# Patient Record
Sex: Female | Born: 1940 | Race: White | Hispanic: No | Smoking: Never smoker
Health system: Southern US, Community
[De-identification: ages and names within clinical notes are randomized; demographics above are authoritative.]

## PROBLEM LIST (undated history)

## (undated) DIAGNOSIS — R112 Nausea with vomiting, unspecified: Secondary | ICD-10-CM

## (undated) DIAGNOSIS — I639 Cerebral infarction, unspecified: Secondary | ICD-10-CM

## (undated) DIAGNOSIS — I6381 Other cerebral infarction due to occlusion or stenosis of small artery: Secondary | ICD-10-CM

## (undated) DIAGNOSIS — Z8739 Personal history of other diseases of the musculoskeletal system and connective tissue: Secondary | ICD-10-CM

## (undated) DIAGNOSIS — M8580 Other specified disorders of bone density and structure, unspecified site: Secondary | ICD-10-CM

## (undated) DIAGNOSIS — K222 Esophageal obstruction: Secondary | ICD-10-CM

## (undated) DIAGNOSIS — K219 Gastro-esophageal reflux disease without esophagitis: Secondary | ICD-10-CM

## (undated) DIAGNOSIS — F418 Other specified anxiety disorders: Secondary | ICD-10-CM

## (undated) DIAGNOSIS — K5790 Diverticulosis of intestine, part unspecified, without perforation or abscess without bleeding: Secondary | ICD-10-CM

## (undated) DIAGNOSIS — M533 Sacrococcygeal disorders, not elsewhere classified: Secondary | ICD-10-CM

## (undated) DIAGNOSIS — N393 Stress incontinence (female) (male): Secondary | ICD-10-CM

## (undated) DIAGNOSIS — E785 Hyperlipidemia, unspecified: Secondary | ICD-10-CM

## (undated) DIAGNOSIS — E119 Type 2 diabetes mellitus without complications: Secondary | ICD-10-CM

## (undated) DIAGNOSIS — I10 Essential (primary) hypertension: Secondary | ICD-10-CM

## (undated) DIAGNOSIS — Z9889 Other specified postprocedural states: Secondary | ICD-10-CM

## (undated) DIAGNOSIS — G8929 Other chronic pain: Secondary | ICD-10-CM

## (undated) DIAGNOSIS — K589 Irritable bowel syndrome without diarrhea: Secondary | ICD-10-CM

## (undated) DIAGNOSIS — K922 Gastrointestinal hemorrhage, unspecified: Secondary | ICD-10-CM

## (undated) HISTORY — DX: Other cerebral infarction due to occlusion or stenosis of small artery: I63.81

## (undated) HISTORY — DX: Other chronic pain: G89.29

## (undated) HISTORY — DX: Other specified anxiety disorders: F41.8

## (undated) HISTORY — DX: Hyperlipidemia, unspecified: E78.5

## (undated) HISTORY — DX: Other specified disorders of bone density and structure, unspecified site: M85.80

## (undated) HISTORY — DX: Personal history of other diseases of the musculoskeletal system and connective tissue: Z87.39

## (undated) HISTORY — DX: Gastro-esophageal reflux disease without esophagitis: K21.9

## (undated) HISTORY — DX: Essential (primary) hypertension: I10

## (undated) HISTORY — DX: Gastrointestinal hemorrhage, unspecified: K92.2

## (undated) HISTORY — PX: ABDOMINAL HYSTERECTOMY: SHX81

## (undated) HISTORY — DX: Esophageal obstruction: K22.2

## (undated) HISTORY — PX: COLONOSCOPY: SHX174

## (undated) HISTORY — DX: Stress incontinence (female) (male): N39.3

## (undated) HISTORY — PX: CARDIAC CATHETERIZATION: SHX172

## (undated) HISTORY — DX: Diverticulosis of intestine, part unspecified, without perforation or abscess without bleeding: K57.90

## (undated) HISTORY — DX: Type 2 diabetes mellitus without complications: E11.9

## (undated) HISTORY — DX: Sacrococcygeal disorders, not elsewhere classified: M53.3

---

## 1997-09-15 LAB — HM COLONOSCOPY

## 1998-08-10 ENCOUNTER — Inpatient Hospital Stay (HOSPITAL_COMMUNITY): Admission: EM | Admit: 1998-08-10 | Discharge: 1998-08-13 | Payer: Self-pay | Admitting: *Deleted

## 1998-08-28 ENCOUNTER — Encounter: Admission: RE | Admit: 1998-08-28 | Discharge: 1998-11-26 | Payer: Self-pay | Admitting: Internal Medicine

## 1999-12-31 ENCOUNTER — Encounter: Admission: RE | Admit: 1999-12-31 | Discharge: 1999-12-31 | Payer: Self-pay | Admitting: Obstetrics and Gynecology

## 1999-12-31 ENCOUNTER — Encounter: Payer: Self-pay | Admitting: Obstetrics and Gynecology

## 2000-01-03 ENCOUNTER — Encounter: Admission: RE | Admit: 2000-01-03 | Discharge: 2000-01-03 | Payer: Self-pay | Admitting: Obstetrics and Gynecology

## 2000-01-03 ENCOUNTER — Encounter: Payer: Self-pay | Admitting: Obstetrics and Gynecology

## 2000-08-19 ENCOUNTER — Other Ambulatory Visit: Admission: RE | Admit: 2000-08-19 | Discharge: 2000-08-19 | Payer: Self-pay | Admitting: Obstetrics and Gynecology

## 2001-01-05 ENCOUNTER — Encounter: Admission: RE | Admit: 2001-01-05 | Discharge: 2001-01-05 | Payer: Self-pay | Admitting: Internal Medicine

## 2001-01-05 ENCOUNTER — Encounter: Payer: Self-pay | Admitting: Internal Medicine

## 2002-01-19 ENCOUNTER — Encounter: Payer: Self-pay | Admitting: Internal Medicine

## 2002-01-19 ENCOUNTER — Encounter: Admission: RE | Admit: 2002-01-19 | Discharge: 2002-01-19 | Payer: Self-pay | Admitting: Internal Medicine

## 2003-02-03 ENCOUNTER — Other Ambulatory Visit: Admission: RE | Admit: 2003-02-03 | Discharge: 2003-02-03 | Payer: Self-pay | Admitting: Obstetrics and Gynecology

## 2003-04-19 ENCOUNTER — Encounter: Admission: RE | Admit: 2003-04-19 | Discharge: 2003-04-19 | Payer: Self-pay | Admitting: Internal Medicine

## 2003-04-19 ENCOUNTER — Encounter: Payer: Self-pay | Admitting: Internal Medicine

## 2004-02-16 ENCOUNTER — Emergency Department (HOSPITAL_COMMUNITY): Admission: EM | Admit: 2004-02-16 | Discharge: 2004-02-16 | Payer: Self-pay | Admitting: Emergency Medicine

## 2004-02-16 IMAGING — CR DG ANKLE COMPLETE 3+V*L*
3 series · 3 of 3 positions shown · non-contrast
Comparison: none

CLINICAL DATA: Status post fall with ankle pain.
 LEFT ANKLE, THREE VIEW, [DATE] 
 There is an oblique mildly comminuted fracture noted through the distal left fibula.  Fracture is also noted through the medial malleolus, which is mildly displaced.  Probable posterior malleolar fracture present as well.  
 IMPRESSION 
 Trimalleolar fracture.

[view not recorded (1 of 3)]
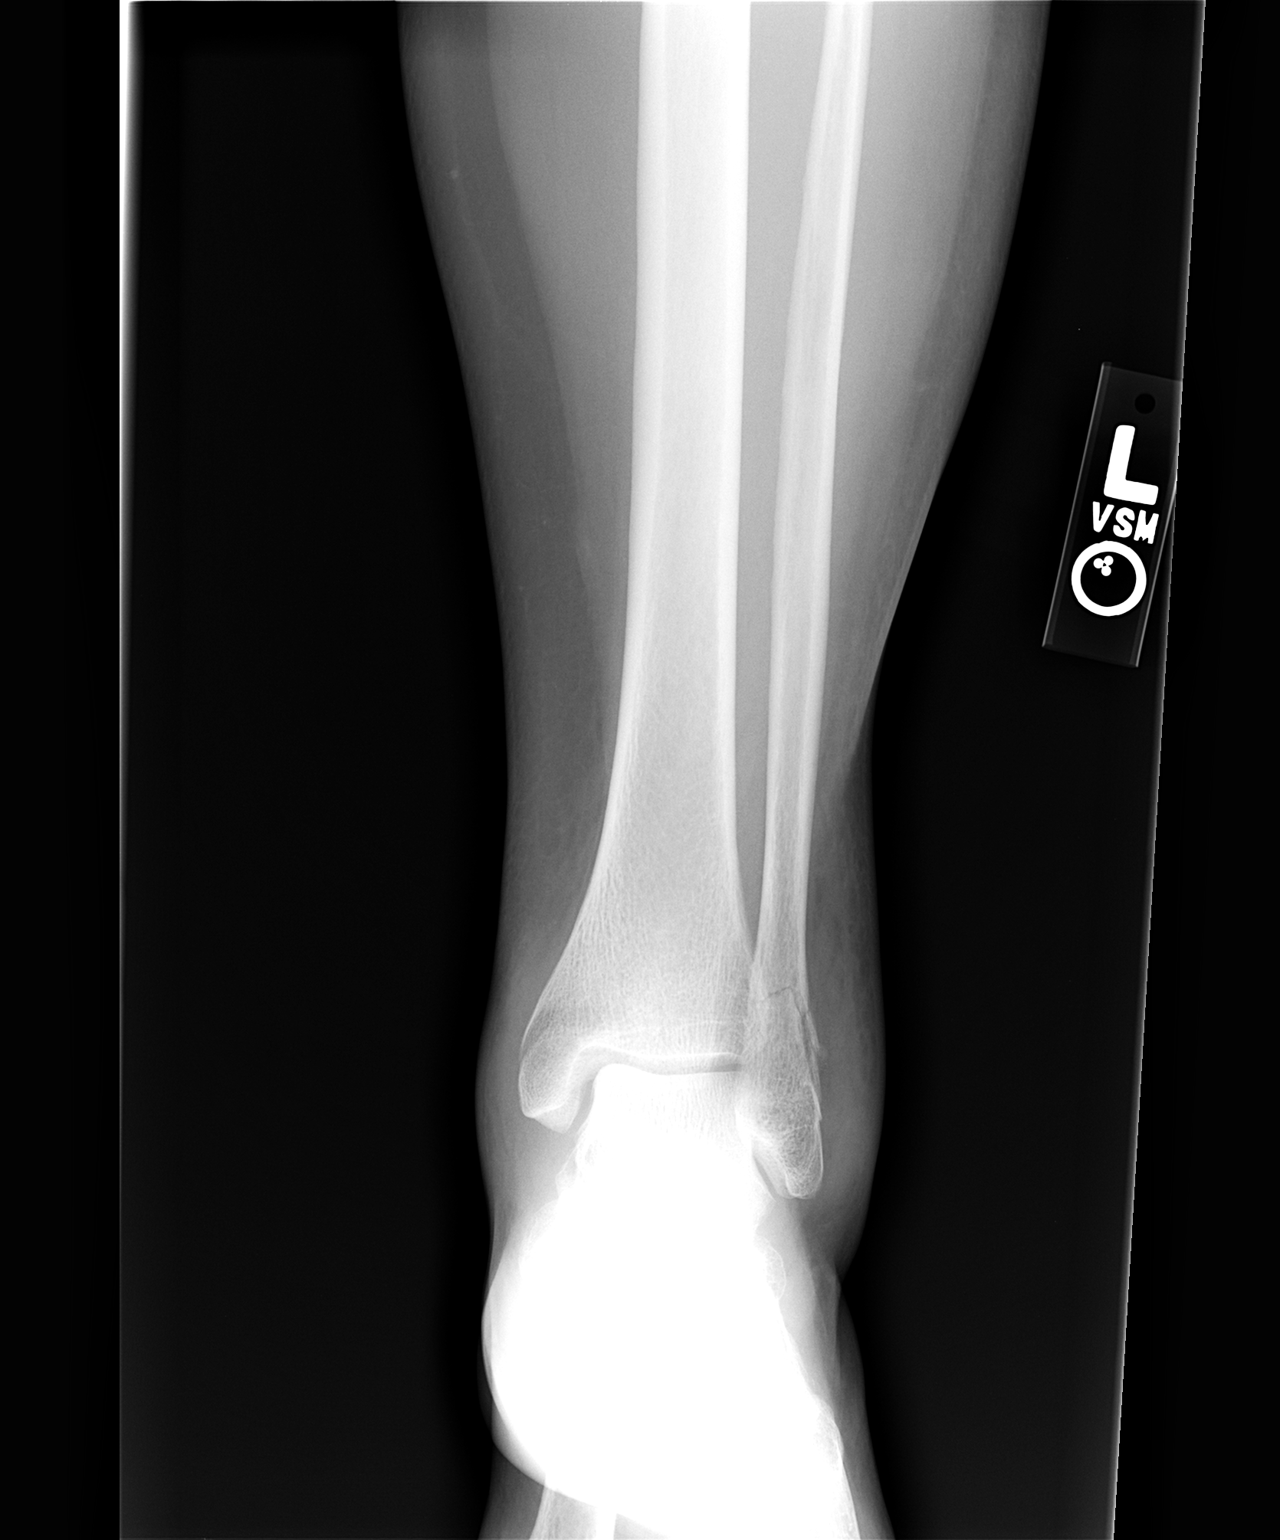

[view not recorded (2 of 3)]
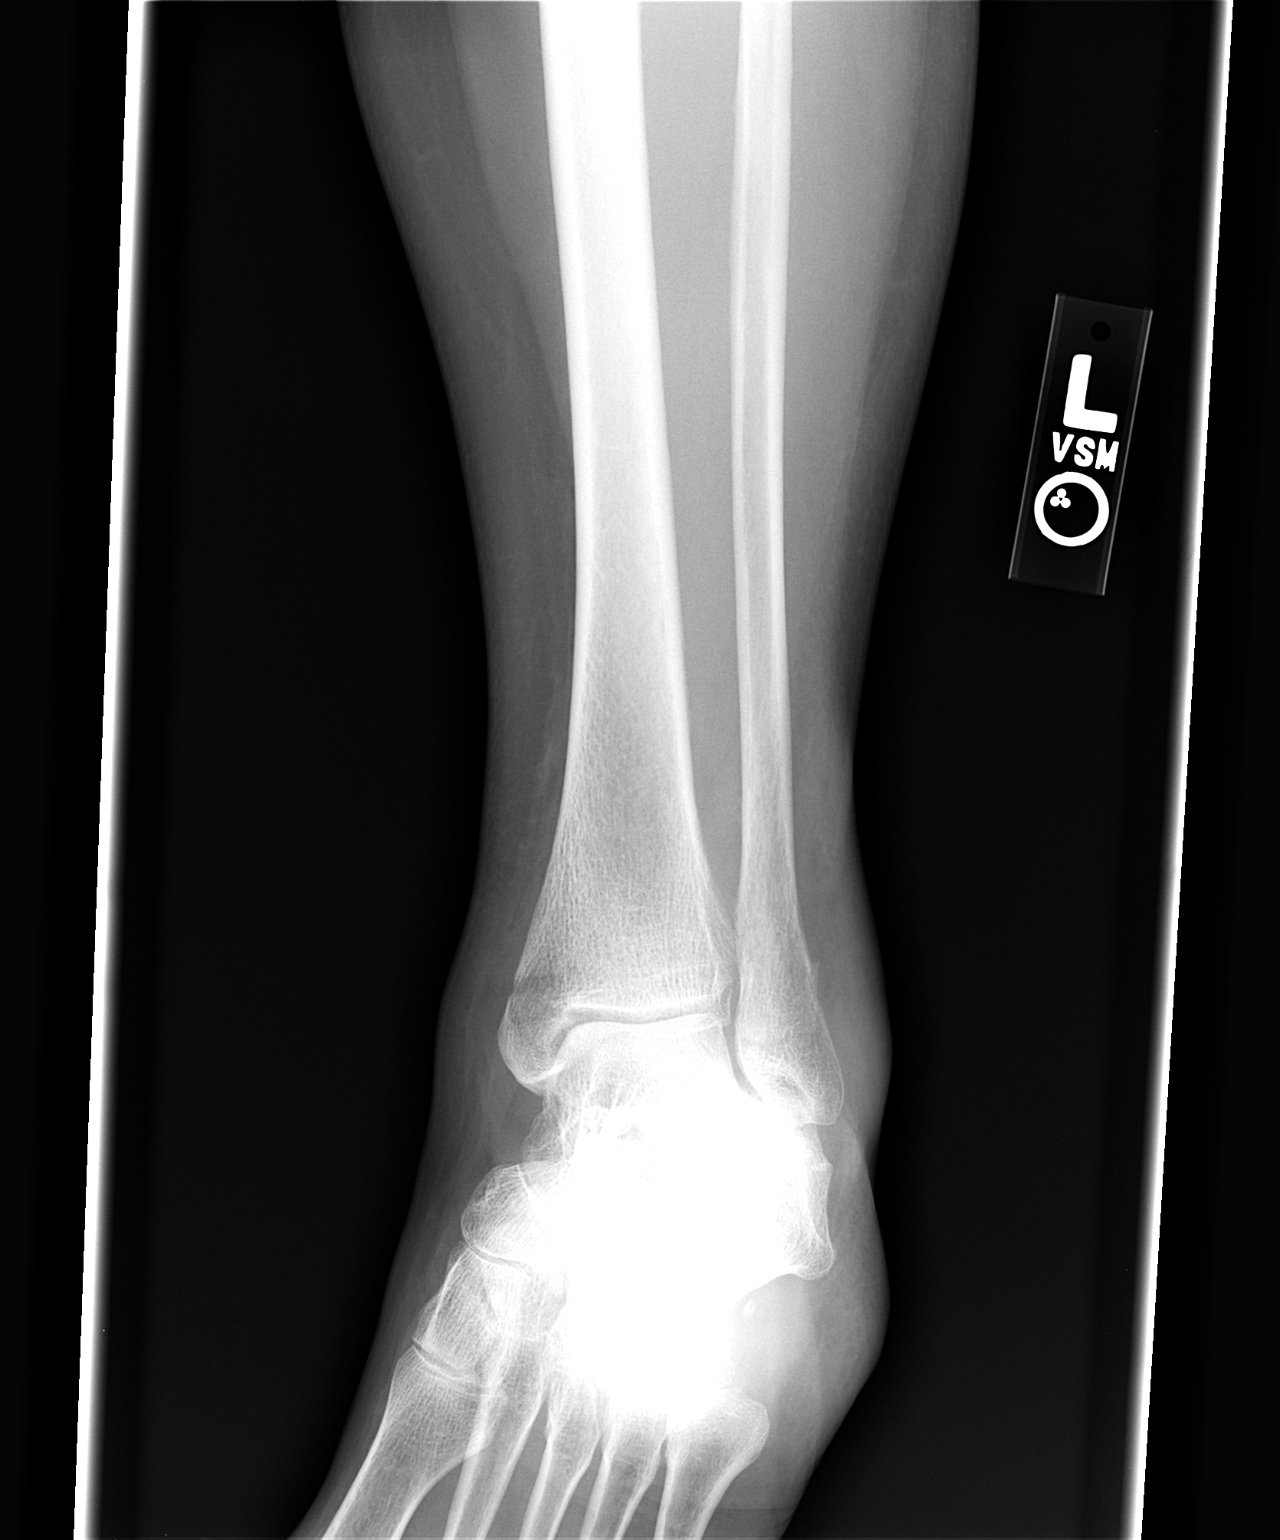

[view not recorded (3 of 3)]
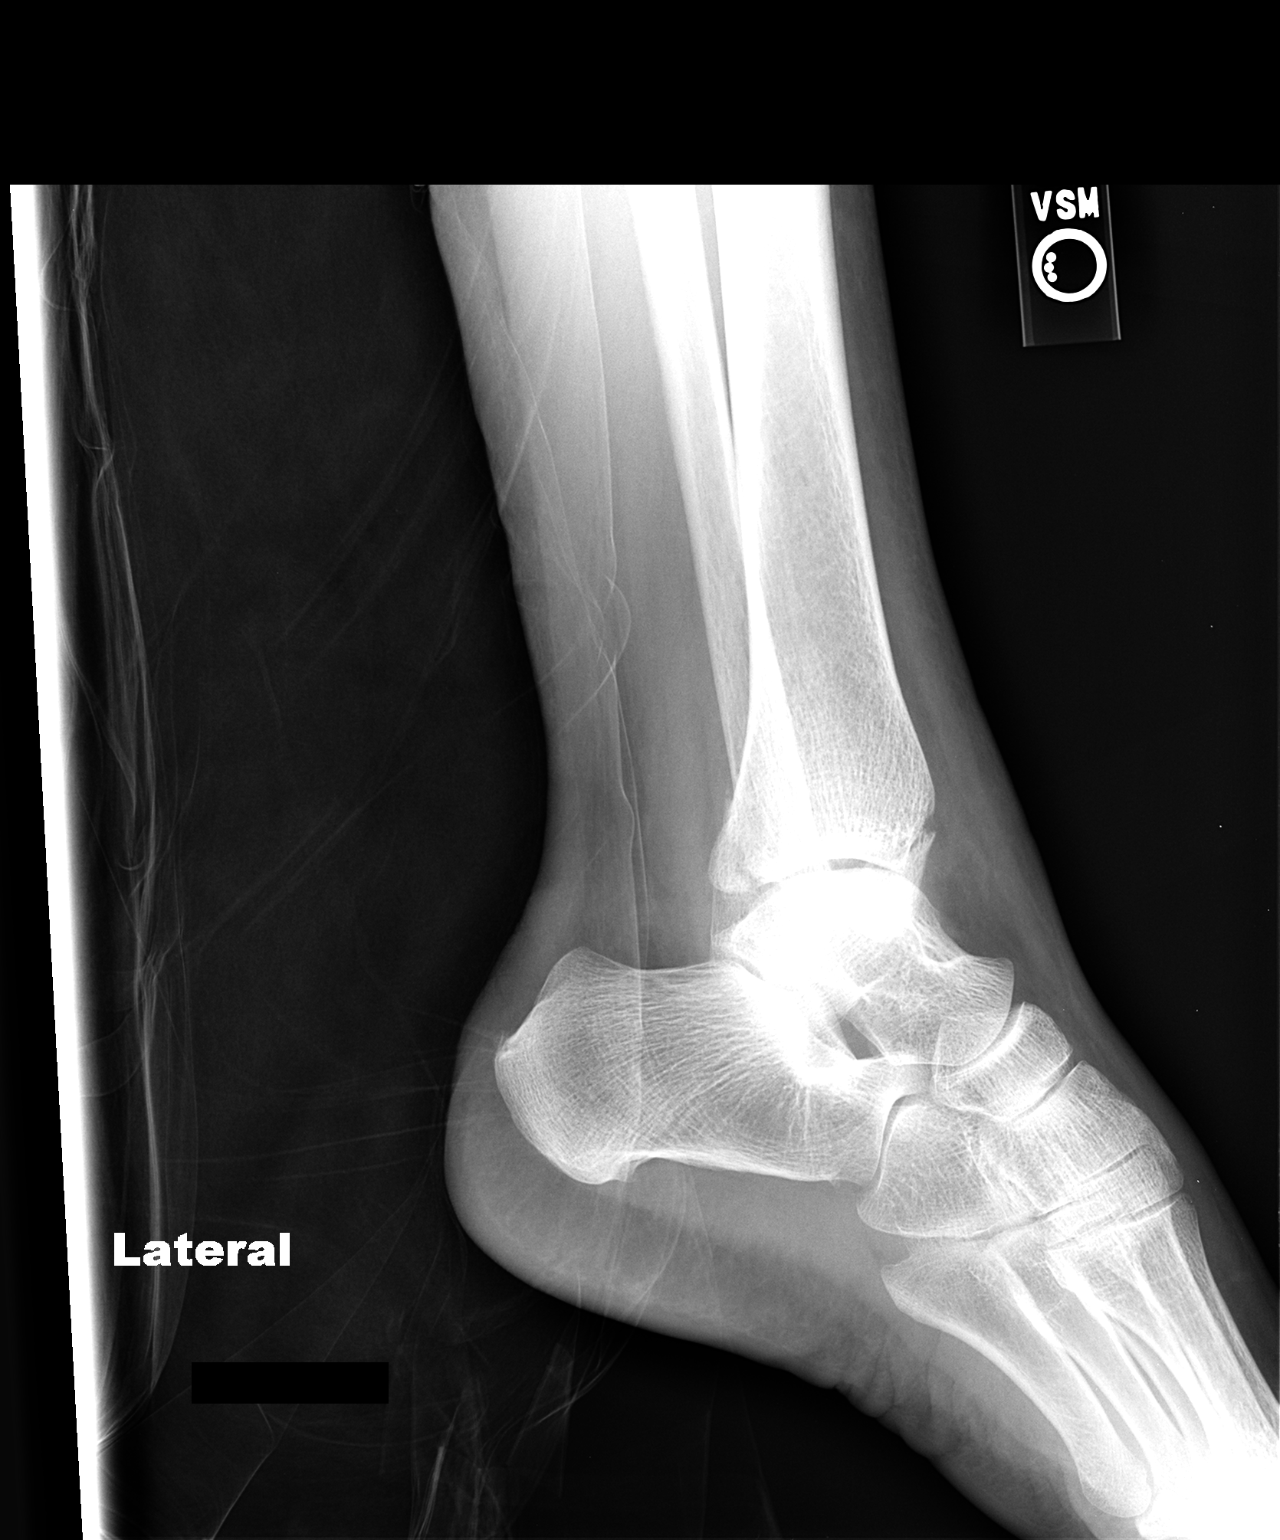

[3 of 3 positions shown; findings below may reference images not displayed]

## 2004-07-16 ENCOUNTER — Encounter: Admission: RE | Admit: 2004-07-16 | Discharge: 2004-07-16 | Payer: Self-pay | Admitting: Internal Medicine

## 2004-08-13 ENCOUNTER — Ambulatory Visit: Payer: Self-pay | Admitting: Internal Medicine

## 2004-09-12 ENCOUNTER — Ambulatory Visit: Payer: Self-pay | Admitting: Internal Medicine

## 2004-10-08 ENCOUNTER — Ambulatory Visit: Payer: Self-pay | Admitting: Internal Medicine

## 2005-04-10 ENCOUNTER — Ambulatory Visit: Payer: Self-pay | Admitting: Internal Medicine

## 2005-10-21 ENCOUNTER — Encounter: Admission: RE | Admit: 2005-10-21 | Discharge: 2005-10-21 | Payer: Self-pay | Admitting: Internal Medicine

## 2006-01-14 ENCOUNTER — Encounter: Payer: Self-pay | Admitting: *Deleted

## 2006-08-13 ENCOUNTER — Ambulatory Visit: Payer: Self-pay | Admitting: Internal Medicine

## 2006-12-10 ENCOUNTER — Ambulatory Visit: Payer: Self-pay | Admitting: Internal Medicine

## 2006-12-10 LAB — CONVERTED CEMR LAB
ALT: 25 units/L (ref 0–40)
Alkaline Phosphatase: 77 units/L (ref 39–117)
Basophils Absolute: 0.1 10*3/uL (ref 0.0–0.1)
Basophils Relative: 0.7 % (ref 0.0–1.0)
Bilirubin, Direct: 0.1 mg/dL (ref 0.0–0.3)
Calcium: 9.3 mg/dL (ref 8.4–10.5)
Eosinophils Relative: 3.3 % (ref 0.0–5.0)
GFR calc non Af Amer: 59 mL/min
HCT: 41.4 % (ref 36.0–46.0)
Hemoglobin: 14.1 g/dL (ref 12.0–15.0)
Hgb A1c MFr Bld: 6.3 % — ABNORMAL HIGH (ref 4.6–6.0)
MCHC: 34 g/dL (ref 30.0–36.0)
Neutro Abs: 8.8 10*3/uL — ABNORMAL HIGH (ref 1.4–7.7)
Platelets: 288 10*3/uL (ref 150–400)
Potassium: 3.3 meq/L — ABNORMAL LOW (ref 3.5–5.1)
RBC: 4.67 M/uL (ref 3.87–5.11)
Sodium: 138 meq/L (ref 135–145)
TSH: 1.98 microintl units/mL (ref 0.35–5.50)
Total Bilirubin: 0.9 mg/dL (ref 0.3–1.2)
Total Protein: 7.5 g/dL (ref 6.0–8.3)
WBC: 13.5 10*3/uL — ABNORMAL HIGH (ref 4.5–10.5)

## 2007-04-06 ENCOUNTER — Ambulatory Visit: Payer: Self-pay | Admitting: Internal Medicine

## 2007-04-06 LAB — CONVERTED CEMR LAB: Blood Glucose, Fingerstick: 146

## 2007-06-02 ENCOUNTER — Encounter: Admission: RE | Admit: 2007-06-02 | Discharge: 2007-06-02 | Payer: Self-pay | Admitting: Obstetrics and Gynecology

## 2007-06-10 ENCOUNTER — Ambulatory Visit: Payer: Self-pay | Admitting: Internal Medicine

## 2007-06-10 LAB — CONVERTED CEMR LAB
Cholesterol: 237 mg/dL (ref 0–200)
Direct LDL: 131.4 mg/dL

## 2007-07-29 ENCOUNTER — Ambulatory Visit: Payer: Self-pay | Admitting: Internal Medicine

## 2007-07-29 LAB — CONVERTED CEMR LAB
AST: 27 units/L (ref 0–37)
Cholesterol: 187 mg/dL (ref 0–200)

## 2007-11-09 ENCOUNTER — Ambulatory Visit: Payer: Self-pay | Admitting: Internal Medicine

## 2007-11-09 DIAGNOSIS — E119 Type 2 diabetes mellitus without complications: Secondary | ICD-10-CM

## 2007-11-09 DIAGNOSIS — I10 Essential (primary) hypertension: Secondary | ICD-10-CM | POA: Insufficient documentation

## 2007-11-09 DIAGNOSIS — J4520 Mild intermittent asthma, uncomplicated: Secondary | ICD-10-CM

## 2007-11-09 DIAGNOSIS — E785 Hyperlipidemia, unspecified: Secondary | ICD-10-CM | POA: Insufficient documentation

## 2007-11-09 DIAGNOSIS — E1129 Type 2 diabetes mellitus with other diabetic kidney complication: Secondary | ICD-10-CM | POA: Insufficient documentation

## 2007-11-09 HISTORY — DX: Mild intermittent asthma, uncomplicated: J45.20

## 2007-11-09 HISTORY — DX: Essential (primary) hypertension: I10

## 2007-11-09 HISTORY — DX: Type 2 diabetes mellitus without complications: E11.9

## 2007-11-09 LAB — CONVERTED CEMR LAB
AST: 22 units/L (ref 0–37)
Rapid Strep: NEGATIVE

## 2007-11-23 ENCOUNTER — Ambulatory Visit: Payer: Self-pay | Admitting: Internal Medicine

## 2008-03-28 ENCOUNTER — Ambulatory Visit: Payer: Self-pay | Admitting: Internal Medicine

## 2008-03-28 LAB — CONVERTED CEMR LAB: Hgb A1c MFr Bld: 6.5 % — ABNORMAL HIGH (ref 4.6–6.0)

## 2008-07-03 ENCOUNTER — Ambulatory Visit: Payer: Self-pay | Admitting: Internal Medicine

## 2008-08-23 ENCOUNTER — Ambulatory Visit: Payer: Self-pay | Admitting: Internal Medicine

## 2008-08-23 LAB — CONVERTED CEMR LAB
Ketones, urine, test strip: NEGATIVE
Urobilinogen, UA: NEGATIVE
WBC Urine, dipstick: NEGATIVE
pH: 7.5

## 2009-02-08 ENCOUNTER — Encounter: Payer: Self-pay | Admitting: Internal Medicine

## 2009-11-28 ENCOUNTER — Emergency Department (HOSPITAL_COMMUNITY): Admission: EM | Admit: 2009-11-28 | Discharge: 2009-11-28 | Payer: Self-pay | Admitting: Family Medicine

## 2009-11-28 ENCOUNTER — Ambulatory Visit: Payer: Self-pay | Admitting: Cardiology

## 2009-11-28 ENCOUNTER — Inpatient Hospital Stay (HOSPITAL_COMMUNITY): Admission: EM | Admit: 2009-11-28 | Discharge: 2009-11-30 | Payer: Self-pay | Admitting: Emergency Medicine

## 2009-11-28 IMAGING — CR DG CHEST 1V PORT
1 series · 1 of 1 positions shown · non-contrast
Comparison: None.

CLINICAL DATA: Chest pain

PORTABLE CHEST - 1 VIEW

[AP]
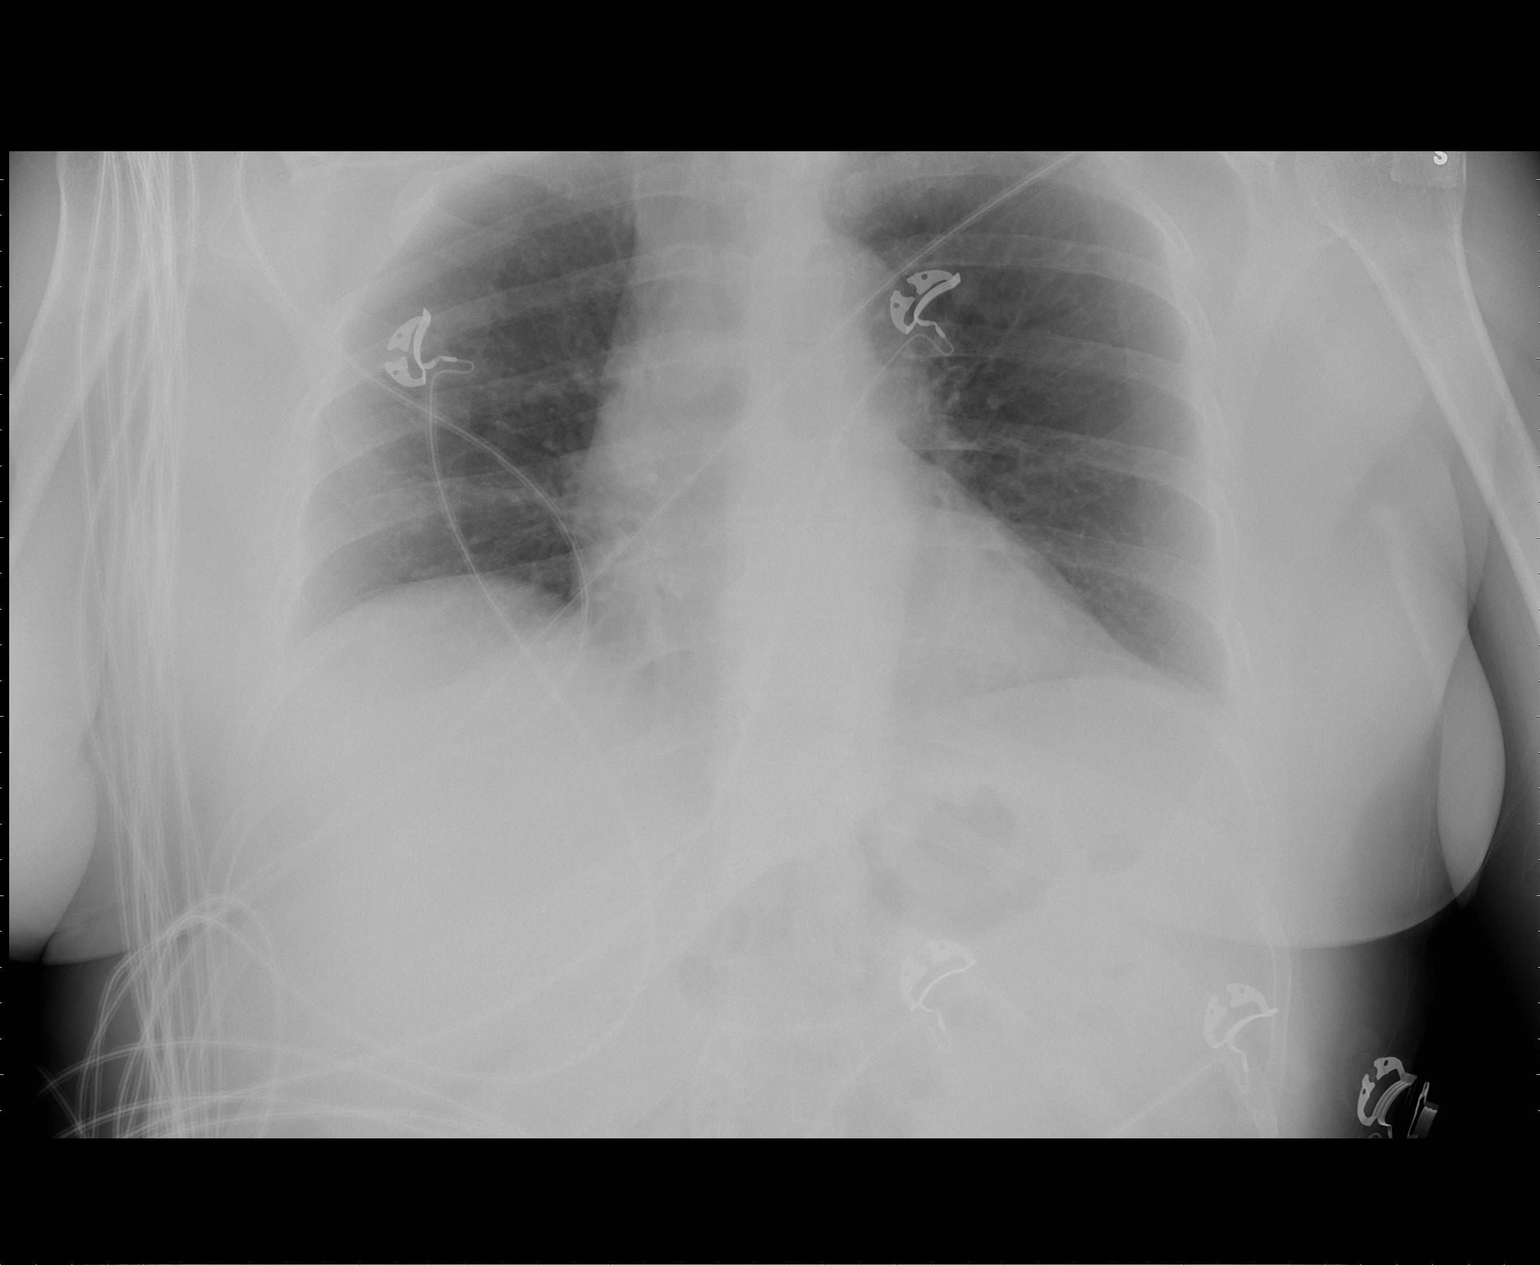

[1 of 1 positions shown; findings below may reference images not displayed]

FINDINGS: Normal mediastinum and cardiac silhouette.  Exam is
lordotic.  No evidence effusion, infiltrate, or pneumothorax
IMPRESSION: No acute cardiopulmonary process.

## 2009-11-28 IMAGING — CT CT ANGIO CHEST
2 of 7 series · 17 of 46 positions shown · IV contrast (APPLIED)
Comparison: Chest radiographs from the same day.

CTA CHEST

CLINICAL DATA: 68-year-old female with chest pain, shortness of
breath, diaphoresis, left-sided pain and weakness.

CT ANGIOGRAPHY CHEST, ABDOMEN AND PELVIS
TECHNIQUE: Multidetector CT imaging through the chest, abdomen and
pelvis was performed using the standard protocol during bolus
administration of intravenous contrast.  Multiplanar reconstructed
images including MIPs were obtained and reviewed to evaluate the
vascular anatomy.
Contrast: 100 ml Omnipaque 350.

[Series 5: dissection 2.0 st · axial · 0.66mm/px · z∈[+1179,+1723]mm · 14 of 308 slices shown]
[im 18/308  lung]
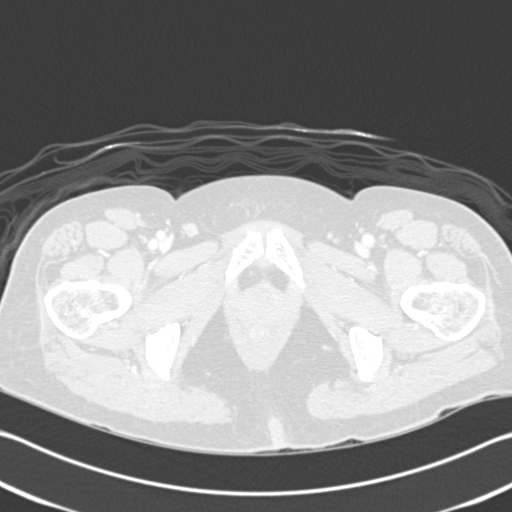
[im 35/308  soft-tissue]
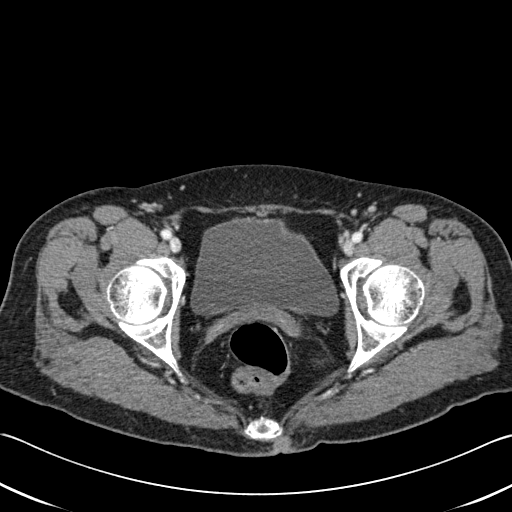
[im 69/308  lung]
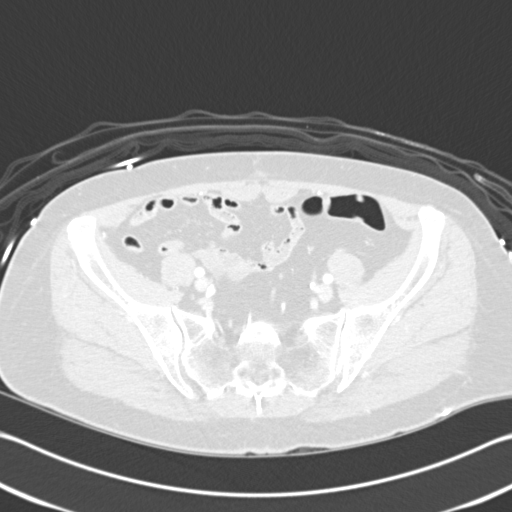
[im 86/308  soft-tissue]
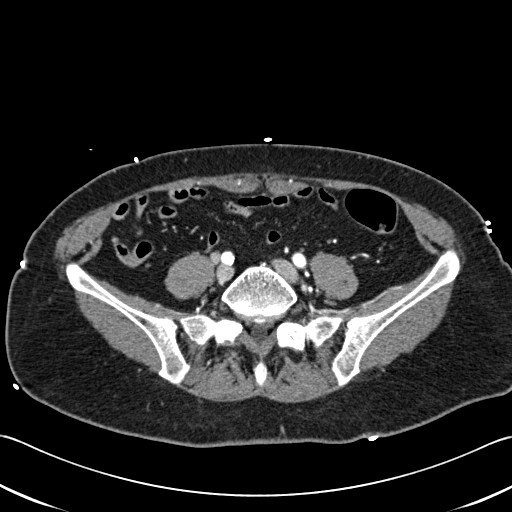
[im 103/308  lung]
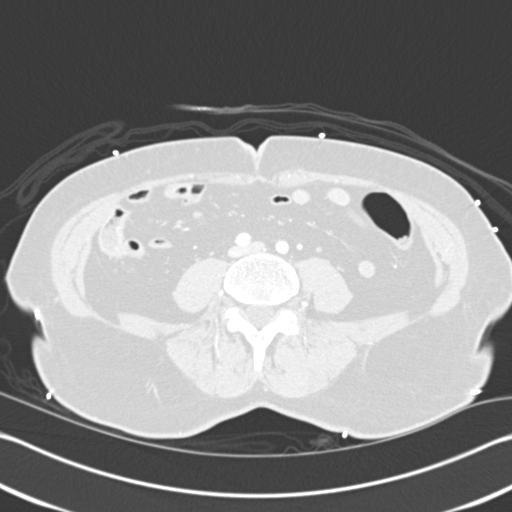
[im 120/308  soft-tissue]
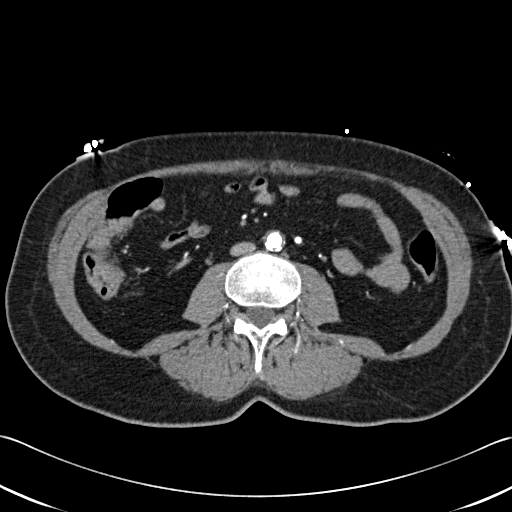
[im 137/308  lung]
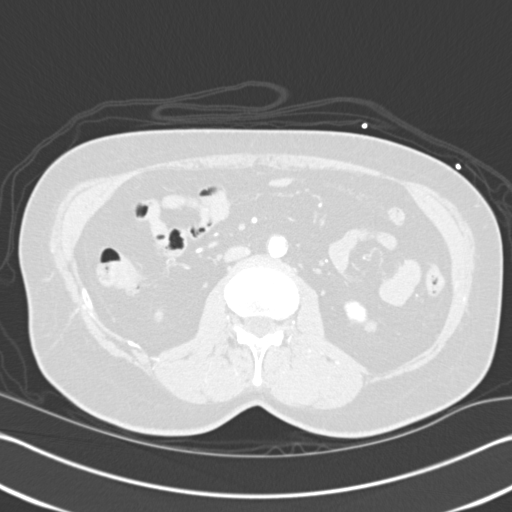
[im 171/308  soft-tissue]
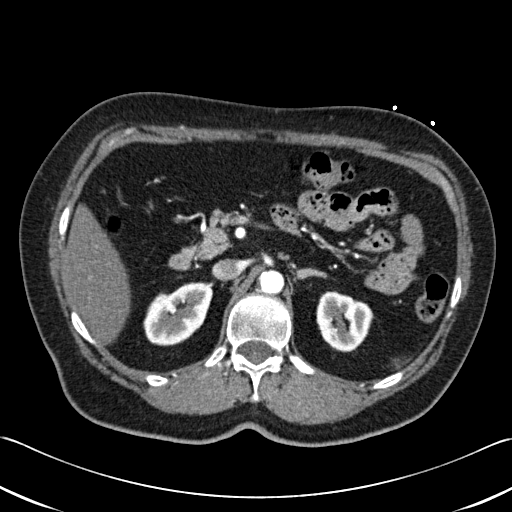
[im 188/308  lung]
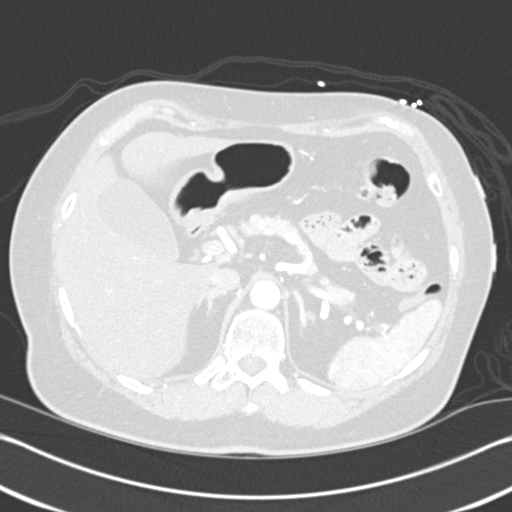
[im 205/308  soft-tissue]
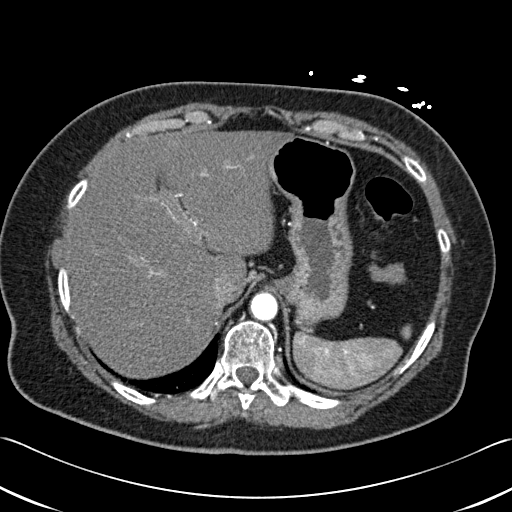
[im 222/308  lung]
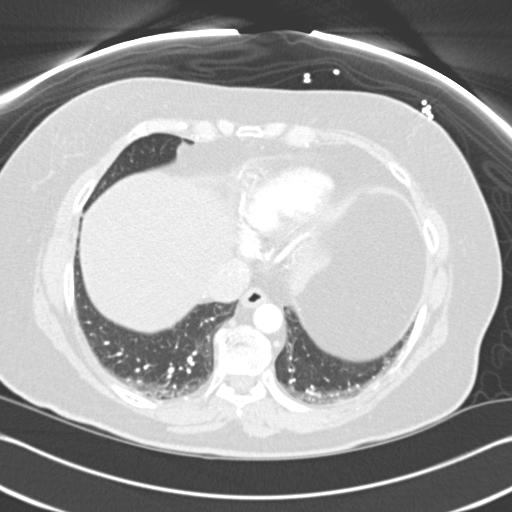
[im 239/308  soft-tissue]
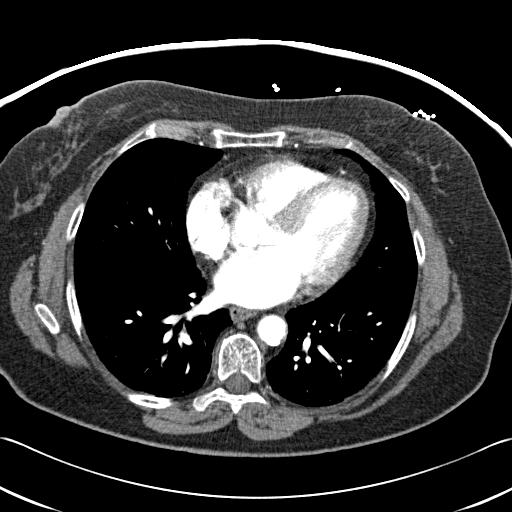
[im 273/308  lung]
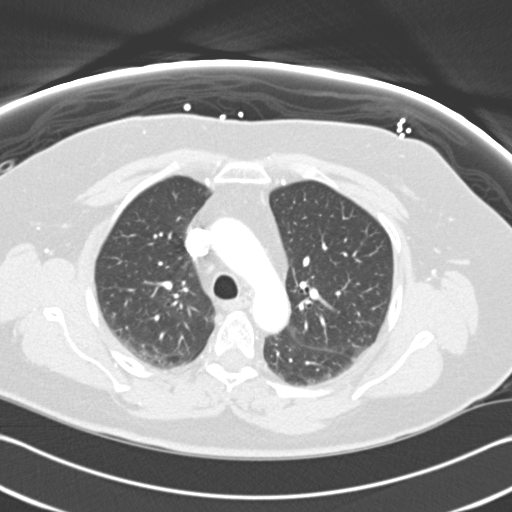
[im 290/308  soft-tissue]
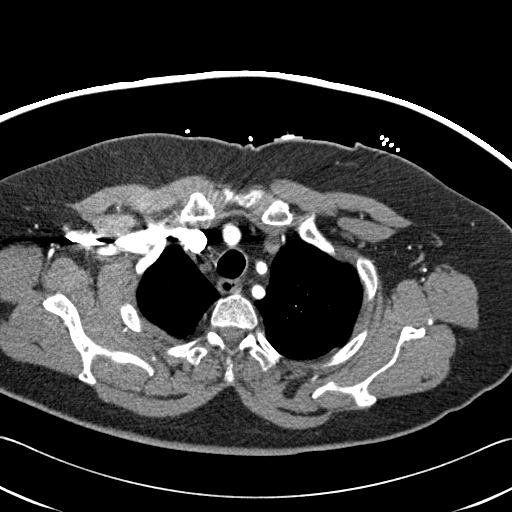

[Series 602: cor mpr · coronal · 1.20mm/px · 3 of 114 slices shown]
[im 29/114  soft-tissue]
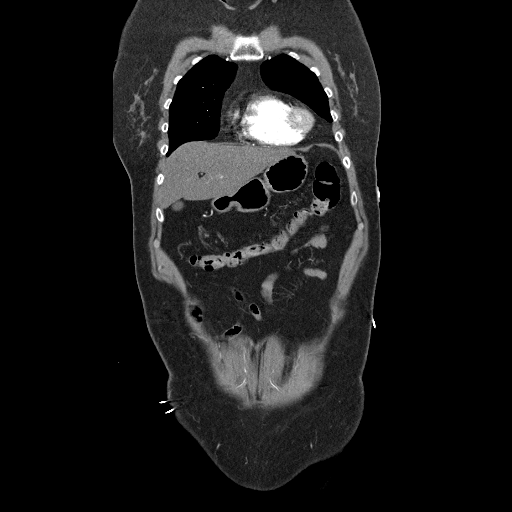
[im 57/114  soft-tissue]
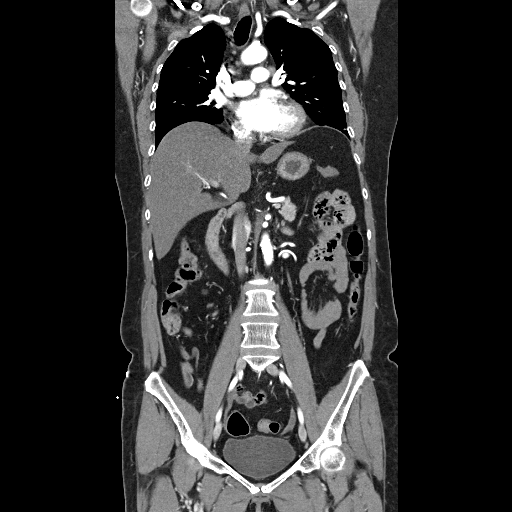
[im 85/114  soft-tissue]
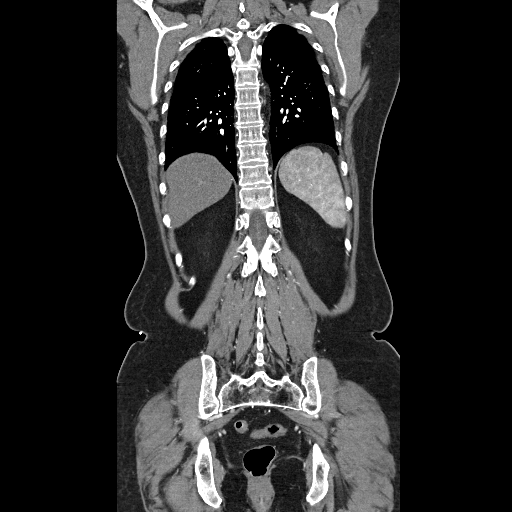

[17 of 46 positions shown; findings below may reference images not displayed]

FINDINGS: Major airways are patent.  There is bilateral dependent
atelectasis.  No airspace consolidation.  No pneumothorax, pleural
or pericardial effusion. No acute osseous abnormality identified.
Visualized thoracic inlet is within normal limits.  There is an
increase number of small mediastinal lymph nodes, most 7 mm in
short axis or less.  Increased bilateral hilar lymph nodes as well,
maximal on the right measuring up to 16 mm.  No axillary
lymphadenopathy.

Vascular Findings: The thoracic aorta is normal aside from
atherosclerosis with both soft and calcified plaque seen
predominately in the arch and descending segment.  Visualized great
vessels are patent and within normal limits.  There is also
adequate contrast timing in the pulmonary artery system.  No
pulmonary artery filling defect is identified.

 Review of the MIP images confirms the above findings.
IMPRESSION: 1.  Negative thoracic aorta aside from atherosclerosis. No evidence
of acute pulmonary embolus.
2.  Nonspecific mediastinal and hilar lymphadenopathy, maximal at
the right hilum.  No suspicious lung lesion is identified.
Differential considerations include inflammatory process such as
sarcoidosis, lymphoproliferative disorder, and less likely
metastatic disease.

CTA ABDOMEN
FINDINGS: No acute osseous abnormality identified.  Intermittent
diverticulosis of the colon, maximal at the hepatic flexure.  No
dilated small bowel.  Stomach and duodenum are within normal
limits.  Hepatic steatosis.  No focal liver lesion.  Gallbladder,
spleen, pancreas, and adrenal glands are within normal limits.  The
kidneys are normal for age with incidental simple right renal mid
pole cyst.  There are very small upper abdominal lymph nodes which
do not appear abnormally increased in number or size.  No free
fluid or focal inflammatory changes.

Vascular Findings: There is atherosclerosis of the abdominal aorta.
No dissection or aneurysmal enlargement.  Major branches including
the celiac, SMA, bilateral main renal arteries, and IMA are patent.
There is an accessory left renal artery.

 Review of the MIP images confirms the above findings.
IMPRESSION: 1.  Negative abdominal aorta aside from atherosclerosis.
2.  No lymphadenopathy or acute findings in the abdomen.

CTA PELVIS
FINDINGS: Distal large and small bowel are within normal limits.
Bladder is unremarkable.  Uterus is surgically absent.  Adnexa are
within normal limits.  No free fluid or lymphadenopathy. No acute
osseous abnormality identified.

Vascular Findings: Atherosclerosis at the aortoiliac bifurcation
continues into the iliac arteries but is less pronounced in the
external iliac arteries bilaterally.  There is atherosclerosis of
the right common femoral artery.  Visualized bilateral superficial
and deep femoral arteries are patent.

 Review of the MIP images confirms the above findings.
IMPRESSION: 1.  Negative visualized pelvic and femoral arteries aside from
atherosclerosis.
2.  No lymphadenopathy or acute findings in the pelvis.

## 2009-11-30 ENCOUNTER — Encounter: Payer: Self-pay | Admitting: Cardiology

## 2009-12-18 ENCOUNTER — Telehealth: Payer: Self-pay | Admitting: Internal Medicine

## 2009-12-19 ENCOUNTER — Ambulatory Visit: Payer: Self-pay | Admitting: Internal Medicine

## 2009-12-19 LAB — CONVERTED CEMR LAB
ALT: 25 units/L (ref 0–35)
Albumin: 4 g/dL (ref 3.5–5.2)
BUN: 26 mg/dL — ABNORMAL HIGH (ref 6–23)
Basophils Absolute: 0.1 10*3/uL (ref 0.0–0.1)
Basophils Relative: 1 % (ref 0.0–3.0)
Bilirubin, Direct: 0.1 mg/dL (ref 0.0–0.3)
Cholesterol: 159 mg/dL (ref 0–200)
Creatinine, Ser: 1.5 mg/dL — ABNORMAL HIGH (ref 0.4–1.2)
Eosinophils Absolute: 0.8 10*3/uL — ABNORMAL HIGH (ref 0.0–0.7)
Eosinophils Relative: 10.5 % — ABNORMAL HIGH (ref 0.0–5.0)
GFR calc non Af Amer: 36.74 mL/min (ref >60)
Glucose, Bld: 123 mg/dL — ABNORMAL HIGH (ref 70–99)
Leukocytes, UA: NEGATIVE
Lymphs Abs: 2.7 10*3/uL (ref 0.7–4.0)
MCHC: 34.1 g/dL (ref 30.0–36.0)
Monocytes Absolute: 0.6 10*3/uL (ref 0.1–1.0)
Monocytes Relative: 8.2 % (ref 3.0–12.0)
Nitrite: NEGATIVE
Platelets: 225 10*3/uL (ref 150.0–400.0)
Potassium: 3.5 meq/L (ref 3.5–5.1)
Sodium: 140 meq/L (ref 135–145)
Total CHOL/HDL Ratio: 3
Total Protein, Urine: NEGATIVE mg/dL
Triglycerides: 179 mg/dL — ABNORMAL HIGH (ref 0.0–149.0)
pH: 6.5 (ref 5.0–8.0)

## 2009-12-25 ENCOUNTER — Ambulatory Visit: Payer: Self-pay | Admitting: Internal Medicine

## 2009-12-25 LAB — HM DIABETES FOOT EXAM

## 2009-12-25 LAB — HM DIABETES EYE EXAM: HM Diabetic Eye Exam: NORMAL

## 2010-04-01 ENCOUNTER — Encounter (INDEPENDENT_AMBULATORY_CARE_PROVIDER_SITE_OTHER): Payer: Self-pay | Admitting: *Deleted

## 2010-06-26 ENCOUNTER — Ambulatory Visit: Payer: Self-pay | Admitting: Internal Medicine

## 2010-06-26 LAB — CONVERTED CEMR LAB
Albumin: 4.2 g/dL (ref 3.5–5.2)
BUN: 20 mg/dL (ref 6–23)
Basophils Absolute: 0 10*3/uL (ref 0.0–0.1)
Bilirubin, Direct: 0.1 mg/dL (ref 0.0–0.3)
Chloride: 102 meq/L (ref 96–112)
Creatinine, Ser: 1.1 mg/dL (ref 0.4–1.2)
Eosinophils Absolute: 0.4 10*3/uL (ref 0.0–0.7)
Eosinophils Relative: 6.7 % — ABNORMAL HIGH (ref 0.0–5.0)
GFR calc non Af Amer: 52.47 mL/min (ref >60)
Glucose, Bld: 151 mg/dL — ABNORMAL HIGH (ref 70–99)
Glucose, Urine, Semiquant: NEGATIVE
HCT: 40.9 % (ref 36.0–46.0)
Lymphocytes Relative: 34.1 % (ref 12.0–46.0)
Monocytes Absolute: 0.5 10*3/uL (ref 0.1–1.0)
Monocytes Relative: 8.3 % (ref 3.0–12.0)
Neutro Abs: 3.2 10*3/uL (ref 1.4–7.7)
Nitrite: NEGATIVE
Potassium: 4.4 meq/L (ref 3.5–5.1)
RDW: 13.9 % (ref 11.5–14.6)
Total Protein: 7.2 g/dL (ref 6.0–8.3)
WBC: 6.3 10*3/uL (ref 4.5–10.5)
pH: 5

## 2010-10-13 LAB — CONVERTED CEMR LAB: Blood Glucose, Fingerstick: 92

## 2010-10-15 NOTE — Assessment & Plan Note (Signed)
Summary: STOMACH ACHE AND BACK PAIN///SLM   Vital Signs:  Patient profile:   70 year old female Weight:      154 pounds Temp:     98.0 degrees F oral BP sitting:   122 / 84  (right arm) Cuff size:   regular  Vitals Entered By: Duard Brady LPN (June 26, 2010 9:49 AM) CC: c/o pain in lower abd apst urination but only at night     **declines flu vaccine Is Patient Diabetic? Yes CBG Result 323   CC:  c/o pain in lower abd apst urination but only at night     **declines flu vaccine.  History of Present Illness: 70 year old patient who has a history of type 2 diabetes that has been controlled in the past with low-dose metformin therapy.  She was last seen in the spring and she stopped checking blood sugars approximately 2 months later.  For the past two months.  She has had polyuria.  This includes significant nocturia that includes ten-  12 times per night.  That interferes with her sleep.  She has generally felt poor.  During the night.  She has lower abdominal discomfort that does not bother her during the day.  He denies any excessive thirst, blurred vision, or weight loss.  She is treated hypertension, and asthma, and dyslipidemia  Allergies: 1)  ! Codeine Phosphate (Codeine Phosphate)  Past History:  Past Medical History: Reviewed history from 11/09/2007 and no changes required. Diabetes mellitus, type II Hyperlipidemia Hypertension Asthma  Past Surgical History: Reviewed history from 12/25/2009 and no changes required. Hysterectomy heart catheterization, x 2, last in 1998 colonoscopy 1999 2 d echo 11-2009 CL Stress- 11-2009  Review of Systems       The patient complains of abdominal pain and muscle weakness.  The patient denies anorexia, fever, weight loss, weight gain, vision loss, decreased hearing, hoarseness, chest pain, syncope, dyspnea on exertion, peripheral edema, prolonged cough, headaches, hemoptysis, melena, hematochezia, severe indigestion/heartburn,  hematuria, incontinence, genital sores, suspicious skin lesions, transient blindness, difficulty walking, depression, unusual weight change, abnormal bleeding, enlarged lymph nodes, angioedema, and breast masses.    Physical Exam  General:  overweight-appearing.  160/100overweight-appearing.   Head:  Normocephalic and atraumatic without obvious abnormalities. No apparent alopecia or balding. Eyes:  No corneal or conjunctival inflammation noted. EOMI. Perrla. Funduscopic exam benign, without hemorrhages, exudates or papilledema. Vision grossly normal. Mouth:  Oral mucosa and oropharynx without lesions or exudates.  Teeth in good repair. Neck:  No deformities, masses, or tenderness noted. Lungs:  Normal respiratory effort, chest expands symmetrically. Lungs are clear to auscultation, no crackles or wheezes. Heart:  Normal rate and regular rhythm. S1 and S2 normal without gallop, murmur, click, rub or other extra sounds. Abdomen:  Bowel sounds positive,abdomen soft and non-tender without masses, organomegaly or hernias noted. Msk:  No deformity or scoliosis noted of thoracic or lumbar spine.   Pulses:  R and L carotid,radial,femoral,dorsalis pedis and posterior tibial pulses are full and equal bilaterally Extremities:  No clubbing, cyanosis, edema, or deformity noted with normal full range of motion of all joints.   Skin:  Intact without suspicious lesions or rashes Cervical Nodes:  No lymphadenopathy noted   Impression & Recommendations:  Problem # 1:  DIABETES MELLITUS, TYPE II, UNCONTROLLED (ICD-250.02)  Her updated medication list for this problem includes:    Metformin Hcl 500 Mg Tb24 (Metformin hcl) .Marland Kitchen... 2  once daily    Losartan Potassium-hctz 100-25 Mg Tabs (Losartan potassium-hctz) .Marland KitchenMarland KitchenMarland KitchenMarland Kitchen  One daily    Lantus Solostar 100 Unit/ml Soln (Insulin glargine) .Marland Kitchen... 20 units at bedtime    Her updated medication list for this problem includes:    Metformin Hcl 500 Mg Tb24 (Metformin hcl)  .Marland Kitchen... 2  once daily    Losartan Potassium-hctz 100-25 Mg Tabs (Losartan potassium-hctz) ..... One daily    Lantus Solostar 100 Unit/ml Soln (Insulin glargine) .Marland Kitchen... 20 units at bedtime  Orders: Venipuncture (21308) TLB-BMP (Basic Metabolic Panel-BMET) (80048-METABOL) TLB-CBC Platelet - w/Differential (85025-CBCD) TLB-Hepatic/Liver Function Pnl (80076-HEPATIC) TLB-A1C / Hgb A1C (Glycohemoglobin) (83036-A1C) Specimen Handling (65784)  Problem # 2:  ABDOMINAL PAIN, LOWER (ICD-789.09)  Orders: UA Dipstick W/ Micro (manual) (81000)  Problem # 3:  HYPERTENSION (ICD-401.9)  Her updated medication list for this problem includes:    Losartan Potassium-hctz 100-25 Mg Tabs (Losartan potassium-hctz) ..... One daily    Her updated medication list for this problem includes:    Losartan Potassium-hctz 100-25 Mg Tabs (Losartan potassium-hctz) ..... One daily  Orders: Venipuncture (69629) TLB-BMP (Basic Metabolic Panel-BMET) (80048-METABOL) TLB-CBC Platelet - w/Differential (85025-CBCD) TLB-Hepatic/Liver Function Pnl (80076-HEPATIC)  Problem # 4:  HYPERLIPIDEMIA (ICD-272.4)  Her updated medication list for this problem includes:    Simvastatin 40 Mg Tabs (Simvastatin) ..... One daily  Her updated medication list for this problem includes:    Simvastatin 40 Mg Tabs (Simvastatin) ..... One daily  Complete Medication List: 1)  Metformin Hcl 500 Mg Tb24 (Metformin hcl) .... 2  once daily 2)  Proair Hfa 108 (90 Base) Mcg/act Aers (Albuterol sulfate) .... Two puffs every 6 hours as needed for wheezing or shortness of breath 3)  Hyoscyamine Sulfate 0.125 Mg Subl (Hyoscyamine sulfate) .... One sublingually p.r.n. abdominal discomfort 4)  Famotidine 20 Mg Tabs (Famotidine) .... Qhs 5)  Simvastatin 40 Mg Tabs (Simvastatin) .... One daily 6)  Losartan Potassium-hctz 100-25 Mg Tabs (Losartan potassium-hctz) .... One daily 7)  Lorazepam 0.5 Mg Tabs (Lorazepam) .... One twice daily as needed for  stress 8)  Lantus Solostar 100 Unit/ml Soln (Insulin glargine) .... 20 units at bedtime 9)  Bd Disp Needles 30g X 1/2" Misc (Needle (disp))  Other Orders: Capillary Blood Glucose/CBG (52841)  Patient Instructions: 1)  Please schedule a follow-up appointment in 1 week 2)  increase Lantus insulin by 4 units every 3 days until fasting blood sugar is less than 150 3)  Limit your Sodium (Salt) to less than 2 grams a day(slightly less than 1/2 a teaspoon) to prevent fluid retention, swelling, or worsening of symptoms. 4)  Check your blood sugars regularly. If your readings are usually above : or below 70 you should contact our office. 5)  See your eye doctor yearly to check for diabetic eye damage.  Laboratory Results   Urine Tests  Date/Time Received: June 26, 2010 10:05 AM  Date/Time Reported: June 26, 2010 10:05 AM   Routine Urinalysis   Color: yellow Glucose: negative   (Normal Range: Negative) Bilirubin: negative   (Normal Range: Negative) Ketone: negative   (Normal Range: Negative) Spec. Gravity: 1.015   (Normal Range: 1.003-1.035) Blood: trace-intact   (Normal Range: Negative) pH: 5.0   (Normal Range: 5.0-8.0) Protein: trace   (Normal Range: Negative) Urobilinogen: 0.2   (Normal Range: 0-1) Nitrite: negative   (Normal Range: Negative) Leukocyte Esterace: negative   (Normal Range: Negative)     Blood Tests     CBG Random:: 323mg /dL     Appended Document: STOMACH ACHE AND BACK PAIN///SLM Medications Added ACCU-CHEK AVIVA  KIT (BLOOD GLUCOSE  MONITORING SUPPL)  ACCU-CHEK AVIVA  STRP (GLUCOSE BLOOD) check blood sugars two times a day and prn ACCU-CHEK SOFTCLIX LANCETS  MISC (LANCETS) check blood sugars two times a day and prn          Clinical Lists Changes  Medications: Added new medication of ACCU-CHEK AVIVA  KIT (BLOOD GLUCOSE MONITORING SUPPL) Added new medication of ACCU-CHEK AVIVA  STRP (GLUCOSE BLOOD) check blood sugars two times a day and prn -  Signed Added new medication of ACCU-CHEK SOFTCLIX LANCETS  MISC (LANCETS) check blood sugars two times a day and prn - Signed Rx of ACCU-CHEK AVIVA  STRP (GLUCOSE BLOOD) check blood sugars two times a day and prn;  #100 x 6;  Signed;  Entered by: Duard Brady LPN;  Authorized by: Gordy Savers  MD;  Method used: Electronically to [facility]  [id number] [address], [place], [place], [place]  [address], Ph: [phone] or [phone], Fax: [phone] Rx of ACCU-CHEK SOFTCLIX LANCETS  MISC (LANCETS) check blood sugars two times a day and prn;  #100 x 6;  Signed;  Entered by: Duard Brady LPN;  Authorized by: Gordy Savers  MD;  Method used: Electronically to [facility]  [id number] [address], [place], [place], [place]  [address], Ph: [phone] or [phone], Fax: [phone]    Prescriptions: ACCU-CHEK SOFTCLIX LANCETS  MISC (LANCETS) check blood sugars two times a day and prn  #100 x 6   Entered by:   Duard Brady LPN   Authorized by:   Gordy Savers  MD   Signed by:   Duard Brady LPN on 38/75/6433   Method used:   Electronically to        [facility]  [address] [id number] (retail)       [address]       [place], [place]  [address]       Ph: [phone] or [phone]       Fax: [phone]   RxID:   [id number] ACCU-CHEK AVIVA  STRP (GLUCOSE BLOOD) check blood sugars two times a day and prn  #100 x 6   Entered by:   Duard Brady LPN   Authorized by:   Gordy Savers  MD   Signed by:   Duard Brady LPN on 07/37/1062   Method used:   [facility]ronically to        [facility]  [address] [id number] (retail[facility]       [address]       [place], [place]  [address]       Ph: [phone] or [phone]       Fax: [phone]   RxID:   [id number]

## 2010-10-15 NOTE — Progress Notes (Signed)
Summary: Rx Refill   Phone Note Refill Request Message from:  Fax from Pharmacy on December 18, 2009 10:32 AM  Refills Requested: Medication #1:  DIOVAN HCT 320/25 MG  TABS (VALSARTAN-HYDROCHLOROTHIAZIDE) 1 once daily   Last Refilled: 10/26/2009 Please send  to [facility] [place], [place] [phone]   Method Requested: Electronic Initial call taken by: Trixie Dredge,  December 18, 2009 10:33 AM    Prescriptions: DIOVAN HCT 320/25 MG  TABS (VALSARTAN-HYDROCHLOROTHIAZIDE) 1 once daily  #30 x 1   Entered by:   Duard Brady LPN   Authorized by:   Gordy Savers  MD   Signed by:   Duard Brady LPN on 45/40/9811   Method used:   Faxed to ...       [facility]  [address] [id number] ([facility]       [address]       [place], [place]  [address]       Ph: [phone] or [phone]       Fax: [phone]   RxID:   [id number]

## 2010-10-15 NOTE — Letter (Signed)
Summary: Referral - not able to see patient  [facility]  [address] [place], [place] [address]   Phone: [phone]  Fax: [phone]    April 01, 2010    Gordy Savers, M.D. [address] [place], [place] [address]    Re:   Elizabeth Mcintyre DOB:  April 15, 1941 MRN:   [id number]    Dear Dr. Amador Cunas:  Thank you for your kind referral of the above patient.  We have attempted to schedule the recommended procedure Screening Colonoscopy but have not been able to schedule because:   X  The patient was not available by phone and/or has not returned our calls.  ___ The patient declined to schedule the procedure at this time.  We appreciate the referral and hope that we will have the opportunity to treat this patient in the future.    Sincerely,    [facility] [phone]

## 2010-10-15 NOTE — Assessment & Plan Note (Signed)
Summary: cpx/cjr   Vital Signs:  Patient profile:   70 year old female Height:      62 inches Weight:      150 pounds BMI:     27.53 Temp:     98.0 degrees F oral BP sitting:   108 / 60  (right arm) Cuff size:   regular  Vitals Entered By: Duard Brady LPN (December 25, 2009 2:53 PM) CC: cpx - doing ok , labs done , pap and mammo done 2009 both normal , colo more than 5 yrs ago - no polyps removed.  Is Patient Diabetic? Yes Did you bring your meter with you today? No CBG Result 92   CC:  cpx - doing ok , labs done , pap and mammo done 2009 both normal , and colo more than 5 yrs ago - no polyps removed. Marland Kitchen  History of Present Illness: 70 year old patient who is seen today for a comprehensive evaluation.  She was discharged from the hospital last month for evaluation of chest pain and hospital records were reviewed.  She has had no recurrent chest pain.  This was felt to be noncardiac.  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  She has asthma which has been stable.  She has hypertension, dyslipidemia, and type 2 diabetes.  She has not been seen here since 2009. Here for Medicare AWV:  1.   Risk factors based on Past M, S, F history:  multiple cardiac risk factors, including diabetes, hypertension, and dyslipidemia.  Recent negative cardiac assessment 2.   Physical Activities:  little physical activity 3.   Depression/mood: nonidentified 4.   Hearing: no deficits 5.   ADL's:  lives independently in widowed. 6.   Fall Risk: low 7.   Home Safety: no issues identified 8.   Height, weight, &visual acuity: visual acuity is 2/20 weight is stable.   9.   Counseling:  more rigorous exercise activities discussed; calcium, vitamin D supplementation encouraged 10.   Labs ordered based on risk factors:  hospital records and laboratory panel reviewed 11.           Referral Coordination- well and a follow-up colonoscopy 12.           Care Plan-  modest weight  loss and exercise regimen, colonoscopy; ingrown A1c every 3 months 13.           Cognitive Asses: alert and appropriate.  No issues identified  Allergies: 1)  ! Codeine Phosphate (Codeine Phosphate)  Past History:  Past Medical History: Reviewed history from 11/09/2007 and no changes required. Diabetes mellitus, type II Hyperlipidemia Hypertension Asthma  Past Surgical History: Hysterectomy heart catheterization, x 2, last in 1998 colonoscopy 1999 2 d echo 11-2009 CL Stress- 11-2009  Family History: Reviewed history from 11/09/2007 and no changes required. father died age 10- sudden death mother died age 97, coronary artery disease  One sister, age 73 in good health  Social History: Reviewed history from 11/09/2007 and no changes required. widowed 3 children, 5 grandchildren  Review of Systems  The patient denies anorexia, fever, weight loss, weight gain, vision loss, decreased hearing, hoarseness, chest pain, syncope, dyspnea on exertion, peripheral edema, prolonged cough, headaches, hemoptysis, abdominal pain, melena, hematochezia, severe indigestion/heartburn, hematuria, incontinence, genital sores, muscle weakness, suspicious skin lesions, transient blindness, difficulty walking, depression, unusual weight change, abnormal bleeding, enlarged lymph nodes, angioedema, and breast masses.    Physical Exam  General:  overweight-appearing.  112/72overweight-appearing.  Head:  Normocephalic and atraumatic without obvious abnormalities. No apparent alopecia or balding. Eyes:  No corneal or conjunctival inflammation noted. EOMI. Perrla. Funduscopic exam benign, without hemorrhages, exudates or papilledema. Vision grossly normal. Ears:  External ear exam shows no significant lesions or deformities.  Otoscopic examination reveals clear canals, tympanic membranes are intact bilaterally without bulging, retraction, inflammation or discharge. Hearing is grossly normal  bilaterally. Nose:  External nasal examination shows no deformity or inflammation. Nasal mucosa are pink and moist without lesions or exudates. Mouth:  Oral mucosa and oropharynx without lesions or exudates.  Teeth in good repair. Neck:  No deformities, masses, or tenderness noted. Chest Wall:  No deformities, masses, or tenderness noted. Breasts:  No mass, nodules, thickening, tenderness, bulging, retraction, inflamation, nipple discharge or skin changes noted.   Lungs:  Normal respiratory effort, chest expands symmetrically. Lungs are clear to auscultation, no crackles or wheezes. Heart:  Normal rate and regular rhythm. S1 and S2 normal without gallop, murmur, click, rub or other extra sounds. Abdomen:  Bowel sounds positive,abdomen soft and non-tender without masses, organomegaly or hernias noted. Msk:  No deformity or scoliosis noted of thoracic or lumbar spine.   Pulses:  R and L carotid,radial,femoral,dorsalis pedis and posterior tibial pulses are full and equal bilaterally Extremities:  No clubbing, cyanosis, edema, or deformity noted with normal full range of motion of all joints.   Neurologic:  alert & oriented X3, cranial nerves II-XII intact, strength normal in all extremities, gait normal, finger-to-nose normal, and heel-to-shin normal.  alert & oriented X3, cranial nerves II-XII intact, strength normal in all extremities, gait normal, finger-to-nose normal, and heel-to-shin normal.   Skin:  Intact without suspicious lesions or rashes Cervical Nodes:  No lymphadenopathy noted Axillary Nodes:  No palpable lymphadenopathy Inguinal Nodes:  No significant adenopathy Psych:  Cognition and judgment appear intact. Alert and cooperative with normal attention span and concentration. No apparent delusions, illusions, hallucinations  Diabetes Management Exam:    Foot Exam (with socks and/or shoes not present):       Sensory-Pinprick/Light touch:          Left medial foot (L-4): normal           Left dorsal foot (L-5): normal          Left lateral foot (S-1): normal          Right medial foot (L-4): normal          Right dorsal foot (L-5): normal          Right lateral foot (S-1): normal       Sensory-Monofilament:          Left foot: normal          Right foot: normal       Inspection:          Left foot: normal          Right foot: normal       Nails:          Left foot: normal          Right foot: normal    Foot Exam by Podiatrist:       Date: 12/25/2009       Results: no diabetic findings       Done by: PCP    Eye Exam:       Eye Exam done here today          Results: normal   Impression & Recommendations:  Problem #  1:  HYPERTENSION (ICD-401.9)  Her updated medication list for this problem includes:    Losartan Potassium-hctz 100-25 Mg Tabs (Losartan potassium-hctz) ..... One daily  Orders: EKG w/ Interpretation (93000)  Her updated medication list for this problem includes:    Losartan Potassium-hctz 100-25 Mg Tabs (Losartan potassium-hctz) ..... One daily  Problem # 2:  ASTHMA (ICD-493.90)  Her updated medication list for this problem includes:    Proair Hfa 108 (90 Base) Mcg/act Aers (Albuterol sulfate) .Marland Kitchen..Marland Kitchen Two puffs every 6 hours as needed for wheezing or shortness of breath  Her updated medication list for this problem includes:    Proair Hfa 108 (90 Base) Mcg/act Aers (Albuterol sulfate) .Marland Kitchen..Marland Kitchen Two puffs every 6 hours as needed for wheezing or shortness of breath  Problem # 3:  DIABETES MELLITUS, TYPE II (ICD-250.00)  Her updated medication list for this problem includes:    Metformin Hcl 500 Mg Tb24 (Metformin hcl) .Marland Kitchen... 1 once daily    Losartan Potassium-hctz 100-25 Mg Tabs (Losartan potassium-hctz) ..... One daily  Orders: Capillary Blood Glucose/CBG (91478)  Her updated medication list for this problem includes:    Metformin Hcl 500 Mg Tb24 (Metformin hcl) .Marland Kitchen... 1 once daily    Losartan Potassium-hctz 100-25 Mg Tabs (Losartan  potassium-hctz) ..... One daily  Problem # 4:  HYPERCHOLESTEROLEMIA, PURE (ICD-272.0)  The following medications were removed from the medication list:    Crestor 10 Mg Tabs (Rosuvastatin calcium) .Marland Kitchen... 1 once daily Her updated medication list for this problem includes:    Simvastatin 40 Mg Tabs (Simvastatin) ..... One daily  The following medications were removed from the medication list:    Crestor 10 Mg Tabs (Rosuvastatin calcium) .Marland Kitchen... 1 once daily Her updated medication list for this problem includes:    Simvastatin 40 Mg Tabs (Simvastatin) ..... One daily  Complete Medication List: 1)  Metformin Hcl 500 Mg Tb24 (Metformin hcl) .Marland Kitchen.. 1 once daily 2)  Proair Hfa 108 (90 Base) Mcg/act Aers (Albuterol sulfate) .... Two puffs every 6 hours as needed for wheezing or shortness of breath 3)  Hyoscyamine Sulfate 0.125 Mg Subl (Hyoscyamine sulfate) .... One sublingually p.r.n. abdominal discomfort 4)  Famotidine 20 Mg Tabs (Famotidine) .... Qhs 5)  Simvastatin 40 Mg Tabs (Simvastatin) .... One daily 6)  Losartan Potassium-hctz 100-25 Mg Tabs (Losartan potassium-hctz) .... One daily 7)  Lorazepam 0.5 Mg Tabs (Lorazepam) .... One twice daily as needed for stress  Other Orders: First annual wellness visit with prevention plan  (G9562) Gastroenterology Referral (GI)  Patient Instructions: 1)  Please schedule a follow-up appointment in 3 months. 2)  Limit your Sodium (Salt). 3)  It is important that you exercise regularly at least 20 minutes 5 times a week. If you develop chest pain, have severe difficulty breathing, or feel very tired , stop exercising immediately and seek medical attention. 4)  You need to lose weight. Consider a lower calorie diet and regular exercise.  5)  Take calcium +Vitamin D daily. 6)  Take an Aspirin every day. 7)  Check your blood sugars regularly. If your readings are usually above : or below 70 you should contact our office. 8)  It is important that your  Diabetic A1c level is checked every 3 months. 9)  See your eye doctor yearly to check for diabetic eye damage. Prescriptions: LORAZEPAM 0.5 MG TABS (LORAZEPAM) one twice daily as needed for stress  #50 x 2   Entered and Authorized by:   Gordy Savers  MD  Signed by:   Gordy Savers  MD on 12/25/2009   Method used:   Print then Give to Patient   RxID:   [id number] HYOSCYAMINE SULFATE 0.125 MG SUBL (HYOSCYAMINE SULFATE) one sublingually p.r.n. abdominal discomfort  #50 x 4   Entered and Authorized by:   Gordy Savers  MD   Signed by:   Gordy Savers  MD on 12/25/2009   Method used:   Print then Give to Patient   RxID:   [id number] LOSARTAN POTASSIUM-HCTZ 100-25 MG TABS (LOSARTAN POTASSIUM-HCTZ) one daily  #90 x 6   Entered and Authorized by:   Gordy Savers  MD   Signed by:   Gordy Savers  MD on 12/25/2009   Method used:   Print then Give to Patient   RxID:   [id number] SIMVASTATIN 40 MG TABS (SIMVASTATIN) one daily  #90 x 6   Entered and Authorized by:   Gordy Savers  MD   Signed by:   Gordy Savers  MD on 12/25/2009   Method used:   Print then Give to Patient   RxID:   [id number] FAMOTIDINE 20 MG TABS (FAMOTIDINE) qhs  #90 x 6   Entered and Authorized by:   Gordy Savers  MD   Signed by:   Gordy Savers  MD on 12/25/2009   Method used:   Print then Give to Patient   RxID:   [id number] PROAIR HFA 108 (90 BASE) MCG/ACT AERS (ALBUTEROL SULFATE) two puffs every 6 hours as needed for wheezing or shortness of breath  #one x 6   Entered and Authorized by:   Gordy Savers  MD   Signed by:   Gordy Savers  MD on 12/25/2009   Method used:   Print then Give to Patient   RxID:   [id number] METFORMIN HCL 500 MG TB24 (METFORMIN HCL) 1 once daily  #90 x 6   Entered and Authorized by:   Gordy Savers  MD   Signed by:   Gordy Savers  MD on 12/25/2009   Method used:   Print  then Give to Patient   RxID:   [id number]

## 2010-11-26 ENCOUNTER — Ambulatory Visit (INDEPENDENT_AMBULATORY_CARE_PROVIDER_SITE_OTHER): Payer: Medicare HMO | Admitting: Family Medicine

## 2010-11-26 ENCOUNTER — Encounter: Payer: Self-pay | Admitting: Family Medicine

## 2010-11-26 DIAGNOSIS — M542 Cervicalgia: Secondary | ICD-10-CM | POA: Insufficient documentation

## 2010-11-26 DIAGNOSIS — R52 Pain, unspecified: Secondary | ICD-10-CM

## 2010-11-26 DIAGNOSIS — R109 Unspecified abdominal pain: Secondary | ICD-10-CM

## 2010-11-26 DIAGNOSIS — I10 Essential (primary) hypertension: Secondary | ICD-10-CM

## 2010-11-26 DIAGNOSIS — E785 Hyperlipidemia, unspecified: Secondary | ICD-10-CM

## 2010-11-26 DIAGNOSIS — IMO0001 Reserved for inherently not codable concepts without codable children: Secondary | ICD-10-CM

## 2010-11-26 DIAGNOSIS — R319 Hematuria, unspecified: Secondary | ICD-10-CM | POA: Insufficient documentation

## 2010-11-26 DIAGNOSIS — J309 Allergic rhinitis, unspecified: Secondary | ICD-10-CM | POA: Insufficient documentation

## 2010-11-26 DIAGNOSIS — E119 Type 2 diabetes mellitus without complications: Secondary | ICD-10-CM

## 2010-11-26 DIAGNOSIS — R300 Dysuria: Secondary | ICD-10-CM

## 2010-11-26 DIAGNOSIS — M25559 Pain in unspecified hip: Secondary | ICD-10-CM | POA: Insufficient documentation

## 2010-11-26 LAB — CONVERTED CEMR LAB
Bilirubin Urine: NEGATIVE
Specific Gravity, Urine: 1.01
Urobilinogen, UA: 0.2

## 2010-11-27 ENCOUNTER — Encounter: Payer: Self-pay | Admitting: Family Medicine

## 2010-12-03 NOTE — Assessment & Plan Note (Signed)
Summary: new patient appt   Vital Signs:  Patient profile:   70 year old female Height:      62 inches (157.48 cm) Weight:      144.50 pounds (65.68 kg) BMI:     26.52 O2 Sat:      98 % on Room air Temp:     98.0 degrees F (36.67 degrees C) oral Pulse rate:   77 / minute BP sitting:   163 / 87  (right arm) Cuff size:   regular  Vitals Entered By: Josph Macho RMA (November 26, 2010 10:24 AM)  O2 Flow:  Room air  Serial Vital Signs/Assessments:  Time      Position  BP       Pulse  Resp  Temp     By                     140/86                         Danise Edge MD  CC: establish new patient/ CF Is Patient Diabetic? Yes   History of Present Illness: Patient is a  70 year old Caucasian female who is in today for new patient APPT.  she has a complicated medical history.  She is diabetic, hypertensive, hyperlipidemic.  is a long history of intermittent dysuria which has worsened recently over the last week. She chills or dysuria, and , urinary urgency , frequency, vaginal itching and irritation. She denies special discharge. Denies fevers, chills , change in bowels. Denies any bloody or tarry stool bowels move daily without difficulty. denies any hematuria bloody or tarry stool. Denies any chest pain, recent illness, palpitations, shortness of breath, headache. her diabetes she says is generally well-controlled glucose morning was 104 last night was 164. And these are typical numbers. [place] for colonoscopy is not due to be notified by Dr. Marian Sorrow office this time to proceed. She's not had a Pap smear mammogram in a couple of years.  Preventive Screening-Counseling & Management  Alcohol-Tobacco     Smoking Status: never  Caffeine-Diet-Exercise     Does Patient Exercise: no      Drug Use:  no.    Current Problems (verified): 1)  Hematuria Unspecified  (ICD-599.70) 2)  Hip Pain, Right  (ICD-719.45) 3)  Neck and Back Pain  (ICD-723.1) 4)  Allergic Rhinitis Cause Unspecified   (ICD-477.9) 5)  Dysuria  (ICD-788.1) 6)  Preventive Health Care  (ICD-V70.0) 7)  Abdominal Pain, Lower  (ICD-789.09) 8)  Encounter For Therapeutic Drug Monitoring  (ICD-V58.83) 9)  Asthma  (ICD-493.90) 10)  Hypertension  (ICD-401.9) 11)  Hyperlipidemia  (ICD-272.4) 12)  Diabetes Mellitus, Type II  (ICD-250.00)  Current Medications (verified): 1)  Metformin Hcl 500 Mg Tb24 (Metformin Hcl) .... 2  Once Daily 2)  Simvastatin 40 Mg Tabs (Simvastatin) .... One Daily 3)  Losartan Potassium-Hctz 100-25 Mg Tabs (Losartan Potassium-Hctz) .... One Daily 4)  Lorazepam 0.5 Mg Tabs (Lorazepam) .... One Twice Daily As Needed For Stress 5)  Bd Disp Needles 30g X 1/2" Misc (Needle (Disp)) 6)  Accu-Chek Aviva  Kit (Blood Glucose Monitoring Suppl) 7)  Accu-Chek Aviva  Strp (Glucose Blood) .... Check Blood Sugars Two Times A Day and Prn 8)  Accu-Chek Softclix Lancets  Misc (Lancets) .... Check Blood Sugars Two Times A Day and Prn  Allergies (verified): 1)  ! 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  The patient denies anorexia, fever, weight loss, weight gain, vision loss, decreased hearing, hoarseness, chest  pain, syncope, dyspnea on exertion, peripheral edema, prolonged cough, headaches, hemoptysis, abdominal pain, melena, hematochezia, severe indigestion/heartburn, hematuria, incontinence, genital sores, muscle weakness, suspicious skin lesions, transient blindness, difficulty walking, unusual weight change, abnormal bleeding, and enlarged lymph nodes.    Physical Exam  General:  Well-developed,well-nourished,in no acute distress; alert,appropriate and cooperative throughout examination Head:  Normocephalic and atraumatic without obvious abnormalities. No apparent alopecia or balding. Eyes:  No corneal or conjunctival inflammation, PERRLA, EOMI Ears:  External ear exam shows no significant lesions or deformities.  Otoscopic examination reveals clear canals, tympanic membranes are intact bilaterally without bulging, retraction, inflammation or discharge. Hearing is grossly normal bilaterally. Nose:  External nasal examination shows no deformity or inflammation. Nasal mucosa are pink and moist without lesions or exudates. Mouth:  Oral mucosa and oropharynx without lesions or exudates.  Teeth in good repair. Neck:  No deformities, masses, or tenderness noted. Lungs:  Normal respiratory effort, chest expands symmetrically. Lungs are clear to auscultation, no crackles or wheezes. Heart:  Normal rate and regular rhythm. S1 and S2 normal without gallop, murmur, click, rub or other extra sounds. Abdomen:  Bowel sounds positive,abdomen soft and non-tender without masses, organomegaly or hernias noted. Msk:  No deformity or scoliosis noted of thoracic or lumbar spine.   Pulses:  R and L carotid,radial,femoral,dorsalis pedis and posterior tibial pulses are full and equal bilaterally Extremities:  No clubbing, cyanosis, edema, or deformity noted with normal full range of motion of all joints.   Neurologic:  No cranial nerve deficits noted. Station and gait are normal. Plantar reflexes are down-going bilaterally.  DTRs are symmetrical throughout. Sensory, motor and coordinative functions appear intact. Skin:  Intact without suspicious lesions or rashes Cervical Nodes:  No lymphadenopathy noted Psych:  Cognition and judgment appear intact. Alert and cooperative with normal attention span and concentration. No apparent delusions, illusions, hallucinations   Impression & Recommendations:  Problem # 1:  DYSURIA (ICD-788.1)  Her updated medication list for this problem includes:    Cipro 250 Mg Tabs (Ciprofloxacin hcl) .Marland Kitchen... 1 tab by mouth two times a day x 3 day   Increase clear fluids,m start a probiotic, avoid harsh soaps, cotton undergarments  Orders: T-Culture, Urine ([id number])  Problem # 2:  HIP PAIN, RIGHT (ICD-719.45) Patient with multiple joint pains, consider OA Consider starting Glucosamine Chondroitin and fish oil daily, call if symptoms worsen  Problem # 3:  HYPERTENSION (ICD-401.9)  Her updated medication list for this problem includes:    Losartan Potassium-hctz 100-25 Mg Tabs (Losartan potassium-hctz) ..... One daily Improved on repeat and patient with low numbers at home so no changes at this time  Problem # 4:  ASTHMA (ICD-493.90)  The following medications were removed from  the medication list:    Proair Hfa 108 (90 Base) Mcg/act Aers (Albuterol sulfate) .Marland Kitchen..Marland Kitchen Two puffs every 6 hours as needed for wheezing or shortness of breath mild, intermittent no need for Albuterol in over a year  Problem # 5:  ALLERGIC RHINITIS CAUSE UNSPECIFIED (ICD-477.9) no recent trouble, has a distant history of allergy shots  Problem # 7:  HYPERLIPIDEMIA (ICD-272.4)  Her updated medication list for this problem includes:    Simvastatin 40 Mg Tabs (Simvastatin) ..... One daily Avoid trans fats and consider fish oil caps daily  Her updated medication list for this problem includes:    Simvastatin 40 Mg Tabs (Simvastatin) ..... One daily  Complete Medication List: 1)  Metformin Hcl 500  Mg Tb24 (Metformin hcl) .... 2  once daily 2)  Simvastatin 40 Mg Tabs (Simvastatin) .... One daily 3)  Losartan Potassium-hctz 100-25 Mg Tabs (Losartan potassium-hctz) .... One daily 4)  Lorazepam 0.5 Mg Tabs (Lorazepam) .... One twice daily as needed for stress 5)  Bd Disp Needles 30g X 1/2" Misc (Needle (disp)) 6)  Accu-chek Aviva Kit (Blood glucose monitoring suppl) 7)  Accu-chek Aviva Strp (Glucose blood) .... Check blood sugars two times a day and prn 8)  Accu-chek Lancets Misc (lancets)  .... Check blood sugars two times a day and prn 9)  Cipro 250 Mg Tabs (Ciprofloxacin hcl) .Marland Kitchen.. 1 tab by mouth two times a day x 3 day  Patient Instructions: 1)  Please schedule a follow-up appointment in 1 month when lab is ready, shoes off at next visit 2)  Needs FLP, liver, renal, cbc tsh, hgba1c, UA, vitamin d, urine microalb 3)  Call Dr Juanda Chance for colonoscopy 4)  MGM at [facility] as ordered 5)  Check your blood sugars regularly. If your readings are usually above:  or below 70 you should contact our office.  6)  See your eye doctor yearly to check for diabetic eye damage. 7)  Check your feet each night  for sore areas, calluses or signs of infection.  8)  Try the Dash Diet 9)  Avoid harsh soaps, blow dry after shower, cotton undergarments 10)  Consider eating a yogurt daily and/or a probiotic such as Align caps daily 11)  64 oz of clear fluids daily Prescriptions: ACCU-CHEK  LANCETS  MISC (LANCETS) check blood sugars two times a day and prn  #1 month x 3   Entered by:   Josph Macho RMA   Authorized by:   Danise Edge MD   Signed by:   Josph Macho RMA on 11/26/2010   Method used:   Faxed to ...       [facility]  [address] [address] [id number] (retail)       [address]       [place], [place]  [address]       Ph: [phone] or [phone]       Fax: [phone]   RxID:   [id number] CIPRO 250 MG TABS (CIPROFLOXACIN HCL) 1 tab by mouth two times a day x 3 day  #6  x 0   Entered and Authorized by:   Danise Edge MD   Signed by:   Danise Edge MD on 11/26/2010   Method used:   Electronically to        [facility]  [address] [id number] (retail)       [address]       [place], [place]  [address]       Ph: [phone] or [phone]       Fax: [phone]   RxID:   [id number]    Orders Added: 1)  T-Culture, Urine [[id number]] 2)  Est. Patient Level IV [[address]]    Laboratory Results   Urine Tests  Date/Time Received: November 26, 2010 12:02 PM Date/Time Reported: November 26, 2010 12:02 PM Francee Piccolo CMA Duncan Dull)  November 26, 2010 12:02 PM   Routine Urinalysis   Color: yellow Appearance: Clear Glucose: negative   (Normal Range: Negative) Bilirubin: negative   (Normal Range: Negative) Ketone: negative   (Normal Range: Negative) Spec. Gravity: 1.010   (Normal Range: 1.003-1.035) Blood: trace-intact   (Normal Range: Negative) pH: 6.5   (Normal Range: 5.0-8.0) Protein: trace   (Normal Range: Negative) Urobilinogen: 0.2   (Normal Range: 0-1) Nitrite: negative   (Normal Range: Negative) Leukocyte Esterace: negative   (Normal Range: Negative)       Appended Document: new patient appt light growth given her symptoms would treat empirically with Augmentin 875mg  by mouth two times a day x 3 days  Appended Document: new patient appt look at above note

## 2010-12-09 LAB — COMPREHENSIVE METABOLIC PANEL
ALT: 16 U/L (ref 0–35)
Alkaline Phosphatase: 77 U/L (ref 39–117)
CO2: 27 mEq/L (ref 19–32)
GFR calc non Af Amer: 45 mL/min — ABNORMAL LOW (ref >60)
Glucose, Bld: 151 mg/dL — ABNORMAL HIGH (ref 70–99)
Potassium: 4 mEq/L (ref 3.5–5.1)
Sodium: 133 mEq/L — ABNORMAL LOW (ref 135–145)

## 2010-12-09 LAB — CARDIAC PANEL(CRET KIN+CKTOT+MB+TROPI)
CK, MB: 2.3 ng/mL (ref 0.3–4.0)
Troponin I: 0.01 ng/mL (ref 0.00–0.06)
Troponin I: 0.05 ng/mL (ref 0.00–0.06)

## 2010-12-09 LAB — CBC
HCT: 38.9 % (ref 36.0–46.0)
HCT: 41.8 % (ref 36.0–46.0)
Hemoglobin: 13.1 g/dL (ref 12.0–15.0)
Hemoglobin: 13.1 g/dL (ref 12.0–15.0)
Hemoglobin: 13.8 g/dL (ref 12.0–15.0)
Hemoglobin: 14.4 g/dL (ref 12.0–15.0)
MCHC: 33.8 g/dL (ref 30.0–36.0)
MCHC: 34.5 g/dL (ref 30.0–36.0)
MCV: 89.5 fL (ref 78.0–100.0)
MCV: 90.6 fL (ref 78.0–100.0)
RBC: 4.3 MIL/uL (ref 3.87–5.11)
RBC: 4.39 MIL/uL (ref 3.87–5.11)
RBC: 4.67 MIL/uL (ref 3.87–5.11)
RDW: 13.1 % (ref 11.5–15.5)
RDW: 13.3 % (ref 11.5–15.5)

## 2010-12-09 LAB — HEMOGLOBIN A1C
Hgb A1c MFr Bld: 6.1 % (ref 4.6–6.1)
Mean Plasma Glucose: 128 mg/dL

## 2010-12-09 LAB — GLUCOSE, CAPILLARY
Glucose-Capillary: 148 mg/dL — ABNORMAL HIGH (ref 70–99)
Glucose-Capillary: 151 mg/dL — ABNORMAL HIGH (ref 70–99)
Glucose-Capillary: 181 mg/dL — ABNORMAL HIGH (ref 70–99)

## 2010-12-09 LAB — LIPID PANEL
LDL Cholesterol: 197 mg/dL — ABNORMAL HIGH (ref 0–99)
Total CHOL/HDL Ratio: 5.5 RATIO
VLDL: 36 mg/dL (ref 0–40)

## 2010-12-09 LAB — TROPONIN I: Troponin I: 0.02 ng/mL (ref 0.00–0.06)

## 2010-12-09 LAB — BASIC METABOLIC PANEL
BUN: 13 mg/dL (ref 6–23)
CO2: 25 mEq/L (ref 19–32)
Calcium: 9.3 mg/dL (ref 8.4–10.5)
Creatinine, Ser: 1.13 mg/dL (ref 0.4–1.2)
Glucose, Bld: 105 mg/dL — ABNORMAL HIGH (ref 70–99)

## 2010-12-09 LAB — DIFFERENTIAL
Basophils Relative: 1 % (ref 0–1)
Eosinophils Relative: 8 % — ABNORMAL HIGH (ref 0–5)
Monocytes Absolute: 0.9 10*3/uL (ref 0.1–1.0)
Monocytes Relative: 9 % (ref 3–12)
Neutro Abs: 4.3 10*3/uL (ref 1.7–7.7)

## 2010-12-09 LAB — HEPARIN LEVEL (UNFRACTIONATED)
Heparin Unfractionated: 0.48 IU/mL (ref 0.30–0.70)
Heparin Unfractionated: 0.55 IU/mL (ref 0.30–0.70)
Heparin Unfractionated: 0.55 IU/mL (ref 0.30–0.70)

## 2010-12-09 LAB — POCT I-STAT, CHEM 8
BUN: 17 mg/dL (ref 6–23)
Calcium, Ion: 1.15 mmol/L (ref 1.12–1.32)
Creatinine, Ser: 1.2 mg/dL (ref 0.4–1.2)
Hemoglobin: 14.3 g/dL (ref 12.0–15.0)
TCO2: 30 mmol/L (ref 0–100)

## 2010-12-09 LAB — CK TOTAL AND CKMB (NOT AT ARMC)
CK, MB: 2.7 ng/mL (ref 0.3–4.0)
Relative Index: 1.4 (ref 0.0–2.5)
Total CK: 190 U/L — ABNORMAL HIGH (ref 7–177)

## 2010-12-09 LAB — PROTIME-INR
INR: 0.93 (ref 0.00–1.49)
Prothrombin Time: 12.4 seconds (ref 11.6–15.2)

## 2010-12-26 ENCOUNTER — Other Ambulatory Visit: Payer: Self-pay | Admitting: Internal Medicine

## 2010-12-26 NOTE — Telephone Encounter (Signed)
Dr. Sherald Barge pt now

## 2010-12-27 ENCOUNTER — Encounter: Payer: Self-pay | Admitting: Family Medicine

## 2010-12-27 ENCOUNTER — Other Ambulatory Visit (INDEPENDENT_AMBULATORY_CARE_PROVIDER_SITE_OTHER): Payer: Medicare HMO

## 2010-12-27 DIAGNOSIS — E119 Type 2 diabetes mellitus without complications: Secondary | ICD-10-CM

## 2010-12-27 DIAGNOSIS — Z0000 Encounter for general adult medical examination without abnormal findings: Secondary | ICD-10-CM

## 2010-12-27 LAB — RENAL FUNCTION PANEL
Albumin: 4.3 g/dL (ref 3.5–5.2)
BUN: 17 mg/dL (ref 6–23)
Creatinine, Ser: 1.2 mg/dL (ref 0.4–1.2)
Glucose, Bld: 102 mg/dL — ABNORMAL HIGH (ref 70–99)
Phosphorus: 3.5 mg/dL (ref 2.3–4.6)
Potassium: 3.8 mEq/L (ref 3.5–5.1)

## 2010-12-27 LAB — HEPATIC FUNCTION PANEL
ALT: 24 U/L (ref 0–35)
Albumin: 4.3 g/dL (ref 3.5–5.2)
Alkaline Phosphatase: 51 U/L (ref 39–117)
Total Protein: 7.3 g/dL (ref 6.0–8.3)

## 2010-12-27 LAB — POCT URINALYSIS DIPSTICK
Bilirubin, UA: NEGATIVE
Blood, UA: NEGATIVE
Ketones, UA: NEGATIVE
Spec Grav, UA: 1.05
pH, UA: 7

## 2010-12-27 LAB — LIPID PANEL
Cholesterol: 198 mg/dL (ref 0–200)
VLDL: 51.4 mg/dL — ABNORMAL HIGH (ref 0.0–40.0)

## 2010-12-28 LAB — VITAMIN D 25 HYDROXY (VIT D DEFICIENCY, FRACTURES): Vit D, 25-Hydroxy: 35 ng/mL (ref 30–89)

## 2010-12-28 LAB — CBC
HCT: 40.9 % (ref 36.0–46.0)
Hemoglobin: 14.1 g/dL (ref 12.0–15.0)
MCH: 29.3 pg (ref 26.0–34.0)
MCHC: 34.5 g/dL (ref 30.0–36.0)
MCV: 84.9 fL (ref 78.0–100.0)
Platelets: 258 10*3/uL (ref 150–400)
RBC: 4.82 MIL/uL (ref 3.87–5.11)
RDW: 13.3 % (ref 11.5–15.5)
WBC: 7.5 10*3/uL (ref 4.0–10.5)

## 2010-12-31 ENCOUNTER — Encounter: Payer: Self-pay | Admitting: Family Medicine

## 2010-12-31 ENCOUNTER — Ambulatory Visit (INDEPENDENT_AMBULATORY_CARE_PROVIDER_SITE_OTHER): Payer: Medicare HMO | Admitting: Family Medicine

## 2010-12-31 DIAGNOSIS — J309 Allergic rhinitis, unspecified: Secondary | ICD-10-CM

## 2010-12-31 DIAGNOSIS — E119 Type 2 diabetes mellitus without complications: Secondary | ICD-10-CM

## 2010-12-31 DIAGNOSIS — J069 Acute upper respiratory infection, unspecified: Secondary | ICD-10-CM

## 2010-12-31 DIAGNOSIS — N390 Urinary tract infection, site not specified: Secondary | ICD-10-CM

## 2010-12-31 DIAGNOSIS — J45909 Unspecified asthma, uncomplicated: Secondary | ICD-10-CM

## 2010-12-31 DIAGNOSIS — R319 Hematuria, unspecified: Secondary | ICD-10-CM

## 2010-12-31 DIAGNOSIS — I10 Essential (primary) hypertension: Secondary | ICD-10-CM

## 2010-12-31 DIAGNOSIS — M542 Cervicalgia: Secondary | ICD-10-CM

## 2010-12-31 DIAGNOSIS — F411 Generalized anxiety disorder: Secondary | ICD-10-CM

## 2010-12-31 DIAGNOSIS — M25559 Pain in unspecified hip: Secondary | ICD-10-CM

## 2010-12-31 DIAGNOSIS — F419 Anxiety disorder, unspecified: Secondary | ICD-10-CM

## 2010-12-31 DIAGNOSIS — N760 Acute vaginitis: Secondary | ICD-10-CM

## 2010-12-31 DIAGNOSIS — E785 Hyperlipidemia, unspecified: Secondary | ICD-10-CM

## 2010-12-31 LAB — POCT URINALYSIS DIPSTICK
Bilirubin, UA: NEGATIVE
Glucose, UA: NEGATIVE
Ketones, UA: NEGATIVE
Leukocytes, UA: NEGATIVE
Spec Grav, UA: 1.05

## 2010-12-31 MED ORDER — LORAZEPAM 0.5 MG PO TABS: 0.5000 mg | ORAL_TABLET | Freq: Two times a day (BID) | ORAL | Status: DC | PRN

## 2010-12-31 MED ORDER — CIPROFLOXACIN HCL 250 MG PO TABS: ORAL_TABLET | ORAL | Status: DC

## 2010-12-31 MED ORDER — LORATADINE 10 MG PO TABS: 10.0000 mg | ORAL_TABLET | Freq: Every day | ORAL | Status: DC

## 2010-12-31 MED ORDER — METFORMIN HCL 500 MG PO TABS: 500.0000 mg | ORAL_TABLET | Freq: Two times a day (BID) | ORAL | Status: DC

## 2010-12-31 MED ORDER — LOSARTAN POTASSIUM-HCTZ 100-25 MG PO TABS: 1.0000 | ORAL_TABLET | Freq: Every day | ORAL | Status: DC

## 2010-12-31 MED ORDER — GUAIFENESIN ER 600 MG PO TB12: 600.0000 mg | ORAL_TABLET | Freq: Two times a day (BID) | ORAL | Status: DC

## 2010-12-31 MED ORDER — FLUCONAZOLE 150 MG PO TABS: 150.0000 mg | ORAL_TABLET | Freq: Once | ORAL | Status: AC

## 2010-12-31 NOTE — Patient Instructions (Signed)
Common Cold, Adult An upper respiratory tract infection, or cold, is a viral infection of the air passages to the lung. Colds are contagious, especially during the first 3 or 4 days. Antibiotics cannot cure a cold. Cold germs are spread by coughs, sneezes, and hand to hand contact. A respiratory tract infection usually clears up in a few days, but some people may be sick for a week or two. HOME CARE INSTRUCTIONS  Only take over-the-counter or prescription medicines for pain, discomfort, or fever as directed by your caregiver.   Be careful not to blow your nose too hard. This may cause a nosebleed.   Use a cool-mist humidifier (vaporizer) to increase air moisture. This will make it easier for you to breath. Do not use hot steam.   Rest as much as possible and get plenty of sleep.   Wash your hands often, especially after you blow your nose. Cover your mouth and nose with a tissue when you sneeze or cough.   Drink at least 8 glasses of clear liquids every day, such as water, fruit juices, tea, clear soups, and carbonated beverages.  SEEK MEDICAL CARE IF:  An oral temperature above 155f lasts 4 days or more, and is not controlled by medication.   You have a sore throat that gets worse or you see white or yellow spots in your throat.   Your cough gets worse or lasts more than 10 days.   You have a rash somewhere on your skin. You have large and tender lumps in your neck.   You have an earache or a headache.   You have thick, greenish or yellowish discharge from your nose.   You cough-up thick yellow, green, gray or bloody mucus (secretions).  SEEK IMMEDIATE MEDICAL CARE IF: You have trouble breathing, chest pain, or your skin or nails look gray or blue. MAKE SURE YOU:   Understand these instructions.   Will watch your condition.   Will get help right away if you are not doing well or get worse.  Document Released: 08/29/2000 Document Re-Released: 08/14/2008 [facility] Patient  Information 2011 [place], [place].  Avoid OTC cough and cold preparations unless it specifically says it is safe for Blood pressure  Try nasal saline as needed for nasal congestion  Try moist heat and gentle stretching for neck pain  For cholesterol start 2 fish oil or flaxseed oil caps daily and a fiber supplement such as Benefiber as directed daily  Avoid trans fats in baked goods, frozen foods and creamy, breaded or cheesy foods

## 2011-01-01 ENCOUNTER — Encounter: Payer: Self-pay | Admitting: Family Medicine

## 2011-01-01 NOTE — Assessment & Plan Note (Signed)
At last visit patient symptomatic and treated with Cipro for 3 days, her symptoms improved until just the past couple of days when they began to recur with less intensity. Will recheck urine and treat with a longer course of ciprofloxacin

## 2011-01-01 NOTE — Assessment & Plan Note (Signed)
Improved somewhat at this visit. Continue to remain active and try moist heat and gentle stretching prn

## 2011-01-01 NOTE — Progress Notes (Signed)
Elizabeth Mcintyre [id number] 07/15/1941 01/01/2011      Progress Note-Follow Up  Subjective  Chief Complaint  Chief Complaint  Patient presents with  . Follow-up    HPI  Patient is a 70 year old female in today for followup. She has been struggling with URI symptoms for 7-10 days. Nasal congestion, facial pain,  Especially in the left, malaise, myalgias. There is some mild sputum production with some clear to greenish sputum at times she has used a mouth ulcer plus with some temporary relief. No chest pains, palpitations, wheezing, shortness of breath. Unfortunately she's developed some mild vaginal irritation itching and urinary frequency adjust the dose. At her last visit she was having back pain abdominal pain dysuria and hematuria we treated with 3 days of Cipro the symptoms resolved until just a few days ago. She notes she checks her blood sugars very infrequently mostly when she feels shaky or dizzy advised her blood sugars roughly 1:30 to 140 but she's always eat and at that time. She is trying to manage a low-carb diet. No fevers, chills, GI complaints at today's visit.  Past Medical History  Diagnosis Date  . Diabetes mellitus     type 2  . Hyperlipidemia   . Hypertension   . Asthma   . NECK AND BACK PAIN 11/26/2010  . HYPERTENSION 11/09/2007  . HYPERLIPIDEMIA 11/09/2007  . HIP PAIN, RIGHT 11/26/2010  . HEMATURIA UNSPECIFIED 11/26/2010  . Vaginitis 01/01/2011  . ASTHMA 11/09/2007  . ALLERGIC RHINITIS CAUSE UNSPECIFIED 11/26/2010  . DIABETES MELLITUS, TYPE II 11/09/2007    Past Surgical History  Procedure Date  . Abdominal hysterectomy   . Cardiac catheterization     X 2, last one in 1998    Family History  Problem Relation Age of Onset  . Heart disease Mother   . Osteoarthritis Mother   . Sudden death Father   . Single kidney Father   . Other Father     h/o severe MVA injuries  . Hyperlipidemia Sister   . Other Daughter     Myalgias  . Fibromyalgia Daughter   .  Allergies Daughter   . Heart disease Maternal Grandfather   . Sudden death Paternal Grandmother   . Diabetes Paternal Grandfather   . Heart disease Daughter   . Other Daughter     palpitations    History   Social History  . Marital Status: Widowed    Spouse Name: N/A    Number of Children: N/A  . Years of Education: N/A   Occupational History  . Not on file.   Social History Main Topics  . Smoking status: Never Smoker   . Smokeless tobacco: Never Used  . Alcohol Use: No  . Drug Use: No  . Sexually Active: No   Other Topics Concern  . Not on file   Social History Narrative  . No narrative on file    Current Outpatient Prescriptions on File Prior to Visit  Medication Sig Dispense Refill  . glucose blood test strip 1 each by Other route as needed. Accu-Check Aviva Strips- check blood sugars bid and prn       . NON FORMULARY Accu-Chek Lancets misc- Check blood sugars bid and prn       . NON FORMULARY Accu-Check Aviva Kit       . NON FORMULARY BD Disp Needles 30G X 1/2" misc       . simvastatin (ZOCOR) 40 MG tablet Take 40 mg by mouth daily.  Allergies  Allergen Reactions  . Codeine Phosphate     Review of Systems  Review of Systems  Constitutional: Negative for fever and malaise/fatigue.  HENT: Negative for congestion.   Eyes: Negative for discharge.  Respiratory: Negative for shortness of breath.   Cardiovascular: Negative for chest pain, palpitations and leg swelling.  Gastrointestinal: Negative for nausea, abdominal pain and diarrhea.  Genitourinary: Negative for dysuria.  Musculoskeletal: Negative for falls.  Skin: Negative for rash.  Neurological: Negative for loss of consciousness and headaches.  Endo/Heme/Allergies: Negative for polydipsia.  Psychiatric/Behavioral: Negative for depression and suicidal ideas. The patient is not nervous/anxious and does not have insomnia.     Objective  BP 152/86  Pulse 85  Temp(Src) 98 F (36.7 C) (Oral)   Ht 5\' 2"  (1.575 m)  Wt 144 lb 6.4 oz (65.499 kg)  BMI 26.41 kg/m2  SpO2 96%  Physical Exam  Physical Exam  Constitutional: She is oriented to person, place, and time and well-developed, well-nourished, and in no distress. No distress.  HENT:  Head: Normocephalic and atraumatic.       Nasal mucosa boggy and erythematous TMs dull and retracted b/l  Eyes: Conjunctivae are normal. Right eye exhibits no discharge. Left eye exhibits no discharge.  Neck: Neck supple. No thyromegaly present.  Cardiovascular: Normal rate, regular rhythm and normal heart sounds.   No murmur heard. Pulmonary/Chest: Effort normal and breath sounds normal. She has no wheezes. She exhibits no tenderness.  Abdominal: She exhibits no distension and no mass.  Musculoskeletal: She exhibits no edema.  Lymphadenopathy:    She has no cervical adenopathy.  Neurological: She is alert and oriented to person, place, and time.  Skin: Skin is warm and dry. No rash noted. She is not diaphoretic.  Psychiatric: Memory, affect and judgment normal.    Lab Results  Component Value Date   TSH 2.76 12/27/2010   Lab Results  Component Value Date   WBC 7.5 12/27/2010   HGB 14.1 12/27/2010   HCT 40.9 12/27/2010   MCV 84.9 12/27/2010   PLT 258 12/27/2010   Lab Results  Component Value Date   CREATININE 1.2 12/27/2010   BUN 17 12/27/2010   NA 140 12/27/2010   K 3.8 12/27/2010   CL 101 12/27/2010   CO2 27 12/27/2010   Lab Results  Component Value Date   ALT 24 12/27/2010   AST 26 12/27/2010   ALKPHOS 51 12/27/2010   BILITOT 0.7 12/27/2010   Lab Results  Component Value Date   CHOL 198 12/27/2010   Lab Results  Component Value Date   HDL 57.00 12/27/2010   Lab Results  Component Value Date   LDLCALC 72 12/19/2009   Lab Results  Component Value Date   TRIG 257.0* 12/27/2010   Lab Results  Component Value Date   CHOLHDL 3 12/27/2010     Assessment & Plan  NECK AND BACK PAIN Did have some neck spasm in the left side  of her neck last week but this is improving, she is instructed on the use of moist heat and gentle stretching. Notify us if symptoms worsen  HYPERTENSION Patient has recently been taking Alka Seltzer Plus for a URI, she is encouraged not to take anything OTC unless it specifically says it is safe to take with HTN or if she runs it by use. Will continue to monitor  HYPERLIPIDEMIA Tolerating Simvastatin, continue same avoid trans fats and consider fish oil caps, continue to monitor  HIP PAIN, RIGHT Improved  somewhat at this visit. Continue to remain active and try moist heat and gentle stretching prn  HEMATURIA UNSPECIFIED At last visit patient symptomatic and treated with Cipro for 3 days, her symptoms improved until just the past couple of days when they began to recur with less intensity. Will recheck urine and treat with a longer course of ciprofloxacin  Vaginitis Is noting some increased vaginal itching lately. Will retreat with Abx for her URI and UTI symptoms and she may use Monostat while on this and then we will give a dose of Fluconazole to decrease the yeast over growth for after she has completed the Cipro  ASTHMA Patient denies any recent flares, no medications at this time.  ALLERGIC RHINITIS CAUSE UNSPECIFIED Try OTC Cetirizine or Loratadine and saline nasal spray notify us if symptoms worsen  DIABETES MELLITUS, TYPE II Patient with a greatly improved hgba1c of 6.1. She was concerned about some blood sugars around 130 to 140 but this is always after she has eaten. She is encouraged to avoid heavy carb loads and monitor sugars, if they run higher let us know.

## 2011-01-01 NOTE — Assessment & Plan Note (Signed)
Patient denies any recent flares, no medications at this time.

## 2011-01-01 NOTE — Assessment & Plan Note (Signed)
Is noting some increased vaginal itching lately. Will retreat with Abx for her URI and UTI symptoms and she may use Monostat while on this and then we will give a dose of Fluconazole to decrease the yeast over growth for after she has completed the Cipro

## 2011-01-01 NOTE — Assessment & Plan Note (Signed)
Patient with a greatly improved hgba1c of 6.1. She was concerned about some blood sugars around 130 to 140 but this is always after she has eaten. She is encouraged to avoid heavy carb loads and monitor sugars, if they run higher let us know.

## 2011-01-01 NOTE — Assessment & Plan Note (Signed)
Try OTC Cetirizine or Loratadine and saline nasal spray notify us if symptoms worsen

## 2011-01-01 NOTE — Assessment & Plan Note (Signed)
Did have some neck spasm in the left side of her neck last week but this is improving, she is instructed on the use of moist heat and gentle stretching. Notify us if symptoms worsen

## 2011-01-01 NOTE — Assessment & Plan Note (Signed)
Tolerating Simvastatin, continue same avoid trans fats and consider fish oil caps, continue to monitor

## 2011-01-01 NOTE — Assessment & Plan Note (Signed)
Patient has recently been taking Alka Seltzer Plus for a URI, she is encouraged not to take anything OTC unless it specifically says it is safe to take with HTN or if she runs it by use. Will continue to monitor

## 2011-01-14 HISTORY — PX: COLONOSCOPY: SHX174

## 2011-01-17 ENCOUNTER — Ambulatory Visit (AMBULATORY_SURGERY_CENTER): Payer: Medicare HMO | Admitting: *Deleted

## 2011-01-17 ENCOUNTER — Encounter: Payer: Self-pay | Admitting: Internal Medicine

## 2011-01-17 VITALS — Ht 63.0 in | Wt 145.0 lb

## 2011-01-17 DIAGNOSIS — Z1211 Encounter for screening for malignant neoplasm of colon: Secondary | ICD-10-CM

## 2011-01-17 MED ORDER — PEG-KCL-NACL-NASULF-NA ASC-C 100 G PO SOLR: ORAL | Status: DC

## 2011-01-28 ENCOUNTER — Other Ambulatory Visit: Payer: Self-pay | Admitting: Internal Medicine

## 2011-01-30 ENCOUNTER — Ambulatory Visit (AMBULATORY_SURGERY_CENTER): Payer: Medicare HMO | Admitting: Internal Medicine

## 2011-01-30 ENCOUNTER — Encounter: Payer: Self-pay | Admitting: Internal Medicine

## 2011-01-30 VITALS — BP 142/64 | HR 69 | Temp 98.6°F | Resp 15 | Ht 63.0 in | Wt 145.0 lb

## 2011-01-30 DIAGNOSIS — K5730 Diverticulosis of large intestine without perforation or abscess without bleeding: Secondary | ICD-10-CM

## 2011-01-30 DIAGNOSIS — Z1211 Encounter for screening for malignant neoplasm of colon: Secondary | ICD-10-CM

## 2011-01-30 LAB — GLUCOSE, CAPILLARY
Glucose-Capillary: 117 mg/dL — ABNORMAL HIGH (ref 70–99)
Glucose-Capillary: 112 mg/dL — ABNORMAL HIGH (ref 70–99)

## 2011-01-30 MED ORDER — SODIUM CHLORIDE 0.9 % IV SOLN: 500.0000 mL | INTRAVENOUS | Status: DC

## 2011-01-30 NOTE — Progress Notes (Signed)
Pt requested to go to the restroom.  I stayed by the pt's side, due to fact she was dizzy.  No complaints on discharge. MAW

## 2011-01-30 NOTE — Patient Instructions (Signed)
Follow discharge instructions.  Continue your medications.  High Fiber diet.  Next Colonoscopy 10 years.

## 2011-01-31 ENCOUNTER — Telehealth: Payer: Self-pay | Admitting: *Deleted

## 2011-01-31 NOTE — Assessment & Plan Note (Signed)
[facility] OFFICE NOTE   Elizabeth Mcintyre, Elizabeth Mcintyre                      MRN:          [id number]  DATE:12/10/2006                            DOB:          [birth year]    A 70 year old female seen today to establish with our practice.  She has  a long history of hypertension and diabetes, also apparently some  dyslipidemia and a remote history of asthma.  She was hospitalized for a  hysterectomy in the 1970s due to prolapse.  She has also had 2 heart  catheterizations, the last approximately 10 years ago that was negative.  She did have a colonoscopy in 1999.   SOCIAL HISTORY:  She is an Print production planner for [employer].  She is  widowed with 3 children and 4 grandchildren.   FAMILY HISTORY:  Father died at 56, sudden death.  Mother died at 25 and  had history of coronary artery disease.  One sister is well at 45.   PHYSICAL EXAMINATION:  A well-developed, healthy appearing, mildly  overweight female in no acute distress.  Blood pressure 160/90.  HEENT:  Fundi, ears, nose and throat clear.  NECK:  No bruits.  CHEST:  Clear.  CARDIOVASCULAR:  Normal heart sounds, no murmurs.  ABDOMEN:  Benign.  EXTREMITIES:  Faint peripheral pulses, no edema, intact to monofilament  testing.   IMPRESSION:  Hypertension, diabetes, history of dyslipidemia.   DISPOSITION:  Diovan will be switched to Diovan HCT 160/12.5.  Reassess  her bp in 6 weeks.  Laboratory screen reviewed.  In view of weakness,  achiness in the upper back shoulder area a sed rate will also be added.     Gordy Savers, MD  Electronically Signed    PFK/MedQ  DD: 12/10/2006  DT: 12/10/2006  Job #: [id number]

## 2011-01-31 NOTE — Telephone Encounter (Signed)
Called number in chart, no answer, no identification on voice mail, no message left

## 2011-02-26 ENCOUNTER — Ambulatory Visit (INDEPENDENT_AMBULATORY_CARE_PROVIDER_SITE_OTHER): Payer: Medicare HMO | Admitting: Family Medicine

## 2011-02-26 ENCOUNTER — Encounter: Payer: Self-pay | Admitting: Family Medicine

## 2011-02-26 ENCOUNTER — Other Ambulatory Visit (HOSPITAL_COMMUNITY)
Admission: RE | Admit: 2011-02-26 | Discharge: 2011-02-26 | Disposition: A | Discharge status: Home / Self Care | Payer: Medicare HMO | Source: Ambulatory Visit | Attending: Family Medicine | Admitting: Family Medicine

## 2011-02-26 DIAGNOSIS — E119 Type 2 diabetes mellitus without complications: Secondary | ICD-10-CM

## 2011-02-26 DIAGNOSIS — R208 Other disturbances of skin sensation: Secondary | ICD-10-CM

## 2011-02-26 DIAGNOSIS — R209 Unspecified disturbances of skin sensation: Secondary | ICD-10-CM

## 2011-02-26 DIAGNOSIS — N760 Acute vaginitis: Secondary | ICD-10-CM | POA: Insufficient documentation

## 2011-02-26 DIAGNOSIS — R300 Dysuria: Secondary | ICD-10-CM

## 2011-02-26 DIAGNOSIS — N952 Postmenopausal atrophic vaginitis: Secondary | ICD-10-CM

## 2011-02-26 DIAGNOSIS — R319 Hematuria, unspecified: Secondary | ICD-10-CM

## 2011-02-26 DIAGNOSIS — I10 Essential (primary) hypertension: Secondary | ICD-10-CM

## 2011-02-26 LAB — POCT URINALYSIS DIPSTICK
Glucose, UA: NEGATIVE
Ketones, UA: NEGATIVE
Protein, UA: NEGATIVE
Spec Grav, UA: 1.01
Urobilinogen, UA: 0.2

## 2011-02-26 MED ORDER — ESTROGENS, CONJUGATED 0.625 MG/GM VA CREA: TOPICAL_CREAM | Freq: Every day | VAGINAL | Status: DC

## 2011-02-26 MED ORDER — CLOBETASOL PROPIONATE 0.05 % EX CREA: TOPICAL_CREAM | Freq: Every evening | CUTANEOUS | Status: AC | PRN

## 2011-02-26 NOTE — Patient Instructions (Signed)
Atrophic Vaginitis Atrophic vaginitis is a problem of low levels of estrogen in women.   This problem can happen at any age. It is most common in women who have gone through menopause ("the change"). Menopause happens in women who:  Are ages 42-55.   Have had surgery to remove their ovaries.  It can also happen in women:  Who are breast-feeding.   Using medicines that lowers estrogen levels in the blood.   Who are having radiation or cancer treatments.  HOW WILL I KNOW IF I HAVE THIS PROBLEM? You may have:  Trouble with peeing, such as:  Going to the bathroom often.   A hard time holding the pee till you reach a bathroom.   Leaking pee.   Having pain when you pee.   Itching and/or a burning feeling.  Vaginal bleeding and spotting.   Pain during sex.   Dryness of the vagina.   A yellow, bad smelling discharge coming from the vagina.   HOW WILL MY DOCTOR CHECK FOR THIS PROBLEM?  During your exam, your doctor will likely find the problem.   If there is a vaginal discharge, it may be checked for infection.  HOW WILL THIS PROBLEM BE TREATED?  Keep the vulvar skin as clean as possible. Moisturizers and lubricants can help with some of the symptoms.   Estrogen replacement can help. There are 2 ways to take estrogen:   Systemic Estrogen gets estrogen to your whole body. It takes many weeks or months before the symptoms get better.  - You take an estrogen pill. - You use a skin Patch. This is a patch that you put on your skin.  - If you still have your uterus, your doctor may ask you to take a hormone. Talk to your doctor about the right medicine for you.  Estrogen Cream. This puts estrogen only at the part of your body where you apply it. The cream is put into the vagina or put on the vulvar skin. For some women, estrogen cream works faster than pills or the patch.  CAN ALL WOMEN WITH THIS PROBLEM USE ESTROGEN? No. Women with certain types of cancer, liver problems, or  problems with blood clots should not take estrogen. Your doctor can help you decide the best treatment for your symptoms. Easy-to-Read style based on content from [facility] [place], [place] Document Released: 02/18/2008 Document Re-Released: 07/04/2008 [facility] Patient Information 2011 [place], [place].  Start a probiotic such as Glass blower/designer daily and a fiber supplement daily, 64oz of clear fluids, wear cotton undies and avoid harsh soaps, consider Witch Hazel Astringent for itching when the skin is intact

## 2011-02-28 LAB — URINE CULTURE

## 2011-03-01 ENCOUNTER — Other Ambulatory Visit: Payer: Self-pay | Admitting: Internal Medicine

## 2011-03-01 NOTE — Assessment & Plan Note (Signed)
No recent spike in sugars, tolerating meds

## 2011-03-01 NOTE — Assessment & Plan Note (Signed)
Elevated today but patient very uncomfortable, patient will return for recheck, call if any concerning symptoms develop and continue Hyzaar for now. Patient unsure if she has taken it today

## 2011-03-01 NOTE — Assessment & Plan Note (Addendum)
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 Pap smear and wet prep were performed today await results. Is given a sample of Premarin cream to apply each bedtime on Sunday evenings to see if that is helpful and given some clobetasol to use a slight amount daily for the next 7 days to help heal the irritation and itchiness present right now reporting worsening of symptoms for further evaluation. Patient asked to take a probiotic daily

## 2011-03-01 NOTE — Progress Notes (Signed)
Elizabeth Mcintyre [id number] 22-Feb-1941 03/01/2011      Progress Note-Follow Up  Subjective  Chief Complaint  Chief Complaint  Patient presents with  . Vaginal Itching     X  on and off months    HPI  Patient is in today for evaluation of vaginal irritation. She describes no discharge but significant itching and irritation. No dysuria, fevers, chills, hematuria. Is sexually active with her husband only. No abdominal or back pain. No significant discharge is noted. OB/GYN history she is a G3 P3 status post previous spontaneous vaginal deliveries and a partial hysterectomy at age 70 for a prolapsed uterus. Menses began at age 70 or always painful and irregular. She denies a history of abnormal Paps does have a left-sided breast lumpectomy years ago which was benign and otherwise her mammograms have been normal. She is offering no breast complaints today and has not had a Pap in a number of years. Otherwise physically she feels well although she does that she's been under a great deal of stress recently. No chest pain, palpitations, headache, shortness of breath, GI complaints except for occasional loose stool  Past Medical History  Diagnosis Date  . Diabetes mellitus     type 2  . Hyperlipidemia   . Hypertension   . Asthma   . NECK AND BACK PAIN 11/26/2010  . HYPERTENSION 11/09/2007  . HYPERLIPIDEMIA 11/09/2007  . HIP PAIN, RIGHT 11/26/2010  . HEMATURIA UNSPECIFIED 11/26/2010  . Vaginitis 01/01/2011  . ASTHMA 11/09/2007  . ALLERGIC RHINITIS CAUSE UNSPECIFIED 11/26/2010  . DIABETES MELLITUS, TYPE II 11/09/2007    Past Surgical History  Procedure Date  . Abdominal hysterectomy   . Cardiac catheterization     X 2, last one in 1998  . Colonoscopy   . Upper gastrointestinal endoscopy     Family History  Problem Relation Age of Onset  . Heart disease Mother   . Osteoarthritis Mother   . Sudden death Father   . Single kidney Father   . Other Father     h/o severe MVA injuries  .  Hyperlipidemia Sister   . Other Daughter     Myalgias  . Fibromyalgia Daughter   . Allergies Daughter   . Heart disease Maternal Grandfather   . Sudden death Paternal Grandmother   . Diabetes Paternal Grandfather   . Heart disease Daughter   . Other Daughter     palpitations    History   Social History  . Marital Status: Widowed    Spouse Name: N/A    Number of Children: N/A  . Years of Education: N/A   Occupational History  . Not on file.   Social History Main Topics  . Smoking status: Never Smoker   . Smokeless tobacco: Never Used  . Alcohol Use: No  . Drug Use: No  . Sexually Active: No   Other Topics Concern  . Not on file   Social History Narrative  . No narrative on file    Current Outpatient Prescriptions on File Prior to Visit  Medication Sig Dispense Refill  . glucose blood test strip 1 each by Other route as needed. Accu-Check Aviva Strips- check blood sugars bid and prn       . LORazepam (ATIVAN) 0.5 MG tablet Take 1 tablet (0.5 mg total) by mouth 2 (two) times daily as needed. For stress  10 tablet  1  . losartan-hydrochlorothiazide (HYZAAR) 100-25 MG per tablet Take 1 tablet by mouth daily.  90 tablet  3  . metFORMIN (GLUCOPHAGE) 500 MG tablet Take 1 tablet (500 mg total) by mouth 2 (two) times daily with a meal.  180 tablet  3  . NON FORMULARY Accu-Chek Lancets misc- Check blood sugars bid and prn       . NON FORMULARY Accu-Check Aviva Kit       . NON FORMULARY BD Disp Needles 30G X 1/2" misc       . peg 3350 powder (MOVIPREP) 100 G SOLR MOVI PREP take as directed  1 kit  0  . simvastatin (ZOCOR) 40 MG tablet TAKE 1 TABLET DAILY  90 tablet  3   Current Facility-Administered Medications on File Prior to Visit  Medication Dose Route Frequency Provider Last Rate Last Dose  . 0.9 %  sodium chloride infusion  500 mL Intravenous Continuous Hart Carwin, MD        Allergies  Allergen Reactions  . Codeine Phosphate     Review of Systems  Review of  Systems  Constitutional: Negative for fever and malaise/fatigue.  HENT: Negative for congestion.   Eyes: Negative for discharge.  Respiratory: Negative for shortness of breath.   Cardiovascular: Negative for chest pain, palpitations and leg swelling.  Gastrointestinal: Negative for nausea, abdominal pain and diarrhea.  Genitourinary: Negative for dysuria, urgency, frequency, hematuria and flank pain.       Vaginal itching and irritation noting  Musculoskeletal: Negative for falls.  Skin: Negative for rash.  Neurological: Negative for loss of consciousness and headaches.  Endo/Heme/Allergies: Negative for polydipsia.  Psychiatric/Behavioral: Negative for depression and suicidal ideas. The patient is not nervous/anxious and does not have insomnia.     Objective  BP 154/94  Pulse 84  Temp(Src) 97.7 F (36.5 C) (Oral)  Ht 5\' 3"  (1.6 m)  Wt 145 lb 12.8 oz (66.134 kg)  BMI 25.83 kg/m2  SpO2 99%  Physical Exam  Physical Exam  Constitutional: She is oriented to person, place, and time and well-developed, well-nourished, and in no distress. No distress.  HENT:  Head: Normocephalic and atraumatic.  Eyes: Conjunctivae are normal.  Neck: Neck supple. No thyromegaly present.  Cardiovascular: Normal rate, regular rhythm and normal heart sounds.   No murmur heard. Pulmonary/Chest: Effort normal and breath sounds normal. She has no wheezes.  Abdominal: She exhibits no distension and no mass.  Genitourinary:       Cant vaginal discharge, clear. Mucosa is thin without lesions. Cervix is surgically absent. No adnexal masses  Musculoskeletal: She exhibits no edema.  Lymphadenopathy:    She has no cervical adenopathy.  Neurological: She is alert and oriented to person, place, and time.  Skin: Skin is warm and dry. No rash noted. She is not diaphoretic.  Psychiatric: Memory, affect and judgment normal.    Lab Results  Component Value Date   TSH 2.76 12/27/2010   Lab Results  Component  Value Date   WBC 7.5 12/27/2010   HGB 14.1 12/27/2010   HCT 40.9 12/27/2010   MCV 84.9 12/27/2010   PLT 258 12/27/2010   Lab Results  Component Value Date   CREATININE 1.2 12/27/2010   BUN 17 12/27/2010   NA 140 12/27/2010   K 3.8 12/27/2010   CL 101 12/27/2010   CO2 27 12/27/2010   Lab Results  Component Value Date   ALT 24 12/27/2010   AST 26 12/27/2010   ALKPHOS 51 12/27/2010   BILITOT 0.7 12/27/2010   Lab Results  Component Value Date   CHOL 198 12/27/2010  Lab Results  Component Value Date   HDL 57.00 12/27/2010   Lab Results  Component Value Date   LDLCALC 72 12/19/2009   Lab Results  Component Value Date   TRIG 257.0* 12/27/2010   Lab Results  Component Value Date   CHOLHDL 3 12/27/2010     Assessment & Plan  Vaginitis Appears to be atrophic vaginitis. Pap smear and wet prep were performed today await results. Is given a sample of Premarin cream to apply each bedtime on Sunday evenings to see if that is helpful and given some clobetasol to use a slight amount daily for the next 7 days to help heal the irritation and itchiness present right now reporting worsening of symptoms for further evaluation. Patient asked to take a probiotic daily  HYPERTENSION Elevated today but patient very uncomfortable, patient will return for recheck, call if any concerning symptoms develop and continue Hyzaar for now. Patient unsure if she has taken it today  DIABETES MELLITUS, TYPE II No recent spike in sugars, tolerating meds

## 2011-03-03 NOTE — Telephone Encounter (Signed)
Dr blyth pt  

## 2011-03-16 DIAGNOSIS — K5790 Diverticulosis of intestine, part unspecified, without perforation or abscess without bleeding: Secondary | ICD-10-CM

## 2011-03-16 HISTORY — DX: Diverticulosis of intestine, part unspecified, without perforation or abscess without bleeding: K57.90

## 2011-03-27 ENCOUNTER — Other Ambulatory Visit: Payer: Medicare HMO

## 2011-03-28 ENCOUNTER — Other Ambulatory Visit (INDEPENDENT_AMBULATORY_CARE_PROVIDER_SITE_OTHER): Payer: Medicare HMO

## 2011-03-28 DIAGNOSIS — E119 Type 2 diabetes mellitus without complications: Secondary | ICD-10-CM

## 2011-03-28 DIAGNOSIS — E785 Hyperlipidemia, unspecified: Secondary | ICD-10-CM

## 2011-03-28 DIAGNOSIS — I10 Essential (primary) hypertension: Secondary | ICD-10-CM

## 2011-03-28 LAB — LIPID PANEL
Cholesterol: 215 mg/dL — ABNORMAL HIGH (ref 0–200)
Total CHOL/HDL Ratio: 4
Triglycerides: 341 mg/dL — ABNORMAL HIGH (ref 0.0–149.0)
VLDL: 68.2 mg/dL — ABNORMAL HIGH (ref 0.0–40.0)

## 2011-03-28 LAB — CBC WITH DIFFERENTIAL/PLATELET
Basophils Relative: 0.7 % (ref 0.0–3.0)
Eosinophils Absolute: 0.7 10*3/uL (ref 0.0–0.7)
Hemoglobin: 13.1 g/dL (ref 12.0–15.0)
Lymphocytes Relative: 33.9 % (ref 12.0–46.0)
Monocytes Relative: 8.1 % (ref 3.0–12.0)
Neutro Abs: 2.8 10*3/uL (ref 1.4–7.7)
Neutrophils Relative %: 45.8 % (ref 43.0–77.0)
RBC: 4.32 Mil/uL (ref 3.87–5.11)
WBC: 6 10*3/uL (ref 4.5–10.5)

## 2011-03-28 LAB — RENAL FUNCTION PANEL
Albumin: 4.6 g/dL (ref 3.5–5.2)
Chloride: 102 mEq/L (ref 96–112)
GFR: 60.54 mL/min (ref >60.00)
Glucose, Bld: 124 mg/dL — ABNORMAL HIGH (ref 70–99)
Phosphorus: 3.2 mg/dL (ref 2.3–4.6)
Potassium: 3.5 mEq/L (ref 3.5–5.1)
Sodium: 138 mEq/L (ref 135–145)

## 2011-03-28 LAB — HEPATIC FUNCTION PANEL
AST: 31 U/L (ref 0–37)
Albumin: 4.6 g/dL (ref 3.5–5.2)
Alkaline Phosphatase: 47 U/L (ref 39–117)
Bilirubin, Direct: 0 mg/dL (ref 0.0–0.3)
Total Protein: 7.2 g/dL (ref 6.0–8.3)

## 2011-03-28 LAB — LDL CHOLESTEROL, DIRECT: Direct LDL: 137.2 mg/dL

## 2011-04-01 ENCOUNTER — Ambulatory Visit (INDEPENDENT_AMBULATORY_CARE_PROVIDER_SITE_OTHER): Payer: Medicare HMO | Admitting: Family Medicine

## 2011-04-01 ENCOUNTER — Encounter: Payer: Self-pay | Admitting: Family Medicine

## 2011-04-01 ENCOUNTER — Other Ambulatory Visit: Payer: Self-pay | Admitting: Family Medicine

## 2011-04-01 DIAGNOSIS — E785 Hyperlipidemia, unspecified: Secondary | ICD-10-CM

## 2011-04-01 DIAGNOSIS — N952 Postmenopausal atrophic vaginitis: Secondary | ICD-10-CM

## 2011-04-01 DIAGNOSIS — E119 Type 2 diabetes mellitus without complications: Secondary | ICD-10-CM

## 2011-04-01 DIAGNOSIS — R109 Unspecified abdominal pain: Secondary | ICD-10-CM

## 2011-04-01 DIAGNOSIS — N760 Acute vaginitis: Secondary | ICD-10-CM

## 2011-04-01 DIAGNOSIS — R52 Pain, unspecified: Secondary | ICD-10-CM

## 2011-04-01 DIAGNOSIS — F419 Anxiety disorder, unspecified: Secondary | ICD-10-CM

## 2011-04-01 DIAGNOSIS — I10 Essential (primary) hypertension: Secondary | ICD-10-CM

## 2011-04-01 DIAGNOSIS — Z1231 Encounter for screening mammogram for malignant neoplasm of breast: Secondary | ICD-10-CM

## 2011-04-01 DIAGNOSIS — R599 Enlarged lymph nodes, unspecified: Secondary | ICD-10-CM

## 2011-04-01 DIAGNOSIS — R591 Generalized enlarged lymph nodes: Secondary | ICD-10-CM

## 2011-04-01 DIAGNOSIS — F341 Dysthymic disorder: Secondary | ICD-10-CM

## 2011-04-01 DIAGNOSIS — M542 Cervicalgia: Secondary | ICD-10-CM

## 2011-04-01 DIAGNOSIS — F411 Generalized anxiety disorder: Secondary | ICD-10-CM

## 2011-04-01 DIAGNOSIS — F418 Other specified anxiety disorders: Secondary | ICD-10-CM

## 2011-04-01 MED ORDER — LORAZEPAM 0.5 MG PO TABS: ORAL_TABLET | ORAL | Status: DC

## 2011-04-01 MED ORDER — FLUOXETINE HCL 10 MG PO CAPS: 10.0000 mg | ORAL_CAPSULE | Freq: Every day | ORAL | Status: DC

## 2011-04-01 MED ORDER — ROSUVASTATIN CALCIUM 40 MG PO TABS: 40.0000 mg | ORAL_TABLET | Freq: Every day | ORAL | Status: DC

## 2011-04-01 MED ORDER — ESTROGENS, CONJUGATED 0.625 MG/GM VA CREA: TOPICAL_CREAM | Freq: Every day | VAGINAL | Status: DC

## 2011-04-01 NOTE — Patient Instructions (Signed)

## 2011-04-03 ENCOUNTER — Encounter: Payer: Self-pay | Admitting: Family Medicine

## 2011-04-03 DIAGNOSIS — F418 Other specified anxiety disorders: Secondary | ICD-10-CM

## 2011-04-03 DIAGNOSIS — F329 Major depressive disorder, single episode, unspecified: Secondary | ICD-10-CM

## 2011-04-03 DIAGNOSIS — F339 Major depressive disorder, recurrent, unspecified: Secondary | ICD-10-CM | POA: Insufficient documentation

## 2011-04-03 HISTORY — DX: Major depressive disorder, single episode, unspecified: F32.9

## 2011-04-03 HISTORY — DX: Other specified anxiety disorders: F41.8

## 2011-04-03 NOTE — Assessment & Plan Note (Signed)
Patient experiencing some vague abdominal and back pain and review of her chart does show a CT from 2011 which did have some notable lymphadenopathy, will repeat CT to rule out any concerning pathology before  Proceeding with further treatments. Pain is mild and tolerable at this time

## 2011-04-03 NOTE — Assessment & Plan Note (Signed)
Pain varies but persists. Has pain throughout back at times, low back and hips bothering her more at present. Some right lower extremity radiculopathy noted. Patient manages pain well, encouraged Tylenol prn and we will monitor

## 2011-04-03 NOTE — Progress Notes (Signed)
Elizabeth Mcintyre [id number] Feb 01, 1941 04/03/2011      Progress Note-Follow Up  Subjective  Chief Complaint  Chief Complaint  Patient presents with  . Follow-up    3 month follow up    HPI  Patient is a 70 year old Caucasian female in for followup on multiple problems. At her last visit she was given with vaginitis and was placed on Premarin cream just once a day and clobetasol infrequently and is doing much better. She reports this minimal treatment has controlled her symptoms and she's not having any itching, dysuria rash or irritation. No fevers, chills. She does continue to complain of some abdominal pain and some low back pain which has been present for quite some time reviewing her chart shows a CAT scan back in 2011 which did have some lymphadenopathy in the neck she's had no recent change in bowel or bladder habits and her pain is mild and making. No chest pain, palpitations, shortness of breath. She is struggling with increasing anxiety and depression. Has trouble sleeping at times. Has previously taken Ambien but it made her too sleepy. She reports taking Prozac in the past and getting a good response of her low mood. Denies any suicidal ideation. Does struggle with anhedonia and difficulty concentrating however  Past Medical History  Diagnosis Date  . Diabetes mellitus     type 2  . Hyperlipidemia   . Hypertension   . Asthma   . NECK AND BACK PAIN 11/26/2010  . HYPERTENSION 11/09/2007  . HYPERLIPIDEMIA 11/09/2007  . HIP PAIN, RIGHT 11/26/2010  . HEMATURIA UNSPECIFIED 11/26/2010  . Vaginitis 01/01/2011  . ASTHMA 11/09/2007  . ALLERGIC RHINITIS CAUSE UNSPECIFIED 11/26/2010  . DIABETES MELLITUS, TYPE II 11/09/2007    Past Surgical History  Procedure Date  . Abdominal hysterectomy   . Cardiac catheterization     X 2, last one in 1998  . Colonoscopy   . Upper gastrointestinal endoscopy     Family History  Problem Relation Age of Onset  . Heart disease Mother   .  Osteoarthritis Mother   . Sudden death Father   . Single kidney Father   . Other Father     h/o severe MVA injuries  . Hyperlipidemia Sister   . Other Daughter     Myalgias  . Fibromyalgia Daughter   . Allergies Daughter   . Heart disease Maternal Grandfather   . Sudden death Paternal Grandmother   . Diabetes Paternal Grandfather   . Heart disease Daughter   . Other Daughter     palpitations    History   Social History  . Marital Status: Widowed    Spouse Name: N/A    Number of Children: N/A  . Years of Education: N/A   Occupational History  . Not on file.   Social History Main Topics  . Smoking status: Never Smoker   . Smokeless tobacco: Never Used  . Alcohol Use: No  . Drug Use: No  . Sexually Active: No   Other Topics Concern  . Not on file   Social History Narrative  . No narrative on file    Current Outpatient Prescriptions on File Prior to Visit  Medication Sig Dispense Refill  . clobetasol (TEMOVATE) 0.05 % cream Apply topically at bedtime as needed. Apply a small amount vaginally at bedtime for next week and then use sparingly qhs for any itching  30 g  1  . glucose blood test strip 1 each by Other route as needed. Accu-Check  Aviva Strips- check blood sugars bid and prn       . losartan-hydrochlorothiazide (HYZAAR) 100-25 MG per tablet TAKE 1 TABLET BY MOUTH EVERY DAY  90 tablet  0  . metFORMIN (GLUCOPHAGE) 500 MG tablet Take 1 tablet (500 mg total) by mouth 2 (two) times daily with a meal.  180 tablet  3  . NON FORMULARY Accu-Chek Lancets misc- Check blood sugars bid and prn       . NON FORMULARY Accu-Check Aviva Kit       . NON FORMULARY BD Disp Needles 30G X 1/2" misc       . peg 3350 powder (MOVIPREP) 100 G SOLR MOVI PREP take as directed  1 kit  0   Current Facility-Administered Medications on File Prior to Visit  Medication Dose Route Frequency Provider Last Rate Last Dose  . 0.9 %  sodium chloride infusion  500 mL Intravenous Continuous Hart Carwin, MD        Allergies  Allergen Reactions  . Codeine Phosphate     Review of Systems  Review of Systems  Constitutional: Negative for fever and malaise/fatigue.  HENT: Negative for congestion.   Eyes: Negative for discharge.  Respiratory: Negative for shortness of breath.   Cardiovascular: Negative for chest pain, palpitations and leg swelling.  Gastrointestinal: Negative for nausea, abdominal pain and diarrhea.  Genitourinary: Negative for dysuria.  Musculoskeletal: Negative for falls.  Skin: Negative for rash.  Neurological: Negative for loss of consciousness and headaches.  Endo/Heme/Allergies: Negative for polydipsia.  Psychiatric/Behavioral: Negative for depression and suicidal ideas. The patient is nervous/anxious and has insomnia.     Objective  BP 142/80  Pulse 68  Temp(Src) 98 F (36.7 C) (Oral)  Ht 5\' 3"  (1.6 m)  Wt 144 lb 12.8 oz (65.681 kg)  BMI 25.65 kg/m2  SpO2 98%  Physical Exam  Physical Exam  Constitutional: She is oriented to person, place, and time and well-developed, well-nourished, and in no distress. No distress.  HENT:  Head: Normocephalic and atraumatic.  Eyes: Conjunctivae are normal.  Neck: Neck supple. No thyromegaly present.  Cardiovascular: Normal rate, regular rhythm and normal heart sounds.   No murmur heard. Pulmonary/Chest: Effort normal and breath sounds normal. She has no wheezes.  Abdominal: She exhibits no distension and no mass.  Musculoskeletal: She exhibits no edema.  Lymphadenopathy:    She has no cervical adenopathy.  Neurological: She is alert and oriented to person, place, and time.  Skin: Skin is warm and dry. No rash noted. She is not diaphoretic.  Psychiatric: Memory, affect and judgment normal.    Lab Results  Component Value Date   TSH 0.96 03/28/2011   Lab Results  Component Value Date   WBC 6.0 03/28/2011   HGB 13.1 03/28/2011   HCT 38.2 03/28/2011   MCV 88.3 03/28/2011   PLT 199.0 03/28/2011   Lab  Results  Component Value Date   CREATININE 1.0 03/28/2011   BUN 17 03/28/2011   NA 138 03/28/2011   K 3.5 03/28/2011   CL 102 03/28/2011   CO2 28 03/28/2011   Lab Results  Component Value Date   ALT 27 03/28/2011   AST 31 03/28/2011   ALKPHOS 47 03/28/2011   BILITOT 0.8 03/28/2011   Lab Results  Component Value Date   CHOL 215* 03/28/2011   Lab Results  Component Value Date   HDL 53.10 03/28/2011   Lab Results  Component Value Date   LDLCALC 72 12/19/2009  Lab Results  Component Value Date   TRIG 341.0* 03/28/2011   Lab Results  Component Value Date   CHOLHDL 4 03/28/2011     Assessment & Plan  Vaginitis Greatly improved, she is using the Premarin Cream just once a week and has had a good improvement in her symptoms will continue the same, rx provided  NECK AND BACK PAIN Pain varies but persists. Has pain throughout back at times, low back and hips bothering her more at present. Some right lower extremity radiculopathy noted. Patient manages pain well, encouraged Tylenol prn and we will monitor  HYPERTENSION Mild elevation today but did not take her Losartanhct before coming out, will not change dosing today  DIABETES MELLITUS, TYPE II Well controlled per patient, hgba1c of 6.3, minimize simple carbs and maintain current med dosing  HYPERLIPIDEMIA Tolerating Simvastatin but not getting a good enough response, has taken Crestor in past and done well with it will switch todayencouraged to avoid trans fats and consider fish oil supplements  Depression with anxiety Patient notes increased difficulty with depression anxiety and insomnia recently. She struggled off and on over time and had a hard time her husband was a notes things are worsening again for various reasons. He is agreeable to restart Prozac which she's taken in the past we will start her on just 10 mg and she will notify us if she has any untoward consequences. We will reassess at next visit were as  needed.  Lymphadenopathy Patient experiencing some vague abdominal and back pain and review of her chart does show a CT from 2011 which did have some notable lymphadenopathy, will repeat CT to rule out any concerning pathology before  Proceeding with further treatments. Pain is mild and tolerable at this time

## 2011-04-03 NOTE — Assessment & Plan Note (Signed)
Greatly improved, she is using the Premarin Cream just once a week and has had a good improvement in her symptoms will continue the same, rx provided

## 2011-04-03 NOTE — Assessment & Plan Note (Addendum)
Tolerating Simvastatin but not getting a good enough response, has taken Crestor in past and done well with it will switch todayencouraged to avoid trans fats and consider fish oil supplements

## 2011-04-03 NOTE — Assessment & Plan Note (Signed)
Mild elevation today but did not take her Losartanhct before coming out, will not change dosing today

## 2011-04-03 NOTE — Assessment & Plan Note (Signed)
Patient notes increased difficulty with depression anxiety and insomnia recently. She struggled off and on over time and had a hard time her husband was a notes things are worsening again for various reasons. He is agreeable to restart Prozac which she's taken in the past we will start her on just 10 mg and she will notify us if she has any untoward consequences. We will reassess at next visit were as needed.

## 2011-04-03 NOTE — Assessment & Plan Note (Signed)
Well controlled per patient, hgba1c of 6.3, minimize simple carbs and maintain current med dosing

## 2011-04-08 ENCOUNTER — Other Ambulatory Visit: Payer: Self-pay

## 2011-04-08 DIAGNOSIS — F419 Anxiety disorder, unspecified: Secondary | ICD-10-CM

## 2011-04-08 MED ORDER — LORAZEPAM 0.5 MG PO TABS: ORAL_TABLET | ORAL | Status: DC

## 2011-04-09 ENCOUNTER — Telehealth: Payer: Self-pay

## 2011-04-09 DIAGNOSIS — R52 Pain, unspecified: Secondary | ICD-10-CM

## 2011-04-09 DIAGNOSIS — R591 Generalized enlarged lymph nodes: Secondary | ICD-10-CM

## 2011-04-09 MED ORDER — LORAZEPAM 0.5 MG PO TABS: ORAL_TABLET | ORAL | Status: DC

## 2011-04-09 NOTE — Telephone Encounter (Signed)
OK to refill with with same sig, # 30 1 rf

## 2011-04-09 NOTE — Telephone Encounter (Signed)
[facility] called asking if it was okay to do CT w/ just contrast. MD stated that was okay and they stated they need a new order sent to them.

## 2011-04-09 NOTE — Telephone Encounter (Signed)
Please place order.

## 2011-04-09 NOTE — Telephone Encounter (Signed)
Will change order for CT abd/pelvis to just with contrast per radiology recommendation

## 2011-04-10 ENCOUNTER — Ambulatory Visit
Admission: RE | Admit: 2011-04-10 | Discharge: 2011-04-10 | Disposition: A | Discharge status: Home / Self Care | Payer: Medicare HMO | Source: Ambulatory Visit | Attending: Family Medicine | Admitting: Family Medicine

## 2011-04-10 DIAGNOSIS — R52 Pain, unspecified: Secondary | ICD-10-CM

## 2011-04-10 DIAGNOSIS — Z1231 Encounter for screening mammogram for malignant neoplasm of breast: Secondary | ICD-10-CM

## 2011-04-10 IMAGING — CT CT ABD-PELV W/ CM
3 of 5 series · 12 of 36 positions shown, 19 images · IV contrast (READICAT/WATER & [ID] [id number])
Comparison: CT scan [DATE].

CLINICAL DATA: Lower abdominal pain and lymphadenopathy.

CT ABDOMEN AND PELVIS WITH CONTRAST
TECHNIQUE: Multidetector CT imaging of the abdomen and pelvis was
performed following the standard protocol during bolus
administration of intravenous contrast.
Contrast: 100 ml [48].

[Series 3: routine abdomen · axial · 0.70mm/px · z∈[-367,+8]mm · 9 of 95 slices shown, 15 images]
[im 10/95  soft-tissue]
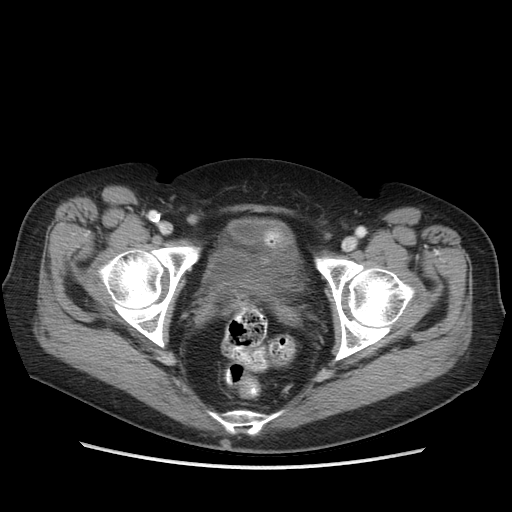
[im 10/95  bone]
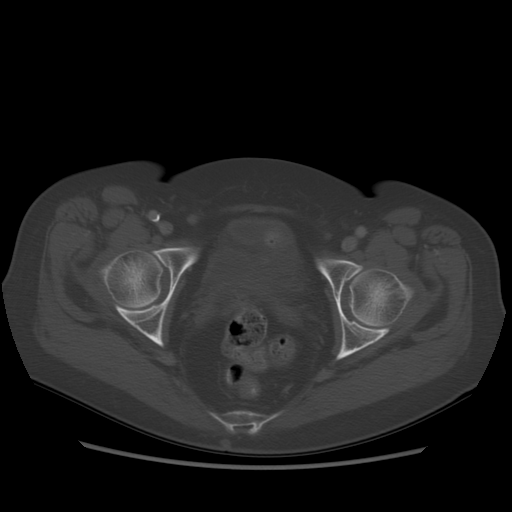
[im 19/95  soft-tissue]
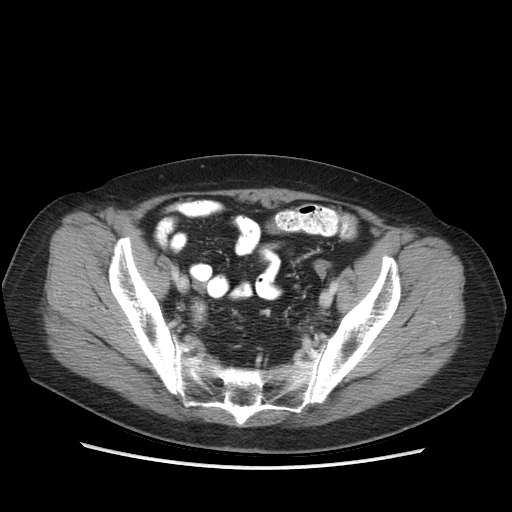
[im 29/95  soft-tissue]
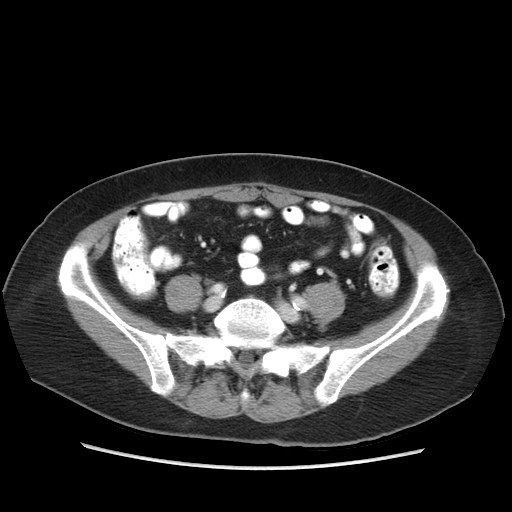
[im 38/95  soft-tissue]
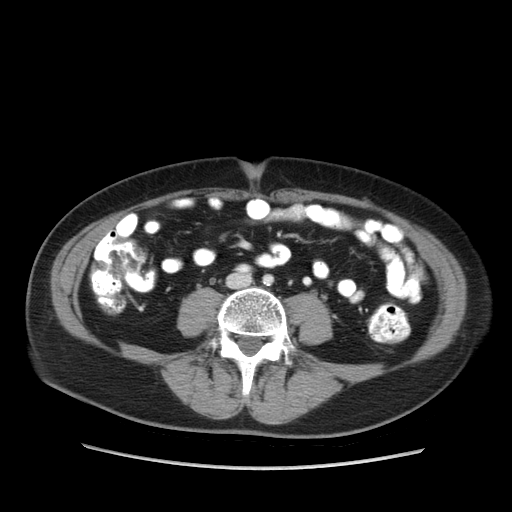
[im 48/95  soft-tissue]
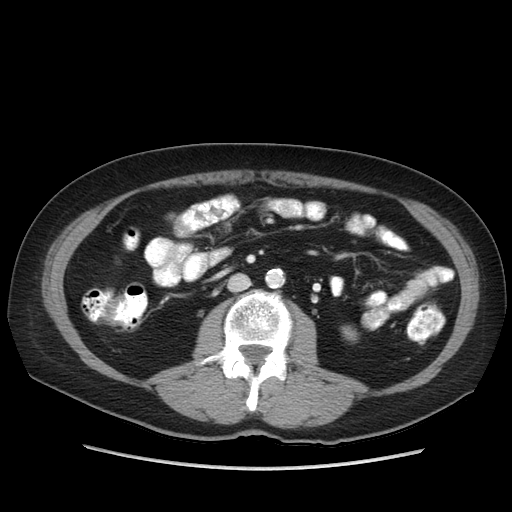
[im 57/95  soft-tissue]
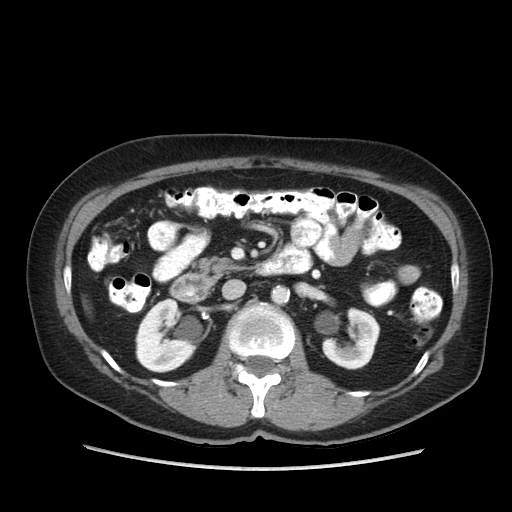
[im 57/95  lung]
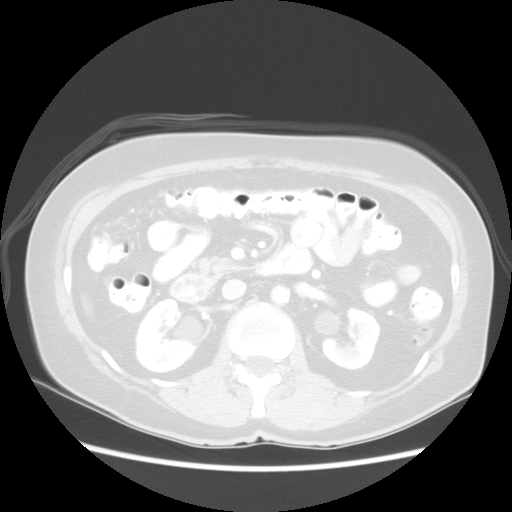
[im 66/95  soft-tissue]
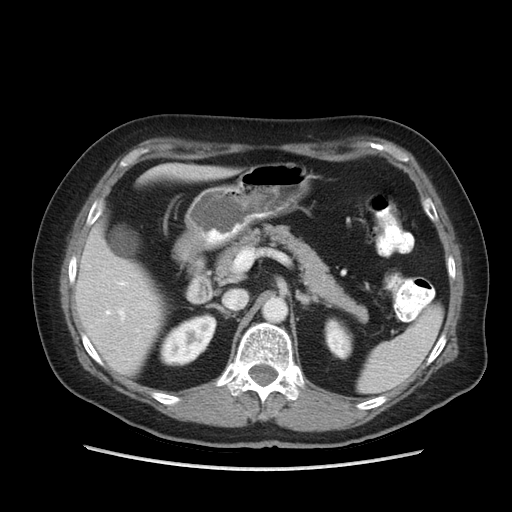
[im 66/95  lung]
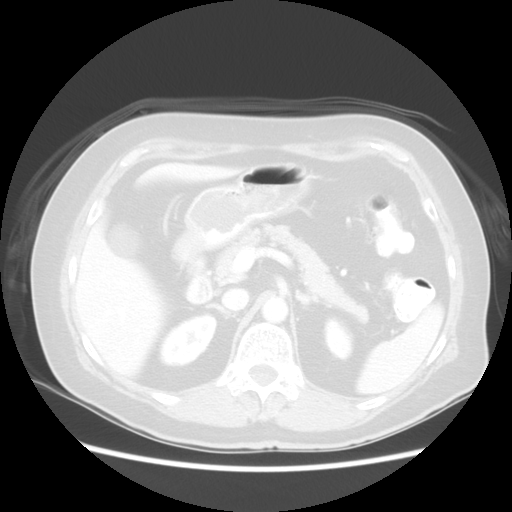
[im 76/95  soft-tissue]
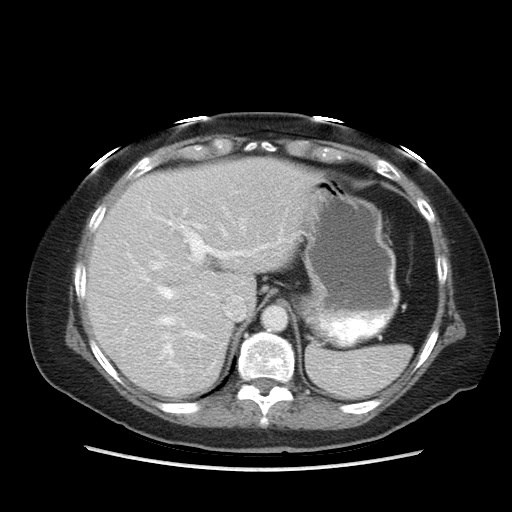
[im 76/95  lung]
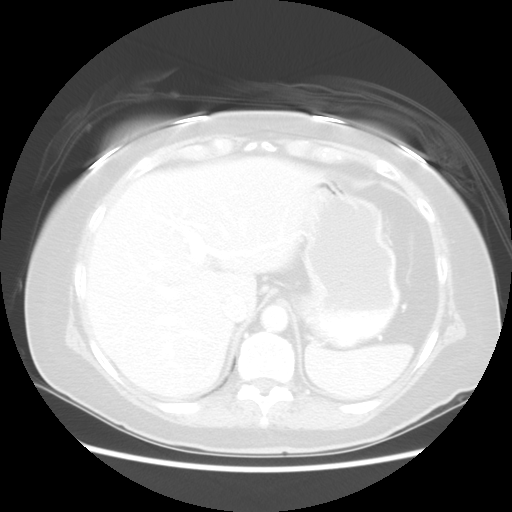
[im 85/95  soft-tissue]
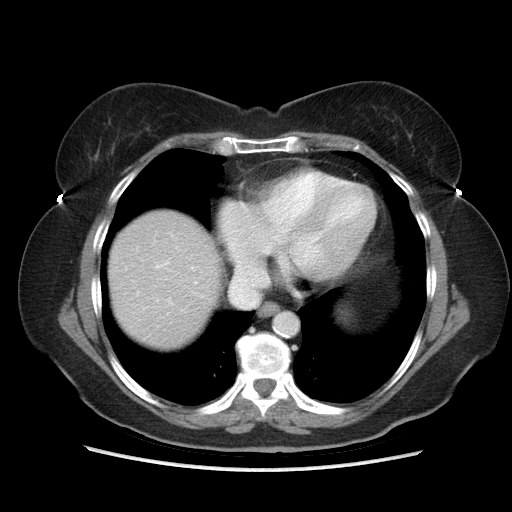
[im 85/95  lung]
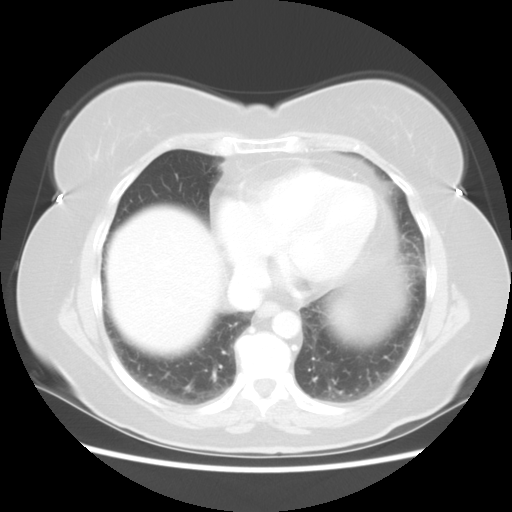
[im 85/95  bone]
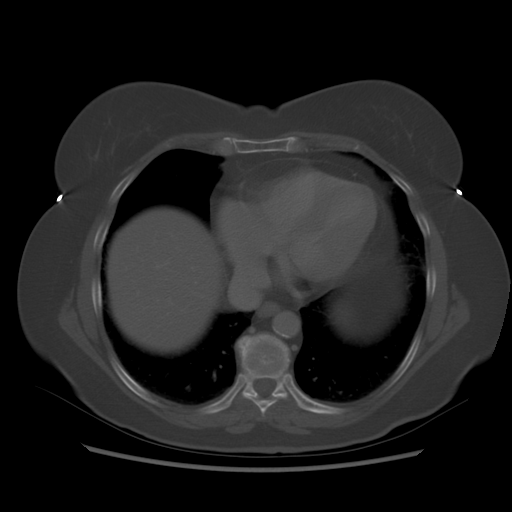

[Series 601: coronal body · coronal · 0.92mm/px · 1 of 109 slices shown, 2 images]
[im 37/109  soft-tissue]
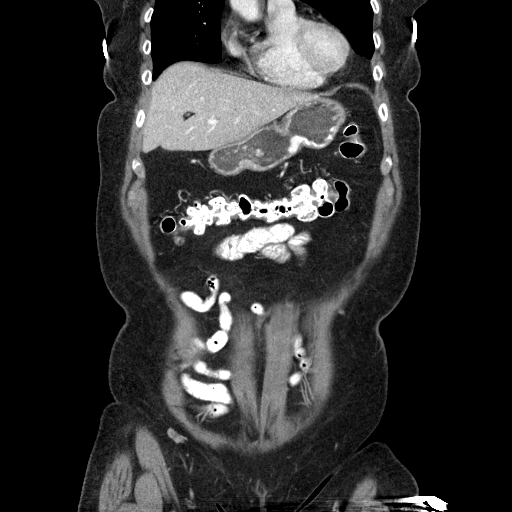
[im 37/109  bone]
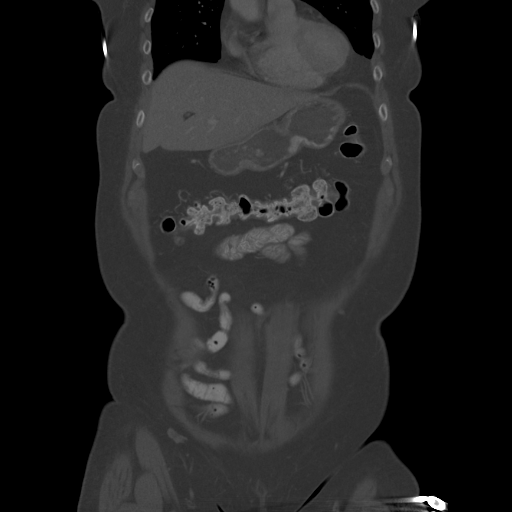

[Series 602: sagittal body · sagittal · 0.92mm/px · 2 of 145 slices shown]
[im 10/145  soft-tissue]
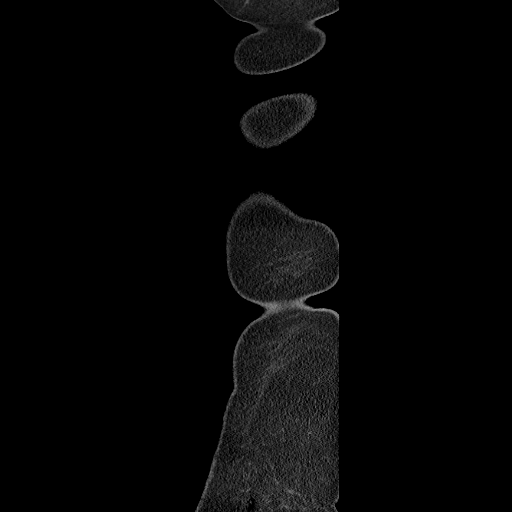
[im 29/145  soft-tissue]
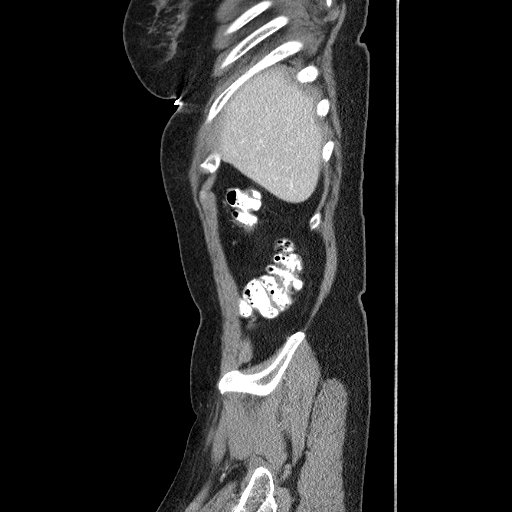

[12 of 36 positions shown; findings below may reference images not displayed]

FINDINGS: The lung bases are clear.

The liver is unremarkable.  No focal lesions or biliary dilatation.
The gallbladder is normal.  No common bile duct dilatation.  The
pancreas is normal and stable.  The spleen is normal in size.  No
focal lesions.  The adrenal glands and kidneys are unremarkable.
Slightly prominent extrarenal pelves are noted.

The stomach, duodenum, small bowel and colon are unremarkable.  No
acute inflammatory changes or mass lesions.  No mesenteric or
retroperitoneal masses or adenopathy.

Moderate to advanced atherosclerotic changes involving the aorta
and major branch vessel ostia.  A small calcified right renal
artery aneurysm is again noted.

The uterus is surgically absent.  Both ovaries appear normal.  No
pelvic mass, adenopathy or free pelvic fluid collections.  The
bladder appears normal.  No inguinal mass or hernia.  The bony
pelvis is intact.
IMPRESSION: 1.  No acute abdominal/pelvic findings, mass lesions or adenopathy.
2.  Stable advanced atherosclerotic changes involving the aorta and
branch vessels.
3.  Status post hysterectomy.

## 2011-04-10 MED ORDER — IOHEXOL 300 MG/ML  SOLN
100.0000 mL | Freq: Once | INTRAMUSCULAR | Status: AC | PRN
  Administered 2011-04-10: 100 mL via INTRAVENOUS

## 2011-05-09 ENCOUNTER — Other Ambulatory Visit (INDEPENDENT_AMBULATORY_CARE_PROVIDER_SITE_OTHER): Payer: Medicare HMO

## 2011-05-09 DIAGNOSIS — R52 Pain, unspecified: Secondary | ICD-10-CM

## 2011-05-09 LAB — SEDIMENTATION RATE: Sed Rate: 9 mm/hr (ref 0–22)

## 2011-05-12 LAB — ANA: Anti Nuclear Antibody(ANA): NEGATIVE

## 2011-05-12 NOTE — Progress Notes (Signed)
Pt informed

## 2011-05-13 ENCOUNTER — Ambulatory Visit (INDEPENDENT_AMBULATORY_CARE_PROVIDER_SITE_OTHER): Payer: Medicare HMO | Admitting: Family Medicine

## 2011-05-13 ENCOUNTER — Encounter: Payer: Self-pay | Admitting: Family Medicine

## 2011-05-13 DIAGNOSIS — F329 Major depressive disorder, single episode, unspecified: Secondary | ICD-10-CM

## 2011-05-13 DIAGNOSIS — M549 Dorsalgia, unspecified: Secondary | ICD-10-CM

## 2011-05-13 DIAGNOSIS — I10 Essential (primary) hypertension: Secondary | ICD-10-CM

## 2011-05-13 DIAGNOSIS — N760 Acute vaginitis: Secondary | ICD-10-CM

## 2011-05-13 DIAGNOSIS — F341 Dysthymic disorder: Secondary | ICD-10-CM

## 2011-05-13 DIAGNOSIS — F418 Other specified anxiety disorders: Secondary | ICD-10-CM

## 2011-05-13 DIAGNOSIS — E119 Type 2 diabetes mellitus without complications: Secondary | ICD-10-CM

## 2011-05-13 DIAGNOSIS — F32A Depression, unspecified: Secondary | ICD-10-CM

## 2011-05-13 DIAGNOSIS — F3289 Other specified depressive episodes: Secondary | ICD-10-CM

## 2011-05-13 DIAGNOSIS — E785 Hyperlipidemia, unspecified: Secondary | ICD-10-CM

## 2011-05-13 DIAGNOSIS — M25559 Pain in unspecified hip: Secondary | ICD-10-CM

## 2011-05-13 MED ORDER — CYCLOBENZAPRINE HCL 5 MG PO TABS: ORAL_TABLET | ORAL | Status: DC

## 2011-05-13 MED ORDER — FLUOXETINE HCL 20 MG PO CAPS: 20.0000 mg | ORAL_CAPSULE | Freq: Every day | ORAL | Status: DC

## 2011-05-13 NOTE — Assessment & Plan Note (Signed)
Tolerating Crestor will continue this dose for now and she is asked to avoid trans fats and start a fish oil supplement.

## 2011-05-13 NOTE — Assessment & Plan Note (Signed)
Well controlled at today's visit, no changes to therapy

## 2011-05-13 NOTE — Assessment & Plan Note (Signed)
She feels the Fluoxetine is helping to elevate her mood some. She agrees to increase to 20mg  daily and she is no longer using Ativan. She will keep that only for excessive anxiety.

## 2011-05-13 NOTE — Assessment & Plan Note (Signed)
C/o cramping and pain in right hip and leg. Worse with excessive walking but also keeping her up at night. We will try Cyclobenzaprine 5mg  tabs, 1-2 tabs po qhs to try. Moist heat and gentle stretching also encouraged

## 2011-05-13 NOTE — Patient Instructions (Signed)
Depression You have signs of depression. This is a common problem. It can occur at any age. It is often hard to recognize. People can suffer from depression and still have moments of enjoyment. Depression interferes with your basic ability to function in life. It upsets your relationships, sleep, eating, and work habits. CAUSES Depression is believed to be caused by an imbalance in brain chemicals. It may be triggered by an unpleasant event. Relationship crises, a death in the family, financial worries, retirement, or other stressors are normal causes of depression. Depression may also start for no known reason. Other factors that may play a part include medical illnesses, some medicines, genetics, and alcohol or drug abuse. SYMPTOMS  Feeling unhappy or worthless.   Long-lasting (chronic) tiredness or worn-out feeling.   Self-destructive thoughts and actions.   Not being able to sleep or sleeping too much.   Eating more than usual or not eating at all.   Headaches or feeling anxious.   Trouble concentrating or making decisions.   Unexplained physical problems and substance abuse.  TREATMENT Depression usually gets better with treatment. This can include:  Antidepressant medicines. It can take weeks before the proper dose is achieved and benefits are reached.   Talking with a therapist, clergyperson, counselor, or friend. These people can help you gain insight into your problem and regain control of your life.   Eating a good diet.   Getting regular physical exercise, such as walking for 30 minutes every day.   Not abusing alcohol or drugs.  Treating depression often takes 6 months or longer. This length of treatment is needed to keep symptoms from returning. Call your caregiver and arrange for follow-up care as suggested. SEEK IMMEDIATE MEDICAL CARE IF:  You start to have thoughts of hurting yourself or others.   Call your local emergency services (911 in [place]   Go to your  local medical emergency department.   Call the National Suicide Prevention Lifeline: 1-800-273-TALK (234)328-6811).  Document Released: 09/01/2005 Document Re-Released: 02/19/2010 [facility] Patient Information 2011 [place], [place].   Consider the Tdap shot to protect against pertussis and tetanus at or before next visit and consider the Zostavax to protect against shingles as well.

## 2011-05-13 NOTE — Progress Notes (Signed)
Elizabeth Mcintyre [id number] [birth year] 05/13/2011      Progress Note-Follow Up  Subjective  Chief Complaint  Chief Complaint  Patient presents with  . 6 week follow up    HPI  Patient is a 70 yo Caucasian female in today for follow up. Overall she feels better. The Premarin cream applied just once weekly has resolved vaginitis symptoms. She feels that walking is improving her mood. She's had no GI upset or concerns. She did have some episodes of feeling jittery and achy after the medication changes last visit and she attributed to her metformin at twice a day dosing so she dropped it to once a day dosing. On the once a day dosing she is noting sugars in the morning between 100 to 150. No polyuria no polydipsia no recent febrile no recent meds as pain, palpitations, shortness of breath, GI or GU complaints noted.. Still struggling with right hip pain and right lower extremity pain worse with excessive use but also keeps her up at night. She has stopped Ativan use as she felt it made her excessively sleepy and caused dry mouth.  Past Medical History  Diagnosis Date  . Diabetes mellitus     type 2  . Hyperlipidemia   . Hypertension   . Asthma   . NECK AND BACK PAIN 11/26/2010  . HYPERTENSION 11/09/2007  . HYPERLIPIDEMIA 11/09/2007  . HIP PAIN, RIGHT 11/26/2010  . HEMATURIA UNSPECIFIED 11/26/2010  . Vaginitis 01/01/2011  . ASTHMA 11/09/2007  . ALLERGIC RHINITIS CAUSE UNSPECIFIED 11/26/2010  . DIABETES MELLITUS, TYPE II 11/09/2007  . 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 Lymphadenopathy 04/03/2011  . Diverticulosis 03/2011    Past Surgical History  Procedure Date  . Abdominal hysterectomy   . Cardiac catheterization     X 2, last one in 1998  . Colonoscopy   . Upper gastrointestinal endoscopy     Family History  Problem Relation Age of Onset  . Heart disease Mother   . Osteoarthritis Mother   . Sudden death Father   . Single kidney Father   . Other Father     h/o severe MVA  injuries  . Hyperlipidemia Sister   . Other Daughter     Myalgias  . Fibromyalgia Daughter   . Allergies Daughter   . Heart disease Maternal Grandfather   . Sudden death Paternal Grandmother   . Diabetes Paternal Grandfather   . Heart disease Daughter   . Other Daughter     palpitations    History   Social History  . Marital Status: Widowed    Spouse Name: N/A    Number of Children: N/A  . Years of Education: N/A   Occupational History  . Not on file.   Social History Main Topics  . Smoking status: Never Smoker   . Smokeless tobacco: Never Used  . Alcohol Use: No  . Drug Use: No  . Sexually Active: No   Other Topics Concern  . Not on file   Social History Narrative  . No narrative on file    Current Outpatient Prescriptions on File Prior to Visit  Medication Sig Dispense Refill  . clobetasol (TEMOVATE) 0.05 % cream Apply topically at bedtime as needed. Apply a small amount vaginally at bedtime for next week and then use sparingly qhs for any itching  30 g  1  . glucose blood test strip 1 each by Other route as needed. Accu-Check Aviva Strips- check blood sugars bid and prn       .  LORazepam (ATIVAN) 0.5 MG tablet For stress and insomnia  10 tablet  1  . losartan-hydrochlorothiazide (HYZAAR) 100-25 MG per tablet TAKE 1 TABLET BY MOUTH EVERY DAY  90 tablet  0  . metFORMIN (GLUCOPHAGE) 500 MG tablet Take 1 tablet (500 mg total) by mouth 2 (two) times daily with a meal.  180 tablet  3  . NON FORMULARY Accu-Chek Lancets misc- Check blood sugars bid and prn       . NON FORMULARY Accu-Check Aviva Kit       . NON FORMULARY BD Disp Needles 30G X 1/2" misc       . rosuvastatin (CRESTOR) 40 MG tablet Take 1 tablet (40 mg total) by mouth daily.  30 tablet  3   Current Facility-Administered Medications on File Prior to Visit  Medication Dose Route Frequency Provider Last Rate Last Dose  . DISCONTD: 0.9 %  sodium chloride infusion  500 mL Intravenous Continuous Hart Carwin,  MD        Allergies  Allergen Reactions  . Codeine Phosphate     Review of Systems  Review of Systems  Constitutional: Negative for fever and malaise/fatigue.  HENT: Negative for congestion.   Eyes: Negative for discharge.  Respiratory: Negative for shortness of breath.   Cardiovascular: Negative for chest pain, palpitations, orthopnea and leg swelling.  Gastrointestinal: Negative for nausea, abdominal pain and diarrhea.  Genitourinary: Positive for hematuria. Negative for dysuria.  Musculoskeletal: Positive for joint pain. Negative for falls.       Right posterior hip pain with radicular symptoms down right leg.   Skin: Negative for rash.  Neurological: Negative for loss of consciousness and headaches.  Endo/Heme/Allergies: Negative for polydipsia.  Psychiatric/Behavioral: Negative for depression and suicidal ideas. The patient is not nervous/anxious and does not have insomnia.     Objective  BP 120/76  Pulse 74  Wt 142 lb (64.411 kg)  SpO2 93%  Physical Exam  Physical Exam  Constitutional: She is oriented to person, place, and time and well-developed, well-nourished, and in no distress. No distress.  HENT:  Head: Normocephalic and atraumatic.  Eyes: Conjunctivae are normal.  Neck: Neck supple. No thyromegaly present.  Cardiovascular: Normal rate, regular rhythm and normal heart sounds.   No murmur heard. Pulmonary/Chest: Effort normal and breath sounds normal. She has no wheezes.  Abdominal: She exhibits no distension and no mass.  Musculoskeletal: She exhibits no edema.  Lymphadenopathy:    She has no cervical adenopathy.  Neurological: She is alert and oriented to person, place, and time.  Skin: Skin is warm and dry. No rash noted. She is not diaphoretic.  Psychiatric: Memory, affect and judgment normal.    Lab Results  Component Value Date   TSH 0.96 03/28/2011   Lab Results  Component Value Date   WBC 6.0 03/28/2011   HGB 13.1 03/28/2011   HCT 38.2  03/28/2011   MCV 88.3 03/28/2011   PLT 199.0 03/28/2011   Lab Results  Component Value Date   CREATININE 1.0 03/28/2011   BUN 17 03/28/2011   NA 138 03/28/2011   K 3.5 03/28/2011   CL 102 03/28/2011   CO2 28 03/28/2011   Lab Results  Component Value Date   ALT 27 03/28/2011   AST 31 03/28/2011   ALKPHOS 47 03/28/2011   BILITOT 0.8 03/28/2011   Lab Results  Component Value Date   CHOL 215* 03/28/2011   Lab Results  Component Value Date   HDL 53.10 03/28/2011  Lab Results  Component Value Date   LDLCALC 72 12/19/2009   Lab Results  Component Value Date   TRIG 341.0* 03/28/2011   Lab Results  Component Value Date   CHOLHDL 4 03/28/2011     Assessment & Plan  Vaginitis Greatly improved with the addition of just one small dose of Premarin cram applied weekly. No c/o in this regard.  HYPERTENSION Well controlled at today's visit, no changes to therapy  HYPERLIPIDEMIA Tolerating Crestor will continue this dose for now and she is asked to avoid trans fats and start a fish oil supplement.  DIABETES MELLITUS, TYPE II Patient has generally only been taking 1 dose of  Metformin in the am each day. She reports typical morning sugars range from 100-150. She is encouraged to avoid simple carbs and monitor her sugars closely may increase Metformin back to bid if sugars worsen.  Depression with anxiety She feels the Fluoxetine is helping to elevate her mood some. She agrees to increase to 20mg  daily and she is no longer using Ativan. She will keep that only for excessive anxiety.  HIP PAIN, RIGHT C/o cramping and pain in right hip and leg. Worse with excessive walking but also keeping her up at night. We will try Cyclobenzaprine 5mg  tabs, 1-2 tabs po qhs to try. Moist heat and gentle stretching also encouraged

## 2011-05-13 NOTE — Assessment & Plan Note (Signed)
Patient has generally only been taking 1 dose of  Metformin in the am each day. She reports typical morning sugars range from 100-150. She is encouraged to avoid simple carbs and monitor her sugars closely may increase Metformin back to bid if sugars worsen.

## 2011-05-13 NOTE — Assessment & Plan Note (Signed)
Greatly improved with the addition of just one small dose of Premarin cram applied weekly. No c/o in this regard.

## 2011-07-11 ENCOUNTER — Other Ambulatory Visit (INDEPENDENT_AMBULATORY_CARE_PROVIDER_SITE_OTHER): Payer: Medicare HMO

## 2011-07-11 DIAGNOSIS — I10 Essential (primary) hypertension: Secondary | ICD-10-CM

## 2011-07-11 DIAGNOSIS — E119 Type 2 diabetes mellitus without complications: Secondary | ICD-10-CM

## 2011-07-11 DIAGNOSIS — E785 Hyperlipidemia, unspecified: Secondary | ICD-10-CM

## 2011-07-11 LAB — TSH: TSH: 3.04 u[IU]/mL (ref 0.35–5.50)

## 2011-07-11 LAB — LIPID PANEL
HDL: 56.2 mg/dL (ref >39.00)
LDL Cholesterol: 31 mg/dL (ref 0–99)
Total CHOL/HDL Ratio: 2
VLDL: 35.4 mg/dL (ref 0.0–40.0)

## 2011-07-11 LAB — CBC
HCT: 36.9 % (ref 36.0–46.0)
Hemoglobin: 12.9 g/dL (ref 12.0–15.0)
RBC: 4.43 MIL/uL (ref 3.87–5.11)
WBC: 6.6 10*3/uL (ref 4.0–10.5)

## 2011-07-11 LAB — RENAL FUNCTION PANEL
Creatinine, Ser: 1.2 mg/dL (ref 0.4–1.2)
GFR: 47.78 mL/min — ABNORMAL LOW (ref >60.00)
Glucose, Bld: 98 mg/dL (ref 70–99)
Sodium: 139 mEq/L (ref 135–145)

## 2011-07-11 LAB — HEPATIC FUNCTION PANEL
Alkaline Phosphatase: 45 U/L (ref 39–117)
Bilirubin, Direct: 0.1 mg/dL (ref 0.0–0.3)

## 2011-07-11 LAB — HEMOGLOBIN A1C: Hgb A1c MFr Bld: 6.1 % (ref 4.6–6.5)

## 2011-07-16 ENCOUNTER — Encounter: Payer: Self-pay | Admitting: Family Medicine

## 2011-07-16 ENCOUNTER — Ambulatory Visit (INDEPENDENT_AMBULATORY_CARE_PROVIDER_SITE_OTHER): Payer: Medicare HMO | Admitting: Family Medicine

## 2011-07-16 VITALS — BP 148/90 | HR 78 | Temp 98.1°F | Ht 63.0 in | Wt 137.8 lb

## 2011-07-16 DIAGNOSIS — R0683 Snoring: Secondary | ICD-10-CM

## 2011-07-16 DIAGNOSIS — R55 Syncope and collapse: Secondary | ICD-10-CM

## 2011-07-16 DIAGNOSIS — I10 Essential (primary) hypertension: Secondary | ICD-10-CM

## 2011-07-16 DIAGNOSIS — R0789 Other chest pain: Secondary | ICD-10-CM

## 2011-07-16 DIAGNOSIS — G4700 Insomnia, unspecified: Secondary | ICD-10-CM

## 2011-07-16 DIAGNOSIS — E876 Hypokalemia: Secondary | ICD-10-CM

## 2011-07-16 DIAGNOSIS — E119 Type 2 diabetes mellitus without complications: Secondary | ICD-10-CM

## 2011-07-16 DIAGNOSIS — F419 Anxiety disorder, unspecified: Secondary | ICD-10-CM

## 2011-07-16 DIAGNOSIS — R5383 Other fatigue: Secondary | ICD-10-CM

## 2011-07-16 DIAGNOSIS — Z23 Encounter for immunization: Secondary | ICD-10-CM

## 2011-07-16 DIAGNOSIS — F411 Generalized anxiety disorder: Secondary | ICD-10-CM

## 2011-07-16 MED ORDER — ASPIRIN EC 81 MG PO TBEC: 81.0000 mg | DELAYED_RELEASE_TABLET | Freq: Every day | ORAL | Status: AC

## 2011-07-16 MED ORDER — POTASSIUM CHLORIDE ER 10 MEQ PO TBCR: 10.0000 meq | EXTENDED_RELEASE_TABLET | Freq: Every day | ORAL | Status: DC

## 2011-07-16 MED ORDER — NITROGLYCERIN 0.4 MG SL SUBL: 0.4000 mg | SUBLINGUAL_TABLET | SUBLINGUAL | Status: DC | PRN

## 2011-07-16 MED ORDER — ALPRAZOLAM 0.25 MG PO TABS: 0.2500 mg | ORAL_TABLET | Freq: Three times a day (TID) | ORAL | Status: DC | PRN

## 2011-07-18 ENCOUNTER — Other Ambulatory Visit: Payer: Self-pay | Admitting: Family Medicine

## 2011-07-18 DIAGNOSIS — R55 Syncope and collapse: Secondary | ICD-10-CM

## 2011-07-21 ENCOUNTER — Encounter (INDEPENDENT_AMBULATORY_CARE_PROVIDER_SITE_OTHER): Payer: Medicare HMO | Admitting: Cardiology

## 2011-07-21 DIAGNOSIS — I6529 Occlusion and stenosis of unspecified carotid artery: Secondary | ICD-10-CM

## 2011-07-21 DIAGNOSIS — R55 Syncope and collapse: Secondary | ICD-10-CM

## 2011-07-22 ENCOUNTER — Encounter: Payer: Self-pay | Admitting: Family Medicine

## 2011-07-22 DIAGNOSIS — R0789 Other chest pain: Secondary | ICD-10-CM | POA: Insufficient documentation

## 2011-07-22 NOTE — Progress Notes (Signed)
Elizabeth Mcintyre [id number] 1941-01-11 07/22/2011      Progress Note-Follow Up  Subjective  Chief Complaint  Chief Complaint  Patient presents with  . Follow-up    3 month follow up    HPI  Patient is a 70 yo female in today for follow up noting some concerning symptoms. She is noting some mild chest discomfort but also increased fatigue, malaise, dizziness as well. She is a known diabetic, hypertensive and has hi cholesterol. She notes she is having worsening trouble with poor and fitful sleep. Has trouble falling and staying asleep. She has noted some increased head congestion and cough with SOB for the past 2 weeks but she feels it is improving. No fevers, chills, sputum production. No GI or GU c/o  Past Medical History  Diagnosis Date  . Diabetes mellitus     type 2  . Hyperlipidemia   . Hypertension   . Asthma   . NECK AND BACK PAIN 11/26/2010  . HYPERTENSION 11/09/2007  . HYPERLIPIDEMIA 11/09/2007  . HIP PAIN, RIGHT 11/26/2010  . HEMATURIA UNSPECIFIED 11/26/2010  . Vaginitis 01/01/2011  . ASTHMA 11/09/2007  . ALLERGIC RHINITIS CAUSE UNSPECIFIED 11/26/2010  . DIABETES MELLITUS, TYPE II 11/09/2007  . 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 Lymphadenopathy 04/03/2011  . Diverticulosis 03/2011  . Fatigue 07/22/2011  . Atypical chest pain 07/22/2011    Past Surgical History  Procedure Date  . Abdominal hysterectomy   . Cardiac catheterization     X 2, last one in 1998  . Colonoscopy   . Upper gastrointestinal endoscopy     Family History  Problem Relation Age of Onset  . Heart disease Mother   . Osteoarthritis Mother   . Sudden death Father   . Single kidney Father   . Other Father     h/o severe MVA injuries  . Hyperlipidemia Sister   . Other Daughter     Myalgias  . Fibromyalgia Daughter   . Allergies Daughter   . Heart disease Maternal Grandfather   . Sudden death Paternal Grandmother   . Diabetes Paternal Grandfather   . Heart disease Daughter   . Other Daughter      palpitations    History   Social History  . Marital Status: Widowed    Spouse Name: N/A    Number of Children: N/A  . Years of Education: N/A   Occupational History  . Not on file.   Social History Main Topics  . Smoking status: Never Smoker   . Smokeless tobacco: Never Used  . Alcohol Use: No  . Drug Use: No  . Sexually Active: No   Other Topics Concern  . Not on file   Social History Narrative  . No narrative on file    Current Outpatient Prescriptions on File Prior to Visit  Medication Sig Dispense Refill  . clobetasol (TEMOVATE) 0.05 % cream Apply topically at bedtime as needed. Apply a small amount vaginally at bedtime for next week and then use sparingly qhs for any itching  30 g  1  . FLUoxetine (PROZAC) 20 MG capsule Take 1 capsule (20 mg total) by mouth daily.  30 capsule  2  . glucose blood test strip 1 each by Other route as needed. Accu-Check Aviva Strips- check blood sugars bid and prn       . losartan-hydrochlorothiazide (HYZAAR) 100-25 MG per tablet TAKE 1 TABLET BY MOUTH EVERY DAY  90 tablet  0  . metFORMIN (GLUCOPHAGE) 500 MG tablet Take  1 tablet (500 mg total) by mouth 2 (two) times daily with a meal.  180 tablet  3  . NON FORMULARY Accu-Chek Lancets misc- Check blood sugars bid and prn       . NON FORMULARY Accu-Check Aviva Kit       . NON FORMULARY BD Disp Needles 30G X 1/2" misc       . PREMARIN vaginal cream Apply topically Every Sunday.      . rosuvastatin (CRESTOR) 40 MG tablet Take 1 tablet (40 mg total) by mouth daily.  30 tablet  3    Allergies  Allergen Reactions  . Codeine Phosphate     Review of Systems  Review of Systems  Constitutional: Positive for malaise/fatigue. Negative for fever.  HENT: Negative for congestion.   Eyes: Negative for discharge.  Respiratory: Positive for cough and shortness of breath.   Cardiovascular: Positive for chest pain. Negative for palpitations and leg swelling.  Gastrointestinal: Negative for  nausea, abdominal pain and diarrhea.  Genitourinary: Negative for dysuria.  Musculoskeletal: Negative for falls.  Skin: Negative for rash.  Neurological: Positive for dizziness. Negative for loss of consciousness and headaches.  Endo/Heme/Allergies: Negative for polydipsia.  Psychiatric/Behavioral: Negative for depression and suicidal ideas. The patient has insomnia. The patient is not nervous/anxious.     Objective  BP 148/90  Pulse 78  Temp(Src) 98.1 F (36.7 C) (Oral)  Ht 5\' 3"  (1.6 m)  Wt 137 lb 12.8 oz (62.506 kg)  BMI 24.41 kg/m2  SpO2 96%  Physical Exam  Physical Exam  Constitutional: She is oriented to person, place, and time and well-developed, well-nourished, and in no distress. No distress.  HENT:  Head: Normocephalic and atraumatic.  Eyes: Conjunctivae are normal.  Neck: Neck supple. No thyromegaly present.  Cardiovascular: Normal rate, regular rhythm and normal heart sounds.   Pulmonary/Chest: Effort normal and breath sounds normal. She has no wheezes.  Abdominal: She exhibits no distension and no mass.  Musculoskeletal: She exhibits no edema.  Lymphadenopathy:    She has no cervical adenopathy.  Neurological: She is alert and oriented to person, place, and time.  Skin: Skin is warm and dry. No rash noted. She is not diaphoretic.  Psychiatric: Memory, affect and judgment normal.    Lab Results  Component Value Date   TSH 3.04 07/11/2011   Lab Results  Component Value Date   WBC 6.6 07/11/2011   HGB 12.9 07/11/2011   HCT 36.9 07/11/2011   MCV 83.3 07/11/2011   PLT 210 07/11/2011   Lab Results  Component Value Date   CREATININE 1.2 07/11/2011   BUN 16 07/11/2011   NA 139 07/11/2011   K 3.4* 07/11/2011   CL 103 07/11/2011   CO2 26 07/11/2011   Lab Results  Component Value Date   ALT 18 07/11/2011   AST 25 07/11/2011   ALKPHOS 45 07/11/2011   BILITOT 0.5 07/11/2011   Lab Results  Component Value Date   CHOL 123 07/11/2011   Lab Results    Component Value Date   HDL 56.20 07/11/2011   Lab Results  Component Value Date   LDLCALC 31 07/11/2011   Lab Results  Component Value Date   TRIG 177.0* 07/11/2011   Lab Results  Component Value Date   CHOLHDL 2 07/11/2011     Assessment & Plan  Fatigue Patient with fitful sleep, snoring and persistent fatigue she agrees to a sleep study to further investigate.  HYPERTENSION Mild elevation on current meds. No changes  today but she is encouraged to try the DASH diet and she will notify us of persistently elevated bp.   Atypical chest pain She describes it as mild discomfort but also describes fatigue, malaise and dizziness and multiple concerning risk factors including diabetes, HTN, and hyperlipidemia. Referred to cardiology for further evaluation and testing as indicated

## 2011-07-22 NOTE — Assessment & Plan Note (Addendum)
She describes it as mild discomfort but also describes fatigue, malaise and dizziness and multiple concerning risk factors including diabetes, HTN, and hyperlipidemia. Referred to cardiology for further evaluation and testing as indicated

## 2011-07-22 NOTE — Assessment & Plan Note (Signed)
Patient with fitful sleep, snoring and persistent fatigue she agrees to a sleep study to further investigate.

## 2011-07-22 NOTE — Assessment & Plan Note (Signed)
Mild elevation on current meds. No changes today but she is encouraged to try the DASH diet and she will notify us of persistently elevated bp.

## 2011-07-30 ENCOUNTER — Ambulatory Visit (INDEPENDENT_AMBULATORY_CARE_PROVIDER_SITE_OTHER): Payer: Medicare HMO | Admitting: Cardiovascular Disease

## 2011-07-30 ENCOUNTER — Encounter: Payer: Self-pay | Admitting: Cardiovascular Disease

## 2011-07-30 VITALS — BP 148/78 | HR 70 | Ht 63.0 in | Wt 136.8 lb

## 2011-07-30 DIAGNOSIS — R0789 Other chest pain: Secondary | ICD-10-CM

## 2011-07-30 NOTE — Progress Notes (Signed)
Cyndal BREUNNA NORDMANN Date of Birth  [birth year]  [facility] [address]    [address] [place], [place]  [address] [phone]  Fax  [phone]  History of Present Illness:  Elizabeth Mcintyre is a 70 year old female with a history of diabetes mellitus, hyperlipidemia, and hypertension. She is referred to [place] today for further evaluation of some chest pain.  She describes it as a pain in the center of her chest. It tends to radiate up to the left side of her jaw and down to her arm.  It typically occurs with rest. It may be exacerbated slightly by lying down.  She tries to walk on regular basis but has been limited by severe dyspnea with exertion and extreme fatigue.  She's to walk 2 or 3 miles a day but has not been able to in the past several years.  She was in the hospital last year with similar complaints. A stress Myoview study was normal. She has normal left ventricular systolic function. There was no evidence of ischemia.   Current Outpatient Prescriptions on File Prior to Visit  Medication Sig Dispense Refill  . ALPRAZolam (XANAX) 0.25 MG tablet Take 1 tablet (0.25 mg total) by mouth 3 (three) times daily as needed for anxiety.  30 tablet  0  . aspirin EC 81 MG tablet Take 1 tablet (81 mg total) by mouth daily.  150 tablet  2  . clobetasol (TEMOVATE) 0.05 % cream Apply topically at bedtime as needed. Apply a small amount vaginally at bedtime for next week and then use sparingly qhs for any itching  30 g  1  . FLUoxetine (PROZAC) 20 MG capsule Take 1 capsule (20 mg total) by mouth daily.  30 capsule  2  . glucose blood test strip 1 each by Other route as needed. Accu-Check Aviva Strips- check blood sugars bid and prn       . losartan-hydrochlorothiazide (HYZAAR) 100-25 MG per tablet TAKE 1 TABLET BY MOUTH EVERY DAY  90 tablet  0  . metFORMIN (GLUCOPHAGE) 500 MG tablet Take 1 tablet (500 mg total) by mouth 2 (two) times daily with a meal.  180 tablet  3  . nitroGLYCERIN (NITROSTAT) 0.4 MG SL  tablet Place 1 tablet (0.4 mg total) under the tongue every 5 (five) minutes as needed for chest pain.  90 tablet  12  . NON FORMULARY Accu-Chek Lancets misc- Check blood sugars bid and prn       . NON FORMULARY Accu-Check Aviva Kit       . NON FORMULARY BD Disp Needles 30G X 1/2" misc       . potassium chloride (K-DUR) 10 MEQ tablet Take 1 tablet (10 mEq total) by mouth daily.  30 tablet  2  . PREMARIN vaginal cream Apply topically Every Sunday.      . rosuvastatin (CRESTOR) 40 MG tablet Take 1 tablet (40 mg total) by mouth daily.  30 tablet  3    Allergies  Allergen Reactions  . Codeine Phosphate     Past Medical History  Diagnosis Date  . Diabetes mellitus     type 2  . Hyperlipidemia   . Hypertension   . Asthma   . NECK AND BACK PAIN 11/26/2010  . HYPERTENSION 11/09/2007  . HYPERLIPIDEMIA 11/09/2007  . HIP PAIN, RIGHT 11/26/2010  . HEMATURIA UNSPECIFIED 11/26/2010  . Vaginitis 01/01/2011  . ASTHMA 11/09/2007  . ALLERGIC RHINITIS CAUSE UNSPECIFIED 11/26/2010  . DIABETES MELLITUS, TYPE II 11/09/2007  . Depression with  anxiety 04/03/2011  . Lymphadenopathy 04/03/2011  . Diverticulosis 03/2011  . Fatigue 07/22/2011  . Atypical chest pain 07/22/2011    Past Surgical History  Procedure Date  . Abdominal hysterectomy   . Cardiac catheterization     X 2, last one in 1998  . Colonoscopy   . Upper gastrointestinal endoscopy     History  Smoking status  . Never Smoker   Smokeless tobacco  . Never Used    History  Alcohol Use No    Family History  Problem Relation Age of Onset  . Heart disease Mother   . Osteoarthritis Mother   . Sudden death Father   . Single kidney Father   . Other Father     h/o severe MVA injuries  . Hyperlipidemia Sister   . Other Daughter     Myalgias  . Fibromyalgia Daughter   . Allergies Daughter   . Heart disease Maternal Grandfather   . Sudden death Paternal Grandmother   . Diabetes Paternal Grandfather   . Heart disease Daughter   .  Other Daughter     palpitations    Reviw of Systems:  Reviewed in the HPI.  All other systems are negative.  Physical Exam: BP 148/78  Pulse 70  Ht 5\' 3"  (1.6 m)  Wt 136 lb 12.8 oz (62.052 kg)  BMI 24.23 kg/m2 The patient is alert and oriented x 3.  The mood and affect are normal.   Skin: warm and dry.  Color is normal.    HEENT:   The carotids are 2+ without bruits. There is no JVD. Her neck is supple.  Lungs: Her lungs are clear to auscultation. There is no wheezing.   Heart: Regular rate S1-S2. She has no murmurs.    Abdomen: Good bowel sounds. There is no hepatosplenomegaly.  Extremities:  No clubbing cyanosis or edema.  Neuro:  Her neuro exam is nonfocal.    ECG: Normal sinus rhythm. She has a right ventricular conduction delay. She has septal Q waves.  Assessment / Plan:

## 2011-07-30 NOTE — Patient Instructions (Signed)
Your physician recommends that you schedule a follow-up appointment in: 2 months  

## 2011-07-30 NOTE — Assessment & Plan Note (Signed)
Elizabeth Mcintyre presents with a history of chest pain accompanied by severe dyspnea with exertion and generalized fatigue. She had a stress Myoview study in the hospital last year for similar symptoms and the stress Myoview study was completely normal. She also had some complications with D. Lexus scan involving hypertension and she required 500 cc of normal saline to get her blood pressure back up.  She really does not think this is cardiac. She has a very strong family history of cardiac problems in both parents died of heart attacks.  I suggested that we start with a stress test. We may need to proceed directly to cardiac catheterization if she continues to have these problems. She would like to hold off on getting the stress text and would like to start an exercise program. I've asked her to start walking 5-10 minutes a day and increase that amount by 5 minutes  every 3-4 days. By the time I see her again, she should be at walking 2-3 miles every day.  She has a prescription for nitroglycerin from her medical Dr.  Rennis Petty also suggested that she try taking some Prilosec or a similar medication for her chest pain which might be due to esophageal reflux.

## 2011-08-14 ENCOUNTER — Ambulatory Visit: Payer: Medicare HMO | Admitting: Family Medicine

## 2011-08-21 ENCOUNTER — Other Ambulatory Visit: Payer: Self-pay | Admitting: Family Medicine

## 2011-10-01 ENCOUNTER — Ambulatory Visit: Payer: Medicare HMO | Admitting: Cardiovascular Disease

## 2011-10-23 ENCOUNTER — Ambulatory Visit (INDEPENDENT_AMBULATORY_CARE_PROVIDER_SITE_OTHER): Payer: Medicare Other | Admitting: Cardiovascular Disease

## 2011-10-23 ENCOUNTER — Encounter: Payer: Self-pay | Admitting: Cardiovascular Disease

## 2011-10-23 VITALS — BP 163/84 | HR 88 | Ht 63.0 in | Wt 143.4 lb

## 2011-10-23 DIAGNOSIS — R0789 Other chest pain: Secondary | ICD-10-CM

## 2011-10-23 NOTE — Progress Notes (Signed)
Elizabeth Mcintyre Date of Birth  [birth year] [facility]     [place] Office  [address]    [address]   [address] [place], [place]  [address]    [place], [place]  [address] [phone]  Fax  [phone]  [phone]  Fax [phone]  1. Diabetes mellitus 2. Hypertension 3. Hypertension 4. Chest pain-normal stress Myoview study March, 2011  History of Present Illness:  Elizabeth Mcintyre has done well since I last saw her. She has started an exercise routine and seems to be making good progress. She's been taking omeprazole with good results.  She has had a cough for the past week or so.    Current Outpatient Prescriptions on File Prior to Visit  Medication Sig Dispense Refill  . ACCU-CHEK AVIVA test strip CHECK BLOOD SUGAR TWICE A DAY AND AS NEEDED  100 each  2  . ALPRAZolam (XANAX) 0.25 MG tablet Take 1 tablet (0.25 mg total) by mouth 3 (three) times daily as needed for anxiety.  30 tablet  0  . aspirin EC 81 MG tablet Take 1 tablet (81 mg total) by mouth daily.  150 tablet  2  . clobetasol (TEMOVATE) 0.05 % cream Apply topically at bedtime as needed. Apply a small amount vaginally at bedtime for next week and then use sparingly qhs for any itching  30 g  1  . losartan-hydrochlorothiazide (HYZAAR) 100-25 MG per tablet TAKE 1 TABLET BY MOUTH EVERY DAY  90 tablet  0  . metFORMIN (GLUCOPHAGE) 500 MG tablet Take 1 tablet (500 mg total) by mouth 2 (two) times daily with a meal.  180 tablet  3  . nitroGLYCERIN (NITROSTAT) 0.4 MG SL tablet Place 1 tablet (0.4 mg total) under the tongue every 5 (five) minutes as needed for chest pain.  90 tablet  12  . NON FORMULARY Accu-Chek Lancets misc- Check blood sugars bid and prn       . NON FORMULARY Accu-Check Aviva Kit       . NON FORMULARY BD Disp Needles 30G X 1/2" misc       . potassium chloride (K-DUR) 10 MEQ tablet Take 1 tablet (10 mEq total) by mouth daily.  30 tablet  2  . PREMARIN vaginal cream Apply topically Every Sunday.       . rosuvastatin (CRESTOR) 40 MG tablet Take 1 tablet (40 mg total) by mouth daily.  30 tablet  3    Allergies  Allergen Reactions  . Codeine Phosphate     Past Medical History  Diagnosis Date  . Diabetes mellitus     type 2  . Hyperlipidemia   . Hypertension   . Asthma   . NECK AND BACK PAIN 11/26/2010  . HYPERTENSION 11/09/2007  . HYPERLIPIDEMIA 11/09/2007  . HIP PAIN, RIGHT 11/26/2010  . HEMATURIA UNSPECIFIED 11/26/2010  . Vaginitis 01/01/2011  . ASTHMA 11/09/2007  . ALLERGIC RHINITIS CAUSE UNSPECIFIED 11/26/2010  . DIABETES MELLITUS, TYPE II 11/09/2007  . 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 Lymphadenopathy 04/03/2011  . Diverticulosis 03/2011  . Fatigue 07/22/2011  . Atypical chest pain 07/22/2011    Past Surgical History  Procedure Date  . Abdominal hysterectomy   . Cardiac catheterization     X 2, last one in 1998  . Colonoscopy   . Upper gastrointestinal endoscopy     History  Smoking status  . Never Smoker   Smokeless tobacco  . Never Used    History  Alcohol Use No  Family History  Problem Relation Age of Onset  . Heart disease Mother   . Osteoarthritis Mother   . Sudden death Father   . Single kidney Father   . Other Father     h/o severe MVA injuries  . Hyperlipidemia Sister   . Other Daughter     Myalgias  . Fibromyalgia Daughter   . Allergies Daughter   . Heart disease Maternal Grandfather   . Sudden death Paternal Grandmother   . Diabetes Paternal Grandfather   . Heart disease Daughter   . Other Daughter     palpitations    Reviw of Systems:  Reviewed in the HPI.  All other systems are negative.  Physical Exam: Blood pressure 163/84, pulse 88, height 5\' 3"  (1.6 m), weight 143 lb 6.4 oz (65.046 kg). General: Well developed, well nourished, in no acute distress.  Head: Normocephalic, atraumatic, sclera non-icteric, mucus membranes are moist,   Neck: Supple. Negative for carotid bruits. JVD not elevated.  Lungs: Clear bilaterally to  auscultation without wheezes, rales, or rhonchi. Breathing is unlabored.  Heart: RRR with S1 S2. No murmurs, rubs, or gallops appreciated.  Abdomen: Soft, non-tender, non-distended with normoactive bowel sounds. No hepatomegaly. No rebound/guarding. No obvious abdominal masses.  Msk:  Strength and tone appear normal for age.  Extremities: No clubbing or cyanosis. No edema.  Distal pedal pulses are 2+ and equal bilaterally.  Neuro: Alert and oriented X 3. Moves all extremities spontaneously.  Psych:  Responds to questions appropriately with a normal affect.  ECG:  Assessment / Plan:

## 2011-10-23 NOTE — Assessment & Plan Note (Signed)
Elizabeth Mcintyre has done very well. She has a history of atypical chest pain but had a negative Myoview study. She started an exercise program and is now walking quite regularly without any episodes of chest pain. I think that the omeprazole has helped her quite a bit.  I will see her on an as-needed basis. I will be happy to see her in the future for any additional cardiac problems.

## 2011-10-23 NOTE — Patient Instructions (Signed)
Your physician recommends that you schedule a follow-up appointment in: AS NEEDED BASIS  

## 2011-12-05 ENCOUNTER — Ambulatory Visit (INDEPENDENT_AMBULATORY_CARE_PROVIDER_SITE_OTHER): Payer: Medicare Other | Admitting: Family Medicine

## 2011-12-05 ENCOUNTER — Encounter: Payer: Self-pay | Admitting: Family Medicine

## 2011-12-05 VITALS — BP 146/94 | HR 78 | Temp 98.8°F | Ht 63.0 in | Wt 143.0 lb

## 2011-12-05 DIAGNOSIS — H539 Unspecified visual disturbance: Secondary | ICD-10-CM

## 2011-12-05 DIAGNOSIS — E785 Hyperlipidemia, unspecified: Secondary | ICD-10-CM

## 2011-12-05 DIAGNOSIS — R059 Cough, unspecified: Secondary | ICD-10-CM

## 2011-12-05 DIAGNOSIS — J019 Acute sinusitis, unspecified: Secondary | ICD-10-CM

## 2011-12-05 DIAGNOSIS — J329 Chronic sinusitis, unspecified: Secondary | ICD-10-CM

## 2011-12-05 DIAGNOSIS — R05 Cough: Secondary | ICD-10-CM

## 2011-12-05 DIAGNOSIS — I10 Essential (primary) hypertension: Secondary | ICD-10-CM

## 2011-12-05 DIAGNOSIS — F419 Anxiety disorder, unspecified: Secondary | ICD-10-CM

## 2011-12-05 DIAGNOSIS — F341 Dysthymic disorder: Secondary | ICD-10-CM

## 2011-12-05 DIAGNOSIS — F411 Generalized anxiety disorder: Secondary | ICD-10-CM

## 2011-12-05 DIAGNOSIS — F418 Other specified anxiety disorders: Secondary | ICD-10-CM

## 2011-12-05 MED ORDER — AMOXICILLIN-POT CLAVULANATE 875-125 MG PO TABS: 1.0000 | ORAL_TABLET | Freq: Two times a day (BID) | ORAL | Status: AC

## 2011-12-05 MED ORDER — ALPRAZOLAM 0.25 MG PO TABS: 0.2500 mg | ORAL_TABLET | Freq: Three times a day (TID) | ORAL | Status: DC | PRN

## 2011-12-05 MED ORDER — ALBUTEROL SULFATE HFA 108 (90 BASE) MCG/ACT IN AERS: 2.0000 | INHALATION_SPRAY | Freq: Four times a day (QID) | RESPIRATORY_TRACT | Status: DC | PRN

## 2011-12-05 MED ORDER — NEBIVOLOL HCL 5 MG PO TABS: 2.5000 mg | ORAL_TABLET | Freq: Every day | ORAL | Status: DC

## 2011-12-05 MED ORDER — METHYLPREDNISOLONE 4 MG PO KIT: PACK | ORAL | Status: DC

## 2011-12-05 MED ORDER — METHYLPREDNISOLONE 4 MG PO KIT: PACK | ORAL | Status: AC

## 2011-12-05 MED ORDER — ROSUVASTATIN CALCIUM 40 MG PO TABS: 40.0000 mg | ORAL_TABLET | Freq: Every day | ORAL | Status: DC

## 2011-12-05 MED ORDER — GUAIFENESIN ER 600 MG PO TB12: 1200.0000 mg | ORAL_TABLET | Freq: Two times a day (BID) | ORAL | Status: DC

## 2011-12-05 MED ORDER — AMOXICILLIN-POT CLAVULANATE 875-125 MG PO TABS: 1.0000 | ORAL_TABLET | Freq: Two times a day (BID) | ORAL | Status: DC

## 2011-12-05 NOTE — Assessment & Plan Note (Signed)
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 As a result she has usable for alprazolam more recently. She's given a refill today to use as needed over the next several days and we will see her in followup.

## 2011-12-05 NOTE — Progress Notes (Signed)
Patient ID: Elizabeth Mcintyre, female   DOB: [birth year], 71 y.o.   MRN: [id number] [name] [id number] [birth year] 12/05/2011      Progress Note-Follow Up  Subjective  Chief Complaint  Chief Complaint  Patient presents with  . URI    nasal congestion, pressure x 2 weeks, was given abx by [facility]    HPI  This is a 61 female who is in today for evaluation of sinusitis. Couple months ago she had a bad upper respiratory infection but felt that it is largely resolved. Then over the last 2 weeks she's developed worse nasal congestion and ultimately sinus pain and pressure. She went to [facility] in a clinic several days ago and was placed on some Augmentin but does not feels is getting notably better. Denies fevers and chills but hasn't had right-sided facial headache pressure and inability to breathe through her nose due to congestion and right ear pain. No decrease in her hearing on the right as well and some postnasal drip. She is feeling very stressed today has not slept well in the last several days due to her elderly 17 her antrum she provides care being placed in his care secondary to fibrosis and respiratory failure. She is seated upright on for the first time in quite some time and part of this episode and fell she was handling her depression and anxiety well. She is also struggling with persistent fatigue and myalgias as well.  Past Medical History  Diagnosis Date  . Diabetes mellitus     type 2  . Hyperlipidemia   . Hypertension   . Asthma   . NECK AND BACK PAIN 11/26/2010  . HYPERTENSION 11/09/2007  . HYPERLIPIDEMIA 11/09/2007  . HIP PAIN, RIGHT 11/26/2010  . HEMATURIA UNSPECIFIED 11/26/2010  . Vaginitis 01/01/2011  . ASTHMA 11/09/2007  . ALLERGIC RHINITIS CAUSE UNSPECIFIED 11/26/2010  . DIABETES MELLITUS, TYPE II 11/09/2007  . 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 Lymphadenopathy 04/03/2011  . Diverticulosis 03/2011  . Fatigue 07/22/2011  . Atypical chest pain 07/22/2011  . Sinusitis  acute 12/05/2011  . Visual disturbance of one eye 12/05/2011    Past Surgical History  Procedure Date  . Abdominal hysterectomy   . Cardiac catheterization     X 2, last one in 1998  . Colonoscopy   . Upper gastrointestinal endoscopy     Family History  Problem Relation Age of Onset  . Heart disease Mother   . Osteoarthritis Mother   . Sudden death Father   . Single kidney Father   . Other Father     h/o severe MVA injuries  . Hyperlipidemia Sister   . Other Daughter     Myalgias  . Fibromyalgia Daughter   . Allergies Daughter   . Heart disease Maternal Grandfather   . Sudden death Paternal Grandmother   . Diabetes Paternal Grandfather   . Heart disease Daughter   . Other Daughter     palpitations    History   Social History  . Marital Status: Widowed    Spouse Name: N/A    Number of Children: N/A  . Years of Education: N/A   Occupational History  . Not on file.   Social History Main Topics  . Smoking status: Never Smoker   . Smokeless tobacco: Never Used  . Alcohol Use: No  . Drug Use: No  . Sexually Active: No   Other Topics Concern  . Not on file   Social History Narrative  .  No narrative on file    Current Outpatient Prescriptions on File Prior to Visit  Medication Sig Dispense Refill  . ACCU-CHEK AVIVA test strip CHECK BLOOD SUGAR TWICE A DAY AND AS NEEDED  100 each  2  . aspirin EC 81 MG tablet Take 1 tablet (81 mg total) by mouth daily.  150 tablet  2  . clobetasol (TEMOVATE) 0.05 % cream Apply topically at bedtime as needed. Apply a small amount vaginally at bedtime for next week and then use sparingly qhs for any itching  30 g  1  . losartan-hydrochlorothiazide (HYZAAR) 100-25 MG per tablet TAKE 1 TABLET BY MOUTH EVERY DAY  90 tablet  0  . metFORMIN (GLUCOPHAGE) 500 MG tablet Take 1 tablet (500 mg total) by mouth 2 (two) times daily with a meal.  180 tablet  3  . nitroGLYCERIN (NITROSTAT) 0.4 MG SL tablet Place 1 tablet (0.4 mg total) under the  tongue every 5 (five) minutes as needed for chest pain.  90 tablet  12  . NON FORMULARY Accu-Chek Lancets misc- Check blood sugars bid and prn       . NON FORMULARY Accu-Check Aviva Kit       . NON FORMULARY BD Disp Needles 30G X 1/2" misc       . potassium chloride (K-DUR) 10 MEQ tablet Take 1 tablet (10 mEq total) by mouth daily.  30 tablet  2  . PREMARIN vaginal cream Apply topically Every Sunday.        Allergies  Allergen Reactions  . Codeine Phosphate     Review of Systems  Review of Systems  Constitutional: Positive for malaise/fatigue. Negative for fever and chills.  HENT: Positive for ear pain and congestion.   Eyes: Negative for blurred vision, double vision, photophobia, pain and discharge.  Respiratory: Positive for cough and sputum production. Negative for shortness of breath.   Cardiovascular: Negative for chest pain, palpitations and leg swelling.  Gastrointestinal: Negative for nausea, abdominal pain and diarrhea.  Genitourinary: Negative for dysuria.  Musculoskeletal: Positive for myalgias. Negative for falls.  Skin: Negative for rash.  Neurological: Positive for headaches. Negative for loss of consciousness.  Endo/Heme/Allergies: Negative for polydipsia.  Psychiatric/Behavioral: Negative for depression and suicidal ideas. The patient is nervous/anxious and has insomnia.     Objective  BP 146/94  Pulse 78  Temp(Src) 98.8 F (37.1 C) (Temporal)  Ht 5\' 3"  (1.6 m)  Wt 143 lb (64.864 kg)  BMI 25.33 kg/m2  SpO2 100%  Physical Exam  Physical Exam  Constitutional: She is oriented to person, place, and time and well-developed, well-nourished, and in no distress. No distress.  HENT:  Head: Normocephalic and atraumatic.       Right TM yellowish fluid behind, intact, left TM clear. Nasal mucosa boggy and erythematous  Eyes: Conjunctivae and EOM are normal. Pupils are equal, round, and reactive to light. Right eye exhibits no discharge. Left eye exhibits no  discharge. No scleral icterus.  Neck: Neck supple. No thyromegaly present.       B/l r>l  Cardiovascular: Normal rate, regular rhythm and normal heart sounds.   No murmur heard. Pulmonary/Chest: Effort normal and breath sounds normal. She has no wheezes.  Abdominal: She exhibits no distension and no mass.  Musculoskeletal: She exhibits no edema.  Lymphadenopathy:    She has cervical adenopathy.  Neurological: She is alert and oriented to person, place, and time.  Skin: Skin is warm and dry. No rash noted. She is not diaphoretic.  Psychiatric: Memory, affect and judgment normal.    Lab Results  Component Value Date   TSH 3.04 07/11/2011   Lab Results  Component Value Date   WBC 6.6 07/11/2011   HGB 12.9 07/11/2011   HCT 36.9 07/11/2011   MCV 83.3 07/11/2011   PLT 210 07/11/2011   Lab Results  Component Value Date   CREATININE 1.2 07/11/2011   BUN 16 07/11/2011   NA 139 07/11/2011   K 3.4* 07/11/2011   CL 103 07/11/2011   CO2 26 07/11/2011   Lab Results  Component Value Date   ALT 18 07/11/2011   AST 25 07/11/2011   ALKPHOS 45 07/11/2011   BILITOT 0.5 07/11/2011   Lab Results  Component Value Date   CHOL 123 07/11/2011   Lab Results  Component Value Date   HDL 56.20 07/11/2011   Lab Results  Component Value Date   LDLCALC 31 07/11/2011   Lab Results  Component Value Date   TRIG 177.0* 07/11/2011   Lab Results  Component Value Date   CHOLHDL 2 07/11/2011     Assessment & Plan  HYPERTENSION Poorly controlled due to poor sleep and increased stress, given samples of Bystolic 5 mg to take 1/2 tab po daily and reassess in 2 weeks  Depression with anxiety Was doing better not needing medications and then in the last couple of days per 35 year old aunt for whom she provides much care as 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 As a result she has usable for alprazolam more recently. She's given a  refill today to use as needed over the next several days and we will see her in followup.  Sinusitis acute Was seen at [facility] and was given a short course of Augmentin. We will have her continue that for longer and add a steroid pack at this time. She is also to take Mucinex twice a day and increase her fluids and rest. Report if symptoms worsen  Visual disturbance of one eye Describes a small squiggly line in her right field vision this week, no pain or redness or trauma associated and she believes it is improving. Patient agrees to contact her eye doctor for further evaluation

## 2011-12-05 NOTE — Assessment & Plan Note (Signed)
Poorly controlled due to poor sleep and increased stress, given samples of Bystolic 5 mg to take 1/2 tab po daily and reassess in 2 weeks

## 2011-12-05 NOTE — Assessment & Plan Note (Signed)
Was seen at [facility] and was given a short course of Augmentin. We will have her continue that for longer and add a steroid pack at this time. She is also to take Mucinex twice a day and increase her fluids and rest. Report if symptoms worsen

## 2011-12-05 NOTE — Patient Instructions (Signed)
Sinusitis Sinuses are air pockets within the bones of your face. The growth of bacteria within a sinus leads to infection. The infection prevents the sinuses from draining. This infection is called sinusitis. SYMPTOMS  There will be different areas of pain depending on which sinuses have become infected.  The maxillary sinuses often produce pain beneath the eyes.   Frontal sinusitis may cause pain in the middle of the forehead and above the eyes.  Other problems (symptoms) include:  Toothaches.   Colored, pus-like (purulent) drainage from the nose.   Swelling, warmth, and tenderness over the sinus areas may be signs of infection.  TREATMENT  Sinusitis is most often determined by an exam.X-rays may be taken. If x-rays have been taken, make sure you obtain your results or find out how you are to obtain them. Your caregiver may give you medications (antibiotics). These are medications that will help kill the bacteria causing the infection. You may also be given a medication (decongestant) that helps to reduce sinus swelling.  HOME CARE INSTRUCTIONS   Only take over-the-counter or prescription medicines for pain, discomfort, or fever as directed by your caregiver.   Drink extra fluids. Fluids help thin the mucus so your sinuses can drain more easily.   Applying either moist heat or ice packs to the sinus areas may help relieve discomfort.   Use saline nasal sprays to help moisten your sinuses. The sprays can be found at your local drugstore.  SEEK IMMEDIATE MEDICAL CARE IF:  You have a fever.   You have increasing pain, severe headaches, or toothache.   You have nausea, vomiting, or drowsiness.   You develop unusual swelling around the face or trouble seeing.  MAKE SURE YOU:   Understand these instructions.   Will watch your condition.   Will get help right away if you are not doing well or get worse.  Document Released: 09/01/2005 Document Revised: 08/21/2011 Document Reviewed:  03/31/2007 [facility] Patient Information 2012 [place], [place].    CALL YOUR EYE DOCTOR

## 2011-12-05 NOTE — Assessment & Plan Note (Signed)
Describes a small squiggly line in her right field vision this week, no pain or redness or trauma associated and she believes it is improving. Patient agrees to contact her eye doctor for further evaluation

## 2011-12-19 ENCOUNTER — Ambulatory Visit: Payer: Medicare Other | Admitting: Family Medicine

## 2011-12-23 ENCOUNTER — Encounter: Payer: Self-pay | Admitting: Family Medicine

## 2011-12-23 ENCOUNTER — Ambulatory Visit (INDEPENDENT_AMBULATORY_CARE_PROVIDER_SITE_OTHER): Payer: Medicare Other | Admitting: Family Medicine

## 2011-12-23 VITALS — BP 130/70 | HR 66 | Temp 98.6°F | Ht 63.0 in | Wt 142.0 lb

## 2011-12-23 DIAGNOSIS — F341 Dysthymic disorder: Secondary | ICD-10-CM

## 2011-12-23 DIAGNOSIS — F3289 Other specified depressive episodes: Secondary | ICD-10-CM

## 2011-12-23 DIAGNOSIS — E119 Type 2 diabetes mellitus without complications: Secondary | ICD-10-CM

## 2011-12-23 DIAGNOSIS — F411 Generalized anxiety disorder: Secondary | ICD-10-CM

## 2011-12-23 DIAGNOSIS — F32A Depression, unspecified: Secondary | ICD-10-CM

## 2011-12-23 DIAGNOSIS — I10 Essential (primary) hypertension: Secondary | ICD-10-CM

## 2011-12-23 DIAGNOSIS — J019 Acute sinusitis, unspecified: Secondary | ICD-10-CM

## 2011-12-23 DIAGNOSIS — F418 Other specified anxiety disorders: Secondary | ICD-10-CM

## 2011-12-23 DIAGNOSIS — F329 Major depressive disorder, single episode, unspecified: Secondary | ICD-10-CM

## 2011-12-23 DIAGNOSIS — F419 Anxiety disorder, unspecified: Secondary | ICD-10-CM

## 2011-12-23 MED ORDER — FLUOXETINE HCL 20 MG PO TABS: 20.0000 mg | ORAL_TABLET | Freq: Every day | ORAL | Status: DC

## 2011-12-23 MED ORDER — ALPRAZOLAM 0.25 MG PO TABS: 0.2500 mg | ORAL_TABLET | Freq: Three times a day (TID) | ORAL | Status: DC | PRN

## 2011-12-23 NOTE — Assessment & Plan Note (Signed)
Her elderly aunt died the day after she was here for her last appt.. With the Fluoxetine and Alprazolam she feels she is doing OK. Will refill today

## 2011-12-23 NOTE — Assessment & Plan Note (Signed)
Resolved with treatment within 5 days of last appt

## 2011-12-23 NOTE — Patient Instructions (Signed)
Grief Reaction  Grief is a normal response to the death of someone close to you. Feelings of fear, anger, and guilt can affect almost everyone who loses someone they love. Symptoms of depression are also common. These include problems with sleep, loss of appetite, and lack of energy. These grief reaction symptoms often last for weeks to months after a loss. They may also return during special times that remind you of the person you lost, such as an anniversary or birthday.  Anxiety, insomnia, irritability, and deep depression may last beyond the period of normal grief. If you experience these feelings for 6 months or longer, you may have clinical depression. Clinical depression requires further medical attention. If you think that you have clinical depression, you should contact your caregiver. If you have a history of depression and or a family history of depression, you are at greater risk of clinical depression. You are also at greater risk of developing clinical depression if the loss was traumatic or the loss was of someone with whom you had unresolved issues.    A grief reaction can become complicated by being blocked. This means being unable to cry or express extreme emotions. This may prolong the grieving period and worsen the emotional effects of the loss. Mourning is a natural event in human life. A healthy grief reaction is one that is not blocked . It requires a time of sadness and readjustment.It is very important to share your sorrow and fear with others, especially close friends and family. Professional counselors and clergy can also help you process your grief.  Document Released: 09/01/2005 Document Revised: 08/21/2011 Document Reviewed: 05/12/2006  [employer] Patient Information 2012 [employer].

## 2011-12-23 NOTE — Assessment & Plan Note (Signed)
Well controlled recently no changes

## 2011-12-23 NOTE — Assessment & Plan Note (Signed)
Adequately controlled without Bystolic, will not restart today

## 2011-12-23 NOTE — Progress Notes (Signed)
Patient ID: Elizabeth Mcintyre, female   DOB: 25-Jan-1941, 71 y.o.   MRN: [id number] Elizabeth Mcintyre [id number] 06/19/1941 12/23/2011      Progress Note-Follow Up  Subjective  Chief Complaint  Chief Complaint  Patient presents with  . Follow-up    2 week    HPI  This is 7 caucasian female in today for follow up. The day after we saw her at her last visit her elderly and passed away. She does feel fluoxetine and alprazolam helped her get through this. Overall she feels she is doing well. She does not really have granted she had some diarrhea and anorexia felt very lightheaded and her blood pressure started to drop. She stopped it is dull and has done better since then. She denies any chest pain, palpitations, shortness of breath.   Past Medical History  Diagnosis Date  . Diabetes mellitus     type 2  . Hyperlipidemia   . Hypertension   . Asthma   . NECK AND BACK PAIN 11/26/2010  . HYPERTENSION 11/09/2007  . HYPERLIPIDEMIA 11/09/2007  . HIP PAIN, RIGHT 11/26/2010  . HEMATURIA UNSPECIFIED 11/26/2010  . Vaginitis 01/01/2011  . ASTHMA 11/09/2007  . ALLERGIC RHINITIS CAUSE UNSPECIFIED 11/26/2010  . DIABETES MELLITUS, TYPE II 11/09/2007  . 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 Lymphadenopathy 04/03/2011  . Diverticulosis 03/2011  . Fatigue 07/22/2011  . Atypical chest pain 07/22/2011  . Sinusitis acute 12/05/2011  . Visual disturbance of one eye 12/05/2011    Past Surgical History  Procedure Date  . Abdominal hysterectomy   . Cardiac catheterization     X 2, last one in 1998  . Colonoscopy   . Upper gastrointestinal endoscopy     Family History  Problem Relation Age of Onset  . Heart disease Mother   . Osteoarthritis Mother   . Sudden death Father   . Single kidney Father   . Other Father     h/o severe MVA injuries  . Hyperlipidemia Sister   . Other Daughter     Myalgias  . Fibromyalgia Daughter   . Allergies Daughter   . Heart disease Maternal Grandfather   . Sudden death  Paternal Grandmother   . Diabetes Paternal Grandfather   . Heart disease Daughter   . Other Daughter     palpitations    History   Social History  . Marital Status: Widowed    Spouse Name: N/A    Number of Children: N/A  . Years of Education: N/A   Occupational History  . Not on file.   Social History Main Topics  . Smoking status: Never Smoker   . Smokeless tobacco: Never Used  . Alcohol Use: No  . Drug Use: No  . Sexually Active: No   Other Topics Concern  . Not on file   Social History Narrative  . No narrative on file    Current Outpatient Prescriptions on File Prior to Visit  Medication Sig Dispense Refill  . ACCU-CHEK AVIVA test strip CHECK BLOOD SUGAR TWICE A DAY AND AS NEEDED  100 each  2  . albuterol (PROVENTIL HFA;VENTOLIN HFA) 108 (90 BASE) MCG/ACT inhaler Inhale 2 puffs into the lungs every 6 (six) hours as needed for wheezing.  1 Inhaler  2  . aspirin EC 81 MG tablet Take 1 tablet (81 mg total) by mouth daily.  150 tablet  2  . clobetasol (TEMOVATE) 0.05 % cream Apply topically at bedtime as needed. Apply a small amount vaginally  at bedtime for next week and then use sparingly qhs for any itching  30 g  1  . losartan-hydrochlorothiazide (HYZAAR) 100-25 MG per tablet TAKE 1 TABLET BY MOUTH EVERY DAY  90 tablet  0  . metFORMIN (GLUCOPHAGE) 500 MG tablet Take 1 tablet (500 mg total) by mouth 2 (two) times daily with a meal.  180 tablet  3  . nitroGLYCERIN (NITROSTAT) 0.4 MG SL tablet Place 1 tablet (0.4 mg total) under the tongue every 5 (five) minutes as needed for chest pain.  90 tablet  12  . NON FORMULARY Accu-Chek Lancets misc- Check blood sugars bid and prn       . NON FORMULARY Accu-Check Aviva Kit       . NON FORMULARY BD Disp Needles 30G X 1/2" misc       . potassium chloride (K-DUR) 10 MEQ tablet Take 1 tablet (10 mEq total) by mouth daily.  30 tablet  2  . PREMARIN vaginal cream Apply topically Every Sunday.      . rosuvastatin (CRESTOR) 40 MG tablet  Take 1 tablet (40 mg total) by mouth daily.  30 tablet  3  . FLUoxetine (PROZAC) 20 MG tablet Take 1 tablet (20 mg total) by mouth daily.  30 tablet  3    Allergies  Allergen Reactions  . Codeine Phosphate     Review of Systems  Review of Systems  Constitutional: Negative for fever and malaise/fatigue.  HENT: Negative for congestion.   Eyes: Negative for pain and discharge.  Respiratory: Negative for shortness of breath.   Cardiovascular: Negative for chest pain, palpitations and leg swelling.  Gastrointestinal: Negative for nausea, abdominal pain and diarrhea.  Genitourinary: Negative for dysuria.  Musculoskeletal: Negative for falls.  Skin: Negative for rash.  Neurological: Negative for loss of consciousness and headaches.  Endo/Heme/Allergies: Negative for polydipsia.  Psychiatric/Behavioral: Positive for depression. Negative for suicidal ideas. The patient is not nervous/anxious and does not have insomnia.     Objective  BP 130/70  Pulse 66  Temp(Src) 98.6 F (37 C) (Temporal)  Ht 5\' 3"  (1.6 m)  Wt 142 lb (64.411 kg)  BMI 25.15 kg/m2  SpO2 97%  Physical Exam  Physical Exam  Constitutional: She is oriented to person, place, and time and well-developed, well-nourished, and in no distress. No distress.  HENT:  Head: Normocephalic and atraumatic.  Eyes: Conjunctivae are normal.  Neck: Neck supple. No thyromegaly present.  Cardiovascular: Normal rate, regular rhythm and normal heart sounds.   No murmur heard. Pulmonary/Chest: Effort normal and breath sounds normal. She has no wheezes.  Abdominal: She exhibits no distension and no mass.  Musculoskeletal: She exhibits no edema.  Lymphadenopathy:    She has no cervical adenopathy.  Neurological: She is alert and oriented to person, place, and time.  Skin: Skin is warm and dry. No rash noted. She is not diaphoretic.  Psychiatric: Memory, affect and judgment normal.    Lab Results  Component Value Date   TSH 3.04  07/11/2011   Lab Results  Component Value Date   WBC 6.6 07/11/2011   HGB 12.9 07/11/2011   HCT 36.9 07/11/2011   MCV 83.3 07/11/2011   PLT 210 07/11/2011   Lab Results  Component Value Date   CREATININE 1.2 07/11/2011   BUN 16 07/11/2011   NA 139 07/11/2011   K 3.4* 07/11/2011   CL 103 07/11/2011   CO2 26 07/11/2011   Lab Results  Component Value Date   ALT 18  07/11/2011   AST 25 07/11/2011   ALKPHOS 45 07/11/2011   BILITOT 0.5 07/11/2011   Lab Results  Component Value Date   CHOL 123 07/11/2011   Lab Results  Component Value Date   HDL 56.20 07/11/2011   Lab Results  Component Value Date   LDLCALC 31 07/11/2011   Lab Results  Component Value Date   TRIG 177.0* 07/11/2011   Lab Results  Component Value Date   CHOLHDL 2 07/11/2011     Assessment & Plan  DIABETES MELLITUS, TYPE II Well controlled recently no changes  HYPERTENSION Adequately controlled without Bystolic, will not restart today  Depression with anxiety Her elderly aunt died the day after she was here for her last appt.. With the Fluoxetine and Alprazolam she feels she is doing OK. Will refill today  Sinusitis acute Resolved with treatment within 5 days of last appt

## 2011-12-29 ENCOUNTER — Other Ambulatory Visit: Payer: Self-pay

## 2011-12-29 DIAGNOSIS — E119 Type 2 diabetes mellitus without complications: Secondary | ICD-10-CM

## 2011-12-29 MED ORDER — METFORMIN HCL 500 MG PO TABS: 500.0000 mg | ORAL_TABLET | Freq: Two times a day (BID) | ORAL | Status: DC

## 2011-12-30 ENCOUNTER — Other Ambulatory Visit: Payer: Self-pay

## 2011-12-30 DIAGNOSIS — E119 Type 2 diabetes mellitus without complications: Secondary | ICD-10-CM

## 2011-12-30 MED ORDER — METFORMIN HCL 500 MG PO TABS: 500.0000 mg | ORAL_TABLET | Freq: Two times a day (BID) | ORAL | Status: DC

## 2012-01-21 ENCOUNTER — Telehealth: Payer: Self-pay | Admitting: Family Medicine

## 2012-01-21 DIAGNOSIS — J40 Bronchitis, not specified as acute or chronic: Secondary | ICD-10-CM

## 2012-01-21 MED ORDER — METHYLPREDNISOLONE 4 MG PO KIT: PACK | ORAL | Status: AC

## 2012-01-21 NOTE — Telephone Encounter (Signed)
Patient with recurrent bronchitis, without fever, requesting refill on Medrol dosepak. Will refill once if no improvement then needs to come in for appt

## 2012-06-14 ENCOUNTER — Other Ambulatory Visit: Payer: Self-pay

## 2012-06-14 DIAGNOSIS — E119 Type 2 diabetes mellitus without complications: Secondary | ICD-10-CM

## 2012-06-14 MED ORDER — LOSARTAN POTASSIUM-HCTZ 100-25 MG PO TABS: 1.0000 | ORAL_TABLET | Freq: Every day | ORAL | Status: DC

## 2012-06-14 MED ORDER — METFORMIN HCL 500 MG PO TABS: 500.0000 mg | ORAL_TABLET | Freq: Two times a day (BID) | ORAL | Status: DC

## 2012-07-06 ENCOUNTER — Other Ambulatory Visit: Payer: Self-pay

## 2012-07-06 DIAGNOSIS — F419 Anxiety disorder, unspecified: Secondary | ICD-10-CM

## 2012-07-06 DIAGNOSIS — F32A Depression, unspecified: Secondary | ICD-10-CM

## 2012-07-06 DIAGNOSIS — F329 Major depressive disorder, single episode, unspecified: Secondary | ICD-10-CM

## 2012-07-06 MED ORDER — ALPRAZOLAM 0.25 MG PO TABS: 0.2500 mg | ORAL_TABLET | Freq: Three times a day (TID) | ORAL | Status: AC | PRN

## 2012-07-06 NOTE — Telephone Encounter (Signed)
Please advise Alprazolam refill? Last RX wrote on 12-23-11 quantity 30 with 2 refills.  If ok to fill send to [phone]

## 2012-07-07 NOTE — Telephone Encounter (Signed)
Faxed to pharmacy

## 2012-10-10 ENCOUNTER — Other Ambulatory Visit: Payer: Self-pay | Admitting: Family Medicine

## 2012-11-07 ENCOUNTER — Other Ambulatory Visit: Payer: Self-pay | Admitting: Family Medicine

## 2012-11-08 LAB — HM DIABETES EYE EXAM

## 2012-11-09 NOTE — Telephone Encounter (Signed)
Opened in error

## 2012-12-17 ENCOUNTER — Other Ambulatory Visit (INDEPENDENT_AMBULATORY_CARE_PROVIDER_SITE_OTHER): Payer: Medicare Other

## 2012-12-17 DIAGNOSIS — Z0000 Encounter for general adult medical examination without abnormal findings: Secondary | ICD-10-CM

## 2012-12-17 DIAGNOSIS — I10 Essential (primary) hypertension: Secondary | ICD-10-CM

## 2012-12-17 DIAGNOSIS — E119 Type 2 diabetes mellitus without complications: Secondary | ICD-10-CM

## 2012-12-17 LAB — CBC
HCT: 37.5 % (ref 36.0–46.0)
MCV: 85 fl (ref 78.0–100.0)
RDW: 13.7 % (ref 11.5–14.6)

## 2012-12-17 NOTE — Progress Notes (Signed)
Labs only

## 2012-12-20 LAB — LIPID PANEL
Cholesterol: 119 mg/dL (ref 0–200)
Total CHOL/HDL Ratio: 3
Triglycerides: 250 mg/dL — ABNORMAL HIGH (ref 0.0–149.0)
VLDL: 50 mg/dL — ABNORMAL HIGH (ref 0.0–40.0)

## 2012-12-20 LAB — HEPATIC FUNCTION PANEL
Albumin: 4.2 g/dL (ref 3.5–5.2)
Total Bilirubin: 0.7 mg/dL (ref 0.3–1.2)

## 2012-12-20 LAB — RENAL FUNCTION PANEL
CO2: 26 mEq/L (ref 19–32)
Calcium: 9.1 mg/dL (ref 8.4–10.5)
Chloride: 103 mEq/L (ref 96–112)
GFR: 42.21 mL/min — ABNORMAL LOW (ref >60.00)
Potassium: 3.3 mEq/L — ABNORMAL LOW (ref 3.5–5.1)
Sodium: 138 mEq/L (ref 135–145)

## 2012-12-20 LAB — LDL CHOLESTEROL, DIRECT: Direct LDL: 48.1 mg/dL

## 2012-12-24 ENCOUNTER — Ambulatory Visit (INDEPENDENT_AMBULATORY_CARE_PROVIDER_SITE_OTHER): Payer: Medicare Other | Admitting: Family Medicine

## 2012-12-24 ENCOUNTER — Encounter: Payer: Self-pay | Admitting: Family Medicine

## 2012-12-24 VITALS — BP 148/84 | HR 85 | Temp 99.2°F | Ht 63.0 in | Wt 150.0 lb

## 2012-12-24 DIAGNOSIS — E119 Type 2 diabetes mellitus without complications: Secondary | ICD-10-CM

## 2012-12-24 DIAGNOSIS — F341 Dysthymic disorder: Secondary | ICD-10-CM

## 2012-12-24 DIAGNOSIS — J45909 Unspecified asthma, uncomplicated: Secondary | ICD-10-CM

## 2012-12-24 DIAGNOSIS — F418 Other specified anxiety disorders: Secondary | ICD-10-CM

## 2012-12-24 DIAGNOSIS — F329 Major depressive disorder, single episode, unspecified: Secondary | ICD-10-CM

## 2012-12-24 DIAGNOSIS — F419 Anxiety disorder, unspecified: Secondary | ICD-10-CM

## 2012-12-24 DIAGNOSIS — I10 Essential (primary) hypertension: Secondary | ICD-10-CM

## 2012-12-24 DIAGNOSIS — Z780 Asymptomatic menopausal state: Secondary | ICD-10-CM

## 2012-12-24 DIAGNOSIS — R079 Chest pain, unspecified: Secondary | ICD-10-CM

## 2012-12-24 DIAGNOSIS — F32A Depression, unspecified: Secondary | ICD-10-CM

## 2012-12-24 DIAGNOSIS — K219 Gastro-esophageal reflux disease without esophagitis: Secondary | ICD-10-CM

## 2012-12-24 DIAGNOSIS — F3289 Other specified depressive episodes: Secondary | ICD-10-CM

## 2012-12-24 DIAGNOSIS — T7840XA Allergy, unspecified, initial encounter: Secondary | ICD-10-CM

## 2012-12-24 DIAGNOSIS — R0789 Other chest pain: Secondary | ICD-10-CM

## 2012-12-24 DIAGNOSIS — F411 Generalized anxiety disorder: Secondary | ICD-10-CM

## 2012-12-24 DIAGNOSIS — E785 Hyperlipidemia, unspecified: Secondary | ICD-10-CM

## 2012-12-24 LAB — RENAL FUNCTION PANEL
Albumin: 4.5 g/dL (ref 3.5–5.2)
BUN: 17 mg/dL (ref 6–23)
Chloride: 100 mEq/L (ref 96–112)
Glucose, Bld: 85 mg/dL (ref 70–99)
Phosphorus: 3.3 mg/dL (ref 2.3–4.6)

## 2012-12-24 MED ORDER — ASPIRIN 81 MG PO TABS: 81.0000 mg | ORAL_TABLET | Freq: Every day | ORAL | Status: DC

## 2012-12-24 MED ORDER — RANITIDINE HCL 300 MG PO TABS: 300.0000 mg | ORAL_TABLET | Freq: Every evening | ORAL | Status: DC | PRN

## 2012-12-24 MED ORDER — NITROGLYCERIN 0.4 MG SL SUBL: 0.4000 mg | SUBLINGUAL_TABLET | SUBLINGUAL | Status: DC | PRN

## 2012-12-24 MED ORDER — FLUOXETINE HCL 20 MG PO TABS: 20.0000 mg | ORAL_TABLET | Freq: Every day | ORAL | Status: DC

## 2012-12-24 NOTE — Patient Instructions (Addendum)

## 2012-12-25 ENCOUNTER — Other Ambulatory Visit: Payer: Self-pay | Admitting: Family Medicine

## 2012-12-25 DIAGNOSIS — R079 Chest pain, unspecified: Secondary | ICD-10-CM

## 2012-12-25 NOTE — Progress Notes (Signed)
Patient ID: Elizabeth Mcintyre, female   DOB: 1941-03-03, 72 y.o.   MRN: [id number] Elizabeth Mcintyre [id number] [birth year] 12/25/2012      Progress Note-Follow Up  Subjective  Chief Complaint  Chief Complaint  Patient presents with  . Annual Exam    physical    HPI  Patient is a 72 year old Caucasian female who is in today complaining of multiple concerns. She's been noting some of increasing headaches and intermittent episodes of feeling lightheaded. She's had no syncope or falls. She has none at present time and no true vertigo. Chest with some sinus congestion but no fevers or chills. She reports her depression is relatively well controlled on Prozac. She does report some concerns  About 2 episodes of chest discomfort recently. She reports times it was not pain and lasted minutes to she rested. She was doing some heavy lifting right for these episodes occurred. There was a sense of shortness of breath as well but that has been occurring much more frequently recently without chest pain most of the time. No palpitations, diaphoresis or nausea associated. She has seen cardiology in the past but it's been greater than a year and she reports she does notably she's had a stress test for at least the last 5 years. She has had some recent muscle cramps and has been taking some over-the-counter potassium recently although not at the present time. He number diet with pulmonary fibrosis and historically has had numerous family members including 2 maternal aunts and a maternal grandfather have the diagnosis of pulmonary fibrosis in the past.  Past Medical History  Diagnosis Date  . Diabetes mellitus     type 2  . Hyperlipidemia   . Hypertension   . Asthma   . NECK AND BACK PAIN 11/26/2010  . HYPERTENSION 11/09/2007  . HYPERLIPIDEMIA 11/09/2007  . HIP PAIN, RIGHT 11/26/2010  . HEMATURIA UNSPECIFIED 11/26/2010  . Vaginitis 01/01/2011  . ASTHMA 11/09/2007  . ALLERGIC RHINITIS CAUSE UNSPECIFIED 11/26/2010  .  DIABETES MELLITUS, TYPE II 11/09/2007  . 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 Lymphadenopathy 04/03/2011  . Diverticulosis 03/2011  . Fatigue 07/22/2011  . Atypical chest pain 07/22/2011  . Sinusitis acute 12/05/2011  . Visual disturbance of one eye 12/05/2011    Past Surgical History  Procedure Laterality Date  . Abdominal hysterectomy    . Cardiac catheterization      X 2, last one in 1998  . Colonoscopy    . Upper gastrointestinal endoscopy      Family History  Problem Relation Age of Onset  . Heart disease Mother   . Osteoarthritis Mother   . Sudden death Father   . Single kidney Father   . Other Father     h/o severe MVA injuries  . Hyperlipidemia Sister   . Other Daughter     Myalgias  . Fibromyalgia Daughter   . Allergies Daughter   . Heart disease Maternal Grandfather   . Sudden death Paternal Grandmother   . Diabetes Paternal Grandfather   . Heart disease Daughter   . Other Daughter     palpitations  . Pulmonary fibrosis Maternal Aunt   . Cancer Paternal Uncle   . Pulmonary fibrosis Maternal Aunt     History   Social History  . Marital Status: Widowed    Spouse Name: N/A    Number of Children: N/A  . Years of Education: N/A   Occupational History  . Not on file.   Social History Main Topics  .  Smoking status: Never Smoker   . Smokeless tobacco: Never Used  . Alcohol Use: No  . Drug Use: No  . Sexually Active: No   Other Topics Concern  . Not on file   Social History Narrative  . No narrative on file    Current Outpatient Prescriptions on File Prior to Visit  Medication Sig Dispense Refill  . ACCU-CHEK AVIVA test strip CHECK BLOOD SUGAR TWICE A DAY AND AS NEEDED  100 each  2  . ALPRAZolam (XANAX) 0.25 MG tablet Take 1 tablet (0.25 mg total) by mouth 3 (three) times daily as needed for anxiety.  30 tablet  2  . CRESTOR 40 MG tablet TAKE 1 TABLET BY MOUTH EVERY DAY  30 tablet  3  . losartan-hydrochlorothiazide (HYZAAR) 100-25 MG per tablet Take  1 tablet by mouth daily. Patient will need appointment to get additional refills  90 tablet  0  . metFORMIN (GLUCOPHAGE) 500 MG tablet Take 1 tablet (500 mg total) by mouth 2 (two) times daily with a meal.  180 tablet  0  . NON FORMULARY Accu-Chek Lancets misc- Check blood sugars bid and prn       . NON FORMULARY Accu-Check Aviva Kit       . NON FORMULARY BD Disp Needles 30G X 1/2" misc       . PREMARIN vaginal cream Apply topically Every Sunday.      . potassium chloride (K-DUR) 10 MEQ tablet Take 1 tablet (10 mEq total) by mouth daily.  30 tablet  2   No current facility-administered medications on file prior to visit.    Allergies  Allergen Reactions  . Codeine Phosphate     Review of Systems  Review of Systems  Constitutional: Positive for malaise/fatigue. Negative for fever.  HENT: Negative for congestion.   Eyes: Negative for discharge.  Respiratory: Positive for shortness of breath. Negative for cough, sputum production and wheezing.   Cardiovascular: Positive for chest pain. Negative for palpitations and leg swelling.  Gastrointestinal: Negative for nausea, abdominal pain and diarrhea.  Genitourinary: Negative for dysuria.  Musculoskeletal: Negative for falls.  Skin: Negative for rash.  Neurological: Negative for loss of consciousness and headaches.  Endo/Heme/Allergies: Negative for polydipsia.  Psychiatric/Behavioral: Negative for depression and suicidal ideas. The patient is not nervous/anxious and does not have insomnia.     Objective  BP 148/84  Pulse 85  Temp(Src) 99.2 F (37.3 C) (Temporal)  Ht 5\' 3"  (1.6 m)  Wt 150 lb (68.04 kg)  BMI 26.58 kg/m2  SpO2 99%  Physical Exam  Physical Exam  Constitutional: She is oriented to person, place, and time and well-developed, well-nourished, and in no distress. No distress.  HENT:  Head: Normocephalic and atraumatic.  Eyes: Conjunctivae are normal.  Neck: Neck supple. No thyromegaly present.  Cardiovascular:  Normal rate, regular rhythm and normal heart sounds.   Pulmonary/Chest: Effort normal and breath sounds normal. She has no wheezes.  Abdominal: She exhibits no distension and no mass.  Musculoskeletal: She exhibits no edema.  Lymphadenopathy:    She has no cervical adenopathy.  Neurological: She is alert and oriented to person, place, and time.  Skin: Skin is warm and dry. No rash noted. She is not diaphoretic.  Psychiatric: Memory, affect and judgment normal.    Lab Results  Component Value Date   TSH 4.15 12/17/2012   Lab Results  Component Value Date   WBC 5.6 12/17/2012   HGB 12.7 12/17/2012   HCT 37.5 12/17/2012  MCV 85.0 12/17/2012   PLT 168.0 12/17/2012   Lab Results  Component Value Date   CREATININE 1.1 12/24/2012   BUN 17 12/24/2012   NA 133* 12/24/2012   K 3.9 12/24/2012   CL 100 12/24/2012   CO2 26 12/24/2012   Lab Results  Component Value Date   ALT 20 12/17/2012   AST 30 12/17/2012   ALKPHOS 46 12/17/2012   BILITOT 0.7 12/17/2012   Lab Results  Component Value Date   CHOL 119 12/17/2012   Lab Results  Component Value Date   HDL 44.40 12/17/2012   Lab Results  Component Value Date   LDLCALC 31 07/11/2011   Lab Results  Component Value Date   TRIG 250.0* 12/17/2012   Lab Results  Component Value Date   CHOLHDL 3 12/17/2012     Assessment & Plan  HYPERTENSION Improved some with repeat check. Continue current meds and encouraged DASH diet.  DIABETES MELLITUS, TYPE II hgba1c trending up. Encouraged decreased carbohydrate intake and continue current meds.  HYPERLIPIDEMIA Well controlled on crestor except for elevated TRs encouraged her to start Krill or fish oil caps. Avoid trans fats and minimize simple carbs, saturated fats  Atypical chest pain Reports 2 episodes of substernal chest discomfort she describes as mild and without associated symptoms except for some mild SOB which has also been occuring over  Past month and actually been more frequent than the pain. She  reports both episodes occurred when she had been doing some heavy lifting. She is not having any discomfort today and has not had any in the past few days. Due to her many risk factors and her new symptoms she is given a refill on her NTG, continue current meds and referred back to cardiology for further consideration. She will seek immediate care if her symptoms recur and she does not get relief with NTG  ASTHMA No recent flares but she is noting some increased SOB. Will consider pulmonary cause once cardiac concerns ruled out.  Depression with anxiety Well controlled on Prozac continue the same refills given

## 2012-12-25 NOTE — Assessment & Plan Note (Signed)
hgba1c trending up. Encouraged decreased carbohydrate intake and continue current meds.

## 2012-12-25 NOTE — Assessment & Plan Note (Signed)
No recent flares but she is noting some increased SOB. Will consider pulmonary cause once cardiac concerns ruled out.

## 2012-12-25 NOTE — Assessment & Plan Note (Signed)
Well controlled on crestor except for elevated TRs encouraged her to start Krill or fish oil caps. Avoid trans fats and minimize simple carbs, saturated fats

## 2012-12-25 NOTE — Assessment & Plan Note (Signed)
Well controlled on Prozac continue the same refills given

## 2012-12-25 NOTE — Assessment & Plan Note (Signed)
Improved some with repeat check. Continue current meds and encouraged DASH diet.

## 2012-12-25 NOTE — Assessment & Plan Note (Signed)
Reports 2 episodes of substernal chest discomfort she describes as mild and without associated symptoms except for some mild SOB which has also been occuring over  Past month and actually been more frequent than the pain. She reports both episodes occurred when she had been doing some heavy lifting. She is not having any discomfort today and has not had any in the past few days. Due to her many risk factors and her new symptoms she is given a refill on her NTG, continue current meds and referred back to cardiology for further consideration. She will seek immediate care if her symptoms recur and she does not get relief with NTG

## 2012-12-27 ENCOUNTER — Telehealth: Payer: Self-pay

## 2012-12-27 LAB — ~~LOC~~ ALLERGY PANEL
Allergen, D pternoyssinus,d7: 0.66 kU/L — ABNORMAL HIGH
Allergen, Mulberry, t76: <0.1 kU/l
Aspergillus fumigatus, m3: <0.1 kU/L
Bahia Grass: <0.1 kU/L
Bermuda Grass: 0.39 kU/L — ABNORMAL HIGH
Cladosporium Herbarum: <0.1 kU/L
Common Ragweed: <0.1 kU/L
D. farinae: 0.59 kU/L — ABNORMAL HIGH
Dog Dander: <0.1 kU/L
Elm IgE: <0.1 kU/L
Mucor Racemosus: <0.1 kU/L
Oak: <0.1 kU/L
Plantain: <0.1 kU/L
Stemphylium Botryosum: <0.1 kU/L
Sweet Gum: <0.1 kU/L

## 2012-12-27 MED ORDER — GLUCOSE BLOOD VI STRP: ORAL_STRIP | Status: DC

## 2012-12-27 NOTE — Progress Notes (Signed)
Quick Note:  Patient Informed and voiced understanding ______ 

## 2012-12-27 NOTE — Telephone Encounter (Signed)
RX sent for test strips

## 2012-12-30 ENCOUNTER — Other Ambulatory Visit: Payer: Self-pay | Admitting: Family Medicine

## 2012-12-30 NOTE — Telephone Encounter (Signed)
Rx request to pharmacy/[facility]  

## 2013-01-12 ENCOUNTER — Ambulatory Visit (INDEPENDENT_AMBULATORY_CARE_PROVIDER_SITE_OTHER): Payer: Medicare Other | Admitting: Cardiology

## 2013-01-12 ENCOUNTER — Telehealth: Payer: Self-pay | Admitting: Family Medicine

## 2013-01-12 ENCOUNTER — Encounter: Payer: Self-pay | Admitting: Cardiology

## 2013-01-12 VITALS — BP 178/91 | HR 89 | Ht 63.0 in | Wt 148.0 lb

## 2013-01-12 DIAGNOSIS — E119 Type 2 diabetes mellitus without complications: Secondary | ICD-10-CM

## 2013-01-12 DIAGNOSIS — R0789 Other chest pain: Secondary | ICD-10-CM

## 2013-01-12 DIAGNOSIS — R0602 Shortness of breath: Secondary | ICD-10-CM

## 2013-01-12 DIAGNOSIS — I10 Essential (primary) hypertension: Secondary | ICD-10-CM

## 2013-01-12 MED ORDER — METFORMIN HCL 500 MG PO TABS: 500.0000 mg | ORAL_TABLET | Freq: Two times a day (BID) | ORAL | Status: DC

## 2013-01-12 NOTE — Telephone Encounter (Signed)
Refill- metformin hcl 500mg  tablet. Take one tablet by mouth twice a day with a meal. Qty 180 last fill 10.3.13

## 2013-01-12 NOTE — Patient Instructions (Addendum)
The current medical regimen is effective;  continue present plan and medications. May take Melatonin or Benadryl for sleep.   Please have blood work at your PCP office. (BNP)  Follow up in 6 months with Dr Antoine Poche.  You will receive a letter in the mail 2 months before you are due.  Please call us when you receive this letter to schedule your follow up appointment.

## 2013-01-12 NOTE — Progress Notes (Signed)
HPI The patient presents for evaluation of hypertension chest discomfort. She was being followed by Dr. Elease Hashimoto.  She has a history of chest discomfort. She has had a previous work up to include a stress perfusion study which was negative in 2011. She has had problems with somewhat difficult to control hypertension.  She presents because of an abnormal EKG with possible Q waves in the inferior leads. However, I compare an EKG previously to 1 earlier this month and it is unchanged. She denies any new complaints but she continues to have dyspnea with moderate exertion. She might have to stop walking because she gets short of breath. However, this doesn't seem to be reproducible. She's not describing PND or orthopnea. She's not describing chest pressure that he gets an occasional discomfort in her chest it seems to be a stable pattern and not reproducible. She's had workup for this as described above in the past. Her biggest complaint seems to be fatigued. On further questioning she does not sleep at night and has had a very significant and chronic problem with insomnia.  Allergies  Allergen Reactions  . Codeine Phosphate     Current Outpatient Prescriptions  Medication Sig Dispense Refill  . ALPRAZolam (XANAX) 0.25 MG tablet Take 1 tablet (0.25 mg total) by mouth 3 (three) times daily as needed for anxiety.  30 tablet  2  . aspirin 81 MG tablet Take 1 tablet (81 mg total) by mouth daily.  30 tablet    . CRESTOR 40 MG tablet TAKE 1 TABLET BY MOUTH EVERY DAY  30 tablet  3  . FLUoxetine (PROZAC) 20 MG tablet Take 1 tablet (20 mg total) by mouth daily.  30 tablet  12  . glucose blood (FREESTYLE LITE) test strip Use as instructed  100 each  1  . losartan-hydrochlorothiazide (HYZAAR) 100-25 MG per tablet TAKE 1 TABLET BY MOUTH EVERY DAY  90 tablet  0  . metFORMIN (GLUCOPHAGE) 500 MG tablet Take 1 tablet (500 mg total) by mouth 2 (two) times daily with a meal.  180 tablet  0  . nitroGLYCERIN (NITROSTAT)  0.4 MG SL tablet Place 1 tablet (0.4 mg total) under the tongue every 5 (five) minutes as needed for chest pain.  25 tablet  1  . NON FORMULARY Accu-Chek Lancets misc- Check blood sugars bid and prn       . NON FORMULARY Accu-Check Aviva Kit       . NON FORMULARY BD Disp Needles 30G X 1/2" misc       . ranitidine (ZANTAC) 300 MG tablet Take 1 tablet (300 mg total) by mouth at bedtime as needed for heartburn.  30 tablet  5   No current facility-administered medications for this visit.    Past Medical History  Diagnosis Date  . Diabetes mellitus     type 2  . Hyperlipidemia   . Hypertension   . Asthma   . NECK AND BACK PAIN 11/26/2010  . HYPERTENSION 11/09/2007  . HYPERLIPIDEMIA 11/09/2007  . HIP PAIN, RIGHT 11/26/2010  . HEMATURIA UNSPECIFIED 11/26/2010  . Vaginitis 01/01/2011  . ASTHMA 11/09/2007  . ALLERGIC RHINITIS CAUSE UNSPECIFIED 11/26/2010  . DIABETES MELLITUS, TYPE II 11/09/2007  . 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 Lymphadenopathy 04/03/2011  . Diverticulosis 03/2011  . Fatigue 07/22/2011  . Atypical chest pain 07/22/2011  . Sinusitis acute 12/05/2011  . Visual disturbance of one eye 12/05/2011    Past Surgical History  Procedure Laterality Date  . Abdominal hysterectomy    .  Cardiac catheterization      X 2, last one in 1998  . Colonoscopy    . Upper gastrointestinal endoscopy      ROS:  As stated in the HPI and negative for all other systems.  PHYSICAL EXAM BP 178/91  Pulse 89  Ht 5\' 3"  (1.6 m)  Wt 148 lb (67.132 kg)  BMI 26.22 kg/m2 GENERAL:  Well appearing HEENT:  Pupils equal round and reactive, fundi not visualized, oral mucosa unremarkable NECK:  No jugular venous distention, waveform within normal limits, carotid upstroke brisk and symmetric, no bruits, no thyromegaly LYMPHATICS:  No cervical, inguinal adenopathy LUNGS:  Clear to auscultation bilaterally BACK:  No CVA tenderness CHEST:  Unremarkable HEART:  PMI not displaced or sustained,S1 and S2 within  normal limits, no S3, no S4, no clicks, no rubs, no murmurs ABD:  Flat, positive bowel sounds normal in frequency in pitch, no bruits, no rebound, no guarding, no midline pulsatile mass, no hepatomegaly, no splenomegaly EXT:  2 plus pulses throughout, no edema, no cyanosis no clubbing SKIN:  No rashes no nodules NEURO:  Cranial nerves II through XII grossly intact, motor grossly intact throughout PSYCH:  Cognitively intact, oriented to person place and time  EKG:  Sinus rhythm, rate 68, leftward axis, poor anterior R wave progression, RSR prime V1, no acute ST-T wave changes. No change from previous. 01/12/2013  ASSESSMENT AND PLAN  DYSPNEA:  I don't suspect strongly a cardiac etiology but I will check a BNP level. I did review the last stress test as well as echocardiography from a few years ago and there was no suggestion of a cardiac etiology.  HTN:  Her blood pressure is mildly elevated here but it isn't always and she says it's well controlled at home. She will continue the meds as listed.  CHEST PAIN:  Her chest pain is atypical. With her risk factors I think a plain a treadmill would be reasonable but she would like to defer this as she doesn't think her breathing will allow.  FATIGUE:  I have suggested sleep consultation but she would like to avoid this. Secondarily I have suggested she consider melatonin or Benadryl. She also needs to talk this over with her primary provider as I think sleep is a major issue.

## 2013-01-12 NOTE — Telephone Encounter (Signed)
Rx sent in to pharmacy. 

## 2013-01-18 ENCOUNTER — Other Ambulatory Visit (INDEPENDENT_AMBULATORY_CARE_PROVIDER_SITE_OTHER): Payer: Medicare Other

## 2013-01-18 DIAGNOSIS — R0602 Shortness of breath: Secondary | ICD-10-CM

## 2013-01-18 LAB — BRAIN NATRIURETIC PEPTIDE: Pro B Natriuretic peptide (BNP): 43 pg/mL (ref 0.0–100.0)

## 2013-01-18 NOTE — Progress Notes (Signed)
Labs only

## 2013-05-11 IMAGING — CR DG PELVIS 1-2V
1 series · 1 of 1 positions shown · non-contrast
Comparison: None.

CLINICAL DATA: Chronic right SI joint pain

EXAM:
PELVIS - 1-2 VIEW

[view not recorded]
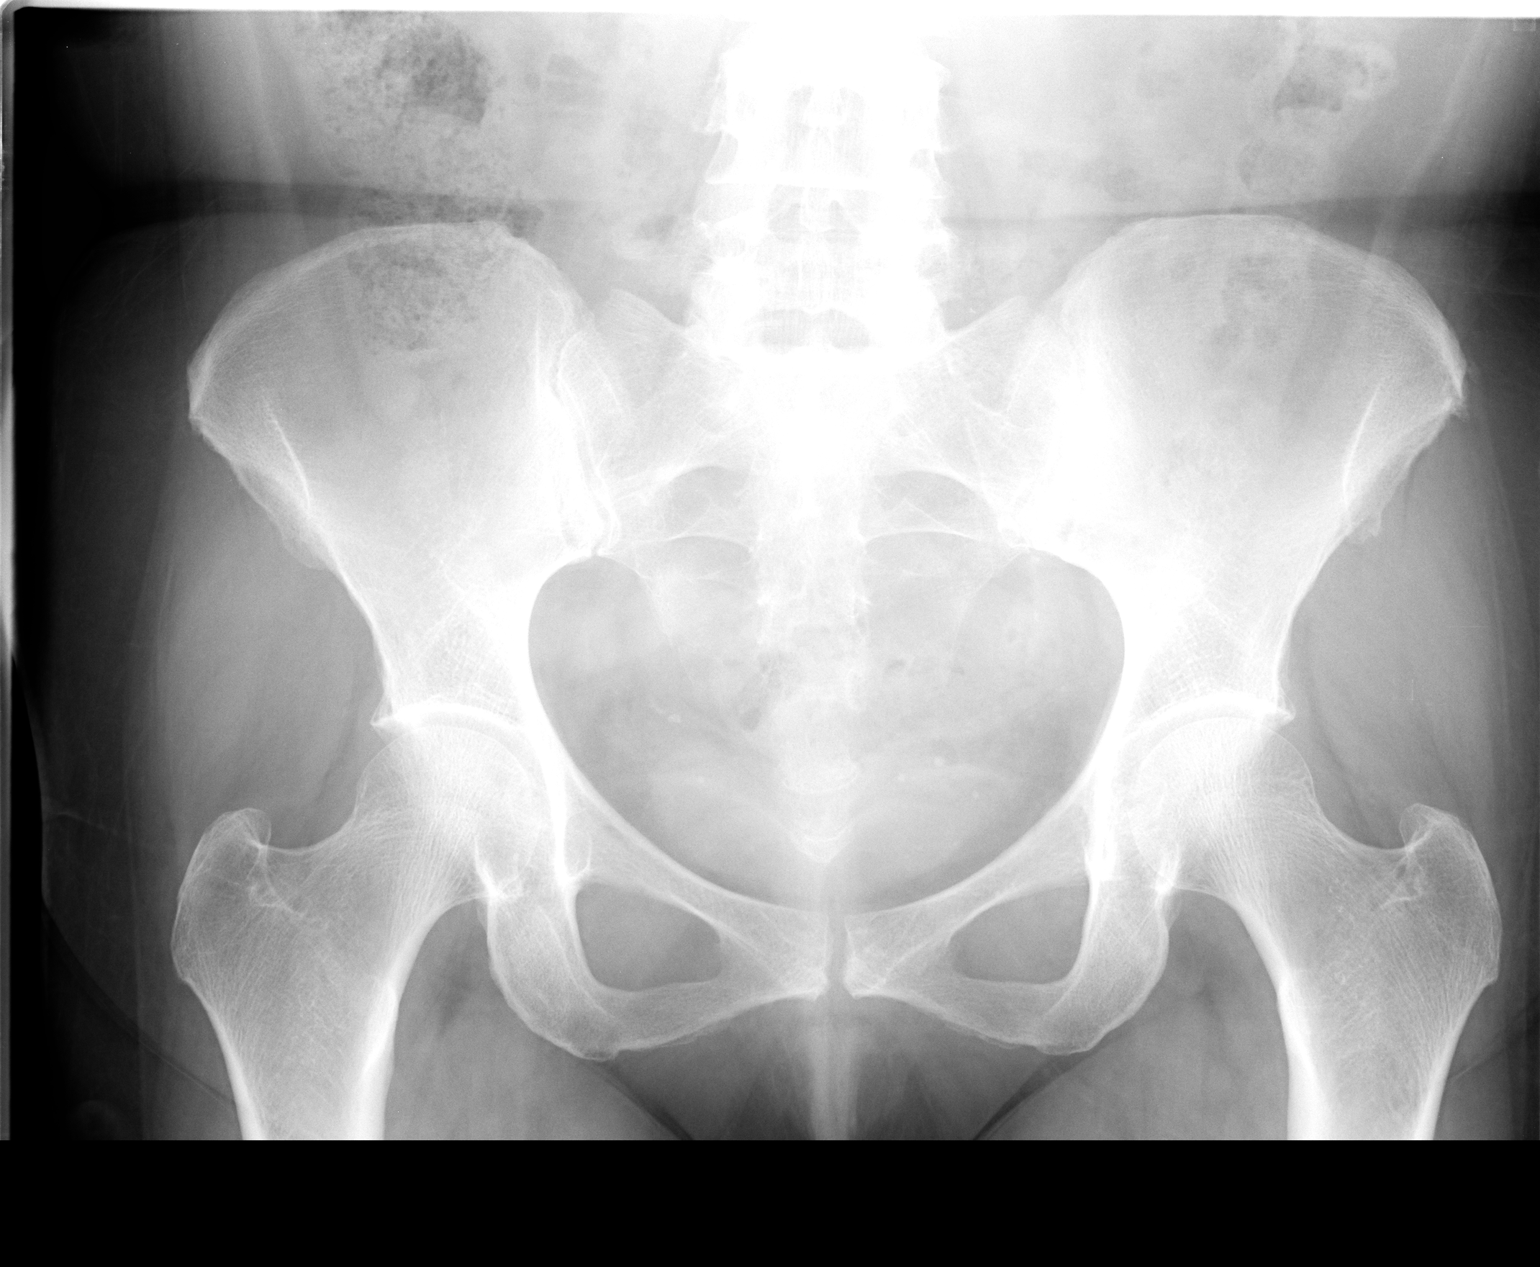

[1 of 1 positions shown; findings below may reference images not displayed]

FINDINGS: Both hip joints are normal. SI joints are normal. No fracture or
mass. Normal pelvis.
IMPRESSION: Negative.

## 2013-07-26 ENCOUNTER — Other Ambulatory Visit: Payer: Self-pay | Admitting: Family Medicine

## 2013-08-04 ENCOUNTER — Telehealth: Payer: Self-pay | Admitting: *Deleted

## 2013-08-04 NOTE — Telephone Encounter (Signed)
I have attempted twice to contact patient on behalf of [facility] GAP closure project). Unfortunately, I have been unsuccessful in reaching her. Patient is overdue for mammogram.

## 2013-08-16 NOTE — Telephone Encounter (Signed)
I have made a 3rd attempt to reach patient to advise that she is overdue for mammogram. I have still been unsuccessful at getting in touch with her.

## 2013-08-29 ENCOUNTER — Encounter: Payer: Self-pay | Admitting: Nurse Practitioner

## 2013-08-29 ENCOUNTER — Other Ambulatory Visit: Payer: Self-pay | Admitting: *Deleted

## 2013-08-29 ENCOUNTER — Telehealth: Payer: Self-pay | Admitting: Nurse Practitioner

## 2013-08-29 ENCOUNTER — Ambulatory Visit (INDEPENDENT_AMBULATORY_CARE_PROVIDER_SITE_OTHER): Payer: Medicare Other | Admitting: Nurse Practitioner

## 2013-08-29 VITALS — BP 134/80 | HR 93 | Temp 98.2°F | Ht 62.5 in | Wt 153.0 lb

## 2013-08-29 DIAGNOSIS — F32A Depression, unspecified: Secondary | ICD-10-CM

## 2013-08-29 DIAGNOSIS — E119 Type 2 diabetes mellitus without complications: Secondary | ICD-10-CM

## 2013-08-29 DIAGNOSIS — F411 Generalized anxiety disorder: Secondary | ICD-10-CM

## 2013-08-29 DIAGNOSIS — Z1321 Encounter for screening for nutritional disorder: Secondary | ICD-10-CM

## 2013-08-29 DIAGNOSIS — F329 Major depressive disorder, single episode, unspecified: Secondary | ICD-10-CM

## 2013-08-29 DIAGNOSIS — N952 Postmenopausal atrophic vaginitis: Secondary | ICD-10-CM

## 2013-08-29 DIAGNOSIS — F3289 Other specified depressive episodes: Secondary | ICD-10-CM

## 2013-08-29 DIAGNOSIS — Z130 Encounter for screening for diseases of the blood and blood-forming organs and certain disorders involving the immune mechanism: Secondary | ICD-10-CM

## 2013-08-29 DIAGNOSIS — Z1239 Encounter for other screening for malignant neoplasm of breast: Secondary | ICD-10-CM

## 2013-08-29 MED ORDER — ESTROGENS, CONJUGATED 0.625 MG/GM VA CREA: 1.0000 | TOPICAL_CREAM | Freq: Every day | VAGINAL | Status: DC

## 2013-08-29 MED ORDER — FLUOXETINE HCL 20 MG PO TABS: ORAL_TABLET | ORAL | Status: DC

## 2013-08-29 NOTE — Progress Notes (Signed)
Pre-visit discussion using our clinic review tool. No additional management support is needed unless otherwise documented below in the visit note.  

## 2013-08-29 NOTE — Telephone Encounter (Signed)
Patient would like to keep her care at the [facility] office since it is closer to her home.

## 2013-08-29 NOTE — Patient Instructions (Signed)
Our office will call with lab results. I want to see you again in 6 weeks to see how prozac is working. Let us know if you need to see [place] sooner. Please consider behavioral changes to help with sleep. Walk for 30 minutes daily to help with diabetes, back pain, and sleep. Exchange fibrous foods for low fiber foods. Great to meet you!  Diabetes and Exercise Exercising regularly is important. It is not just about losing weight. It has many health benefits, such as:  Improving your overall fitness, flexibility, and endurance.  Increasing your bone density.  Helping with weight control.  Decreasing your body fat.  Increasing your muscle strength.  Reducing stress and tension.  Improving your overall health. People with diabetes who exercise gain additional benefits because exercise:  Reduces appetite.  Improves the body's use of blood sugar (glucose).  Helps lower or control blood glucose.  Decreases blood pressure.  Helps control blood lipids (such as cholesterol and triglycerides).  Improves the body's use of the hormone insulin by:  Increasing the body's insulin sensitivity.  Reducing the body's insulin needs.  Decreases the risk for heart disease because exercising:  Lowers cholesterol and triglycerides levels.  Increases the levels of good cholesterol (such as high-density lipoproteins [HDL]) in the body.  Lowers blood glucose levels. YOUR ACTIVITY PLAN  Choose an activity that you enjoy and set realistic goals. Your health care provider or diabetes educator can help you make an activity plan that works for you. You can break activities into 2 or 3 sessions throughout the day. Doing so is as good as one long session. Exercise ideas include:  Taking the dog for a walk.  Taking the stairs instead of the elevator.  Dancing to your favorite song.  Doing your favorite exercise with a friend. RECOMMENDATIONS FOR EXERCISING WITH TYPE 1 OR TYPE 2 DIABETES   Check your  blood glucose before exercising. If blood glucose levels are greater than 240 mg/dL, check for urine ketones. Do not exercise if ketones are present.  Avoid injecting insulin into areas of the body that are going to be exercised. For example, avoid injecting insulin into:  The arms when playing tennis.  The legs when jogging.  Keep a record of:  Food intake before and after you exercise.  Expected peak times of insulin action.  Blood glucose levels before and after you exercise.  The type and amount of exercise you have done.  Review your records with your health care provider. Your health care provider will help you to develop guidelines for adjusting food intake and insulin amounts before and after exercising.  If you take insulin or oral hypoglycemic agents, watch for signs and symptoms of hypoglycemia. They include:  Dizziness.  Shaking.  Sweating.  Chills.  Confusion.  Drink plenty of water while you exercise to prevent dehydration or heat stroke. Body water is lost during exercise and must be replaced.  Talk to your health care provider before starting an exercise program to make sure it is safe for you. Remember, almost any type of activity is better than none. Document Released: 11/22/2003 Document Revised: 05/04/2013 Document Reviewed: 02/08/2013 [facility] Patient Information 2014 [place], [place].

## 2013-08-29 NOTE — Telephone Encounter (Signed)
OK with me.

## 2013-08-30 ENCOUNTER — Encounter: Payer: Self-pay | Admitting: Nurse Practitioner

## 2013-08-30 NOTE — Progress Notes (Signed)
Subjective:     Elizabeth Mcintyre is a 72 y.o. female and is here for a comprehensive physical exam. She is currently treated for HTN, DM 2, dyslipidemia, and reflux. She has a history of depression for which she took prozac. She stopped the medication many months ago as she reports "didn't feel like needed it". Recently, personal circumstances are causing stress with trouble sleeping. She would like to resume prozac. In addition, she continues to c/o low R-sided back pain that radiates into R hip, buttock, & leg. Pain wakes her at night. Pain does not bother her during the day when she is moving around.  History   Social History  . Marital Status: Widowed    Spouse Name: N/A    Number of Children: 2  . Years of Education: N/A   Occupational History  . retired    Social History Main Topics  . Smoking status: Never Smoker   . Smokeless tobacco: Never Used  . Alcohol Use: No  . Drug Use: No  . Sexual Activity: No   Other Topics Concern  . Not on file   Social History Narrative   Elizabeth Mcintyre is widowed. Her young grandson lives with her, for whom she shares custody with her daughter.     Health Maintenance  Topic Date Due  . Zostavax  05/01/2002  . Pneumococcal Polysaccharide Vaccine Age 39 And Over  05/02/2007  . Mammogram  04/09/2013  . Influenza Vaccine  08/29/2014  . Colonoscopy  01/29/2021  . Tetanus/tdap  07/15/2021    The following portions of the patient's history were reviewed and updated as appropriate: allergies, current medications, past medical history, past social history and problem list.  Review of Systems Constitutional: negative for anorexia, chills, fatigue, fevers, night sweats and weight loss Eyes: no recent eye exam Ears, nose, mouth, throat, and face: negative for nasal congestion and sore throat Respiratory: negative for cough, dyspnea on exertion, sputum and wheezing Cardiovascular: negative for chest pain, chest pressure/discomfort, irregular heart  beat, lower extremity edema and near-syncope Gastrointestinal: positive for R suprapubic pain, negative for abdominal pain, change in bowel habits, constipation, diarrhea and dyspepsia Genitourinary:reports nocturia, stress incontinence Integument/breast: negative for rash Musculoskeletal:positive for back pain Behavioral/Psych: positive for anxiety and sleep disturbance, negative for bad mood, decreased appetite, excessive alcohol consumption, illegal drug usage, increased appetite and tobacco use Endocrine: negative for diabetic symptoms including polydipsia, polyphagia and polyuria and temperature intolerance   Objective:    BP 134/80  Pulse 93  Temp(Src) 98.2 F (36.8 C) (Oral)  Ht 5' 2.5" (1.588 m)  Wt 153 lb (69.4 kg)  BMI 27.52 kg/m2  SpO2 97% General appearance: alert, cooperative, appears stated age and no distress Head: Normocephalic, without obvious abnormality, atraumatic Eyes: negative findings: lids and lashes normal, conjunctivae and sclerae normal, corneas clear and pupils equal, round, reactive to light and accomodation Ears: normal TM's and external ear canals both ears Throat: lips, mucosa, and tongue normal; teeth and gums normal Back: mild kyphosis, thoracic tenderness over spine, mild tenderness over R sacro-illiac joint. Lungs: clear to auscultation bilaterally Heart: regular rate and rhythm, S1, S2 normal, no murmur, click, rub or gallop Abdomen: soft, non-tender; bowel sounds normal; no masses,  no organomegaly Extremities: extremities normal, atraumatic, no cyanosis or edema Pulses: 2+ and symmetric Lymph nodes: Cervical, supraclavicular, and axillary nodes normal. Neurologic: Alert and oriented X 3, normal strength and tone. Normal symmetric reflexes. Normal coordination and gait    Assessment:    1  prev care needs MMG, labs, declined flu 2 HTN good control on losartan/ HCTZ, labs as ordered 3 DM, last HgbA1C 6.6 from 6.0. Continues on metformin 4  anxiety, r/t personal circumstances 5 back & hip pain, chronic, worse at night, wakes from sleep. Hx rheum arthritis.     Plan:    1 monitor labs. Next CPE 1 yr 2 Continue current meds, enc exercise 3 monitor labs, may need to adj meds. Discussed increasing fiber, daily exercise. Declined nutrition class. 4 start prozac. F/u in 6 wks. D/c xanax. Will add ativan once back on fluoxetine for 3 weeks.  5 xrays next visit. rec natural anti-inflamms. Daily walk. See pt instructions.

## 2013-08-31 ENCOUNTER — Encounter: Payer: Self-pay | Admitting: *Deleted

## 2013-09-01 ENCOUNTER — Other Ambulatory Visit: Payer: Self-pay | Admitting: Nurse Practitioner

## 2013-09-01 LAB — CBC
HCT: 40 % (ref 36.0–46.0)
MCH: 28.6 pg (ref 26.0–34.0)
MCHC: 34.8 g/dL (ref 30.0–36.0)
MCV: 82.3 fL (ref 78.0–100.0)
Platelets: 198 10*3/uL (ref 150–400)
RDW: 15.1 % (ref 11.5–15.5)
WBC: 7.9 10*3/uL (ref 4.0–10.5)

## 2013-09-01 LAB — HEMOGLOBIN A1C: Hgb A1c MFr Bld: 6.8 % — ABNORMAL HIGH (ref <5.7)

## 2013-09-01 LAB — COMPLETE METABOLIC PANEL WITH GFR
Albumin: 4.7 g/dL (ref 3.5–5.2)
BUN: 30 mg/dL — ABNORMAL HIGH (ref 6–23)
CO2: 25 mEq/L (ref 19–32)
Calcium: 9.6 mg/dL (ref 8.4–10.5)
Chloride: 99 mEq/L (ref 96–112)
GFR, Est African American: 48 mL/min — ABNORMAL LOW
GFR, Est Non African American: 41 mL/min — ABNORMAL LOW
Glucose, Bld: 148 mg/dL — ABNORMAL HIGH (ref 70–99)
Potassium: 3.9 mEq/L (ref 3.5–5.3)
Sodium: 139 mEq/L (ref 135–145)
Total Bilirubin: 0.4 mg/dL (ref 0.3–1.2)
Total Protein: 7.4 g/dL (ref 6.0–8.3)

## 2013-09-01 LAB — LIPID PANEL
HDL: 45 mg/dL (ref >39)
LDL Cholesterol: 27 mg/dL (ref 0–99)
Total CHOL/HDL Ratio: 3 Ratio
Triglycerides: 309 mg/dL — ABNORMAL HIGH (ref <150)

## 2013-09-01 LAB — VITAMIN B12: Vitamin B-12: 570 pg/mL (ref 211–911)

## 2013-09-02 ENCOUNTER — Telehealth: Payer: Self-pay | Admitting: Nurse Practitioner

## 2013-09-02 LAB — MICROALBUMIN / CREATININE URINE RATIO
Creatinine, Urine: 96.8 mg/dL
Microalb Creat Ratio: 28.1 mg/g (ref 0.0–30.0)
Microalb, Ur: 2.72 mg/dL — ABNORMAL HIGH (ref 0.00–1.89)

## 2013-09-02 LAB — URINALYSIS, MICROSCOPIC ONLY: Squamous Epithelial / LPF: NONE SEEN

## 2013-09-02 LAB — VITAMIN D 25 HYDROXY (VIT D DEFICIENCY, FRACTURES): Vit D, 25-Hydroxy: 26 ng/mL — ABNORMAL LOW (ref 30–89)

## 2013-09-02 NOTE — Telephone Encounter (Signed)
Decline in kidney func & hgBA1C. Triglycerides remain high. Vit D low. Recmd pt start Vit D3 5000 iu daily, B complex w.b12 daily to get at least 1000 mcg b12 daily,. Pt decreased metformin to once daily-will increase to bid Stressed cut out white sugar & walk 30 mins daily. Diab f/u in 3 mos.

## 2013-09-28 ENCOUNTER — Other Ambulatory Visit: Payer: Self-pay | Admitting: Family Medicine

## 2013-09-28 DIAGNOSIS — E2839 Other primary ovarian failure: Secondary | ICD-10-CM

## 2013-09-29 ENCOUNTER — Ambulatory Visit
Admission: RE | Admit: 2013-09-29 | Discharge: 2013-09-29 | Disposition: A | Discharge status: Home / Self Care | Payer: Medicare Other | Source: Ambulatory Visit | Attending: Nurse Practitioner | Admitting: Nurse Practitioner

## 2013-09-29 ENCOUNTER — Ambulatory Visit
Admission: RE | Admit: 2013-09-29 | Discharge: 2013-09-29 | Disposition: A | Discharge status: Home / Self Care | Payer: Medicare Other | Source: Ambulatory Visit | Attending: Family Medicine | Admitting: Family Medicine

## 2013-09-29 DIAGNOSIS — E2839 Other primary ovarian failure: Secondary | ICD-10-CM

## 2013-09-29 DIAGNOSIS — Z1239 Encounter for other screening for malignant neoplasm of breast: Secondary | ICD-10-CM

## 2013-09-30 ENCOUNTER — Encounter: Payer: Self-pay | Admitting: *Deleted

## 2013-10-13 ENCOUNTER — Other Ambulatory Visit: Payer: Self-pay | Admitting: *Deleted

## 2013-10-13 DIAGNOSIS — E119 Type 2 diabetes mellitus without complications: Secondary | ICD-10-CM

## 2013-10-14 MED ORDER — METFORMIN HCL 500 MG PO TABS: 500.0000 mg | ORAL_TABLET | Freq: Two times a day (BID) | ORAL | Status: DC

## 2013-11-16 ENCOUNTER — Other Ambulatory Visit: Payer: Self-pay

## 2013-11-16 DIAGNOSIS — F32A Depression, unspecified: Secondary | ICD-10-CM

## 2013-11-16 DIAGNOSIS — F329 Major depressive disorder, single episode, unspecified: Secondary | ICD-10-CM

## 2013-11-16 MED ORDER — FLUOXETINE HCL 20 MG PO TABS: ORAL_TABLET | ORAL | Status: DC

## 2013-11-18 ENCOUNTER — Telehealth: Payer: Self-pay | Admitting: Nurse Practitioner

## 2013-11-18 NOTE — Telephone Encounter (Signed)
Started pt on prozac, she has not follwed up. Needs appt. Also will discuss DEXA results & f/u chronic conditions.

## 2013-11-21 NOTE — Telephone Encounter (Signed)
Pt notified and appt scheduled for 11/29/2013 at 2:00 pm.

## 2013-11-29 ENCOUNTER — Encounter: Payer: Self-pay | Admitting: Nurse Practitioner

## 2013-11-29 ENCOUNTER — Ambulatory Visit (INDEPENDENT_AMBULATORY_CARE_PROVIDER_SITE_OTHER): Payer: Medicare Other | Admitting: Nurse Practitioner

## 2013-11-29 VITALS — BP 153/86 | HR 80 | Temp 98.3°F | Resp 18 | Ht 62.5 in | Wt 149.0 lb

## 2013-11-29 DIAGNOSIS — M8580 Other specified disorders of bone density and structure, unspecified site: Secondary | ICD-10-CM

## 2013-11-29 DIAGNOSIS — E119 Type 2 diabetes mellitus without complications: Secondary | ICD-10-CM

## 2013-11-29 DIAGNOSIS — F329 Major depressive disorder, single episode, unspecified: Secondary | ICD-10-CM

## 2013-11-29 DIAGNOSIS — M25559 Pain in unspecified hip: Secondary | ICD-10-CM

## 2013-11-29 DIAGNOSIS — F418 Other specified anxiety disorders: Secondary | ICD-10-CM

## 2013-11-29 DIAGNOSIS — M899 Disorder of bone, unspecified: Secondary | ICD-10-CM

## 2013-11-29 DIAGNOSIS — F3289 Other specified depressive episodes: Secondary | ICD-10-CM

## 2013-11-29 DIAGNOSIS — F32A Depression, unspecified: Secondary | ICD-10-CM

## 2013-11-29 DIAGNOSIS — M949 Disorder of cartilage, unspecified: Secondary | ICD-10-CM

## 2013-11-29 DIAGNOSIS — F341 Dysthymic disorder: Secondary | ICD-10-CM

## 2013-11-29 MED ORDER — FLUOXETINE HCL 20 MG PO TABS: ORAL_TABLET | ORAL | Status: DC

## 2013-11-29 MED ORDER — LORAZEPAM 1 MG PO TABS: ORAL_TABLET | ORAL | Status: DC

## 2013-11-29 NOTE — Patient Instructions (Signed)
Let me know if nervousness gets worse. Continue to walk 30 minutes daily. Continue Vitamin D & calcium. I will see you in 2 mos. Consider natural anti-inflammatories for arthritis pain: Fresh ginger tea daily (grate 1-2 TBLS fresh ginger in & steep in hot water for 5 minutes); handful tart cherries or 1/2 cup tart cherry juice daily; 2-3 curmarin capsules daily. Start 1 supplement at a time & take for 2 weeks before adding a second or third, if needed.  Osteoarthritis Osteoarthritis is a disease that causes soreness and swelling (inflammation) of a joint. It occurs when the cartilage at the affected joint wears down. Cartilage acts as a cushion, covering the ends of bones where they meet to form a joint. Osteoarthritis is the most common form of arthritis. It often occurs in older people. The joints affected most often by this condition include those in the:  Ends of the fingers.  Thumbs.  Neck.  Lower back.  Knees.  Hips. CAUSES  Over time, the cartilage that covers the ends of bones begins to wear away. This causes bone to rub on bone, producing pain and stiffness in the affected joints.  RISK FACTORS Certain factors can increase your chances of having osteoarthritis, including:  Older age.  Excessive body weight.  Overuse of joints. SIGNS AND SYMPTOMS   Pain, swelling, and stiffness in the joint.  Over time, the joint may lose its normal shape.  Small deposits of bone (osteophytes) may grow on the edges of the joint.  Bits of bone or cartilage can break off and float inside the joint space. This may cause more pain and damage. DIAGNOSIS  Your health care provider will do a physical exam and ask about your symptoms. Various tests may be ordered, such as:  X-rays of the affected joint.  An MRI scan.  Blood tests to rule out other types of arthritis.  Joint fluid tests. This involves using a needle to draw fluid from the joint and examining the fluid under a  microscope. TREATMENT  Goals of treatment are to control pain and improve joint function. Treatment plans may include:  A prescribed exercise program that allows for rest and joint relief.  A weight control plan.  Pain relief techniques, such as:  Properly applied heat and cold.  Electric pulses delivered to nerve endings under the skin (transcutaneous electrical nerve stimulation, TENS).  Massage.  Certain nutritional supplements.  Medicines to control pain, such as:  Acetaminophen.  Nonsteroidal anti-inflammatory drugs (NSAIDs), such as naproxen.  Narcotic or central-acting agents, such as tramadol.  Corticosteroids. These can be given orally or as an injection.  Surgery to reposition the bones and relieve pain (osteotomy) or to remove loose pieces of bone and cartilage. Joint replacement may be needed in advanced states of osteoarthritis. HOME CARE INSTRUCTIONS   Only take over-the-counter or prescription medicines as directed by your health care provider. Take all medicines exactly as instructed.  Maintain a healthy weight. Follow your health care provider's instructions for weight control. This may include dietary instructions.  Exercise as directed. Your health care provider can recommend specific types of exercise. These may include:  Strengthening exercises These are done to strengthen the muscles that support joints affected by arthritis. They can be performed with weights or with exercise bands to add resistance.  Aerobic activities These are exercises, such as brisk walking or low-impact aerobics, that get your heart pumping.  Range-of-motion activities These keep your joints limber.  Balance and agility exercises These help  you maintain daily living skills.  Rest your affected joints as directed by your health care provider.  Follow up with your health care provider as directed. SEEK MEDICAL CARE IF:   Your skin turns red.  You develop a rash in  addition to your joint pain.  You have worsening joint pain. SEEK IMMEDIATE MEDICAL CARE IF:  You have a significant loss of weight or appetite.  You have a fever along with joint or muscle aches.  You have night sweats. [place] of Arthritis and Musculoskeletal and Skin Diseases: www.niams.SouthExposed.es [employer] on Aging: http://kim-miller.com/ American College of Rheumatology: www.rheumatology.org Document Released: 09/01/2005 Document Revised: 06/22/2013 Document Reviewed: 05/09/2013 [facility] Patient Information 2014 [place], [place].

## 2013-11-29 NOTE — Progress Notes (Signed)
Pre visit review using our clinic review tool, if applicable. No additional management support is needed unless otherwise documented below in the visit note. 

## 2013-11-30 NOTE — Assessment & Plan Note (Signed)
Pt reports lack of motivation, feeling stressed with family events. Denies SI. Started prozac few mos ago at 20 mg qd. Pt not sure if med is helping, she thinks she may have some less anxiety. She is not using PRN xanax. She reports feeling jittery in am for a few hours, then goes away-may be med SE or r/t diabetes, although she has checked blood sugar when feeling this way & states usually around 130. Will increase to 30 mg qd. D/c xanax due to associations w/dementia. Prescribed PRN ativan instead. F/u 8 wks.

## 2013-11-30 NOTE — Progress Notes (Signed)
Subjective:     Elizabeth Mcintyre is a 73 y.o. female. She presents for f/u of depression/anxiety. Prozac started few mos ago at 20 mg qd. Pt did not keep 6 wk f/u appt. She thinks med is helping, but does not sure. She rarely uses xanax. Continues to have lack of motivation and feels stressed with family dynamics. Planning to have guests for [employer] states this adds to stress. Not sleeping well- has no bedtime routine falling asleep at different places in house & while watching TV. States may fall asleep at 4 am & sleep til 9 am. Feels tired, but no trouble with focus or concentration. She continues to c/o R hip & back pain w/radiation into R leg. Pain is better when exercising regularly, worse w/prolonged sitting. Seems to be worse at night,  when lying on back. Discussed recent bone density. She has osteopenia in both spine & femur. She reports feeling jittery in ams around 11 am. Lasts for few hours, then goes away. She has checked BS when feeling this way-sugars usually around 130.   The following portions of the patient's history were reviewed and updated as appropriate: allergies, current medications, past family history, past medical history, past social history, past surgical history and problem list.  Review of Systems Pertinent items are noted in HPI.    Objective:    BP 153/86  Pulse 80  Temp(Src) 98.3 F (36.8 C) (Oral)  Resp 18  Ht 5' 2.5" (1.588 m)  Wt 149 lb (67.586 kg)  BMI 26.80 kg/m2  SpO2 98% BP 153/86  Pulse 80  Temp(Src) 98.3 F (36.8 C) (Oral)  Resp 18  Ht 5' 2.5" (1.588 m)  Wt 149 lb (67.586 kg)  BMI 26.80 kg/m2  SpO2 98% General appearance: alert, cooperative, appears stated age and no distress Head: Normocephalic, without obvious abnormality, atraumatic Eyes: negative findings: lids and lashes normal and conjunctivae and sclerae normal Back: no tenderness to percussion or palpation Lungs: clear to auscultation bilaterally Heart: regular rate  and rhythm, S1, S2 normal, no murmur, click, rub or gallop Extremities: extremities normal, atraumatic, no cyanosis or edema Pulses: 2+ and symmetric    Assessment:     1. Depression   2. Depression with anxiety   3. DIABETES MELLITUS, TYPE II   4. HIP PAIN, RIGHT   5  Osteopenia T scores: L Hip -2.2, lumbar -1.7       Plan:     See prob list for complete A&P F/u in 8 wks for increase in prozac, DM, HTN mngmt

## 2013-11-30 NOTE — Assessment & Plan Note (Addendum)
Last A1C 6.8 from 6.6. Pt reports FBS around 130. Taking metformin 500 mg qd. BUN & creat elevated. High triglycerides. GFR in 40's. Encouraged 30  Minutes exercise daily. Cut out sugar. Taking statin. A1c next visit.

## 2013-11-30 NOTE — Assessment & Plan Note (Signed)
Continues to complain of R hip pain that wakes at night. Associated with R sided lumbar pain that radiates into leg. Pain better when exercises, worse w/prolonged sitting. Pt does not wish to use celebrex. Taking 400 mg ibuprophen daily with relief. Recmd natural anti-inflamms. Refuses xrays.

## 2013-12-02 ENCOUNTER — Telehealth: Payer: Self-pay | Admitting: *Deleted

## 2013-12-02 DIAGNOSIS — M8580 Other specified disorders of bone density and structure, unspecified site: Secondary | ICD-10-CM | POA: Insufficient documentation

## 2013-12-02 NOTE — Assessment & Plan Note (Signed)
ENcouraged to continue w/vit D3 supplement. Goal is to keep b/w 50-80. Calcium intake 1200 mg/daily: at least 600 in foods. Limit caffiene, ETOH, dark soda. Weight bearing exercise daily-walk with wrist, ankle weights, may buy vest for added weight. Not interested in PT for osteo prevention.   Plan another scan in 2 years if pt interested in taking meds.

## 2013-12-02 NOTE — Telephone Encounter (Signed)
Message copied by Julieta Bellini on Fri Dec 02, 2013  2:28 PM ------      Message from: WEAVER, [place] [facility]      Created: Fri Dec 02, 2013  9:54 AM       pls call pt:      She needs f/u appt. In 2 mos.. I want to review diabetes, HTN & get labs , & discuss increase in prozac. ------

## 2013-12-02 NOTE — Telephone Encounter (Signed)
LMOVM for patient to return call. Pt needs to schedule f/u appointment in 2 months.

## 2013-12-08 ENCOUNTER — Telehealth: Payer: Self-pay

## 2013-12-08 NOTE — Telephone Encounter (Signed)
Relevant patient education assigned to patient using Emmi. ° °

## 2014-02-27 ENCOUNTER — Other Ambulatory Visit: Payer: Self-pay | Admitting: *Deleted

## 2014-02-27 MED ORDER — ROSUVASTATIN CALCIUM 40 MG PO TABS: ORAL_TABLET | ORAL | Status: DC

## 2014-03-29 ENCOUNTER — Other Ambulatory Visit: Payer: Self-pay | Admitting: Family Medicine

## 2014-04-04 ENCOUNTER — Other Ambulatory Visit: Payer: Self-pay | Admitting: Nurse Practitioner

## 2014-04-04 NOTE — Telephone Encounter (Signed)
Pt will need OV for refills.

## 2014-04-04 NOTE — Telephone Encounter (Addendum)
Please advise refills. Rx's have not been filled by you previously.

## 2014-04-05 ENCOUNTER — Telehealth: Payer: Self-pay | Admitting: *Deleted

## 2014-04-05 MED ORDER — LOSARTAN POTASSIUM-HCTZ 100-25 MG PO TABS: ORAL_TABLET | ORAL | Status: DC

## 2014-04-05 MED ORDER — GLUCOSE BLOOD VI STRP
ORAL_STRIP | Status: DC
Start: 2014-04-05 — End: 2014-08-07

## 2014-04-05 NOTE — Telephone Encounter (Signed)
Please checkto see if I filled it yesterday. If not, send as refill.

## 2014-04-05 NOTE — Telephone Encounter (Signed)
Patient was informed about needing appt. Patient scheduled ov appt for 04/07/14. Patient wants to know if you would send in rx's before her appt on Friday? Please advise?

## 2014-04-07 ENCOUNTER — Ambulatory Visit (INDEPENDENT_AMBULATORY_CARE_PROVIDER_SITE_OTHER): Payer: Medicare Other | Admitting: Nurse Practitioner

## 2014-04-07 ENCOUNTER — Other Ambulatory Visit: Payer: Self-pay

## 2014-04-07 ENCOUNTER — Encounter: Payer: Self-pay | Admitting: Nurse Practitioner

## 2014-04-07 VITALS — BP 130/76 | HR 80 | Temp 98.3°F | Resp 18 | Ht 62.5 in | Wt 157.0 lb

## 2014-04-07 DIAGNOSIS — J309 Allergic rhinitis, unspecified: Secondary | ICD-10-CM

## 2014-04-07 DIAGNOSIS — Z79899 Other long term (current) drug therapy: Secondary | ICD-10-CM

## 2014-04-07 DIAGNOSIS — I10 Essential (primary) hypertension: Secondary | ICD-10-CM

## 2014-04-07 DIAGNOSIS — E119 Type 2 diabetes mellitus without complications: Secondary | ICD-10-CM

## 2014-04-07 DIAGNOSIS — E785 Hyperlipidemia, unspecified: Secondary | ICD-10-CM

## 2014-04-07 DIAGNOSIS — F418 Other specified anxiety disorders: Secondary | ICD-10-CM

## 2014-04-07 DIAGNOSIS — F341 Dysthymic disorder: Secondary | ICD-10-CM

## 2014-04-07 LAB — LIPID PANEL
CHOL/HDL RATIO: 4
Cholesterol: 179 mg/dL (ref 0–200)
HDL: 42.2 mg/dL (ref >39.00)
NonHDL: 136.8
Triglycerides: 471 mg/dL — ABNORMAL HIGH (ref 0.0–149.0)
VLDL: 94.2 mg/dL — ABNORMAL HIGH (ref 0.0–40.0)

## 2014-04-07 LAB — URINALYSIS, ROUTINE W REFLEX MICROSCOPIC
Bilirubin Urine: NEGATIVE
Hgb urine dipstick: NEGATIVE
KETONES UR: NEGATIVE
Leukocytes, UA: NEGATIVE
Nitrite: NEGATIVE
SPECIFIC GRAVITY, URINE: 1.01 (ref 1.000–1.030)
TOTAL PROTEIN, URINE-UPE24: NEGATIVE
UROBILINOGEN UA: 0.2 (ref 0.0–1.0)
Urine Glucose: NEGATIVE
pH: 7 (ref 5.0–8.0)

## 2014-04-07 LAB — CBC
HCT: 40.9 % (ref 36.0–46.0)
Hemoglobin: 13.8 g/dL (ref 12.0–15.0)
MCHC: 33.7 g/dL (ref 30.0–36.0)
MCV: 85.8 fl (ref 78.0–100.0)
PLATELETS: 204 10*3/uL (ref 150.0–400.0)
RBC: 4.77 Mil/uL (ref 3.87–5.11)
RDW: 14.3 % (ref 11.5–15.5)
WBC: 8 10*3/uL (ref 4.0–10.5)

## 2014-04-07 LAB — COMPREHENSIVE METABOLIC PANEL
ALBUMIN: 4.3 g/dL (ref 3.5–5.2)
ALK PHOS: 59 U/L (ref 39–117)
ALT: 34 U/L (ref 0–35)
AST: 42 U/L — AB (ref 0–37)
BUN: 19 mg/dL (ref 6–23)
CALCIUM: 9.8 mg/dL (ref 8.4–10.5)
CO2: 27 mEq/L (ref 19–32)
Chloride: 99 mEq/L (ref 96–112)
Creatinine, Ser: 1.3 mg/dL — ABNORMAL HIGH (ref 0.4–1.2)
GFR: 41.69 mL/min — ABNORMAL LOW (ref >60.00)
Glucose, Bld: 146 mg/dL — ABNORMAL HIGH (ref 70–99)
Potassium: 3.3 mEq/L — ABNORMAL LOW (ref 3.5–5.1)
Sodium: 136 mEq/L (ref 135–145)
Total Bilirubin: 0.4 mg/dL (ref 0.2–1.2)
Total Protein: 7.6 g/dL (ref 6.0–8.3)

## 2014-04-07 LAB — HEMOGLOBIN A1C: Hgb A1c MFr Bld: 7.2 % — ABNORMAL HIGH (ref 4.6–6.5)

## 2014-04-07 LAB — MICROALBUMIN / CREATININE URINE RATIO
CREATININE, U: 140.9 mg/dL
MICROALB UR: 2.2 mg/dL — AB (ref 0.0–1.9)
Microalb Creat Ratio: 1.6 mg/g (ref 0.0–30.0)

## 2014-04-07 LAB — T4, FREE: FREE T4: 0.66 ng/dL (ref 0.60–1.60)

## 2014-04-07 LAB — LDL CHOLESTEROL, DIRECT: Direct LDL: 84 mg/dL

## 2014-04-07 LAB — VITAMIN D 25 HYDROXY (VIT D DEFICIENCY, FRACTURES): VITD: 33.64 ng/mL (ref 30.00–100.00)

## 2014-04-07 LAB — VITAMIN B12: Vitamin B-12: 571 pg/mL (ref 211–911)

## 2014-04-07 LAB — TSH: TSH: 5.53 u[IU]/mL — AB (ref 0.35–4.50)

## 2014-04-07 NOTE — Progress Notes (Signed)
Subjective:     Elizabeth Mcintyre is a 73 y.o. female presents for f/u of HTN, DM, depression, hyperlipidemia and c/o thick postnasal drip at night, watery eyes.  Re HTN: fair control-BP elevated initially on arrival, normalized after sitting. Last GFR in 40's. Due for eye exam. Taking meds. Increased fruits, veggies, & nuts.  Re DM: she is taking metformin-tinks it makes her feel bad, takes in am with cereal. Advised to take w/largest meal of day. Slight increase in A1C 4 mos ago. Positive diet changes. Walking. Re depression: "good days/bad days", not sleeping well. Ativan works as well as xanax for jitteriness. Did not increase prozac to 30 mg, remains at 20mg  qd-does not feel needs higher dose. Taking care of elderly aunt w/dementia in home.  Re hyperlipidemia: Continues on crestor qd. Tolerating without SE.  The following portions of the patient's history were reviewed and updated as appropriate: allergies, current medications, past medical history, past social history, past surgical history and problem list.  Review of Systems Pertinent items are noted in HPI.    Objective:    BP 130/76  Pulse 80  Temp(Src) 98.3 F (36.8 C) (Oral)  Resp 18  Ht 5' 2.5" (1.588 m)  Wt 157 lb (71.215 kg)  BMI 28.24 kg/m2  SpO2 95% BP 130/76  Pulse 80  Temp(Src) 98.3 F (36.8 C) (Oral)  Resp 18  Ht 5' 2.5" (1.588 m)  Wt 157 lb (71.215 kg)  BMI 28.24 kg/m2  SpO2 95% General appearance: alert, cooperative, appears stated age and no distress Head: Normocephalic, without obvious abnormality, atraumatic Eyes: negative findings: lids and lashes normal, conjunctivae and sclerae normal and corneas clear Ears: bilat effusions, RTM retraction, bones visible Throat: lips, mucosa, and tongue normal; teeth and gums normal Neck: no adenopathy, no carotid bruit, supple, symmetrical, trachea midline and thyroid not enlarged, symmetric, no tenderness/mass/nodules Lungs: clear to auscultation bilaterally Heart:  regular rate and rhythm, S1, S2 normal, no murmur, click, rub or gallop Abdomen: soft, non-tender; bowel sounds normal; no masses,  no organomegaly Extremities: extremities normal, atraumatic, no cyanosis or edema and mild erythema & tenderness of R great toe at leteral nail bed edge Pulses: 2+ and symmetric Skin: see diabetic foot exam Lymph nodes: Cervical, supraclavicular, and axillary nodes normal.    Assessment:     1. Type II or unspecified type diabetes mellitus without mention of complication, not stated as uncontrolled - Hemoglobin A1c - CBC - Comprehensive metabolic panel - Urinalysis, Routine w reflex microscopic - Vitamin B12 - Vit D  25 hydroxy (rtn osteoporosis monitoring) - Lipid panel - TSH - T4, free  2. HYPERTENSION - Microalbumin / creatinine urine ratio 3. ALLERGIC RHINITIS CAUSE UNSPECIFIED  4. Depression with anxiety  See problem list for complete A&P See pt instructions F/u 3 mos

## 2014-04-07 NOTE — Progress Notes (Signed)
Pre visit review using our clinic review tool, if applicable. No additional management support is needed unless otherwise documented below in the visit note. 

## 2014-04-07 NOTE — Assessment & Plan Note (Addendum)
Taking crestor qd. Tolerating without SE. Discussed exercise & diet changes. Lipids today.

## 2014-04-07 NOTE — Assessment & Plan Note (Signed)
Taking prozac 20 mg mg. C/o shooting sharp sensation in fingertips & feet. Jittery feeling in am better, but still occasionally occurs. Goes away with ativan. Not sleeping well. Continue meds. F/u 3 mos.

## 2014-04-07 NOTE — Assessment & Plan Note (Signed)
C/o thick postnasal drip at night. Mild bilat effusions. Sinus rinse daily, flonase.

## 2014-04-07 NOTE — Assessment & Plan Note (Signed)
A1C, CMET today. GFR 40's. Discussed best diet: no sugar or refined flours. Fruits, vegetables, whole grains, nuts, seeds. Daily walk. Continue metformin. See foot exam. Pt to schedule eye exam.

## 2014-04-07 NOTE — Assessment & Plan Note (Signed)
High initially on arrival. Nml after sitting for several minutes. GFR in 40's Labs today:CMET, urine microalb. Continue hyzaar 100/25.  Discussed diet changes to help with weight reduction. Encourage 30 minute walk daily.

## 2014-04-07 NOTE — Patient Instructions (Addendum)
Start taking metformin with biggest meal of day, otherwise no medication changes. I may make changes after I get lab results back. We will call you.  Breads & cereals should have at least 3 g fiber per serving to be considered whole grain. Continue to eat mostly fruits & vegetables, whole grains, beans & peas, brown rice, quinoa for best health.  Continue to walk daily-at least 30 minutes.    Soak foot several times daily for about 10 minutes for a few days in mixture of vinegar, hydrogen peroxide, listerene, comfortable hot water. Let me know if redness does not clear.  Nice to see you!

## 2014-04-10 ENCOUNTER — Telehealth: Payer: Self-pay | Admitting: Nurse Practitioner

## 2014-04-10 DIAGNOSIS — E039 Hypothyroidism, unspecified: Secondary | ICD-10-CM

## 2014-04-10 MED ORDER — LEVOTHYROXINE SODIUM 25 MCG PO TABS: 25.0000 ug | ORAL_TABLET | Freq: Every day | ORAL | Status: DC

## 2014-04-10 NOTE — Telephone Encounter (Signed)
pls call pt: Advise Thyroid is underactive. Start thyroid medicine today.  Vitamin D too low. Start D3 2000 iu daily. Pls schedule OV in 6 weeks. I will discuss other labs at that time.

## 2014-04-10 NOTE — Telephone Encounter (Signed)
Patient notified of results. Patient will cb to schedule 6 week f/u appt.

## 2014-05-29 ENCOUNTER — Encounter: Payer: Self-pay | Admitting: Nurse Practitioner

## 2014-05-29 ENCOUNTER — Ambulatory Visit (INDEPENDENT_AMBULATORY_CARE_PROVIDER_SITE_OTHER): Payer: Medicare Other | Admitting: Nurse Practitioner

## 2014-05-29 VITALS — BP 141/80 | HR 81 | Temp 98.1°F | Resp 18 | Ht 62.5 in | Wt 155.0 lb

## 2014-05-29 DIAGNOSIS — R799 Abnormal finding of blood chemistry, unspecified: Secondary | ICD-10-CM

## 2014-05-29 DIAGNOSIS — R220 Localized swelling, mass and lump, head: Secondary | ICD-10-CM

## 2014-05-29 DIAGNOSIS — E785 Hyperlipidemia, unspecified: Secondary | ICD-10-CM

## 2014-05-29 DIAGNOSIS — I10 Essential (primary) hypertension: Secondary | ICD-10-CM

## 2014-05-29 DIAGNOSIS — R740 Nonspecific elevation of levels of transaminase and lactic acid dehydrogenase [LDH]: Secondary | ICD-10-CM

## 2014-05-29 DIAGNOSIS — R7401 Elevation of levels of liver transaminase levels: Secondary | ICD-10-CM

## 2014-05-29 DIAGNOSIS — R221 Localized swelling, mass and lump, neck: Secondary | ICD-10-CM

## 2014-05-29 DIAGNOSIS — E039 Hypothyroidism, unspecified: Secondary | ICD-10-CM

## 2014-05-29 DIAGNOSIS — R7989 Other specified abnormal findings of blood chemistry: Secondary | ICD-10-CM

## 2014-05-29 DIAGNOSIS — E1129 Type 2 diabetes mellitus with other diabetic kidney complication: Secondary | ICD-10-CM

## 2014-05-29 DIAGNOSIS — R7402 Elevation of levels of lactic acid dehydrogenase (LDH): Secondary | ICD-10-CM

## 2014-05-29 LAB — COMPREHENSIVE METABOLIC PANEL
ALK PHOS: 50 U/L (ref 39–117)
ALT: 29 U/L (ref 0–35)
AST: 32 U/L (ref 0–37)
Albumin: 4.3 g/dL (ref 3.5–5.2)
BILIRUBIN TOTAL: 0.5 mg/dL (ref 0.2–1.2)
BUN: 27 mg/dL — ABNORMAL HIGH (ref 6–23)
CO2: 25 mEq/L (ref 19–32)
Calcium: 9.6 mg/dL (ref 8.4–10.5)
Chloride: 94 mEq/L — ABNORMAL LOW (ref 96–112)
Creatinine, Ser: 1.4 mg/dL — ABNORMAL HIGH (ref 0.4–1.2)
GFR: 39.6 mL/min — ABNORMAL LOW (ref >60.00)
Glucose, Bld: 101 mg/dL — ABNORMAL HIGH (ref 70–99)
POTASSIUM: 3.3 meq/L — AB (ref 3.5–5.1)
SODIUM: 130 meq/L — AB (ref 135–145)
TOTAL PROTEIN: 7.5 g/dL (ref 6.0–8.3)

## 2014-05-29 LAB — TSH: TSH: 2.2 u[IU]/mL (ref 0.35–4.50)

## 2014-05-29 MED ORDER — ROSUVASTATIN CALCIUM 40 MG PO TABS: ORAL_TABLET | ORAL | Status: DC

## 2014-05-29 MED ORDER — LOSARTAN POTASSIUM 100 MG PO TABS: 100.0000 mg | ORAL_TABLET | Freq: Every day | ORAL | Status: DC

## 2014-05-29 MED ORDER — METFORMIN HCL 500 MG PO TABS: ORAL_TABLET | ORAL | Status: DC

## 2014-05-29 NOTE — Patient Instructions (Signed)
For 6 weeks, make these changes to improve sugar metabolism and decrease triglycerides: Breakfast: fruit, berries, melons Lunch a plate of green food to include kale, spinach, leaf or [place] lettuce, cabbage, cucumbers, carrots, tomatoes, onions, peppers, avacado, radishes, broccoli, cauliflower. More fruit. Dinner: steamed vegetables to include peppers, broccoli, greens, spinach, bok choy, zucchini, squash, onions, garlic. More fruit. You may have 1 cup beans daily; 1 cup cereal or whole grain (has to have 4 grams fiber/serving) or 1 cup potatoes; and a handful of nuts/seeds daily. You could have 1/2 cup oatmeal in morning and 1/2 cup brown rice or potatoes at dinner/lunch.  Limit meat & eggs to 3 times/week.  No sweet drinks or foods.   I will check diabetes & cholesterol in 6 weeks. I think you will feel better & have more energy.  Start new blood pressure medicine.  Get ultra sound of neck.  Nice to see you!

## 2014-05-29 NOTE — Progress Notes (Signed)
Pre visit review using our clinic review tool, if applicable. No additional management support is needed unless otherwise documented below in the visit note. 

## 2014-05-30 ENCOUNTER — Telehealth: Payer: Self-pay | Admitting: Nurse Practitioner

## 2014-05-30 DIAGNOSIS — R7401 Elevation of levels of liver transaminase levels: Secondary | ICD-10-CM | POA: Insufficient documentation

## 2014-05-30 DIAGNOSIS — R740 Nonspecific elevation of levels of transaminase and lactic acid dehydrogenase [LDH]: Secondary | ICD-10-CM

## 2014-05-30 DIAGNOSIS — E871 Hypo-osmolality and hyponatremia: Secondary | ICD-10-CM

## 2014-05-30 DIAGNOSIS — R7989 Other specified abnormal findings of blood chemistry: Secondary | ICD-10-CM | POA: Insufficient documentation

## 2014-05-30 DIAGNOSIS — E039 Hypothyroidism, unspecified: Secondary | ICD-10-CM | POA: Insufficient documentation

## 2014-05-30 DIAGNOSIS — E118 Type 2 diabetes mellitus with unspecified complications: Secondary | ICD-10-CM

## 2014-05-30 DIAGNOSIS — E876 Hypokalemia: Secondary | ICD-10-CM

## 2014-05-30 DIAGNOSIS — E1165 Type 2 diabetes mellitus with hyperglycemia: Secondary | ICD-10-CM

## 2014-05-30 DIAGNOSIS — IMO0002 Reserved for concepts with insufficient information to code with codable children: Secondary | ICD-10-CM

## 2014-05-30 DIAGNOSIS — R221 Localized swelling, mass and lump, neck: Secondary | ICD-10-CM | POA: Insufficient documentation

## 2014-05-30 HISTORY — DX: Hypothyroidism, unspecified: E03.9

## 2014-05-30 MED ORDER — PIOGLITAZONE HCL 15 MG PO TABS: 15.0000 mg | ORAL_TABLET | Freq: Every day | ORAL | Status: DC

## 2014-05-30 NOTE — Progress Notes (Signed)
Subjective:     Elizabeth Mcintyre is a 73 y.o. female returns for f/u of abnormal labs and new diagnosis of hypothyroidism. She was started on 25 mcg levothyroxine about 8 weeks ago. She has not felt an improvement in energy. She reports feeling extremely tired. Re diabetes, she is taking metformin qd. A1C increased to 7.2 from 6.8. She made some diet changes since last visit: more beans, fresh fruit, and whole grains. She asks if she needs to give up ice cream. She does not check BS, but has the impression that 90 is extremely low. We discussed normal blood sugars. Labs reveal mildly low potassium-likely due to HCTZ. She denies LE edema & does not recall that it was ever a problem. I will D/C HCTZ today. Creat & BUN are stable, but if creat rises to 1.4, metformin will be contraindicated.  AST slightly elevated. Triglycerides continue to climb. Discussed cutting out all sugar & refined flours to decrease triglycerides. If she is not able to do this w/diet, I will start fenofibrate & check lipase & amylase at next visit. She c/o diarrhea for last 3 weeks & wakes with morning headache most days for 2 months. Associated symptoms are mild nausea & dizzyness.  The following portions of the patient's history were reviewed and updated as appropriate: allergies, current medications, past family history, past medical history, past social history, past surgical history and problem list.  Review of Systems Pertinent items are noted in HPI.    Objective:    BP 141/80  Pulse 81  Temp(Src) 98.1 F (36.7 C) (Oral)  Resp 18  Ht 5' 2.5" (1.588 m)  Wt 155 lb (70.308 kg)  BMI 27.88 kg/m2  SpO2 98% BP 141/80  Pulse 81  Temp(Src) 98.1 F (36.7 C) (Oral)  Resp 18  Ht 5' 2.5" (1.588 m)  Wt 155 lb (70.308 kg)  BMI 27.88 kg/m2  SpO2 98% General appearance: alert, cooperative, appears stated age, no distress and looks tired Head: Normocephalic, without obvious abnormality, atraumatic Eyes: negative  findings: lids and lashes normal and conjunctivae and sclerae normal Ears: normal TM's and external ear canals both ears Throat: lips, mucosa, and tongue normal; teeth and gums normal Neck: no adenopathy, no carotid bruit, supple, symmetrical, trachea midline, thyroid not enlarged, symmetric, no tenderness/mass/nodules and fullness of neck, L slightly more prominent than R Lungs: clear to auscultation bilaterally Heart: regular rate and rhythm, S1, S2 normal, no murmur, click, rub or gallop Extremities: extremities normal, atraumatic, no cyanosis or edema Pulses: 2+ and symmetric Lymph nodes: able to palpate tonsilar nodes, NT, not moveable.    Assessment:     1. 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  Dispense: 90 tablet; Refill: 0  2. Elevated serum creatinine - Comprehensive metabolic panel  3. Elevated AST (SGOT) - Comprehensive metabolic panel  4. Unspecified hypothyroidism - TSH  5. Nodule of neck - US Soft Tissue Head/Neck; Future  6. Other and unspecified hyperlipidemia - rosuvastatin (CRESTOR) 40 MG tablet; TAKE 1 TABLET BY MOUTH EVERY DAY  Dispense: 30 tablet; Refill: 3  7. Essential hypertension, benign - losartan (COZAAR) 100 MG tablet; Take 1 tablet (100 mg total) by mouth daily.  Dispense: 30 tablet; Refill: 2  See problem list for complete A&P See pt instructions. F/u 8 weeks diet changes

## 2014-05-30 NOTE — Assessment & Plan Note (Signed)
Cut out sugar & refined flours. Gave specific diet plan. Diabetes. Will do fasting labs in 8 weeks.

## 2014-05-30 NOTE — Telephone Encounter (Signed)
Will start actose. D/c metformin. Pt will start new bp med tonight. She will start checking BS-will call tomorrow w/test strips that her ins co will approve, then I will order. Pt will schedule lab appt. In 3 weeks to recheck sodium & potassium, ALT, AST (CMET).

## 2014-05-30 NOTE — Assessment & Plan Note (Signed)
Increase in A1C to 7.2 from 6.8. Feeling tired Made a few diet changes since last visit-more beans, whole grains, still eating ice cream Creat 1.3 Will have to stop metformin if creat increases to 1.4. Will consider referring to endo if needs to start insulin. Gave specific 8 week diet & exercise plan. Labs today. F/u 8 wks.

## 2014-05-30 NOTE — Telephone Encounter (Signed)
Hopefully stopping HCTZ will correct low sodium & potassium. Creat 1.4. Will stop metformin. Will discuss starting actos. Will check labs in 2 weeks. Called pt to discuss, unable to reach. LM.

## 2014-05-30 NOTE — Assessment & Plan Note (Addendum)
Fair control. BUN Creat elevated. Stable. Potassium mildly low. No LE edema. Does not recall Hx of edema. Feeling very tired. Will stop HCTZ. Pt instructed to check BP at home. F/u 6 weeks.

## 2014-05-30 NOTE — Assessment & Plan Note (Signed)
New dx of hypothyroidism. Pt feels nodule L lateral neck I cannot palpate, but L neck is subjectively larger than Right Neck US to r/o LAD, thyroid or PT nodule

## 2014-05-31 ENCOUNTER — Other Ambulatory Visit: Payer: Self-pay | Admitting: Nurse Practitioner

## 2014-05-31 DIAGNOSIS — E1165 Type 2 diabetes mellitus with hyperglycemia: Secondary | ICD-10-CM

## 2014-05-31 DIAGNOSIS — E118 Type 2 diabetes mellitus with unspecified complications: Principal | ICD-10-CM

## 2014-05-31 DIAGNOSIS — IMO0002 Reserved for concepts with insufficient information to code with codable children: Secondary | ICD-10-CM

## 2014-05-31 NOTE — Telephone Encounter (Signed)
Please order

## 2014-05-31 NOTE — Telephone Encounter (Signed)
Patient is requesting monitor, strips, lancets for One Touch

## 2014-06-01 ENCOUNTER — Other Ambulatory Visit: Payer: Self-pay | Admitting: Nurse Practitioner

## 2014-06-01 MED ORDER — ONETOUCH ULTRA SYSTEM W/DEVICE KIT: 1.0000 | PACK | Freq: Once | Status: DC

## 2014-06-01 NOTE — Progress Notes (Signed)
Ordered one touch device, strips, lancets so pt can check sugars on new med

## 2014-06-09 ENCOUNTER — Encounter: Payer: Self-pay | Admitting: Nurse Practitioner

## 2014-06-09 ENCOUNTER — Ambulatory Visit (INDEPENDENT_AMBULATORY_CARE_PROVIDER_SITE_OTHER): Payer: Medicare Other | Admitting: Nurse Practitioner

## 2014-06-09 ENCOUNTER — Other Ambulatory Visit: Payer: Self-pay

## 2014-06-09 VITALS — BP 169/80 | HR 77 | Temp 97.5°F | Resp 18 | Ht 62.5 in | Wt 154.0 lb

## 2014-06-09 DIAGNOSIS — R21 Rash and other nonspecific skin eruption: Secondary | ICD-10-CM

## 2014-06-09 DIAGNOSIS — M79674 Pain in right toe(s): Secondary | ICD-10-CM

## 2014-06-09 DIAGNOSIS — M79609 Pain in unspecified limb: Secondary | ICD-10-CM

## 2014-06-09 DIAGNOSIS — IMO0002 Reserved for concepts with insufficient information to code with codable children: Secondary | ICD-10-CM

## 2014-06-09 DIAGNOSIS — M109 Gout, unspecified: Secondary | ICD-10-CM | POA: Insufficient documentation

## 2014-06-09 DIAGNOSIS — E1165 Type 2 diabetes mellitus with hyperglycemia: Secondary | ICD-10-CM

## 2014-06-09 DIAGNOSIS — E871 Hypo-osmolality and hyponatremia: Secondary | ICD-10-CM

## 2014-06-09 DIAGNOSIS — I10 Essential (primary) hypertension: Secondary | ICD-10-CM

## 2014-06-09 DIAGNOSIS — F329 Major depressive disorder, single episode, unspecified: Secondary | ICD-10-CM

## 2014-06-09 DIAGNOSIS — E118 Type 2 diabetes mellitus with unspecified complications: Secondary | ICD-10-CM

## 2014-06-09 DIAGNOSIS — F32A Depression, unspecified: Secondary | ICD-10-CM

## 2014-06-09 HISTORY — DX: Gout, unspecified: M10.9

## 2014-06-09 LAB — COMPREHENSIVE METABOLIC PANEL
ALT: 28 U/L (ref 0–35)
AST: 27 U/L (ref 0–37)
Albumin: 4.4 g/dL (ref 3.5–5.2)
Alkaline Phosphatase: 53 U/L (ref 39–117)
BILIRUBIN TOTAL: 0.5 mg/dL (ref 0.2–1.2)
BUN: 18 mg/dL (ref 6–23)
CO2: 23 mEq/L (ref 19–32)
CREATININE: 1.4 mg/dL — AB (ref 0.4–1.2)
Calcium: 9.5 mg/dL (ref 8.4–10.5)
Chloride: 102 mEq/L (ref 96–112)
GFR: 38.02 mL/min — ABNORMAL LOW (ref >60.00)
GLUCOSE: 98 mg/dL (ref 70–99)
Potassium: 4.3 mEq/L (ref 3.5–5.1)
Sodium: 134 mEq/L — ABNORMAL LOW (ref 135–145)
TOTAL PROTEIN: 7.2 g/dL (ref 6.0–8.3)

## 2014-06-09 LAB — C-REACTIVE PROTEIN: CRP: <0.5 mg/dL (ref 0.5–20.0)

## 2014-06-09 LAB — URINALYSIS, MICROSCOPIC ONLY: RBC / HPF: NONE SEEN (ref 0–?)

## 2014-06-09 LAB — URIC ACID: URIC ACID, SERUM: 5.7 mg/dL (ref 2.4–7.0)

## 2014-06-09 MED ORDER — PREDNISONE 5 MG PO TABS: ORAL_TABLET | ORAL | Status: DC

## 2014-06-09 MED ORDER — PIOGLITAZONE HCL 30 MG PO TABS: 30.0000 mg | ORAL_TABLET | Freq: Every day | ORAL | Status: DC

## 2014-06-09 MED ORDER — METHYLPREDNISOLONE ACETATE 80 MG/ML IJ SUSP
80.0000 mg | Freq: Once | INTRAMUSCULAR | Status: AC
Start: 2014-06-09 — End: 2014-06-09
  Administered 2014-06-09: 80 mg via INTRAMUSCULAR

## 2014-06-09 MED ORDER — HYDROCODONE-ACETAMINOPHEN 5-325 MG PO TABS: 1.0000 | ORAL_TABLET | Freq: Three times a day (TID) | ORAL | Status: DC | PRN

## 2014-06-09 MED ORDER — FLUOXETINE HCL 20 MG PO TABS: ORAL_TABLET | ORAL | Status: DC

## 2014-06-09 NOTE — Assessment & Plan Note (Signed)
Increase actos to 30 mg qd. FBS 97-157. Stopped metformin due to increasing creat. New rash on R leg only.

## 2014-06-09 NOTE — Assessment & Plan Note (Signed)
Stopped HCTZ due to hyponatremia & hypokalemia. BP elevated today, perhaps due to pain-seems to have new onset gout in R great toe. CMET todayt. Continue losartan for now.

## 2014-06-09 NOTE — Patient Instructions (Addendum)
Start prednisone tomorrow morning.  Increase actos to 2 T daily (30 mg). When current tablets run out, get 30 mg tablets filled.  Sugars will run higher while on prednisone. Be diligent about not eating sugar & breads & cereals that have less than 4 grams fiber per serving.  We will call with lab results.  See me in 2 weeks.

## 2014-06-09 NOTE — Progress Notes (Signed)
Pre visit review using our clinic review tool, if applicable. No additional management support is needed unless otherwise documented below in the visit note. 

## 2014-06-09 NOTE — Assessment & Plan Note (Signed)
Looks like new onset gout. Triggers: Stopped HCTZ due to low sodium & potassium Diet changes-increased beans-high in purine. Depo-medrol today. Prednisone for 15 days. Check uric acid & urine today. Will start allopurinol or uloric soon.

## 2014-06-12 ENCOUNTER — Telehealth: Payer: Self-pay | Admitting: Nurse Practitioner

## 2014-06-12 ENCOUNTER — Ambulatory Visit (HOSPITAL_COMMUNITY)
Admission: RE | Admit: 2014-06-12 | Discharge: 2014-06-12 | Disposition: A | Discharge status: Home / Self Care | Payer: Medicare Other | Source: Ambulatory Visit | Attending: Nurse Practitioner | Admitting: Nurse Practitioner

## 2014-06-12 DIAGNOSIS — R220 Localized swelling, mass and lump, head: Secondary | ICD-10-CM | POA: Diagnosis not present

## 2014-06-12 DIAGNOSIS — R221 Localized swelling, mass and lump, neck: Secondary | ICD-10-CM | POA: Diagnosis not present

## 2014-06-12 DIAGNOSIS — R21 Rash and other nonspecific skin eruption: Secondary | ICD-10-CM | POA: Insufficient documentation

## 2014-06-12 IMAGING — US US SOFT TISSUE HEAD/NECK
1 series · 14 of 16 positions shown · non-contrast
Comparison: None.

CLINICAL DATA: Palpable area involving the submandibular aspect of
the left neck. This has been present for several years with
intermittent tenderness with potential subtle change an apparent
size.

EXAM:
ULTRASOUND OF HEAD/NECK SOFT TISSUES
TECHNIQUE: Ultrasound examination of the head and neck soft tissues was
performed in the area of clinical concern.

[Series 1: us soft tissue head/neck · 0.07mm/px · 14 of 16 slices shown]
[im 1/16]
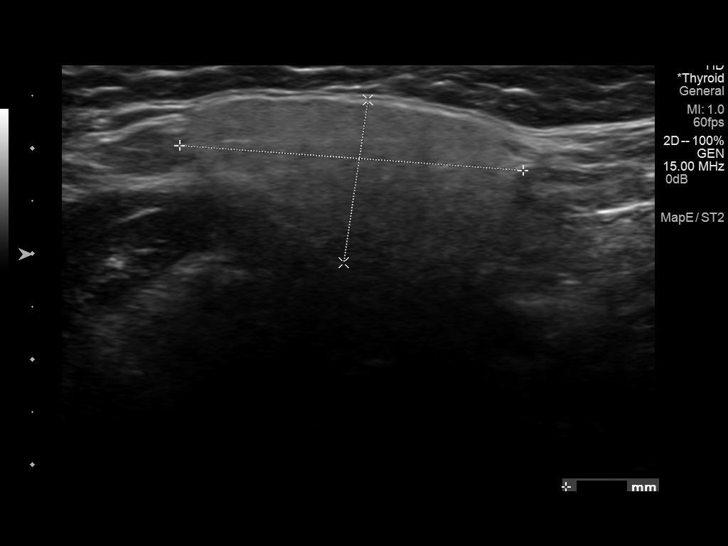
[im 2/16]
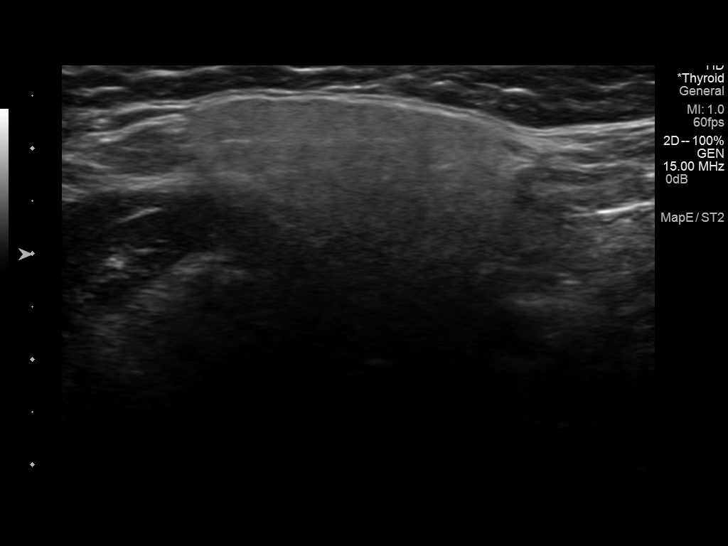
[im 3/16]
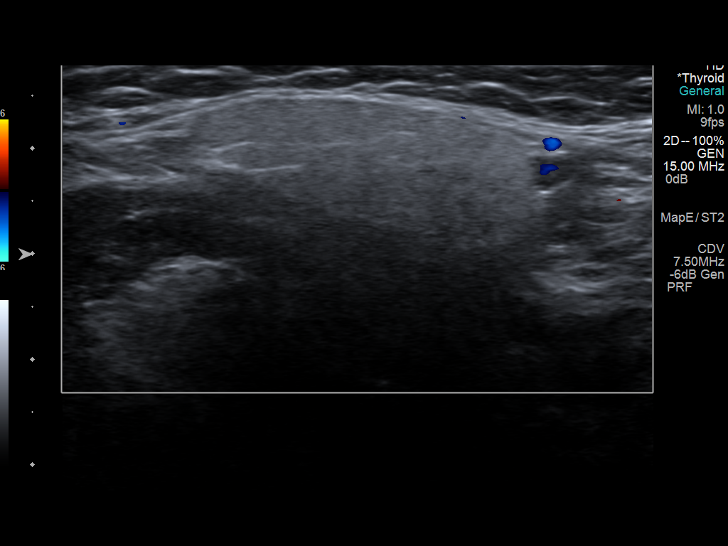
[im 5/16]
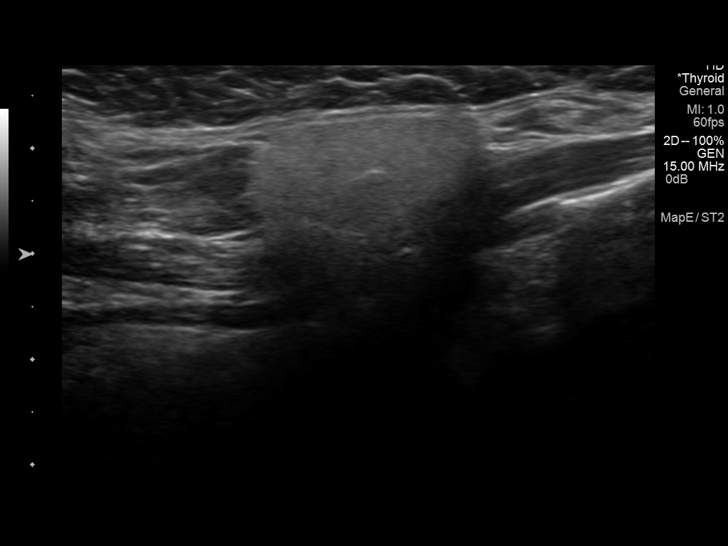
[im 6/16]
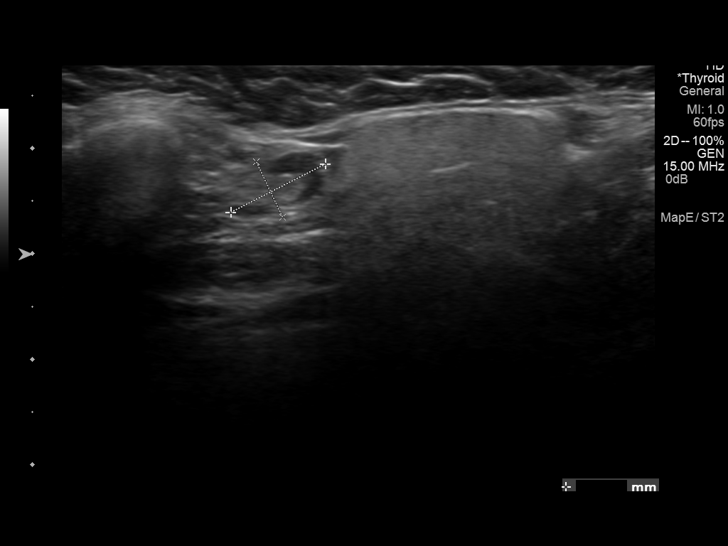
[im 7/16]
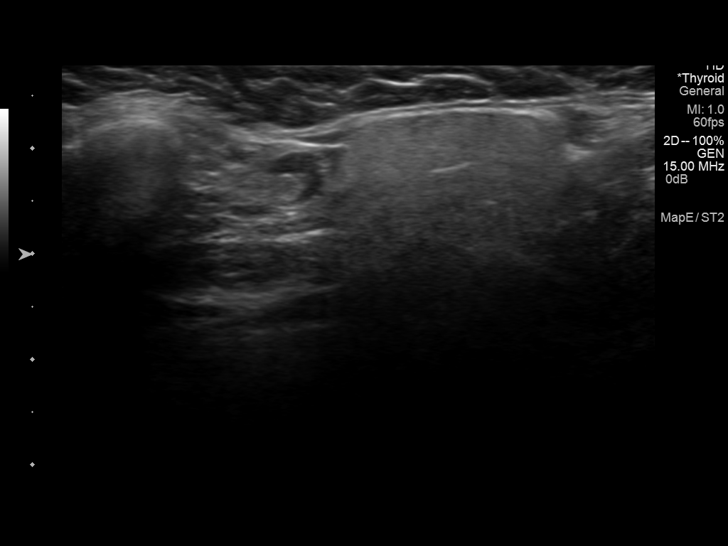
[im 8/16]
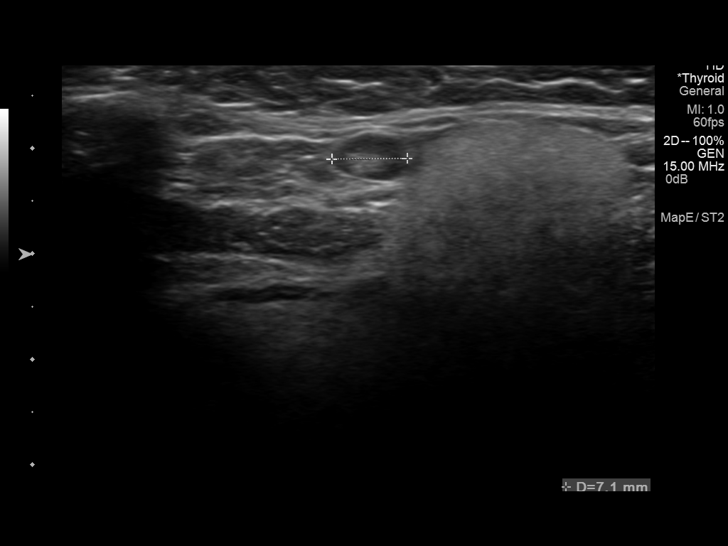
[im 9/16]
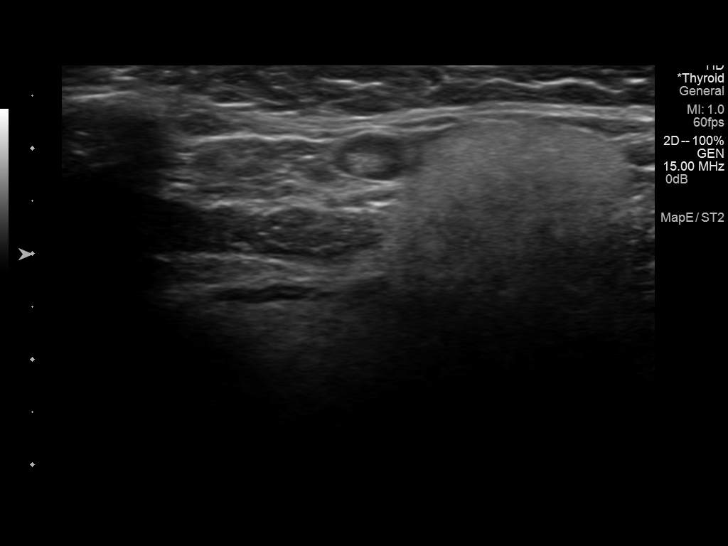
[im 10/16]
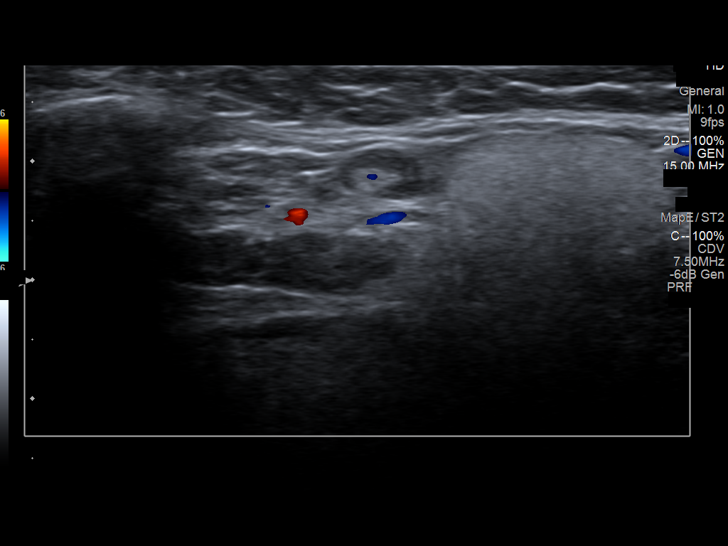
[im 11/16]
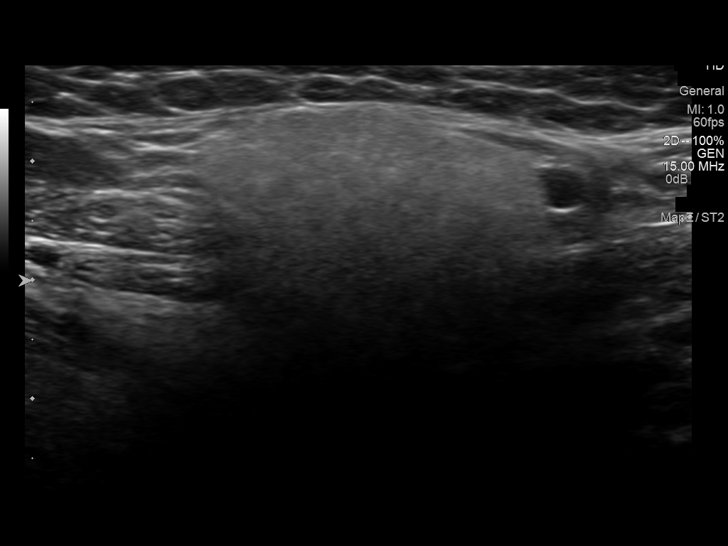
[im 13/16]
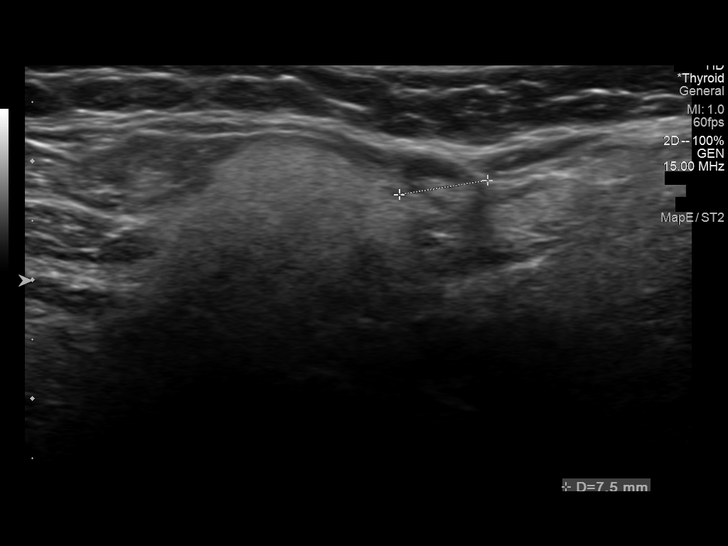
[im 14/16]
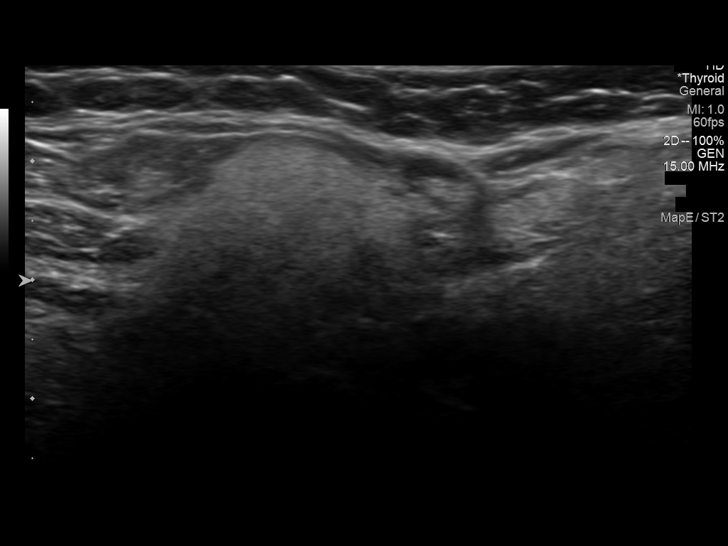
[im 15/16]
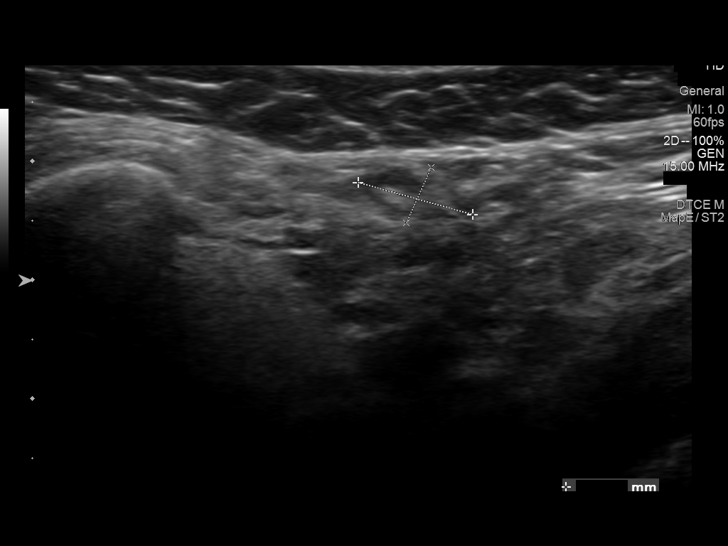
[im 16/16]
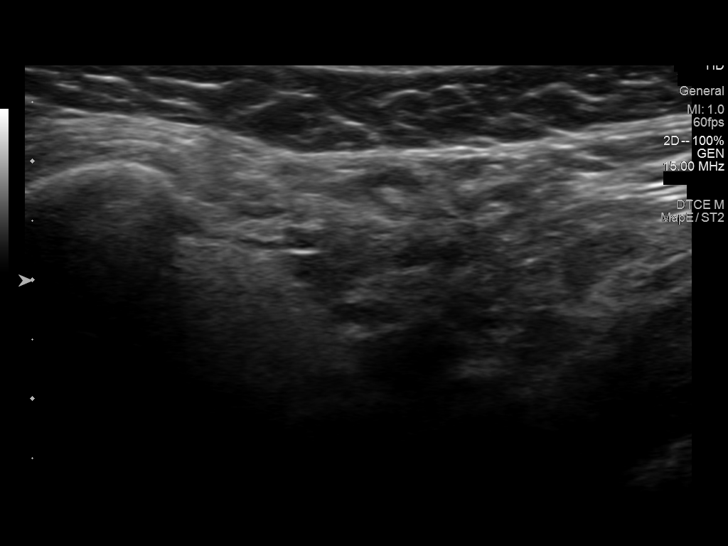

[14 of 16 positions shown; findings below may reference images not displayed]

FINDINGS: The palpable area of concern appears to correlate with a normal
appearing left submandibular gland measuring approximately 3.3 x
x 2.3 cm. There is no definitive dilatation of the submandibular
duct or associated pericolonic annular stranding. No definitive
intraglandular mass.

Note is made of several adjacent benign-appearing and non
pathologically enlarged lymph nodes with index nodes measuring
cm and 0.5 cm in greatest short axis diameter.
IMPRESSION: The patient's palpable area of concern appears to correlate with a
normal appearing submandibular gland. Otherwise, there is no
discrete solid or cystic mass to correlate with the patient's
palpable area of concern. Further evaluation could be performed with
contrast-enhanced neck CT as clinically indicated.

## 2014-06-12 NOTE — Progress Notes (Signed)
Subjective:     Elizabeth Mcintyre is a 73 y.o. female presents w/new c/o R great toe pain & swelling started 6 da. Accompanying symptoms include maculopapular rash on R leg only-pruritic at fierts, now fading & no longer itchy. Toe pain is worse w/weight bearing or contact on skin. She has not tried anything for pain. Pain is mod-severe. I changed meds about 10 days ago: started actos, stopped HCTZ due to low sodium, stopped metformin.  FBS are running 97-157.  The following portions of the patient's history were reviewed and updated as appropriate: allergies, current medications, past family history, past medical history, past social history, past surgical history and problem list.  Review of Systems Pertinent items are noted in HPI.    Objective:    BP 169/80  Pulse 77  Temp(Src) 97.5 F (36.4 C) (Oral)  Resp 18  Ht 5' 2.5" (1.588 m)  Wt 154 lb (69.854 kg)  BMI 27.70 kg/m2  SpO2 98% BP 169/80  Pulse 77  Temp(Src) 97.5 F (36.4 C) (Oral)  Resp 18  Ht 5' 2.5" (1.588 m)  Wt 154 lb (69.854 kg)  BMI 27.70 kg/m2  SpO2 98% General appearance: alert, cooperative, appears stated age and mild distress Head: Normocephalic, without obvious abnormality, atraumatic Eyes: negative findings: lids and lashes normal and conjunctivae and sclerae normal Extremities: Swollen R great toe, red at MTP joint on volar side, warm.   Pulses: 2+ and symmetric Skin: maculopap rash diffuse on R leg from ankle to upper thigh. Some scabs, does not blanch, lesions about 3 mm.    Assessment:  1. Toe pain, right - Comprehensive metabolic panel - Uric acid - C-reactive protein - Urine Microscopic - predniSONE (DELTASONE) 5 MG tablet; Take 3 T po QD X 5d, then 2T po QD X 5 d, then 1T po qd X5.  Dispense: 30 tablet; Refill: 0 - HYDROcodone-acetaminophen (NORCO/VICODIN) 5-325 MG per tablet; Take 1 tablet by mouth every 8 (eight) hours as needed for moderate pain.  Dispense: 30 tablet; Refill: 0 -  methylPREDNISolone acetate (DEPO-MEDROL) injection 80 mg; Inject 1 mL (80 mg total) into the muscle once.  2. Hyponatremia - Comprehensive metabolic panel  3. Type II or unspecified type diabetes mellitus with unspecified complication, uncontrolled - pioglitazone (ACTOS) 30 MG tablet; Take 1 tablet (30 mg total) by mouth daily. Take with largest meal of day.  Dispense: 30 tablet; Refill: 2  4. Unspecified essential hypertension  5. Rash  See problem list for complete A&P See pt instructions. F/u 2w

## 2014-06-12 NOTE — Telephone Encounter (Signed)
Left message for pt to call back  °

## 2014-06-12 NOTE — Telephone Encounter (Signed)
pls ask pt if toe pain  & leg rash are better.

## 2014-06-12 NOTE — Assessment & Plan Note (Signed)
Duration 6d R leg only-same foot is swollen & painful Phlebitis? Vasculitis? Looks like gout in R great toe-makes since considering she has increased beans (purine) in diet & I d/c'd HCTZ. Pt states getting better. Also had "restless leg " type symptoms in that leg for few nights. Neg Homan's sign.

## 2014-06-12 NOTE — Assessment & Plan Note (Signed)
Stopped HCTZ, continued losartan. Will check BMET today.

## 2014-06-13 NOTE — Telephone Encounter (Signed)
Pt returned your call and left a message stating that her foot was feeling much better

## 2014-06-13 NOTE — Telephone Encounter (Signed)
Left message for pt to call back  °

## 2014-06-14 ENCOUNTER — Ambulatory Visit: Payer: Medicare Other | Admitting: Nurse Practitioner

## 2014-06-21 ENCOUNTER — Other Ambulatory Visit: Payer: Self-pay | Admitting: *Deleted

## 2014-06-21 DIAGNOSIS — E039 Hypothyroidism, unspecified: Secondary | ICD-10-CM

## 2014-06-21 MED ORDER — LEVOTHYROXINE SODIUM 25 MCG PO TABS: 25.0000 ug | ORAL_TABLET | Freq: Every day | ORAL | Status: DC

## 2014-06-23 ENCOUNTER — Ambulatory Visit (INDEPENDENT_AMBULATORY_CARE_PROVIDER_SITE_OTHER): Payer: Medicare Other | Admitting: Nurse Practitioner

## 2014-06-23 ENCOUNTER — Encounter: Payer: Self-pay | Admitting: Nurse Practitioner

## 2014-06-23 VITALS — BP 145/82 | HR 58 | Temp 98.0°F | Ht 62.5 in | Wt 154.0 lb

## 2014-06-23 DIAGNOSIS — H6592 Unspecified nonsuppurative otitis media, left ear: Secondary | ICD-10-CM

## 2014-06-23 DIAGNOSIS — M109 Gout, unspecified: Secondary | ICD-10-CM

## 2014-06-23 DIAGNOSIS — E1129 Type 2 diabetes mellitus with other diabetic kidney complication: Secondary | ICD-10-CM

## 2014-06-23 DIAGNOSIS — M1000 Idiopathic gout, unspecified site: Secondary | ICD-10-CM

## 2014-06-23 MED ORDER — ALLOPURINOL 100 MG PO TABS: 100.0000 mg | ORAL_TABLET | Freq: Every day | ORAL | Status: DC

## 2014-06-23 MED ORDER — FLUTICASONE PROPIONATE 50 MCG/ACT NA SUSP: NASAL | Status: DC

## 2014-06-23 MED ORDER — COLCHICINE 0.6 MG PO TABS: 0.3000 mg | ORAL_TABLET | Freq: Every day | ORAL | Status: DC

## 2014-06-23 NOTE — Patient Instructions (Signed)
Start allopurinol & colchicine. Take each daily.  Stop rosuvastatin for few months while on colchicine. I will re-start it when you no longer need colchicine-maybe in 6 mos.  Continue actos, but if sugars are running under 80, take 1 pill daily instead of 2.  Start flonase daily 1 spray in L nare twice daily.  Resume sinus rinses for best allergy management.  No NSAIDS-advil, ibuporphen, motrin, aleve.  Tylenol only for pain or I will prescribe something if needed.  See you in 3 weeks. I will do labs.

## 2014-06-23 NOTE — Progress Notes (Signed)
Pre visit review using our clinic review tool, if applicable. No additional management support is needed unless otherwise documented below in the visit note. 

## 2014-06-28 DIAGNOSIS — H7291 Unspecified perforation of tympanic membrane, right ear: Secondary | ICD-10-CM | POA: Insufficient documentation

## 2014-06-28 NOTE — Progress Notes (Signed)
Subjective:     Elizabeth Mcintyre is a 73 y.o. female who presents for follow up of possible gout of R great toe. She was seen in office 2 weeks ago with red swollen painful R 1st MTP. She is finishing 12 day course of prednisone and is much improved. Redness & warmth has resolved. She still has tenderness when joint is squeezed. Plan is to start colchicine & allopurinol. She will remain on colchicine for 6 mos with no flares & uric acid under 6.0. Allopurinol will likely continue life-long. Also, at last visit, I stopped HCTZ as sodium was too low. It has corrected & BP is OK.  She had a rash at last visit on R leg only. Today rash is better-fewer lesions, but still some new lesions-also on L leg & trunk. Pt states she has had this rash in past & it has been self-limiting. I changed her DM regimen due to increasing creat: stopped metformin & started 15 mg actos qd. Pt states FBS are 95-107 (while on prednisone). Lowest prandial BS was 79-she felt little shaky.  The following portions of the patient's history were reviewed and updated as appropriate: allergies, current medications, past medical history, past social history, past surgical history and problem list.  Review of Systems Pertinent items are noted in HPI.    Objective:    BP 145/82  Pulse 58  Temp(Src) 98 F (36.7 C) (Temporal)  Ht 5' 2.5" (1.588 m)  Wt 154 lb (69.854 kg)  BMI 27.70 kg/m2  SpO2 98% General appearance: alert, cooperative, appears stated age and no distress Head: Normocephalic, without obvious abnormality, atraumatic Eyes: negative findings: lids and lashes normal and conjunctivae and sclerae normal Extremities: no erythema or warmth in great toe-much improved, still tender in 1st MTP  Skin: continues to have papular rash w/red lesion-47mm on normal tone base.-bilat legs, trunk. Overall, rash on R leg has improved w/fewer lesions.   Ears: Hole in R TM, clear fluid behind L TM, bones visible   Assessment:   1. Gout  of big toe - allopurinol (ZYLOPRIM) 100 MG tablet; Take 1 tablet (100 mg total) by mouth daily.  Dispense: 30 tablet; Refill: 6 - colchicine 0.6 MG tablet; Take 0.5 tablets (0.3 mg total) by mouth daily.  Dispense: 45 tablet; Refill: 1  2. Otitis media with effusion, left - fluticasone (FLONASE) 50 MCG/ACT nasal spray; 1 spray L nare twice daily.  Dispense: 16 g; Refill: 6  3. 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 F/u  3 wks for uric acid, TSH, BMET, A1C

## 2014-06-28 NOTE — Assessment & Plan Note (Signed)
Increase actos to 30 mg qd. Will check Bmet in few weeks. Coming off of 12 days prednisone for gout. FBS 95-107 Lowest prandial 79. Advise to take 15 mg actos if bs under 80.

## 2014-06-28 NOTE — Assessment & Plan Note (Addendum)
Improvement w/prednisone.  Start allopurinol & colchicine. Check uric acid in few weeks. NO NSAIDS as creat is 1.4. Low purine diet

## 2014-06-28 NOTE — Assessment & Plan Note (Signed)
flonase

## 2014-07-11 ENCOUNTER — Ambulatory Visit: Payer: Medicare Other | Admitting: Nurse Practitioner

## 2014-07-13 ENCOUNTER — Encounter: Payer: Self-pay | Admitting: Nurse Practitioner

## 2014-07-13 ENCOUNTER — Telehealth: Payer: Self-pay | Admitting: Nurse Practitioner

## 2014-07-13 ENCOUNTER — Ambulatory Visit (HOSPITAL_COMMUNITY)
Admission: RE | Admit: 2014-07-13 | Discharge: 2014-07-13 | Disposition: A | Discharge status: Home / Self Care | Payer: Medicare Other | Source: Ambulatory Visit | Attending: Nurse Practitioner | Admitting: Nurse Practitioner

## 2014-07-13 ENCOUNTER — Ambulatory Visit (INDEPENDENT_AMBULATORY_CARE_PROVIDER_SITE_OTHER): Payer: Medicare Other | Admitting: Nurse Practitioner

## 2014-07-13 VITALS — BP 158/86 | HR 69 | Temp 97.5°F | Ht 62.5 in | Wt 155.0 lb

## 2014-07-13 DIAGNOSIS — H7291 Unspecified perforation of tympanic membrane, right ear: Secondary | ICD-10-CM

## 2014-07-13 DIAGNOSIS — E119 Type 2 diabetes mellitus without complications: Secondary | ICD-10-CM

## 2014-07-13 DIAGNOSIS — E039 Hypothyroidism, unspecified: Secondary | ICD-10-CM

## 2014-07-13 DIAGNOSIS — M533 Sacrococcygeal disorders, not elsewhere classified: Secondary | ICD-10-CM

## 2014-07-13 DIAGNOSIS — R7989 Other specified abnormal findings of blood chemistry: Secondary | ICD-10-CM

## 2014-07-13 DIAGNOSIS — G8929 Other chronic pain: Secondary | ICD-10-CM | POA: Insufficient documentation

## 2014-07-13 DIAGNOSIS — F32A Depression, unspecified: Secondary | ICD-10-CM

## 2014-07-13 DIAGNOSIS — R21 Rash and other nonspecific skin eruption: Secondary | ICD-10-CM

## 2014-07-13 DIAGNOSIS — F329 Major depressive disorder, single episode, unspecified: Secondary | ICD-10-CM

## 2014-07-13 DIAGNOSIS — M1000 Idiopathic gout, unspecified site: Secondary | ICD-10-CM

## 2014-07-13 DIAGNOSIS — E785 Hyperlipidemia, unspecified: Secondary | ICD-10-CM

## 2014-07-13 DIAGNOSIS — R748 Abnormal levels of other serum enzymes: Secondary | ICD-10-CM

## 2014-07-13 DIAGNOSIS — M109 Gout, unspecified: Secondary | ICD-10-CM

## 2014-07-13 DIAGNOSIS — I10 Essential (primary) hypertension: Secondary | ICD-10-CM

## 2014-07-13 DIAGNOSIS — J321 Chronic frontal sinusitis: Secondary | ICD-10-CM | POA: Insufficient documentation

## 2014-07-13 LAB — HEMOGLOBIN A1C: Hgb A1c MFr Bld: 6.5 % (ref 4.6–6.5)

## 2014-07-13 LAB — BASIC METABOLIC PANEL
BUN: 21 mg/dL (ref 6–23)
CHLORIDE: 103 meq/L (ref 96–112)
CO2: 26 meq/L (ref 19–32)
Calcium: 9.6 mg/dL (ref 8.4–10.5)
Creatinine, Ser: 1.3 mg/dL — ABNORMAL HIGH (ref 0.4–1.2)
GFR: 42.77 mL/min — ABNORMAL LOW (ref >60.00)
Glucose, Bld: 79 mg/dL (ref 70–99)
Potassium: 4.2 mEq/L (ref 3.5–5.1)
Sodium: 137 mEq/L (ref 135–145)

## 2014-07-13 LAB — URIC ACID: URIC ACID, SERUM: 5 mg/dL (ref 2.4–7.0)

## 2014-07-13 IMAGING — CR DG LUMBAR SPINE 2-3V
3 series · 3 of 3 positions shown · non-contrast
Comparison: [DATE]

CLINICAL DATA: Chronic right SI pain

EXAM:
LUMBAR SPINE - 2-3 VIEW

[view not recorded (1 of 3)]
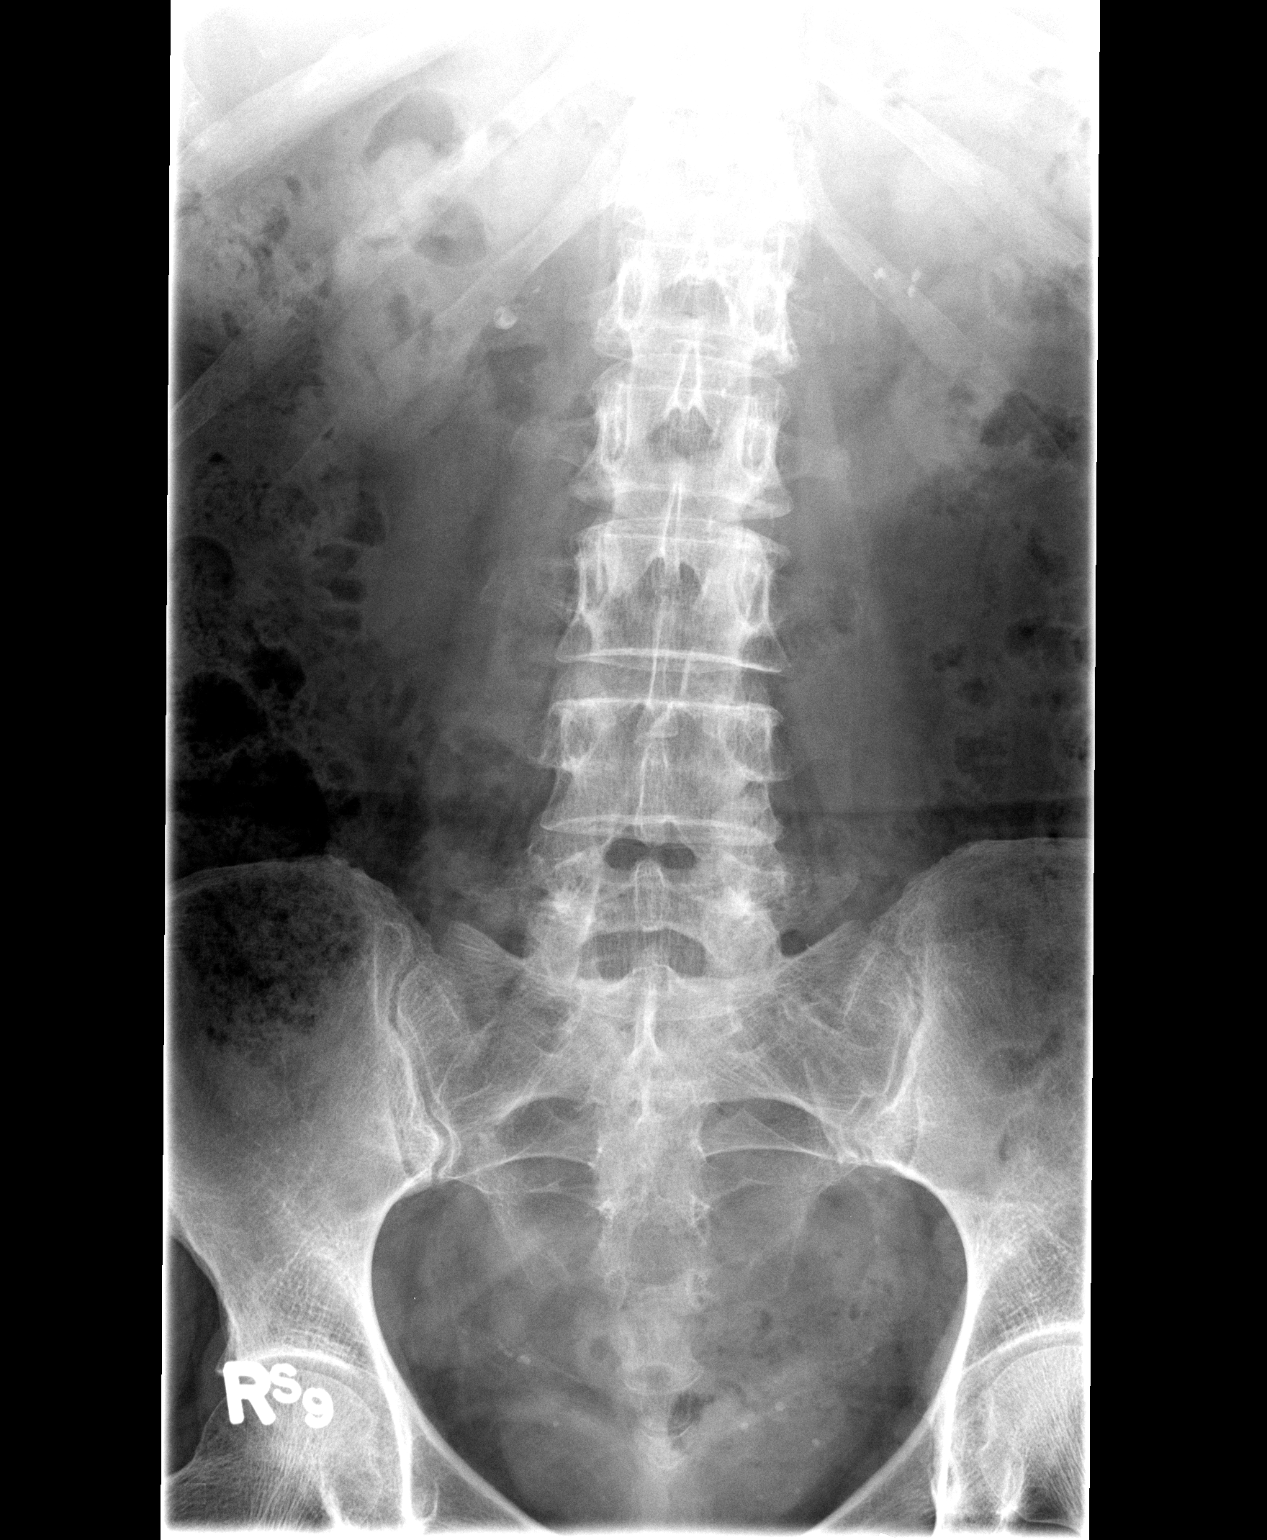

[view not recorded (2 of 3)]
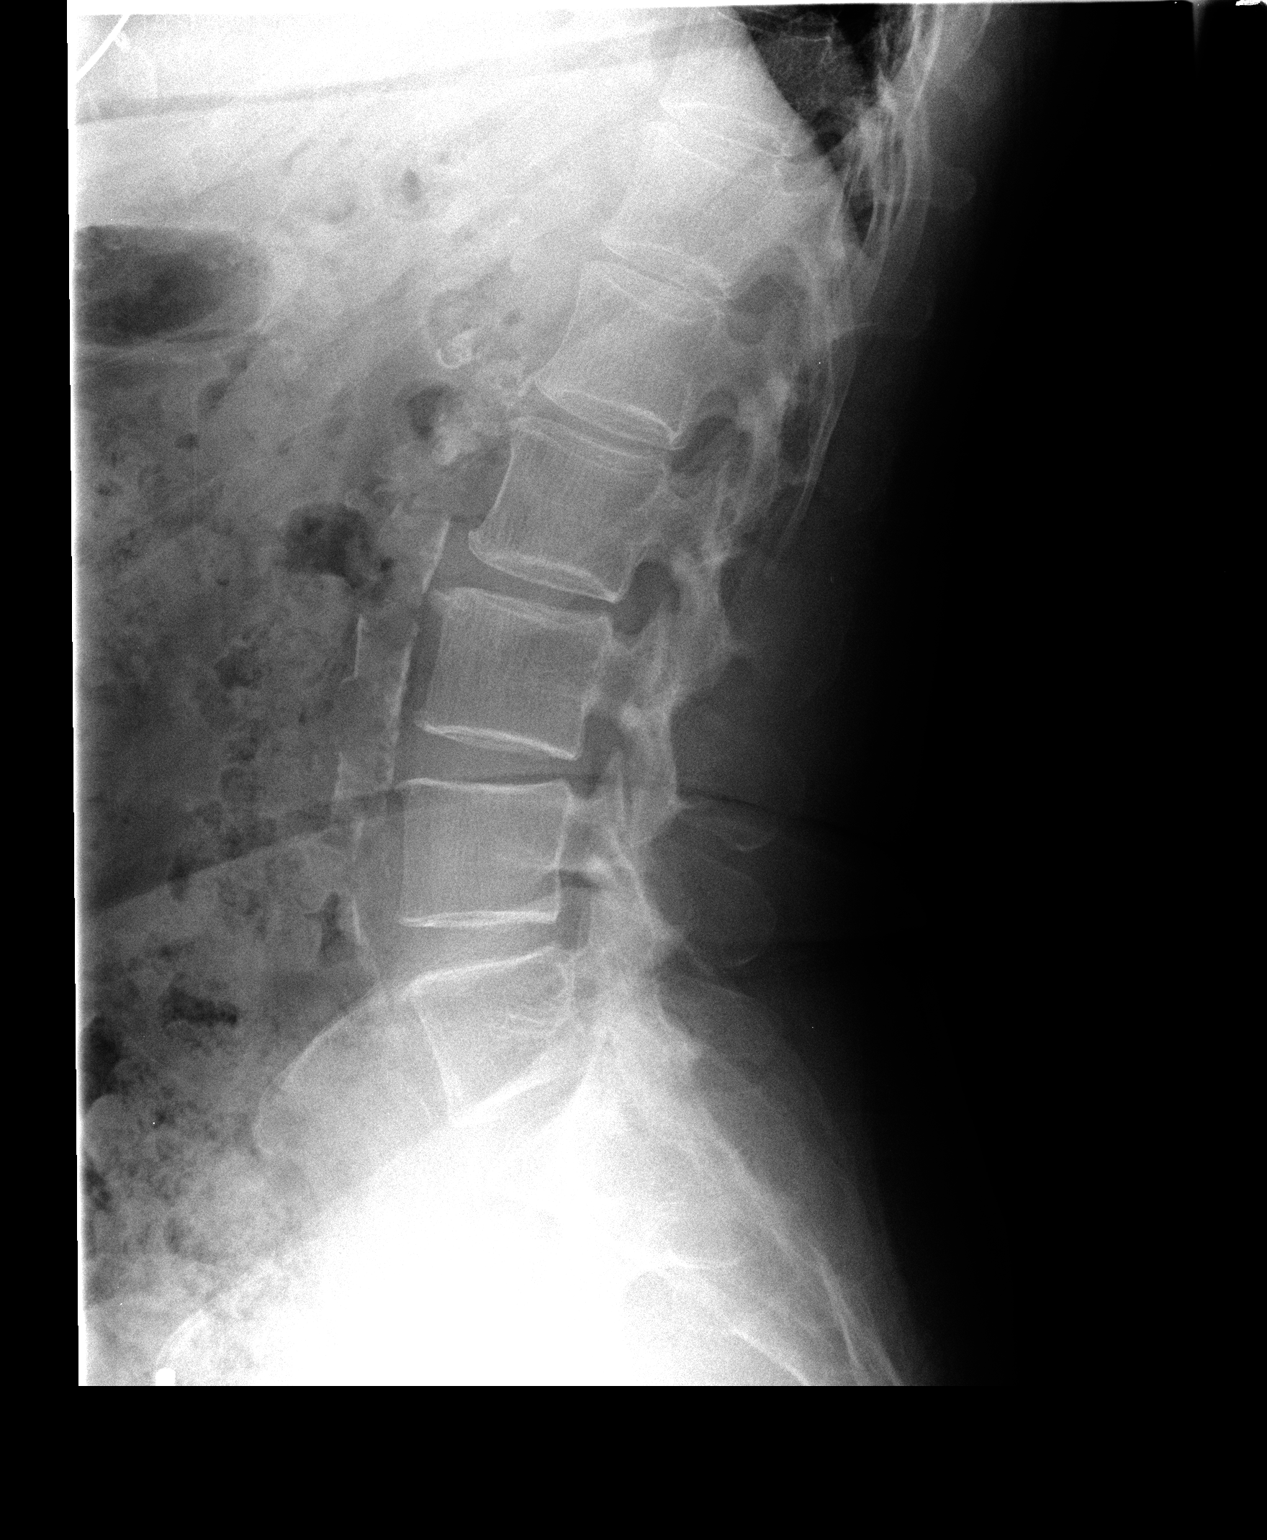

[view not recorded (3 of 3)]
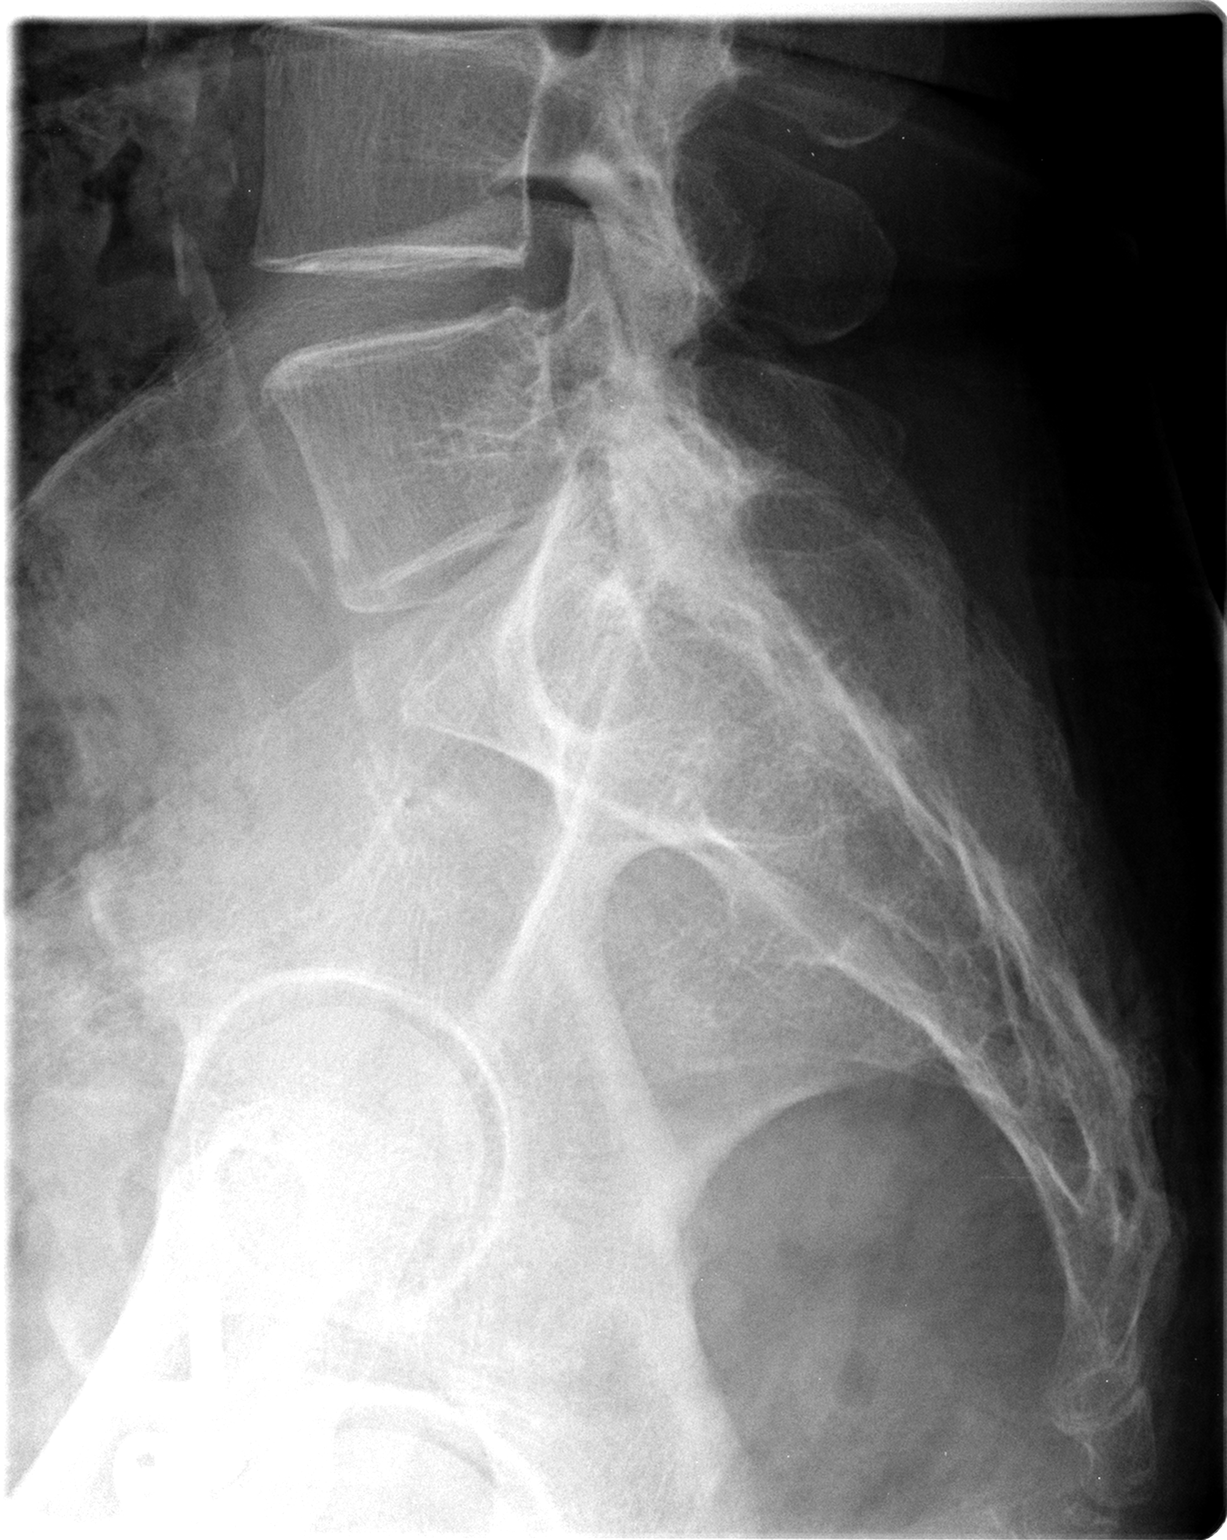

[3 of 3 positions shown; findings below may reference images not displayed]

FINDINGS: There is no evidence of lumbar spine fracture. Alignment is normal.
Intervertebral disc spaces are maintained. SI joints normal
bilaterally.
IMPRESSION: Negative.

## 2014-07-13 MED ORDER — AMOXICILLIN-POT CLAVULANATE 875-125 MG PO TABS: 1.0000 | ORAL_TABLET | Freq: Two times a day (BID) | ORAL | Status: DC

## 2014-07-13 MED ORDER — TRAMADOL HCL 50 MG PO TABS: ORAL_TABLET | ORAL | Status: DC

## 2014-07-13 MED ORDER — FLUOXETINE HCL (PMDD) 20 MG PO CAPS: 20.0000 mg | ORAL_CAPSULE | Freq: Every day | ORAL | Status: DC

## 2014-07-13 NOTE — Assessment & Plan Note (Signed)
Has occasional sharp pain in ear, denies hearing loss, hears "heart beat" in ear. Reports distant ear infection w/perf, recalls she was told perf closed. Unaware perf persists. Pt will consider ENT referral Treating for sinusitis F/u 2 wks

## 2014-07-13 NOTE — Assessment & Plan Note (Signed)
Lowest BS 87-felt little dizzy, highest 170-ate sugary food. Continue current meds Daily exercise Diet modification to decrease refined sugar & grains. BMET, A1C today.

## 2014-07-13 NOTE — Assessment & Plan Note (Signed)
resolved 

## 2014-07-13 NOTE — Progress Notes (Signed)
Subjective:     Elizabeth Mcintyre is a 73 y.o. female presents for follow up of chronic conditions: new onset gout, HTN, DM, recent creat elevation, rash, hypothyroidism, And continues to c/u R-sided low back pain radiating to R leg & abdomen. I started treatment for gout about 8 wks ago when pt presented w/1st episode of red warm, painful great toe. Since starting pred taper, followed by colchicine & allopurinol, symptoms have improved. She continues to experience sharp shooting pain in great toe & ball of foot. Attack was preceeded by diet changes that included daily serving of beans. Pt has cut back beans to 3 times/week. BP is not well controlled today, likely due to not having meds for 2 days-pt did not realize she has refills on scripts. Creat 1.4 DM is well-controlled: BS range from 87 to 170 taking 30 mg actos and diet modification. Metformin was stopped due to elevated creat. Rash on leg is completely resolved. Etio unknown. Was worse when had gout flare. Pt denies S&S of thyroid disease She continues to take levothyroxine as prescribed. R-sided back pain has been long-standing issue, but she has not had back xrays. She c/o radiation to Rhip, leg, & abdomen.  The following portions of the patient's history were reviewed and updated as appropriate: allergies, current medications, past medical history, past social history, past surgical history and problem list.  Review of Systems Pertinent items are noted in HPI.    Objective:    BP 158/86  Pulse 69  Temp(Src) 97.5 F (36.4 C) (Temporal)  Ht 5' 2.5" (1.588 m)  Wt 155 lb (70.308 kg)  BMI 27.88 kg/m2  SpO2 98% BP 158/86  Pulse 69  Temp(Src) 97.5 F (36.4 C) (Temporal)  Ht 5' 2.5" (1.588 m)  Wt 155 lb (70.308 kg)  BMI 27.88 kg/m2  SpO2 98% General appearance: alert, cooperative, appears stated age and no distress Head: Normocephalic, without obvious abnormality, atraumatic Eyes: negative findings: lids and lashes normal and  conjunctivae and sclerae normal Throat: lips, mucosa, and tongue normal; teeth and gums normal Back: tender over lumbar spine & R SI joint Lungs: clear to auscultation bilaterally Heart: regular rate and rhythm, S1, S2 normal, no murmur, click, rub or gallop Extremities: tender MTP joint if squeezed, tender when MTP joints squeezed Skin: Skin color, texture, turgor normal. No rashes or lesions Lymph nodes: Cervical, supraclavicular, and axillary nodes normal. and no cervical or [place] LAD    Assessment:     1. Elevated serum creatinine - Basic metabolic panel  2. Diabetes mellitus type 2, controlled - Hemoglobin A1c  3. Hypothyroidism, unspecified hypothyroidism type - TSH  4. Idiopathic gout, unspecified chronicity, unspecified site - Uric acid  5. Chronic right SI joint pain - traMADol (ULTRAM) 50 MG tablet; Take 1T po qhs X 3d, then take 1 T po bid  Dispense: 60 tablet; Refill: 1 - DG Lumbar Spine 2-3 Views; Future - DG Pelvis 1-2 Views; Future  6. Chronic frontal sinusitis - amoxicillin-clavulanate (AUGMENTIN) 875-125 MG per tablet; Take 1 tablet by mouth 2 (two) times daily.  Dispense: 10 tablet; Refill: 0  7. Depression - Fluoxetine HCl, PMDD, 20 MG CAPS; Take 1 capsule (20 mg total) by mouth at bedtime.  Dispense: 90 each; Refill: 1  8. Essential hypertension  9. Perforation of right tympanic membrane  10. Rash  11. Gout of big toe  12. Dyslipidemia  See problem list for complete A&P See pt instructions. F/u  2wks

## 2014-07-13 NOTE — Telephone Encounter (Signed)
pls call pt: Advise Back xrays don't show obvious problem. I think pain will get better with stretches. Will discuss further at ofc visit.

## 2014-07-13 NOTE — Assessment & Plan Note (Signed)
Bp not controlled today-pt has not had meds in 2 days- ran out, didn't realize she had refills. Continue diet modification Daily exercise Continue meds.  F/u 2 wks.

## 2014-07-13 NOTE — Assessment & Plan Note (Signed)
Changed tabs to capsules. Continue current dose.

## 2014-07-13 NOTE — Assessment & Plan Note (Signed)
No recent changes in meds TSH today No reported s&s of thryoid disease

## 2014-07-13 NOTE — Progress Notes (Signed)
Pre visit review using our clinic review tool, if applicable. No additional management support is needed unless otherwise documented below in the visit note. 

## 2014-07-13 NOTE — Patient Instructions (Signed)
You have 2 refills left on medicines. Please refill & resume.  Start antibiotic for sinus pressure.  Eat yogurt daily to help prevent antibiotic -associated diarrhea Continue flonase & daily sinus rinse. Start daily loratadine 10 mg.  Continue actos, colchicine, & allopurinol, fluoxetine.  Keep following food plan for best nutrition. Stay away from sweet foods & drinks except fresh fruit. Eat beans 3 times/week.  Back stretches twice daily as shown-should take about 10 minutes each time. Use heat on low back. Use aspercream on low back three times daily. Get xray.  See me in 2 weeks to re-evaluate sinus pressure.

## 2014-07-14 ENCOUNTER — Telehealth: Payer: Self-pay | Admitting: Nurse Practitioner

## 2014-07-14 LAB — TSH: TSH: 2.73 u[IU]/mL (ref 0.35–4.50)

## 2014-07-14 NOTE — Assessment & Plan Note (Addendum)
Warmth & swelling resolved Still having occasional shooting pain in 1st MTP & ball of foot. Uric acid today, will titrate PRN to keep uric acid under 6.0 Continue colchicine until no flare for 6 mos. Continue allopurinol BMET today

## 2014-07-14 NOTE — Assessment & Plan Note (Signed)
Tender over R SI joint Pain radiates to R leg & abdomen, wakes at night Demonstrated stretches Start tramadol Get xrays

## 2014-07-14 NOTE — Assessment & Plan Note (Addendum)
Duration: 2 mos. Waking w/frontal sinus HA every morning. Sinus rinses have decreased post-nasal drip. Hears heart beat in ear, C?o aural fullness Start augmentin, daily claritin, continue flonase F/y 2 wks

## 2014-07-14 NOTE — Telephone Encounter (Signed)
emmi emailed °

## 2014-07-14 NOTE — Telephone Encounter (Signed)
Patient notified of results. Patient expressed understanding.  

## 2014-07-16 ENCOUNTER — Telehealth: Payer: Self-pay | Admitting: Nurse Practitioner

## 2014-07-16 NOTE — Telephone Encounter (Signed)
pls call pt: Advise Labs look great! A1C down to 6.5! Continue with changes & actos. Uric acid has come down, so no dose change in allopurinol-continue taking. Thyroid looks good.

## 2014-07-17 NOTE — Telephone Encounter (Signed)
LMOVM for pt to return call concerning results.

## 2014-07-17 NOTE — Telephone Encounter (Signed)
Patient returned call and was given results. Patient expressed understanding. 

## 2014-07-27 ENCOUNTER — Ambulatory Visit (INDEPENDENT_AMBULATORY_CARE_PROVIDER_SITE_OTHER): Payer: Medicare Other | Admitting: Nurse Practitioner

## 2014-07-27 ENCOUNTER — Encounter: Payer: Self-pay | Admitting: Nurse Practitioner

## 2014-07-27 VITALS — BP 148/82 | HR 78 | Temp 98.0°F | Resp 18 | Ht 62.5 in | Wt 155.0 lb

## 2014-07-27 DIAGNOSIS — M109 Gout, unspecified: Secondary | ICD-10-CM

## 2014-07-27 DIAGNOSIS — H7290 Unspecified perforation of tympanic membrane, unspecified ear: Secondary | ICD-10-CM | POA: Insufficient documentation

## 2014-07-27 DIAGNOSIS — M1000 Idiopathic gout, unspecified site: Secondary | ICD-10-CM

## 2014-07-27 DIAGNOSIS — I10 Essential (primary) hypertension: Secondary | ICD-10-CM

## 2014-07-27 DIAGNOSIS — R1030 Lower abdominal pain, unspecified: Secondary | ICD-10-CM | POA: Insufficient documentation

## 2014-07-27 DIAGNOSIS — H7291 Unspecified perforation of tympanic membrane, right ear: Secondary | ICD-10-CM

## 2014-07-27 MED ORDER — AMLODIPINE BESYLATE 5 MG PO TABS: 5.0000 mg | ORAL_TABLET | Freq: Every day | ORAL | Status: DC

## 2014-07-27 NOTE — Progress Notes (Signed)
Subjective:     Elizabeth Mcintyre is a 73 y.o. female who presents for f/u of sinusitsis & back pain that radiates to abdomen. She was seen in ofc 2 wks ago c/o low back pain that radiates to R hip & leg & anterior abdomen: lumbar & pelvic xrays ordered-nml. She is doing daily pelvic & back stretches w/some relief. Pain is mostly at night when lies down. She adds that legs often feel fidgety when she has pain. Leg stretches seem to help. She wonders if tummy tuck she had years ago is contributing to abd pain. Hx partial hysterectomy. Re sinusitis: symptoms have improved, but not resolved. Ears still feel stuffy-mostly R ear, which has perforation. Gout flare in toe resolved. Still taking allopurinol & colchicine w/out SE, but c/o cost of colchicine. Last uric acid at goal.  The following portions of the patient's history were reviewed and updated as appropriate: allergies, current medications, past medical history, past social history, past surgical history and problem list.  Review of Systems Constitutional: negative for fevers Ears, nose, mouth, throat, and face: positive for stuffy ears, negative for nasal congestion Gastrointestinal: positive for abdominal pain, negative for constipation and diarrhea Genitourinary:negative for dysuria Musculoskeletal:positive for back pain, negative for muscle weakness Neurological: negative, paresthesia    Objective:    BP 148/82 mmHg  Pulse 78  Temp(Src) 98 F (36.7 C) (Oral)  Resp 18  Ht 5' 2.5" (1.588 m)  Wt 155 lb (70.308 kg)  BMI 27.88 kg/m2  SpO2 99% BP 148/82 mmHg  Pulse 78  Temp(Src) 98 F (36.7 C) (Oral)  Resp 18  Ht 5' 2.5" (1.588 m)  Wt 155 lb (70.308 kg)  BMI 27.88 kg/m2  SpO2 99% General appearance: alert, cooperative, appears stated age and no distress Head: Normocephalic, without obvious abnormality, atraumatic Eyes: negative findings: lids and lashes normal and conjunctivae and sclerae normal Ears: abnormal TM right ear -  appears to have perforation at anterior to handle of malleus and abnormal TM left ear - air-fluid level Abdomen: normal findings: no masses palpable and abnormal findings:  scar across low abdomen, tender low abdomen R & L quad    Assessment:    1. Essential hypertension Add 5 mg norvasc if bp over 135/85 at home 2. Lower abdominal pain Offered abd [place], pt wants to wait few more weeks 3. Tympanic membrane perforation, nontraumatic, right Does not want to see ENT about perf repair Continue flonase daily. 4. gout Flare resolved Uric acid at goal. colchicine too expensive. Take 1 more month, then d/c.  F/u 2 to 3 mos

## 2014-07-27 NOTE — Progress Notes (Signed)
Pre visit review using our clinic review tool, if applicable. No additional management support is needed unless otherwise documented below in the visit note. 

## 2014-07-27 NOTE — Patient Instructions (Addendum)
Take remainder of colchicine, then stop. Continue allopurinol. Let me know if you have a gout flare.  Please record 2 blood pressures weekly at home over next 3 weeks. If readings are over 150/90, call me. When checking blood pressure, feet should be flat on floor, cuff at level of heart, sit for 5 minutes before taking reading.  Continue pelvic stretches. Stretch legs before bedtime.  Let me know if abdominal persists & I will order CT scan.  See you in 3 months.  Happy Holidays!

## 2014-08-02 ENCOUNTER — Other Ambulatory Visit: Payer: Self-pay | Admitting: Nurse Practitioner

## 2014-08-02 DIAGNOSIS — F329 Major depressive disorder, single episode, unspecified: Secondary | ICD-10-CM

## 2014-08-02 DIAGNOSIS — F32A Depression, unspecified: Secondary | ICD-10-CM

## 2014-08-02 MED ORDER — FLUOXETINE HCL 20 MG PO CAPS
20.0000 mg | ORAL_CAPSULE | Freq: Every day | ORAL | Status: DC
Start: 2014-08-02 — End: 2015-02-23

## 2014-08-07 ENCOUNTER — Other Ambulatory Visit: Payer: Self-pay | Admitting: Nurse Practitioner

## 2014-08-07 MED ORDER — ONETOUCH DELICA LANCETS FINE MISC: Status: DC

## 2014-08-07 MED ORDER — GLUCOSE BLOOD VI STRP
ORAL_STRIP | Status: DC
Start: 2014-08-07 — End: 2015-01-29

## 2014-08-07 NOTE — Telephone Encounter (Signed)
Layne can you fill in the blanks for the test strips, specify how many times she should test, and diagnosis codes.

## 2014-08-07 NOTE — Telephone Encounter (Signed)
Patient needs Lancets & strips to be sent to [facility]. Please make sure that is her default pharmacy.

## 2014-09-22 ENCOUNTER — Other Ambulatory Visit: Payer: Self-pay | Admitting: *Deleted

## 2014-09-22 DIAGNOSIS — I10 Essential (primary) hypertension: Secondary | ICD-10-CM

## 2014-09-22 MED ORDER — LOSARTAN POTASSIUM 100 MG PO TABS: 100.0000 mg | ORAL_TABLET | Freq: Every day | ORAL | Status: DC

## 2014-10-21 ENCOUNTER — Other Ambulatory Visit: Payer: Self-pay | Admitting: Physician Assistant

## 2014-10-24 ENCOUNTER — Other Ambulatory Visit: Payer: Self-pay | Admitting: *Deleted

## 2014-10-24 ENCOUNTER — Other Ambulatory Visit: Payer: Self-pay | Admitting: Physician Assistant

## 2014-10-24 DIAGNOSIS — F329 Major depressive disorder, single episode, unspecified: Secondary | ICD-10-CM

## 2014-10-24 DIAGNOSIS — F32A Depression, unspecified: Secondary | ICD-10-CM

## 2014-10-24 MED ORDER — LORAZEPAM 1 MG PO TABS: ORAL_TABLET | ORAL | Status: DC

## 2014-10-24 NOTE — Telephone Encounter (Signed)
Refill request for  Last filled by MD on- Last Appt: 07/27/2014 Next Appt: 10/27/2014 Please advise refill?

## 2014-10-26 ENCOUNTER — Telehealth: Payer: Self-pay | Admitting: Nurse Practitioner

## 2014-10-26 DIAGNOSIS — E1129 Type 2 diabetes mellitus with other diabetic kidney complication: Secondary | ICD-10-CM

## 2014-10-26 MED ORDER — PIOGLITAZONE HCL 30 MG PO TABS: 30.0000 mg | ORAL_TABLET | Freq: Every day | ORAL | Status: DC

## 2014-10-26 NOTE — Telephone Encounter (Signed)
Pt. Called while at Pharmacy and needs to get a refill on her Actoz.  [facility]

## 2014-10-27 ENCOUNTER — Ambulatory Visit (INDEPENDENT_AMBULATORY_CARE_PROVIDER_SITE_OTHER): Payer: Medicare Other | Admitting: Nurse Practitioner

## 2014-10-27 ENCOUNTER — Encounter: Payer: Self-pay | Admitting: Nurse Practitioner

## 2014-10-27 VITALS — BP 148/84 | HR 72 | Temp 97.8°F | Ht 62.5 in | Wt 158.0 lb

## 2014-10-27 DIAGNOSIS — Z23 Encounter for immunization: Secondary | ICD-10-CM

## 2014-10-27 DIAGNOSIS — R1031 Right lower quadrant pain: Secondary | ICD-10-CM | POA: Diagnosis not present

## 2014-10-27 DIAGNOSIS — E785 Hyperlipidemia, unspecified: Secondary | ICD-10-CM

## 2014-10-27 DIAGNOSIS — I10 Essential (primary) hypertension: Secondary | ICD-10-CM

## 2014-10-27 DIAGNOSIS — E119 Type 2 diabetes mellitus without complications: Secondary | ICD-10-CM

## 2014-10-27 DIAGNOSIS — M1000 Idiopathic gout, unspecified site: Secondary | ICD-10-CM

## 2014-10-27 DIAGNOSIS — R5383 Other fatigue: Secondary | ICD-10-CM

## 2014-10-27 DIAGNOSIS — G8929 Other chronic pain: Secondary | ICD-10-CM

## 2014-10-27 DIAGNOSIS — M533 Sacrococcygeal disorders, not elsewhere classified: Secondary | ICD-10-CM

## 2014-10-27 DIAGNOSIS — K219 Gastro-esophageal reflux disease without esophagitis: Secondary | ICD-10-CM

## 2014-10-27 DIAGNOSIS — E039 Hypothyroidism, unspecified: Secondary | ICD-10-CM | POA: Diagnosis not present

## 2014-10-27 DIAGNOSIS — E559 Vitamin D deficiency, unspecified: Secondary | ICD-10-CM

## 2014-10-27 DIAGNOSIS — F32A Depression, unspecified: Secondary | ICD-10-CM

## 2014-10-27 DIAGNOSIS — F329 Major depressive disorder, single episode, unspecified: Secondary | ICD-10-CM

## 2014-10-27 MED ORDER — LOSARTAN POTASSIUM 100 MG PO TABS
100.0000 mg | ORAL_TABLET | Freq: Every day | ORAL | Status: DC
Start: 2014-10-27 — End: 2015-02-23

## 2014-10-27 MED ORDER — RANITIDINE HCL 150 MG PO TABS: 150.0000 mg | ORAL_TABLET | Freq: Two times a day (BID) | ORAL | Status: DC

## 2014-10-27 MED ORDER — TRAMADOL HCL 50 MG PO TABS: 50.0000 mg | ORAL_TABLET | Freq: Two times a day (BID) | ORAL | Status: DC | PRN

## 2014-10-27 NOTE — Assessment & Plan Note (Signed)
GFR slight improve 42 from 38 Continue actos A1C today, lipids Stopped statin while on colchicine (off for 4 mos) F/u 3 mos

## 2014-10-27 NOTE — Patient Instructions (Signed)
Get fasting labs at [place] [facility] at [facility]. My office will call with results.  Get back to exercise-it helps with energy, depression, & blood sugar.  Get mammogram done.  See me in 4 weeks for reflux.  Nice to see you!

## 2014-10-27 NOTE — Assessment & Plan Note (Signed)
Continue current meds CMET today Pt says L leg better since started norvasc?

## 2014-10-27 NOTE — Assessment & Plan Note (Signed)
TSH today Adjust synthroid PRN Continues to c/o fatigue

## 2014-10-27 NOTE — Assessment & Plan Note (Signed)
Tramadol helps Continue PRN

## 2014-10-27 NOTE — Assessment & Plan Note (Signed)
Lipids today Will restart crestor PRN labs, unless she needs to restart colchicine

## 2014-10-27 NOTE — Assessment & Plan Note (Signed)
Pt refused abd [place] at last OV. Tender on exam today Hx diverticulitis Abd [place]

## 2014-10-27 NOTE — Progress Notes (Signed)
Subjective:     Elizabeth Mcintyre is a 74 y.o. female presents to f/u chronic conditions: HTN, Hypothyroidism, gout, Depression, Type 2 diabetes mellitus, R back pain, vit d def, Dyslipidemia. She is also c/o new onset water brash and ongoing fatigue. In review of vaccines, she needs pneumococcal vaccine . HTN: controlled, added norvasc at last OV-she just started 10 days ago. Says it helps L leg pain at night. no intolerable SE meds. Not exercising. PX:TGGYI 1.3, DM, age Hypothyroidism: diagnosed 8 mos ago. TSH stable, denies diarrhea, constipation, palpitations. Continues to c/o fatigue. Asks if needs to take synthroid every day. Explained importance. Gout: stopped colchicine in Dec. Had brief mild pain in R great toe several weeks ago. Still taking allopurinol. No SE. Last uric acid was 5.0. Pain she refers to could be OA overlap. Depression: fluox 20 mg. C/o ongoing fatigue. Meeting ADLS, enjoys family. No dose change. Type 2 diabetes mellitus: better A1C since stopped metformin & started actos. Last A1C 6.5. Creat improved slightly 1.3 from 1.4. No SE. Had 1 FBS 170 during christmas. Gained 3 lbs since last OV. R SI pain: pain better with tramadol. Feels most when standing, lasts few minutes. Vit d Def: taking supplement, no SE. Lipids: high triglycerides. Made diet changes. Stopped crestor 10/15 while on colchicicne. Water brash: having episodes of epigastric pain "that takes breath, catches me". Lasts few seconds. Had "liquid come up into mouth" few nights ago. Associated w/mild nausea.Took zantac in past-stopped. Will restart.   Fatigue: ongoing. Not exercising. Possibly due to depression, but fluoxetine doesn't help, Treating thyroid has not helped. B complex does not help.  The following portions of the patient's history were reviewed and updated as appropriate: allergies, current medications, past medical history, past social history, past surgical history and problem list.  Review of  Systems Constitutional: negative for fevers Eyes: negative, due for eye exam Respiratory: negative for cough Cardiovascular: negative for chest pressure/discomfort, irregular heart beat and lower extremity edema Gastrointestinal: negative for change in bowel habits, constipation and diarrhea Endocrine: negative for diabetic symptoms including polydipsia, polyphagia and polyuria    Objective:    BP 148/84 mmHg  Pulse 72  Temp(Src) 97.8 F (36.6 C) (Temporal)  Ht 5' 2.5" (1.588 m)  Wt 158 lb (71.668 kg)  BMI 28.42 kg/m2  SpO2 100% BP 148/84 mmHg  Pulse 72  Temp(Src) 97.8 F (36.6 C) (Temporal)  Ht 5' 2.5" (1.588 m)  Wt 158 lb (71.668 kg)  BMI 28.42 kg/m2  SpO2 100% General appearance: alert, cooperative, appears stated age and no distress Head: Normocephalic, without obvious abnormality, atraumatic Eyes: negative findings: lids and lashes normal and conjunctivae and sclerae normal Neck: no carotid bruit, supple, symmetrical, trachea midline and thyroid not enlarged, symmetric, no tenderness/mass/nodules Lungs: clear to auscultation bilaterally Heart: regular rate and rhythm, S1, S2 normal, no murmur, click, rub or gallop Extremities: edema +1 pretibial edema R LE Pulses: 2+ and symmetric   Abdomen: no HSM, no masses, tender RLQ   Assessment:Plan    1. Essential hypertension, benign, chronic stable - losartan (COZAAR) 100 MG tablet; Take 1 tablet (100 mg total) by mouth daily.  Dispense: 30 tablet; Refill: 2 - CBC; Future - Comprehensive metabolic panel; Future  2. RLQ abdominal pain, New Found on exam - US Abdomen Complete; Future  3. Gastroesophageal reflux disease, esophagitis presence not specified, NEW - ranitidine (ZANTAC) 150 MG tablet; Take 1 tablet (150 mg total) by mouth 2 (two) times daily.  Dispense: 60 tablet;  Refill: 3  4. Hypothyroidism, unspecified hypothyroidism type, stable - TSH; Future  5. Vitamin D deficiency - Vit D  25 hydroxy (rtn  osteoporosis monitoring); Future  6. Acute idiopathic gout, unspecified site, unstable - Comprehensive metabolic panel; Future - Uric acid; Future  7. Type 2 diabetes mellitus without complication, stable - Lipid panel; Future - Hemoglobin A1c; Future  8. Other fatigue, chronic - Vitamin B12; Future  9. 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 Take 1T po qhs X 3d, then take 1 T po bid  Dispense: 60 tablet; Refill: 1  11. Depression, chronic  12. Dyslipidemia, chronic  See problem list for complete A&P See pt instructions. F/u 4 wks-reflux (zantac)  Spent 25 Minutes with patient, 50% or more was spent in counseling.

## 2014-10-27 NOTE — Progress Notes (Signed)
Pre visit review using our clinic review tool, if applicable. No additional management support is needed unless otherwise documented below in the visit note. 

## 2014-11-02 ENCOUNTER — Other Ambulatory Visit: Payer: Self-pay | Admitting: Nurse Practitioner

## 2014-11-02 ENCOUNTER — Ambulatory Visit (HOSPITAL_COMMUNITY)
Admission: RE | Admit: 2014-11-02 | Discharge: 2014-11-02 | Disposition: A | Discharge status: Home / Self Care | Payer: Medicare Other | Source: Ambulatory Visit | Attending: Nurse Practitioner | Admitting: Nurse Practitioner

## 2014-11-02 ENCOUNTER — Telehealth: Payer: Self-pay | Admitting: Nurse Practitioner

## 2014-11-02 DIAGNOSIS — I10 Essential (primary) hypertension: Secondary | ICD-10-CM | POA: Diagnosis not present

## 2014-11-02 DIAGNOSIS — E559 Vitamin D deficiency, unspecified: Secondary | ICD-10-CM | POA: Diagnosis not present

## 2014-11-02 DIAGNOSIS — E039 Hypothyroidism, unspecified: Secondary | ICD-10-CM | POA: Diagnosis not present

## 2014-11-02 DIAGNOSIS — E119 Type 2 diabetes mellitus without complications: Secondary | ICD-10-CM | POA: Diagnosis not present

## 2014-11-02 DIAGNOSIS — R1031 Right lower quadrant pain: Secondary | ICD-10-CM | POA: Diagnosis not present

## 2014-11-02 DIAGNOSIS — M1000 Idiopathic gout, unspecified site: Secondary | ICD-10-CM | POA: Diagnosis not present

## 2014-11-02 DIAGNOSIS — R10813 Right lower quadrant abdominal tenderness: Secondary | ICD-10-CM | POA: Diagnosis not present

## 2014-11-02 LAB — CBC
HEMATOCRIT: 40.1 % (ref 36.0–46.0)
Hemoglobin: 13.5 g/dL (ref 12.0–15.0)
MCH: 29.2 pg (ref 26.0–34.0)
MCHC: 33.7 g/dL (ref 30.0–36.0)
MCV: 86.8 fL (ref 78.0–100.0)
MPV: 9.6 fL (ref 8.6–12.4)
Platelets: 194 10*3/uL (ref 150–400)
RBC: 4.62 MIL/uL (ref 3.87–5.11)
RDW: 14.6 % (ref 11.5–15.5)
WBC: 6.2 10*3/uL (ref 4.0–10.5)

## 2014-11-02 NOTE — Telephone Encounter (Signed)
LMOVM for pt to return call 

## 2014-11-02 NOTE — Telephone Encounter (Signed)
pls call pt: Advise abd [place] nml. No explanation for tender abdomen.  Her last colonoscopy showed diverticulosis-perhaps tenderness is due to flare-liquid diet for 2-3 days often helps.  Please let me know if abdomen is persistently tender.

## 2014-11-02 NOTE — Telephone Encounter (Signed)
Patient returned call and was given results. Patient expressed understanding. 

## 2014-11-03 ENCOUNTER — Telehealth: Payer: Self-pay | Admitting: Nurse Practitioner

## 2014-11-03 DIAGNOSIS — E785 Hyperlipidemia, unspecified: Secondary | ICD-10-CM

## 2014-11-03 DIAGNOSIS — E1129 Type 2 diabetes mellitus with other diabetic kidney complication: Secondary | ICD-10-CM

## 2014-11-03 LAB — LIPID PANEL
Cholesterol: 287 mg/dL — ABNORMAL HIGH (ref 0–200)
HDL: 52 mg/dL (ref >39)
LDL Cholesterol: 176 mg/dL — ABNORMAL HIGH (ref 0–99)
Total CHOL/HDL Ratio: 5.5 Ratio
Triglycerides: 296 mg/dL — ABNORMAL HIGH (ref <150)
VLDL: 59 mg/dL — ABNORMAL HIGH (ref 0–40)

## 2014-11-03 LAB — COMPREHENSIVE METABOLIC PANEL
ALK PHOS: 59 U/L (ref 39–117)
ALT: 18 U/L (ref 0–35)
AST: 20 U/L (ref 0–37)
Albumin: 4.4 g/dL (ref 3.5–5.2)
BUN: 20 mg/dL (ref 6–23)
CO2: 26 mEq/L (ref 19–32)
CREATININE: 1.29 mg/dL — AB (ref 0.50–1.10)
Calcium: 9.6 mg/dL (ref 8.4–10.5)
Chloride: 103 mEq/L (ref 96–112)
Glucose, Bld: 109 mg/dL — ABNORMAL HIGH (ref 70–99)
POTASSIUM: 4.4 meq/L (ref 3.5–5.3)
Sodium: 139 mEq/L (ref 135–145)
Total Bilirubin: 0.5 mg/dL (ref 0.2–1.2)
Total Protein: 7 g/dL (ref 6.0–8.3)

## 2014-11-03 LAB — HEMOGLOBIN A1C
HEMOGLOBIN A1C: 5.8 % — AB (ref <5.7)
MEAN PLASMA GLUCOSE: 120 mg/dL — AB (ref <117)

## 2014-11-03 LAB — VITAMIN B12: Vitamin B-12: 1084 pg/mL — ABNORMAL HIGH (ref 211–911)

## 2014-11-03 LAB — TSH: TSH: 2.915 u[IU]/mL (ref 0.350–4.500)

## 2014-11-03 LAB — URIC ACID: Uric Acid, Serum: 4.7 mg/dL (ref 2.4–7.0)

## 2014-11-03 LAB — VITAMIN D 25 HYDROXY (VIT D DEFICIENCY, FRACTURES): VIT D 25 HYDROXY: 29 ng/mL — AB (ref 30–100)

## 2014-11-03 MED ORDER — ROSUVASTATIN CALCIUM 40 MG PO TABS
40.0000 mg | ORAL_TABLET | Freq: Every day | ORAL | Status: DC
Start: 2014-11-03 — End: 2015-12-07

## 2014-11-03 MED ORDER — PIOGLITAZONE HCL 15 MG PO TABS: 15.0000 mg | ORAL_TABLET | Freq: Every day | ORAL | Status: DC

## 2014-11-03 NOTE — Telephone Encounter (Signed)
Patient notified of results. Patient expressed understanding.  

## 2014-11-03 NOTE — Telephone Encounter (Signed)
pls call pt: Advise Labs have really improved! A1C is 5.8-hasn't been that low in years. Goal is under 6.0. I am decreasing actos to 15 mg qd. Triglycerides have improved! This is due to diet changes! Restart crestor. No other changes. See her in 1 mo.

## 2014-11-15 ENCOUNTER — Other Ambulatory Visit: Payer: Self-pay | Admitting: Nurse Practitioner

## 2014-11-24 ENCOUNTER — Ambulatory Visit (INDEPENDENT_AMBULATORY_CARE_PROVIDER_SITE_OTHER): Payer: Medicare Other | Admitting: Nurse Practitioner

## 2014-11-24 ENCOUNTER — Encounter: Payer: Self-pay | Admitting: Nurse Practitioner

## 2014-11-24 VITALS — BP 135/81 | HR 71 | Temp 98.6°F | Ht 62.5 in | Wt 157.0 lb

## 2014-11-24 DIAGNOSIS — E1122 Type 2 diabetes mellitus with diabetic chronic kidney disease: Secondary | ICD-10-CM

## 2014-11-24 DIAGNOSIS — E785 Hyperlipidemia, unspecified: Secondary | ICD-10-CM

## 2014-11-24 DIAGNOSIS — M1000 Idiopathic gout, unspecified site: Secondary | ICD-10-CM | POA: Diagnosis not present

## 2014-11-24 DIAGNOSIS — N189 Chronic kidney disease, unspecified: Secondary | ICD-10-CM | POA: Diagnosis not present

## 2014-11-24 MED ORDER — PREDNISONE 10 MG PO TABS: ORAL_TABLET | ORAL | Status: DC

## 2014-11-24 NOTE — Patient Instructions (Signed)
Use prednisone prescription when having gout flare.  Continue allopurinol at same dose.  Continue Actos at 15 mg daily.  I will see you in 3 mos.

## 2014-11-24 NOTE — Progress Notes (Signed)
Pre visit review using our clinic review tool, if applicable. No additional management support is needed unless otherwise documented below in the visit note. 

## 2014-11-24 NOTE — Assessment & Plan Note (Signed)
Restarted 40 mg crestor 4 weeks ago Continue F/u 3 mos for labs

## 2014-11-24 NOTE — Progress Notes (Signed)
Subjective:     Elizabeth Mcintyre is a 74 y.o. female presents for f/u DM, lipids, & c/o recent gout flare. DM: decreased actos to 15 mg from 30 mg 1 mo ago. Home readings: "highest reading 126" (fasting). Feels well. RF: lipids, HTN, BMI 28. Last A1C improved to 5.8 from 6.5. She was switched from metformin to actos Due to elevated creat.  Creat improved to 1.29 from 1.4. She has also made diet changes. Lipids: stopped statin for 4 mos while on colchicine. Restarted crestor 40 mg 1 mo ago. Tolerating w/out SE. RF: DM, HTN, BMI 28. Gout: not crystal proven. recent flare same foot-R great toe & "ball of foot". Pain improved with rest. Foot was red & tender to touch. Pain worse w/wt-bearing. She remains on allopurinol. Last uric acid was 4.7. Not sure this is gout. Pt declines ref to ortho/rheum for eval. Discussed having PRN prescription for prednisone if pain flares again. Remain off HCTZ. Stay on losartan for best management if it is gout..  The following portions of the patient's history were reviewed and updated as appropriate: allergies, current medications, past medical history, past social history, past surgical history and problem list.  Review of Systems Constitutional: negative for fatigue and fevers Respiratory: negative for cough Cardiovascular: negative for irregular heart beat Gastrointestinal: negative for reflux symptoms and improvement since starting zantac Neurological: negative for paresthesia Behavioral/Psych: negative for sleep disturbance    Objective:    BP 135/81 mmHg  Pulse 71  Temp(Src) 98.6 F (37 C) (Temporal)  Ht 5' 2.5" (1.588 m)  Wt 157 lb (71.215 kg)  BMI 28.24 kg/m2  SpO2 99% BP 135/81 mmHg  Pulse 71  Temp(Src) 98.6 F (37 C) (Temporal)  Ht 5' 2.5" (1.588 m)  Wt 157 lb (71.215 kg)  BMI 28.24 kg/m2  SpO2 99% General appearance: alert, cooperative, appears stated age and no distress Head: Normocephalic, without obvious abnormality, atraumatic Eyes:  negative findings: lids and lashes normal and conjunctivae and sclerae normal Lungs: clear to auscultation bilaterally Heart: regular rate and rhythm, S1, S2 normal, no murmur, click, rub or gallop Neurologic: Grossly normal   MSK: R foot- no erythema or warmth. Tender to palpation 1st MTP  Assessment:Plan    1. Acute idiopathic gout, unspecified site Use pred script PRN flare Continue allopurinol Uric acid next OV - predniSONE (DELTASONE) 10 MG tablet; Take 2 T po qam X 1wk, then 1 T po qd X 1 week, then 1/2T PO X 1wk  Dispense: 28 tablet; Refill: 0  2. Type 2 diabetes mellitus with diabetic chronic kidney disease Continue actos 15 mg qd, diet changes, daily walk A1c, CMET next OV 3. Dyslipidemia Continue crestor Check lipids next OV   F/u 3 mos: lipids, cmet, a1c, uric acid, Vit d if taking supplement Ask about reflux/zantac

## 2014-11-24 NOTE — Assessment & Plan Note (Signed)
Continue actos Exercise Diet changes F/u 3 mos

## 2014-11-24 NOTE — Assessment & Plan Note (Signed)
Continue allopurinol at 100 mg qd Last uric acid 4.7 PRN script for prednisone if another flare Consider xray foot if continues to have pain while uric acid is low

## 2014-12-29 ENCOUNTER — Other Ambulatory Visit: Payer: Self-pay

## 2014-12-29 DIAGNOSIS — I10 Essential (primary) hypertension: Secondary | ICD-10-CM

## 2014-12-29 MED ORDER — AMLODIPINE BESYLATE 5 MG PO TABS: 5.0000 mg | ORAL_TABLET | Freq: Every day | ORAL | Status: DC

## 2014-12-29 NOTE — Telephone Encounter (Signed)
norvasc sent. Pls sched OV for mid-June or after.

## 2014-12-29 NOTE — Telephone Encounter (Signed)
Please Advise Refill Request? Refill request for- Amlodipine Besylate 5mg  tab Last filled by MD on - 08/30/14 Last Appt - 11/24/14        Next Appt - none scheduled [facility]

## 2015-01-27 ENCOUNTER — Emergency Department (HOSPITAL_COMMUNITY): Payer: Medicare Other

## 2015-01-27 ENCOUNTER — Other Ambulatory Visit (HOSPITAL_COMMUNITY): Payer: Self-pay

## 2015-01-27 ENCOUNTER — Inpatient Hospital Stay (HOSPITAL_COMMUNITY)
Admission: EM | Admit: 2015-01-27 | Discharge: 2015-01-29 | DRG: 382 | Disposition: A | Discharge status: Home / Self Care | Payer: Medicare Other | Attending: Internal Medicine | Admitting: Internal Medicine

## 2015-01-27 ENCOUNTER — Encounter (HOSPITAL_COMMUNITY): Payer: Self-pay | Admitting: Emergency Medicine

## 2015-01-27 DIAGNOSIS — E039 Hypothyroidism, unspecified: Secondary | ICD-10-CM | POA: Diagnosis not present

## 2015-01-27 DIAGNOSIS — E1122 Type 2 diabetes mellitus with diabetic chronic kidney disease: Secondary | ICD-10-CM | POA: Diagnosis not present

## 2015-01-27 DIAGNOSIS — J45909 Unspecified asthma, uncomplicated: Secondary | ICD-10-CM | POA: Diagnosis present

## 2015-01-27 DIAGNOSIS — Z833 Family history of diabetes mellitus: Secondary | ICD-10-CM

## 2015-01-27 DIAGNOSIS — R0789 Other chest pain: Secondary | ICD-10-CM | POA: Diagnosis not present

## 2015-01-27 DIAGNOSIS — K222 Esophageal obstruction: Secondary | ICD-10-CM | POA: Diagnosis not present

## 2015-01-27 DIAGNOSIS — K229 Disease of esophagus, unspecified: Secondary | ICD-10-CM | POA: Diagnosis not present

## 2015-01-27 DIAGNOSIS — E1129 Type 2 diabetes mellitus with other diabetic kidney complication: Secondary | ICD-10-CM | POA: Diagnosis not present

## 2015-01-27 DIAGNOSIS — K2211 Ulcer of esophagus with bleeding: Principal | ICD-10-CM | POA: Diagnosis present

## 2015-01-27 DIAGNOSIS — T380X5A Adverse effect of glucocorticoids and synthetic analogues, initial encounter: Secondary | ICD-10-CM | POA: Diagnosis present

## 2015-01-27 DIAGNOSIS — K219 Gastro-esophageal reflux disease without esophagitis: Secondary | ICD-10-CM | POA: Diagnosis not present

## 2015-01-27 DIAGNOSIS — Z8249 Family history of ischemic heart disease and other diseases of the circulatory system: Secondary | ICD-10-CM | POA: Diagnosis not present

## 2015-01-27 DIAGNOSIS — K922 Gastrointestinal hemorrhage, unspecified: Secondary | ICD-10-CM | POA: Diagnosis present

## 2015-01-27 DIAGNOSIS — N189 Chronic kidney disease, unspecified: Secondary | ICD-10-CM | POA: Diagnosis not present

## 2015-01-27 DIAGNOSIS — R110 Nausea: Secondary | ICD-10-CM | POA: Diagnosis not present

## 2015-01-27 DIAGNOSIS — K2289 Other specified disease of esophagus: Secondary | ICD-10-CM | POA: Insufficient documentation

## 2015-01-27 DIAGNOSIS — K920 Hematemesis: Secondary | ICD-10-CM

## 2015-01-27 DIAGNOSIS — R079 Chest pain, unspecified: Secondary | ICD-10-CM | POA: Diagnosis not present

## 2015-01-27 DIAGNOSIS — K210 Gastro-esophageal reflux disease with esophagitis: Secondary | ICD-10-CM | POA: Diagnosis present

## 2015-01-27 DIAGNOSIS — E785 Hyperlipidemia, unspecified: Secondary | ICD-10-CM | POA: Diagnosis present

## 2015-01-27 DIAGNOSIS — Z7952 Long term (current) use of systemic steroids: Secondary | ICD-10-CM

## 2015-01-27 DIAGNOSIS — Z7982 Long term (current) use of aspirin: Secondary | ICD-10-CM | POA: Diagnosis not present

## 2015-01-27 DIAGNOSIS — I10 Essential (primary) hypertension: Secondary | ICD-10-CM | POA: Diagnosis not present

## 2015-01-27 LAB — CBC WITH DIFFERENTIAL/PLATELET
Basophils Absolute: 0 10*3/uL (ref 0.0–0.1)
Basophils Relative: 0 % (ref 0–1)
Eosinophils Absolute: 0.2 10*3/uL (ref 0.0–0.7)
Eosinophils Relative: 2 % (ref 0–5)
HCT: 41 % (ref 36.0–46.0)
Hemoglobin: 13.7 g/dL (ref 12.0–15.0)
Lymphocytes Relative: 34 % (ref 12–46)
Lymphs Abs: 3.6 10*3/uL (ref 0.7–4.0)
MCH: 29.2 pg (ref 26.0–34.0)
MCHC: 33.4 g/dL (ref 30.0–36.0)
MCV: 87.4 fL (ref 78.0–100.0)
Monocytes Absolute: 0.9 10*3/uL (ref 0.1–1.0)
Monocytes Relative: 9 % (ref 3–12)
NEUTROS PCT: 55 % (ref 43–77)
Neutro Abs: 5.7 10*3/uL (ref 1.7–7.7)
PLATELETS: 226 10*3/uL (ref 150–400)
RBC: 4.69 MIL/uL (ref 3.87–5.11)
RDW: 14 % (ref 11.5–15.5)
WBC: 10.4 10*3/uL (ref 4.0–10.5)

## 2015-01-27 LAB — BASIC METABOLIC PANEL
Anion gap: 12 (ref 5–15)
BUN: 31 mg/dL — AB (ref 6–20)
CALCIUM: 9.8 mg/dL (ref 8.9–10.3)
CHLORIDE: 99 mmol/L — AB (ref 101–111)
CO2: 26 mmol/L (ref 22–32)
Creatinine, Ser: 1.33 mg/dL — ABNORMAL HIGH (ref 0.44–1.00)
GFR calc Af Amer: 45 mL/min — ABNORMAL LOW (ref >60)
GFR calc non Af Amer: 39 mL/min — ABNORMAL LOW (ref >60)
GLUCOSE: 101 mg/dL — AB (ref 65–99)
Potassium: 3.7 mmol/L (ref 3.5–5.1)
Sodium: 137 mmol/L (ref 135–145)

## 2015-01-27 LAB — TROPONIN I
Troponin I: <0.03 ng/mL (ref <0.031)
Troponin I: <0.03 ng/mL (ref <0.031)

## 2015-01-27 IMAGING — CR DG CHEST 1V PORT
1 series · 1 of 1 positions shown · non-contrast
Comparison: [DATE]

CLINICAL DATA: Chest pain, neck and face weakness and numbness.

EXAM:
PORTABLE CHEST - 1 VIEW

[ap portable]
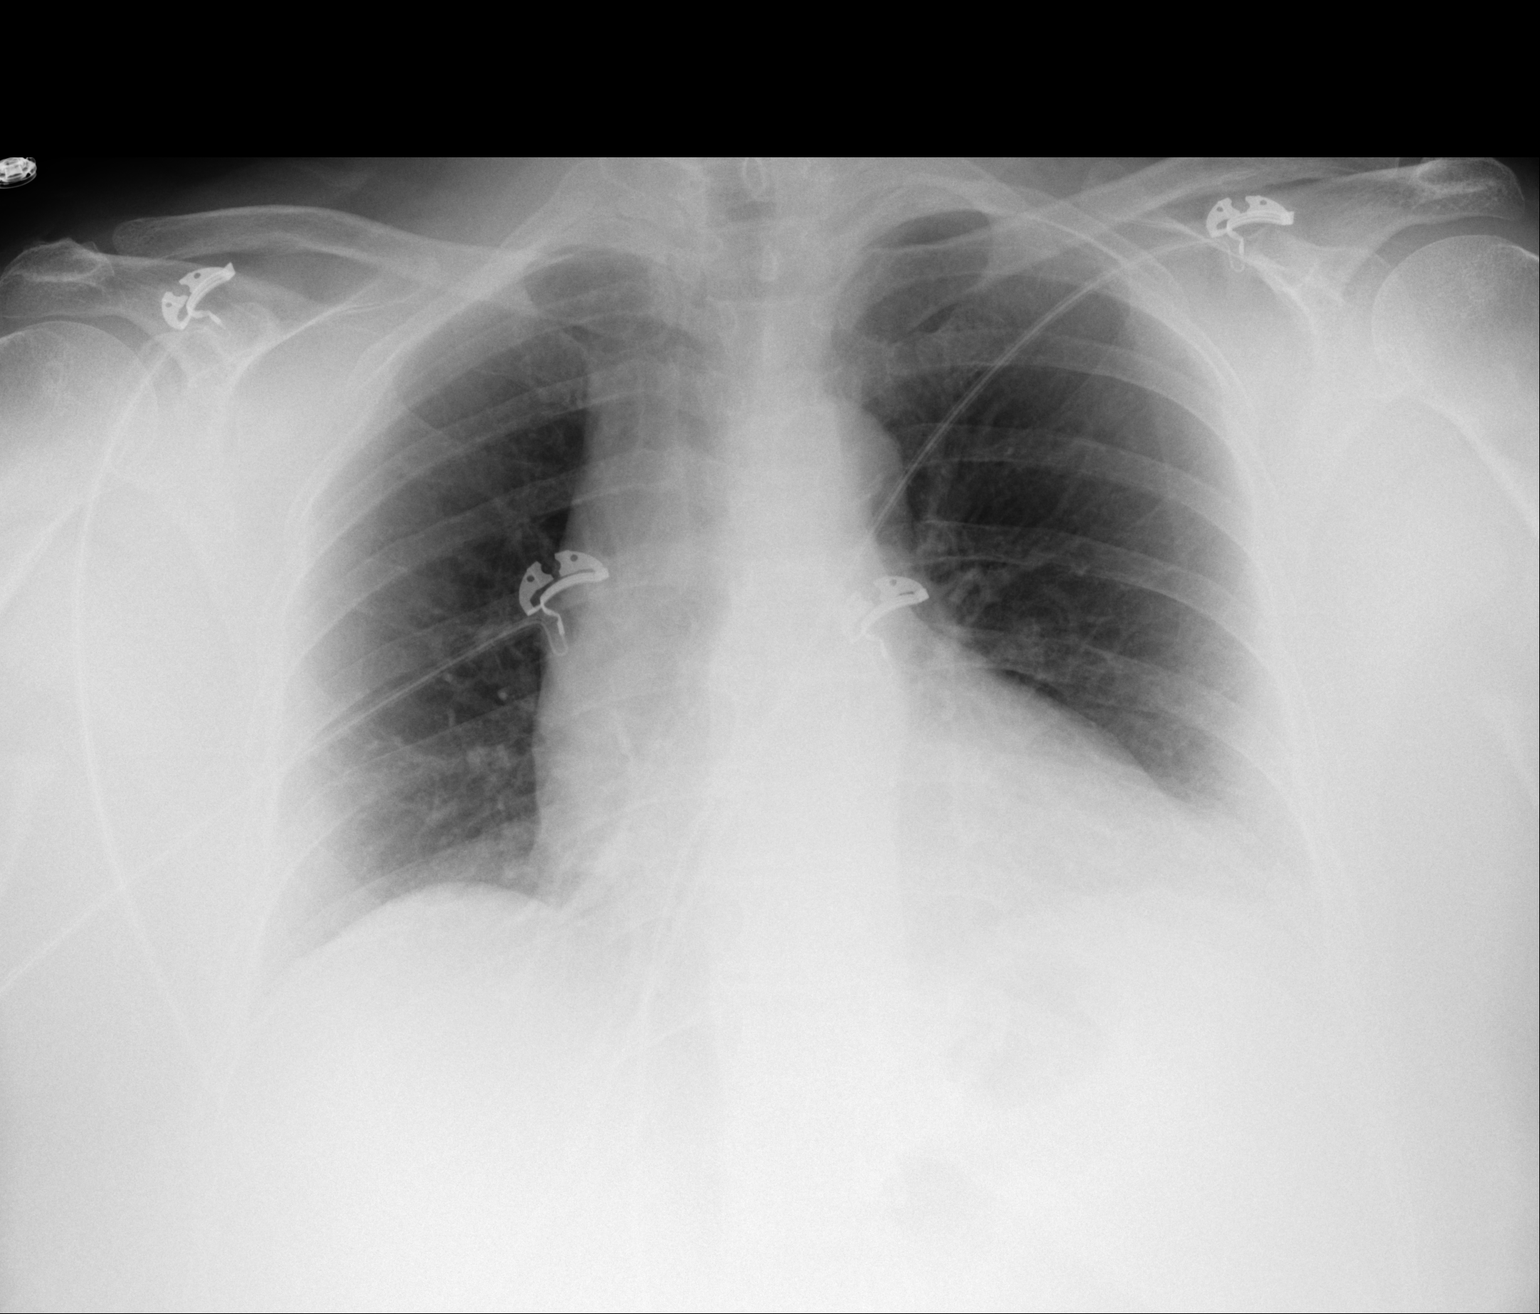

[1 of 1 positions shown; findings below may reference images not displayed]

FINDINGS: The heart size and mediastinal contours are stable. The heart size
is enlarged. There is no focal infiltrate, pulmonary edema, or
pleural effusion. The visualized skeletal structures are stable.
IMPRESSION: No active cardiopulmonary disease.

## 2015-01-27 MED ORDER — SODIUM CHLORIDE 0.9 % IV SOLN
80.0000 mg | Freq: Once | INTRAVENOUS | Status: AC
  Administered 2015-01-27: 80 mg via INTRAVENOUS
  Filled 2015-01-27: qty 80

## 2015-01-27 MED ORDER — ROSUVASTATIN CALCIUM 20 MG PO TABS
40.0000 mg | ORAL_TABLET | Freq: Every day | ORAL | Status: DC
  Administered 2015-01-27: 40 mg via ORAL
  Filled 2015-01-27 (×2): qty 2

## 2015-01-27 MED ORDER — PANTOPRAZOLE SODIUM 40 MG IV SOLR
40.0000 mg | Freq: Two times a day (BID) | INTRAVENOUS | Status: DC
Start: 2015-01-31 — End: 2015-01-28

## 2015-01-27 MED ORDER — LEVOTHYROXINE SODIUM 25 MCG PO TABS
25.0000 ug | ORAL_TABLET | Freq: Every day | ORAL | Status: DC
  Administered 2015-01-28 – 2015-01-29 (×2): 25 ug via ORAL
  Filled 2015-01-27 (×2): qty 1

## 2015-01-27 MED ORDER — ACETAMINOPHEN 325 MG PO TABS
650.0000 mg | ORAL_TABLET | Freq: Four times a day (QID) | ORAL | Status: DC | PRN
  Administered 2015-01-27: 650 mg via ORAL
  Filled 2015-01-27: qty 2

## 2015-01-27 MED ORDER — INSULIN ASPART 100 UNIT/ML ~~LOC~~ SOLN
0.0000 [IU] | Freq: Four times a day (QID) | SUBCUTANEOUS | Status: DC
  Administered 2015-01-28: 2 [IU] via SUBCUTANEOUS

## 2015-01-27 MED ORDER — SODIUM CHLORIDE 0.9 % IV SOLN
8.0000 mg/h | INTRAVENOUS | Status: DC
  Administered 2015-01-27 – 2015-01-28 (×2): 8 mg/h via INTRAVENOUS
  Filled 2015-01-27 (×5): qty 80

## 2015-01-27 MED ORDER — SUCRALFATE 1 GM/10ML PO SUSP
1.0000 g | Freq: Three times a day (TID) | ORAL | Status: DC
  Administered 2015-01-27 – 2015-01-29 (×5): 1 g via ORAL
  Filled 2015-01-27 (×6): qty 10

## 2015-01-27 MED ORDER — ONDANSETRON HCL 4 MG PO TABS: 4.0000 mg | ORAL_TABLET | Freq: Four times a day (QID) | ORAL | Status: DC | PRN

## 2015-01-27 MED ORDER — ONDANSETRON HCL 4 MG/2ML IJ SOLN
INTRAMUSCULAR | Status: AC
  Administered 2015-01-27: 4 mg
  Filled 2015-01-27: qty 2

## 2015-01-27 MED ORDER — SODIUM CHLORIDE 0.9 % IV SOLN
INTRAVENOUS | Status: DC
  Administered 2015-01-27 – 2015-01-28 (×2): via INTRAVENOUS

## 2015-01-27 MED ORDER — AMLODIPINE BESYLATE 5 MG PO TABS
5.0000 mg | ORAL_TABLET | Freq: Every day | ORAL | Status: DC
  Administered 2015-01-28 – 2015-01-29 (×2): 5 mg via ORAL
  Filled 2015-01-27 (×2): qty 1

## 2015-01-27 MED ORDER — FLUOXETINE HCL 20 MG PO CAPS
20.0000 mg | ORAL_CAPSULE | Freq: Every day | ORAL | Status: DC
  Administered 2015-01-28 – 2015-01-29 (×2): 20 mg via ORAL
  Filled 2015-01-27 (×2): qty 1

## 2015-01-27 MED ORDER — ACETAMINOPHEN 650 MG RE SUPP
650.0000 mg | Freq: Four times a day (QID) | RECTAL | Status: DC | PRN
Start: 2015-01-27 — End: 2015-01-29

## 2015-01-27 MED ORDER — FLUTICASONE PROPIONATE 50 MCG/ACT NA SUSP: 1.0000 | Freq: Every day | NASAL | Status: DC | PRN

## 2015-01-27 MED ORDER — ALUM & MAG HYDROXIDE-SIMETH 200-200-20 MG/5ML PO SUSP
30.0000 mL | Freq: Four times a day (QID) | ORAL | Status: DC | PRN
  Administered 2015-01-28: 30 mL via ORAL
  Filled 2015-01-27: qty 30

## 2015-01-27 MED ORDER — PANTOPRAZOLE SODIUM 40 MG IV SOLR
INTRAVENOUS | Status: AC
  Filled 2015-01-27: qty 160

## 2015-01-27 MED ORDER — GI COCKTAIL ~~LOC~~
ORAL | Status: AC
  Filled 2015-01-27: qty 30

## 2015-01-27 MED ORDER — ONDANSETRON HCL 4 MG/2ML IJ SOLN
4.0000 mg | Freq: Four times a day (QID) | INTRAMUSCULAR | Status: DC | PRN
  Administered 2015-01-28: 4 mg via INTRAVENOUS
  Filled 2015-01-27: qty 2

## 2015-01-27 MED ORDER — NITROGLYCERIN 0.4 MG SL SUBL
0.4000 mg | SUBLINGUAL_TABLET | Freq: Once | SUBLINGUAL | Status: AC
  Administered 2015-01-27: 0.4 mg via SUBLINGUAL
  Filled 2015-01-27: qty 1

## 2015-01-27 MED ORDER — ALLOPURINOL 100 MG PO TABS
100.0000 mg | ORAL_TABLET | Freq: Every day | ORAL | Status: DC
  Administered 2015-01-28 – 2015-01-29 (×2): 100 mg via ORAL
  Filled 2015-01-27 (×2): qty 1

## 2015-01-27 MED ORDER — GI COCKTAIL ~~LOC~~
30.0000 mL | Freq: Once | ORAL | Status: AC
  Administered 2015-01-27: 30 mL via ORAL

## 2015-01-27 MED ORDER — SODIUM CHLORIDE 0.9 % IJ SOLN: 3.0000 mL | Freq: Two times a day (BID) | INTRAMUSCULAR | Status: DC

## 2015-01-27 MED ORDER — LOSARTAN POTASSIUM 50 MG PO TABS
100.0000 mg | ORAL_TABLET | Freq: Every day | ORAL | Status: DC
  Administered 2015-01-28 – 2015-01-29 (×2): 100 mg via ORAL
  Filled 2015-01-27 (×2): qty 2

## 2015-01-27 MED ORDER — ASPIRIN 325 MG PO TABS
325.0000 mg | ORAL_TABLET | Freq: Once | ORAL | Status: AC
  Administered 2015-01-27: 325 mg via ORAL
  Filled 2015-01-27: qty 1

## 2015-01-27 MED ORDER — PANTOPRAZOLE SODIUM 40 MG IV SOLR
INTRAVENOUS | Status: AC
  Administered 2015-01-27: 40 mg
  Filled 2015-01-27: qty 40

## 2015-01-27 NOTE — H&P (Addendum)
History and Physical  Elizabeth Mcintyre GYK:[id number] DOB: 1941-07-13 DOA: 01/27/2015  Referring physician: Dr. Lacinda Axon, ED physician PCP: Elizabeth Pap, NP   Chief Complaint: Chest discomfort, dizziness  HPI: Elizabeth Mcintyre is a 74 y.o. female  With history of type 2 diabetes, hypertension, hyperlipidemia, gout. She presents to the hospital with some mild dizziness and chest discomfort. She has been on prednisone for the past 2 weeks due to a gout flare. Her doses to 40 mg daily for the past 10 days and has decreased to 20 mg approximately 4 days ago. Her discomfort started earlier today and has worsened mildly. The middle of her workup, she had an episode of hematemesis which was witnessed by her nurse. No provoking or palliating factors. The patient had a GI cocktail and restarted which helped mildly.   Review of Systems:   Pt complains of headache after administration of nitroglycerin, epigastric tenderness.  Pt denies any fevers, chills, nausea, vomiting, diarrhea, constipation, blood in the stool, rectal bleeding, melena, vertigo.  Review of systems are otherwise negative  Past Medical History  Diagnosis Date  . Diabetes mellitus     type 2  . Hyperlipidemia   . Hypertension   . Asthma   . NECK AND BACK PAIN 11/26/2010  . HYPERTENSION 11/09/2007  . HYPERLIPIDEMIA 11/09/2007  . HIP PAIN, RIGHT 11/26/2010  . HEMATURIA UNSPECIFIED 11/26/2010  . Vaginitis 01/01/2011  . ASTHMA 11/09/2007  . ALLERGIC RHINITIS CAUSE UNSPECIFIED 11/26/2010  . DIABETES MELLITUS, TYPE II 11/09/2007  . 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 Lymphadenopathy 04/03/2011  . Diverticulosis 03/2011  . Fatigue 07/22/2011  . Atypical chest pain 07/22/2011  . Sinusitis acute 12/05/2011  . Visual disturbance of one eye 12/05/2011  . Stress incontinence   . History of rheumatoid arthritis     during 30's, was treated.  . Depression    Past Surgical History  Procedure Laterality Date  . Abdominal hysterectomy    .  Cardiac catheterization      X 2, last one in 1998  . Colonoscopy    . Upper gastrointestinal endoscopy     Social History:  reports that she has never smoked. She has never used smokeless tobacco. She reports that she does not drink alcohol or use illicit drugs. Patient lives at home & is able to participate in activities of daily living  Allergies  Allergen Reactions  . Codeine Phosphate     Family History  Problem Relation Age of Onset  . Heart disease Mother   . Osteoarthritis Mother   . Sudden death Father   . Single kidney Father   . Other Father     h/o severe MVA injuries  . Hyperlipidemia Sister   . Other Daughter     Myalgias  . Fibromyalgia Daughter   . Allergies Daughter   . Heart disease Maternal Grandfather   . Sudden death Paternal Grandmother   . Diabetes Paternal Grandfather   . Heart disease Daughter   . Other Daughter     palpitations  . Pulmonary fibrosis Maternal Aunt   . Cancer Paternal Uncle   . Pulmonary fibrosis Maternal Aunt       Prior to Admission medications   Medication Sig Start Date End Date Taking? Authorizing Provider  allopurinol (ZYLOPRIM) 100 MG tablet Take 1 tablet (100 mg total) by mouth daily. 06/23/14  Yes Elizabeth Pap, NP  amLODipine (NORVASC) 5 MG tablet Take 1 tablet (5 mg total) by mouth daily. 12/29/14  Yes Elizabeth Pap, NP  aspirin EC 81 MG tablet Take 81 mg by mouth daily.   Yes Historical Provider, MD  cholecalciferol (VITAMIN D) 1000 UNITS tablet Take 5,000 Units by mouth daily.   Yes Historical Provider, MD  FLUoxetine (PROZAC) 20 MG capsule Take 1 capsule (20 mg total) by mouth daily. 08/02/14  Yes Elizabeth Pap, NP  levothyroxine (SYNTHROID, LEVOTHROID) 25 MCG tablet Take 1 tablet (25 mcg total) by mouth daily before breakfast. 06/21/14  Yes Elizabeth Pap, NP  LORazepam (ATIVAN) 1 MG tablet Take 1/2 to 1T po bid PRN anxiety Patient taking differently: Take 0.5-1 mg by mouth 2 (two) times daily as needed for  anxiety.  10/24/14  Yes Elizabeth Pap, NP  losartan (COZAAR) 100 MG tablet Take 1 tablet (100 mg total) by mouth daily. 10/27/14  Yes Elizabeth Pap, NP  pioglitazone (ACTOS) 15 MG tablet Take 1 tablet (15 mg total) by mouth daily. Take with largest meal of day. 11/03/14  Yes Elizabeth Pap, NP  predniSONE (DELTASONE) 10 MG tablet Take 2 T po qam X 1wk, then 1 T po qd X 1 week, then 1/2T PO X 1wk 11/24/14  Yes Elizabeth Pap, NP  ranitidine (ZANTAC) 150 MG tablet Take 1 tablet (150 mg total) by mouth 2 (two) times daily. 10/27/14  Yes Elizabeth Pap, NP  rosuvastatin (CRESTOR) 40 MG tablet Take 1 tablet (40 mg total) by mouth daily. 11/03/14  Yes Elizabeth Pap, NP  vitamin B-12 (CYANOCOBALAMIN) 1000 MCG tablet Take 1,000 mcg by mouth daily.   Yes Historical Provider, MD  aspirin 81 MG tablet Take 1 tablet (81 mg total) by mouth daily. Patient not taking: Reported on 01/27/2015 12/24/12   Mosie Lukes, MD  Blood Glucose Monitoring Suppl (ONE TOUCH ULTRA SYSTEM KIT) W/DEVICE KIT 1 kit by Does not apply route once. 06/01/14   Elizabeth Pap, NP  fluticasone (FLONASE) 50 MCG/ACT nasal spray 1 spray L nare twice daily. Patient taking differently: Place 1 spray into both nostrils daily as needed for allergies.  06/23/14   Elizabeth Pap, NP  glucose blood (FREESTYLE LITE) test strip Use as instructed 08/07/14   Elizabeth Pap, NP  NON FORMULARY Accu-Chek Lancets misc- Check blood sugars bid and prn     Historical Provider, MD  NON FORMULARY Accu-Check Aviva Kit     Historical Provider, MD  NON FORMULARY BD Disp Needles 30G X 1/2" misc     Historical Provider, MD  [facility] DELICA LANCETS FINE MISC Test twice daily. 08/07/14   Elizabeth Pap, NP  traMADol (ULTRAM) 50 MG tablet Take 1 tablet (50 mg total) by mouth 2 (two) times daily as needed. Take 1T po qhs X 3d, then take 1 T po bid 10/27/14   Elizabeth Pap, NP    Physical Exam: BP 154/66 mmHg  Pulse 81  Temp(Src) 98.3 F (36.8 C) (Oral)  Resp 22  Ht 5'  2" (1.575 m)  Wt 155 lb (70.308 kg)  BMI 28.34 kg/m2  SpO2 92%  General: Elderly Caucasian female. Awake and alert and oriented x3. No acute cardiopulmonary distress.  Eyes: Pupils equal, round, reactive to light. Extraocular muscles are intact. Sclerae anicteric and noninjected.  ENT:  Moist mucosal membranes. No mucosal lesions.   Neck: Neck supple without lymphadenopathy. No carotid bruits. No masses palpated.  Cardiovascular: Regular rate with normal S1-S2 sounds. No murmurs, rubs, gallops auscultated. No JVD.  Respiratory: Good respiratory  effort with no wheezes, rales, rhonchi. Lungs clear to auscultation bilaterally.  Abdomen: Soft, mild tender in the epigastric and left upper quadrant, mild distention. Active bowel sounds. No masses or hepatosplenomegaly  Skin: Dry, warm to touch. 2+ dorsalis pedis and radial pulses. Musculoskeletal: No calf or leg pain. All major joints not erythematous nontender.  Psychiatric: Intact judgment and insight.  Neurologic: No focal neurological deficits. Cranial nerves II through XII are grossly intact.           Labs on Admission:  Basic Metabolic Panel:  Recent Labs Lab 01/27/15 1525  NA 137  K 3.7  CL 99*  CO2 26  GLUCOSE 101*  BUN 31*  CREATININE 1.33*  CALCIUM 9.8   Liver Function Tests: No results for input(s): AST, ALT, ALKPHOS, BILITOT, PROT, ALBUMIN in the last 168 hours. No results for input(s): LIPASE, AMYLASE in the last 168 hours. No results for input(s): AMMONIA in the last 168 hours. CBC:  Recent Labs Lab 01/27/15 1525  WBC 10.4  NEUTROABS 5.7  HGB 13.7  HCT 41.0  MCV 87.4  PLT 226   Cardiac Enzymes:  Recent Labs Lab 01/27/15 1525  TROPONINI <0.03    BNP (last 3 results) No results for input(s): BNP in the last 8760 hours.  ProBNP (last 3 results) No results for input(s): PROBNP in the last 8760 hours.  CBG: No results for input(s): GLUCAP in the last 168 hours.  Radiological Exams on  Admission: Dg Chest Port 1 View  01/27/2015   CLINICAL DATA:  Chest pain, neck and face weakness and numbness.  EXAM: PORTABLE CHEST - 1 VIEW  COMPARISON:  November 28, 2009  FINDINGS: The heart size and mediastinal contours are stable. The heart size is enlarged. There is no focal infiltrate, pulmonary edema, or pleural effusion. The visualized skeletal structures are stable.  IMPRESSION: No active cardiopulmonary disease.   Electronically Signed   By: Abelardo Diesel M.D.   On: 01/27/2015 15:41    EKG: Independently reviewed. Normal sinus rhythm with normal intervals. 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 No acute ST changes  Assessment/Plan Present on Admission:  . Upper GI bleed . Chest pain  This patient was discussed with the ED physician, including pertinent vitals, physical exam findings, labs, and imaging.  We also discussed care given by the ED provider.  #1 upper GI bleed #2 chest pain #3 diabetes type 2 #4 hypertension #5 gout #6 hyperlipidemia  Admit patient to telemetry Stop prednisone - no NSAIDs Start Protonix bolus and drip Keep nothing by mouth except for sips with meds If continues to have hematemesis, will place NG tube in place to suction Chest pain likely secondary to upper GI bleed, however will obtain troponin 6 hours from first troponin. If continues to be negative, no need for further testing Recheck CBC in the morning Glucose elevated secondary to prednisone. Hold Actos as the patient is nothing by mouth. We'll check CBGs every 6 hours and cover with sliding scale insulin Consult gastroenterology Continue antihypertensives, allopurinol, statin Hold aspirin  DVT prophylaxis: SCDs  Consultants: Gastroenterology  Code Status: Full code  Family Communication: Daughter in the room   Disposition Plan: Home following improvement and resolution of hematemesis  Elizabeth Mainland, Elizabeth Mcintyre [facility] Pager [phone]

## 2015-01-27 NOTE — ED Notes (Addendum)
Pt c/o new onset of dizziness and severe headache since 11 this morning.  Pt reports that she's been "feeling off for a couple weeks, especially yesterday" but the dizziness and headache started today.  Pt has been on Prednisone for the past couple weeks. Pt also complains of chest pain that radiates into head and neck with nausea and indigestion.

## 2015-01-27 NOTE — ED Notes (Signed)
Dr. Stinson at bedside.  

## 2015-01-27 NOTE — ED Provider Notes (Signed)
CSN: [id number]     Arrival date & time 01/27/15  1504 History   First MD Initiated Contact with Patient 01/27/15 1510     Chief Complaint  Patient presents with  . Dizziness  . Headache     (Consider location/radiation/quality/duration/timing/severity/associated sxs/prior Treatment) Patient is a 74 y.o. female presenting with dizziness and headaches.  Dizziness Associated symptoms: headaches   Headache Associated symptoms: dizziness   .... Complains of tightness and pressure in her chest for 2 days with radiation to neck and chin. Additionally, review systems positive for dizziness, blurred vision, nausea, questionable dyspnea.  Dizziness and headache today. Severity is moderate. Cardiac cath 2 in the past negative per patient's history. She is currently in prednisone for gout. Episode of coffee-ground emesis in the emergency department.  Past Medical History  Diagnosis Date  . Diabetes mellitus     type 2  . Hyperlipidemia   . Hypertension   . Asthma   . NECK AND BACK PAIN 11/26/2010  . HYPERTENSION 11/09/2007  . HYPERLIPIDEMIA 11/09/2007  . HIP PAIN, RIGHT 11/26/2010  . HEMATURIA UNSPECIFIED 11/26/2010  . Vaginitis 01/01/2011  . ASTHMA 11/09/2007  . ALLERGIC RHINITIS CAUSE UNSPECIFIED 11/26/2010  . DIABETES MELLITUS, TYPE II 11/09/2007  . 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 Lymphadenopathy 04/03/2011  . Diverticulosis 03/2011  . Fatigue 07/22/2011  . Atypical chest pain 07/22/2011  . Sinusitis acute 12/05/2011  . Visual disturbance of one eye 12/05/2011  . Stress incontinence   . History of rheumatoid arthritis     during 30's, was treated.  . Depression    Past Surgical History  Procedure Laterality Date  . Abdominal hysterectomy    . Cardiac catheterization      X 2, last one in 1998  . Colonoscopy    . Upper gastrointestinal endoscopy     Family History  Problem Relation Age of Onset  . Heart disease Mother   . Osteoarthritis Mother   . Sudden death Father   .  Single kidney Father   . Other Father     h/o severe MVA injuries  . Hyperlipidemia Sister   . Other Daughter     Myalgias  . Fibromyalgia Daughter   . Allergies Daughter   . Heart disease Maternal Grandfather   . Sudden death Paternal Grandmother   . Diabetes Paternal Grandfather   . Heart disease Daughter   . Other Daughter     palpitations  . Pulmonary fibrosis Maternal Aunt   . Cancer Paternal Uncle   . Pulmonary fibrosis Maternal Aunt    History  Substance Use Topics  . Smoking status: Never Smoker   . Smokeless tobacco: Never Used  . Alcohol Use: No   OB History    No data available     Review of Systems  Neurological: Positive for dizziness and headaches.  All other systems reviewed and are negative.     Allergies  Codeine phosphate  Home Medications   Prior to Admission medications   Medication Sig Start Date End Date Taking? Authorizing Provider  allopurinol (ZYLOPRIM) 100 MG tablet Take 1 tablet (100 mg total) by mouth daily. 06/23/14  Yes Irene Pap, NP  amLODipine (NORVASC) 5 MG tablet Take 1 tablet (5 mg total) by mouth daily. 12/29/14  Yes Irene Pap, NP  aspirin EC 81 MG tablet Take 81 mg by mouth daily.   Yes Historical Provider, MD  cholecalciferol (VITAMIN D) 1000 UNITS tablet Take 5,000 Units by mouth daily.  Yes Historical Provider, MD  FLUoxetine (PROZAC) 20 MG capsule Take 1 capsule (20 mg total) by mouth daily. 08/02/14  Yes Irene Pap, NP  levothyroxine (SYNTHROID, LEVOTHROID) 25 MCG tablet Take 1 tablet (25 mcg total) by mouth daily before breakfast. 06/21/14  Yes Irene Pap, NP  LORazepam (ATIVAN) 1 MG tablet Take 1/2 to 1T po bid PRN anxiety Patient taking differently: Take 0.5-1 mg by mouth 2 (two) times daily as needed for anxiety.  10/24/14  Yes Irene Pap, NP  losartan (COZAAR) 100 MG tablet Take 1 tablet (100 mg total) by mouth daily. 10/27/14  Yes Irene Pap, NP  pioglitazone (ACTOS) 15 MG tablet Take 1 tablet (15  mg total) by mouth daily. Take with largest meal of day. 11/03/14  Yes Irene Pap, NP  predniSONE (DELTASONE) 10 MG tablet Take 2 T po qam X 1wk, then 1 T po qd X 1 week, then 1/2T PO X 1wk 11/24/14  Yes Irene Pap, NP  ranitidine (ZANTAC) 150 MG tablet Take 1 tablet (150 mg total) by mouth 2 (two) times daily. 10/27/14  Yes Irene Pap, NP  rosuvastatin (CRESTOR) 40 MG tablet Take 1 tablet (40 mg total) by mouth daily. 11/03/14  Yes Irene Pap, NP  vitamin B-12 (CYANOCOBALAMIN) 1000 MCG tablet Take 1,000 mcg by mouth daily.   Yes Historical Provider, MD  aspirin 81 MG tablet Take 1 tablet (81 mg total) by mouth daily. Patient not taking: Reported on 01/27/2015 12/24/12   Mosie Lukes, MD  Blood Glucose Monitoring Suppl (ONE TOUCH ULTRA SYSTEM KIT) W/DEVICE KIT 1 kit by Does not apply route once. 06/01/14   Irene Pap, NP  fluticasone (FLONASE) 50 MCG/ACT nasal spray 1 spray L nare twice daily. Patient taking differently: Place 1 spray into both nostrils daily as needed for allergies.  06/23/14   Irene Pap, NP  glucose blood (FREESTYLE LITE) test strip Use as instructed 08/07/14   Irene Pap, NP  NON FORMULARY Accu-Chek Lancets misc- Check blood sugars bid and prn     Historical Provider, MD  NON FORMULARY Accu-Check Aviva Kit     Historical Provider, MD  NON FORMULARY BD Disp Needles 30G X 1/2" misc     Historical Provider, MD  [facility] DELICA LANCETS FINE MISC Test twice daily. 08/07/14   Irene Pap, NP  traMADol (ULTRAM) 50 MG tablet Take 1 tablet (50 mg total) by mouth 2 (two) times daily as needed. Take 1T po qhs X 3d, then take 1 T po bid 10/27/14   Irene Pap, NP   BP 163/61 mmHg  Pulse 67  Temp(Src) 98.3 F (36.8 C) (Oral)  Resp 20  Ht _0  (1.575 m)  Wt 155 lb (70.308 kg)  BMI 28.34 kg/m2  SpO2 97% Physical Exam  Constitutional: She is oriented to person, place, and time. She appears well-developed and well-nourished.  HENT:  Head: Normocephalic and  atraumatic.  Eyes: Conjunctivae and EOM are normal. Pupils are equal, round, and reactive to light.  Neck: Normal range of motion. Neck supple.  Cardiovascular: Normal rate and regular rhythm.   Pulmonary/Chest: Effort normal and breath sounds normal.  Abdominal: Soft. Bowel sounds are normal.  Musculoskeletal: Normal range of motion.  Neurological: She is alert and oriented to person, place, and time.  Skin: Skin is warm and dry.  Psychiatric: She has a normal mood and affect. Her behavior is normal.  Nursing note and vitals  reviewed.   ED Course  Procedures (including critical care time) Labs Review Labs Reviewed  BASIC METABOLIC PANEL - Abnormal; Notable for the following:    Chloride 99 (*)    Glucose, Bld 101 (*)    BUN 31 (*)    Creatinine, Ser 1.33 (*)    GFR calc non Af Amer 39 (*)    GFR calc Af Amer 45 (*)    All other components within normal limits  CBC WITH DIFFERENTIAL/PLATELET  TROPONIN I  POCT GASTRIC OCCULT BLOOD (1-CARD TO LAB)    Imaging Review Dg Chest Port 1 View  01/27/2015   CLINICAL DATA:  Chest pain, neck and face weakness and numbness.  EXAM: PORTABLE CHEST - 1 VIEW  COMPARISON:  November 28, 2009  FINDINGS: The heart size and mediastinal contours are stable. The heart size is enlarged. There is no focal infiltrate, pulmonary edema, or pleural effusion. The visualized skeletal structures are stable.  IMPRESSION: No active cardiopulmonary disease.   Electronically Signed   By: Abelardo Diesel M.D.   On: 01/27/2015 15:41     EKG Interpretation None      MDM   Final diagnoses:  Chest pain  Hematemesis with nausea    Patient complains of chest pain, dizziness. She also had episode of coffee ground emesis in the ED. IV Protonix. Admit to general medicine    Nat Christen, MD 01/27/15 [phone]

## 2015-01-27 NOTE — ED Notes (Signed)
Patient spitting up blood. Brown, coffee grounds appearance. Patient with copious amounts of belching and c/o stomach bloating. Stomach more firm than initial assessment. MD notified and in to reassess patient. Verbal orders obtained for 40 mg IV Protonix and 4 mg IV Zofran. See MAR for further medication documentation. Patient remains alert, oriented. Ambulated to restroom with steady gait. States improvement in dizziness and vision at present time.

## 2015-01-28 ENCOUNTER — Encounter (HOSPITAL_COMMUNITY)
Admission: EM | Disposition: A | Discharge status: Home / Self Care | Payer: Self-pay | Source: Home / Self Care | Attending: Internal Medicine

## 2015-01-28 DIAGNOSIS — E1122 Type 2 diabetes mellitus with diabetic chronic kidney disease: Secondary | ICD-10-CM

## 2015-01-28 DIAGNOSIS — R0789 Other chest pain: Secondary | ICD-10-CM

## 2015-01-28 DIAGNOSIS — K922 Gastrointestinal hemorrhage, unspecified: Secondary | ICD-10-CM

## 2015-01-28 DIAGNOSIS — K219 Gastro-esophageal reflux disease without esophagitis: Secondary | ICD-10-CM

## 2015-01-28 DIAGNOSIS — K2289 Other specified disease of esophagus: Secondary | ICD-10-CM | POA: Insufficient documentation

## 2015-01-28 DIAGNOSIS — K210 Gastro-esophageal reflux disease with esophagitis: Secondary | ICD-10-CM

## 2015-01-28 DIAGNOSIS — N189 Chronic kidney disease, unspecified: Secondary | ICD-10-CM

## 2015-01-28 DIAGNOSIS — K229 Disease of esophagus, unspecified: Secondary | ICD-10-CM

## 2015-01-28 DIAGNOSIS — I10 Essential (primary) hypertension: Secondary | ICD-10-CM

## 2015-01-28 HISTORY — PX: ESOPHAGOGASTRODUODENOSCOPY: SHX5428

## 2015-01-28 LAB — CBC
HEMATOCRIT: 35.9 % — AB (ref 36.0–46.0)
Hemoglobin: 12 g/dL (ref 12.0–15.0)
MCH: 29.6 pg (ref 26.0–34.0)
MCHC: 33.4 g/dL (ref 30.0–36.0)
MCV: 88.4 fL (ref 78.0–100.0)
Platelets: 175 10*3/uL (ref 150–400)
RBC: 4.06 MIL/uL (ref 3.87–5.11)
RDW: 14.1 % (ref 11.5–15.5)
WBC: 7.3 10*3/uL (ref 4.0–10.5)

## 2015-01-28 LAB — GLUCOSE, CAPILLARY
GLUCOSE-CAPILLARY: 137 mg/dL — AB (ref 65–99)
GLUCOSE-CAPILLARY: 74 mg/dL (ref 65–99)
GLUCOSE-CAPILLARY: 85 mg/dL (ref 65–99)
GLUCOSE-CAPILLARY: 87 mg/dL (ref 65–99)
Glucose-Capillary: 137 mg/dL — ABNORMAL HIGH (ref 65–99)
Glucose-Capillary: 80 mg/dL (ref 65–99)

## 2015-01-28 SURGERY — EGD (ESOPHAGOGASTRODUODENOSCOPY)
Anesthesia: Moderate Sedation

## 2015-01-28 MED ORDER — ONDANSETRON HCL 4 MG/2ML IJ SOLN
INTRAMUSCULAR | Status: AC
  Filled 2015-01-28: qty 2

## 2015-01-28 MED ORDER — SODIUM CHLORIDE 0.9 % IV SOLN
INTRAVENOUS | Status: DC
  Administered 2015-01-28: 12:00:00 via INTRAVENOUS

## 2015-01-28 MED ORDER — SODIUM CHLORIDE 0.9 % IV SOLN: INTRAVENOUS | Status: DC

## 2015-01-28 MED ORDER — MIDAZOLAM HCL 5 MG/5ML IJ SOLN
INTRAMUSCULAR | Status: AC
  Filled 2015-01-28: qty 10

## 2015-01-28 MED ORDER — LIDOCAINE VISCOUS 2 % MT SOLN
OROMUCOSAL | Status: AC
  Filled 2015-01-28: qty 15

## 2015-01-28 MED ORDER — DEXTROSE 50 % IV SOLN: 25.0000 mL | Freq: Once | INTRAVENOUS | Status: AC

## 2015-01-28 MED ORDER — MIDAZOLAM HCL 5 MG/5ML IJ SOLN
INTRAMUSCULAR | Status: DC | PRN
  Administered 2015-01-28 (×2): 1 mg via INTRAVENOUS
  Administered 2015-01-28: 2 mg via INTRAVENOUS

## 2015-01-28 MED ORDER — ONDANSETRON HCL 4 MG/2ML IJ SOLN
INTRAMUSCULAR | Status: DC | PRN
  Administered 2015-01-28: 4 mg via INTRAVENOUS

## 2015-01-28 MED ORDER — PANTOPRAZOLE SODIUM 40 MG IV SOLR
40.0000 mg | Freq: Two times a day (BID) | INTRAVENOUS | Status: DC
  Administered 2015-01-28 (×2): 40 mg via INTRAVENOUS
  Filled 2015-01-28 (×2): qty 40

## 2015-01-28 MED ORDER — DEXTROSE 50 % IV SOLN
INTRAVENOUS | Status: AC
  Administered 2015-01-28: 01:00:00
  Filled 2015-01-28: qty 50

## 2015-01-28 MED ORDER — MEPERIDINE HCL 100 MG/ML IJ SOLN
INTRAMUSCULAR | Status: DC | PRN
  Administered 2015-01-28 (×3): 25 mg via INTRAVENOUS

## 2015-01-28 MED ORDER — DEXTROSE 50 % IV SOLN
1.0000 | Freq: Once | INTRAVENOUS | Status: AC
  Administered 2015-01-28: 50 mL via INTRAVENOUS

## 2015-01-28 MED ORDER — DEXTROSE 50 % IV SOLN
INTRAVENOUS | Status: AC
  Filled 2015-01-28: qty 50

## 2015-01-28 MED ORDER — MEPERIDINE HCL 100 MG/ML IJ SOLN
INTRAMUSCULAR | Status: AC
  Filled 2015-01-28: qty 2

## 2015-01-28 MED ORDER — EPINEPHRINE HCL 0.1 MG/ML IJ SOSY
PREFILLED_SYRINGE | INTRAMUSCULAR | Status: AC
  Filled 2015-01-28: qty 10

## 2015-01-28 MED ORDER — LIDOCAINE VISCOUS 2 % MT SOLN
OROMUCOSAL | Status: DC | PRN
  Administered 2015-01-28: 4 mL via OROMUCOSAL

## 2015-01-28 MED ORDER — STERILE WATER FOR IRRIGATION IR SOLN
Status: DC | PRN
  Administered 2015-01-28: 13:00:00

## 2015-01-28 NOTE — Progress Notes (Signed)
[facility] PROGRESS NOTE  DEIRA SHIMER WUJ:[id number] DOB: [birth year] DOA: 01/27/2015 PCP: Irene Pap, NP  Assessment/Plan: 1. Upper GI bleed -Patient reporting taking steroids on-and-off for the past 2 and had reasonably been on a steroid course for acute gout. She is also on aspirin. She was found to have an episode of hematemesis in the emergency room. I suspect upper GI bleed related to ulcer disease which may have been caused by systemic steroids. -Will continue Protonix drip, Carafate, IV fluids, as aspirin and steroids have been discontinued. Awaiting GI consult for consideration of upper endoscopy. -Showing stable hemoglobin at 12.0. She is hemodynamically stable, has not had further episodes of hematemesis. -Keep patient nothing by mouth until evaluated by GI.  2.  Type 2 diabetes mellitus -Patient kept nothing by mouth for possible endoscopic procedure, have a blood sugar's and for this morning. She was given amp of D50. Continue monitoring blood sugars every 4 hours.  3.  Hypertension. -Blood pressures have been stable, will continue Norvasc 5 mg by mouth daily and losartan 100 mg by mouth daily  4.  Chest pain. -Patient presenting with atypical chest pain, which I think is likely due to gastritis/ulcers, particularly in setting of upper GI bleed. -She had 2 negative troponins, will pursue GI workup. -Aspirin has been discontinued.  5.  Dyslipidemia -Labs showing LDL of 176, will continue Crestor 40 mg by mouth daily.  6.  Hypothyroidism -TSH within normal limits at 2.9, will continue Synthroid 25 g by mouth daily   Code Status: Full code Family Communication: Spoke with her daughter was present at bedside Disposition Plan:    Consultants:  GI   HPI/Subjective: Patient is a pleasant 74 year old female with past medical history of type 2 diabetes mellitus, hypertension, dyslipidemia, admitted to the medicine service 01/27/2015 when she presented with  complaints of atypical chest pain, found to have an episode of hematemesis in the emergency room that was witnessed by staff. Patient reported taking prednisone on and off for the past year and had recently been on steroids for acute gout. Labs revealed hemoglobin of 13.7. She was placed on IV Protonix and GI consultation requested.  Objective: Filed Vitals:   01/28/15 0558  BP: 134/63  Pulse: 56  Temp: 97.7 F (36.5 C)  Resp: 20    Intake/Output Summary (Last 24 hours) at 01/28/15 0832 Last data filed at 01/27/15 2153  Gross per 24 hour  Intake    320 ml  Output      0 ml  Net    320 ml   Filed Weights   01/27/15 1513 01/27/15 2020  Weight: 70.308 kg (155 lb) 71.623 kg (157 lb 14.4 oz)    Exam:   General:  Patient is awake and alert, oriented, no distress  Cardiovascular: Regular rate and rhythm normal S1-S2 no murmurs rubs or gallops, no extremity edema  Respiratory: Normal respiratory effort, lungs are clear to auscultation bilaterally  Abdomen: Soft although has mild pain to palpation across epigastric region, there was no guarding or rebound tenderness  Musculoskeletal: No edema  Data Reviewed: Basic Metabolic Panel:  Recent Labs Lab 01/27/15 1525  NA 137  K 3.7  CL 99*  CO2 26  GLUCOSE 101*  BUN 31*  CREATININE 1.33*  CALCIUM 9.8   Liver Function Tests: No results for input(s): AST, ALT, ALKPHOS, BILITOT, PROT, ALBUMIN in the last 168 hours. No results for input(s): LIPASE, AMYLASE in the last 168 hours. No results for input(s): AMMONIA  in the last 168 hours. CBC:  Recent Labs Lab 01/27/15 1525 01/28/15 0509  WBC 10.4 7.3  NEUTROABS 5.7  --   HGB 13.7 12.0  HCT 41.0 35.9*  MCV 87.4 88.4  PLT 226 175   Cardiac Enzymes:  Recent Labs Lab 01/27/15 1525 01/27/15 2039  TROPONINI <0.03 <0.03   BNP (last 3 results) No results for input(s): BNP in the last 8760 hours.  ProBNP (last 3 results) No results for input(s): PROBNP in the last  8760 hours.  CBG:  Recent Labs Lab 01/28/15 0005 01/28/15 0104 01/28/15 0601 01/28/15 0725  GLUCAP 74 137* 85 80    No results found for this or any previous visit (from the past 240 hour(s)).   Studies: Dg Chest Port 1 View  01/27/2015   CLINICAL DATA:  Chest pain, neck and face weakness and numbness.  EXAM: PORTABLE CHEST - 1 VIEW  COMPARISON:  November 28, 2009  FINDINGS: The heart size and mediastinal contours are stable. The heart size is enlarged. There is no focal infiltrate, pulmonary edema, or pleural effusion. The visualized skeletal structures are stable.  IMPRESSION: No active cardiopulmonary disease.   Electronically Signed   By: Abelardo Diesel M.D.   On: 01/27/2015 15:41    Scheduled Meds: . allopurinol  100 mg Oral Daily  . amLODipine  5 mg Oral Daily  . dextrose      . FLUoxetine  20 mg Oral Daily  . insulin aspart  0-15 Units Subcutaneous 4 times per day  . levothyroxine  25 mcg Oral QAC breakfast  . losartan  100 mg Oral Daily  . rosuvastatin  40 mg Oral q1800  . sodium chloride  3 mL Intravenous Q12H  . sucralfate  1 g Oral TID WC & HS   Continuous Infusions: . sodium chloride 75 mL/hr at 01/28/15 0829  . pantoprozole (PROTONIX) infusion 8 mg/hr (01/27/15 2153)    Active Problems:   DM (diabetes mellitus), type 2 with renal complications   Upper GI bleed   Chest pain    Time spent: 35 minutes    Kelvin Cellar  [facility] Pager [phone]. If 7PM-7AM, please contact night-coverage at [email or web address], password [facility] 01/28/2015, 8:32 AM  LOS: 1 day

## 2015-01-28 NOTE — Progress Notes (Signed)
Utilization review Completed Sharita Bienaime RN BSN   

## 2015-01-28 NOTE — Progress Notes (Signed)
NT made nurse aware of CBG 74. Pt is NPO right now. Paged Dr Rogue Bussing, who ordered 1 amp of D-50. Will administer that as soon as available and will re-check CBG. No complaints per pt at this time. Will continue to monitor pt throughout night. Bed remains in lowest position and call bell is within reach.

## 2015-01-28 NOTE — Op Note (Signed)
[facility] [address] [place], [address]   ENDOSCOPY PROCEDURE REPORT  PATIENT: [name], [name]  MR#: [id number] BIRTHDATE: 1941-02-09 , 64  yrs. old GENDER: female ENDOSCOPIST: R.  Garfield Cornea, MD [facility] REFERRED BY:     Dr. Wyline Copas, Dr. Ernestine Conrad PROCEDURE DATE:  2015/02/05 PROCEDURE:  EGD, diagnostic INDICATIONS:  Hematemesis; esophageal dysphagia; chronic GERD. MEDICATIONS: Versed 4 mg IV and Demerol 75 mg IV in divided doses. Xylocaine gel orally.  Zofran 4 mg IV. ASA CLASS:      Class III  CONSENT: The risks, benefits, limitations, alternatives and imponderables have been discussed.  The potential for biopsy, esophogeal dilation, etc. have also been reviewed.  Questions have been answered.  All parties agreeable.  Please see the history and physical in the medical record for more information.  DESCRIPTION OF PROCEDURE: After the risks benefits and alternatives of the procedure were thoroughly explained, informed consent was obtained.  The EG-[id number] ([id number]) endoscope was introduced through the mouth and advanced to the second portion of the duodenum , limited by Without limitations. The instrument was slowly withdrawn as the mucosa was fully examined.    4 x 4 millimeter distal esophageal ulcer noted with a clean base along with a superimposed Schatzki's ring. Stomach empty.  Normal-appearing gastric mucosa.  Patent pylorus. Normal-appearing first and second portion of the duodenum.  No biopsies or dilation performed today.  Retroflexed views revealed no abnormalities.     The scope was then withdrawn from the patient and the procedure completed.  COMPLICATIONS: There were no immediate complications.  ENDOSCOPIC IMPRESSION: Distal esophageal ulcer consistent with reflux esophagitis. Schatzki's ring present?"not manipulated because of recent bleeding  RECOMMENDATIONS: Change PPI to twice a day therapy. Soft diet. Continue Carafate for 5  days. Outpatient follow-up. If she continues to have esophageal dysphagia as an outpatient, we can bring her back for an elective esophageal dilation.  REPEAT EXAM:  eSigned:  R. Garfield Cornea, MD Rosalita Chessman [facility] 02/05/15 12:58 PM    CC:  CPT CODES: ICD CODES:  The ICD and CPT codes recommended by this software are interpretations from the data that the clinical staff has captured with the software.  The verification of the translation of this report to the ICD and CPT codes and modifiers is the sole responsibility of the health care institution and practicing physician where this report was generated.  [employer]. will not be held responsible for the validity of the ICD and CPT codes included on this report.  AMA assumes no liability for data contained or not contained herein. CPT is a Designer, television/film set of the [employer].  PATIENT NAME:  Elizabeth Mcintyre, Elizabeth Mcintyre MR#: [id number]

## 2015-01-28 NOTE — Consult Note (Signed)
Referring Provider: No ref. provider found Primary Care Physician:  Irene Pap, NP Primary Gastroenterologist:  Dr. Maurene Capes  Reason for Consultation:  Hematemesis  HPI: Pleasant 74 year old lady admitted last night through the ED after coming to the hospital with the headache, nausea and retrosternal burning. In the ED, she developed vomiting. According to her daughters, he a good 29 times. Initially was frothy clear vomitus, subsequently, she brought up some gross blood. No coffee grounds. No melena or hematochezia. She has remained hemodynamically stable with an overnight drop in hemoglobin from 13.7-12. She is on IV PPI infusion and Carafate. Patient relates indigestion/reflux symptoms for months now. Takes over-the-counter ranitidine. Describes intermittent esophageal dysphagia to solids and pills.  Takes one aspirin daily and low-dose prednisone for gout. Distant history of rheumatoid arthritis. No history of other NSAIDs area no history of peptic ulcer disease or chronic liver disease.  No prior EGD. Has seen Dr. Maurene Capes in [place] on multiple occasions for colonoscopy. History of colonic polyps last colonoscopy reported a couple of years ago. She states she is not due as of yet.   This morning, patient states headache and nausea are both much improved. She is hungry and wants to eat.    Past Medical History  Diagnosis Date  . Diabetes mellitus     type 2  . Hyperlipidemia   . Hypertension   . Asthma   . NECK AND BACK PAIN 11/26/2010  . HYPERTENSION 11/09/2007  . HYPERLIPIDEMIA 11/09/2007  . HIP PAIN, RIGHT 11/26/2010  . HEMATURIA UNSPECIFIED 11/26/2010  . Vaginitis 01/01/2011  . ASTHMA 11/09/2007  . ALLERGIC RHINITIS CAUSE UNSPECIFIED 11/26/2010  . DIABETES MELLITUS, TYPE II 11/09/2007  . 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 Lymphadenopathy 04/03/2011  . Diverticulosis 03/2011  . Fatigue 07/22/2011  . Atypical chest pain 07/22/2011  . Sinusitis acute 12/05/2011  . Visual  disturbance of one eye 12/05/2011  . Stress incontinence   . History of rheumatoid arthritis     during 30's, was treated.  . Depression     Past Surgical History  Procedure Laterality Date  . Abdominal hysterectomy    . Cardiac catheterization      X 2, last one in 1998  . Colonoscopy    . Upper gastrointestinal endoscopy      Prior to Admission medications   Medication Sig Start Date End Date Taking? Authorizing Provider  allopurinol (ZYLOPRIM) 100 MG tablet Take 1 tablet (100 mg total) by mouth daily. 06/23/14  Yes Irene Pap, NP  amLODipine (NORVASC) 5 MG tablet Take 1 tablet (5 mg total) by mouth daily. 12/29/14  Yes Irene Pap, NP  aspirin EC 81 MG tablet Take 81 mg by mouth daily.   Yes Historical Provider, MD  cholecalciferol (VITAMIN D) 1000 UNITS tablet Take 5,000 Units by mouth daily.   Yes Historical Provider, MD  FLUoxetine (PROZAC) 20 MG capsule Take 1 capsule (20 mg total) by mouth daily. 08/02/14  Yes Irene Pap, NP  levothyroxine (SYNTHROID, LEVOTHROID) 25 MCG tablet Take 1 tablet (25 mcg total) by mouth daily before breakfast. 06/21/14  Yes Irene Pap, NP  LORazepam (ATIVAN) 1 MG tablet Take 1/2 to 1T po bid PRN anxiety Patient taking differently: Take 0.5-1 mg by mouth 2 (two) times daily as needed for anxiety.  10/24/14  Yes Irene Pap, NP  losartan (COZAAR) 100 MG tablet Take 1 tablet (100 mg total) by mouth daily. 10/27/14  Yes Irene Pap, NP  pioglitazone (ACTOS) 15 MG tablet Take 1 tablet (15 mg total) by mouth daily. Take with largest meal of day. 11/03/14  Yes Irene Pap, NP  predniSONE (DELTASONE) 10 MG tablet Take 2 T po qam X 1wk, then 1 T po qd X 1 week, then 1/2T PO X 1wk 11/24/14  Yes Irene Pap, NP  ranitidine (ZANTAC) 150 MG tablet Take 1 tablet (150 mg total) by mouth 2 (two) times daily. 10/27/14  Yes Irene Pap, NP  rosuvastatin (CRESTOR) 40 MG tablet Take 1 tablet (40 mg total) by mouth daily. 11/03/14  Yes Irene Pap,  NP  vitamin B-12 (CYANOCOBALAMIN) 1000 MCG tablet Take 1,000 mcg by mouth daily.   Yes Historical Provider, MD  aspirin 81 MG tablet Take 1 tablet (81 mg total) by mouth daily. Patient not taking: Reported on 01/27/2015 12/24/12   Mosie Lukes, MD  Blood Glucose Monitoring Suppl (ONE TOUCH ULTRA SYSTEM KIT) W/DEVICE KIT 1 kit by Does not apply route once. 06/01/14   Irene Pap, NP  fluticasone (FLONASE) 50 MCG/ACT nasal spray 1 spray L nare twice daily. Patient taking differently: Place 1 spray into both nostrils daily as needed for allergies.  06/23/14   Irene Pap, NP  glucose blood (FREESTYLE LITE) test strip Use as instructed 08/07/14   Irene Pap, NP  NON FORMULARY Accu-Chek Lancets misc- Check blood sugars bid and prn     Historical Provider, MD  NON FORMULARY Accu-Check Aviva Kit     Historical Provider, MD  NON FORMULARY BD Disp Needles 30G X 1/2" misc     Historical Provider, MD  [facility] DELICA LANCETS FINE MISC Test twice daily. 08/07/14   Irene Pap, NP  traMADol (ULTRAM) 50 MG tablet Take 1 tablet (50 mg total) by mouth 2 (two) times daily as needed. Take 1T po qhs X 3d, then take 1 T po bid 10/27/14   Irene Pap, NP    Current Facility-Administered Medications  Medication Dose Route Frequency Provider Last Rate Last Dose  . 0.9 %  sodium chloride infusion   Intravenous Continuous Kelvin Cellar, MD 75 mL/hr at 01/28/15 0829    . acetaminophen (TYLENOL) tablet 650 mg  650 mg Oral Q6H PRN Truett Mainland, DO   650 mg at 01/27/15 2223   Or  . acetaminophen (TYLENOL) suppository 650 mg  650 mg Rectal Q6H PRN Tanna Savoy Stinson, DO      . allopurinol (ZYLOPRIM) tablet 100 mg  100 mg Oral Daily Tanna Savoy Stinson, DO   100 mg at 01/28/15 0830  . alum & mag hydroxide-simeth (MAALOX/MYLANTA) 200-200-20 MG/5ML suspension 30 mL  30 mL Oral Q6H PRN Tanna Savoy Stinson, DO   30 mL at 01/28/15 0029  . amLODipine (NORVASC) tablet 5 mg  5 mg Oral Daily Tanna Savoy Stinson, DO   5 mg at  01/28/15 0831  . FLUoxetine (PROZAC) capsule 20 mg  20 mg Oral Daily Tanna Savoy Stinson, DO   20 mg at 01/28/15 0830  . fluticasone (FLONASE) 50 MCG/ACT nasal spray 1 spray  1 spray Each Nare Daily PRN Tanna Savoy Stinson, DO      . insulin aspart (novoLOG) injection 0-15 Units  0-15 Units Subcutaneous 4 times per day Truett Mainland, DO   0 Units at 01/27/15 2015  . levothyroxine (SYNTHROID, LEVOTHROID) tablet 25 mcg  25 mcg Oral QAC breakfast Tanna Savoy Stinson, DO   25 mcg at 01/28/15 0830  .  losartan (COZAAR) tablet 100 mg  100 mg Oral Daily Tanna Savoy Stinson, DO   100 mg at 01/28/15 0830  . ondansetron (ZOFRAN) tablet 4 mg  4 mg Oral Q6H PRN Tanna Savoy Stinson, DO       Or  . ondansetron [facility]) injection 4 mg  4 mg Intravenous Q6H PRN Tanna Savoy Stinson, DO      . pantoprazole (PROTONIX) 80 mg in sodium chloride 0.9 % 250 mL (0.32 mg/mL) infusion  8 mg/hr Intravenous Continuous Truett Mainland, DO 25 mL/hr at 01/27/15 2153 8 mg/hr at 01/27/15 2153  . rosuvastatin (CRESTOR) tablet 40 mg  40 mg Oral q1800 Tanna Savoy Stinson, DO   40 mg at 01/27/15 2036  . sodium chloride 0.9 % injection 3 mL  3 mL Intravenous Q12H Tanna Savoy Stinson, DO   3 mL at 01/27/15 2200  . sucralfate (CARAFATE) 1 GM/10ML suspension 1 g  1 g Oral TID WC & HS Tanna Savoy Stinson, DO   1 g at 01/28/15 0830    Allergies as of 01/27/2015 - Review Complete 01/27/2015  Allergen Reaction Noted  . Codeine phosphate      Family History  Problem Relation Age of Onset  . Heart disease Mother   . Osteoarthritis Mother   . Sudden death Father   . Single kidney Father   . Other Father     h/o severe MVA injuries  . Hyperlipidemia Sister   . Other Daughter     Myalgias  . Fibromyalgia Daughter   . Allergies Daughter   . Heart disease Maternal Grandfather   . Sudden death Paternal Grandmother   . Diabetes Paternal Grandfather   . Heart disease Daughter   . Other Daughter     palpitations  . Pulmonary fibrosis Maternal Aunt   . Cancer Paternal  Uncle   . Pulmonary fibrosis Maternal Aunt     History   Social History  . Marital Status: Widowed    Spouse Name: N/A  . Number of Children: 2  . Years of Education: N/A   Occupational History  . retired    Social History Main Topics  . Smoking status: Never Smoker   . Smokeless tobacco: Never Used  . Alcohol Use: No  . Drug Use: No  . Sexual Activity: No   Other Topics Concern  . Not on file   Social History Narrative   Ms. Federer is widowed. Her young grandson lives with her, for whom she shares custody with her daughter.      Review of Systems: Gen: Denies any fever, chills, sweats, anorexia, fatigue, weakness, malaise, weight loss,positive insomnia  CV:  angina, palpitations, syncope, orthopnea, PND, peripheral edema, and claudication. Resp: Denies dyspnea at rest, dyspnea with exercise, cough, sputum, wheezing, coughing up blood, and pleurisy. GI:  denies jaundice, and fecal incontinence.    Derm: Denies rash, itching, dry skin, hives, moles, warts, or unhealing ulcers.  Psych: Denies  suicidal ideation, hallucinations, paranoia, and confusion. Heme: Denies bruising, bleeding, and enlarged lymph nodes.   Physical Exam: Vital signs in last 24 hours: Temp:  [97.7 F (36.5 C)-98.3 F (36.8 C)] 97.7 F (36.5 C) (05/15 0558) Pulse Rate:  [56-86] 56 (05/15 0558) Resp:  [16-22] 20 (05/15 0558) BP: (133-194)/(56-82) 134/63 mmHg (05/15 0558) SpO2:  [92 %-100 %] 96 % (05/15 0558) Weight:  [155 lb (70.308 kg)-157 lb 14.4 oz (71.623 kg)] 157 lb 14.4 oz (71.623 kg) (05/14 2020) Last BM Date: 01/26/15 General:  Alert,  Well-developed, well-nourished, pleasant and cooperative in NADShe is accompanied by 2 daughters.  Head:  Normocephalic and atraumatic. Eyes:  Sclera clear, no icterus.   Conjunctiva pink. Ears:  Normal auditory acuity. Nose:  No deformity, discharge,  or lesions. Mouth:  No deformity or lesions, dentition normal. Neck:  Supple; no masses or  thyromegaly. Lungs:  Clear throughout to auscultation.   No wheezes, crackles, or rhonchi. No acute distress. Heart:  Regular rate and rhythm; no murmurs, clicks, rubs,  or gallops. Abdomen:  Soft, nontender and nondistended. No masses, hepatosplenomegaly or hernias noted. Normal bowel sounds, without guarding, and without rebound.   Extremities:  Without clubbing or edema. Neurologic:  Alert and  oriented x4;  grossly normal neurologically. Skin:  Intact without significant lesions or rashes. Cervical Nodes:  No significant cervical adenopathy. Psych:  Alert and cooperative. Normal mood and affect.  Intake/Output from previous day: 05/14 0701 - 05/15 0700 In: 320 [P.O.:45; I.V.:175; IV Piggyback:100] Out: -  Intake/Output this shift:    Lab Results:  Recent Labs  01/27/15 1525 01/28/15 0509  WBC 10.4 7.3  HGB 13.7 12.0  HCT 41.0 35.9*  PLT 226 175   BMET  Recent Labs  01/27/15 1525  NA 137  K 3.7  CL 99*  CO2 26  GLUCOSE 101*  BUN 31*  CREATININE 1.33*  CALCIUM 9.8   Studies/Results: Dg Chest Port 1 View  01/27/2015   CLINICAL DATA:  Chest pain, neck and face weakness and numbness.  EXAM: PORTABLE CHEST - 1 VIEW  COMPARISON:  November 28, 2009  FINDINGS: The heart size and mediastinal contours are stable. The heart size is enlarged. There is no focal infiltrate, pulmonary edema, or pleural effusion. The visualized skeletal structures are stable.  IMPRESSION: No active cardiopulmonary disease.   Electronically Signed   By: Abelardo Diesel M.D.   On: 01/27/2015 15:41    Impression:   Pleasant 74 year old lady with chronic GERD symptoms and recent esophageal dysphagia admitted through the ED last evening with repeated episodes of nausea vomiting and finally hematemesis. She's had no significant abdominal pain. She has remained hemodynamically stable overnight with a relatively modest drop in her hemoglobin. Takes low-dose aspirin and prednisone daily.  It sounds as though  she may have experienced a Mallory-Weiss tear. Chronic underlying reflux esophagitis not excluded as his peptic ulcer disease along with other occult pathology.   Recommendations:   Agree with IV PPI therapy empirically. I have offered patient a diagnostic EGD today.The risks, benefits, limitations, alternatives and imponderables have been reviewed with the patient. Potential for esophageal dilation, biopsy, etc. have also been reviewed.  Questions have been answered;  all parties agreeable.  The potential for bleeding control therapy reviewed. Depending on the cause of bleeding, if a lesion is found which is amenable to esophageal dilation, she may undergo esophageal dilation today or dilation would be deferred to a more elective setting. Patient seems to understand.  Further recommendations to follow.  Thanks for asking me to see this nice lady today.          Notice:  This dictation was prepared with Dragon dictation along with smaller phrase technology. Any transcriptional errors that result from this process are unintentional and may not be corrected upon review.

## 2015-01-29 ENCOUNTER — Telehealth: Payer: Self-pay | Admitting: Gastroenterology

## 2015-01-29 ENCOUNTER — Encounter: Payer: Self-pay | Admitting: Gastroenterology

## 2015-01-29 ENCOUNTER — Ambulatory Visit: Payer: Medicare Other | Admitting: Gastroenterology

## 2015-01-29 ENCOUNTER — Other Ambulatory Visit: Payer: Self-pay

## 2015-01-29 LAB — GLUCOSE, CAPILLARY
Glucose-Capillary: 110 mg/dL — ABNORMAL HIGH (ref 65–99)
Glucose-Capillary: 85 mg/dL (ref 65–99)
Glucose-Capillary: 97 mg/dL (ref 65–99)

## 2015-01-29 LAB — CBC
HCT: 35 % — ABNORMAL LOW (ref 36.0–46.0)
Hemoglobin: 11.5 g/dL — ABNORMAL LOW (ref 12.0–15.0)
MCH: 29.3 pg (ref 26.0–34.0)
MCHC: 32.9 g/dL (ref 30.0–36.0)
MCV: 89.1 fL (ref 78.0–100.0)
PLATELETS: 166 10*3/uL (ref 150–400)
RBC: 3.93 MIL/uL (ref 3.87–5.11)
RDW: 14.1 % (ref 11.5–15.5)
WBC: 6 10*3/uL (ref 4.0–10.5)

## 2015-01-29 LAB — BASIC METABOLIC PANEL
Anion gap: 7 (ref 5–15)
BUN: 16 mg/dL (ref 6–20)
CALCIUM: 8.4 mg/dL — AB (ref 8.9–10.3)
CO2: 25 mmol/L (ref 22–32)
Chloride: 108 mmol/L (ref 101–111)
Creatinine, Ser: 1.23 mg/dL — ABNORMAL HIGH (ref 0.44–1.00)
GFR, EST AFRICAN AMERICAN: 50 mL/min — AB (ref >60)
GFR, EST NON AFRICAN AMERICAN: 43 mL/min — AB (ref >60)
Glucose, Bld: 94 mg/dL (ref 65–99)
Potassium: 3.8 mmol/L (ref 3.5–5.1)
SODIUM: 140 mmol/L (ref 135–145)

## 2015-01-29 MED ORDER — PANTOPRAZOLE SODIUM 40 MG PO TBEC: 40.0000 mg | DELAYED_RELEASE_TABLET | Freq: Every day | ORAL | Status: DC

## 2015-01-29 MED ORDER — SUCRALFATE 1 G PO TABS: 1.0000 g | ORAL_TABLET | Freq: Three times a day (TID) | ORAL | Status: DC

## 2015-01-29 NOTE — Discharge Summary (Signed)
Physician Discharge Summary  Elizabeth Mcintyre YBW:[id number] DOB: 1941/03/01 DOA: 01/27/2015  PCP: Irene Pap, NP  Admit date: 01/27/2015 Discharge date: 01/29/2015  Time spent: 35 minutes  Recommendations for Outpatient Follow-up:  1. Please follow up with CBC on hospital follow up visit, patient treated for upper GI bleed. 2. Follow up on patient's blood sugars, has history of DM  3. She was discharged on Protonix and Carafate  Discharge Diagnoses:  Active Problems:   DM (diabetes mellitus), type 2 with renal complications   Upper GI bleed   Chest pain   Mucosal abnormality of esophagus   Discharge Condition: Stable  Diet recommendation: Heart Healthy  Filed Weights   01/27/15 1513 01/27/15 2020  Weight: 70.308 kg (155 lb) 71.623 kg (157 lb 14.4 oz)    History of present illness:  Elizabeth Mcintyre is a 74 y.o. female  With history of type 2 diabetes, hypertension, hyperlipidemia, gout. She presents to the hospital with some mild dizziness and chest discomfort. She has been on prednisone for the past 2 weeks due to a gout flare. Her doses to 40 mg daily for the past 10 days and has decreased to 20 mg approximately 4 days ago. Her discomfort started earlier today and has worsened mildly. The middle of her workup, she had an episode of hematemesis which was witnessed by her nurse. No provoking or palliating factors. The patient had a GI cocktail and restarted which helped mildly.  Hospital Course:  Patient is a pleasant 74 year old female with past medical history of type 2 diabetes mellitus, hypertension, dyslipidemia, admitted to the medicine service 01/27/2015 when she presented with complaints of atypical chest pain, found to have an episode of hematemesis in the emergency room that was witnessed by staff. Patient reported taking prednisone on and off for the past year and had recently been on steroids for acute gout. Labs revealed hemoglobin of 13.7. She was placed on IV  Protonix and GI consultation requested.  1. Upper GI bleed -Patient reporting taking steroids on-and-off for the past year and recently placed on a steroid course for acute gout. She is also on aspirin. She was found to have an episode of hematemesis in the emergency room. I suspect upper GI bleed related to ulcer disease which may have been caused by systemic steroids. -On 01/29/2015 she underwent upper endoscopy that showed distal esophageal ulcer with reflux esophagitis. GI recommended PPI BID along with Carafate -By 01/29/2015 she reported feeling well, tolerating PO intake, labs showing Hg of 11.5.   2. Type 2 diabetes mellitus -Will resume her home regimen with Actos 15 mg PO q daily  3. Hypertension. -Blood pressures have been stable, continue Norvasc 5 mg by mouth daily and losartan 100 mg by mouth daily  4. Chest pain. -Patient presenting with atypical chest pain, which I think is likely due to gastritis/ulcers, particularly in setting of upper GI bleed. -She had 2 negative troponins, will pursue GI workup. -Aspirin has been discontinued.   Procedures:  EGD Impression: Distal esophageal ulcer with reflux esophagitis.   Consultations:  GI  Discharge Exam: Filed Vitals:   01/29/15 0546  BP: 133/65  Pulse: 67  Temp: 98.5 F (36.9 C)  Resp: 20    General: Patient is awake and alert, oriented, states feeling better Cardiovascular: RRR normal S1S2 Respiratory: Normal inspiratory effort, lungs are clear Abdomen: Soft, nontender nondistended  Discharge Instructions   Discharge Instructions    Call MD for:  difficulty breathing, headache or visual  disturbances    Complete by:  As directed      Call MD for:  extreme fatigue    Complete by:  As directed      Call MD for:  hives    Complete by:  As directed      Call MD for:  persistant dizziness or light-headedness    Complete by:  As directed      Call MD for:  persistant nausea and vomiting    Complete by:  As  directed      Call MD for:  redness, tenderness, or signs of infection (pain, swelling, redness, odor or green/yellow discharge around incision site)    Complete by:  As directed      Call MD for:  severe uncontrolled pain    Complete by:  As directed      Call MD for:  temperature >100.4    Complete by:  As directed      Diet - low sodium heart healthy    Complete by:  As directed      Increase activity slowly    Complete by:  As directed           Current Discharge Medication List    START taking these medications   Details  pantoprazole (PROTONIX) 40 MG tablet Take 1 tablet (40 mg total) by mouth daily. Qty: 60 tablet, Refills: 1    sucralfate (CARAFATE) 1 G tablet Take 1 tablet (1 g total) by mouth 4 (four) times daily -  with meals and at bedtime. Qty: 90 tablet, Refills: 1      CONTINUE these medications which have NOT CHANGED   Details  allopurinol (ZYLOPRIM) 100 MG tablet Take 1 tablet (100 mg total) by mouth daily. Qty: 30 tablet, Refills: 6   Associated Diagnoses: Gout of big toe    amLODipine (NORVASC) 5 MG tablet Take 1 tablet (5 mg total) by mouth daily. Qty: 90 tablet, Refills: 0   Associated Diagnoses: Essential hypertension, benign    cholecalciferol (VITAMIN D) 1000 UNITS tablet Take 5,000 Units by mouth daily.    FLUoxetine (PROZAC) 20 MG capsule Take 1 capsule (20 mg total) by mouth daily. Qty: 90 capsule, Refills: 1   Associated Diagnoses: Depression    levothyroxine (SYNTHROID, LEVOTHROID) 25 MCG tablet Take 1 tablet (25 mcg total) by mouth daily before breakfast. Qty: 30 tablet, Refills: 2   Associated Diagnoses: Hypothyroidism, unspecified hypothyroidism type    LORazepam (ATIVAN) 1 MG tablet Take 1/2 to 1T po bid PRN anxiety Qty: 20 tablet, Refills: 1   Associated Diagnoses: Depression    losartan (COZAAR) 100 MG tablet Take 1 tablet (100 mg total) by mouth daily. Qty: 30 tablet, Refills: 2   Associated Diagnoses: Essential hypertension,  benign    pioglitazone (ACTOS) 15 MG tablet Take 1 tablet (15 mg total) by mouth daily. Take with largest meal of day. Qty: 90 tablet, Refills: 1   Associated Diagnoses: Type 2 diabetes mellitus with other diabetic kidney complication    rosuvastatin (CRESTOR) 40 MG tablet Take 1 tablet (40 mg total) by mouth daily. Qty: 90 tablet, Refills: 1   Associated Diagnoses: Type 2 diabetes mellitus with other diabetic kidney complication; Hyperlipemia    vitamin B-12 (CYANOCOBALAMIN) 1000 MCG tablet Take 1,000 mcg by mouth daily.    Blood Glucose Monitoring Suppl (ONE TOUCH ULTRA SYSTEM KIT) W/DEVICE KIT 1 kit by Does not apply route once. Qty: 1 each, Refills: 0    fluticasone (FLONASE) 50  MCG/ACT nasal spray 1 spray L nare twice daily. Qty: 16 g, Refills: 6   Associated Diagnoses: Otitis media with effusion, left    glucose blood (FREESTYLE LITE) test strip Use as instructed Qty: 100 each, Refills: 1    ONETOUCH DELICA LANCETS FINE MISC Test twice daily. Qty: 180 each, Refills: 2    traMADol (ULTRAM) 50 MG tablet Take 1 tablet (50 mg total) by mouth 2 (two) times daily as needed. 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 Codeine Phosphate    Follow-up Information    Follow up with WEAVER, LAYNE C, NP.   Specialty:  Nurse Practitioner   Contact information:   [address] [place] [address] [phone]       Follow up with Manus Rudd, MD In 1 week.   Specialty:  Gastroenterology   Contact information:   [address] [place] [address] [phone]        The results of significant diagnostics from  this hospitalization (including imaging, microbiology, ancillary and laboratory) are listed below for reference.    Significant Diagnostic Studies: Dg Chest Port 1 View  01/27/2015   CLINICAL DATA:  Chest pain, neck and face weakness and numbness.  EXAM: PORTABLE CHEST - 1 VIEW  COMPARISON:  November 28, 2009  FINDINGS: The heart size and mediastinal contours are stable. The heart size is enlarged. There is no focal infiltrate, pulmonary edema, or pleural effusion. The visualized skeletal structures are stable.  IMPRESSION: No active cardiopulmonary disease.   Electronically Signed   By: Abelardo Diesel M.D.   On: 01/27/2015 15:41    Microbiology: No results found for this or any previous visit (from the past 240 hour(s)).   Labs: Basic Metabolic Panel:  Recent Labs Lab 01/27/15 1525 01/29/15 0530  NA 137 140  K 3.7 3.8  CL 99* 108  CO2 26 25  GLUCOSE 101* 94  BUN 31* 16  CREATININE 1.33* 1.23*  CALCIUM 9.8 8.4*   Liver Function Tests: No results for input(s): AST, ALT, ALKPHOS, BILITOT, PROT, ALBUMIN in the last 168 hours. No results for input(s): LIPASE, AMYLASE in the last 168 hours. No results for input(s): AMMONIA in the last 168 hours. CBC:  Recent Labs Lab 01/27/15 1525 01/28/15 0509 01/29/15 0530  WBC 10.4 7.3 6.0  NEUTROABS 5.7  --   --   HGB 13.7 12.0 11.5*  HCT 41.0 35.9* 35.0*  MCV 87.4 88.4 89.1  PLT 226 175 166   Cardiac Enzymes:  Recent Labs Lab 01/27/15 1525 01/27/15 2039  TROPONINI <0.03 <0.03   BNP: BNP (last 3 results) No results for input(s): BNP in the last 8760 hours.  ProBNP (last 3 results) No results for input(s): PROBNP in the last 8760 hours.  CBG:  Recent Labs Lab 01/28/15 0725 01/28/15 1118 01/28/15 1823 01/29/15 01/29/15 0545  GLUCAP 80 87 137* 110* 85  SignedKelvin Cellar  [facility] 01/29/2015, 9:08 AM

## 2015-01-29 NOTE — Care Management Note (Signed)
Case Management Note  Patient Details  Name: KEIR FOLAND MRN: [id number] Date of Birth: 15-Jan-1941  Expected Discharge Date:                  Expected Discharge Plan:  Home/Self Care  In-House Referral:  NA  Discharge planning Services  CM Consult  Post Acute Care Choice:  NA Choice offered to:  NA  DME Arranged:    DME Agency:     HH Arranged:    Kenneth:     Status of Service:  Completed, signed off  Medicare Important Message Given:    Date Medicare IM Given:    Medicare IM give by:    Date Additional Medicare IM Given:    Additional Medicare Important Message give by:     If discussed at [place] of Stay Meetings, dates discussed:    Additional Comments: Pt is from home, being discharged back home today. Pt independent and has no CM needs.    Sherald Barge, RN 01/29/2015, 10:51 AM

## 2015-01-29 NOTE — Telephone Encounter (Signed)
Please arrange for patient to be seen in approximately 6 weeks, hospital follow-up, reassess need for dilation electively as outpatient if any dysphagia.

## 2015-01-29 NOTE — Progress Notes (Signed)
Patient discharging home.  IV removed - WNL.  Reviewed medications and instructed on importance of new prescriptions to prevent further GI bleed/irritation.  Instructed to make follow up appt with PCP and GI.  Verbalizes understanding.  No questions at this time, stable to DC home.  Waiting for family to pick up and will be assisted off unit.

## 2015-01-29 NOTE — Telephone Encounter (Signed)
APPOINTMENT MADE AND LETTER SENT °

## 2015-01-29 NOTE — Telephone Encounter (Signed)
LMOVM asking patient to call back so I can know how many times she tests her blood sugars a day.

## 2015-01-30 ENCOUNTER — Encounter (HOSPITAL_COMMUNITY): Payer: Self-pay | Admitting: Internal Medicine

## 2015-01-30 ENCOUNTER — Telehealth: Payer: Self-pay | Admitting: Internal Medicine

## 2015-01-30 MED ORDER — GLUCOSE BLOOD VI STRP: ORAL_STRIP | Status: DC

## 2015-01-30 NOTE — Telephone Encounter (Signed)
Manuela Schwartz, please see Anna's note.

## 2015-01-30 NOTE — Telephone Encounter (Signed)
Patient called the office this morning to say that her discharge papers say that she needs to see RMR in one week. She had an EGD on Sunday while at [facility]. In AS notes she is to follow up in 6 weeks as a HOS follow up and has OV made with [place] on 6/30 at 8 with AS. Do we need to bring patient in sooner? Patient is confused by what her DC papers say. I told her if RMR felt she needed to be seen sooner he would let us now and I would call her back. She agreed. Please advise.

## 2015-01-30 NOTE — Telephone Encounter (Signed)
1 week is too soon. I would say 4 weeks.

## 2015-01-30 NOTE — Telephone Encounter (Signed)
Elizabeth Mcintyre, pts discharge paperwork from the hospital do say for pt to have an ov here in 1 week. When does she need to follow up with [place]?

## 2015-01-31 ENCOUNTER — Encounter: Payer: Self-pay | Admitting: Internal Medicine

## 2015-01-31 NOTE — Telephone Encounter (Signed)
I have changed OV from 6/30 to 6/16 and mailed patient an appointment letter.

## 2015-02-23 ENCOUNTER — Ambulatory Visit (INDEPENDENT_AMBULATORY_CARE_PROVIDER_SITE_OTHER): Payer: Medicare Other | Admitting: Nurse Practitioner

## 2015-02-23 ENCOUNTER — Encounter: Payer: Self-pay | Admitting: Nurse Practitioner

## 2015-02-23 VITALS — BP 163/84 | HR 76 | Temp 98.8°F | Resp 16 | Ht 62.5 in | Wt 161.0 lb

## 2015-02-23 DIAGNOSIS — F32A Depression, unspecified: Secondary | ICD-10-CM

## 2015-02-23 DIAGNOSIS — E039 Hypothyroidism, unspecified: Secondary | ICD-10-CM

## 2015-02-23 DIAGNOSIS — R112 Nausea with vomiting, unspecified: Secondary | ICD-10-CM

## 2015-02-23 DIAGNOSIS — E119 Type 2 diabetes mellitus without complications: Secondary | ICD-10-CM

## 2015-02-23 DIAGNOSIS — R7989 Other specified abnormal findings of blood chemistry: Secondary | ICD-10-CM | POA: Diagnosis not present

## 2015-02-23 DIAGNOSIS — F329 Major depressive disorder, single episode, unspecified: Secondary | ICD-10-CM

## 2015-02-23 DIAGNOSIS — K922 Gastrointestinal hemorrhage, unspecified: Secondary | ICD-10-CM | POA: Diagnosis not present

## 2015-02-23 DIAGNOSIS — M1000 Idiopathic gout, unspecified site: Secondary | ICD-10-CM

## 2015-02-23 DIAGNOSIS — E559 Vitamin D deficiency, unspecified: Secondary | ICD-10-CM | POA: Diagnosis not present

## 2015-02-23 DIAGNOSIS — M109 Gout, unspecified: Secondary | ICD-10-CM

## 2015-02-23 DIAGNOSIS — I10 Essential (primary) hypertension: Secondary | ICD-10-CM

## 2015-02-23 LAB — COMPREHENSIVE METABOLIC PANEL
ALT: 17 U/L (ref 0–35)
AST: 19 U/L (ref 0–37)
Albumin: 4.4 g/dL (ref 3.5–5.2)
Alkaline Phosphatase: 69 U/L (ref 39–117)
BUN: 14 mg/dL (ref 6–23)
CO2: 25 meq/L (ref 19–32)
Calcium: 9.5 mg/dL (ref 8.4–10.5)
Chloride: 105 mEq/L (ref 96–112)
Creatinine, Ser: 1.38 mg/dL — ABNORMAL HIGH (ref 0.40–1.20)
GFR: 39.85 mL/min — ABNORMAL LOW (ref >60.00)
GLUCOSE: 124 mg/dL — AB (ref 70–99)
Potassium: 3.7 mEq/L (ref 3.5–5.1)
SODIUM: 139 meq/L (ref 135–145)
Total Bilirubin: 0.6 mg/dL (ref 0.2–1.2)
Total Protein: 6.7 g/dL (ref 6.0–8.3)

## 2015-02-23 LAB — VITAMIN D 25 HYDROXY (VIT D DEFICIENCY, FRACTURES): VITD: 31.03 ng/mL (ref 30.00–100.00)

## 2015-02-23 LAB — HEMOGLOBIN A1C: Hgb A1c MFr Bld: 6 % (ref 4.6–6.5)

## 2015-02-23 LAB — LIPID PANEL
CHOL/HDL RATIO: 3
Cholesterol: 161 mg/dL (ref 0–200)
HDL: 46.9 mg/dL (ref >39.00)
NONHDL: 114.1
Triglycerides: 243 mg/dL — ABNORMAL HIGH (ref 0.0–149.0)
VLDL: 48.6 mg/dL — AB (ref 0.0–40.0)

## 2015-02-23 LAB — TSH: TSH: 3.27 u[IU]/mL (ref 0.35–4.50)

## 2015-02-23 LAB — CBC
HCT: 36.7 % (ref 36.0–46.0)
Hemoglobin: 12.4 g/dL (ref 12.0–15.0)
MCHC: 33.7 g/dL (ref 30.0–36.0)
MCV: 86.4 fl (ref 78.0–100.0)
Platelets: 197 10*3/uL (ref 150.0–400.0)
RBC: 4.24 Mil/uL (ref 3.87–5.11)
RDW: 14 % (ref 11.5–15.5)
WBC: 7.3 10*3/uL (ref 4.0–10.5)

## 2015-02-23 LAB — LDL CHOLESTEROL, DIRECT: Direct LDL: 75 mg/dL

## 2015-02-23 LAB — URIC ACID: Uric Acid, Serum: 5.2 mg/dL (ref 2.4–7.0)

## 2015-02-23 MED ORDER — ALLOPURINOL 100 MG PO TABS: 100.0000 mg | ORAL_TABLET | Freq: Every day | ORAL | Status: DC

## 2015-02-23 MED ORDER — LOSARTAN POTASSIUM 100 MG PO TABS: 100.0000 mg | ORAL_TABLET | Freq: Every day | ORAL | Status: DC

## 2015-02-23 MED ORDER — FLUOXETINE HCL (PMDD) 20 MG PO CAPS: 1.0000 | ORAL_CAPSULE | Freq: Every day | ORAL | Status: DC

## 2015-02-23 MED ORDER — ONDANSETRON 8 MG PO TBDP: 8.0000 mg | ORAL_TABLET | Freq: Two times a day (BID) | ORAL | Status: DC | PRN

## 2015-02-23 NOTE — Assessment & Plan Note (Signed)
Daily epigastric pain, intermittent, bloating, burping, nausea Add probiotic qd, zofran PRN nausea No NSAIDS, prednisone, ASA, ETOH, limit coffee Keep appt w/Dr Sydell Axon in 2 wks CBC, CMET

## 2015-02-23 NOTE — Progress Notes (Signed)
Subjective:     Elizabeth Mcintyre is a 74 y.o. female presents for f/u htn, gout, DM and review of recent hospitalization for upper GI bleed. Htn: good control. No intol SE meds. RF BMI, age, sedentary. Discussed benefit of exercise.  Gout: last flare 1 mo ago: Dveloped stomach pain during 2nd wk of prednisone (GI bleed). Continues on allopurinol 174ms. No pain in 1 mo. Will get xray of foot if another flare. DM: last A1c under 6.0 so decreased actos to 15 mg from 30 mg qd. Home BS range 170-107. No sympotms low BS. A1C today. recent hospitalization for upper GI bleed:Distal esophageal ulcer consistent with reflux esophagitis. Schatzki's ring present, per Dr Rourke's notes. Pt started on carafate & PPI. Prednsisone & ASA stopped. Today c/o epigastric pain, bloating, gas, burping, nausea. Discussed avoid all NSAIDS, prednisone, limit coffee.   The following portions of the patient's history were reviewed and updated as appropriate: allergies, current medications, past medical history, past social history, past surgical history and problem list.  Review of Systems Constitutional: negative for fatigue and fevers Cardiovascular: negative for exertional chest pressure/discomfort, irregular heart beat and lower extremity edema Gastrointestinal: negative for diarrhea    Objective:    BP 163/84 mmHg  Pulse 76  Temp(Src) 98.8 F (37.1 C) (Temporal)  Resp 16  Ht 5' 2.5" (1.588 m)  Wt 161 lb (73.029 kg)  BMI 28.96 kg/m2  SpO2 99% BP 163/84 mmHg  Pulse 76  Temp(Src) 98.8 F (37.1 C) (Temporal)  Resp 16  Ht 5' 2.5" (1.588 m)  Wt 161 lb (73.029 kg)  BMI 28.96 kg/m2  SpO2 99% General appearance: alert, cooperative, appears stated age and no distress Head: Normocephalic, without obvious abnormality, atraumatic Eyes: negative findings: lids and lashes normal and conjunctivae and sclerae normal Lungs: clear to auscultation bilaterally Heart: regular rate and rhythm, S1, S2 normal, no murmur,  click, rub or gallop Abdomen: normal findings: no masses palpable and no organomegaly and abnormal findings:  frequent burping, epigastric tenderness Extremities: R foot: no swelling or erythema Neurologic: Grossly normal    Assessment:Plan     1. Type 2 diabetes mellitus without complication improved - CBC - Comprehensive metabolic panel - Hemoglobin A1c - Lipid panel  2. Nausea and vomiting, vomiting of unspecified type new - ondansetron (ZOFRAN-ODT) 8 MG disintegrating tablet; Take 1 tablet (8 mg total) by mouth 2 (two) times daily as needed for nausea or vomiting.  Dispense: 20 tablet; Refill: 0  3. Gout of big toe stable - Uric acid - allopurinol (ZYLOPRIM) 100 MG tablet; Take 1 tablet (100 mg total) by mouth daily.  Dispense: 30 tablet; Refill: 5   4. Essential hypertension, benign No change - losartan (COZAAR) 100 MG tablet; Take 1 tablet (100 mg total) by mouth daily.  Dispense: 30 tablet; Refill: 2  5. Upper GI bleed F/u Dr Sydell Axon - CBC  See prob list for A&P See pt instructions F/u 3 mos-dm, lipids, htn, depression, abd pain

## 2015-02-23 NOTE — Assessment & Plan Note (Signed)
A1C today Get back to walking

## 2015-02-23 NOTE — Assessment & Plan Note (Signed)
No flare in 1 mo. Continue 100 mg allopurinol Will have to use colchicine next flare Will get xray of foot if another flare

## 2015-02-23 NOTE — Assessment & Plan Note (Addendum)
Good control No changes Start walking again-30 mins qd

## 2015-02-23 NOTE — Progress Notes (Signed)
Pre visit review using our clinic review tool, if applicable. No additional management support is needed unless otherwise documented below in the visit note. 

## 2015-02-23 NOTE — Patient Instructions (Addendum)
Start probiotic. Take daily for 3 months. See handout for best types. Unfortunately insurance will not cover.  When nauseous, eat vanilla yogurt. If that does not help, take ondansetron.   Eliminate dairy other than yogurt. Use almond milk, almond coffee dreamer.  Limit coffee intake. Get low acid coffee at [facility]. Eat spoonful yogurt before drinking coffee.  No aspirin, advil, aleve, motrin, goody powder, BC powder, ibuprophen, naproxyn, mobic. Only take tylenol or tramadol if pain.  Get back to walking every day-15 to 30 minutes.  Keep appointment with Dr Sydell Axon.     My office will call with lab results.  Plan on seeing me in 3 months. Nice to see you!

## 2015-02-27 ENCOUNTER — Telehealth: Payer: Self-pay | Admitting: Nurse Practitioner

## 2015-02-27 DIAGNOSIS — I10 Essential (primary) hypertension: Secondary | ICD-10-CM

## 2015-02-27 DIAGNOSIS — E559 Vitamin D deficiency, unspecified: Secondary | ICD-10-CM

## 2015-02-27 MED ORDER — VITAMIN D3 1.25 MG (50000 UT) PO CAPS: 1.0000 | ORAL_CAPSULE | ORAL | Status: DC

## 2015-02-27 MED ORDER — AMLODIPINE BESYLATE 10 MG PO TABS: 10.0000 mg | ORAL_TABLET | Freq: Every day | ORAL | Status: DC

## 2015-02-27 NOTE — Telephone Encounter (Signed)
Pharmacy called and they have received a RX for Fruma for Vit D 3 5000mg . They are questioning this order. Please call them at [phone].

## 2015-02-27 NOTE — Telephone Encounter (Signed)
Vit D def-start prescription strength D Creat creeping up again 1.38. BP goal is 135/85. Will increase norvasc to 10 mg qd. OV in 2 weeks. A1C 6.0. No change in meds. Monitor for CHF. Pt advised to weigh daily-if wt changes more than 2 lbs in 1 day, she is to call. TSH over 3-advised to take med on epmty stomach-when goes to broom at night. No change crestor. Discussed all w/PT  Answered all qtns.

## 2015-02-27 NOTE — Telephone Encounter (Signed)
Spoke to pharmacy.

## 2015-03-01 ENCOUNTER — Ambulatory Visit: Payer: Medicare Other | Admitting: Gastroenterology

## 2015-03-02 ENCOUNTER — Other Ambulatory Visit: Payer: Self-pay | Admitting: Physician Assistant

## 2015-03-06 ENCOUNTER — Ambulatory Visit (INDEPENDENT_AMBULATORY_CARE_PROVIDER_SITE_OTHER): Payer: Medicare Other | Admitting: Gastroenterology

## 2015-03-06 ENCOUNTER — Encounter: Payer: Self-pay | Admitting: Gastroenterology

## 2015-03-06 VITALS — BP 135/77 | HR 82 | Temp 97.7°F | Ht 62.0 in | Wt 160.0 lb

## 2015-03-06 DIAGNOSIS — K2100 Gastro-esophageal reflux disease with esophagitis, without bleeding: Secondary | ICD-10-CM

## 2015-03-06 DIAGNOSIS — R1013 Epigastric pain: Secondary | ICD-10-CM | POA: Diagnosis not present

## 2015-03-06 HISTORY — DX: Gastro-esophageal reflux disease with esophagitis, without bleeding: K21.00

## 2015-03-06 MED ORDER — PANTOPRAZOLE SODIUM 40 MG PO TBEC: 40.0000 mg | DELAYED_RELEASE_TABLET | Freq: Two times a day (BID) | ORAL | Status: DC

## 2015-03-06 NOTE — Patient Instructions (Addendum)
I have increased Protonix to twice a day, 30 minutes before breakfast and dinner.   Take Zofran as needed for nausea. It may be helpful to take scheduled with meals, three times a day. Let me know if you need a refill of this.   I have provided 2 diet handouts for slow stomach emptying (which may be going on with you due to your history of diabetes) and reflux diet.   Start taking a probiotic each day. Examples over the counter are Restora, Align, Philip's Colon Health, Digestive Advantage.   We will call in 10 days to get a progress report. If you aren't better, we need to proceed with an upper endoscopy and dilation.   Food Choices for Gastroesophageal Reflux Disease When you have gastroesophageal reflux disease (GERD), the foods you eat and your eating habits are very important. Choosing the right foods can help ease the discomfort of GERD. WHAT GENERAL GUIDELINES DO I NEED TO FOLLOW?  Choose fruits, vegetables, whole grains, low-fat dairy products, and low-fat meat, fish, and poultry.  Limit fats such as oils, salad dressings, butter, nuts, and avocado.  Keep a food diary to identify foods that cause symptoms.  Avoid foods that cause reflux. These may be different for different people.  Eat frequent small meals instead of three large meals each day.  Eat your meals slowly, in a relaxed setting.  Limit fried foods.  Cook foods using methods other than frying.  Avoid drinking alcohol.  Avoid drinking large amounts of liquids with your meals.  Avoid bending over or lying down until 2-3 hours after eating. WHAT FOODS ARE NOT RECOMMENDED? The following are some foods and drinks that may worsen your symptoms: Vegetables Tomatoes. Tomato juice. Tomato and spaghetti sauce. Chili peppers. Onion and garlic. Horseradish. Fruits Oranges, grapefruit, and lemon (fruit and juice). Meats High-fat meats, fish, and poultry. This includes hot dogs, ribs, ham, sausage, salami, and  bacon. Dairy Whole milk and chocolate milk. Sour cream. Cream. Butter. Ice cream. Cream cheese.  Beverages Coffee and tea, with or without caffeine. Carbonated beverages or energy drinks. Condiments Hot sauce. Barbecue sauce.  Sweets/Desserts Chocolate and cocoa. Donuts. Peppermint and spearmint. Fats and Oils High-fat foods, including [place] fries and potato chips. Other Vinegar. Strong spices, such as black pepper, white pepper, red pepper, cayenne, curry powder, cloves, ginger, and chili powder. The items listed above may not be a complete list of foods and beverages to avoid. Contact your dietitian for more information. Document Released: 09/01/2005 Document Revised: 09/06/2013 Document Reviewed: 07/06/2013 [facility] Patient Information 2015 [place], [place]. This information is not intended to replace advice given to you by your health care provider. Make sure you discuss any questions you have with your health care provider. Gastroparesis  Gastroparesis is also called slowed stomach emptying (delayed gastric emptying). It is a condition in which the stomach takes too long to empty its contents. It often happens in people with diabetes.  CAUSES  Gastroparesis happens when nerves to the stomach are damaged or stop working. When the nerves are damaged, the muscles of the stomach and intestines do not work normally. The movement of food is slowed or stopped. High blood glucose (sugar) causes changes in nerves and can damage the blood vessels that carry oxygen and nutrients to the nerves. RISK FACTORS  Diabetes.  Post-viral syndromes.  Eating disorders (anorexia, bulimia).  Surgery on the stomach or vagus nerve.  Gastroesophageal reflux disease (rarely).  Smooth muscle disorders (amyloidosis, scleroderma).  Metabolic disorders, including  hypothyroidism.  Parkinson disease. SYMPTOMS   Heartburn.  Feeling sick to your stomach (nausea).  Vomiting of undigested food.  An early  feeling of fullness when eating.  Weight loss.  Abdominal bloating.  Erratic blood glucose levels.  Lack of appetite.  Gastroesophageal reflux.  Spasms of the stomach wall. Complications can include:  Bacterial overgrowth in stomach. Food stays in the stomach and can ferment and cause bacteria to grow.  Weight loss due to difficulty digesting and absorbing nutrients.  Vomiting.  Obstruction in the stomach. Undigested food can harden and cause nausea and vomiting.  Blood glucose fluctuations caused by inconsistent food absorption. DIAGNOSIS  The diagnosis of gastroparesis is confirmed through one or more of the following tests:  Barium X-rays and scans. These tests look at how long it takes for food to move through the stomach.  Gastric manometry. This test measures electrical and muscular activity in the stomach. A thin tube is passed down the throat into the stomach. The tube contains a wire that takes measurements of the stomach's electrical and muscular activity as it digests liquids and solid food.  Endoscopy. This procedure is done with a long, thin tube called an endoscope. It is passed through the mouth and gently down the esophagus into the stomach. This tube helps the caregiver look at the lining of the stomach to check for any abnormalities.  Ultrasonography. This can rule out gallbladder disease or pancreatitis. This test will outline and define the shape of the gallbladder and pancreas. TREATMENT   Treatments may include:  Exercise.  Medicines to control nausea and vomiting.  Medicines to stimulate stomach muscles.  Changes in what and when you eat.  Having smaller meals more often.  Eating low-fiber forms of high-fiber foods, such as eating cooked vegetables instead of raw vegetables.  Eating low-fat foods.  Consuming liquids, which are easier to digest.  In severe cases, feeding tubes and intravenous (IV) feeding may be needed. It is important to  note that in most cases, treatment does not cure gastroparesis. It is usually a lasting (chronic) condition. Treatment helps you manage the underlying condition so that you can be as healthy and comfortable as possible. Other treatments  A gastric neurostimulator has been developed to assist people with gastroparesis. The battery-operated device is surgically implanted. It emits mild electrical pulses to help improve stomach emptying and to control nausea and vomiting.  The use of botulinum toxin has been shown to improve stomach emptying by decreasing the prolonged contractions of the muscle between the stomach and the small intestine (pyloric sphincter). The benefits are temporary. SEEK MEDICAL CARE IF:   You have diabetes and you are having problems keeping your blood glucose in goal range.  You are having nausea, vomiting, bloating, or early feelings of fullness with eating.  Your symptoms do not change with a change in diet. Document Released: 09/01/2005 Document Revised: 12/27/2012 Document Reviewed: 02/08/2009 [facility] Patient Information 2015 [place], [place]. This information is not intended to replace advice given to you by your health care provider. Make sure you discuss any questions you have with your health care provider.

## 2015-03-06 NOTE — Progress Notes (Signed)
Referring Provider: Irene Pap, NP Primary Care Physician:  Elizabeth Pap, NP  Primary GI: (Previously Dr. Olevia Mcintyre) Dr. Gala Mcintyre   Chief Complaint  Patient presents with  . Follow-up    HPI:   Elizabeth Mcintyre is a 74 y.o. female presenting today with a history of recent hospital admission secondary to nausea chest discomfort, vomiting gross blood in the ED. EGD performed during hospitalization by Dr. Gala Mcintyre with reflux esophagitis and Schatzki's ring not manipulated due to recent bleeding. Presents today to discuss need for elective EGD with dilation.  States food will sit heavy at times in esophagus and sometimes has to burp up food or liquids. Sometimes with eating notes upper abdominal discomfort, like a "soreness". Associated nausea. Prescribed Zofran and has only taken it once. Notes early satiety. Epigastric pain for several weeks. US abdomen in Feb 2016 was normal.    Previously seen by Dr. Olevia Mcintyre in [place] with last colonoscopy in May 2012 by Dr. Olevia Mcintyre with mild diverticulosis, otherwise normal. Due for repeat colonoscopy in 10 years.   Past Medical History  Diagnosis Date  . Diabetes mellitus     type 2  . Hyperlipidemia   . Hypertension   . Asthma   . NECK AND BACK PAIN 11/26/2010  . HYPERTENSION 11/09/2007  . HYPERLIPIDEMIA 11/09/2007  . HIP PAIN, RIGHT 11/26/2010  . HEMATURIA UNSPECIFIED 11/26/2010  . Vaginitis 01/01/2011  . ASTHMA 11/09/2007  . ALLERGIC RHINITIS CAUSE UNSPECIFIED 11/26/2010  . DIABETES MELLITUS, TYPE II 11/09/2007  . 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 Lymphadenopathy 04/03/2011  . Diverticulosis 03/2011  . Fatigue 07/22/2011  . Atypical chest pain 07/22/2011  . Sinusitis acute 12/05/2011  . Visual disturbance of one eye 12/05/2011  . Stress incontinence   . History of rheumatoid arthritis     during 30's, was treated.  . Depression     Past Surgical History  Procedure Laterality Date  . Abdominal hysterectomy    . Cardiac catheterization       X 2, last one in 1998  . Colonoscopy    . Colonoscopy  May 2012    Dr. Olevia Mcintyre: mild diverticulosis, otherwise normal.   . Esophagogastroduodenoscopy N/A 01/28/2015    Dr. Gala Mcintyre: reflux esophagitis, Schatzki's ring not manipulated due to recent bleeding    Current Outpatient Prescriptions  Medication Sig Dispense Refill  . allopurinol (ZYLOPRIM) 100 MG tablet Take 1 tablet (100 mg total) by mouth daily. 30 tablet 5  . amLODipine (NORVASC) 10 MG tablet Take 1 tablet (10 mg total) by mouth daily. 30 tablet 0  . Blood Glucose Monitoring Suppl (ONE TOUCH ULTRA SYSTEM KIT) W/DEVICE KIT 1 kit by Does not apply route once. 1 each 0  . Cholecalciferol (VITAMIN D3) 50000 UNITS CAPS Take 1 capsule by mouth every 7 (seven) days. 12 capsule 0  . Fluoxetine HCl, PMDD, 20 MG CAPS Take 1 capsule (20 mg total) by mouth daily. 30 each 2  . fluticasone (FLONASE) 50 MCG/ACT nasal spray 1 spray L nare twice daily. (Patient taking differently: Place 1 spray into both nostrils daily as needed for allergies. ) 16 g 6  . glucose blood (ONE TOUCH ULTRA TEST) test strip Use 1 test strip to test blood sugar levels 3 times daily 100 each 12  . levothyroxine (SYNTHROID, LEVOTHROID) 25 MCG tablet Take 1 tablet (25 mcg total) by mouth daily before breakfast. 30 tablet 2  . LORazepam (ATIVAN) 1 MG tablet Take 1/2 to 1T po bid PRN  anxiety (Patient taking differently: Take 0.5-1 mg by mouth 2 (two) times daily as needed for anxiety. ) 20 tablet 1  . losartan (COZAAR) 100 MG tablet Take 1 tablet (100 mg total) by mouth daily. 30 tablet 2  . ondansetron (ZOFRAN-ODT) 8 MG disintegrating tablet Take 1 tablet (8 mg total) by mouth 2 (two) times daily as needed for nausea or vomiting. 20 tablet 0  . ONETOUCH DELICA LANCETS FINE MISC Test twice daily. 180 each 2  . pantoprazole (PROTONIX) 40 MG tablet Take 1 tablet (40 mg total) by mouth 2 (two) times daily before a meal. 60 tablet 3  . pioglitazone (ACTOS) 15 MG tablet Take 1  tablet (15 mg total) by mouth daily. Take with largest meal of day. 90 tablet 1  . rosuvastatin (CRESTOR) 40 MG tablet Take 1 tablet (40 mg total) by mouth daily. 90 tablet 1  . sucralfate (CARAFATE) 1 G tablet Take 1 tablet (1 g total) by mouth 4 (four) times daily -  with meals and at bedtime. 90 tablet 1  . traMADol (ULTRAM) 50 MG tablet Take 1 tablet (50 mg total) by mouth 2 (two) times daily as needed. Take 1T po qhs X 3d, then take 1 T po bid 60 tablet 1  . vitamin B-12 (CYANOCOBALAMIN) 1000 MCG tablet Take 1,000 mcg by mouth daily.     No current facility-administered medications for this visit.    Allergies as of 03/06/2015 - Review Complete 03/06/2015  Allergen Reaction Noted  . Codeine phosphate      Family History  Problem Relation Age of Onset  . Heart disease Mother   . Osteoarthritis Mother   . Sudden death Father   . Single kidney Father   . Other Father     h/o severe MVA injuries  . Hyperlipidemia Sister   . Other Daughter     Myalgias  . Fibromyalgia Daughter   . Allergies Daughter   . Heart disease Maternal Grandfather   . Sudden death Paternal Grandmother   . Diabetes Paternal Grandfather   . Heart disease Daughter   . Other Daughter     palpitations  . Pulmonary fibrosis Maternal Aunt   . Cancer Paternal Uncle   . Pulmonary fibrosis Maternal Aunt   . Colon cancer Neg Hx     History   Social History  . Marital Status: Widowed    Spouse Name: N/A  . Number of Children: 2  . Years of Education: N/A   Occupational History  . retired    Social History Main Topics  . Smoking status: Never Smoker   . Smokeless tobacco: Never Used  . Alcohol Use: No  . Drug Use: No  . Sexual Activity: No   Other Topics Concern  . None   Social History Narrative   [name] is widowed. Her young grandson lives with her, for whom she shares custody with her daughter.      Review of Systems: As mentioned in HPI  Physical Exam: BP 135/77 mmHg  Pulse 82   Temp(Src) 97.7 F (36.5 C)  Ht _0  (1.575 m)  Wt 160 lb (72.576 kg)  BMI 29.26 kg/m2 General:   Alert and oriented. No distress noted. Pleasant and cooperative.  Head:  Normocephalic and atraumatic. Eyes:  Conjuctiva clear without scleral icterus. Mouth:  Oral mucosa pink and moist. Good dentition. No lesions. Heart:  S1, S2 present without murmurs, rubs, or gallops. Regular rate and rhythm. Abdomen:  +BS, soft, mild  epigastric discomfort and mild chronic RLQ discomfort with palpation and non-distended.  Msk:  Symmetrical without gross deformities. Normal posture. Extremities:  Without edema. Neurologic:  Alert and  oriented x4;  grossly normal neurologically. Psych:  Alert and cooperative. Normal mood and affect.

## 2015-03-07 NOTE — Assessment & Plan Note (Signed)
Vague upper abdominal soreness, nausea, early satiety. EGD on file. US abdomen Feb 2016 normal. In setting of diabetes, unable to exclude delayed gastric emptying. Doubt biliary etiology. PPI has been increased to BID and Zofran prn. If persistent, consider GES. May ultimately need HIDA to evaluate biliary component if persistent pain. PR in 10 days.

## 2015-03-07 NOTE — Assessment & Plan Note (Signed)
74 year old female with recent hospital admission with nausea, hematemesis, and found to have reflux esophagitis and Schatzki's ring not manipulated. Vague upper abdominal discomfort, food "sitting heavy" in esophagus and regurgitation. Would recommend elective EGD with dilation, but patient would like to hold off on this currently. She is willing to trial Protonix twice a day, GERD diet, and progress report in 10 days. At that time, if symptoms are not better, I would recommend an EGD. Again, she was counseled on the need to schedule now, but she would like to postpone and try this intervention first.

## 2015-03-07 NOTE — Progress Notes (Signed)
cc'd to pcp 

## 2015-03-14 ENCOUNTER — Encounter: Payer: Self-pay | Admitting: Nurse Practitioner

## 2015-03-14 ENCOUNTER — Ambulatory Visit (INDEPENDENT_AMBULATORY_CARE_PROVIDER_SITE_OTHER): Payer: Medicare Other | Admitting: Nurse Practitioner

## 2015-03-14 VITALS — BP 115/75 | HR 84 | Temp 98.8°F | Resp 16 | Ht 62.5 in | Wt 160.0 lb

## 2015-03-14 DIAGNOSIS — I10 Essential (primary) hypertension: Secondary | ICD-10-CM

## 2015-03-14 DIAGNOSIS — K5900 Constipation, unspecified: Secondary | ICD-10-CM | POA: Diagnosis not present

## 2015-03-14 DIAGNOSIS — R351 Nocturia: Secondary | ICD-10-CM

## 2015-03-14 DIAGNOSIS — E118 Type 2 diabetes mellitus with unspecified complications: Secondary | ICD-10-CM

## 2015-03-14 DIAGNOSIS — R1013 Epigastric pain: Secondary | ICD-10-CM | POA: Diagnosis not present

## 2015-03-14 DIAGNOSIS — E119 Type 2 diabetes mellitus without complications: Secondary | ICD-10-CM | POA: Insufficient documentation

## 2015-03-14 NOTE — Assessment & Plan Note (Signed)
Decrease actos to qod. Continue to check home BS A1C in September

## 2015-03-14 NOTE — Patient Instructions (Signed)
Decrease actos to every other day. Continue to check blood sugar.   Be aggressive with bowel movements. Take capful miralax with 8 oz water every hour up to 4 doses in 24 hours. If no bowel movement start again next day.  Continue with GI instructions & follow up as instructed.  Monitor weight daily. If it changes more than 2 pounds let us know.  Follow up in September to review diabetes.  It has been a pleasure to care for you!

## 2015-03-14 NOTE — Progress Notes (Signed)
Pre visit review using our clinic review tool, if applicable. No additional management support is needed unless otherwise documented below in the visit note. 

## 2015-03-14 NOTE — Assessment & Plan Note (Signed)
Good control, continue current meds.

## 2015-03-14 NOTE — Progress Notes (Signed)
Subjective:     Elizabeth Mcintyre is a 74 y.o. female c/o selling & erythema of LE yesterday, multiple urinations through night (7-8), generally didn't feel well. Feels better today, leg swelling resolved, erythema better. Continues to have upper abd/chest pain accompanied by bloating-discussed this w/GI last week-protonix was increased to bid & esophageal stretching recommended. She will f/u w/GI next week. Denies SOB. BS are stable-FS usually around 100. No lows under 80.Last A1C 6.0 Taking 15 mg actos qd. No hx CHF, but ECGs consistently show changes indicating MI in past. She has been evaluated by cardiology-last Dr Percival Spanish 2014. Planned treadmill test, but note indicates he didn't think her symptoms were cardiac. Pt did not have test & no f/u. She is weighing daily-no changes.    Reports constipation-small BM yesterday, had not had BM in 3-4 days. Not using miralax.  The following portions of the patient's history were reviewed and updated as appropriate: allergies, current medications, past medical history, past social history, past surgical history and problem list.  Review of Systems Pertinent items are noted in HPI.    Objective:    BP 115/75 mmHg  Pulse 84  Temp(Src) 98.8 F (37.1 C) (Temporal)  Resp 16  Ht 5' 2.5" (1.588 m)  Wt 160 lb (72.576 kg)  BMI 28.78 kg/m2  SpO2 100% BP 115/75 mmHg  Pulse 84  Temp(Src) 98.8 F (37.1 C) (Temporal)  Resp 16  Ht 5' 2.5" (1.588 m)  Wt 160 lb (72.576 kg)  BMI 28.78 kg/m2  SpO2 100% General appearance: alert, cooperative, appears stated age and no distress Eyes: negative findings: lids and lashes normal and conjunctivae and sclerae normal Lungs: clear to auscultation bilaterally Heart: regular rate and rhythm, S1, S2 normal, no murmur, click, rub or gallop Extremities: edema none Pulses: 2+ and symmetric Skin: petechiae noted bilat LE, L>R. Pt says it is better today  Neurologic: Grossly normal    Assessment:Plan     1. Essential  hypertension No changes  2. Constipation, unspecified constipation type miralax as directed  3. Type 2 diabetes mellitus with complication Decrease actos to 15 mg qod Monitor for CHF  4. Abdominal pain, epigastric Follow GI  5. Nocturia DD: CHF Salt restriction  F/u 3 mos-A1C, TSH, CMET

## 2015-03-15 ENCOUNTER — Ambulatory Visit: Payer: Medicare Other | Admitting: Gastroenterology

## 2015-03-15 ENCOUNTER — Telehealth: Payer: Self-pay

## 2015-03-15 NOTE — Telephone Encounter (Signed)
Called pt for PR per Laban Emperor, NP. [facility] for a return call.

## 2015-03-16 NOTE — Telephone Encounter (Signed)
Pt called and said she is not much better. She will go a few days and do very well. The she will have intermittent abdominal pain ( more epigastric). When she eats it hurts and sometimes the food still trys to come back up. Liquids are worse than solid foods.  Please advise!

## 2015-03-20 ENCOUNTER — Other Ambulatory Visit: Payer: Self-pay

## 2015-03-20 DIAGNOSIS — R1013 Epigastric pain: Secondary | ICD-10-CM

## 2015-03-20 DIAGNOSIS — K219 Gastro-esophageal reflux disease without esophagitis: Secondary | ICD-10-CM

## 2015-03-20 NOTE — Telephone Encounter (Signed)
Thanks for the update. When I saw her late June, she wanted to hold off on repeat EGD with dilation. I think we need to set this up with Dr. Gala Romney. She had a Schatzki's ring not manipulated during hospital admission, and she likely needs dilation.   Please arrange for EGD with dilation with Dr. Gala Romney.

## 2015-03-20 NOTE — Progress Notes (Signed)
Called patient for update. Still with problems of regurgitation.  When I saw her late June, she wanted to hold off on repeat EGD with dilation. I think we need to set this up with Dr. Gala Romney. She had a Schatzki's ring not manipulated during hospital admission, and she likely needs dilation.   Please arrange for EGD with dilation with Dr. Gala Romney.

## 2015-03-20 NOTE — Progress Notes (Signed)
Called pt and LMOM to call us back to set up procedure.

## 2015-03-20 NOTE — Telephone Encounter (Signed)
LMOM to call.

## 2015-03-20 NOTE — Progress Notes (Signed)
Pt called back and she is set for 03/30/2015 @ 1045am. Mailed instructions

## 2015-03-22 NOTE — Telephone Encounter (Signed)
PT has been scheduled for EGD with Dilation with Dr. Gala Romney on 03/30/2015 by Freida Busman.

## 2015-03-30 ENCOUNTER — Encounter (HOSPITAL_COMMUNITY)
Admission: RE | Disposition: A | Discharge status: Home / Self Care | Payer: Self-pay | Source: Ambulatory Visit | Attending: Internal Medicine

## 2015-03-30 ENCOUNTER — Encounter (HOSPITAL_COMMUNITY): Payer: Self-pay | Admitting: *Deleted

## 2015-03-30 ENCOUNTER — Ambulatory Visit (HOSPITAL_COMMUNITY)
Admission: RE | Admit: 2015-03-30 | Discharge: 2015-03-30 | Disposition: A | Discharge status: Home / Self Care | Payer: Medicare Other | Source: Ambulatory Visit | Attending: Internal Medicine | Admitting: Internal Medicine

## 2015-03-30 DIAGNOSIS — Z79899 Other long term (current) drug therapy: Secondary | ICD-10-CM | POA: Insufficient documentation

## 2015-03-30 DIAGNOSIS — F418 Other specified anxiety disorders: Secondary | ICD-10-CM | POA: Diagnosis not present

## 2015-03-30 DIAGNOSIS — K219 Gastro-esophageal reflux disease without esophagitis: Secondary | ICD-10-CM | POA: Diagnosis not present

## 2015-03-30 DIAGNOSIS — R1013 Epigastric pain: Secondary | ICD-10-CM | POA: Diagnosis present

## 2015-03-30 DIAGNOSIS — K222 Esophageal obstruction: Secondary | ICD-10-CM | POA: Diagnosis not present

## 2015-03-30 DIAGNOSIS — E785 Hyperlipidemia, unspecified: Secondary | ICD-10-CM | POA: Insufficient documentation

## 2015-03-30 DIAGNOSIS — J45909 Unspecified asthma, uncomplicated: Secondary | ICD-10-CM | POA: Diagnosis not present

## 2015-03-30 DIAGNOSIS — I10 Essential (primary) hypertension: Secondary | ICD-10-CM | POA: Insufficient documentation

## 2015-03-30 DIAGNOSIS — Q394 Esophageal web: Secondary | ICD-10-CM | POA: Diagnosis not present

## 2015-03-30 DIAGNOSIS — E119 Type 2 diabetes mellitus without complications: Secondary | ICD-10-CM | POA: Insufficient documentation

## 2015-03-30 DIAGNOSIS — R1319 Other dysphagia: Secondary | ICD-10-CM | POA: Diagnosis present

## 2015-03-30 HISTORY — PX: ESOPHAGOGASTRODUODENOSCOPY: SHX5428

## 2015-03-30 HISTORY — PX: MALONEY DILATION: SHX5535

## 2015-03-30 SURGERY — EGD (ESOPHAGOGASTRODUODENOSCOPY)
Anesthesia: Moderate Sedation

## 2015-03-30 MED ORDER — MIDAZOLAM HCL 5 MG/5ML IJ SOLN
INTRAMUSCULAR | Status: AC
  Filled 2015-03-30: qty 10

## 2015-03-30 MED ORDER — MEPERIDINE HCL 100 MG/ML IJ SOLN
INTRAMUSCULAR | Status: DC | PRN
  Administered 2015-03-30: 25 mg via INTRAVENOUS
  Administered 2015-03-30: 50 mg via INTRAVENOUS
  Administered 2015-03-30: 25 mg via INTRAVENOUS

## 2015-03-30 MED ORDER — STERILE WATER FOR IRRIGATION IR SOLN
Status: DC | PRN
  Administered 2015-03-30: 12:00:00

## 2015-03-30 MED ORDER — MIDAZOLAM HCL 5 MG/5ML IJ SOLN
INTRAMUSCULAR | Status: DC | PRN
  Administered 2015-03-30: 2 mg via INTRAVENOUS
  Administered 2015-03-30 (×2): 1 mg via INTRAVENOUS

## 2015-03-30 MED ORDER — MEPERIDINE HCL 100 MG/ML IJ SOLN
INTRAMUSCULAR | Status: AC
  Filled 2015-03-30: qty 2

## 2015-03-30 MED ORDER — ONDANSETRON HCL 4 MG/2ML IJ SOLN
INTRAMUSCULAR | Status: AC
  Filled 2015-03-30: qty 2

## 2015-03-30 MED ORDER — LIDOCAINE VISCOUS 2 % MT SOLN
OROMUCOSAL | Status: DC | PRN
  Administered 2015-03-30: 3 mL via OROMUCOSAL

## 2015-03-30 MED ORDER — LIDOCAINE VISCOUS 2 % MT SOLN
OROMUCOSAL | Status: AC
  Filled 2015-03-30: qty 15

## 2015-03-30 MED ORDER — ONDANSETRON HCL 4 MG/2ML IJ SOLN
INTRAMUSCULAR | Status: DC | PRN
  Administered 2015-03-30: 4 mg via INTRAVENOUS

## 2015-03-30 MED ORDER — SODIUM CHLORIDE 0.9 % IV SOLN
INTRAVENOUS | Status: DC
  Administered 2015-03-30: 10:00:00 via INTRAVENOUS

## 2015-03-30 NOTE — Interval H&P Note (Signed)
History and Physical Interval Note:  03/30/2015 11:33 AM  Elizabeth Mcintyre  has presented today for surgery, with the diagnosis of GERD, dyspepsia  The various methods of treatment have been discussed with the patient and family. After consideration of risks, benefits and other options for treatment, the patient has consented to  Procedure(s) with comments: ESOPHAGOGASTRODUODENOSCOPY (EGD) (N/A) - [address] (N/A) as a surgical intervention .  The patient's history has been reviewed, patient examined, no change in status, stable for surgery.  I have reviewed the patient's chart and labs.  Questions were answered to the patient's satisfaction.     [name]  No change. EGD with esophageal dilation as feasible/appropriate.The risks, benefits, limitations, imponderables and alternatives regarding both EGD and colonoscopy have been reviewed with the patient. Questions have been answered. All parties agreeable.

## 2015-03-30 NOTE — H&P (View-Only) (Signed)
Referring Provider: Irene Pap, NP Primary Care Physician:  Irene Pap, NP  Primary GI: (Previously Dr. Olevia Perches) Dr. Gala Romney   Chief Complaint  Patient presents with  . Follow-up    HPI:   Elizabeth Mcintyre is a 74 y.o. female presenting today with a history of recent hospital admission secondary to nausea chest discomfort, vomiting gross blood in the ED. EGD performed during hospitalization by Dr. Gala Romney with reflux esophagitis and Schatzki's ring not manipulated due to recent bleeding. Presents today to discuss need for elective EGD with dilation.  States food will sit heavy at times in esophagus and sometimes has to burp up food or liquids. Sometimes with eating notes upper abdominal discomfort, like a "soreness". Associated nausea. Prescribed Zofran and has only taken it once. Notes early satiety. Epigastric pain for several weeks. US abdomen in Feb 2016 was normal.    Previously seen by Dr. Olevia Perches in [place] with last colonoscopy in May 2012 by Dr. Olevia Perches with mild diverticulosis, otherwise normal. Due for repeat colonoscopy in 10 years.   Past Medical History  Diagnosis Date  . Diabetes mellitus     type 2  . Hyperlipidemia   . Hypertension   . Asthma   . NECK AND BACK PAIN 11/26/2010  . HYPERTENSION 11/09/2007  . HYPERLIPIDEMIA 11/09/2007  . HIP PAIN, RIGHT 11/26/2010  . HEMATURIA UNSPECIFIED 11/26/2010  . Vaginitis 01/01/2011  . ASTHMA 11/09/2007  . ALLERGIC RHINITIS CAUSE UNSPECIFIED 11/26/2010  . DIABETES MELLITUS, TYPE II 11/09/2007  . 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 Lymphadenopathy 04/03/2011  . Diverticulosis 03/2011  . Fatigue 07/22/2011  . Atypical chest pain 07/22/2011  . Sinusitis acute 12/05/2011  . Visual disturbance of one eye 12/05/2011  . Stress incontinence   . History of rheumatoid arthritis     during 30's, was treated.  . Depression     Past Surgical History  Procedure Laterality Date  . Abdominal hysterectomy    . Cardiac catheterization       X 2, last one in 1998  . Colonoscopy    . Colonoscopy  May 2012    Dr. Olevia Perches: mild diverticulosis, otherwise normal.   . Esophagogastroduodenoscopy N/A 01/28/2015    Dr. Gala Romney: reflux esophagitis, Schatzki's ring not manipulated due to recent bleeding    Current Outpatient Prescriptions  Medication Sig Dispense Refill  . allopurinol (ZYLOPRIM) 100 MG tablet Take 1 tablet (100 mg total) by mouth daily. 30 tablet 5  . amLODipine (NORVASC) 10 MG tablet Take 1 tablet (10 mg total) by mouth daily. 30 tablet 0  . Blood Glucose Monitoring Suppl (ONE TOUCH ULTRA SYSTEM KIT) W/DEVICE KIT 1 kit by Does not apply route once. 1 each 0  . Cholecalciferol (VITAMIN D3) 50000 UNITS CAPS Take 1 capsule by mouth every 7 (seven) days. 12 capsule 0  . Fluoxetine HCl, PMDD, 20 MG CAPS Take 1 capsule (20 mg total) by mouth daily. 30 each 2  . fluticasone (FLONASE) 50 MCG/ACT nasal spray 1 spray L nare twice daily. (Patient taking differently: Place 1 spray into both nostrils daily as needed for allergies. ) 16 g 6  . glucose blood (ONE TOUCH ULTRA TEST) test strip Use 1 test strip to test blood sugar levels 3 times daily 100 each 12  . levothyroxine (SYNTHROID, LEVOTHROID) 25 MCG tablet Take 1 tablet (25 mcg total) by mouth daily before breakfast. 30 tablet 2  . LORazepam (ATIVAN) 1 MG tablet Take 1/2 to 1T po bid PRN  anxiety (Patient taking differently: Take 0.5-1 mg by mouth 2 (two) times daily as needed for anxiety. ) 20 tablet 1  . losartan (COZAAR) 100 MG tablet Take 1 tablet (100 mg total) by mouth daily. 30 tablet 2  . ondansetron (ZOFRAN-ODT) 8 MG disintegrating tablet Take 1 tablet (8 mg total) by mouth 2 (two) times daily as needed for nausea or vomiting. 20 tablet 0  . ONETOUCH DELICA LANCETS FINE MISC Test twice daily. 180 each 2  . pantoprazole (PROTONIX) 40 MG tablet Take 1 tablet (40 mg total) by mouth 2 (two) times daily before a meal. 60 tablet 3  . pioglitazone (ACTOS) 15 MG tablet Take 1  tablet (15 mg total) by mouth daily. Take with largest meal of day. 90 tablet 1  . rosuvastatin (CRESTOR) 40 MG tablet Take 1 tablet (40 mg total) by mouth daily. 90 tablet 1  . sucralfate (CARAFATE) 1 G tablet Take 1 tablet (1 g total) by mouth 4 (four) times daily -  with meals and at bedtime. 90 tablet 1  . traMADol (ULTRAM) 50 MG tablet Take 1 tablet (50 mg total) by mouth 2 (two) times daily as needed. Take 1T po qhs X 3d, then take 1 T po bid 60 tablet 1  . vitamin B-12 (CYANOCOBALAMIN) 1000 MCG tablet Take 1,000 mcg by mouth daily.     No current facility-administered medications for this visit.    Allergies as of 03/06/2015 - Review Complete 03/06/2015  Allergen Reaction Noted  . Codeine phosphate      Family History  Problem Relation Age of Onset  . Heart disease Mother   . Osteoarthritis Mother   . Sudden death Father   . Single kidney Father   . Other Father     h/o severe MVA injuries  . Hyperlipidemia Sister   . Other Daughter     Myalgias  . Fibromyalgia Daughter   . Allergies Daughter   . Heart disease Maternal Grandfather   . Sudden death Paternal Grandmother   . Diabetes Paternal Grandfather   . Heart disease Daughter   . Other Daughter     palpitations  . Pulmonary fibrosis Maternal Aunt   . Cancer Paternal Uncle   . Pulmonary fibrosis Maternal Aunt   . Colon cancer Neg Hx     History   Social History  . Marital Status: Widowed    Spouse Name: N/A  . Number of Children: 2  . Years of Education: N/A   Occupational History  . retired    Social History Main Topics  . Smoking status: Never Smoker   . Smokeless tobacco: Never Used  . Alcohol Use: No  . Drug Use: No  . Sexual Activity: No   Other Topics Concern  . None   Social History Narrative   Elizabeth Mcintyre is widowed. Her young grandson lives with her, for whom she shares custody with her daughter.      Review of Systems: As mentioned in HPI  Physical Exam: BP 135/77 mmHg  Pulse 82   Temp(Src) 97.7 F (36.5 C)  Ht _0  (1.575 m)  Wt 160 lb (72.576 kg)  BMI 29.26 kg/m2 General:   Alert and oriented. No distress noted. Pleasant and cooperative.  Head:  Normocephalic and atraumatic. Eyes:  Conjuctiva clear without scleral icterus. Mouth:  Oral mucosa pink and moist. Good dentition. No lesions. Heart:  S1, S2 present without murmurs, rubs, or gallops. Regular rate and rhythm. Abdomen:  +BS, soft, mild  epigastric discomfort and mild chronic RLQ discomfort with palpation and non-distended.  Msk:  Symmetrical without gross deformities. Normal posture. Extremities:  Without edema. Neurologic:  Alert and  oriented x4;  grossly normal neurologically. Psych:  Alert and cooperative. Normal mood and affect.

## 2015-03-30 NOTE — Op Note (Signed)
[facility] [address] [place], [address]   ENDOSCOPY PROCEDURE REPORT  PATIENT: Madora, Barletta  MR#: [id number] BIRTHDATE: 1941-05-27 , 57  yrs. old GENDER: female ENDOSCOPIST: R.  Garfield Cornea, MD FACP FACG REFERRED BY:  Ricardo Jericho, M.D. PROCEDURE DATE:  04/18/15 PROCEDURE:  EGD diagnostic and Maloney dilation of esophagus INDICATIONS:  Esophageal dysphagia; history of Schatzki's ring; history of ulcerative reflux esophagitis. MEDICATIONS: Versed 4 mg IV and Demerol 100 mg IV in divided doses. Xylocaine gel orally.  Zofran 4 mg IV. ASA CLASS:      Class II  CONSENT: The risks, benefits, limitations, alternatives and imponderables have been discussed.  The potential for biopsy, esophogeal dilation, etc. have also been reviewed.  Questions have been answered.  All parties agreeable.  Please see the history and physical in the medical record for more information.  DESCRIPTION OF PROCEDURE: After the risks benefits and alternatives of the procedure were thoroughly explained, informed consent was obtained.  The EG-[id number] ([id number]) endoscope was introduced through the mouth and advanced to the second portion of the duodenum , limited by Without limitations. The instrument was slowly withdrawn as the mucosa was fully examined. Estimated blood loss is zero unless otherwise noted in this procedure report.    Prominent Schatzki's ring; otherwise, normal-appearing tubular esophagus.  (previously noted esophageal ulcer completely healed). No evidence of Barrett's esophagus.  Stomach is empty. Normal-appearing gastric mucosa.  Patent pylorus.  Normal-appearing first and second portion of the duodenum.  Scope was withdrawn and a 54 [place] Maloney dilator was passed to full insertion with ease.  A look back revealed the ring had been nicely ruptured without apparent complication  There was also a superficial tear through the UES mucosa.  EBL 5 mL.   Retroflexed views revealed no abnormalities.     The scope was then withdrawn from the patient and the procedure completed.  COMPLICATIONS: There were no immediate complications.  ENDOSCOPIC IMPRESSION: Schatzki's ring?"status post Maloney dilation. Remainder of EGD normal.   Previously noted esophageal ulcer completely healed.  RECOMMENDATIONS: Decrease Protonix to 40 mg once daily. Office visit with [place] in one year.  REPEAT EXAM:  eSigned:  R. Garfield Cornea, MD Rosalita Chessman [facility] April 18, 2015 12:01 PM    CC:  CPT CODES: ICD CODES:  The ICD and CPT codes recommended by this software are interpretations from the data that the clinical staff has captured with the software.  The verification of the translation of this report to the ICD and CPT codes and modifiers is the sole responsibility of the health care institution and practicing physician where this report was generated.  [facility]. will not be held responsible for the validity of the ICD and CPT codes included on this report.  AMA assumes no liability for data contained or not contained herein. CPT is a Designer, television/film set of the [employer].  PATIENT NAME:  Khloee, Garza MR#: [id number]

## 2015-03-30 NOTE — Discharge Instructions (Addendum)
EGD Discharge instructions Please read the instructions outlined below and refer to this sheet in the next few weeks. These discharge instructions provide you with general information on caring for yourself after you leave the hospital. Your doctor may also give you specific instructions. While your treatment has been planned according to the most current medical practices available, unavoidable complications occasionally occur. If you have any problems or questions after discharge, please call your doctor. ACTIVITY  You may resume your regular activity but move at a slower pace for the next 24 hours.   Take frequent rest periods for the next 24 hours.   Walking will help expel (get rid of) the air and reduce the bloated feeling in your abdomen.   No driving for 24 hours (because of the anesthesia (medicine) used during the test).   You may shower.   Do not sign any important legal documents or operate any machinery for 24 hours (because of the anesthesia used during the test).  NUTRITION  Drink plenty of fluids.   You may resume your normal diet.   Begin with a light meal and progress to your normal diet.   Avoid alcoholic beverages for 24 hours or as instructed by your caregiver.  MEDICATIONS  You may resume your normal medications unless your caregiver tells you otherwise.  WHAT YOU CAN EXPECT TODAY  You may experience abdominal discomfort such as a feeling of fullness or gas pains.  FOLLOW-UP  Your doctor will discuss the results of your test with you.  SEEK IMMEDIATE MEDICAL ATTENTION IF ANY OF THE FOLLOWING OCCUR:  Excessive nausea (feeling sick to your stomach) and/or vomiting.   Severe abdominal pain and distention (swelling).   Trouble swallowing.   Temperature over 101 F (37.8 C).   Rectal bleeding or vomiting of blood.    GERD information provided  Decrease Protonix to 40 mg once daily  Office visit with [place] in one year  May use Chloraseptic Spray  for any mild sore throat you may experience over the next couple of days   Gastroesophageal Reflux Disease, Adult Gastroesophageal reflux disease (GERD) happens when acid from your stomach flows up into the esophagus. When acid comes in contact with the esophagus, the acid causes soreness (inflammation) in the esophagus. Over time, GERD may create small holes (ulcers) in the lining of the esophagus. CAUSES  Increased body weight. This puts pressure on the stomach, making acid rise from the stomach into the esophagus. Smoking. This increases acid production in the stomach. Drinking alcohol. This causes decreased pressure in the lower esophageal sphincter (valve or ring of muscle between the esophagus and stomach), allowing acid from the stomach into the esophagus. Late evening meals and a full stomach. This increases pressure and acid production in the stomach. A malformed lower esophageal sphincter. Sometimes, no cause is found. SYMPTOMS  Burning pain in the lower part of the mid-chest behind the breastbone and in the mid-stomach area. This may occur twice a week or more often. Trouble swallowing. Sore throat. Dry cough. Asthma-like symptoms including chest tightness, shortness of breath, or wheezing. DIAGNOSIS  Your caregiver may be able to diagnose GERD based on your symptoms. In some cases, X-rays and other tests may be done to check for complications or to check the condition of your stomach and esophagus. TREATMENT  Your caregiver may recommend over-the-counter or prescription medicines to help decrease acid production. Ask your caregiver before starting or adding any new medicines.  HOME CARE INSTRUCTIONS  Change the  factors that you can control. Ask your caregiver for guidance concerning weight loss, quitting smoking, and alcohol consumption. Avoid foods and drinks that make your symptoms worse, such as: Caffeine or alcoholic drinks. Chocolate. Peppermint or mint flavorings. Garlic  and onions. Spicy foods. Citrus fruits, such as oranges, lemons, or limes. Tomato-based foods such as sauce, chili, salsa, and pizza. Fried and fatty foods. Avoid lying down for the 3 hours prior to your bedtime or prior to taking a nap. Eat small, frequent meals instead of large meals. Wear loose-fitting clothing. Do not wear anything tight around your waist that causes pressure on your stomach. Raise the head of your bed 6 to 8 inches with wood blocks to help you sleep. Extra pillows will not help. Only take over-the-counter or prescription medicines for pain, discomfort, or fever as directed by your caregiver. Do not take aspirin, ibuprofen, or other nonsteroidal anti-inflammatory drugs (NSAIDs). SEEK IMMEDIATE MEDICAL CARE IF:  You have pain in your arms, neck, jaw, teeth, or back. Your pain increases or changes in intensity or duration. You develop nausea, vomiting, or sweating (diaphoresis). You develop shortness of breath, or you faint. Your vomit is green, yellow, black, or looks like coffee grounds or blood. Your stool is red, bloody, or black. These symptoms could be signs of other problems, such as heart disease, gastric bleeding, or esophageal bleeding. MAKE SURE YOU:  Understand these instructions. Will watch your condition. Will get help right away if you are not doing well or get worse. Document Released: 06/11/2005 Document Revised: 11/24/2011 Document Reviewed: 03/21/2011 [facility] Patient Information 2015 [place], [place]. This information is not intended to replace advice given to you by your health care provider. Make sure you discuss any questions you have with your health care provider.

## 2015-04-02 ENCOUNTER — Encounter (HOSPITAL_COMMUNITY): Payer: Self-pay | Admitting: Internal Medicine

## 2015-04-02 LAB — GLUCOSE, CAPILLARY: Glucose-Capillary: 117 mg/dL — ABNORMAL HIGH (ref 65–99)

## 2015-04-09 ENCOUNTER — Other Ambulatory Visit: Payer: Self-pay | Admitting: *Deleted

## 2015-04-09 DIAGNOSIS — I10 Essential (primary) hypertension: Secondary | ICD-10-CM

## 2015-04-09 MED ORDER — AMLODIPINE BESYLATE 10 MG PO TABS: 10.0000 mg | ORAL_TABLET | Freq: Every day | ORAL | Status: DC

## 2015-04-09 NOTE — Telephone Encounter (Signed)
RF request for amlodipine LOV: 03/14/15 Next ov: 05/25/15 Last written: 02/27/15 #30 w/ 1RF

## 2015-05-11 ENCOUNTER — Other Ambulatory Visit: Payer: Self-pay

## 2015-05-11 ENCOUNTER — Telehealth: Payer: Self-pay

## 2015-05-11 MED ORDER — SUCRALFATE 1 G PO TABS: 1.0000 g | ORAL_TABLET | Freq: Three times a day (TID) | ORAL | Status: DC

## 2015-05-11 NOTE — Telephone Encounter (Signed)
Pls call pt, if she is requesting it then she needs to come in for evaluation. I was not the prescribing provider for that medication and I have never met or evaluated her.

## 2015-05-11 NOTE — Telephone Encounter (Signed)
[facility] sent in request for refill of Tramadol.  Our records show that it is on her med list, but patient currently not taking. (She had finished the course)  Please advise.

## 2015-05-24 ENCOUNTER — Other Ambulatory Visit: Payer: Self-pay

## 2015-05-24 DIAGNOSIS — E039 Hypothyroidism, unspecified: Secondary | ICD-10-CM

## 2015-05-24 MED ORDER — LEVOTHYROXINE SODIUM 25 MCG PO TABS: 25.0000 ug | ORAL_TABLET | Freq: Every day | ORAL | Status: DC

## 2015-05-25 ENCOUNTER — Encounter: Payer: Self-pay | Admitting: Family Medicine

## 2015-05-25 ENCOUNTER — Ambulatory Visit (INDEPENDENT_AMBULATORY_CARE_PROVIDER_SITE_OTHER): Payer: Medicare Other | Admitting: Family Medicine

## 2015-05-25 ENCOUNTER — Telehealth: Payer: Self-pay | Admitting: Family Medicine

## 2015-05-25 ENCOUNTER — Ambulatory Visit (HOSPITAL_COMMUNITY)
Admission: RE | Admit: 2015-05-25 | Discharge: 2015-05-25 | Disposition: A | Discharge status: Home / Self Care | Payer: Medicare Other | Source: Ambulatory Visit | Attending: Family Medicine | Admitting: Family Medicine

## 2015-05-25 VITALS — BP 109/70 | HR 71 | Temp 97.9°F | Resp 16 | Ht 62.5 in | Wt 156.0 lb

## 2015-05-25 DIAGNOSIS — R05 Cough: Secondary | ICD-10-CM

## 2015-05-25 DIAGNOSIS — E1122 Type 2 diabetes mellitus with diabetic chronic kidney disease: Secondary | ICD-10-CM | POA: Diagnosis not present

## 2015-05-25 DIAGNOSIS — N189 Chronic kidney disease, unspecified: Secondary | ICD-10-CM

## 2015-05-25 DIAGNOSIS — E559 Vitamin D deficiency, unspecified: Secondary | ICD-10-CM | POA: Diagnosis not present

## 2015-05-25 DIAGNOSIS — E038 Other specified hypothyroidism: Secondary | ICD-10-CM

## 2015-05-25 DIAGNOSIS — M533 Sacrococcygeal disorders, not elsewhere classified: Secondary | ICD-10-CM

## 2015-05-25 DIAGNOSIS — R059 Cough, unspecified: Secondary | ICD-10-CM

## 2015-05-25 DIAGNOSIS — G8929 Other chronic pain: Secondary | ICD-10-CM

## 2015-05-25 DIAGNOSIS — R0609 Other forms of dyspnea: Secondary | ICD-10-CM

## 2015-05-25 HISTORY — DX: Vitamin D deficiency, unspecified: E55.9

## 2015-05-25 LAB — BASIC METABOLIC PANEL
BUN: 20 mg/dL (ref 6–23)
CHLORIDE: 103 meq/L (ref 96–112)
CO2: 25 meq/L (ref 19–32)
Calcium: 9.7 mg/dL (ref 8.4–10.5)
Creatinine, Ser: 1.41 mg/dL — ABNORMAL HIGH (ref 0.40–1.20)
GFR: 38.85 mL/min — ABNORMAL LOW (ref >60.00)
GLUCOSE: 111 mg/dL — AB (ref 70–99)
POTASSIUM: 3.9 meq/L (ref 3.5–5.1)
Sodium: 139 mEq/L (ref 135–145)

## 2015-05-25 LAB — CBC WITH DIFFERENTIAL/PLATELET
BASOS ABS: 0.1 10*3/uL (ref 0.0–0.1)
Basophils Relative: 0.9 % (ref 0.0–3.0)
EOS PCT: 6.2 % — AB (ref 0.0–5.0)
Eosinophils Absolute: 0.5 10*3/uL (ref 0.0–0.7)
HEMATOCRIT: 38.5 % (ref 36.0–46.0)
HEMOGLOBIN: 12.7 g/dL (ref 12.0–15.0)
LYMPHS PCT: 29.1 % (ref 12.0–46.0)
Lymphs Abs: 2.4 10*3/uL (ref 0.7–4.0)
MCHC: 32.9 g/dL (ref 30.0–36.0)
MCV: 86.3 fl (ref 78.0–100.0)
Monocytes Absolute: 0.8 10*3/uL (ref 0.1–1.0)
Monocytes Relative: 10.1 % (ref 3.0–12.0)
NEUTROS PCT: 53.7 % (ref 43.0–77.0)
Neutro Abs: 4.3 10*3/uL (ref 1.4–7.7)
Platelets: 215 10*3/uL (ref 150.0–400.0)
RBC: 4.47 Mil/uL (ref 3.87–5.11)
RDW: 14.8 % (ref 11.5–15.5)
WBC: 8.1 10*3/uL (ref 4.0–10.5)

## 2015-05-25 LAB — TSH: TSH: 6.44 u[IU]/mL — ABNORMAL HIGH (ref 0.35–4.50)

## 2015-05-25 LAB — VITAMIN D 25 HYDROXY (VIT D DEFICIENCY, FRACTURES): VITD: 51.07 ng/mL (ref 30.00–100.00)

## 2015-05-25 LAB — HEMOGLOBIN A1C: Hgb A1c MFr Bld: 6.2 % (ref 4.6–6.5)

## 2015-05-25 IMAGING — DX DG CHEST 2V
2 series · 2 of 2 positions shown · non-contrast
Comparison: None.

CLINICAL DATA: Cough.

EXAM:
CHEST  2 VIEW

[chest pa]
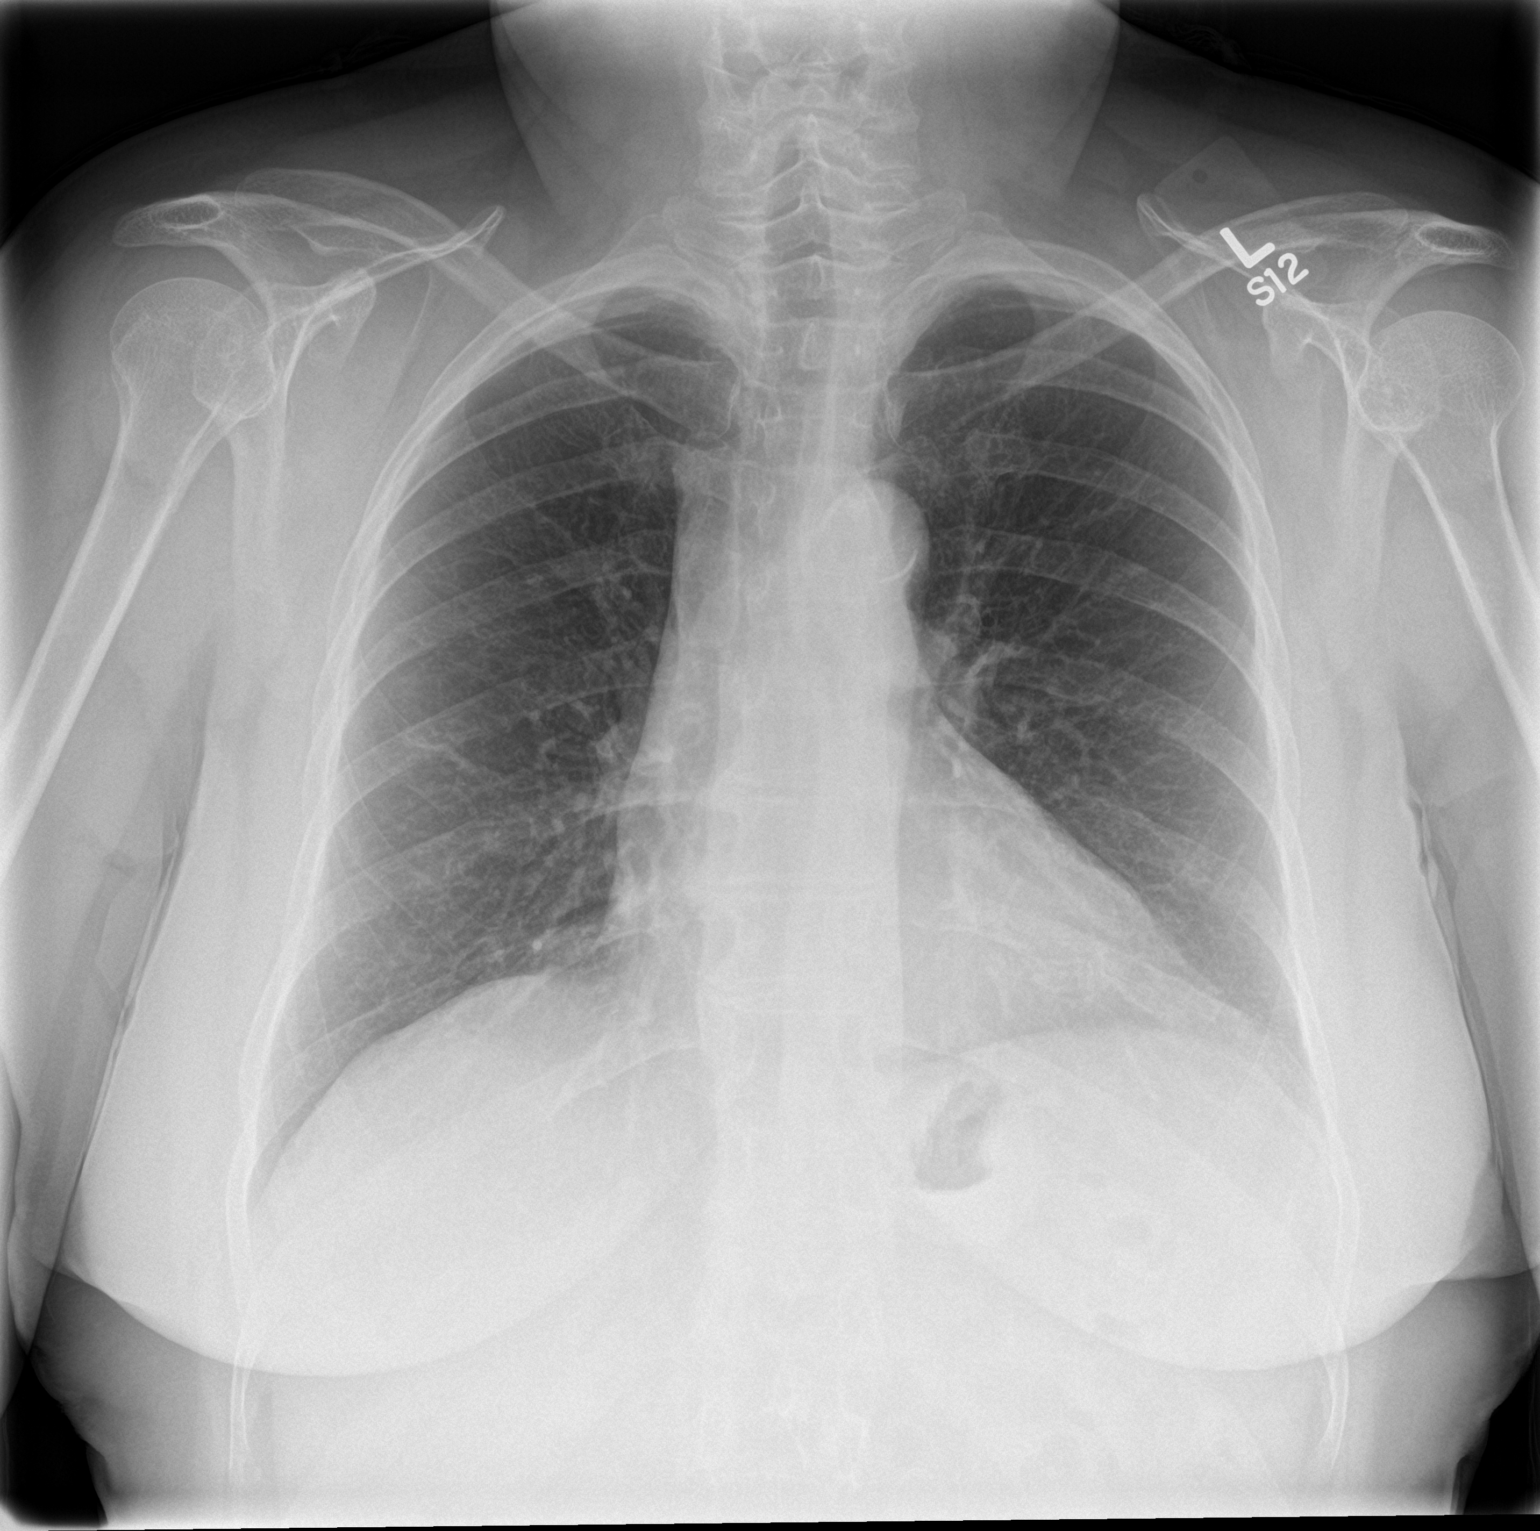

[chest lat]
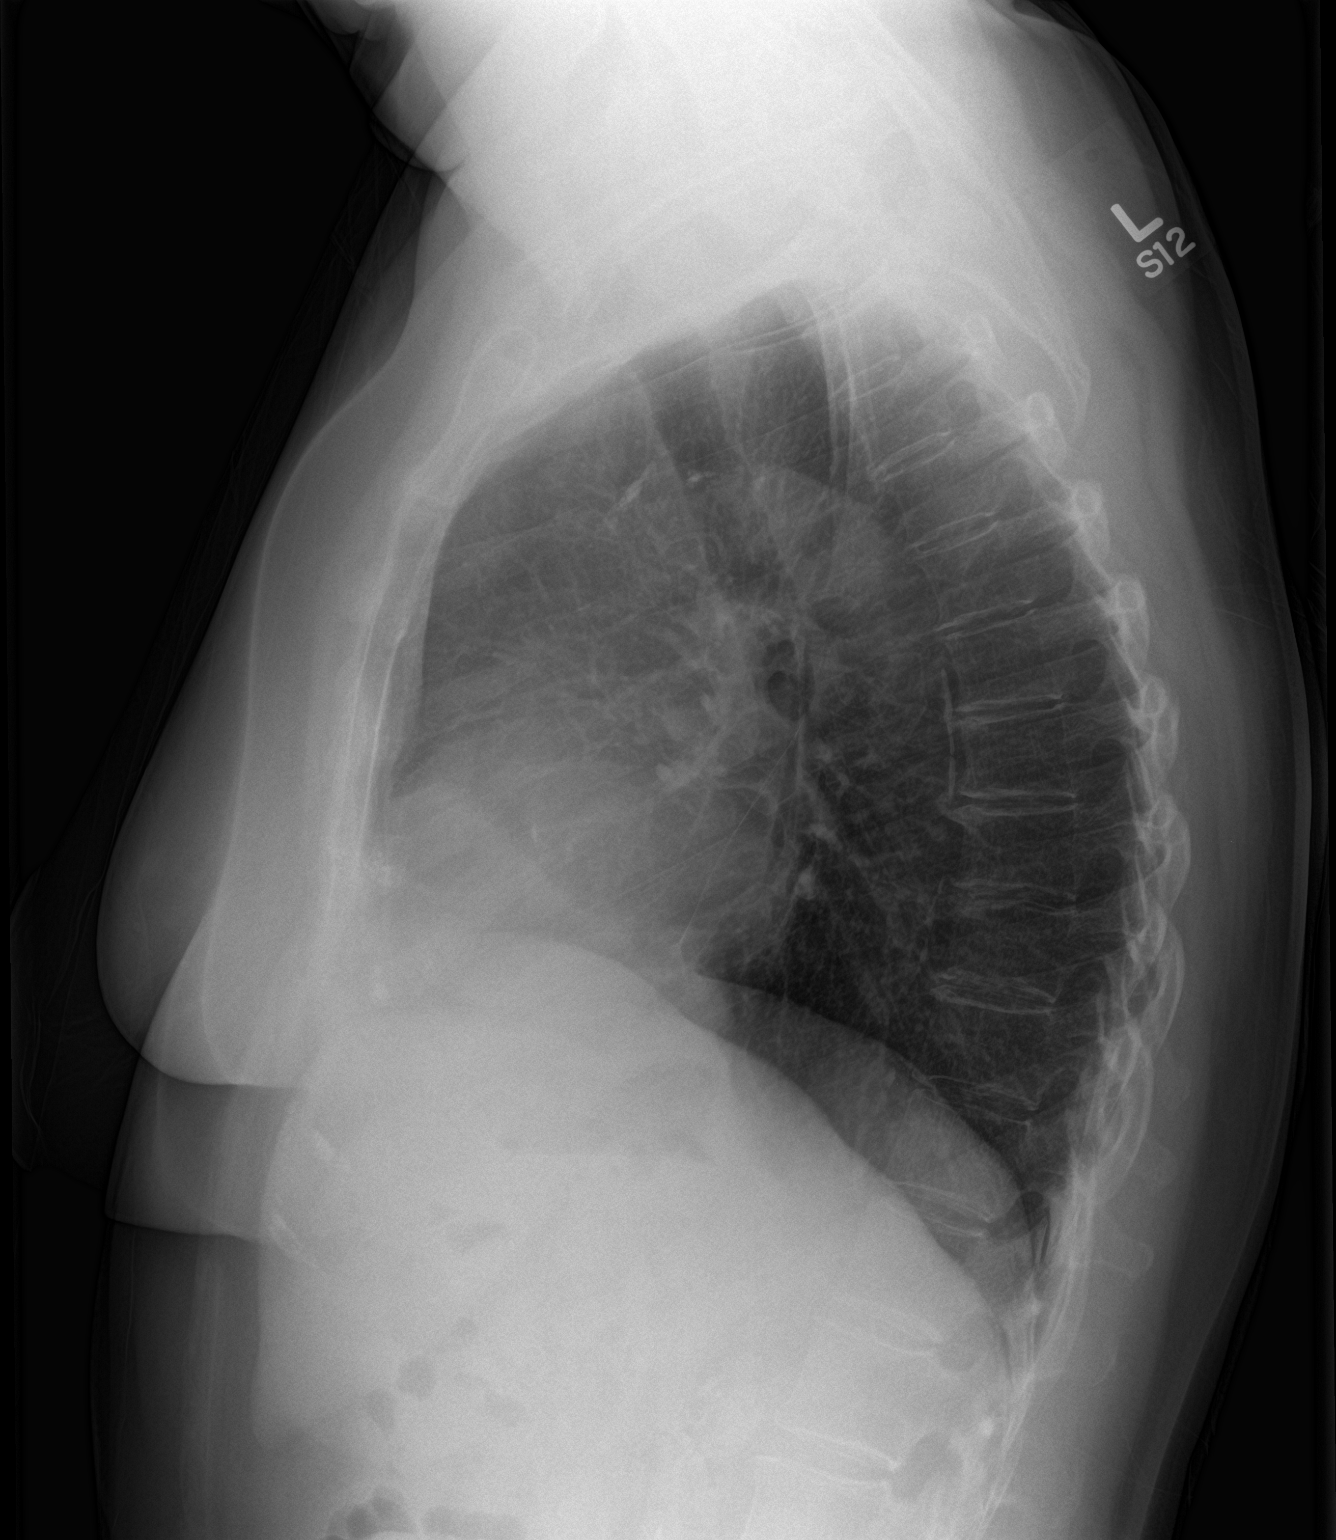

[2 of 2 positions shown; findings below may reference images not displayed]

FINDINGS: Mediastinum hilar structures normal. Basal pleural-parenchymal
thickening noted consistent with scarring. Lungs are clear of acute
infiltrate. Heart size normal. No pleural effusion or pneumothorax.
IMPRESSION: Bibasilar pleural parenchymal thickening consistent with scarring.
No acute abnormality .

## 2015-05-25 MED ORDER — TRAMADOL HCL 50 MG PO TABS
50.0000 mg | ORAL_TABLET | Freq: Two times a day (BID) | ORAL | Status: DC | PRN
Start: 2015-05-25 — End: 2017-01-29

## 2015-05-25 NOTE — Progress Notes (Signed)
Pre visit review using our clinic review tool, if applicable. No additional management support is needed unless otherwise documented below in the visit note. 

## 2015-05-25 NOTE — Patient Instructions (Signed)
I will call you with all the results once available Please get xray today Start 5 mg amlodipine instead of 10 mg and call in next week with blood pressures, goal <140/80

## 2015-05-25 NOTE — Progress Notes (Signed)
Subjective:    Patient ID: Elizabeth Mcintyre, female    DOB: 11-12-1940, 74 y.o.   MRN: [id number]  HPI Patient presents to visit for follow-up on chronic medical conditions, with an acute complaint of cough  Cough: Patient reports she's had a cough since Tuesday, it is nonproductive in nature. She denies any fever, chills, headaches, rash or nasal congestion. She does admit to shortness of breath, but she states this has been becoming increasingly worse for over some time and not associated with this cough. She feels the cough is worse at night. She feels her feet have been swelling, and notices that they "get darker ". Patient has a history of asthma, she had a cardiac catheterization 2. Last echo was in 2011. There is a family history of heart disease, and pulmonary fibrosis.  Type 2 diabetes mellitus with diabetic chronic kidney disease: Patient states that her medications have been recently changed, and on her last appointment a decreased her Amaryl to every other day. She states she took that for a while, but then increased it again to every day because her fasting blood sugars in the morning were above 150. Her last A1c was June 2016 and was 6. She denies any hypo-or hyperglycemic events. Patient denies any nonhealing wounds on her extremities. 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  Hypertension: Patient states that currently takes amlodipine 10 mg, and losartan 100 mg daily. She reports frequent dizziness upon standing and fatigue. She does monitor her blood pressure home on occasions, and she states that she never sees anything higher than 825 systolic, and she seen as low as 50 diastolic. She denies any chest pain with radiation, she admits to some lower extremity edema, she denies any visual changes or headaches. She does admit to some dizziness upon standing.  Vitamin D deficiency: Patient has a history of vitamin D deficiency, she was supplemented with oral vitamin D  for 12 weeks.   Other specified hypothyroidism: Patient was found to have a very mild increase in her TSH in July 2015 of 5.53. At that time she was placed on Synthroid 25 g. She states that her thyroid has been stable since but they have been following it closely. She denies any heat or cold intolerance, increasing dry skin or confusion.  Chronic right SI joint pain: Pt has chronic right-sided SI joint pain, she uses tramadol infrequently for this pain. She has run out of her prescription, is requesting more tramadol today.   Dyspnea on exertion: Patient reports she's noticed increasing dyspnea on exertion. She states her exercise stamina, just even with walking a city block has significantly decreased over the last few months. She has noted some lower extremity edema, that she states occurs 1-2 times a week. She reports that when she has her edema, her feet "get darker ". She denies any current or recent chest pain, but admits to chest tightness with walking. She denies orthopnea. She was evaluated by cardiology last year with similar complaints. She does admit to increasing nighttime cough. She has a significant family history of heart disease.   Past Medical History  Diagnosis Date  . Diabetes mellitus     type 2  . Hyperlipidemia   . Hypertension   . Asthma   . NECK AND BACK PAIN 11/26/2010  . HYPERTENSION 11/09/2007  . HYPERLIPIDEMIA 11/09/2007  . HIP PAIN, RIGHT 11/26/2010  . HEMATURIA UNSPECIFIED 11/26/2010  . Vaginitis 01/01/2011  . ASTHMA 11/09/2007  . ALLERGIC  RHINITIS CAUSE UNSPECIFIED 11/26/2010  . DIABETES MELLITUS, TYPE II 11/09/2007  . 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 Lymphadenopathy 04/03/2011  . Diverticulosis 03/2011  . Fatigue 07/22/2011  . Atypical chest pain 07/22/2011  . Sinusitis acute 12/05/2011  . Visual disturbance of one eye 12/05/2011  . Stress incontinence   . History of rheumatoid arthritis     during 30's, was treated.  . Depression    Past Surgical History   Procedure Laterality Date  . Abdominal hysterectomy    . Cardiac catheterization      X 2, last one in 1998  . Colonoscopy    . Colonoscopy  May 2012    Dr. Olevia Perches: mild diverticulosis, otherwise normal.   . Esophagogastroduodenoscopy N/A 01/28/2015    Dr. Gala Romney: reflux esophagitis, Schatzki's ring not manipulated due to recent bleeding  . Esophagogastroduodenoscopy N/A 03/30/2015    Procedure: ESOPHAGOGASTRODUODENOSCOPY (EGD);  Surgeon: Daneil Dolin, MD;  Location[facility];  Service: Endoscopy;  Laterality: N/A;  1100  . Maloney dilation N/A 03/30/2015    Procedure: Venia Minks DILATION;  Surgeon: Daneil Dolin, MD;  Location: [facility];  Service: Endoscopy;  Laterality: N/A;    Allergies  Allergen Reactions  . Codeine Phosphate Nausea And Vomiting and Rash   Social History   Social History  . Marital Status: Widowed    Spouse Name: N/A  . Number of Children: 2  . Years of Education: N/A   Occupational History  . retired    Social History Main Topics  . Smoking status: Never Smoker   . Smokeless tobacco: Never Used  . Alcohol Use: No  . Drug Use: No  . Sexual Activity: No   Other Topics Concern  . Not on file   Social History Narrative   Ms. Schlick is widowed. Her young grandson lives with her, for whom she shares custody with her daughter.     Family History  Problem Relation Age of Onset  . Heart disease Mother   . Osteoarthritis Mother   . Sudden death Father   . Single kidney Father   . Other Father     h/o severe MVA injuries  . Hyperlipidemia Sister   . Other Daughter     Myalgias  . Fibromyalgia Daughter   . Allergies Daughter   . Heart disease Maternal Grandfather   . Sudden death Paternal Grandmother   . Diabetes Paternal Grandfather   . Heart disease Daughter   . Other Daughter     palpitations  . Pulmonary fibrosis Maternal Aunt   . Cancer Paternal Uncle   . Pulmonary fibrosis Maternal Aunt   . Colon cancer Neg Hx      Review  of Systems Negative, with the exception of above mentioned in HPI     Objective:   Physical Exam BP 109/70 mmHg  Pulse 71  Temp(Src) 97.9 F (36.6 C) (Oral)  Resp 16  Ht 5' 2.5" (1.588 m)  Wt 156 lb (70.761 kg)  BMI 28.06 kg/m2  SpO2 100% Gen: Afebrile. No acute distress. Nontoxic in appearance, fatigued appearing. Caucasian female. Mildly overweight.  HENT: AT. [place]. Bilateral TM visualized and normal in appearance. Bilateral eyes without injections or icterus. MMM. Bilateral nares with mild erythema, no swelling. Throat without erythema or exudates. Moderate cough throughout exam. No hoarseness. Eyes:Pupils Equal Round Reactive to light, Extraocular movements intact, Conjunctiva without redness, discharge or icterus. Neck: Supple, no lymphadenopathy, no thyromegaly CV: RRR 1/6 SM appreciated, trace edema, +2/4 P  posterior tibialis pulses Chest: Bilateral rhonchi and crackles appreciated, no wheezing. Good air movement. Abd: Soft. Round. NTND. BS present. No Masses palpated.  Neuro: Normal gait. PERLA. EOMi. Alert. Oriented 3  Psych: Normal affect, dress and demeanor. Normal speech. Normal thought content and judgment..      Assessment & Plan:  1. Cough - Coughing throughout exam today. Will obtain chest x-ray rule out pneumonia versus fluid. Likely viral in nature, discussed treatment plan with conservative management if no pneumonia/fluid. Patient to rest, hydrate, Tylenol for any fevers aches or pains, Mucinex/over-the-counter cough syrup. Follow-up in one week if no improvement.  - DG Chest 2 View; Future--> checks x-ray results reviewed, I personally reviewed films, bibasilar pleural parenchymal thickening consistent with scarring, no acute abnormality noted. No pleural effusions, no pneumonia. - CBC w/Diff  2. Type 2 diabetes mellitus with diabetic chronic kidney disease - Discussed normal range for age and chronic conditions for her A1c would be 7, otherwise increasing risk  for hypoglycemic events.  - HgB O6V - Basic metabolic panel - Patient will be called with results, medication changes needed to be made will be done so at that time.  3. Vitamin D deficiency - Vitamin D (25 hydroxy) repeat today, if vitamin D remains low, will continue supplementation for another 12 weeks. Otherwise she is to start taking over-the-counter supplementation daily.  4. Other specified hypothyroidism - TSH, repeat TSH today, although I do not believe she is likely truly hypothyroid. Patient has been on 25 g of Synthroid, depending on TSH results, may consider discontinuing Synthroid supplement in the future, with close following of TSH.  5. Chronic right SI joint pain - traMADol (ULTRAM) 50 MG tablet; Take 1 tablet (50 mg total) by mouth 2 (two) times daily as needed.  Dispense: 60 tablet; Refill: 1  6. Dyspnea on exertion - Patient with lower extremity edema intermittently, dyspnea on exertion that seems to be worsening and cough that is worsening. Will obtain echocardiogram to rule out the possibility of heart failure, chest x-ray also obtained today for cough. - No edema noted on exam today, harsh crackles appreciated. - Echocardiogram; Future  7. Hypertension: Patient with what sounds like orthostatic hypotension. Blood pressure today is well controlled, and on the lower end. We'll decrease amlodipine to 5 mg daily, this may be causing some of her swelling as well being on the 10 mg dose. Patient to take her blood pressure daily for 7 days, and report the results back to [place]. She is to call immediately if her results are consistently above 140/80. If patient has controlled blood pressures on 5 mg of amlodipine, will call in new dose to pharmacy.  Follow-up in 3 months, or sooner if labs or imaging studies are irregular.

## 2015-05-25 NOTE — Telephone Encounter (Signed)
Please call patient, her chest x-ray does not show any signs of pneumonia. Her cough is likely from a virus, therefore she should treat symptomatically with over-the-counter medications. - Get plenty of rest, stay well-hydrated, Tylenol for fever or aches, and Mucinex/cough syrup to help with secretions and cough. She should follow-up if she worsens, or if her symptoms are not improved in one week.

## 2015-05-25 NOTE — Telephone Encounter (Signed)
Patient aware of results and OTC medication instructions.   Pt voiced understanding and had no questions at this time.

## 2015-05-28 ENCOUNTER — Telehealth: Payer: Self-pay | Admitting: Family Medicine

## 2015-05-28 DIAGNOSIS — R059 Cough, unspecified: Secondary | ICD-10-CM

## 2015-05-28 DIAGNOSIS — R05 Cough: Secondary | ICD-10-CM

## 2015-05-28 DIAGNOSIS — E039 Hypothyroidism, unspecified: Secondary | ICD-10-CM

## 2015-05-28 MED ORDER — LEVOTHYROXINE SODIUM 50 MCG PO TABS: 50.0000 ug | ORAL_TABLET | Freq: Every day | ORAL | Status: DC

## 2015-05-28 MED ORDER — ALBUTEROL SULFATE HFA 108 (90 BASE) MCG/ACT IN AERS: 2.0000 | INHALATION_SPRAY | Freq: Four times a day (QID) | RESPIRATORY_TRACT | Status: DC | PRN

## 2015-05-28 MED ORDER — LEVOTHYROXINE SODIUM 50 MCG PO TABS: 25.0000 ug | ORAL_TABLET | Freq: Every day | ORAL | Status: DC

## 2015-05-28 NOTE — Telephone Encounter (Signed)
Please call patient: - Patient's vitamin D is in normal range (51). She no longer needs prescribed vitamin D supplementation, but she needs to take daily over-the-counter vitamin D supplementation 600-800 international units daily. She should also take 1000 mg of calcium daily if able. - Her kidney function and liver function are stable. Her electrolytes are normal. She does not show any signs of anemia. - Her A1c is 6.2, I would like her to return to taking her Actos every other day. With her other chronic illnesses, and age, her goal A1c is approximately 7. - Her TSH is 6.44, this is elevated since her last readings. Please make certain patient is taking her Synthroid daily, if she is we will need to increase her Synthroid to 50 g daily.  - Follow-up in 3 months

## 2015-05-28 NOTE — Telephone Encounter (Signed)
Patient aware of results and new medication recommendations.  Pt states that she does take her synthroid daily so she will need increased dose.  Pt also asked for refill of her albuterol inhaler?  Can you please advise.

## 2015-05-28 NOTE — Telephone Encounter (Signed)
Albuterol inhaler refilled per pt request. I have also increased the dose of her synthroid, she can two of the pills of her current prescription, when she runs out her new refill at the pharmacy will be for the higher dose (then she takes one only). She will need her TSH checked on her f/u appt she was advised to make for 3 months for other chronic issues.

## 2015-05-29 ENCOUNTER — Telehealth (HOSPITAL_COMMUNITY): Payer: Self-pay | Admitting: *Deleted

## 2015-05-29 NOTE — Telephone Encounter (Signed)
Patient aware of new medications at pharmacy.  Pt unable to schedule at this time.  She will CB closer to 3 months.  Pt had no questions at this time.

## 2015-06-05 ENCOUNTER — Other Ambulatory Visit: Payer: Self-pay

## 2015-06-05 ENCOUNTER — Ambulatory Visit (HOSPITAL_COMMUNITY): Payer: Medicare Other | Attending: Cardiovascular Disease

## 2015-06-05 DIAGNOSIS — R0600 Dyspnea, unspecified: Secondary | ICD-10-CM | POA: Diagnosis not present

## 2015-06-05 DIAGNOSIS — R0609 Other forms of dyspnea: Secondary | ICD-10-CM | POA: Diagnosis not present

## 2015-06-05 DIAGNOSIS — E785 Hyperlipidemia, unspecified: Secondary | ICD-10-CM | POA: Diagnosis not present

## 2015-06-05 DIAGNOSIS — I517 Cardiomegaly: Secondary | ICD-10-CM | POA: Diagnosis not present

## 2015-06-05 DIAGNOSIS — I10 Essential (primary) hypertension: Secondary | ICD-10-CM | POA: Insufficient documentation

## 2015-06-07 ENCOUNTER — Telehealth: Payer: Self-pay | Admitting: Family Medicine

## 2015-06-07 DIAGNOSIS — R0609 Other forms of dyspnea: Secondary | ICD-10-CM

## 2015-06-07 DIAGNOSIS — R931 Abnormal findings on diagnostic imaging of heart and coronary circulation: Secondary | ICD-10-CM

## 2015-06-07 NOTE — Telephone Encounter (Signed)
Discussed echocardiogram results with patient today. Patient did have some focal basal hypertrophy noted on her echo. Discussed her following up with cardiology for full evaluation on her increased dyspnea and her focal basal hypertrophy. Patient amendable to cardiology referral, she has seen by Dr.Hochrein at the  [facility] location in the past, and would like to return to there.

## 2015-07-17 ENCOUNTER — Other Ambulatory Visit: Payer: Self-pay | Admitting: *Deleted

## 2015-07-17 DIAGNOSIS — I10 Essential (primary) hypertension: Secondary | ICD-10-CM

## 2015-07-17 MED ORDER — LOSARTAN POTASSIUM 100 MG PO TABS: 100.0000 mg | ORAL_TABLET | Freq: Every day | ORAL | Status: DC

## 2015-07-17 NOTE — Telephone Encounter (Signed)
Refilled losartan per refill protocol

## 2015-07-18 ENCOUNTER — Encounter: Payer: Self-pay | Admitting: Cardiology

## 2015-07-18 ENCOUNTER — Ambulatory Visit (INDEPENDENT_AMBULATORY_CARE_PROVIDER_SITE_OTHER): Payer: Medicare Other | Admitting: Cardiology

## 2015-07-18 VITALS — BP 134/80 | HR 80 | Ht 62.0 in | Wt 156.0 lb

## 2015-07-18 DIAGNOSIS — I10 Essential (primary) hypertension: Secondary | ICD-10-CM | POA: Diagnosis not present

## 2015-07-18 NOTE — Patient Instructions (Signed)
Medication Instructions:  The current medical regimen is effective;  continue present plan and medications.  Testing/Procedures: Your physician has requested that you have an exercise tolerance test. For further information please visit [email or web address]. Please also follow instruction sheet, as given.  Follow-Up: Follow up as needed after testing.  If you need a refill on your cardiac medications before your next appointment, please call your pharmacy.  Thank you for choosing  [facility]!!     

## 2015-07-18 NOTE — Progress Notes (Signed)
Cardiology Office Note   Date:  07/18/2015   ID:  Elizabeth, Mcintyre 03/18/1941, MRN [id number]  PCP:  Felix Pacini, DO  Cardiologist:   Rollene Rotunda, MD   Chief Complaint  Patient presents with  . Leg Swelling      History of Present Illness: Elizabeth Mcintyre is a 74 y.o. female who presents for evaluation of dyspnea and an abnormal echo.  I saw her about two years ago.  She had had a negative stress test in the past for evaluation of atypical chest pain.  Echo in 2011 to evaluate dyspnea demonstrated a normal EF and no significant valvular abnormalities. When I saw her in 2014 her BNP was also normal. More recently she's had an echocardiogram and I reviewed these results. This was done last month. Again her ejection fraction was normal. There were no significant valvular abnormalities. She did have some apparent mild basilar septal hypertrophy.  She actually says that since I last saw her her breathing is better. She will get winded with moderate activities but this is not as severe as it used to be. She's not describing any PND or orthopnea. 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 She denies any cardiovascular symptoms such as chest discomfort, neck or arm discomfort. She's had no presyncope or syncope. She reports her blood pressures actually been better controlled recently and even a little low.  Past Medical History  Diagnosis Date  . Diabetes mellitus     type 2  . Hyperlipidemia   . Hypertension   . Asthma   . DIABETES MELLITUS, TYPE II 11/09/2007  . 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 Diverticulosis 03/2011  . Fatigue 07/22/2011  . Visual disturbance of one eye 12/05/2011  . Stress incontinence   . History of rheumatoid arthritis     during 30's, was treated.    Past Surgical History  Procedure Laterality Date  . Abdominal hysterectomy    . Cardiac catheterization      X 2, last one in 1998  . Colonoscopy    .  Colonoscopy  May 2012    Dr. Juanda Chance: mild diverticulosis, otherwise normal.   . Esophagogastroduodenoscopy N/A 01/28/2015    Dr. Jena Gauss: reflux esophagitis, Schatzki's ring not manipulated due to recent bleeding  . Esophagogastroduodenoscopy N/A 03/30/2015    Procedure: ESOPHAGOGASTRODUODENOSCOPY (EGD);  Surgeon: Corbin Ade, MD;  [facility];  Service: Endoscopy;  Laterality: N/A;  1100  . Maloney dilation N/A 03/30/2015    Procedure: Elease Hashimoto DILATION;  Surgeon: Corbin Ade, MD;  [facility]: [facility];  Service: Endoscopy;  Laterality: N/A;     Current Outpatient Prescriptions  Medication Sig Dispense Refill  . albuterol (PROVENTIL HFA;VENTOLIN HFA) 108 (90 BASE) MCG/ACT inhaler Inhale 2 puffs into the lungs every 6 (six) hours as needed for wheezing. 1 Inhaler 2  . allopurinol (ZYLOPRIM) 100 MG tablet Take 1 tablet (100 mg total) by mouth daily. 30 tablet 5  . amLODipine (NORVASC) 10 MG tablet Take 1 tablet (10 mg total) by mouth daily. 30 tablet 3  . Fluoxetine HCl, PMDD, 20 MG CAPS Take 1 capsule (20 mg total) by mouth daily. 30 each 2  . fluticasone (FLONASE) 50 MCG/ACT nasal spray 1 spray L nare twice daily. 16 g 6  . levothyroxine (SYNTHROID, LEVOTHROID) 50 MCG tablet Take 1 tablet (50 mcg total) by mouth daily before breakfast. 30 tablet 1  .  losartan (COZAAR) 100 MG tablet Take 1 tablet (100 mg total) by mouth daily. 30 tablet 2  . pantoprazole (PROTONIX) 40 MG tablet Take 1 tablet (40 mg total) by mouth 2 (two) times daily before a meal. 60 tablet 3  . pioglitazone (ACTOS) 15 MG tablet Take 1 tablet (15 mg total) by mouth daily. Take with largest meal of day. 90 tablet 1  . rosuvastatin (CRESTOR) 40 MG tablet Take 1 tablet (40 mg total) by mouth daily. 90 tablet 1  . sucralfate (CARAFATE) 1 G tablet Take 1 tablet (1 g total) by mouth 4 (four) times daily -  with meals and at bedtime. 90 tablet 1  . traMADol (ULTRAM) 50 MG tablet Take 1 tablet (50 mg total) by mouth 2  (two) times daily as needed. 60 tablet 1  . vitamin B-12 (CYANOCOBALAMIN) 1000 MCG tablet Take 1,000 mcg by mouth daily.    . Vitamin D, Cholecalciferol, 400 UNITS CAPS Take 400 Units by mouth daily.    . Blood Glucose Monitoring Suppl (ONE TOUCH ULTRA SYSTEM KIT) W/DEVICE KIT 1 kit by Does not apply route once. 1 each 0  . glucose blood (ONE TOUCH ULTRA TEST) test strip Use 1 test strip to test blood sugar levels 3 times daily 100 each 12  . ondansetron (ZOFRAN-ODT) 8 MG disintegrating tablet Take 1 tablet (8 mg total) by mouth 2 (two) times daily as needed for nausea or vomiting. (Patient not taking: Reported on 07/18/2015) 20 tablet 0  . ONETOUCH DELICA LANCETS FINE MISC Test twice daily. 180 each 2   No current facility-administered medications for this visit.    Allergies:   Codeine phosphate    ROS:  Please see the history of present illness.   Otherwise, review of systems are positive for she hears some bleeding and her heart and occasional skipped beats.   All other systems are reviewed and negative.    PHYSICAL EXAM: VS:  BP 134/80 mmHg  Pulse 80  Ht $R'5\' 2"'qa$  (1.575 m)  Wt 156 lb (70.761 kg)  BMI 28.53 kg/m2 , BMI Body mass index is 28.53 kg/(m^2). GENERAL:  Well appearing HEENT:  Pupils equal round and reactive, fundi not visualized, oral mucosa unremarkable NECK:  No jugular venous distention, waveform within normal limits, carotid upstroke brisk and symmetric, no bruits, no thyromegaly LYMPHATICS:  No cervical, inguinal adenopathy LUNGS:  Clear to auscultation bilaterally BACK:  No CVA tenderness CHEST:  Unremarkable HEART:  PMI not displaced or sustained,S1 and S2 within normal limits, no S3, no S4, no clicks, no rubs, no murmurs ABD:  Flat, positive bowel sounds normal in frequency in pitch, no bruits, no rebound, no guarding, no midline pulsatile mass, no hepatomegaly, no splenomegaly EXT:  2 plus pulses throughout, no edema, no cyanosis no clubbing SKIN:  No rashes no  nodules NEURO:  Cranial nerves II through XII grossly intact, motor grossly intact throughout PSYCH:  Cognitively intact, oriented to person place and time    EKG:  EKG is ordered today. The ekg ordered today demonstrates sinus rhythm, rate 80, axis within normal limits, possible old anteroseptal infarct although this EKG is unchanged from previous.   Recent Labs: 02/23/2015: ALT 17 05/25/2015: BUN 20; Creatinine, Ser 1.41*; Hemoglobin 12.7; Platelets 215.0; Potassium 3.9; Sodium 139; TSH 6.44*    Lipid Panel    Component Value Date/Time   CHOL 161 02/23/2015 1033   TRIG 243.0* 02/23/2015 1033   HDL 46.90 02/23/2015 1033   CHOLHDL 3 02/23/2015 1033  VLDL 48.6* 02/23/2015 1033   LDLCALC 176* 11/02/2014 1031   LDLDIRECT 75.0 02/23/2015 1033     Lab Results  Component Value Date   HGBA1C 6.2 05/25/2015     Wt Readings from Last 3 Encounters:  07/18/15 156 lb (70.761 kg)  05/25/15 156 lb (70.761 kg)  03/30/15 160 lb (72.576 kg)      Other studies Reviewed: Additional studies/ records that were reviewed today include: Echo and office records and labs. Review of the above records demonstrates:  Please see elsewhere in the note.     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 She has some mild septal hypertrophy but this is not clinically relevant. No change in therapy is indicated other than blood pressure control.  HTN:  Her blood pressure is well controlled and she will continue the meds as listed.  DYSPNEA:  The patient does have baseline long-standing dyspnea with multiple vascular risk factors 22 years of diabetes. Seems to be screened with a POET (Plain Old Exercise Treadmill).  I will bring the patient back for a POET (Plain Old Exercise Test). This will allow me to screen for obstructive coronary disease, risk  stratify and very importantly provide a prescription for exercise.   DYSLIPIDEMIA:  She has an excellent lipid profile and a reasonable A1c. She'll continue the meds as listed.   (Greater than 40 minutes reviewing all data with greater than 50% face to face with the patient).  Current medicines are reviewed at length with the patient today.  The patient does not have concerns regarding medicines.  The following changes have been made:  no change  Labs/ tests ordered today include:   Orders Placed This Encounter  Procedures  . Exercise Tolerance Test  . EKG 12-Lead     Disposition:   FU with as needed.    Signed, Minus Breeding, MD  07/18/2015 10:57 AM    [place]

## 2015-08-01 ENCOUNTER — Ambulatory Visit (HOSPITAL_COMMUNITY)
Admission: RE | Admit: 2015-08-01 | Discharge: 2015-08-01 | Disposition: A | Discharge status: Home / Self Care | Payer: Medicare Other | Source: Ambulatory Visit | Attending: Cardiology | Admitting: Cardiology

## 2015-08-01 DIAGNOSIS — I10 Essential (primary) hypertension: Secondary | ICD-10-CM | POA: Diagnosis not present

## 2015-08-01 LAB — EXERCISE TOLERANCE TEST
CSEPEW: 7 METS
CSEPHR: 104 %
CSEPPHR: 153 {beats}/min
Exercise duration (min): 4 min
Exercise duration (sec): 23 s
MPHR: 147 {beats}/min
RPE: 13
Rest HR: 80 {beats}/min

## 2015-08-13 ENCOUNTER — Telehealth: Payer: Self-pay | Admitting: Family Medicine

## 2015-08-13 DIAGNOSIS — E1129 Type 2 diabetes mellitus with other diabetic kidney complication: Secondary | ICD-10-CM

## 2015-08-13 MED ORDER — PIOGLITAZONE HCL 15 MG PO TABS: 15.0000 mg | ORAL_TABLET | Freq: Every day | ORAL | Status: DC

## 2015-08-13 NOTE — Telephone Encounter (Signed)
Rx sent to pharmacy   

## 2015-08-13 NOTE — Telephone Encounter (Signed)
Pioglitazone [facility]

## 2015-09-03 ENCOUNTER — Telehealth: Payer: Self-pay | Admitting: Family Medicine

## 2015-09-03 ENCOUNTER — Ambulatory Visit (INDEPENDENT_AMBULATORY_CARE_PROVIDER_SITE_OTHER): Payer: Medicare Other | Admitting: Family Medicine

## 2015-09-03 ENCOUNTER — Encounter: Payer: Self-pay | Admitting: Family Medicine

## 2015-09-03 VITALS — BP 144/79 | HR 98 | Temp 98.5°F | Resp 18 | Ht 62.5 in | Wt 152.0 lb

## 2015-09-03 DIAGNOSIS — J0100 Acute maxillary sinusitis, unspecified: Secondary | ICD-10-CM | POA: Diagnosis not present

## 2015-09-03 DIAGNOSIS — N898 Other specified noninflammatory disorders of vagina: Secondary | ICD-10-CM | POA: Insufficient documentation

## 2015-09-03 DIAGNOSIS — E038 Other specified hypothyroidism: Secondary | ICD-10-CM

## 2015-09-03 DIAGNOSIS — J320 Chronic maxillary sinusitis: Secondary | ICD-10-CM | POA: Insufficient documentation

## 2015-09-03 LAB — TSH: TSH: 1.76 u[IU]/mL (ref 0.35–4.50)

## 2015-09-03 MED ORDER — FLUTICASONE PROPIONATE 50 MCG/ACT NA SUSP: 2.0000 | Freq: Every day | NASAL | Status: DC

## 2015-09-03 MED ORDER — AMOXICILLIN-POT CLAVULANATE 875-125 MG PO TABS: 1.0000 | ORAL_TABLET | Freq: Two times a day (BID) | ORAL | Status: DC

## 2015-09-03 MED ORDER — PREDNISONE 20 MG PO TABS: 40.0000 mg | ORAL_TABLET | Freq: Every day | ORAL | Status: DC

## 2015-09-03 NOTE — Progress Notes (Signed)
   Subjective:    Patient ID: WLADYSLAWA KERNER, female    DOB: 03-10-1941, 74 y.o.   MRN: [id number]  HPI  Cough: Pt presents for a greater than 1 week h/o of productive cough, rhinorrhea, nasal congestion, Sore throat, chills, and  Decreased appt. She denies nausea, vomit, diarrhea, rash, fever. No sick contacts. She has a h/o asthma but has not needed to use her inhaler. Does not endorse chest tightness or wheeze. She has not received her flu shot this year and does not want it yet. She has been taking benadryl and tylenol for symptom relief, but feels she is getting worse.   Past Medical History  Diagnosis Date  . Diabetes mellitus     type 2  . Hyperlipidemia   . Hypertension   . Asthma   . DIABETES MELLITUS, TYPE II 11/09/2007  . 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 Diverticulosis 03/2011  . Fatigue 07/22/2011  . Visual disturbance of one eye 12/05/2011  . Stress incontinence   . History of rheumatoid arthritis     during 30's, was treated.   Allergies  Allergen Reactions  . Codeine Phosphate Nausea And Vomiting and Rash   Social History   Social History  . Marital Status: Widowed    Spouse Name: N/A  . Number of Children: 2  . Years of Education: N/A   Occupational History  . retired    Social History Main Topics  . Smoking status: Never Smoker   . Smokeless tobacco: Never Used  . Alcohol Use: No  . Drug Use: No  . Sexual Activity: No   Other Topics Concern  . Not on file   Social History Narrative   Ms. Semar is widowed. Her young grandson lives with her, for whom she shares custody with her daughter.      Review of Systems Negative, with the exception of above mentioned in HPI     Objective:   Physical Exam BP 144/79 mmHg  Pulse 98  Temp(Src) 98.5 F (36.9 C) (Oral)  Resp 18  Ht 5' 2.5" (1.588 m)  Wt 152 lb (68.947 kg)  BMI 27.34 kg/m2  SpO2 97% Gen: Afebrile. No acute distress. Nontoxic in appearance. Well developed, well nourished.  HENT: AT.  [place]. Bilateral TM visualized shiny/full. appearance. MMM, no orals. Bilateral nares with erythema and swelling. Throat with erythema, no exudates.No cough on exam. TTP right max sinus. Hoarseness present.  Eyes:Pupils Equal Round Reactive to light, Extraocular movements intact,  Conjunctiva without redness, discharge or icterus. Neck/lymp/endocrine: Supple,cervical ant lymphadenopathy CV: RRR  Chest: CTAB, no wheeze or crackles. Normal resp effort. Good air movement.  Abd: Soft. NTND. BS present.    Assessment & Plan:  1. Acute maxillary sinusitis, recurrence not specified - amoxicillin-clavulanate (AUGMENTIN) 875-125 MG tablet; Take 1 tablet by mouth 2 (two) times daily.  Dispense: 20 tablet; Refill: 0 - predniSONE (DELTASONE) 20 MG tablet; Take 2 tablets (40 mg total) by mouth daily with breakfast.  Dispense: 10 tablet; Refill: 0 - fluticasone (FLONASE) 50 MCG/ACT nasal spray; Place 2 sprays into both nostrils daily.  Dispense: 16 g; Refill: 6  2. Other specified hypothyroidism - 12 week re-check after dose change.  - TSH  3. Vaginal irritation - new complaint. Urinary frequency with irritaiton - Urinalysis, Routine w reflex microscopic  F/U PRN

## 2015-09-03 NOTE — Telephone Encounter (Signed)
Done

## 2015-09-03 NOTE — Telephone Encounter (Signed)
Please call pt her TSH is normal, continue the dose she is currently taking of thyroid medication.

## 2015-09-03 NOTE — Telephone Encounter (Signed)
Patient aware of results and recommendations.  Pt had no questions at this time.

## 2015-09-03 NOTE — Patient Instructions (Signed)
Sinusitis, Adult Sinusitis is redness, soreness, and inflammation of the paranasal sinuses. Paranasal sinuses are air pockets within the bones of your face. They are located beneath your eyes, in the middle of your forehead, and above your eyes. In healthy paranasal sinuses, mucus is able to drain out, and air is able to circulate through them by way of your nose. However, when your paranasal sinuses are inflamed, mucus and air can become trapped. This can allow bacteria and other germs to grow and cause infection. Sinusitis can develop quickly and last only a short time (acute) or continue over a long period (chronic). Sinusitis that lasts for more than 12 weeks is considered chronic. CAUSES Causes of sinusitis include:  Allergies.  Structural abnormalities, such as displacement of the cartilage that separates your nostrils (deviated septum), which can decrease the air flow through your nose and sinuses and affect sinus drainage.  Functional abnormalities, such as when the small hairs (cilia) that line your sinuses and help remove mucus do not work properly or are not present. SIGNS AND SYMPTOMS Symptoms of acute and chronic sinusitis are the same. The primary symptoms are pain and pressure around the affected sinuses. Other symptoms include:  Upper toothache.  Earache.  Headache.  Bad breath.  Decreased sense of smell and taste.  A cough, which worsens when you are lying flat.  Fatigue.  Fever.  Thick drainage from your nose, which often is green and may contain pus (purulent).  Swelling and warmth over the affected sinuses. DIAGNOSIS Your health care provider will perform a physical exam. During your exam, your health care provider may perform any of the following to help determine if you have acute sinusitis or chronic sinusitis:  Look in your nose for signs of abnormal growths in your nostrils (nasal polyps).  Tap over the affected sinus to check for signs of  infection.  View the inside of your sinuses using an imaging device that has a light attached (endoscope). If your health care provider suspects that you have chronic sinusitis, one or more of the following tests may be recommended:  Allergy tests.  Nasal culture. A sample of mucus is taken from your nose, sent to a lab, and screened for bacteria.  Nasal cytology. A sample of mucus is taken from your nose and examined by your health care provider to determine if your sinusitis is related to an allergy. TREATMENT Most cases of acute sinusitis are related to a viral infection and will resolve on their own within 10 days. Sometimes, medicines are prescribed to help relieve symptoms of both acute and chronic sinusitis. These may include pain medicines, decongestants, nasal steroid sprays, or saline sprays. However, for sinusitis related to a bacterial infection, your health care provider will prescribe antibiotic medicines. These are medicines that will help kill the bacteria causing the infection. Rarely, sinusitis is caused by a fungal infection. In these cases, your health care provider will prescribe antifungal medicine. For some cases of chronic sinusitis, surgery is needed. Generally, these are cases in which sinusitis recurs more than 3 times per year, despite other treatments. HOME CARE INSTRUCTIONS  Drink plenty of water. Water helps thin the mucus so your sinuses can drain more easily.  Use a humidifier.  Inhale steam 3-4 times a day (for example, sit in the bathroom with the shower running).  Apply a warm, moist washcloth to your face 3-4 times a day, or as directed by your health care provider.  Use saline nasal sprays to help   moisten and clean your sinuses.  Take medicines only as directed by your health care provider.  If you were prescribed either an antibiotic or antifungal medicine, finish it all even if you start to feel better. SEEK IMMEDIATE MEDICAL CARE IF:  You have  increasing pain or severe headaches.  You have nausea, vomiting, or drowsiness.  You have swelling around your face.  You have vision problems.  You have a stiff neck.  You have difficulty breathing.   This information is not intended to replace advice given to you by your health care provider. Make sure you discuss any questions you have with your health care provider.   Document Released: 09/01/2005 Document Revised: 09/22/2014 Document Reviewed: 09/16/2011 Elsevier Interactive Patient Education 2016 [employer].  Antibiotic, prednisone, floanse, hydrate, rest

## 2015-09-03 NOTE — Progress Notes (Signed)
Pre visit review using our clinic review tool, if applicable. No additional management support is needed unless otherwise documented below in the visit note. 

## 2015-09-03 NOTE — Telephone Encounter (Signed)
Please see note prior, accidentally closed

## 2015-09-04 LAB — URINALYSIS, ROUTINE W REFLEX MICROSCOPIC
Nitrite: NEGATIVE
PH: 6.5 (ref 5.0–8.0)
Specific Gravity, Urine: 1.015 (ref 1.000–1.030)
TOTAL PROTEIN, URINE-UPE24: 30 — AB
UROBILINOGEN UA: 0.2 (ref 0.0–1.0)
Urine Glucose: NEGATIVE

## 2015-09-05 ENCOUNTER — Telehealth: Payer: Self-pay | Admitting: Family Medicine

## 2015-09-05 DIAGNOSIS — R319 Hematuria, unspecified: Secondary | ICD-10-CM

## 2015-09-05 NOTE — Telephone Encounter (Signed)
Patient aware of results.  Pt states her urinary symptoms seem to be improving so she will wait on additional abx.  Pt aware to schedule 4 week lab visit but was unable to schedule at this time, she states she will CB to schedule.

## 2015-09-05 NOTE — Telephone Encounter (Signed)
Left message for patient to return call to review lab results and instructions. 

## 2015-09-05 NOTE — Telephone Encounter (Signed)
Please call pt: - her urine did show small blood and small WBC. This could be signs of infection. The antibiotic she was place on for her sinus infection could possibly treat her infection. - if she is having continue urinary symptoms I would to treat her with an additional abx. If she is not having symptoms any longer the sinus abx is likely covering it.  - Either decision, I will place an order for her to have urine collection (lab appt only) in 4 weeks to make certain urine is normal.  Please advise

## 2015-09-16 DIAGNOSIS — M8580 Other specified disorders of bone density and structure, unspecified site: Secondary | ICD-10-CM

## 2015-09-16 HISTORY — DX: Other specified disorders of bone density and structure, unspecified site: M85.80

## 2015-10-15 ENCOUNTER — Ambulatory Visit (INDEPENDENT_AMBULATORY_CARE_PROVIDER_SITE_OTHER): Payer: Medicare Other

## 2015-10-15 DIAGNOSIS — R319 Hematuria, unspecified: Secondary | ICD-10-CM

## 2015-10-15 DIAGNOSIS — Z23 Encounter for immunization: Secondary | ICD-10-CM

## 2015-10-15 LAB — URINALYSIS, ROUTINE W REFLEX MICROSCOPIC
BILIRUBIN URINE: NEGATIVE
Hgb urine dipstick: NEGATIVE
KETONES UR: NEGATIVE
LEUKOCYTES UA: NEGATIVE
NITRITE: NEGATIVE
RBC / HPF: NONE SEEN (ref 0–?)
SPECIFIC GRAVITY, URINE: 1.01 (ref 1.000–1.030)
Total Protein, Urine: NEGATIVE
URINE GLUCOSE: NEGATIVE
UROBILINOGEN UA: 0.2 (ref 0.0–1.0)
WBC UA: NONE SEEN (ref 0–?)
pH: 7.5 (ref 5.0–8.0)

## 2015-10-17 LAB — URINE CULTURE

## 2015-10-18 ENCOUNTER — Telehealth: Payer: Self-pay | Admitting: Family Medicine

## 2015-10-18 NOTE — Telephone Encounter (Signed)
Spoke with patient reviewed lab results. 

## 2015-10-18 NOTE — Telephone Encounter (Signed)
Please call pt: - her urine results are normal.

## 2015-10-23 ENCOUNTER — Telehealth: Payer: Self-pay | Admitting: *Deleted

## 2015-10-23 NOTE — Telephone Encounter (Signed)
Received refill request for lorazepam 1 mg directions 1/2 to 1 tablet twice daily as needed. This medication has not been filled since 10/24/14. Its not on her current med list. Last seen by you on 09/03/15 for sinus infection.

## 2015-10-23 NOTE — Telephone Encounter (Signed)
Pt will need to be seen for medication request of lorazepam. This medication has not been active for over a year and I have never evaluated her for this issue.

## 2015-10-23 NOTE — Telephone Encounter (Signed)
Spoke with patient reviewed information. Patient states she has a scheduled appt in a few weeks she will follow up then.

## 2015-11-05 ENCOUNTER — Other Ambulatory Visit: Payer: Self-pay | Admitting: *Deleted

## 2015-11-05 DIAGNOSIS — E039 Hypothyroidism, unspecified: Secondary | ICD-10-CM

## 2015-11-05 MED ORDER — LEVOTHYROXINE SODIUM 50 MCG PO TABS: 50.0000 ug | ORAL_TABLET | Freq: Every day | ORAL | Status: DC

## 2015-11-05 NOTE — Telephone Encounter (Signed)
Levothyroxine refilled per refill protocol

## 2015-12-04 ENCOUNTER — Other Ambulatory Visit: Payer: Self-pay | Admitting: *Deleted

## 2015-12-04 DIAGNOSIS — I10 Essential (primary) hypertension: Secondary | ICD-10-CM

## 2015-12-04 MED ORDER — LOSARTAN POTASSIUM 100 MG PO TABS
100.0000 mg | ORAL_TABLET | Freq: Every day | ORAL | Status: DC
Start: 2015-12-04 — End: 2016-01-16

## 2015-12-07 ENCOUNTER — Telehealth: Payer: Self-pay | Admitting: Family Medicine

## 2015-12-07 DIAGNOSIS — F329 Major depressive disorder, single episode, unspecified: Secondary | ICD-10-CM

## 2015-12-07 DIAGNOSIS — E1129 Type 2 diabetes mellitus with other diabetic kidney complication: Secondary | ICD-10-CM

## 2015-12-07 DIAGNOSIS — M109 Gout, unspecified: Secondary | ICD-10-CM

## 2015-12-07 DIAGNOSIS — F32A Depression, unspecified: Secondary | ICD-10-CM

## 2015-12-07 DIAGNOSIS — I10 Essential (primary) hypertension: Secondary | ICD-10-CM

## 2015-12-07 DIAGNOSIS — E785 Hyperlipidemia, unspecified: Secondary | ICD-10-CM

## 2015-12-07 MED ORDER — ALLOPURINOL 100 MG PO TABS: 100.0000 mg | ORAL_TABLET | Freq: Every day | ORAL | Status: DC

## 2015-12-07 MED ORDER — AMLODIPINE BESYLATE 10 MG PO TABS: 10.0000 mg | ORAL_TABLET | Freq: Every day | ORAL | Status: DC

## 2015-12-07 MED ORDER — FLUOXETINE HCL (PMDD) 20 MG PO CAPS: 1.0000 | ORAL_CAPSULE | Freq: Every day | ORAL | Status: DC

## 2015-12-07 MED ORDER — ROSUVASTATIN CALCIUM 40 MG PO TABS: 40.0000 mg | ORAL_TABLET | Freq: Every day | ORAL | Status: DC

## 2015-12-07 NOTE — Telephone Encounter (Signed)
This is a Dr. Raoul Pitch pt.

## 2015-12-07 NOTE — Telephone Encounter (Signed)
Pt would like a refill on her allopurinol, amlodipine, prozac and generic crestor sent to [facility] in [place].

## 2015-12-18 ENCOUNTER — Other Ambulatory Visit: Payer: Self-pay

## 2015-12-18 DIAGNOSIS — Z1231 Encounter for screening mammogram for malignant neoplasm of breast: Secondary | ICD-10-CM

## 2016-01-16 ENCOUNTER — Other Ambulatory Visit: Payer: Self-pay | Admitting: *Deleted

## 2016-01-16 DIAGNOSIS — I10 Essential (primary) hypertension: Secondary | ICD-10-CM

## 2016-01-16 MED ORDER — LOSARTAN POTASSIUM 100 MG PO TABS: 100.0000 mg | ORAL_TABLET | Freq: Every day | ORAL | Status: DC

## 2016-01-16 NOTE — Telephone Encounter (Signed)
Losartan refilled per refill protocol

## 2016-01-29 ENCOUNTER — Ambulatory Visit
Admission: RE | Admit: 2016-01-29 | Discharge: 2016-01-29 | Disposition: A | Discharge status: Home / Self Care | Payer: Medicare Other | Source: Ambulatory Visit

## 2016-01-29 DIAGNOSIS — Z1231 Encounter for screening mammogram for malignant neoplasm of breast: Secondary | ICD-10-CM

## 2016-02-05 ENCOUNTER — Other Ambulatory Visit: Payer: Self-pay

## 2016-02-06 MED ORDER — PANTOPRAZOLE SODIUM 40 MG PO TBEC: 40.0000 mg | DELAYED_RELEASE_TABLET | Freq: Two times a day (BID) | ORAL | Status: DC

## 2016-02-14 ENCOUNTER — Encounter: Payer: Self-pay | Admitting: Family Medicine

## 2016-02-14 ENCOUNTER — Ambulatory Visit (INDEPENDENT_AMBULATORY_CARE_PROVIDER_SITE_OTHER): Payer: Medicare Other | Admitting: Family Medicine

## 2016-02-14 VITALS — BP 122/66 | HR 71 | Temp 98.1°F | Resp 20 | Ht 62.5 in | Wt 153.8 lb

## 2016-02-14 DIAGNOSIS — E1122 Type 2 diabetes mellitus with diabetic chronic kidney disease: Secondary | ICD-10-CM

## 2016-02-14 DIAGNOSIS — E039 Hypothyroidism, unspecified: Secondary | ICD-10-CM | POA: Diagnosis not present

## 2016-02-14 DIAGNOSIS — N1831 Chronic kidney disease, stage 3a: Secondary | ICD-10-CM | POA: Insufficient documentation

## 2016-02-14 DIAGNOSIS — N183 Chronic kidney disease, stage 3 unspecified: Secondary | ICD-10-CM

## 2016-02-14 DIAGNOSIS — M109 Gout, unspecified: Secondary | ICD-10-CM

## 2016-02-14 DIAGNOSIS — M1000 Idiopathic gout, unspecified site: Secondary | ICD-10-CM

## 2016-02-14 HISTORY — DX: Chronic kidney disease, stage 3a: N18.31

## 2016-02-14 LAB — URIC ACID: Uric Acid, Serum: 4.5 mg/dL (ref 2.4–7.0)

## 2016-02-14 LAB — CBC WITH DIFFERENTIAL/PLATELET
BASOS PCT: 0.9 % (ref 0.0–3.0)
Basophils Absolute: 0.1 10*3/uL (ref 0.0–0.1)
EOS PCT: 5.3 % — AB (ref 0.0–5.0)
Eosinophils Absolute: 0.4 10*3/uL (ref 0.0–0.7)
HCT: 38.7 % (ref 36.0–46.0)
HEMOGLOBIN: 12.8 g/dL (ref 12.0–15.0)
LYMPHS ABS: 2.2 10*3/uL (ref 0.7–4.0)
Lymphocytes Relative: 28.9 % (ref 12.0–46.0)
MCHC: 33.2 g/dL (ref 30.0–36.0)
MCV: 84.8 fl (ref 78.0–100.0)
MONO ABS: 0.7 10*3/uL (ref 0.1–1.0)
Monocytes Relative: 8.9 % (ref 3.0–12.0)
Neutro Abs: 4.3 10*3/uL (ref 1.4–7.7)
Neutrophils Relative %: 56 % (ref 43.0–77.0)
Platelets: 199 10*3/uL (ref 150.0–400.0)
RBC: 4.56 Mil/uL (ref 3.87–5.11)
RDW: 15.5 % (ref 11.5–15.5)
WBC: 7.7 10*3/uL (ref 4.0–10.5)

## 2016-02-14 LAB — BASIC METABOLIC PANEL
BUN: 27 mg/dL — AB (ref 6–23)
CHLORIDE: 101 meq/L (ref 96–112)
CO2: 27 meq/L (ref 19–32)
Calcium: 9.8 mg/dL (ref 8.4–10.5)
Creatinine, Ser: 1.29 mg/dL — ABNORMAL HIGH (ref 0.40–1.20)
GFR: 42.96 mL/min — ABNORMAL LOW (ref >60.00)
GLUCOSE: 98 mg/dL (ref 70–99)
POTASSIUM: 4.5 meq/L (ref 3.5–5.1)
Sodium: 137 mEq/L (ref 135–145)

## 2016-02-14 LAB — HEMOGLOBIN A1C: HEMOGLOBIN A1C: 6.1 % (ref 4.6–6.5)

## 2016-02-14 MED ORDER — METHYLPREDNISOLONE ACETATE 80 MG/ML IJ SUSP
80.0000 mg | Freq: Once | INTRAMUSCULAR | Status: AC
Start: 2016-02-14 — End: 2016-02-14
  Administered 2016-02-14: 80 mg via INTRAMUSCULAR

## 2016-02-14 MED ORDER — PREDNISONE 20 MG PO TABS: ORAL_TABLET | ORAL | Status: DC

## 2016-02-14 MED ORDER — LEVOTHYROXINE SODIUM 50 MCG PO TABS: 50.0000 ug | ORAL_TABLET | Freq: Every day | ORAL | Status: DC

## 2016-02-14 NOTE — Patient Instructions (Addendum)

## 2016-02-14 NOTE — Progress Notes (Signed)
Patient ID: Elizabeth Mcintyre, female   DOB: [birth year], 75 y.o.   MRN: [id number]    Elizabeth Mcintyre , 1940-10-21, 75 y.o., female MRN: [id number]  CC: Gout  Subjective: Pt presents for an acute OV with complaints of right large toe swelling and redness of *3 days duration. Patient has a history of gout. She does not watch her diet. She is on allopurinol 100 mg for control. Her last gout flare was last summer. She is also a diabetic and has not followed up on her diabetes for 10 months. Pts last GFR was 05/2015 and 38. She states her blood sugars are usually fasting <130 every morning. She denies fever, chills, nausea, trauma to her foot. She has tried taking tylenol to help her with discomfort. Her pain is becoming worse, and located in the same toe as prior gout flare.  Allergies  Allergen Reactions  . Codeine Phosphate Nausea And Vomiting and Rash   Social History  Substance Use Topics  . Smoking status: Never Smoker   . Smokeless tobacco: Never Used  . Alcohol Use: No   Past Medical History  Diagnosis Date  . Diabetes mellitus     type 2  . Hyperlipidemia   . Hypertension   . Asthma   . DIABETES MELLITUS, TYPE II 11/09/2007  . 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 Diverticulosis 03/2011  . Fatigue 07/22/2011  . Visual disturbance of one eye 12/05/2011  . Stress incontinence   . History of rheumatoid arthritis     during 30's, was treated.   Past Surgical History  Procedure Laterality Date  . Abdominal hysterectomy    . Cardiac catheterization      X 2, last one in 1998  . Colonoscopy    . Colonoscopy  May 2012    Dr. Olevia Perches: mild diverticulosis, otherwise normal.   . Esophagogastroduodenoscopy N/A 01/28/2015    Dr. Gala Romney: reflux esophagitis, Schatzki's ring not manipulated due to recent bleeding  . Esophagogastroduodenoscopy N/A 03/30/2015    Procedure: ESOPHAGOGASTRODUODENOSCOPY (EGD);  Surgeon: Daneil Dolin, MD;  Location: [facility];  Service: Endoscopy;  Laterality:  N/A;  1100  . Maloney dilation N/A 03/30/2015    Procedure: Venia Minks DILATION;  Surgeon: Daneil Dolin, MD;  Location: [facility];  Service: Endoscopy;  Laterality: N/A;   Family History  Problem Relation Age of Onset  . Heart disease Mother   . Osteoarthritis Mother   . Sudden death Father   . Single kidney Father   . Other Father     h/o severe MVA injuries  . Hyperlipidemia Sister   . Other Daughter     Myalgias  . Fibromyalgia Daughter   . Allergies Daughter   . Heart disease Maternal Grandfather   . Sudden death Paternal Grandmother   . Diabetes Paternal Grandfather   . Heart disease Daughter   . Other Daughter     palpitations  . Pulmonary fibrosis Maternal Aunt   . Cancer Paternal Uncle   . Pulmonary fibrosis Maternal Aunt   . Colon cancer Neg Hx      Medication List       This list is accurate as of: 02/14/16 11:33 AM.  Always use your most recent med list.               albuterol 108 (90 Base) MCG/ACT inhaler  Commonly known as:  PROVENTIL HFA;VENTOLIN HFA  Inhale 2 puffs into the lungs every 6 (six) hours as needed for wheezing.  allopurinol 100 MG tablet  Commonly known as:  ZYLOPRIM  Take 1 tablet (100 mg total) by mouth daily.     amLODipine 10 MG tablet  Commonly known as:  NORVASC  Take 1 tablet (10 mg total) by mouth daily.     Fluoxetine HCl (PMDD) 20 MG Caps  Take 1 capsule (20 mg total) by mouth daily.     fluticasone 50 MCG/ACT nasal spray  Commonly known as:  FLONASE  Place 2 sprays into both nostrils daily.     glucose blood test strip  Commonly known as:  ONE TOUCH ULTRA TEST  Use 1 test strip to test blood sugar levels 3 times daily     levothyroxine 50 MCG tablet  Commonly known as:  SYNTHROID, LEVOTHROID  Take 1 tablet (50 mcg total) by mouth daily before breakfast.     losartan 100 MG tablet  Commonly known as:  COZAAR  Take 1 tablet (100 mg total) by mouth daily.     ONE TOUCH ULTRA SYSTEM KIT w/Device Kit  1 kit by  Does not apply route once.     ONETOUCH DELICA LANCETS FINE Misc  Test twice daily.     pantoprazole 40 MG tablet  Commonly known as:  PROTONIX  Take 1 tablet (40 mg total) by mouth 2 (two) times daily before a meal.     pioglitazone 15 MG tablet  Commonly known as:  ACTOS  Take 1 tablet (15 mg total) by mouth daily. Take with largest meal of day.     rosuvastatin 40 MG tablet  Commonly known as:  CRESTOR  Take 1 tablet (40 mg total) by mouth daily.     sucralfate 1 g tablet  Commonly known as:  CARAFATE  Take 1 tablet (1 g total) by mouth 4 (four) times daily -  with meals and at bedtime.     traMADol 50 MG tablet  Commonly known as:  ULTRAM  Take 1 tablet (50 mg total) by mouth 2 (two) times daily as needed.     vitamin B-12 1000 MCG tablet  Commonly known as:  CYANOCOBALAMIN  Take 1,000 mcg by mouth daily.     Vitamin D (Cholecalciferol) 400 units Caps  Take 400 Units by mouth daily.       ROS: Negative, with the exception of above mentioned in HPI  Objective:  BP 122/66 mmHg  Pulse 71  Temp(Src) 98.1 F (36.7 C) (Oral)  Resp 20  Ht 5' 2.5" (1.588 m)  Wt 153 lb 12.8 oz (69.763 kg)  BMI 27.66 kg/m2  SpO2 100% Body mass index is 27.66 kg/(m^2). Gen: Afebrile. No acute distress. Nontoxic in appearance, well developed, well nourished, caucasian female. Pleasant.  HENT: AT. [place].MMM, no oral lesions.  Eyes:Pupils Equal Round Reactive to light, Extraocular movements intact,  Conjunctiva without redness, discharge or icterus. CV: RRR  Chest: CTAB, no wheeze or crackles. Good air movement, normal resp effort.  Abd: Soft. NTND. BS present MSK: Right 1st metatarsal joint with erythema and swelling. TTP. Decreased ROM 2/2/ to pain.  Skin: No rashes, purpura or petechiae. Warm and well perfused, skin intact Neuro: limping. PERLA. EOMi. Alert. Oriented x3  Psych: Normal affect, dress and demeanor. Normal speech. Normal thought content and  judgment.  Assessment/Plan: Elizabeth Mcintyre is a 75 y.o. female present for acute OV for  1. Gout of big toe - continue allopurinol, will check kidney function.  - attempt to avoid NSAIDS and colchicine with last records GFR.  -  R/O infection process with CBC - Uric acid - Basic Metabolic Panel (BMET) - CBC w/Diff - predniSONE (DELTASONE) 20 MG tablet; 60 mg x3d, 40 mg x3d, 20 mg x2d, 10 mg x2d  Dispense: 18 tablet; Refill: 0 - methylPREDNISolone acetate (DEPO-MEDROL) injection 80 mg; Inject 1 mL (80 mg total) into the muscle once. - Pt encouraged to take tylenol, if pain not controlled once steroid started will call in medication for her. Avoid NSAIDS if possible.   2. 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 She will need to follow every 4 months for chronic medical conditions (DM/HTN) - Basic Metabolic Panel (BMET); Bmet every 6 months - Continue Actos, diabetic diet and daily monitoring - HgB A1c - Must f/u every 4 months.  3. Hypothyroidism, unspecified hypothyroidism type - last TSH 6 months ago normal, refills today.  - levothyroxine (SYNTHROID, LEVOTHROID) 50 MCG tablet; Take 1 tablet (50 mcg total) by mouth daily before breakfast.  Dispense: 30 tablet; Refill: 3  electronically signed by:  Howard Pouch, DO  [place]

## 2016-02-15 ENCOUNTER — Telehealth: Payer: Self-pay | Admitting: Family Medicine

## 2016-02-15 NOTE — Telephone Encounter (Signed)
Please call pt: - her labs are stable. Kidney function good, she does need to be encouraged to drink more water.  - Her a1c was 6.1, decent control, continue current actos dose and followup 3-4 months.  - F/U next week if foot/gout not improving.

## 2016-02-15 NOTE — Telephone Encounter (Signed)
Spoke with patient reviewed lab results and instructions. Patient verbalized understanding. 

## 2016-02-22 ENCOUNTER — Telehealth: Payer: Self-pay | Admitting: Family Medicine

## 2016-02-22 ENCOUNTER — Other Ambulatory Visit: Payer: Self-pay | Admitting: Family Medicine

## 2016-02-22 DIAGNOSIS — F419 Anxiety disorder, unspecified: Secondary | ICD-10-CM

## 2016-02-22 MED ORDER — LORAZEPAM 0.5 MG PO TABS: ORAL_TABLET | ORAL | Status: DC

## 2016-02-22 NOTE — Progress Notes (Signed)
Ativan 0.5mg  provided, short term script only.

## 2016-02-22 NOTE — Telephone Encounter (Signed)
Patient requesting lorazepam.  She states she has used it in the past but very rarely.  She states her husband died on this day a few years ago and she is just having a rough time with it.  She is okay with only getting 3 or 4 tablets.  Please advise.

## 2016-02-25 NOTE — Telephone Encounter (Signed)
Spoke with patient on 02/22/16 let her know Rx was sent.

## 2016-02-27 ENCOUNTER — Other Ambulatory Visit: Payer: Self-pay | Admitting: *Deleted

## 2016-02-27 DIAGNOSIS — I10 Essential (primary) hypertension: Secondary | ICD-10-CM

## 2016-02-27 MED ORDER — LOSARTAN POTASSIUM 100 MG PO TABS: 100.0000 mg | ORAL_TABLET | Freq: Every day | ORAL | Status: DC

## 2016-02-27 NOTE — Telephone Encounter (Signed)
Losartan refilled 

## 2016-03-24 ENCOUNTER — Encounter: Payer: Self-pay | Admitting: Internal Medicine

## 2016-04-04 ENCOUNTER — Other Ambulatory Visit: Payer: Self-pay | Admitting: *Deleted

## 2016-04-04 MED ORDER — SUCRALFATE 1 G PO TABS: 1.0000 g | ORAL_TABLET | Freq: Three times a day (TID) | ORAL | Status: DC

## 2016-04-04 NOTE — Telephone Encounter (Signed)
RF request for sucralfate LOV: 02/14/16 Next ov: None Last written: 05/11/15 #90 w/ 1RF

## 2016-05-27 ENCOUNTER — Telehealth: Payer: Self-pay | Admitting: Family Medicine

## 2016-05-27 NOTE — Telephone Encounter (Signed)
Pioglitazone [facility]

## 2016-05-28 ENCOUNTER — Other Ambulatory Visit: Payer: Self-pay | Admitting: Family Medicine

## 2016-05-28 ENCOUNTER — Other Ambulatory Visit: Payer: Self-pay | Admitting: *Deleted

## 2016-05-28 DIAGNOSIS — E1129 Type 2 diabetes mellitus with other diabetic kidney complication: Secondary | ICD-10-CM

## 2016-05-28 MED ORDER — PIOGLITAZONE HCL 15 MG PO TABS: 15.0000 mg | ORAL_TABLET | Freq: Every day | ORAL | 1 refills | Status: DC

## 2016-05-28 NOTE — Telephone Encounter (Signed)
actos refilled

## 2016-05-28 NOTE — Telephone Encounter (Signed)
Dr Raoul Pitch patient.  Will forward RF request to [place].

## 2016-06-04 NOTE — Telephone Encounter (Signed)
[facility]28[facility]16 in patient's chart. Closing note

## 2016-06-06 ENCOUNTER — Encounter: Payer: Self-pay | Admitting: Family Medicine

## 2016-06-06 ENCOUNTER — Ambulatory Visit (INDEPENDENT_AMBULATORY_CARE_PROVIDER_SITE_OTHER): Payer: Medicare Other | Admitting: Family Medicine

## 2016-06-06 VITALS — Temp 98.0°F | Resp 20 | Wt 157.8 lb

## 2016-06-06 DIAGNOSIS — R42 Dizziness and giddiness: Secondary | ICD-10-CM | POA: Insufficient documentation

## 2016-06-06 DIAGNOSIS — R8290 Unspecified abnormal findings in urine: Secondary | ICD-10-CM | POA: Diagnosis not present

## 2016-06-06 DIAGNOSIS — N183 Chronic kidney disease, stage 3 unspecified: Secondary | ICD-10-CM

## 2016-06-06 DIAGNOSIS — E1122 Type 2 diabetes mellitus with diabetic chronic kidney disease: Secondary | ICD-10-CM | POA: Diagnosis not present

## 2016-06-06 LAB — POC URINALSYSI DIPSTICK (AUTOMATED)
Bilirubin, UA: NEGATIVE
GLUCOSE UA: NEGATIVE
KETONES UA: NEGATIVE
Nitrite, UA: NEGATIVE
SPEC GRAV UA: 1.015
Urobilinogen, UA: 0.2
pH, UA: 7

## 2016-06-06 LAB — BASIC METABOLIC PANEL
BUN: 21 mg/dL (ref 6–23)
CALCIUM: 9.2 mg/dL (ref 8.4–10.5)
CHLORIDE: 104 meq/L (ref 96–112)
CO2: 26 mEq/L (ref 19–32)
CREATININE: 1.32 mg/dL — AB (ref 0.40–1.20)
GFR: 41.8 mL/min — ABNORMAL LOW (ref >60.00)
Glucose, Bld: 101 mg/dL — ABNORMAL HIGH (ref 70–99)
Potassium: 4.2 mEq/L (ref 3.5–5.1)
Sodium: 138 mEq/L (ref 135–145)

## 2016-06-06 LAB — CBC WITH DIFFERENTIAL/PLATELET
BASOS PCT: 0.9 % (ref 0.0–3.0)
Basophils Absolute: 0.1 10*3/uL (ref 0.0–0.1)
EOS PCT: 7 % — AB (ref 0.0–5.0)
Eosinophils Absolute: 0.4 10*3/uL (ref 0.0–0.7)
HEMATOCRIT: 36.3 % (ref 36.0–46.0)
Hemoglobin: 12.2 g/dL (ref 12.0–15.0)
LYMPHS ABS: 2.1 10*3/uL (ref 0.7–4.0)
LYMPHS PCT: 34.1 % (ref 12.0–46.0)
MCHC: 33.7 g/dL (ref 30.0–36.0)
MCV: 86.2 fl (ref 78.0–100.0)
MONOS PCT: 9.2 % (ref 3.0–12.0)
Monocytes Absolute: 0.6 10*3/uL (ref 0.1–1.0)
NEUTROS ABS: 3 10*3/uL (ref 1.4–7.7)
Neutrophils Relative %: 48.8 % (ref 43.0–77.0)
PLATELETS: 188 10*3/uL (ref 150.0–400.0)
RBC: 4.21 Mil/uL (ref 3.87–5.11)
RDW: 13.9 % (ref 11.5–15.5)
WBC: 6.2 10*3/uL (ref 4.0–10.5)

## 2016-06-06 LAB — POCT GLYCOSYLATED HEMOGLOBIN (HGB A1C): Hemoglobin A1C: 5.4

## 2016-06-06 LAB — GLUCOSE, POCT (MANUAL RESULT ENTRY): POC Glucose: 114 mg/dl — AB (ref 70–99)

## 2016-06-06 MED ORDER — AMOXICILLIN-POT CLAVULANATE 875-125 MG PO TABS: 1.0000 | ORAL_TABLET | Freq: Two times a day (BID) | ORAL | 0 refills | Status: DC

## 2016-06-06 NOTE — Patient Instructions (Addendum)
Stop actos today, monitor sugars fasting and write them down, bring them with you at next appt.  Drink plenty of fluids, stay hydrated.  Start medication for urinary tract infection today.  I will call you with results once available.  If worsening over next few days be seen immediately.  Follow up in 1 week.

## 2016-06-06 NOTE — Progress Notes (Signed)
Elizabeth Mcintyre , 05/03/1941, 75 y.o., female MRN: [id number] Patient Care Team    Relationship Specialty Notifications Start End  Ma Hillock, DO PCP - General Family Medicine  05/25/15   Danie Binder, MD Consulting Physician Gastroenterology  01/29/15   Daneil Dolin, MD Consulting Physician Gastroenterology  05/14/15     CC: Dizziness Subjective: Pt presents for an acute OV with complaints of dizziness of 3 weeks duration. She endorses right ear pressure, cold sweats, swollen lower extremities (resolved), intermittent tingling of the left side of her face for the past week, dizziness upon standing and leaning forward, nauseated, loss of appetite, constipation. She reports one night she had extreme dizziness and had to lay on the bathroom floor, then he severe episode of diarrhea 1 and dizziness resolved. She denies dark or bloody stools. She endorses right flank pain that radiates to her lower abdomen over the last month, worsening over the last week. She endorses itchiness with urination, but denies burning or frequency of urination. She does not have a history of kidney stones. She has a history of diabetes with fasting blood sugars between 90-120. She has a history of GI bleed, but denies melena, hematochezia or color change in her bowel habits.  Allergies  Allergen Reactions  . Codeine Phosphate Nausea And Vomiting and Rash   Social History  Substance Use Topics  . Smoking status: Never Smoker  . Smokeless tobacco: Never Used  . Alcohol use No   Past Medical History:  Diagnosis Date  . Asthma   . 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 Diabetes mellitus    type 2  . DIABETES MELLITUS, TYPE II 11/09/2007  . Diverticulosis 03/2011  . Fatigue 07/22/2011  . History of rheumatoid arthritis    during 30's, was treated.  . Hyperlipidemia   . Hypertension   . Stress incontinence   . Visual disturbance of one eye 12/05/2011   Past Surgical History:  Procedure Laterality Date  .  ABDOMINAL HYSTERECTOMY    . CARDIAC CATHETERIZATION     X 2, last one in 1998  . COLONOSCOPY    . COLONOSCOPY  May 2012   Dr. Olevia Perches: mild diverticulosis, otherwise normal.   . ESOPHAGOGASTRODUODENOSCOPY N/A 01/28/2015   Dr. Gala Romney: reflux esophagitis, Schatzki's ring not manipulated due to recent bleeding  . ESOPHAGOGASTRODUODENOSCOPY N/A 03/30/2015   Procedure: ESOPHAGOGASTRODUODENOSCOPY (EGD);  Surgeon: Daneil Dolin, MD;  [facility];  Service: Endoscopy;  Laterality: N/A;  1100  . MALONEY DILATION N/A 03/30/2015   Procedure: Venia Minks DILATION;  Surgeon: Daneil Dolin, MD;  Location: [facility];  Service: Endoscopy;  Laterality: N/A;   Family History  Problem Relation Age of Onset  . Heart disease Mother   . Osteoarthritis Mother   . Sudden death Father   . Single kidney Father   . Other Father     h/o severe MVA injuries  . Hyperlipidemia Sister   . Other Daughter     Myalgias  . Fibromyalgia Daughter   . Allergies Daughter   . Heart disease Maternal Grandfather   . Sudden death Paternal Grandmother   . Diabetes Paternal Grandfather   . Heart disease Daughter   . Other Daughter     palpitations  . Pulmonary fibrosis Maternal Aunt   . Cancer Paternal Uncle   . Pulmonary fibrosis Maternal Aunt   . Colon cancer Neg Hx      Medication List  Accurate as of 06/06/16  9:09 AM. Always use your most recent med list.          albuterol 108 (90 Base) MCG/ACT inhaler Commonly known as:  PROVENTIL HFA;VENTOLIN HFA Inhale 2 puffs into the lungs every 6 (six) hours as needed for wheezing.   allopurinol 100 MG tablet Commonly known as:  ZYLOPRIM Take 1 tablet (100 mg total) by mouth daily.   amLODipine 10 MG tablet Commonly known as:  NORVASC Take 1 tablet (10 mg total) by mouth daily.   Fluoxetine HCl (PMDD) 20 MG Caps Take 1 capsule (20 mg total) by mouth daily.   fluticasone 50 MCG/ACT nasal spray Commonly known as:  FLONASE Place 2 sprays into  both nostrils daily.   glucose blood test strip Commonly known as:  ONE TOUCH ULTRA TEST Use 1 test strip to test blood sugar levels 3 times daily   levothyroxine 50 MCG tablet Commonly known as:  SYNTHROID, LEVOTHROID Take 1 tablet (50 mcg total) by mouth daily before breakfast.   LORazepam 0.5 MG tablet Commonly known as:  ATIVAN For stress and insomnia   losartan 100 MG tablet Commonly known as:  COZAAR Take 1 tablet (100 mg total) by mouth daily.   ONE TOUCH ULTRA SYSTEM KIT w/Device Kit 1 kit by Does not apply route once.   ONETOUCH DELICA LANCETS FINE Misc Test twice daily.   pantoprazole 40 MG tablet Commonly known as:  PROTONIX Take 1 tablet (40 mg total) by mouth 2 (two) times daily before a meal.   pioglitazone 15 MG tablet Commonly known as:  ACTOS Take 1 tablet (15 mg total) by mouth daily. Take with largest meal of day.   rosuvastatin 40 MG tablet Commonly known as:  CRESTOR Take 1 tablet (40 mg total) by mouth daily.   sucralfate 1 g tablet Commonly known as:  CARAFATE Take 1 tablet (1 g total) by mouth 4 (four) times daily -  with meals and at bedtime.   traMADol 50 MG tablet Commonly known as:  ULTRAM Take 1 tablet (50 mg total) by mouth 2 (two) times daily as needed.   vitamin B-12 1000 MCG tablet Commonly known as:  CYANOCOBALAMIN Take 1,000 mcg by mouth daily.   Vitamin D (Cholecalciferol) 400 units Caps Take 400 Units by mouth daily.       No results found for this or any previous visit (from the past 24 hour(s)). No results found.   ROS: Negative, with the exception of above mentioned in HPI   Objective:  Temp 98 F (36.7 C)   Resp 20   Wt 157 lb 12 oz (71.6 kg)   SpO2 98%   BMI 28.39 kg/m  Body mass index is 28.39 kg/m.  Orthostatics documented.  Gen: Afebrile. No acute distress. Nontoxic in appearance, well developed, well nourished. Pleasant Caucasian female. HENT: AT. [place]. Bilateral TM visualized without erythema or  bulging. MMM, no oral lesions. Bilateral nares mild erythema, no swelling. Throat without erythema or exudates. No cough on exam, no hoarseness on exam. Eyes:Pupils Equal Round Reactive to light, Extraocular movements intact,  Conjunctiva without redness, discharge or icterus. Neck/lymp/endocrine: Supple, no lymphadenopathy CV: RRR, no edema Chest: CTAB, no wheeze or crackles. Good air movement, normal resp effort.  Abd: Soft. Flat. NTND. BS present. No Masses palpated. No rebound or guarding.  Skin: No rashes, purpura or petechiae.  Neuro: Slow Standing. Dizziness upon standing, resolves within seconds. PERLA. EOMi. Alert. Oriented x3. Cranial nerves II through XII  intact. Muscle strength 5/5 bilateral upper and lower extremity. Sensation intact. Negative Romberg, negative pronator drift.  Psych: Normal affect, dress and demeanor. Normal speech. Normal thought content and judgment.  Urinalysis    Component Value Date/Time   COLORURINE YELLOW 10/15/2015 1309   APPEARANCEUR CLEAR 10/15/2015 1309   LABSPEC 1.010 10/15/2015 1309   PHURINE 7.5 10/15/2015 1309   GLUCOSEU NEGATIVE 10/15/2015 1309   HGBUR NEGATIVE 10/15/2015 1309   HGBUR trace-intact 11/26/2010 1021   BILIRUBINUR neg 06/06/2016 0932   KETONESUR NEGATIVE 10/15/2015 1309   PROTEINUR trace 06/06/2016 0932   UROBILINOGEN 0.2 06/06/2016 0932   UROBILINOGEN 0.2 10/15/2015 1309   NITRITE neg 06/06/2016 0932   NITRITE NEGATIVE 10/15/2015 1309   LEUKOCYTESUR small (1+) (A) 06/06/2016 0932     Assessment/Plan: GLENDINE SWETZ is a 75 y.o. female present for acute OV for  Dizziness:  - UA- positive leukocytes and blood. Sent for urine culture. - Point of care glucose 114. - a1c- 5.4 - BMP and CBC collected. - Uncertain etiology of dizziness. Not orthostatic on examination today by numbers, however she was symptomatic upon standing. Urine possibly with infection, no obvious sign of sinus infection but again is also a possibility  with reports of symptoms. A1c is lower than desired for age and there is a possibility of lower blood sugars. - Discontinue Actos. Continue to monitor fasting blood glucose daily, monitor blood sugars and if fasting glucose is greater than 130, she will restart the Actos every other day with a meal. - Treat with Augmentin, should cover possible sinus infection and potentially UTI if culture is positive. - Patient is to hydrate, increasing her water intake daily. episodes lasting only a few seconds (< 1 m) more consistent with BPPV.  - Patient is to follow-up in 7-10 days, sooner if condition is worsening. If not resolved would consider imaging.   > 25 minutes spent with patient, >50% of time spent face to face counseling patient and coordinating care.   electronically signed by:  Howard Pouch, DO  [place]

## 2016-06-08 LAB — URINE CULTURE

## 2016-06-09 ENCOUNTER — Telehealth: Payer: Self-pay | Admitting: Family Medicine

## 2016-06-09 NOTE — Telephone Encounter (Signed)
Spoke with patient reviewed lab results. 

## 2016-06-09 NOTE — Telephone Encounter (Signed)
Her labs are stable/normal. We will review in detail at her follow up this week.

## 2016-06-13 ENCOUNTER — Encounter: Payer: Self-pay | Admitting: Family Medicine

## 2016-06-13 ENCOUNTER — Ambulatory Visit (INDEPENDENT_AMBULATORY_CARE_PROVIDER_SITE_OTHER): Payer: Medicare Other | Admitting: Family Medicine

## 2016-06-13 VITALS — BP 135/83 | HR 76 | Temp 97.8°F | Resp 20 | Wt 155.8 lb

## 2016-06-13 DIAGNOSIS — I6521 Occlusion and stenosis of right carotid artery: Secondary | ICD-10-CM | POA: Diagnosis not present

## 2016-06-13 DIAGNOSIS — R42 Dizziness and giddiness: Secondary | ICD-10-CM

## 2016-06-13 DIAGNOSIS — I6529 Occlusion and stenosis of unspecified carotid artery: Secondary | ICD-10-CM

## 2016-06-13 HISTORY — DX: Occlusion and stenosis of unspecified carotid artery: I65.29

## 2016-06-13 NOTE — Patient Instructions (Signed)
Continue hydration Finish antibiotic Schedule [place] of carotid (they will call) Keep monitoring sugars, if above 135 consistently restart Actos every other day.

## 2016-06-13 NOTE — Progress Notes (Signed)
Elizabeth Mcintyre , August 30, 1941, 75 y.o., female MRN: [id number] Patient Care Team    Relationship Specialty Notifications Start End  Ma Hillock, DO PCP - General Family Medicine  05/25/15   Danie Binder, MD Consulting Physician Gastroenterology  01/29/15   Daneil Dolin, MD Consulting Physician Gastroenterology  05/14/15     CC: Dizziness Subjective:  Patient reports her dizziness is a little improved. She has stopped the actos and her highest fasting sugar is 136, most of them are 120's. She has increased her water consumption daily. She is almost finished with the antibiotic. Her sinus issues have cleared with use of abx. Her dizziness is mostly occurring when leaning forward now. On EMR review, 2012 carotid doppler studies with right 1-39% ICA stenosis. She has heard roaring in her ears for ears, prior to 2012 carotid.   Prior Note:  Pt presents for an acute OV with complaints of dizziness of 3 weeks duration. She endorses right ear pressure, cold sweats, swollen lower extremities (resolved), intermittent tingling of the left side of her face for the past week, dizziness upon standing and leaning forward, nauseated, loss of appetite, constipation. She reports one night she had extreme dizziness and had to lay on the bathroom floor, then he severe episode of diarrhea 1 and dizziness resolved. She denies dark or bloody stools. She endorses right flank pain that radiates to her lower abdomen over the last month, worsening over the last week. She endorses itchiness with urination, but denies burning or frequency of urination. She does not have a history of kidney stones. She has a history of diabetes with fasting blood sugars between 90-120. She has a history of GI bleed, but denies melena, hematochezia or color change in her bowel habits.  Allergies  Allergen Reactions  . Codeine Phosphate Nausea And Vomiting and Rash   Social History  Substance Use Topics  . Smoking status: Never Smoker    . Smokeless tobacco: Never Used  . Alcohol use No   Past Medical History:  Diagnosis Date  . Asthma   . 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 Diabetes mellitus    type 2  . DIABETES MELLITUS, TYPE II 11/09/2007  . Diverticulosis 03/2011  . Fatigue 07/22/2011  . History of rheumatoid arthritis    during 30's, was treated.  . Hyperlipidemia   . Hypertension   . Stress incontinence   . Visual disturbance of one eye 12/05/2011   Past Surgical History:  Procedure Laterality Date  . ABDOMINAL HYSTERECTOMY    . CARDIAC CATHETERIZATION     X 2, last one in 1998  . COLONOSCOPY    . COLONOSCOPY  May 2012   Dr. Olevia Perches: mild diverticulosis, otherwise normal.   . ESOPHAGOGASTRODUODENOSCOPY N/A 01/28/2015   Dr. Gala Romney: reflux esophagitis, Schatzki's ring not manipulated due to recent bleeding  . ESOPHAGOGASTRODUODENOSCOPY N/A 03/30/2015   Procedure: ESOPHAGOGASTRODUODENOSCOPY (EGD);  Surgeon: Daneil Dolin, MD;  [facility];  Service: Endoscopy;  Laterality: N/A;  1100  . MALONEY DILATION N/A 03/30/2015   Procedure: Venia Minks DILATION;  Surgeon: Daneil Dolin, MD;  [facility];  Service: Endoscopy;  Laterality: N/A;   Family History  Problem Relation Age of Onset  . Heart disease Mother   . Osteoarthritis Mother   . Sudden death Father   . Single kidney Father   . Other Father     h/o severe MVA injuries  . Hyperlipidemia Sister   . Other Daughter  Myalgias  . Fibromyalgia Daughter   . Allergies Daughter   . Heart disease Maternal Grandfather   . Sudden death Paternal Grandmother   . Diabetes Paternal Grandfather   . Heart disease Daughter   . Other Daughter     palpitations  . Pulmonary fibrosis Maternal Aunt   . Cancer Paternal Uncle   . Pulmonary fibrosis Maternal Aunt   . Colon cancer Neg Hx      Medication List       Accurate as of 06/13/16  1:59 PM. Always use your most recent med list.          albuterol 108 (90 Base) MCG/ACT  inhaler Commonly known as:  PROVENTIL HFA;VENTOLIN HFA Inhale 2 puffs into the lungs every 6 (six) hours as needed for wheezing.   allopurinol 100 MG tablet Commonly known as:  ZYLOPRIM Take 1 tablet (100 mg total) by mouth daily.   amLODipine 10 MG tablet Commonly known as:  NORVASC Take 1 tablet (10 mg total) by mouth daily.   amoxicillin-clavulanate 875-125 MG tablet Commonly known as:  AUGMENTIN Take 1 tablet by mouth 2 (two) times daily.   Fluoxetine HCl (PMDD) 20 MG Caps Take 1 capsule (20 mg total) by mouth daily.   fluticasone 50 MCG/ACT nasal spray Commonly known as:  FLONASE Place 2 sprays into both nostrils daily.   glucose blood test strip Commonly known as:  ONE TOUCH ULTRA TEST Use 1 test strip to test blood sugar levels 3 times daily   levothyroxine 50 MCG tablet Commonly known as:  SYNTHROID, LEVOTHROID Take 1 tablet (50 mcg total) by mouth daily before breakfast.   LORazepam 0.5 MG tablet Commonly known as:  ATIVAN For stress and insomnia   losartan 100 MG tablet Commonly known as:  COZAAR Take 1 tablet (100 mg total) by mouth daily.   ONE TOUCH ULTRA SYSTEM KIT w/Device Kit 1 kit by Does not apply route once.   ONETOUCH DELICA LANCETS FINE Misc Test twice daily.   pantoprazole 40 MG tablet Commonly known as:  PROTONIX Take 1 tablet (40 mg total) by mouth 2 (two) times daily before a meal.   pioglitazone 15 MG tablet Commonly known as:  ACTOS Take 1 tablet (15 mg total) by mouth daily. Take with largest meal of day.   rosuvastatin 40 MG tablet Commonly known as:  CRESTOR Take 1 tablet (40 mg total) by mouth daily.   sucralfate 1 g tablet Commonly known as:  CARAFATE Take 1 tablet (1 g total) by mouth 4 (four) times daily -  with meals and at bedtime.   traMADol 50 MG tablet Commonly known as:  ULTRAM Take 1 tablet (50 mg total) by mouth 2 (two) times daily as needed.   vitamin B-12 1000 MCG tablet Commonly known as:   CYANOCOBALAMIN Take 1,000 mcg by mouth daily.   Vitamin D (Cholecalciferol) 400 units Caps Take 400 Units by mouth daily.       No results found for this or any previous visit (from the past 24 hour(s)). No results found.   ROS: Negative, with the exception of above mentioned in HPI   Objective:  BP 135/83 (BP Location: Right Arm, Patient Position: Sitting, Cuff Size: Large)   Pulse 76   Temp 97.8 F (36.6 C) (Oral)   Resp 20   Wt 155 lb 12 oz (70.6 kg)   SpO2 98%   BMI 28.03 kg/m  Body mass index is 28.03 kg/m.  Orthostatics documented.  Gen:  Afebrile. No acute distress. Nontoxic in appearance, well developed, well nourished. Pleasant Caucasian female. HENT: AT. [place]. Bilateral TM visualized without erythema or bulging. MMM, no oral lesions. Bilateral nares mild erythema, no swelling. Throat without erythema or exudates. No cough on exam, no hoarseness on exam. Eyes:Pupils Equal Round Reactive to light, Extraocular movements intact,  Conjunctiva without redness, discharge or icterus. Neck/lymp/endocrine: Supple, no lymphadenopathy CV: RRR, no edema, carotid bruit right Chest: CTAB, no wheeze or crackles. Good air movement, normal resp effort.  Neuro: Normal gait.  PERLA. EOMi. Alert. Oriented x3.  Psych: Normal affect, dress and demeanor. Normal speech. Normal thought content and judgment.   Urinalysis    Component Value Date/Time   COLORURINE YELLOW 10/15/2015 1309   APPEARANCEUR CLEAR 10/15/2015 1309   LABSPEC 1.010 10/15/2015 1309   PHURINE 7.5 10/15/2015 1309   GLUCOSEU NEGATIVE 10/15/2015 1309   HGBUR NEGATIVE 10/15/2015 1309   HGBUR trace-intact 11/26/2010 1021   BILIRUBINUR neg 06/06/2016 0932   KETONESUR NEGATIVE 10/15/2015 1309   PROTEINUR trace 06/06/2016 0932   UROBILINOGEN 0.2 06/06/2016 0932   UROBILINOGEN 0.2 10/15/2015 1309   NITRITE neg 06/06/2016 0932   NITRITE NEGATIVE 10/15/2015 1309   LEUKOCYTESUR small (1+) (A) 06/06/2016 0932      Assessment/Plan: Elizabeth Mcintyre is a 75 y.o. female present for acute OV for  Dizziness:  - Urine studies collected were negative. - a1c- 5.4. Discontinued Actos, continue discontinuation with daily fasting glucose monitoring. Patient was encouraged that if she continuously sees fasting glucose above approximately 135 she is to start Actos every other day. - finish Augmentin. Hopefully dizziness is related to a sinus/your condition that will resolve. She is having some improvement. - She is to make certain to remain hydrated. - With carotid bruit identified today will order carotid Doppler ultrasound, last Doppler ultrasound was completed in 2012. - Follow-up in 3 months for diabetes recheck/chronic medical conditions  > 25 minutes spent with patient, >50% of time spent face to face counseling patient and coordinating care.   electronically signed by:  Howard Pouch, DO  [place]

## 2016-07-04 ENCOUNTER — Other Ambulatory Visit: Payer: Self-pay | Admitting: *Deleted

## 2016-07-04 DIAGNOSIS — E7849 Other hyperlipidemia: Secondary | ICD-10-CM

## 2016-07-04 MED ORDER — ROSUVASTATIN CALCIUM 40 MG PO TABS: 40.0000 mg | ORAL_TABLET | Freq: Every day | ORAL | 3 refills | Status: DC

## 2016-07-08 ENCOUNTER — Other Ambulatory Visit: Payer: Self-pay | Admitting: *Deleted

## 2016-07-08 DIAGNOSIS — R059 Cough, unspecified: Secondary | ICD-10-CM

## 2016-07-08 DIAGNOSIS — F329 Major depressive disorder, single episode, unspecified: Secondary | ICD-10-CM

## 2016-07-08 DIAGNOSIS — I10 Essential (primary) hypertension: Secondary | ICD-10-CM

## 2016-07-08 DIAGNOSIS — M109 Gout, unspecified: Secondary | ICD-10-CM

## 2016-07-08 DIAGNOSIS — F32A Depression, unspecified: Secondary | ICD-10-CM

## 2016-07-08 DIAGNOSIS — R05 Cough: Secondary | ICD-10-CM

## 2016-07-08 MED ORDER — ALLOPURINOL 100 MG PO TABS: 100.0000 mg | ORAL_TABLET | Freq: Every day | ORAL | 2 refills | Status: DC

## 2016-07-08 MED ORDER — AMLODIPINE BESYLATE 10 MG PO TABS: 10.0000 mg | ORAL_TABLET | Freq: Every day | ORAL | 2 refills | Status: DC

## 2016-07-08 MED ORDER — FLUOXETINE HCL (PMDD) 20 MG PO CAPS: 1.0000 | ORAL_CAPSULE | Freq: Every day | ORAL | 2 refills | Status: DC

## 2016-07-08 MED ORDER — ALBUTEROL SULFATE HFA 108 (90 BASE) MCG/ACT IN AERS: 2.0000 | INHALATION_SPRAY | Freq: Four times a day (QID) | RESPIRATORY_TRACT | 2 refills | Status: DC | PRN

## 2016-07-23 ENCOUNTER — Other Ambulatory Visit: Payer: Self-pay | Admitting: *Deleted

## 2016-07-23 DIAGNOSIS — I10 Essential (primary) hypertension: Secondary | ICD-10-CM

## 2016-07-23 DIAGNOSIS — M109 Gout, unspecified: Secondary | ICD-10-CM

## 2016-07-23 DIAGNOSIS — F32A Depression, unspecified: Secondary | ICD-10-CM

## 2016-07-23 DIAGNOSIS — F329 Major depressive disorder, single episode, unspecified: Secondary | ICD-10-CM

## 2016-07-23 MED ORDER — ALLOPURINOL 100 MG PO TABS: 100.0000 mg | ORAL_TABLET | Freq: Every day | ORAL | 0 refills | Status: DC

## 2016-07-23 MED ORDER — FLUOXETINE HCL (PMDD) 20 MG PO CAPS: 1.0000 | ORAL_CAPSULE | Freq: Every day | ORAL | 0 refills | Status: DC

## 2016-07-23 MED ORDER — AMLODIPINE BESYLATE 10 MG PO TABS: 10.0000 mg | ORAL_TABLET | Freq: Every day | ORAL | 0 refills | Status: DC

## 2016-07-23 NOTE — Telephone Encounter (Signed)
Patient requested 90 day scripts .new Rx sent for 90 day supply on,prozac,amlodipine, and allopurinol. Patient will need appt prior to any more refills.

## 2016-07-24 ENCOUNTER — Other Ambulatory Visit: Payer: Self-pay

## 2016-07-25 MED ORDER — PANTOPRAZOLE SODIUM 40 MG PO TBEC: 40.0000 mg | DELAYED_RELEASE_TABLET | Freq: Two times a day (BID) | ORAL | 3 refills | Status: DC

## 2016-08-12 ENCOUNTER — Other Ambulatory Visit: Payer: Self-pay

## 2016-08-12 MED ORDER — GLUCOSE BLOOD VI STRP
ORAL_STRIP | 12 refills | Status: DC
Start: 2016-08-12 — End: 2019-12-29

## 2016-08-27 ENCOUNTER — Other Ambulatory Visit: Payer: Self-pay | Admitting: *Deleted

## 2016-08-27 ENCOUNTER — Encounter: Payer: Self-pay | Admitting: *Deleted

## 2016-08-27 MED ORDER — LEVOTHYROXINE SODIUM 50 MCG PO TABS: 50.0000 ug | ORAL_TABLET | Freq: Every day | ORAL | 0 refills | Status: DC

## 2016-08-27 NOTE — Telephone Encounter (Signed)
Patient due for TSH and OV for hypothyroid. Refilled levothyroxine for 30 day supply. Sent message to patient in my chart.

## 2016-12-22 ENCOUNTER — Other Ambulatory Visit: Payer: Self-pay

## 2016-12-22 DIAGNOSIS — M109 Gout, unspecified: Secondary | ICD-10-CM

## 2016-12-22 MED ORDER — ALLOPURINOL 100 MG PO TABS: 100.0000 mg | ORAL_TABLET | Freq: Every day | ORAL | 0 refills | Status: DC

## 2016-12-24 ENCOUNTER — Other Ambulatory Visit: Payer: Self-pay

## 2016-12-24 DIAGNOSIS — I10 Essential (primary) hypertension: Secondary | ICD-10-CM

## 2016-12-24 MED ORDER — AMLODIPINE BESYLATE 10 MG PO TABS
10.0000 mg | ORAL_TABLET | Freq: Every day | ORAL | 0 refills | Status: DC
Start: 2016-12-24 — End: 2017-01-29

## 2016-12-24 NOTE — Telephone Encounter (Signed)
Refill sent.

## 2017-01-05 ENCOUNTER — Other Ambulatory Visit: Payer: Self-pay

## 2017-01-21 ENCOUNTER — Encounter: Payer: Self-pay | Admitting: *Deleted

## 2017-01-23 ENCOUNTER — Other Ambulatory Visit: Payer: Self-pay | Admitting: Family Medicine

## 2017-01-23 ENCOUNTER — Other Ambulatory Visit: Payer: Self-pay | Admitting: Gastroenterology

## 2017-01-23 DIAGNOSIS — E1129 Type 2 diabetes mellitus with other diabetic kidney complication: Secondary | ICD-10-CM

## 2017-01-23 NOTE — Telephone Encounter (Signed)
Please tell the patient I can send in a limited refill but we haven't seen her in almost 2 years and will need an updated OV for further refills.

## 2017-01-23 NOTE — Telephone Encounter (Signed)
Dr. Kuneff patient.  

## 2017-01-23 NOTE — Telephone Encounter (Signed)
Please schedule ov.  

## 2017-01-23 NOTE — Telephone Encounter (Signed)
Please tell the patient I can send in a limited refill but it has been about 2 years since we've seen her and she will need an OV for further refills.

## 2017-01-26 ENCOUNTER — Encounter: Payer: Self-pay | Admitting: Gastroenterology

## 2017-01-26 NOTE — Telephone Encounter (Signed)
APPT MADE AND LETTER SENT  °

## 2017-01-26 NOTE — Telephone Encounter (Signed)
Please schedule ov.  

## 2017-01-27 ENCOUNTER — Telehealth: Payer: Self-pay | Admitting: Family Medicine

## 2017-01-27 NOTE — Telephone Encounter (Signed)
Patient calling to request refills of the following:  prozac 20mg   pioglitazone (ACTOS) 15 MG tablet  losartan (COZAAR) 100 MG tablet  levothyroxine (SYNTHROID, LEVOTHROID) 50 MCG tablet  Pharmacy:  [id number] - [place], [place] [phone] (Phone) [phone] (Fax)

## 2017-01-28 NOTE — Telephone Encounter (Signed)
Left message for patient she needs office visit prior to anymore refills several messages have been sent through [facility] Chart prior to this .

## 2017-01-29 ENCOUNTER — Ambulatory Visit (INDEPENDENT_AMBULATORY_CARE_PROVIDER_SITE_OTHER): Payer: Medicare Other | Admitting: Family Medicine

## 2017-01-29 ENCOUNTER — Encounter: Payer: Self-pay | Admitting: Family Medicine

## 2017-01-29 VITALS — BP 146/81 | HR 76 | Temp 98.0°F | Resp 20 | Wt 155.8 lb

## 2017-01-29 DIAGNOSIS — F418 Other specified anxiety disorders: Secondary | ICD-10-CM

## 2017-01-29 DIAGNOSIS — E1122 Type 2 diabetes mellitus with diabetic chronic kidney disease: Secondary | ICD-10-CM

## 2017-01-29 DIAGNOSIS — R350 Frequency of micturition: Secondary | ICD-10-CM | POA: Diagnosis not present

## 2017-01-29 DIAGNOSIS — E038 Other specified hypothyroidism: Secondary | ICD-10-CM

## 2017-01-29 DIAGNOSIS — I6521 Occlusion and stenosis of right carotid artery: Secondary | ICD-10-CM

## 2017-01-29 DIAGNOSIS — E784 Other hyperlipidemia: Secondary | ICD-10-CM | POA: Diagnosis not present

## 2017-01-29 DIAGNOSIS — M109 Gout, unspecified: Secondary | ICD-10-CM

## 2017-01-29 DIAGNOSIS — I10 Essential (primary) hypertension: Secondary | ICD-10-CM

## 2017-01-29 DIAGNOSIS — R42 Dizziness and giddiness: Secondary | ICD-10-CM

## 2017-01-29 DIAGNOSIS — E782 Mixed hyperlipidemia: Secondary | ICD-10-CM

## 2017-01-29 DIAGNOSIS — N183 Chronic kidney disease, stage 3 (moderate): Secondary | ICD-10-CM

## 2017-01-29 DIAGNOSIS — E7849 Other hyperlipidemia: Secondary | ICD-10-CM

## 2017-01-29 DIAGNOSIS — E1169 Type 2 diabetes mellitus with other specified complication: Secondary | ICD-10-CM | POA: Insufficient documentation

## 2017-01-29 DIAGNOSIS — E118 Type 2 diabetes mellitus with unspecified complications: Secondary | ICD-10-CM

## 2017-01-29 LAB — LIPID PANEL
CHOL/HDL RATIO: 4
CHOLESTEROL: 187 mg/dL (ref 0–200)
HDL: 53 mg/dL (ref >39.00)
NonHDL: 133.81
TRIGLYCERIDES: 345 mg/dL — AB (ref 0.0–149.0)
VLDL: 69 mg/dL — AB (ref 0.0–40.0)

## 2017-01-29 LAB — BASIC METABOLIC PANEL WITH GFR
BUN: 20 mg/dL (ref 7–25)
CALCIUM: 9.7 mg/dL (ref 8.6–10.4)
CO2: 24 mmol/L (ref 20–31)
Chloride: 103 mmol/L (ref 98–110)
Creat: 1.45 mg/dL — ABNORMAL HIGH (ref 0.60–0.93)
GFR, EST AFRICAN AMERICAN: 41 mL/min — AB (ref >=60)
GFR, EST NON AFRICAN AMERICAN: 36 mL/min — AB (ref >=60)
GLUCOSE: 102 mg/dL — AB (ref 65–99)
Potassium: 4.7 mmol/L (ref 3.5–5.3)
Sodium: 140 mmol/L (ref 135–146)

## 2017-01-29 LAB — POCT GLYCOSYLATED HEMOGLOBIN (HGB A1C): Hemoglobin A1C: 5.6

## 2017-01-29 LAB — LDL CHOLESTEROL, DIRECT: Direct LDL: 76 mg/dL

## 2017-01-29 MED ORDER — LEVOTHYROXINE SODIUM 50 MCG PO TABS: 50.0000 ug | ORAL_TABLET | Freq: Every day | ORAL | 1 refills | Status: DC

## 2017-01-29 MED ORDER — AMLODIPINE BESYLATE 10 MG PO TABS: 10.0000 mg | ORAL_TABLET | Freq: Every day | ORAL | 1 refills | Status: DC

## 2017-01-29 MED ORDER — ALLOPURINOL 100 MG PO TABS: 100.0000 mg | ORAL_TABLET | Freq: Every day | ORAL | 1 refills | Status: DC

## 2017-01-29 MED ORDER — LOSARTAN POTASSIUM 50 MG PO TABS: 50.0000 mg | ORAL_TABLET | Freq: Every day | ORAL | 1 refills | Status: DC

## 2017-01-29 MED ORDER — ROSUVASTATIN CALCIUM 40 MG PO TABS: 40.0000 mg | ORAL_TABLET | Freq: Every day | ORAL | 3 refills | Status: DC

## 2017-01-29 MED ORDER — FLUOXETINE HCL (PMDD) 20 MG PO CAPS: 1.0000 | ORAL_CAPSULE | Freq: Every day | ORAL | 1 refills | Status: DC

## 2017-01-29 MED ORDER — LORAZEPAM 0.5 MG PO TABS: ORAL_TABLET | ORAL | 0 refills | Status: DC

## 2017-01-29 NOTE — Telephone Encounter (Signed)
Patient scheduled appt for today.

## 2017-01-29 NOTE — Patient Instructions (Addendum)
Stop actos--> we will call you to start a lower dose different med for Diabetes after we get your labs back  Refilled other meds.    You  Will need to follow up in 3 months for Diabetes, hypertension and have labs at that time to check your thyroid after starting medicine back.      Please help [place] help you:  We are honored you have chosen [facility] for your Primary Care home. Below you will find basic instructions that you may need to access in the future. Please help [place] help you by reading the instructions, which cover many of the frequent questions we experience.   Prescription refills and request:  -In order to allow more efficient response time, please call your pharmacy for all refills. They will forward the request electronically to [place]. This allows for the quickest possible response. Request left on a nurse line can take longer to refill, since these are checked as time allows between office patients and other phone calls.  - refill request can take up to 3-5 working days to complete.  - If request is sent electronically and request is appropiate, it is usually completed in 1-2 business days.  - all patients will need to be seen routinely for all chronic medical conditions requiring prescription medications (see follow-up below). If you are overdue for follow up on your condition, you will be asked to make an appointment and we will call in enough medication to cover you until your appointment (up to 30 days).  - all controlled substances will require a face to face visit to request/refill.  - if you desire your prescriptions to go through a new pharmacy, and have an active script at original pharmacy, you will need to call your pharmacy and have scripts transferred to new pharmacy. This is completed between the pharmacy locations and not by your provider.    Results: If any images or labs were ordered, it can take up to 1 week to get results depending on the test ordered and the  lab/facility running and resulting the test. - Normal or stable results, which do not need further discussion, may be released to your mychart immediately with attached note to you. A call may not be generated for normal results. Please make certain to sign up for mychart. If you have questions on how to activate your mychart you can call the front office.  - If your results need further discussion, our office will attempt to contact you via phone, and if unable to reach you after 2 attempts, we will release your abnormal result to your mychart with instructions.  - All results will be automatically released in mychart after 1 week.  - Your provider will provide you with explanation and instruction on all relevant material in your results. Please keep in mind, results and labs may appear confusing or abnormal to the untrained eye, but it does not mean they are actually abnormal for you personally. If you have any questions about your results that are not covered, or you desire more detailed explanation than what was provided, you should make an appointment with your provider to do so.   Our office handles many outgoing and incoming calls daily. If we have not contacted you within 1 week about your results, please check your mychart to see if there is a message first and if not, then contact our office.  In helping with this matter, you help decrease call volume, and therefore allow  [place] to be able to respond to patients needs more efficiently.   Acute office visits (sick visit):  An acute visit is intended for a new problem and are scheduled in shorter time slots to allow schedule openings for patients with new problems. This is the appropriate visit to discuss a new problem. In order to provide you with excellent quality medical care with proper time for you to explain your problem, have an exam and receive treatment with instructions, these appointments should be limited to one new problem per visit. If you  experience a new problem, in which you desire to be addressed, please make an acute office visit, we save openings on the schedule to accommodate you. Please do not save your new problem for any other type of visit, let us take care of it properly and quickly for you.   Follow up visits:  Depending on your condition(s) your provider will need to see you routinely in order to provide you with quality care and prescribe medication(s). Most chronic conditions (Example: hypertension, Diabetes, depression/anxiety... etc), require visits a couple times a year. Your provider will instruct you on proper follow up for your personal medical conditions and history. Please make certain to make follow up appointments for your condition as instructed. Failing to do so could result in lapse in your medication treatment/refills. If you request a refill, and are overdue to be seen on a condition, we will always provide you with a 30 day script (once) to allow you time to schedule.    Medicare wellness (well visit): - we have a wonderful Nurse Maudie Mercury), that will meet with you and provide you will yearly medicare wellness visits. These visits should occur yearly (can not be scheduled less than 1 calendar year apart) and cover preventive health, immunizations, advance directives and screenings you are entitled to yearly through your medicare benefits. Do not miss out on your entitled benefits, this is when medicare will pay for these benefits to be ordered for you.  These are strongly encouraged by your provider and is the appropriate type of visit to make certain you are up to date with all preventive health benefits. If you have not had your medicare wellness exam in the last 12 months, please make certain to schedule one by calling the office and schedule your medicare wellness with Maudie Mercury as soon as possible.   Yearly physical (well visit):  - Adults are recommended to be seen yearly for physicals. Check with your insurance  and date of your last physical, most insurances require one calendar year between physicals. Physicals include all preventive health topics, screenings, medical exam and labs that are appropriate for gender/age and history. You may have fasting labs needed at this visit. This is a well visit (not a sick visit), new problems should not be covered during this visit (see acute visit).  - Pediatric patients are seen more frequently when they are younger. Your provider will advise you on well child visit timing that is appropriate for your their age. - This is not a medicare wellness visit. Medicare wellness exams do not have an exam portion to the visit. Some medicare companies allow for a physical, some do not allow a yearly physical. If your medicare allows a yearly physical you can schedule the medicare wellness with our nurse Maudie Mercury and have your physical with your provider after, on the same day. Please check with insurance for your full benefits.   Late Policy/No Shows:  - all new patients  should arrive 15-30 minutes earlier than appointment to allow [place] time  to  obtain all personal demographics,  insurance information and for you to complete office paperwork. - All established patients should arrive 10-15 minutes earlier than appointment time to update all information and be checked in .  - In our best efforts to run on time, if you are late for your appointment you will be asked to either reschedule or if able, we will work you back into the schedule. There will be a wait time to work you back in the schedule,  depending on availability.  - If you are unable to make it to your appointment as scheduled, please call 24 hours ahead of time to allow [place] to fill the time slot with someone else who needs to be seen. If you do not cancel your appointment ahead of time, you may be charged a no show fee.

## 2017-01-29 NOTE — Progress Notes (Signed)
Elizabeth Mcintyre , Sep 14, 1941, 76 y.o., female MRN: [id number] Patient Care Team    Relationship Specialty Notifications Start End  Ma Hillock, DO PCP - General Family Medicine  05/25/15   Danie Binder, MD Consulting Physician Gastroenterology  01/29/15   Daneil Dolin, MD Consulting Physician Gastroenterology  05/14/15     Chief Complaint  Patient presents with  . Hypertension  . Hypothyroidism  . Diabetes     Subjective:  Elizabeth Mcintyre is a 76 y.o. female present for overdue chronic medical conditions appt.  Type 2 diabetes mellitus with complication, without long-term current use of insulin (HCC)/CKD3 Pt reports compliance with Actos 15 mg, taking approximately every other day or less. Denies numbness, tingling of extremities, hypo/hyperglycemic events or non-healing wounds. She admits she is still having occasional dizziness. She does not monitor her blood sugars. PNA series: Pneumovax 23 completed 10/27/2014. Prevnar due. Flu shot: Up-to-date 2017 (recommneded yearly) BMP: 06/06/2016 41 GFR, Cr 1.32 Foot exam: Overdue. Eye exam: Overdue. Last 2014 at [facility] care. A1c: 5.4 --> 5.6 today  hypothyroidism Patient reports not taking her medication last month. She states she ran out of medications. Last TSH December 2016 1.76. Patient is prescribed levothyroxine 50 g daily.  Depression with anxiety Patient reports compliance with Prilosec 20 mg daily. She states she likes to stay in house and be by herself, other than that she feels like she is doing quite well on this medication. She feels it controls her depression and anxiety quite well. She reports she rarely takes Ativan, but does around this time of the year. This is the timing the year that her husband had passed away and she has a little bit more sadness and anxiousness during these times.   hyperlipidemia Patient is overdue for lipid panel. She reports compliance with Crestor 40 mg daily. She  denies any knee negative side effects to medications.  Essential hypertension, benign/CKD3/hyperlipidemia/carotid artery stenosis Pt reports compliance with amlodipine 10 mg daily. She takes the losartan when necessary, averaging approximately every other day. Blood pressures ranges at home 107-131/59-84, lower ones being when she does take the losartan 100 mg daily. Patient denies chest pain, worsening shortness of breath or lower extremity edema. Pt is  prescribed statin. Patient does not take a baby aspirin secondary to GI upset, GERD, upper GI bleed. BMP: 06/06/2016, GFR 41 CBC: 06/06/2016 within normal limits Diet: Tries low-sodium Exercise: Tries to walk RF: Diabetes, hyperlipidemia, hypertension, family history of heart disease, Last carotid doppler 2012- Minimal plaque right bulb, normal carotid system, 0-39% bilateral. Repeat ordered 05/2016 when pt complained of dizziness and she did not schedule.   Gout  Patient reports compliance with allopurinol 100 mg daily. She has not had another gout attack within the last year.  Urinary frequency Patient feels she's had more urinary frequency. Discussed with her I will run a urine, but if the urine is normal and she continues to have urinary frequency she will need to be seen for this on a separate office. There is just not enough time to cover all of her issues today.   Depression screen [facility] 2/9 01/29/2017 05/25/2015  Decreased Interest 0 1  Down, Depressed, Hopeless 0 1  PHQ - 2 Score 0 2  Altered sleeping - 2  Tired, decreased energy - 3  Change in appetite - 3  Feeling bad or failure about yourself  - 0  Trouble concentrating - 0  Moving slowly or fidgety/restless -  0  Suicidal thoughts - 0  PHQ-9 Score - 10  Difficult doing work/chores - Somewhat difficult    Allergies  Allergen Reactions  . Codeine Phosphate Nausea And Vomiting and Rash   Social History  Substance Use Topics  . Smoking status: Never Smoker  . Smokeless  tobacco: Never Used  . Alcohol use No   Past Medical History:  Diagnosis Date  . Asthma   . 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 Diabetes mellitus    type 2  . DIABETES MELLITUS, TYPE II 11/09/2007  . Diverticulosis 03/2011  . Fatigue 07/22/2011  . History of rheumatoid arthritis    during 30's, was treated.  . Hyperlipidemia   . Hypertension   . Stress incontinence   . Visual disturbance of one eye 12/05/2011   Past Surgical History:  Procedure Laterality Date  . ABDOMINAL HYSTERECTOMY    . CARDIAC CATHETERIZATION     X 2, last one in 1998  . COLONOSCOPY    . COLONOSCOPY  May 2012   Dr. Olevia Perches: mild diverticulosis, otherwise normal.   . ESOPHAGOGASTRODUODENOSCOPY N/A 01/28/2015   Dr. Gala Romney: reflux esophagitis, Schatzki's ring not manipulated due to recent bleeding  . ESOPHAGOGASTRODUODENOSCOPY N/A 03/30/2015   Procedure: ESOPHAGOGASTRODUODENOSCOPY (EGD);  Surgeon: Daneil Dolin, MD;  Location: [facility];  Service: Endoscopy;  Laterality: N/A;  1100  . MALONEY DILATION N/A 03/30/2015   Procedure: Venia Minks DILATION;  Surgeon: Daneil Dolin, MD;  Location: [facility] SUITE;  Service: Endoscopy;  Laterality: N/A;   Family History  Problem Relation Age of Onset  . Heart disease Mother   . Osteoarthritis Mother   . Sudden death Father   . Single kidney Father   . Other Father        h/o severe MVA injuries  . Hyperlipidemia Sister   . Other Daughter        Myalgias  . Fibromyalgia Daughter   . Allergies Daughter   . Heart disease Maternal Grandfather   . Sudden death Paternal Grandmother   . Diabetes Paternal Grandfather   . Heart disease Daughter   . Other Daughter        palpitations  . Pulmonary fibrosis Maternal Aunt   . Cancer Paternal Uncle   . Pulmonary fibrosis Maternal Aunt   . Colon cancer Neg Hx    Allergies as of 01/29/2017      Reactions   Codeine Phosphate Nausea And Vomiting, Rash      Medication List       Accurate as of 01/29/17 11:56 AM. Always  use your most recent med list.          albuterol 108 (90 Base) MCG/ACT inhaler Commonly known as:  PROVENTIL HFA;VENTOLIN HFA Inhale 2 puffs into the lungs every 6 (six) hours as needed for wheezing.   allopurinol 100 MG tablet Commonly known as:  ZYLOPRIM Take 1 tablet (100 mg total) by mouth daily. Needs office visit for further refills.   amLODipine 10 MG tablet Commonly known as:  NORVASC Take 1 tablet (10 mg total) by mouth daily.   Fluoxetine HCl (PMDD) 20 MG Caps Take 1 capsule (20 mg total) by mouth daily.   fluticasone 50 MCG/ACT nasal spray Commonly known as:  FLONASE Place 2 sprays into both nostrils daily.   glucose blood test strip Commonly known as:  ONE TOUCH ULTRA TEST Use 1 test strip to test blood sugar levels 3 times daily   glucose blood test strip Commonly known  as:  ONE TOUCH ULTRA TEST Use 1 test strip to test blood sugar levels 3 times daily. Dx. E11.9   levothyroxine 50 MCG tablet Commonly known as:  SYNTHROID, LEVOTHROID Take 1 tablet (50 mcg total) by mouth daily before breakfast. Patient needs office visit prior to anymore refills.   LORazepam 0.5 MG tablet Commonly known as:  ATIVAN For stress and insomnia   losartan 100 MG tablet Commonly known as:  COZAAR Take 1 tablet (100 mg total) by mouth daily.   ONE TOUCH ULTRA SYSTEM KIT w/Device Kit 1 kit by Does not apply route once.   ONETOUCH DELICA LANCETS FINE Misc Test twice daily.   pantoprazole 40 MG tablet Commonly known as:  PROTONIX TAKE 1 TABLET BY MOUTH TWO TIMES DAILY   pioglitazone 15 MG tablet Commonly known as:  ACTOS Take 1 tablet (15 mg total) by mouth daily. Take with largest meal of day.   rosuvastatin 40 MG tablet Commonly known as:  CRESTOR Take 1 tablet (40 mg total) by mouth daily.   traMADol 50 MG tablet Commonly known as:  ULTRAM Take 1 tablet (50 mg total) by mouth 2 (two) times daily as needed.   vitamin B-12 1000 MCG tablet Commonly known as:   CYANOCOBALAMIN Take 1,000 mcg by mouth daily.   Vitamin D (Cholecalciferol) 400 units Caps Take 400 Units by mouth daily.       All past medical history, surgical history, allergies, family history, immunizations andmedications were updated in the EMR today and reviewed under the history and medication portions of their EMR.     ROS: Negative, with the exception of above mentioned in HPI  Objective:  BP (!) 146/81 (BP Location: Right Arm, Patient Position: Sitting, Cuff Size: Normal)   Pulse 76   Temp 98 F (36.7 C)   Resp 20   Wt 155 lb 12 oz (70.6 kg)   SpO2 98%   BMI 28.03 kg/m  Body mass index is 28.03 kg/m. Gen: Afebrile. No acute distress. Nontoxic in appearance, well developed, well nourished.  HENT: AT. [place].  MMM, no oral lesions. Eyes:Pupils Equal Round Reactive to light, Extraocular movements intact,  Conjunctiva without redness, discharge or icterus. Neck/lymp/endocrine: Supple,no lymphadenopathy CV: RRR no murmur appreciated, no edema, +2/4 PT bilaterally Chest: CTAB, no wheeze or crackles. Good air movement, normal resp effort.  Abd: Soft. . NTND. BS present.  Skin: No rashes, purpura or petechiae. Skin intact. Neuro:  Normal gait. PERLA. EOMi. Alert. Oriented x3  Psych: Normal affect, dress and demeanor. Normal speech. Normal thought content and judgment.  No exam data present No results found. Results for orders placed or performed in visit on 01/29/17 (from the past 24 hour(s))  POCT glycosylated hemoglobin (Hb A1C)     Status: Normal   Collection Time: 01/29/17 11:38 AM  Result Value Ref Range   Hemoglobin A1C 5.6     Assessment/Plan: Elizabeth Mcintyre is a 76 y.o. female present for OV for  Type 2 diabetes mellitus with complication, without long-term current use of insulin ([place]) - Discussed with patient she must follow-up every 3-4 months for her chronic medical conditions including her diabetes. - Discontinue Actos. It sounds as if she's having  hypoglycemic events and she is taking Actos approximately every other day with an A1c of 5.6 today. I suspect she is having some hypoglycemia. - After BMP is resulted, we'll make decision on further management of her diabetes. We'll likely start renally dosed Januvia, Onglyza or Tradjenta. Trying  to avoid hypoglycemic events. She cannot tolerate metformin secondary to low GFR. - Goal A1c 6.0-6.5 if on medication PNA series: Pneumovax 23 completed 10/27/2014. Prevnar due. Flu shot: Up-to-date 2017 (recommneded yearly) BMP: 06/06/2016 41 GFR, Cr 1.32--> repeated today Foot exam: Completed today Eye exam: Overdue. Last 2014 at [facility] care. A1c: 5.4 --> 5.6 today - POCT glycosylated hemoglobin (Hb M5Y) - BASIC METABOLIC PANEL WITH GFR - Follow-up in 3 months on new medication, if still complaining of dizziness or A1c below 6 would consider trying without medication with close follow-up.  hypothyroidism Restart Synthroid 50 g today. There is no sense in testing thyroid function today given that she's been without medication for 1 month. Will monitor TSH on next diabetic appointment in 3 months. - levothyroxine (SYNTHROID, LEVOTHROID) 50 MCG tablet; Take 1 tablet (50 mcg total) by mouth daily before breakfast. Patient needs office visit prior to anymore refills.  Dispense: 90 tablet; Refill: 1  CKD stage 3 secondary to diabetes (HCC) - Renally dose all medications. - No NSAIDs. - Consistent control of blood pressure and diabetes. - PTH/vitamin D/calcium every 6 months - Follow-up every 3-6 months with BMP  Depression with anxiety Well-controlled. Will refill Ativan today. She only uses a few pills around this time of the year on the anniversary of her husband's death. Narcotic database printed and reviewed in a part of the electronic medical record. - Fluoxetine HCl, PMDD, 20 MG CAPS; Take 1 capsule (20 mg total) by mouth daily.  Dispense: 90 each; Refill: 1 - LORazepam (ATIVAN)  0.5 MG tablet; For stress and insomnia  Dispense: 10 tablet; Refill: 0 - PHQ9 every 6 months. - F/U every 6 mos.  hyperlipidemia - Lipids collected today, patient is fasting. Continue Crestor 40 mg daily, refills provided today. - rosuvastatin (CRESTOR) 40 MG tablet; Take 1 tablet (40 mg total) by mouth daily.  Dispense: 90 tablet; Refill: 3 - Lipid panel - Follow yearly  Essential hypertension, benign Carotid artery stenosis history - Mildly elevated BP in the office today. Home readings are borderline to low normal depending upon if she took the losartan 100 mg or not that day. - Patient needs a consistent regimen to ensure compliance. Continue amlodipine 10 mg daily, start losartan 50 mg daily. - Discontinue losartan 100 mg, which she was taking intermittently - Dietary modifications and exercise encouraged. - if dizziness continues after med change for DM, would advise pt to have doppler studies that were ordered for her last September.  - Follow-up 3-4 months  Gout of big toe - Controlled, allopurinol renally dosed. - allopurinol (ZYLOPRIM) 100 MG tablet; Take 1 tablet (100 mg total) by mouth daily. Needs office visit for further refills.  Dispense: 90 tablet; Refill: 1  Urinary frequency - Briefly discussed today secondary to time. There is just not enough time to cover all of her issues in one visit. I will agree to run a urine today if urine is normal, and patient continues to have symptoms she will need to follow-up for this issue. - Urinalysis, Routine w reflex microscopic  Reviewed expectations re: course of current medical issues.  Discussed self-management of symptoms.  Outlined signs and symptoms indicating need for more acute intervention.  Patient verbalized understanding and all questions were answered.  Patient received an After-Visit Summary.   Greater than 40 minutes spent with patient, >50% of time spent face to face counseling and/or coordinating care.    Note is dictated utilizing voice recognition software. Although note  has been proof read prior to signing, occasional typographical errors still can be missed. If any questions arise, please do not hesitate to call for verification.   electronically signed by:  Howard Pouch, DO  [place]

## 2017-01-30 ENCOUNTER — Encounter: Payer: Self-pay | Admitting: Family Medicine

## 2017-01-30 ENCOUNTER — Telehealth: Payer: Self-pay | Admitting: Family Medicine

## 2017-01-30 DIAGNOSIS — N183 Chronic kidney disease, stage 3 unspecified: Secondary | ICD-10-CM

## 2017-01-30 DIAGNOSIS — E1122 Type 2 diabetes mellitus with diabetic chronic kidney disease: Secondary | ICD-10-CM

## 2017-01-30 LAB — URINALYSIS, ROUTINE W REFLEX MICROSCOPIC
BILIRUBIN URINE: NEGATIVE
GLUCOSE, UA: NEGATIVE
HGB URINE DIPSTICK: NEGATIVE
KETONES UR: NEGATIVE
Leukocytes, UA: NEGATIVE
Nitrite: NEGATIVE
PROTEIN: NEGATIVE
Specific Gravity, Urine: 1.017 (ref 1.001–1.035)
pH: 7 (ref 5.0–8.0)

## 2017-01-30 MED ORDER — SITAGLIPTIN PHOSPHATE 25 MG PO TABS: 25.0000 mg | ORAL_TABLET | Freq: Every day | ORAL | 1 refills | Status: DC

## 2017-01-30 NOTE — Telephone Encounter (Signed)
Left message for patient to return call.

## 2017-01-30 NOTE — Telephone Encounter (Signed)
Please call pt: - Her labs indicate a mild decrease in her kidney function from prior. This means we have to make certain to keep adequate control of her DM and HTN so it does not worsen by following every 3 months for these conditions with labs. - I do want her stop the actos. I have called in a medication for her to start instead daily. Her insurance coverage will decide on what med we decide on. - her urine was normal.  - Her cholesterol looked good, but the triglycerides were high. Please have her start fish oil 3000 mg daily. This can help lower triglycerides, along with cutting back on fried foods, sugar and increase exercise. F/u 3 months

## 2017-01-30 NOTE — Telephone Encounter (Signed)
Spoke with patient reviewed lab results and instructions . Patient verbalized understanding of all instructions.

## 2017-02-04 ENCOUNTER — Telehealth: Payer: Self-pay | Admitting: Family Medicine

## 2017-02-04 NOTE — Telephone Encounter (Signed)
Patient called stating that her new RX that was sent into the pharmacy was over $300.  Is there another alternative.  Please contact patient.

## 2017-02-05 NOTE — Telephone Encounter (Signed)
Prior authorization not required per insurance company. Note sent to scan.

## 2017-02-19 ENCOUNTER — Ambulatory Visit (INDEPENDENT_AMBULATORY_CARE_PROVIDER_SITE_OTHER): Payer: Medicare Other | Admitting: Gastroenterology

## 2017-02-19 ENCOUNTER — Encounter: Payer: Self-pay | Admitting: Gastroenterology

## 2017-02-19 DIAGNOSIS — R1011 Right upper quadrant pain: Secondary | ICD-10-CM

## 2017-02-19 DIAGNOSIS — R109 Unspecified abdominal pain: Secondary | ICD-10-CM | POA: Insufficient documentation

## 2017-02-19 LAB — HEPATIC FUNCTION PANEL
ALBUMIN: 4.3 g/dL (ref 3.6–5.1)
ALT: 16 U/L (ref 6–29)
AST: 21 U/L (ref 10–35)
Alkaline Phosphatase: 69 U/L (ref 33–130)
BILIRUBIN DIRECT: 0.1 mg/dL (ref <=0.2)
Indirect Bilirubin: 0.4 mg/dL (ref 0.2–1.2)
TOTAL PROTEIN: 6.8 g/dL (ref 6.1–8.1)
Total Bilirubin: 0.5 mg/dL (ref 0.2–1.2)

## 2017-02-19 NOTE — Progress Notes (Signed)
CC'D TO PCP °

## 2017-02-19 NOTE — Patient Instructions (Signed)
Please have blood work done today.   We have ordered an ultrasound of your liver/gallbladder.   I will see you in 8 weeks. In the meantime, start taking Metamucil or Benefiber daily. We will see if we can't get better consistency with bowel movements.

## 2017-02-19 NOTE — Assessment & Plan Note (Signed)
76 year old female with chronic right-sided abdominal pain, located RUQ/RLQ/right lower back at times. Waxes and wanes in intensity, with no real precipitating factors. Notes some mild improvement at times after BM. Gallbladder remains in situ. Check HFP now, US abdomen, consider HIDA, add daily supplemental fiber in hopes of improvement with bowel function. Hold on CT, as she has no alarm symptoms. Colonoscopy due in 2022, and last EGD in 2016. No indication for endoscopy at this time. Return in 8 weeks.

## 2017-02-19 NOTE — Progress Notes (Signed)
Referring Provider: Ma Hillock, DO Primary Care Physician:  Ma Hillock, DO Primary GI: Dr. Gala Romney   Chief Complaint  Patient presents with  . Gastroesophageal Reflux    f/u    HPI:   Elizabeth Mcintyre is a 76 y.o. female presenting today with a history of reflux esophagitis, Schatzki's ring s/p dilation last in 2016. Due for colonoscopy in 2022. Here for refills.   No dysphagia. Has epigastric pain and RUQ pain. Has pain in back and radiates down RLQ. Constant pain.  Pain not worsened by movement. Not worsened by eating. Takes spells. Sometimes will be "fine", even though it is always dull. Occasional nausea once in awhile. Sometimes pain relieved after BM. Sometimes every day BM, sometimes skips a few days, sometimes several times a day. No rectal bleeding.   [place] in Feb 2016 that was normal. Weight is overall stable. Appetite isn't the best. Eats a lot at night because she doesn't sleep well. Tries to eat fruit in the morning.    Past Medical History:  Diagnosis Date  . Asthma   . 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 DIABETES MELLITUS, TYPE II 11/09/2007  . Diverticulosis 03/2011  . Fatigue 07/22/2011  . GI bleed   . History of rheumatoid arthritis    during 30's, was treated.  . Hyperlipidemia   . Hypertension   . Stress incontinence   . Visual disturbance of one eye 12/05/2011    Past Surgical History:  Procedure Laterality Date  . ABDOMINAL HYSTERECTOMY    . CARDIAC CATHETERIZATION     X 2, last one in 1998  . COLONOSCOPY    . COLONOSCOPY  May 2012   Dr. Olevia Perches: mild diverticulosis, otherwise normal.   . ESOPHAGOGASTRODUODENOSCOPY N/A 01/28/2015   Dr. Gala Romney: reflux esophagitis, Schatzki's ring not manipulated due to recent bleeding  . ESOPHAGOGASTRODUODENOSCOPY N/A 03/30/2015   Dr. Gala Romney: Schatzki's ring s/p Venia Minks dilation, previously noted esophageal ulcer completely healed  . MALONEY DILATION N/A 03/30/2015     Procedure: Venia Minks DILATION;  Surgeon: Daneil Dolin, MD;  Location: [facility];  Service: Endoscopy;  Laterality: N/A;    Current Outpatient Prescriptions  Medication Sig Dispense Refill  . albuterol (PROVENTIL HFA;VENTOLIN HFA) 108 (90 Base) MCG/ACT inhaler Inhale 2 puffs into the lungs every 6 (six) hours as needed for wheezing. 1 Inhaler 2  . allopurinol (ZYLOPRIM) 100 MG tablet Take 1 tablet (100 mg total) by mouth daily. Needs office visit for further refills. 90 tablet 1  . amLODipine (NORVASC) 10 MG tablet Take 1 tablet (10 mg total) by mouth daily. 90 tablet 1  . Blood Glucose Monitoring Suppl (ONE TOUCH ULTRA SYSTEM KIT) W/DEVICE KIT 1 kit by Does not apply route once. 1 each 0  . Fluoxetine HCl, PMDD, 20 MG CAPS Take 1 capsule (20 mg total) by mouth daily. 90 each 1  . glucose blood (ONE TOUCH ULTRA TEST) test strip Use 1 test strip to test blood sugar levels 3 times daily. Dx. E11.9 100 each 12  . levothyroxine (SYNTHROID, LEVOTHROID) 50 MCG tablet Take 1 tablet (50 mcg total) by mouth daily before breakfast. Patient needs office visit prior to anymore refills. 90 tablet 1  . losartan (COZAAR) 50 MG tablet Take 1 tablet (50 mg total) by mouth daily. 90 tablet 1  . Omega-3 Fatty Acids (FISH OIL) 1000 MG CAPS Take 1 capsule by mouth  daily. (pt thinks it may be '1000mg'$ )    . ONETOUCH DELICA LANCETS FINE MISC Test twice daily. 180 each 2  . pantoprazole (PROTONIX) 40 MG tablet Take 40 mg by mouth daily.    . rosuvastatin (CRESTOR) 40 MG tablet Take 1 tablet (40 mg total) by mouth daily. 90 tablet 3  . vitamin B-12 (CYANOCOBALAMIN) 1000 MCG tablet Take 1,000 mcg by mouth daily.    . Vitamin D, Cholecalciferol, 400 UNITS CAPS Take 400 Units by mouth daily.     No current facility-administered medications for this visit.     Allergies as of 02/19/2017 - Review Complete 02/19/2017  Allergen Reaction Noted  . Codeine phosphate Nausea And Vomiting and Rash     Family History   Problem Relation Age of Onset  . Heart disease Mother   . Osteoarthritis Mother   . Sudden death Father   . Single kidney Father   . Other Father        h/o severe MVA injuries  . Hyperlipidemia Sister   . Other Daughter        Myalgias  . Fibromyalgia Daughter   . Allergies Daughter   . Heart disease Maternal Grandfather   . Sudden death Paternal Grandmother   . Diabetes Paternal Grandfather   . Heart disease Daughter   . Other Daughter        palpitations  . Pulmonary fibrosis Maternal Aunt   . Cancer Paternal Uncle   . Pulmonary fibrosis Maternal Aunt   . Colon cancer Neg Hx     Social History   Social History  . Marital status: Widowed    Spouse name: N/A  . Number of children: 2  . Years of education: N/A   Occupational History  . retired    Social History Main Topics  . Smoking status: Never Smoker  . Smokeless tobacco: Never Used  . Alcohol use No  . Drug use: No  . Sexual activity: No   Other Topics Concern  . None   Social History Narrative   Elizabeth Mcintyre is widowed. Her young grandson lives with her, for whom she shares custody with her daughter.      Review of Systems: As mentioned in HPI   Physical Exam: BP 139/74   Pulse 82   Temp 97 F (36.1 C) (Oral)   Ht '5\' 3"'$  (1.6 m)   Wt 156 lb 6.4 oz (70.9 kg)   BMI 27.71 kg/m  General:   Alert and oriented. No distress noted. Pleasant and cooperative.  Head:  Normocephalic and atraumatic. Eyes:  Conjuctiva clear without scleral icterus. Mouth:  Oral mucosa pink and moist. Good dentition. No lesions. Heart:  S1, S2 present without murmurs, rubs, or gallops. Regular rate and rhythm. Abdomen:  +BS, soft, TTP RUQ and RLQ and non-distended. No rebound or guarding. No HSM or masses noted. Msk:  Symmetrical without gross deformities. Normal posture. Extremities:  Without edema. Neurologic:  Alert and  oriented x4;  grossly normal neurologically. Psych:  Alert and cooperative. Normal mood and  affect.

## 2017-02-23 ENCOUNTER — Ambulatory Visit (HOSPITAL_COMMUNITY)
Admission: RE | Admit: 2017-02-23 | Discharge: 2017-02-23 | Disposition: A | Discharge status: Home / Self Care | Payer: Medicare Other | Source: Ambulatory Visit | Attending: Gastroenterology | Admitting: Gastroenterology

## 2017-02-23 DIAGNOSIS — R1010 Upper abdominal pain, unspecified: Secondary | ICD-10-CM | POA: Diagnosis not present

## 2017-02-23 DIAGNOSIS — N281 Cyst of kidney, acquired: Secondary | ICD-10-CM | POA: Diagnosis not present

## 2017-02-23 DIAGNOSIS — K824 Cholesterolosis of gallbladder: Secondary | ICD-10-CM | POA: Insufficient documentation

## 2017-02-23 DIAGNOSIS — R1011 Right upper quadrant pain: Secondary | ICD-10-CM | POA: Diagnosis not present

## 2017-02-25 NOTE — Progress Notes (Signed)
HFP normal. US abdomen with small gallbladder polyp, no gallstones. She will need serial surveillance of this polyp unless she has a cholecystectomy. If still having RUQ pain, recommend HIDA.

## 2017-02-26 ENCOUNTER — Other Ambulatory Visit: Payer: Self-pay

## 2017-02-26 DIAGNOSIS — R1011 Right upper quadrant pain: Secondary | ICD-10-CM

## 2017-02-26 DIAGNOSIS — K824 Cholesterolosis of gallbladder: Secondary | ICD-10-CM

## 2017-03-04 ENCOUNTER — Ambulatory Visit (HOSPITAL_COMMUNITY)
Admission: RE | Admit: 2017-03-04 | Discharge: 2017-03-04 | Disposition: A | Discharge status: Home / Self Care | Payer: Medicare Other | Source: Ambulatory Visit | Attending: Gastroenterology | Admitting: Gastroenterology

## 2017-03-04 ENCOUNTER — Encounter (HOSPITAL_COMMUNITY): Payer: Self-pay

## 2017-03-04 DIAGNOSIS — R1011 Right upper quadrant pain: Secondary | ICD-10-CM | POA: Diagnosis not present

## 2017-03-04 DIAGNOSIS — K824 Cholesterolosis of gallbladder: Secondary | ICD-10-CM

## 2017-03-04 DIAGNOSIS — K8020 Calculus of gallbladder without cholecystitis without obstruction: Secondary | ICD-10-CM | POA: Diagnosis not present

## 2017-03-04 IMAGING — NM NM HEPATO W/GB/PHARM/[PERSON_NAME]
2 series · 12 of 12 positions shown · non-contrast
Comparison: Right upper quadrant ultrasound [DATE]

CLINICAL DATA: Right upper quadrant pain with nausea and vomiting
for 3 months. Gallbladder polyp on ultrasound.

EXAM:
NUCLEAR MEDICINE HEPATOBILIARY IMAGING WITH GALLBLADDER EF
TECHNIQUE: Sequential images of the abdomen were obtained [DATE] minutes
following intravenous administration of radiopharmaceutical. After
oral ingestion of Ensure, gallbladder ejection fraction was
determined. At 60 min, normal ejection fraction is greater than 33%.
RADIOPHARMACEUTICALS:  5.1 mCi [8I]  Choletec IV

[Series 1: biliary · 3.25mm/px · 6 of 60 frames shown]
[frame 6/60]
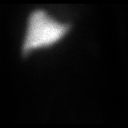
[frame 16/60]
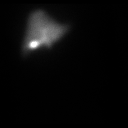
[frame 26/60]
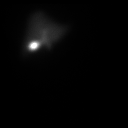
[frame 36/60]
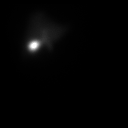
[frame 46/60]
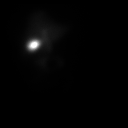
[frame 56/60]
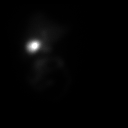

[Series 2: gbef · 3.25mm/px · 6 of 60 frames shown]
[frame 6/60]
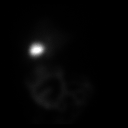
[frame 16/60]
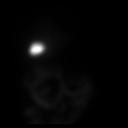
[frame 26/60]
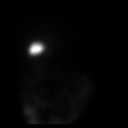
[frame 36/60]
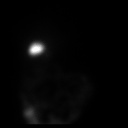
[frame 46/60]
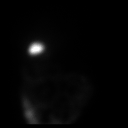
[frame 56/60]
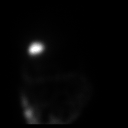

[12 of 12 positions shown; findings below may reference images not displayed]

FINDINGS: Prompt uptake and biliary excretion of activity by the liver is
seen. Gallbladder activity is visualized at approximately 7 minutes,
consistent with patency of cystic duct. Biliary activity passes into
small bowel, consistent with patent common bile duct.

Calculated gallbladder ejection fraction is 16%. (Normal gallbladder
ejection fraction with Ensure is greater than 33%.)
IMPRESSION: 1. Patent cystic and common bile ducts.
2. Diminished gallbladder ejection fraction suggestive of
gallbladder dysfunction.

## 2017-03-04 MED ORDER — TECHNETIUM TC 99M MEBROFENIN IV KIT
5.0000 | PACK | Freq: Once | INTRAVENOUS | Status: AC | PRN
  Administered 2017-03-04: 5.1 via INTRAVENOUS

## 2017-03-13 ENCOUNTER — Telehealth: Payer: Self-pay

## 2017-03-13 ENCOUNTER — Ambulatory Visit (INDEPENDENT_AMBULATORY_CARE_PROVIDER_SITE_OTHER): Payer: Medicare Other

## 2017-03-13 VITALS — BP 120/70 | HR 69 | Ht 63.0 in | Wt 155.4 lb

## 2017-03-13 DIAGNOSIS — Z0000 Encounter for general adult medical examination without abnormal findings: Secondary | ICD-10-CM | POA: Diagnosis not present

## 2017-03-13 DIAGNOSIS — E2839 Other primary ovarian failure: Secondary | ICD-10-CM | POA: Diagnosis not present

## 2017-03-13 DIAGNOSIS — Z23 Encounter for immunization: Secondary | ICD-10-CM | POA: Diagnosis not present

## 2017-03-13 DIAGNOSIS — Z1231 Encounter for screening mammogram for malignant neoplasm of breast: Secondary | ICD-10-CM | POA: Diagnosis not present

## 2017-03-13 DIAGNOSIS — Z1239 Encounter for other screening for malignant neoplasm of breast: Secondary | ICD-10-CM

## 2017-03-13 NOTE — Progress Notes (Addendum)
Subjective:   Elizabeth Mcintyre is a 76 y.o. female who presents for an Initial Medicare Annual Wellness Visit.  Review of Systems    No ROS.  Medicare Wellness Visit. Additional risk factors are reflected in the social history.   Cardiac Risk Factors include: advanced age (>19mn, >>63women);diabetes mellitus;dyslipidemia;hypertension;family history of premature cardiovascular disease   Sleep patterns: Sleeps 5 hours, feels rested. Up to void several times.  Home Safety/Smoke Alarms: Feels safe in home. Smoke alarms in place.  Living environment; residence and Firearm Safety: Friend and grandson lives with pt in 2 story home.  Seat Belt Safety/Bike Helmet: Wears seat belt.   Counseling:   Eye Exam-Last exam 11/08/2012. [facility] Will make appointment.  Dental-Last exam > 5 years. Only with issues. Currently no dental insurance, declines "D[facility]".   Female:   [id number]      Mammo-01/30/2016, negative.  Order placed.       Dexa scan-09/29/2013, Osteopenia.  Order placed.        [facility]-Colonoscopy 01/30/2011, normal. Recall 10 years.       Objective:    Today's Vitals   03/13/17 0905  BP: 120/70  Pulse: 69  SpO2: 98%  Weight: 155 lb 6.4 oz (70.5 kg)  Height: _0  (1.6 m)  PainSc: 2    Body mass index is 27.53 kg/m.   Current Medications (verified) Outpatient Encounter Prescriptions as of 03/13/2017  Medication Sig  . acetaminophen (TYLENOL) 325 MG tablet Take 650 mg by mouth every 6 (six) hours as needed.  .Marland Kitchenalbuterol (PROVENTIL HFA;VENTOLIN HFA) 108 (90 Base) MCG/ACT inhaler Inhale 2 puffs into the lungs every 6 (six) hours as needed for wheezing.  .Marland Kitchenallopurinol (ZYLOPRIM) 100 MG tablet Take 1 tablet (100 mg total) by mouth daily. Needs office visit for further refills.  .Marland KitchenamLODipine (NORVASC) 10 MG tablet Take 1 tablet (10 mg total) by mouth daily.  . Blood Glucose Monitoring Suppl (ONE TOUCH ULTRA SYSTEM KIT) W/DEVICE KIT 1 kit by Does not apply route  once.  . Fluoxetine HCl, PMDD, 20 MG CAPS Take 1 capsule (20 mg total) by mouth daily.  .Marland Kitchenglucose blood (ONE TOUCH ULTRA TEST) test strip Use 1 test strip to test blood sugar levels 3 times daily. Dx. E11.9  . levothyroxine (SYNTHROID, LEVOTHROID) 50 MCG tablet Take 1 tablet (50 mcg total) by mouth daily before breakfast. Patient needs office visit prior to anymore refills.  .Marland KitchenLORazepam (ATIVAN) 0.5 MG tablet as needed.  .Marland Kitchenlosartan (COZAAR) 50 MG tablet Take 1 tablet (50 mg total) by mouth daily.  . Omega-3 Fatty Acids (FISH OIL) 1000 MG CAPS Take 1 capsule by mouth daily. (pt thinks it may be 10066m  . ONETOUCH DELICA LANCETS FINE MISC Test twice daily.  . pantoprazole (PROTONIX) 40 MG tablet Take 40 mg by mouth daily.  . rosuvastatin (CRESTOR) 40 MG tablet Take 1 tablet (40 mg total) by mouth daily.  . vitamin B-12 (CYANOCOBALAMIN) 1000 MCG tablet Take 1,000 mcg by mouth daily.  . Vitamin D, Cholecalciferol, 400 UNITS CAPS Take 400 Units by mouth daily.  . Wheat Dextrin (BENEFIBER) POWD Take by mouth.   No facility-administered encounter medications on file as of 03/13/2017.     Allergies (verified) Codeine phosphate   History: Past Medical History:  Diagnosis Date  . Asthma   . 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 DIABETES MELLITUS, TYPE II 11/09/2007  .  Diverticulosis 03/2011  . Fatigue 07/22/2011  . GI bleed   . History of rheumatoid arthritis    during 30's, was treated.  . Hyperlipidemia   . Hypertension   . Stress incontinence   . Visual disturbance of one eye 12/05/2011   Past Surgical History:  Procedure Laterality Date  . ABDOMINAL HYSTERECTOMY    . CARDIAC CATHETERIZATION     X 2, last one in 1998  . COLONOSCOPY    . COLONOSCOPY  May 2012   Dr. Olevia Perches: mild diverticulosis, otherwise normal.   . ESOPHAGOGASTRODUODENOSCOPY N/A 01/28/2015   Dr. Gala Romney: reflux esophagitis, Schatzki's ring not manipulated due to recent  bleeding  . ESOPHAGOGASTRODUODENOSCOPY N/A 03/30/2015   Dr. Gala Romney: Schatzki's ring s/p Venia Minks dilation, previously noted esophageal ulcer completely healed  . MALONEY DILATION N/A 03/30/2015   Procedure: Venia Minks DILATION;  Surgeon: Daneil Dolin, MD;  Location: [facility];  Service: Endoscopy;  Laterality: N/A;   Family History  Problem Relation Age of Onset  . Heart disease Mother   . Osteoarthritis Mother   . Sudden death Father   . Single kidney Father   . Other Father        h/o severe MVA injuries  . Hyperlipidemia Sister   . Other Daughter        Myalgias  . Fibromyalgia Daughter   . Allergies Daughter   . Heart disease Maternal Grandfather   . Sudden death Paternal Grandmother   . Diabetes Paternal Grandfather   . Heart disease Daughter   . Other Daughter        palpitations  . Pulmonary fibrosis Maternal Aunt   . Cancer Paternal Uncle   . Pulmonary fibrosis Maternal Aunt   . Colon cancer Neg Hx    Social History   Occupational History  . retired    Social History Main Topics  . Smoking status: Never Smoker  . Smokeless tobacco: Never Used  . Alcohol use No  . Drug use: No  . Sexual activity: No    Tobacco Counseling Counseling given: No   Activities of Daily Living In your present state of health, do you have any difficulty performing the following activities: 03/13/2017  Hearing? N  Vision? N  Difficulty concentrating or making decisions? N  Walking or climbing stairs? Y  Dressing or bathing? N  Doing errands, shopping? N  Preparing Food and eating ? N  Using the Toilet? N  In the past six months, have you accidently leaked urine? N  Do you have problems with loss of bowel control? N  Managing your Medications? N  Managing your Finances? N  Housekeeping or managing your Housekeeping? N  Some recent data might be hidden    Immunizations and Health Maintenance Immunization History  Administered Date(s) Administered  . Influenza Split  07/16/2011  . Influenza,inj,Quad PF,36+ Mos 06/09/2014, 10/15/2015  . Pneumococcal Polysaccharide-23 10/27/2014  . Tdap 07/16/2011   Health Maintenance Due  Topic Date Due  . OPHTHALMOLOGY EXAM  11/08/2013  . PNA vac Low Risk Adult (2 of 2 - PCV13) 10/28/2015  . DEXA SCAN  09/29/2016  PCV13 administered today.   Patient Care Team: Ma Hillock, DO as PCP - General (Family Medicine) Danie Binder, MD as Consulting Physician (Gastroenterology) Gala Romney Cristopher Estimable, MD as Consulting Physician (Gastroenterology) Minus Breeding, MD as Consulting Physician (Cardiology) Annitta Needs, NP (Gastroenterology)  Indicate any recent Medical Services you may have received from other than [facility] providers in the past year (  date may be approximate).     Assessment:   This is a routine wellness examination for Elizabeth Mcintyre. Physical assessment deferred to PCP.   Hearing/Vision screen Hearing Screening Comments: Able to hear conversational tones w/o difficulty. No issues reported.   Vision Screening Comments: Wears glasses.   Dietary issues and exercise activities discussed: Current Exercise Habits: Home exercise routine, Type of exercise: walking, Time (Minutes): 20, Frequency (Times/Week): 7, Weekly Exercise (Minutes/Week): 140, Exercise limited by: None identified   Diet (meal preparation, eat out, water intake, caffeinated beverages, dairy products, fruits and vegetables): Drinks water  Breakfast: fruit, egg, cheese Lunch: sandwich Dinner: vegetable, protein     Discussed heart healthy diet and options for increasing activity.  Goals    . Exercise 150 minutes per week (moderate activity)          Increase activity.       Depression Screen PHQ 2/9 Scores 03/13/2017 01/29/2017 05/25/2015  PHQ - 2 Score 0 0 2  PHQ- 9 Score - - 10    Fall Risk Fall Risk  03/13/2017 01/29/2017 05/25/2015  Falls in the past year? No No No    Cognitive Function: MMSE - Mini Mental State Exam 03/13/2017    Orientation to time 5  Orientation to Place 5  Registration 3  Attention/ Calculation 5  Recall 3  Language- name 2 objects 2  Language- repeat 1  Language- follow 3 step command 3  Language- read & follow direction 1  Write a sentence 1  Copy design 1  Total score 30        Screening Tests Health Maintenance  Topic Date Due  . OPHTHALMOLOGY EXAM  11/08/2013  . PNA vac Low Risk Adult (2 of 2 - PCV13) 10/28/2015  . DEXA SCAN  09/29/2016  . INFLUENZA VACCINE  04/15/2017  . HEMOGLOBIN A1C  08/01/2017  . MAMMOGRAM  01/28/2018  . FOOT EXAM  01/29/2018  . LIPID PANEL  01/29/2018  . COLONOSCOPY  01/29/2021  . TETANUS/TDAP  07/15/2021      Plan:    Schedule eye appointment.   Schedule Mammogram and Bone scan.   Continue doing brain stimulating activities (puzzles, reading, adult coloring books, staying active) to keep memory sharp.   Bring a copy of your advance directives to your next office visit.  Shingrix Vaccine  I have personally reviewed and noted the following in the patient's chart:   . Medical and social history . Use of alcohol, tobacco or illicit drugs  . Current medications and supplements . Functional ability and status . Nutritional status . Physical activity . Advanced directives . List of other physicians . Hospitalizations, surgeries, and ER visits in previous 12 months . Vitals . Screenings to include cognitive, depression, and falls . Referrals and appointments  In addition, I have reviewed and discussed with patient certain preventive protocols, quality metrics, and best practice recommendations. A written personalized care plan for preventive services as well as general preventive health recommendations were provided to patient.     Gerilyn Nestle, RN   03/13/2017   PCP Notes: -Pt will schedule eye exam -Mammo and DEXA ordered.  -Continues to have right flank pain/abdominal pain. Has f/u with GI scheduled.  -Pt never filled  Januvia prescription d/t cost-phone note sent   Medical screening examination/treatment/procedure(s) were performed by non-physician practitioner and as supervising physician I was immediately available for consultation/collaboration.  I agree with above assessment and plan.  Electronically Signed by: Howard Pouch, DO [facility]-  OR

## 2017-03-13 NOTE — Telephone Encounter (Signed)
Patient in for AWV with [facility] today. Patient would like PCP to know she never filled the prescription for Januvia d/t cost. Reports fasting blood sugar range of 110-140. Does not have f/u appt scheduled at this time.

## 2017-03-13 NOTE — Patient Instructions (Addendum)
Schedule eye appointment.   Schedule Mammogram and Bone scan.   Continue doing brain stimulating activities (puzzles, reading, adult coloring books, staying active) to keep memory sharp.   Bring a copy of your advance directives to your next office visit.  Shingrix Vaccine  Fall Prevention in the Home Falls can cause injuries. They can happen to people of all ages. There are many things you can do to make your home safe and to help prevent falls. What can I do on the outside of my home?  Regularly fix the edges of walkways and driveways and fix any cracks.  Remove anything that might make you trip as you walk through a door, such as a raised step or threshold.  Trim any bushes or trees on the path to your home.  Use bright outdoor lighting.  Clear any walking paths of anything that might make someone trip, such as rocks or tools.  Regularly check to see if handrails are loose or broken. Make sure that both sides of any steps have handrails.  Any raised decks and porches should have guardrails on the edges.  Have any leaves, snow, or ice cleared regularly.  Use sand or salt on walking paths during winter.  Clean up any spills in your garage right away. This includes oil or grease spills. What can I do in the bathroom?  Use night lights.  Install grab bars by the toilet and in the tub and shower. Do not use towel bars as grab bars.  Use non-skid mats or decals in the tub or shower.  If you need to sit down in the shower, use a plastic, non-slip stool.  Keep the floor dry. Clean up any water that spills on the floor as soon as it happens.  Remove soap buildup in the tub or shower regularly.  Attach bath mats securely with double-sided non-slip rug tape.  Do not have throw rugs and other things on the floor that can make you trip. What can I do in the bedroom?  Use night lights.  Make sure that you have a light by your bed that is easy to reach.  Do not use any  sheets or blankets that are too big for your bed. They should not hang down onto the floor.  Have a firm chair that has side arms. You can use this for support while you get dressed.  Do not have throw rugs and other things on the floor that can make you trip. What can I do in the kitchen?  Clean up any spills right away.  Avoid walking on wet floors.  Keep items that you use a lot in easy-to-reach places.  If you need to reach something above you, use a strong step stool that has a grab bar.  Keep electrical cords out of the way.  Do not use floor polish or wax that makes floors slippery. If you must use wax, use non-skid floor wax.  Do not have throw rugs and other things on the floor that can make you trip. What can I do with my stairs?  Do not leave any items on the stairs.  Make sure that there are handrails on both sides of the stairs and use them. Fix handrails that are broken or loose. Make sure that handrails are as long as the stairways.  Check any carpeting to make sure that it is firmly attached to the stairs. Fix any carpet that is loose or worn.  Avoid having throw rugs  at the top or bottom of the stairs. If you do have throw rugs, attach them to the floor with carpet tape.  Make sure that you have a light switch at the top of the stairs and the bottom of the stairs. If you do not have them, ask someone to add them for you. What else can I do to help prevent falls?  Wear shoes that: ? Do not have high heels. ? Have rubber bottoms. ? Are comfortable and fit you well. ? Are closed at the toe. Do not wear sandals.  If you use a stepladder: ? Make sure that it is fully opened. Do not climb a closed stepladder. ? Make sure that both sides of the stepladder are locked into place. ? Ask someone to hold it for you, if possible.  Clearly mark and make sure that you can see: ? Any grab bars or handrails. ? First and last steps. ? Where the edge of each step  is.  Use tools that help you move around (mobility aids) if they are needed. These include: ? Canes. ? Walkers. ? Scooters. ? Crutches.  Turn on the lights when you go into a dark area. Replace any light bulbs as soon as they burn out.  Set up your furniture so you have a clear path. Avoid moving your furniture around.  If any of your floors are uneven, fix them.  If there are any pets around you, be aware of where they are.  Review your medicines with your doctor. Some medicines can make you feel dizzy. This can increase your chance of falling. Ask your doctor what other things that you can do to help prevent falls. This information is not intended to replace advice given to you by your health care provider. Make sure you discuss any questions you have with your health care provider. Document Released: 06/28/2009 Document Revised: 02/07/2016 Document Reviewed: 10/06/2014 Elsevier Interactive Patient Education  2018 [place] Maintenance, Female Adopting a healthy lifestyle and getting preventive care can go a long way to promote health and wellness. Talk with your health care provider about what schedule of regular examinations is right for you. This is a good chance for you to check in with your provider about disease prevention and staying healthy. In between checkups, there are plenty of things you can do on your own. Experts have done a lot of research about which lifestyle changes and preventive measures are most likely to keep you healthy. Ask your health care provider for more information. Weight and diet Eat a healthy diet  Be sure to include plenty of vegetables, fruits, low-fat dairy products, and lean protein.  Do not eat a lot of foods high in solid fats, added sugars, or salt.  Get regular exercise. This is one of the most important things you can do for your health. ? Most adults should exercise for at least 150 minutes each week. The exercise should  increase your heart rate and make you sweat (moderate-intensity exercise). ? Most adults should also do strengthening exercises at least twice a week. This is in addition to the moderate-intensity exercise.  Maintain a healthy weight  Body mass index (BMI) is a measurement that can be used to identify possible weight problems. It estimates body fat based on height and weight. Your health care provider can help determine your BMI and help you achieve or maintain a healthy weight.  For females 89 years of age and older: ? A BMI  below 18.5 is considered underweight. ? A BMI of 18.5 to 24.9 is normal. ? A BMI of 25 to 29.9 is considered overweight. ? A BMI of 30 and above is considered obese.  Watch levels of cholesterol and blood lipids  You should start having your blood tested for lipids and cholesterol at 75 years of age, then have this test every 5 years.  You may need to have your cholesterol levels checked more often if: ? Your lipid or cholesterol levels are high. ? You are older than 75 years of age. ? You are at high risk for heart disease.  Cancer screening Lung Cancer  Lung cancer screening is recommended for adults 29-47 years old who are at high risk for lung cancer because of a history of smoking.  A yearly low-dose CT scan of the lungs is recommended for people who: ? Currently smoke. ? Have quit within the past 15 years. ? Have at least a 30-pack-year history of smoking. A pack year is smoking an average of one pack of cigarettes a day for 1 year.  Yearly screening should continue until it has been 15 years since you quit.  Yearly screening should stop if you develop a health problem that would prevent you from having lung cancer treatment.  Breast Cancer  Practice breast self-awareness. This means understanding how your breasts normally appear and feel.  It also means doing regular breast self-exams. Let your health care provider know about any changes, no matter  how small.  If you are in your 20s or 30s, you should have a clinical breast exam (CBE) by a health care provider every 1-3 years as part of a regular health exam.  If you are 97 or older, have a CBE every year. Also consider having a breast X-ray (mammogram) every year.  If you have a family history of breast cancer, talk to your health care provider about genetic screening.  If you are at high risk for breast cancer, talk to your health care provider about having an MRI and a mammogram every year.  Breast cancer gene (BRCA) assessment is recommended for women who have family members with BRCA-related cancers. BRCA-related cancers include: ? Breast. ? Ovarian. ? Tubal. ? Peritoneal cancers.  Results of the assessment will determine the need for genetic counseling and BRCA1 and BRCA2 testing.  Cervical Cancer Your health care provider may recommend that you be screened regularly for cancer of the pelvic organs (ovaries, uterus, and vagina). This screening involves a pelvic examination, including checking for microscopic changes to the surface of your cervix (Pap test). You may be encouraged to have this screening done every 3 years, beginning at age 46.  For women ages 70-65, health care providers may recommend pelvic exams and Pap testing every 3 years, or they may recommend the Pap and pelvic exam, combined with testing for human papilloma virus (HPV), every 5 years. Some types of HPV increase your risk of cervical cancer. Testing for HPV may also be done on women of any age with unclear Pap test results.  Other health care providers may not recommend any screening for nonpregnant women who are considered low risk for pelvic cancer and who do not have symptoms. Ask your health care provider if a screening pelvic exam is right for you.  If you have had past treatment for cervical cancer or a condition that could lead to cancer, you need Pap tests and screening for cancer for at least 20  years after  your treatment. If Pap tests have been discontinued, your risk factors (such as having a new sexual partner) need to be reassessed to determine if screening should resume. Some women have medical problems that increase the chance of getting cervical cancer. In these cases, your health care provider may recommend more frequent screening and Pap tests.  Colorectal Cancer  This type of cancer can be detected and often prevented.  Routine colorectal cancer screening usually begins at 75 years of age and continues through 75 years of age.  Your health care provider may recommend screening at an earlier age if you have risk factors for colon cancer.  Your health care provider may also recommend using home test kits to check for hidden blood in the stool.  A small camera at the end of a tube can be used to examine your colon directly (sigmoidoscopy or colonoscopy). This is done to check for the earliest forms of colorectal cancer.  Routine screening usually begins at age 33.  Direct examination of the colon should be repeated every 5-10 years through 75 years of age. However, you may need to be screened more often if early forms of precancerous polyps or small growths are found.  Skin Cancer  Check your skin from head to toe regularly.  Tell your health care provider about any new moles or changes in moles, especially if there is a change in a mole's shape or color.  Also tell your health care provider if you have a mole that is larger than the size of a pencil eraser.  Always use sunscreen. Apply sunscreen liberally and repeatedly throughout the day.  Protect yourself by wearing long sleeves, pants, a wide-brimmed hat, and sunglasses whenever you are outside.  Heart disease, diabetes, and high blood pressure  High blood pressure causes heart disease and increases the risk of stroke. High blood pressure is more likely to develop in: ? People who have blood pressure in the high  end of the normal range (130-139/85-89 mm Hg). ? People who are overweight or obese. ? People who are African American.  If you are 58-69 years of age, have your blood pressure checked every 3-5 years. If you are 39 years of age or older, have your blood pressure checked every year. You should have your blood pressure measured twice-once when you are at a hospital or clinic, and once when you are not at a hospital or clinic. Record the average of the two measurements. To check your blood pressure when you are not at a hospital or clinic, you can use: ? An automated blood pressure machine at a pharmacy. ? A home blood pressure monitor.  If you are between 10 years and 37 years old, ask your health care provider if you should take aspirin to prevent strokes.  Have regular diabetes screenings. This involves taking a blood sample to check your fasting blood sugar level. ? If you are at a normal weight and have a low risk for diabetes, have this test once every three years after 75 years of age. ? If you are overweight and have a high risk for diabetes, consider being tested at a younger age or more often. Preventing infection Hepatitis B  If you have a higher risk for hepatitis B, you should be screened for this virus. You are considered at high risk for hepatitis B if: ? You were born in a country where hepatitis B is common. Ask your health care provider which countries are considered high risk. ?  Your parents were born in a high-risk country, and you have not been immunized against hepatitis B (hepatitis B vaccine). ? You have HIV or AIDS. ? You use needles to inject street drugs. ? You live with someone who has hepatitis B. ? You have had sex with someone who has hepatitis B. ? You get hemodialysis treatment. ? You take certain medicines for conditions, including cancer, organ transplantation, and autoimmune conditions.  Hepatitis C  Blood testing is recommended for: ? Everyone born from  [birth year] through 1964. ? Anyone with known risk factors for hepatitis C.  Sexually transmitted infections (STIs)  You should be screened for sexually transmitted infections (STIs) including gonorrhea and chlamydia if: ? You are sexually active and are younger than 75 years of age. ? You are older than 75 years of age and your health care provider tells you that you are at risk for this type of infection. ? Your sexual activity has changed since you were last screened and you are at an increased risk for chlamydia or gonorrhea. Ask your health care provider if you are at risk.  If you do not have HIV, but are at risk, it may be recommended that you take a prescription medicine daily to prevent HIV infection. This is called pre-exposure prophylaxis (PrEP). You are considered at risk if: ? You are sexually active and do not regularly use condoms or know the HIV status of your partner(s). ? You take drugs by injection. ? You are sexually active with a partner who has HIV.  Talk with your health care provider about whether you are at high risk of being infected with HIV. If you choose to begin PrEP, you should first be tested for HIV. You should then be tested every 3 months for as long as you are taking PrEP. Pregnancy  If you are premenopausal and you may become pregnant, ask your health care provider about preconception counseling.  If you may become pregnant, take 400 to 800 micrograms (mcg) of folic acid every day.  If you want to prevent pregnancy, talk to your health care provider about birth control (contraception). Osteoporosis and menopause  Osteoporosis is a disease in which the bones lose minerals and strength with aging. This can result in serious bone fractures. Your risk for osteoporosis can be identified using a bone density scan.  If you are 44 years of age or older, or if you are at risk for osteoporosis and fractures, ask your health care provider if you should be  screened.  Ask your health care provider whether you should take a calcium or vitamin D supplement to lower your risk for osteoporosis.  Menopause may have certain physical symptoms and risks.  Hormone replacement therapy may reduce some of these symptoms and risks. Talk to your health care provider about whether hormone replacement therapy is right for you. Follow these instructions at home:  Schedule regular health, dental, and eye exams.  Stay current with your immunizations.  Do not use any tobacco products including cigarettes, chewing tobacco, or electronic cigarettes.  If you are pregnant, do not drink alcohol.  If you are breastfeeding, limit how much and how often you drink alcohol.  Limit alcohol intake to no more than 1 drink per day for nonpregnant women. One drink equals 12 ounces of beer, 5 ounces of wine, or 1 ounces of hard liquor.  Do not use street drugs.  Do not share needles.  Ask your health care provider for help if you  need support or information about quitting drugs.  Tell your health care provider if you often feel depressed.  Tell your health care provider if you have ever been abused or do not feel safe at home. This information is not intended to replace advice given to you by your health care provider. Make sure you discuss any questions you have with your health care provider. Document Released: 03/17/2011 Document Revised: 02/07/2016 Document Reviewed: 06/05/2015 Elsevier Interactive Patient Education  [employer].

## 2017-03-13 NOTE — Telephone Encounter (Signed)
Can we see if this needs a prior auth or if there is something on her formulary? We need to find her something she can use.  Thanks.

## 2017-03-13 NOTE — Telephone Encounter (Signed)
Left message on voice mail for patient to return call.  Januvia does not require a prior auth. If needing cheaper medication will need to contact insurance company to see what is available.

## 2017-03-16 NOTE — Progress Notes (Signed)
Patient has diminished ejection fraction at 16%. This could explain some of her pain. Can refer to general surgery if she is willing. I should be seeing her in August.

## 2017-03-19 ENCOUNTER — Other Ambulatory Visit: Payer: Self-pay

## 2017-03-19 DIAGNOSIS — R112 Nausea with vomiting, unspecified: Secondary | ICD-10-CM

## 2017-03-19 DIAGNOSIS — R1011 Right upper quadrant pain: Secondary | ICD-10-CM

## 2017-03-19 NOTE — Telephone Encounter (Signed)
Patient notified and verbalized understanding. She stated that she would contact insurance company and let us know if anything else can be substituted.

## 2017-04-09 ENCOUNTER — Ambulatory Visit (INDEPENDENT_AMBULATORY_CARE_PROVIDER_SITE_OTHER): Payer: Medicare Other | Admitting: General Surgery

## 2017-04-09 ENCOUNTER — Encounter: Payer: Self-pay | Admitting: General Surgery

## 2017-04-09 ENCOUNTER — Ambulatory Visit: Payer: Self-pay | Admitting: General Surgery

## 2017-04-09 VITALS — BP 149/62 | HR 75 | Temp 98.4°F | Resp 18 | Ht 63.0 in | Wt 153.0 lb

## 2017-04-09 DIAGNOSIS — K811 Chronic cholecystitis: Secondary | ICD-10-CM

## 2017-04-09 NOTE — H&P (Signed)
Elizabeth Mcintyre; [id number]; Feb 03, 1941   HPI   Patient is a 76 year old white female who is referred by care of by Dr. Chauncey Cruel for evaluation and treatment of chronic cholecystitis.  Patient states that she has had worsening right upper quadrant abdominal pain with radiation to the back, nausea, bloating, and Federico intolerance for more than a year.  Over the past few weeks, her symptoms have become almost daily in nature.  Her appetite is decreased.  She denies any fever, chills, or jaundice.  She currently has a pain 2/10. Patient's symptoms did worsen with HIDA scan. Past Medical History:  Diagnosis Date  . Asthma   . 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 DIABETES MELLITUS, TYPE II 11/09/2007  . Diverticulosis 03/2011  . Fatigue 07/22/2011  . GI bleed   . History of rheumatoid arthritis    during 30's, was treated.  . Hyperlipidemia   . Hypertension   . Stress incontinence   . Visual disturbance of one eye 12/05/2011    Past Surgical History:  Procedure Laterality Date  . ABDOMINAL HYSTERECTOMY    . CARDIAC CATHETERIZATION     X 2, last one in 1998  . COLONOSCOPY    . COLONOSCOPY  May 2012   Dr. Olevia Perches: mild diverticulosis, otherwise normal.   . ESOPHAGOGASTRODUODENOSCOPY N/A 01/28/2015   Dr. Gala Romney: reflux esophagitis, Schatzki's ring not manipulated due to recent bleeding  . ESOPHAGOGASTRODUODENOSCOPY N/A 03/30/2015   Dr. Gala Romney: Schatzki's ring s/p Venia Minks dilation, previously noted esophageal ulcer completely healed  . MALONEY DILATION N/A 03/30/2015   Procedure: Venia Minks DILATION;  Surgeon: Daneil Dolin, MD;  Location: [facility];  Service: Endoscopy;  Laterality: N/A;    Family History  Problem Relation Age of Onset  . Heart disease Mother   . Osteoarthritis Mother   . Sudden death Father   . Single kidney Father   . Other Father        h/o severe MVA injuries  . Hyperlipidemia Sister   . Other Daughter    Myalgias  . Fibromyalgia Daughter   . Allergies Daughter   . Heart disease Maternal Grandfather   . Sudden death Paternal Grandmother   . Diabetes Paternal Grandfather   . Heart disease Daughter   . Other Daughter        palpitations  . Pulmonary fibrosis Maternal Aunt   . Cancer Paternal Uncle   . Pulmonary fibrosis Maternal Aunt   . Colon cancer Neg Hx     Current Outpatient Prescriptions on File Prior to Visit  Medication Sig Dispense Refill  . acetaminophen (TYLENOL) 325 MG tablet Take 650 mg by mouth every 6 (six) hours as needed for moderate pain.     Marland Kitchen albuterol (PROVENTIL HFA;VENTOLIN HFA) 108 (90 Base) MCG/ACT inhaler Inhale 2 puffs into the lungs every 6 (six) hours as needed for wheezing. 1 Inhaler 2  . allopurinol (ZYLOPRIM) 100 MG tablet Take 1 tablet (100 mg total) by mouth daily. Needs office visit for further refills. 90 tablet 1  . amLODipine (NORVASC) 10 MG tablet Take 1 tablet (10 mg total) by mouth daily. 90 tablet 1  . Blood Glucose Monitoring Suppl (ONE TOUCH ULTRA SYSTEM KIT) W/DEVICE KIT 1 kit by Does not apply route once. 1 each 0  . Fluoxetine HCl, PMDD, 20 MG CAPS Take 1 capsule (20 mg total) by mouth daily. 90 each 1  . glucose blood (ONE TOUCH  ULTRA TEST) test strip Use 1 test strip to test blood sugar levels 3 times daily. Dx. E11.9 100 each 12  . levothyroxine (SYNTHROID, LEVOTHROID) 50 MCG tablet Take 1 tablet (50 mcg total) by mouth daily before breakfast. Patient needs office visit prior to anymore refills. 90 tablet 1  . LORazepam (ATIVAN) 0.5 MG tablet Take 0.5 mg by mouth daily as needed for anxiety.   0  . losartan (COZAAR) 50 MG tablet Take 1 tablet (50 mg total) by mouth daily. 90 tablet 1  . Omega-3 Fatty Acids (FISH OIL) 1000 MG CAPS Take 1 capsule by mouth daily. (pt thinks it may be 1034m)    . ONETOUCH DELICA LANCETS FINE MISC Test twice daily. 180 each 2  . pantoprazole (PROTONIX) 40 MG tablet Take 40 mg by mouth daily.    . rosuvastatin  (CRESTOR) 40 MG tablet Take 1 tablet (40 mg total) by mouth daily. 90 tablet 3  . vitamin B-12 (CYANOCOBALAMIN) 1000 MCG tablet Take 1,000 mcg by mouth daily.    . Vitamin D, Cholecalciferol, 400 UNITS CAPS Take 400 Units by mouth daily.    . Wheat Dextrin (BENEFIBER) POWD Take 1 scoop by mouth every other day.      No current facility-administered medications on file prior to visit.     Allergies  Allergen Reactions  . Codeine Phosphate Nausea And Vomiting and Rash    History  Alcohol Use No    History  Smoking Status  . Never Smoker  Smokeless Tobacco  . Never Used    Review of Systems  Constitutional: Positive for malaise/fatigue.  HENT: Negative.   Eyes: Negative.   Respiratory: Negative.   Cardiovascular: Negative.   Gastrointestinal: Positive for abdominal pain, heartburn and nausea.  Genitourinary: Positive for frequency.  Musculoskeletal: Negative.   Skin: Negative.   Neurological: Negative.   Endo/Heme/Allergies: Negative.   Psychiatric/Behavioral: Negative.     Objective   Vitals:   04/09/17 1130  BP: (!) 149/62  Pulse: 75  Resp: 18  Temp: 98.4 F (36.9 C)    Physical Exam  Constitutional: She is oriented to person, place, and time and well-developed, well-nourished, and in no distress.  HENT:  Head: Normocephalic and atraumatic.  Eyes: No scleral icterus.  Neck: Normal range of motion. Neck supple.  Cardiovascular: Normal rate, regular rhythm and normal heart sounds.   No murmur heard. Pulmonary/Chest: Effort normal and breath sounds normal. She has no wheezes. She has no rales.  Abdominal: Soft. Bowel sounds are normal. She exhibits no distension. There is tenderness. There is no rebound.  Slightly tender in the right upper quadrant palpation.  No rigidity noted.  Neurological: She is alert and oriented to person, place, and time.  Skin: Skin is warm and dry.  Vitals reviewed.    Ultrasound and HIDA scan reports reviewed Assessment     Chronic cholecystitis Plan    patient is scheduled for laparoscopic cholecystectomy on 04/13/2017.  Risks and benefits of the procedure including bleeding, infection, hepatobiliary injury, and the possibility of an open procedure were fully explained to the patient, who gave informed consent.

## 2017-04-09 NOTE — Patient Instructions (Signed)

## 2017-04-09 NOTE — Progress Notes (Signed)
Elizabeth Mcintyre; [id number]; 01-10-1941   HPI   Patient is a 76 year old white female who is referred by care of by Dr. Chauncey Cruel for evaluation and treatment of chronic cholecystitis.  Patient states that she has had worsening right upper quadrant abdominal pain with radiation to the back, nausea, bloating, and Federico intolerance for more than a year.  Over the past few weeks, her symptoms have become almost daily in nature.  Her appetite is decreased.  She denies any fever, chills, or jaundice.  She currently has a pain 2/10. Patient's symptoms did worsen with HIDA scan. Past Medical History:  Diagnosis Date  . Asthma   . 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 DIABETES MELLITUS, TYPE II 11/09/2007  . Diverticulosis 03/2011  . Fatigue 07/22/2011  . GI bleed   . History of rheumatoid arthritis    during 30's, was treated.  . Hyperlipidemia   . Hypertension   . Stress incontinence   . Visual disturbance of one eye 12/05/2011    Past Surgical History:  Procedure Laterality Date  . ABDOMINAL HYSTERECTOMY    . CARDIAC CATHETERIZATION     X 2, last one in 1998  . COLONOSCOPY    . COLONOSCOPY  May 2012   Dr. Olevia Perches: mild diverticulosis, otherwise normal.   . ESOPHAGOGASTRODUODENOSCOPY N/A 01/28/2015   Dr. Gala Romney: reflux esophagitis, Schatzki's ring not manipulated due to recent bleeding  . ESOPHAGOGASTRODUODENOSCOPY N/A 03/30/2015   Dr. Gala Romney: Schatzki's ring s/p Venia Minks dilation, previously noted esophageal ulcer completely healed  . MALONEY DILATION N/A 03/30/2015   Procedure: Venia Minks DILATION;  Surgeon: Daneil Dolin, MD;  Location: [facility];  Service: Endoscopy;  Laterality: N/A;    Family History  Problem Relation Age of Onset  . Heart disease Mother   . Osteoarthritis Mother   . Sudden death Father   . Single kidney Father   . Other Father        h/o severe MVA injuries  . Hyperlipidemia Sister   . Other Daughter    Myalgias  . Fibromyalgia Daughter   . Allergies Daughter   . Heart disease Maternal Grandfather   . Sudden death Paternal Grandmother   . Diabetes Paternal Grandfather   . Heart disease Daughter   . Other Daughter        palpitations  . Pulmonary fibrosis Maternal Aunt   . Cancer Paternal Uncle   . Pulmonary fibrosis Maternal Aunt   . Colon cancer Neg Hx     Current Outpatient Prescriptions on File Prior to Visit  Medication Sig Dispense Refill  . acetaminophen (TYLENOL) 325 MG tablet Take 650 mg by mouth every 6 (six) hours as needed for moderate pain.     Marland Kitchen albuterol (PROVENTIL HFA;VENTOLIN HFA) 108 (90 Base) MCG/ACT inhaler Inhale 2 puffs into the lungs every 6 (six) hours as needed for wheezing. 1 Inhaler 2  . allopurinol (ZYLOPRIM) 100 MG tablet Take 1 tablet (100 mg total) by mouth daily. Needs office visit for further refills. 90 tablet 1  . amLODipine (NORVASC) 10 MG tablet Take 1 tablet (10 mg total) by mouth daily. 90 tablet 1  . Blood Glucose Monitoring Suppl (ONE TOUCH ULTRA SYSTEM KIT) W/DEVICE KIT 1 kit by Does not apply route once. 1 each 0  . Fluoxetine HCl, PMDD, 20 MG CAPS Take 1 capsule (20 mg total) by mouth daily. 90 each 1  . glucose blood (ONE TOUCH  ULTRA TEST) test strip Use 1 test strip to test blood sugar levels 3 times daily. Dx. E11.9 100 each 12  . levothyroxine (SYNTHROID, LEVOTHROID) 50 MCG tablet Take 1 tablet (50 mcg total) by mouth daily before breakfast. Patient needs office visit prior to anymore refills. 90 tablet 1  . LORazepam (ATIVAN) 0.5 MG tablet Take 0.5 mg by mouth daily as needed for anxiety.   0  . losartan (COZAAR) 50 MG tablet Take 1 tablet (50 mg total) by mouth daily. 90 tablet 1  . Omega-3 Fatty Acids (FISH OIL) 1000 MG CAPS Take 1 capsule by mouth daily. (pt thinks it may be 104m)    . ONETOUCH DELICA LANCETS FINE MISC Test twice daily. 180 each 2  . pantoprazole (PROTONIX) 40 MG tablet Take 40 mg by mouth daily.    . rosuvastatin  (CRESTOR) 40 MG tablet Take 1 tablet (40 mg total) by mouth daily. 90 tablet 3  . vitamin B-12 (CYANOCOBALAMIN) 1000 MCG tablet Take 1,000 mcg by mouth daily.    . Vitamin D, Cholecalciferol, 400 UNITS CAPS Take 400 Units by mouth daily.    . Wheat Dextrin (BENEFIBER) POWD Take 1 scoop by mouth every other day.      No current facility-administered medications on file prior to visit.     Allergies  Allergen Reactions  . Codeine Phosphate Nausea And Vomiting and Rash    History  Alcohol Use No    History  Smoking Status  . Never Smoker  Smokeless Tobacco  . Never Used    Review of Systems  Constitutional: Positive for malaise/fatigue.  HENT: Negative.   Eyes: Negative.   Respiratory: Negative.   Cardiovascular: Negative.   Gastrointestinal: Positive for abdominal pain, heartburn and nausea.  Genitourinary: Positive for frequency.  Musculoskeletal: Negative.   Skin: Negative.   Neurological: Negative.   Endo/Heme/Allergies: Negative.   Psychiatric/Behavioral: Negative.     Objective   Vitals:   04/09/17 1130  BP: (!) 149/62  Pulse: 75  Resp: 18  Temp: 98.4 F (36.9 C)    Physical Exam  Constitutional: She is oriented to person, place, and time and well-developed, well-nourished, and in no distress.  HENT:  Head: Normocephalic and atraumatic.  Eyes: No scleral icterus.  Neck: Normal range of motion. Neck supple.  Cardiovascular: Normal rate, regular rhythm and normal heart sounds.   No murmur heard. Pulmonary/Chest: Effort normal and breath sounds normal. She has no wheezes. She has no rales.  Abdominal: Soft. Bowel sounds are normal. She exhibits no distension. There is tenderness. There is no rebound.  Slightly tender in the right upper quadrant palpation.  No rigidity noted.  Neurological: She is alert and oriented to person, place, and time.  Skin: Skin is warm and dry.  Vitals reviewed.    Ultrasound and HIDA scan reports reviewed Assessment     Chronic cholecystitis Plan    patient is scheduled for laparoscopic cholecystectomy on 04/13/2017.  Risks and benefits of the procedure including bleeding, infection, hepatobiliary injury, and the possibility of an open procedure were fully explained to the patient, who gave informed consent.

## 2017-04-10 ENCOUNTER — Encounter (HOSPITAL_COMMUNITY): Payer: Self-pay

## 2017-04-10 ENCOUNTER — Encounter (HOSPITAL_COMMUNITY)
Admission: RE | Admit: 2017-04-10 | Discharge: 2017-04-10 | Disposition: A | Discharge status: Home / Self Care | Payer: Medicare Other | Source: Ambulatory Visit | Attending: General Surgery | Admitting: General Surgery

## 2017-04-10 ENCOUNTER — Other Ambulatory Visit: Payer: Self-pay

## 2017-04-10 DIAGNOSIS — I10 Essential (primary) hypertension: Secondary | ICD-10-CM | POA: Diagnosis not present

## 2017-04-10 DIAGNOSIS — K8010 Calculus of gallbladder with chronic cholecystitis without obstruction: Secondary | ICD-10-CM | POA: Diagnosis not present

## 2017-04-10 DIAGNOSIS — F418 Other specified anxiety disorders: Secondary | ICD-10-CM | POA: Diagnosis not present

## 2017-04-10 DIAGNOSIS — J45909 Unspecified asthma, uncomplicated: Secondary | ICD-10-CM | POA: Diagnosis not present

## 2017-04-10 DIAGNOSIS — Z79899 Other long term (current) drug therapy: Secondary | ICD-10-CM | POA: Diagnosis not present

## 2017-04-10 DIAGNOSIS — E785 Hyperlipidemia, unspecified: Secondary | ICD-10-CM | POA: Diagnosis not present

## 2017-04-10 DIAGNOSIS — K210 Gastro-esophageal reflux disease with esophagitis: Secondary | ICD-10-CM | POA: Diagnosis not present

## 2017-04-10 DIAGNOSIS — M069 Rheumatoid arthritis, unspecified: Secondary | ICD-10-CM | POA: Diagnosis not present

## 2017-04-10 DIAGNOSIS — E119 Type 2 diabetes mellitus without complications: Secondary | ICD-10-CM | POA: Diagnosis not present

## 2017-04-10 HISTORY — DX: Nausea with vomiting, unspecified: R11.2

## 2017-04-10 HISTORY — DX: Other specified postprocedural states: Z98.890

## 2017-04-10 LAB — COMPREHENSIVE METABOLIC PANEL
ALT: 20 U/L (ref 14–54)
AST: 25 U/L (ref 15–41)
Albumin: 4.4 g/dL (ref 3.5–5.0)
Alkaline Phosphatase: 70 U/L (ref 38–126)
Anion gap: 9 (ref 5–15)
BUN: 20 mg/dL (ref 6–20)
CALCIUM: 9.5 mg/dL (ref 8.9–10.3)
CHLORIDE: 104 mmol/L (ref 101–111)
CO2: 25 mmol/L (ref 22–32)
CREATININE: 1.28 mg/dL — AB (ref 0.44–1.00)
GFR calc Af Amer: 47 mL/min — ABNORMAL LOW (ref >60)
GFR calc non Af Amer: 40 mL/min — ABNORMAL LOW (ref >60)
Glucose, Bld: 127 mg/dL — ABNORMAL HIGH (ref 65–99)
POTASSIUM: 4 mmol/L (ref 3.5–5.1)
Sodium: 138 mmol/L (ref 135–145)
TOTAL PROTEIN: 7.5 g/dL (ref 6.5–8.1)
Total Bilirubin: 0.5 mg/dL (ref 0.3–1.2)

## 2017-04-10 LAB — CBC WITH DIFFERENTIAL/PLATELET
BASOS ABS: 0 10*3/uL (ref 0.0–0.1)
Basophils Relative: 1 %
Eosinophils Absolute: 0.6 10*3/uL (ref 0.0–0.7)
Eosinophils Relative: 9 %
HEMATOCRIT: 36.5 % (ref 36.0–46.0)
Hemoglobin: 12.5 g/dL (ref 12.0–15.0)
Lymphocytes Relative: 27 %
Lymphs Abs: 1.7 10*3/uL (ref 0.7–4.0)
MCH: 28.6 pg (ref 26.0–34.0)
MCHC: 34.2 g/dL (ref 30.0–36.0)
MCV: 83.5 fL (ref 78.0–100.0)
MONO ABS: 0.7 10*3/uL (ref 0.1–1.0)
Monocytes Relative: 11 %
NEUTROS ABS: 3.3 10*3/uL (ref 1.7–7.7)
Neutrophils Relative %: 52 %
PLATELETS: 161 10*3/uL (ref 150–400)
RBC: 4.37 MIL/uL (ref 3.87–5.11)
RDW: 13.7 % (ref 11.5–15.5)
WBC: 6.3 10*3/uL (ref 4.0–10.5)

## 2017-04-10 NOTE — Patient Instructions (Addendum)
Your procedure is scheduled on: 04/13/2017  Report to [facility] at   8:30  AM.  Call this number if you have problems the morning of surgery: [phone]   Remember:   Do not drink or eat food:After Midnight.  :  Take these medicines the morning of surgery with A SIP OF WATER: levothyroxine  Use inhaler if needed   Do not wear jewelry, make-up or nail polish.  Do not wear lotions, powders, or perfumes. You may wear deodorant.  Do not shave 48 hours prior to surgery. Men may shave face and neck.  Do not bring valuables to the hospital.  Contacts, dentures or bridgework may not be worn into surgery.  Leave suitcase in the car. After surgery it may be brought to your room.  For patients admitted to the hospital, checkout time is 11:00 AM the day of discharge.   Patients discharged the day of surgery will not be allowed to drive home.    Special Instructions: Shower using CHG night before surgery and shower the day of surgery use CHG.  Use special wash - you have one bottle of CHG for all showers.  You should use approximately 1/2 of the bottle for each shower.  Laparoscopic Cholecystectomy, Care After This sheet gives you information about how to care for yourself after your procedure. Your health care provider may also give you more specific instructions. If you have problems or questions, contact your health care provider. What can I expect after the procedure? After the procedure, it is common to have:  Pain at your incision sites. You will be given medicines to control this pain.  Mild nausea or vomiting.  Bloating and possible shoulder pain from the air-like gas that was used during the procedure.  Follow these instructions at home: Incision care   Follow instructions from your health care provider about how to take care of your incisions. Make sure you: ? Wash your hands with soap and water before you change your bandage (dressing). If soap and water are not available,  use hand sanitizer. ? Change your dressing as told by your health care provider. ? Leave stitches (sutures), skin glue, or adhesive strips in place. These skin closures may need to be in place for 2 weeks or longer. If adhesive strip edges start to loosen and curl up, you may trim the loose edges. Do not remove adhesive strips completely unless your health care provider tells you to do that.  Do not take baths, swim, or use a hot tub until your health care provider approves. Ask your health care provider if you can take showers. You may only be allowed to take sponge baths for bathing.  Check your incision area every day for signs of infection. Check for: ? More redness, swelling, or pain. ? More fluid or blood. ? Warmth. ? Pus or a bad smell. Activity  Do not drive or use heavy machinery while taking prescription pain medicine.  Do not lift anything that is heavier than 10 lb (4.5 kg) until your health care provider approves.  Do not play contact sports until your health care provider approves.  Do not drive for 24 hours if you were given a medicine to help you relax (sedative).  Rest as needed. Do not return to work or school until your health care provider approves. General instructions  Take over-the-counter and prescription medicines only as told by your health care provider.  To prevent or treat constipation while you are taking  prescription pain medicine, your health care provider may recommend that you: ? Drink enough fluid to keep your urine clear or pale yellow. ? Take over-the-counter or prescription medicines. ? Eat foods that are high in fiber, such as fresh fruits and vegetables, whole grains, and beans. ? Limit foods that are high in fat and processed sugars, such as fried and sweet foods. Contact a health care provider if:  You develop a rash.  You have more redness, swelling, or pain around your incisions.  You have more fluid or blood coming from your  incisions.  Your incisions feel warm to the touch.  You have pus or a bad smell coming from your incisions.  You have a fever.  One or more of your incisions breaks open. Get help right away if:  You have trouble breathing.  You have chest pain.  You have increasing pain in your shoulders.  You faint or feel dizzy when you stand.  You have severe pain in your abdomen.  You have nausea or vomiting that lasts for more than one day.  You have leg pain. This information is not intended to replace advice given to you by your health care provider. Make sure you discuss any questions you have with your health care provider. Document Released: 09/01/2005 Document Revised: 03/22/2016 Document Reviewed: 02/18/2016 Elsevier Interactive Patient Education  2017 [place] Anesthesia, Adult, Care After These instructions provide you with information about caring for yourself after your procedure. Your health care provider may also give you more specific instructions. Your treatment has been planned according to current medical practices, but problems sometimes occur. Call your health care provider if you have any problems or questions after your procedure. What can I expect after the procedure? After the procedure, it is common to have:  Vomiting.  A sore throat.  Mental slowness.  It is common to feel:  Nauseous.  Cold or shivery.  Sleepy.  Tired.  Sore or achy, even in parts of your body where you did not have surgery.  Follow these instructions at home: For at least 24 hours after the procedure:  Do not: ? Participate in activities where you could fall or become injured. ? Drive. ? Use heavy machinery. ? Drink alcohol. ? Take sleeping pills or medicines that cause drowsiness. ? Make important decisions or sign legal documents. ? Take care of children on your own.  Rest. Eating and drinking  If you vomit, drink water, juice, or soup when you can drink  without vomiting.  Drink enough fluid to keep your urine clear or pale yellow.  Make sure you have little or no nausea before eating solid foods.  Follow the diet recommended by your health care provider. General instructions  Have a responsible adult stay with you until you are awake and alert.  Return to your normal activities as told by your health care provider. Ask your health care provider what activities are safe for you.  Take over-the-counter and prescription medicines only as told by your health care provider.  If you smoke, do not smoke without supervision.  Keep all follow-up visits as told by your health care provider. This is important. Contact a health care provider if:  You continue to have nausea or vomiting at home, and medicines are not helpful.  You cannot drink fluids or start eating again.  You cannot urinate after 8-12 hours.  You develop a skin rash.  You have fever.  You have increasing redness at the  site of your procedure. Get help right away if:  You have difficulty breathing.  You have chest pain.  You have unexpected bleeding.  You feel that you are having a life-threatening or urgent problem. This information is not intended to replace advice given to you by your health care provider. Make sure you discuss any questions you have with your health care provider. Document Released: 12/08/2000 Document Revised: 02/04/2016 Document Reviewed: 08/16/2015 Elsevier Interactive Patient Education  [employer].

## 2017-04-13 ENCOUNTER — Encounter (HOSPITAL_COMMUNITY): Payer: Self-pay

## 2017-04-13 ENCOUNTER — Ambulatory Visit (HOSPITAL_COMMUNITY): Payer: Medicare Other | Admitting: Anesthesiology

## 2017-04-13 ENCOUNTER — Ambulatory Visit (HOSPITAL_COMMUNITY)
Admission: RE | Admit: 2017-04-13 | Discharge: 2017-04-13 | Disposition: A | Discharge status: Home / Self Care | Payer: Medicare Other | Source: Ambulatory Visit | Attending: General Surgery | Admitting: General Surgery

## 2017-04-13 ENCOUNTER — Encounter (HOSPITAL_COMMUNITY)
Admission: RE | Disposition: A | Discharge status: Home / Self Care | Payer: Self-pay | Source: Ambulatory Visit | Attending: General Surgery

## 2017-04-13 DIAGNOSIS — J45909 Unspecified asthma, uncomplicated: Secondary | ICD-10-CM | POA: Diagnosis not present

## 2017-04-13 DIAGNOSIS — I10 Essential (primary) hypertension: Secondary | ICD-10-CM | POA: Diagnosis not present

## 2017-04-13 DIAGNOSIS — Z79899 Other long term (current) drug therapy: Secondary | ICD-10-CM | POA: Diagnosis not present

## 2017-04-13 DIAGNOSIS — E119 Type 2 diabetes mellitus without complications: Secondary | ICD-10-CM | POA: Insufficient documentation

## 2017-04-13 DIAGNOSIS — E785 Hyperlipidemia, unspecified: Secondary | ICD-10-CM | POA: Diagnosis not present

## 2017-04-13 DIAGNOSIS — E118 Type 2 diabetes mellitus with unspecified complications: Secondary | ICD-10-CM

## 2017-04-13 DIAGNOSIS — K8010 Calculus of gallbladder with chronic cholecystitis without obstruction: Secondary | ICD-10-CM | POA: Diagnosis not present

## 2017-04-13 DIAGNOSIS — K811 Chronic cholecystitis: Secondary | ICD-10-CM | POA: Diagnosis not present

## 2017-04-13 DIAGNOSIS — K210 Gastro-esophageal reflux disease with esophagitis: Secondary | ICD-10-CM | POA: Insufficient documentation

## 2017-04-13 DIAGNOSIS — F418 Other specified anxiety disorders: Secondary | ICD-10-CM | POA: Insufficient documentation

## 2017-04-13 DIAGNOSIS — M069 Rheumatoid arthritis, unspecified: Secondary | ICD-10-CM | POA: Insufficient documentation

## 2017-04-13 HISTORY — PX: CHOLECYSTECTOMY: SHX55

## 2017-04-13 LAB — GLUCOSE, CAPILLARY: GLUCOSE-CAPILLARY: 128 mg/dL — AB (ref 65–99)

## 2017-04-13 SURGERY — LAPAROSCOPIC CHOLECYSTECTOMY
Anesthesia: General

## 2017-04-13 MED ORDER — POVIDONE-IODINE 10 % OINT PACKET
TOPICAL_OINTMENT | CUTANEOUS | Status: DC | PRN
  Administered 2017-04-13: 1 via TOPICAL

## 2017-04-13 MED ORDER — FENTANYL CITRATE (PF) 100 MCG/2ML IJ SOLN
INTRAMUSCULAR | Status: AC
  Filled 2017-04-13: qty 2

## 2017-04-13 MED ORDER — GLYCOPYRROLATE 0.2 MG/ML IJ SOLN
INTRAMUSCULAR | Status: AC
Start: 2017-04-13 — End: ?
  Filled 2017-04-13: qty 1

## 2017-04-13 MED ORDER — FENTANYL CITRATE (PF) 250 MCG/5ML IJ SOLN
INTRAMUSCULAR | Status: AC
  Filled 2017-04-13: qty 5

## 2017-04-13 MED ORDER — ONDANSETRON HCL 4 MG/2ML IJ SOLN
4.0000 mg | Freq: Once | INTRAMUSCULAR | Status: AC
  Administered 2017-04-13: 4 mg via INTRAVENOUS

## 2017-04-13 MED ORDER — CIPROFLOXACIN IN D5W 400 MG/200ML IV SOLN
400.0000 mg | INTRAVENOUS | Status: AC
  Administered 2017-04-13: 400 mg via INTRAVENOUS
  Filled 2017-04-13: qty 200

## 2017-04-13 MED ORDER — NEOSTIGMINE METHYLSULFATE 10 MG/10ML IV SOLN
INTRAVENOUS | Status: DC | PRN
  Administered 2017-04-13: 4 mg via INTRAVENOUS
  Administered 2017-04-13: 1 mg via INTRAVENOUS

## 2017-04-13 MED ORDER — CHLORHEXIDINE GLUCONATE CLOTH 2 % EX PADS: 6.0000 | MEDICATED_PAD | Freq: Once | CUTANEOUS | Status: DC

## 2017-04-13 MED ORDER — HEMOSTATIC AGENTS (NO CHARGE) OPTIME
TOPICAL | Status: DC | PRN
  Administered 2017-04-13: 1 via TOPICAL

## 2017-04-13 MED ORDER — ONDANSETRON HCL 4 MG/2ML IJ SOLN
INTRAMUSCULAR | Status: AC
  Filled 2017-04-13: qty 2

## 2017-04-13 MED ORDER — DEXAMETHASONE SODIUM PHOSPHATE 4 MG/ML IJ SOLN
4.0000 mg | Freq: Once | INTRAMUSCULAR | Status: AC
  Administered 2017-04-13: 4 mg via INTRAVENOUS

## 2017-04-13 MED ORDER — LIDOCAINE HCL (CARDIAC) 10 MG/ML IV SOLN
INTRAVENOUS | Status: DC | PRN
  Administered 2017-04-13: 50 mg via INTRAVENOUS

## 2017-04-13 MED ORDER — PROMETHAZINE HCL 25 MG/ML IJ SOLN
6.2500 mg | Freq: Once | INTRAMUSCULAR | Status: AC
  Administered 2017-04-13: 6.25 mg via INTRAVENOUS

## 2017-04-13 MED ORDER — PROPOFOL 10 MG/ML IV BOLUS
INTRAVENOUS | Status: DC | PRN
  Administered 2017-04-13: 125 mg via INTRAVENOUS

## 2017-04-13 MED ORDER — ROCURONIUM BROMIDE 100 MG/10ML IV SOLN
INTRAVENOUS | Status: DC | PRN
  Administered 2017-04-13: 5 mg via INTRAVENOUS
  Administered 2017-04-13: 30 mg via INTRAVENOUS

## 2017-04-13 MED ORDER — GLYCOPYRROLATE 0.2 MG/ML IJ SOLN
INTRAMUSCULAR | Status: DC | PRN
  Administered 2017-04-13: 0.2 mg via INTRAVENOUS
  Administered 2017-04-13: .8 mg via INTRAVENOUS
  Administered 2017-04-13: 0.2 mg via INTRAVENOUS

## 2017-04-13 MED ORDER — KETOROLAC TROMETHAMINE 30 MG/ML IJ SOLN
INTRAMUSCULAR | Status: AC
  Filled 2017-04-13: qty 1

## 2017-04-13 MED ORDER — MIDAZOLAM HCL 2 MG/2ML IJ SOLN
1.0000 mg | INTRAMUSCULAR | Status: AC
  Administered 2017-04-13: 2 mg via INTRAVENOUS

## 2017-04-13 MED ORDER — FENTANYL CITRATE (PF) 100 MCG/2ML IJ SOLN
INTRAMUSCULAR | Status: DC | PRN
  Administered 2017-04-13 (×5): 50 ug via INTRAVENOUS

## 2017-04-13 MED ORDER — KETOROLAC TROMETHAMINE 30 MG/ML IJ SOLN
30.0000 mg | Freq: Once | INTRAMUSCULAR | Status: AC
  Administered 2017-04-13: 30 mg via INTRAVENOUS

## 2017-04-13 MED ORDER — SCOPOLAMINE 1 MG/3DAYS TD PT72
MEDICATED_PATCH | TRANSDERMAL | Status: AC
  Filled 2017-04-13: qty 1

## 2017-04-13 MED ORDER — TRAMADOL HCL 50 MG PO TABS
50.0000 mg | ORAL_TABLET | Freq: Four times a day (QID) | ORAL | 0 refills | Status: DC | PRN
Start: 2017-04-13 — End: 2017-10-15

## 2017-04-13 MED ORDER — LACTATED RINGERS IV SOLN
INTRAVENOUS | Status: DC
  Administered 2017-04-13: 09:00:00 via INTRAVENOUS

## 2017-04-13 MED ORDER — PROPOFOL 10 MG/ML IV BOLUS
INTRAVENOUS | Status: AC
  Filled 2017-04-13: qty 40

## 2017-04-13 MED ORDER — GLYCOPYRROLATE 0.2 MG/ML IJ SOLN
INTRAMUSCULAR | Status: AC
  Filled 2017-04-13: qty 3

## 2017-04-13 MED ORDER — SODIUM CHLORIDE 0.9 % IR SOLN
Status: DC | PRN
  Administered 2017-04-13: 1000 mL

## 2017-04-13 MED ORDER — BUPIVACAINE HCL (PF) 0.5 % IJ SOLN
INTRAMUSCULAR | Status: DC | PRN
  Administered 2017-04-13: 10 mL

## 2017-04-13 MED ORDER — BUPIVACAINE HCL (PF) 0.5 % IJ SOLN
INTRAMUSCULAR | Status: AC
  Filled 2017-04-13: qty 30

## 2017-04-13 MED ORDER — PROMETHAZINE HCL 25 MG/ML IJ SOLN
INTRAMUSCULAR | Status: AC
  Filled 2017-04-13: qty 1

## 2017-04-13 MED ORDER — SCOPOLAMINE 1 MG/3DAYS TD PT72
1.0000 | MEDICATED_PATCH | Freq: Once | TRANSDERMAL | Status: DC
  Administered 2017-04-13: 1.5 mg via TRANSDERMAL

## 2017-04-13 MED ORDER — MIDAZOLAM HCL 2 MG/2ML IJ SOLN
INTRAMUSCULAR | Status: AC
  Filled 2017-04-13: qty 2

## 2017-04-13 MED ORDER — FENTANYL CITRATE (PF) 100 MCG/2ML IJ SOLN
25.0000 ug | INTRAMUSCULAR | Status: DC | PRN
  Administered 2017-04-13 (×2): 50 ug via INTRAVENOUS

## 2017-04-13 MED ORDER — GLYCOPYRROLATE 0.2 MG/ML IJ SOLN
INTRAMUSCULAR | Status: AC
  Filled 2017-04-13: qty 1

## 2017-04-13 MED ORDER — DEXAMETHASONE SODIUM PHOSPHATE 4 MG/ML IJ SOLN
INTRAMUSCULAR | Status: AC
  Filled 2017-04-13: qty 1

## 2017-04-13 SURGICAL SUPPLY — 42 items
APPLIER CLIP LAPSCP 10X32 DD (CLIP) ×2 IMPLANT
BAG HAMPER (MISCELLANEOUS) ×2 IMPLANT
BAG RETRIEVAL 10 (BASKET) ×1
CHLORAPREP W/TINT 26ML (MISCELLANEOUS) ×2 IMPLANT
CLOTH BEACON ORANGE TIMEOUT ST (SAFETY) ×2 IMPLANT
COVER LIGHT HANDLE STERIS (MISCELLANEOUS) ×4 IMPLANT
DECANTER SPIKE VIAL GLASS SM (MISCELLANEOUS) ×2 IMPLANT
ELECT REM PT RETURN 9FT ADLT (ELECTROSURGICAL) ×2
ELECTRODE REM PT RTRN 9FT ADLT (ELECTROSURGICAL) ×1 IMPLANT
FILTER SMOKE EVAC LAPAROSHD (FILTER) ×2 IMPLANT
FORMALIN 10 PREFIL 120ML (MISCELLANEOUS) ×2 IMPLANT
GLOVE BIOGEL PI IND STRL 7.0 (GLOVE) ×1 IMPLANT
GLOVE BIOGEL PI INDICATOR 7.0 (GLOVE) ×1
GLOVE SURG SS PI 7.5 STRL IVOR (GLOVE) ×2 IMPLANT
GOWN STRL REUS W/ TWL XL LVL3 (GOWN DISPOSABLE) ×1 IMPLANT
GOWN STRL REUS W/TWL LRG LVL3 (GOWN DISPOSABLE) ×4 IMPLANT
GOWN STRL REUS W/TWL XL LVL3 (GOWN DISPOSABLE) ×2
HEMOSTAT SNOW SURGICEL 2X4 (HEMOSTASIS) ×2 IMPLANT
INST SET LAPROSCOPIC AP (KITS) ×2 IMPLANT
IV NS IRRIG 3000ML ARTHROMATIC (IV SOLUTION) IMPLANT
KIT ROOM TURNOVER APOR (KITS) ×2 IMPLANT
MANIFOLD NEPTUNE II (INSTRUMENTS) ×2 IMPLANT
NDL INSUFFLATION 14GA 120MM (NEEDLE) ×1 IMPLANT
NEEDLE INSUFFLATION 14GA 120MM (NEEDLE) ×2 IMPLANT
NS IRRIG 1000ML POUR BTL (IV SOLUTION) ×2 IMPLANT
PACK LAP CHOLE LZT030E (CUSTOM PROCEDURE TRAY) ×2 IMPLANT
PAD ARMBOARD 7.5X6 YLW CONV (MISCELLANEOUS) ×2 IMPLANT
SET BASIN LINEN APH (SET/KITS/TRAYS/PACK) ×2 IMPLANT
SET TUBE IRRIG SUCTION NO TIP (IRRIGATION / IRRIGATOR) IMPLANT
SLEEVE ENDOPATH XCEL 5M (ENDOMECHANICALS) ×2 IMPLANT
SPONGE GAUZE 2X2 8PLY STRL LF (GAUZE/BANDAGES/DRESSINGS) ×5 IMPLANT
STAPLER VISISTAT (STAPLE) ×2 IMPLANT
SUT VICRYL 0 UR6 27IN ABS (SUTURE) ×2 IMPLANT
SYS BAG RETRIEVAL 10MM (BASKET) ×1
SYSTEM BAG RETRIEVAL 10MM (BASKET) ×1 IMPLANT
TAPE PAPER 3X10 WHT MICROPORE (GAUZE/BANDAGES/DRESSINGS) ×1 IMPLANT
TROCAR ENDO BLADELESS 11MM (ENDOMECHANICALS) ×2 IMPLANT
TROCAR XCEL NON-BLD 5MMX100MML (ENDOMECHANICALS) ×2 IMPLANT
TROCAR XCEL UNIV SLVE 11M 100M (ENDOMECHANICALS) ×2 IMPLANT
TUBE CONNECTING 12X1/4 (SUCTIONS) ×2 IMPLANT
TUBING INSUFFLATION (TUBING) ×2 IMPLANT
WARMER LAPAROSCOPE (MISCELLANEOUS) ×2 IMPLANT

## 2017-04-13 NOTE — Anesthesia Procedure Notes (Signed)
Procedure Name: Intubation Date/Time: 04/13/2017 9:26 AM Performed by: Vista Deck Pre-anesthesia Checklist: Patient identified, Patient being monitored, Timeout performed, Emergency Drugs available and Suction available Patient Re-evaluated:Patient Re-evaluated prior to induction Oxygen Delivery Method: Circle System Utilized Preoxygenation: Pre-oxygenation with 100% oxygen Induction Type: IV induction Ventilation: Mask ventilation without difficulty and Oral airway inserted - appropriate to patient size Laryngoscope Size: Sabra Heck and 2 Grade View: Grade I Tube type: Oral Tube size: 7.0 mm Number of attempts: 1 Airway Equipment and Method: Stylet Placement Confirmation: ETT inserted through vocal cords under direct vision,  positive ETCO2 and breath sounds checked- equal and bilateral Secured at: 21 cm Tube secured with: Tape Dental Injury: Teeth and Oropharynx as per pre-operative assessment

## 2017-04-13 NOTE — Anesthesia Postprocedure Evaluation (Signed)
Anesthesia Post Note  Patient: Elizabeth Mcintyre  Procedure(s) Performed: Procedure(s) (LRB): LAPAROSCOPIC CHOLECYSTECTOMY (N/A)  Patient location during evaluation: Short Stay Anesthesia Type: General Level of consciousness: awake and alert Pain management: satisfactory to patient Vital Signs Assessment: post-procedure vital signs reviewed and stable Respiratory status: spontaneous breathing Cardiovascular status: stable Postop Assessment: no signs of nausea or vomiting Anesthetic complications: no     Last Vitals:  Vitals:   04/13/17 1155 04/13/17 1156  BP:    Pulse: 74 70  Resp: 10 10  Temp:      Last Pain:  Vitals:   04/13/17 1100  TempSrc:   PainSc: Asleep                 Randalyn Ahmed

## 2017-04-13 NOTE — Interval H&P Note (Signed)
History and Physical Interval Note:  04/13/2017 9:08 AM  Elizabeth Mcintyre  has presented today for surgery, with the diagnosis of chronic cholecystitis  The various methods of treatment have been discussed with the patient and family. After consideration of risks, benefits and other options for treatment, the patient has consented to  Procedure(s): LAPAROSCOPIC CHOLECYSTECTOMY (N/A) as a surgical intervention .  The patient's history has been reviewed, patient examined, no change in status, stable for surgery.  I have reviewed the patient's chart and labs.  Questions were answered to the patient's satisfaction.     Aviva Signs

## 2017-04-13 NOTE — Transfer of Care (Signed)
Immediate Anesthesia Transfer of Care Note  Patient: Elizabeth Mcintyre  Procedure(s) Performed: Procedure(s): LAPAROSCOPIC CHOLECYSTECTOMY (N/A)  Patient Location: PACU  Anesthesia Type:General  Level of Consciousness: awake and patient cooperative  Airway & Oxygen Therapy: Patient Spontanous Breathing and non-rebreather face mask  Post-op Assessment: Report given to RN and Post -op Vital signs reviewed and stable  Post vital signs: Reviewed and stable  Last Vitals:  Vitals:   04/13/17 0905 04/13/17 0910  BP: 140/76 140/65  Pulse:    Resp: 13 17  Temp:      Last Pain:  Vitals:   04/13/17 0846  TempSrc: Oral         Complications: No apparent anesthesia complications

## 2017-04-13 NOTE — Op Note (Signed)
Patient:  Elizabeth Mcintyre  DOB:  29-May-1941  MRN:  [id number]   Preop Diagnosis:  Chronic cholecystitis  Postop Diagnosis:  Same  Procedure:  Laparoscopic cholecystectomy  Surgeon:  Aviva Signs, M.D.  Anes:  Gen. endotracheal  Indications:  Patient is a 76 year old white female who presents with biliary colic secondary to chronic cholecystitis. The risks and benefits of the procedure including bleeding, infection, hepatobiliary injury, and the possibility of an open procedure were fully explained to the patient, who gave informed consent.  Procedure note:  The patient was placed in the supine position. After induction of general endotracheal anesthesia, the abdomen was prepped and draped using the usual sterile technique with DuraPrep. Surgical site confirmation was performed.  A supraumbilical incision was made down to the fascia. A Veress needle was introduced into the abdominal cavity and confirmation of placement was done using the saline drop test. The abdomen was then insufflated to 16 mmHg pressure. An 11 mm trocar was introduced into the abdominal cavity under direct visualization without difficulty. The patient was placed in reverse Trendelenburg position and an additional 11 mm trocar was placed the epigastric region and 5 mm trochars were placed the right upper quadrant and right flank regions. The liver was inspected and noted to be within normal limits. The gallbladder was retracted in a dynamic fashion in order to provide a critical view of the triangle of Calot. The cystic duct was first identified. Its juncture to the infundibulum was fully identified. Endoclips were placed proximally and distally on the cystic duct, and the cystic duct was divided. This was likewise done to the cystic artery. The gallbladder was freed away from the gallbladder fossa using Bovie electrocautery. The gallbladder was delivered through the epigastric trocar site using an Endo Catch bag. The  gallbladder fossa was inspected and no abnormal bleeding or bile leakage was noted. Surgicel was placed the gallbladder fossa. All fluid and air were then evacuated from the abdominal cavity prior to the removal of the trochars.  All wounds were irrigated with normal saline. All wounds were injected with 0.5% Sensorcaine. The supraumbilical fascia was reapproximated using an 0 Vicryl interrupted suture. All skin incisions were closed using staples. Betadine ointment and dry sterile dressings were applied.  All tape and needle counts were correct at the end of the procedure. The patient was extubated in the operating room and transferred to PACU in stable condition.  Complications:  None  EBL:  Minimal  Specimen:  Gallbladder

## 2017-04-13 NOTE — Anesthesia Preprocedure Evaluation (Signed)
Anesthesia Evaluation  Patient identified by MRN, date of birth, ID band Patient awake    History of Anesthesia Complications (+) PONV and history of anesthetic complications  Airway Mallampati: II  TM Distance: >3 FB Neck ROM: Full    Dental  (+) Teeth Intact   Pulmonary asthma ,    breath sounds clear to auscultation       Cardiovascular hypertension, Pt. on medications + Peripheral Vascular Disease (carotid disease)   Rhythm:Regular Rate:Normal     Neuro/Psych PSYCHIATRIC DISORDERS Anxiety Depression    GI/Hepatic GERD  ,  Endo/Other  diabetes, Type 2, Oral Hypoglycemic AgentsHypothyroidism Gout   Renal/GU Renal InsufficiencyRenal disease     Musculoskeletal   Abdominal   Peds  Hematology   Anesthesia Other Findings Nausea this am  Reproductive/Obstetrics                             Anesthesia Physical Anesthesia Plan  ASA: III  Anesthesia Plan: General   Post-op Pain Management:    Induction: Intravenous, Rapid sequence and Cricoid pressure planned  PONV Risk Score and Plan:   Airway Management Planned: Oral ETT  Additional Equipment:   Intra-op Plan:   Post-operative Plan: Extubation in OR  Informed Consent: I have reviewed the patients History and Physical, chart, labs and discussed the procedure including the risks, benefits and alternatives for the proposed anesthesia with the patient or authorized representative who has indicated his/her understanding and acceptance.     Plan Discussed with:   Anesthesia Plan Comments:         Anesthesia Quick Evaluation

## 2017-04-13 NOTE — Discharge Instructions (Signed)
Laparoscopic Cholecystectomy, Care After °This sheet gives you information about how to care for yourself after your procedure. Your health care provider may also give you more specific instructions. If you have problems or questions, contact your health care provider. °What can I expect after the procedure? °After the procedure, it is common to have: °· Pain at your incision sites. You will be given medicines to control this pain. °· Mild nausea or vomiting. °· Bloating and possible shoulder pain from the air-like gas that was used during the procedure. °Follow these instructions at home: °Incision care  ° °· Follow instructions from your health care provider about how to take care of your incisions. Make sure you: °¨ Wash your hands with soap and water before you change your bandage (dressing). If soap and water are not available, use hand sanitizer. °¨ Change your dressing as told by your health care provider. °¨ Leave stitches (sutures), skin glue, or adhesive strips in place. These skin closures may need to be in place for 2 weeks or longer. If adhesive strip edges start to loosen and curl up, you may trim the loose edges. Do not remove adhesive strips completely unless your health care provider tells you to do that. °· Do not take baths, swim, or use a hot tub until your health care provider approves. Ask your health care provider if you can take showers. You may only be allowed to take sponge baths for bathing. °· Check your incision area every day for signs of infection. Check for: °¨ More redness, swelling, or pain. °¨ More fluid or blood. °¨ Warmth. °¨ Pus or a bad smell. °Activity  °· Do not drive or use heavy machinery while taking prescription pain medicine. °· Do not lift anything that is heavier than 10 lb (4.5 kg) until your health care provider approves. °· Do not play contact sports until your health care provider approves. °· Do not drive for 24 hours if you were given a medicine to help you relax  (sedative). °· Rest as needed. Do not return to work or school until your health care provider approves. °General instructions  °· Take over-the-counter and prescription medicines only as told by your health care provider. °· To prevent or treat constipation while you are taking prescription pain medicine, your health care provider may recommend that you: °¨ Drink enough fluid to keep your urine clear or pale yellow. °¨ Take over-the-counter or prescription medicines. °¨ Eat foods that are high in fiber, such as fresh fruits and vegetables, whole grains, and beans. °¨ Limit foods that are high in fat and processed sugars, such as fried and sweet foods. °Contact a health care provider if: °· You develop a rash. °· You have more redness, swelling, or pain around your incisions. °· You have more fluid or blood coming from your incisions. °· Your incisions feel warm to the touch. °· You have pus or a bad smell coming from your incisions. °· You have a fever. °· One or more of your incisions breaks open. °Get help right away if: °· You have trouble breathing. °· You have chest pain. °· You have increasing pain in your shoulders. °· You faint or feel dizzy when you stand. °· You have severe pain in your abdomen. °· You have nausea or vomiting that lasts for more than one day. °· You have leg pain. °This information is not intended to replace advice given to you by your health care provider. Make sure you discuss any   questions you have with your health care provider. °Document Released: 09/01/2005 Document Revised: 03/22/2016 Document Reviewed: 02/18/2016 °Elsevier Interactive Patient Education © 2017 Elsevier Inc. ° °

## 2017-04-14 ENCOUNTER — Encounter (HOSPITAL_COMMUNITY): Payer: Self-pay | Admitting: General Surgery

## 2017-04-21 ENCOUNTER — Other Ambulatory Visit: Payer: Self-pay

## 2017-04-21 ENCOUNTER — Ambulatory Visit: Payer: Self-pay

## 2017-04-22 ENCOUNTER — Ambulatory Visit: Payer: Medicare Other | Admitting: Gastroenterology

## 2017-04-28 ENCOUNTER — Encounter: Payer: Self-pay | Admitting: General Surgery

## 2017-04-28 ENCOUNTER — Ambulatory Visit (INDEPENDENT_AMBULATORY_CARE_PROVIDER_SITE_OTHER): Payer: Self-pay | Admitting: General Surgery

## 2017-04-28 VITALS — BP 138/61 | HR 86 | Temp 98.0°F | Resp 18 | Ht 63.0 in | Wt 147.0 lb

## 2017-04-28 DIAGNOSIS — Z09 Encounter for follow-up examination after completed treatment for conditions other than malignant neoplasm: Secondary | ICD-10-CM

## 2017-04-28 NOTE — Progress Notes (Signed)
Subjective:     Elizabeth Mcintyre  Status post laparoscopic cholecystectomy. Patient states she feels great. Her preoperative symptoms have resolved. Objective:    BP 138/61   Pulse 86   Temp 98 F (36.7 C)   Resp 18   Ht 5\' 3"  (1.6 m)   Wt 147 lb (66.7 kg)   BMI 26.04 kg/m   General:  alert, cooperative and no distress  Abdomen soft, incisions healing well. Staples removed, Steri-Strips applied. Final pathology consistent with diagnosis     Assessment:    Doing well postoperatively.    Plan:   Increase activity as able. Follow-up as needed.

## 2017-05-04 ENCOUNTER — Ambulatory Visit
Admission: RE | Admit: 2017-05-04 | Discharge: 2017-05-04 | Disposition: A | Discharge status: Home / Self Care | Payer: Medicare Other | Source: Ambulatory Visit | Attending: Family Medicine | Admitting: Family Medicine

## 2017-05-04 DIAGNOSIS — E2839 Other primary ovarian failure: Secondary | ICD-10-CM

## 2017-05-04 DIAGNOSIS — Z1231 Encounter for screening mammogram for malignant neoplasm of breast: Secondary | ICD-10-CM | POA: Diagnosis not present

## 2017-05-04 DIAGNOSIS — Z780 Asymptomatic menopausal state: Secondary | ICD-10-CM | POA: Diagnosis not present

## 2017-05-04 DIAGNOSIS — Z1239 Encounter for other screening for malignant neoplasm of breast: Secondary | ICD-10-CM

## 2017-05-04 DIAGNOSIS — M8589 Other specified disorders of bone density and structure, multiple sites: Secondary | ICD-10-CM | POA: Diagnosis not present

## 2017-05-06 ENCOUNTER — Encounter: Payer: Self-pay | Admitting: Family Medicine

## 2017-05-06 ENCOUNTER — Telehealth: Payer: Self-pay | Admitting: Family Medicine

## 2017-05-06 DIAGNOSIS — M8580 Other specified disorders of bone density and structure, unspecified site: Secondary | ICD-10-CM

## 2017-05-06 NOTE — Telephone Encounter (Signed)
Please call patient -  her mammogram was normal. - Her bone density showed resulted with mildly worsening osteopenia, which is bone softening.   - Preventative measures she can do at home are ensuring vitamin D and calcium levels are supplemented adequately. Recommendations of calcium 1000-1200 mg a day and vitamin D 1000 units a day. Walking even 15 minutes daily helps with bone health since it is a weightbearing exercise. - We will monitor her bone density every 2-3 years. - Please encourage patient to have an eye exam. Diabetics should have a yearly eye exam to monitor blood vessels behind the eye which can be affected by diabetes.

## 2017-05-06 NOTE — Telephone Encounter (Signed)
Patient called and I was unable to leave message, Voice mail is full. Will call back at later time.

## 2017-05-07 NOTE — Telephone Encounter (Signed)
MyChart message sent. Unable to leave message on voice mail.

## 2017-05-08 NOTE — Telephone Encounter (Signed)
Patient notified and verbalized understanding. 

## 2017-05-22 ENCOUNTER — Ambulatory Visit: Payer: Medicare Other | Admitting: Gastroenterology

## 2017-06-17 DIAGNOSIS — H5203 Hypermetropia, bilateral: Secondary | ICD-10-CM | POA: Diagnosis not present

## 2017-06-17 DIAGNOSIS — H524 Presbyopia: Secondary | ICD-10-CM | POA: Diagnosis not present

## 2017-06-17 DIAGNOSIS — E119 Type 2 diabetes mellitus without complications: Secondary | ICD-10-CM | POA: Diagnosis not present

## 2017-06-17 DIAGNOSIS — H52221 Regular astigmatism, right eye: Secondary | ICD-10-CM | POA: Diagnosis not present

## 2017-06-17 DIAGNOSIS — H2513 Age-related nuclear cataract, bilateral: Secondary | ICD-10-CM | POA: Diagnosis not present

## 2017-10-15 ENCOUNTER — Other Ambulatory Visit: Payer: Self-pay

## 2017-10-15 ENCOUNTER — Encounter: Payer: Self-pay | Admitting: Family Medicine

## 2017-10-15 ENCOUNTER — Ambulatory Visit (INDEPENDENT_AMBULATORY_CARE_PROVIDER_SITE_OTHER): Payer: Medicare Other | Admitting: Family Medicine

## 2017-10-15 VITALS — BP 130/72 | HR 89 | Temp 98.1°F | Ht 63.25 in | Wt 146.0 lb

## 2017-10-15 DIAGNOSIS — E1169 Type 2 diabetes mellitus with other specified complication: Secondary | ICD-10-CM

## 2017-10-15 DIAGNOSIS — N183 Chronic kidney disease, stage 3 unspecified: Secondary | ICD-10-CM

## 2017-10-15 DIAGNOSIS — E1122 Type 2 diabetes mellitus with diabetic chronic kidney disease: Secondary | ICD-10-CM

## 2017-10-15 DIAGNOSIS — Z23 Encounter for immunization: Secondary | ICD-10-CM

## 2017-10-15 DIAGNOSIS — E782 Mixed hyperlipidemia: Secondary | ICD-10-CM | POA: Diagnosis not present

## 2017-10-15 DIAGNOSIS — E039 Hypothyroidism, unspecified: Secondary | ICD-10-CM | POA: Diagnosis not present

## 2017-10-15 DIAGNOSIS — J4520 Mild intermittent asthma, uncomplicated: Secondary | ICD-10-CM | POA: Diagnosis not present

## 2017-10-15 DIAGNOSIS — E118 Type 2 diabetes mellitus with unspecified complications: Secondary | ICD-10-CM

## 2017-10-15 DIAGNOSIS — F339 Major depressive disorder, recurrent, unspecified: Secondary | ICD-10-CM

## 2017-10-15 DIAGNOSIS — N904 Leukoplakia of vulva: Secondary | ICD-10-CM | POA: Diagnosis not present

## 2017-10-15 DIAGNOSIS — I10 Essential (primary) hypertension: Secondary | ICD-10-CM

## 2017-10-15 DIAGNOSIS — M109 Gout, unspecified: Secondary | ICD-10-CM

## 2017-10-15 HISTORY — DX: Leukoplakia of vulva: N90.4

## 2017-10-15 LAB — COMPREHENSIVE METABOLIC PANEL
ALBUMIN: 5.1 g/dL (ref 3.5–5.2)
ALK PHOS: 83 U/L (ref 39–117)
ALT: 15 U/L (ref 0–35)
AST: 19 U/L (ref 0–37)
BILIRUBIN TOTAL: 0.5 mg/dL (ref 0.2–1.2)
BUN: 22 mg/dL (ref 6–23)
CALCIUM: 10.2 mg/dL (ref 8.4–10.5)
CO2: 27 mEq/L (ref 19–32)
CREATININE: 1.69 mg/dL — AB (ref 0.40–1.20)
Chloride: 102 mEq/L (ref 96–112)
GFR: 31.32 mL/min — ABNORMAL LOW (ref >60.00)
Glucose, Bld: 129 mg/dL — ABNORMAL HIGH (ref 70–99)
Potassium: 4.2 mEq/L (ref 3.5–5.1)
Sodium: 139 mEq/L (ref 135–145)
TOTAL PROTEIN: 8.3 g/dL (ref 6.0–8.3)

## 2017-10-15 LAB — LDL CHOLESTEROL, DIRECT: LDL DIRECT: 67 mg/dL

## 2017-10-15 LAB — LIPID PANEL
CHOLESTEROL: 157 mg/dL (ref 0–200)
HDL: 51.8 mg/dL (ref >39.00)
NONHDL: 105.41
TRIGLYCERIDES: 255 mg/dL — AB (ref 0.0–149.0)
Total CHOL/HDL Ratio: 3
VLDL: 51 mg/dL — ABNORMAL HIGH (ref 0.0–40.0)

## 2017-10-15 LAB — URIC ACID: URIC ACID, SERUM: 5.3 mg/dL (ref 2.4–7.0)

## 2017-10-15 LAB — TSH: TSH: 2.81 u[IU]/mL (ref 0.35–4.50)

## 2017-10-15 LAB — HEMOGLOBIN A1C: Hgb A1c MFr Bld: 6.3 % (ref 4.6–6.5)

## 2017-10-15 MED ORDER — AZITHROMYCIN 250 MG PO TABS: ORAL_TABLET | ORAL | 0 refills | Status: DC

## 2017-10-15 MED ORDER — ALBUTEROL SULFATE HFA 108 (90 BASE) MCG/ACT IN AERS: 2.0000 | INHALATION_SPRAY | Freq: Four times a day (QID) | RESPIRATORY_TRACT | 2 refills | Status: DC | PRN

## 2017-10-15 MED ORDER — TRIAMCINOLONE ACETONIDE 0.1 % EX CREA: 1.0000 "application " | TOPICAL_CREAM | Freq: Two times a day (BID) | CUTANEOUS | 0 refills | Status: DC

## 2017-10-15 NOTE — Patient Instructions (Signed)
Please return in 6-8 weeks to recheck thyroid and rash.  It was a pleasure meeting you today! Thank you for choosing [place] to meet your healthcare needs! I truly look forward to working with you. If you have any questions or concerns, please send me a message via Mychart or call the office at [phone].   Lichen Sclerosus Lichen sclerosus is a skin problem. It can happen on any part of the body. It happens most often in the anal or genital areas. It can cause itching and discomfort. Treatment can help to control symptoms. This skin problem is not passed from one person to another (not contagious). The cause is not known. Follow these instructions at home:  Take over-the-counter and prescription medicines only as told by your doctor.  Use creams or ointments as told by your doctor.  Do not scratch the affected areas of skin.  Women should keep the vagina as clean and dry as they can.  Keep all follow-up visits as told by your doctor. This is important. Contact a doctor if:  Your redness, swelling, or pain gets worse.  You have fluid, blood, or pus coming from the area.  You have new patches (lesions) on your skin.  You have a fever.  You have pain during sex. This information is not intended to replace advice given to you by your health care provider. Make sure you discuss any questions you have with your health care provider. Document Released: 08/14/2008 Document Revised: 02/07/2016 Document Reviewed: 11/27/2014 Elsevier Interactive Patient Education  [employer].

## 2017-10-15 NOTE — Progress Notes (Signed)
Subjective  CC:  Chief Complaint  Patient presents with  . Establish Care  . Cough    x 3 weeks   . Diabetes    HPI: Elizabeth Mcintyre is a 77 y.o. female who presents to the office today for follow up of diabetes, hypertension and problems listed above in the chief complaint. She is here to establish care; transferring from [facility]. I have reviewed extensive notes from chart. Last labs 01/2017  Diabetic f/u: Her diabetic control is reported as Unchanged. Well controlled diabetes now off all meds /wo sxs of hyperglycemia.  She denies exertional CP or SOB or symptomatic hypoglycemia. She denies foot sores and no neuropathy sxs. 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 Up to date on imms and eye exam.    Hypertension f/u: Control is good . Pt reports she is doing well. taking medications as instructed, no medication side effects noted, no TIAs, no chest pain on exertion, no dyspnea on exertion, no swelling of ankles. On ARB.  She denies adverse effects from his BP medications. Compliance with medication is good.   Hypothyroidism f/u: Elizabeth Mcintyre is a 77 y.o. female who presents for follow up of hypothyroidism. Last TSH showed control was excellent, and thyroid supplement medication was adjusted accordingly.  Current symptoms: change in energy level and feeling more down.  . Patient denies heat / cold intolerance, nervousness and palpitations. Symptoms have gradually worsened.She has not been compliant with the medication - reports just restarted it after a 2 month hiatus due to "fogot about it". Due for lab recheck.   Hyperlipidemia f/u: Patient presents for follow up of lipids. Lipids have been well controlled on meds w/o myalgias or side effects. Compliance with treatment thus far has been good. The patient does not use medications that may worsen dyslipidemias (corticosteroids, progestins, anabolic steroids, diuretics,  beta-blockers, amiodarone, cyclosporine, olanzapine). The patient exercises intermittently. The patient is not known to have coexisting coronary artery disease with most recent stress testing done in 2016 and negative. However, Carotid artery stenosis is present on chart.   C/o cough x 1 week: had bronchitic sxs with wheezing although not sob. Has asthma with prn albuterol use that is rare. No fevers or chest pain or GI sxs. Using otc robitussin w/o resolution of sxs.   CKD, stable  C/o vulvar itching for several years. Prior pcp treated with estrogen creams and hot water but still itching. Denies rash. No vaginal discharge. Has been treated for yeast vag without change in sxs.   Osteopenia with elevated FRAX score - not on treatment  Major chronic depression on prozac with only fair control. Worsening over last several months - mainly more fatigued and less motivated.   I reviewed the patients updated PMH, FH, and SocHx.  Patient Active Problem List   Diagnosis Date Noted  . Combined hyperlipidemia associated with type 2 diabetes mellitus ([place]) 01/29/2017    Priority: High  . CKD stage 3 secondary to diabetes ([place]) 02/14/2016    Priority: High  . Schatzki's ring     Priority: High  . Type 2 diabetes mellitus with complication ([place]) 68/08/7516    Priority: High  . Acquired hypothyroidism 05/30/2014    Priority: High  . Major depression, recurrent, chronic ([place]) 04/03/2011    Priority: High  . Essential hypertension, benign 11/09/2007    Priority: High  . Gastroesophageal reflux disease with esophagitis 03/06/2015  Priority: Medium  . Gout 06/09/2014    Priority: Medium  . Osteopenia 12/02/2013    Priority: Medium  . Mild intermittent asthma 11/09/2007    Priority: Medium  . Vitamin D deficiency 05/25/2015    Priority: Low  . 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 Carotid artery stenosis 06/13/2016   Immunization History  Administered Date(s)  Administered  . Influenza Split 07/16/2011  . Influenza,inj,Quad PF,6+ Mos 06/09/2014, 10/15/2015  . Pneumococcal Conjugate-13 03/13/2017  . Pneumococcal Polysaccharide-23 10/27/2014  . Tdap 07/16/2011   Health Maintenance  Topic Date Due  . INFLUENZA VACCINE  04/15/2017  . HEMOGLOBIN A1C  08/01/2017  . LIPID PANEL  01/29/2018  . MAMMOGRAM  05/04/2018  . DEXA SCAN  05/04/2018  . OPHTHALMOLOGY EXAM  08/29/2018  . FOOT EXAM  10/15/2018  . COLONOSCOPY  01/29/2021  . TETANUS/TDAP  07/15/2021  . PNA vac Low Risk Adult  Completed   Diabetes and HTN Related Lab Review: Lab Results  Component Value Date   HGBA1C 5.6 01/29/2017   Lab Results  Component Value Date   MICROALBUR 2.2 (H) 04/07/2014   Lab Results  Component Value Date   CREATININE 1.28 (H) 04/10/2017   BUN 20 04/10/2017   NA 138 04/10/2017   K 4.0 04/10/2017   CL 104 04/10/2017   CO2 25 04/10/2017   Lab Results  Component Value Date   CHOL 187 01/29/2017   CHOL 161 02/23/2015   CHOL 287 (H) 11/02/2014   Lab Results  Component Value Date   HDL 53.00 01/29/2017   HDL 46.90 02/23/2015   HDL 52 11/02/2014  direct LDL 76 Lab Results  Component Value Date   LDLCALC 176 (H) 11/02/2014   LDLCALC 27 09/01/2013   LDLCALC 31 07/11/2011   Lab Results  Component Value Date   TRIG 345.0 (H) 01/29/2017   TRIG 243.0 (H) 02/23/2015   TRIG 296 (H) 11/02/2014   Lab Results  Component Value Date   CHOLHDL 4 01/29/2017   CHOLHDL 3 02/23/2015   CHOLHDL 5.5 11/02/2014   Lab Results  Component Value Date   LDLDIRECT 76.0 01/29/2017   LDLDIRECT 75.0 02/23/2015   LDLDIRECT 84.0 04/07/2014   Lab Results  Component Value Date   TSH 1.76 09/03/2015    The 10-year ASCVD risk score Mikey Bussing DC Jr., et al., 2013) is: 37.4%   Values used to calculate the score:     Age: 61 years     Sex: Female     Is Non-Hispanic African American: No     Diabetic: Yes     Tobacco smoker: No     Systolic Blood Pressure: 097 mmHg      Is BP treated: Yes     HDL Cholesterol: 53 mg/dL     Total Cholesterol: 187 mg/dL  BP Readings from Last 3 Encounters:  10/15/17 130/72  04/28/17 138/61  04/13/17 130/65    Allergies: Patient is allergic to codeine phosphate. Family History: Patient family history includes Allergies in her daughter; Cancer in her paternal uncle; Diabetes in her paternal grandfather; Fibromyalgia in her daughter; Heart disease in her daughter, maternal grandfather, and mother; Hyperlipidemia in her sister; Osteoarthritis in her mother; Other in her daughter, daughter, and father; Pulmonary fibrosis in her maternal aunt and maternal aunt; Single kidney in her father; Sudden death in her father and paternal grandmother. Social History:  Patient  reports that  has never smoked. she has never used smokeless tobacco. She reports that  she does not drink alcohol or use drugs.  Review of Systems: Ophthalmic: negative for eye pain, loss of vision or double vision Cardiovascular: negative for chest pain Respiratory: negative for SOB  + cough and wheeze Gastrointestinal: negative for abdominal pain Genitourinary: negative for dysuria or gross hematuria MSK: negative for foot lesions Neurologic: negative for weakness or gait disturbance Current Meds  Medication Sig  . acetaminophen (TYLENOL) 325 MG tablet Take 650 mg by mouth every 6 (six) hours as needed for moderate pain.   Marland Kitchen allopurinol (ZYLOPRIM) 100 MG tablet Take 1 tablet (100 mg total) by mouth daily. Needs office visit for further refills.  Marland Kitchen amLODipine (NORVASC) 10 MG tablet Take 1 tablet (10 mg total) by mouth daily.  . Blood Glucose Monitoring Suppl (ONE TOUCH ULTRA SYSTEM KIT) W/DEVICE KIT 1 kit by Does not apply route once.  . Fluoxetine HCl, PMDD, 20 MG CAPS Take 1 capsule (20 mg total) by mouth daily.  Marland Kitchen glucose blood (ONE TOUCH ULTRA TEST) test strip Use 1 test strip to test blood sugar levels 3 times daily. Dx. E11.9  . losartan (COZAAR) 50  MG tablet Take 1 tablet (50 mg total) by mouth daily.  Glory Rosebush DELICA LANCETS FINE MISC Test twice daily.  . pantoprazole (PROTONIX) 40 MG tablet Take 40 mg by mouth daily.  . rosuvastatin (CRESTOR) 40 MG tablet Take 1 tablet (40 mg total) by mouth daily.  . vitamin B-12 (CYANOCOBALAMIN) 1000 MCG tablet Take 1,000 mcg by mouth daily.  . Vitamin D, Cholecalciferol, 400 UNITS CAPS Take 400 Units by mouth daily.    Objective  Vitals: BP 130/72   Pulse 89   Temp 98.1 F (36.7 C)   Ht 5' 3.25" (1.607 m)   Wt 146 lb (66.2 kg)   BMI 25.66 kg/m  General: well appearing, no acute distress, very pleasant Psych:  Alert and oriented x 3, normal mood and affect HEENT:  Normocephalic, atraumatic, moist mucous membranes, supple neck  Cardiovascular:  Nl S1 and S2, RRR without murmur, gallop or rub. no edema Respiratory:  Good breath sounds bilaterally, CTAB except for rare mild wheeze with normal effort, no rales Gastrointestinal: normal BS, soft, nontender GU: vulva and labia majora with hyperpigmented induration and labial minora with hypopigmentation and thinning of skin Skin:  Warm, no rashes Neurologic:   Mental status is normal. normal gait Foot exam: no erythema, pallor, or cyanosis visible nl proprioception and sensation to monofilament testing bilaterally, +2 distal pulses bilaterally  Assessment  1. Type 2 diabetes mellitus with complication, without long-term current use of insulin ([place])   2. Essential hypertension, benign   3. Combined hyperlipidemia associated with type 2 diabetes mellitus ([place])   4. Acquired hypothyroidism   5. CKD stage 3 secondary to diabetes ([place])   6. Gout of big toe   7. Lichen sclerosus et atrophicus of the vulva   8. Mild intermittent asthma without complication   9. Major depression, recurrent, chronic ([place])      Plan   Diabetes is currently very well controlled. Recheck off meds. No meds recommended unless a1c > 7. DM care is up to date.    Hypertension is currently very well controlled. Continue arb. Check renal and lytes  hyperlipidemia: recheck lipids on statin  Hypothyroidism: recheck levels, expect to be low due to noncompliance with meds. Could be cause of fatigue and mood change as well. Education given. On 49mg daily now. Recheck 6-8 weeks and then adjust dose as needed.  Asthma with bronchitis: zpak and inhaler. Supportive care. Mild.  Recheck uric acid levels on allopurinol for gout; not active now.  Educated on dx of lichen sclerosus. Start tac cream. Stop hot water/baths and stop scratching.   Monitor CKD.   HM - updated influenza vac today Diabetic education: ongoing education regarding chronic disease management for diabetes was given today. We continue to reinforce the ABC's of diabetic management: A1c (<7 or 8 dependent upon patient), tight blood pressure control, and cholesterol management with goal LDL < 100 minimally. We discuss diet strategies, exercise recommendations, medication options and possible side effects. At each visit, we review recommended immunizations and preventive care recommendations for diabetics and stress that good diabetic control can prevent other problems. See below for this patient's data. Hypertension education: ongoing education regarding management of these chronic disease states was given. Management strategies discussed on successive visits include dietary and exercise recommendations, goals of achieving and maintaining IBW, and lifestyle modifications aiming for adequate sleep and minimizing stressors.   Follow up: Return in about 8 weeks (around 12/10/2017) for recheck thyroid and mood..   Commons side effects, risks, benefits, and alternatives for medications and treatment plan prescribed today were discussed, and the patient expressed understanding of the given instructions. Patient is instructed to call or message via MyChart if he/she has any questions or concerns  regarding our treatment plan. No barriers to understanding were identified. We discussed Red Flag symptoms and signs in detail. Patient expressed understanding regarding what to do in case of urgent or emergency type symptoms.   Medication list was reconciled, printed and provided to the patient in AVS. Patient instructions and summary information was reviewed with the patient as documented in the AVS. This note was prepared with assistance of Dragon voice recognition software. Occasional wrong-word or sound-a-like substitutions may have occurred due to the inherent limitations of voice recognition software  Orders Placed This Encounter  Procedures  . Comprehensive metabolic panel  . Lipid panel  . Hemoglobin A1c  . TSH  . Uric acid   Meds ordered this encounter  Medications  . albuterol (PROVENTIL HFA;VENTOLIN HFA) 108 (90 Base) MCG/ACT inhaler    Sig: Inhale 2 puffs into the lungs every 6 (six) hours as needed for wheezing.    Dispense:  1 Inhaler    Refill:  2  . azithromycin (ZITHROMAX) 250 MG tablet    Sig: Take 2 tabs today, then 1 tab daily for 4 days    Dispense:  1 each    Refill:  0  . triamcinolone cream (KENALOG) 0.1 %    Sig: Apply 1 application topically 2 (two) times daily.    Dispense:  30 g    Refill:  0

## 2017-10-16 ENCOUNTER — Other Ambulatory Visit: Payer: Self-pay | Admitting: Emergency Medicine

## 2017-10-16 ENCOUNTER — Telehealth: Payer: Self-pay | Admitting: Family Medicine

## 2017-10-16 ENCOUNTER — Other Ambulatory Visit: Payer: Self-pay | Admitting: *Deleted

## 2017-10-16 DIAGNOSIS — J4520 Mild intermittent asthma, uncomplicated: Secondary | ICD-10-CM

## 2017-10-16 MED ORDER — ALBUTEROL SULFATE HFA 108 (90 BASE) MCG/ACT IN AERS: 2.0000 | INHALATION_SPRAY | Freq: Four times a day (QID) | RESPIRATORY_TRACT | 2 refills | Status: DC | PRN

## 2017-10-16 MED ORDER — AZITHROMYCIN 250 MG PO TABS: ORAL_TABLET | ORAL | 0 refills | Status: DC

## 2017-10-16 NOTE — Telephone Encounter (Signed)
Copied from [place] [id number]. Topic: Quick Communication - See Telephone Encounter >> Oct 16, 2017  4:31 PM Boyd Kerbs wrote: CRM for notification. See Telephone encounter for:   Pt came in yesterday and saw Dr. Jonni Sanger.. She was told by doctor that she was going to call in antibiotic and pharmacy is saying they did not have it..   Please re-fax script to pharmacy. Azithromycin was written on her paper when she left.  [id number] [address], [place] [address] Phone: [phone] Fax: [phone]    10/16/17.

## 2017-10-16 NOTE — Progress Notes (Signed)
Please call patient ( she can't get into her mychart account): I have reviewed his/her lab results. Labs mostly are stable. Thyroid is ok, but would like to recheck in 6-8 weeks as we discussed. She should already have appt set up. I'll monitor her kidney function - stay hydrated. DM is doing fine off medications. Continue a diabetic diet. Cholesterol levels are good. Thanks

## 2017-11-23 ENCOUNTER — Telehealth: Payer: Self-pay | Admitting: Family Medicine

## 2017-11-23 NOTE — Telephone Encounter (Signed)
Copied from [place] [id number]. Topic: Quick Communication - See Telephone Encounter >> Nov 23, 2017  2:53 PM Ivar Drape wrote: CRM for notification. See Telephone encounter for:  11/23/17. Patient would like refills on the following medications and sent to [facility] preferred pharmacy [facility] [place], [place]:  1) allopurinol (ZYLOPRIM) 100 MG tablet  2) Fluoxetine HCl, PMDD, 20 MG CAPS  3) levothyroxine (SYNTHROID, LEVOTHROID) 50 MCG tablet  4) losartan (COZAAR) 50 MG tablet

## 2017-11-24 ENCOUNTER — Other Ambulatory Visit: Payer: Self-pay

## 2017-11-24 DIAGNOSIS — M109 Gout, unspecified: Secondary | ICD-10-CM

## 2017-11-24 DIAGNOSIS — I10 Essential (primary) hypertension: Secondary | ICD-10-CM

## 2017-11-24 DIAGNOSIS — E038 Other specified hypothyroidism: Secondary | ICD-10-CM

## 2017-11-24 DIAGNOSIS — F418 Other specified anxiety disorders: Secondary | ICD-10-CM

## 2017-11-24 MED ORDER — LEVOTHYROXINE SODIUM 50 MCG PO TABS: 50.0000 ug | ORAL_TABLET | Freq: Every day | ORAL | 1 refills | Status: DC

## 2017-11-24 MED ORDER — FLUOXETINE HCL (PMDD) 20 MG PO CAPS: 1.0000 | ORAL_CAPSULE | Freq: Every day | ORAL | 1 refills | Status: DC

## 2017-11-24 MED ORDER — ALLOPURINOL 100 MG PO TABS: 100.0000 mg | ORAL_TABLET | Freq: Every day | ORAL | 1 refills | Status: DC

## 2017-11-24 MED ORDER — LOSARTAN POTASSIUM 50 MG PO TABS: 50.0000 mg | ORAL_TABLET | Freq: Every day | ORAL | 1 refills | Status: DC

## 2017-11-30 ENCOUNTER — Encounter: Payer: Self-pay | Admitting: Family Medicine

## 2017-11-30 ENCOUNTER — Other Ambulatory Visit: Payer: Self-pay

## 2017-11-30 ENCOUNTER — Ambulatory Visit (INDEPENDENT_AMBULATORY_CARE_PROVIDER_SITE_OTHER): Payer: Medicare Other | Admitting: Family Medicine

## 2017-11-30 VITALS — BP 130/82 | HR 70 | Temp 97.9°F | Ht 63.5 in | Wt 146.0 lb

## 2017-11-30 DIAGNOSIS — E039 Hypothyroidism, unspecified: Secondary | ICD-10-CM | POA: Diagnosis not present

## 2017-11-30 DIAGNOSIS — M8580 Other specified disorders of bone density and structure, unspecified site: Secondary | ICD-10-CM

## 2017-11-30 DIAGNOSIS — M7582 Other shoulder lesions, left shoulder: Secondary | ICD-10-CM | POA: Diagnosis not present

## 2017-11-30 DIAGNOSIS — F339 Major depressive disorder, recurrent, unspecified: Secondary | ICD-10-CM

## 2017-11-30 DIAGNOSIS — G4700 Insomnia, unspecified: Secondary | ICD-10-CM

## 2017-11-30 DIAGNOSIS — N904 Leukoplakia of vulva: Secondary | ICD-10-CM | POA: Diagnosis not present

## 2017-11-30 DIAGNOSIS — G4709 Other insomnia: Secondary | ICD-10-CM

## 2017-11-30 LAB — TSH: TSH: 8.38 u[IU]/mL — AB (ref 0.35–4.50)

## 2017-11-30 MED ORDER — TRAZODONE HCL 50 MG PO TABS: 25.0000 mg | ORAL_TABLET | Freq: Every evening | ORAL | 3 refills | Status: DC | PRN

## 2017-11-30 NOTE — Progress Notes (Signed)
Please call patient: I have reviewed his/her lab results. Her thyroid shows undertreatment again. IF she is taking her medication daily, then I need to adjust up the dosage. Please verify, then I will adjust her medication dosage. Thanks.

## 2017-11-30 NOTE — Progress Notes (Signed)
Subjective  CC:  Chief Complaint  Patient presents with  . Follow-up    TSH recheck     HPI: Elizabeth Mcintyre is a 78 y.o. female who presents to the office today to address the problems listed above in the chief complaint. Also for f/u on insomnia, mood, and new left shoulder pain  Hypothyroidism f/u: Elizabeth Mcintyre is a 77 y.o. female who presents for follow up of hypothyroidism. Last TSH was 2.81 on jan 31st. She had just restarted her medications for 2 months prior to that level. Current symptoms: none . Patient denies change in energy level, diarrhea, heat / cold intolerance, nervousness, palpitations and weight changes.She has been compliant with the medication. Due for lab recheck.   Insomnia and depression: doing well but she admits she tends to get irritable at times and just wants to be alone. [place]. She likes to work out in the yard. Her children are worried about her because she lives far away and doesn't really socialize much anymore. She doesn't think it is so much due to depression, but more that she enjoys being alone. She does complain of not being able to sleep well for the last 6 months. Stays up till 3 or 4 in the mornings. Doesn't like to sleep in a bed so will roam the house and sleep on the sofas.   H/o osteopenia by dexa 04/2017, T=-2.4 with elevated FRAX score. On Vit D only. No h/o fractures.   2 week h/o left shoulder pain. Started at back but now c/o pain with lifting arm above head. No radicular sxs. Was lifting heavy yard stuff prior to pain. No h/o arm/shoulder injuries. No weakness. Hasn't taken any medications for pain. No neck pain.  DM and bp are doing fine.   Lichens sclerosis is much improved after steroid cream.   I reviewed the patients updated PMH, FH, and SocHx.    Patient Active Problem List   Diagnosis Date Noted  . Combined hyperlipidemia associated with type 2 diabetes mellitus ([place]) 01/29/2017   Priority: High  . CKD stage 3 secondary to diabetes ([place]) 02/14/2016    Priority: High  . Schatzki's ring     Priority: High  . Type 2 diabetes mellitus with complication ([place]) 83/38/2505    Priority: High  . Acquired hypothyroidism 05/30/2014    Priority: High  . Major depression, recurrent, chronic ([place]) 04/03/2011    Priority: High  . Essential hypertension, benign 11/09/2007    Priority: High  . Gastroesophageal reflux disease with esophagitis 03/06/2015    Priority: Medium  . Gout 06/09/2014    Priority: Medium  . Osteopenia 12/02/2013    Priority: Medium  . Mild intermittent asthma 11/09/2007    Priority: Medium  . Vitamin D deficiency 05/25/2015    Priority: Low  . 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 Carotid artery stenosis 06/13/2016   Current Meds  Medication Sig  . acetaminophen (TYLENOL) 325 MG tablet Take 650 mg by mouth every 6 (six) hours as needed for moderate pain.   Marland Kitchen allopurinol (ZYLOPRIM) 100 MG tablet Take 1 tablet (100 mg total) by mouth daily. Needs office visit for further refills.  Marland Kitchen amLODipine (NORVASC) 10 MG tablet Take 1 tablet (10 mg total) by mouth daily.  . Blood Glucose Monitoring Suppl (ONE TOUCH ULTRA SYSTEM KIT) W/DEVICE KIT 1 kit by Does not apply route once.  . Fluoxetine HCl, PMDD, 20  MG CAPS Take 1 capsule (20 mg total) by mouth daily.  Marland Kitchen glucose blood (ONE TOUCH ULTRA TEST) test strip Use 1 test strip to test blood sugar levels 3 times daily. Dx. E11.9  . levothyroxine (SYNTHROID, LEVOTHROID) 50 MCG tablet Take 1 tablet (50 mcg total) by mouth daily before breakfast. Patient needs office visit prior to anymore refills.  Marland Kitchen losartan (COZAAR) 50 MG tablet Take 1 tablet (50 mg total) by mouth daily.  Glory Rosebush DELICA LANCETS FINE MISC Test twice daily.  . pantoprazole (PROTONIX) 40 MG tablet Take 40 mg by mouth daily.  . rosuvastatin (CRESTOR) 40 MG tablet Take 1 tablet (40 mg total) by mouth daily.  Marland Kitchen triamcinolone  cream (KENALOG) 0.1 % Apply 1 application topically 2 (two) times daily.  . vitamin B-12 (CYANOCOBALAMIN) 1000 MCG tablet Take 1,000 mcg by mouth daily.  . Vitamin D, Cholecalciferol, 400 UNITS CAPS Take 400 Units by mouth daily.  . Wheat Dextrin (BENEFIBER) POWD Take 1 scoop by mouth every other day.     Allergies: Patient is allergic to codeine phosphate. Family History: Patient family history includes Allergies in her daughter; Cancer in her paternal uncle; Diabetes in her paternal grandfather; Fibromyalgia in her daughter; Heart disease in her daughter, maternal grandfather, and mother; Hyperlipidemia in her sister; Osteoarthritis in her mother; Other in her daughter, daughter, and father; Pulmonary fibrosis in her maternal aunt and maternal aunt; Single kidney in her father; Sudden death in her father and paternal grandmother. Social History:  Patient  reports that  has never smoked. she has never used smokeless tobacco. She reports that she does not drink alcohol or use drugs.  Review of Systems: Constitutional: Negative for fever malaise or anorexia Cardiovascular: negative for chest pain Respiratory: negative for SOB or persistent cough Gastrointestinal: negative for abdominal pain  Objective  Vitals: BP 130/82   Pulse 70   Temp 97.9 F (36.6 C)   Ht 5' 3.5" (1.613 m)   Wt 146 lb (66.2 kg)   BMI 25.46 kg/m  General: no acute distress , A&Ox3 HEENT: PEERL, conjunctiva normal, Oropharynx moist,neck is supple Cardiovascular:  RRR without murmur or gallop.  Respiratory:  Good breath sounds bilaterally, CTAB with normal respiratory effort Skin:  Warm, no rashes Neuro: no tremor MSK: left shoulder with posterior ttp, FROM but pain with full abduction and positive impingement and empty can testing .  Assessment  1. Acquired hypothyroidism   2. Secondary insomnia   3. Tendinitis of left rotator cuff   4. Osteopenia, unspecified location   5. Major depression, recurrent,  chronic ([place])   6. Lichen sclerosus et atrophicus of the vulva      Plan   hypothyroidism:  Clinically stable. Recheck levels and adjust meds as needed.   Insomnia: may be mood related. Add trazadone.   RC tendonitis: RICE tx and rest. Return for steroids and xray in next 2-4 weeks if not improved. Demonstrated ROM exercises to start.   Osteopenia: near osteoporosis; rec adding calcium. Consider starting biphosphonates.   Depression - mildly active. Counseling done. Continue prozac, but consider change if mood is worsening.   Continue prn steroid cream for vulvar irritation.   Follow up:  3 months for diabetes and mood check.   Commons side effects, risks, benefits, and alternatives for medications and treatment plan prescribed today were discussed, and the patient expressed understanding of the given instructions. Patient is instructed to call or message via MyChart if he/she has any questions or concerns regarding  our treatment plan. No barriers to understanding were identified. We discussed Red Flag symptoms and signs in detail. Patient expressed understanding regarding what to do in case of urgent or emergency type symptoms.   Medication list was reconciled, printed and provided to the patient in AVS. Patient instructions and summary information was reviewed with the patient as documented in the AVS. This note was prepared with assistance of Dragon voice recognition software. Occasional wrong-word or sound-a-like substitutions may have occurred due to the inherent limitations of voice recognition software  Orders Placed This Encounter  Procedures  . TSH   Meds ordered this encounter  Medications  . traZODone (DESYREL) 50 MG tablet    Sig: Take 0.5-1 tablets (25-50 mg total) by mouth at bedtime as needed for sleep.    Dispense:  30 tablet    Refill:  3

## 2017-11-30 NOTE — Patient Instructions (Signed)
Please return in 3 months for diabetes recheck.  Ice your shoulder and take ibuprofen 2 tablets twice a day with meals for one week. Return if your shoulder does not improve.  If you have any questions or concerns, please don't hesitate to send me a message via MyChart or call the office at [phone]. Thank you for visiting with [place] today! It's our pleasure caring for you.   Calcium Intake Recommendations You can take Caltrate Plus twice a day or get it through your diet or other OTC supplements (Viactiv, OsCal etc)  Calcium is a mineral that affects many functions in the body, including:  Blood clotting.  Blood vessel function.  Nerve impulse conduction.  Hormone secretion.  Muscle contraction.  Bone and teeth functions.  Most of your body's calcium supply is stored in your bones and teeth. When your calcium stores are low, you may be at risk for low bone mass, bone loss, and bone fractures. Consuming enough calcium helps to grow healthy bones and teeth and to prevent breakdown over time. It is very important that you get enough calcium if you are:  A child undergoing rapid growth.  An adolescent girl.  A pre- or post-menopausal woman.  A woman whose menstrual cycle has stopped due to anorexia nervosa or regular intense exercise.  An individual with lactose intolerance or a milk allergy.  A vegetarian.  What is my plan? Try to consume the recommended amount of calcium daily based on your age. Depending on your overall health, your health care provider may recommend increased calcium intake.General daily calcium intake recommendations by age are:  Birth to 6 months: 200 mg.  Infants 7 to 12 months: 260 mg.  Children 1 to 3 years: 700 mg.  Children 4 to 8 years: 1,000 mg.  Children 9 to 13 years: 1,300 mg.  Teens 14 to 18 years: 1,300 mg.  Adults 19 to 50 years: 1,000 mg.  Adult women 51 to 70 years: 1,200 mg.  Adult men 51 to 70 years: 1,000 mg.  Adults  71 years and older: 1,200 mg.  Pregnant and breastfeeding teens: 1,300 mg.  Pregnant and breastfeeding adults: 1,000 mg.  What do I need to know about calcium intake?  In order for the body to absorb calcium, it needs vitamin D. You can get vitamin D through (we recommend getting [phone] units of Vitamin D daily) ? Direct exposure of the skin to sunlight. ? Foods, such as egg yolks, liver, saltwater fish, and fortified milk. ? Supplements.  Consuming too much calcium may cause: ? Constipation. ? Decreased absorption of iron and zinc. ? Kidney stones.  Calcium supplements may interact with certain medicines. Check with your health care provider before starting any calcium supplements.  Try to get most of your calcium from food. What foods can I eat? Grains  Fortified oatmeal. Fortified ready-to-eat cereals. Fortified frozen waffles. Vegetables Turnip greens. Broccoli. Fruits Fortified orange juice. Meats and Other Protein Sources Canned sardines with bones. Canned salmon with bones. Soy beans. Tofu. Baked beans. Almonds. [place] nuts. Sunflower seeds. Dairy Milk. Yogurt. Cheese. Cottage cheese. Beverages Fortified soy milk. Fortified rice milk. Sweets/Desserts Pudding. Ice Cream. Milkshakes. Blackstrap molasses. The items listed above may not be a complete list of recommended foods or beverages. Contact your dietitian for more options. What foods can affect my calcium intake? It may be more difficult for your body to use calcium or calcium may leave your body more quickly if you consume large amounts of:  Sodium.  Protein.  Caffeine.  Alcohol.  This information is not intended to replace advice given to you by your health care provider. Make sure you discuss any questions you have with your health care provider. Document Released: 04/15/2004 Document Revised: 03/21/2016 Document Reviewed: 02/07/2014 Elsevier Interactive Patient Education  2018 [employer].

## 2017-12-03 MED ORDER — LEVOTHYROXINE SODIUM 75 MCG PO TABS
75.0000 ug | ORAL_TABLET | Freq: Every day | ORAL | 3 refills | Status: DC
Start: 2017-12-03 — End: 2019-06-09

## 2017-12-03 NOTE — Telephone Encounter (Signed)
Called patient, Unable to leave message for patient.

## 2017-12-03 NOTE — Telephone Encounter (Signed)
-----   Message from [place] sent at 12/02/2017  1:16 PM EDT ----- Spoke with Patient regarding lab results, informed Patient per Dr Jonni Sanger:  I have reviewed his/her lab results. Her thyroid shows undertreatment again. IF she is taking her medication daily, then I need to adjust up the dosage. Please verify, then I will adjust her medication dosage.  Patient states that she is taking her thyroid medication every day. Please advise on adjusted dose.                                                                           Patient verbalized understanding  Doloris Hall, LPN

## 2017-12-03 NOTE — Telephone Encounter (Signed)
Patient informed.    Appt scheduled for 01/25/18.   Doloris Hall,  LPN

## 2017-12-03 NOTE — Telephone Encounter (Signed)
Lab Results  Component Value Date   TSH 8.38 (H) 11/30/2017   Pt reports taking 22mcg daily.  Increase to 70mcg and recheck in 8 weeks.

## 2017-12-03 NOTE — Telephone Encounter (Signed)
Please notify pt - increased medication to 27mcg daily. Stop the 42mcg; needs ov to recheck in 6-8 weeks.

## 2018-01-25 ENCOUNTER — Encounter: Payer: Self-pay | Admitting: Family Medicine

## 2018-01-25 ENCOUNTER — Other Ambulatory Visit: Payer: Self-pay

## 2018-01-25 ENCOUNTER — Ambulatory Visit (INDEPENDENT_AMBULATORY_CARE_PROVIDER_SITE_OTHER): Payer: Medicare Other | Admitting: Family Medicine

## 2018-01-25 VITALS — BP 138/78 | HR 84 | Temp 98.1°F | Ht 63.5 in | Wt 147.4 lb

## 2018-01-25 DIAGNOSIS — M7582 Other shoulder lesions, left shoulder: Secondary | ICD-10-CM | POA: Diagnosis not present

## 2018-01-25 DIAGNOSIS — I10 Essential (primary) hypertension: Secondary | ICD-10-CM

## 2018-01-25 DIAGNOSIS — E1122 Type 2 diabetes mellitus with diabetic chronic kidney disease: Secondary | ICD-10-CM

## 2018-01-25 DIAGNOSIS — N904 Leukoplakia of vulva: Secondary | ICD-10-CM | POA: Diagnosis not present

## 2018-01-25 DIAGNOSIS — E039 Hypothyroidism, unspecified: Secondary | ICD-10-CM

## 2018-01-25 DIAGNOSIS — E118 Type 2 diabetes mellitus with unspecified complications: Secondary | ICD-10-CM | POA: Diagnosis not present

## 2018-01-25 DIAGNOSIS — N183 Chronic kidney disease, stage 3 unspecified: Secondary | ICD-10-CM

## 2018-01-25 DIAGNOSIS — F339 Major depressive disorder, recurrent, unspecified: Secondary | ICD-10-CM | POA: Diagnosis not present

## 2018-01-25 LAB — TSH: TSH: 0.73 u[IU]/mL (ref 0.35–4.50)

## 2018-01-25 LAB — BASIC METABOLIC PANEL
BUN: 22 mg/dL (ref 6–23)
CALCIUM: 9.5 mg/dL (ref 8.4–10.5)
CO2: 26 meq/L (ref 19–32)
CREATININE: 1.53 mg/dL — AB (ref 0.40–1.20)
Chloride: 103 mEq/L (ref 96–112)
GFR: 35.1 mL/min — ABNORMAL LOW (ref >60.00)
GLUCOSE: 123 mg/dL — AB (ref 70–99)
Potassium: 3.9 mEq/L (ref 3.5–5.1)
Sodium: 138 mEq/L (ref 135–145)

## 2018-01-25 LAB — POCT GLYCOSYLATED HEMOGLOBIN (HGB A1C): HEMOGLOBIN A1C: 6

## 2018-01-25 MED ORDER — TRIAMCINOLONE ACETONIDE 0.1 % EX CREA: 1.0000 "application " | TOPICAL_CREAM | Freq: Two times a day (BID) | CUTANEOUS | 2 refills | Status: DC

## 2018-01-25 MED ORDER — AMLODIPINE BESYLATE 10 MG PO TABS: 10.0000 mg | ORAL_TABLET | Freq: Every day | ORAL | 3 refills | Status: DC

## 2018-01-25 NOTE — Addendum Note (Signed)
Addended by: Katina Dung on: 01/25/2018 11:31 AM   Modules accepted: Orders

## 2018-01-25 NOTE — Progress Notes (Signed)
Subjective  CC:  Chief Complaint  Patient presents with  . Hypothyroidism    Patient doing well on 75 mcg dose.   . Diabetes    last a1c in 09/2017  . Shoulder Pain    left; nsaids and exercises have helped but still interminttently painful  . Hypertension    with ckd. due for repeat labs  . Depression    haninng in there. no worse, no better: stable    HPI: Elizabeth Mcintyre is a 77 y.o. female who presents to the office today for follow up of diabetes and problems listed above in the chief complaint.   Diabetes follow up: Her diabetic control is reported as Improved. Doing great.  She denies exertional CP or SOB or symptomatic hypoglycemia. She denies foot sores or paresthesias.   Hypothyroidism: we increased dose of meds from 50-60mcg daily. Feels the same although now with some hot flushes. No palpiations, unwanted weight changes, diarrhea, cp. Compliant with meds. Due for recheck.   Depression: remains just a little down; "feels overwhelmed" but handling things fine. Has a lot to do. No SI or anhedonia. Tolerates prozac and thinks it helps.   Left shoulder pain is mildly better. Conservative measures improved it but still with significant pain with overhead use intermittently. No neck pain or radicular pain. No weakness in the extremity.   BP is doing well but out of amlodipine x 1 weeks. ckd due for recheck. No LE edema.   Depression screen [facility] 2/9 01/25/2018 10/15/2017 03/13/2017  Decreased Interest 1 1 0  Down, Depressed, Hopeless 1 1 0  PHQ - 2 Score 2 2 0  Altered sleeping 1 - -  Tired, decreased energy 1 - -  Change in appetite 1 - -  Feeling bad or failure about yourself  1 - -  Trouble concentrating 0 - -  Moving slowly or fidgety/restless 0 - -  Suicidal thoughts 0 - -  PHQ-9 Score 6 - -  Difficult doing work/chores Somewhat difficult - -   Immunization History  Administered Date(s) Administered  . Influenza Split 07/16/2011  . Influenza, High Dose Seasonal  PF 10/15/2017  . Influenza,inj,Quad PF,6+ Mos 06/09/2014, 10/15/2015  . Pneumococcal Conjugate-13 03/13/2017  . Pneumococcal Polysaccharide-23 10/27/2014  . Tdap 07/16/2011    Diabetes Related Lab Review: Lab Results  Component Value Date   HGBA1C 6.0 01/25/2018   HGBA1C 6.3 10/15/2017   HGBA1C 5.6 01/29/2017    Lab Results  Component Value Date   MICROALBUR 2.2 (H) 04/07/2014   Lab Results  Component Value Date   CREATININE 1.69 (H) 10/15/2017   BUN 22 10/15/2017   NA 139 10/15/2017   K 4.2 10/15/2017   CL 102 10/15/2017   CO2 27 10/15/2017   Lab Results  Component Value Date   CHOL 157 10/15/2017   CHOL 187 01/29/2017   CHOL 161 02/23/2015   Lab Results  Component Value Date   HDL 51.80 10/15/2017   HDL 53.00 01/29/2017   HDL 46.90 02/23/2015   Lab Results  Component Value Date   LDLCALC 176 (H) 11/02/2014   LDLCALC 27 09/01/2013   LDLCALC 31 07/11/2011   Lab Results  Component Value Date   TRIG 255.0 (H) 10/15/2017   TRIG 345.0 (H) 01/29/2017   TRIG 243.0 (H) 02/23/2015   Lab Results  Component Value Date   CHOLHDL 3 10/15/2017   CHOLHDL 4 01/29/2017   CHOLHDL 3 02/23/2015   Lab Results  Component Value Date  LDLDIRECT 67.0 10/15/2017   LDLDIRECT 76.0 01/29/2017   LDLDIRECT 75.0 02/23/2015   The 10-year ASCVD risk score Mikey Bussing DC Jr., et al., 2013) is: 40.6%   Values used to calculate the score:     Age: 40 years     Sex: Female     Is Non-Hispanic African American: No     Diabetic: Yes     Tobacco smoker: No     Systolic Blood Pressure: 193 mmHg     Is BP treated: Yes     HDL Cholesterol: 51.8 mg/dL     Total Cholesterol: 157 mg/dL I have reviewed the PMH, Fam and Soc history. Patient Active Problem List   Diagnosis Date Noted  . Combined hyperlipidemia associated with type 2 diabetes mellitus ([place]) 01/29/2017    Priority: High  . CKD stage 3 secondary to diabetes ([place]) 02/14/2016    Priority: High    GFR 36--> monitor 3 mos.    .  Schatzki's ring     Priority: High  . Type 2 diabetes mellitus with complication ([place]) 79/10/4095    Priority: High  . Acquired hypothyroidism 05/30/2014    Priority: High  . Major depression, recurrent, chronic ([place]) 04/03/2011    Priority: High    Managed with prozac for years; started after death of husband.    . Essential hypertension, benign 11/09/2007    Priority: High    Current med losartan 100 mg qd, amlodopine 10 mg qd   . Gastroesophageal reflux disease with esophagitis 03/06/2015    Priority: Medium  . Gout 06/09/2014    Priority: Medium    R great toe, ball of foot Stopped HCTZ several mos ago,  On losartan        . Osteopenia 12/02/2013    Priority: Medium    Dexa scan August/2018: -2.4    . Mild intermittent asthma 11/09/2007    Priority: Medium    Qualifier: Diagnosis of  By: Burnice Logan  MD, Doretha Sou Notes 2 maternal aunts and maternal grandfather died from pulmonary fibrosis from unknown causes.    . Vitamin D deficiency 05/25/2015    Priority: Low  . 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 Carotid artery stenosis 06/13/2016    Social History: Patient  reports that she has never smoked. She has never used smokeless tobacco. She reports that she does not drink alcohol or use drugs.  Review of Systems: Ophthalmic: negative for eye pain, loss of vision or double vision Cardiovascular: negative for chest pain Respiratory: negative for SOB or persistent cough Gastrointestinal: negative for abdominal pain Genitourinary: negative for dysuria or gross hematuria MSK: negative for foot lesions Neurologic: negative for weakness or gait disturbance  Wt Readings from Last 3 Encounters:  01/25/18 147 lb 6.4 oz (66.9 kg)  11/30/17 146 lb (66.2 kg)  10/15/17 146 lb (66.2 kg)    Objective  Vitals: BP 138/78   Pulse 84   Temp 98.1 F (36.7 C)   Ht 5' 3.5" (1.613 m)   Wt 147 lb 6.4 oz (66.9 kg)   BMI 25.70 kg/m  General: well  appearing, no acute distress  Psych:  Alert and oriented, normal mood and affect HEENT:  Normocephalic, atraumatic, moist mucous membranes, supple neck  Cardiovascular:  Nl S1 and S2, RRR without murmur, gallop or rub. no edema Respiratory:  Good breath sounds bilaterally, CTAB with normal effort, no rales Gastrointestinal: normal BS, soft, nontender Skin:  Warm, no rashes Neurologic:   Mental status  is normal. normal gait Foot exam: no erythema, pallor, or cyanosis visible nl proprioception and sensation to monofilament testing bilaterally, +2 distal pulses bilaterally Left shoulder: pain with abduction > 90; full PROM but painful. + empty can sign. Nl grip and nl elbow.   Shoulder Injection Procedure Note  Pre-operative Diagnosis: left shouder RCT  Post-operative Diagnosis: same  Indications: Symptomatic relief and failed conservative measures  Anesthesia: cold spray  Procedure Details   Verbal consent was obtained for the procedure. The shoulder was prepped with alcohol  and the skin was anesthetized with cold spray. Using a 22 gauge needle the subacromial space was injected with 4 mL 1% lidocaine and 1 mL of triamcinolone (KENALOG) 40mg /ml under the posterior aspect of the acromion. The injection site was cleansed with topical isopropyl alcohol and a dressing was applied.  Complications:  None; patient tolerated the procedure well.   Assessment  1. Acquired hypothyroidism   2. Type 2 diabetes mellitus with complication, without long-term current use of insulin ([place])   3. Major depression, recurrent, chronic ([place])   4. Essential hypertension, benign   5. Lichen sclerosus et atrophicus of the vulva   6. CKD stage 3 secondary to diabetes ([place])   7. Rotator cuff tendonitis, left      Plan   Diabetes is currently very well controlled. Diet controlled. Check urine today if she can give a sample.   HTN is marginally controlled but has been out of meds. Refilled today. Recheck 3  months. Check renal function and electrolytes.  Hypothyroidism: recheck on higher dose. May have overshot given sxs of hot flushes.   RCT: Routine post procedure care instructions were given to patient in detail. Start rom exercises as outlined in avs.   Depression: mildly worsening but situational. No indication to adjust meds at this time. Continue prozac and monitor; recheck at [place] due in June.  Diabetic education: ongoing education regarding chronic disease management for diabetes was given today. We continue to reinforce the ABC's of diabetic management: A1c (<7 or 8 dependent upon patient), tight blood pressure control, and cholesterol management with goal LDL < 100 minimally. We discuss diet strategies, exercise recommendations, medication options and possible side effects. At each visit, we review recommended immunizations and preventive care recommendations for diabetics and stress that good diabetic control can prevent other problems. See below for this patient's data.  Follow up: Return in about 3 months (around 04/27/2018) for complete physical, AWV after june..   Commons side effects, risks, benefits, and alternatives for medications and treatment plan prescribed today were discussed, and the patient expressed understanding of the given instructions. Patient is instructed to call or message via MyChart if he/she has any questions or concerns regarding our treatment plan. No barriers to understanding were identified. We discussed Red Flag symptoms and signs in detail. Patient expressed understanding regarding what to do in case of urgent or emergency type symptoms.   Medication list was reconciled, printed and provided to the patient in AVS. Patient instructions and summary information was reviewed with the patient as documented in the AVS. This note was prepared with assistance of Dragon voice recognition software. Occasional wrong-word or sound-a-like substitutions may have occurred due  to the inherent limitations of voice recognition software  Orders Placed This Encounter  Procedures  . TSH  . Microalbumin / creatinine urine ratio  . Basic metabolic panel  . POCT glycosylated hemoglobin (Hb A1C)   Meds ordered this encounter  Medications  . triamcinolone cream (KENALOG)  0.1 %    Sig: Apply 1 application topically 2 (two) times daily. For 2-4 weeks as needed    Dispense:  45 g    Refill:  2  . amLODipine (NORVASC) 10 MG tablet    Sig: Take 1 tablet (10 mg total) by mouth daily.    Dispense:  90 tablet    Refill:  3

## 2018-01-25 NOTE — Patient Instructions (Addendum)
Shoulder Exercises Ask your health care provider which exercises are safe for you. Do exercises exactly as told by your health care provider and adjust them as directed. It is normal to feel mild stretching, pulling, tightness, or discomfort as you do these exercises, but you should stop right away if you feel sudden pain or your pain gets worse.Do not begin these exercises until told by your health care provider. RANGE OF MOTION EXERCISES These exercises warm up your muscles and joints and improve the movement and flexibility of your shoulder. These exercises also help to relieve pain, numbness, and tingling. These exercises involve stretching your injured shoulder directly. Exercise A: Pendulum  1. Stand near a wall or a surface that you can hold onto for balance. 2. Bend at the waist and let your left / right arm hang straight down. Use your other arm to support you. Keep your back straight and do not lock your knees. 3. Relax your left / right arm and shoulder muscles, and move your hips and your trunk so your left / right arm swings freely. Your arm should swing because of the motion of your body, not because you are using your arm or shoulder muscles. 4. Keep moving your body so your arm swings in the following directions, as told by your health care provider: ? Side to side. ? Forward and backward. ? In clockwise and counterclockwise circles. 5. Continue each motion for __________ seconds, or for as long as told by your health care provider. 6. Slowly return to the starting position. Repeat __________ times. Complete this exercise __________ times a day. Exercise B:Flexion, Standing  1. Stand and hold a broomstick, a cane, or a similar object. Place your hands a little more than shoulder-width apart on the object. Your left / right hand should be palm-up, and your other hand should be palm-down. 2. Keep your elbow straight and keep your shoulder muscles relaxed. Push the stick down with  your healthy arm to raise your left / right arm in front of your body, and then over your head until you feel a stretch in your shoulder. ? Avoid shrugging your shoulder while you raise your arm. Keep your shoulder blade tucked down toward the middle of your back. 3. Hold for __________ seconds. 4. Slowly return to the starting position. Repeat __________ times. Complete this exercise __________ times a day. Exercise C: Abduction, Standing 1. Stand and hold a broomstick, a cane, or a similar object. Place your hands a little more than shoulder-width apart on the object. Your left / right hand should be palm-up, and your other hand should be palm-down. 2. While keeping your elbow straight and your shoulder muscles relaxed, push the stick across your body toward your left / right side. Raise your left / right arm to the side of your body and then over your head until you feel a stretch in your shoulder. ? Do not raise your arm above shoulder height, unless your health care provider tells you to do that. ? Avoid shrugging your shoulder while you raise your arm. Keep your shoulder blade tucked down toward the middle of your back. 3. Hold for __________ seconds. 4. Slowly return to the starting position. Repeat __________ times. Complete this exercise __________ times a day. Exercise D:Internal Rotation  1. Place your left / right hand behind your back, palm-up. 2. Use your other hand to dangle an exercise band, a towel, or a similar object over your shoulder. Grasp the band with   your left / right hand so you are holding onto both ends. 3. Gently pull up on the band until you feel a stretch in the front of your left / right shoulder. ? Avoid shrugging your shoulder while you raise your arm. Keep your shoulder blade tucked down toward the middle of your back. 4. Hold for __________ seconds. 5. Release the stretch by letting go of the band and lowering your hands. Repeat __________ times. Complete  this exercise __________ times a day. STRETCHING EXERCISES These exercises warm up your muscles and joints and improve the movement and flexibility of your shoulder. These exercises also help to relieve pain, numbness, and tingling. These exercises are done using your healthy shoulder to help stretch the muscles of your injured shoulder. Exercise E: Corner Stretch (External Rotation and Abduction)  1. Stand in a doorway with one of your feet slightly in front of the other. This is called a staggered stance. If you cannot reach your forearms to the door frame, stand facing a corner of a room. 2. Choose one of the following positions as told by your health care provider: ? Place your hands and forearms on the door frame above your head. ? Place your hands and forearms on the door frame at the height of your head. ? Place your hands on the door frame at the height of your elbows. 3. Slowly move your weight onto your front foot until you feel a stretch across your chest and in the front of your shoulders. Keep your head and chest upright and keep your abdominal muscles tight. 4. Hold for __________ seconds. 5. To release the stretch, shift your weight to your back foot. Repeat __________ times. Complete this stretch __________ times a day. Exercise F:Extension, Standing 1. Stand and hold a broomstick, a cane, or a similar object behind your back. ? Your hands should be a little wider than shoulder-width apart. ? Your palms should face away from your back. 2. Keeping your elbows straight and keeping your shoulder muscles relaxed, move the stick away from your body until you feel a stretch in your shoulder. ? Avoid shrugging your shoulders while you move the stick. Keep your shoulder blade tucked down toward the middle of your back. 3. Hold for __________ seconds. 4. Slowly return to the starting position. Repeat __________ times. Complete this exercise __________ times a day. STRENGTHENING  EXERCISES These exercises build strength and endurance in your shoulder. Endurance is the ability to use your muscles for a long time, even after they get tired. Exercise G:External Rotation  1. Sit in a stable chair without armrests. 2. Secure an exercise band at elbow height on your left / right side. 3. Place a soft object, such as a folded towel or a small pillow, between your left / right upper arm and your body to move your elbow a few inches away (about 10 cm) from your side. 4. Hold the end of the band so it is tight and there is no slack. 5. Keeping your elbow pressed against the soft object, move your left / right forearm out, away from your abdomen. Keep your body steady so only your forearm moves. 6. Hold for __________ seconds. 7. Slowly return to the starting position. Repeat __________ times. Complete this exercise __________ times a day. Exercise H:Shoulder Abduction  1. Sit in a stable chair without armrests, or stand. 2. Hold a __________ weight in your left / right hand, or hold an exercise band with both hands.   3. Start with your arms straight down and your left / right palm facing in, toward your body. 4. Slowly lift your left / right hand out to your side. Do not lift your hand above shoulder height unless your health care provider tells you that this is safe. ? Keep your arms straight. ? Avoid shrugging your shoulder while you do this movement. Keep your shoulder blade tucked down toward the middle of your back. 5. Hold for __________ seconds. 6. Slowly lower your arm, and return to the starting position. Repeat __________ times. Complete this exercise __________ times a day. Exercise I:Shoulder Extension 1. Sit in a stable chair without armrests, or stand. 2. Secure an exercise band to a stable object in front of you where it is at shoulder height. 3. Hold one end of the exercise band in each hand. Your palms should face each other. 4. Straighten your elbows and  lift your hands up to shoulder height. 5. Step back, away from the secured end of the exercise band, until the band is tight and there is no slack. 6. Squeeze your shoulder blades together as you pull your hands down to the sides of your thighs. Stop when your hands are straight down by your sides. Do not let your hands go behind your body. 7. Hold for __________ seconds. 8. Slowly return to the starting position. Repeat __________ times. Complete this exercise __________ times a day. Exercise J:Standing Shoulder Row 1. Sit in a stable chair without armrests, or stand. 2. Secure an exercise band to a stable object in front of you so it is at waist height. 3. Hold one end of the exercise band in each hand. Your palms should be in a thumbs-up position. 4. Bend each of your elbows to an "L" shape (about 90 degrees) and keep your upper arms at your sides. 5. Step back until the band is tight and there is no slack. 6. Slowly pull your elbows back behind you. 7. Hold for __________ seconds. 8. Slowly return to the starting position. Repeat __________ times. Complete this exercise __________ times a day. Exercise K:Shoulder Press-Ups  1. Sit in a stable chair that has armrests. Sit upright, with your feet flat on the floor. 2. Put your hands on the armrests so your elbows are bent and your fingers are pointing forward. Your hands should be about even with the sides of your body. 3. Push down on the armrests and use your arms to lift yourself off of the chair. Straighten your elbows and lift yourself up as much as you comfortably can. ? Move your shoulder blades down, and avoid letting your shoulders move up toward your ears. ? Keep your feet on the ground. As you get stronger, your feet should support less of your body weight as you lift yourself up. 4. Hold for __________ seconds. 5. Slowly lower yourself back into the chair. Repeat __________ times. Complete this exercise __________ times a  day. Exercise L: Wall Push-Ups  1. Stand so you are facing a stable wall. Your feet should be about one arm-length away from the wall. 2. Lean forward and place your palms on the wall at shoulder height. 3. Keep your feet flat on the floor as you bend your elbows and lean forward toward the wall. 4. Hold for __________ seconds. 5. Straighten your elbows to push yourself back to the starting position. Repeat __________ times. Complete this exercise __________ times a day. This information is not intended to replace advice   given to you by your health care provider. Make sure you discuss any questions you have with your health care provider. Document Released: 07/16/2005 Document Revised: 05/26/2016 Document Reviewed: 05/13/2015 Elsevier Interactive Patient Education  [employer]. Please return in 3 months for your complete physical.  Your AWV is due in June. Please schedule at your convenience.  Medicare recommends an Annual Wellness Visit for all patients. Please schedule this to be done with our Nurse Educator, Maudie Mercury. This is an informative "talk" visit; it's goals are to ensure that your health care needs are being met and to give you education regarding avoiding falls, ensuring you are not suffering from depression or problems with memory or thinking, and to educate you on Advance Care Planning. It helps me take good care of you!   If you have any questions or concerns, please don't hesitate to send me a message via MyChart or call the office at [phone]. Thank you for visiting with [place] today! It's our pleasure caring for you.

## 2018-01-28 DIAGNOSIS — M7582 Other shoulder lesions, left shoulder: Secondary | ICD-10-CM | POA: Diagnosis not present

## 2018-01-28 DIAGNOSIS — I10 Essential (primary) hypertension: Secondary | ICD-10-CM | POA: Diagnosis not present

## 2018-01-28 DIAGNOSIS — E118 Type 2 diabetes mellitus with unspecified complications: Secondary | ICD-10-CM | POA: Diagnosis not present

## 2018-01-28 MED ORDER — TRIAMCINOLONE ACETONIDE 40 MG/ML IJ SUSP
40.0000 mg | Freq: Once | INTRAMUSCULAR | Status: AC
Start: 2018-01-28 — End: 2018-01-28
  Administered 2018-01-28: 40 mg via INTRA_ARTICULAR

## 2018-01-28 NOTE — Addendum Note (Signed)
Addended bySigurd Sos on: 01/28/2018 03:20 PM   Modules accepted: Orders

## 2018-02-16 ENCOUNTER — Other Ambulatory Visit: Payer: Self-pay | Admitting: Family Medicine

## 2018-02-16 DIAGNOSIS — F418 Other specified anxiety disorders: Secondary | ICD-10-CM

## 2018-02-16 NOTE — Telephone Encounter (Signed)
Last OV: 01/25/2018 Last Fill: 11/24/2017

## 2018-07-03 ENCOUNTER — Other Ambulatory Visit: Payer: Self-pay | Admitting: Family Medicine

## 2018-07-03 DIAGNOSIS — E7849 Other hyperlipidemia: Secondary | ICD-10-CM

## 2018-07-09 ENCOUNTER — Other Ambulatory Visit: Payer: Self-pay

## 2018-07-09 NOTE — Patient Outreach (Signed)
[facility]) Care Management  07/09/2018  Elizabeth Mcintyre May 06, 1941 [id number]   Medication Adherence call to Elizabeth Mcintyre patient did not answer patient is due on Losartan 50 mg. Elizabeth Mcintyre is showing past due under [place].   [place] Management Direct Dial [phone]  Fax [phone] [email or web address]

## 2018-07-20 ENCOUNTER — Other Ambulatory Visit: Payer: Self-pay

## 2018-07-20 NOTE — Patient Outreach (Signed)
[facility]) Care Management  07/20/2018  Elizabeth Mcintyre 1940-11-03 [id number]   Medication Adherence call to [name] left a message for patient to call back patient is due on Losartan 50 mg.[name] is showing past due under [place].   [place] Management Direct Dial [phone]  Fax [phone] [email or web address]

## 2018-08-05 ENCOUNTER — Other Ambulatory Visit: Payer: Self-pay

## 2018-08-05 ENCOUNTER — Emergency Department (HOSPITAL_COMMUNITY): Payer: Medicare Other

## 2018-08-05 ENCOUNTER — Emergency Department (HOSPITAL_COMMUNITY)
Admission: EM | Admit: 2018-08-05 | Discharge: 2018-08-05 | Disposition: A | Discharge status: Home / Self Care | Payer: Medicare Other | Attending: Emergency Medicine | Admitting: Emergency Medicine

## 2018-08-05 ENCOUNTER — Encounter (HOSPITAL_COMMUNITY): Payer: Self-pay | Admitting: Emergency Medicine

## 2018-08-05 DIAGNOSIS — Z79899 Other long term (current) drug therapy: Secondary | ICD-10-CM | POA: Diagnosis not present

## 2018-08-05 DIAGNOSIS — I129 Hypertensive chronic kidney disease with stage 1 through stage 4 chronic kidney disease, or unspecified chronic kidney disease: Secondary | ICD-10-CM | POA: Diagnosis not present

## 2018-08-05 DIAGNOSIS — J45909 Unspecified asthma, uncomplicated: Secondary | ICD-10-CM | POA: Insufficient documentation

## 2018-08-05 DIAGNOSIS — R0789 Other chest pain: Secondary | ICD-10-CM | POA: Diagnosis not present

## 2018-08-05 DIAGNOSIS — E876 Hypokalemia: Secondary | ICD-10-CM | POA: Diagnosis not present

## 2018-08-05 DIAGNOSIS — E119 Type 2 diabetes mellitus without complications: Secondary | ICD-10-CM | POA: Diagnosis not present

## 2018-08-05 DIAGNOSIS — R197 Diarrhea, unspecified: Secondary | ICD-10-CM

## 2018-08-05 DIAGNOSIS — R079 Chest pain, unspecified: Secondary | ICD-10-CM | POA: Diagnosis not present

## 2018-08-05 DIAGNOSIS — E860 Dehydration: Secondary | ICD-10-CM

## 2018-08-05 DIAGNOSIS — N183 Chronic kidney disease, stage 3 (moderate): Secondary | ICD-10-CM | POA: Insufficient documentation

## 2018-08-05 LAB — COMPREHENSIVE METABOLIC PANEL
ALBUMIN: 4.7 g/dL (ref 3.5–5.0)
ALT: 22 U/L (ref 0–44)
ANION GAP: 11 (ref 5–15)
AST: 28 U/L (ref 15–41)
Alkaline Phosphatase: 65 U/L (ref 38–126)
BILIRUBIN TOTAL: 0.5 mg/dL (ref 0.3–1.2)
BUN: 20 mg/dL (ref 8–23)
CO2: 24 mmol/L (ref 22–32)
Calcium: 10 mg/dL (ref 8.9–10.3)
Chloride: 104 mmol/L (ref 98–111)
Creatinine, Ser: 1.42 mg/dL — ABNORMAL HIGH (ref 0.44–1.00)
GFR calc Af Amer: 40 mL/min — ABNORMAL LOW (ref >60)
GFR, EST NON AFRICAN AMERICAN: 35 mL/min — AB (ref >60)
GLUCOSE: 114 mg/dL — AB (ref 70–99)
POTASSIUM: 3 mmol/L — AB (ref 3.5–5.1)
Sodium: 139 mmol/L (ref 135–145)
TOTAL PROTEIN: 7.8 g/dL (ref 6.5–8.1)

## 2018-08-05 LAB — C DIFFICILE QUICK SCREEN W PCR REFLEX
C DIFFICILE (CDIFF) TOXIN: NEGATIVE
C DIFFICLE (CDIFF) ANTIGEN: NEGATIVE
C Diff interpretation: NOT DETECTED

## 2018-08-05 LAB — URINALYSIS, ROUTINE W REFLEX MICROSCOPIC
BACTERIA UA: NONE SEEN
Bilirubin Urine: NEGATIVE
GLUCOSE, UA: NEGATIVE mg/dL
Ketones, ur: NEGATIVE mg/dL
Leukocytes, UA: NEGATIVE
NITRITE: NEGATIVE
PROTEIN: NEGATIVE mg/dL
Specific Gravity, Urine: 1.005 (ref 1.005–1.030)
pH: 7 (ref 5.0–8.0)

## 2018-08-05 LAB — LIPASE, BLOOD: Lipase: 52 U/L — ABNORMAL HIGH (ref 11–51)

## 2018-08-05 LAB — CBC WITH DIFFERENTIAL/PLATELET
Abs Immature Granulocytes: 0.03 10*3/uL (ref 0.00–0.07)
BASOS PCT: 1 %
Basophils Absolute: 0.1 10*3/uL (ref 0.0–0.1)
EOS ABS: 0.4 10*3/uL (ref 0.0–0.5)
Eosinophils Relative: 6 %
HCT: 42.8 % (ref 36.0–46.0)
Hemoglobin: 14 g/dL (ref 12.0–15.0)
IMMATURE GRANULOCYTES: 0 %
Lymphocytes Relative: 34 %
Lymphs Abs: 2.5 10*3/uL (ref 0.7–4.0)
MCH: 27.6 pg (ref 26.0–34.0)
MCHC: 32.7 g/dL (ref 30.0–36.0)
MCV: 84.3 fL (ref 80.0–100.0)
Monocytes Absolute: 0.7 10*3/uL (ref 0.1–1.0)
Monocytes Relative: 9 %
NEUTROS PCT: 50 %
NRBC: 0 % (ref 0.0–0.2)
Neutro Abs: 3.7 10*3/uL (ref 1.7–7.7)
PLATELETS: 187 10*3/uL (ref 150–400)
RBC: 5.08 MIL/uL (ref 3.87–5.11)
RDW: 13.3 % (ref 11.5–15.5)
WBC: 7.5 10*3/uL (ref 4.0–10.5)

## 2018-08-05 LAB — MAGNESIUM: Magnesium: 1.7 mg/dL (ref 1.7–2.4)

## 2018-08-05 LAB — TROPONIN I

## 2018-08-05 LAB — TSH: TSH: 2.356 u[IU]/mL (ref 0.350–4.500)

## 2018-08-05 IMAGING — DX DG CHEST 2V
2 series · 2 of 2 positions shown · non-contrast
Comparison: Chest radiographs [DATE].

CLINICAL DATA: 77-year-old female with chest pain radiating to the
left arm for several days and progressive.

EXAM:
CHEST - 2 VIEW

[chest pa]
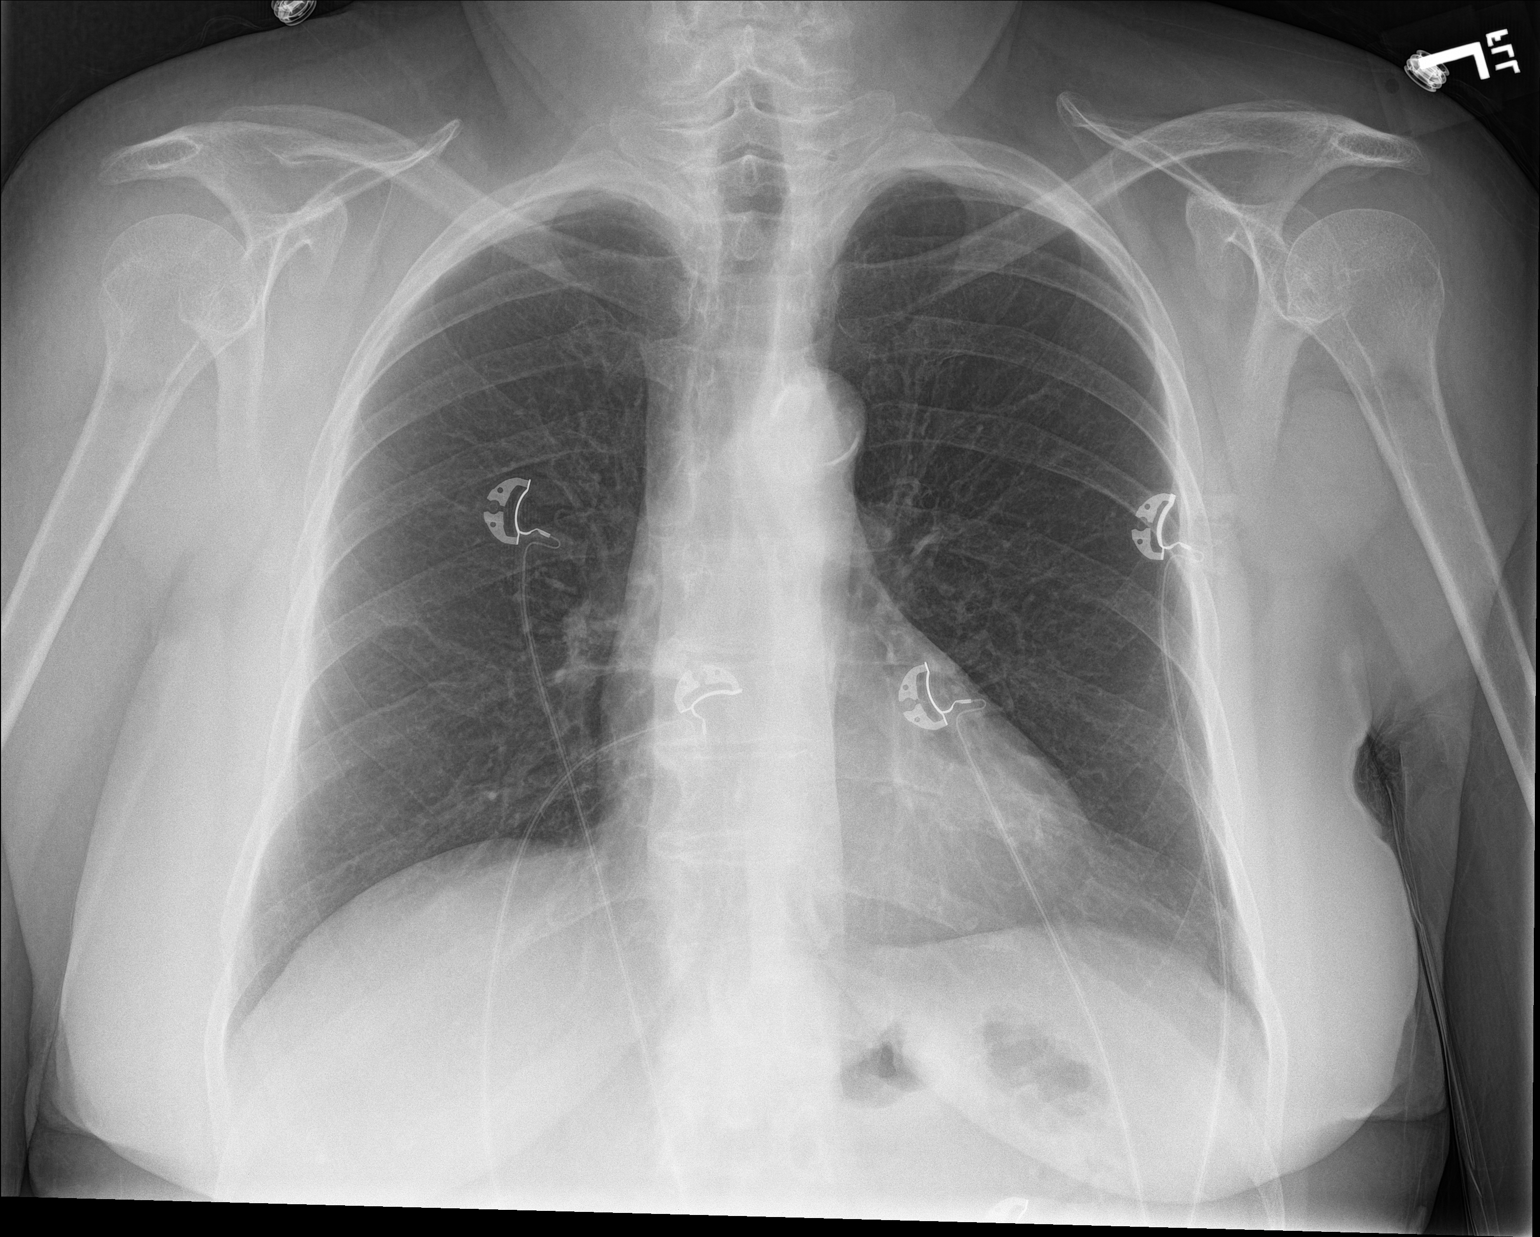

[chest lat]
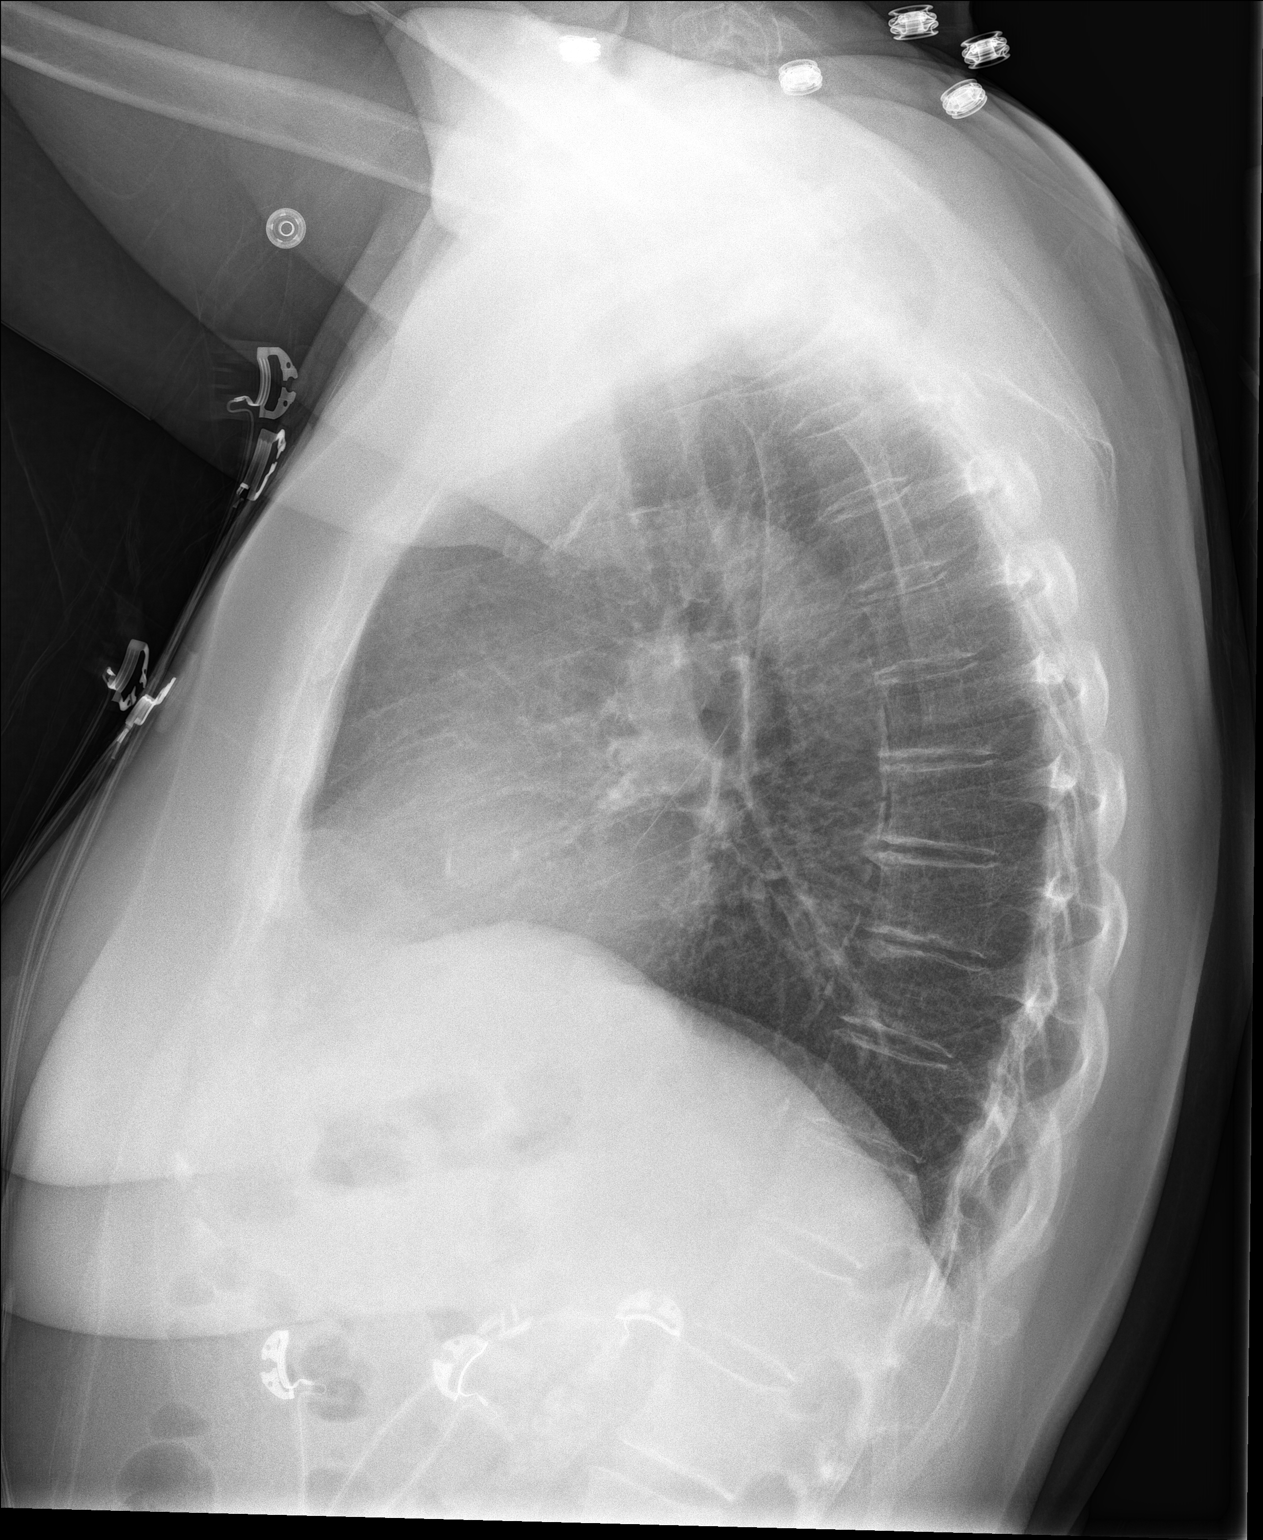

[2 of 2 positions shown; findings below may reference images not displayed]

FINDINGS: The heart size and mediastinal contours are within normal limits.
Calcified aortic atherosclerosis. Both lungs are clear. No
pneumothorax or pleural effusion. No acute osseous abnormality
identified. Negative visible bowel gas pattern.
IMPRESSION: 1. No cardiopulmonary abnormality.
2. Aortic Atherosclerosis ([DU]-[DU]).

## 2018-08-05 MED ORDER — POTASSIUM CHLORIDE CRYS ER 20 MEQ PO TBCR: 20.0000 meq | EXTENDED_RELEASE_TABLET | Freq: Every day | ORAL | 0 refills | Status: DC

## 2018-08-05 MED ORDER — SODIUM CHLORIDE 0.9 % IV BOLUS
500.0000 mL | Freq: Once | INTRAVENOUS | Status: AC
  Administered 2018-08-05: 500 mL via INTRAVENOUS

## 2018-08-05 MED ORDER — POTASSIUM CHLORIDE 10 MEQ/100ML IV SOLN
10.0000 meq | Freq: Once | INTRAVENOUS | Status: AC
  Administered 2018-08-05: 10 meq via INTRAVENOUS
  Filled 2018-08-05: qty 100

## 2018-08-05 NOTE — ED Notes (Signed)
EDP in room  

## 2018-08-05 NOTE — ED Provider Notes (Signed)
[facility] EMERGENCY DEPARTMENT Provider Note   CSN: [id number] Arrival date & time: 08/05/18  1439     History   Chief Complaint Chief Complaint  Patient presents with  . Chest Pain    HPI Elizabeth Mcintyre is a 77 y.o. female.  HPI Patient has had 2 weeks of diarrhea.  Describes the stool as watery and up to 10 episodes a day.  Associated with some abdominal cramping which improves after having a bowel movement.  No recent hospitalizations or antibiotics.  States she was feeling better today and decided to go out shopping for groceries.  Developed chest heaviness, lightheadedness.  States she still has mild chest heaviness.  She also complains of shortness of breath especially with exertion for the past 2 weeks.  No definite fever or chills.  No focal weakness or numbness. Past Medical History:  Diagnosis Date  . Asthma   . 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 DIABETES MELLITUS, TYPE II 11/09/2007   diet control  . Diverticulosis 03/2011  . GI bleed   . History of rheumatoid arthritis    during 30's, was treated.  . Hyperlipidemia   . Hypertension   . Osteopenia 2017   Last  bone density 05/04/2017: -2.4  . PONV (postoperative nausea and vomiting)   . Stress incontinence     Patient Active Problem List   Diagnosis Date Noted  . 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 Combined hyperlipidemia associated with type 2 diabetes mellitus ([place]) 01/29/2017  . Carotid artery stenosis 06/13/2016  . CKD stage 3 secondary to diabetes ([place]) 02/14/2016  . Vitamin D deficiency 05/25/2015  . Schatzki's ring   . Type 2 diabetes mellitus with complication ([place]) 24/26/8341  . Gastroesophageal reflux disease with esophagitis 03/06/2015  . Gout 06/09/2014  . Acquired hypothyroidism 05/30/2014  . Osteopenia 12/02/2013  . Major depression, recurrent, chronic ([place]) 04/03/2011  . Mild intermittent asthma 11/09/2007  .  Essential hypertension, benign 11/09/2007    Past Surgical History:  Procedure Laterality Date  . ABDOMINAL HYSTERECTOMY    . CARDIAC CATHETERIZATION     X 2, last one in 1998  . CHOLECYSTECTOMY N/A 04/13/2017   Procedure: LAPAROSCOPIC CHOLECYSTECTOMY;  Surgeon: Aviva Signs, MD;  Location: [facility] ORS;  Service: General;  Laterality: N/A;  . COLONOSCOPY    . COLONOSCOPY  May 2012   Dr. Olevia Perches: mild diverticulosis, otherwise normal.   . ESOPHAGOGASTRODUODENOSCOPY N/A 01/28/2015   Dr. Gala Romney: reflux esophagitis, Schatzki's ring not manipulated due to recent bleeding  . ESOPHAGOGASTRODUODENOSCOPY N/A 03/30/2015   Dr. Gala Romney: Schatzki's ring s/p Venia Minks dilation, previously noted esophageal ulcer completely healed  . MALONEY DILATION N/A 03/30/2015   Procedure: Venia Minks DILATION;  Surgeon: Daneil Dolin, MD;  Location: [facility] ENDO SUITE;  Service: Endoscopy;  Laterality: N/A;     OB History   None      Home Medications    Prior to Admission medications   Medication Sig Start Date End Date Taking? Authorizing Provider  acetaminophen (TYLENOL) 325 MG tablet Take 650 mg by mouth every 6 (six) hours as needed for moderate pain.     [provider]  albuterol (PROVENTIL HFA;VENTOLIN HFA) 108 (90 Base) MCG/ACT inhaler Inhale 2 puffs into the lungs every 6 (six) hours as needed for wheezing. 10/16/17 10/16/18  Leamon Arnt, MD  allopurinol (ZYLOPRIM) 100 MG tablet Take 1 tablet (100 mg total) by mouth  daily. Needs office visit for further refills. 11/24/17   Leamon Arnt, MD  amLODipine (NORVASC) 10 MG tablet Take 1 tablet (10 mg total) by mouth daily. 01/25/18   Leamon Arnt, MD  Blood Glucose Monitoring Suppl (ONE TOUCH ULTRA SYSTEM KIT) W/DEVICE KIT 1 kit by Does not apply route once. 06/01/14   Nicky Pugh C, NP  FLUoxetine (PROZAC) 20 MG capsule TAKE 1 CAPSULE BY MOUTH EVERY DAY 02/16/18   Leamon Arnt, MD  glucose blood (ONE TOUCH ULTRA TEST) test strip Use 1 test strip to test blood  sugar levels 3 times daily. Dx. E11.9 08/12/16   Kuneff, Renee A, DO  levothyroxine (SYNTHROID, LEVOTHROID) 75 MCG tablet Take 1 tablet (75 mcg total) by mouth daily. 12/03/17   Leamon Arnt, MD  losartan (COZAAR) 50 MG tablet Take 1 tablet (50 mg total) by mouth daily. 11/24/17   Leamon Arnt, MD  [facility] DELICA LANCETS FINE MISC Test twice daily. 08/07/14   Nicky Pugh C, NP  pantoprazole (PROTONIX) 40 MG tablet Take 40 mg by mouth daily.    [provider]  potassium chloride SA (K-DUR,KLOR-CON) 20 MEQ tablet Take 1 tablet (20 mEq total) by mouth daily. 08/06/18   Julianne Rice, MD  rosuvastatin (CRESTOR) 40 MG tablet TAKE 1 TABLET BY MOUTH EVERY DAY 07/05/18   Leamon Arnt, MD  traZODone (DESYREL) 50 MG tablet Take 0.5-1 tablets (25-50 mg total) by mouth at bedtime as needed for sleep. 11/30/17   Leamon Arnt, MD  triamcinolone cream (KENALOG) 0.1 % Apply 1 application topically 2 (two) times daily. For 2-4 weeks as needed 01/25/18   Leamon Arnt, MD  vitamin B-12 (CYANOCOBALAMIN) 1000 MCG tablet Take 1,000 mcg by mouth daily.    [provider]  Vitamin D, Cholecalciferol, 400 UNITS CAPS Take 400 Units by mouth daily.    [provider]  Wheat Dextrin (BENEFIBER) POWD Take 1 scoop by mouth every other day.     [provider]    Family History Family History  Problem Relation Age of Onset  . Heart disease Mother   . Osteoarthritis Mother   . Sudden death Father   . Single kidney Father   . Other Father        h/o severe MVA injuries  . Hyperlipidemia Sister   . Other Daughter        Myalgias  . Fibromyalgia Daughter   . Allergies Daughter   . Heart disease Maternal Grandfather   . Sudden death Paternal Grandmother   . Diabetes Paternal Grandfather   . Heart disease Daughter   . Other Daughter        palpitations  . Pulmonary fibrosis Maternal Aunt   . Cancer Paternal Uncle   . Pulmonary fibrosis Maternal Aunt   . Colon  cancer Neg Hx     Social History Social History   Tobacco Use  . Smoking status: Never Smoker  . Smokeless tobacco: Never Used  Substance Use Topics  . Alcohol use: No  . Drug use: No     Allergies   Codeine phosphate   Review of Systems Review of Systems  Constitutional: Positive for fatigue. Negative for chills and fever.  HENT: Negative for sore throat and trouble swallowing.   Eyes: Negative for visual disturbance.  Respiratory: Negative for cough and shortness of breath.   Cardiovascular: Negative for chest pain.  Gastrointestinal: Positive for abdominal pain and diarrhea. Negative for blood in stool,  constipation, nausea and vomiting.  Genitourinary: Negative for dysuria, flank pain, frequency and hematuria.  Musculoskeletal: Negative for back pain, myalgias and neck pain.  Skin: Negative for rash and wound.  Neurological: Positive for dizziness and light-headedness. Negative for speech difficulty, weakness, numbness and headaches.  All other systems reviewed and are negative.    Physical Exam Updated Vital Signs BP (!) 142/65   Pulse 67   Resp 16   Ht '5\' 2"'$  (1.575 m)   Wt 68 kg   SpO2 95%   BMI 27.44 kg/m   Physical Exam  Constitutional: She is oriented to person, place, and time. She appears well-developed and well-nourished. No distress.  HENT:  Head: Normocephalic and atraumatic.  Mouth/Throat: Oropharynx is clear and moist. No oropharyngeal exudate.  Eyes: Pupils are equal, round, and reactive to light. EOM are normal.  Neck: Normal range of motion. Neck supple. No JVD present.  Cardiovascular: Normal rate and regular rhythm. Exam reveals no gallop and no friction rub.  No murmur heard. Pulmonary/Chest: Effort normal and breath sounds normal. No stridor. No respiratory distress. She has no wheezes. She has no rales. She exhibits no tenderness.  Abdominal: Soft. Bowel sounds are normal. There is no tenderness. There is no rebound and no guarding.    Musculoskeletal: Normal range of motion. She exhibits no edema or tenderness.  No lower extremity swelling, asymmetry or tenderness.  Lymphadenopathy:    She has no cervical adenopathy.  Neurological: She is alert and oriented to person, place, and time.  Moving all extremities without focal deficit.  Sensation fully intact.  Skin: Skin is warm and dry. Capillary refill takes less than 2 seconds. No rash noted. She is not diaphoretic. No erythema.  Psychiatric: She has a normal mood and affect. Her behavior is normal.  Nursing note and vitals reviewed.    ED Treatments / Results  Labs (all labs ordered are listed, but only abnormal results are displayed) Labs Reviewed  COMPREHENSIVE METABOLIC PANEL - Abnormal; Notable for the following components:      Result Value   Potassium 3.0 (*)    Glucose, Bld 114 (*)    Creatinine, Ser 1.42 (*)    GFR calc non Af Amer 35 (*)    GFR calc Af Amer 40 (*)    All other components within normal limits  LIPASE, BLOOD - Abnormal; Notable for the following components:   Lipase 52 (*)    All other components within normal limits  URINALYSIS, ROUTINE W REFLEX MICROSCOPIC - Abnormal; Notable for the following components:   Color, Urine STRAW (*)    Hgb urine dipstick SMALL (*)    All other components within normal limits  C DIFFICILE QUICK SCREEN W PCR REFLEX  TROPONIN I  CBC WITH DIFFERENTIAL/PLATELET  MAGNESIUM  TSH    EKG EKG Interpretation  Date/Time:  Thursday August 05 2018 14:47:19 EST Ventricular Rate:  83 PR Interval:    QRS Duration: 110 QT Interval:  399 QTC Calculation: 469 R Axis:   17 Text Interpretation:  Sinus rhythm Low voltage, precordial leads Probable left ventricular hypertrophy Borderline T abnormalities, anterior leads Confirmed by Julianne Rice [phone]) on 08/05/2018 3:14:05 PM   Radiology Dg Chest 2 View  Result Date: 08/05/2018 CLINICAL DATA:  77 year old female with chest pain radiating to the left  arm for several days and progressive. EXAM: CHEST - 2 VIEW COMPARISON:  Chest radiographs 05/25/2015. FINDINGS: The heart size and mediastinal contours are within normal limits. Calcified aortic atherosclerosis.  Both lungs are clear. No pneumothorax or pleural effusion. No acute osseous abnormality identified. Negative visible bowel gas pattern. IMPRESSION: 1. No cardiopulmonary abnormality. 2. Aortic Atherosclerosis (ICD10-I70.0). Electronically Signed   By: Genevie Ann M.D.   On: 08/05/2018 15:50    Procedures Procedures (including critical care time)  Medications Ordered in ED Medications  sodium chloride 0.9 % bolus 500 mL (0 mLs Intravenous Stopped 08/05/18 1620)  potassium chloride 10 mEq in 100 mL IVPB ( Intravenous Stopped 08/05/18 1847)  sodium chloride 0.9 % bolus 500 mL (0 mLs Intravenous Stopped 08/05/18 1900)     Initial Impression / Assessment and Plan / ED Course  I have reviewed the triage vital signs and the nursing notes.  Pertinent labs & imaging results that were available during my care of the patient were reviewed by me and considered in my medical decision making (see chart for details).     Potassium replaced in the emergency department.  Also given IV fluids.  Remain hemodynamically stable.  Ambulating without difficulty.  C. difficile is pending.  Chest pain is atypical.  Negative troponin and EKG without ischemic findings.  Encouraged to follow-up with gastroenterologist.  Strict return precautions given  Final Clinical Impressions(s) / ED Diagnoses   Final diagnoses:  Hypokalemia  Dehydration  Diarrhea, unspecified type  Atypical chest pain    ED Discharge Orders         Ordered    potassium chloride SA (K-DUR,KLOR-CON) 20 MEQ tablet  Daily     08/05/18 2021           Julianne Rice, MD 08/05/18 2024

## 2018-08-05 NOTE — ED Notes (Signed)
Pt using restroom to attempt to provide stool sample

## 2018-08-05 NOTE — ED Notes (Signed)
Pt c/o intermittent left sided chest pain that radiates to the left shoulder since last night. Pt describes the pain as "heaviness". Pt also reports complaints of SOB, nausea, dizziness, diaphoresis when the chest pain occurs. Pt also reports she has been having watery diarrhea for the past couple of months, worsening over the last 2 weeks. Pt had 1 episode of diarrhea in the last 24 hours, but usually has 5-10 episodes of diarrhea for the past 2 weeks. 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 Pt has appt with GI doctor in December.

## 2018-08-05 NOTE — ED Triage Notes (Signed)
Pt reports intermittent chest pain for past couple of weeks worse with exertion. Pt reports diaphoretic at times with SOB on exertion.

## 2018-09-02 ENCOUNTER — Ambulatory Visit: Payer: Medicare Other | Admitting: Family Medicine

## 2018-09-03 ENCOUNTER — Other Ambulatory Visit: Payer: Self-pay

## 2018-09-03 ENCOUNTER — Encounter: Payer: Self-pay | Admitting: Family Medicine

## 2018-09-03 ENCOUNTER — Ambulatory Visit (INDEPENDENT_AMBULATORY_CARE_PROVIDER_SITE_OTHER): Payer: Medicare Other | Admitting: Family Medicine

## 2018-09-03 VITALS — BP 136/70 | HR 74 | Temp 97.6°F | Resp 16 | Ht 62.0 in | Wt 150.2 lb

## 2018-09-03 DIAGNOSIS — E039 Hypothyroidism, unspecified: Secondary | ICD-10-CM | POA: Diagnosis not present

## 2018-09-03 DIAGNOSIS — E876 Hypokalemia: Secondary | ICD-10-CM

## 2018-09-03 DIAGNOSIS — M940 Chondrocostal junction syndrome [Tietze]: Secondary | ICD-10-CM

## 2018-09-03 DIAGNOSIS — Z23 Encounter for immunization: Secondary | ICD-10-CM

## 2018-09-03 DIAGNOSIS — E1169 Type 2 diabetes mellitus with other specified complication: Secondary | ICD-10-CM

## 2018-09-03 DIAGNOSIS — E2839 Other primary ovarian failure: Secondary | ICD-10-CM

## 2018-09-03 DIAGNOSIS — M8580 Other specified disorders of bone density and structure, unspecified site: Secondary | ICD-10-CM

## 2018-09-03 DIAGNOSIS — I10 Essential (primary) hypertension: Secondary | ICD-10-CM

## 2018-09-03 DIAGNOSIS — E118 Type 2 diabetes mellitus with unspecified complications: Secondary | ICD-10-CM | POA: Diagnosis not present

## 2018-09-03 DIAGNOSIS — R197 Diarrhea, unspecified: Secondary | ICD-10-CM | POA: Diagnosis not present

## 2018-09-03 DIAGNOSIS — E782 Mixed hyperlipidemia: Secondary | ICD-10-CM | POA: Diagnosis not present

## 2018-09-03 DIAGNOSIS — Z1239 Encounter for other screening for malignant neoplasm of breast: Secondary | ICD-10-CM

## 2018-09-03 LAB — LIPID PANEL
CHOL/HDL RATIO: 3
CHOLESTEROL: 140 mg/dL (ref 0–200)
HDL: 44.6 mg/dL (ref >39.00)
NonHDL: 95.37
TRIGLYCERIDES: 247 mg/dL — AB (ref 0.0–149.0)
VLDL: 49.4 mg/dL — ABNORMAL HIGH (ref 0.0–40.0)

## 2018-09-03 LAB — COMPREHENSIVE METABOLIC PANEL
ALBUMIN: 4.6 g/dL (ref 3.5–5.2)
ALT: 15 U/L (ref 0–35)
AST: 21 U/L (ref 0–37)
Alkaline Phosphatase: 63 U/L (ref 39–117)
BUN: 16 mg/dL (ref 6–23)
CALCIUM: 9.4 mg/dL (ref 8.4–10.5)
CO2: 29 mEq/L (ref 19–32)
Chloride: 103 mEq/L (ref 96–112)
Creatinine, Ser: 1.23 mg/dL — ABNORMAL HIGH (ref 0.40–1.20)
GFR: 45.08 mL/min — AB (ref >60.00)
Glucose, Bld: 154 mg/dL — ABNORMAL HIGH (ref 70–99)
POTASSIUM: 4.2 meq/L (ref 3.5–5.1)
Sodium: 141 mEq/L (ref 135–145)
Total Bilirubin: 0.5 mg/dL (ref 0.2–1.2)
Total Protein: 7.3 g/dL (ref 6.0–8.3)

## 2018-09-03 LAB — CBC WITH DIFFERENTIAL/PLATELET
Basophils Absolute: 0.1 10*3/uL (ref 0.0–0.1)
Basophils Relative: 1.2 % (ref 0.0–3.0)
EOS PCT: 8.2 % — AB (ref 0.0–5.0)
Eosinophils Absolute: 0.5 10*3/uL (ref 0.0–0.7)
HEMATOCRIT: 40.4 % (ref 36.0–46.0)
HEMOGLOBIN: 13.8 g/dL (ref 12.0–15.0)
LYMPHS PCT: 35.3 % (ref 12.0–46.0)
Lymphs Abs: 2.3 10*3/uL (ref 0.7–4.0)
MCHC: 34.1 g/dL (ref 30.0–36.0)
MCV: 83.8 fl (ref 78.0–100.0)
MONOS PCT: 8.3 % (ref 3.0–12.0)
Monocytes Absolute: 0.5 10*3/uL (ref 0.1–1.0)
NEUTROS ABS: 3 10*3/uL (ref 1.4–7.7)
Neutrophils Relative %: 47 % (ref 43.0–77.0)
Platelets: 163 10*3/uL (ref 150.0–400.0)
RBC: 4.82 Mil/uL (ref 3.87–5.11)
RDW: 14.8 % (ref 11.5–15.5)
WBC: 6.4 10*3/uL (ref 4.0–10.5)

## 2018-09-03 LAB — LDL CHOLESTEROL, DIRECT: Direct LDL: 70 mg/dL

## 2018-09-03 LAB — POCT GLYCOSYLATED HEMOGLOBIN (HGB A1C): Hemoglobin A1C: 6.3 % — AB (ref 4.0–5.6)

## 2018-09-03 NOTE — Patient Instructions (Addendum)
Please return in 6 weeks for recheck with me.  Please schedule AWV with Maudie Mercury prior to that for mood and cognitive check.   We will call you with information regarding your referral appointment. [facility].  If you do not hear from [place] within the next 2 weeks, please let me know. It can take 1-2 weeks to get appointments set up with the specialists.   If you have any questions or concerns, please don't hesitate to send me a message via MyChart or call the office at [phone]. Thank you for visiting with [place] today! It's our pleasure caring for you.  I will release your lab results to you on your MyChart account with further instructions. Please reply with any questions.   Today you were given your influenza vaccination.

## 2018-09-03 NOTE — Progress Notes (Signed)
Subjective  CC:  Chief Complaint  Patient presents with  . Hospitalization Follow-up  . Dehydration    HPI: Elizabeth Mcintyre is a 77 y.o. female who presents to the office today for follow up of diabetes and problems listed above in the chief complaint.   I've personally reviewed recent office visit notes, hospital notes, associated labs and imaging reports and/or pertinent outside office records via chart review or CareEverywhere. Briefly, pt was evaluated 11/21 for diarrhea and chest tightness. eval revealed hypokalemia and dehydration. Pt reports diarrhea has improved however still persist.  Complains of intermittent watery stool.  No longer having abdominal pain.  No blood in the mucus in the stool.  She does have appointment with her gastroenterologist next week for follow-up.  Lab studies at the ER were unrevealing.  She has been on potassium supplementation for the last month from the ER.  Appetite is normal.  No weight loss.  Also complained of chest tightness with negative EKG and normal troponins.  Chest pain persists.  Worse with certain movements.   Diabetes follow up: Her diabetic control is reported as Unchanged.  She does not check her sugars.  Denies symptoms of hyperglycemia. She denies exertional CP or SOB or symptomatic hypoglycemia. She denies foot sores or paresthesias.  She is diet controlled  Hypothyroidism is at goal by recent lab work.  Continues to take daily supplementation.  Energy level is good.  Hyperlipidemia due for lab recheck.  She is nonfasting today.  She is compliant with her medication.  She would qualify for an aspirin as well.  Health maintenance: Due for flu vaccination, mammogram and bone density testing.  Also due for annual wellness visit.  Review of systems is positive for forgetfulness and apathy.  She is on Prozac for depression.  Assessment  1. Diarrhea, unspecified type   2. Type 2 diabetes mellitus with complication (HCC)   3. Combined  hyperlipidemia associated with type 2 diabetes mellitus ([place])   4. Acquired hypothyroidism   5. Essential hypertension, benign   6. Breast cancer screening   7. Hypoestrogenism   8. Osteopenia, unspecified location   9. Hypokalemia   10. Costochondritis      Plan   Diabetes is currently well controlled.  Continue with diabetic diet.  Continue current medications for renal protection.  Influenza vaccination given today  Recheck hypokalemia and renal function.  Diarrhea: Follow-up with GI for further evaluation and recommendations.  Improved and stable  Health maintenance: Mammogram and bone density ordered today.  Hypertension is controlled.  Return for annual wellness visit for depression and cognitive evaluations.  Then will follow-up with me for further evaluation and treatment recommendations.  Left chest wall pain consistent with costochondritis.  Will need to add low-dose aspirin therapy for cardiovascular risk factors.  Reassured.  Follow up: Return in about 6 weeks (around 10/15/2018) for recheck, AWV at patient's convenience.. Orders Placed This Encounter  Procedures  . MM DIGITAL SCREENING BILATERAL  . DG Bone Density  . CBC with Differential/Platelet  . Comprehensive metabolic panel  . Lipid panel  . POCT glycosylated hemoglobin (Hb A1C)   No orders of the defined types were placed in this encounter.     Immunization History  Administered Date(s) Administered  . Influenza Split 07/16/2011  . Influenza, High Dose Seasonal PF 10/15/2017  . Influenza,inj,Quad PF,6+ Mos 06/09/2014, 10/15/2015  . Pneumococcal Conjugate-13 03/13/2017  . Pneumococcal Polysaccharide-23 10/27/2014  . Tdap 07/16/2011    Diabetes Related Lab Review:  Lab Results  Component Value Date   HGBA1C 6.3 (A) 09/03/2018   HGBA1C 6.0 01/25/2018   HGBA1C 6.3 10/15/2017    Lab Results  Component Value Date   MICROALBUR 2.2 (H) 04/07/2014   Lab Results  Component Value Date   CREATININE  1.42 (H) 08/05/2018   BUN 20 08/05/2018   NA 139 08/05/2018   K 3.0 (L) 08/05/2018   CL 104 08/05/2018   CO2 24 08/05/2018   Lab Results  Component Value Date   CHOL 157 10/15/2017   CHOL 187 01/29/2017   CHOL 161 02/23/2015   Lab Results  Component Value Date   HDL 51.80 10/15/2017   HDL 53.00 01/29/2017   HDL 46.90 02/23/2015   Lab Results  Component Value Date   LDLCALC 176 (H) 11/02/2014   LDLCALC 27 09/01/2013   LDLCALC 31 07/11/2011   Lab Results  Component Value Date   TRIG 255.0 (H) 10/15/2017   TRIG 345.0 (H) 01/29/2017   TRIG 243.0 (H) 02/23/2015   Lab Results  Component Value Date   CHOLHDL 3 10/15/2017   CHOLHDL 4 01/29/2017   CHOLHDL 3 02/23/2015   Lab Results  Component Value Date   LDLDIRECT 67.0 10/15/2017   LDLDIRECT 76.0 01/29/2017   LDLDIRECT 75.0 02/23/2015   Wt Readings from Last 3 Encounters:  09/03/18 150 lb 3.2 oz (68.1 kg)  08/05/18 150 lb (68 kg)  01/25/18 147 lb 6.4 oz (66.9 kg)    The 10-year ASCVD risk score Mikey Bussing DC Jr., et al., 2013) is: 43.4%   Values used to calculate the score:     Age: 60 years     Sex: Female     Is Non-Hispanic African American: No     Diabetic: Yes     Tobacco smoker: No     Systolic Blood Pressure: 993 mmHg     Is BP treated: Yes     HDL Cholesterol: 51.8 mg/dL     Total Cholesterol: 157 mg/dL I have reviewed the PMH, Fam and Soc history. Patient Active Problem List   Diagnosis Date Noted  . Combined hyperlipidemia associated with type 2 diabetes mellitus ([place]) 01/29/2017    Priority: High  . CKD stage 3 secondary to diabetes ([place]) 02/14/2016    Priority: High    GFR 36--> monitor 3 mos.    . Schatzki's ring     Priority: High  . Type 2 diabetes mellitus with complication ([place]) 71/69/6789    Priority: High  . Acquired hypothyroidism 05/30/2014    Priority: High  . Major depression, recurrent, chronic ([place]) 04/03/2011    Priority: High    Managed with prozac for years; started after  death of husband.    . Essential hypertension, benign 11/09/2007    Priority: High    Current med losartan 100 mg qd, amlodopine 10 mg qd   . Lichen sclerosus et atrophicus of the vulva 10/15/2017    Priority: Medium  . Gastroesophageal reflux disease with esophagitis 03/06/2015    Priority: Medium  . Gout 06/09/2014    Priority: Medium    R great toe, ball of foot Stopped HCTZ several mos ago,  On losartan        . Osteopenia 12/02/2013    Priority: Medium    Dexa scan August/2018: -2.4    . Mild intermittent asthma 11/09/2007    Priority: Medium    Qualifier: Diagnosis of  By: Burnice Logan  MD, Doretha Sou Notes 2 maternal aunts and maternal grandfather  died from pulmonary fibrosis from unknown causes.    . Vitamin D deficiency 05/25/2015    Priority: Low  . Carotid artery stenosis 06/13/2016    Social History: Patient  reports that she has never smoked. She has never used smokeless tobacco. She reports that she does not drink alcohol or use drugs.  Review of Systems: Ophthalmic: negative for eye pain, loss of vision or double vision Cardiovascular: negative for chest pain Respiratory: negative for SOB or persistent cough Gastrointestinal: negative for abdominal pain Genitourinary: negative for dysuria or gross hematuria MSK: negative for foot lesions Neurologic: negative for weakness or gait disturbance  Objective  Vitals: BP 136/70   Pulse 74   Temp 97.6 F (36.4 C) (Oral)   Resp 16   Ht 5\' 2"  (1.575 m)   Wt 150 lb 3.2 oz (68.1 kg)   SpO2 97%   BMI 27.47 kg/m  General: well appearing, no acute distress  Psych:  Alert and oriented, flat mood and affect HEENT:  Normocephalic, atraumatic, moist mucous membranes, supple neck  Cardiovascular:  Nl S1 and S2, RRR without murmur, gallop or rub. no edema, left costochondral junction extremely tender Respiratory:  Good breath sounds bilaterally, CTAB with normal effort, no rales Gastrointestinal: normal BS, soft,  nontender Skin:  Warm, upper chest and shoulders with picker's nodules and scars. Neurologic:   Mental status is normal. normal gait Lab Results  Component Value Date   CREATININE 1.42 (H) 08/05/2018   BUN 20 08/05/2018   NA 139 08/05/2018   K 3.0 (L) 08/05/2018   CL 104 08/05/2018   CO2 24 08/05/2018       Diabetic education: ongoing education regarding chronic disease management for diabetes was given today. We continue to reinforce the ABC's of diabetic management: A1c (<7 or 8 dependent upon patient), tight blood pressure control, and cholesterol management with goal LDL < 100 minimally. We discuss diet strategies, exercise recommendations, medication options and possible side effects. At each visit, we review recommended immunizations and preventive care recommendations for diabetics and stress that good diabetic control can prevent other problems. See below for this patient's data.    Commons side effects, risks, benefits, and alternatives for medications and treatment plan prescribed today were discussed, and the patient expressed understanding of the given instructions. Patient is instructed to call or message via MyChart if he/she has any questions or concerns regarding our treatment plan. No barriers to understanding were identified. We discussed Red Flag symptoms and signs in detail. Patient expressed understanding regarding what to do in case of urgent or emergency type symptoms.   Medication list was reconciled, printed and provided to the patient in AVS. Patient instructions and summary information was reviewed with the patient as documented in the AVS. This note was prepared with assistance of Dragon voice recognition software. Occasional wrong-word or sound-a-like substitutions may have occurred due to the inherent limitations of voice recognition software

## 2018-09-10 ENCOUNTER — Other Ambulatory Visit: Payer: Self-pay | Admitting: Family Medicine

## 2018-09-10 ENCOUNTER — Encounter: Payer: Self-pay | Admitting: Nurse Practitioner

## 2018-09-10 ENCOUNTER — Encounter

## 2018-09-10 ENCOUNTER — Ambulatory Visit (INDEPENDENT_AMBULATORY_CARE_PROVIDER_SITE_OTHER): Payer: Medicare Other | Admitting: Nurse Practitioner

## 2018-09-10 VITALS — BP 159/85 | HR 78 | Temp 97.1°F | Ht 61.0 in | Wt 149.0 lb

## 2018-09-10 DIAGNOSIS — R109 Unspecified abdominal pain: Secondary | ICD-10-CM | POA: Insufficient documentation

## 2018-09-10 DIAGNOSIS — I10 Essential (primary) hypertension: Secondary | ICD-10-CM

## 2018-09-10 DIAGNOSIS — K2100 Gastro-esophageal reflux disease with esophagitis, without bleeding: Secondary | ICD-10-CM

## 2018-09-10 DIAGNOSIS — R197 Diarrhea, unspecified: Secondary | ICD-10-CM | POA: Diagnosis not present

## 2018-09-10 DIAGNOSIS — K529 Noninfective gastroenteritis and colitis, unspecified: Secondary | ICD-10-CM | POA: Insufficient documentation

## 2018-09-10 DIAGNOSIS — R1030 Lower abdominal pain, unspecified: Secondary | ICD-10-CM

## 2018-09-10 MED ORDER — PANTOPRAZOLE SODIUM 40 MG PO TBEC: 40.0000 mg | DELAYED_RELEASE_TABLET | Freq: Every day | ORAL | 3 refills | Status: DC

## 2018-09-10 NOTE — Patient Instructions (Signed)
1. Complete your stool test when you are able to. 2. If it is normal we can send in the medication to help with your diarrhea symptoms. 3. Start taking a probiotic daily for about the next 2 to 3 months. 4. I am sending in a prescription to your pharmacy to restart taking Protonix once daily. 5. Return for follow-up in 2 to 3 months. 6. Call us if you have any questions or concerns.  At [facility] we value your feedback. You may receive a survey about your visit today. Please share your experience as we strive to create trusting relationships with our patients to provide genuine, compassionate, quality care.  We appreciate your understanding and patience as we review any laboratory studies, imaging, and other diagnostic tests that are ordered as we care for you. Our office policy is 5 business days for review of these results, and any emergent or urgent results are addressed in a timely manner for your best interest. If you do not hear from our office in 1 week, please contact us.   We also encourage the use of MyChart, which contains your medical information for your review as well. If you are not enrolled in this feature, an access code is on this after visit summary for your convenience. Thank you for allowing [place] to be involved in your care.  It was great to see you today!  I hope you have a Happy New Year!!

## 2018-09-10 NOTE — Assessment & Plan Note (Signed)
The patient complains of abdominal pain.  Mostly lower abdominal pain, crampy and dull which sometimes relieves after a bowel movement.  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  She also has epigastric tenderness on palpation.  This is mild in nature and in the setting of known chronic history of GERD, not currently on PPI.  Further GERD management as per above.  Diarrhea management as per above.  Return for follow-up in 2 to 3 months.

## 2018-09-10 NOTE — Assessment & Plan Note (Signed)
The patient complains of diarrhea which started 4 months ago history of cholecystectomy but no diarrhea postoperatively.  C. difficile quick screen was negative for antigen and toxin in the emergency department.  She does have hypothyroidism and is on Synthroid.  However, her levels were recently checked in end of November and found to be normal so this is unlikely culprit.  We will check a GI pathogen panel for other occult infection.  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  If her GI pathogen panel is negative we will start her on Bentyl or the like to help with symptomatic management.  I will have her start probiotics now.  Follow-up in 2 to 3 months.

## 2018-09-10 NOTE — Assessment & Plan Note (Signed)
The patient complains of intermittent chest pain and points to substernal/esophageal area.  She mentioned this during her emergency room visit and EKG was reviewed and found to be normal.  Troponin is normal as well.  This is been brought to the attention of primary care who recommended discussion with GI.  The patient has a chronic history of GERD and was previously on Protonix 40 mg daily.  She is no longer taking this, unsure when she last took it.  We will have her restart her Protonix to see if this results in relief.  Follow-up in 2 to 3 months.

## 2018-09-10 NOTE — Progress Notes (Signed)
Referring Provider: Leamon Arnt, MD Primary Care Physician:  Leamon Arnt, MD Primary GI:  Dr. Gala Romney  Chief Complaint  Patient presents with  . Diarrhea    x4 months, off and on    HPI:   Elizabeth Mcintyre is a 77 y.o. female who presents on referral from primary care for watery diarrhea.  Patient has not been seen by our office in 02/19/2017 when she was seen for right upper quadrant abdominal pain and GERD.  History of Schatzki's ring status post dilation, last in 2016.  Colonoscopy up-to-date and due in 2022.  Some epigastric and right upper quadrant pain, not worsened by movement or eating.  Occasional nausea.  Intermittent constipation presentation.  February 2016 ultrasound normal.  Recommended labs and HIDA scan, follow-up in 8 weeks.  HFP completed 02/19/2017 was normal.  HFP completed 03/04/2017 found diminished gallbladder ejection fraction of 16% (normal is 33% or more).  Suggestive of gallbladder dysfunction.  Recommended referral to surgery.  Patient did see surgery and underwent laparoscopic cholecystectomy on 04/13/2017.  She had an uncomplicated postoperative period.  The patient was seen by primary care on 09/03/2018 for diarrhea and other chronic conditions.  Known to be diabetic with good control.  Noted diarrhea, improved and stable.  No further information other than lack of abdominal pain.  Recommended follow-up with GI.  Labs completed 09/03/2018 found creatinine elevated, but stable/improved at 1.23.  Otherwise CMP normal.  CBC essentially normal.  Hemoglobin A1c stable at 6.3.  Today she states she's doing ok overall. Diarrhea started about 4 months ago and is intermittent. Was occurring weekly and typically lasts a day or two. Feels like it's improving in volume. Watery diarrhea, When she has diarrhea, typically occurs 10 times a day. Random onset. Denies hematochezia, melena. Has abdominal pain when she has the diarrhea, sometimes when she doesn't. Pain located  right-sided to mid abdomen; generally in the lower abdomen. Pain is generally crampy, sometimes sharp; does improve after a good bowel movement. No international travel last 6 months. Denies sick contacts or healthcare exposure around time of onset. Admits chest pain, dyspnea on exertion and states she notified her PCP who recommended seeing GI for this. In the ER 08/05/18, she had an EKG which was unremarkable and CDiff quick scan was negative. Denies overt GERD symptoms other than bitter taste. Is not currently taking Protonix per med list, confirmed by patient. Denies dizziness, lightheadedness, syncope, near syncope. Denies any other upper or lower GI symptoms.  History of hypothyroidism on Synthroid.  Past Medical History:  Diagnosis Date  . Asthma   . 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 DIABETES MELLITUS, TYPE II 11/09/2007   diet control  . Diverticulosis 03/2011  . GERD (gastroesophageal reflux disease)   . GI bleed   . History of rheumatoid arthritis    during 30's, was treated.  . Hyperlipidemia   . Hypertension   . Osteopenia 2017   Last  bone density 05/04/2017: -2.4  . PONV (postoperative nausea and vomiting)   . Stress incontinence     Past Surgical History:  Procedure Laterality Date  . ABDOMINAL HYSTERECTOMY    . CARDIAC CATHETERIZATION     X 2, last one in 1998  . CHOLECYSTECTOMY N/A 04/13/2017   Procedure: LAPAROSCOPIC CHOLECYSTECTOMY;  Surgeon: Aviva Signs, MD;  Location: [facility];  Service: General;  Laterality: N/A;  . COLONOSCOPY    .  COLONOSCOPY  May 2012   Dr. Olevia Perches: mild diverticulosis, otherwise normal.   . ESOPHAGOGASTRODUODENOSCOPY N/A 01/28/2015   Dr. Gala Romney: reflux esophagitis, Schatzki's ring not manipulated due to recent bleeding  . ESOPHAGOGASTRODUODENOSCOPY N/A 03/30/2015   Dr. Gala Romney: Schatzki's ring s/p Venia Minks dilation, previously noted esophageal ulcer completely healed  . MALONEY DILATION N/A 03/30/2015    Procedure: Venia Minks DILATION;  Surgeon: Daneil Dolin, MD;  Location: [facility];  Service: Endoscopy;  Laterality: N/A;    Current Outpatient Medications  Medication Sig Dispense Refill  . acetaminophen (TYLENOL) 325 MG tablet Take 650 mg by mouth every 6 (six) hours as needed for moderate pain.     Marland Kitchen albuterol (PROVENTIL HFA;VENTOLIN HFA) 108 (90 Base) MCG/ACT inhaler Inhale 2 puffs into the lungs every 6 (six) hours as needed for wheezing. 8.5 g 2  . allopurinol (ZYLOPRIM) 100 MG tablet Take 1 tablet (100 mg total) by mouth daily. Needs office visit for further refills. 90 tablet 1  . amLODipine (NORVASC) 10 MG tablet Take 1 tablet (10 mg total) by mouth daily. 90 tablet 3  . Blood Glucose Monitoring Suppl (ONE TOUCH ULTRA SYSTEM KIT) W/DEVICE KIT 1 kit by Does not apply route once. 1 each 0  . FLUoxetine (PROZAC) 20 MG capsule TAKE 1 CAPSULE BY MOUTH EVERY DAY 90 capsule 3  . glucose blood (ONE TOUCH ULTRA TEST) test strip Use 1 test strip to test blood sugar levels 3 times daily. Dx. E11.9 100 each 12  . levothyroxine (SYNTHROID, LEVOTHROID) 75 MCG tablet Take 1 tablet (75 mcg total) by mouth daily. 90 tablet 3  . ONETOUCH DELICA LANCETS FINE MISC Test twice daily. 180 each 2  . potassium chloride SA (K-DUR,KLOR-CON) 20 MEQ tablet Take 1 tablet (20 mEq total) by mouth daily. 3 tablet 0  . rosuvastatin (CRESTOR) 40 MG tablet TAKE 1 TABLET BY MOUTH EVERY DAY 90 tablet 3  . triamcinolone cream (KENALOG) 0.1 % Apply 1 application topically 2 (two) times daily. For 2-4 weeks as needed 45 g 2  . vitamin B-12 (CYANOCOBALAMIN) 1000 MCG tablet Take 1,000 mcg by mouth daily.    . Vitamin D, Cholecalciferol, 400 UNITS CAPS Take 400 Units by mouth daily.    . Wheat Dextrin (BENEFIBER) POWD Take 1 scoop by mouth every other day.     . losartan (COZAAR) 50 MG tablet TAKE 1 TABLET BY MOUTH EVERY DAY 90 tablet 0  . pantoprazole (PROTONIX) 40 MG tablet Take 1 tablet (40 mg total) by mouth daily. 30  tablet 3   No current facility-administered medications for this visit.     Allergies as of 09/10/2018 - Review Complete 09/10/2018  Allergen Reaction Noted  . Codeine phosphate Nausea And Vomiting and Rash     Family History  Problem Relation Age of Onset  . Heart disease Mother   . Osteoarthritis Mother   . Sudden death Father   . Single kidney Father   . Other Father        h/o severe MVA injuries  . Hyperlipidemia Sister   . Other Daughter        Myalgias  . Fibromyalgia Daughter   . Allergies Daughter   . Heart disease Maternal Grandfather   . Sudden death Paternal Grandmother   . Diabetes Paternal Grandfather   . Heart disease Daughter   . Other Daughter        palpitations  . Pulmonary fibrosis Maternal Aunt   . Cancer Paternal Uncle   .  Pulmonary fibrosis Maternal Aunt   . Colon cancer Neg Hx     Social History   Socioeconomic History  . Marital status: Widowed    Spouse name: Not on file  . Number of children: 2  . Years of education: Not on file  . Highest education level: Not on file  Occupational History  . Occupation: retired  Scientific laboratory technician  . Financial resource strain: Not on file  . Food insecurity:    Worry: Not on file    Inability: Not on file  . Transportation needs:    Medical: Not on file    Non-medical: Not on file  Tobacco Use  . Smoking status: Never Smoker  . Smokeless tobacco: Never Used  Substance and Sexual Activity  . Alcohol use: No  . Drug use: No  . Sexual activity: Never  Lifestyle  . Physical activity:    Days per week: Not on file    Minutes per session: Not on file  . Stress: Not on file  Relationships  . Social connections:    Talks on phone: Not on file    Gets together: Not on file    Attends religious service: Not on file    Active member of club or organization: Not on file    Attends meetings of clubs or organizations: Not on file    Relationship status: Not on file  Other Topics Concern  . Not on file    Social History Narrative   Ms. Capek is widowed. Her young grandson lives with her, for whom she shares custody with her daughter, the son's aunt.      Review of Systems: General: Negative for anorexia, weight loss, fever, chills, fatigue, weakness. ENT: Negative for hoarseness, difficulty swallowing. CV: Negative for chest pain, angina, palpitations, peripheral edema.  Respiratory: Negative for dyspnea at rest, cough, sputum, wheezing.  GI: See history of present illness. Endo: Negative for unusual weight change.  Heme: Negative for bruising or bleeding.   Physical Exam: BP (!) 159/85   Pulse 78   Temp (!) 97.1 F (36.2 C) (Oral)   Ht _0  (1.549 m)   Wt 149 lb (67.6 kg)   BMI 28.15 kg/m  General:   Alert and oriented. Pleasant and cooperative. Well-nourished and well-developed.  Eyes:  Without icterus, sclera clear and conjunctiva pink.  Ears:  Normal auditory acuity. Cardiovascular:  S1, S2 present without murmurs appreciated. Extremities without clubbing or edema. Respiratory:  Clear to auscultation bilaterally. No wheezes, rales, or rhonchi. No distress.  Gastrointestinal:  +BS, soft, non-tender and non-distended. No HSM noted. No guarding or rebound. No masses appreciated.  Rectal:  Deferred  Musculoskalatal:  Symmetrical without gross deformities. Neurologic:  Alert and oriented x4;  grossly normal neurologically. Psych:  Alert and cooperative. Normal mood and affect. Heme/Lymph/Immune: No excessive bruising noted.    09/10/2018 2:21 PM   Disclaimer: This note was dictated with voice recognition software. Similar sounding words can inadvertently be transcribed and may not be corrected upon review.

## 2018-09-13 NOTE — Progress Notes (Signed)
cc'ed to pcp °

## 2018-09-28 NOTE — Progress Notes (Signed)
**Note DeMcintyreIdentified via Obfuscation** Subjective:   Elizabeth Mcintyre is a 78 y.o. female who presents for Medicare Annual (Subsequent) preventive examination.  Review of Systems:  No ROS.  Medicare Wellness Visit. Additional risk factors are reflected in the social history.  Cardiac Risk Factors include: advanced age (>84mn, >>82women);dyslipidemia;diabetes mellitus;hypertension;family history of premature cardiovascular disease   Sleep patterns: Sleeps 2Mcintyre6 hours. Interrupted.  Home Safety/Smoke Alarms: Feels safe in home. Smoke alarms in place.  Living environment; residence and Firearm Safety: Friend and grandson (9yo) lives with pt in 2 story home with basement.  Seat Belt Safety/Bike Helmet: Wears seat belt.   Female:   [id number]      MammoMcintyre08/20/2018,  BIMcintyreRADS CATEGORY  1: Negative. Ordered by PCP in 08/2018.  Dexa scanMcintyre08/20/2018, Osteopenia.  CCSMcintyreColonoscopy 01/30/2011, normal. Recall 10 years.      Objective:     Vitals: BP (!) 146/84 (BP Location: Left Arm, Patient Position: Sitting, Cuff Size: Normal)   Pulse 66   Ht _0  (1.549 m)   Wt 150 lb 8 oz (68.3 kg)   SpO2 98%   BMI 28.44 kg/m   Body mass index is 28.44 kg/m.  Advanced Directives 09/29/2018 08/05/2018 04/13/2017 04/10/2017 03/13/2017 03/30/2015 01/27/2015  Does Patient Have a Medical Advance Directive? _1  Yes No  Type of Advance Directive Living will;Healthcare Power of Elizabeth VayaLiving will HRobert LeeLiving will HTygh ValleyLiving will [place]San Felipe [place]bloLiving will [place]-  Does patient want to make changes to medical advance directive? - No - Patient declined - - - - -  Copy of HGeneseein Chart? No - copy requested No - copy requested No - copy requested No - copy requested No - copy requested No - copy requested -    Tobacco Social History   Tobacco Use  Smoking Status Never Smoker  Smokeless Tobacco  Never Used     Counseling given: Not Answered   Past Medical History:  Diagnosis Date  . Asthma   . 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 DIABETES MELLITUS, TYPE II 11/09/2007   diet control  . Diverticulosis 03/2011  . GERD (gastroesophageal reflux disease)   . GI bleed   . History of rheumatoid arthritis    during 30's, was treated.  . Hyperlipidemia   . Hypertension   . Osteopenia 2017   Last  bone density 05/04/2017: -2.4  . PONV (postoperative nausea and vomiting)   . Stress incontinence    Past Surgical History:  Procedure Laterality Date  . ABDOMINAL HYSTERECTOMY    . CARDIAC CATHETERIZATION     X 2, last one in 1998  . CHOLECYSTECTOMY N/A 04/13/2017   Procedure: LAPAROSCOPIC CHOLECYSTECTOMY;  Surgeon: JAviva Signs MD;  Location: [facility];  Service: General;  Laterality: N/A;  . COLONOSCOPY    . COLONOSCOPY  May 2012   Dr. BOlevia Perches mild diverticulosis, otherwise normal.   . ESOPHAGOGASTRODUODENOSCOPY N/A 01/28/2015   Dr. RGala Romney reflux esophagitis, Schatzki's ring not manipulated due to recent bleeding  . ESOPHAGOGASTRODUODENOSCOPY N/A 03/30/2015   Dr. RGala Romney Schatzki's ring s/p MVenia Minksdilation, previously noted esophageal ulcer completely healed  . MALONEY DILATION N/A 03/30/2015   Procedure: MVenia MinksDILATION;  Surgeon: RDaneil Dolin MD;  Location: [facility];  Service: Endoscopy;  Laterality: N/A;   Family History  Problem Relation Age of Onset  .  Heart disease Mother   . Osteoarthritis Mother   . Sudden death Father   . Single kidney Father   . Other Father        h/o severe MVA injuries  . Hyperlipidemia Sister   . Other Daughter        Myalgias  . Fibromyalgia Daughter   . Allergies Daughter   . Heart disease Maternal Grandfather   . Sudden death Paternal Grandmother   . Diabetes Paternal Grandfather   . Heart disease Daughter   . Other Daughter        palpitations  . Pulmonary fibrosis Maternal Aunt   .  Cancer Paternal Uncle   . Pulmonary fibrosis Maternal Aunt   . Colon cancer Neg Hx    Social History   Socioeconomic History  . Marital status: Widowed    Spouse name: Not on file  . Number of children: 2  . Years of education: Not on file  . Highest education level: Not on file  Occupational History  . Occupation: retired  Scientific laboratory technician  . Financial resource strain: Not on file  . Food insecurity:    Worry: Not on file    Inability: Not on file  . Transportation needs:    Medical: Not on file    NonMcintyremedical: Not on file  Tobacco Use  . Smoking status: Never Smoker  . Smokeless tobacco: Never Used  Substance and Sexual Activity  . Alcohol use: No  . Drug use: No  . Sexual activity: Never  Lifestyle  . Physical activity:    Days per week: Not on file    Minutes per session: Not on file  . Stress: Not on file  Relationships  . Social connections:    Talks on phone: Not on file    Gets together: Not on file    Attends religious service: Not on file    Active member of club or organization: Not on file    Attends meetings of clubs or organizations: Not on file    Relationship status: Not on file  Other Topics Concern  . Not on file  Social History Narrative   Elizabeth Mcintyre is widowed. Her young grandson lives with her, for whom she shares custody with her daughter, the son's aunt.      Outpatient Encounter Medications as of 09/29/2018  Medication Sig  . acetaminophen (TYLENOL) 325 MG tablet Take 650 mg by mouth every 6 (six) hours as needed for moderate pain.   Marland Kitchen albuterol (PROVENTIL HFA;VENTOLIN HFA) 108 (90 Base) MCG/ACT inhaler Inhale 2 puffs into the lungs every 6 (six) hours as needed for wheezing.  Marland Kitchen allopurinol (ZYLOPRIM) 100 MG tablet Take 1 tablet (100 mg total) by mouth daily. Needs office visit for further refills.  Marland Kitchen amLODipine (NORVASC) 10 MG tablet Take 1 tablet (10 mg total) by mouth daily.  . Blood Glucose Monitoring Suppl (ONE TOUCH ULTRA SYSTEM KIT)  W/DEVICE KIT 1 kit by Does not apply route once.  Marland Kitchen FLUoxetine (PROZAC) 20 MG capsule TAKE 1 CAPSULE BY MOUTH EVERY DAY  . glucose blood (ONE TOUCH ULTRA TEST) test strip Use 1 test strip to test blood sugar levels 3 times daily. Dx. E11.9  . levothyroxine (SYNTHROID, LEVOTHROID) 75 MCG tablet Take 1 tablet (75 mcg total) by mouth daily.  Marland Kitchen losartan (COZAAR) 50 MG tablet TAKE 1 TABLET BY MOUTH EVERY DAY  . ONETOUCH DELICA LANCETS FINE MISC Test twice daily.  . pantoprazole (PROTONIX) 40 MG tablet Take 1  tablet (40 mg total) by mouth daily.  . potassium chloride SA (KMcintyreDUR,KLORMcintyreCON) 20 MEQ tablet Take 1 tablet (20 mEq total) by mouth daily.  . rosuvastatin (CRESTOR) 40 MG tablet TAKE 1 TABLET BY MOUTH EVERY DAY  . triamcinolone cream (KENALOG) 0.1 % Apply 1 application topically 2 (two) times daily. For 2Mcintyre4 weeks as needed  . Wheat Dextrin (BENEFIBER) POWD Take 1 scoop by mouth every other day.   . vitamin BMcintyre12 (CYANOCOBALAMIN) 1000 MCG tablet Take 1,000 mcg by mouth daily.  . Vitamin D, Cholecalciferol, 400 UNITS CAPS Take 400 Units by mouth daily.   No facilityMcintyreadministered encounter medications on file as of 09/29/2018.     Activities of Daily Living In your present state of health, do you have any difficulty performing the following activities: 09/29/2018 10/15/2017  Hearing? N N  Vision? N N  Difficulty concentrating or making decisions? N N  Walking or climbing stairs? Y N  Comment Right knee issues going down steps.  -  Dressing or bathing? N N  Doing errands, shopping? N N  Preparing Food and eating ? N -  Using the Toilet? N -  In the past six months, have you accidently leaked urine? N -  Do you have problems with loss of bowel control? Y -  Managing your Medications? N -  Managing your Finances? N -  Housekeeping or managing your Housekeeping? N -  Some recent data might be hidden    Patient Care Team: Leamon Arnt, MD as PCP - General (Family Medicine) Danie Binder, MD as Consulting Physician (Gastroenterology) Gala Romney Cristopher Estimable, MD as Consulting Physician (Gastroenterology) Minus Breeding, MD as Consulting Physician (Cardiology) Annitta Needs, NP (Gastroenterology) Carlis Stable, NP as Nurse Practitioner (Gastroenterology)    Assessment:   This is a routine wellness examination for Elizabeth Mcintyre.  Exercise Activities and Dietary recommendations Current Exercise Habits: Home exercise routine, Type of exercise: walking, Time (Minutes): 20, Frequency (Times/Week): 7, Weekly Exercise (Minutes/Week): 140, Exercise limited by: None identified   Diet (meal preparation, eat out, water intake, caffeinated beverages, dairy products, fruits and vegetables): Drinks water and coffee (1 cup).  Breakfast: cereal w/banana; fruit; egg, cheese; oatmeal Lunch: sandwich Dinner: protein and vegetables Snacks: fruit, chocolate bar  Goals    . Exercise 150 minutes per week (moderate activity)     Increase activity.     . Patient Stated     Volunteer more.        Fall Risk Fall Risk  09/29/2018 10/15/2017 03/13/2017 01/29/2017 05/25/2015  Falls in the past year? 0 No No No No    Depression Screen PHQ 2/9 Scores 09/29/2018 01/25/2018 10/15/2017 03/13/2017  PHQ - 2 Score _0 0  PHQ- 9 Score 13 6 - -     Cognitive Function MMSE - Mini Mental State Exam 09/29/2018 03/13/2017  Orientation to time 5 5  Orientation to Place 5 5  Registration 3 3  Attention/ Calculation 3 5  Recall 2 3  Language- name 2 objects 2 2  Language- repeat 1 1  Language- follow 3 step command 1 3  Language- read & follow direction 1 1  Write a sentence 1 1  Copy design 1 1  Total score 25 30        Immunization History  Administered Date(s) Administered  . Influenza Split 07/16/2011  . Influenza, High Dose Seasonal PF 10/15/2017, 09/03/2018  . Influenza,inj,Quad PF,6+ Mos 06/09/2014, 10/15/2015  . Pneumococcal ConjugateMcintyre13 03/13/2017  .  Pneumococcal PolysaccharideMcintyre23 10/27/2014  .  Tdap 07/16/2011     Screening Tests Health Maintenance  Topic Date Due  . MAMMOGRAM  05/04/2018  . DEXA SCAN  05/04/2018  . OPHTHALMOLOGY EXAM  08/29/2018  . FOOT EXAM  01/26/2019  . HEMOGLOBIN A1C  03/05/2019  . LIPID PANEL  09/04/2019  . TETANUS/TDAP  07/15/2021  . INFLUENZA VACCINE  Completed  . PNA vac Low Risk Adult  Completed        Plan:    Schedule mammogram and bone density after 05/06/2019.   Schedule eye exam.   Continue doing brain stimulating activities (puzzles, reading, adult coloring books, staying active) to keep memory sharp.   I have personally reviewed and noted the following in the patient's chart:   . Medical and social history . Use of alcohol, tobacco or illicit drugs  . Current medications and supplements . Functional ability and status . Nutritional status . Physical activity . Advanced directives . List of other physicians . Hospitalizations, surgeries, and ER visits in previous 12 months . Vitals . Screenings to include cognitive, depression, and falls . Referrals and appointments  In addition, I have reviewed and discussed with patient certain preventive protocols, quality metrics, and best practice recommendations. A written personalized care plan for preventive services as well as general preventive health recommendations were provided to patient.     Gerilyn Nestle, RN  09/29/2018   PCP Notes: -PHQ9=13 -Considering derm referral for dark spots on chest/legs.  -F/U with PCP 10/15/2018

## 2018-09-29 ENCOUNTER — Other Ambulatory Visit: Payer: Self-pay

## 2018-09-29 ENCOUNTER — Ambulatory Visit (INDEPENDENT_AMBULATORY_CARE_PROVIDER_SITE_OTHER): Payer: Medicare Other

## 2018-09-29 VITALS — BP 146/84 | HR 66 | Ht 61.0 in | Wt 150.5 lb

## 2018-09-29 DIAGNOSIS — K3189 Other diseases of stomach and duodenum: Secondary | ICD-10-CM | POA: Diagnosis not present

## 2018-09-29 DIAGNOSIS — K210 Gastro-esophageal reflux disease with esophagitis: Secondary | ICD-10-CM | POA: Diagnosis not present

## 2018-09-29 DIAGNOSIS — R197 Diarrhea, unspecified: Secondary | ICD-10-CM | POA: Diagnosis not present

## 2018-09-29 DIAGNOSIS — Z0000 Encounter for general adult medical examination without abnormal findings: Secondary | ICD-10-CM | POA: Diagnosis not present

## 2018-09-29 DIAGNOSIS — R1030 Lower abdominal pain, unspecified: Secondary | ICD-10-CM | POA: Diagnosis not present

## 2018-09-29 NOTE — Patient Instructions (Addendum)
Schedule mammogram and bone density after 05/06/2019.   Schedule eye exam.   Continue doing brain stimulating activities (puzzles, reading, adult coloring books, staying active) to keep memory sharp.   Health Maintenance, Female Adopting a healthy lifestyle and getting preventive care can go a long way to promote health and wellness. Talk with your health care provider about what schedule of regular examinations is right for you. This is a good chance for you to check in with your provider about disease prevention and staying healthy. In between checkups, there are plenty of things you can do on your own. Experts have done a lot of research about which lifestyle changes and preventive measures are most likely to keep you healthy. Ask your health care provider for more information. Weight and diet Eat a healthy diet  Be sure to include plenty of vegetables, fruits, low-fat dairy products, and lean protein.  Do not eat a lot of foods high in solid fats, added sugars, or salt.  Get regular exercise. This is one of the most important things you can do for your health. ? Most adults should exercise for at least 150 minutes each week. The exercise should increase your heart rate and make you sweat (moderate-intensity exercise). ? Most adults should also do strengthening exercises at least twice a week. This is in addition to the moderate-intensity exercise. Maintain a healthy weight  Body mass index (BMI) is a measurement that can be used to identify possible weight problems. It estimates body fat based on height and weight. Your health care provider can help determine your BMI and help you achieve or maintain a healthy weight.  For females 33 years of age and older: ? A BMI below 18.5 is considered underweight. ? A BMI of 18.5 to 24.9 is normal. ? A BMI of 25 to 29.9 is considered overweight. ? A BMI of 30 and above is considered obese. Watch levels of cholesterol and blood lipids  You  should start having your blood tested for lipids and cholesterol at 77 years of age, then have this test every 5 years.  You may need to have your cholesterol levels checked more often if: ? Your lipid or cholesterol levels are high. ? You are older than 77 years of age. ? You are at high risk for heart disease. Cancer screening Lung Cancer  Lung cancer screening is recommended for adults 74-16 years old who are at high risk for lung cancer because of a history of smoking.  A yearly low-dose CT scan of the lungs is recommended for people who: ? Currently smoke. ? Have quit within the past 15 years. ? Have at least a 30-pack-year history of smoking. A pack year is smoking an average of one pack of cigarettes a day for 1 year.  Yearly screening should continue until it has been 15 years since you quit.  Yearly screening should stop if you develop a health problem that would prevent you from having lung cancer treatment. Breast Cancer  Practice breast self-awareness. This means understanding how your breasts normally appear and feel.  It also means doing regular breast self-exams. Let your health care provider know about any changes, no matter how small.  If you are in your 20s or 30s, you should have a clinical breast exam (CBE) by a health care provider every 1-3 years as part of a regular health exam.  If you are 43 or older, have a CBE every year. Also consider having a breast X-ray (mammogram)  every year.  If you have a family history of breast cancer, talk to your health care provider about genetic screening.  If you are at high risk for breast cancer, talk to your health care provider about having an MRI and a mammogram every year.  Breast cancer gene (BRCA) assessment is recommended for women who have family members with BRCA-related cancers. BRCA-related cancers include: ? Breast. ? Ovarian. ? Tubal. ? Peritoneal cancers.  Results of the assessment will determine the need  for genetic counseling and BRCA1 and BRCA2 testing. Cervical Cancer Your health care provider may recommend that you be screened regularly for cancer of the pelvic organs (ovaries, uterus, and vagina). This screening involves a pelvic examination, including checking for microscopic changes to the surface of your cervix (Pap test). You may be encouraged to have this screening done every 3 years, beginning at age 52.  For women ages 11-65, health care providers may recommend pelvic exams and Pap testing every 3 years, or they may recommend the Pap and pelvic exam, combined with testing for human papilloma virus (HPV), every 5 years. Some types of HPV increase your risk of cervical cancer. Testing for HPV may also be done on women of any age with unclear Pap test results.  Other health care providers may not recommend any screening for nonpregnant women who are considered low risk for pelvic cancer and who do not have symptoms. Ask your health care provider if a screening pelvic exam is right for you.  If you have had past treatment for cervical cancer or a condition that could lead to cancer, you need Pap tests and screening for cancer for at least 20 years after your treatment. If Pap tests have been discontinued, your risk factors (such as having a new sexual partner) need to be reassessed to determine if screening should resume. Some women have medical problems that increase the chance of getting cervical cancer. In these cases, your health care provider may recommend more frequent screening and Pap tests. Colorectal Cancer  This type of cancer can be detected and often prevented.  Routine colorectal cancer screening usually begins at 77 years of age and continues through 77 years of age.  Your health care provider may recommend screening at an earlier age if you have risk factors for colon cancer.  Your health care provider may also recommend using home test kits to check for hidden blood in the  stool.  A small camera at the end of a tube can be used to examine your colon directly (sigmoidoscopy or colonoscopy). This is done to check for the earliest forms of colorectal cancer.  Routine screening usually begins at age 4.  Direct examination of the colon should be repeated every 5-10 years through 77 years of age. However, you may need to be screened more often if early forms of precancerous polyps or small growths are found. Skin Cancer  Check your skin from head to toe regularly.  Tell your health care provider about any new moles or changes in moles, especially if there is a change in a mole's shape or color.  Also tell your health care provider if you have a mole that is larger than the size of a pencil eraser.  Always use sunscreen. Apply sunscreen liberally and repeatedly throughout the day.  Protect yourself by wearing long sleeves, pants, a wide-brimmed hat, and sunglasses whenever you are outside. Heart disease, diabetes, and high blood pressure  High blood pressure causes heart disease and increases  the risk of stroke. High blood pressure is more likely to develop in: ? People who have blood pressure in the high end of the normal range (130-139/85-89 mm Hg). ? People who are overweight or obese. ? People who are African American.  If you are 77-12 years of age, have your blood pressure checked every 3-5 years. If you are 40 years of age or older, have your blood pressure checked every year. You should have your blood pressure measured twice-once when you are at a hospital or clinic, and once when you are not at a hospital or clinic. Record the average of the two measurements. To check your blood pressure when you are not at a hospital or clinic, you can use: ? An automated blood pressure machine at a pharmacy. ? A home blood pressure monitor.  If you are between 70 years and 79 years old, ask your health care provider if you should take aspirin to prevent  strokes.  Have regular diabetes screenings. This involves taking a blood sample to check your fasting blood sugar level. ? If you are at a normal weight and have a low risk for diabetes, have this test once every three years after 77 years of age. ? If you are overweight and have a high risk for diabetes, consider being tested at a younger age or more often. Preventing infection Hepatitis B  If you have a higher risk for hepatitis B, you should be screened for this virus. You are considered at high risk for hepatitis B if: ? You were born in a country where hepatitis B is common. Ask your health care provider which countries are considered high risk. ? Your parents were born in a high-risk country, and you have not been immunized against hepatitis B (hepatitis B vaccine). ? You have HIV or AIDS. ? You use needles to inject street drugs. ? You live with someone who has hepatitis B. ? You have had sex with someone who has hepatitis B. ? You get hemodialysis treatment. ? You take certain medicines for conditions, including cancer, organ transplantation, and autoimmune conditions. Hepatitis C  Blood testing is recommended for: ? Everyone born from [birth year] through 1964. ? Anyone with known risk factors for hepatitis C. Sexually transmitted infections (STIs)  You should be screened for sexually transmitted infections (STIs) including gonorrhea and chlamydia if: ? You are sexually active and are younger than 77 years of age. ? You are older than 77 years of age and your health care provider tells you that you are at risk for this type of infection. ? Your sexual activity has changed since you were last screened and you are at an increased risk for chlamydia or gonorrhea. Ask your health care provider if you are at risk.  If you do not have HIV, but are at risk, it may be recommended that you take a prescription medicine daily to prevent HIV infection. This is called pre-exposure prophylaxis  (PrEP). You are considered at risk if: ? You are sexually active and do not regularly use condoms or know the HIV status of your partner(s). ? You take drugs by injection. ? You are sexually active with a partner who has HIV. Talk with your health care provider about whether you are at high risk of being infected with HIV. If you choose to begin PrEP, you should first be tested for HIV. You should then be tested every 3 months for as long as you are taking PrEP. Pregnancy  If  you are premenopausal and you may become pregnant, ask your health care provider about preconception counseling.  If you may become pregnant, take 400 to 800 micrograms (mcg) of folic acid every day.  If you want to prevent pregnancy, talk to your health care provider about birth control (contraception). Osteoporosis and menopause  Osteoporosis is a disease in which the bones lose minerals and strength with aging. This can result in serious bone fractures. Your risk for osteoporosis can be identified using a bone density scan.  If you are 76 years of age or older, or if you are at risk for osteoporosis and fractures, ask your health care provider if you should be screened.  Ask your health care provider whether you should take a calcium or vitamin D supplement to lower your risk for osteoporosis.  Menopause may have certain physical symptoms and risks.  Hormone replacement therapy may reduce some of these symptoms and risks. Talk to your health care provider about whether hormone replacement therapy is right for you. Follow these instructions at home:  Schedule regular health, dental, and eye exams.  Stay current with your immunizations.  Do not use any tobacco products including cigarettes, chewing tobacco, or electronic cigarettes.  If you are pregnant, do not drink alcohol.  If you are breastfeeding, limit how much and how often you drink alcohol.  Limit alcohol intake to no more than 1 drink per day for  nonpregnant women. One drink equals 12 ounces of beer, 5 ounces of wine, or 1 ounces of hard liquor.  Do not use street drugs.  Do not share needles.  Ask your health care provider for help if you need support or information about quitting drugs.  Tell your health care provider if you often feel depressed.  Tell your health care provider if you have ever been abused or do not feel safe at home. This information is not intended to replace advice given to you by your health care provider. Make sure you discuss any questions you have with your health care provider. Document Released: 03/17/2011 Document Revised: 02/07/2016 Document Reviewed: 06/05/2015 Elsevier Interactive Patient Education  2019 [employer].

## 2018-09-29 NOTE — Progress Notes (Signed)
Reviewed AWV. + depression screen MMSE 25/30 Will address at f/u visit in 2 weeks.  10/15/2018

## 2018-09-30 LAB — GASTROINTESTINAL PATHOGEN PANEL PCR
C. DIFFICILE TOX A/B, PCR: NOT DETECTED
CAMPYLOBACTER, PCR: NOT DETECTED
Cryptosporidium, PCR: NOT DETECTED
E coli (ETEC) LT/ST PCR: NOT DETECTED
E coli (STEC) stx1/stx2, PCR: NOT DETECTED
E coli 0157, PCR: NOT DETECTED
GIARDIA LAMBLIA, PCR: NOT DETECTED
Norovirus, PCR: NOT DETECTED
ROTAVIRUS, PCR: NOT DETECTED
Salmonella, PCR: NOT DETECTED
Shigella, PCR: NOT DETECTED

## 2018-10-11 ENCOUNTER — Other Ambulatory Visit: Payer: Self-pay | Admitting: *Deleted

## 2018-10-11 MED ORDER — LOSARTAN POTASSIUM 100 MG PO TABS: 100.0000 mg | ORAL_TABLET | Freq: Every day | ORAL | 3 refills | Status: DC

## 2018-10-15 ENCOUNTER — Other Ambulatory Visit: Payer: Self-pay | Admitting: Family Medicine

## 2018-10-15 ENCOUNTER — Ambulatory Visit (INDEPENDENT_AMBULATORY_CARE_PROVIDER_SITE_OTHER): Payer: Medicare Other | Admitting: Family Medicine

## 2018-10-15 ENCOUNTER — Encounter: Payer: Self-pay | Admitting: Family Medicine

## 2018-10-15 ENCOUNTER — Other Ambulatory Visit: Payer: Self-pay

## 2018-10-15 VITALS — BP 134/66 | HR 74 | Temp 98.1°F | Resp 16 | Ht 61.0 in | Wt 149.0 lb

## 2018-10-15 DIAGNOSIS — R413 Other amnesia: Secondary | ICD-10-CM | POA: Diagnosis not present

## 2018-10-15 DIAGNOSIS — F339 Major depressive disorder, recurrent, unspecified: Secondary | ICD-10-CM

## 2018-10-15 DIAGNOSIS — E2839 Other primary ovarian failure: Secondary | ICD-10-CM

## 2018-10-15 MED ORDER — ESCITALOPRAM OXALATE 10 MG PO TABS: 10.0000 mg | ORAL_TABLET | Freq: Every day | ORAL | 2 refills | Status: DC

## 2018-10-15 NOTE — Progress Notes (Signed)
Subjective  CC:  Chief Complaint  Patient presents with  . Depression    She reports no improvement  AWV: 09/29/2018 MMSE 25/30 and + depression screen   HPI: Elizabeth Mcintyre is a 78 y.o. female who presents to the office today to address the problems listed above in the chief complaint.  Elizabeth Mcintyre had her AWV and her phq depression f/u score was worse. She admits to feeling down and melancholy for the last 8 years since she was widowed.  She has been on prozac since. Her mood is low and more apathetic than any thing. She admits to feeling like her life here no longer matters but is not actively suicidal. She would never harm herself due to her family and her fear of pain.   Memory concerns: sometimes feels she can't remember things. Mostly when she feels overwhelmed. MMSE was 25 compared to 30 the year prior.   Assessment  1. Major depression, recurrent, chronic ([place])   2. Memory problem      Plan   Depression, worsening:  Counseling done. She is safe. Will wean from prozac and change to lexapro with close follow up.   Memory: screen is nl. Will monitor. Believe more related to #1.   HM: schedule mammo and dexa. Pt to schedule eye exam  Follow up: Return in about 6 weeks (around 11/26/2018) for mood follow up.  Visit date not found  No orders of the defined types were placed in this encounter.  Meds ordered this encounter  Medications  . escitalopram (LEXAPRO) 10 MG tablet    Sig: Take 1 tablet (10 mg total) by mouth daily.    Dispense:  30 tablet    Refill:  2      I reviewed the patients updated PMH, FH, and SocHx.    Patient Active Problem List   Diagnosis Date Noted  . 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 Schatzki's ring     Priority: High  . Type 2 diabetes mellitus with complication ([place]) 38/25/0539    Priority: High  .  Acquired hypothyroidism 05/30/2014    Priority: High  . Major depression, recurrent, chronic ([place]) 04/03/2011    Priority: High  . Essential hypertension, benign 11/09/2007    Priority: High  . Lichen sclerosus et atrophicus of the vulva 10/15/2017    Priority: Medium  . Gastroesophageal reflux disease with esophagitis 03/06/2015    Priority: Medium  . Gout 06/09/2014    Priority: Medium  . Osteopenia 12/02/2013    Priority: Medium  . Mild intermittent asthma 11/09/2007    Priority: Medium  . Vitamin D deficiency 05/25/2015    Priority: Low  . Diarrhea 09/10/2018  . Abdominal pain 09/10/2018  . Carotid artery stenosis 06/13/2016   Current Meds  Medication Sig  . acetaminophen (TYLENOL) 325 MG tablet Take 650 mg by mouth every 6 (six) hours as needed for moderate pain.   Marland Kitchen albuterol (PROVENTIL HFA;VENTOLIN HFA) 108 (90 Base) MCG/ACT inhaler Inhale 2 puffs into the lungs every 6 (six) hours as needed for wheezing.  Marland Kitchen allopurinol (ZYLOPRIM) 100 MG tablet Take 1 tablet (100 mg total) by mouth daily. Needs office visit for further refills.  Marland Kitchen amLODipine (NORVASC) 10 MG tablet Take 1 tablet (10 mg total) by mouth daily.  . Blood Glucose Monitoring Suppl (ONE TOUCH ULTRA SYSTEM KIT)  W/DEVICE KIT 1 kit by Does not apply route once.  Marland Kitchen glucose blood (ONE TOUCH ULTRA TEST) test strip Use 1 test strip to test blood sugar levels 3 times daily. Dx. E11.9  . levothyroxine (SYNTHROID, LEVOTHROID) 75 MCG tablet Take 1 tablet (75 mcg total) by mouth daily.  Marland Kitchen losartan (COZAAR) 100 MG tablet Take 1 tablet (100 mg total) by mouth daily. Take 1/2 tablet daily  . ONETOUCH DELICA LANCETS FINE MISC Test twice daily.  . pantoprazole (PROTONIX) 40 MG tablet Take 1 tablet (40 mg total) by mouth daily.  . potassium chloride SA (K-DUR,KLOR-CON) 20 MEQ tablet Take 1 tablet (20 mEq total) by mouth daily.  . rosuvastatin (CRESTOR) 40 MG tablet TAKE 1 TABLET BY MOUTH EVERY DAY  . triamcinolone cream (KENALOG)  0.1 % Apply 1 application topically 2 (two) times daily. For 2-4 weeks as needed  . vitamin B-12 (CYANOCOBALAMIN) 1000 MCG tablet Take 1,000 mcg by mouth daily.  . Vitamin D, Cholecalciferol, 400 UNITS CAPS Take 400 Units by mouth daily.  . Wheat Dextrin (BENEFIBER) POWD Take 1 scoop by mouth every other day.   . [DISCONTINUED] FLUoxetine (PROZAC) 20 MG capsule TAKE 1 CAPSULE BY MOUTH EVERY DAY    Allergies: Patient is allergic to codeine phosphate. Family History: Patient family history includes Allergies in her daughter; Cancer in her paternal uncle; Diabetes in her paternal grandfather; Fibromyalgia in her daughter; Heart disease in her daughter, maternal grandfather, and mother; Hyperlipidemia in her sister; Osteoarthritis in her mother; Other in her daughter, daughter, and father; Pulmonary fibrosis in her maternal aunt and maternal aunt; Single kidney in her father; Sudden death in her father and paternal grandmother. Social History:  Patient  reports that she has never smoked. She has never used smokeless tobacco. She reports that she does not drink alcohol or use drugs.  Review of Systems: Constitutional: Negative for fever malaise or anorexia Cardiovascular: negative for chest pain Respiratory: negative for SOB or persistent cough Gastrointestinal: negative for abdominal pain  Objective  Vitals: BP 134/66   Pulse 74   Temp 98.1 F (36.7 C) (Oral)   Resp 16   Ht '5\' 1"'$  (1.549 m)   Wt 149 lb (67.6 kg)   SpO2 97%   BMI 28.15 kg/m  General: no acute distress , A&Ox3 Psych: hypokinetic, nl speech, good grooming, mildly flattened mood and affect    Commons side effects, risks, benefits, and alternatives for medications and treatment plan prescribed today were discussed, and the patient expressed understanding of the given instructions. Patient is instructed to call or message via MyChart if he/she has any questions or concerns regarding our treatment plan. No barriers to  understanding were identified. We discussed Red Flag symptoms and signs in detail. Patient expressed understanding regarding what to do in case of urgent or emergency type symptoms.   Medication list was reconciled, printed and provided to the patient in AVS. Patient instructions and summary information was reviewed with the patient as documented in the AVS. This note was prepared with assistance of Dragon voice recognition software. Occasional wrong-word or sound-a-like substitutions may have occurred due to the inherent limitations of voice recognition software

## 2018-10-15 NOTE — Patient Instructions (Addendum)
Please return in 6-8 weeks to recheck your mood on the new medication called escitalopram.   Please wean off the fluoxetine (prozac) as follows: Take one pill (20mg ) every other day for a week,  Then take 1 pill on M, W, F for a week, Then take 1 pill on  M and Th, then stop. This same week start the lexapro daily.   Mammogram 11/15/2018 arrie @ 10:10am for 10:30 appt Bone Density 05/09/2019 arrive @ 10:10 for a 10:30 appointment.   Please set up an appointment for a diabetic eye exam and have the results sent to me.   If you have any questions or concerns, please don't hesitate to send me a message via MyChart or call the office at [phone]. Thank you for visiting with [place] today! It's our pleasure caring for you.  We will continue the prozac for your depression.  We will keep an eye on your memory. We could start a memory medication as we discussed. As well, we could restart a medication for your bone health.  You can think about these things more and we will discuss again at your next visit.   Depression:  When you're going through tough times, it's easy to feel lonely and overwhelmed. Remember that YOU ARE NOT ALONE and we [place] [facility] want to support you during this difficult time.  There are many ways to get help and support when you are ready.  You can call the following help lines: - National suicide hotline 248 552 1143) - [place] (1-800-SUICIDE)   You can schedule an appointment with a provider here. We are all here to support you. A doctor is on call 24/7, so call if you need to. [phone]   There are many treatments that can help people during difficult times, and we can talk with you about medications, counseling, diet, and other options. We want to offer you HOPE that it won't always feel like this.

## 2018-11-15 ENCOUNTER — Ambulatory Visit
Admission: RE | Admit: 2018-11-15 | Discharge: 2018-11-15 | Disposition: A | Discharge status: Home / Self Care | Payer: Medicare Other | Source: Ambulatory Visit | Attending: Family Medicine | Admitting: Family Medicine

## 2018-11-15 DIAGNOSIS — Z1231 Encounter for screening mammogram for malignant neoplasm of breast: Secondary | ICD-10-CM | POA: Diagnosis not present

## 2018-11-15 DIAGNOSIS — R197 Diarrhea, unspecified: Secondary | ICD-10-CM

## 2018-11-15 IMAGING — MG DIGITAL SCREENING BILATERAL MAMMOGRAM WITH TOMO AND CAD
8 series · 9 of 24 positions shown · non-contrast
Comparison: Previous exam(s).

CLINICAL DATA: Screening.

EXAM:
DIGITAL SCREENING BILATERAL MAMMOGRAM WITH TOMO AND CAD

[R MLO synth-2D]
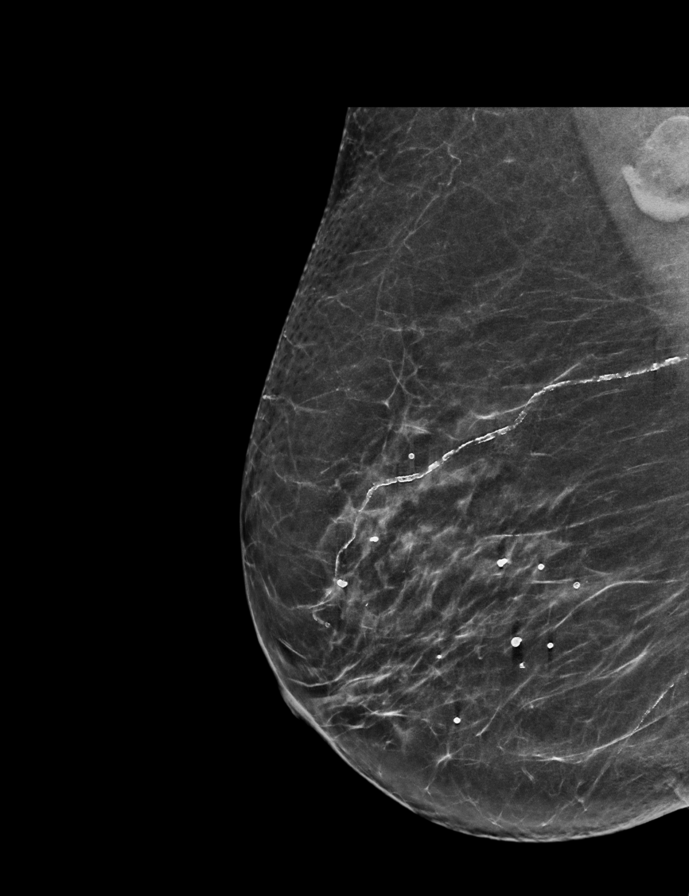

[L CC synth-2D]
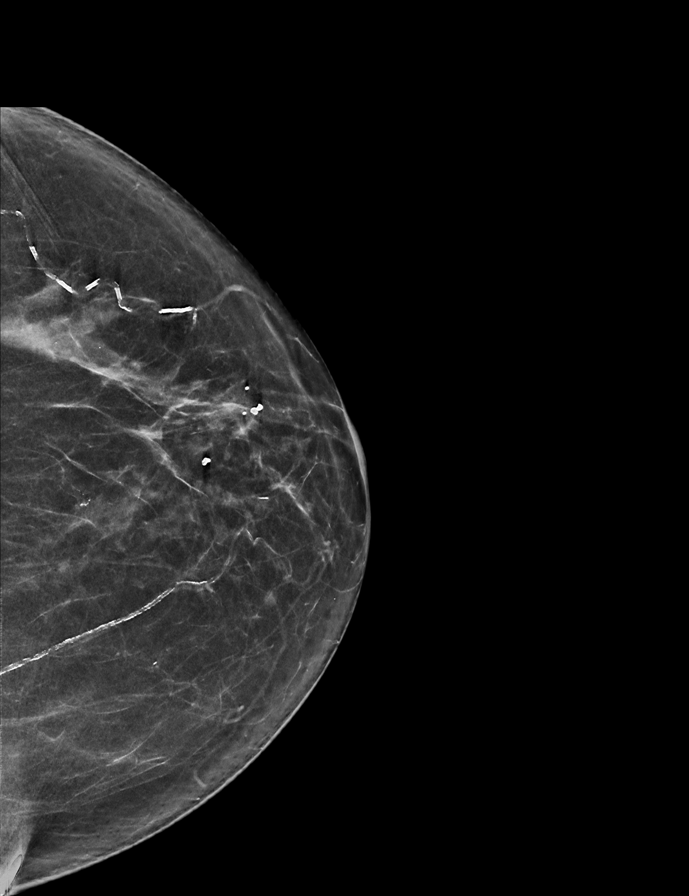

[R CC synth-2D]
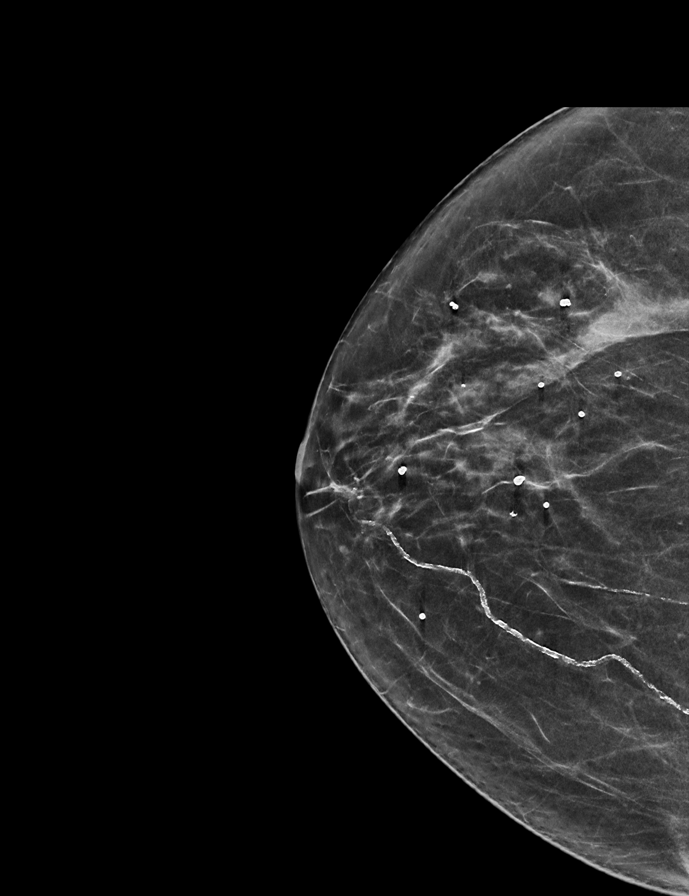

[L MLO synth-2D]
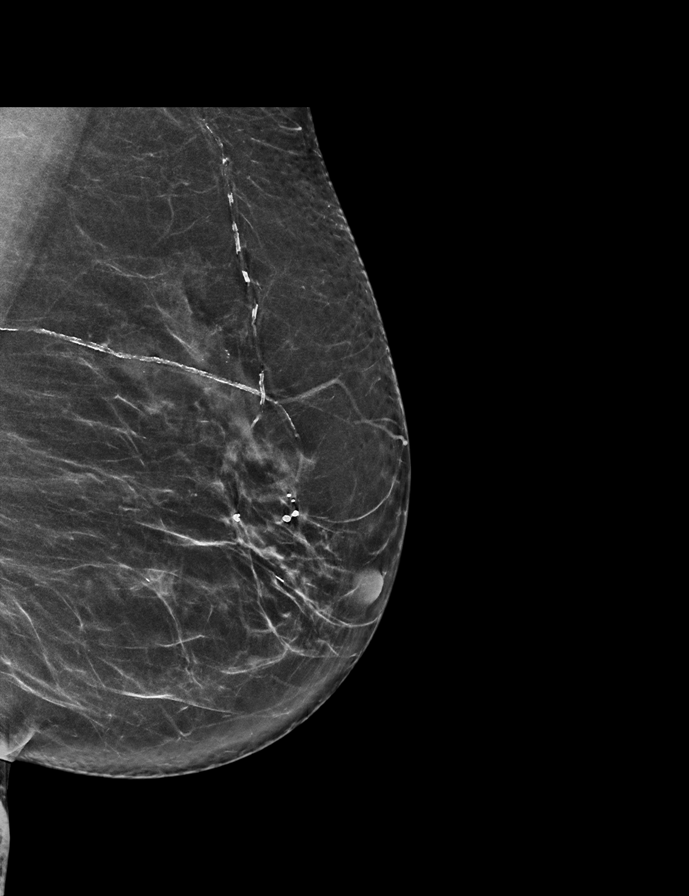

[L CC tomo · 2 of 61 frames shown]
[frame 20/61]
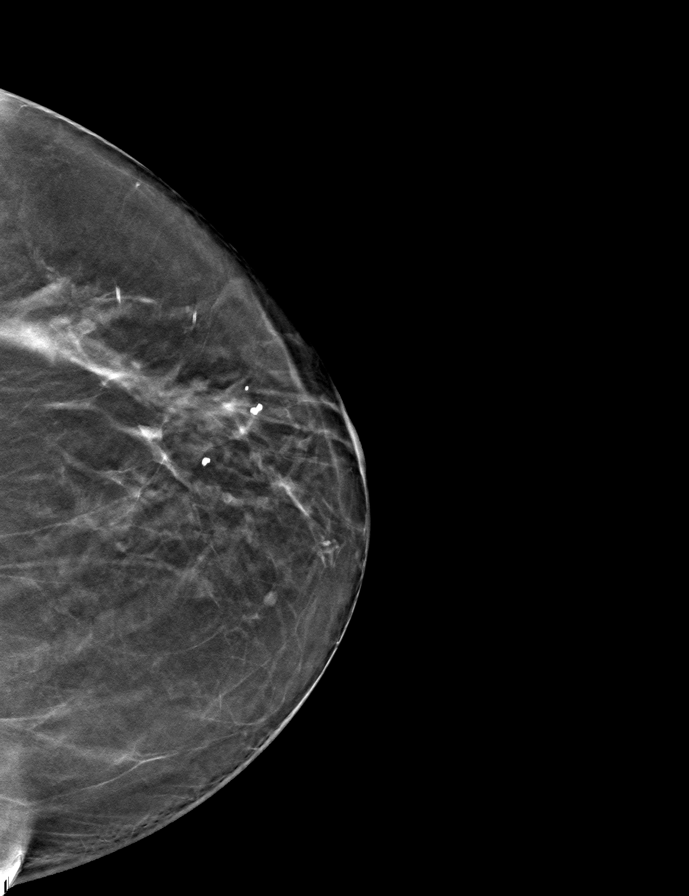
[frame 31/61]
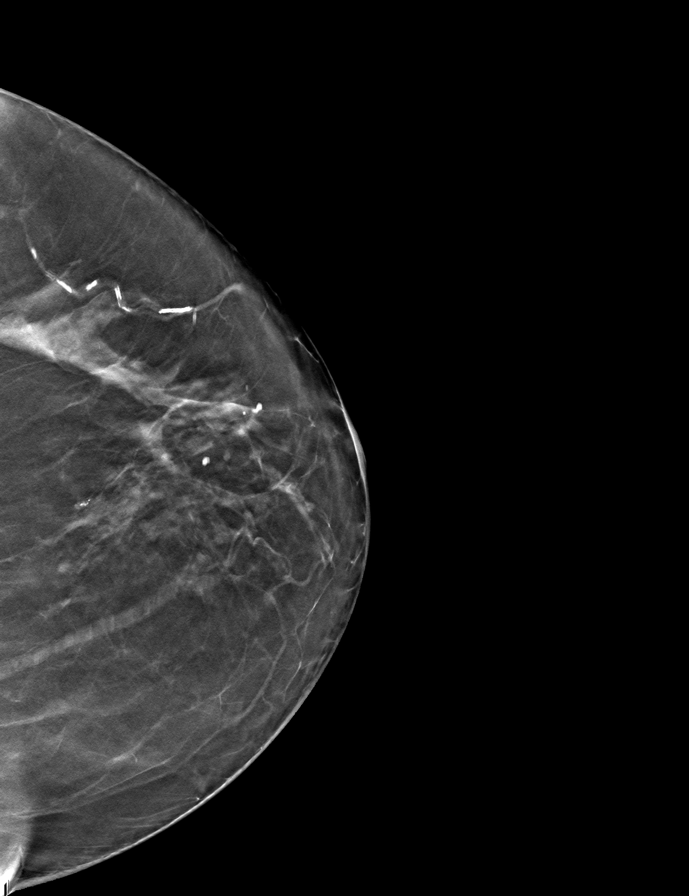

[R MLO tomo · tomo slice 34/67.0]
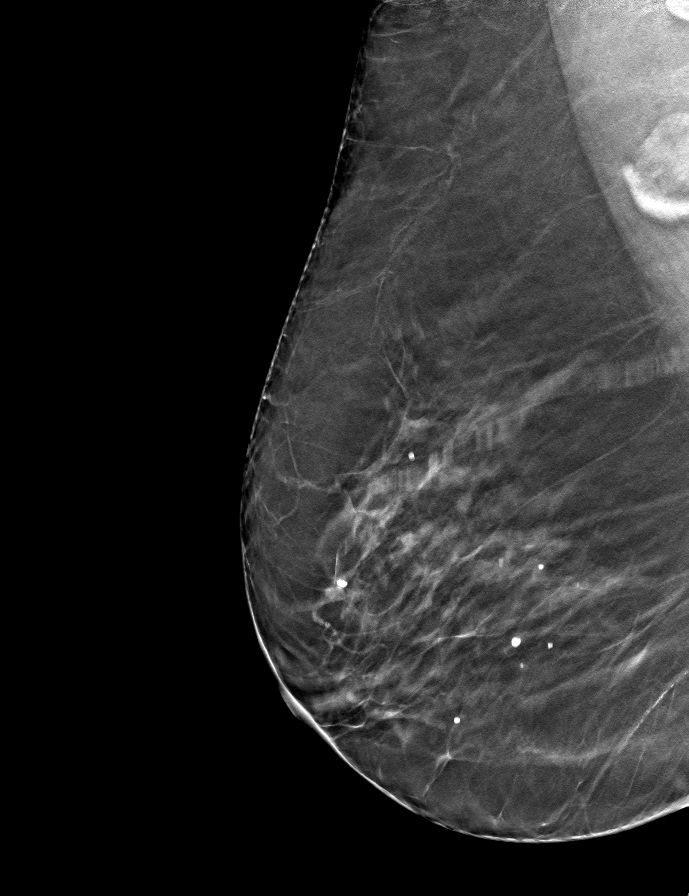

[L MLO tomo · tomo slice 33/65.0]
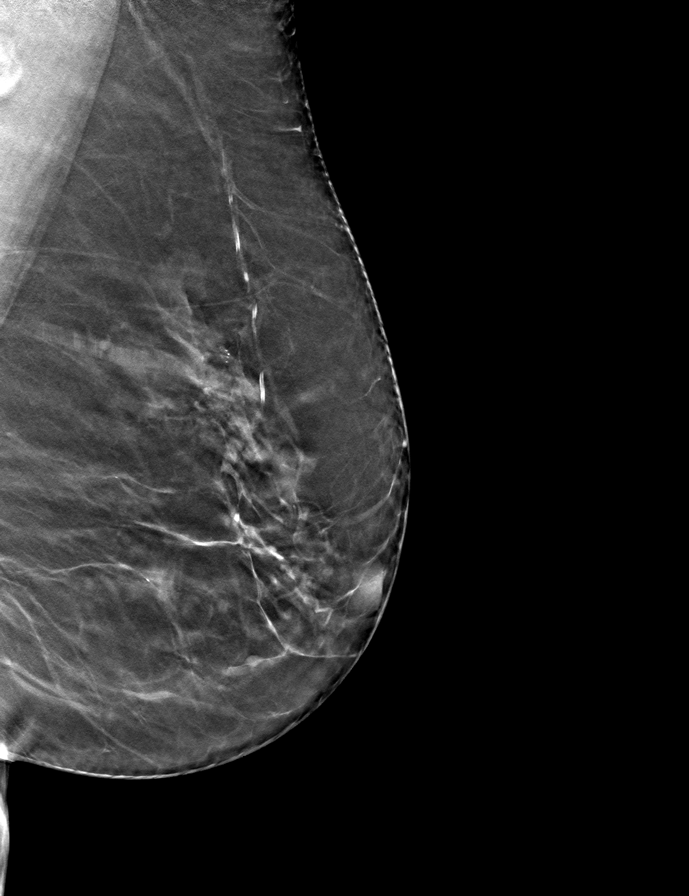

[R CC tomo · tomo slice 31/60.0]
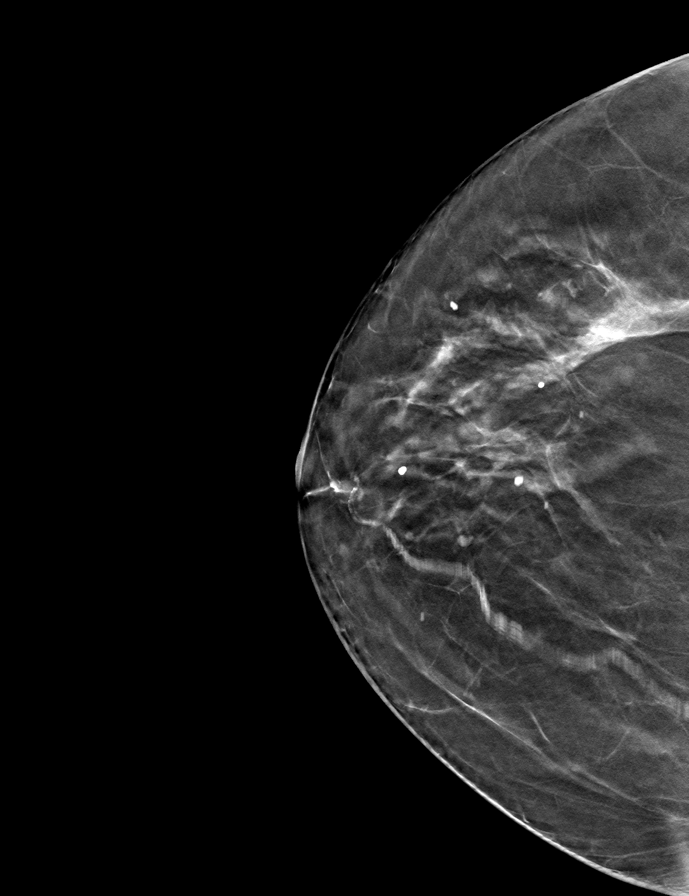

[9 of 24 positions shown; findings below may reference images not displayed]

ACR Breast Density Category c: The breast tissue is heterogeneously
dense, which may obscure small masses.
FINDINGS: In the left breast, calcifications warrant further evaluation. In
the right breast, no findings suspicious for malignancy. Images were
processed with CAD.
IMPRESSION: Further evaluation is suggested for calcifications in the left
breast.

RECOMMENDATION:
Diagnostic mammogram of the left breast. (Code:[B7])

The patient will be contacted regarding the findings, and additional
imaging will be scheduled.

BI-RADS CATEGORY  0: Incomplete. Need additional imaging evaluation
and/or prior mammograms for comparison.

## 2018-11-17 ENCOUNTER — Other Ambulatory Visit: Payer: Self-pay | Admitting: Family Medicine

## 2018-11-17 DIAGNOSIS — R928 Other abnormal and inconclusive findings on diagnostic imaging of breast: Secondary | ICD-10-CM

## 2018-11-19 ENCOUNTER — Other Ambulatory Visit: Payer: Self-pay | Admitting: Family Medicine

## 2018-11-19 ENCOUNTER — Ambulatory Visit
Admission: RE | Admit: 2018-11-19 | Discharge: 2018-11-19 | Disposition: A | Discharge status: Home / Self Care | Payer: Medicare Other | Source: Ambulatory Visit | Attending: Family Medicine | Admitting: Family Medicine

## 2018-11-19 DIAGNOSIS — R921 Mammographic calcification found on diagnostic imaging of breast: Secondary | ICD-10-CM

## 2018-11-19 DIAGNOSIS — R928 Other abnormal and inconclusive findings on diagnostic imaging of breast: Secondary | ICD-10-CM

## 2018-11-22 ENCOUNTER — Ambulatory Visit: Payer: Medicare Other | Admitting: Nurse Practitioner

## 2018-12-08 ENCOUNTER — Other Ambulatory Visit: Payer: Self-pay | Admitting: Nurse Practitioner

## 2018-12-08 DIAGNOSIS — R1030 Lower abdominal pain, unspecified: Secondary | ICD-10-CM

## 2018-12-08 DIAGNOSIS — R197 Diarrhea, unspecified: Secondary | ICD-10-CM

## 2018-12-08 DIAGNOSIS — K2100 Gastro-esophageal reflux disease with esophagitis, without bleeding: Secondary | ICD-10-CM

## 2018-12-29 ENCOUNTER — Other Ambulatory Visit: Payer: Self-pay | Admitting: Family Medicine

## 2018-12-29 NOTE — Telephone Encounter (Signed)
Please call to schedule a virtual visit: f/u depression and medication change.  No refill w/o visit. Thanks.

## 2018-12-30 ENCOUNTER — Encounter: Payer: Medicare Other | Admitting: Family Medicine

## 2018-12-31 ENCOUNTER — Encounter: Payer: Self-pay | Admitting: Family Medicine

## 2018-12-31 NOTE — Progress Notes (Signed)
Erroneous encoutner; disregard

## 2019-04-11 ENCOUNTER — Other Ambulatory Visit: Payer: Self-pay | Admitting: *Deleted

## 2019-04-11 MED ORDER — TRIAMCINOLONE ACETONIDE 0.1 % EX CREA: 1.0000 "application " | TOPICAL_CREAM | Freq: Two times a day (BID) | CUTANEOUS | 2 refills | Status: DC

## 2019-04-19 ENCOUNTER — Telehealth: Payer: Self-pay

## 2019-04-19 NOTE — Telephone Encounter (Signed)
Noted  

## 2019-04-19 NOTE — Telephone Encounter (Signed)
Spoke with patient.  States she started having low back pain 2 days ago.  Now she is having lower right-sided abdominal pain.  Hurts to press on her stomach.  Had some diarrhea yesterday that has now stopped.  No fever but does have "hot flashes".  Had 2 episodes of vomiting last night.  No chills.  She wonders if she has "some type of infection".  Does not want to go to ED.  She is scheduled for appointment for in office visit tomorrow at 1:20.  Please call patient to advise.

## 2019-04-20 ENCOUNTER — Encounter: Payer: Self-pay | Admitting: Family Medicine

## 2019-04-20 ENCOUNTER — Ambulatory Visit (INDEPENDENT_AMBULATORY_CARE_PROVIDER_SITE_OTHER): Payer: Medicare Other | Admitting: Family Medicine

## 2019-04-20 ENCOUNTER — Other Ambulatory Visit: Payer: Self-pay

## 2019-04-20 VITALS — BP 124/72 | HR 82 | Temp 98.3°F | Resp 16 | Ht 61.0 in | Wt 154.4 lb

## 2019-04-20 DIAGNOSIS — R109 Unspecified abdominal pain: Secondary | ICD-10-CM | POA: Diagnosis not present

## 2019-04-20 DIAGNOSIS — K2100 Gastro-esophageal reflux disease with esophagitis, without bleeding: Secondary | ICD-10-CM

## 2019-04-20 DIAGNOSIS — N183 Chronic kidney disease, stage 3 (moderate): Secondary | ICD-10-CM

## 2019-04-20 DIAGNOSIS — E118 Type 2 diabetes mellitus with unspecified complications: Secondary | ICD-10-CM

## 2019-04-20 DIAGNOSIS — R197 Diarrhea, unspecified: Secondary | ICD-10-CM

## 2019-04-20 DIAGNOSIS — B3731 Acute candidiasis of vulva and vagina: Secondary | ICD-10-CM

## 2019-04-20 DIAGNOSIS — E039 Hypothyroidism, unspecified: Secondary | ICD-10-CM

## 2019-04-20 DIAGNOSIS — B373 Candidiasis of vulva and vagina: Secondary | ICD-10-CM

## 2019-04-20 DIAGNOSIS — E1122 Type 2 diabetes mellitus with diabetic chronic kidney disease: Secondary | ICD-10-CM | POA: Diagnosis not present

## 2019-04-20 LAB — COMPREHENSIVE METABOLIC PANEL
ALT: 12 U/L (ref 0–35)
AST: 16 U/L (ref 0–37)
Albumin: 4.5 g/dL (ref 3.5–5.2)
Alkaline Phosphatase: 62 U/L (ref 39–117)
BUN: 21 mg/dL (ref 6–23)
CO2: 25 mEq/L (ref 19–32)
Calcium: 9.4 mg/dL (ref 8.4–10.5)
Chloride: 102 mEq/L (ref 96–112)
Creatinine, Ser: 1.38 mg/dL — ABNORMAL HIGH (ref 0.40–1.20)
GFR: 37.08 mL/min — ABNORMAL LOW (ref >60.00)
Glucose, Bld: 132 mg/dL — ABNORMAL HIGH (ref 70–99)
Potassium: 4.1 mEq/L (ref 3.5–5.1)
Sodium: 136 mEq/L (ref 135–145)
Total Bilirubin: 0.4 mg/dL (ref 0.2–1.2)
Total Protein: 6.9 g/dL (ref 6.0–8.3)

## 2019-04-20 LAB — CBC WITH DIFFERENTIAL/PLATELET
Basophils Absolute: 0.1 10*3/uL (ref 0.0–0.1)
Basophils Relative: 0.9 % (ref 0.0–3.0)
Eosinophils Absolute: 0.3 10*3/uL (ref 0.0–0.7)
Eosinophils Relative: 6 % — ABNORMAL HIGH (ref 0.0–5.0)
HCT: 36.5 % (ref 36.0–46.0)
Hemoglobin: 12.3 g/dL (ref 12.0–15.0)
Lymphocytes Relative: 25.5 % (ref 12.0–46.0)
Lymphs Abs: 1.5 10*3/uL (ref 0.7–4.0)
MCHC: 33.6 g/dL (ref 30.0–36.0)
MCV: 83.4 fl (ref 78.0–100.0)
Monocytes Absolute: 0.6 10*3/uL (ref 0.1–1.0)
Monocytes Relative: 10.3 % (ref 3.0–12.0)
Neutro Abs: 3.3 10*3/uL (ref 1.4–7.7)
Neutrophils Relative %: 57.3 % (ref 43.0–77.0)
Platelets: 155 10*3/uL (ref 150.0–400.0)
RBC: 4.38 Mil/uL (ref 3.87–5.11)
RDW: 14.4 % (ref 11.5–15.5)
WBC: 5.8 10*3/uL (ref 4.0–10.5)

## 2019-04-20 LAB — POCT GLYCOSYLATED HEMOGLOBIN (HGB A1C): Hemoglobin A1C: 6.5 % — AB (ref 4.0–5.6)

## 2019-04-20 LAB — TSH: TSH: 5.7 u[IU]/mL — ABNORMAL HIGH (ref 0.35–4.50)

## 2019-04-20 MED ORDER — FLUCONAZOLE 150 MG PO TABS: ORAL_TABLET | ORAL | 0 refills | Status: DC

## 2019-04-20 MED ORDER — DICYCLOMINE HCL 10 MG PO CAPS: 10.0000 mg | ORAL_CAPSULE | Freq: Three times a day (TID) | ORAL | 2 refills | Status: DC

## 2019-04-20 NOTE — Patient Instructions (Signed)
Please return in 4 weeks for recheck.  Please make an appointment with your stomach doctor for follow up on your intermittent abdominal pain and GERD.   If you have any questions or concerns, please don't hesitate to send me a message via MyChart or call the office at [phone]. Thank you for visiting with [place] today! It's our pleasure caring for you.

## 2019-04-20 NOTE — Progress Notes (Signed)
Subjective  CC:  Chief Complaint  Patient presents with  . Abdominal Pain    Started 2-3 weeks ago.. She states diarrhea is better, stomach is sore to the touch and tender on right side    HPI: Elizabeth Mcintyre is a 78 y.o. female who presents to the office today to address the problems listed above in the chief complaint.  78 yo who hasn't been in office to see me since January and who has long history of GI problems presents due to abdominal cramping and pain.   See note from GI 1/22019 that outlines her GI workup/problems to date.   Started with diarrhea, non-mucoid and non-bloody; that has resolved but still with upper abdominal "soreness" and cramping. Appetite is normal. BMs are now normal. Admits to intermittent diarrhea. On PPI for GERD. No n/v/. No f/c/. No lower abdominal pain. GI dxd with GERD and probable IBS but patient did not return for follow up. She denies recent abx.   DM - overdue for f/u. Says she feels fine w/o sxs of high or low blood sugars. She is diet controlled.   C/o vaginal white discharge with associated vulvar itching and swelling. No urinary sxs.    Assessment  1. Abdominal cramping   2. Acquired hypothyroidism   3. 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 Type 2 diabetes mellitus with complication (HCC)   5. Gastroesophageal reflux disease with esophagitis   6. Diarrhea, unspecified type   7. Yeast vaginitis      Plan   Recurrent abdominal cramping and diarrhea:  Mild ttp but non-acute abdomen. No red flags. C/w IBS and GERD. Add bentyl, check labs including gi path panel and rec f/u with GI for further mgt/education  DM: control is fair. Needs visit for management.   Check labs for DM, thyroid and abdominal pain  Treat for yeast .  Counseled/educated on need for timely f/u for chronic medical problems.   Follow up: needs dm visit   Visit date not found  Orders Placed This Encounter  Procedures  . CBC with Differential/Platelet  .  Comprehensive metabolic panel  . TSH  . Gastrointestinal Pathogen Panel PCR  . POCT glycosylated hemoglobin (Hb A1C)   Meds ordered this encounter  Medications  . fluconazole (DIFLUCAN) 150 MG tablet    Sig: Take one tablet today; may repeat in 3 days if symptoms persist    Dispense:  2 tablet    Refill:  0  . dicyclomine (BENTYL) 10 MG capsule    Sig: Take 1 capsule (10 mg total) by mouth 4 (four) times daily -  before meals and at bedtime. As needed for cramping    Dispense:  60 capsule    Refill:  2      I reviewed the patients updated PMH, FH, and SocHx.    Patient Active Problem List   Diagnosis Date Noted  . Combined hyperlipidemia associated with type 2 diabetes mellitus ([place]) 01/29/2017    Priority: High  . 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 Schatzki's ring     Priority: High  . Type 2 diabetes mellitus with complication ([place]) 03/49/1791    Priority: High  . Acquired hypothyroidism 05/30/2014    Priority: High  . Major depression, recurrent, chronic ([place]) 04/03/2011    Priority: High  . Essential hypertension, benign 11/09/2007    Priority: High  . Lichen sclerosus et atrophicus of the vulva  10/15/2017    Priority: Medium  . Gastroesophageal reflux disease with esophagitis 03/06/2015    Priority: Medium  . Gout 06/09/2014    Priority: Medium  . Osteopenia 12/02/2013    Priority: Medium  . Mild intermittent asthma 11/09/2007    Priority: Medium  . Vitamin D deficiency 05/25/2015    Priority: Low  . Diarrhea 09/10/2018  . Abdominal pain 09/10/2018  . Carotid artery stenosis 06/13/2016   Current Meds  Medication Sig  . acetaminophen (TYLENOL) 325 MG tablet Take 650 mg by mouth every 6 (six) hours as needed for moderate pain.   Marland Kitchen albuterol (PROVENTIL HFA;VENTOLIN HFA) 108 (90 Base) MCG/ACT inhaler Inhale 2 puffs into the lungs every 6 (six) hours as needed for wheezing.  Marland Kitchen amLODipine (NORVASC) 10 MG tablet Take 1 tablet  (10 mg total) by mouth daily.  Marland Kitchen escitalopram (LEXAPRO) 10 MG tablet Take 1 tablet (10 mg total) by mouth daily.  Marland Kitchen levothyroxine (SYNTHROID, LEVOTHROID) 75 MCG tablet Take 1 tablet (75 mcg total) by mouth daily.  . pantoprazole (PROTONIX) 40 MG tablet Take 1 tablet (40 mg total) by mouth daily before breakfast.  . rosuvastatin (CRESTOR) 40 MG tablet TAKE 1 TABLET BY MOUTH EVERY DAY  . triamcinolone cream (KENALOG) 0.1 % Apply 1 application topically 2 (two) times daily. For 2-4 weeks as needed  . vitamin B-12 (CYANOCOBALAMIN) 1000 MCG tablet Take 1,000 mcg by mouth daily.  . Vitamin D, Cholecalciferol, 400 UNITS CAPS Take 400 Units by mouth daily.  . [DISCONTINUED] potassium chloride SA (K-DUR,KLOR-CON) 20 MEQ tablet Take 1 tablet (20 mEq total) by mouth daily.  . [DISCONTINUED] Wheat Dextrin (BENEFIBER) POWD Take 1 scoop by mouth every other day.     Allergies: Patient is allergic to codeine phosphate. Family History: Patient family history includes Allergies in her daughter; Cancer in her paternal uncle; Diabetes in her paternal grandfather; Fibromyalgia in her daughter; Heart disease in her daughter, maternal grandfather, and mother; Hyperlipidemia in her sister; Osteoarthritis in her mother; Other in her daughter, daughter, and father; Pulmonary fibrosis in her maternal aunt and maternal aunt; Single kidney in her father; Sudden death in her father and paternal grandmother. Social History:  Patient  reports that she has never smoked. She has never used smokeless tobacco. She reports that she does not drink alcohol or use drugs.  Review of Systems: Constitutional: Negative for fever malaise or anorexia Cardiovascular: negative for chest pain Respiratory: negative for SOB or persistent cough Gastrointestinal: negative for abdominal pain  Objective  Vitals: BP 124/72   Pulse 82   Temp 98.3 F (36.8 C) (Tympanic)   Resp 16   Ht 5\' 1"  (1.549 m)   Wt 154 lb 6.4 oz (70 kg)   SpO2 98%    BMI 29.17 kg/m  General: no acute distress , A&Ox3 HEENT: PEERL, conjunctiva normal, Oropharynx moist,neck is supple Cardiovascular:  RRR without murmur or gallop.  Respiratory:  Good breath sounds bilaterally, CTAB with normal respiratory effort Skin:  Warm, no rashes Gastrointestinal: soft, flat abdomen, normal active bowel sounds, no palpable masses, no hepatosplenomegaly, no appreciated hernias. ttp w/o rebound or guarding midepigastrium and upper quadrants. No lower quadrant ttp.  Lab Results  Component Value Date   HGBA1C 6.5 (A) 04/20/2019   HGBA1C 6.3 (A) 09/03/2018   HGBA1C 6.0 01/25/2018       Commons side effects, risks, benefits, and alternatives for medications and treatment plan prescribed today were discussed, and the patient expressed understanding of the given instructions.  Patient is instructed to call or message via MyChart if he/she has any questions or concerns regarding our treatment plan. No barriers to understanding were identified. We discussed Red Flag symptoms and signs in detail. Patient expressed understanding regarding what to do in case of urgent or emergency type symptoms.   Medication list was reconciled, printed and provided to the patient in AVS. Patient instructions and summary information was reviewed with the patient as documented in the AVS. This note was prepared with assistance of Dragon voice recognition software. Occasional wrong-word or sound-a-like substitutions may have occurred due to the inherent limitations of voice recognition software

## 2019-04-22 ENCOUNTER — Ambulatory Visit: Payer: Medicare Other | Admitting: Family Medicine

## 2019-04-26 ENCOUNTER — Encounter: Payer: Self-pay | Admitting: Nurse Practitioner

## 2019-04-26 ENCOUNTER — Other Ambulatory Visit: Payer: Self-pay

## 2019-04-26 ENCOUNTER — Encounter: Payer: Self-pay | Admitting: *Deleted

## 2019-04-26 ENCOUNTER — Ambulatory Visit: Payer: Medicare Other | Admitting: Nurse Practitioner

## 2019-04-26 VITALS — BP 120/80 | HR 84 | Temp 97.1°F | Ht 62.0 in | Wt 151.8 lb

## 2019-04-26 DIAGNOSIS — R1030 Lower abdominal pain, unspecified: Secondary | ICD-10-CM | POA: Diagnosis not present

## 2019-04-26 DIAGNOSIS — R140 Abdominal distension (gaseous): Secondary | ICD-10-CM

## 2019-04-26 DIAGNOSIS — R197 Diarrhea, unspecified: Secondary | ICD-10-CM | POA: Diagnosis not present

## 2019-04-26 HISTORY — DX: Abdominal distension (gaseous): R14.0

## 2019-04-26 NOTE — Assessment & Plan Note (Signed)
Diarrhea is improved since starting probiotic.  She does have about about 1 or 2 times a week but is much less significant.  She can usually tell when it is going to happen.  She does still have persistent abdominal pain as per below.  Recommend she try Bentyl up to 3 times daily as needed for abdominal pain or diarrhea.  Follow-up in 2 months.  Notify us of any worsening or severe diarrhea symptoms.

## 2019-04-26 NOTE — Patient Instructions (Signed)
Your health issues we discussed today were:   Diarrhea: 1. I am glad your diarrhea is improving 2. If you do feel like you are going to have a bout of diarrhea you can try taking a dicyclomine (Bentyl) to see if this helps hold off with diarrhea 3. Call us for any severe or worsening symptoms  Abdominal pain and bloating: 1. As we discussed, I feel your reflux/heartburn is under control 2. Your diarrhea has also improved 3. Given that you are having persistent abdominal pain quite frequently despite improvement in other areas I will put in for a celiac panel labs to check for gluten sensitivity 4. Also, try to avoid dairy.  Specifically, avoid ice cream for the next 2 to 4 weeks to see if this makes a difference in your symptoms 5. I will also order a CT of your abdomen and pelvis to evaluate for any concerning findings 6. You can also take dicyclomine (Bentyl) as needed for abdominal pain.  He can take this up to 3 times a day as needed 7. Call us for any worsening or severe symptoms  Overall I recommend:  1. Continue your other current medications 2. Follow-up in 2 months 3. Call us if you have any questions or concerns.   Because of recent events of COVID-19 ("Coronavirus"), follow CDC recommendations:  1. Wash your hand frequently 2. Avoid touching your face 3. Stay away from people who are sick 4. If you have symptoms such as fever, cough, shortness of breath then call your healthcare provider for further guidance 5. If you are sick, STAY AT HOME unless otherwise directed by your healthcare provider. 6. Follow directions from state and national officials regarding staying safe   At [facility] we value your feedback. You may receive a survey about your visit today. Please share your experience as we strive to create trusting relationships with our patients to provide genuine, compassionate, quality care.  We appreciate your understanding and patience as we  review any laboratory studies, imaging, and other diagnostic tests that are ordered as we care for you. Our office policy is 5 business days for review of these results, and any emergent or urgent results are addressed in a timely manner for your best interest. If you do not hear from our office in 1 week, please contact us.   We also encourage the use of MyChart, which contains your medical information for your review as well. If you are not enrolled in this feature, an access code is on this after visit summary for your convenience. Thank you for allowing [place] to be involved in your care.  It was great to see you today!  I hope you have a great summer!!

## 2019-04-26 NOTE — Progress Notes (Signed)
Referring Provider: Leamon Arnt, MD Primary Care Physician:  Leamon Arnt, MD Primary GI:  Dr. Oneida Alar  Chief Complaint  Patient presents with  . Abdominal Pain  . abdominal swelling  . Diarrhea    "not as bad as it was"    HPI:   Elizabeth Mcintyre is a 78 y.o. female who presents for follow-up.  Patient was last seen in our office 07/11/2018 for GERD, diarrhea, lower abdominal pain.  Colonoscopy up-to-date next due in 2022.  HFP 02/19/2017 was normal, HIDA scan completed 03/04/2017 with diminished gallbladder ejection fraction of 16% (normal is 33% or more) which suggests gallbladder dysfunction and recommended referral to surgery.  She underwent laparoscopic cholecystectomy 09/17/7251 with uncomplicated postop period.  She has been referred back to our office because of diarrhea and other chronic conditions with known history of diabetes with good control.  Diarrhea was essentially improved and stable.  At her last visit she notes diarrhea started 4 months prior and is intermittent, occurring weekly and lasting a day or 2 with improved volume.  When she does have diarrhea will have 10 times a day, random onset.  Abdominal pain when her diarrhea occurs sometimes, other times no associated abdominal pain.  Generally when she does have abdominal pain it is lower abdomen, crampy and sometimes sharp and does improve with a bowel movement.  States she was having chest pain and dyspnea on exertion and her primary care recommended following up with GI for this.  Recent ER visit which had negative cardiac work-up and negative C. difficile quick scan.  Denies overt GERD symptoms, not currently on Protonix that she was previously.  No other GI symptoms.  She does have a history of hypothyroidism and is on Synthroid.  Recommended stool studies, possible medication for symptoms depending on results, daily probiotic for the next 2 to 3 months, restart Protonix, follow-up in 2 to 3 months.  The patient's  GI pathogen panel was negative.  We attempted to contact the patient multiple times to see if she was still having symptoms and her voicemail was full.  We left several messages and and sent a my chart message.  No further communication from the patient.  PCP labs completed 02/02/2019 which found essentially normal CBC, essentially stable kidney function and CMP otherwise normal, elevated TSH for which her Synthroid dose was adjusted, hemoglobin A1c stable at 6.5.  Today she states she's doing well overall. Diarrhea improved, still on probiotic. Still with diarrhea but about 1-2 times a week. Less frequent. Can tell when it's going to happen. Still with some generalized abdominal pain described a "just a hurt." In the evenings she will have abdominal bloating. "Feels like something is catching" when she bends forward. Pain doesn't improve with a bowel movement. Denies hematochezia, melena. No known triggers of her pain. Stopped coffee and sodas which has helped some, but not significantly. Minimal GERD symptoms. Sometimes has regurgitation of iced cream. No other significant dairy intake. Denies fever, chills, unintentional weight loss. Denies URI or flu-like symptoms. Denies loss of sense of taste or smell. Denies chest pain, dyspnea, dizziness, lightheadedness, syncope, near syncope. Denies any other upper or lower GI symptoms.  She was recently given Bentyl by PCP but hasn't started that until she saw [place].  Past Medical History:  Diagnosis Date  . Asthma   . 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 DIABETES MELLITUS, TYPE II  11/09/2007   diet control  . Diverticulosis 03/2011  . GERD (gastroesophageal reflux disease)   . GI bleed   . History of rheumatoid arthritis    during 30's, was treated.  . Hyperlipidemia   . Hypertension   . Osteopenia 2017   Last  bone density 05/04/2017: -2.4  . PONV (postoperative nausea and vomiting)   . Stress incontinence      Past Surgical History:  Procedure Laterality Date  . ABDOMINAL HYSTERECTOMY    . CARDIAC CATHETERIZATION     X 2, last one in 1998  . CHOLECYSTECTOMY N/A 04/13/2017   Procedure: LAPAROSCOPIC CHOLECYSTECTOMY;  Surgeon: Aviva Signs, MD;  Location: [facility];  Service: General;  Laterality: N/A;  . COLONOSCOPY    . COLONOSCOPY  May 2012   Dr. Olevia Perches: mild diverticulosis, otherwise normal.   . ESOPHAGOGASTRODUODENOSCOPY N/A 01/28/2015   Dr. Gala Romney: reflux esophagitis, Schatzki's ring not manipulated due to recent bleeding  . ESOPHAGOGASTRODUODENOSCOPY N/A 03/30/2015   Dr. Gala Romney: Schatzki's ring s/p Venia Minks dilation, previously noted esophageal ulcer completely healed  . MALONEY DILATION N/A 03/30/2015   Procedure: Venia Minks DILATION;  Surgeon: Daneil Dolin, MD;  Location: [facility] SUITE;  Service: Endoscopy;  Laterality: N/A;    Current Outpatient Medications  Medication Sig Dispense Refill  . acetaminophen (TYLENOL) 325 MG tablet Take 650 mg by mouth every 6 (six) hours as needed for moderate pain.     Marland Kitchen albuterol (PROVENTIL HFA;VENTOLIN HFA) 108 (90 Base) MCG/ACT inhaler Inhale 2 puffs into the lungs every 6 (six) hours as needed for wheezing. 8.5 g 2  . amLODipine (NORVASC) 10 MG tablet Take 1 tablet (10 mg total) by mouth daily. 90 tablet 3  . Blood Glucose Monitoring Suppl (ONE TOUCH ULTRA SYSTEM KIT) W/DEVICE KIT 1 kit by Does not apply route once. 1 each 0  . dicyclomine (BENTYL) 10 MG capsule Take 1 capsule (10 mg total) by mouth 4 (four) times daily -  before meals and at bedtime. As needed for cramping 60 capsule 2  . escitalopram (LEXAPRO) 10 MG tablet Take 1 tablet (10 mg total) by mouth daily. 30 tablet 2  . fluconazole (DIFLUCAN) 150 MG tablet Take one tablet today; may repeat in 3 days if symptoms persist 2 tablet 0  . glucose blood (ONE TOUCH ULTRA TEST) test strip Use 1 test strip to test blood sugar levels 3 times daily. Dx. E11.9 100 each 12  . levothyroxine (SYNTHROID,  LEVOTHROID) 75 MCG tablet Take 1 tablet (75 mcg total) by mouth daily. 90 tablet 3  . losartan (COZAAR) 100 MG tablet Take 1 tablet (100 mg total) by mouth daily. Take 1/2 tablet daily 45 tablet 3  . ONETOUCH DELICA LANCETS FINE MISC Test twice daily. 180 each 2  . pantoprazole (PROTONIX) 40 MG tablet Take 1 tablet (40 mg total) by mouth daily before breakfast. 90 tablet 3  . rosuvastatin (CRESTOR) 40 MG tablet TAKE 1 TABLET BY MOUTH EVERY DAY 90 tablet 3  . triamcinolone cream (KENALOG) 0.1 % Apply 1 application topically 2 (two) times daily. For 2-4 weeks as needed 45 g 2  . vitamin B-12 (CYANOCOBALAMIN) 1000 MCG tablet Take 1,000 mcg by mouth daily.    . Vitamin D, Cholecalciferol, 400 UNITS CAPS Take 400 Units by mouth daily.     No current facility-administered medications for this visit.     Allergies as of 04/26/2019 - Review Complete 04/26/2019  Allergen Reaction Noted  . Codeine phosphate Nausea And Vomiting and  Rash     Family History  Problem Relation Age of Onset  . Heart disease Mother   . Osteoarthritis Mother   . Sudden death Father   . Single kidney Father   . Other Father        h/o severe MVA injuries  . Hyperlipidemia Sister   . Other Daughter        Myalgias  . Fibromyalgia Daughter   . Allergies Daughter   . Heart disease Maternal Grandfather   . Sudden death Paternal Grandmother   . Diabetes Paternal Grandfather   . Heart disease Daughter   . Other Daughter        palpitations  . Pulmonary fibrosis Maternal Aunt   . Cancer Paternal Uncle   . Pulmonary fibrosis Maternal Aunt   . Colon cancer Neg Hx     Social History   Socioeconomic History  . Marital status: Widowed    Spouse name: Not on file  . Number of children: 2  . Years of education: Not on file  . Highest education level: Not on file  Occupational History  . Occupation: retired  Scientific laboratory technician  . Financial resource strain: Not on file  . Food insecurity    Worry: Not on file     Inability: Not on file  . Transportation needs    Medical: Not on file    Non-medical: Not on file  Tobacco Use  . Smoking status: Never Smoker  . Smokeless tobacco: Never Used  Substance and Sexual Activity  . Alcohol use: No  . Drug use: No  . Sexual activity: Never  Lifestyle  . Physical activity    Days per week: Not on file    Minutes per session: Not on file  . Stress: Not on file  Relationships  . Social Herbalist on phone: Not on file    Gets together: Not on file    Attends religious service: Not on file    Active member of club or organization: Not on file    Attends meetings of clubs or organizations: Not on file    Relationship status: Not on file  Other Topics Concern  . Not on file  Social History Narrative   Ms. Rumpf is widowed. Her young grandson lives with her, for whom she shares custody with her daughter, the son's aunt.      Review of Systems: Complete ROS negative except as per HPI.   Physical Exam: BP 120/80   Pulse 84   Temp (!) 97.1 F (36.2 C) (Temporal)   Ht '5\' 2"'$  (1.575 m)   Wt 151 lb 12.8 oz (68.9 kg)   BMI 27.76 kg/m  General:   Alert and oriented. Pleasant and cooperative. Well-nourished and well-developed.  Eyes:  Without icterus, sclera clear and conjunctiva pink.  Ears:  Normal auditory acuity. Cardiovascular:  S1, S2 present without murmurs appreciated. Extremities without clubbing or edema. Respiratory:  Clear to auscultation bilaterally. No wheezes, rales, or rhonchi. No distress.  Gastrointestinal:  +BS, soft, non-tender and non-distended. No HSM noted. No guarding or rebound. No masses appreciated.  Rectal:  Deferred  Musculoskalatal:  Symmetrical without gross deformities. Neurologic:  Alert and oriented x4;  grossly normal neurologically. Psych:  Alert and cooperative. Normal mood and affect. Heme/Lymph/Immune: No excessive bruising noted.    04/26/2019 10:16 AM   Disclaimer: This note was dictated with  voice recognition software. Similar sounding words can inadvertently be transcribed and may not be corrected  upon review.

## 2019-04-26 NOTE — Assessment & Plan Note (Signed)
Overall the patient is somewhat improved.  She is on a probiotic.  She currently describes abdominal swelling/bloating later in the evenings.  Her diarrhea is improved, abdominal pain is persistent.  She does note that when she eats ice cream, which is essentially the only dairy she eats, she has some regurgitation and uncomfortable symptoms.  At this point I recommend she avoid dairy for possible lactose intolerance.  Given her constellation of symptoms also check a celiac panel for gluten sensitivity.  Follow-up in 2 months.  CT of the abdomen and pelvis given persistent symptoms, as per below.

## 2019-04-26 NOTE — Assessment & Plan Note (Signed)
The patient does still have some persistent abdominal pain.  Only known trigger is when she bends forward.  Her pain does not improve with a bowel movement decreasing the likelihood of irritable bowel syndrome.  No hematochezia or melena.  She stopped coffee and sodas to see if this would help but it has had a minimal effect.  As of right now her GERD symptoms are better controlled.  Her diarrhea is improved.  We are checking celiac panel for possible gluten sensitivity.  Given her persistent, unrelenting symptoms I will check a CT of the abdomen and pelvis.  She has not had recent abdominal imaging.  Follow-up in 2 months.

## 2019-04-27 NOTE — Progress Notes (Signed)
CC'D TO PCP °

## 2019-04-28 LAB — CELIAC DISEASE PANEL
(tTG) Ab, IgA: 1 U/mL
(tTG) Ab, IgG: 1 U/mL
Gliadin IgA: 7 Units
Gliadin IgG: 2 Units
Immunoglobulin A: 120 mg/dL (ref 70–320)

## 2019-04-30 ENCOUNTER — Other Ambulatory Visit: Payer: Self-pay | Admitting: Family Medicine

## 2019-05-06 ENCOUNTER — Telehealth: Payer: Self-pay

## 2019-05-06 NOTE — Telephone Encounter (Signed)
Copied from [place] [id number]. Topic: Appointment Scheduling - Transfer of Care >> May 06, 2019  1:31 PM Percell Belt A wrote: Pt is requesting to transfer FROM: Dr Jonni Sanger Pt is requesting to transfer TO: Dr Adelina Mings  Reason for requested transfer: due to location  Best number [phone]   Send CRM to patient's current PCP (transferring FROM).

## 2019-05-09 ENCOUNTER — Ambulatory Visit
Admission: RE | Admit: 2019-05-09 | Discharge: 2019-05-09 | Disposition: A | Discharge status: Home / Self Care | Payer: Medicare Other | Source: Ambulatory Visit | Attending: Family Medicine | Admitting: Family Medicine

## 2019-05-09 ENCOUNTER — Other Ambulatory Visit: Payer: Self-pay

## 2019-05-09 DIAGNOSIS — M8589 Other specified disorders of bone density and structure, multiple sites: Secondary | ICD-10-CM | POA: Diagnosis not present

## 2019-05-09 DIAGNOSIS — E2839 Other primary ovarian failure: Secondary | ICD-10-CM

## 2019-05-09 DIAGNOSIS — Z780 Asymptomatic menopausal state: Secondary | ICD-10-CM | POA: Diagnosis not present

## 2019-05-09 NOTE — Telephone Encounter (Signed)
Ok with me 

## 2019-05-10 ENCOUNTER — Ambulatory Visit (HOSPITAL_COMMUNITY)
Admission: RE | Admit: 2019-05-10 | Discharge: 2019-05-10 | Disposition: A | Discharge status: Home / Self Care | Payer: Medicare Other | Source: Ambulatory Visit | Attending: Nurse Practitioner | Admitting: Nurse Practitioner

## 2019-05-10 DIAGNOSIS — R140 Abdominal distension (gaseous): Secondary | ICD-10-CM

## 2019-05-10 DIAGNOSIS — R197 Diarrhea, unspecified: Secondary | ICD-10-CM

## 2019-05-10 DIAGNOSIS — K5641 Fecal impaction: Secondary | ICD-10-CM | POA: Diagnosis not present

## 2019-05-10 DIAGNOSIS — R1030 Lower abdominal pain, unspecified: Secondary | ICD-10-CM | POA: Diagnosis not present

## 2019-05-10 IMAGING — CT CT ABDOMEN AND PELVIS WITH CONTRAST
2 of 5 series · 16 of 46 positions shown, 18 images · IV contrast (Isovue)
Comparison: [DATE]

CLINICAL DATA: Abdominal pain, diarrhea, poorly

EXAM:
CT ABDOMEN AND PELVIS WITH CONTRAST
TECHNIQUE: Multidetector CT imaging of the abdomen and pelvis was performed
using the standard protocol following bolus administration of
intravenous contrast.
CONTRAST:  100mL OMNIPAQUE IOHEXOL 300 MG/ML SOLN, additional oral
enteric contrast

[Series 2: axial st · axial · 0.69mm/px · z∈[+525,+945]mm · 13 of 98 slices shown, 15 images]
[im 7/98  soft-tissue]
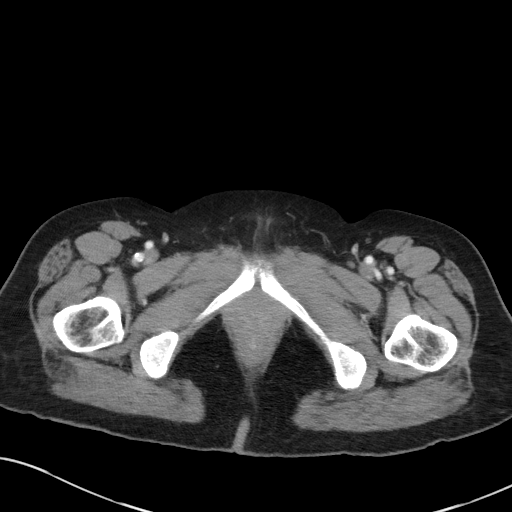
[im 7/98  bone]
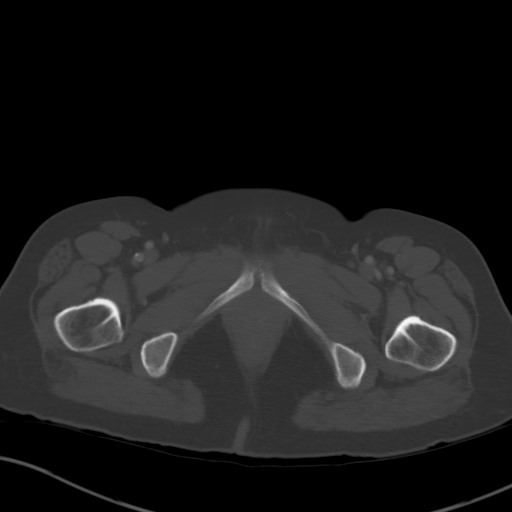
[im 13/98  soft-tissue]
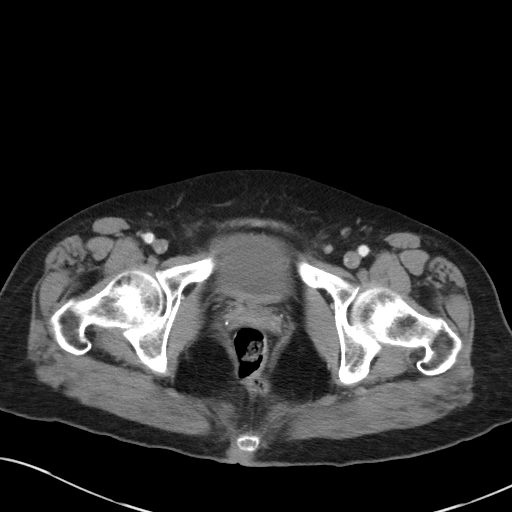
[im 19/98  soft-tissue]
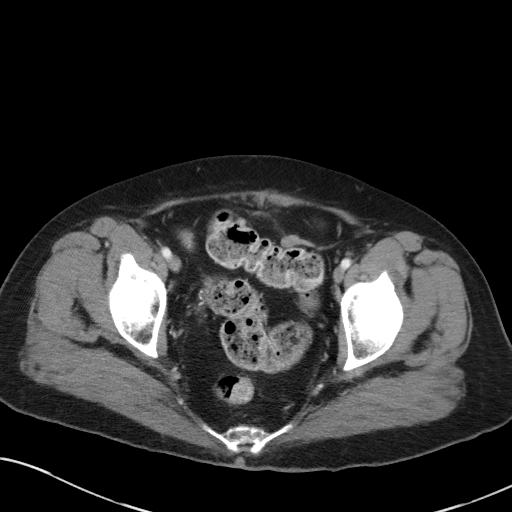
[im 31/98  soft-tissue]
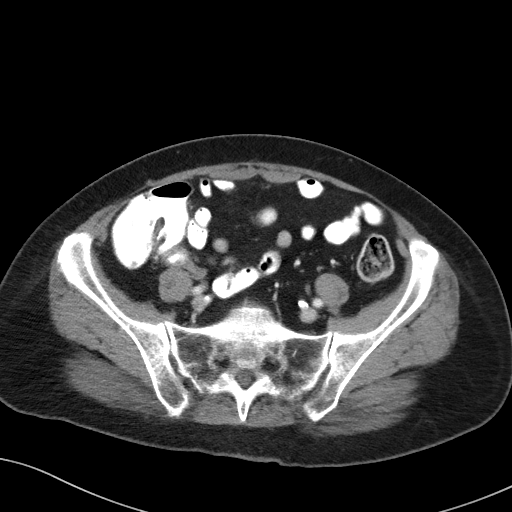
[im 37/98  soft-tissue]
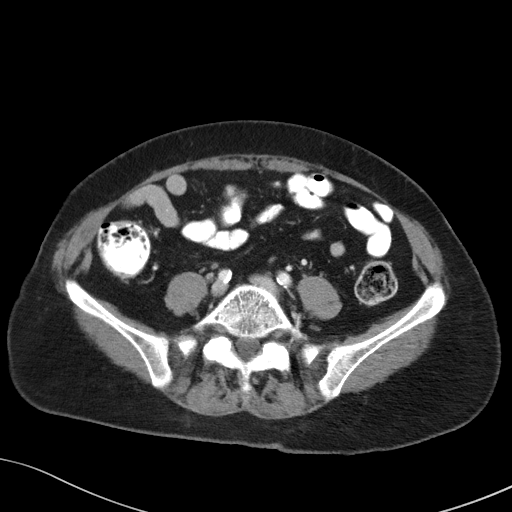
[im 43/98  soft-tissue]
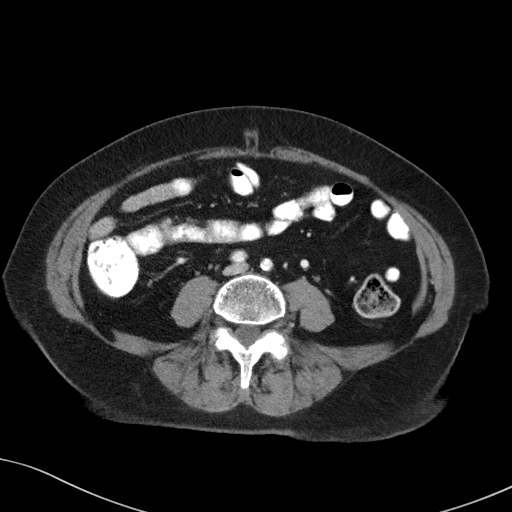
[im 49/98  soft-tissue]
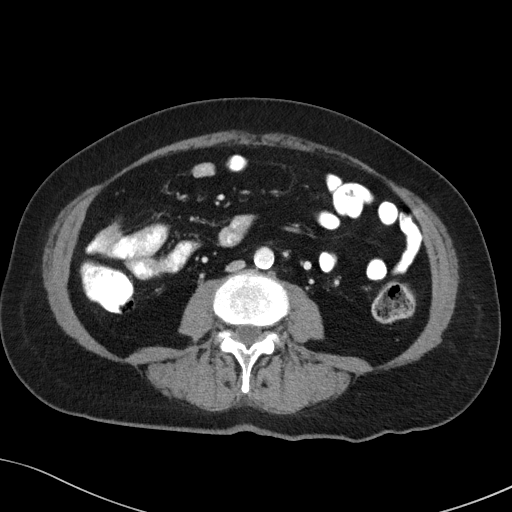
[im 55/98  soft-tissue]
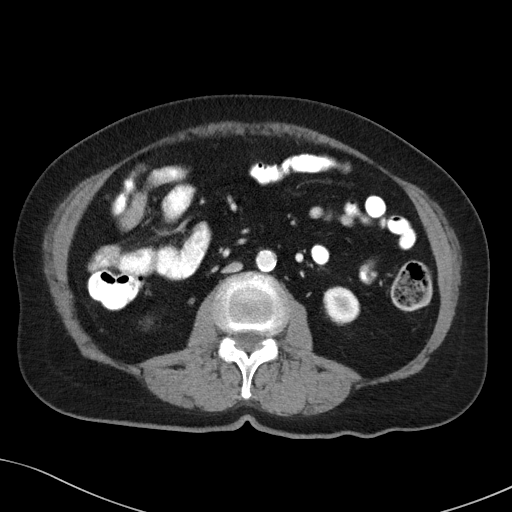
[im 61/98  soft-tissue]
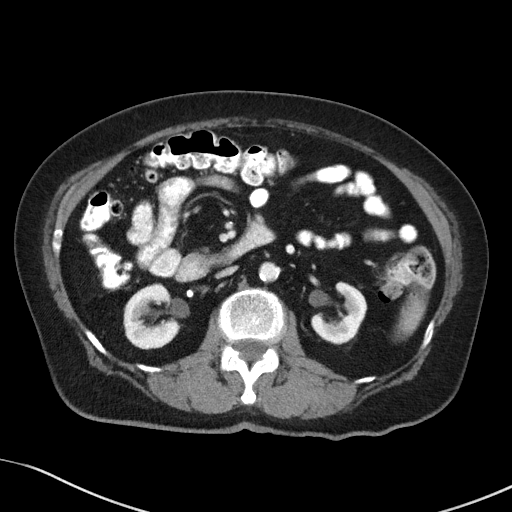
[im 61/98  bone]
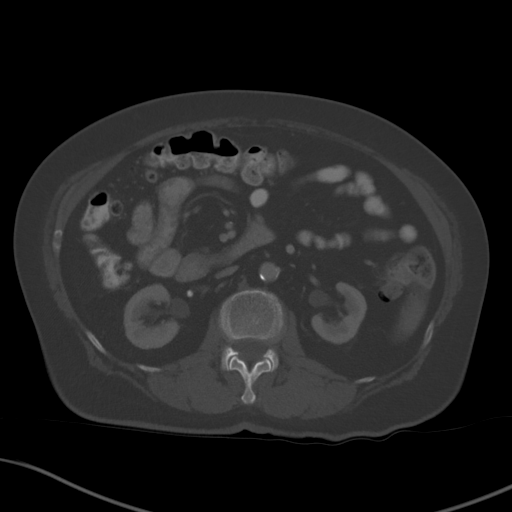
[im 67/98  soft-tissue]
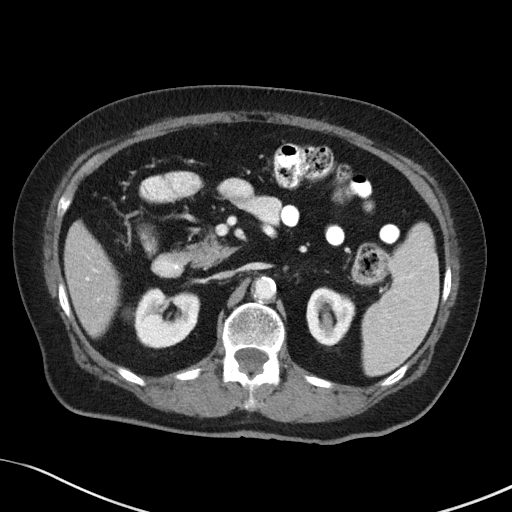
[im 79/98  soft-tissue]
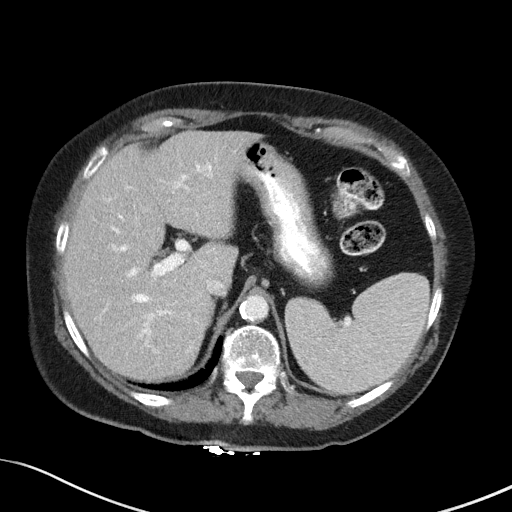
[im 85/98  soft-tissue]
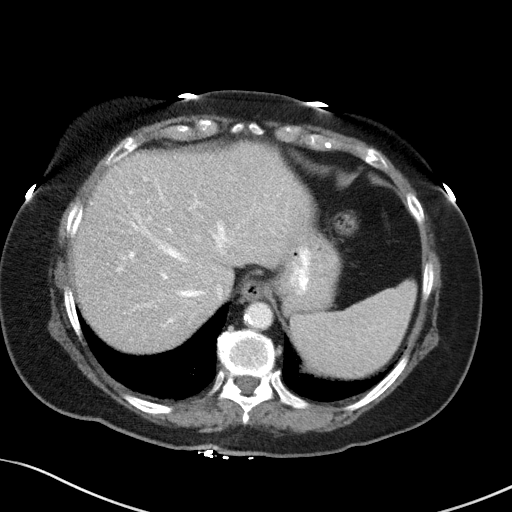
[im 91/98  soft-tissue]
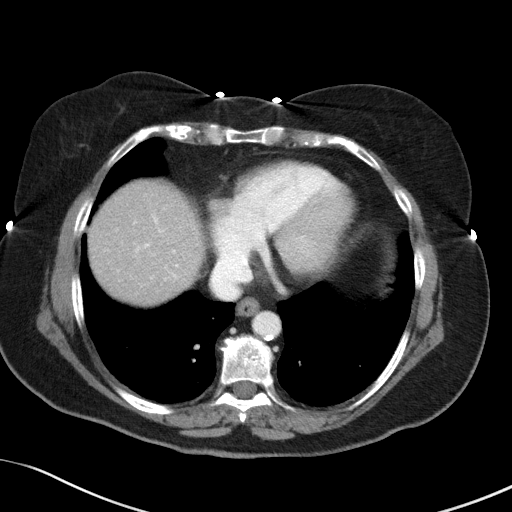

[Series 5: coronal st · coronal · 0.70mm/px · 3 of 90 slices shown]
[im 30/90  soft-tissue]
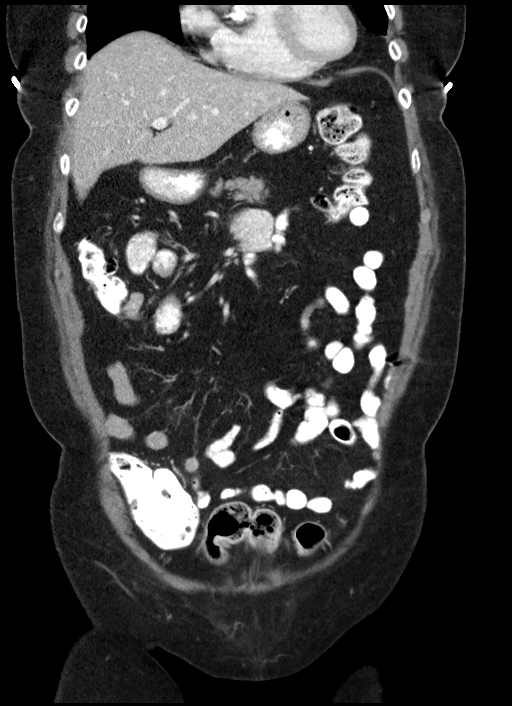
[im 40/90  soft-tissue]
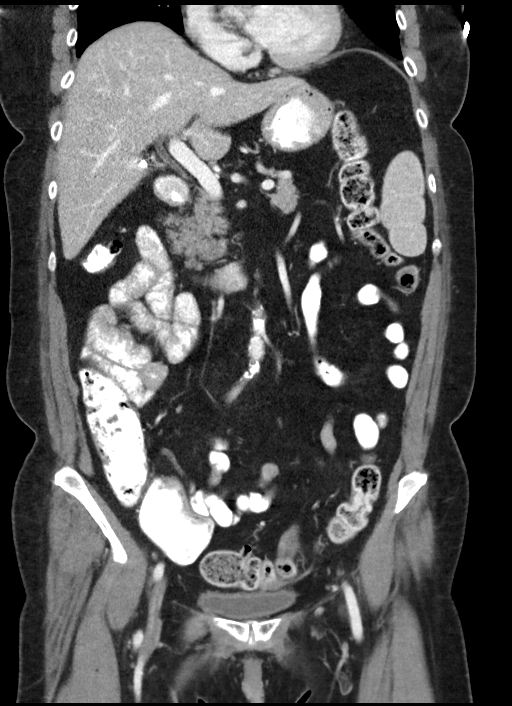
[im 50/90  soft-tissue]
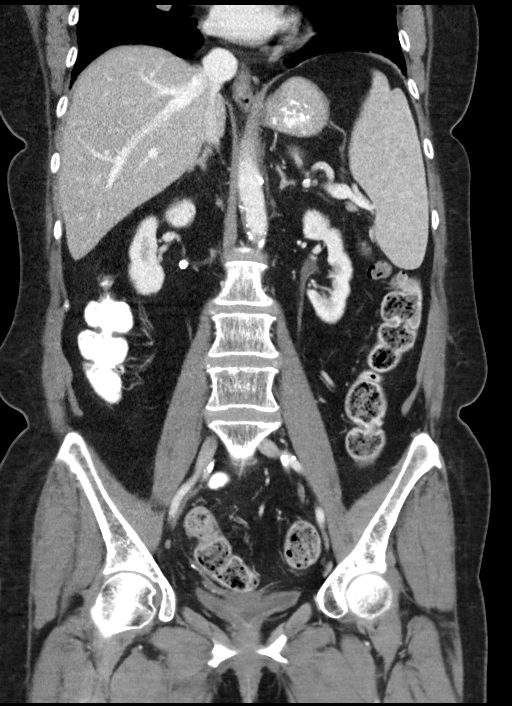

[16 of 46 positions shown; findings below may reference images not displayed]

FINDINGS: Lower chest: No acute abnormality.

Hepatobiliary: No focal liver abnormality is seen. Hepatic
steatosis. Status post cholecystectomy. No biliary dilatation.

Pancreas: Unremarkable. No pancreatic ductal dilatation or
surrounding inflammatory changes.

Spleen: Splenomegaly, maximum coronal span 16.2 cm.

Adrenals/Urinary Tract: Adrenal glands are unremarkable. Kidneys are
normal, without renal calculi, solid lesion, or hydronephrosis.
Bladder is unremarkable.

Stomach/Bowel: Stomach is within normal limits. Appendix appears
normal. No evidence of bowel wall thickening, distention, or
inflammatory changes. Large burden of stool in the colon and rectum.

Vascular/Lymphatic: Aortic atherosclerosis. No enlarged abdominal or
pelvic lymph nodes.

Reproductive: Status post hysterectomy.

Other: No abdominal wall hernia or abnormality. No abdominopelvic
ascites.

Musculoskeletal: No acute or significant osseous findings.
IMPRESSION: 1.  Hepatic steatosis, new compared to prior examination.

2. Splenomegaly, maximum coronal span 16.2 cm, new compared to prior
examination.

3.  Large burden of stool in the colon and rectum.

4. Other chronic, incidental, and postoperative findings as detailed
above.

## 2019-05-10 MED ORDER — IOHEXOL 300 MG/ML  SOLN
100.0000 mL | Freq: Once | INTRAMUSCULAR | Status: AC | PRN
  Administered 2019-05-10: 14:00:00 100 mL via INTRAVENOUS

## 2019-05-10 NOTE — Telephone Encounter (Signed)
See note

## 2019-05-10 NOTE — Telephone Encounter (Signed)
Left VM for patient to call our office to schedule TOC appt

## 2019-05-10 NOTE — Telephone Encounter (Signed)
Ok with me 

## 2019-05-10 NOTE — Telephone Encounter (Signed)
Please advise on transfer after Dr. Raoul Pitch responds, Dr. Jonni Sanger has approved. Thank you

## 2019-05-21 ENCOUNTER — Other Ambulatory Visit: Payer: Self-pay | Admitting: Family Medicine

## 2019-05-24 ENCOUNTER — Other Ambulatory Visit: Payer: Self-pay

## 2019-05-24 NOTE — Patient Outreach (Signed)
[facility]) Care Management  05/24/2019  Elizabeth Mcintyre March 16, 1941 [id number]   Medication Adherence call to Elizabeth Mcintyre [facility] Identifiers Verify spoke with patient she is past due on Losartan 100 mg and Rosuvastatin 40 mg patient explain she is taking both medication and has medication until her next appointment patient will ask doctor if she needs to continued on both medication, patient had some procedure and did not take her medication for a couple of weeks.Elizabeth Mcintyre is showing past due under [place].   [place] Management Direct Dial [phone]  Fax [phone] [email or web address]

## 2019-05-25 NOTE — Telephone Encounter (Signed)
Scheduled appt on 9/23

## 2019-05-26 ENCOUNTER — Ambulatory Visit: Payer: Medicare Other | Admitting: Family Medicine

## 2019-06-06 ENCOUNTER — Ambulatory Visit
Admission: RE | Admit: 2019-06-06 | Discharge: 2019-06-06 | Disposition: A | Discharge status: Home / Self Care | Payer: Medicare Other | Source: Ambulatory Visit | Attending: Family Medicine | Admitting: Family Medicine

## 2019-06-06 ENCOUNTER — Other Ambulatory Visit: Payer: Self-pay | Admitting: Family Medicine

## 2019-06-06 ENCOUNTER — Other Ambulatory Visit: Payer: Self-pay

## 2019-06-06 DIAGNOSIS — R921 Mammographic calcification found on diagnostic imaging of breast: Secondary | ICD-10-CM

## 2019-06-06 DIAGNOSIS — R928 Other abnormal and inconclusive findings on diagnostic imaging of breast: Secondary | ICD-10-CM | POA: Diagnosis not present

## 2019-06-06 IMAGING — MG MM DIGITAL DIAGNOSTIC UNILAT*L* W/ TOMO W/ CAD
6 of 9 series · 6 of 21 positions shown · non-contrast
Comparison: Previous exam(s).

CLINICAL DATA: Patient presents for six-month follow-up of probably
benign left breast calcifications

EXAM:
DIGITAL DIAGNOSTIC UNILATERAL LEFT MAMMOGRAM WITH CAD AND TOMO

[L ML (1 of 2)]
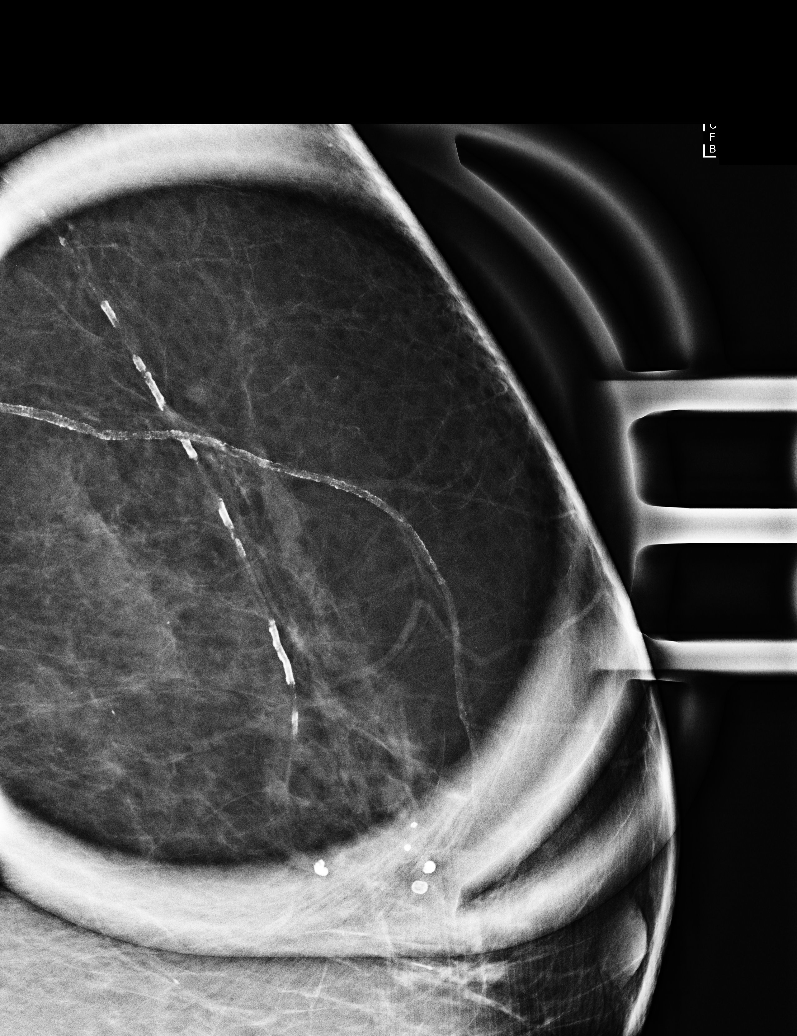

[L CC]
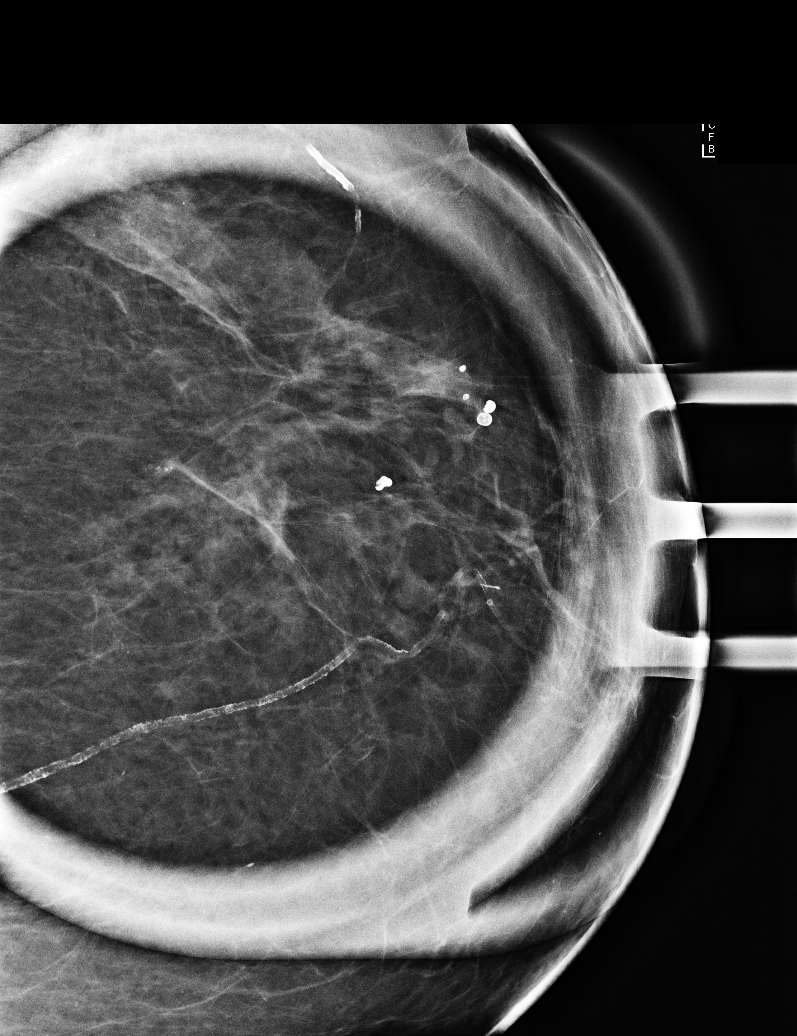

[L ML (2 of 2)]
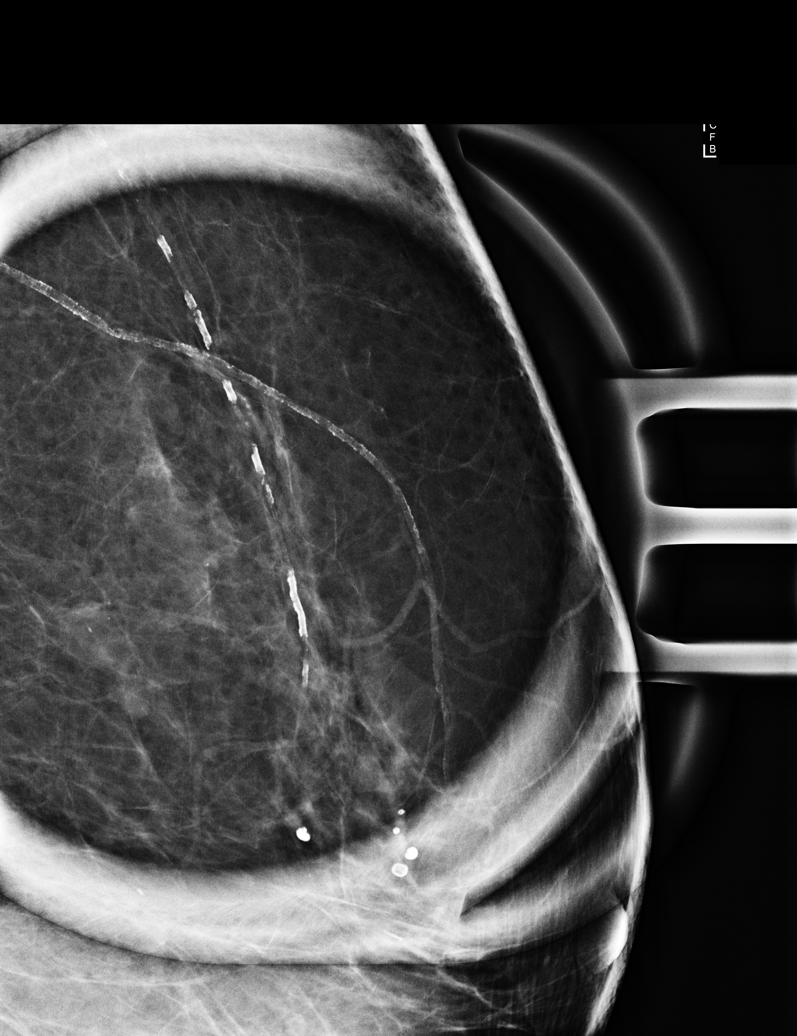

[L MLO synth-2D (1 of 2)]
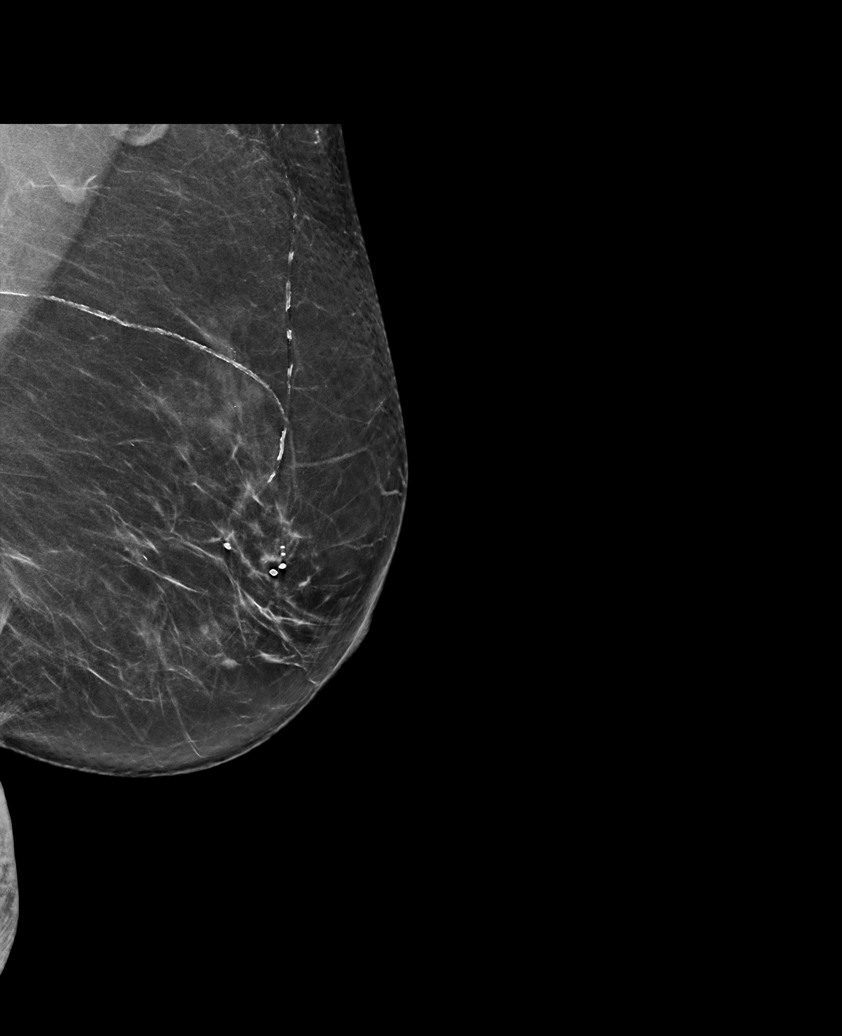

[L MLO synth-2D (2 of 2)]
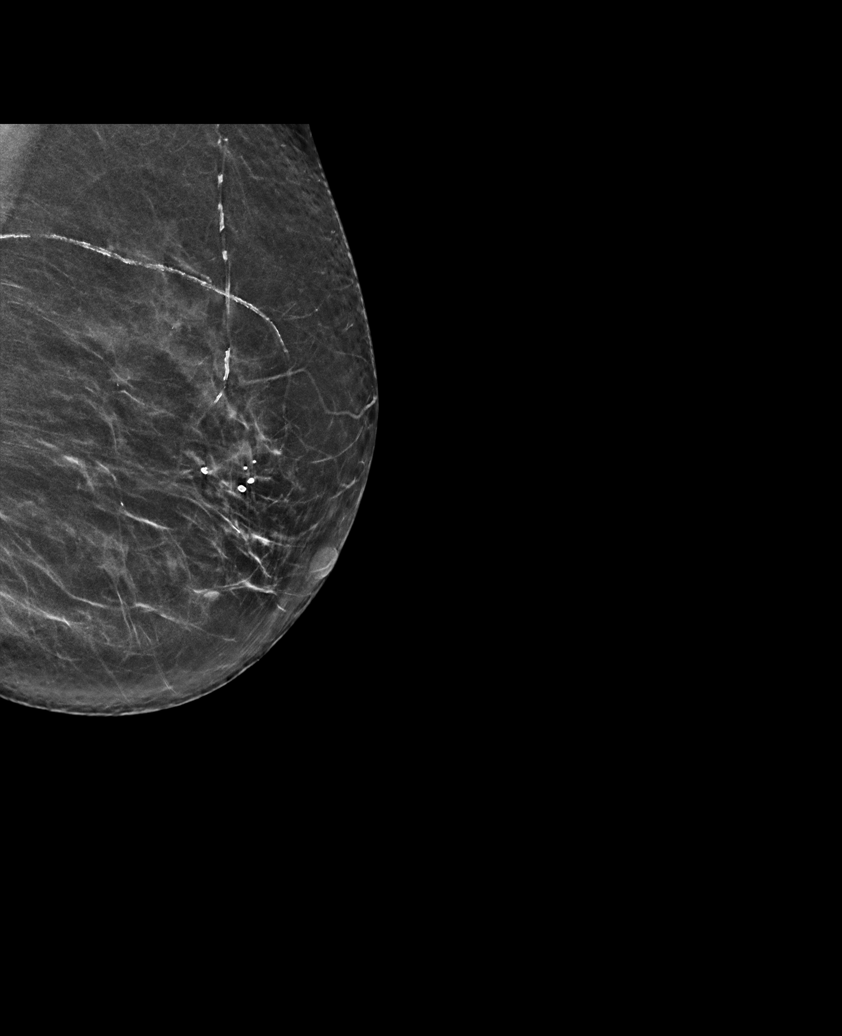

[L CC synth-2D]
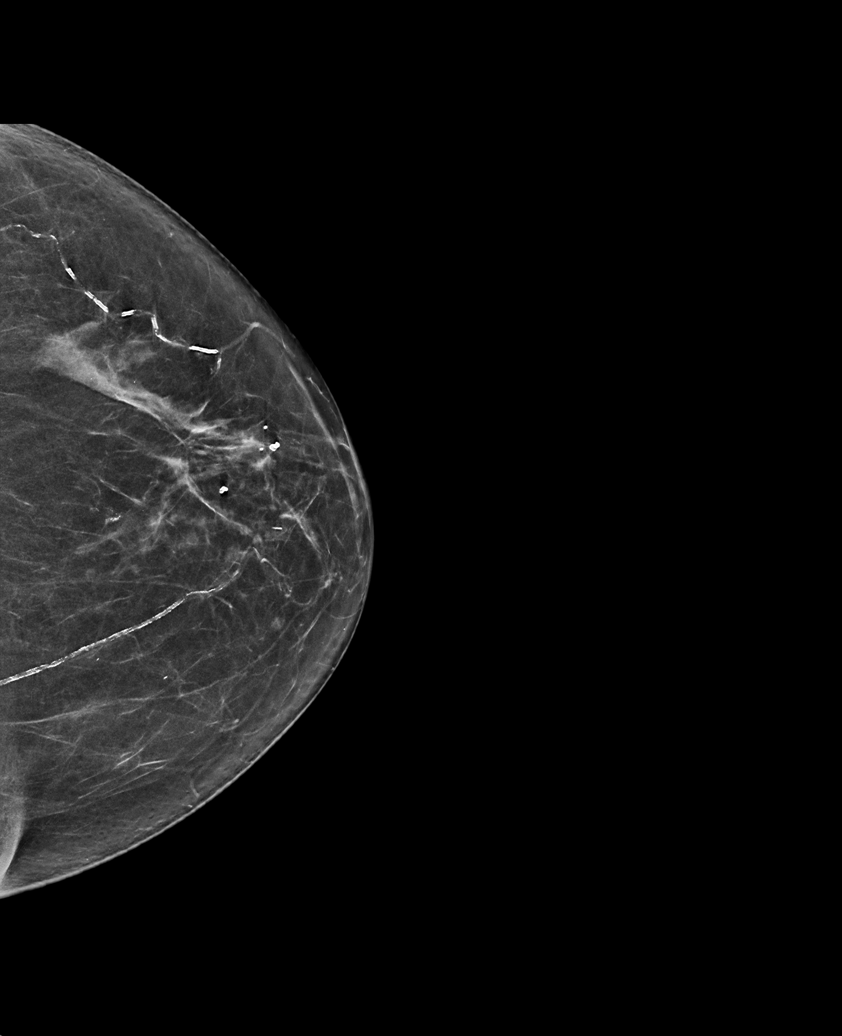

[6 of 21 positions shown; findings below may reference images not displayed]

ACR Breast Density Category b: There are scattered areas of
fibroglandular density.
FINDINGS: There is a group of stable coarse heterogeneous calcifications in
the upper-outer quadrant of the left breast.

No other suspicious mass, distortion, or microcalcifications are
identified to suggest presence of malignancy in the left breast.

Mammographic images were processed with CAD.
IMPRESSION: Stable probably benign grouped calcifications in the upper outer
left breast.

RECOMMENDATION:
Bilateral diagnostic mammogram in 6 months to confirm 1 year
stability of the calcifications in the left breast.

I have discussed the findings and recommendations with the patient.
If applicable, a reminder letter will be sent to the patient
regarding the next appointment.

BI-RADS CATEGORY  3: Probably benign.

## 2019-06-08 ENCOUNTER — Encounter: Payer: Self-pay | Admitting: Family Medicine

## 2019-06-08 ENCOUNTER — Ambulatory Visit (INDEPENDENT_AMBULATORY_CARE_PROVIDER_SITE_OTHER): Payer: Medicare Other | Admitting: Family Medicine

## 2019-06-08 ENCOUNTER — Other Ambulatory Visit: Payer: Self-pay

## 2019-06-08 VITALS — BP 125/79 | HR 91 | Temp 97.7°F | Resp 18 | Ht 62.0 in | Wt 149.0 lb

## 2019-06-08 DIAGNOSIS — E039 Hypothyroidism, unspecified: Secondary | ICD-10-CM | POA: Diagnosis not present

## 2019-06-08 DIAGNOSIS — K2100 Gastro-esophageal reflux disease with esophagitis, without bleeding: Secondary | ICD-10-CM

## 2019-06-08 DIAGNOSIS — E7849 Other hyperlipidemia: Secondary | ICD-10-CM

## 2019-06-08 DIAGNOSIS — E559 Vitamin D deficiency, unspecified: Secondary | ICD-10-CM

## 2019-06-08 DIAGNOSIS — E1122 Type 2 diabetes mellitus with diabetic chronic kidney disease: Secondary | ICD-10-CM | POA: Diagnosis not present

## 2019-06-08 DIAGNOSIS — N183 Chronic kidney disease, stage 3 (moderate): Secondary | ICD-10-CM | POA: Diagnosis not present

## 2019-06-08 DIAGNOSIS — E1169 Type 2 diabetes mellitus with other specified complication: Secondary | ICD-10-CM

## 2019-06-08 DIAGNOSIS — M8580 Other specified disorders of bone density and structure, unspecified site: Secondary | ICD-10-CM

## 2019-06-08 DIAGNOSIS — R202 Paresthesia of skin: Secondary | ICD-10-CM

## 2019-06-08 DIAGNOSIS — R5383 Other fatigue: Secondary | ICD-10-CM

## 2019-06-08 DIAGNOSIS — F339 Major depressive disorder, recurrent, unspecified: Secondary | ICD-10-CM

## 2019-06-08 DIAGNOSIS — I10 Essential (primary) hypertension: Secondary | ICD-10-CM

## 2019-06-08 DIAGNOSIS — E782 Mixed hyperlipidemia: Secondary | ICD-10-CM

## 2019-06-08 DIAGNOSIS — E118 Type 2 diabetes mellitus with unspecified complications: Secondary | ICD-10-CM

## 2019-06-08 DIAGNOSIS — N2581 Secondary hyperparathyroidism of renal origin: Secondary | ICD-10-CM

## 2019-06-08 DIAGNOSIS — J4520 Mild intermittent asthma, uncomplicated: Secondary | ICD-10-CM

## 2019-06-08 NOTE — Patient Instructions (Signed)
Stop the losartan all together.  We will call you with lab results as soon as we get them.  I will refill the meds you take daily that was prescribed by Dr. Jonni Sanger.   Continue to monitor your BP and make sure it stays below 135/85. If it does not , we will need to add back some of the losartan.   Make sure you hydrating. With your frequent loose stools you will need to make sure you are hydrating. Urine should be very pale yellow to clear. The darker the urine, the more water you need to drink.  Dehydration can cause the dizziness.    Follow up in 6 months on chronic conditions. Sooner if needed for illness.   Please help [place] help you:  We are honored you have chosen [facility] for your Primary Care home. Below you will find basic instructions that you may need to access in the future. Please help [place] help you by reading the instructions, which cover many of the frequent questions we experience.   Prescription refills and request:  -In order to allow more efficient response time, please call your pharmacy for all refills. They will forward the request electronically to [place]. This allows for the quickest possible response. Request left on a nurse line can take longer to refill, since these are checked as time allows between office patients and other phone calls.  - refill request can take up to 3-5 working days to complete.  - If request is sent electronically and request is appropiate, it is usually completed in 1-2 business days.  - all patients will need to be seen routinely for all chronic medical conditions requiring prescription medications (see follow-up below). If you are overdue for follow up on your condition, you will be asked to make an appointment and we will call in enough medication to cover you until your appointment (up to 30 days).  - all controlled substances will require a face to face visit to request/refill.  - if you desire your prescriptions to go through a new pharmacy,  and have an active script at original pharmacy, you will need to call your pharmacy and have scripts transferred to new pharmacy. This is completed between the pharmacy locations and not by your provider.    Results: If any images or labs were ordered, it can take up to 1 week to get results depending on the test ordered and the lab/facility running and resulting the test. - Normal or stable results, which do not need further discussion, may be released to your mychart immediately with attached note to you. A call may not be generated for normal results. Please make certain to sign up for mychart. If you have questions on how to activate your mychart you can call the front office.  - If your results need further discussion, our office will attempt to contact you via phone, and if unable to reach you after 2 attempts, we will release your abnormal result to your mychart with instructions.  - All results will be automatically released in mychart after 1 week.  - Your provider will provide you with explanation and instruction on all relevant material in your results. Please keep in mind, results and labs may appear confusing or abnormal to the untrained eye, but it does not mean they are actually abnormal for you personally. If you have any questions about your results that are not covered, or you desire more detailed explanation than what was provided, you  should make an appointment with your provider to do so.   Our office handles many outgoing and incoming calls daily. If we have not contacted you within 1 week about your results, please check your mychart to see if there is a message first and if not, then contact our office.  In helping with this matter, you help decrease call volume, and therefore allow [place] to be able to respond to patients needs more efficiently.   Acute office visits (sick visit):  An acute visit is intended for a new problem and are scheduled in shorter time slots to allow schedule  openings for patients with new problems. This is the appropriate visit to discuss a new problem. Problems will not be addressed by phone call or Echart message. Appointment is needed if requesting treatment. In order to provide you with excellent quality medical care with proper time for you to explain your problem, have an exam and receive treatment with instructions, these appointments should be limited to one new problem per visit. If you experience a new problem, in which you desire to be addressed, please make an acute office visit, we save openings on the schedule to accommodate you. Please do not save your new problem for any other type of visit, let us take care of it properly and quickly for you.   Follow up visits:  Depending on your condition(s) your provider will need to see you routinely in order to provide you with quality care and prescribe medication(s). Most chronic conditions (Example: hypertension, Diabetes, depression/anxiety... etc), require visits a couple times a year. Your provider will instruct you on proper follow up for your personal medical conditions and history. Please make certain to make follow up appointments for your condition as instructed. Failing to do so could result in lapse in your medication treatment/refills. If you request a refill, and are overdue to be seen on a condition, we will always provide you with a 30 day script (once) to allow you time to schedule.    Medicare wellness (well visit): - we have a wonderful Nurse Maudie Mercury), that will meet with you and provide you will yearly medicare wellness visits. These visits should occur yearly (can not be scheduled less than 1 calendar year apart) and cover preventive health, immunizations, advance directives and screenings you are entitled to yearly through your medicare benefits. Do not miss out on your entitled benefits, this is when medicare will pay for these benefits to be ordered for you.  These are strongly  encouraged by your provider and is the appropriate type of visit to make certain you are up to date with all preventive health benefits. If you have not had your medicare wellness exam in the last 12 months, please make certain to schedule one by calling the office and schedule your medicare wellness with Maudie Mercury as soon as possible.   Yearly physical (well visit):  - Adults are recommended to be seen yearly for physicals. Check with your insurance and date of your last physical, most insurances require one calendar year between physicals. Physicals include all preventive health topics, screenings, medical exam and labs that are appropriate for gender/age and history. You may have fasting labs needed at this visit. This is a well visit (not a sick visit), new problems should not be covered during this visit (see acute visit).  - Pediatric patients are seen more frequently when they are younger. Your provider will advise you on well child visit timing that is appropriate for your  their age. - This is not a medicare wellness visit. Medicare wellness exams do not have an exam portion to the visit. Some medicare companies allow for a physical, some do not allow a yearly physical. If your medicare allows a yearly physical you can schedule the medicare wellness with our nurse Maudie Mercury and have your physical with your provider after, on the same day. Please check with insurance for your full benefits.   Late Policy/No Shows:  - all new patients should arrive 15-30 minutes earlier than appointment to allow [place] time  to  obtain all personal demographics,  insurance information and for you to complete office paperwork. - All established patients should arrive 10-15 minutes earlier than appointment time to update all information and be checked in .  - In our best efforts to run on time, if you are late for your appointment you will be asked to either reschedule or if able, we will work you back into the schedule. There will be  a wait time to work you back in the schedule,  depending on availability.  - If you are unable to make it to your appointment as scheduled, please call 24 hours ahead of time to allow [place] to fill the time slot with someone else who needs to be seen. If you do not cancel your appointment ahead of time, you may be charged a no show fee.

## 2019-06-08 NOTE — Progress Notes (Signed)
Patient ID: Elizabeth Mcintyre, female  DOB: September 21, 1940, 78 y.o.   MRN: [id number] Patient Care Team    Relationship Specialty Notifications Start End  Ma Hillock, DO PCP - General Family Medicine  06/08/19   Danie Binder, MD Consulting Physician Gastroenterology  01/29/15   Daneil Dolin, MD Consulting Physician Gastroenterology  05/14/15   Minus Breeding, MD Consulting Physician Cardiology  01/30/17   Annitta Needs, NP  Gastroenterology  03/13/17   Carlis Stable, NP Nurse Practitioner Gastroenterology  09/29/18     Chief Complaint  Patient presents with  . Establish Care    Est care. Prior PCP Dr Jonni Sanger. Pt is doing well with no complaints     Subjective:  Elizabeth Mcintyre is a 78 y.o.  female present for TOC. All past medical history, surgical history, allergies, family history, immunizations, medications and social history were updated in the electronic medical record today. All recent labs, ED visits and hospitalizations within the last year were reviewed.  Mild intermittent asthma without complication Stable. Rarely needs inhaler- she does need refills today.   Essential hypertension, benign/hyperlipidemia/overweight/CKD 3 Pt reports compliance with amlodipine 10 mg daily.  She had been on losartan 50 mcg daily that she has discontinued over the last week secondary to dizziness.. Blood pressures ranges at home remain in normal parameters. Patient denies chest pain, shortness of breath or lower extremity edema.  Pt is  prescribed statin. BMP: 04/20/2019 GFR 37, creatinine 1.38. CBC: 04/20/2019 within normal limits Lipid: 06/08/2019 total cholesterol 176, HDL 36, LDL 94, triglycerides were nonfasting Diet: Low-sodium Exercise: Exercise routinely RF: Hypertension, hyperlipidemia, diabetes, overweight,, family history of heart disease  Acquired hypothyroidism Patient reports she had an elevated TSH a few months ago.  Her dose was then increased to levothyroxine 75 mcg daily.  She  reports compliance with medication on empty stomach.  She is due for repeat levels today.  Major depression, recurrent, chronic (HCC) Patient has a history of depression.  Currently prescribed Lexapro 10 mg daily.  Patient reports condition is well controlled on this medication.  Vitamin D deficiency/Osteopenia, unspecified location Patient has a history of vitamin D deficiency and osteopenia.  She reports she sometimes takes 400 units of vitamin D a day.  Paresthesia/fatigue Patient reports she is having some dizziness, fatigue and a strange paresthesia sensation located over her left anterior scalp for approximately 2 weeks.  She reports it will feel like there is pain and then it trickles down her face towards her temple.  Type 2 diabetes mellitus with complication (HCC) Pt reports she has been diet controlled. Denies numbness, tingling of extremities, hypo/hyperglycemic events or non-healing wounds.   PNA series: Completed 2018 Flu shot: Declined today (recommneded yearly) Urine microalbumin: Overdue, will collect next office visit.  Had been on a [place], recently discontinued statin. Foot exam: 04/20/2019 completed Eye exam: Overdue.  Encouraged yearly eye exams. A1c: Last A1c 6.20 April 2019    Gastroesophageal reflux disease with esophagitis/chronic bloating/chronic diarrhea Managed by gastroenterology.  Depression screen [facility] 2/9 09/29/2018 01/25/2018 10/15/2017 03/13/2017 01/29/2017  Decreased Interest 1 1 1  0 0  Down, Depressed, Hopeless 1 1 1  0 0  PHQ - 2 Score 2 2 2  0 0  Altered sleeping 3 1 - - -  Tired, decreased energy 2 1 - - -  Change in appetite 2 1 - - -  Feeling bad or failure about yourself  1 1 - - -  Trouble concentrating  0 0 - - -  Moving slowly or fidgety/restless 0 0 - - -  Suicidal thoughts 3 0 - - -  PHQ-9 Score 13 6 - - -  Difficult doing work/chores Somewhat difficult Somewhat difficult - - -   No flowsheet data found.     Fall Risk  09/29/2018 10/15/2017  03/13/2017 01/29/2017 05/25/2015  Falls in the past year? 0 No No No No     Immunization History  Administered Date(s) Administered  . Influenza Split 07/16/2011  . Influenza, High Dose Seasonal PF 10/15/2017, 09/03/2018  . Influenza,inj,Quad PF,6+ Mos 06/09/2014, 10/15/2015  . Pneumococcal Conjugate-13 03/13/2017  . Pneumococcal Polysaccharide-23 10/27/2014  . Tdap 07/16/2011    No exam data present  Past Medical History:  Diagnosis Date  . Asthma   . 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 DIABETES MELLITUS, TYPE II 11/09/2007   diet control  . Diverticulosis 03/2011  . GERD (gastroesophageal reflux disease)   . GI bleed   . History of rheumatoid arthritis    during 30's, was treated.  . Hyperlipidemia   . Hypertension   . Osteopenia 2017   Last  bone density 05/04/2017: -2.4  . PONV (postoperative nausea and vomiting)   . Stress incontinence    Allergies  Allergen Reactions  . Codeine Phosphate Nausea And Vomiting and Rash   Past Surgical History:  Procedure Laterality Date  . ABDOMINAL HYSTERECTOMY    . CARDIAC CATHETERIZATION     X 2, last one in 1998  . CHOLECYSTECTOMY N/A 04/13/2017   Procedure: LAPAROSCOPIC CHOLECYSTECTOMY;  Surgeon: Aviva Signs, MD;  Location: [facility];  Service: General;  Laterality: N/A;  . COLONOSCOPY    . COLONOSCOPY  May 2012   Dr. Olevia Perches: mild diverticulosis, otherwise normal.   . ESOPHAGOGASTRODUODENOSCOPY N/A 01/28/2015   Dr. Gala Romney: reflux esophagitis, Schatzki's ring not manipulated due to recent bleeding  . ESOPHAGOGASTRODUODENOSCOPY N/A 03/30/2015   Dr. Gala Romney: Schatzki's ring s/p Venia Minks dilation, previously noted esophageal ulcer completely healed  . MALONEY DILATION N/A 03/30/2015   Procedure: Venia Minks DILATION;  Surgeon: Daneil Dolin, MD;  Location: [facility] SUITE;  Service: Endoscopy;  Laterality: N/A;   Family History  Problem Relation Age of Onset  . Heart disease Mother   .  Osteoarthritis Mother   . Sudden death Father   . Single kidney Father   . Other Father        h/o severe MVA injuries  . Hyperlipidemia Sister   . Other Daughter        Myalgias  . Fibromyalgia Daughter   . Allergies Daughter   . Heart disease Maternal Grandfather   . Sudden death Paternal Grandmother   . Diabetes Paternal Grandfather   . Heart disease Daughter   . Other Daughter        palpitations  . Pulmonary fibrosis Maternal Aunt   . Cancer Paternal Uncle   . Pulmonary fibrosis Maternal Aunt   . Colon cancer Neg Hx    Social History   Social History Narrative   Ms. Dworkin is widowed. Her young grandson lives with her, for whom she shares custody with her daughter, the son's aunt.      Allergies as of 06/08/2019      Reactions   Codeine Phosphate Nausea And Vomiting, Rash      Medication List       Accurate as of June 08, 2019  3:08 PM.  If you have any questions, ask your nurse or doctor.        acetaminophen 325 MG tablet Commonly known as: TYLENOL Take 650 mg by mouth every 6 (six) hours as needed for moderate pain.   albuterol 108 (90 Base) MCG/ACT inhaler Commonly known as: VENTOLIN HFA Inhale 2 puffs into the lungs every 6 (six) hours as needed for wheezing.   amLODipine 10 MG tablet Commonly known as: NORVASC Take 1 tablet (10 mg total) by mouth daily.   dicyclomine 10 MG capsule Commonly known as: BENTYL TAKE 1 CAPSULE (10 MG TOTAL) BY MOUTH 4 (FOUR) TIMES DAILY - BEFORE MEALS AND AT BEDTIME. AS NEEDED FOR CRAMPING   escitalopram 10 MG tablet Commonly known as: Lexapro Take 1 tablet (10 mg total) by mouth daily.   fluconazole 150 MG tablet Commonly known as: DIFLUCAN Take one tablet today; may repeat in 3 days if symptoms persist   glucose blood test strip Commonly known as: ONE TOUCH ULTRA TEST Use 1 test strip to test blood sugar levels 3 times daily. Dx. E11.9   levothyroxine 75 MCG tablet Commonly known as: SYNTHROID Take 1  tablet (75 mcg total) by mouth daily.   losartan 100 MG tablet Commonly known as: COZAAR Take 1 tablet (100 mg total) by mouth daily. Take 1/2 tablet daily   ONE TOUCH ULTRA SYSTEM KIT w/Device Kit 1 kit by Does not apply route once.   OneTouch Delica Lancets Fine Misc Test twice daily.   pantoprazole 40 MG tablet Commonly known as: PROTONIX Take 1 tablet (40 mg total) by mouth daily before breakfast.   rosuvastatin 40 MG tablet Commonly known as: CRESTOR TAKE 1 TABLET BY MOUTH EVERY DAY   triamcinolone cream 0.1 % Commonly known as: KENALOG Apply 1 application topically 2 (two) times daily. For 2-4 weeks as needed   vitamin B-12 1000 MCG tablet Commonly known as: CYANOCOBALAMIN Take 1,000 mcg by mouth daily.   Vitamin D (Cholecalciferol) 10 MCG (400 UNIT) Caps Take 400 Units by mouth daily.       All past medical history, surgical history, allergies, family history, immunizations andmedications were updated in the EMR today and reviewed under the history and medication portions of their EMR.    Recent Results (from the past 2160 hour(s))  POCT glycosylated hemoglobin (Hb A1C)     Status: Abnormal   Collection Time: 04/20/19  2:06 PM  Result Value Ref Range   Hemoglobin A1C 6.5 (A) 4.0 - 5.6 %   HbA1c POC (<> result, manual entry)     HbA1c, POC (prediabetic range)     HbA1c, POC (controlled diabetic range)    CBC with Differential/Platelet     Status: Abnormal   Collection Time: 04/20/19  2:09 PM  Result Value Ref Range   WBC 5.8 4.0 - 10.5 K/uL   RBC 4.38 3.87 - 5.11 Mil/uL   Hemoglobin 12.3 12.0 - 15.0 g/dL   HCT 36.5 36.0 - 46.0 %   MCV 83.4 78.0 - 100.0 fl   MCHC 33.6 30.0 - 36.0 g/dL   RDW 14.4 11.5 - 15.5 %   Platelets 155.0 150.0 - 400.0 K/uL   Neutrophils Relative % 57.3 43.0 - 77.0 %   Lymphocytes Relative 25.5 12.0 - 46.0 %   Monocytes Relative 10.3 3.0 - 12.0 %   Eosinophils Relative 6.0 (H) 0.0 - 5.0 %   Basophils Relative 0.9 0.0 - 3.0 %    Neutro Abs 3.3 1.4 - 7.7 K/uL  Lymphs Abs 1.5 0.7 - 4.0 K/uL   Monocytes Absolute 0.6 0.1 - 1.0 K/uL   Eosinophils Absolute 0.3 0.0 - 0.7 K/uL   Basophils Absolute 0.1 0.0 - 0.1 K/uL  Comprehensive metabolic panel     Status: Abnormal   Collection Time: 04/20/19  2:09 PM  Result Value Ref Range   Sodium 136 135 - 145 mEq/L   Potassium 4.1 3.5 - 5.1 mEq/L   Chloride 102 96 - 112 mEq/L   CO2 25 19 - 32 mEq/L   Glucose, Bld 132 (H) 70 - 99 mg/dL   BUN 21 6 - 23 mg/dL   Creatinine, Ser 1.38 (H) 0.40 - 1.20 mg/dL   Total Bilirubin 0.4 0.2 - 1.2 mg/dL   Alkaline Phosphatase 62 39 - 117 U/L   AST 16 0 - 37 U/L   ALT 12 0 - 35 U/L   Total Protein 6.9 6.0 - 8.3 g/dL   Albumin 4.5 3.5 - 5.2 g/dL   Calcium 9.4 8.4 - 10.5 mg/dL   GFR 37.08 (L) >60.00 mL/min  TSH     Status: Abnormal   Collection Time: 04/20/19  2:09 PM  Result Value Ref Range   TSH 5.70 (H) 0.35 - 4.50 uIU/mL  Celiac Disease Panel     Status: None   Collection Time: 04/26/19 11:04 AM  Result Value Ref Range   Immunoglobulin A 120 70 - 320 mg/dL   (tTG) Ab, IgG 1 U/mL    Comment: .        Value      Interpretation        -----      --------------        <6         No Antibody Detected        > or = 6   Antibody Detected .    (tTG) Ab, IgA 1 U/mL    Comment: .        Value      Interpretation        -----      --------------        <4         No Antibody Detected        > or = 4   Antibody Detected .    Gliadin IgA 7 Units    Comment: .           Value  Interpretation           -----  --------------           <20    Antibody not detected           >or=20 Antibody detected .    Gliadin IgG 2 Units    Comment: .           Value  Interpretation           -----  --------------           <20    Antibody not detected           >or=20 Antibody detected .     Mm Diag Breast Tomo Uni Left  Result Date: 06/06/2019 CLINICAL DATA:  Patient presents for six-month follow-up of probably benign left breast  calcifications EXAM: DIGITAL DIAGNOSTIC UNILATERAL LEFT MAMMOGRAM WITH CAD AND TOMO COMPARISON:  Previous exam(s). ACR Breast Density Category b: There are scattered areas of fibroglandular density. FINDINGS: There is a group of stable coarse heterogeneous calcifications in the  upper-outer quadrant of the left breast. No other suspicious mass, distortion, or microcalcifications are identified to suggest presence of malignancy in the left breast. Mammographic images were processed with CAD. IMPRESSION: Stable probably benign grouped calcifications in the upper outer left breast. RECOMMENDATION: Bilateral diagnostic mammogram in 6 months to confirm 1 year stability of the calcifications in the left breast. I have discussed the findings and recommendations with the patient. If applicable, a reminder letter will be sent to the patient regarding the next appointment. BI-RADS CATEGORY  3: Probably benign. Electronically Signed   By: Audie Pinto M.D.   On: 06/06/2019 12:25     ROS: 14 pt review of systems performed and negative (unless mentioned in an HPI)  Objective: BP 125/79 (BP Location: Left Arm, Patient Position: Sitting, Cuff Size: Normal)   Pulse 91   Temp 97.7 F (36.5 C) (Temporal)   Resp 18   Ht 5' 2"  (1.575 m)   Wt 149 lb (67.6 kg)   SpO2 99%   BMI 27.25 kg/m  Gen: Afebrile. No acute distress. Nontoxic in appearance, well-developed, well-nourished, pleasant, overweight Caucasian female. HENT: AT. [place].  Eyes:Pupils Equal Round Reactive to light, Extraocular movements intact,  Conjunctiva without redness, discharge or icterus. Neck/lymp/endocrine: Supple, no lymphadenopathy, no thyromegaly CV: RRR no murmur, no edema, +2/4 P posterior tibialis pulses.  Chest: CTAB, no wheeze, rhonchi or crackles.  Abd: Soft. NTND. BS present Skin: Warm and well-perfused. Skin intact.  No rashes. Neuro/Msk: Normal gait. PERLA. EOMi. Alert. Oriented x3.   Psych: Normal affect, dress and demeanor.  Normal speech. Normal thought content and judgment.   Assessment/plan: Elizabeth Mcintyre is a 78 y.o. female present for TOC Mild intermittent asthma without complication Stable.  Refills provided today on her albuterol. - albuterol (VENTOLIN HFA) 108 (90 Base) MCG/ACT inhaler; Inhale 2 puffs into the lungs every 6 (six) hours as needed for wheezing.  Dispense: 8.5 g; Refill: 2  Essential hypertension, benign/hyperlipidemia -Stable.  She has not been taking the losartan secondary to dizziness. -Continue amlodipine 10 mg daily. -Stop losartan, monitor blood pressures and if routinely above 135/85 we will add back losartan at 25 mg daily. -Continue Crestor 40 mg daily. -Low-sodium diet, routine exercise. - TSH - Lipid panel -Refills provided today on the above medications. -Follow-up 6 months unless needed sooner  Acquired hypothyroidism -Recheck thyroid levels today.  Will refill medications at appropriate dose once results received.  Monitor yearly. - TSH - T4, free  CKD stage 3 secondary (HCC) - Renally dose meds when appropriate. - Baseline GFR approximately 40, baseline creatinine 1.38 - Avoid NSAIDs - Vitamin D (25 hydroxy) - PTH, Intact and Calcium  Major depression, recurrent, chronic (HCC) Stable.  Continue Lexapro 10 mg daily.  Refills provided today. Follow-up in 6 months  Vitamin D deficiency/Osteopenia, unspecified location Patient with history of vitamin D deficiency.  Reports intermittently takes 400 units of vitamin D daily.  With present symptoms of fatigue and paresthesias. - Vitamin D (25 hydroxy) - PTH, Intact and Calcium  Paresthesia/fatigue -She does supplement with B12 and low-dose vitamin D daily.  She has a history of vitamin D deficiency and CKD 3.  We will check vitamin D, B12 and iron panel today.  Patient will be called back with results and guided on instructions. - B12 - Iron, TIBC and Ferritin Panel  Type 2 diabetes mellitus with complication  (HCC) Last A1c 6.20 April 2019. She has been diet controlled, per prior PCP notes.  Gastroesophageal reflux  disease with esophagitis Managed by gastroenterology.    No follow-ups on file.   Note is dictated utilizing voice recognition software. Although note has been proof read prior to signing, occasional typographical errors still can be missed. If any questions arise, please do not hesitate to call for verification.  Electronically signed by: Howard Pouch, DO [place]

## 2019-06-09 ENCOUNTER — Telehealth: Payer: Self-pay | Admitting: Family Medicine

## 2019-06-09 ENCOUNTER — Encounter: Payer: Self-pay | Admitting: Family Medicine

## 2019-06-09 DIAGNOSIS — N2581 Secondary hyperparathyroidism of renal origin: Secondary | ICD-10-CM | POA: Insufficient documentation

## 2019-06-09 LAB — VITAMIN D 25 HYDROXY (VIT D DEFICIENCY, FRACTURES): Vit D, 25-Hydroxy: 18 ng/mL — ABNORMAL LOW (ref 30–100)

## 2019-06-09 LAB — T4, FREE: Free T4: 0.79 ng/dL (ref 0.60–1.60)

## 2019-06-09 LAB — TSH: TSH: 1.09 mIU/L (ref 0.40–4.50)

## 2019-06-09 LAB — IRON,TIBC AND FERRITIN PANEL
%SAT: 17 % (calc) (ref 16–45)
Ferritin: 37 ng/mL (ref 16–288)
Iron: 72 ug/dL (ref 45–160)
TIBC: 416 mcg/dL (calc) (ref 250–450)

## 2019-06-09 LAB — LIPID PANEL
Cholesterol: 176 mg/dL (ref <200)
HDL: 36 mg/dL — ABNORMAL LOW (ref >=50)
LDL Cholesterol (Calc): 94 mg/dL (calc)
Non-HDL Cholesterol (Calc): 140 mg/dL (calc) — ABNORMAL HIGH (ref <130)
Total CHOL/HDL Ratio: 4.9 (calc) (ref <5.0)
Triglycerides: 347 mg/dL — ABNORMAL HIGH (ref <150)

## 2019-06-09 LAB — PTH, INTACT AND CALCIUM
Calcium: 9.4 mg/dL (ref 8.6–10.4)
PTH: 124 pg/mL — ABNORMAL HIGH (ref 14–64)

## 2019-06-09 LAB — EXTRA SPECIMEN

## 2019-06-09 LAB — VITAMIN B12: Vitamin B-12: 512 pg/mL (ref 200–1100)

## 2019-06-09 MED ORDER — VITAMIN D (ERGOCALCIFEROL) 1.25 MG (50000 UNIT) PO CAPS: 50000.0000 [IU] | ORAL_CAPSULE | ORAL | 0 refills | Status: DC

## 2019-06-09 MED ORDER — LEVOTHYROXINE SODIUM 75 MCG PO TABS: 75.0000 ug | ORAL_TABLET | Freq: Every day | ORAL | 3 refills | Status: DC

## 2019-06-09 MED ORDER — ESCITALOPRAM OXALATE 10 MG PO TABS: 10.0000 mg | ORAL_TABLET | Freq: Every day | ORAL | 1 refills | Status: DC

## 2019-06-09 MED ORDER — ALBUTEROL SULFATE HFA 108 (90 BASE) MCG/ACT IN AERS: 2.0000 | INHALATION_SPRAY | Freq: Four times a day (QID) | RESPIRATORY_TRACT | 2 refills | Status: DC | PRN

## 2019-06-09 MED ORDER — AMLODIPINE BESYLATE 10 MG PO TABS: 10.0000 mg | ORAL_TABLET | Freq: Every day | ORAL | 1 refills | Status: DC

## 2019-06-09 MED ORDER — ROSUVASTATIN CALCIUM 40 MG PO TABS: 40.0000 mg | ORAL_TABLET | Freq: Every day | ORAL | 3 refills | Status: DC

## 2019-06-09 NOTE — Telephone Encounter (Signed)
Pt was called and given results and she verbalized understanding

## 2019-06-09 NOTE — Telephone Encounter (Signed)
Please inform patient the following information: Her labs are all normal and look good with the exception of her vitamin D and parathyroid hormone. -Her vitamin D is 18, normal is over 30 and ideally bone health should be in the 40-50.  When vitamin D is low he drives the parathyroid hormone up, therefore I think her increase in parathyroid hormone is secondary to her vitamin D deficiency and should correct as we correct her vitamin D.  These levels certainly can be creating her reported symptoms.  -I have called in the once weekly vitamin D dosing, that should be taken with food for better absorption.  Follow-up in 3 months with provider for recheck on levels and symptoms.  After vitamin D prescribed is completed, she should remain on an over-the-counter vitamin D 1000 units daily.  I have refilled her thyroid medication, dose will remain the same at 75 mcg of levothyroxine daily. Thanks!

## 2019-06-09 NOTE — Telephone Encounter (Signed)
Pt was called and VM was left to return call  °

## 2019-07-06 ENCOUNTER — Ambulatory Visit: Payer: Medicare Other | Admitting: Nurse Practitioner

## 2019-07-06 NOTE — Progress Notes (Deleted)
Referring Provider: Leamon Arnt, MD Primary Care Physician:  Ma Hillock, DO Primary GI:  Dr. Oneida Alar  No chief complaint on file.   HPI:   Elizabeth Mcintyre is a 78 y.o. female who presents for follow-up on abdominal pain and diarrhea.  The patient was last seen in our office 04/26/2019 for the same as well as bloating.  Colonoscopy up-to-date and next due in 2022.  Previous history of intermittent diarrhea associated with abdominal pain at times.  History of hypothyroidism on Synthroid.  C. difficile and GI pathogen panel previously checked and normal.  Elevated TSH with Synthroid dose adjustment just prior to her last visit.  At her last visit diarrhea improved, still on probiotic.  Still occurs 1-2 times a week.  She can tell when it is going to happen.  Does have some generalized abdominal pain and abdominal bloating, feels like "something is catching" when she bends forward.  No improvement with bowel movement.  Stopped coffee and sodas which is helped some.  Minimal GERD symptoms.  No other GI complaints.  Was prescribed Bentyl by primary care but held off on taking this until she saw [place].  Recommended use Bentyl as needed for diarrhea.  Check celiac panel and gluten sensitivity, dairy avoidance, CT of the abdomen and pelvis.  Follow-up in 2 months.  Celiac disease panel negative.  CT of the abdomen and pelvis 05/10/2019 found hepatic steatosis which is new compared to prior exams, splenomegaly which is new compared to prior exams, large stool burden in the colon and rectum.  Recommended she try MiraLAX 1-2 times a day for the next 5 days to see if this helps with bowel emptying.  She was warned of possible temporary diarrhea.  Of note platelet count historically low normal, last CBC on 04/20/2019 with a platelet count of 155.  Today she states   Past Medical History:  Diagnosis Date  . Asthma   . 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  DIABETES MELLITUS, TYPE II 11/09/2007   diet control  . Diverticulosis 03/2011  . GERD (gastroesophageal reflux disease)   . GI bleed   . History of rheumatoid arthritis    during 30's, was treated.  . Hyperlipidemia   . Hypertension   . Osteopenia 2017   Last  bone density 05/04/2017: -2.4  . PONV (postoperative nausea and vomiting)   . Stress incontinence     Past Surgical History:  Procedure Laterality Date  . ABDOMINAL HYSTERECTOMY    . CARDIAC CATHETERIZATION     X 2, last one in 1998  . CHOLECYSTECTOMY N/A 04/13/2017   Procedure: LAPAROSCOPIC CHOLECYSTECTOMY;  Surgeon: Aviva Signs, MD;  Location: [facility];  Service: General;  Laterality: N/A;  . COLONOSCOPY    . COLONOSCOPY  May 2012   Dr. Olevia Perches: mild diverticulosis, otherwise normal.   . ESOPHAGOGASTRODUODENOSCOPY N/A 01/28/2015   Dr. Gala Romney: reflux esophagitis, Schatzki's ring not manipulated due to recent bleeding  . ESOPHAGOGASTRODUODENOSCOPY N/A 03/30/2015   Dr. Gala Romney: Schatzki's ring s/p Venia Minks dilation, previously noted esophageal ulcer completely healed  . MALONEY DILATION N/A 03/30/2015   Procedure: Venia Minks DILATION;  Surgeon: Daneil Dolin, MD;  Location: [facility];  Service: Endoscopy;  Laterality: N/A;    Current Outpatient Medications  Medication Sig Dispense Refill  . acetaminophen (TYLENOL) 325 MG tablet Take 650 mg by mouth every 6 (six) hours as needed for moderate pain.     Marland Kitchen  albuterol (VENTOLIN HFA) 108 (90 Base) MCG/ACT inhaler Inhale 2 puffs into the lungs every 6 (six) hours as needed for wheezing. 8.5 g 2  . amLODipine (NORVASC) 10 MG tablet Take 1 tablet (10 mg total) by mouth daily. 90 tablet 1  . Blood Glucose Monitoring Suppl (ONE TOUCH ULTRA SYSTEM KIT) W/DEVICE KIT 1 kit by Does not apply route once. 1 each 0  . cholecalciferol (VITAMIN D3) 25 MCG (1000 UT) tablet Take 1,000 Units by mouth daily.    Marland Kitchen dicyclomine (BENTYL) 10 MG capsule TAKE 1 CAPSULE (10 MG TOTAL) BY MOUTH 4 (FOUR) TIMES DAILY  - BEFORE MEALS AND AT BEDTIME. AS NEEDED FOR CRAMPING 360 capsule 1  . escitalopram (LEXAPRO) 10 MG tablet Take 1 tablet (10 mg total) by mouth daily. 90 tablet 1  . glucose blood (ONE TOUCH ULTRA TEST) test strip Use 1 test strip to test blood sugar levels 3 times daily. Dx. E11.9 100 each 12  . levothyroxine (SYNTHROID) 75 MCG tablet Take 1 tablet (75 mcg total) by mouth daily. 90 tablet 3  . ONETOUCH DELICA LANCETS FINE MISC Test twice daily. 180 each 2  . pantoprazole (PROTONIX) 40 MG tablet Take 1 tablet (40 mg total) by mouth daily before breakfast. 90 tablet 3  . rosuvastatin (CRESTOR) 40 MG tablet Take 1 tablet (40 mg total) by mouth daily. 90 tablet 3  . triamcinolone cream (KENALOG) 0.1 % Apply 1 application topically 2 (two) times daily. For 2-4 weeks as needed 45 g 2  . vitamin B-12 (CYANOCOBALAMIN) 1000 MCG tablet Take 1,000 mcg by mouth daily.    . Vitamin D, Ergocalciferol, (DRISDOL) 1.25 MG (50000 UT) CAPS capsule Take 1 capsule (50,000 Units total) by mouth every 7 (seven) days. 12 capsule 0   No current facility-administered medications for this visit.     Allergies as of 07/06/2019 - Review Complete 06/09/2019  Allergen Reaction Noted  . Codeine phosphate Nausea And Vomiting and Rash     Family History  Problem Relation Age of Onset  . Heart disease Mother   . Osteoarthritis Mother   . Sudden death Father   . Single kidney Father   . Other Father        h/o severe MVA injuries  . Hyperlipidemia Sister   . Other Daughter        Myalgias  . Fibromyalgia Daughter   . Allergies Daughter   . Heart disease Maternal Grandfather   . Sudden death Paternal Grandmother   . Diabetes Paternal Grandfather   . Heart disease Daughter   . Other Daughter        palpitations  . Pulmonary fibrosis Maternal Aunt   . Cancer Paternal Uncle   . Pulmonary fibrosis Maternal Aunt   . Colon cancer Neg Hx     Social History   Socioeconomic History  . Marital status: Widowed     Spouse name: Not on file  . Number of children: 2  . Years of education: Not on file  . Highest education level: Not on file  Occupational History  . Occupation: retired  Scientific laboratory technician  . Financial resource strain: Not on file  . Food insecurity    Worry: Not on file    Inability: Not on file  . Transportation needs    Medical: Not on file    Non-medical: Not on file  Tobacco Use  . Smoking status: Never Smoker  . Smokeless tobacco: Never Used  Substance and Sexual Activity  .  Alcohol use: No  . Drug use: No  . Sexual activity: Never  Lifestyle  . Physical activity    Days per week: Not on file    Minutes per session: Not on file  . Stress: Not on file  Relationships  . Social Herbalist on phone: Not on file    Gets together: Not on file    Attends religious service: Not on file    Active member of club or organization: Not on file    Attends meetings of clubs or organizations: Not on file    Relationship status: Not on file  Other Topics Concern  . Not on file  Social History Narrative   Ms. Devaux is widowed. Her young grandson lives with her, for whom she shares custody with her daughter, the son's aunt.      Review of Systems: General: Negative for anorexia, weight loss, fever, chills, fatigue, weakness. Eyes: Negative for vision changes.  ENT: Negative for hoarseness, difficulty swallowing , nasal congestion. CV: Negative for chest pain, angina, palpitations, dyspnea on exertion, peripheral edema.  Respiratory: Negative for dyspnea at rest, dyspnea on exertion, cough, sputum, wheezing.  GI: See history of present illness. GU:  Negative for dysuria, hematuria, urinary incontinence, urinary frequency, nocturnal urination.  MS: Negative for joint pain, low back pain.  Derm: Negative for rash or itching.  Neuro: Negative for weakness, abnormal sensation, seizure, frequent headaches, memory loss, confusion.  Psych: Negative for anxiety, depression,  suicidal ideation, hallucinations.  Endo: Negative for unusual weight change.  Heme: Negative for bruising or bleeding. Allergy: Negative for rash or hives.   Physical Exam: There were no vitals taken for this visit. General:   Alert and oriented. Pleasant and cooperative. Well-nourished and well-developed.  Head:  Normocephalic and atraumatic. Eyes:  Without icterus, sclera clear and conjunctiva pink.  Ears:  Normal auditory acuity. Mouth:  No deformity or lesions, oral mucosa pink.  Throat/Neck:  Supple, without mass or thyromegaly. Cardiovascular:  S1, S2 present without murmurs appreciated. Normal pulses noted. Extremities without clubbing or edema. Respiratory:  Clear to auscultation bilaterally. No wheezes, rales, or rhonchi. No distress.  Gastrointestinal:  +BS, soft, non-tender and non-distended. No HSM noted. No guarding or rebound. No masses appreciated.  Rectal:  Deferred  Musculoskalatal:  Symmetrical without gross deformities. Normal posture. Skin:  Intact without significant lesions or rashes. Neurologic:  Alert and oriented x4;  grossly normal neurologically. Psych:  Alert and cooperative. Normal mood and affect. Heme/Lymph/Immune: No significant cervical adenopathy. No excessive bruising noted.    07/06/2019 12:56 PM   Disclaimer: This note was dictated with voice recognition software. Similar sounding words can inadvertently be transcribed and may not be corrected upon review.

## 2019-07-07 ENCOUNTER — Ambulatory Visit (HOSPITAL_COMMUNITY)
Admission: RE | Admit: 2019-07-07 | Discharge: 2019-07-07 | Disposition: A | Discharge status: Home / Self Care | Payer: Medicare Other | Source: Ambulatory Visit | Attending: Nurse Practitioner | Admitting: Nurse Practitioner

## 2019-07-07 ENCOUNTER — Other Ambulatory Visit: Payer: Self-pay

## 2019-07-07 ENCOUNTER — Encounter: Payer: Self-pay | Admitting: Nurse Practitioner

## 2019-07-07 ENCOUNTER — Ambulatory Visit: Payer: Medicare Other | Admitting: Nurse Practitioner

## 2019-07-07 VITALS — BP 107/66 | HR 113 | Temp 95.9°F | Ht 62.0 in | Wt 147.6 lb

## 2019-07-07 DIAGNOSIS — R1310 Dysphagia, unspecified: Secondary | ICD-10-CM

## 2019-07-07 DIAGNOSIS — K5900 Constipation, unspecified: Secondary | ICD-10-CM | POA: Diagnosis not present

## 2019-07-07 DIAGNOSIS — R197 Diarrhea, unspecified: Secondary | ICD-10-CM

## 2019-07-07 DIAGNOSIS — R159 Full incontinence of feces: Secondary | ICD-10-CM | POA: Diagnosis present

## 2019-07-07 HISTORY — DX: Full incontinence of feces: R15.9

## 2019-07-07 IMAGING — DX DG ABDOMEN 2V
3 series · 3 of 3 positions shown · non-contrast
Comparison: CT abdomen/pelvis dated [DATE]

CLINICAL DATA: Constipation, evaluate stool burden

EXAM:
ABDOMEN - 2 VIEW

[abdomen supine (1 of 2)]
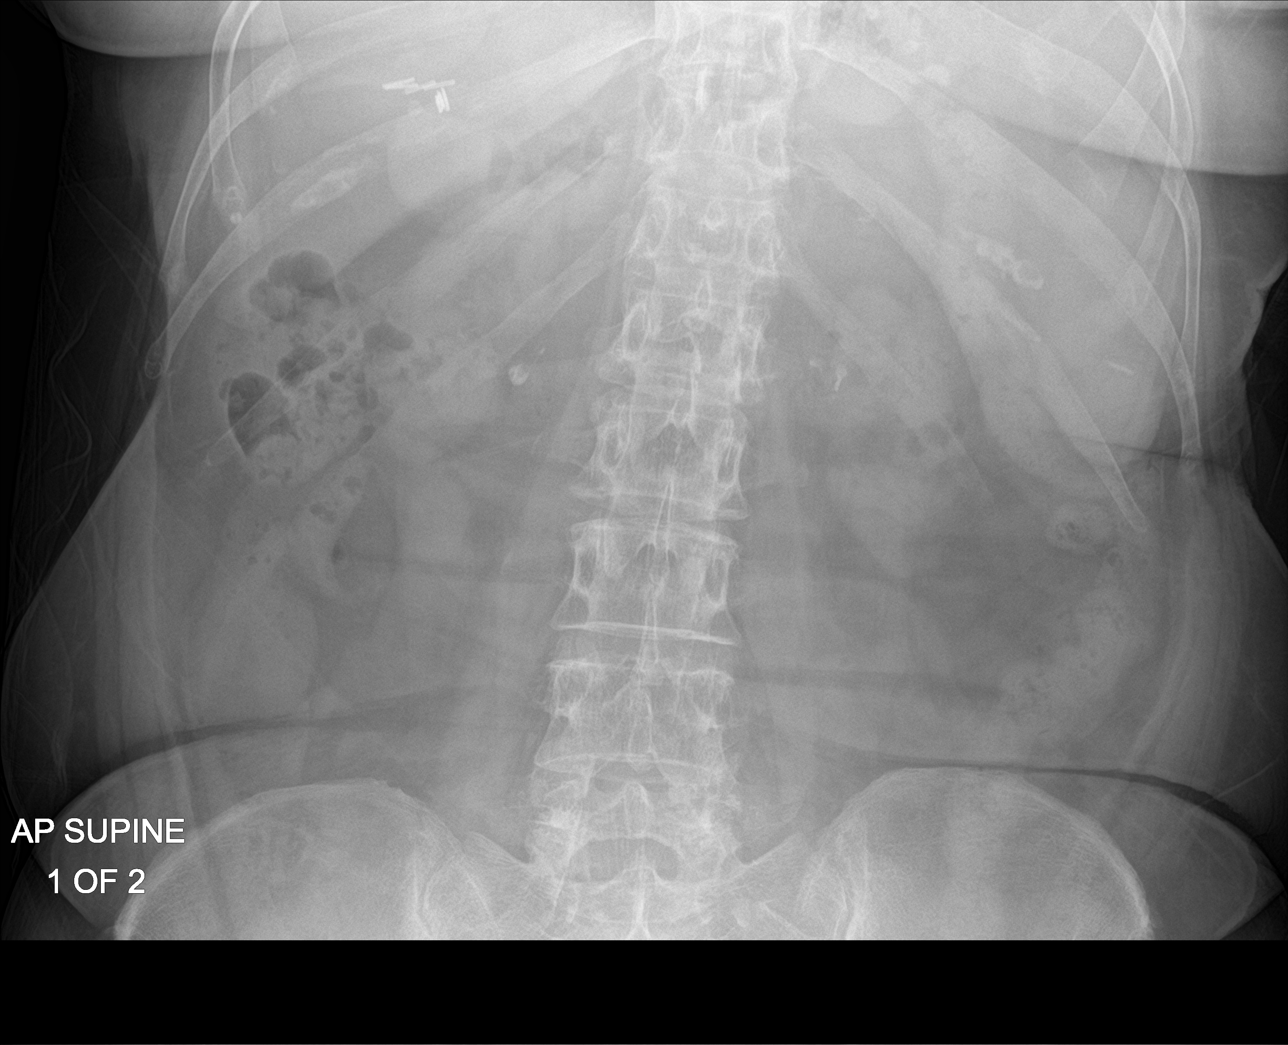

[abdomen supine (2 of 2)]
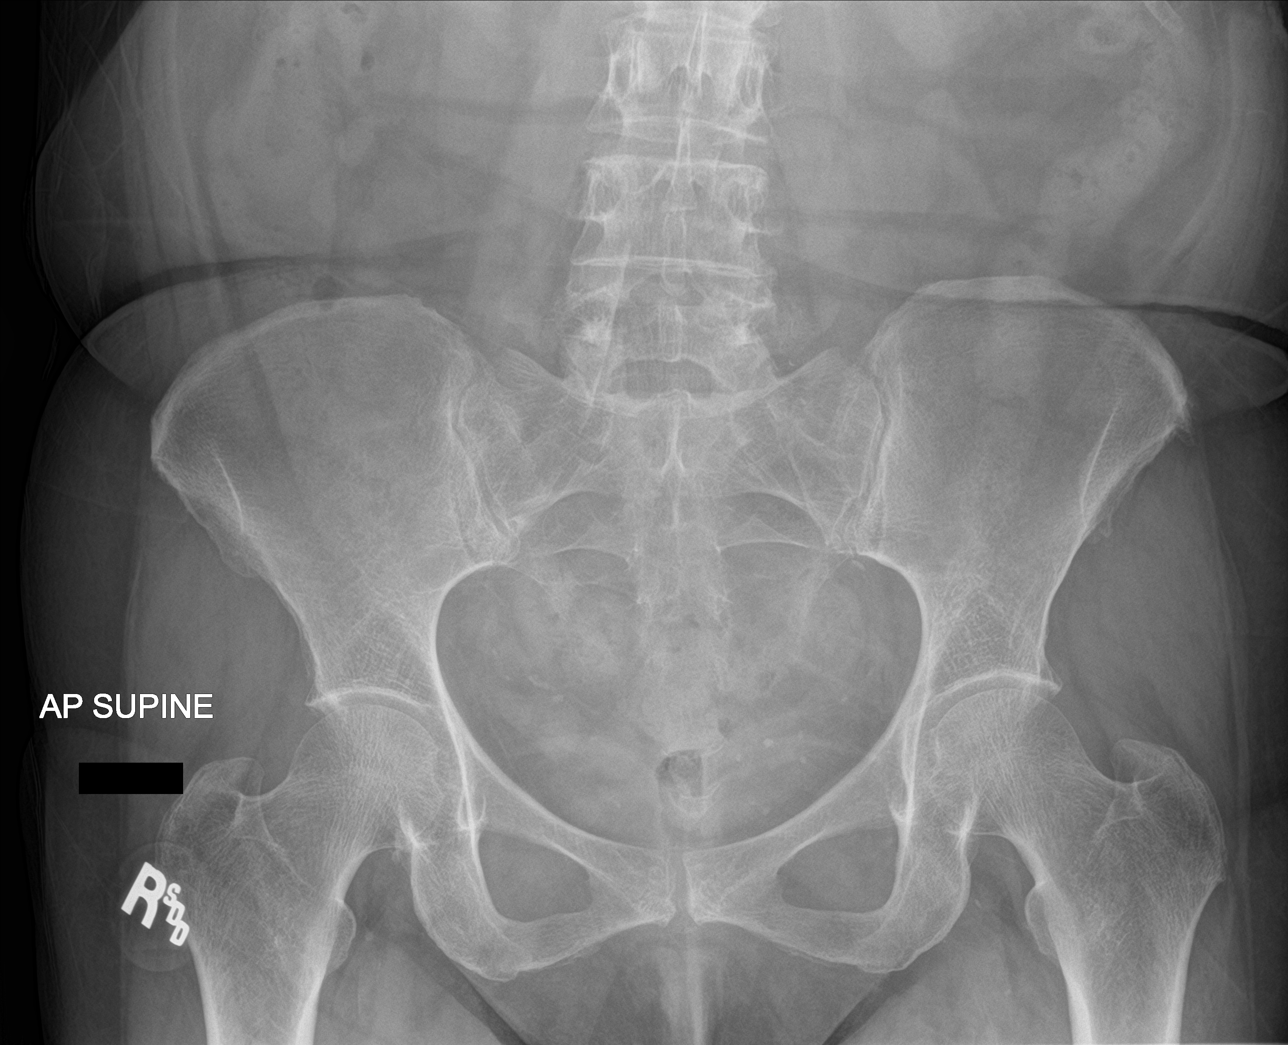

[abdomen erect]
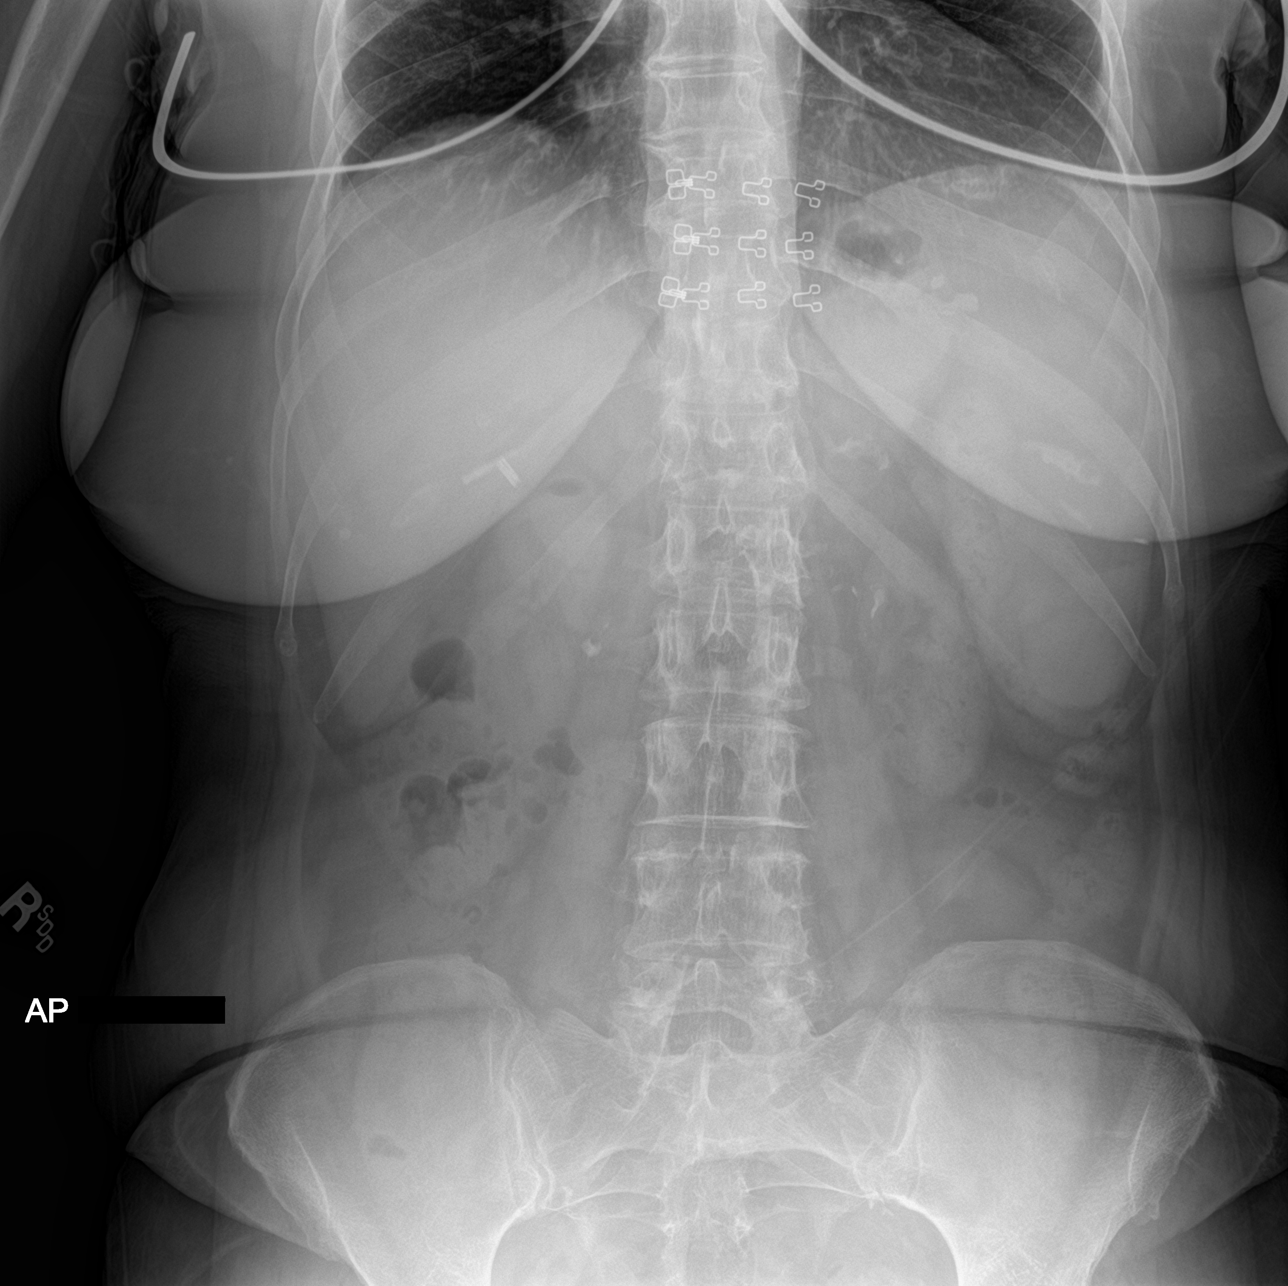

[3 of 3 positions shown; findings below may reference images not displayed]

FINDINGS: Nonobstructive bowel gas pattern.

Normal colonic stool burden.

Cholecystectomy clips.

Visualized osseous structures are within normal limits.
IMPRESSION: Normal colonic stool burden.

## 2019-07-07 MED ORDER — CHOLESTYRAMINE 4 G PO PACK: PACK | ORAL | 3 refills | Status: DC

## 2019-07-07 NOTE — Patient Instructions (Signed)
Your health issues we discussed today were:   Persistent diarrhea with fecal incontinence: 1. Have your abdominal x-ray completed when you are able to sleep 2. I have sent a prescription for Questran (cholestyramine) 4 g.  Start by taking this once a day for 7 days, then twice a day for 7 days, then 3 times a day moving forward. 3. We discussed further evaluation as needed at your follow-up, depending on how you do moving forward 4. Call us for any worsening or severe symptoms  Overall I recommend:  1. Continue your other current medications 2. Call us if you have any questions or concerns 3. Return for follow-up in 2 months.   Because of recent events of COVID-19 ("Coronavirus"), follow CDC recommendations:  Wash your hand frequently Avoid touching your face Stay away from people who are sick If you have symptoms such as fever, cough, shortness of breath then call your healthcare provider for further guidance If you are sick, STAY AT HOME unless otherwise directed by your healthcare provider. Follow directions from state and national officials regarding staying safe   At [facility] we value your feedback. You may receive a survey about your visit today. Please share your experience as we strive to create trusting relationships with our patients to provide genuine, compassionate, quality care.  We appreciate your understanding and patience as we review any laboratory studies, imaging, and other diagnostic tests that are ordered as we care for you. Our office policy is 5 business days for review of these results, and any emergent or urgent results are addressed in a timely manner for your best interest. If you do not hear from our office in 1 week, please contact us.   We also encourage the use of MyChart, which contains your medical information for your review as well. If you are not enrolled in this feature, an access code is on this after visit summary for your  convenience. Thank you for allowing [place] to be involved in your care.  It was great to see you today!  I hope you have a great Fall!!

## 2019-07-07 NOTE — Progress Notes (Signed)
Referring Provider: Ma Hillock, DO Primary Care Physician:  Ma Hillock, DO Primary GI:  Dr. Oneida Alar  Chief Complaint  Patient presents with  . Abdominal Pain    right side and lower abd  . Diarrhea    HPI:   Elizabeth Mcintyre is a 78 y.o. female who presents for follow-up on diarrhea.  Patient was last seen in our office 04/26/2019 for diarrhea, lower abdominal pain, bloating.  Colonoscopy up-to-date next due in 2022.  HIDA on file 2018 with diminished gallbladder EF of 60% for which he underwent laparoscopic cholecystectomy in 2018.  He was referred to our office for diarrhea with intermittent association of lower crampy abdominal pain.  GI path panel is negative, CBC and CMP normal, TSH mildly elevated.  Diabetes with generally good control with last hemoglobin A1c stable at 6.5.  At her last visit her diarrhea had improved, still on a probiotic.  She does still have some diarrhea 1-2 times a week but it is much less frequent and she can tell when it is going to happen.  Still with some generalized abdominal pain and evening time bloating.  States "feels like something is catching" when she bends forward, no improvement with a bowel movement.  Minimal GERD symptoms.  Stopped coffee and sodas which helped some but not significantly.  Recently prescribed Bentyl by primary care but has not started it yet.  No other GI complaints.  Recommended using Bentyl as needed for diarrhea, celiac labs, dairy avoidance, CT of the abdomen and pelvis, dicyclomine as needed.  Follow-up in 2 months.  CT of the abdomen and pelvis completed 05/10/2019 found new hepatic steatosis compared to prior exam, new splenomegaly compared to prior exam currently measuring 16.2 cm, large stool burden.  Recommended MiraLAX 1-2 times a day for the next 5 days to help have a good bowel movement.  After not taking as needed.  Call for any questions or concerns.  Today she states she's doing ok overall. She still has  diarrhea, occurs every 2-3 days, will have up to 7 stools a day. Has urgency and will start "running out" without ability to control. She states she has had diarrhea since her gallbladder removal in 2018. Denies hematochezia, melena. Denies fever, chills, weight loss. Rare abdominal pain, mild soreness and "gurgling" but not overtly bothersome. Denies URI or flu-like symptoms. Denies loss of sense of taste or smell. Denies chest pain, dyspnea, dizziness, lightheadedness, syncope, near syncope. Denies any other upper or lower GI symptoms.   She stated near the end of her visit that occasionally she'll eat something (such as cottage cheese) and by the time it gets to her stomach it comes back up; denies dysphagia symptoms.  Past Medical History:  Diagnosis Date  . Asthma   . 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 DIABETES MELLITUS, TYPE II 11/09/2007   diet control  . Diverticulosis 03/2011  . GERD (gastroesophageal reflux disease)   . GI bleed   . History of rheumatoid arthritis    during 30's, was treated.  . Hyperlipidemia   . Hypertension   . Osteopenia 2017   Last  bone density 05/04/2017: -2.4  . PONV (postoperative nausea and vomiting)   . Stress incontinence     Past Surgical History:  Procedure Laterality Date  . ABDOMINAL HYSTERECTOMY    . CARDIAC CATHETERIZATION     X 2, last one in 1998  .  CHOLECYSTECTOMY N/A 04/13/2017   Procedure: LAPAROSCOPIC CHOLECYSTECTOMY;  Surgeon: Aviva Signs, MD;  Location: [facility];  Service: General;  Laterality: N/A;  . COLONOSCOPY    . COLONOSCOPY  May 2012   Dr. Olevia Perches: mild diverticulosis, otherwise normal.   . ESOPHAGOGASTRODUODENOSCOPY N/A 01/28/2015   Dr. Gala Romney: reflux esophagitis, Schatzki's ring not manipulated due to recent bleeding  . ESOPHAGOGASTRODUODENOSCOPY N/A 03/30/2015   Dr. Gala Romney: Schatzki's ring s/p Venia Minks dilation, previously noted esophageal ulcer completely healed  . MALONEY  DILATION N/A 03/30/2015   Procedure: Venia Minks DILATION;  Surgeon: Daneil Dolin, MD;  Location: [facility];  Service: Endoscopy;  Laterality: N/A;    Current Outpatient Medications  Medication Sig Dispense Refill  . acetaminophen (TYLENOL) 325 MG tablet Take 650 mg by mouth every 6 (six) hours as needed for moderate pain.     Marland Kitchen albuterol (VENTOLIN HFA) 108 (90 Base) MCG/ACT inhaler Inhale 2 puffs into the lungs every 6 (six) hours as needed for wheezing. 8.5 g 2  . amLODipine (NORVASC) 10 MG tablet Take 1 tablet (10 mg total) by mouth daily. 90 tablet 1  . Blood Glucose Monitoring Suppl (ONE TOUCH ULTRA SYSTEM KIT) W/DEVICE KIT 1 kit by Does not apply route once. 1 each 0  . dicyclomine (BENTYL) 10 MG capsule TAKE 1 CAPSULE (10 MG TOTAL) BY MOUTH 4 (FOUR) TIMES DAILY - BEFORE MEALS AND AT BEDTIME. AS NEEDED FOR CRAMPING (Patient taking differently: Take 10 mg by mouth as needed. As needed for cramping) 360 capsule 1  . escitalopram (LEXAPRO) 10 MG tablet Take 1 tablet (10 mg total) by mouth daily. 90 tablet 1  . glucose blood (ONE TOUCH ULTRA TEST) test strip Use 1 test strip to test blood sugar levels 3 times daily. Dx. E11.9 100 each 12  . levothyroxine (SYNTHROID) 75 MCG tablet Take 1 tablet (75 mcg total) by mouth daily. 90 tablet 3  . ONETOUCH DELICA LANCETS FINE MISC Test twice daily. 180 each 2  . pantoprazole (PROTONIX) 40 MG tablet Take 1 tablet (40 mg total) by mouth daily before breakfast. 90 tablet 3  . triamcinolone cream (KENALOG) 0.1 % Apply 1 application topically 2 (two) times daily. For 2-4 weeks as needed 45 g 2  . vitamin B-12 (CYANOCOBALAMIN) 1000 MCG tablet Take 1,000 mcg by mouth daily.    . Vitamin D, Ergocalciferol, (DRISDOL) 1.25 MG (50000 UT) CAPS capsule Take 1 capsule (50,000 Units total) by mouth every 7 (seven) days. 12 capsule 0   No current facility-administered medications for this visit.     Allergies as of 07/07/2019 - Review Complete 07/07/2019   Allergen Reaction Noted  . Codeine phosphate Nausea And Vomiting and Rash     Family History  Problem Relation Age of Onset  . Heart disease Mother   . Osteoarthritis Mother   . Sudden death Father   . Single kidney Father   . Other Father        h/o severe MVA injuries  . Hyperlipidemia Sister   . Other Daughter        Myalgias  . Fibromyalgia Daughter   . Allergies Daughter   . Heart disease Maternal Grandfather   . Sudden death Paternal Grandmother   . Diabetes Paternal Grandfather   . Heart disease Daughter   . Other Daughter        palpitations  . Pulmonary fibrosis Maternal Aunt   . Cancer Paternal Uncle   . Pulmonary fibrosis Maternal Aunt   . Colon  cancer Neg Hx     Social History   Socioeconomic History  . Marital status: Widowed    Spouse name: Not on file  . Number of children: 2  . Years of education: Not on file  . Highest education level: Not on file  Occupational History  . Occupation: retired  Scientific laboratory technician  . Financial resource strain: Not on file  . Food insecurity    Worry: Not on file    Inability: Not on file  . Transportation needs    Medical: Not on file    Non-medical: Not on file  Tobacco Use  . Smoking status: Never Smoker  . Smokeless tobacco: Never Used  Substance and Sexual Activity  . Alcohol use: No  . Drug use: No  . Sexual activity: Never  Lifestyle  . Physical activity    Days per week: Not on file    Minutes per session: Not on file  . Stress: Not on file  Relationships  . Social Herbalist on phone: Not on file    Gets together: Not on file    Attends religious service: Not on file    Active member of club or organization: Not on file    Attends meetings of clubs or organizations: Not on file    Relationship status: Not on file  Other Topics Concern  . Not on file  Social History Narrative   Ms. Romanoff is widowed. Her young grandson lives with her, for whom she shares custody with her daughter, the  son's aunt.      Review of Systems: General: Negative for anorexia, weight loss, fever, chills, fatigue, weakness. ENT: Negative for hoarseness, difficulty swallowing CV: Negative for chest pain, angina, palpitations, peripheral edema.  Respiratory: Negative for dyspnea at rest, cough, sputum, wheezing.  GI: See history of present illness. Endo: Negative for unusual weight change.  Heme: Negative for bruising or bleeding. Allergy: Negative for rash or hives.   Physical Exam: BP 107/66   Pulse (!) 113   Temp (!) 95.9 F (35.5 C) (Temporal)   Ht '5\' 2"'$  (1.575 m)   Wt 147 lb 9.6 oz (67 kg)   BMI 27.00 kg/m  General:   Alert and oriented. Pleasant and cooperative. Well-nourished and well-developed.  Eyes:  Without icterus, sclera clear and conjunctiva pink.  Ears:  Normal auditory acuity. Cardiovascular:  S1, S2 present without murmurs appreciated. Extremities without clubbing or edema. Respiratory:  Clear to auscultation bilaterally. No wheezes, rales, or rhonchi. No distress.  Gastrointestinal:  +BS, soft, non-tender and non-distended. No HSM noted. No guarding or rebound. No masses appreciated.  Rectal:  Deferred  Musculoskalatal:  Symmetrical without gross deformities. Neurologic:  Alert and oriented x4;  grossly normal neurologically. Psych:  Alert and cooperative. Normal mood and affect. Heme/Lymph/Immune: No excessive bruising noted.    07/07/2019 2:47 PM   Disclaimer: This note was dictated with voice recognition software. Similar sounding words can inadvertently be transcribed and may not be corrected upon review.

## 2019-07-12 ENCOUNTER — Telehealth: Payer: Self-pay | Admitting: Nurse Practitioner

## 2019-07-12 DIAGNOSIS — R1310 Dysphagia, unspecified: Secondary | ICD-10-CM | POA: Insufficient documentation

## 2019-07-12 NOTE — Assessment & Plan Note (Signed)
Fecal incontinence with diarrhea likely related to loose stools rather than sphincter dysfunction.  If she continues to have incontinence we can consider referring her for anal manometry at her next visit, depending on how her diarrhea improves with other recommendations.  Follow-up in 2 months.  Call for any worsening or persistence of symptoms.

## 2019-07-12 NOTE — Telephone Encounter (Signed)
Please tell the patient I reviewed her x-ray which shows a normal stool burden.  Essentially, she does not have constipation causing overflow diarrhea.  Continue taking cholestyramine as we recommended previously.  Call us in 1 to 2 weeks and let us know if this is helping with her diarrhea at all.  Call if she has any questions or concerns.

## 2019-07-12 NOTE — Assessment & Plan Note (Signed)
The patient describes intermittent diarrhea up to 7 stools a day with fecal incontinence.  She will go several days in between diarrhea episodes.  Previous CT showed significant stool burden.  I will recheck abdominal x-ray to see if she has constipation resulting in overflow diarrhea.  In the interim, since she has been having diarrhea ever since her cholecystectomy, I will start her on cholestyramine to see if this helps for possible bile salt diarrhea.  Follow-up in 2 months.  Call for any worsening symptoms.

## 2019-07-12 NOTE — Assessment & Plan Note (Signed)
At the end of her visit she described occasional, but not regular issues where when she eats something such as cottage cheese that by the time he gets her stomach it comes back up.  She does deny overt dysphagia symptoms.  I recommended she try to avoid trigger foods that cause this symptom.  Continue to monitor.  At her follow-up visit if she is continuing to have these difficulties we can consider testing such as barium pill esophagram for stricture/mechanical obstruction versus dysmotility.  Follow-up in 2 months.

## 2019-07-13 NOTE — Telephone Encounter (Signed)
LMOM to call.

## 2019-07-13 NOTE — Telephone Encounter (Signed)
Pt called back.  Informed pt that EG has reviewed her x-ray which showed a normal stool burden.  She was informed that she does not have constipation causing overflow diarrhea.  Advised her to continue taking cholestyramine as he recommended previously.  She is to call us in 1 to 2 weeks and let us know if this is helping with her diarrhea at all.  Advised her to call if she has any questions or concerns.  Pt voiced understanding.

## 2019-09-13 ENCOUNTER — Ambulatory Visit: Payer: Medicare Other | Admitting: Nurse Practitioner

## 2019-09-16 DIAGNOSIS — C801 Malignant (primary) neoplasm, unspecified: Secondary | ICD-10-CM

## 2019-09-16 HISTORY — DX: Malignant (primary) neoplasm, unspecified: C80.1

## 2019-10-05 ENCOUNTER — Ambulatory Visit: Payer: Medicare Other

## 2019-11-24 ENCOUNTER — Other Ambulatory Visit: Payer: Self-pay | Admitting: Family Medicine

## 2019-11-24 NOTE — Telephone Encounter (Signed)
Dr. Lucita Lora patient.

## 2019-11-25 NOTE — Telephone Encounter (Signed)
Please inform patient we have received request for refills on her Bentyl.  I have refilled this for her.  She is due for her 47-month follow-up at the end of this month.  Please schedule her for her 57-month chronic condition follow-up. Thank you

## 2019-11-25 NOTE — Telephone Encounter (Signed)
Left detailed message for patient to CB to schedule 6 month follow up.   Okay per DPR.

## 2019-12-09 ENCOUNTER — Ambulatory Visit: Payer: Medicare Other | Attending: Internal Medicine

## 2019-12-09 DIAGNOSIS — Z23 Encounter for immunization: Secondary | ICD-10-CM

## 2019-12-09 NOTE — Progress Notes (Signed)
   Covid-19 Vaccination Clinic  Name:  Elizabeth Mcintyre    MRN: [id number] DOB: [birth year]  12/09/2019  [name] was observed post Covid-19 immunization for 15 minutes without incident. She was provided with Vaccine Information Sheet and instruction to access the V-Safe system.   [name] was instructed to call 911 with any severe reactions post vaccine: Marland Kitchen Difficulty breathing  . Swelling of face and throat  . A fast heartbeat  . A bad rash all over body  . Dizziness and weakness   Immunizations Administered    Name Date Dose VIS Date Route   Moderna COVID-19 Vaccine 12/09/2019  1:24 PM 0.5 mL 08/16/2019 Intramuscular   Manufacturer: Moderna   Lot: [id number]   [id number]

## 2019-12-21 ENCOUNTER — Ambulatory Visit: Payer: Medicare Other

## 2019-12-26 ENCOUNTER — Other Ambulatory Visit: Payer: Self-pay

## 2019-12-29 ENCOUNTER — Ambulatory Visit (INDEPENDENT_AMBULATORY_CARE_PROVIDER_SITE_OTHER): Payer: Medicare Other | Admitting: Family Medicine

## 2019-12-29 ENCOUNTER — Encounter: Payer: Self-pay | Admitting: Family Medicine

## 2019-12-29 ENCOUNTER — Other Ambulatory Visit: Payer: Self-pay

## 2019-12-29 VITALS — BP 156/82 | HR 71 | Temp 97.9°F | Resp 16 | Ht 62.0 in | Wt 139.4 lb

## 2019-12-29 DIAGNOSIS — E559 Vitamin D deficiency, unspecified: Secondary | ICD-10-CM | POA: Diagnosis not present

## 2019-12-29 DIAGNOSIS — I10 Essential (primary) hypertension: Secondary | ICD-10-CM

## 2019-12-29 DIAGNOSIS — F339 Major depressive disorder, recurrent, unspecified: Secondary | ICD-10-CM

## 2019-12-29 DIAGNOSIS — E1122 Type 2 diabetes mellitus with diabetic chronic kidney disease: Secondary | ICD-10-CM

## 2019-12-29 DIAGNOSIS — E213 Hyperparathyroidism, unspecified: Secondary | ICD-10-CM | POA: Diagnosis not present

## 2019-12-29 DIAGNOSIS — N183 Chronic kidney disease, stage 3 unspecified: Secondary | ICD-10-CM

## 2019-12-29 DIAGNOSIS — R413 Other amnesia: Secondary | ICD-10-CM

## 2019-12-29 DIAGNOSIS — E1169 Type 2 diabetes mellitus with other specified complication: Secondary | ICD-10-CM

## 2019-12-29 DIAGNOSIS — E118 Type 2 diabetes mellitus with unspecified complications: Secondary | ICD-10-CM

## 2019-12-29 DIAGNOSIS — J4520 Mild intermittent asthma, uncomplicated: Secondary | ICD-10-CM

## 2019-12-29 DIAGNOSIS — E039 Hypothyroidism, unspecified: Secondary | ICD-10-CM

## 2019-12-29 DIAGNOSIS — R161 Splenomegaly, not elsewhere classified: Secondary | ICD-10-CM

## 2019-12-29 DIAGNOSIS — N2581 Secondary hyperparathyroidism of renal origin: Secondary | ICD-10-CM

## 2019-12-29 DIAGNOSIS — E782 Mixed hyperlipidemia: Secondary | ICD-10-CM

## 2019-12-29 DIAGNOSIS — R410 Disorientation, unspecified: Secondary | ICD-10-CM

## 2019-12-29 DIAGNOSIS — I6521 Occlusion and stenosis of right carotid artery: Secondary | ICD-10-CM

## 2019-12-29 LAB — TSH: TSH: 1.12 u[IU]/mL (ref 0.35–4.50)

## 2019-12-29 LAB — CBC
HCT: 37.1 % (ref 36.0–46.0)
Hemoglobin: 12.4 g/dL (ref 12.0–15.0)
MCHC: 33.5 g/dL (ref 30.0–36.0)
MCV: 80.7 fl (ref 78.0–100.0)
Platelets: 141 10*3/uL — ABNORMAL LOW (ref 150.0–400.0)
RBC: 4.6 Mil/uL (ref 3.87–5.11)
RDW: 15.6 % — ABNORMAL HIGH (ref 11.5–15.5)
WBC: 5.4 10*3/uL (ref 4.0–10.5)

## 2019-12-29 LAB — COMPREHENSIVE METABOLIC PANEL
ALT: 10 U/L (ref 0–35)
AST: 18 U/L (ref 0–37)
Albumin: 4.5 g/dL (ref 3.5–5.2)
Alkaline Phosphatase: 62 U/L (ref 39–117)
BUN: 23 mg/dL (ref 6–23)
CO2: 27 mEq/L (ref 19–32)
Calcium: 9.6 mg/dL (ref 8.4–10.5)
Chloride: 105 mEq/L (ref 96–112)
Creatinine, Ser: 1.49 mg/dL — ABNORMAL HIGH (ref 0.40–1.20)
GFR: 33.87 mL/min — ABNORMAL LOW (ref >60.00)
Glucose, Bld: 129 mg/dL — ABNORMAL HIGH (ref 70–99)
Potassium: 4.4 mEq/L (ref 3.5–5.1)
Sodium: 140 mEq/L (ref 135–145)
Total Bilirubin: 0.4 mg/dL (ref 0.2–1.2)
Total Protein: 6.9 g/dL (ref 6.0–8.3)

## 2019-12-29 LAB — B12 AND FOLATE PANEL
Folate: 8.8 ng/mL (ref >5.9)
Vitamin B-12: 243 pg/mL (ref 211–911)

## 2019-12-29 LAB — POCT GLYCOSYLATED HEMOGLOBIN (HGB A1C)
HbA1c POC (<> result, manual entry): 5.7 % (ref 4.0–5.6)
HbA1c, POC (controlled diabetic range): 5.7 % (ref 0.0–7.0)
HbA1c, POC (prediabetic range): 5.7 % (ref 5.7–6.4)
Hemoglobin A1C: 5.7 % — AB (ref 4.0–5.6)

## 2019-12-29 MED ORDER — AMLODIPINE BESYLATE 10 MG PO TABS: 10.0000 mg | ORAL_TABLET | Freq: Every day | ORAL | 1 refills | Status: DC

## 2019-12-29 NOTE — Progress Notes (Signed)
Patient ID: JOHNISHA LOUKS, female  DOB: 1941-07-20, 79 y.o.   MRN: [id number] Patient Care Team    Relationship Specialty Notifications Start End  Ma Hillock, DO PCP - General Family Medicine  06/08/19   Danie Binder, MD Consulting Physician Gastroenterology  01/29/15   Daneil Dolin, MD Consulting Physician Gastroenterology  05/14/15   Minus Breeding, MD Consulting Physician Cardiology  01/30/17   Annitta Needs, NP  Gastroenterology  03/13/17   Carlis Stable, NP Nurse Practitioner Gastroenterology  09/29/18     Chief Complaint  Patient presents with  . Hypertension     Pt has stopped depression meds and HTN meds. No updated eye exam, pt will call and schedule. Does not check sugars at home.   . Hypothyroidism  . Depression  . Diabetes    Subjective Aviya SHANEDRA LAVE is a 79 y.o.  female present for [facility] Mild intermittent asthma without complication Patient reports rare use/need of albuterol inhaler.  Essential hypertension, benign/hyperlipidemia/overweight/CKD 3 Pt reports non-compliance with amlodipine 10 mg daily.  She had been on losartan in the past that was discontinued secondary to dizziness. Blood pressures ranges at home are no longer routinely checked.  Patient denies chest pain, shortness of breath, dizziness or lower extremity edema.  .  Pt was prescribed statin but discontinued. BMP: 04/20/2019 GFR 37, creatinine 1.38. CBC: 04/20/2019 within normal limits Lipid: 06/08/2019 total cholesterol 176, HDL 36, LDL 94, triglycerides were nonfasting Diet: Low-sodium Exercise: Exercise routinely RF: Hypertension, hyperlipidemia, diabetes, overweight,, family history of heart disease  Acquired hypothyroidism Patient reports compliance with levothyroxine 75 mcg daily on an empty stomach.  Last TSH September 2020 was within normal range.   Major depression, recurrent, chronic (HCC) Patient has a history of depression.  She was prescribed Lexapro 10 mg daily but discontinued  medication since her last appointment.   Vitamin D deficiency/Osteopenia, unspecified location Patient has a history of vitamin D deficiency and osteopenia.  She reports she has been trying to take 2000 units of vitamin D daily.  Paresthesia/fatigue/memory changes Patient reports today she is taking the B12 supplementation when she remembers.  She still endorses feeling tired and she has  memory changes that she has noted over the last 3-4 months.  Patient states she has noticed she is becoming more confused and driving and has needed to call her daughter because she has become lost.  She states this is in areas where she used to be able to drive and other locations.  She has had headaches and dizziness over the last couple months and therefore had stopped many of her medications thinking it was the cause.  She reports she has been taking her thyroid medicine as directed. Prior note: Patient reports she is having some dizziness, fatigue and a strange paresthesia sensation located over her left anterior scalp for approximately 2 weeks.  She reports it will feel like there is pain and then it trickles down her face towards her temple.  Type 2 diabetes mellitus with complication (HCC) Pt reports she has been diet controlled denies numbness, tingling of extremities, hypo/hyperglycemic events or non-healing wounds.   PNA series: Completed 2018 Flu shot: Declined  (recommneded yearly) Urine microalbumin: We will collect next visit since now not taking ARB Foot exam: 04/20/2019 completed Eye exam: Overdue.  Encouraged yearly eye exams. A1c: Last A1c 6.5> 5.7 today    Gastroesophageal reflux disease with esophagitis/chronic bloating/chronic diarrhea Managed by gastroenterology.  Depression screen [facility] 2/9  12/29/2019 09/29/2018 01/25/2018 10/15/2017 03/13/2017  Decreased Interest 0 _0 0  Down, Depressed, Hopeless 0 _1 0  PHQ - 2 Score 0 _2 0  Altered sleeping _3 - -  Tired, decreased energy 0  2 1 - -  Change in appetite _4 - -  Feeling bad or failure about yourself  0 1 1 - -  Trouble concentrating 3 0 0 - -  Moving slowly or fidgety/restless 0 0 0 - -  Suicidal thoughts 0 3 0 - -  PHQ-9 Score _5 - -  Difficult doing work/chores Not difficult at all Somewhat difficult Somewhat difficult - -   No flowsheet data found.     Fall Risk  09/29/2018 10/15/2017 03/13/2017 01/29/2017 05/25/2015  Falls in the past year? 0 No No No No     Immunization History  Administered Date(s) Administered  . Influenza Split 07/16/2011  . Influenza, High Dose Seasonal PF 10/15/2017, 09/03/2018  . Influenza,inj,Quad PF,6+ Mos 06/09/2014, 10/15/2015  . Moderna SARS-COVID-2 Vaccination 12/09/2019  . Pneumococcal Conjugate-13 03/13/2017  . Pneumococcal Polysaccharide-23 10/27/2014  . Tdap 07/16/2011    No exam data present  Past Medical History:  Diagnosis Date  . Asthma   . 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 DIABETES MELLITUS, TYPE II 11/09/2007   diet control  . Diverticulosis 03/2011  . GERD (gastroesophageal reflux disease)   . GI bleed   . History of rheumatoid arthritis    during 30's, was treated.  . Hyperlipidemia   . Hypertension   . Osteopenia 2017   Last  bone density 05/04/2017: -2.4  . PONV (postoperative nausea and vomiting)   . Stress incontinence    Allergies  Allergen Reactions  . Codeine Phosphate Nausea And Vomiting and Rash   Past Surgical History:  Procedure Laterality Date  . ABDOMINAL HYSTERECTOMY    . CARDIAC CATHETERIZATION     X 2, last one in 1998  . CHOLECYSTECTOMY N/A 04/13/2017   Procedure: LAPAROSCOPIC CHOLECYSTECTOMY;  Surgeon: Aviva Signs, MD;  Location: [facility] ORS;  Service: General;  Laterality: N/A;  . COLONOSCOPY    . COLONOSCOPY  May 2012   Dr. Olevia Perches: mild diverticulosis, otherwise normal.   . ESOPHAGOGASTRODUODENOSCOPY N/A 01/28/2015   Dr. Gala Romney: reflux esophagitis, Schatzki's ring not  manipulated due to recent bleeding  . ESOPHAGOGASTRODUODENOSCOPY N/A 03/30/2015   Dr. Gala Romney: Schatzki's ring s/p Venia Minks dilation, previously noted esophageal ulcer completely healed  . MALONEY DILATION N/A 03/30/2015   Procedure: Venia Minks DILATION;  Surgeon: Daneil Dolin, MD;  Location: [facility] ENDO SUITE;  Service: Endoscopy;  Laterality: N/A;   Family History  Problem Relation Age of Onset  . Heart disease Mother   . Osteoarthritis Mother   . Sudden death Father   . Single kidney Father   . Other Father        h/o severe MVA injuries  . Hyperlipidemia Sister   . Other Daughter        Myalgias  . Fibromyalgia Daughter   . Allergies Daughter   . Heart disease Maternal Grandfather   . Sudden death Paternal Grandmother   . Diabetes Paternal Grandfather   . Heart disease Daughter   . Other Daughter        palpitations  . Pulmonary fibrosis Maternal Aunt   . Cancer Paternal Uncle   . Pulmonary fibrosis Maternal Aunt   .  Colon cancer Neg Hx    Social History   Social History Narrative   Ms. Hovland is widowed. Her young grandson lives with her, for whom she shares custody with her daughter, the son's aunt.      Allergies as of 12/29/2019      Reactions   Codeine Phosphate Nausea And Vomiting, Rash      Medication List       Accurate as of December 29, 2019 11:59 PM. If you have any questions, ask your nurse or doctor.        STOP taking these medications   escitalopram 10 MG tablet Commonly known as: Lexapro Stopped by: Howard Pouch, DO   glucose blood test strip Commonly known as: ONE TOUCH ULTRA TEST Stopped by: Howard Pouch, DO   Frannie KIT w/Device Kit Stopped by: Howard Pouch, DO   OneTouch Delica Lancets Fine Misc Stopped by: Howard Pouch, DO   Vitamin D (Ergocalciferol) 1.25 MG (50000 UNIT) Caps capsule Commonly known as: DRISDOL Stopped by: Howard Pouch, DO     TAKE these medications   acetaminophen 325 MG tablet Commonly known as:  TYLENOL Take 650 mg by mouth every 6 (six) hours as needed for moderate pain.   albuterol 108 (90 Base) MCG/ACT inhaler Commonly known as: VENTOLIN HFA Inhale 2 puffs into the lungs every 6 (six) hours as needed for wheezing.   amLODipine 10 MG tablet Commonly known as: NORVASC Take 1 tablet (10 mg total) by mouth daily.   cholestyramine 4 g packet Commonly known as: Questran Take 1 packet (4 g total) by mouth daily for 7 days, THEN 1 packet (4 g total) 2 (two) times daily for 7 days, THEN 1 packet (4 g total) 3 (three) times daily. Start taking on: July 07, 2019   dicyclomine 10 MG capsule Commonly known as: BENTYL Take 1 capsule (10 mg total) by mouth 4 (four) times daily -  before meals and at bedtime. Needs appt for further refills.   levothyroxine 75 MCG tablet Commonly known as: SYNTHROID Take 1 tablet (75 mcg total) by mouth daily.   pantoprazole 40 MG tablet Commonly known as: PROTONIX Take 1 tablet (40 mg total) by mouth daily before breakfast.   triamcinolone cream 0.1 % Commonly known as: KENALOG Apply 1 application topically 2 (two) times daily. For 2-4 weeks as needed   vitamin B-12 1000 MCG tablet Commonly known as: CYANOCOBALAMIN Take 1,000 mcg by mouth daily.   Vitamin D (Cholecalciferol) 25 MCG (1000 UT) Caps Take 2 capsules by mouth daily.       All past medical history, surgical history, allergies, family history, immunizations andmedications were updated in the EMR today and reviewed under the history and medication portions of their EMR.    Recent Results (from the past 2160 hour(s))  POCT glycosylated hemoglobin (Hb A1C)     Status: Abnormal   Collection Time: 12/29/19  9:37 AM  Result Value Ref Range   Hemoglobin A1C 5.7 (A) 4.0 - 5.6 %   HbA1c POC (<> result, manual entry) 5.7 4.0 - 5.6 %   HbA1c, POC (prediabetic range) 5.7 5.7 - 6.4 %   HbA1c, POC (controlled diabetic range) 5.7 0.0 - 7.0 %  PTH, Intact and Calcium     Status: Abnormal     Collection Time: 12/29/19 10:21 AM  Result Value Ref Range   PTH 76 (H) 14 - 64 pg/mL    Comment: . Interpretive Guide    Intact  PTH           Calcium ------------------    ----------           ------- Normal Parathyroid    Normal               Normal Hypoparathyroidism    Low or Low Normal    Low Hyperparathyroidism    Primary            Normal or High       High    Secondary          High                 Normal or Low    Tertiary           High                 High Non-Parathyroid    Hypercalcemia      Low or Low Normal    High .    Calcium 10.0 8.6 - 10.4 mg/dL  Vitamin D (25 hydroxy)     Status: Abnormal   Collection Time: 12/29/19 10:21 AM  Result Value Ref Range   Vit D, 25-Hydroxy 27 (L) 30 - 100 ng/mL    Comment: Vitamin D Status         25-OH Vitamin D: . Deficiency:                    <20 ng/mL Insufficiency:             20 - 29 ng/mL Optimal:                 > or = 30 ng/mL . For 25-OH Vitamin D testing on patients on  D2-supplementation and patients for whom quantitation  of D2 and D3 fractions is required, the Quest[employer]ureD(TM) 25-OH VIT D, (D2,D3), LC/MS/MS is recommended: order  code [id number] (patients >32yr). See Note 1 . Note 1 . For additional information, please refer to  http://education.Quest[facility].com/faq/FAQ199  (This link is being provided for informational/ educational purposes only.)   CBC     Status: Abnormal   Collection Time: 12/29/19 10:21 AM  Result Value Ref Range   WBC 5.4 4.0 - 10.5 K/uL   RBC 4.60 3.87 - 5.11 Mil/uL   Platelets 141.0 (L) 150.0 - 400.0 K/uL   Hemoglobin 12.4 12.0 - 15.0 g/dL   HCT 37.1 36.0 - 46.0 %   MCV 80.7 78.0 - 100.0 fl   MCHC 33.5 30.0 - 36.0 g/dL   RDW 15.6 (H) 11.5 - 15.5 %  TSH     Status: None   Collection Time: 12/29/19 10:21 AM  Result Value Ref Range   TSH 1.12 0.35 - 4.50 uIU/mL  Comp Met (CMET)     Status: Abnormal   Collection Time: 12/29/19 10:21 AM  Result Value Ref Range   Sodium 140  135 - 145 mEq/L   Potassium 4.4 3.5 - 5.1 mEq/L   Chloride 105 96 - 112 mEq/L   CO2 27 19 - 32 mEq/L   Glucose, Bld 129 (H) 70 - 99 mg/dL   BUN 23 6 - 23 mg/dL   Creatinine, Ser 1.49 (H) 0.40 - 1.20 mg/dL   Total Bilirubin 0.4 0.2 - 1.2 mg/dL   Alkaline Phosphatase 62 39 - 117 U/L   AST 18 0 - 37 U/L   ALT 10 0 - 35 U/L   Total Protein 6.9 6.0 - 8.3 g/dL  Albumin 4.5 3.5 - 5.2 g/dL   GFR 33.87 (L) >60.00 mL/min   Calcium 9.6 8.4 - 10.5 mg/dL  B12 and Folate Panel     Status: None   Collection Time: 12/29/19 10:21 AM  Result Value Ref Range   Vitamin B-12 243 211 - 911 pg/mL   Folate 8.8 >5.9 ng/mL  Urinalysis w microscopic + reflex cultur     Status: None   Collection Time: 12/29/19 10:21 AM   Specimen: Blood  Result Value Ref Range   Color, Urine YELLOW YELLOW   APPearance CLEAR CLEAR   Specific Gravity, Urine 1.014 1.001 - 1.03   pH 6.5 5.0 - 8.0   Glucose, UA NEGATIVE NEGATIVE   Bilirubin Urine NEGATIVE NEGATIVE   Ketones, ur NEGATIVE NEGATIVE   Hgb urine dipstick NEGATIVE NEGATIVE   Protein, ur NEGATIVE NEGATIVE   Nitrites, Initial NEGATIVE NEGATIVE   Leukocyte Esterase NEGATIVE NEGATIVE   WBC, UA NONE SEEN 0 - 5 /HPF   RBC / HPF 0-2 0 - 2 /HPF   Squamous Epithelial / LPF NONE SEEN < OR = 5 /HPF   Bacteria, UA NONE SEEN NONE SEEN /HPF   Hyaline Cast NONE SEEN NONE SEEN /LPF  Extra Specimen     Status: None   Collection Time: 12/29/19 10:21 AM  Result Value Ref Range   Extra tube recieved      Comment: An extra specimen was received with no test requested. The specimen will be maintained in storage in case  additional testing is needed. Please call the client service department for further assistance. Marland Kitchen    Specimen type recieved Frozen Serum   REFLEXIVE URINE CULTURE     Status: None   Collection Time: 12/29/19 10:21 AM  Result Value Ref Range   Reflexve Urine Culture      Comment: NO CULTURE INDICATED    ROS: 14 pt review of systems performed and  negative (unless mentioned in an HPI)  Objective: BP (!) 156/82 (BP Location: Left Arm, Patient Position: Sitting, Cuff Size: Normal)   Pulse 71   Temp 97.9 F (36.6 C) (Temporal)   Resp 16   Ht 5' 2" (1.575 m)   Wt 139 lb 6 oz (63.2 kg)   SpO2 98%   BMI 25.49 kg/m  Gen: Afebrile. No acute distress.  Nontoxic in presentation.  Well-developed, well-nourished, pleasant female. HENT: AT. [place].MMM.  No cough.  No shortness of breath. Eyes:Pupils Equal Round Reactive to light, Extraocular movements intact,  Conjunctiva without redness, discharge or icterus. Neck/lymp/endocrine: Supple, left submandibular lymphadenopathy present.  No thyromegaly CV: RRR no murmur, no edema Chest: CTAB, no wheeze or crackles Skin: no rashes, purpura or petechiae.  Neuro:  Normal gait. PERLA. EOMi. Alert. Oriented x3  Psych: Normal affect, dress and demeanor. Normal speech. Normal thought content and judgment.    No results found for this or any previous visit (from the past 24 hour(s)).  Assessment/plan: MILICA GULLY is a 79 y.o. female present for [facility] Mild intermittent asthma without complication Stable. Continue albuterol as needed.    Essential hypertension, benign/hyperlipidemia/carotid artery stenosis -Elevated above goal.  She is not taking her medications. -Restart amlodipine 10 mg daily. -Stop losartan, monitor blood pressures and if routinely above 135/85 we will add back losartan at 25 mg daily. -She had been on Crestor.  We will continue to hold this for now since she is having some memory concerns. -Low-sodium diet, routine exercise. -Refills provided today on the above medications. -  Follow-up 6 months unless needed sooner  Acquired hypothyroidism -Check levels today mostly secondary to confusion and memory deficits.  Will refill medications at appropriate dose once results received.  Monitor yearly. - TSH - T4, free  CKD stage 3 secondary (HCC)/vitamin D deficiency/secondary  hyperparathyroidism/osteopenia - Renally dose meds when appropriate. - Baseline GFR approximately 40, baseline creatinine 1.38 - Avoid NSAIDs - Vitamin D (25 hydroxy) -CMP - PTH, Intact and Calcium Continue vitamin D 2000 units daily  Major depression, recurrent, chronic ([place]) Patient has stopped the Lexapro.  We will continue to hold for now.  May consider adding back at a later date depending upon results of her memory deficit work-up.  Type 2 diabetes mellitus with complication (HCC) Last J6E 6.20 April 2019 >5.7 today. She has been diet controlled, per prior PCP notes.  Gastroesophageal reflux disease with esophagitis Managed by gastroenterology.   Memory deficit/confusion Patient reports new onset memory deficits and confusion that are concerning.  Will initiate work-up today with vitamin deficiencies, recheck of thyroid and urinalysis.  We will bring patient back for close follow-up to discuss in more detail. -She has endorsed paresthesias, headache, dizziness, fatigue, confusion, abdominal pain with new splenomegaly per CT 04/2019, abnormal mammogram with 2-monthrepeat scheduled next month and left submandibular lymphadenopathy all noted within the last year per EMR review.  Will review all labs with her at her follow-up and move forward with work-up. -CBC - TSH - B12 and Folate Panel - Urinalysis w microscopic + reflex culture    Return in about 2 weeks (around 01/12/2020) for blood pressure and memory.. Orders Placed This Encounter  Procedures  . PTH, Intact and Calcium  . Vitamin D (25 hydroxy)  . CBC  . TSH  . Comp Met (CMET)  . B12 and Folate Panel  . Urinalysis w microscopic + reflex cultur  . Extra Specimen  . REFLEXIVE URINE CULTURE  . POCT glycosylated hemoglobin (Hb A1C)   Meds ordered this encounter  Medications  . amLODipine (NORVASC) 10 MG tablet    Sig: Take 1 tablet (10 mg total) by mouth daily.    Dispense:  90 tablet    Refill:  1    Please dc  all other amlodipine scripts.  .Marland Kitchenalbuterol (VENTOLIN HFA) 108 (90 Base) MCG/ACT inhaler    Sig: Inhale 2 puffs into the lungs every 6 (six) hours as needed for wheezing.    Dispense:  8.5 g    Refill:  2    Please hold until pt request.   Referral Orders  No referral(s) requested today    Greater than 55 minutes was spent with patient, reviewing prior imaging, trending laboratory results, treating multiple chronic conditions as well as new acute conditions.  Note is dictated utilizing voice recognition software. Although note has been proof read prior to signing, occasional typographical errors still can be missed. If any questions arise, please do not hesitate to call for verification.  Electronically signed by: RHoward Pouch DO [place]

## 2019-12-29 NOTE — Patient Instructions (Addendum)
1. Start your amlodipine 10 mg, once a day.  2. Continue your Thyroid medication, every morning on an empty stomach.  3. We will call you with lab results and discuss your vitamin doses once we know more.  4. Follow up in 2 weeks. Make sure to take your blood pressure pill at least 2 hours before your appt.  We will also discuss your memory changes in more detail that appt after we have all the labs back.   I have included some reading material on dementia types.   Dementia Dementia is a condition that affects the way the brain works. It often affects memory and thinking. There are many types of dementia. Some types get worse with time and cannot be reversed. Some types of dementia include:  Alzheimer's disease. This is the most common type.  Vascular dementia. This type may happen due to a stroke.  Lewy body dementia. This type may happen to people who have Parkinson's disease.  Frontotemporal dementia. This type is caused by damage to nerve cells in certain parts of the brain. Some people may have more than one type, and this is called mixed dementia. What are the causes? This condition is caused by damage to cells in the brain. Some causes that cannot be reversed include:  Having a condition that affects the blood vessels of the brain, such as diabetes, heart disease, or blood vessel disease.  Changes to genes. Some causes that can be reversed or slowed include:  Injury to the brain.  Certain medicines.  Infection.  Not having enough vitamin B12 in the body, or thyroid problems.  A tumor or blood clot in the brain. What are the signs or symptoms? Symptoms depend on the type of dementia. This may include:  Problems remembering things.  Having trouble taking a bath or putting clothes on.  Forgetting appointments.  Forgetting to pay bills.  Trouble planning and making meals.  Having trouble speaking.  Getting lost easily. How is this treated? Treatment depends on  the cause of the dementia. It might include taking medicines that help:  To control the dementia.  To slow down the dementia.  To manage symptoms. In some cases, treating the cause of your dementia can improve symptoms, reverse symptoms, or slow down how quickly it gets worse. Your doctor can help you find support groups and other doctors who can help with your care. Follow these instructions at home: Medicines  Take over-the-counter and prescription medicines only as told by your doctor.  Use a pill organizer to help you manage your medicines.  Avoidtaking medicines for pain or for sleep. Lifestyle  Make healthy choices: ? Be active as told by your doctor. ? Do not use any products that contain nicotine or tobacco, such as cigarettes, e-cigarettes, and chewing tobacco. If you need help quitting, ask your doctor. ? Do not drink alcohol. ? When you get stressed, do something that will help you to relax. Your doctor can give you tips. ? Spend time with other people.  Make sure you get good sleep. To get good sleep: ? Try not to take naps during the day. ? Keep your bedroom dark and cool. ? In the few hours before you go to bed, try not to do any exercise. ? Do not have foods and drinks with caffeine at night. Eating and drinking  Drink enough fluid to keep your pee (urine) pale yellow.  Eat a healthy diet. General instructions   Talk with your doctor to  figure out: ? What you need help with. ? What your safety needs are.  Ask your doctor if it is safe for you to drive.  If told, wear a bracelet that tracks where you are or shows that you are a person with memory loss.  Work with your family to make big decisions.  Keep all follow-up visits as told by your doctor. This is important. Contact a doctor if:  You have any new symptoms.  Your symptoms get worse.  You have problems with swallowing or choking. Get help right away if:  You feel very sad, or feel that you  want to harm yourself.  You or your family members are worried for your safety. If you ever feel like you may hurt yourself or others, or have thoughts about taking your own life, get help right away. You can go to your nearest emergency department or call:  Your local emergency services (911 in the [place]  A suicide crisis helpline, such as the [place] at 419-702-5158. This is open 24 hours a day. Summary  Dementia often affects memory and thinking.  Some types of dementia get worse with time and cannot be reversed.  Treatment for this condition depends on the cause.  Talk with your doctor to figure out what you need help with.  Your doctor can help you find support groups and other doctors who can help with your care. This information is not intended to replace advice given to you by your health care provider. Make sure you discuss any questions you have with your health care provider. Document Revised: 11/16/2018 Document Reviewed: 11/16/2018 Elsevier Patient Education  [place].

## 2019-12-30 LAB — URINALYSIS W MICROSCOPIC + REFLEX CULTURE
Bacteria, UA: NONE SEEN /HPF
Bilirubin Urine: NEGATIVE
Glucose, UA: NEGATIVE
Hgb urine dipstick: NEGATIVE
Hyaline Cast: NONE SEEN /LPF
Ketones, ur: NEGATIVE
Leukocyte Esterase: NEGATIVE
Nitrites, Initial: NEGATIVE
Protein, ur: NEGATIVE
Specific Gravity, Urine: 1.014 (ref 1.001–1.03)
Squamous Epithelial / HPF: NONE SEEN /HPF (ref <=5)
WBC, UA: NONE SEEN /HPF (ref 0–5)
pH: 6.5 (ref 5.0–8.0)

## 2019-12-30 LAB — EXTRA SPECIMEN

## 2019-12-30 LAB — VITAMIN D 25 HYDROXY (VIT D DEFICIENCY, FRACTURES): Vit D, 25-Hydroxy: 27 ng/mL — ABNORMAL LOW (ref 30–100)

## 2019-12-30 LAB — PTH, INTACT AND CALCIUM
Calcium: 10 mg/dL (ref 8.6–10.4)
PTH: 76 pg/mL — ABNORMAL HIGH (ref 14–64)

## 2019-12-30 LAB — NO CULTURE INDICATED

## 2020-01-03 ENCOUNTER — Telehealth: Payer: Self-pay | Admitting: Family Medicine

## 2020-01-03 MED ORDER — ALBUTEROL SULFATE HFA 108 (90 BASE) MCG/ACT IN AERS: 2.0000 | INHALATION_SPRAY | Freq: Four times a day (QID) | RESPIRATORY_TRACT | 2 refills | Status: DC | PRN

## 2020-01-03 NOTE — Telephone Encounter (Signed)
Please call patient: Kidney function is stable for her. Vitamin D levels are mildly low at 27> she reported taking 2000 units of vitamin D daily.  If she has been routinely taking 2000 units I would encourage her to increase to 3000 units.   Her B12 levels are extremely low at 243> she had reported taking 1000 mcg of B12 supplementation a day if she has been taking this routinely, we will need to set her up for B12 injections.  If she has not been taking her B12 routinely please have her restart immediately.  We will discuss in more detail at her appointment coming up. Blood cell counts are stable. Her urine was normal.  She has an appointment next week and we will discuss all of her labs in more detail along with addressing her memory changes.

## 2020-01-04 ENCOUNTER — Encounter: Payer: Self-pay | Admitting: Family Medicine

## 2020-01-04 DIAGNOSIS — D696 Thrombocytopenia, unspecified: Secondary | ICD-10-CM

## 2020-01-04 DIAGNOSIS — K760 Fatty (change of) liver, not elsewhere classified: Secondary | ICD-10-CM | POA: Insufficient documentation

## 2020-01-04 DIAGNOSIS — R410 Disorientation, unspecified: Secondary | ICD-10-CM | POA: Insufficient documentation

## 2020-01-04 DIAGNOSIS — R161 Splenomegaly, not elsewhere classified: Secondary | ICD-10-CM

## 2020-01-04 HISTORY — DX: Fatty (change of) liver, not elsewhere classified: K76.0

## 2020-01-04 HISTORY — DX: Thrombocytopenia, unspecified: D69.6

## 2020-01-04 HISTORY — DX: Splenomegaly, not elsewhere classified: R16.1

## 2020-01-04 NOTE — Telephone Encounter (Signed)
Pt called. VM left results. Advised patient to call back to set up B12 shots.

## 2020-01-13 ENCOUNTER — Ambulatory Visit: Payer: Medicare Other | Admitting: Family Medicine

## 2020-01-20 ENCOUNTER — Other Ambulatory Visit: Payer: Self-pay

## 2020-01-20 ENCOUNTER — Ambulatory Visit (INDEPENDENT_AMBULATORY_CARE_PROVIDER_SITE_OTHER): Payer: Medicare Other | Admitting: Family Medicine

## 2020-01-20 ENCOUNTER — Encounter: Payer: Self-pay | Admitting: Family Medicine

## 2020-01-20 VITALS — BP 136/62 | HR 82 | Temp 98.0°F | Resp 16 | Ht 62.0 in | Wt 138.4 lb

## 2020-01-20 DIAGNOSIS — R591 Generalized enlarged lymph nodes: Secondary | ICD-10-CM

## 2020-01-20 DIAGNOSIS — I6521 Occlusion and stenosis of right carotid artery: Secondary | ICD-10-CM | POA: Diagnosis not present

## 2020-01-20 DIAGNOSIS — G309 Alzheimer's disease, unspecified: Secondary | ICD-10-CM

## 2020-01-20 DIAGNOSIS — R161 Splenomegaly, not elsewhere classified: Secondary | ICD-10-CM

## 2020-01-20 DIAGNOSIS — D696 Thrombocytopenia, unspecified: Secondary | ICD-10-CM | POA: Diagnosis not present

## 2020-01-20 DIAGNOSIS — R519 Headache, unspecified: Secondary | ICD-10-CM

## 2020-01-20 DIAGNOSIS — R4189 Other symptoms and signs involving cognitive functions and awareness: Secondary | ICD-10-CM | POA: Diagnosis not present

## 2020-01-20 DIAGNOSIS — K146 Glossodynia: Secondary | ICD-10-CM

## 2020-01-20 MED ORDER — NYSTATIN 100000 UNIT/ML MT SUSP: 5.0000 mL | Freq: Four times a day (QID) | OROMUCOSAL | 0 refills | Status: DC

## 2020-01-20 NOTE — Patient Instructions (Addendum)
B12 take 1000 mcg a day> get the under tongue liquid for better absorption.  Make sure to have your mammogram done 5/20 and have them send to me.  Continue vit d 3000 units a day. We can start aricept to help slow down memory changes.   I want to get an image of your brain and and throat.   Nystatin swish- swish 4 times a day for 5 days for burning tongue

## 2020-01-20 NOTE — Progress Notes (Signed)
Patient ID: Elizabeth Mcintyre, female  DOB: [birth year], 79 y.o.   MRN: [id number] Patient Care Team    Relationship Specialty Notifications Start End  Ma Hillock, DO PCP - General Family Medicine  06/08/19   Danie Binder, MD Consulting Physician Gastroenterology  01/29/15   Daneil Dolin, MD Consulting Physician Gastroenterology  05/14/15   Minus Breeding, MD Consulting Physician Cardiology  01/30/17   Annitta Needs, NP  Gastroenterology  03/13/17   Carlis Stable, NP Nurse Practitioner Gastroenterology  09/29/18     Chief Complaint  Patient presents with  . Follow-up    Review labs and memory issues     Subjective Elizabeth Mcintyre is a 79 y.o.  female present for [facility] Paresthesia/fatigue/cognitive decline: Patient reports today she is taking the B12 1000 units daily.  Last B12 December 29, 2019 243.  Vitamin D was 27 and she is taking 3000 units of vitamin D daily as well.  She is still having paresthesias, and feeling fatigued, having headaches mostly on the left parietal area.  She has been having notable cognitive decline over the last 4 months.  She states it has been advancing and she is having trouble driving secondary to not knowing where she is and getting lost frequently.  She states she is having to call her family to help navigate her way back.  She reports she does not really cook anymore because she does not recall how to make the meals she used to prepare.  She denies leaving or forgetting the stove is on.  She states she has lost money within her home.  She had a few $100 bills and wanted to hide them.  She states she does not remember where she had them in the home and she has not been able to find them since.  She has also lost a rather expensive ring within the home.  She frequently forgets appointments and birthdays.  She reports she is still able to care for herself okay with her ADLs.   She has had headaches and dizziness over the last couple months and therefore had stopped  many of her medications thinking it was the cause.  She reports she has been taking her thyroid medicine as directed.  She denies any family history of any type of dementia that she is aware.  Prior note: Patient reports she is having some dizziness, fatigue and a strange paresthesia sensation located over her left anterior scalp for approximately 2 weeks.  She reports it will feel like there is pain and then it trickles down her face towards her temple. Please call patient: Kidney function is stable for her. Vitamin D levels are mildly low at 27> she reported taking 2000 units of vitamin D daily.  If she has been routinely taking 2000 units I would encourage her to increase to 3000 units.   Her B12 levels are extremely low at 243> she had reported taking 1000 mcg of B12 supplementation a day if she has been taking this routinely, we will need to set her up for B12 injections.  If she has not been taking her B12 routinely please have her restart immediately.  We will discuss in more detail at her appointment coming up. Blood cell counts are stable. Her urine was normal.   She also complains of her tongue burning.  She states it is not red, swollen or have thrush-like symptoms.  Food does not seem to be a trigger to  her burning.  Her lymphadenopathy of her submandibular glands are still present.  She has her mammogram repeat scheduled.  CT completed 05/10/2019 by another provider, per EMR review, showed new splenomegaly.  Patient also had new mild thrombocytopenia at her last lab collection.  Depression screen [facility] 2/9 12/29/2019 09/29/2018 01/25/2018 10/15/2017 03/13/2017  Decreased Interest 0 1 1 1  0  Down, Depressed, Hopeless 0 1 1 1  0  PHQ - 2 Score 0 2 2 2  0  Altered sleeping 3 3 1  - -  Tired, decreased energy 0 2 1 - -  Change in appetite 3 2 1  - -  Feeling bad or failure about yourself  0 1 1 - -  Trouble concentrating 3 0 0 - -  Moving slowly or fidgety/restless 0 0 0 - -  Suicidal thoughts 0  3 0 - -  PHQ-9 Score 9 13 6  - -  Difficult doing work/chores Not difficult at all Somewhat difficult Somewhat difficult - -   No flowsheet data found.     Fall Risk  09/29/2018 10/15/2017 03/13/2017 01/29/2017 05/25/2015  Falls in the past year? 0 No No No No     Immunization History  Administered Date(s) Administered  . Influenza Split 07/16/2011  . Influenza, High Dose Seasonal PF 10/15/2017, 09/03/2018  . Influenza,inj,Quad PF,6+ Mos 06/09/2014, 10/15/2015  . Moderna SARS-COVID-2 Vaccination 11/10/2019, 12/09/2019  . Pneumococcal Conjugate-13 03/13/2017  . Pneumococcal Polysaccharide-23 10/27/2014  . Tdap 07/16/2011    No exam data present  Past Medical History:  Diagnosis Date  . Asthma   . Bloating 04/26/2019  . 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 DIABETES MELLITUS, TYPE II 11/09/2007   diet control  . Diverticulosis 03/2011  . GERD (gastroesophageal reflux disease)   . GI bleed   . History of rheumatoid arthritis    during 30's, was treated.  . Hyperlipidemia   . Hypertension   . Osteopenia 2017   Last  bone density 05/04/2017: -2.4  . PONV (postoperative nausea and vomiting)   . Stress incontinence    Allergies  Allergen Reactions  . Codeine Phosphate Nausea And Vomiting and Rash   Past Surgical History:  Procedure Laterality Date  . ABDOMINAL HYSTERECTOMY    . CARDIAC CATHETERIZATION     X 2, last one in 1998  . CHOLECYSTECTOMY N/A 04/13/2017   Procedure: LAPAROSCOPIC CHOLECYSTECTOMY;  Surgeon: Aviva Signs, MD;  Location: [facility] ORS;  Service: General;  Laterality: N/A;  . COLONOSCOPY    . COLONOSCOPY  May 2012   Dr. Olevia Perches: mild diverticulosis, otherwise normal.   . ESOPHAGOGASTRODUODENOSCOPY N/A 01/28/2015   Dr. Gala Romney: reflux esophagitis, Schatzki's ring not manipulated due to recent bleeding  . ESOPHAGOGASTRODUODENOSCOPY N/A 03/30/2015   Dr. Gala Romney: Schatzki's ring s/p Venia Minks dilation, previously noted esophageal  ulcer completely healed  . MALONEY DILATION N/A 03/30/2015   Procedure: Venia Minks DILATION;  Surgeon: Daneil Dolin, MD;  Location: [facility] END[facility] SUITE;  Service: Endoscopy;  Laterality: N/A;   Family History  Problem Relation Age of Onset  . Heart disease Mother   . Osteoarthritis Mother   . Sudden death Father   . Single kidney Father   . Other Father        h/o severe MVA injuries  . Hyperlipidemia Sister   . Other Daughter        Myalgias  . Fibromyalgia Daughter   . Allergies Daughter   . Heart disease Maternal  Grandfather   . Sudden death Paternal Grandmother   . Diabetes Paternal Grandfather   . Heart disease Daughter   . Other Daughter        palpitations  . Pulmonary fibrosis Maternal Aunt   . Cancer Paternal Uncle   . Pulmonary fibrosis Maternal Aunt   . Colon cancer Neg Hx    Social History   Social History Narrative   Ms. Kohring is widowed. Her young grandson lives with her, for whom she shares custody with her daughter, the son's aunt.      Allergies as of 01/20/2020      Reactions   Codeine Phosphate Nausea And Vomiting, Rash      Medication List       Accurate as of Jan 20, 2020 11:59 PM. If you have any questions, ask your nurse or doctor.        acetaminophen 325 MG tablet Commonly known as: TYLENOL Take 650 mg by mouth every 6 (six) hours as needed for moderate pain.   albuterol 108 (90 Base) MCG/ACT inhaler Commonly known as: VENTOLIN HFA Inhale 2 puffs into the lungs every 6 (six) hours as needed for wheezing.   amLODipine 10 MG tablet Commonly known as: NORVASC Take 1 tablet (10 mg total) by mouth daily.   cholestyramine 4 g packet Commonly known as: Questran Take 1 packet (4 g total) by mouth daily for 7 days, THEN 1 packet (4 g total) 2 (two) times daily for 7 days, THEN 1 packet (4 g total) 3 (three) times daily. Start taking on: July 07, 2019   dicyclomine 10 MG capsule Commonly known as: BENTYL Take 1 capsule (10 mg total) by mouth 4  (four) times daily -  before meals and at bedtime. Needs appt for further refills.   levothyroxine 75 MCG tablet Commonly known as: SYNTHROID Take 1 tablet (75 mcg total) by mouth daily.   nystatin 100000 UNIT/ML suspension Commonly known as: MYCOSTATIN Take 5 mLs (500,000 Units total) by mouth 4 (four) times daily. 5 ml swish for 30 seconds and then spit out, QID, do not swallow. Started by: Howard Pouch, DO   pantoprazole 40 MG tablet Commonly known as: PROTONIX Take 1 tablet (40 mg total) by mouth daily before breakfast.   triamcinolone cream 0.1 % Commonly known as: KENALOG Apply 1 application topically 2 (two) times daily. For 2-4 weeks as needed   vitamin B-12 1000 MCG tablet Commonly known as: CYANOCOBALAMIN Take 1,000 mcg by mouth daily.   Vitamin D3 25 MCG (1000 UT) Caps Take 3,000 Units by mouth. What changed: Another medication with the same name was removed. Continue taking this medication, and follow the directions you see here. Changed by: Howard Pouch, DO       All past medical history, surgical history, allergies, family history, immunizations andmedications were updated in the EMR today and reviewed under the history and medication portions of their EMR.     ROS: 14 pt review of systems performed and negative (unless mentioned in an HPI)  Objective: BP 136/62 (BP Location: Left Arm, Patient Position: Sitting, Cuff Size: Normal)   Pulse 82   Temp 98 F (36.7 C) (Temporal)   Resp 16   Ht 5\' 2"  (1.575 m)   Wt 138 lb 6 oz (62.8 kg)   SpO2 99%   BMI 25.31 kg/m  Gen: Afebrile. No acute distress.  Nontoxic.  Pleasant female. HENT: AT. [place]. MMM.  Eyes:Pupils Equal Round Reactive to light, Extraocular movements intact,  Conjunctiva without redness, discharge or icterus. Neck/lymp/endocrine: Supple, bilateral submandibular lymphadenopathy present left greater than right.,  No thyromegaly CV: RRR no murmur, no edema Chest: CTAB, no wheeze or crackles Skin: no  rashes, purpura or petechiae.  Neuro: Normal gait. PERLA. EOMi. Alert. Oriented x3 Psych: Normal affect, dress and demeanor. Normal speech. Normal thought content and judgment..  Clock draw: Abnormal spacing noon to 3 PM and was unable to label correct time. 3 words: Recalled 1 of 3  No results found for this or any previous visit (from the past 24 hour(s)).  Assessment/plan: ETERNITY RABEY is a 79 y.o. female present for memory concerns Thrombocytopenia (HCC)/splenomegaly/lymphadenopathy New per CT 04/2019 and last labs a few months ago thrombocytopenia was very mild.  However patient has had rather significant cognitive decline over this time.   -She has an appointment scheduled for her repeat mammogram at the end of this month for left calcification noted. - US Soft Tissue Head/Neck (NON-THYROID); Future - Lactate Dehydrogenase (LDH) - Pathologist smear review - CBC w/Diff  Burning tongue: Possibly related to vitamin deficiencies.  She is now replating with vitamin D and B12.  No obvious cause on exam today. Will provide nystatin swish to be complete. -Patient reports compliance with the pantoprazole.  Cognitive deficits/Alzheimer's disease, unspecified (CODE) (HCC)/increased frequency of headaches Rather significant abrupt cognitive decline noticed by patient and her family over the last 4 months.  Suspect dementia, possible missed stroke, but cannot rule out tumor or mass given the abrupt onset of decline with lymphadenopathy, splenomegaly and calcification of left breast.  Discussed options with her today and she is agreeable for MRI evaluation of her brain. Cognitive tests are abnormal today. - Lactate Dehydrogenase (LDH) - Pathologist smear review - CBC w/Diff - Phosphorus - MR Brain Wo Contrast; Future -Discussed different medications to help slow down the process of dementia, and she would like to wait for now. -Continue B12 and vitamin D supplementations -Depending upon  MRI results referral to specialty team will be provided.   Follow-up dependent upon results. Orders Placed This Encounter  Procedures  . MR Brain Wo Contrast  . US Soft Tissue Head/Neck (NON-THYROID)  . Lactate Dehydrogenase (LDH)  . Pathologist smear review  . CBC w/Diff  . Phosphorus   Meds ordered this encounter  Medications  . nystatin (MYCOSTATIN) 100000 UNIT/ML suspension    Sig: Take 5 mLs (500,000 Units total) by mouth 4 (four) times daily. 5 ml swish for 30 seconds and then spit out, QID, do not swallow.    Dispense:  100 mL    Refill:  0   Referral Orders  No referral(s) requested today      Note is dictated utilizing voice recognition software. Although note has been proof read prior to signing, occasional typographical errors still can be missed. If any questions arise, please do not hesitate to call for verification.  Electronically signed by: Howard Pouch, DO [place]

## 2020-01-23 DIAGNOSIS — R4189 Other symptoms and signs involving cognitive functions and awareness: Secondary | ICD-10-CM | POA: Insufficient documentation

## 2020-01-23 DIAGNOSIS — K146 Glossodynia: Secondary | ICD-10-CM

## 2020-01-23 DIAGNOSIS — R591 Generalized enlarged lymph nodes: Secondary | ICD-10-CM

## 2020-01-23 HISTORY — DX: Generalized enlarged lymph nodes: R59.1

## 2020-01-23 HISTORY — DX: Glossodynia: K14.6

## 2020-01-23 LAB — CBC WITH DIFFERENTIAL/PLATELET
Absolute Monocytes: 530 cells/uL (ref 200–950)
Basophils Absolute: 78 cells/uL (ref 0–200)
Basophils Relative: 1 %
Eosinophils Absolute: 538 cells/uL — ABNORMAL HIGH (ref 15–500)
Eosinophils Relative: 6.9 %
HCT: 43.4 % (ref 35.0–45.0)
Hemoglobin: 14.3 g/dL (ref 11.7–15.5)
Lymphs Abs: 2590 cells/uL (ref 850–3900)
MCH: 26.6 pg — ABNORMAL LOW (ref 27.0–33.0)
MCHC: 32.9 g/dL (ref 32.0–36.0)
MCV: 80.8 fL (ref 80.0–100.0)
MPV: 11.2 fL (ref 7.5–12.5)
Monocytes Relative: 6.8 %
Neutro Abs: 4064 cells/uL (ref 1500–7800)
Neutrophils Relative %: 52.1 %
Platelets: 165 10*3/uL (ref 140–400)
RBC: 5.37 10*6/uL — ABNORMAL HIGH (ref 3.80–5.10)
RDW: 15 % (ref 11.0–15.0)
Total Lymphocyte: 33.2 %
WBC: 7.8 10*3/uL (ref 3.8–10.8)

## 2020-01-23 LAB — PATHOLOGIST SMEAR REVIEW

## 2020-01-23 LAB — LACTATE DEHYDROGENASE: LDH: 184 U/L (ref 120–250)

## 2020-01-23 LAB — PHOSPHORUS: Phosphorus: 3.7 mg/dL (ref 2.1–4.3)

## 2020-02-02 ENCOUNTER — Ambulatory Visit
Admission: RE | Admit: 2020-02-02 | Discharge: 2020-02-02 | Disposition: A | Discharge status: Home / Self Care | Payer: Medicare Other | Source: Ambulatory Visit | Attending: Family Medicine | Admitting: Family Medicine

## 2020-02-02 ENCOUNTER — Other Ambulatory Visit: Payer: Self-pay | Admitting: Family Medicine

## 2020-02-02 ENCOUNTER — Other Ambulatory Visit: Payer: Self-pay

## 2020-02-02 DIAGNOSIS — R921 Mammographic calcification found on diagnostic imaging of breast: Secondary | ICD-10-CM

## 2020-02-02 DIAGNOSIS — R2232 Localized swelling, mass and lump, left upper limb: Secondary | ICD-10-CM

## 2020-02-02 DIAGNOSIS — R599 Enlarged lymph nodes, unspecified: Secondary | ICD-10-CM

## 2020-02-02 DIAGNOSIS — N6320 Unspecified lump in the left breast, unspecified quadrant: Secondary | ICD-10-CM | POA: Diagnosis not present

## 2020-02-02 IMAGING — US US AXILLARY LEFT
1 series · 13 of 15 positions shown · non-contrast
Comparison: Previous exam(s).

CLINICAL DATA: 78-year-old female presenting with a new lump in the
left axilla. She is also being followed for probably benign
calcifications in the left breast.

EXAM:
DIGITAL DIAGNOSTIC LEFT MAMMOGRAM WITH TOMO
ULTRASOUND LEFT AXILLA

[Series 1: us axillary left · 0.08mm/px · 13 of 15 slices shown]
[im 1/15]
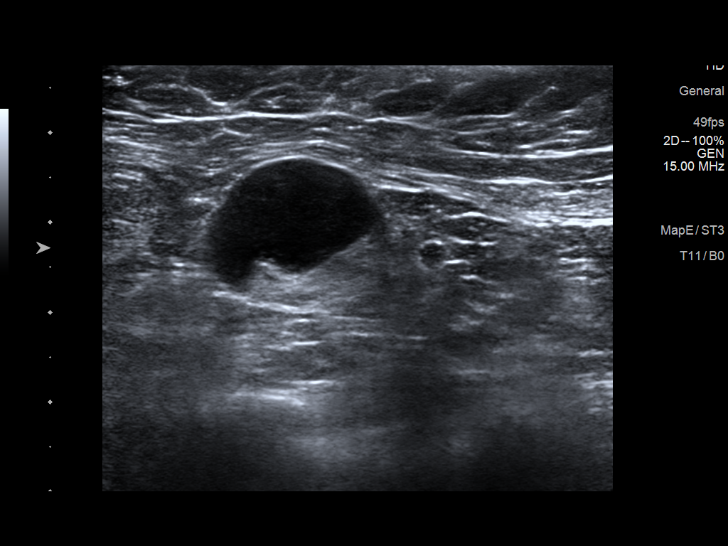
[im 2/15]
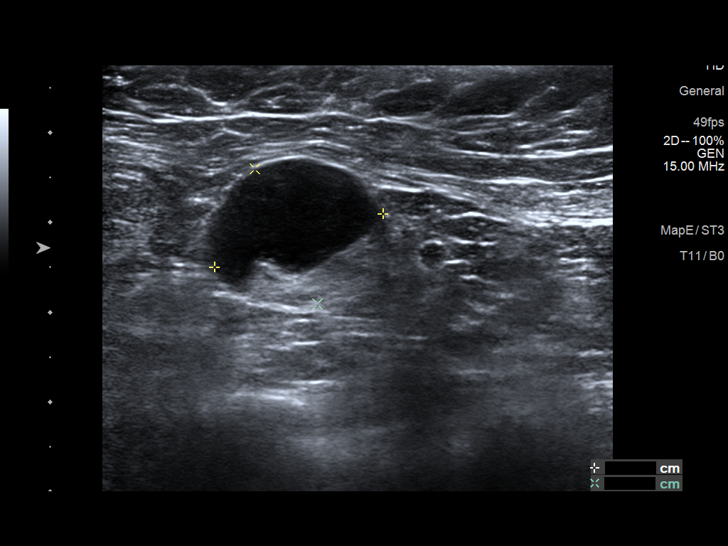
[im 3/15]
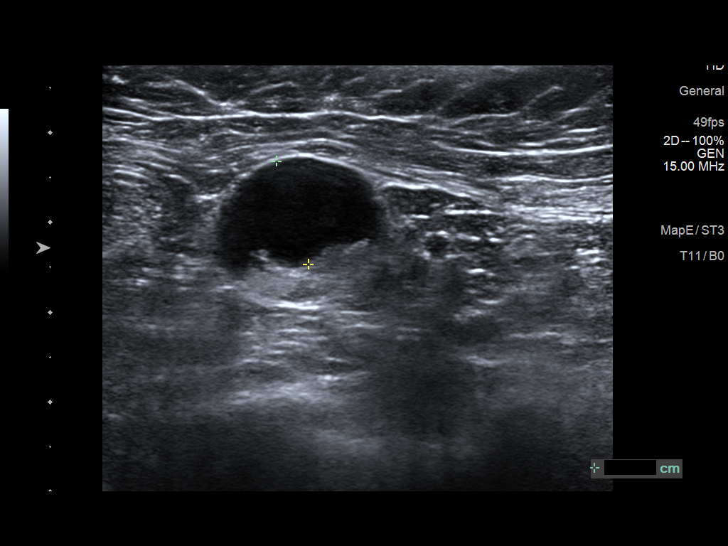
[im 5/15]
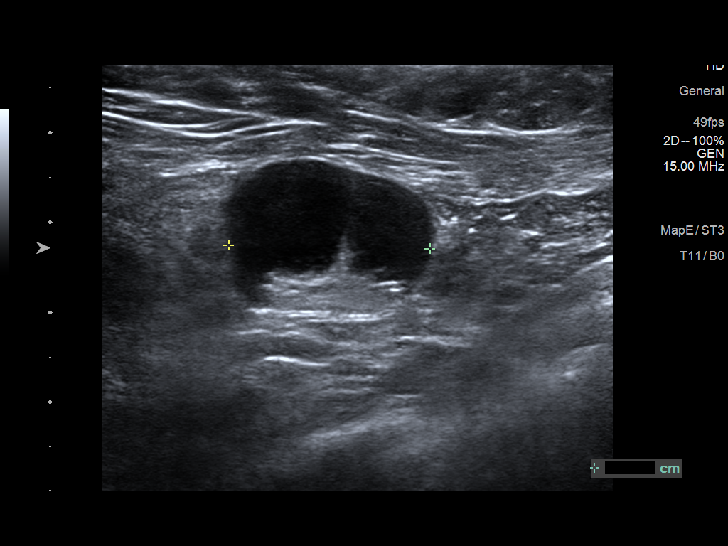
[im 6/15]
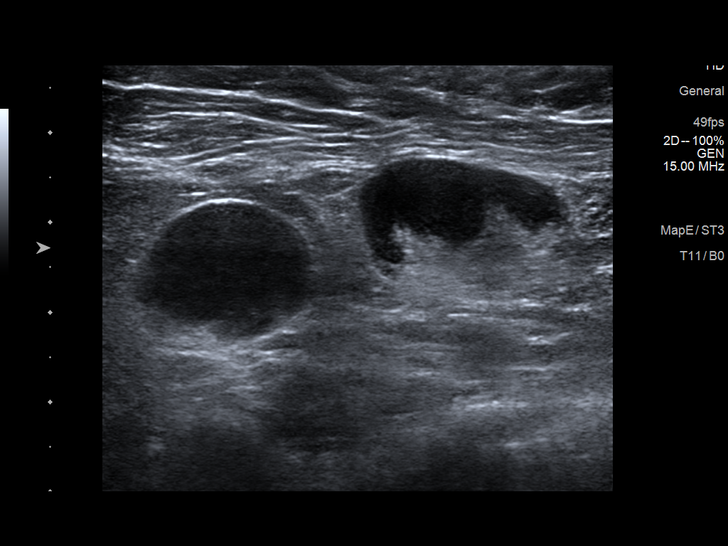
[im 7/15]
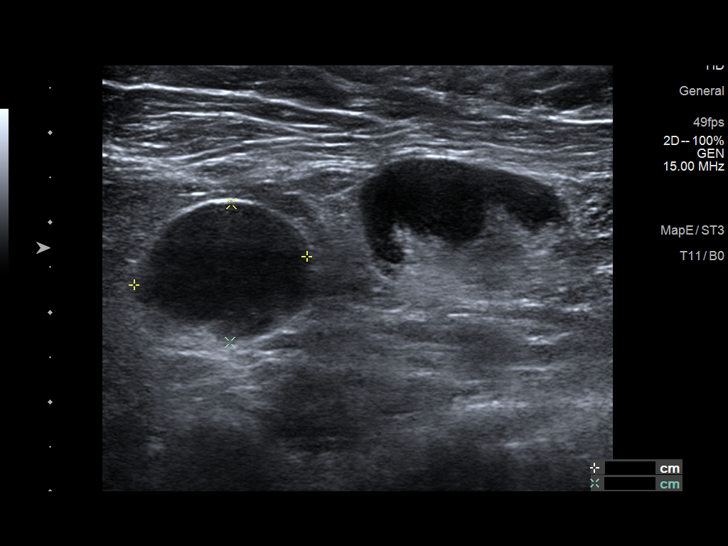
[im 8/15]
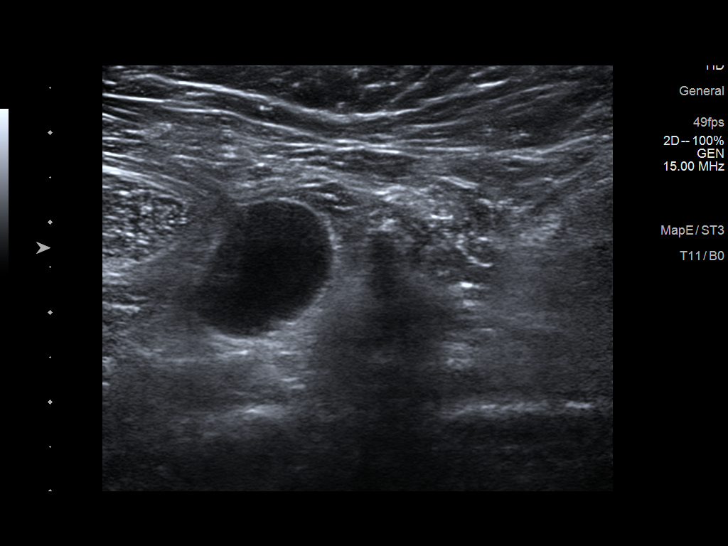
[im 9/15]
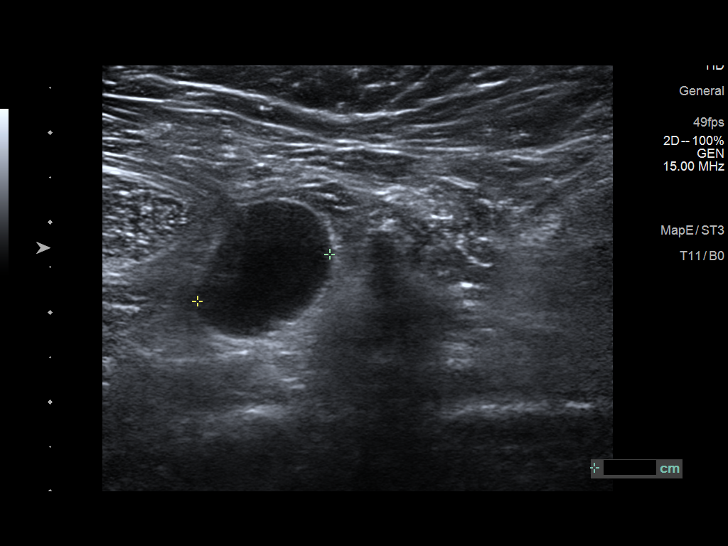
[im 10/15]
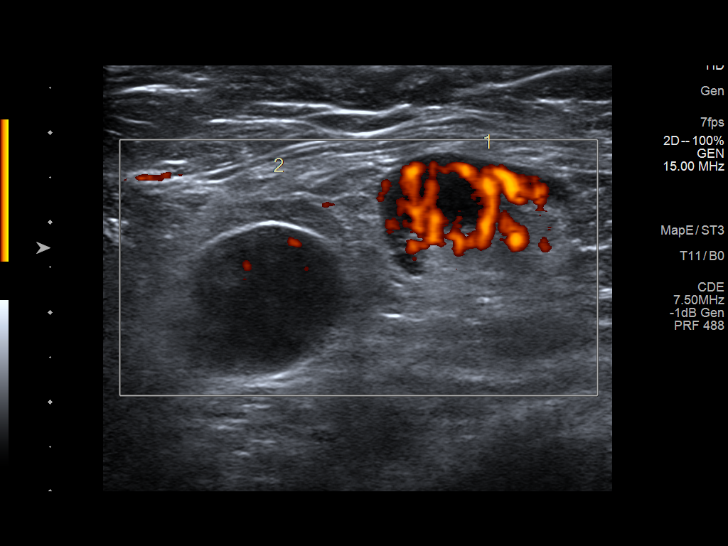
[im 11/15]
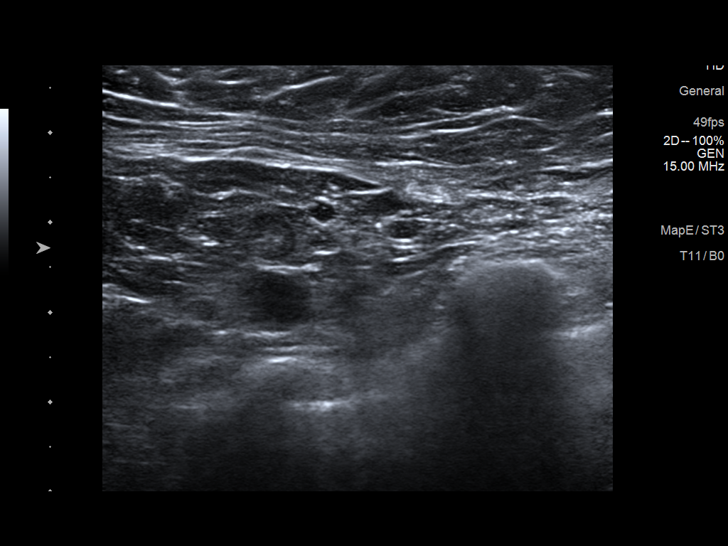
[im 13/15]
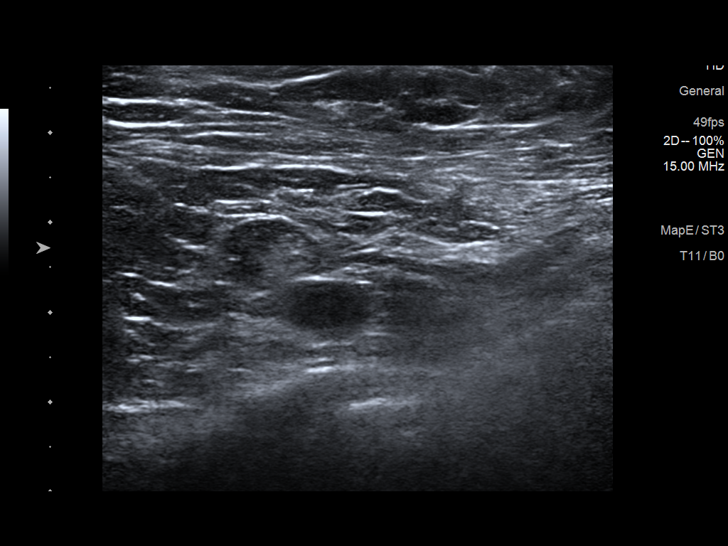
[im 14/15]
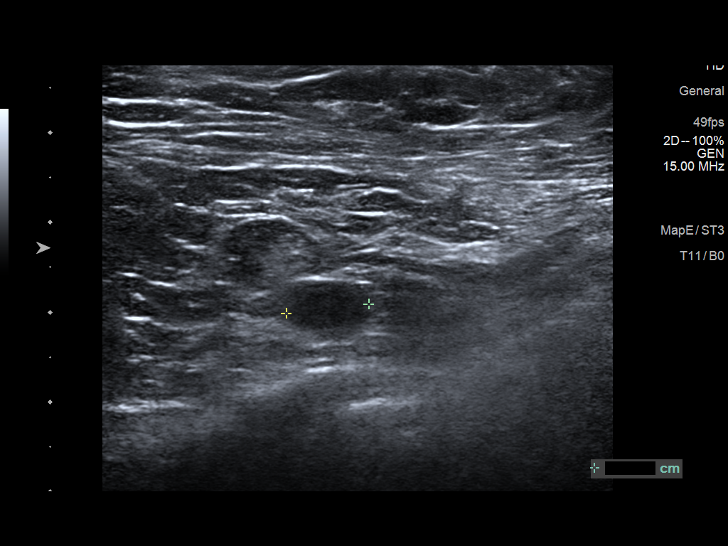
[im 15/15]
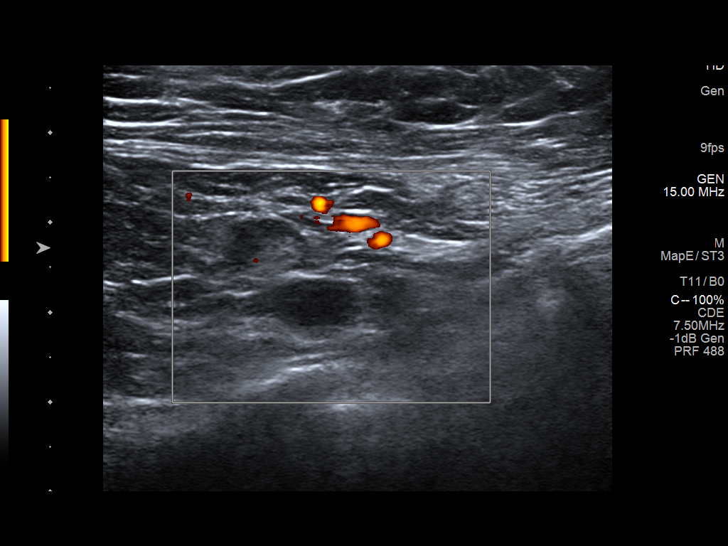

[13 of 15 positions shown; findings below may reference images not displayed]

ACR Breast Density Category b: There are scattered areas of
fibroglandular density.
FINDINGS: Mammogram:

Spot 2D magnification and spot compression tomosynthesis views of
the left breast were performed. There grouped coarse heterogeneous
calcifications in the upper outer left breast measuring 4 mm which
have a slightly coarsened compared to the prior mammogram, but
otherwise not significantly changed. There is no new suspicious
finding in the left breast.

In the left axilla there are two enlarged lymph nodes measuring up
to 3.3 cm at the palpable site of concern.

On physical exam, I palpate a discrete mobile mass at the site of
concern in the low left axilla.

Ultrasound:

Targeted ultrasound is performed in the left axilla demonstrating 3
abnormal enlarged lymph nodes with cortical thickening. The largest
of these measures 2.2 x 1.7 x 2.0 cm.
IMPRESSION: 1. Stable probably benign calcifications in the upper outer left
breast spanning 4 mm.

2. Abnormal enlarged left axillary lymph nodes (3) which are
suspicious for lymphoma or metastatic disease.

RECOMMENDATION:
1. Ultrasound-guided core needle biopsy of a left axillary lymph
node.

2. Diagnostic bilateral mammogram in 1 year to confirm stability of
the probably benign left breast calcifications and for annual exam
of the right breast.

I have discussed the findings and recommendations with the patient.
If applicable, a reminder letter will be sent to the patient
regarding the next appointment.

BI-RADS CATEGORY  4: Suspicious.

## 2020-02-02 IMAGING — MG DIGITAL DIAGNOSTIC BILAT W/ TOMO W/ CAD
8 of 12 series · 8 of 32 positions shown · non-contrast
Comparison: Previous exam(s).

CLINICAL DATA: 78-year-old female presenting with a new lump in the
left axilla. She is also being followed for probably benign
calcifications in the left breast.

EXAM:
DIGITAL DIAGNOSTIC LEFT MAMMOGRAM WITH TOMO
ULTRASOUND LEFT AXILLA

[L CC]
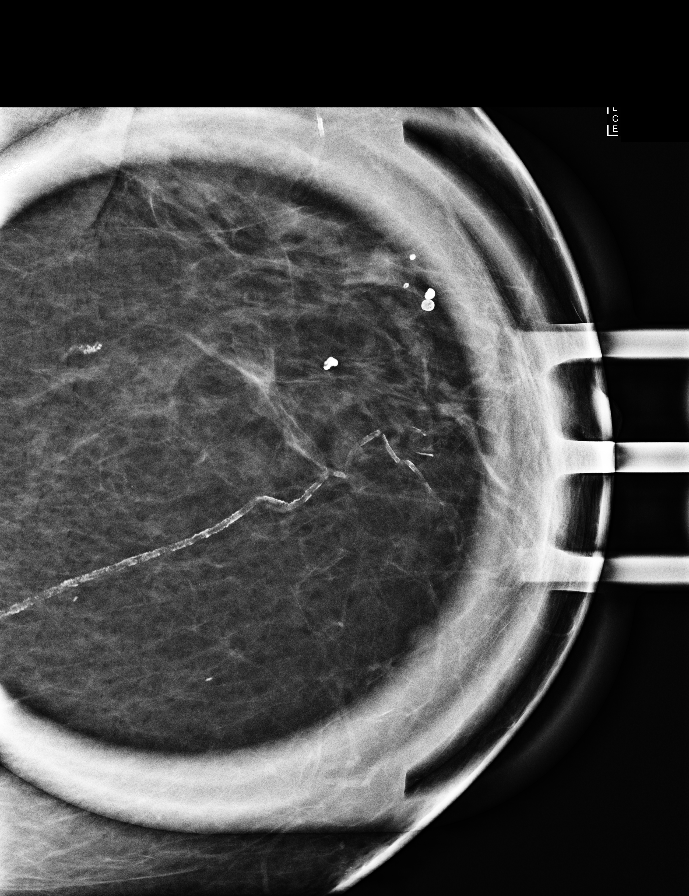

[L ML]
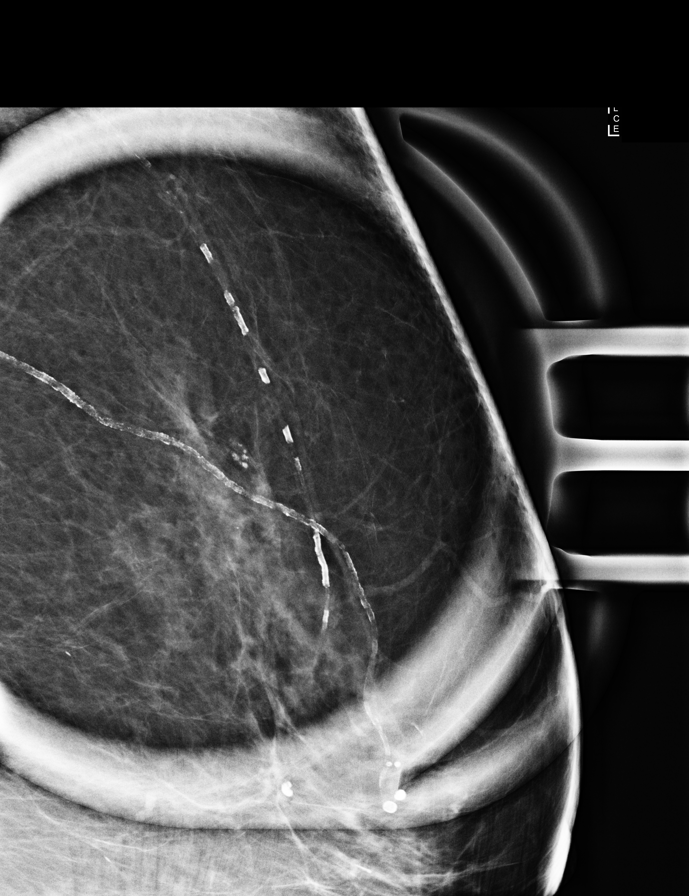

[R CC synth-2D]
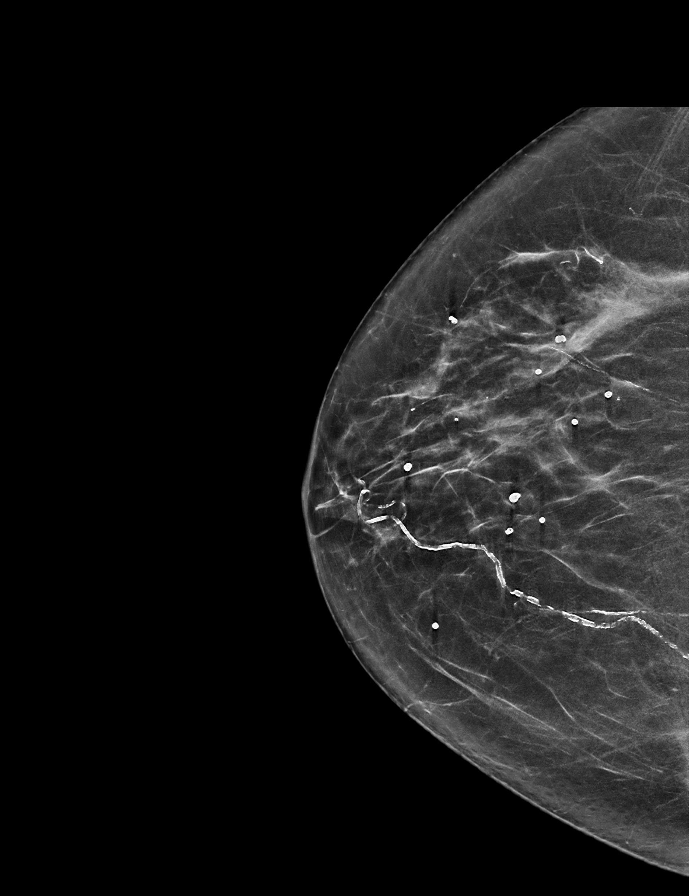

[L TAN synth-2D]
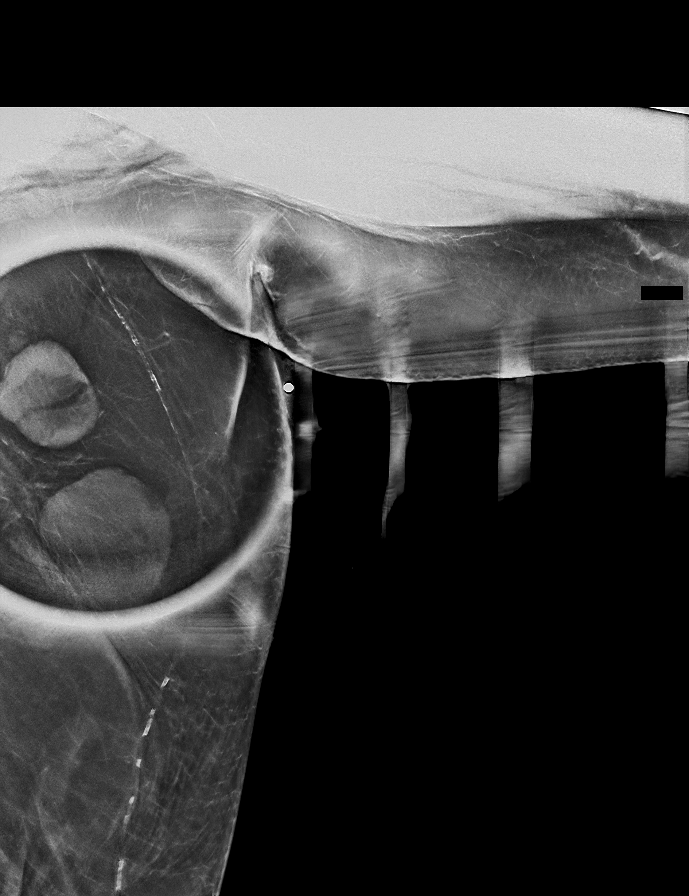

[L MLO synth-2D]
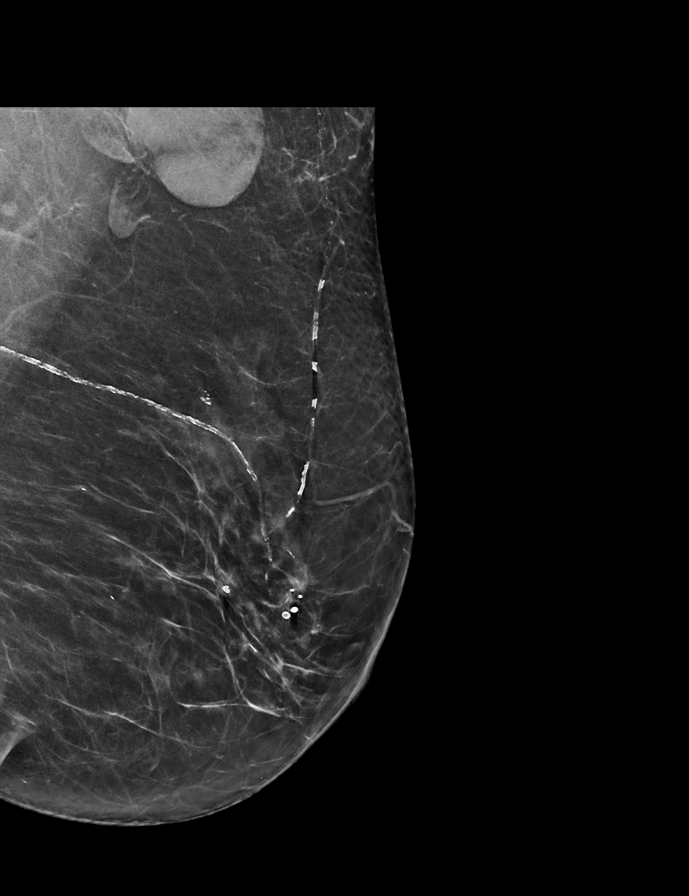

[L CC synth-2D]
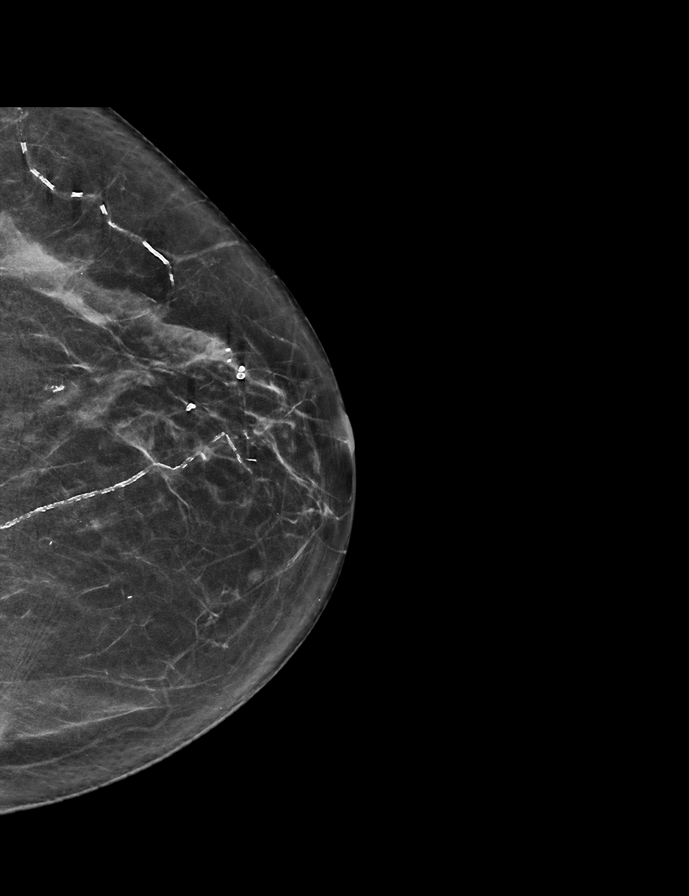

[R MLO synth-2D]
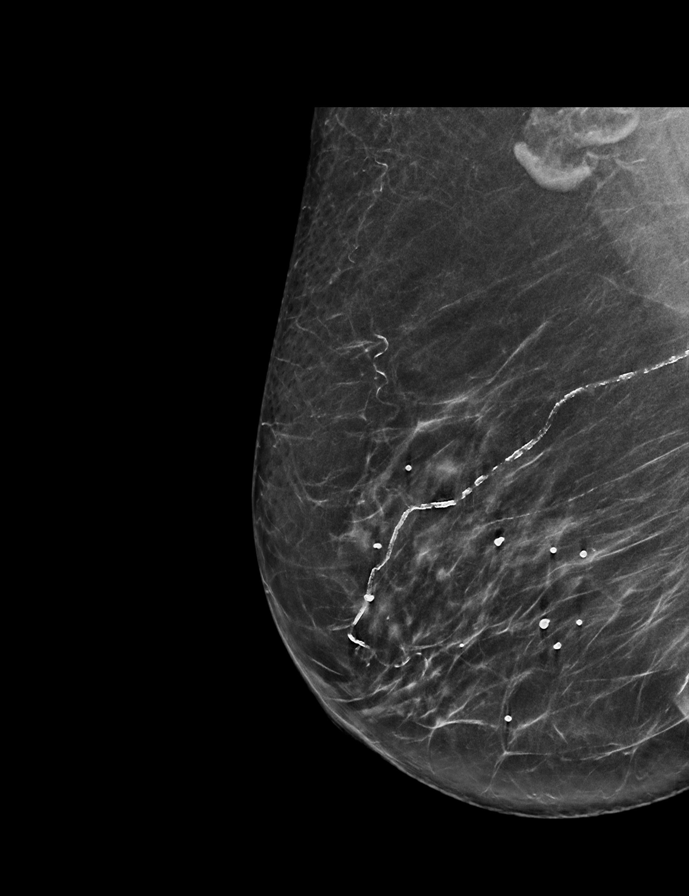

[L CC tomo · tomo slice 29/58.0]
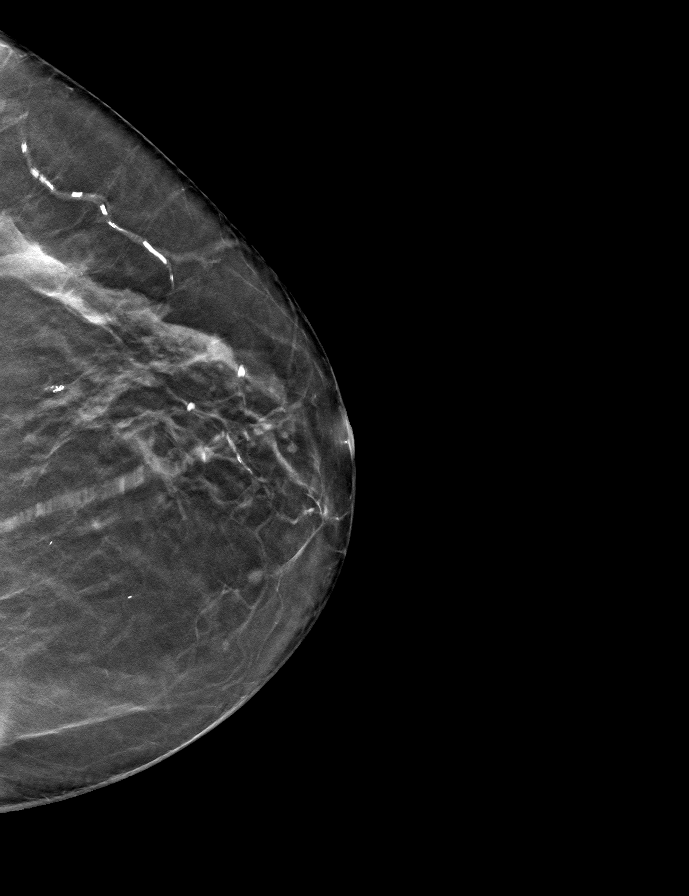

[8 of 32 positions shown; findings below may reference images not displayed]

ACR Breast Density Category b: There are scattered areas of
fibroglandular density.
FINDINGS: Mammogram:

Spot 2D magnification and spot compression tomosynthesis views of
the left breast were performed. There grouped coarse heterogeneous
calcifications in the upper outer left breast measuring 4 mm which
have a slightly coarsened compared to the prior mammogram, but
otherwise not significantly changed. There is no new suspicious
finding in the left breast.

In the left axilla there are two enlarged lymph nodes measuring up
to 3.3 cm at the palpable site of concern.

On physical exam, I palpate a discrete mobile mass at the site of
concern in the low left axilla.

Ultrasound:

Targeted ultrasound is performed in the left axilla demonstrating 3
abnormal enlarged lymph nodes with cortical thickening. The largest
of these measures 2.2 x 1.7 x 2.0 cm.
IMPRESSION: 1. Stable probably benign calcifications in the upper outer left
breast spanning 4 mm.

2. Abnormal enlarged left axillary lymph nodes (3) which are
suspicious for lymphoma or metastatic disease.

RECOMMENDATION:
1. Ultrasound-guided core needle biopsy of a left axillary lymph
node.

2. Diagnostic bilateral mammogram in 1 year to confirm stability of
the probably benign left breast calcifications and for annual exam
of the right breast.

I have discussed the findings and recommendations with the patient.
If applicable, a reminder letter will be sent to the patient
regarding the next appointment.

BI-RADS CATEGORY  4: Suspicious.

## 2020-02-07 ENCOUNTER — Ambulatory Visit
Admission: RE | Admit: 2020-02-07 | Discharge: 2020-02-07 | Disposition: A | Discharge status: Home / Self Care | Payer: Medicare Other | Source: Ambulatory Visit | Attending: Family Medicine | Admitting: Family Medicine

## 2020-02-07 ENCOUNTER — Other Ambulatory Visit: Payer: Self-pay

## 2020-02-07 DIAGNOSIS — R599 Enlarged lymph nodes, unspecified: Secondary | ICD-10-CM

## 2020-02-07 DIAGNOSIS — R590 Localized enlarged lymph nodes: Secondary | ICD-10-CM | POA: Diagnosis not present

## 2020-02-07 IMAGING — US US AXILLARY NODE CORE BIOPSY LEFT
1 series · 12 of 12 positions shown · non-contrast
Comparison: Previous exam(s).
COMPARISON: Previous exam(s).

Addendum:
CLINICAL DATA: 78-year-old female with left axillary
lymphadenopathy.

EXAM:
US AXILLARY NODE CORE BIOPSY LEFT

[Series 1: us axillary node core biopsy left · 0.07mm/px · 12 of 12 slices shown]
[im 1/12]
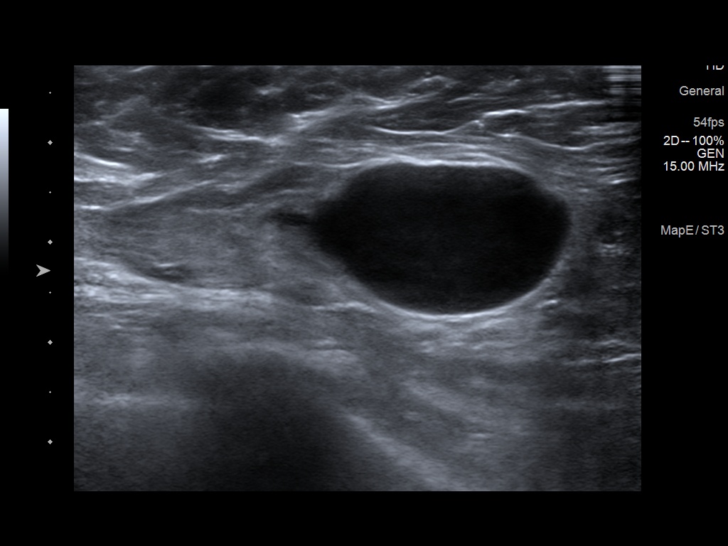
[im 2/12]
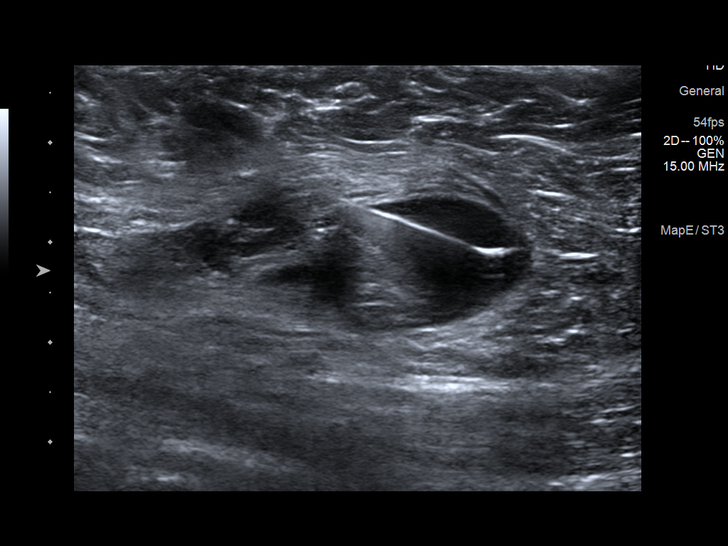
[im 3/12]
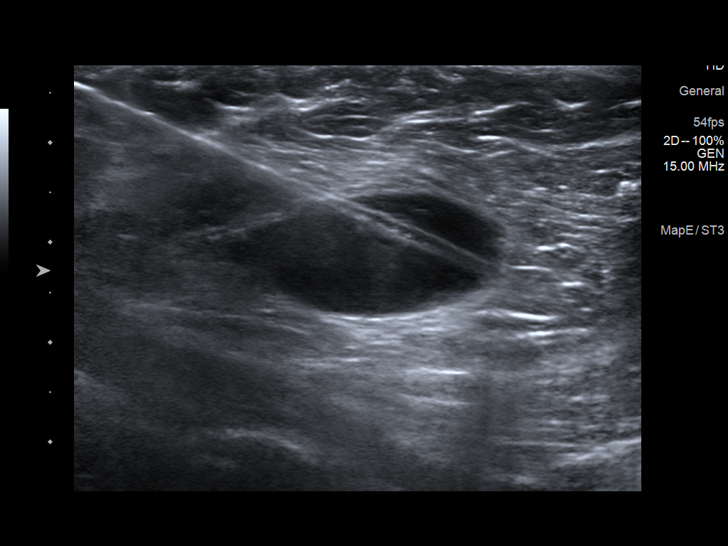
[im 4/12]
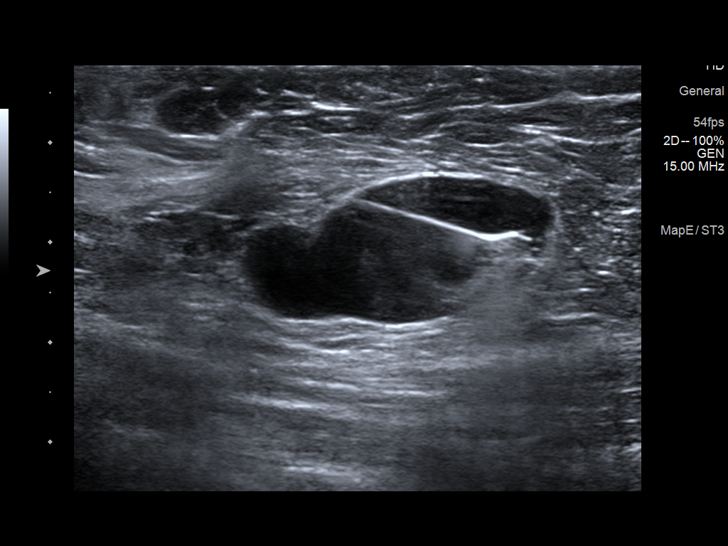
[im 5/12]
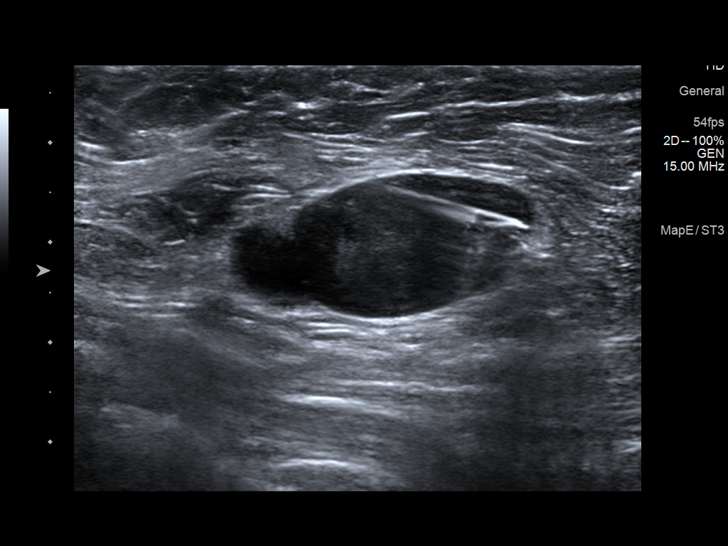
[im 6/12]
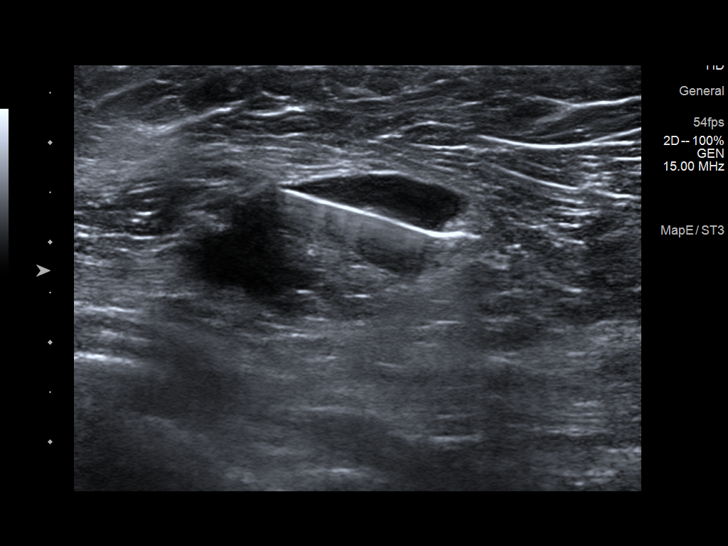
[im 7/12]
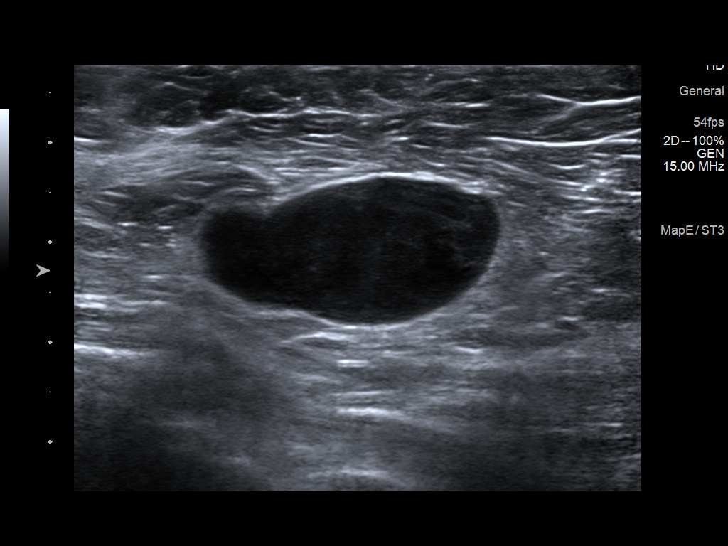
[im 8/12]
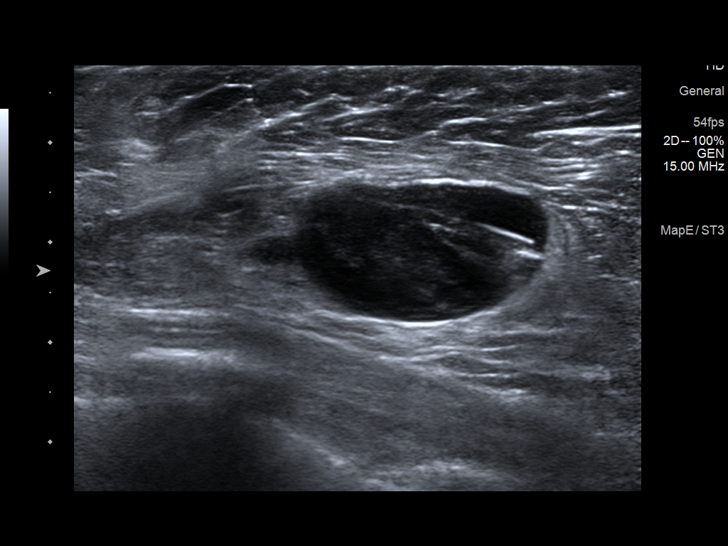
[im 9/12]
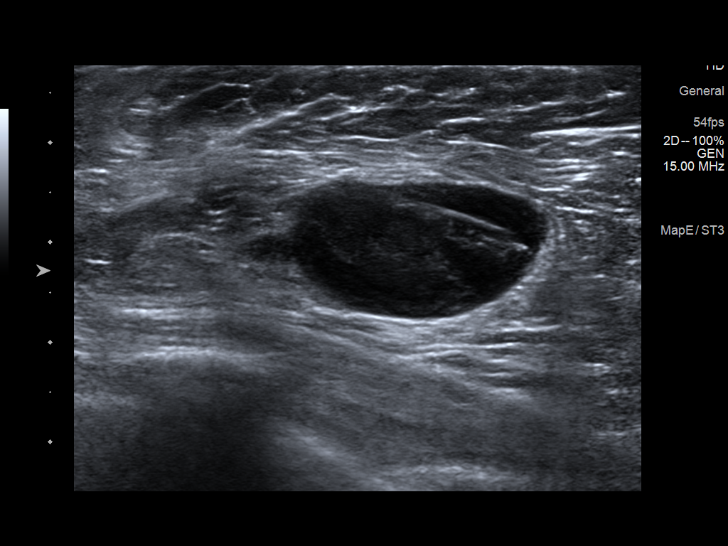
[im 10/12]
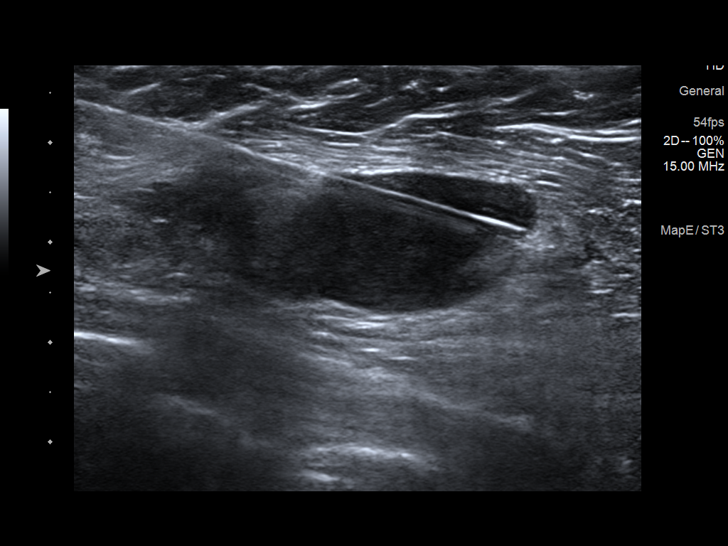
[im 11/12]
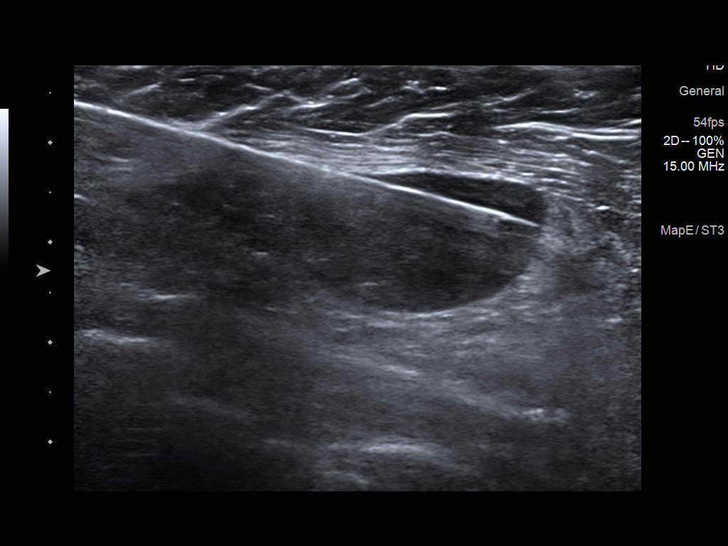
[im 12/12]
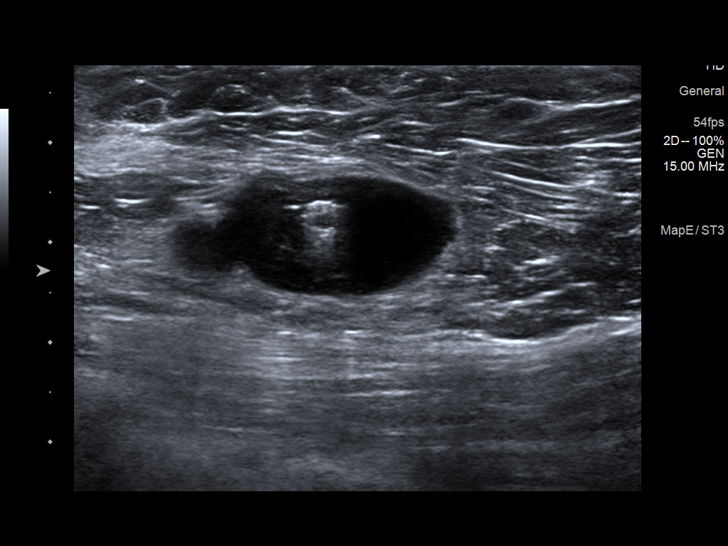

[12 of 12 positions shown; findings below may reference images not displayed]



Using sterile technique and 1% Lidocaine as local anesthetic, under
direct ultrasound visualization, a 14 gauge DON LOLITO device was
used to perform biopsy of a morphologically abnormal left axillary
lymph node using a inferior approach. The sample was sent in both
formalin and saline. At the conclusion of the procedure Q tissue
marker clip was deployed into the biopsy cavity. Follow up 2 view
mammogram was performed and dictated separately.
IMPRESSION: Ultrasound guided biopsy of a left axillary lymph node. No apparent
complications.

ADDENDUM:
Pathology revealed ATYPICAL LYMPHOID PROLIFERATION HIGHLY SUSPICIOUS
FOR NON-HODGKIN B CELL LYMPHOMA of the Left axilla. Flow Pathology
revealed B-cell population with lambda light chain excess. The
overall features are atypical and highly suspicious for non-Hodgkin
B-cell lymphoma, particularly marginal zone lymphoma. An excisional
biopsy is recommended for further evaluation. This was found to be
concordant by Dr. DON LOLITO, with excisional biopsy recommended.

Pathology results were discussed with the patient by telephone, by
Dr. DON LOLITO at [REDACTED], [HOSPITAL] location, on
[DATE]. I spoke with the patient on [DATE] to inform her
the biopsy results were not ready, she reported doing well after the
biopsy with tenderness at the site. Post biopsy instructions and
care were reviewed and questions were answered. The patient was
encouraged to call The [REDACTED] for any
additional concerns.

Dr. DON LOLITO arrange general surgery and medical oncology
referrals for the patient.

Pathology results reported by DON LOLITO, RN on [DATE].



Using sterile technique and 1% Lidocaine as local anesthetic, under
direct ultrasound visualization, a 14 gauge DON LOLITO device was
used to perform biopsy of a morphologically abnormal left axillary
lymph node using a inferior approach. The sample was sent in both
formalin and saline. At the conclusion of the procedure Q tissue
marker clip was deployed into the biopsy cavity. Follow up 2 view
mammogram was performed and dictated separately.
IMPRESSION: Ultrasound guided biopsy of a left axillary lymph node. No apparent
complications.

## 2020-02-08 ENCOUNTER — Ambulatory Visit (HOSPITAL_COMMUNITY)
Admission: RE | Admit: 2020-02-08 | Discharge: 2020-02-08 | Disposition: A | Discharge status: Home / Self Care | Payer: Medicare Other | Source: Ambulatory Visit | Attending: Family Medicine | Admitting: Family Medicine

## 2020-02-08 DIAGNOSIS — G309 Alzheimer's disease, unspecified: Secondary | ICD-10-CM | POA: Insufficient documentation

## 2020-02-08 DIAGNOSIS — R221 Localized swelling, mass and lump, neck: Secondary | ICD-10-CM | POA: Diagnosis not present

## 2020-02-08 DIAGNOSIS — R591 Generalized enlarged lymph nodes: Secondary | ICD-10-CM

## 2020-02-08 DIAGNOSIS — R4189 Other symptoms and signs involving cognitive functions and awareness: Secondary | ICD-10-CM | POA: Diagnosis not present

## 2020-02-08 DIAGNOSIS — R519 Headache, unspecified: Secondary | ICD-10-CM | POA: Insufficient documentation

## 2020-02-08 IMAGING — US US SOFT TISSUE HEAD/NECK
1 series · 14 of 15 positions shown · non-contrast
Comparison: None.

CLINICAL DATA: Chronic sore throat. Left submandibular lump x6
months.

EXAM:
ULTRASOUND OF HEAD/NECK SOFT TISSUES
TECHNIQUE: Ultrasound examination of the head and neck soft tissues was
performed in the area of clinical concern.

[Series 1: us soft tissue head/neck · 14 of 15 slices shown]
[im 1/15]
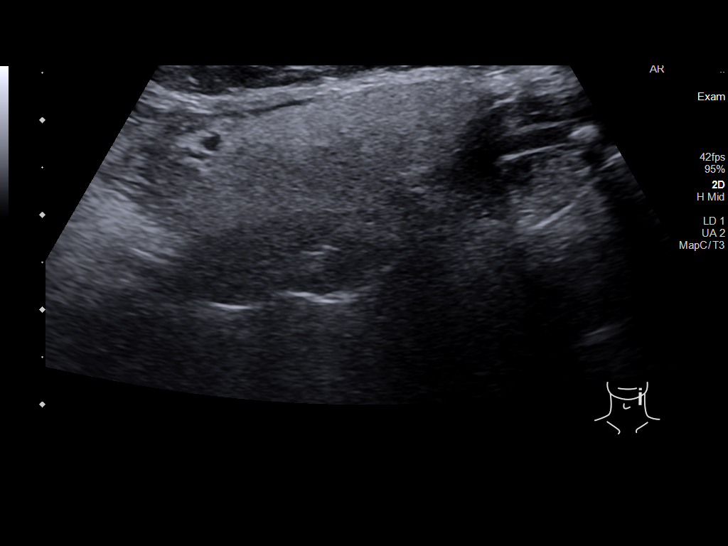
[im 2/15]
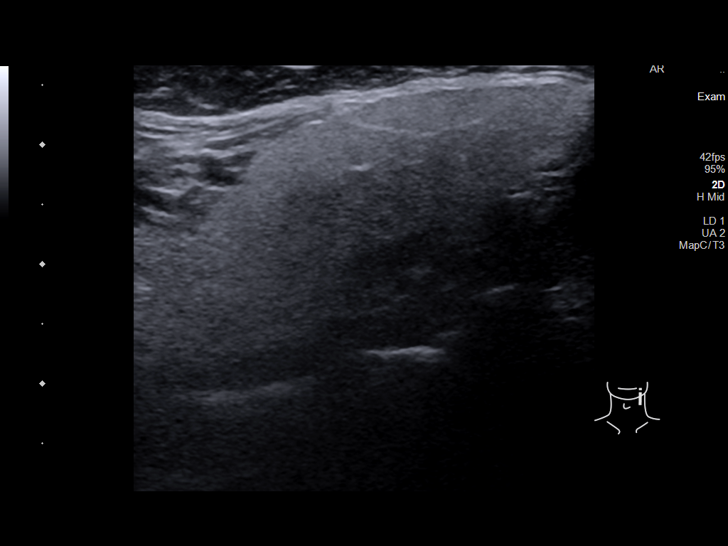
[im 3/15]
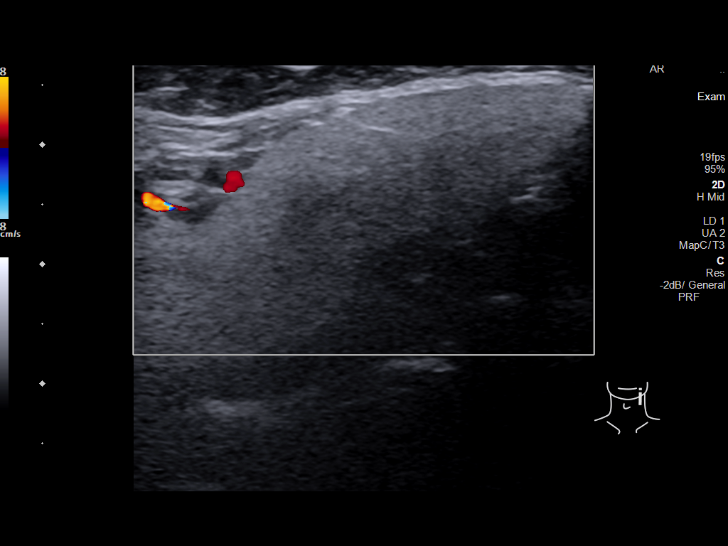
[im 4/15]
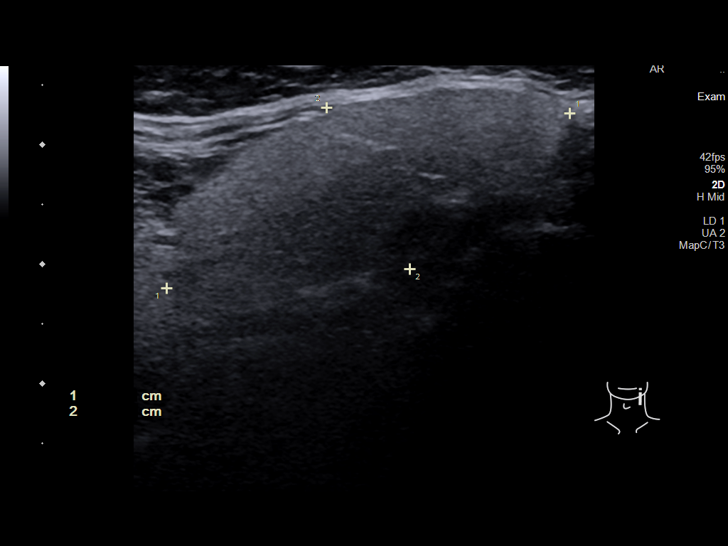
[im 5/15]
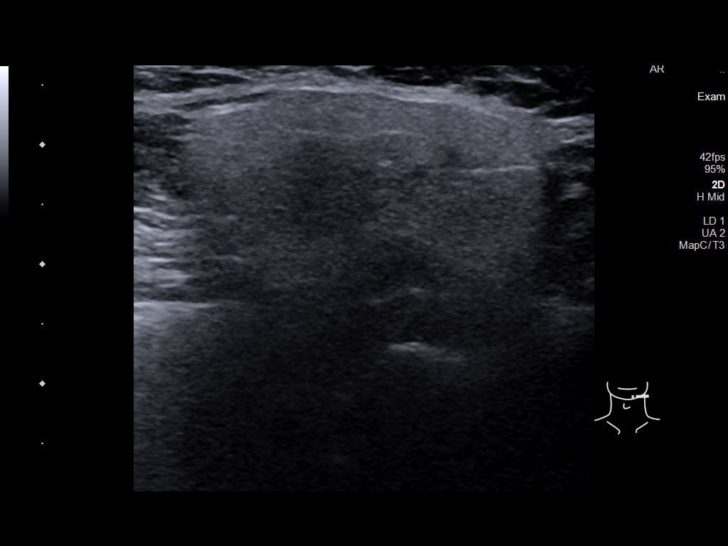
[im 6/15]
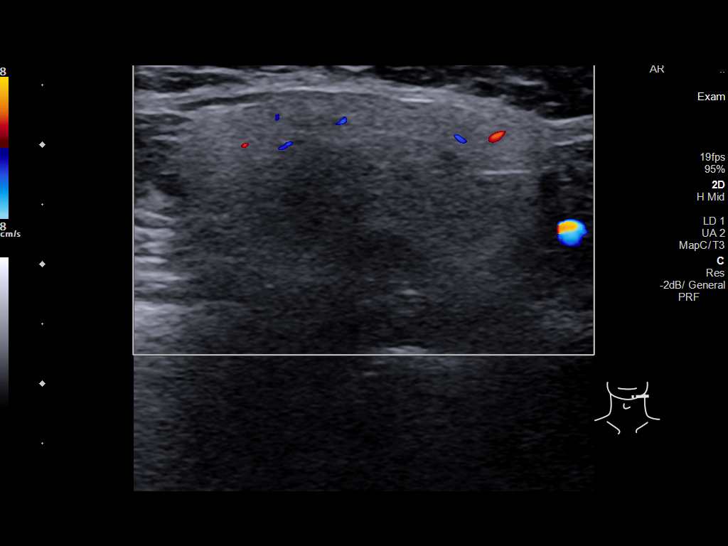
[im 7/15]
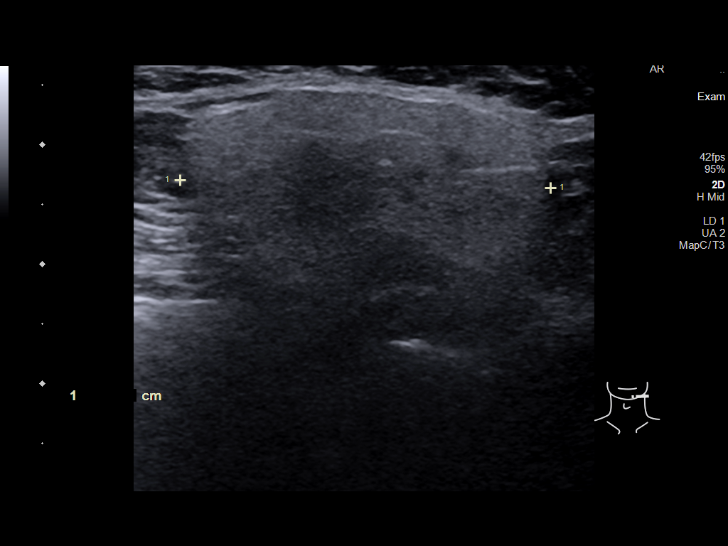
[im 9/15]
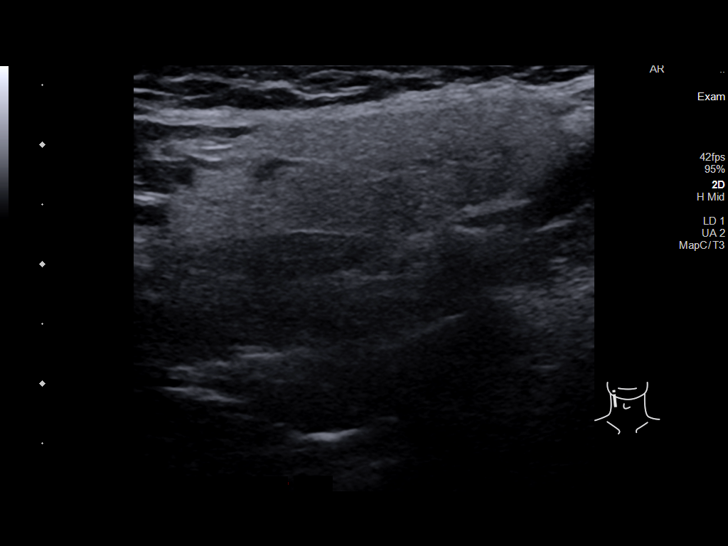
[im 10/15]
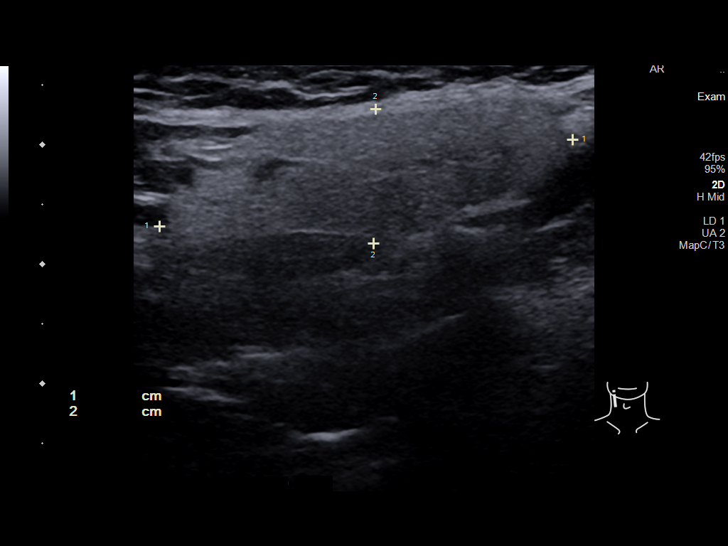
[im 11/15]
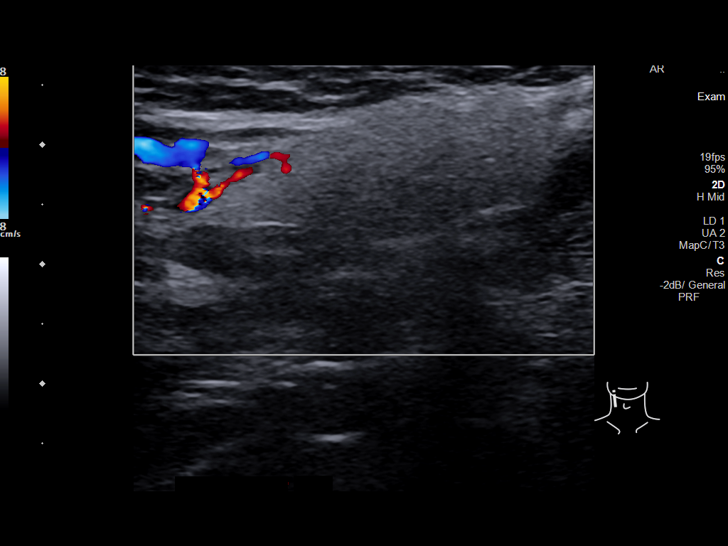
[im 12/15]
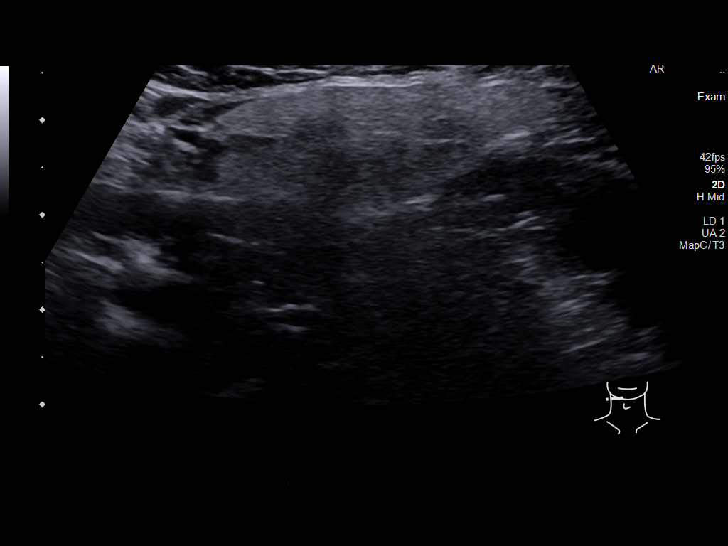
[im 13/15]
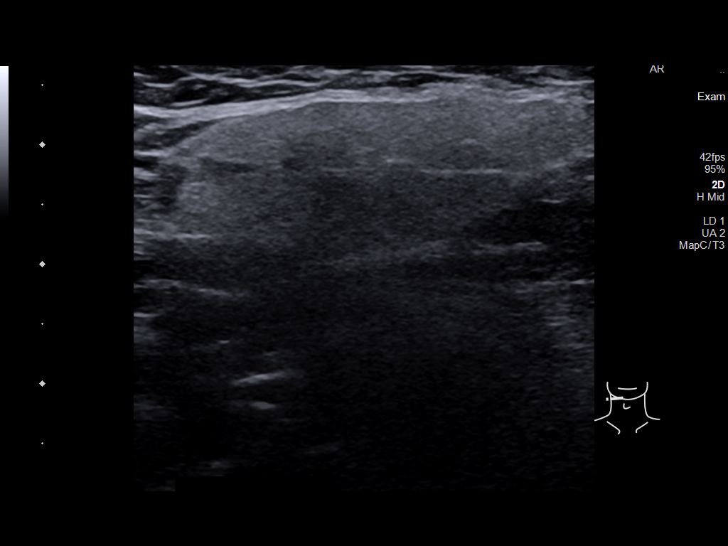
[im 14/15]
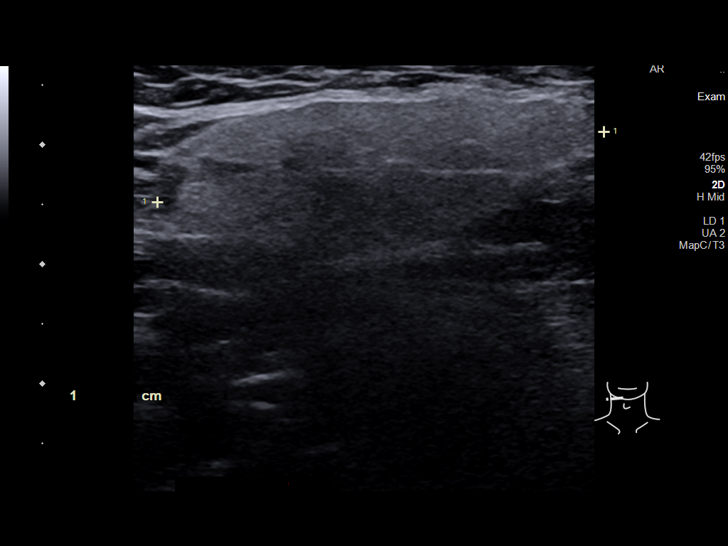
[im 15/15]
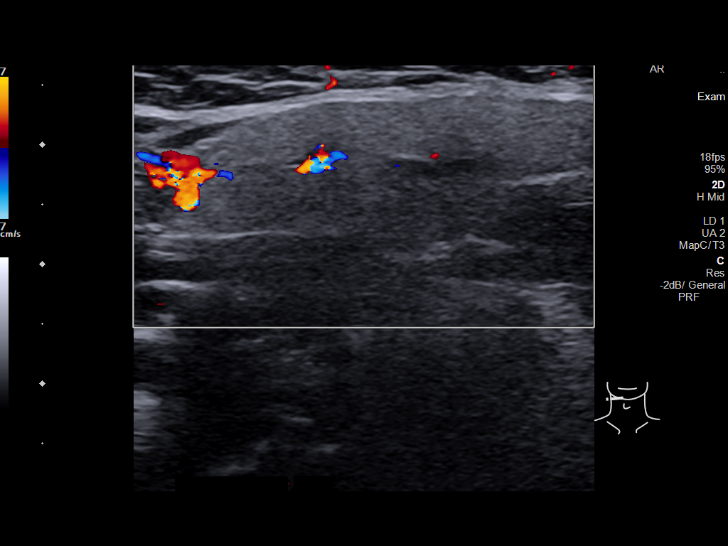

[14 of 15 positions shown; findings below may reference images not displayed]

FINDINGS: The visualized portions of the left submandibular gland measure
approximately 3.7 x 1.5 x 3.1 cm. The gland does not appear to be
hypervascular. The right-sided submandibular gland measures 3.5 x
1.1 x 3.8 cm.
IMPRESSION: No significant sonographic abnormality detected. If symptoms
persist, consider follow-up with contrast enhanced cross-sectional
imaging (CT or MRI).

## 2020-02-08 IMAGING — MR MR HEAD W/O CM
7 of 10 series · 29 of 48 positions shown · non-contrast
Comparison: None.

CLINICAL DATA: Cognitive deficits. Dementia, Alzheimer's suspected.
Increased frequency of headaches.

EXAM:
MRI HEAD WITHOUT CONTRAST
TECHNIQUE: Multiplanar, multiecho pulse sequences of the brain and surrounding
structures were obtained without intravenous contrast.

[Series 2: DWI · axial · 3.0mm · 0.79mm/px · z∈[-73,+76]mm · 8 of 102 slices shown (1 of 2)]
[im 1/102]
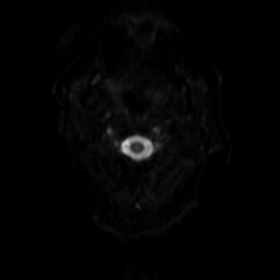
[im 12/102]
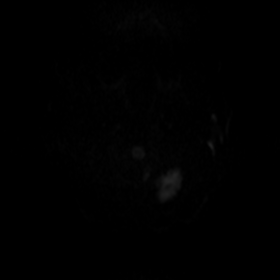
[im 34/102]
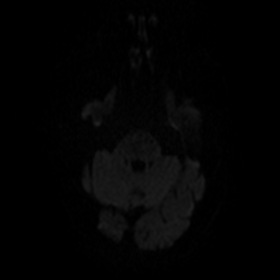
[im 45/102]
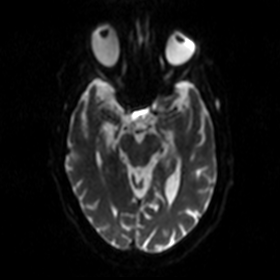
[im 57/102]
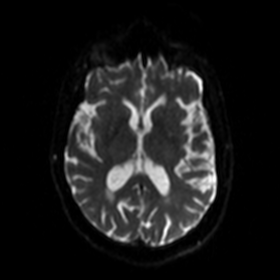
[im 68/102]
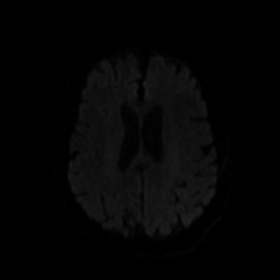
[im 90/102]
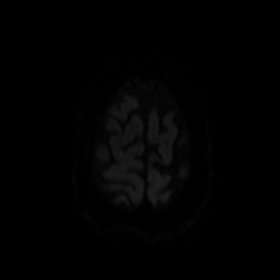
[im 102/102]
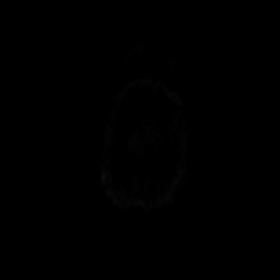

[Series 4: T1 · sagittal · 5.0mm · 0.43mm/px · 1 of 21 slices shown]
[im 1/21]
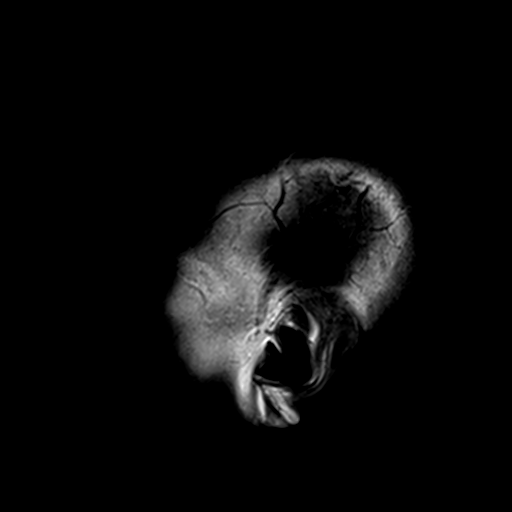

[Series 5: DWI · coronal · 5.0mm · 0.55mm/px · 7 of 72 slices shown (2 of 2)]
[im 1/72]
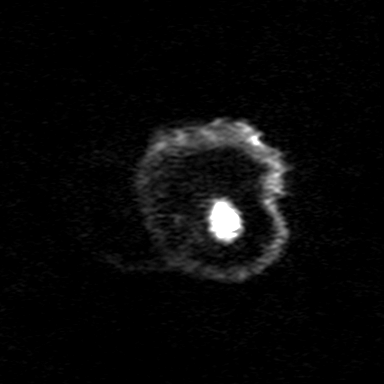
[im 12/72]
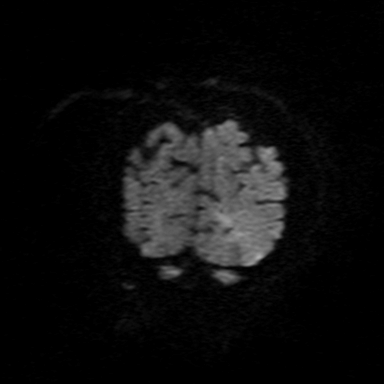
[im 24/72]
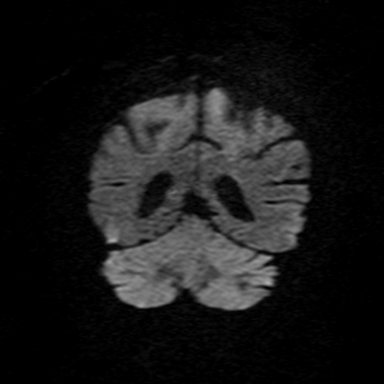
[im 36/72]
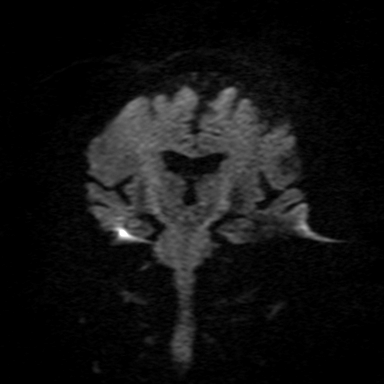
[im 48/72]
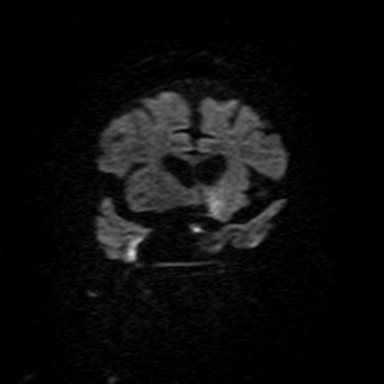
[im 60/72]
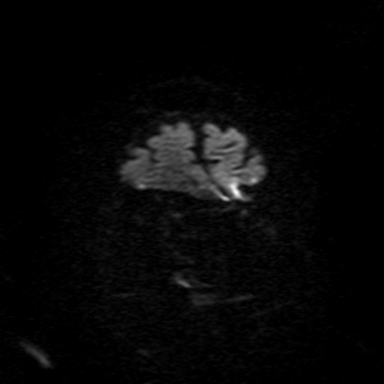
[im 72/72]
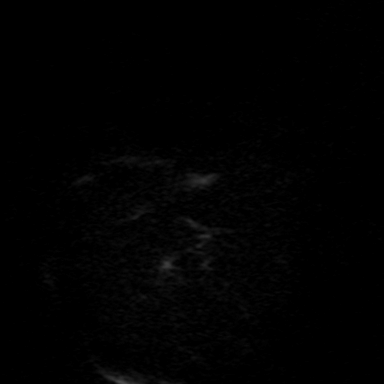

[Series 7: T2 · axial · 5.0mm · 0.48mm/px · z∈[-53,+87]mm · 2 of 23 slices shown (1 of 3)]
[im 1/23]
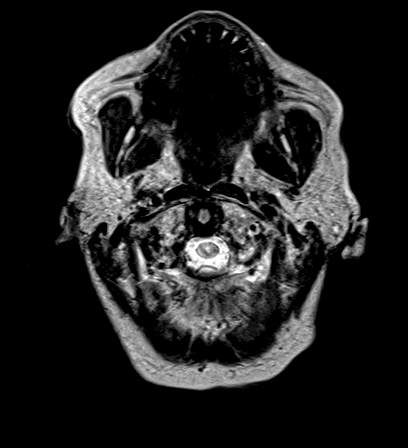
[im 23/23]
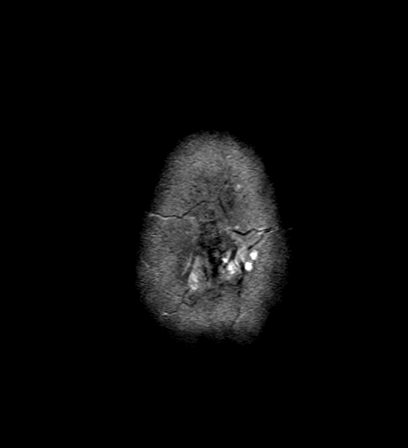

[Series 8: T2 · axial · 4.0mm · 0.42mm/px · z∈[-55,+87]mm · 3 of 30 slices shown (2 of 3)]
[im 1/30]
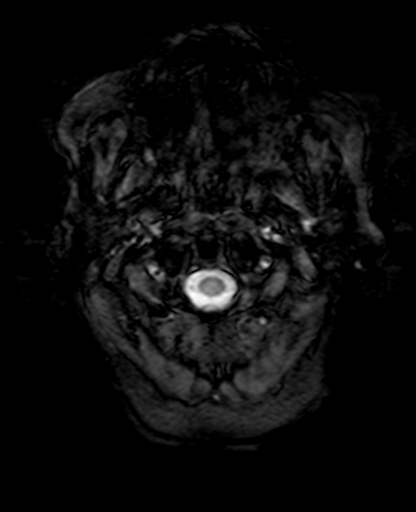
[im 15/30]
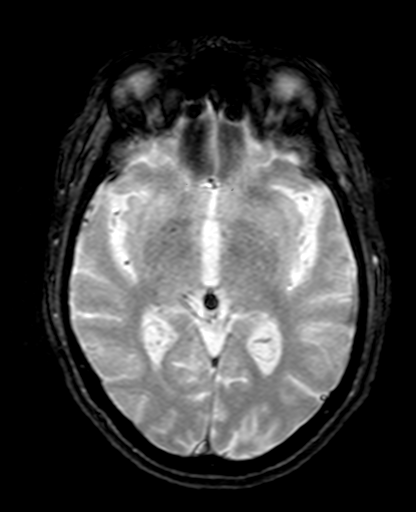
[im 30/30]
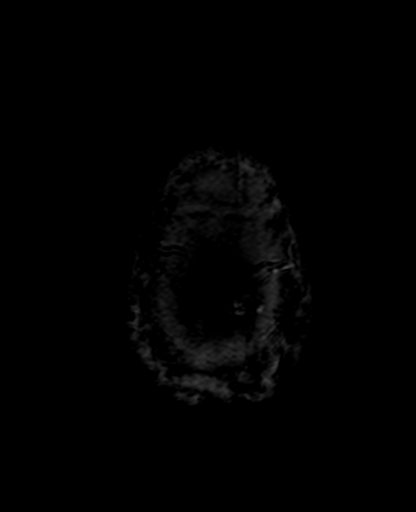

[Series 9: FLAIR · axial · 3.0mm · 0.33mm/px · z∈[-52,+89]mm · 5 of 49 slices shown]
[im 1/49]
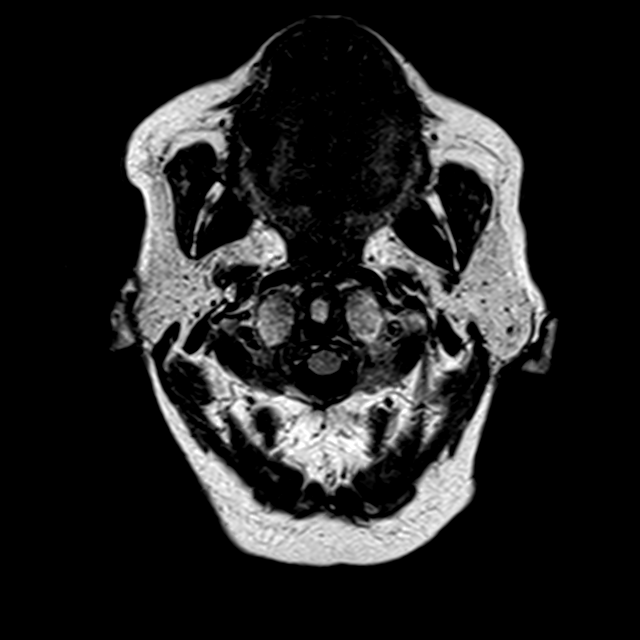
[im 13/49]
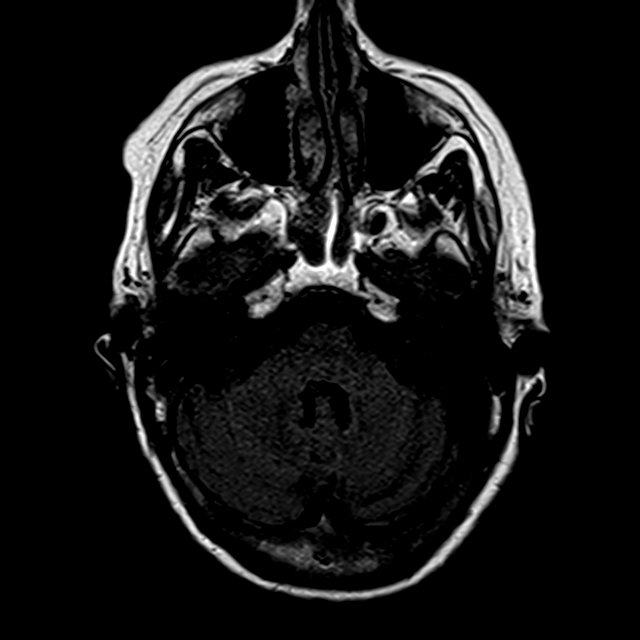
[im 25/49]
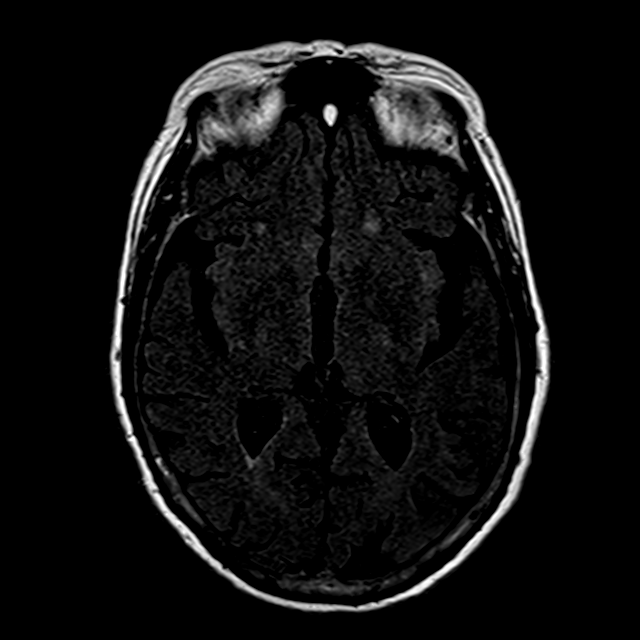
[im 37/49]
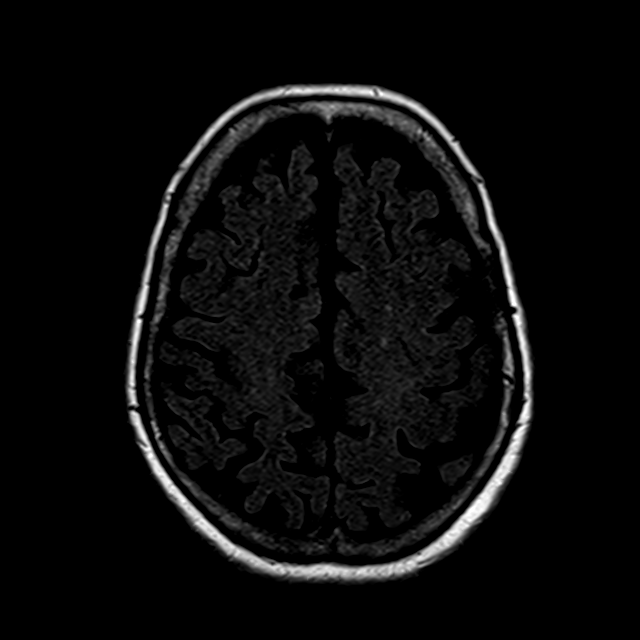
[im 49/49]
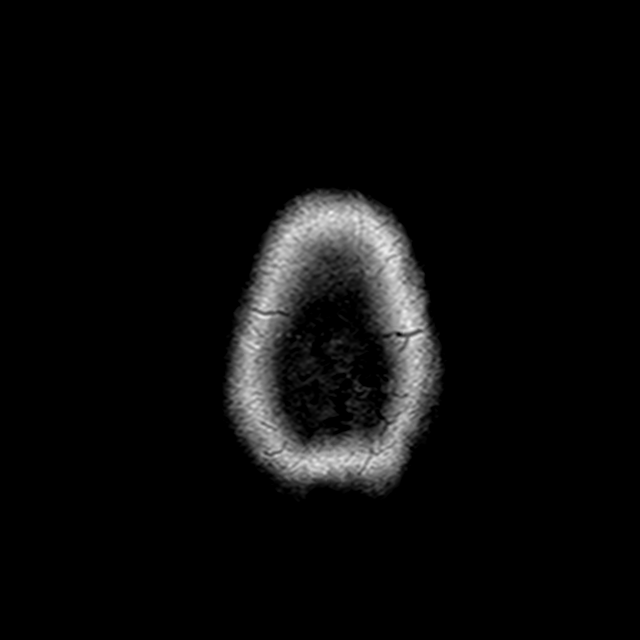

[Series 11: T2 · coronal · 5.0mm · 0.56mm/px · 3 of 28 slices shown (3 of 3)]
[im 1/28]
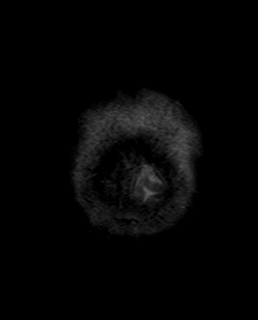
[im 14/28]
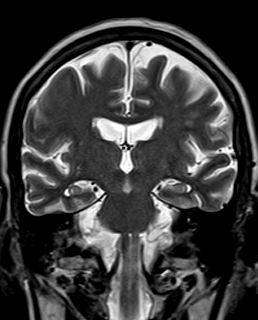
[im 28/28]
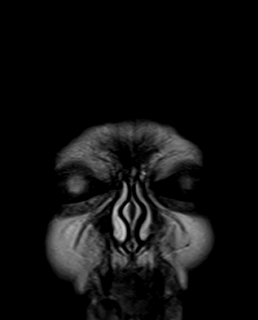

[29 of 48 positions shown; findings below may reference images not displayed]

FINDINGS: Brain: No acute infarction, hemorrhage, hydrocephalus, extra-axial
collection or mass lesion.

Small remote infarcts in bilateral cerebellar hemispheres. Scattered
foci of T2 hyperintensity are seen within the white matter of the
cerebral hemispheres, nonspecific, most likely related to chronic
small vessel ischemia. Mild prominence of the cerebral and
cerebellar sulci as well as supratentorial ventricles, including
temporal horns, suggesting parenchymal volume loss.

Vascular: Normal flow voids.

Skull and upper cervical spine: Normal marrow signal.

Sinuses/Orbits: Negative.

Other: Small right mastoid effusion.
IMPRESSION: 1. No acute intracranial abnormality.
2. Small remote infarcts in bilateral cerebellar hemispheres.
3. Mild parenchymal volume loss and probable sequela of chronic
small vessel ischemia.

## 2020-02-09 ENCOUNTER — Telehealth: Payer: Self-pay | Admitting: Family Medicine

## 2020-02-09 DIAGNOSIS — R590 Localized enlarged lymph nodes: Secondary | ICD-10-CM

## 2020-02-09 DIAGNOSIS — C8594 Non-Hodgkin lymphoma, unspecified, lymph nodes of axilla and upper limb: Secondary | ICD-10-CM

## 2020-02-09 LAB — SURGICAL PATHOLOGY

## 2020-02-09 NOTE — Telephone Encounter (Signed)
Jeani Hawking with the [facility] called with an urgent call report on on patient. 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  Stated they told the patient that the pathologist was working to finalize the result and will call the patient's PCP with any results in the morning. (Epic was down and they did not want to web release results prior to her receiving a call)  [facility] center does not diagnose lymphoma and patient will need a call from primary doctor, as well as a surgical referral placed.  Please call Jeani Hawking directly with questions at [phone]

## 2020-02-09 NOTE — Telephone Encounter (Signed)
Notified pt of results of LN bx. I have ordered referrals to onc and gen surg.

## 2020-02-14 ENCOUNTER — Telehealth: Payer: Self-pay | Admitting: Hematology and Oncology

## 2020-02-14 NOTE — Telephone Encounter (Signed)
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 Elizabeth Mcintyre has been cld and scheduled to see Dr. Lorenso Courier on 6/3 at 8am. Pt aware to arrive 15 minutes early.

## 2020-02-15 ENCOUNTER — Ambulatory Visit: Payer: Medicare Other | Admitting: Family Medicine

## 2020-02-16 ENCOUNTER — Other Ambulatory Visit: Payer: Self-pay

## 2020-02-16 ENCOUNTER — Telehealth: Payer: Self-pay | Admitting: *Deleted

## 2020-02-16 ENCOUNTER — Encounter: Payer: Self-pay | Admitting: Hematology and Oncology

## 2020-02-16 ENCOUNTER — Inpatient Hospital Stay: Payer: Medicare Other | Attending: Hematology and Oncology | Admitting: Hematology and Oncology

## 2020-02-16 ENCOUNTER — Inpatient Hospital Stay: Payer: Medicare Other

## 2020-02-16 VITALS — BP 123/62 | HR 83 | Temp 97.9°F | Resp 16 | Wt 141.2 lb

## 2020-02-16 DIAGNOSIS — C8580 Other specified types of non-Hodgkin lymphoma, unspecified site: Secondary | ICD-10-CM

## 2020-02-16 DIAGNOSIS — R161 Splenomegaly, not elsewhere classified: Secondary | ICD-10-CM | POA: Diagnosis not present

## 2020-02-16 DIAGNOSIS — C8514 Unspecified B-cell lymphoma, lymph nodes of axilla and upper limb: Secondary | ICD-10-CM | POA: Insufficient documentation

## 2020-02-16 DIAGNOSIS — R197 Diarrhea, unspecified: Secondary | ICD-10-CM | POA: Diagnosis not present

## 2020-02-16 DIAGNOSIS — K760 Fatty (change of) liver, not elsewhere classified: Secondary | ICD-10-CM | POA: Insufficient documentation

## 2020-02-16 DIAGNOSIS — Z809 Family history of malignant neoplasm, unspecified: Secondary | ICD-10-CM | POA: Diagnosis not present

## 2020-02-16 DIAGNOSIS — E119 Type 2 diabetes mellitus without complications: Secondary | ICD-10-CM

## 2020-02-16 DIAGNOSIS — I10 Essential (primary) hypertension: Secondary | ICD-10-CM | POA: Insufficient documentation

## 2020-02-16 DIAGNOSIS — K219 Gastro-esophageal reflux disease without esophagitis: Secondary | ICD-10-CM | POA: Insufficient documentation

## 2020-02-16 LAB — CBC WITH DIFFERENTIAL (CANCER CENTER ONLY)
Abs Immature Granulocytes: 0.02 10*3/uL (ref 0.00–0.07)
Basophils Absolute: 0.1 10*3/uL (ref 0.0–0.1)
Basophils Relative: 1 %
Eosinophils Absolute: 0.4 10*3/uL (ref 0.0–0.5)
Eosinophils Relative: 6 %
HCT: 37.7 % (ref 36.0–46.0)
Hemoglobin: 12.6 g/dL (ref 12.0–15.0)
Immature Granulocytes: 0 %
Lymphocytes Relative: 28 %
Lymphs Abs: 2.1 10*3/uL (ref 0.7–4.0)
MCH: 27.1 pg (ref 26.0–34.0)
MCHC: 33.4 g/dL (ref 30.0–36.0)
MCV: 81.1 fL (ref 80.0–100.0)
Monocytes Absolute: 0.6 10*3/uL (ref 0.1–1.0)
Monocytes Relative: 8 %
Neutro Abs: 4.4 10*3/uL (ref 1.7–7.7)
Neutrophils Relative %: 57 %
Platelet Count: 177 10*3/uL (ref 150–400)
RBC: 4.65 MIL/uL (ref 3.87–5.11)
RDW: 14.5 % (ref 11.5–15.5)
WBC Count: 7.6 10*3/uL (ref 4.0–10.5)
nRBC: 0 % (ref 0.0–0.2)

## 2020-02-16 LAB — CMP (CANCER CENTER ONLY)
ALT: 15 U/L (ref 0–44)
AST: 23 U/L (ref 15–41)
Albumin: 4.2 g/dL (ref 3.5–5.0)
Alkaline Phosphatase: 72 U/L (ref 38–126)
Anion gap: 15 (ref 5–15)
BUN: 18 mg/dL (ref 8–23)
CO2: 21 mmol/L — ABNORMAL LOW (ref 22–32)
Calcium: 9.4 mg/dL (ref 8.9–10.3)
Chloride: 103 mmol/L (ref 98–111)
Creatinine: 1.75 mg/dL — ABNORMAL HIGH (ref 0.44–1.00)
GFR, Est AFR Am: 32 mL/min — ABNORMAL LOW (ref >60)
GFR, Estimated: 28 mL/min — ABNORMAL LOW (ref >60)
Glucose, Bld: 118 mg/dL — ABNORMAL HIGH (ref 70–99)
Potassium: 3.4 mmol/L — ABNORMAL LOW (ref 3.5–5.1)
Sodium: 139 mmol/L (ref 135–145)
Total Bilirubin: 0.5 mg/dL (ref 0.3–1.2)
Total Protein: 7.3 g/dL (ref 6.5–8.1)

## 2020-02-16 LAB — HEPATITIS C ANTIBODY: HCV Ab: NONREACTIVE

## 2020-02-16 LAB — HEPATITIS B SURFACE ANTIBODY,QUALITATIVE: Hep B S Ab: NONREACTIVE

## 2020-02-16 LAB — HEPATITIS B SURFACE ANTIGEN: Hepatitis B Surface Ag: NONREACTIVE

## 2020-02-16 LAB — SAVE SMEAR(SSMR), FOR PROVIDER SLIDE REVIEW

## 2020-02-16 LAB — LACTATE DEHYDROGENASE: LDH: 203 U/L — ABNORMAL HIGH (ref 98–192)

## 2020-02-16 LAB — HIV ANTIBODY (ROUTINE TESTING W REFLEX): HIV Screen 4th Generation wRfx: NONREACTIVE

## 2020-02-16 LAB — HEPATITIS B CORE ANTIBODY, TOTAL: Hep B Core Total Ab: NONREACTIVE

## 2020-02-16 LAB — SEDIMENTATION RATE: Sed Rate: 11 mm/hr (ref 0–22)

## 2020-02-16 NOTE — Progress Notes (Signed)
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 Staging in Process 1) 06/06/2019: patient underwent a diagnostic mammogram of the left breast. Calcifications noted in the left breast, recommended 6 month f/u imaging. 2) 02/02/2020: bilateral diagnostic mammogram showed abnormal enlarged lymph nodes with cortical thickening. The largest of these measures 2.2 x 1.7 x 2.0 cm.  3) 02/02/2020: US guided biopsy of left axillary lymph nodes performed. 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 The overall features are atypical and highly suspicious for non-Hodgkin B-cell lymphoma, particularly marginal zone lymphoma. 4) 02/16/2020: establish care with Dr. Lorenso Courier   CHIEF COMPLAINTS/PURPOSE OF CONSULTATION:  "Lymphoma"  HISTORY OF PRESENTING ILLNESS:  Elizabeth Mcintyre 79 y.o. 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   On review of the previous records Elizabeth Mcintyre underwent a diagnostic right mammogram on 06/06/2019 at which time calcifications were noted in the left breast with 51-monthfollow-up imaging recommended.  On 02/02/2020 the patient underwent bilateral diagnostic mammogram which showed abnormal enlarged lymph nodes with cortical thickening.  The largest of these was measured  at 2.2 x 1.7 x 2.0 cm.  The same day and ultrasound-guided biopsy of the left axilla lymph nodes was performed with pathology revealing an atypical lymphoid proliferation suspicious for non-Hodgkin B-cell lymphoma of the left axilla.  The overall features were suspicious for marginal zone lymphoma.  Due to concern for this lymphoma the patient was referred to hematology for further evaluation management.  Of note the patient did have a CT scan of the abdomen performed on 05/10/2019 at which time splenomegaly with the spleen measuring 16.2 cm was found.  On exam today Ms. SOstroffis accompanied by her daughter.  She notes that she initially found a "egg like mass" under her left arm about 2 weeks before her most recent mammogram.  She notes that she also feels a "marble" like not on the back of her head on the left side.  Patient notes that she has been having issues with pouring watery diarrhea that occurs about 4-5 times per week.  She notes that is not not as bad as it used to be and she is currently being followed by gastroenterology for the symptoms.  By the diarrhea the patient notes that she has good appetite, and that her weight has been stable.  She denies having any issues with fevers, chills, sweats, nausea, or vomiting.  She has felt some enlarged nodes on the side of her neck on the left side.  She also endorses having issues with a sore throat.  She has no personal or family history remarkable for blood cancers.  A full 10 point ROS is listed below.  MEDICAL HISTORY:  Past Medical History:  Diagnosis Date  . Asthma   . Bloating 04/26/2019  . 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 DIABETES  MELLITUS, TYPE II 11/09/2007   diet control  . Diverticulosis 03/2011  . GERD (gastroesophageal reflux disease)   . GI bleed   . History of rheumatoid arthritis    during 30's, was treated.  . Hyperlipidemia   . Hypertension   . Osteopenia 2017   Last  bone  density 05/04/2017: -2.4  . PONV (postoperative nausea and vomiting)   . Stress incontinence     SURGICAL HISTORY: Past Surgical History:  Procedure Laterality Date  . ABDOMINAL HYSTERECTOMY    . CARDIAC CATHETERIZATION     X 2, last one in 1998  . CHOLECYSTECTOMY N/A 04/13/2017   Procedure: LAPAROSCOPIC CHOLECYSTECTOMY;  Surgeon: Aviva Signs, MD;  Location: [facility];  Service: General;  Laterality: N/A;  . COLONOSCOPY    . COLONOSCOPY  May 2012   Dr. Olevia Perches: mild diverticulosis, otherwise normal.   . ESOPHAGOGASTRODUODENOSCOPY N/A 01/28/2015   Dr. Gala Romney: reflux esophagitis, Schatzki's ring not manipulated due to recent bleeding  . ESOPHAGOGASTRODUODENOSCOPY N/A 03/30/2015   Dr. Gala Romney: Schatzki's ring s/p Venia Minks dilation, previously noted esophageal ulcer completely healed  . MALONEY DILATION N/A 03/30/2015   Procedure: Venia Minks DILATION;  Surgeon: Daneil Dolin, MD;  Location: [facility] SUITE;  Service: Endoscopy;  Laterality: N/A;    SOCIAL HISTORY: Social History   Socioeconomic History  . Marital status: Widowed    Spouse name: Not on file  . Number of children: 2  . Years of education: Not on file  . Highest education level: Not on file  Occupational History  . Occupation: retired  Tobacco Use  . Smoking status: Never Smoker  . Smokeless tobacco: Never Used  Substance and Sexual Activity  . Alcohol use: No  . Drug use: No  . Sexual activity: Never  Other Topics Concern  . Not on file  Social History Narrative   Ms. Hutt is widowed. Her young grandson lives with her, for whom she shares custody with her daughter, the son's aunt.     Social Determinants of Health   Financial Resource Strain:   . Difficulty of Paying Living Expenses:   Food Insecurity:   . Worried About Charity fundraiser in the Last Year:   . Arboriculturist in the Last Year:   Transportation Needs:   . Film/video editor (Medical):   Marland Kitchen Lack of Transportation (Non-Medical):   Physical  Activity:   . Days of Exercise per Week:   . Minutes of Exercise per Session:   Stress:   . Feeling of Stress :   Social Connections:   . Frequency of Communication with Friends and Family:   . Frequency of Social Gatherings with Friends and Family:   . Attends Religious Services:   . Active Member of Clubs or Organizations:   . Attends Archivist Meetings:   Marland Kitchen Marital Status:   Intimate Partner Violence:   . Fear of Current or Ex-Partner:   . Emotionally Abused:   Marland Kitchen Physically Abused:   . Sexually Abused:     FAMILY HISTORY: Family History  Problem Relation Age of Onset  . Heart disease Mother   . Osteoarthritis Mother   . Sudden death Father   . Single kidney Father   . Other Father        h/o severe MVA injuries  . Hyperlipidemia Sister   . Other Daughter        Myalgias  . Fibromyalgia Daughter   . Allergies Daughter   . Heart  disease Maternal Grandfather   . Sudden death Paternal Grandmother   . Diabetes Paternal Grandfather   . Heart disease Daughter   . Other Daughter        palpitations  . Pulmonary fibrosis Maternal Aunt   . Cancer Paternal Uncle   . Pulmonary fibrosis Maternal Aunt   . Colon cancer Neg Hx     ALLERGIES:  is allergic to codeine phosphate.  MEDICATIONS:  Current Outpatient Medications  Medication Sig Dispense Refill  . acetaminophen (TYLENOL) 325 MG tablet Take 650 mg by mouth every 6 (six) hours as needed for moderate pain.     Marland Kitchen albuterol (VENTOLIN HFA) 108 (90 Base) MCG/ACT inhaler Inhale 2 puffs into the lungs every 6 (six) hours as needed for wheezing. 8.5 g 2  . amLODipine (NORVASC) 10 MG tablet Take 1 tablet (10 mg total) by mouth daily. 90 tablet 1  . Cholecalciferol (VITAMIN D3) 25 MCG (1000 UT) CAPS Take 3,000 Units by mouth.    . dicyclomine (BENTYL) 10 MG capsule Take 1 capsule (10 mg total) by mouth 4 (four) times daily -  before meals and at bedtime. Needs appt for further refills. 360 capsule 0  . levothyroxine  (SYNTHROID) 75 MCG tablet Take 1 tablet (75 mcg total) by mouth daily. 90 tablet 3  . nystatin (MYCOSTATIN) 100000 UNIT/ML suspension Take 5 mLs (500,000 Units total) by mouth 4 (four) times daily. 5 ml swish for 30 seconds and then spit out, QID, do not swallow. 100 mL 0  . pantoprazole (PROTONIX) 40 MG tablet Take 1 tablet (40 mg total) by mouth daily before breakfast. 90 tablet 3  . triamcinolone cream (KENALOG) 0.1 % Apply 1 application topically 2 (two) times daily. For 2-4 weeks as needed 45 g 2  . vitamin B-12 (CYANOCOBALAMIN) 1000 MCG tablet Take 1,000 mcg by mouth daily.     No current facility-administered medications for this visit.    REVIEW OF SYSTEMS:   Constitutional: ( - ) fevers, ( - )  chills , ( - ) night sweats Eyes: ( - ) blurriness of vision, ( - ) double vision, ( - ) watery eyes Ears, nose, mouth, throat, and face: ( - ) mucositis, ( - ) sore throat Respiratory: ( - ) cough, ( - ) dyspnea, ( - ) wheezes Cardiovascular: ( - ) palpitation, ( - ) chest discomfort, ( - ) lower extremity swelling Gastrointestinal:  ( - ) nausea, ( - ) heartburn, ( - ) change in bowel habits Skin: ( - ) abnormal skin rashes Lymphatics: ( - ) new lymphadenopathy, ( - ) easy bruising Neurological: ( - ) numbness, ( - ) tingling, ( - ) new weaknesses Behavioral/Psych: ( - ) mood change, ( - ) new changes  All other systems were reviewed with the patient and are negative.  PHYSICAL EXAMINATION: ECOG PERFORMANCE STATUS: 1 - Symptomatic but completely ambulatory  Vitals:   02/16/20 0810  BP: 123/62  Pulse: 83  Resp: 16  Temp: 97.9 F (36.6 C)  SpO2: 96%   Filed Weights   02/16/20 0810  Weight: 141 lb 3.2 oz (64 kg)    GENERAL: well appearing elderly Caucasian female in NAD  SKIN: skin color, texture, turgor are normal. Small dime sized dry skin lesions on chest, about 5 in number.  EYES: conjunctiva are pink and non-injected, sclera clear NECK: supple, non-tender LYMPH:   palpable lymphadenopathy in the left cervical, left axillary lymph node chains. No supraclavicular adenopathy noted  LUNGS: clear to auscultation and percussion with normal breathing effort HEART: regular rate & rhythm and no murmurs and no lower extremity edema ABDOMEN: soft, non-tender, non-distended, normal bowel sounds. No HSM appreciated.  Musculoskeletal: no cyanosis of digits and no clubbing  PSYCH: alert & oriented x 3, fluent speech NEURO: no focal motor/sensory deficits  LABORATORY DATA:  I have reviewed the data as listed CBC Latest Ref Rng & Units 01/20/2020 12/29/2019 04/20/2019  WBC 3.8 - 10.8 Thousand/uL 7.8 5.4 5.8  Hemoglobin 11.7 - 15.5 g/dL 14.3 12.4 12.3  Hematocrit 35.0 - 45.0 % 43.4 37.1 36.5  Platelets 140 - 400 Thousand/uL 165 141.0(L) 155.0    CMP Latest Ref Rng & Units 12/29/2019 12/29/2019 06/08/2019  Glucose 70 - 99 mg/dL - 129(H) -  BUN 6 - 23 mg/dL - 23 -  Creatinine 0.40 - 1.20 mg/dL - 1.49(H) -  Sodium 135 - 145 mEq/L - 140 -  Potassium 3.5 - 5.1 mEq/L - 4.4 -  Chloride 96 - 112 mEq/L - 105 -  CO2 19 - 32 mEq/L - 27 -  Calcium 8.6 - 10.4 mg/dL 10.0 9.6 9.4  Total Protein 6.0 - 8.3 g/dL - 6.9 -  Total Bilirubin 0.2 - 1.2 mg/dL - 0.4 -  Alkaline Phos 39 - 117 U/L - 62 -  AST 0 - 37 U/L - 18 -  ALT 0 - 35 U/L - 10 -     PATHOLOGY: PATHOLOGY Surgical Pathology  CASE: [id number]  PATIENT: Elizabeth Mcintyre  Flow Pathology Report      Clinical history: adenopathy      DIAGNOSIS:   -B-cell population with lambda light chain excess  -See comment   COMMENT:   Flow cytometric analysis of the lymphoid population shows a B-cell  population expressing B-cell antigens including CD20 associated with  lambda light chain excess. No significant CD5, CD10, CD25 or CD103  expression is identified. Admixed is a relatively abundant T-cell  population with no significant phenotypic changes. The overall findings  are worrisome for a B-cell  lymphoproliferative process.    GATING AND PHENOTYPIC ANALYSIS:   Gated population: Flow cytometric immunophenotyping is performed using  antibodies to the antigens listed in the table below. Electronic gates  are placed around a cell cluster displaying light scatter properties  corresponding to: lymphocytes   Abnormal Cells in sample: See comment   Phenotype of Abnormal Cells: See comment              Lymphoid Antigens    Myeloid Antigens  Miscellaneous  CD2 tested  CD10 tested  CD11b   ND  CD45 tested  CD3 tested  CD19 tested  CD11c   tested  HLA-Dr  ND  CD4 tested  CD20 tested  CD13 ND  CD34 tested  CD5 tested  CD22 tested  CD14 ND  CD38 tested  CD7 tested  CD79b   ND  CD15 ND  CD138   ND  CD8 tested  CD103   tested  CD16 ND  TdT ND  CD25 tested  CD200   tested  CD33 ND  CD123   ND  TCRab   ND  sKappa  tested  CD64 ND  CD41 ND  TCRgd   tested  sLambda  tested  CD117   ND  CD61 ND  CD56 tested  cKappa  ND  MPO ND  CD71 ND  CD57 ND  cLambda  ND    CD235aND        GROSS DESCRIPTION:  Reference tissue case [id number].     Final Diagnosis performed by Susanne Greenhouse, MD.  Electronically signed  02/09/2020  Technical and / or Professional components performed at [facility], [place] [address], [place], [place] [address].  The above tests were developed and their performance characteristics  determined by the [facility] system for the physical and  immunophenotypic characterization of cell populations. They have not  been cleared by the U.S. Food and Drug administration. The FDA has  determined that such clearance or approval is not necessary. This test  is used for clinical purposes. It should not be regarded as  investigational or for research      RADIOGRAPHIC STUDIES: I have personally reviewed the radiological images as listed and agreed  with the findings in the report: no findings concerning for lymphoma in the brain.   MR Brain Wo Contrast  Result Date: 02/08/2020 CLINICAL DATA:  Cognitive deficits. 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 Increased frequency of headaches. EXAM: MRI HEAD WITHOUT CONTRAST TECHNIQUE: Multiplanar, multiecho pulse sequences of the brain and surrounding structures were obtained without intravenous contrast. COMPARISON:  None. FINDINGS: Brain: No acute infarction, hemorrhage, hydrocephalus, extra-axial collection or mass lesion. 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 Scattered foci of T2 hyperintensity are seen within the white matter of the cerebral hemispheres, nonspecific, most likely related to chronic small vessel ischemia. Mild prominence of the cerebral and cerebellar sulci as well as supratentorial ventricles, including temporal horns, suggesting parenchymal volume loss. Vascular: Normal flow voids. Skull and upper cervical spine: Normal marrow signal. Sinuses/Orbits: Negative. Other: Small right mastoid effusion. IMPRESSION: 1. No acute intracranial abnormality. 2. 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 3. Mild parenchymal volume loss and probable sequela of chronic small vessel ischemia. Electronically Signed   By: Pedro Earls M.D.   On: 02/08/2020 15:27   US Soft Tissue Head/Neck (NON-THYROID)  Result Date: 02/08/2020 CLINICAL DATA:  Chronic sore throat. Left submandibular lump x6 months. EXAM: ULTRASOUND OF HEAD/NECK SOFT TISSUES TECHNIQUE: Ultrasound examination of the head and neck soft tissues was performed in the area of clinical concern. COMPARISON:  None. FINDINGS: The visualized portions of the left submandibular gland measure approximately 3.7 x 1.5 x 3.1 cm. The gland does not appear to be hypervascular. The right-sided submandibular gland measures 3.5 x 1.1 x 3.8 cm. IMPRESSION: No significant sonographic abnormality detected. If symptoms  persist, consider follow-up with contrast enhanced cross-sectional imaging (CT or MRI). Electronically Signed   By: Constance Holster M.D.   On: 02/08/2020 21:22   MM DIAG BREAST TOMO BILATERAL  Result Date: 02/02/2020 CLINICAL DATA:  79 year old female presenting with a new lump in the left axilla. She is also being followed for probably benign calcifications in the left breast. EXAM: DIGITAL DIAGNOSTIC LEFT MAMMOGRAM WITH TOMO ULTRASOUND LEFT AXILLA COMPARISON:  Previous exam(s). ACR Breast Density Category b: There are scattered areas of fibroglandular density. FINDINGS: Mammogram: Spot 2D magnification and spot compression tomosynthesis views of the left breast were performed. There grouped coarse heterogeneous calcifications in the upper outer left breast measuring 4 mm which have a slightly coarsened compared to the prior mammogram, but otherwise not significantly changed. There is no new suspicious finding in the left breast. In the left axilla there are two enlarged lymph nodes measuring up to 3.3 cm at the palpable site of concern. On physical exam, I palpate a discrete mobile mass at the site of concern in the low left axilla. Ultrasound: Targeted ultrasound is performed  in the left axilla demonstrating 3 abnormal enlarged lymph nodes with cortical thickening. The largest of these measures 2.2 x 1.7 x 2.0 cm. IMPRESSION: 1. Stable probably benign calcifications in the upper outer left breast spanning 4 mm. 2. Abnormal enlarged left axillary lymph nodes (3) which are suspicious for lymphoma or metastatic disease. RECOMMENDATION: 1. Ultrasound-guided core needle biopsy of a left axillary lymph node. 2. Diagnostic bilateral mammogram in 1 year to confirm stability of the probably benign left breast calcifications and for annual exam of the right breast. I have discussed the findings and recommendations with the patient. If applicable, a reminder letter will be sent to the patient regarding the next  appointment. BI-RADS CATEGORY  4: Suspicious. Electronically Signed   By: Audie Pinto M.D.   On: 02/02/2020 12:10   [place] AXILLARY NODE CORE BIOPSY LEFT  Addendum Date: 02/14/2020   ADDENDUM REPORT: 02/10/2020 09:17 ADDENDUM: Pathology revealed ATYPICAL LYMPHOID PROLIFERATION HIGHLY SUSPICIOUS FOR NON-HODGKIN B CELL LYMPHOMA of the Left axilla. Flow Pathology revealed B-cell population with lambda light chain excess. The overall features are atypical and highly suspicious for non-Hodgkin B-cell lymphoma, particularly marginal zone lymphoma. An excisional biopsy is recommended for further evaluation. This was found to be concordant by Dr. Kristopher Oppenheim, with excisional biopsy recommended. Pathology results were discussed with the patient by telephone, by Dr. Kelton Pillar at [place], [facility] location, on Feb 09, 2020. I spoke with the patient on Feb 08, 2020 to inform her the biopsy results were not ready, she reported doing well after the biopsy with tenderness at the site. Post biopsy instructions and care were reviewed and questions were answered. The patient was encouraged to call [facility] for any additional concerns. Dr. Anitra Lauth will arrange general surgery and medical oncology referrals for the patient. Pathology results reported by Terie Purser, RN on 02/10/2020. Electronically Signed   By: Kristopher Oppenheim M.D.   On: 02/10/2020 09:17   Result Date: 02/14/2020 CLINICAL DATA:  79 year old female with left axillary lymphadenopathy. EXAM: [place] AXILLARY NODE CORE BIOPSY LEFT COMPARISON:  Previous exam(s). PROCEDURE: I met with the patient and we discussed the procedure of ultrasound-guided biopsy, including benefits and alternatives. We discussed the high likelihood of a successful procedure. We discussed the risks of the procedure, including infection, bleeding, tissue injury, clip migration, and inadequate sampling. Informed written consent was given. The usual  time-out protocol was performed immediately prior to the procedure. Using sterile technique and 1% Lidocaine as local anesthetic, under direct ultrasound visualization, a 14 gauge spring-loaded device was used to perform biopsy of a morphologically abnormal left axillary lymph node using a inferior approach. The sample was sent in both formalin and saline. At the conclusion of the procedure Q tissue marker clip was deployed into the biopsy cavity. Follow up 2 view mammogram was performed and dictated separately. IMPRESSION: Ultrasound guided biopsy of a left axillary lymph node. No apparent complications. Electronically Signed: By: Kristopher Oppenheim M.D. On: 02/07/2020 11:46   [place] AXILLA LEFT  Result Date: 02/02/2020 CLINICAL DATA:  79 year old female presenting with a new lump in the left axilla. She is also being followed for probably benign calcifications in the left breast. EXAM: DIGITAL DIAGNOSTIC LEFT MAMMOGRAM WITH TOMO ULTRASOUND LEFT AXILLA COMPARISON:  Previous exam(s). ACR Breast Density Category b: There are scattered areas of fibroglandular density. FINDINGS: Mammogram: Spot 2D magnification and spot compression tomosynthesis views of the left breast were performed. There grouped coarse heterogeneous calcifications in  the upper outer left breast measuring 4 mm which have a slightly coarsened compared to the prior mammogram, but otherwise not significantly changed. There is no new suspicious finding in the left breast. In the left axilla there are two enlarged lymph nodes measuring up to 3.3 cm at the palpable site of concern. On physical exam, I palpate a discrete mobile mass at the site of concern in the low left axilla. Ultrasound: Targeted ultrasound is performed in the left axilla demonstrating 3 abnormal enlarged lymph nodes with cortical thickening. The largest of these measures 2.2 x 1.7 x 2.0 cm. IMPRESSION: 1. Stable probably benign calcifications in the upper outer left breast spanning 4 mm.  2. Abnormal enlarged left axillary lymph nodes (3) which are suspicious for lymphoma or metastatic disease. RECOMMENDATION: 1. Ultrasound-guided core needle biopsy of a left axillary lymph node. 2. Diagnostic bilateral mammogram in 1 year to confirm stability of the probably benign left breast calcifications and for annual exam of the right breast. I have discussed the findings and recommendations with the patient. If applicable, a reminder letter will be sent to the patient regarding the next appointment. BI-RADS CATEGORY  4: Suspicious. Electronically Signed   By: Audie Pinto M.D.   On: 02/02/2020 12:10    ASSESSMENT & PLAN Elizabeth Mcintyre 79 y.o. 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  The extent of the lymphoma is not known at this time as there have not been complete staging studies. Of note, on 05/10/2019 the patient had a CT scan of the abdomen for abdominal pain and was found to have splenomegaly, maximum coronal span 16.2 cm, new compared to prior examination which may represent a site of lymphoma.   At this time we will need to complete staging imaging as well as additional work-up to plan for possible treatment.  In the event that her marginal zone lymphoma is isolated solely to the left axilla we could consider radiation therapy alone following a excisional biopsy in order to confirm diagnosis.  At this time I think the patient would benefit from performing a PET CT scan in order to determine if there is increased activity within the enlarged spleen.  Of note the patient does have hepatic steatosis, but no cirrhotic morphology which could explain the current splenomegaly.  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 Staging in Process --today will order baseline CMP, CBC, ESR, and blood smear --will order Hep B, Hep C, and HIV serologies  --will order a PET CT scan to complete the staging imaging. [facility] at [facility] --agree with consultation to surgery for consideration of an excisional biopsy of an accessible lymph node. May want to wait for the PET CT to determine if there are more easily accessed nodes.  --treatment for this indolent lymphoma can consist of radiation for Stage I, II or systemic therapy/observation for Stage III, IV --RTC in approximately 2 weeks following final staging to determine the course of treatmet  Orders Placed This Encounter  Procedures  .  NM PET Image Initial (PI) Skull Base To Thigh    Standing Status:   Future    Standing Expiration Date:   02/15/2021    Order Specific Question:   If indicated for the ordered procedure, I authorize the administration of a radiopharmaceutical per Radiology protocol    Answer:   Yes    Order Specific Question:   Preferred imaging location?    Answer:   [facility]    Order Specific Question:   Radiology Contrast Protocol - do NOT remove file path    Answer:   [email or web address]  . CBC with Differential ([place] Only)    Standing Status:   Future    Standing Expiration Date:   02/15/2021  . CMP ([place] only)    Standing Status:   Future    Standing Expiration Date:   02/15/2021  . Lactate dehydrogenase (LDH)    Standing Status:   Future    Standing Expiration Date:   02/15/2021  . Save Smear (SSMR)    Standing Status:   Future    Standing Expiration Date:   02/15/2021  . Sedimentation rate    Standing Status:   Future    Standing Expiration Date:   02/15/2021  . HIV antibody (with reflex)     Standing Status:   Future    Standing Expiration Date:   02/15/2021  . Hepatitis C antibody    Standing Status:   Future    Standing Expiration Date:   02/15/2021  . Hepatitis B surface antibody    Standing Status:   Future    Standing Expiration Date:   02/15/2021  . Hepatitis B surface antigen    Standing Status:   Future    Standing Expiration Date:   02/15/2021  . Hepatitis B core antibody, total    Standing Status:   Future    Standing Expiration Date:   02/15/2021    All questions were answered. The patient knows to call the clinic with any problems, questions or concerns.  A total of more than 60 minutes were spent on this encounter and over half of that time was spent on counseling and coordination of care as outlined above.   Ledell Peoples, MD Department of Hematology/Oncology [place] [facility] Phone: [phone] Pager: [phone] Email: [email or web address]  02/16/2020 9:34 AM

## 2020-02-16 NOTE — Telephone Encounter (Signed)
[facility] regarding scheduling an appt for this patient to have an excisional lymph node biopsy. Message left for [place],  [place] pt referral coordinator to return my call @ [phone]. Original referral sent by Dr. Julien Nordmann

## 2020-02-17 ENCOUNTER — Other Ambulatory Visit: Payer: Self-pay | Admitting: Hematology and Oncology

## 2020-02-17 ENCOUNTER — Telehealth: Payer: Self-pay | Admitting: Hematology and Oncology

## 2020-02-17 DIAGNOSIS — C8580 Other specified types of non-Hodgkin lymphoma, unspecified site: Secondary | ICD-10-CM

## 2020-02-17 NOTE — Telephone Encounter (Signed)
Scheduled per los. Called and spoke with patient. Confirmed appt 

## 2020-02-19 ENCOUNTER — Other Ambulatory Visit: Payer: Self-pay | Admitting: Family Medicine

## 2020-02-23 ENCOUNTER — Other Ambulatory Visit: Payer: Self-pay | Admitting: Gastroenterology

## 2020-02-23 DIAGNOSIS — K2100 Gastro-esophageal reflux disease with esophagitis, without bleeding: Secondary | ICD-10-CM

## 2020-02-23 DIAGNOSIS — R1030 Lower abdominal pain, unspecified: Secondary | ICD-10-CM

## 2020-02-23 DIAGNOSIS — R197 Diarrhea, unspecified: Secondary | ICD-10-CM

## 2020-02-29 ENCOUNTER — Ambulatory Visit (HOSPITAL_COMMUNITY)
Admission: RE | Admit: 2020-02-29 | Discharge: 2020-02-29 | Disposition: A | Discharge status: Home / Self Care | Payer: Medicare Other | Source: Ambulatory Visit | Attending: Hematology and Oncology | Admitting: Hematology and Oncology

## 2020-02-29 ENCOUNTER — Other Ambulatory Visit: Payer: Self-pay | Admitting: Hematology and Oncology

## 2020-02-29 ENCOUNTER — Other Ambulatory Visit: Payer: Self-pay

## 2020-02-29 DIAGNOSIS — I700 Atherosclerosis of aorta: Secondary | ICD-10-CM | POA: Diagnosis not present

## 2020-02-29 DIAGNOSIS — I2510 Atherosclerotic heart disease of native coronary artery without angina pectoris: Secondary | ICD-10-CM | POA: Insufficient documentation

## 2020-02-29 DIAGNOSIS — R161 Splenomegaly, not elsewhere classified: Secondary | ICD-10-CM | POA: Diagnosis not present

## 2020-02-29 DIAGNOSIS — C8580 Other specified types of non-Hodgkin lymphoma, unspecified site: Secondary | ICD-10-CM | POA: Insufficient documentation

## 2020-02-29 DIAGNOSIS — C8590 Non-Hodgkin lymphoma, unspecified, unspecified site: Secondary | ICD-10-CM | POA: Diagnosis not present

## 2020-02-29 IMAGING — PT NM PET TUM IMG INITIAL (PI) SKULL BASE T - THIGH
1 of 8 series · 1 of 25 positions shown · non-contrast
Comparison: Abdomen/pelvis CT [DATE]

CLINICAL DATA: Initial treatment strategy for lymphoma.

EXAM:
NUCLEAR MEDICINE PET SKULL BASE TO THIGH
TECHNIQUE: 6.9 mCi F-18 FDG was injected intravenously. Full-ring PET imaging
was performed from the skull base to thigh after the radiotracer. CT
data was obtained and used for attenuation correction and anatomic
localization.
Fasting blood glucose: 98 mg/dl

[Series 3: ct sk_thigh 5.0 hd_fov · axial · 5.0mm · 1.17mm/px · 1 of 221 slices shown]
[im 56/221  brain]
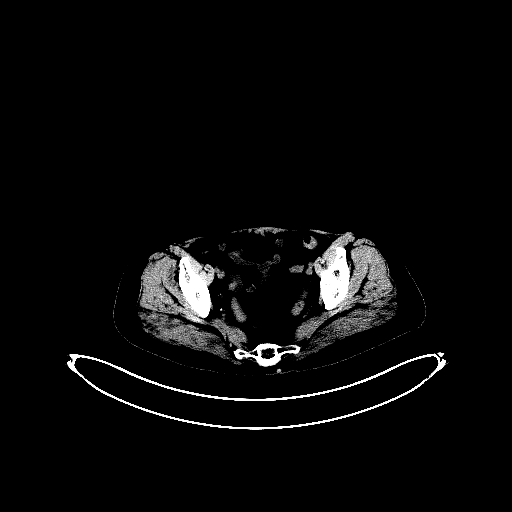

[1 of 25 positions shown; findings below may reference images not displayed]

FINDINGS: Mediastinal blood pool activity: SUV max

Liver activity: SUV max

NECK: 7 mm posterior cervical node on image [DATE] is hypermetabolic
with SUV max = 5.5. No other hypermetabolic cervical
lymphadenopathy.

Incidental CT findings: none

CHEST: Hypermetabolic lymphadenopathy is seen in both subpectoral
and axillary regions (left greater than right), mediastinum, and
both hilar regions.

Index left axillary node on 61/3 measures 18 mm with SUV max =
16.8.

Right axillary node measures 9 mm short axis on 68/2 with SUV max =
7.5.

Low right paratracheal node measuring 12 mm short axis on 64/3
demonstrates SUV max = 9.1.

Incidental CT findings: No cardiomegaly. Coronary artery
calcification is evident. Atherosclerotic calcification is noted in
the wall of the thoracic aorta. No suspicious pulmonary nodule or
mass.

ABDOMEN/PELVIS: Spleen is enlarged at 17.8 cm craniocaudal length
with marked diffuse hypermetabolism ( SUV max = 8.5).

No hypermetabolic lymphadenopathy in the abdomen.

with SUV max = 5.3.

9 mm short axis right groin lymph node (184/3) demonstrates SUV max
= 3.7. Low level hypermetabolism identified in small left groin
lymph nodes.

Incidental CT findings: Gallbladder surgically absent. Small cyst
noted right kidney. Mild diverticular changes noted left colon.

SKELETON: Small hypermetabolic focus identified in the spinous
process of L5 ( SUV max = 3.7). No underlying bony lesion by CT
imaging.

Incidental CT findings: none
IMPRESSION: 1. Hypermetabolic lymph nodes identified in the posterior left neck,
both axillary/subpectoral regions, mediastinum, hila, right external
iliac chain, and right groin. Sites represent a combination of
[HOSPITAL] 3, 4, and 5 disease.
2. Splenomegaly with diffuse splenic hypermetabolism compatible with
lymphoma involvement.
3.  Aortic Atherosclerois ([WZ]-170.0)

## 2020-02-29 MED ORDER — FLUDEOXYGLUCOSE F - 18 (FDG) INJECTION
6.9000 | Freq: Once | INTRAVENOUS | Status: AC | PRN
  Administered 2020-02-29: 6.9 via INTRAVENOUS

## 2020-02-29 NOTE — Progress Notes (Signed)
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 Calcifications noted in the left breast, recommended 6 month f/u imaging. 2) 02/02/2020: bilateral diagnostic mammogram showed abnormal enlarged lymph nodes with cortical thickening. The largest of these measures 2.2 x 1.7 x 2.0 cm.  3) 02/02/2020: US guided biopsy of left axillary lymph nodes performed. 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 The overall features are atypical and highly suspicious for non-Hodgkin B-cell lymphoma, particularly marginal zone lymphoma. 4) 02/16/2020: establish care with Dr. Lorenso Courier  5) 02/29/2020: PET CT scan performed, showed hypermetabolic lymph nodes identified in the posterior left neck, both axillary/subpectoral regions, mediastinum, hila, right external iliac chain, and right groin. Massive splenomegaly of 17.8 cm noted as well.   Interval History:  Elizabeth Mcintyre 79 y.o. 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 The patient's last visit was on 02/15/2020 at which time she established care. In the interim since the last visit the patient has undergone a PET CT scan which showed hypermetabolic lymph nodes identified in the posterior left neck, both axillary/subpectoral  regions, mediastinum, hila, right external iliac chain, and right groin. Splenomegaly of 17.8cm was noted as well.   On exam today Elizabeth Mcintyre notes her biggest issue has been persistent diarrhea.  She reports that she can have diarrhea up to six times per day and it tends to be liquid in nature.  She notes that this has been a consistent issue on and off for at least the past several years.  She has been evaluated by GI who have not found a clear etiology.  She is currently taking probiotic therapy as well as Bentyl for cramping, but these have not relieved the diarrhea.    She also notes that the lymph nodes under her left arm are quite tender and uncomfortable and she is hoping that we find some way to shrink these.  She also notes that when she bends over to tie her shoes she feels a tightness in her stomach.  She notes that her appetite has been quite good and she has not had any issues with nausea or vomiting since our last visit.  Her weight has remained relatively stable.  She denies having any fevers, chills, sweats, shortness of breath, or chest pain.  A full 10 point ROS is listed below.  MEDICAL HISTORY:  Past Medical History:  Diagnosis Date   Asthma    Bloating 04/26/2019   Chronic SI joint pain    was on tramadol   Depression with anxiety 04/03/2011   DIABETES MELLITUS, TYPE II 11/09/2007   diet control   Diverticulosis 03/2011   GERD (gastroesophageal reflux disease)    GI bleed    History of rheumatoid arthritis    during 30's, was treated.   Hyperlipidemia    Hypertension    Osteopenia 2017   Last  bone density 05/04/2017: -2.4  PONV (postoperative nausea and vomiting)    Stress incontinence     SURGICAL HISTORY: Past Surgical History:  Procedure Laterality Date   ABDOMINAL HYSTERECTOMY     CARDIAC CATHETERIZATION     X 2, last one in [place] N/A 04/13/2017   Procedure: LAPAROSCOPIC CHOLECYSTECTOMY;  Surgeon: Aviva Signs, MD;   Location: [facility];  Service: General;  Laterality: N/A;   COLONOSCOPY     COLONOSCOPY  May 2012   Dr. Olevia Perches: mild diverticulosis, otherwise normal.    ESOPHAGOGASTRODUODENOSCOPY N/A 01/28/2015   Dr. Gala Romney: reflux esophagitis, Schatzki's ring not manipulated due to recent bleeding   ESOPHAGOGASTRODUODENOSCOPY N/A 03/30/2015   Dr. Gala Romney: Schatzki's ring s/p Venia Minks dilation, previously noted esophageal ulcer completely healed   MALONEY DILATION N/A 03/30/2015   Procedure: Venia Minks DILATION;  Surgeon: Daneil Dolin, MD;  Location: [facility];  Service: Endoscopy;  Laterality: N/A;    SOCIAL HISTORY: Social History   Socioeconomic History   Marital status: Widowed    Spouse name: Not on file   Number of children: 2   Years of education: Not on file   Highest education level: Not on file  Occupational History   Occupation: retired  Tobacco Use   Smoking status: Never Smoker   Smokeless tobacco: Never Used  Scientific laboratory technician Use: Never used  Substance and Sexual Activity   Alcohol use: No   Drug use: No   Sexual activity: Never  Other Topics Concern   Not on file  Social History Narrative   Ms. Kohrs is widowed. Her young grandson lives with her, for whom she shares custody with her daughter, the son's aunt.     Social Determinants of Health   Financial Resource Strain:    Difficulty of Paying Living Expenses:   Food Insecurity:    Worried About Charity fundraiser in the Last Year:    Arboriculturist in the Last Year:   Transportation Needs:    Film/video editor (Medical):    Lack of Transportation (Non-Medical):   Physical Activity:    Days of Exercise per Week:    Minutes of Exercise per Session:   Stress:    Feeling of Stress :   Social Connections:    Frequency of Communication with Friends and Family:    Frequency of Social Gatherings with Friends and Family:    Attends Religious Services:    Active Member of Clubs or  Organizations:    Attends Music therapist:    Marital Status:   Intimate Partner Violence:    Fear of Current or Ex-Partner:    Emotionally Abused:    Physically Abused:    Sexually Abused:     FAMILY HISTORY: Family History  Problem Relation Age of Onset   Heart disease Mother    Osteoarthritis Mother    Sudden death Father    Single kidney Father    Other Father        h/o severe MVA injuries   Hyperlipidemia Sister    Other Daughter        Myalgias   Fibromyalgia Daughter    Allergies Daughter    Heart disease Maternal Grandfather    Sudden death Paternal Grandmother    Diabetes Paternal Grandfather    Heart disease Daughter    Other Daughter        palpitations   Pulmonary fibrosis Maternal Aunt    Cancer Paternal Uncle  Pulmonary fibrosis Maternal Aunt    Colon cancer Neg Hx     ALLERGIES:  is allergic to codeine phosphate.  MEDICATIONS:  Current Outpatient Medications  Medication Sig Dispense Refill   acetaminophen (TYLENOL) 325 MG tablet Take 650 mg by mouth every 6 (six) hours as needed for moderate pain.      albuterol (VENTOLIN HFA) 108 (90 Base) MCG/ACT inhaler Inhale 2 puffs into the lungs every 6 (six) hours as needed for wheezing. 8.5 g 2   amLODipine (NORVASC) 10 MG tablet Take 1 tablet (10 mg total) by mouth daily. 90 tablet 1   Cholecalciferol (VITAMIN D3) 25 MCG (1000 UT) CAPS Take 3,000 Units by mouth.     dicyclomine (BENTYL) 10 MG capsule Take 1 capsule (10 mg total) by mouth 4 (four) times daily -  before meals and at bedtime. Needs appt for further refills. 360 capsule 0   levothyroxine (SYNTHROID) 75 MCG tablet Take 1 tablet (75 mcg total) by mouth daily. 90 tablet 3   nystatin (MYCOSTATIN) 100000 UNIT/ML suspension Take 5 mLs (500,000 Units total) by mouth 4 (four) times daily. 5 ml swish for 30 seconds and then spit out, QID, do not swallow. 100 mL 0   pantoprazole (PROTONIX) 40 MG tablet TAKE 1  TABLET BY MOUTH DAILY BEFORE BREAKFAST 90 tablet 3   triamcinolone cream (KENALOG) 0.1 % Apply 1 application topically 2 (two) times daily. For 2-4 weeks as needed 45 g 2   vitamin B-12 (CYANOCOBALAMIN) 1000 MCG tablet Take 1,000 mcg by mouth daily.     No current facility-administered medications for this visit.    REVIEW OF SYSTEMS:   Constitutional: ( - ) fevers, ( - )  chills , ( - ) night sweats Eyes: ( - ) blurriness of vision, ( - ) double vision, ( - ) watery eyes Ears, nose, mouth, throat, and face: ( - ) mucositis, ( - ) sore throat Respiratory: ( - ) cough, ( - ) dyspnea, ( - ) wheezes Cardiovascular: ( - ) palpitation, ( - ) chest discomfort, ( - ) lower extremity swelling Gastrointestinal:  ( - ) nausea, ( - ) heartburn, ( - ) change in bowel habits Skin: ( - ) abnormal skin rashes Lymphatics: ( - ) new lymphadenopathy, ( - ) easy bruising Neurological: ( - ) numbness, ( - ) tingling, ( - ) new weaknesses Behavioral/Psych: ( - ) mood change, ( - ) new changes  All other systems were reviewed with the patient and are negative.  PHYSICAL EXAMINATION: ECOG PERFORMANCE STATUS: 1 - Symptomatic but completely ambulatory  Vitals:   03/01/20 0852  BP: (!) 150/65  Pulse: 83  Resp: 18  Temp: (!) 97.3 F (36.3 C)  SpO2: 100%   Filed Weights   03/01/20 0852  Weight: 139 lb 6.4 oz (63.2 kg)    GENERAL: well appearing elderly Caucasian female in NAD  SKIN: skin color, texture, turgor are normal. Small dime sized dry skin lesions on chest, about 5 in number.  EYES: conjunctiva are pink and non-injected, sclera clear NECK: supple, non-tender LYMPH:  palpable lymphadenopathy in the left cervical, left axillary lymph node chains. No supraclavicular adenopathy noted  LUNGS: clear to auscultation and percussion with normal breathing effort HEART: regular rate & rhythm and no murmurs and no lower extremity edema ABDOMEN: soft, non-tender, non-distended, normal bowel sounds. HSM  difficult to appreciate. Tenderness to palpation under left ribs.  Musculoskeletal: no cyanosis of digits and no clubbing  PSYCH:  alert & oriented x 3, fluent speech NEURO: no focal motor/sensory deficits  LABORATORY DATA:  I have reviewed the data as listed CBC Latest Ref Rng & Units 03/01/2020 02/16/2020 01/20/2020  WBC 4.0 - 10.5 K/uL 5.9 7.6 7.8  Hemoglobin 12.0 - 15.0 g/dL 12.4 12.6 14.3  Hematocrit 36 - 46 % 37.7 37.7 43.4  Platelets 150 - 400 K/uL 130(L) 177 165    CMP Latest Ref Rng & Units 03/01/2020 02/16/2020 12/29/2019  Glucose 70 - 99 mg/dL 128(H) 118(H) -  BUN 8 - 23 mg/dL 20 18 -  Creatinine 0.44 - 1.00 mg/dL 1.58(H) 1.75(H) -  Sodium 135 - 145 mmol/L 141 139 -  Potassium 3.5 - 5.1 mmol/L 4.0 3.4(L) -  Chloride 98 - 111 mmol/L 106 103 -  CO2 22 - 32 mmol/L 24 21(L) -  Calcium 8.9 - 10.3 mg/dL 9.6 9.4 10.0  Total Protein 6.5 - 8.1 g/dL 7.2 7.3 -  Total Bilirubin 0.3 - 1.2 mg/dL 0.4 0.5 -  Alkaline Phos 38 - 126 U/L 77 72 -  AST 15 - 41 U/L 20 23 -  ALT 0 - 44 U/L 10 15 -    RADIOGRAPHIC STUDIES: I have personally reviewed the radiological images as listed and agreed with the findings in the report: FDG avid spleen and axillary lymph nodes. Involvement of cervical and inguinal nodes as well.  MR Brain Wo Contrast  Result Date: 02/08/2020 CLINICAL DATA:  Cognitive deficits. 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 Increased frequency of headaches. EXAM: MRI HEAD WITHOUT CONTRAST TECHNIQUE: Multiplanar, multiecho pulse sequences of the brain and surrounding structures were obtained without intravenous contrast. COMPARISON:  None. FINDINGS: Brain: No acute infarction, hemorrhage, hydrocephalus, extra-axial collection or mass lesion. 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 Scattered foci of T2 hyperintensity are seen within the white matter of the cerebral hemispheres, nonspecific, most likely related to chronic small vessel ischemia. Mild prominence of the cerebral and  cerebellar sulci as well as supratentorial ventricles, including temporal horns, suggesting parenchymal volume loss. Vascular: Normal flow voids. Skull and upper cervical spine: Normal marrow signal. Sinuses/Orbits: Negative. Other: Small right mastoid effusion. IMPRESSION: 1. No acute intracranial abnormality. 2. 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 3. Mild parenchymal volume loss and probable sequela of chronic small vessel ischemia. Electronically Signed   By: Pedro Earls M.D.   On: 02/08/2020 15:27   US Soft Tissue Head/Neck (NON-THYROID)  Result Date: 02/08/2020 CLINICAL DATA:  Chronic sore throat. Left submandibular lump x6 months. EXAM: ULTRASOUND OF HEAD/NECK SOFT TISSUES TECHNIQUE: Ultrasound examination of the head and neck soft tissues was performed in the area of clinical concern. COMPARISON:  None. FINDINGS: The visualized portions of the left submandibular gland measure approximately 3.7 x 1.5 x 3.1 cm. The gland does not appear to be hypervascular. The right-sided submandibular gland measures 3.5 x 1.1 x 3.8 cm. IMPRESSION: No significant sonographic abnormality detected. If symptoms persist, consider follow-up with contrast enhanced cross-sectional imaging (CT or MRI). Electronically Signed   By: Constance Holster M.D.   On: 02/08/2020 21:22   NM PET Image Initial (PI) Skull Base To Thigh  Result Date: 02/29/2020 CLINICAL DATA:  Initial treatment strategy for lymphoma. EXAM: NUCLEAR MEDICINE PET SKULL BASE TO THIGH TECHNIQUE: 6.9 mCi F-18 FDG was injected intravenously. Full-ring PET imaging was performed from the skull base to thigh after the radiotracer. CT data was obtained and used for attenuation correction and anatomic localization. Fasting blood glucose: 98 mg/dl COMPARISON:  Abdomen/pelvis CT  05/10/2019 FINDINGS: Mediastinal blood pool activity: SUV max 2.5 Liver activity: SUV max 3.8 NECK: 7 mm posterior cervical node on image 43/2 is  hypermetabolic with SUV max = 5.5. No other hypermetabolic cervical lymphadenopathy. Incidental CT findings: none CHEST: Hypermetabolic lymphadenopathy is seen in both subpectoral and axillary regions (left greater than right), mediastinum, and both hilar regions. Index left axillary node on 61/3 measures 18 mm with SUV max = 16.8. Right axillary node measures 9 mm short axis on 68/2 with SUV max = 7.5. Low right paratracheal node measuring 12 mm short axis on 64/3 demonstrates SUV max = 9.1. Incidental CT findings: No cardiomegaly. Coronary artery calcification is evident. Atherosclerotic calcification is noted in the wall of the thoracic aorta. No suspicious pulmonary nodule or mass. ABDOMEN/PELVIS: Spleen is enlarged at 17.8 cm craniocaudal length with marked diffuse hypermetabolism ( SUV max = 8.5). No hypermetabolic lymphadenopathy in the abdomen. 7 mm short axis right common iliac node on 761/4 is hypermetabolic with SUV max = 5.3. 9 mm short axis right groin lymph node (184/3) demonstrates SUV max = 3.7. Low level hypermetabolism identified in small left groin lymph nodes. Incidental CT findings: Gallbladder surgically absent. Small cyst noted right kidney. Mild diverticular changes noted left colon. SKELETON: Small hypermetabolic focus identified in the spinous process of L5 ( SUV max = 3.7). No underlying bony lesion by CT imaging. Incidental CT findings: none IMPRESSION: 1. Hypermetabolic lymph nodes identified in the posterior left neck, both axillary/subpectoral regions, mediastinum, hila, right external iliac chain, and right groin. Sites represent a combination of Deauville 3, 4, and 5 disease. 2. Splenomegaly with diffuse splenic hypermetabolism compatible with lymphoma involvement. 3.  Aortic Atherosclerois (ICD10-170.0) Electronically Signed   By: Misty Stanley M.D.   On: 02/29/2020 15:09   MM DIAG BREAST TOMO BILATERAL  Result Date: 02/02/2020 CLINICAL DATA:  79 year old female presenting with  a new lump in the left axilla. She is also being followed for probably benign calcifications in the left breast. EXAM: DIGITAL DIAGNOSTIC LEFT MAMMOGRAM WITH TOMO ULTRASOUND LEFT AXILLA COMPARISON:  Previous exam(s). ACR Breast Density Category b: There are scattered areas of fibroglandular density. FINDINGS: Mammogram: Spot 2D magnification and spot compression tomosynthesis views of the left breast were performed. There grouped coarse heterogeneous calcifications in the upper outer left breast measuring 4 mm which have a slightly coarsened compared to the prior mammogram, but otherwise not significantly changed. There is no new suspicious finding in the left breast. In the left axilla there are two enlarged lymph nodes measuring up to 3.3 cm at the palpable site of concern. On physical exam, I palpate a discrete mobile mass at the site of concern in the low left axilla. Ultrasound: Targeted ultrasound is performed in the left axilla demonstrating 3 abnormal enlarged lymph nodes with cortical thickening. The largest of these measures 2.2 x 1.7 x 2.0 cm. IMPRESSION: 1. Stable probably benign calcifications in the upper outer left breast spanning 4 mm. 2. Abnormal enlarged left axillary lymph nodes (3) which are suspicious for lymphoma or metastatic disease. RECOMMENDATION: 1. Ultrasound-guided core needle biopsy of a left axillary lymph node. 2. Diagnostic bilateral mammogram in 1 year to confirm stability of the probably benign left breast calcifications and for annual exam of the right breast. I have discussed the findings and recommendations with the patient. If applicable, a reminder letter will be sent to the patient regarding the next appointment. BI-RADS CATEGORY  4: Suspicious. Electronically Signed   By: Jac Canavan.D.  On: 02/02/2020 12:10   [place] AXILLARY NODE CORE BIOPSY LEFT  Addendum Date: 02/14/2020   ADDENDUM REPORT: 02/10/2020 09:17 ADDENDUM: Pathology revealed ATYPICAL LYMPHOID  PROLIFERATION HIGHLY SUSPICIOUS FOR NON-HODGKIN B CELL LYMPHOMA of the Left axilla. Flow Pathology revealed B-cell population with lambda light chain excess. The overall features are atypical and highly suspicious for non-Hodgkin B-cell lymphoma, particularly marginal zone lymphoma. An excisional biopsy is recommended for further evaluation. This was found to be concordant by Dr. Kristopher Oppenheim, with excisional biopsy recommended. Pathology results were discussed with the patient by telephone, by Dr. Kelton Pillar at [place], [facility] location, on Feb 09, 2020. I spoke with the patient on Feb 08, 2020 to inform her the biopsy results were not ready, she reported doing well after the biopsy with tenderness at the site. Post biopsy instructions and care were reviewed and questions were answered. The patient was encouraged to call [facility] for any additional concerns. Dr. Anitra Lauth will arrange general surgery and medical oncology referrals for the patient. Pathology results reported by Terie Purser, RN on 02/10/2020. Electronically Signed   By: Kristopher Oppenheim M.D.   On: 02/10/2020 09:17   Result Date: 02/14/2020 CLINICAL DATA:  79 year old female with left axillary lymphadenopathy. EXAM: [place] AXILLARY NODE CORE BIOPSY LEFT COMPARISON:  Previous exam(s). PROCEDURE: I met with the patient and we discussed the procedure of ultrasound-guided biopsy, including benefits and alternatives. We discussed the high likelihood of a successful procedure. We discussed the risks of the procedure, including infection, bleeding, tissue injury, clip migration, and inadequate sampling. Informed written consent was given. The usual time-out protocol was performed immediately prior to the procedure. Using sterile technique and 1% Lidocaine as local anesthetic, under direct ultrasound visualization, a 14 gauge spring-loaded device was used to perform biopsy of a morphologically abnormal left  axillary lymph node using a inferior approach. The sample was sent in both formalin and saline. At the conclusion of the procedure Q tissue marker clip was deployed into the biopsy cavity. Follow up 2 view mammogram was performed and dictated separately. IMPRESSION: Ultrasound guided biopsy of a left axillary lymph node. No apparent complications. Electronically Signed: By: Kristopher Oppenheim M.D. On: 02/07/2020 11:46   [place] AXILLA LEFT  Result Date: 02/02/2020 CLINICAL DATA:  79 year old female presenting with a new lump in the left axilla. She is also being followed for probably benign calcifications in the left breast. EXAM: DIGITAL DIAGNOSTIC LEFT MAMMOGRAM WITH TOMO ULTRASOUND LEFT AXILLA COMPARISON:  Previous exam(s). ACR Breast Density Category b: There are scattered areas of fibroglandular density. FINDINGS: Mammogram: Spot 2D magnification and spot compression tomosynthesis views of the left breast were performed. There grouped coarse heterogeneous calcifications in the upper outer left breast measuring 4 mm which have a slightly coarsened compared to the prior mammogram, but otherwise not significantly changed. There is no new suspicious finding in the left breast. In the left axilla there are two enlarged lymph nodes measuring up to 3.3 cm at the palpable site of concern. On physical exam, I palpate a discrete mobile mass at the site of concern in the low left axilla. Ultrasound: Targeted ultrasound is performed in the left axilla demonstrating 3 abnormal enlarged lymph nodes with cortical thickening. The largest of these measures 2.2 x 1.7 x 2.0 cm. IMPRESSION: 1. Stable probably benign calcifications in the upper outer left breast spanning 4 mm. 2. Abnormal enlarged left axillary lymph nodes (3) which are suspicious for lymphoma or metastatic disease.  RECOMMENDATION: 1. Ultrasound-guided core needle biopsy of a left axillary lymph node. 2. Diagnostic bilateral mammogram in 1 year to confirm stability of  the probably benign left breast calcifications and for annual exam of the right breast. I have discussed the findings and recommendations with the patient. If applicable, a reminder letter will be sent to the patient regarding the next appointment. BI-RADS CATEGORY  4: Suspicious. Electronically Signed   By: Audie Pinto M.D.   On: 02/02/2020 12:10    Elizabeth Mcintyre 79 y.o. 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   PET CT scan was performed in the interim which showed hypermetabolic lymph nodes identified in the posterior left neck, both axillary/subpectoral regions, mediastinum, hila, right external iliac chain, and right groin. Splenomegaly of 17.8cm was noted as well. These findings represent an indication for treatment based on the GELF criteria.   At this time I would recommend a rituximab based regimen, with a preference for Bendamustine/Rituximab. This regimen would consist of bendamustine 90 mg/m2 IV once per day on days 1 & 2 and rituximab 375 mg/m2 IV once on day 1.  The cycles are 28 days and the plan would be for 8 cycles. If this was not tolerated, could consider chlorambucil + rituximab vs. rituximab monotherapy.   GELF Criteria: 1 (splenomegaly). 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 If patient is unable to tolerate this treatment can consider rituximab monotherapy instead. Plan to start therapy the week of 03/19/2020  --patient is currently scheduled for surgical evaluation for excisional lymph node biopsy. I recommend proceeding with this to confirm the diagnosis --PET CT scan confirms stage III disease. Patient meets GELF criteria for treatment (splenomegaly). She fortunately does not have any B symptoms and her counts are stable  at this time, with some mild thrombocytopenia.  --we will schedule the patient for port placement and chemotherapy education.   #Symptom Management -- will prescribe zofran 72m PO q8H PRN and compazine 180mPO q6H PRN --will prescribe EMLA cream for patient's port site --recommend allopurinol 300 mg PO daily while on this regimen as a precaution for TLS --patient is not currently in any pain, no pain medication required.   Orders Placed This Encounter  Procedures   IR IMAGING GUIDED PORT INSERTION    Standing Status:   Future    Standing Expiration Date:   03/01/2021    Order Specific Question:   Reason for Exam (SYMPTOM  OR DIAGNOSIS REQUIRED)    Answer:   newly diagnosed NHL    Order Specific Question:   Preferred Imaging Location?    Answer:   [facility]  All questions were answered. The patient knows to call the clinic with any problems, questions or concerns.  A total of more than 40 minutes were spent on this encounter and over half of that time was spent on counseling and coordination of care as outlined above.   JoLedell PeoplesMD Department of Hematology/Oncology CoCrawfordt WeClear [facility] Surgicare Ltdhone: [phone]: [phone]: [email or web address]  03/01/2020 5:40 PM   Literature Support:  FlArtist Paiser JaAngeline SlimS, WoLyna PoserHertzberg M, Kwan YL, Milo D, Craig M, KoAubreyIsWellsvilleClElmerHaNevada  DM, Munteanu M, Aram Beecham JM. 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 Blood. 2014 May 8;123(19):2944-52.  --The overall response rates for BR and R-CHOP/R-CVP were 97% and 91%, respectively (P = .[id number]). These data indicate BR is noninferior to standard therapy with regard to clinical response with an acceptable safety profile.

## 2020-03-01 ENCOUNTER — Inpatient Hospital Stay: Payer: Medicare Other | Admitting: Hematology and Oncology

## 2020-03-01 ENCOUNTER — Inpatient Hospital Stay: Payer: Medicare Other

## 2020-03-01 ENCOUNTER — Encounter: Payer: Self-pay | Admitting: Hematology and Oncology

## 2020-03-01 ENCOUNTER — Other Ambulatory Visit: Payer: Self-pay

## 2020-03-01 VITALS — BP 150/65 | HR 83 | Temp 97.3°F | Resp 18 | Wt 139.4 lb

## 2020-03-01 DIAGNOSIS — Z7189 Other specified counseling: Secondary | ICD-10-CM | POA: Diagnosis not present

## 2020-03-01 DIAGNOSIS — Z809 Family history of malignant neoplasm, unspecified: Secondary | ICD-10-CM | POA: Diagnosis not present

## 2020-03-01 DIAGNOSIS — I10 Essential (primary) hypertension: Secondary | ICD-10-CM | POA: Diagnosis not present

## 2020-03-01 DIAGNOSIS — R197 Diarrhea, unspecified: Secondary | ICD-10-CM | POA: Diagnosis not present

## 2020-03-01 DIAGNOSIS — K760 Fatty (change of) liver, not elsewhere classified: Secondary | ICD-10-CM | POA: Diagnosis not present

## 2020-03-01 DIAGNOSIS — E119 Type 2 diabetes mellitus without complications: Secondary | ICD-10-CM | POA: Diagnosis not present

## 2020-03-01 DIAGNOSIS — K219 Gastro-esophageal reflux disease without esophagitis: Secondary | ICD-10-CM | POA: Diagnosis not present

## 2020-03-01 DIAGNOSIS — C8580 Other specified types of non-Hodgkin lymphoma, unspecified site: Secondary | ICD-10-CM

## 2020-03-01 DIAGNOSIS — R161 Splenomegaly, not elsewhere classified: Secondary | ICD-10-CM | POA: Diagnosis not present

## 2020-03-01 DIAGNOSIS — C8514 Unspecified B-cell lymphoma, lymph nodes of axilla and upper limb: Secondary | ICD-10-CM | POA: Diagnosis not present

## 2020-03-01 HISTORY — DX: Other specified types of non-hodgkin lymphoma, unspecified site: C85.80

## 2020-03-01 LAB — CBC WITH DIFFERENTIAL (CANCER CENTER ONLY)
Abs Immature Granulocytes: 0.02 10*3/uL (ref 0.00–0.07)
Basophils Absolute: 0 10*3/uL (ref 0.0–0.1)
Basophils Relative: 1 %
Eosinophils Absolute: 0.4 10*3/uL (ref 0.0–0.5)
Eosinophils Relative: 7 %
HCT: 37.7 % (ref 36.0–46.0)
Hemoglobin: 12.4 g/dL (ref 12.0–15.0)
Immature Granulocytes: 0 %
Lymphocytes Relative: 28 %
Lymphs Abs: 1.7 10*3/uL (ref 0.7–4.0)
MCH: 26.4 pg (ref 26.0–34.0)
MCHC: 32.9 g/dL (ref 30.0–36.0)
MCV: 80.4 fL (ref 80.0–100.0)
Monocytes Absolute: 0.6 10*3/uL (ref 0.1–1.0)
Monocytes Relative: 10 %
Neutro Abs: 3.2 10*3/uL (ref 1.7–7.7)
Neutrophils Relative %: 54 %
Platelet Count: 130 10*3/uL — ABNORMAL LOW (ref 150–400)
RBC: 4.69 MIL/uL (ref 3.87–5.11)
RDW: 14.6 % (ref 11.5–15.5)
WBC Count: 5.9 10*3/uL (ref 4.0–10.5)
nRBC: 0 % (ref 0.0–0.2)

## 2020-03-01 LAB — CMP (CANCER CENTER ONLY)
ALT: 10 U/L (ref 0–44)
AST: 20 U/L (ref 15–41)
Albumin: 4.1 g/dL (ref 3.5–5.0)
Alkaline Phosphatase: 77 U/L (ref 38–126)
Anion gap: 11 (ref 5–15)
BUN: 20 mg/dL (ref 8–23)
CO2: 24 mmol/L (ref 22–32)
Calcium: 9.6 mg/dL (ref 8.9–10.3)
Chloride: 106 mmol/L (ref 98–111)
Creatinine: 1.58 mg/dL — ABNORMAL HIGH (ref 0.44–1.00)
GFR, Est AFR Am: 36 mL/min — ABNORMAL LOW (ref >60)
GFR, Estimated: 31 mL/min — ABNORMAL LOW (ref >60)
Glucose, Bld: 128 mg/dL — ABNORMAL HIGH (ref 70–99)
Potassium: 4 mmol/L (ref 3.5–5.1)
Sodium: 141 mmol/L (ref 135–145)
Total Bilirubin: 0.4 mg/dL (ref 0.3–1.2)
Total Protein: 7.2 g/dL (ref 6.5–8.1)

## 2020-03-01 LAB — SAVE SMEAR(SSMR), FOR PROVIDER SLIDE REVIEW

## 2020-03-01 LAB — GLUCOSE, CAPILLARY: Glucose-Capillary: 98 mg/dL (ref 70–99)

## 2020-03-01 LAB — LACTATE DEHYDROGENASE: LDH: 216 U/L — ABNORMAL HIGH (ref 98–192)

## 2020-03-01 NOTE — Progress Notes (Addendum)
START OFF PATHWAY REGIMEN - Lymphoma and CLL   [id number]:Bendamustine + Rituximab (90/375) q28 Days:   A cycle is every 28 days:     Bendamustine      Rituximab-xxxx   **Always confirm dose/schedule in your pharmacy ordering system**  Patient Characteristics: Marginal Zone Lymphoma, Systemic, First Line, Symptomatic Disease Type: Marginal Zone Lymphoma Disease Type: Not Applicable Disease Type: Not Applicable Localized or Systemic Disease<= Systemic [place] Stage: III Line of Therapy: First Line Asymptomatic or Symptomatic<= Symptomatic Intent of Therapy: Non-Curative / Palliative Intent, Discussed with Patient

## 2020-03-05 ENCOUNTER — Other Ambulatory Visit (HOSPITAL_COMMUNITY): Payer: Medicare Other

## 2020-03-07 ENCOUNTER — Telehealth: Payer: Self-pay | Admitting: Hematology and Oncology

## 2020-03-07 ENCOUNTER — Other Ambulatory Visit: Payer: Self-pay | Admitting: Hematology and Oncology

## 2020-03-07 ENCOUNTER — Ambulatory Visit: Payer: Self-pay | Admitting: General Surgery

## 2020-03-07 DIAGNOSIS — C8590 Non-Hodgkin lymphoma, unspecified, unspecified site: Secondary | ICD-10-CM | POA: Diagnosis not present

## 2020-03-07 MED ORDER — ONDANSETRON HCL 8 MG PO TABS
8.0000 mg | ORAL_TABLET | Freq: Three times a day (TID) | ORAL | 1 refills | Status: DC | PRN
Start: 2020-03-07 — End: 2020-07-11

## 2020-03-07 MED ORDER — PROCHLORPERAZINE MALEATE 10 MG PO TABS
10.0000 mg | ORAL_TABLET | Freq: Four times a day (QID) | ORAL | 1 refills | Status: DC | PRN
Start: 2020-03-07 — End: 2020-07-11

## 2020-03-07 MED ORDER — ACYCLOVIR 400 MG PO TABS
400.0000 mg | ORAL_TABLET | Freq: Every day | ORAL | 3 refills | Status: DC
Start: 2020-03-07 — End: 2020-03-20

## 2020-03-07 MED ORDER — ALLOPURINOL 300 MG PO TABS
300.0000 mg | ORAL_TABLET | Freq: Every day | ORAL | 3 refills | Status: DC
Start: 2020-03-07 — End: 2021-01-02

## 2020-03-07 MED ORDER — LIDOCAINE-PRILOCAINE 2.5-2.5 % EX CREA
1.0000 | TOPICAL_CREAM | CUTANEOUS | 0 refills | Status: DC | PRN
Start: 2020-03-07 — End: 2020-04-12

## 2020-03-07 NOTE — Telephone Encounter (Signed)
Scheduled per los. Called and left msg. Mailed printout  °

## 2020-03-07 NOTE — H&P (Signed)
Jari Favre Appointment: 03/07/2020 10:20 AM Location: [facility] Patient #: [id number] DOB: June 24, 1941 Widowed / Language: Cleophus Molt / Race: White Female   History of Present Illness Elizabeth Neri E. Grandville Silos MD; 03/07/2020 10:49 AM) The patient is a 79 year old female who presents with lymphadenopathy. I was asked to see Rayna in consultation by Dr. Lorenso Courier in regards to lymphadenopathy. She developed multiple enlarged lymph nodes along her left axilla and left cervical chain. Percutaneous biopsy was suspicious for non-Hodgkin's B-cell lymphoma that this was not conclusive. I was asked to see her for lymph node biopsy in order to obtain appropriate tissue for detailed diagnosis.   Allergies Darden Palmer, [place]; 03/07/2020 10:37 AM) No Known Drug Allergies  [03/07/2020]:  Medication History Darden Palmer, RMA; 03/07/2020 10:39 AM) Pantoprazole Sodium (40MG  Tablet DR, Oral) Active. Nystatin (100000 UNIT/ML Suspension, Mouth/Throat) Active. Albuterol Sulfate HFA (108 (90 Base)MCG/ACT Aerosol Soln, Inhalation) Active. amLODIPine Besylate (10MG  Tablet, Oral) Active. Levothyroxine Sodium (75MCG Tablet, Oral) Active. Triamcinolone Acetonide (0.1% Cream, External) Active. Tylenol 8 Hour (650MG  Tablet ER, Oral) Active.  Vitals Lattie Haw [place] RMA; 03/07/2020 10:39 AM) 03/07/2020 10:39 AM Weight: 141.13 lb Height: 62in Body Surface Area: 1.65 m Body Mass Index: 25.81 kg/m  Temp.: 64F  Pulse: 92 (Regular)  BP: 136/70(Sitting, Left Arm, Standard)       Physical Exam Elizabeth Neri E. Grandville Silos MD; 03/07/2020 10:50 AM) General Mental Status-Alert. General Appearance-Consistent with stated age. Hydration-Well hydrated. Voice-Normal.  Head and Neck Head-normocephalic, atraumatic with no lesions or palpable masses. Trachea-midline. Thyroid Gland Characteristics - normal size and consistency.  Eye Eyeball - Bilateral-Extraocular movements  intact. Sclera/Conjunctiva - Bilateral-No scleral icterus.  Chest and Lung Exam Chest and lung exam reveals -quiet, even and easy respiratory effort with no use of accessory muscles and on auscultation, normal breath sounds, no adventitious sounds and normal vocal resonance. Inspection Chest Wall - Normal. Back - normal.  Cardiovascular Cardiovascular examination reveals -normal heart sounds, regular rate and rhythm with no murmurs and normal pedal pulses bilaterally.  Abdomen Inspection Inspection of the abdomen reveals - No Hernias. Palpation/Percussion Palpation and Percussion of the abdomen reveal - Soft, Non Tender, No Rebound tenderness, No Rigidity (guarding) and No hepatosplenomegaly. Auscultation Auscultation of the abdomen reveals - Bowel sounds normal.  Neurologic Neurologic evaluation reveals -alert and oriented x 3 with no impairment of recent or remote memory. Mental Status-Normal.  Musculoskeletal Global Assessment -Note: no gross deformities.  Normal Exam - Left-Upper Extremity Strength Normal and Lower Extremity Strength Normal. Normal Exam - Right-Upper Extremity Strength Normal and Lower Extremity Strength Normal.  Lymphatic General Lymphatics -Note: 2.5 cm deep left axillary node with overlying scar from biopsy.  Head & Neck  General Head & Neck Lymphatics: Bilateral - Description - Normal. Posterior Cervical Nodes: Left - Size - 1.0 cm. Consistency - Firm. Axillary  General Axillary Region: Bilateral - Description - Normal. Tenderness - Non Tender. Femoral & Inguinal  Generalized Femoral & Inguinal Lymphatics: Bilateral - Description - No Generalized lymphadenopathy.    Assessment & Plan Elizabeth Neri E. 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 I feel the safest node biopsy is left posterior cervical near her hairline. It is 1 cm in size and taking this lymph node will have  the fewest postoperative complications including lymphedema. I discussed the procedure, risks, and benefits with her. We will expedite quick scheduling so she may undergo chemotherapy as soon as possible. I also spoke with her daughter in I discussed the expected postoperative  course. She is undergoing placement of Port-A-Cath on 03/14/20.

## 2020-03-07 NOTE — H&P (View-Only) (Signed)
Jari Favre Appointment: 03/07/2020 10:20 AM Location: [facility] Patient #: [id number] DOB: 1941/08/15 Widowed / Language: Cleophus Molt / Race: White Female   History of Present Illness Lavone Neri E. Grandville Silos MD; 03/07/2020 10:49 AM) The patient is a 79 year old female who presents with lymphadenopathy. I was asked to see Zakyra in consultation by Dr. Lorenso Courier in regards to lymphadenopathy. She developed multiple enlarged lymph nodes along her left axilla and left cervical chain. Percutaneous biopsy was suspicious for non-Hodgkin's B-cell lymphoma that this was not conclusive. I was asked to see her for lymph node biopsy in order to obtain appropriate tissue for detailed diagnosis.   Allergies Darden Palmer, [place]; 03/07/2020 10:37 AM) No Known Drug Allergies  [03/07/2020]:  Medication History Darden Palmer, RMA; 03/07/2020 10:39 AM) Pantoprazole Sodium (40MG  Tablet DR, Oral) Active. Nystatin (100000 UNIT/ML Suspension, Mouth/Throat) Active. Albuterol Sulfate HFA (108 (90 Base)MCG/ACT Aerosol Soln, Inhalation) Active. amLODIPine Besylate (10MG  Tablet, Oral) Active. Levothyroxine Sodium (75MCG Tablet, Oral) Active. Triamcinolone Acetonide (0.1% Cream, External) Active. Tylenol 8 Hour (650MG  Tablet ER, Oral) Active.  Vitals Lattie Haw Pur[place] RMA; 03/07/2020 10:39 AM) 03/07/2020 10:39 AM Weight: 141.13 lb Height: 62in Body Surface Area: 1.65 m Body Mass Index: 25.81 kg/m  Temp.: 14F  Pulse: 92 (Regular)  BP: 136/70(Sitting, Left Arm, Standard)       Physical Exam Lavone Neri E. Grandville Silos MD; 03/07/2020 10:50 AM) General Mental Status-Alert. General Appearance-Consistent with stated age. Hydration-Well hydrated. Voice-Normal.  Head and Neck Head-normocephalic, atraumatic with no lesions or palpable masses. Trachea-midline. Thyroid Gland Characteristics - normal size and consistency.  Eye Eyeball - Bilateral-Extraocular movements  intact. Sclera/Conjunctiva - Bilateral-No scleral icterus.  Chest and Lung Exam Chest and lung exam reveals -quiet, even and easy respiratory effort with no use of accessory muscles and on auscultation, normal breath sounds, no adventitious sounds and normal vocal resonance. Inspection Chest Wall - Normal. Back - normal.  Cardiovascular Cardiovascular examination reveals -normal heart sounds, regular rate and rhythm with no murmurs and normal pedal pulses bilaterally.  Abdomen Inspection Inspection of the abdomen reveals - No Hernias. Palpation/Percussion Palpation and Percussion of the abdomen reveal - Soft, Non Tender, No Rebound tenderness, No Rigidity (guarding) and No hepatosplenomegaly. Auscultation Auscultation of the abdomen reveals - Bowel sounds normal.  Neurologic Neurologic evaluation reveals -alert and oriented x 3 with no impairment of recent or remote memory. Mental Status-Normal.  Musculoskeletal Global Assessment -Note: no gross deformities.  Normal Exam - Left-Upper Extremity Strength Normal and Lower Extremity Strength Normal. Normal Exam - Right-Upper Extremity Strength Normal and Lower Extremity Strength Normal.  Lymphatic General Lymphatics -Note: 2.5 cm deep left axillary node with overlying scar from biopsy.  Head & Neck  General Head & Neck Lymphatics: Bilateral - Description - Normal. Posterior Cervical Nodes: Left - Size - 1.0 cm. Consistency - Firm. Axillary  General Axillary Region: Bilateral - Description - Normal. Tenderness - Non Tender. Femoral & Inguinal  Generalized Femoral & Inguinal Lymphatics: Bilateral - Description - No Generalized lymphadenopathy.    Assessment & Plan Lavone Neri E. 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 I feel the safest node biopsy is left posterior cervical near her hairline. It is 1 cm in size and taking this lymph node will have  the fewest postoperative complications including lymphedema. I discussed the procedure, risks, and benefits with her. We will expedite quick scheduling so she may undergo chemotherapy as soon as possible. I also spoke with her daughter in I discussed the expected postoperative  course. She is undergoing placement of Port-A-Cath on 03/14/20.

## 2020-03-11 ENCOUNTER — Other Ambulatory Visit: Payer: Self-pay | Admitting: Radiology

## 2020-03-12 ENCOUNTER — Other Ambulatory Visit: Payer: Self-pay

## 2020-03-12 ENCOUNTER — Inpatient Hospital Stay: Payer: Medicare Other

## 2020-03-13 ENCOUNTER — Other Ambulatory Visit: Payer: Self-pay

## 2020-03-13 ENCOUNTER — Ambulatory Visit (HOSPITAL_COMMUNITY)
Admission: RE | Admit: 2020-03-13 | Discharge: 2020-03-13 | Disposition: A | Discharge status: Home / Self Care | Payer: Medicare Other | Source: Ambulatory Visit | Attending: Hematology and Oncology | Admitting: Hematology and Oncology

## 2020-03-13 ENCOUNTER — Encounter (HOSPITAL_COMMUNITY): Payer: Self-pay

## 2020-03-13 DIAGNOSIS — Z5111 Encounter for antineoplastic chemotherapy: Secondary | ICD-10-CM | POA: Diagnosis not present

## 2020-03-13 DIAGNOSIS — E119 Type 2 diabetes mellitus without complications: Secondary | ICD-10-CM | POA: Diagnosis not present

## 2020-03-13 DIAGNOSIS — Z79899 Other long term (current) drug therapy: Secondary | ICD-10-CM | POA: Diagnosis not present

## 2020-03-13 DIAGNOSIS — K219 Gastro-esophageal reflux disease without esophagitis: Secondary | ICD-10-CM | POA: Insufficient documentation

## 2020-03-13 DIAGNOSIS — J45909 Unspecified asthma, uncomplicated: Secondary | ICD-10-CM | POA: Insufficient documentation

## 2020-03-13 DIAGNOSIS — Z885 Allergy status to narcotic agent status: Secondary | ICD-10-CM | POA: Diagnosis not present

## 2020-03-13 DIAGNOSIS — Z452 Encounter for adjustment and management of vascular access device: Secondary | ICD-10-CM | POA: Diagnosis not present

## 2020-03-13 DIAGNOSIS — Z79890 Hormone replacement therapy (postmenopausal): Secondary | ICD-10-CM | POA: Insufficient documentation

## 2020-03-13 DIAGNOSIS — M8580 Other specified disorders of bone density and structure, unspecified site: Secondary | ICD-10-CM | POA: Diagnosis not present

## 2020-03-13 DIAGNOSIS — E785 Hyperlipidemia, unspecified: Secondary | ICD-10-CM | POA: Insufficient documentation

## 2020-03-13 DIAGNOSIS — C8590 Non-Hodgkin lymphoma, unspecified, unspecified site: Secondary | ICD-10-CM | POA: Diagnosis not present

## 2020-03-13 DIAGNOSIS — C8580 Other specified types of non-Hodgkin lymphoma, unspecified site: Secondary | ICD-10-CM | POA: Diagnosis not present

## 2020-03-13 DIAGNOSIS — I10 Essential (primary) hypertension: Secondary | ICD-10-CM | POA: Diagnosis not present

## 2020-03-13 DIAGNOSIS — M069 Rheumatoid arthritis, unspecified: Secondary | ICD-10-CM | POA: Diagnosis not present

## 2020-03-13 HISTORY — PX: IR IMAGING GUIDED PORT INSERTION: IMG5740

## 2020-03-13 LAB — CBC WITH DIFFERENTIAL/PLATELET
Abs Immature Granulocytes: 0.03 10*3/uL (ref 0.00–0.07)
Basophils Absolute: 0 10*3/uL (ref 0.0–0.1)
Basophils Relative: 1 %
Eosinophils Absolute: 0.3 10*3/uL (ref 0.0–0.5)
Eosinophils Relative: 5 %
HCT: 38.4 % (ref 36.0–46.0)
Hemoglobin: 12.7 g/dL (ref 12.0–15.0)
Immature Granulocytes: 1 %
Lymphocytes Relative: 21 %
Lymphs Abs: 1.2 10*3/uL (ref 0.7–4.0)
MCH: 27.3 pg (ref 26.0–34.0)
MCHC: 33.1 g/dL (ref 30.0–36.0)
MCV: 82.4 fL (ref 80.0–100.0)
Monocytes Absolute: 0.6 10*3/uL (ref 0.1–1.0)
Monocytes Relative: 10 %
Neutro Abs: 3.7 10*3/uL (ref 1.7–7.7)
Neutrophils Relative %: 62 %
Platelets: 138 10*3/uL — ABNORMAL LOW (ref 150–400)
RBC: 4.66 MIL/uL (ref 3.87–5.11)
RDW: 14.6 % (ref 11.5–15.5)
WBC: 5.9 10*3/uL (ref 4.0–10.5)
nRBC: 0 % (ref 0.0–0.2)

## 2020-03-13 LAB — BASIC METABOLIC PANEL
Anion gap: 14 (ref 5–15)
BUN: 22 mg/dL (ref 8–23)
CO2: 24 mmol/L (ref 22–32)
Calcium: 9.4 mg/dL (ref 8.9–10.3)
Chloride: 101 mmol/L (ref 98–111)
Creatinine, Ser: 1.58 mg/dL — ABNORMAL HIGH (ref 0.44–1.00)
GFR calc Af Amer: 36 mL/min — ABNORMAL LOW (ref >60)
GFR calc non Af Amer: 31 mL/min — ABNORMAL LOW (ref >60)
Glucose, Bld: 123 mg/dL — ABNORMAL HIGH (ref 70–99)
Potassium: 3.8 mmol/L (ref 3.5–5.1)
Sodium: 139 mmol/L (ref 135–145)

## 2020-03-13 LAB — PROTIME-INR
INR: 1 (ref 0.8–1.2)
Prothrombin Time: 12.9 seconds (ref 11.4–15.2)

## 2020-03-13 IMAGING — US IR IMAGING GUIDED PORT INSERTION
2 series · 2 of 2 positions shown · non-contrast
Comparison: none

CLINICAL DATA: Lymphoma, access chemotherapy

[Series 1: (id) · 1 of 1 slices shown]
[im 1/1]
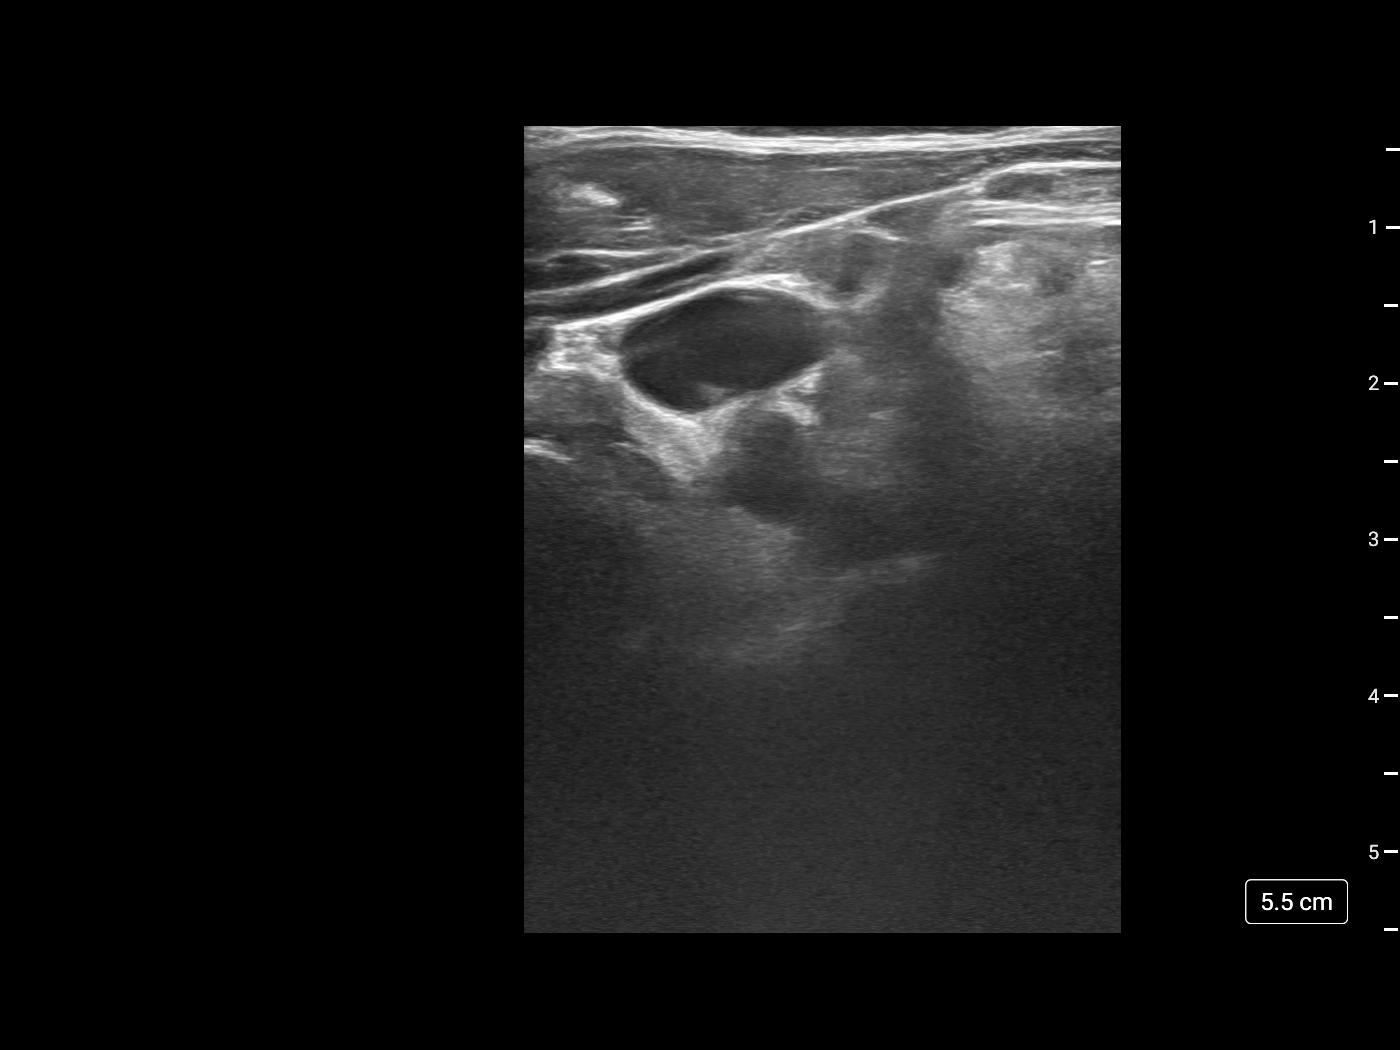

[Series 300: ir imaging guided port insertion · 1 of 1 slices shown]
[im 1/1]
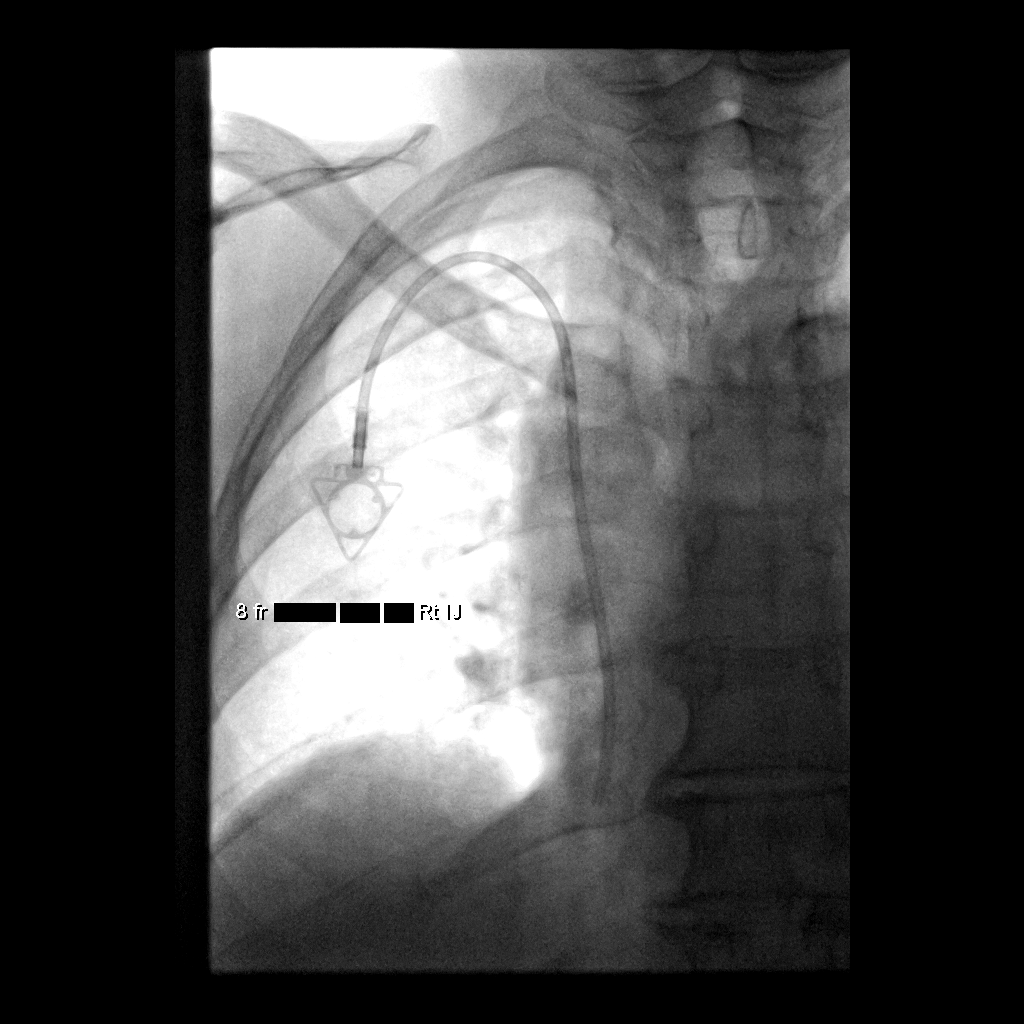

[2 of 2 positions shown; findings below may reference images not displayed]

EXAM:
RIGHT INTERNAL JUGULAR SINGLE LUMEN POWER PORT CATHETER INSERTION

Date:  [DATE] [DATE] [DATE]

Radiologist:  JOSSUE

Guidance:  Ultrasound and fluoroscopic

MEDICATIONS:
Ancef 2 g; The antibiotic was administered within an appropriate
time interval prior to skin puncture.

ANESTHESIA/SEDATION:
Versed 2.0 mg IV; Fentanyl 100 mcg IV;

Moderate Sedation Time:  23 minutes

The patient was continuously monitored during the procedure by the
interventional radiology nurse under my direct supervision.

FLUOROSCOPY TIME:  0 minutes, 30 seconds (4 mGy)

COMPLICATIONS:
None immediate.

CONTRAST:  None.

PROCEDURE:
Informed consent was obtained from the patient following explanation
of the procedure, risks, benefits and alternatives. The patient
understands, agrees and consents for the procedure. All questions
were addressed. A time out was performed.

Maximal barrier sterile technique utilized including caps, mask,
sterile gowns, sterile gloves, large sterile drape, hand hygiene,
and 2% chlorhexidine scrub.

Under sterile conditions and local anesthesia, right internal
jugular micropuncture venous access was performed. Access was
performed with ultrasound. Images were obtained for documentation of
the patent right internal jugular vein. A guide wire was inserted
followed by a transitional dilator. This allowed insertion of a
guide wire and catheter into the IVC. Measurements were obtained
from the SVC / RA junction back to the right IJ venotomy site. In
the right infraclavicular chest, a subcutaneous pocket was created
over the second anterior rib. This was done under sterile conditions
and local anesthesia. 1% lidocaine with epinephrine was utilized for
this. A 2.5 cm incision was made in the skin. Blunt dissection was
performed to create a subcutaneous pocket over the right pectoralis
major muscle. The pocket was flushed with saline vigorously. There
was adequate hemostasis. The port catheter was assembled and checked
for leakage. The port catheter was secured in the pocket with two
retention sutures. The tubing was tunneled subcutaneously to the
right venotomy site and inserted into the SVC/RA junction through a
valved peel-away sheath. Position was confirmed with fluoroscopy.
Images were obtained for documentation. The patient tolerated the
procedure well. No immediate complications. Incisions were closed in
a two layer fashion with 4 - 0 Vicryl suture. Dermabond was applied
to the skin. The port catheter was accessed, blood was aspirated
followed by saline and heparin flushes. Needle was removed. A dry
sterile dressing was applied.
IMPRESSION: Ultrasound and fluoroscopically guided right internal jugular single
lumen power port catheter insertion. Tip in the SVC/RA junction.
Catheter ready for use.

## 2020-03-13 MED ORDER — CEFAZOLIN SODIUM-DEXTROSE 2-4 GM/100ML-% IV SOLN
INTRAVENOUS | Status: AC
  Administered 2020-03-13: 2 g via INTRAVENOUS
  Filled 2020-03-13: qty 100

## 2020-03-13 MED ORDER — MIDAZOLAM HCL 2 MG/2ML IJ SOLN
INTRAMUSCULAR | Status: AC | PRN
  Administered 2020-03-13 (×2): 1 mg via INTRAVENOUS

## 2020-03-13 MED ORDER — HEPARIN SOD (PORK) LOCK FLUSH 100 UNIT/ML IV SOLN
INTRAVENOUS | Status: AC
  Filled 2020-03-13: qty 5

## 2020-03-13 MED ORDER — MIDAZOLAM HCL 2 MG/2ML IJ SOLN
INTRAMUSCULAR | Status: AC
  Filled 2020-03-13: qty 2

## 2020-03-13 MED ORDER — CEFAZOLIN SODIUM-DEXTROSE 2-4 GM/100ML-% IV SOLN: 2.0000 g | INTRAVENOUS | Status: AC

## 2020-03-13 MED ORDER — LIDOCAINE-EPINEPHRINE 1 %-1:100000 IJ SOLN
INTRAMUSCULAR | Status: AC | PRN
  Administered 2020-03-13: 10 mL via INTRADERMAL

## 2020-03-13 MED ORDER — FENTANYL CITRATE (PF) 100 MCG/2ML IJ SOLN
INTRAMUSCULAR | Status: AC | PRN
  Administered 2020-03-13 (×2): 50 ug via INTRAVENOUS

## 2020-03-13 MED ORDER — HEPARIN SOD (PORK) LOCK FLUSH 100 UNIT/ML IV SOLN
INTRAVENOUS | Status: AC | PRN
  Administered 2020-03-13: 500 [IU] via INTRAVENOUS

## 2020-03-13 MED ORDER — SODIUM CHLORIDE 0.9 % IV SOLN: INTRAVENOUS | Status: DC

## 2020-03-13 MED ORDER — FENTANYL CITRATE (PF) 100 MCG/2ML IJ SOLN
INTRAMUSCULAR | Status: AC
  Filled 2020-03-13: qty 2

## 2020-03-13 MED ORDER — LIDOCAINE-EPINEPHRINE 1 %-1:100000 IJ SOLN
INTRAMUSCULAR | Status: AC
  Filled 2020-03-13: qty 1

## 2020-03-13 NOTE — Discharge Instructions (Signed)
NO EMLA Cream for 2 weeks.  For questions/concens about port; may call Interventional Radiology on call staff at [place] Insertion, Care After This sheet gives you information about how to care for yourself after your procedure. Your health care provider may also give you more specific instructions. If you have problems or questions, contact your health care provider. What can I expect after the procedure? After the procedure, it is common to have:  Discomfort at the port insertion site.  Bruising on the skin over the port. This should improve over 3-4 days. Follow these instructions at home: [name] care  After your port is placed, you will get a manufacturer's information card. The card has information about your port. Keep this card with you at all times.  Take care of the port as told by your health care provider. Ask your health care provider if you or a family member can get training for taking care of the port at home. A home health care nurse may also take care of the port.  Make sure to remember what type of port you have. Incision care      Follow instructions from your health care provider about how to take care of your port insertion site. Make sure you: ? Wash your hands with soap and water before and after you change your bandage (dressing). If soap and water are not available, use hand sanitizer. ? Change your dressing as told by your health care provider. ? Leave stitches (sutures), skin glue, or adhesive strips in place. These skin closures may need to stay in place for 2 weeks or longer. If adhesive strip edges start to loosen and curl up, you may trim the loose edges. Do not remove adhesive strips completely unless your health care provider tells you to do that.  Check your port insertion site every day for signs of infection. Check for: ? Redness, swelling, or pain. ? Fluid or blood. ? Warmth. ? Pus or a bad smell. Activity  Return to your  normal activities as told by your health care provider. Ask your health care provider what activities are safe for you.  Do not lift anything that is heavier than 10 lb (4.5 kg), or the limit that you are told, until your health care provider says that it is safe. General instructions  Take over-the-counter and prescription medicines only as told by your health care provider.  Do not take baths, swim, or use a hot tub until your health care provider approves. Ask your health care provider if you may take showers. You may only be allowed to take sponge baths.  Do not drive for 24 hours if you were given a sedative during your procedure.  Wear a medical alert bracelet in case of an emergency. This will tell any health care providers that you have a port.  Keep all follow-up visits as told by your health care provider. This is important. Contact a health care provider if:  You cannot flush your port with saline as directed, or you cannot draw blood from the port.  You have a fever or chills.  You have redness, swelling, or pain around your port insertion site.  You have fluid or blood coming from your port insertion site.  Your port insertion site feels warm to the touch.  You have pus or a bad smell coming from the port insertion site. Get help right away if:  You have chest pain or shortness of breath.  You  have bleeding from your port that you cannot control. Summary  Take care of the port as told by your health care provider. Keep the manufacturer's information card with you at all times.  Change your dressing as told by your health care provider.  Contact a health care provider if you have a fever or chills or if you have redness, swelling, or pain around your port insertion site.  Keep all follow-up visits as told by your health care provider. This information is not intended to replace advice given to you by your health care provider. Make sure you discuss any questions  you have with your health care provider. Document Revised: 03/30/2018 Document Reviewed: 03/30/2018 Elsevier Patient Education  [place]. Moderate Conscious Sedation, Adult, Care After These instructions provide you with information about caring for yourself after your procedure. Your health care provider may also give you more specific instructions. Your treatment has been planned according to current medical practices, but problems sometimes occur. Call your health care provider if you have any problems or questions after your procedure. What can I expect after the procedure? After your procedure, it is common:  To feel sleepy for several hours.  To feel clumsy and have poor balance for several hours.  To have poor judgment for several hours.  To vomit if you eat too soon. Follow these instructions at home: For at least 24 hours after the procedure:   Do not: ? Participate in activities where you could fall or become injured. ? Drive. ? Use heavy machinery. ? Drink alcohol. ? Take sleeping pills or medicines that cause drowsiness. ? Make important decisions or sign legal documents. ? Take care of children on your own.  Rest. Eating and drinking  Follow the diet recommended by your health care provider.  If you vomit: ? Drink water, juice, or soup when you can drink without vomiting. ? Make sure you have little or no nausea before eating solid foods. General instructions  Have a responsible adult stay with you until you are awake and alert.  Take over-the-counter and prescription medicines only as told by your health care provider.  If you smoke, do not smoke without supervision.  Keep all follow-up visits as told by your health care provider. This is important. Contact a health care provider if:  You keep feeling nauseous or you keep vomiting.  You feel light-headed.  You develop a rash.  You have a fever. Get help right away if:  You have trouble  breathing. This information is not intended to replace advice given to you by your health care provider. Make sure you discuss any questions you have with your health care provider. Document Revised: 08/14/2017 Document Reviewed: 12/22/2015 Elsevier Patient Education  2020 [employer].

## 2020-03-13 NOTE — H&P (Signed)
Referring Physician(s): Dorsey,John T IV  Supervising Physician: Daryll Brod  Patient Status:  [facility]  Chief Complaint:  "I'm getting a port a cath"  Subjective: Patient familiar to IR service from left axillary lymph node biopsy on 02/07/2020.  She has a history of newly diagnosed non-Hodgkin's lymphoma and presents again today for Port-A-Cath placement for chemotherapy.  She currently denies fever, headache, chest pain, dyspnea, cough, nausea, vomiting or bleeding.  She does have some mild intermittent abdominal and back discomfort.  Past Medical History:  Diagnosis Date  . Asthma   . Bloating 04/26/2019  . 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 DIABETES MELLITUS, TYPE II 11/09/2007   diet control  . Diverticulosis 03/2011  . GERD (gastroesophageal reflux disease)   . GI bleed   . History of rheumatoid arthritis    during 30's, was treated.  . Hyperlipidemia   . Hypertension   . Osteopenia 2017   Last  bone density 05/04/2017: -2.4  . PONV (postoperative nausea and vomiting)   . Stress incontinence    Past Surgical History:  Procedure Laterality Date  . ABDOMINAL HYSTERECTOMY    . CARDIAC CATHETERIZATION     X 2, last one in 1998  . CHOLECYSTECTOMY N/A 04/13/2017   Procedure: LAPAROSCOPIC CHOLECYSTECTOMY;  Surgeon: Aviva Signs, MD;  Location: [facility];  Service: General;  Laterality: N/A;  . COLONOSCOPY    . COLONOSCOPY  May 2012   Dr. Olevia Perches: mild diverticulosis, otherwise normal.   . ESOPHAGOGASTRODUODENOSCOPY N/A 01/28/2015   Dr. Gala Romney: reflux esophagitis, Schatzki's ring not manipulated due to recent bleeding  . ESOPHAGOGASTRODUODENOSCOPY N/A 03/30/2015   Dr. Gala Romney: Schatzki's ring s/p Venia Minks dilation, previously noted esophageal ulcer completely healed  . MALONEY DILATION N/A 03/30/2015   Procedure: Venia Minks DILATION;  Surgeon: Daneil Dolin, MD;  Location: [facility]ITE;  Service: Endoscopy;  Laterality: N/A;        Allergies: Codeine phosphate  Medications: Prior to Admission medications   Medication Sig Start Date End Date Taking? Authorizing Provider  acetaminophen (TYLENOL) 325 MG tablet Take 650 mg by mouth every 6 (six) hours as needed for moderate pain.    Yes [provider]  amLODipine (NORVASC) 10 MG tablet Take 1 tablet (10 mg total) by mouth daily. 12/29/19  Yes Kuneff, Renee A, DO  Cholecalciferol (VITAMIN D3) 25 MCG (1000 UT) CAPS Take 1,000 Units by mouth daily.    Yes [provider]  levothyroxine (SYNTHROID) 75 MCG tablet Take 1 tablet (75 mcg total) by mouth daily. 06/09/19  Yes Kuneff, Renee A, DO  vitamin B-12 (CYANOCOBALAMIN) 1000 MCG tablet Take 1,000 mcg by mouth daily.   Yes [provider]  acyclovir (ZOVIRAX) 400 MG tablet Take 1 tablet (400 mg total) by mouth daily. Patient not taking: Reported on 03/08/2020 03/07/20   Orson Slick, MD  albuterol (VENTOLIN HFA) 108 (90 Base) MCG/ACT inhaler Inhale 2 puffs into the lungs every 6 (six) hours as needed for wheezing. 01/03/20 01/02/21  Howard Pouch A, DO  allopurinol (ZYLOPRIM) 300 MG tablet Take 1 tablet (300 mg total) by mouth daily. Patient not taking: Reported on 03/08/2020 03/07/20   Orson Slick, MD  dicyclomine (BENTYL) 10 MG capsule Take 1 capsule (10 mg total) by mouth 4 (four) times daily -  before meals and at bedtime. Needs appt for further refills. 11/25/19   Kuneff, Renee A, DO  lidocaine-prilocaine (  EMLA) cream Apply 1 application topically as needed. 03/07/20   Orson Slick, MD  nystatin (MYCOSTATIN) 100000 UNIT/ML suspension Take 5 mLs (500,000 Units total) by mouth 4 (four) times daily. 5 ml swish for 30 seconds and then spit out, QID, do not swallow. Patient not taking: Reported on 03/08/2020 01/20/20   Howard Pouch A, DO  ondansetron (ZOFRAN) 8 MG tablet Take 1 tablet (8 mg total) by mouth every 8 (eight) hours as needed for nausea or vomiting. 03/07/20   Orson Slick,  MD  pantoprazole (PROTONIX) 40 MG tablet TAKE 1 TABLET BY MOUTH DAILY BEFORE BREAKFAST Patient not taking: Reported on 03/08/2020 02/24/20   Carlis Stable, NP  prochlorperazine (COMPAZINE) 10 MG tablet Take 1 tablet (10 mg total) by mouth every 6 (six) hours as needed for nausea or vomiting. 03/07/20   Orson Slick, MD  triamcinolone cream (KENALOG) 0.1 % Apply 1 application topically 2 (two) times daily. For 2-4 weeks as needed Patient not taking: Reported on 03/08/2020 04/11/19   Leamon Arnt, MD     Vital Signs: BP (!) 157/69   Pulse 76   Temp 98.4 F (36.9 C) (Oral)   Resp 16   Ht 5\' 2"  (1.575 m)   Wt 140 lb (63.5 kg)   SpO2 100%   BMI 25.61 kg/m   Physical Exam awake, alert.  Chest clear to auscultation bilaterally.  Heart with regular rate and rhythm.  Abdomen soft, positive bowel sounds, some mild left-sided abdominal tenderness to palpation.  No significant lower extremity edema.  Imaging: No results found.  Labs:  CBC: Recent Labs    01/20/20 1351 02/16/20 0935 03/01/20 0835 03/13/20 1013  WBC 7.8 7.6 5.9 5.9  HGB 14.3 12.6 12.4 12.7  HCT 43.4 37.7 37.7 38.4  PLT 165 177 130* 138*    COAGS: Recent Labs    03/13/20 1013  INR 1.0    BMP: Recent Labs    12/29/19 1021 02/16/20 0935 03/01/20 0835 03/13/20 1013  NA 140 139 141 139  K 4.4 3.4* 4.0 3.8  CL 105 103 106 101  CO2 27 21* 24 24  GLUCOSE 129* 118* 128* 123*  BUN 23 18 20 22   CALCIUM 10.0  9.6 9.4 9.6 9.4  CREATININE 1.49* 1.75* 1.58* 1.58*  GFRNONAA  --  28* 31* 31*  GFRAA  --  32* 36* 36*    LIVER FUNCTION TESTS: Recent Labs    04/20/19 1409 12/29/19 1021 02/16/20 0935 03/01/20 0835  BILITOT 0.4 0.4 0.5 0.4  AST 16 18 23 20   ALT 12 10 15 10   ALKPHOS 62 62 72 77  PROT 6.9 6.9 7.3 7.2  ALBUMIN 4.5 4.5 4.2 4.1    Assessment and Plan: Pt with history of newly diagnosed non-Hodgkin's lymphoma; presents today for Port-A-Cath placement for chemotherapy.Risks and benefits of  image guided port-a-catheter placement was discussed with the patient including, but not limited to bleeding, infection, pneumothorax, or fibrin sheath development and need for additional procedures.  All of the patient's questions were answered, patient is agreeable to proceed. Consent signed and in chart.      Electronically Signed: D. Rowe Robert, PA-C 03/13/2020, 11:21 AM   I spent a total of 25 minutes at the the patient's bedside AND on the patient's hospital floor or unit, greater than 50% of which was counseling/coordinating care for Port-A-Cath placement

## 2020-03-13 NOTE — Procedures (Signed)
Interventional Radiology Procedure Note  Procedure: RT IJ POWER PORT  Complications: None  Estimated Blood Loss: MIN  Findings: TIP SVCRA       

## 2020-03-15 ENCOUNTER — Other Ambulatory Visit: Payer: Self-pay

## 2020-03-15 ENCOUNTER — Telehealth: Payer: Self-pay | Admitting: Hematology and Oncology

## 2020-03-15 ENCOUNTER — Encounter (HOSPITAL_COMMUNITY): Payer: Self-pay | Admitting: General Surgery

## 2020-03-15 NOTE — Progress Notes (Signed)
Spoke with pt for pre-op call. Pt denies cardiac history. Pt is a type 2 diabetic. No longer on medications and states she does not have to check her blood sugar anymore. Last A1C was 5.7 on 12/28/19.   Pt scheduled for Covid test tomorrow 03/16/20. She will come by here after the test and pick up a Gatorade G2 to drink the morning of surgery for the ERAS protocol. Pt is not to eat food after midnight but may have clear liquids until 6:10 AM, reviewed the clear liquid list with pt. Pt is to drink the G2 just prior to 6:10 AM, that will be the last liquids that she will have prior to surgery.

## 2020-03-15 NOTE — Telephone Encounter (Signed)
Called pt per 7/1 sch message - unable to reach pt - left message for patient to call back for reschedule

## 2020-03-16 ENCOUNTER — Other Ambulatory Visit (HOSPITAL_COMMUNITY)
Admission: RE | Admit: 2020-03-16 | Discharge: 2020-03-16 | Disposition: A | Discharge status: Home / Self Care | Payer: Medicare Other | Source: Ambulatory Visit | Attending: General Surgery | Admitting: General Surgery

## 2020-03-16 ENCOUNTER — Telehealth: Payer: Self-pay | Admitting: Hematology and Oncology

## 2020-03-16 DIAGNOSIS — Z01812 Encounter for preprocedural laboratory examination: Secondary | ICD-10-CM | POA: Diagnosis not present

## 2020-03-16 DIAGNOSIS — Z20822 Contact with and (suspected) exposure to covid-19: Secondary | ICD-10-CM | POA: Insufficient documentation

## 2020-03-16 LAB — SARS CORONAVIRUS 2 (TAT 6-24 HRS): SARS Coronavirus 2: NEGATIVE

## 2020-03-16 NOTE — Telephone Encounter (Signed)
R./s appt per 7/2 sch message - unable to reach pt . Left message with apt date and time.

## 2020-03-20 ENCOUNTER — Other Ambulatory Visit: Payer: Self-pay

## 2020-03-20 ENCOUNTER — Inpatient Hospital Stay: Payer: Medicare Other

## 2020-03-20 ENCOUNTER — Encounter (HOSPITAL_COMMUNITY)
Admission: RE | Disposition: A | Discharge status: Home / Self Care | Payer: Self-pay | Source: Home / Self Care | Attending: General Surgery

## 2020-03-20 ENCOUNTER — Ambulatory Visit (HOSPITAL_COMMUNITY)
Admission: RE | Admit: 2020-03-20 | Discharge: 2020-03-20 | Disposition: A | Discharge status: Home / Self Care | Payer: Medicare Other | Attending: General Surgery | Admitting: General Surgery

## 2020-03-20 ENCOUNTER — Other Ambulatory Visit: Payer: Medicare Other

## 2020-03-20 ENCOUNTER — Ambulatory Visit (HOSPITAL_COMMUNITY): Payer: Medicare Other

## 2020-03-20 DIAGNOSIS — Z8673 Personal history of transient ischemic attack (TIA), and cerebral infarction without residual deficits: Secondary | ICD-10-CM | POA: Diagnosis not present

## 2020-03-20 DIAGNOSIS — I10 Essential (primary) hypertension: Secondary | ICD-10-CM | POA: Insufficient documentation

## 2020-03-20 DIAGNOSIS — J45909 Unspecified asthma, uncomplicated: Secondary | ICD-10-CM | POA: Diagnosis not present

## 2020-03-20 DIAGNOSIS — R590 Localized enlarged lymph nodes: Secondary | ICD-10-CM | POA: Diagnosis not present

## 2020-03-20 DIAGNOSIS — K219 Gastro-esophageal reflux disease without esophagitis: Secondary | ICD-10-CM | POA: Insufficient documentation

## 2020-03-20 DIAGNOSIS — Z79890 Hormone replacement therapy (postmenopausal): Secondary | ICD-10-CM | POA: Insufficient documentation

## 2020-03-20 DIAGNOSIS — E559 Vitamin D deficiency, unspecified: Secondary | ICD-10-CM | POA: Diagnosis not present

## 2020-03-20 DIAGNOSIS — Z79899 Other long term (current) drug therapy: Secondary | ICD-10-CM | POA: Insufficient documentation

## 2020-03-20 DIAGNOSIS — D479 Neoplasm of uncertain behavior of lymphoid, hematopoietic and related tissue, unspecified: Secondary | ICD-10-CM | POA: Diagnosis not present

## 2020-03-20 DIAGNOSIS — E119 Type 2 diabetes mellitus without complications: Secondary | ICD-10-CM | POA: Diagnosis not present

## 2020-03-20 DIAGNOSIS — E039 Hypothyroidism, unspecified: Secondary | ICD-10-CM | POA: Diagnosis not present

## 2020-03-20 DIAGNOSIS — C8590 Non-Hodgkin lymphoma, unspecified, unspecified site: Secondary | ICD-10-CM | POA: Diagnosis not present

## 2020-03-20 DIAGNOSIS — K222 Esophageal obstruction: Secondary | ICD-10-CM | POA: Diagnosis not present

## 2020-03-20 DIAGNOSIS — C8591 Non-Hodgkin lymphoma, unspecified, lymph nodes of head, face, and neck: Secondary | ICD-10-CM | POA: Diagnosis not present

## 2020-03-20 HISTORY — PX: LYMPH NODE BIOPSY: SHX201

## 2020-03-20 HISTORY — DX: Irritable bowel syndrome, unspecified: K58.9

## 2020-03-20 HISTORY — DX: Cerebral infarction, unspecified: I63.9

## 2020-03-20 LAB — GLUCOSE, CAPILLARY
Glucose-Capillary: 111 mg/dL — ABNORMAL HIGH (ref 70–99)
Glucose-Capillary: 98 mg/dL (ref 70–99)

## 2020-03-20 SURGERY — LYMPH NODE BIOPSY
Anesthesia: General | Site: Neck | Laterality: Left

## 2020-03-20 MED ORDER — PROPOFOL 10 MG/ML IV BOLUS
INTRAVENOUS | Status: AC
  Filled 2020-03-20: qty 20

## 2020-03-20 MED ORDER — CHLORHEXIDINE GLUCONATE CLOTH 2 % EX PADS
6.0000 | MEDICATED_PAD | Freq: Once | CUTANEOUS | Status: AC
  Administered 2020-03-20: 6 via TOPICAL

## 2020-03-20 MED ORDER — HYDROMORPHONE HCL 1 MG/ML IJ SOLN: 0.2500 mg | INTRAMUSCULAR | Status: DC | PRN

## 2020-03-20 MED ORDER — OXYCODONE HCL 5 MG/5ML PO SOLN: 5.0000 mg | Freq: Once | ORAL | Status: DC | PRN

## 2020-03-20 MED ORDER — CEFAZOLIN SODIUM-DEXTROSE 2-4 GM/100ML-% IV SOLN
2.0000 g | INTRAVENOUS | Status: AC
  Administered 2020-03-20: 2 g via INTRAVENOUS
  Filled 2020-03-20: qty 100

## 2020-03-20 MED ORDER — 0.9 % SODIUM CHLORIDE (POUR BTL) OPTIME
TOPICAL | Status: DC | PRN
  Administered 2020-03-20: 1000 mL

## 2020-03-20 MED ORDER — ORAL CARE MOUTH RINSE: 15.0000 mL | Freq: Once | OROMUCOSAL | Status: AC

## 2020-03-20 MED ORDER — LACTATED RINGERS IV SOLN: INTRAVENOUS | Status: DC

## 2020-03-20 MED ORDER — ACETAMINOPHEN 500 MG PO TABS
1000.0000 mg | ORAL_TABLET | ORAL | Status: AC
  Administered 2020-03-20: 1000 mg via ORAL
  Filled 2020-03-20: qty 2

## 2020-03-20 MED ORDER — ROCURONIUM BROMIDE 10 MG/ML (PF) SYRINGE
PREFILLED_SYRINGE | INTRAVENOUS | Status: DC | PRN
  Administered 2020-03-20: 40 mg via INTRAVENOUS

## 2020-03-20 MED ORDER — FENTANYL CITRATE (PF) 250 MCG/5ML IJ SOLN
INTRAMUSCULAR | Status: AC
  Filled 2020-03-20: qty 5

## 2020-03-20 MED ORDER — BUPIVACAINE-EPINEPHRINE 0.5% -1:200000 IJ SOLN
INTRAMUSCULAR | Status: DC | PRN
  Administered 2020-03-20: 10 mL

## 2020-03-20 MED ORDER — SUGAMMADEX SODIUM 200 MG/2ML IV SOLN
INTRAVENOUS | Status: DC | PRN
Start: 2020-03-20 — End: 2020-03-20
  Administered 2020-03-20: 200 mg via INTRAVENOUS

## 2020-03-20 MED ORDER — ROCURONIUM BROMIDE 10 MG/ML (PF) SYRINGE
PREFILLED_SYRINGE | INTRAVENOUS | Status: AC
  Filled 2020-03-20: qty 10

## 2020-03-20 MED ORDER — BUPIVACAINE HCL (PF) 0.25 % IJ SOLN
INTRAMUSCULAR | Status: AC
  Filled 2020-03-20: qty 30

## 2020-03-20 MED ORDER — ONDANSETRON HCL 4 MG/2ML IJ SOLN
INTRAMUSCULAR | Status: AC
  Filled 2020-03-20: qty 2

## 2020-03-20 MED ORDER — OXYCODONE HCL 5 MG PO TABS: 5.0000 mg | ORAL_TABLET | Freq: Once | ORAL | Status: DC | PRN

## 2020-03-20 MED ORDER — ONDANSETRON HCL 4 MG/2ML IJ SOLN: 4.0000 mg | Freq: Once | INTRAMUSCULAR | Status: DC | PRN

## 2020-03-20 MED ORDER — ENSURE PRE-SURGERY PO LIQD: 296.0000 mL | Freq: Once | ORAL | Status: DC

## 2020-03-20 MED ORDER — GABAPENTIN 300 MG PO CAPS
300.0000 mg | ORAL_CAPSULE | ORAL | Status: AC
  Administered 2020-03-20: 300 mg via ORAL
  Filled 2020-03-20: qty 1

## 2020-03-20 MED ORDER — PROPOFOL 10 MG/ML IV BOLUS
INTRAVENOUS | Status: DC | PRN
  Administered 2020-03-20: 120 mg via INTRAVENOUS

## 2020-03-20 MED ORDER — CHLORHEXIDINE GLUCONATE 0.12 % MT SOLN
15.0000 mL | Freq: Once | OROMUCOSAL | Status: AC
  Administered 2020-03-20: 15 mL via OROMUCOSAL
  Filled 2020-03-20: qty 15

## 2020-03-20 MED ORDER — STERILE WATER FOR IRRIGATION IR SOLN
Status: DC | PRN
Start: 2020-03-20 — End: 2020-03-20
  Administered 2020-03-20: 1000 mL

## 2020-03-20 MED ORDER — FENTANYL CITRATE (PF) 100 MCG/2ML IJ SOLN
INTRAMUSCULAR | Status: DC | PRN
  Administered 2020-03-20: 50 ug via INTRAVENOUS

## 2020-03-20 MED ORDER — MEPERIDINE HCL 25 MG/ML IJ SOLN: 6.2500 mg | INTRAMUSCULAR | Status: DC | PRN

## 2020-03-20 MED ORDER — FENTANYL CITRATE (PF) 100 MCG/2ML IJ SOLN: 25.0000 ug | INTRAMUSCULAR | Status: DC | PRN

## 2020-03-20 MED ORDER — LIDOCAINE 2% (20 MG/ML) 5 ML SYRINGE
INTRAMUSCULAR | Status: AC
  Filled 2020-03-20: qty 5

## 2020-03-20 MED ORDER — CHLORHEXIDINE GLUCONATE CLOTH 2 % EX PADS: 6.0000 | MEDICATED_PAD | Freq: Once | CUTANEOUS | Status: DC

## 2020-03-20 MED ORDER — DEXAMETHASONE SODIUM PHOSPHATE 10 MG/ML IJ SOLN
INTRAMUSCULAR | Status: AC
  Filled 2020-03-20: qty 1

## 2020-03-20 MED ORDER — LIDOCAINE 2% (20 MG/ML) 5 ML SYRINGE
INTRAMUSCULAR | Status: DC | PRN
  Administered 2020-03-20: 60 mg via INTRAVENOUS

## 2020-03-20 MED ORDER — DEXAMETHASONE SODIUM PHOSPHATE 10 MG/ML IJ SOLN
INTRAMUSCULAR | Status: DC | PRN
Start: 2020-03-20 — End: 2020-03-20
  Administered 2020-03-20: 5 mg via INTRAVENOUS

## 2020-03-20 MED ORDER — PHENYLEPHRINE 40 MCG/ML (10ML) SYRINGE FOR IV PUSH (FOR BLOOD PRESSURE SUPPORT)
PREFILLED_SYRINGE | INTRAVENOUS | Status: DC | PRN
  Administered 2020-03-20: 40 ug via INTRAVENOUS
  Administered 2020-03-20: 80 ug via INTRAVENOUS

## 2020-03-20 MED ORDER — ONDANSETRON HCL 4 MG/2ML IJ SOLN
INTRAMUSCULAR | Status: DC | PRN
  Administered 2020-03-20: 4 mg via INTRAVENOUS

## 2020-03-20 SURGICAL SUPPLY — 39 items
ADH SKN CLS APL DERMABOND .7 (GAUZE/BANDAGES/DRESSINGS) ×1
APL PRP STRL LF DISP 70% ISPRP (MISCELLANEOUS) ×1
APPLIER CLIP 9.375 SM OPEN (CLIP) ×2
APR CLP SM 9.3 20 MLT OPN (CLIP) ×1
BLADE CLIPPER SURG (BLADE) ×1 IMPLANT
CANISTER SUCT 3000ML PPV (MISCELLANEOUS) ×1 IMPLANT
CHLORAPREP W/TINT 26 (MISCELLANEOUS) ×2 IMPLANT
CLIP APPLIE 9.375 SM OPEN (CLIP) ×1 IMPLANT
CNTNR URN SCR LID CUP LEK RST (MISCELLANEOUS) ×1 IMPLANT
CONT SPEC 4OZ STRL OR WHT (MISCELLANEOUS) ×2
COVER SURGICAL LIGHT HANDLE (MISCELLANEOUS) ×2 IMPLANT
COVER WAND RF STERILE (DRAPES) ×2 IMPLANT
DERMABOND ADVANCED (GAUZE/BANDAGES/DRESSINGS) ×1
DERMABOND ADVANCED .7 DNX12 (GAUZE/BANDAGES/DRESSINGS) ×1 IMPLANT
DRAPE HALF SHEET 40X57 (DRAPES) ×1 IMPLANT
DRAPE LAPAROTOMY 100X72 PEDS (DRAPES) ×2 IMPLANT
ELECT REM PT RETURN 9FT ADLT (ELECTROSURGICAL) ×2
ELECTRODE REM PT RTRN 9FT ADLT (ELECTROSURGICAL) ×1 IMPLANT
GAUZE 4X4 16PLY RFD (DISPOSABLE) ×2 IMPLANT
GLOVE BIO SURGEON STRL SZ8 (GLOVE) ×2 IMPLANT
GLOVE BIOGEL PI IND STRL 8 (GLOVE) ×1 IMPLANT
GLOVE BIOGEL PI INDICATOR 8 (GLOVE) ×1
GOWN STRL REUS W/ TWL LRG LVL3 (GOWN DISPOSABLE) ×1 IMPLANT
GOWN STRL REUS W/ TWL XL LVL3 (GOWN DISPOSABLE) ×1 IMPLANT
GOWN STRL REUS W/TWL LRG LVL3 (GOWN DISPOSABLE) ×2
GOWN STRL REUS W/TWL XL LVL3 (GOWN DISPOSABLE) ×2
KIT BASIN OR (CUSTOM PROCEDURE TRAY) ×2 IMPLANT
KIT TURNOVER KIT B (KITS) ×2 IMPLANT
NEEDLE 22X1 1/2 (OR ONLY) (NEEDLE) ×2 IMPLANT
NS IRRIG 1000ML POUR BTL (IV SOLUTION) ×2 IMPLANT
PACK GENERAL/GYN (CUSTOM PROCEDURE TRAY) ×2 IMPLANT
PAD ARMBOARD 7.5X6 YLW CONV (MISCELLANEOUS) ×2 IMPLANT
PENCIL SMOKE EVACUATOR (MISCELLANEOUS) ×2 IMPLANT
SUT MNCRL AB 4-0 PS2 18 (SUTURE) ×2 IMPLANT
SUT VIC AB 3-0 SH 27 (SUTURE) ×2
SUT VIC AB 3-0 SH 27X BRD (SUTURE) ×1 IMPLANT
SYR CONTROL 10ML LL (SYRINGE) ×2 IMPLANT
TOWEL GREEN STERILE (TOWEL DISPOSABLE) ×2 IMPLANT
TOWEL GREEN STERILE FF (TOWEL DISPOSABLE) ×2 IMPLANT

## 2020-03-20 NOTE — Interval H&P Note (Signed)
History and Physical Interval Note:  03/20/2020 8:33 AM  Elizabeth Mcintyre  has presented today for surgery, with the diagnosis of LYMPHOMA.  The various methods of treatment have been discussed with the patient and family. After consideration of risks, benefits and other options for treatment, the patient has consented to  Procedure(s): LEFT POSTERIOR CERVICAL LYMPH NODE BIOPSY (Left) as a surgical intervention.  The patient's history has been reviewed, patient examined, no change in status, stable for surgery.  I have reviewed the patient's chart and labs.  Questions were answered to the patient's satisfaction.     [name]

## 2020-03-20 NOTE — Anesthesia Preprocedure Evaluation (Addendum)
Anesthesia Evaluation  Patient identified by MRN, date of birth, ID band Patient awake    Reviewed: Allergy & Precautions, H&P , NPO status , Patient's Chart, lab work & pertinent test results  History of Anesthesia Complications (+) PONV and history of anesthetic complications  Airway Mallampati: III  TM Distance: >3 FB Neck ROM: Full  Mouth opening: Limited Mouth Opening  Dental no notable dental hx. (+) Dental Advisory Given, Teeth Intact   Pulmonary asthma ,    Pulmonary exam normal breath sounds clear to auscultation       Cardiovascular hypertension, Pt. on medications Normal cardiovascular exam Rhythm:Regular Rate:Normal     Neuro/Psych  Headaches, PSYCHIATRIC DISORDERS Anxiety Depression CVA, No Residual Symptoms    GI/Hepatic Neg liver ROS, GERD  Medicated and Controlled,  Endo/Other  diabetes, Type 2, Oral Hypoglycemic AgentsHypothyroidism   Renal/GU Renal InsufficiencyRenal disease     Musculoskeletal   Abdominal   Peds  Hematology negative hematology ROS (+)   Anesthesia Other Findings   Reproductive/Obstetrics negative OB ROS                         Anesthesia Physical Anesthesia Plan  ASA: III  Anesthesia Plan: General   Post-op Pain Management:    Induction: Intravenous  PONV Risk Score and Plan: 4 or greater and Ondansetron, Dexamethasone and Treatment may vary due to age or medical condition  Airway Management Planned: Oral ETT and Video Laryngoscope Planned  Additional Equipment: None  Intra-op Plan:   Post-operative Plan: Extubation in OR  Informed Consent: I have reviewed the patients History and Physical, chart, labs and discussed the procedure including the risks, benefits and alternatives for the proposed anesthesia with the patient or authorized representative who has indicated his/her understanding and acceptance.     Dental advisory given  Plan  Discussed with: CRNA  Anesthesia Plan Comments:       Anesthesia Quick Evaluation

## 2020-03-20 NOTE — Op Note (Signed)
  03/20/2020  9:53 AM  PATIENT:  Elizabeth Mcintyre  79 y.o. female  PRE-OPERATIVE DIAGNOSIS: Lymphoma, Left Posterior Cervical Lymphadenopathy  POST-OPERATIVE DIAGNOSIS: Lymphoma, left posterior cervical lymphadenopathy  PROCEDURE:  Procedure(s): LEFT POSTERIOR CERVICAL LYMPH NODE BIOPSY  SURGEON:  Surgeon(s): Georganna Skeans, MD  ASSISTANTS: none   ANESTHESIA:   local and general  EBL:  Total I/O In: 500 [I.V.:500] Out: 10 [Blood:10]  BLOOD ADMINISTERED:none  DRAINS: none   SPECIMEN:  Excision  DISPOSITION OF SPECIMEN:  PATHOLOGY  COUNTS:  YES  DICTATION: .Dragon Dictation Findings: 1 cm lymph node left posterior cervical region  Procedure in detail: Informed consent was obtained.  She received intravenous antibiotics.  She was brought to the operating room and general anesthesia was administered by the anesthesia staff.  She was positioned with appropriate padding.  Her left posterior neck was prepped and draped in a sterile fashion.  We did a timeout procedure.  I made a transverse incision over the palpable enlarged lymph node.  Subcutaneous tissues were dissected down revealing the lymph node.  It was circumferentially dissected.  Some small vessels and lymphatics were clipped and it was removed at its entirety.  It was sent to pathology fresh.  The wound was copiously irrigated.  Hemostasis was ensured.  I injected local anesthetic.  The wound was then closed with interrupted 3-0 Vicryl followed by Dermabond.  All counts were correct.  She tolerated the procedure well without apparent complication and was taken recovery in stable condition. PATIENT DISPOSITION:  PACU - hemodynamically stable.   Delay start of Pharmacological VTE agent (>24hrs) due to surgical blood loss or risk of bleeding:  no  Georganna Skeans, MD, MPH, FACS Pager: [phone]  7/6/20219:53 AM

## 2020-03-20 NOTE — Anesthesia Postprocedure Evaluation (Signed)
Anesthesia Post Note  Patient: Elizabeth Mcintyre  Procedure(s) Performed: LEFT POSTERIOR CERVICAL LYMPH NODE BIOPSY (Left Neck)     Patient location during evaluation: PACU Anesthesia Type: General Level of consciousness: sedated and patient cooperative Pain management: pain level controlled Vital Signs Assessment: post-procedure vital signs reviewed and stable Respiratory status: spontaneous breathing Cardiovascular status: stable Anesthetic complications: no   No complications documented.  Last Vitals:  Vitals:   03/20/20 1030 03/20/20 1045  BP: (!) 161/70 (!) 154/64  Pulse: 66 69  Resp: 19 20  Temp:  36.6 C  SpO2: 100% 97%    Last Pain:  Vitals:   03/20/20 1046  TempSrc:   PainSc: 0-No pain                 Nolon Nations

## 2020-03-20 NOTE — Anesthesia Procedure Notes (Signed)
Procedure Name: Intubation Date/Time: 03/20/2020 9:04 AM Performed by: Malachi Paradise, RN Pre-anesthesia Checklist: Patient identified, Emergency Drugs available, Suction available and Patient being monitored Patient Re-evaluated:Patient Re-evaluated prior to induction Oxygen Delivery Method: Circle system utilized Preoxygenation: Pre-oxygenation with 100% oxygen Induction Type: IV induction Ventilation: Mask ventilation without difficulty Laryngoscope Size: Mac and 3 Grade View: Grade I Tube type: Oral Tube size: 7.0 mm Number of attempts: 1 Airway Equipment and Method: Stylet and Oral airway Placement Confirmation: ETT inserted through vocal cords under direct vision,  positive ETCO2 and breath sounds checked- equal and bilateral Secured at: 21 cm Tube secured with: Tape Dental Injury: Teeth and Oropharynx as per pre-operative assessment

## 2020-03-20 NOTE — Transfer of Care (Signed)
Immediate Anesthesia Transfer of Care Note  Patient: Elizabeth Mcintyre  Procedure(s) Performed: LEFT POSTERIOR CERVICAL LYMPH NODE BIOPSY (Left Neck)  Patient Location: PACU  Anesthesia Type:General  Level of Consciousness: awake and alert   Airway & Oxygen Therapy: Patient Spontanous Breathing and Patient connected to face mask oxygen  Post-op Assessment: Report given to RN and Post -op Vital signs reviewed and stable  Post vital signs: Reviewed and stable  Last Vitals:  Vitals Value Taken Time  BP 159/97 03/20/20 0955  Temp    Pulse 73 03/20/20 0955  Resp 15 03/20/20 0955  SpO2 100 % 03/20/20 0955  Vitals shown include unvalidated device data.  Last Pain:  Vitals:   03/20/20 0809  TempSrc:   PainSc: 0-No pain         Complications: No complications documented.

## 2020-03-21 ENCOUNTER — Ambulatory Visit: Payer: Medicare Other

## 2020-03-21 ENCOUNTER — Ambulatory Visit: Payer: Medicare Other | Admitting: Hematology and Oncology

## 2020-03-21 ENCOUNTER — Encounter (HOSPITAL_COMMUNITY): Payer: Self-pay | Admitting: General Surgery

## 2020-03-22 ENCOUNTER — Ambulatory Visit: Payer: Medicare Other

## 2020-03-23 LAB — SURGICAL PATHOLOGY

## 2020-03-23 NOTE — Progress Notes (Signed)
Pharmacist Chemotherapy Monitoring - Initial Assessment    Anticipated start date: 03/29/20    Regimen:  . Are orders appropriate based on the patient's diagnosis, regimen, and cycle? Yes . Does the plan date match the patient's scheduled date? Yes . Is the sequencing of drugs appropriate? Yes . Are the premedications appropriate for the patient's regimen? Yes . Prior Authorization for treatment is: Pending o If applicable, is the correct biosimilar selected based on the patient's insurance? not applicable  Organ Function and Labs: Marland Kitchen Are dose adjustments needed based on the patient's renal function, hepatic function, or hematologic function? No . Are appropriate labs ordered prior to the start of patient's treatment? Yes . Other organ system assessment, if indicated: N/A . The following baseline labs, if indicated, have been ordered: N/A  Dose Assessment: . Are the drug doses appropriate? Yes . Are the following correct: o Drug concentrations Yes o IV fluid compatible with drug Yes o Administration routes Yes o Timing of therapy Yes . If applicable, does the patient have documented access for treatment and/or plans for port-a-cath placement? not applicable . If applicable, have lifetime cumulative doses been properly documented and assessed? not applicable Lifetime Dose Tracking  No doses have been documented on this patient for the following tracked chemicals: Doxorubicin, Epirubicin, Idarubicin, Daunorubicin, Mitoxantrone, Bleomycin, Oxaliplatin, Carboplatin, Liposomal Doxorubicin  o   Toxicity Monitoring/Prevention: . The patient has the following take home antiemetics prescribed: Ondansetron, Prochlorperazine, Dexamethasone, Lorazepam and Olanzapine . The patient has the following take home medications prescribed: N/A . Medication allergies and previous infusiony related reactions, if applicable, have been reviewed and addressed. Yes . The patient's current medication list has  been assessed for drug-drug interactions with their chemotherapy regimen. no significant drug-drug interactions were identified on review.  Order Review: . Are the treatment plan orders signed? Yes . Is the patient scheduled to see a provider prior to their treatment? Yes  I verify that I have reviewed each item in the above checklist and answered each question accordingly.  Saydi Kobel K 03/23/2020 3:38 PM

## 2020-03-26 NOTE — Progress Notes (Signed)
[place] [phone]   [phone]  PROGRESS NOTE  Patient Care Team: Ma Hillock, DO as PCP - General (Family Medicine) Danie Binder, MD (Inactive) as Consulting Physician (Gastroenterology) Gala Romney Cristopher Estimable, MD as Consulting Physician (Gastroenterology) Minus Breeding, MD as Consulting Physician (Cardiology) Annitta Needs, NP (Gastroenterology) Carlis Stable, NP as Nurse Practitioner (Gastroenterology)  Hematological/Oncological History  # Low Grade Marginal Zone Lymphoma, Stage III 1) 06/06/2019: patient underwent a diagnostic mammogram of the left breast. Calcifications noted in the left breast, recommended 6 month f/u imaging. 2) 02/02/2020: bilateral diagnostic mammogram showedabnormal enlarged lymph nodes with cortical thickening. The largest of these measures 2.2 x 1.7 x 2.0 cm. 3) 02/02/2020: US guided biopsy of left axillary lymph nodes performed. 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 4) 02/16/2020: establish care with Dr. Lorenso Courier 5) 02/29/2020: PET CT scan performed, showed hypermetabolic lymph nodes identified in the posterior left neck, both axillary/subpectoral regions, mediastinum, hila, right external iliac chain, and right groin. Massive splenomegaly of 17.8 cm noted as well.  6) 03/20/2020: excisional biopsy of left posterior cervical lymph node. Biopsy confirmed most likely low grade marginal zone lymphoma.    Interval History:  Elizabeth Mcintyre 79 y.o. 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 The patient's last visit was on 03/01/2020 at which time the results of her PET CT scan showed hypermetabolic lymph nodes identified in the posterior left neck, both  axillary/subpectoral regions, mediastinum, hila, right external iliac chain, and right groin. Splenomegaly of 17.8cm was noted as well. In the interim since her last visit she underwent an excision biopsy of a left posterior cervical lymph node and confirmed the diagnosis of a low grade marginal zone lymphoma.   On exam today Elizabeth Mcintyre notes that the biopsy procedure went well when performed on 03/20/2020.  She reports that she is not had any additional bleeding or swelling at the site where the lymph node excision was performed.  She denies having any discharge or local pain.  She does report having a headache today as well as some pain in her right abdomen.  She notes that she has been taking Tylenol for this pain that has been relatively effective.  She is unsure what dose that she is taking.  She does note that she has a decline in energy as well as occasional sweats, but no nausea, vomiting, or diarrhea.  She is ready to start chemotherapy treatment today.  A full 10 point ROS is listed below.  MEDICAL HISTORY:  Past Medical History:  Diagnosis Date  . Asthma   . Bloating 04/26/2019  . Cancer ([facility]) 2021   Lymphoma  . 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 DIABETES MELLITUS, TYPE II 11/09/2007   diet control  . Diverticulosis 03/2011  . GERD (gastroesophageal reflux disease)    none recently  . GI bleed   . History of rheumatoid arthritis    during 30's, was treated.  . Hyperlipidemia   . Hypertension   . IBS (irritable bowel syndrome)   . Osteopenia 2017   Last  bone density 05/04/2017: -2.4  . PONV (postoperative nausea and vomiting)   . Stress incontinence   . Stroke [facility])  mini stroke - found on a CT scan    SURGICAL HISTORY: Past Surgical History:  Procedure Laterality Date  . ABDOMINAL HYSTERECTOMY    . CARDIAC CATHETERIZATION     X 2, last one in 1998  . CHOLECYSTECTOMY N/A 04/13/2017   Procedure: LAPAROSCOPIC CHOLECYSTECTOMY;   Surgeon: Aviva Signs, MD;  Location: [facility];  Service: General;  Laterality: N/A;  . COLONOSCOPY    . COLONOSCOPY  May 2012   Dr. Olevia Perches: mild diverticulosis, otherwise normal.   . ESOPHAGOGASTRODUODENOSCOPY N/A 01/28/2015   Dr. Gala Romney: reflux esophagitis, Schatzki's ring not manipulated due to recent bleeding  . ESOPHAGOGASTRODUODENOSCOPY N/A 03/30/2015   Dr. Gala Romney: Schatzki's ring s/p Venia Minks dilation, previously noted esophageal ulcer completely healed  . IR IMAGING GUIDED PORT INSERTION  03/13/2020  . LYMPH NODE BIOPSY Left 03/20/2020   Procedure: LEFT POSTERIOR CERVICAL LYMPH NODE BIOPSY;  Surgeon: Georganna Skeans, MD;  Location: [place];  Service: General;  Laterality: Left;  Marland Kitchen MALONEY DILATION N/A 03/30/2015   Procedure: Venia Minks DILATION;  Surgeon: Daneil Dolin, MD;  Location: [facility];  Service: Endoscopy;  Laterality: N/A;    SOCIAL HISTORY: Social History   Socioeconomic History  . Marital status: Widowed    Spouse name: Not on file  . Number of children: 2  . Years of education: Not on file  . Highest education level: Not on file  Occupational History  . Occupation: retired  Tobacco Use  . Smoking status: Never Smoker  . Smokeless tobacco: Never Used  Vaping Use  . Vaping Use: Never used  Substance and Sexual Activity  . Alcohol use: No  . Drug use: No  . Sexual activity: Never  Other Topics Concern  . Not on file  Social History Narrative   Elizabeth Mcintyre is widowed. Her young grandson lives with her, for whom she shares custody with her daughter, the son's aunt.     Social Determinants of Health   Financial Resource Strain:   . Difficulty of Paying Living Expenses:   Food Insecurity:   . Worried About Charity fundraiser in the Last Year:   . Arboriculturist in the Last Year:   Transportation Needs:   . Film/video editor (Medical):   Marland Kitchen Lack of Transportation (Non-Medical):   Physical Activity:   . Days of Exercise per Week:   . Minutes of Exercise  per Session:   Stress:   . Feeling of Stress :   Social Connections:   . Frequency of Communication with Friends and Family:   . Frequency of Social Gatherings with Friends and Family:   . Attends Religious Services:   . Active Member of Clubs or Organizations:   . Attends Archivist Meetings:   Marland Kitchen Marital Status:   Intimate Partner Violence:   . Fear of Current or Ex-Partner:   . Emotionally Abused:   Marland Kitchen Physically Abused:   . Sexually Abused:     FAMILY HISTORY: Family History  Problem Relation Age of Onset  . Heart disease Mother   . Osteoarthritis Mother   . Sudden death Father   . Single kidney Father   . Other Father        h/o severe MVA injuries  . Hyperlipidemia Sister   . Other Daughter        Myalgias  . Fibromyalgia Daughter   . Allergies Daughter   . Heart disease Maternal Grandfather   . Sudden death Paternal Grandmother   .  Diabetes Paternal Grandfather   . Heart disease Daughter   . Other Daughter        palpitations  . Pulmonary fibrosis Maternal Aunt   . Cancer Paternal Uncle   . Pulmonary fibrosis Maternal Aunt   . Colon cancer Neg Hx     ALLERGIES:  is allergic to codeine phosphate.  MEDICATIONS:  Current Outpatient Medications  Medication Sig Dispense Refill  . acetaminophen (TYLENOL) 325 MG tablet Take 650 mg by mouth every 6 (six) hours as needed for moderate pain.     Marland Kitchen albuterol (VENTOLIN HFA) 108 (90 Base) MCG/ACT inhaler Inhale 2 puffs into the lungs every 6 (six) hours as needed for wheezing. 8.5 g 2  . allopurinol (ZYLOPRIM) 300 MG tablet Take 1 tablet (300 mg total) by mouth daily. (Patient not taking: Reported on 03/08/2020) 30 tablet 3  . amLODipine (NORVASC) 10 MG tablet Take 1 tablet (10 mg total) by mouth daily. 90 tablet 1  . Cholecalciferol (VITAMIN D3) 25 MCG (1000 UT) CAPS Take 1,000 Units by mouth daily.     Marland Kitchen dicyclomine (BENTYL) 10 MG capsule Take 1 capsule (10 mg total) by mouth 4 (four) times daily -  before meals  and at bedtime. Needs appt for further refills. 360 capsule 0  . levothyroxine (SYNTHROID) 75 MCG tablet Take 1 tablet (75 mcg total) by mouth daily. 90 tablet 3  . lidocaine-prilocaine (EMLA) cream Apply 1 application topically as needed. 30 g 0  . ondansetron (ZOFRAN) 8 MG tablet Take 1 tablet (8 mg total) by mouth every 8 (eight) hours as needed for nausea or vomiting. 30 tablet 1  . prochlorperazine (COMPAZINE) 10 MG tablet Take 1 tablet (10 mg total) by mouth every 6 (six) hours as needed for nausea or vomiting. 30 tablet 1  . vitamin B-12 (CYANOCOBALAMIN) 1000 MCG tablet Take 1,000 mcg by mouth daily.     No current facility-administered medications for this visit.    REVIEW OF SYSTEMS:   Constitutional: ( - ) fevers, ( - )  chills , ( - ) night sweats Eyes: ( - ) blurriness of vision, ( - ) double vision, ( - ) watery eyes Ears, nose, mouth, throat, and face: ( - ) mucositis, ( - ) sore throat Respiratory: ( - ) cough, ( - ) dyspnea, ( - ) wheezes Cardiovascular: ( - ) palpitation, ( - ) chest discomfort, ( - ) lower extremity swelling Gastrointestinal:  ( - ) nausea, ( - ) heartburn, ( - ) change in bowel habits Skin: ( - ) abnormal skin rashes Lymphatics: ( - ) new lymphadenopathy, ( - ) easy bruising Neurological: ( - ) numbness, ( - ) tingling, ( - ) new weaknesses Behavioral/Psych: ( - ) mood change, ( - ) new changes  All other systems were reviewed with the patient and are negative.  PHYSICAL EXAMINATION: ECOG PERFORMANCE STATUS: 1 - Symptomatic but completely ambulatory  Vitals:   03/28/20 0833  BP: (!) 133/54  Pulse: 85  Resp: 17  Temp: (!) 97.5 F (36.4 C)  SpO2: 99%   Filed Weights   03/28/20 0833  Weight: 140 lb 6.4 oz (63.7 kg)    GENERAL: well appearing elderly Caucasian female in NAD  SKIN: skin color, texture, turgor are normal. Small dime sized dry skin lesions on chest, about 5 in number.  EYES: conjunctiva are pink and non-injected, sclera  clear NECK: supple, non-tender. Healing incision on left neck at site of excisional biopsy.  LUNGS:  clear to auscultation and percussion with normal breathing effort HEART: regular rate & rhythm and no murmurs and no lower extremity edema Musculoskeletal: no cyanosis of digits and no clubbing  PSYCH: alert & oriented x 3, fluent speech NEURO: no focal motor/sensory deficits  LABORATORY DATA:  I have reviewed the data as listed CBC Latest Ref Rng & Units 03/28/2020 03/13/2020 03/01/2020  WBC 4.0 - 10.5 K/uL 5.8 5.9 5.9  Hemoglobin 12.0 - 15.0 g/dL 10.4(L) 12.7 12.4  Hematocrit 36 - 46 % 31.4(L) 38.4 37.7  Platelets 150 - 400 K/uL 106(L) 138(L) 130(L)    CMP Latest Ref Rng & Units 03/28/2020 03/13/2020 03/01/2020  Glucose 70 - 99 mg/dL 135(H) 123(H) 128(H)  BUN 8 - 23 mg/dL 22 22 20   Creatinine 0.44 - 1.00 mg/dL 1.41(H) 1.58(H) 1.58(H)  Sodium 135 - 145 mmol/L 133(L) 139 141  Potassium 3.5 - 5.1 mmol/L 3.7 3.8 4.0  Chloride 98 - 111 mmol/L 101 101 106  CO2 22 - 32 mmol/L 20(L) 24 24  Calcium 8.9 - 10.3 mg/dL 9.3 9.4 9.6  Total Protein 6.5 - 8.1 g/dL 6.9 - 7.2  Total Bilirubin 0.3 - 1.2 mg/dL 0.5 - 0.4  Alkaline Phos 38 - 126 U/L 65 - 77  AST 15 - 41 U/L 16 - 20  ALT 0 - 44 U/L 10 - 10    RADIOGRAPHIC STUDIES: I have personally reviewed the radiological images as listed and agreed with the findings in the report: FDG avid spleen and axillary lymph nodes. Involvement of cervical and inguinal nodes as well.  NM PET Image Initial (PI) Skull Base To Thigh  Result Date: 02/29/2020 CLINICAL DATA:  Initial treatment strategy for lymphoma. EXAM: NUCLEAR MEDICINE PET SKULL BASE TO THIGH TECHNIQUE: 6.9 mCi F-18 FDG was injected intravenously. Full-ring PET imaging was performed from the skull base to thigh after the radiotracer. CT data was obtained and used for attenuation correction and anatomic localization. Fasting blood glucose: 98 mg/dl COMPARISON:  Abdomen/pelvis CT 05/10/2019 FINDINGS:  Mediastinal blood pool activity: SUV max 2.5 Liver activity: SUV max 3.8 NECK: 7 mm posterior cervical node on image 31/5 is hypermetabolic with SUV max = 5.5. No other hypermetabolic cervical lymphadenopathy. Incidental CT findings: none CHEST: Hypermetabolic lymphadenopathy is seen in both subpectoral and axillary regions (left greater than right), mediastinum, and both hilar regions. Index left axillary node on 61/3 measures 18 mm with SUV max = 16.8. Right axillary node measures 9 mm short axis on 68/2 with SUV max = 7.5. Low right paratracheal node measuring 12 mm short axis on 64/3 demonstrates SUV max = 9.1. Incidental CT findings: No cardiomegaly. Coronary artery calcification is evident. Atherosclerotic calcification is noted in the wall of the thoracic aorta. No suspicious pulmonary nodule or mass. ABDOMEN/PELVIS: Spleen is enlarged at 17.8 cm craniocaudal length with marked diffuse hypermetabolism ( SUV max = 8.5). No hypermetabolic lymphadenopathy in the abdomen. 7 mm short axis right common iliac node on 400/8 is hypermetabolic with SUV max = 5.3. 9 mm short axis right groin lymph node (184/3) demonstrates SUV max = 3.7. Low level hypermetabolism identified in small left groin lymph nodes. Incidental CT findings: Gallbladder surgically absent. Small cyst noted right kidney. Mild diverticular changes noted left colon. SKELETON: Small hypermetabolic focus identified in the spinous process of L5 ( SUV max = 3.7). No underlying bony lesion by CT imaging. Incidental CT findings: none IMPRESSION: 1. Hypermetabolic lymph nodes identified in the posterior left neck, both axillary/subpectoral regions, mediastinum, hila, right  external iliac chain, and right groin. Sites represent a combination of Deauville 3, 4, and 5 disease. 2. Splenomegaly with diffuse splenic hypermetabolism compatible with lymphoma involvement. 3.  Aortic Atherosclerois (ICD10-170.0) Electronically Signed   By: Misty Stanley M.D.   On:  02/29/2020 15:09   IR IMAGING GUIDED PORT INSERTION  Result Date: 03/13/2020 CLINICAL DATA:  Lymphoma, access chemotherapy EXAM: RIGHT INTERNAL JUGULAR SINGLE LUMEN POWER PORT CATHETER INSERTION Date:  03/13/2020 03/13/2020 1:09 pm Radiologist:  Jerilynn Mages. Daryll Brod, MD Guidance:  Ultrasound and fluoroscopic MEDICATIONS: Ancef 2 g; The antibiotic was administered within an appropriate time interval prior to skin puncture. ANESTHESIA/SEDATION: Versed 2.0 mg IV; Fentanyl 100 mcg IV; Moderate Sedation Time:  23 minutes The patient was continuously monitored during the procedure by the interventional radiology nurse under my direct supervision. FLUOROSCOPY TIME:  0 minutes, 30 seconds (4 mGy) COMPLICATIONS: None immediate. CONTRAST:  None. PROCEDURE: Informed consent was obtained from the patient following explanation of the procedure, risks, benefits and alternatives. The patient understands, agrees and consents for the procedure. All questions were addressed. A time out was performed. Maximal barrier sterile technique utilized including caps, mask, sterile gowns, sterile gloves, large sterile drape, hand hygiene, and 2% chlorhexidine scrub. Under sterile conditions and local anesthesia, right internal jugular micropuncture venous access was performed. Access was performed with ultrasound. Images were obtained for documentation of the patent right internal jugular vein. A guide wire was inserted followed by a transitional dilator. This allowed insertion of a guide wire and catheter into the IVC. Measurements were obtained from the SVC / RA junction back to the right IJ venotomy site. In the right infraclavicular chest, a subcutaneous pocket was created over the second anterior rib. This was done under sterile conditions and local anesthesia. 1% lidocaine with epinephrine was utilized for this. A 2.5 cm incision was made in the skin. Blunt dissection was performed to create a subcutaneous pocket over the right pectoralis  major muscle. The pocket was flushed with saline vigorously. There was adequate hemostasis. The port catheter was assembled and checked for leakage. The port catheter was secured in the pocket with two retention sutures. The tubing was tunneled subcutaneously to the right venotomy site and inserted into the SVC/RA junction through a valved peel-away sheath. Position was confirmed with fluoroscopy. Images were obtained for documentation. The patient tolerated the procedure well. No immediate complications. Incisions were closed in a two layer fashion with 4 - 0 Vicryl suture. Dermabond was applied to the skin. The port catheter was accessed, blood was aspirated followed by saline and heparin flushes. Needle was removed. A dry sterile dressing was applied. IMPRESSION: Ultrasound and fluoroscopically guided right internal jugular single lumen power port catheter insertion. Tip in the SVC/RA junction. Catheter ready for use. Electronically Signed   By: Jerilynn Mages.  Shick M.D.   On: 03/13/2020 13:15    ASSESSMENT & PLAN YUKARI FLAX 79 y.o. 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   PET CT scan was performed which showed hypermetabolic lymph nodes identified in the posterior left neck, both axillary/subpectoral regions, mediastinum, hila, right external iliac chain, and right groin. Splenomegaly of 17.8cm was noted as well. These findings represent an indication for treatment based on the GELF criteria. Diagnosis was confirmed with an excisional biopsy on 03/20/2020.   At this time I would recommend a rituximab based regimen, with a preference for Bendamustine/Rituximab. This regimen would consist of  bendamustine 90 mg/m2 IV once per day on days 1 & 2 and rituximab 375 mg/m2 IV once on day 1.  The cycles are 28 days and the plan would be for 8 cycles. If this was not tolerated, could consider chlorambucil + rituximab vs. rituximab monotherapy.     GELF Criteria: 1 (splenomegaly). Indication for treatment  # Low Grade Marginal Zone Lymphoma, Stage III --proceeding with bendamustine + rituximab (regimen noted above). If patient is unable to tolerate this treatment can consider rituximab monotherapy instead. Plan to start therapy today (03/28/2020) --patient completed excisional lymph node biopsy to confirm the diagnosis. Pathological exam of the lymph node confirms marginal zone lymphoma.  --PET CT scan confirms stage III disease. Patient meets GELF criteria for treatment (splenomegaly). She fortunately does not have any B symptoms and her counts are stable at this time, with some mild thrombocytopenia.  --patient completed port placement and chemotherapy education.   #Symptom Management --prescribed zofran 8mg  PO q8H PRN and compazine 10mg  PO q6H PRN -- prescribed EMLA cream for patient's port site --prescribed allopurinol 300 mg PO daily while on this regimen as a precaution for TLS --for abdominal pain/headaches, can take tylenol 650mg -1000mg  PO q8H PRN.   No orders of the defined types were placed in this encounter.   All questions were answered. The patient knows to call the clinic with any problems, questions or concerns.  A total of more than 30 minutes were spent on this encounter and over half of that time was spent on counseling and coordination of care as outlined above.   Ledell Peoples, MD Department of Hematology/Oncology [place] [facility] Phone: [phone] Pager: [phone] Email: [email or web address]  03/28/2020 9:17 AM   Literature Support:  Artist Pais der Angeline Slim BS, Lyna Poser, Hertzberg M, Kwan YL, Dupre D, Craig M, Kolibaba K, Issa S, [place], Hickory DM, Munteanu M, Aram Beecham JM. 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 Blood. 2014 May  8;123(19):2944-52.  --The overall response rates for BR and R-CHOP/R-CVP were 97% and 91%, respectively (P = .[id number]). These data indicate BR is noninferior to standard therapy with regard to clinical response with an acceptable safety profile.

## 2020-03-27 ENCOUNTER — Other Ambulatory Visit: Payer: Self-pay | Admitting: Hematology and Oncology

## 2020-03-27 DIAGNOSIS — C8580 Other specified types of non-Hodgkin lymphoma, unspecified site: Secondary | ICD-10-CM

## 2020-03-28 ENCOUNTER — Inpatient Hospital Stay (HOSPITAL_BASED_OUTPATIENT_CLINIC_OR_DEPARTMENT_OTHER): Payer: Medicare Other | Admitting: Hematology and Oncology

## 2020-03-28 ENCOUNTER — Inpatient Hospital Stay: Payer: Medicare Other | Attending: Hematology and Oncology

## 2020-03-28 ENCOUNTER — Encounter: Payer: Self-pay | Admitting: Hematology and Oncology

## 2020-03-28 ENCOUNTER — Other Ambulatory Visit: Payer: Self-pay

## 2020-03-28 ENCOUNTER — Inpatient Hospital Stay: Payer: Medicare Other

## 2020-03-28 VITALS — BP 142/57 | HR 100 | Temp 98.3°F | Resp 18

## 2020-03-28 VITALS — BP 133/54 | HR 85 | Temp 97.5°F | Resp 17 | Ht 62.0 in | Wt 140.4 lb

## 2020-03-28 DIAGNOSIS — E1122 Type 2 diabetes mellitus with diabetic chronic kidney disease: Secondary | ICD-10-CM | POA: Diagnosis not present

## 2020-03-28 DIAGNOSIS — Z809 Family history of malignant neoplasm, unspecified: Secondary | ICD-10-CM | POA: Diagnosis not present

## 2020-03-28 DIAGNOSIS — Z8261 Family history of arthritis: Secondary | ICD-10-CM | POA: Diagnosis not present

## 2020-03-28 DIAGNOSIS — C8580 Other specified types of non-Hodgkin lymphoma, unspecified site: Secondary | ICD-10-CM

## 2020-03-28 DIAGNOSIS — Z7189 Other specified counseling: Secondary | ICD-10-CM

## 2020-03-28 DIAGNOSIS — Z8673 Personal history of transient ischemic attack (TIA), and cerebral infarction without residual deficits: Secondary | ICD-10-CM | POA: Diagnosis not present

## 2020-03-28 DIAGNOSIS — E1169 Type 2 diabetes mellitus with other specified complication: Secondary | ICD-10-CM | POA: Diagnosis not present

## 2020-03-28 DIAGNOSIS — N2581 Secondary hyperparathyroidism of renal origin: Secondary | ICD-10-CM | POA: Diagnosis not present

## 2020-03-28 DIAGNOSIS — K5730 Diverticulosis of large intestine without perforation or abscess without bleeding: Secondary | ICD-10-CM | POA: Diagnosis not present

## 2020-03-28 DIAGNOSIS — Z95828 Presence of other vascular implants and grafts: Secondary | ICD-10-CM | POA: Insufficient documentation

## 2020-03-28 DIAGNOSIS — C8510 Unspecified B-cell lymphoma, unspecified site: Secondary | ICD-10-CM | POA: Diagnosis not present

## 2020-03-28 DIAGNOSIS — Z8711 Personal history of peptic ulcer disease: Secondary | ICD-10-CM | POA: Diagnosis not present

## 2020-03-28 DIAGNOSIS — K2211 Ulcer of esophagus with bleeding: Secondary | ICD-10-CM | POA: Diagnosis not present

## 2020-03-28 DIAGNOSIS — R1010 Upper abdominal pain, unspecified: Secondary | ICD-10-CM | POA: Diagnosis not present

## 2020-03-28 DIAGNOSIS — Z5112 Encounter for antineoplastic immunotherapy: Secondary | ICD-10-CM | POA: Diagnosis not present

## 2020-03-28 DIAGNOSIS — K2101 Gastro-esophageal reflux disease with esophagitis, with bleeding: Secondary | ICD-10-CM | POA: Diagnosis not present

## 2020-03-28 DIAGNOSIS — E782 Mixed hyperlipidemia: Secondary | ICD-10-CM | POA: Diagnosis not present

## 2020-03-28 DIAGNOSIS — J4520 Mild intermittent asthma, uncomplicated: Secondary | ICD-10-CM | POA: Diagnosis not present

## 2020-03-28 DIAGNOSIS — Z20822 Contact with and (suspected) exposure to covid-19: Secondary | ICD-10-CM | POA: Diagnosis not present

## 2020-03-28 DIAGNOSIS — M533 Sacrococcygeal disorders, not elsewhere classified: Secondary | ICD-10-CM | POA: Diagnosis not present

## 2020-03-28 DIAGNOSIS — M8580 Other specified disorders of bone density and structure, unspecified site: Secondary | ICD-10-CM | POA: Diagnosis not present

## 2020-03-28 DIAGNOSIS — R079 Chest pain, unspecified: Secondary | ICD-10-CM | POA: Diagnosis not present

## 2020-03-28 DIAGNOSIS — K920 Hematemesis: Secondary | ICD-10-CM | POA: Diagnosis not present

## 2020-03-28 DIAGNOSIS — Z91041 Radiographic dye allergy status: Secondary | ICD-10-CM | POA: Diagnosis not present

## 2020-03-28 DIAGNOSIS — Z79899 Other long term (current) drug therapy: Secondary | ICD-10-CM | POA: Diagnosis not present

## 2020-03-28 DIAGNOSIS — K2210 Ulcer of esophagus without bleeding: Secondary | ICD-10-CM | POA: Diagnosis not present

## 2020-03-28 DIAGNOSIS — I10 Essential (primary) hypertension: Secondary | ICD-10-CM | POA: Diagnosis not present

## 2020-03-28 DIAGNOSIS — D696 Thrombocytopenia, unspecified: Secondary | ICD-10-CM | POA: Diagnosis not present

## 2020-03-28 DIAGNOSIS — K222 Esophageal obstruction: Secondary | ICD-10-CM | POA: Diagnosis not present

## 2020-03-28 DIAGNOSIS — N183 Chronic kidney disease, stage 3 unspecified: Secondary | ICD-10-CM | POA: Diagnosis not present

## 2020-03-28 DIAGNOSIS — Z833 Family history of diabetes mellitus: Secondary | ICD-10-CM | POA: Diagnosis not present

## 2020-03-28 DIAGNOSIS — R61 Generalized hyperhidrosis: Secondary | ICD-10-CM | POA: Insufficient documentation

## 2020-03-28 DIAGNOSIS — Z885 Allergy status to narcotic agent status: Secondary | ICD-10-CM | POA: Diagnosis not present

## 2020-03-28 DIAGNOSIS — E039 Hypothyroidism, unspecified: Secondary | ICD-10-CM | POA: Diagnosis not present

## 2020-03-28 DIAGNOSIS — C8590 Non-Hodgkin lymphoma, unspecified, unspecified site: Secondary | ICD-10-CM | POA: Diagnosis not present

## 2020-03-28 DIAGNOSIS — Z8249 Family history of ischemic heart disease and other diseases of the circulatory system: Secondary | ICD-10-CM | POA: Diagnosis not present

## 2020-03-28 DIAGNOSIS — C8514 Unspecified B-cell lymphoma, lymph nodes of axilla and upper limb: Secondary | ICD-10-CM | POA: Insufficient documentation

## 2020-03-28 DIAGNOSIS — I129 Hypertensive chronic kidney disease with stage 1 through stage 4 chronic kidney disease, or unspecified chronic kidney disease: Secondary | ICD-10-CM | POA: Diagnosis not present

## 2020-03-28 DIAGNOSIS — Z83438 Family history of other disorder of lipoprotein metabolism and other lipidemia: Secondary | ICD-10-CM | POA: Diagnosis not present

## 2020-03-28 DIAGNOSIS — K2100 Gastro-esophageal reflux disease with esophagitis, without bleeding: Secondary | ICD-10-CM | POA: Diagnosis not present

## 2020-03-28 DIAGNOSIS — E118 Type 2 diabetes mellitus with unspecified complications: Secondary | ICD-10-CM | POA: Diagnosis not present

## 2020-03-28 HISTORY — DX: Presence of other vascular implants and grafts: Z95.828

## 2020-03-28 LAB — CMP (CANCER CENTER ONLY)
ALT: 10 U/L (ref 0–44)
AST: 16 U/L (ref 15–41)
Albumin: 3.8 g/dL (ref 3.5–5.0)
Alkaline Phosphatase: 65 U/L (ref 38–126)
Anion gap: 12 (ref 5–15)
BUN: 22 mg/dL (ref 8–23)
CO2: 20 mmol/L — ABNORMAL LOW (ref 22–32)
Calcium: 9.3 mg/dL (ref 8.9–10.3)
Chloride: 101 mmol/L (ref 98–111)
Creatinine: 1.41 mg/dL — ABNORMAL HIGH (ref 0.44–1.00)
GFR, Est AFR Am: 42 mL/min — ABNORMAL LOW (ref >60)
GFR, Estimated: 36 mL/min — ABNORMAL LOW (ref >60)
Glucose, Bld: 135 mg/dL — ABNORMAL HIGH (ref 70–99)
Potassium: 3.7 mmol/L (ref 3.5–5.1)
Sodium: 133 mmol/L — ABNORMAL LOW (ref 135–145)
Total Bilirubin: 0.5 mg/dL (ref 0.3–1.2)
Total Protein: 6.9 g/dL (ref 6.5–8.1)

## 2020-03-28 LAB — CBC WITH DIFFERENTIAL (CANCER CENTER ONLY)
Abs Immature Granulocytes: 0.04 10*3/uL (ref 0.00–0.07)
Basophils Absolute: 0 10*3/uL (ref 0.0–0.1)
Basophils Relative: 1 %
Eosinophils Absolute: 0.2 10*3/uL (ref 0.0–0.5)
Eosinophils Relative: 4 %
HCT: 31.4 % — ABNORMAL LOW (ref 36.0–46.0)
Hemoglobin: 10.4 g/dL — ABNORMAL LOW (ref 12.0–15.0)
Immature Granulocytes: 1 %
Lymphocytes Relative: 24 %
Lymphs Abs: 1.4 10*3/uL (ref 0.7–4.0)
MCH: 26.6 pg (ref 26.0–34.0)
MCHC: 33.1 g/dL (ref 30.0–36.0)
MCV: 80.3 fL (ref 80.0–100.0)
Monocytes Absolute: 0.7 10*3/uL (ref 0.1–1.0)
Monocytes Relative: 12 %
Neutro Abs: 3.4 10*3/uL (ref 1.7–7.7)
Neutrophils Relative %: 58 %
Platelet Count: 106 10*3/uL — ABNORMAL LOW (ref 150–400)
RBC: 3.91 MIL/uL (ref 3.87–5.11)
RDW: 14.6 % (ref 11.5–15.5)
WBC Count: 5.8 10*3/uL (ref 4.0–10.5)
nRBC: 0 % (ref 0.0–0.2)

## 2020-03-28 LAB — LACTATE DEHYDROGENASE: LDH: 212 U/L — ABNORMAL HIGH (ref 98–192)

## 2020-03-28 MED ORDER — SODIUM CHLORIDE 0.9 % IV SOLN
90.0000 mg/m2 | Freq: Once | INTRAVENOUS | Status: AC
  Administered 2020-03-28: 150 mg via INTRAVENOUS
  Filled 2020-03-28: qty 6

## 2020-03-28 MED ORDER — DIPHENHYDRAMINE HCL 25 MG PO CAPS
50.0000 mg | ORAL_CAPSULE | Freq: Once | ORAL | Status: AC
  Administered 2020-03-28: 50 mg via ORAL

## 2020-03-28 MED ORDER — ACETAMINOPHEN 325 MG PO TABS
ORAL_TABLET | ORAL | Status: AC
  Filled 2020-03-28: qty 2

## 2020-03-28 MED ORDER — DIPHENHYDRAMINE HCL 25 MG PO CAPS
ORAL_CAPSULE | ORAL | Status: AC
  Filled 2020-03-28: qty 2

## 2020-03-28 MED ORDER — SODIUM CHLORIDE 0.9% FLUSH
10.0000 mL | INTRAVENOUS | Status: DC | PRN
  Administered 2020-03-28: 10 mL
  Filled 2020-03-28: qty 10

## 2020-03-28 MED ORDER — PALONOSETRON HCL INJECTION 0.25 MG/5ML
INTRAVENOUS | Status: AC
  Filled 2020-03-28: qty 5

## 2020-03-28 MED ORDER — SODIUM CHLORIDE 0.9 % IV SOLN
375.0000 mg/m2 | Freq: Once | INTRAVENOUS | Status: AC
  Administered 2020-03-28: 600 mg via INTRAVENOUS
  Filled 2020-03-28: qty 10

## 2020-03-28 MED ORDER — LORAZEPAM 2 MG/ML IJ SOLN
INTRAMUSCULAR | Status: AC
  Filled 2020-03-28: qty 1

## 2020-03-28 MED ORDER — HEPARIN SOD (PORK) LOCK FLUSH 100 UNIT/ML IV SOLN
500.0000 [IU] | Freq: Once | INTRAVENOUS | Status: AC | PRN
  Administered 2020-03-28: 500 [IU]
  Filled 2020-03-28: qty 5

## 2020-03-28 MED ORDER — PALONOSETRON HCL INJECTION 0.25 MG/5ML
0.2500 mg | Freq: Once | INTRAVENOUS | Status: AC
  Administered 2020-03-28: 0.25 mg via INTRAVENOUS

## 2020-03-28 MED ORDER — LORAZEPAM 2 MG/ML IJ SOLN
0.5000 mg | Freq: Once | INTRAMUSCULAR | Status: AC
  Administered 2020-03-28: 0.5 mg via INTRAVENOUS

## 2020-03-28 MED ORDER — SODIUM CHLORIDE 0.9 % IV SOLN
Freq: Once | INTRAVENOUS | Status: AC
  Filled 2020-03-28: qty 250

## 2020-03-28 MED ORDER — ACETAMINOPHEN 325 MG PO TABS
650.0000 mg | ORAL_TABLET | Freq: Once | ORAL | Status: AC
  Administered 2020-03-28: 650 mg via ORAL

## 2020-03-28 MED ORDER — SODIUM CHLORIDE 0.9 % IV SOLN
10.0000 mg | Freq: Once | INTRAVENOUS | Status: AC
  Administered 2020-03-28: 10 mg via INTRAVENOUS
  Filled 2020-03-28: qty 10

## 2020-03-28 NOTE — Progress Notes (Signed)
Rapid Infusion Rituximab Pharmacist Evaluation  RABECCA BIRGE is a 79 y.o. female being treated with rituximab for marginal zone lymphoma. This patient may be considered for RIR.   A pharmacist has verified the patient tolerated rituximab infusions per the [facility] standard infusion protocol without grade 3-4 infusion reactions. The treatment plan will be updated to reflect RIR if the patient qualifies per the checklist below:   Age > 55 years old Yes   Clinically significant cardiovascular disease No   Circulating lymphocyte count < 5000/uL prior to cycle two Yes  Lab Results  Component Value Date   LYMPHSABS 1.4 03/28/2020    Prior documented grade 3-4 infusion reaction to rituximab No   Prior documented grade 1-2 infusion reaction to rituximab (If YES, Pharmacist will confirm with Physician if patient is still a candidate for RIR) No   Previous rituximab infusion within the past 6 months Yes   Treatment Plan updated orders to reflect RIR Yes    ERCIA CRISAFULLI does meet the criteria for Rapid Infusion Rituximab. This patient is going to be switched to rapid infusion rituximab.   Patient had restless legs after diphenhydramine premedication with C1. Per Dr. Lorenso Courier, okay to change to loratadine for future treatments.  Demetrius Charity, PharmD, BCPS, [place] [facility] Pharmacist Pharmacy Phone: [phone] 03/28/2020

## 2020-03-28 NOTE — Patient Instructions (Signed)
[facility] Discharge Instructions for Patients Receiving Chemotherapy  Today you received the following chemotherapy agents Rituxan, Bendeka To help prevent nausea and vomiting after your treatment, we encourage you to take your nausea medication. DO NOT TAKE ZOFRAN FOR THREE DAYS AFTER TREATMENT.   If you develop nausea and vomiting that is not controlled by your nausea medication, call the clinic.   BELOW ARE SYMPTOMS THAT SHOULD BE REPORTED IMMEDIATELY:  *FEVER GREATER THAN 100.5 F  *CHILLS WITH OR WITHOUT FEVER  NAUSEA AND VOMITING THAT IS NOT CONTROLLED WITH YOUR NAUSEA MEDICATION  *UNUSUAL SHORTNESS OF BREATH  *UNUSUAL BRUISING OR BLEEDING  TENDERNESS IN MOUTH AND THROAT WITH OR WITHOUT PRESENCE OF ULCERS  *URINARY PROBLEMS  *BOWEL PROBLEMS  UNUSUAL RASH Items with * indicate a potential emergency and should be followed up as soon as possible.  Feel free to call the clinic should you have any questions or concerns. The clinic phone number is [phone].  Please show the [place] [facility] at check-in to the Emergency Department and triage nurse.  Bendamustine Injection What is this medicine? BENDAMUSTINE (BEN da MUS teen) is a chemotherapy drug. It is used to treat chronic lymphocytic leukemia and non-Hodgkin lymphoma. This medicine may be used for other purposes; ask your health care provider or pharmacist if you have questions. COMMON BRAND NAME(S): Kristine Royal, Treanda What should I tell my health care provider before I take this medicine? They need to know if you have any of these conditions:  infection (especially a virus infection such as chickenpox, cold sores, or herpes)  kidney disease  liver disease  an unusual or allergic reaction to bendamustine, mannitol, other medicines, foods, dyes, or preservatives  pregnant or trying to get pregnant  breast-feeding How should I use this medicine? This medicine is for infusion into a  vein. It is given by a health care professional in a hospital or clinic setting. Talk to your pediatrician regarding the use of this medicine in children. Special care may be needed. Overdosage: If you think you have taken too much of this medicine contact a poison control center or emergency room at once. NOTE: This medicine is only for you. Do not share this medicine with others. What if I miss a dose? It is important not to miss your dose. Call your doctor or health care professional if you are unable to keep an appointment. What may interact with this medicine? Do not take this medicine with any of the following medications:  clozapine This medicine may also interact with the following medications:  atazanavir  cimetidine  ciprofloxacin  enoxacin  fluvoxamine  medicines for seizures like carbamazepine and phenobarbital  mexiletine  rifampin  tacrine  thiabendazole  zileuton This list may not describe all possible interactions. Give your health care provider a list of all the medicines, herbs, non-prescription drugs, or dietary supplements you use. Also tell them if you smoke, drink alcohol, or use illegal drugs. Some items may interact with your medicine. What should I watch for while using this medicine? This drug may make you feel generally unwell. This is not uncommon, as chemotherapy can affect healthy cells as well as cancer cells. Report any side effects. Continue your course of treatment even though you feel ill unless your doctor tells you to stop. You may need blood work done while you are taking this medicine. Call your doctor or healthcare provider for advice if you get a fever, chills or sore throat, or other symptoms  of a cold or flu. Do not treat yourself. This drug decreases your body's ability to fight infections. Try to avoid being around people who are sick. This medicine may cause serious skin reactions. They can happen weeks to months after starting the  medicine. Contact your healthcare provider right away if you notice fevers or flu-like symptoms with a rash. The rash may be red or purple and then turn into blisters or peeling of the skin. Or, you might notice a red rash with swelling of the face, lips or lymph nodes in your neck or under your arms. This medicine may increase your risk to bruise or bleed. Call your doctor or healthcare provider if you notice any unusual bleeding. Talk to your doctor about your risk of cancer. You may be more at risk for certain types of cancers if you take this medicine. Do not become pregnant while taking this medicine or for at least 6 months after stopping it. Women should inform their doctor if they wish to become pregnant or think they might be pregnant. Men should not father a child while taking this medicine and for at least 3 months after stopping it. There is a potential for serious side effects to an unborn child. Talk to your healthcare provider or pharmacist for more information. Do not breast-feed an infant while taking this medicine or for at least 1 week after stopping it. This medicine may make it more difficult to father a child. You should talk with your doctor or healthcare provider if you are concerned about your fertility. What side effects may I notice from receiving this medicine? Side effects that you should report to your doctor or health care professional as soon as possible:  allergic reactions like skin rash, itching or hives, swelling of the face, lips, or tongue  low blood counts - this medicine may decrease the number of white blood cells, red blood cells and platelets. You may be at increased risk for infections and bleeding.  rash, fever, and swollen lymph nodes  redness, blistering, peeling, or loosening of the skin, including inside the mouth  signs of infection like fever or chills, cough, sore throat, pain or difficulty passing urine  signs of decreased platelets or bleeding  like bruising, pinpoint red spots on the skin, black, tarry stools, blood in the urine  signs of decreased red blood cells like being unusually weak or tired, fainting spells, lightheadedness  signs and symptoms of kidney injury like trouble passing urine or change in the amount of urine  signs and symptoms of liver injury like dark yellow or brown urine; general ill feeling or flu-like symptoms; light-colored stools; loss of appetite; nausea; right upper belly pain; unusually weak or tired; yellowing of the eyes or skin Side effects that usually do not require medical attention (report to your doctor or health care professional if they continue or are bothersome):  constipation  decreased appetite  diarrhea  headache  mouth sores  nausea, vomiting  tiredness This list may not describe all possible side effects. Call your doctor for medical advice about side effects. You may report side effects to FDA at 1-800-FDA-1088. Where should I keep my medicine? This drug is given in a hospital or clinic and will not be stored at home. NOTE: This sheet is a summary. It may not cover all possible information. If you have questions about this medicine, talk to your doctor, pharmacist, or health care provider.  2020 Elsevier/Gold Standard (2018-11-23 10:26:46) Rituximab injection What  is this medicine? RITUXIMAB (ri TUX i mab) is a monoclonal antibody. It is used to treat certain types of cancer like non-Hodgkin lymphoma and chronic lymphocytic leukemia. It is also used to treat rheumatoid arthritis, granulomatosis with polyangiitis (or Wegener's granulomatosis), microscopic polyangiitis, and pemphigus vulgaris. This medicine may be used for other purposes; ask your health care provider or pharmacist if you have questions. COMMON BRAND NAME(S): Rituxan, RUXIENCE What should I tell my health care provider before I take this medicine? They need to know if you have any of these conditions:  heart  disease  infection (especially a virus infection such as hepatitis B, chickenpox, cold sores, or herpes)  immune system problems  irregular heartbeat  kidney disease  low blood counts, like low white cell, platelet, or red cell counts  lung or breathing disease, like asthma  recently received or scheduled to receive a vaccine  an unusual or allergic reaction to rituximab, other medicines, foods, dyes, or preservatives  pregnant or trying to get pregnant  breast-feeding How should I use this medicine? This medicine is for infusion into a vein. It is administered in a hospital or clinic by a specially trained health care professional. A special MedGuide will be given to you by the pharmacist with each prescription and refill. Be sure to read this information carefully each time. Talk to your pediatrician regarding the use of this medicine in children. This medicine is not approved for use in children. Overdosage: If you think you have taken too much of this medicine contact a poison control center or emergency room at once. NOTE: This medicine is only for you. Do not share this medicine with others. What if I miss a dose? It is important not to miss a dose. Call your doctor or health care professional if you are unable to keep an appointment. What may interact with this medicine?  cisplatin  live virus vaccines This list may not describe all possible interactions. Give your health care provider a list of all the medicines, herbs, non-prescription drugs, or dietary supplements you use. Also tell them if you smoke, drink alcohol, or use illegal drugs. Some items may interact with your medicine. What should I watch for while using this medicine? Your condition will be monitored carefully while you are receiving this medicine. You may need blood work done while you are taking this medicine. This medicine can cause serious allergic reactions. To reduce your risk you may need to take  medicine before treatment with this medicine. Take your medicine as directed. In some patients, this medicine may cause a serious brain infection that may cause death. If you have any problems seeing, thinking, speaking, walking, or standing, tell your healthcare professional right away. If you cannot reach your healthcare professional, urgently seek other source of medical care. Call your doctor or health care professional for advice if you get a fever, chills or sore throat, or other symptoms of a cold or flu. Do not treat yourself. This drug decreases your body's ability to fight infections. Try to avoid being around people who are sick. Do not become pregnant while taking this medicine or for at least 12 months after stopping it. Women should inform their doctor if they wish to become pregnant or think they might be pregnant. There is a potential for serious side effects to an unborn child. Talk to your health care professional or pharmacist for more information. Do not breast-feed an infant while taking this medicine or for at least 6  months after stopping it. What side effects may I notice from receiving this medicine? Side effects that you should report to your doctor or health care professional as soon as possible:  allergic reactions like skin rash, itching or hives; swelling of the face, lips, or tongue  breathing problems  chest pain  changes in vision  diarrhea  headache with fever, neck stiffness, sensitivity to light, nausea, or confusion  fast, irregular heartbeat  loss of memory  low blood counts - this medicine may decrease the number of white blood cells, red blood cells and platelets. You may be at increased risk for infections and bleeding.  mouth sores  problems with balance, talking, or walking  redness, blistering, peeling or loosening of the skin, including inside the mouth  signs of infection - fever or chills, cough, sore throat, pain or difficulty passing  urine  signs and symptoms of kidney injury like trouble passing urine or change in the amount of urine  signs and symptoms of liver injury like dark yellow or brown urine; general ill feeling or flu-like symptoms; light-colored stools; loss of appetite; nausea; right upper belly pain; unusually weak or tired; yellowing of the eyes or skin  signs and symptoms of low blood pressure like dizziness; feeling faint or lightheaded, falls; unusually weak or tired  stomach pain  swelling of the ankles, feet, hands  unusual bleeding or bruising  vomiting Side effects that usually do not require medical attention (report to your doctor or health care professional if they continue or are bothersome):  headache  joint pain  muscle cramps or muscle pain  nausea  tiredness This list may not describe all possible side effects. Call your doctor for medical advice about side effects. You may report side effects to FDA at 1-800-FDA-1088. Where should I keep my medicine? This drug is given in a hospital or clinic and will not be stored at home. NOTE: This sheet is a summary. It may not cover all possible information. If you have questions about this medicine, talk to your doctor, pharmacist, or health care provider.  2020 Elsevier/Gold Standard (2018-10-13 22:01:36)

## 2020-03-28 NOTE — Progress Notes (Signed)
First time Rituxan/Bendeka. Pt. Tolerated well. Pt did have issues with restless leg after Benedryl 50 mg PO. Ativan 0.5mg  IV given without relief. Dr. Lorenso Courier and pharmacy aware. Prepared to make changes in premeds moving forward. Education completed. Nutrition given.

## 2020-03-28 NOTE — Progress Notes (Signed)
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  Unfortunately there aren't any foundations offering copay assistance for her Dx and the type of ins she has.  I offered the [employer], went over what it covers and gave her an expense sheet.  She declined the grant at this time.  I gave her my card in case she changes her mind in the future.

## 2020-03-29 ENCOUNTER — Other Ambulatory Visit: Payer: Self-pay | Admitting: Hematology and Oncology

## 2020-03-29 ENCOUNTER — Other Ambulatory Visit: Payer: Self-pay

## 2020-03-29 ENCOUNTER — Inpatient Hospital Stay: Payer: Medicare Other

## 2020-03-29 VITALS — BP 151/53 | HR 79 | Temp 97.8°F | Resp 22

## 2020-03-29 DIAGNOSIS — C8580 Other specified types of non-Hodgkin lymphoma, unspecified site: Secondary | ICD-10-CM

## 2020-03-29 MED ORDER — ACETAMINOPHEN 325 MG PO TABS
ORAL_TABLET | ORAL | Status: AC
  Filled 2020-03-29: qty 2

## 2020-03-29 MED ORDER — SODIUM CHLORIDE 0.9 % IV SOLN
10.0000 mg | Freq: Once | INTRAVENOUS | Status: AC
  Administered 2020-03-29: 10 mg via INTRAVENOUS
  Filled 2020-03-29: qty 10

## 2020-03-29 MED ORDER — SODIUM CHLORIDE 0.9% FLUSH
10.0000 mL | INTRAVENOUS | Status: DC | PRN
  Administered 2020-03-29: 10 mL
  Filled 2020-03-29: qty 10

## 2020-03-29 MED ORDER — HEPARIN SOD (PORK) LOCK FLUSH 100 UNIT/ML IV SOLN
500.0000 [IU] | Freq: Once | INTRAVENOUS | Status: AC | PRN
  Administered 2020-03-29: 500 [IU]
  Filled 2020-03-29: qty 5

## 2020-03-29 MED ORDER — ACETAMINOPHEN 325 MG PO TABS
650.0000 mg | ORAL_TABLET | Freq: Once | ORAL | Status: AC
  Administered 2020-03-29: 650 mg via ORAL

## 2020-03-29 MED ORDER — SODIUM CHLORIDE 0.9 % IV SOLN
Freq: Once | INTRAVENOUS | Status: AC
  Filled 2020-03-29: qty 250

## 2020-03-29 MED ORDER — SODIUM CHLORIDE 0.9 % IV SOLN
90.0000 mg/m2 | Freq: Once | INTRAVENOUS | Status: AC
  Administered 2020-03-29: 150 mg via INTRAVENOUS
  Filled 2020-03-29: qty 6

## 2020-03-29 NOTE — Patient Instructions (Signed)
[facility] Discharge Instructions for Patients Receiving Chemotherapy  Today you received the following chemotherapy agents Bendeka.  To help prevent nausea and vomiting after your treatment, we encourage you to take your nausea medication as directed.   If you develop nausea and vomiting that is not controlled by your nausea medication, call the clinic.   BELOW ARE SYMPTOMS THAT SHOULD BE REPORTED IMMEDIATELY:  *FEVER GREATER THAN 100.5 F  *CHILLS WITH OR WITHOUT FEVER  NAUSEA AND VOMITING THAT IS NOT CONTROLLED WITH YOUR NAUSEA MEDICATION  *UNUSUAL SHORTNESS OF BREATH  *UNUSUAL BRUISING OR BLEEDING  TENDERNESS IN MOUTH AND THROAT WITH OR WITHOUT PRESENCE OF ULCERS  *URINARY PROBLEMS  *BOWEL PROBLEMS  UNUSUAL RASH Items with * indicate a potential emergency and should be followed up as soon as possible.  Feel free to call the clinic should you have any questions or concerns. The clinic phone number is [phone].  Please show the CHEMO ALERT CARD at check-in to the Emergency Department and triage nurse.   

## 2020-03-30 ENCOUNTER — Encounter (HOSPITAL_COMMUNITY): Payer: Self-pay

## 2020-03-30 ENCOUNTER — Encounter (HOSPITAL_COMMUNITY)
Admission: EM | Disposition: A | Discharge status: Home / Self Care | Payer: Self-pay | Source: Home / Self Care | Attending: Internal Medicine

## 2020-03-30 ENCOUNTER — Observation Stay (HOSPITAL_COMMUNITY): Payer: Medicare Other | Admitting: Certified Registered"

## 2020-03-30 ENCOUNTER — Other Ambulatory Visit: Payer: Self-pay

## 2020-03-30 ENCOUNTER — Emergency Department (HOSPITAL_COMMUNITY): Payer: Medicare Other

## 2020-03-30 ENCOUNTER — Inpatient Hospital Stay (HOSPITAL_COMMUNITY)
Admission: EM | Admit: 2020-03-30 | Discharge: 2020-04-01 | DRG: 381 | Disposition: A | Discharge status: Home / Self Care | Payer: Medicare Other | Attending: Internal Medicine | Admitting: Internal Medicine

## 2020-03-30 ENCOUNTER — Other Ambulatory Visit: Payer: Self-pay | Admitting: *Deleted

## 2020-03-30 DIAGNOSIS — E1169 Type 2 diabetes mellitus with other specified complication: Secondary | ICD-10-CM | POA: Diagnosis present

## 2020-03-30 DIAGNOSIS — N183 Chronic kidney disease, stage 3 unspecified: Secondary | ICD-10-CM | POA: Diagnosis present

## 2020-03-30 DIAGNOSIS — Z809 Family history of malignant neoplasm, unspecified: Secondary | ICD-10-CM

## 2020-03-30 DIAGNOSIS — K920 Hematemesis: Secondary | ICD-10-CM | POA: Diagnosis present

## 2020-03-30 DIAGNOSIS — Z20822 Contact with and (suspected) exposure to covid-19: Secondary | ICD-10-CM | POA: Diagnosis present

## 2020-03-30 DIAGNOSIS — Z8673 Personal history of transient ischemic attack (TIA), and cerebral infarction without residual deficits: Secondary | ICD-10-CM

## 2020-03-30 DIAGNOSIS — R079 Chest pain, unspecified: Secondary | ICD-10-CM

## 2020-03-30 DIAGNOSIS — I10 Essential (primary) hypertension: Secondary | ICD-10-CM | POA: Diagnosis present

## 2020-03-30 DIAGNOSIS — E119 Type 2 diabetes mellitus without complications: Secondary | ICD-10-CM | POA: Diagnosis present

## 2020-03-30 DIAGNOSIS — Z885 Allergy status to narcotic agent status: Secondary | ICD-10-CM

## 2020-03-30 DIAGNOSIS — E1122 Type 2 diabetes mellitus with diabetic chronic kidney disease: Secondary | ICD-10-CM | POA: Diagnosis present

## 2020-03-30 DIAGNOSIS — K2211 Ulcer of esophagus with bleeding: Principal | ICD-10-CM | POA: Diagnosis present

## 2020-03-30 DIAGNOSIS — Z83438 Family history of other disorder of lipoprotein metabolism and other lipidemia: Secondary | ICD-10-CM

## 2020-03-30 DIAGNOSIS — Z91041 Radiographic dye allergy status: Secondary | ICD-10-CM

## 2020-03-30 DIAGNOSIS — C8580 Other specified types of non-Hodgkin lymphoma, unspecified site: Secondary | ICD-10-CM | POA: Diagnosis present

## 2020-03-30 DIAGNOSIS — R1010 Upper abdominal pain, unspecified: Secondary | ICD-10-CM

## 2020-03-30 DIAGNOSIS — E782 Mixed hyperlipidemia: Secondary | ICD-10-CM | POA: Diagnosis present

## 2020-03-30 DIAGNOSIS — M8580 Other specified disorders of bone density and structure, unspecified site: Secondary | ICD-10-CM | POA: Diagnosis present

## 2020-03-30 DIAGNOSIS — I129 Hypertensive chronic kidney disease with stage 1 through stage 4 chronic kidney disease, or unspecified chronic kidney disease: Secondary | ICD-10-CM | POA: Diagnosis present

## 2020-03-30 DIAGNOSIS — Z8249 Family history of ischemic heart disease and other diseases of the circulatory system: Secondary | ICD-10-CM

## 2020-03-30 DIAGNOSIS — M533 Sacrococcygeal disorders, not elsewhere classified: Secondary | ICD-10-CM | POA: Diagnosis present

## 2020-03-30 DIAGNOSIS — Z833 Family history of diabetes mellitus: Secondary | ICD-10-CM

## 2020-03-30 DIAGNOSIS — Z79890 Hormone replacement therapy (postmenopausal): Secondary | ICD-10-CM

## 2020-03-30 DIAGNOSIS — E039 Hypothyroidism, unspecified: Secondary | ICD-10-CM | POA: Diagnosis present

## 2020-03-30 DIAGNOSIS — C8510 Unspecified B-cell lymphoma, unspecified site: Secondary | ICD-10-CM | POA: Diagnosis present

## 2020-03-30 DIAGNOSIS — K2100 Gastro-esophageal reflux disease with esophagitis, without bleeding: Secondary | ICD-10-CM | POA: Diagnosis present

## 2020-03-30 DIAGNOSIS — Z8261 Family history of arthritis: Secondary | ICD-10-CM

## 2020-03-30 DIAGNOSIS — N2581 Secondary hyperparathyroidism of renal origin: Secondary | ICD-10-CM | POA: Diagnosis present

## 2020-03-30 DIAGNOSIS — Z79899 Other long term (current) drug therapy: Secondary | ICD-10-CM

## 2020-03-30 DIAGNOSIS — J4520 Mild intermittent asthma, uncomplicated: Secondary | ICD-10-CM | POA: Diagnosis present

## 2020-03-30 DIAGNOSIS — E118 Type 2 diabetes mellitus with unspecified complications: Secondary | ICD-10-CM | POA: Diagnosis present

## 2020-03-30 DIAGNOSIS — N1831 Chronic kidney disease, stage 3a: Secondary | ICD-10-CM | POA: Diagnosis present

## 2020-03-30 HISTORY — PX: BIOPSY: SHX5522

## 2020-03-30 HISTORY — DX: Hematemesis: K92.0

## 2020-03-30 HISTORY — PX: ESOPHAGOGASTRODUODENOSCOPY: SHX5428

## 2020-03-30 LAB — CBC
HCT: 31.1 % — ABNORMAL LOW (ref 36.0–46.0)
Hemoglobin: 10.6 g/dL — ABNORMAL LOW (ref 12.0–15.0)
MCH: 27.2 pg (ref 26.0–34.0)
MCHC: 34.1 g/dL (ref 30.0–36.0)
MCV: 79.9 fL — ABNORMAL LOW (ref 80.0–100.0)
Platelets: 124 10*3/uL — ABNORMAL LOW (ref 150–400)
RBC: 3.89 MIL/uL (ref 3.87–5.11)
RDW: 14.7 % (ref 11.5–15.5)
WBC: 13.3 10*3/uL — ABNORMAL HIGH (ref 4.0–10.5)
nRBC: 0 % (ref 0.0–0.2)

## 2020-03-30 LAB — COMPREHENSIVE METABOLIC PANEL
ALT: 12 U/L (ref 0–44)
AST: 25 U/L (ref 15–41)
Albumin: 4.4 g/dL (ref 3.5–5.0)
Alkaline Phosphatase: 56 U/L (ref 38–126)
Anion gap: 13 (ref 5–15)
BUN: 30 mg/dL — ABNORMAL HIGH (ref 8–23)
CO2: 21 mmol/L — ABNORMAL LOW (ref 22–32)
Calcium: 9.4 mg/dL (ref 8.9–10.3)
Chloride: 95 mmol/L — ABNORMAL LOW (ref 98–111)
Creatinine, Ser: 1.48 mg/dL — ABNORMAL HIGH (ref 0.44–1.00)
GFR calc Af Amer: 39 mL/min — ABNORMAL LOW (ref >60)
GFR calc non Af Amer: 34 mL/min — ABNORMAL LOW (ref >60)
Glucose, Bld: 220 mg/dL — ABNORMAL HIGH (ref 70–99)
Potassium: 3.8 mmol/L (ref 3.5–5.1)
Sodium: 129 mmol/L — ABNORMAL LOW (ref 135–145)
Total Bilirubin: 0.6 mg/dL (ref 0.3–1.2)
Total Protein: 7.4 g/dL (ref 6.5–8.1)

## 2020-03-30 LAB — URINALYSIS, ROUTINE W REFLEX MICROSCOPIC
Bilirubin Urine: NEGATIVE
Glucose, UA: NEGATIVE mg/dL
Hgb urine dipstick: NEGATIVE
Ketones, ur: NEGATIVE mg/dL
Leukocytes,Ua: NEGATIVE
Nitrite: NEGATIVE
Protein, ur: NEGATIVE mg/dL
Specific Gravity, Urine: 1.013 (ref 1.005–1.030)
pH: 6 (ref 5.0–8.0)

## 2020-03-30 LAB — HEMOGLOBIN A1C
Hgb A1c MFr Bld: 6 % — ABNORMAL HIGH (ref 4.8–5.6)
Mean Plasma Glucose: 125.5 mg/dL

## 2020-03-30 LAB — GLUCOSE, CAPILLARY
Glucose-Capillary: 220 mg/dL — ABNORMAL HIGH (ref 70–99)
Glucose-Capillary: 243 mg/dL — ABNORMAL HIGH (ref 70–99)

## 2020-03-30 LAB — TROPONIN I (HIGH SENSITIVITY)
Troponin I (High Sensitivity): 4 ng/L (ref <18)
Troponin I (High Sensitivity): 5 ng/L (ref <18)

## 2020-03-30 LAB — PROTIME-INR
INR: 1.1 (ref 0.8–1.2)
Prothrombin Time: 13.4 seconds (ref 11.4–15.2)

## 2020-03-30 LAB — CBG MONITORING, ED: Glucose-Capillary: 216 mg/dL — ABNORMAL HIGH (ref 70–99)

## 2020-03-30 LAB — LIPASE, BLOOD: Lipase: 25 U/L (ref 11–51)

## 2020-03-30 LAB — SARS CORONAVIRUS 2 BY RT PCR (HOSPITAL ORDER, PERFORMED IN ~~LOC~~ HOSPITAL LAB): SARS Coronavirus 2: NEGATIVE

## 2020-03-30 IMAGING — CT CT ANGIO CHEST
2 of 6 series · 14 of 46 positions shown · IV contrast (OMNIPAQUE 350)
Comparison: CT the chest, abdomen and pelvis [DATE].

CLINICAL DATA: 78-year-old female with history of severe chest pain
and hemoptysis. Nausea and vomiting. Blood in emesis. Recent history
of chemotherapy for lymphoma.

EXAM:
CT ANGIOGRAPHY CHEST
CT ABDOMEN AND PELVIS WITH CONTRAST
TECHNIQUE: Multidetector CT imaging of the chest was performed using the
standard protocol during bolus administration of intravenous
contrast. Multiplanar CT image reconstructions and MIPs were
obtained to evaluate the vascular anatomy. Multidetector CT imaging
of the abdomen and pelvis was performed using the standard protocol
during bolus administration of intravenous contrast.
CONTRAST:  75mL OMNIPAQUE IOHEXOL 350 MG/ML SOLN

[Series 5: thins · axial · 0.66mm/px · z∈[+1621,+1768]mm · 11 of 170 slices shown]
[im 15/170  lung]
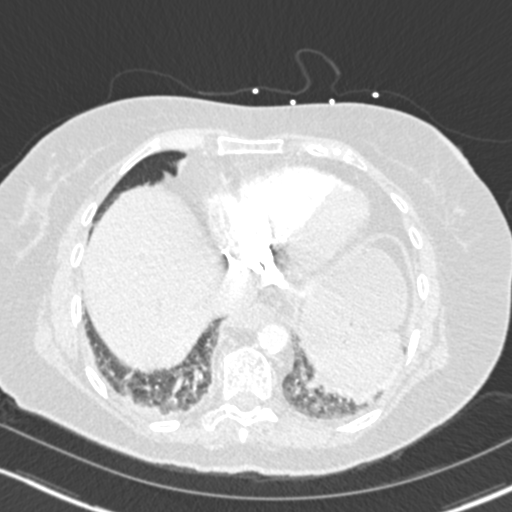
[im 30/170  soft-tissue]
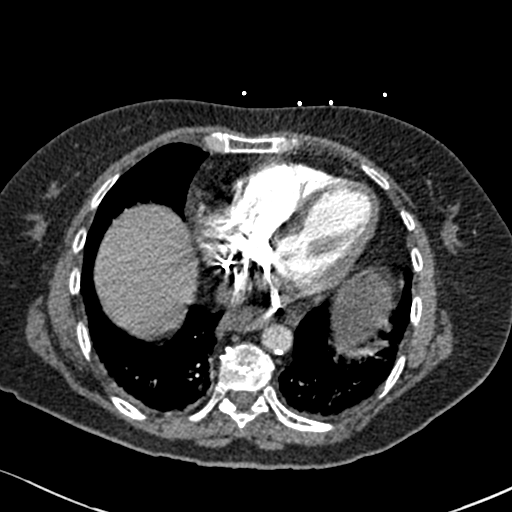
[im 45/170  lung]
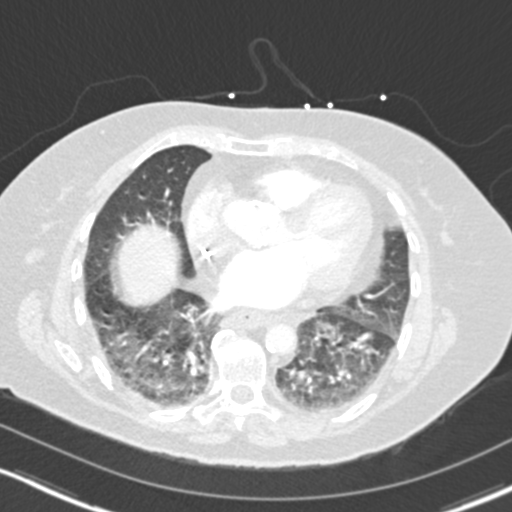
[im 59/170  soft-tissue]
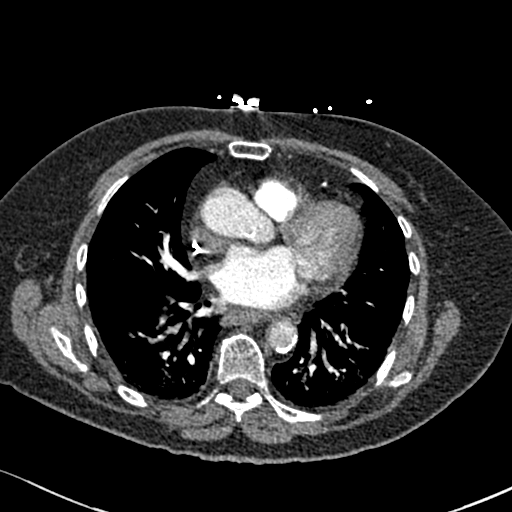
[im 74/170  lung]
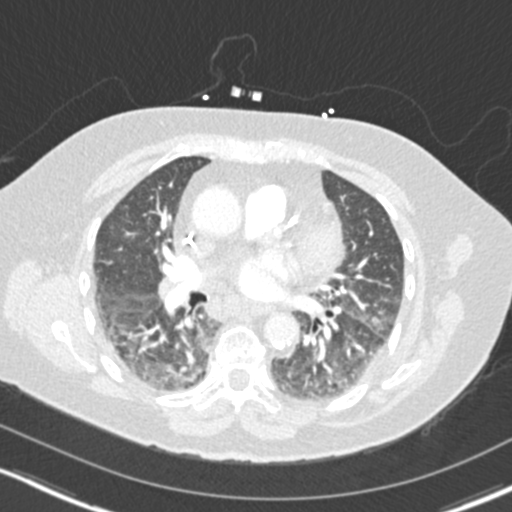
[im 89/170  soft-tissue]
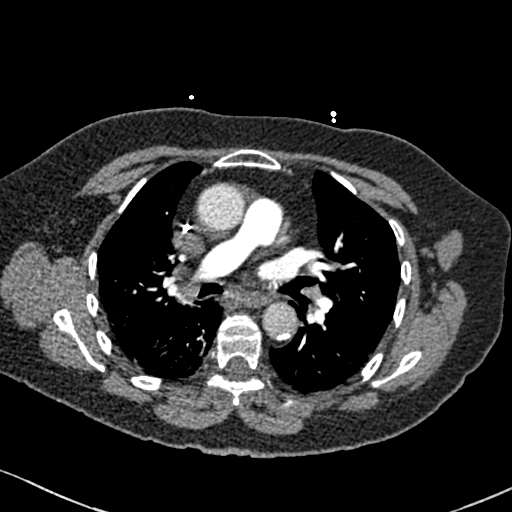
[im 103/170  lung]
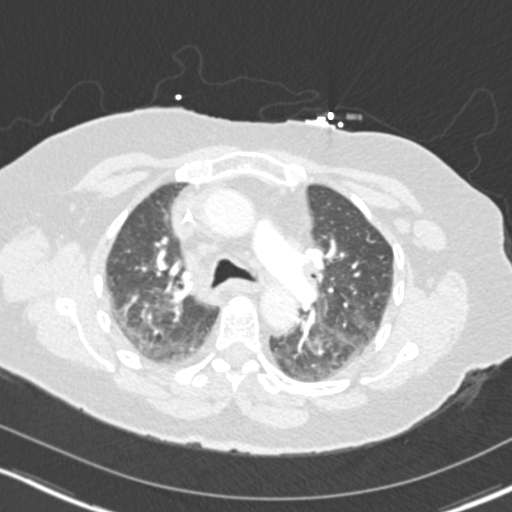
[im 118/170  soft-tissue]
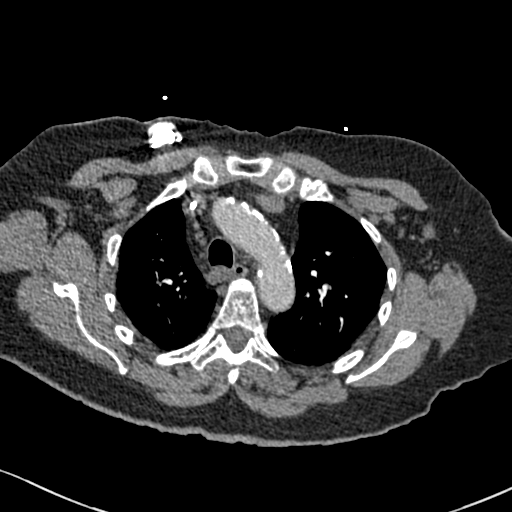
[im 133/170  lung]
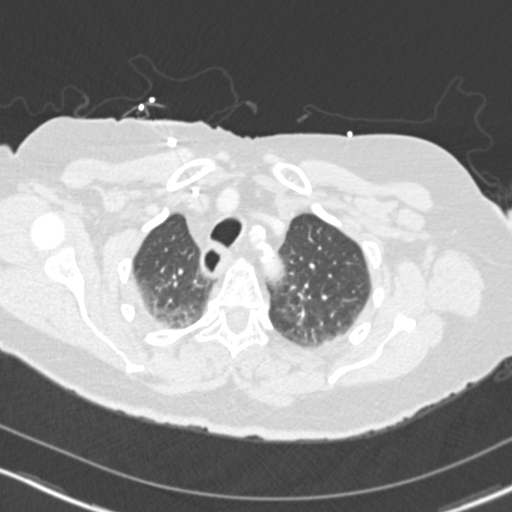
[im 147/170  soft-tissue]
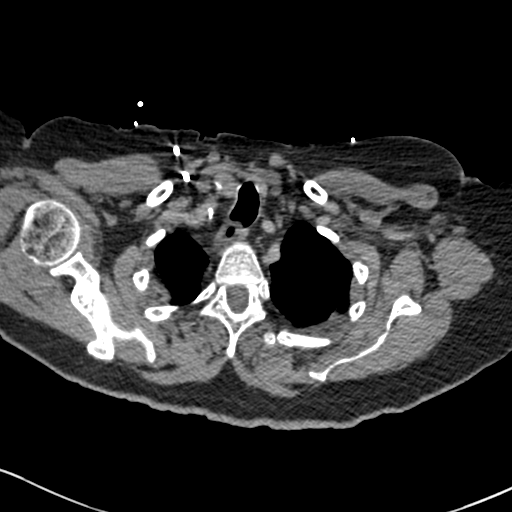
[im 162/170  lung]
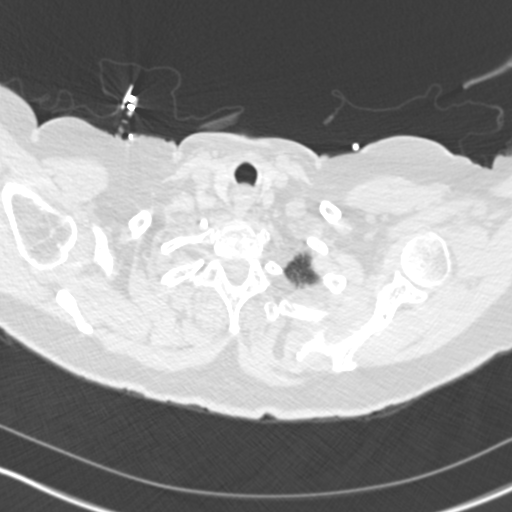

[Series 7: coronal mpr · coronal · 0.33mm/px · 3 of 120 slices shown]
[im 30/120  soft-tissue]
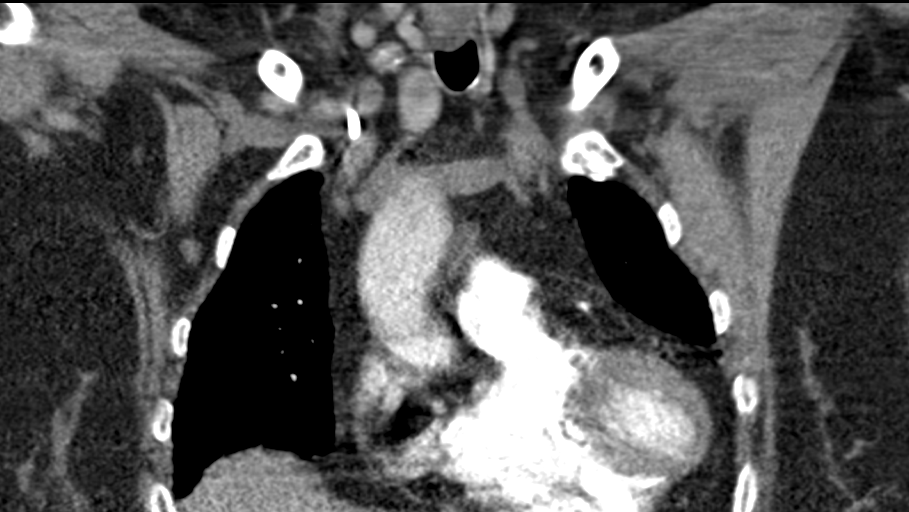
[im 60/120  soft-tissue]
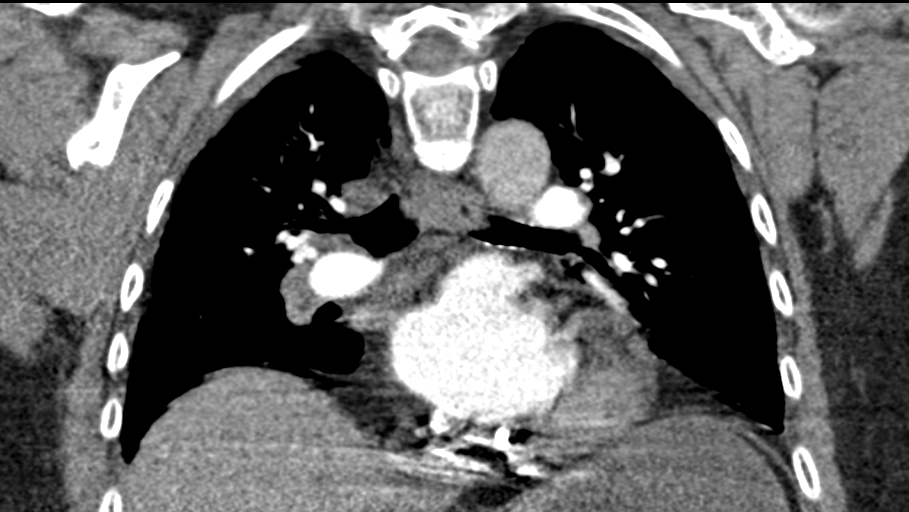
[im 90/120  soft-tissue]
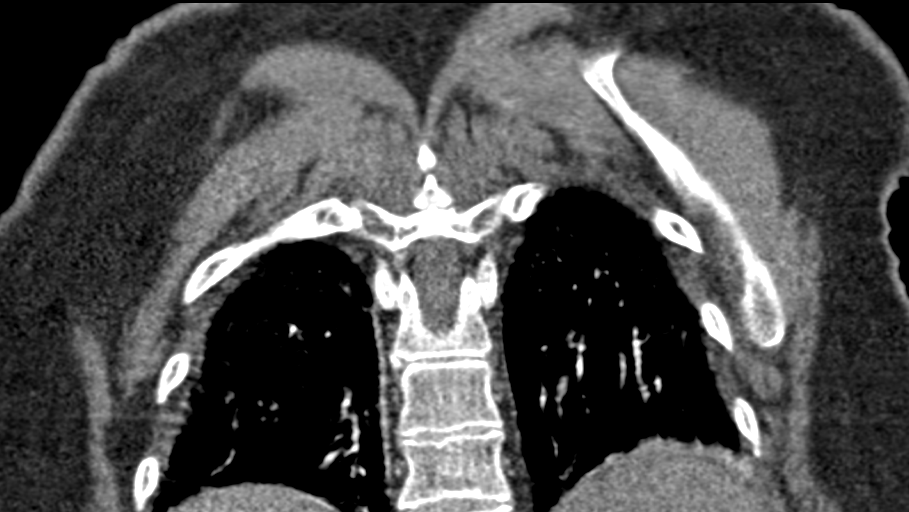

[14 of 46 positions shown; findings below may reference images not displayed]

FINDINGS: CTA CHEST FINDINGS

Cardiovascular: Today's study is limited by patient respiratory
motion. With these limitations in mind, there are no central, lobar
or segmental sized filling defects within the pulmonary arterial
tree to suggest clinically relevant pulmonary embolism. Smaller
distal subsegmental sized pulmonary embolism cannot be entirely
excluded. Heart size is normal. There is no significant pericardial
fluid, thickening or pericardial calcification. There is aortic
atherosclerosis, as well as atherosclerosis of the great vessels of
the mediastinum and the coronary arteries, including calcified
atherosclerotic plaque in the left main and left anterior descending
coronary arteries. Right internal jugular single-lumen porta cath
with tip terminating in the right atrium.

Mediastinum/Nodes: No pathologically enlarged mediastinal or hilar
lymph nodes. Esophagus is unremarkable in appearance. Enlarged left
axillary lymph nodes measuring up to 1.8 cm in short axis. No right
axillary lymphadenopathy.

Lungs/Pleura: No large suspicious appearing pulmonary nodules or
masses are noted. Smaller pulmonary nodules are poorly evaluated on
today's motion limited examination. No confluent consolidative
airspace disease. No pleural effusions. Dependent areas of
atelectasis and/or scarring noted throughout the lungs bilaterally.

Musculoskeletal: There are no aggressive appearing lytic or blastic
lesions noted in the visualized portions of the skeleton.

Review of the MIP images confirms the above findings.

CT ABDOMEN and PELVIS FINDINGS

Hepatobiliary: No suspicious cystic or solid hepatic lesions. No
intra or extrahepatic biliary ductal dilatation. Status post
cholecystectomy.

Pancreas: No pancreatic mass. No pancreatic ductal dilatation. No
pancreatic or peripancreatic fluid collections or inflammatory
changes.

Spleen: Spleen is enlarged measuring 11.0 x 5.6 x 16.4 cm (estimated
splenic volume of 505 mL).

Adrenals/Urinary Tract: Exophytic 1.5 cm low-attenuation lesion in
the lateral aspect of the interpolar region of the right kidney is
compatible with a simple cyst. Left kidney and bilateral adrenal
glands are normal in appearance. No hydroureteronephrosis. Urinary
bladder is normal in appearance.

Stomach/Bowel: Normal appearance of the stomach. No pathologic
dilatation of small bowel or colon. A few scattered colonic
diverticulae are noted, without surrounding inflammatory changes to
suggest an acute diverticulitis at this time. Normal appendix.

Vascular/Lymphatic: Aortic atherosclerosis, without evidence of
aneurysm or dissection in the abdominal or pelvic vasculature. No
lymphadenopathy noted in the abdomen or pelvis.

Reproductive: Status post hysterectomy. Ovaries are not confidently
identified may be surgically absent or atrophic.

Other: No significant volume of ascites.  No pneumoperitoneum.

Musculoskeletal: There are no aggressive appearing lytic or blastic
lesions noted in the visualized portions of the skeleton.

Review of the MIP images confirms the above findings.
IMPRESSION: 1. Despite the limitations of today's examination, there is no
evidence to suggest clinically relevant central, lobar or segmental
sized pulmonary embolism.
2. No acute findings are noted in the abdomen or pelvis to account
for the patient's symptoms.
3. Multiple borderline enlarged and mildly enlarged left axillary
lymph nodes. No lymphadenopathy noted elsewhere in the chest,
abdomen or pelvis.
4. Splenomegaly.

## 2020-03-30 IMAGING — CT CT ABD-PELV W/ CM
2 of 5 series · 13 of 46 positions shown, 15 images · IV contrast (OMNIPAQUE 350)
Comparison: CT the chest, abdomen and pelvis [DATE].

CLINICAL DATA: 78-year-old female with history of severe chest pain
and hemoptysis. Nausea and vomiting. Blood in emesis. Recent history
of chemotherapy for lymphoma.

EXAM:
CT ANGIOGRAPHY CHEST
CT ABDOMEN AND PELVIS WITH CONTRAST
TECHNIQUE: Multidetector CT imaging of the chest was performed using the
standard protocol during bolus administration of intravenous
contrast. Multiplanar CT image reconstructions and MIPs were
obtained to evaluate the vascular anatomy. Multidetector CT imaging
of the abdomen and pelvis was performed using the standard protocol
during bolus administration of intravenous contrast.
CONTRAST:  75mL OMNIPAQUE IOHEXOL 350 MG/ML SOLN

[Series 2: axial st · axial · 0.71mm/px · z∈[+1209,+1629]mm · 10 of 96 slices shown, 12 images]
[im 6/96  soft-tissue]
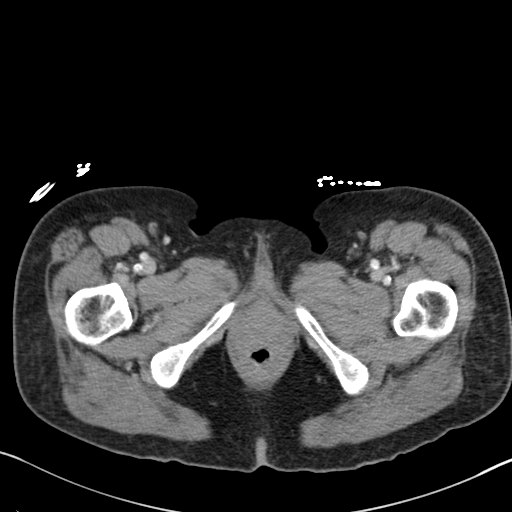
[im 6/96  bone]
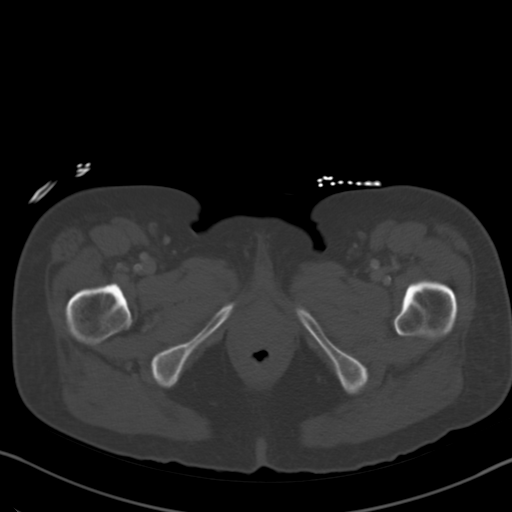
[im 17/96  soft-tissue]
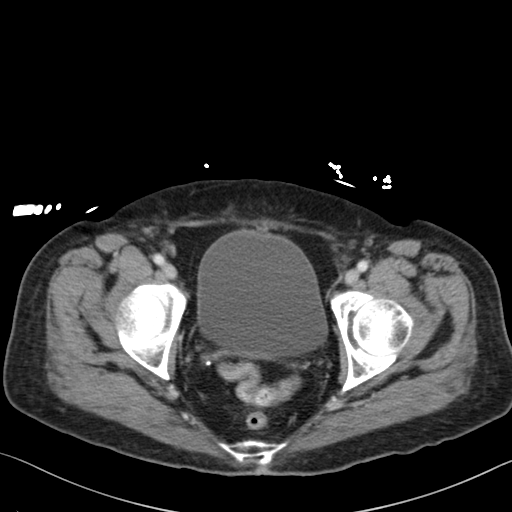
[im 28/96  soft-tissue]
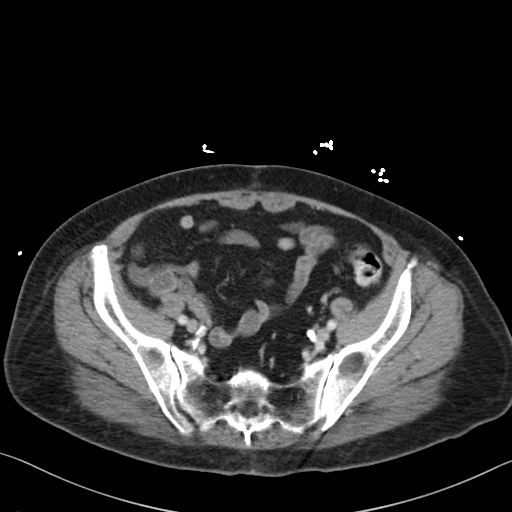
[im 34/96  soft-tissue]
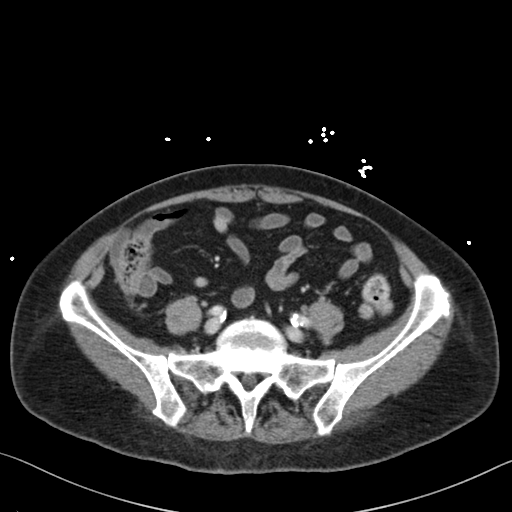
[im 45/96  soft-tissue]
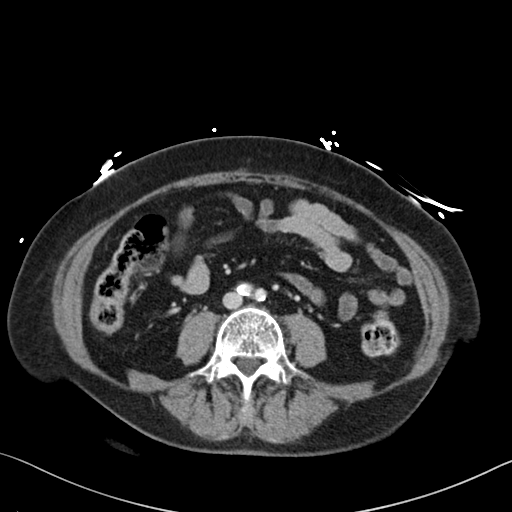
[im 51/96  soft-tissue]
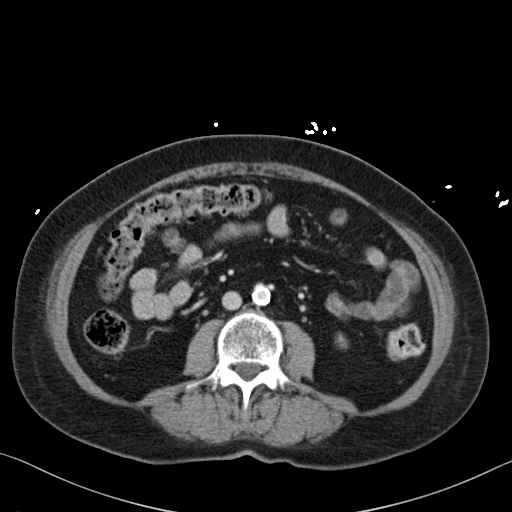
[im 62/96  soft-tissue]
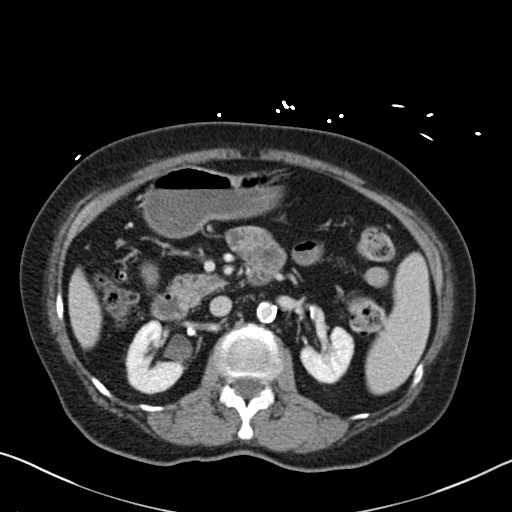
[im 73/96  soft-tissue]
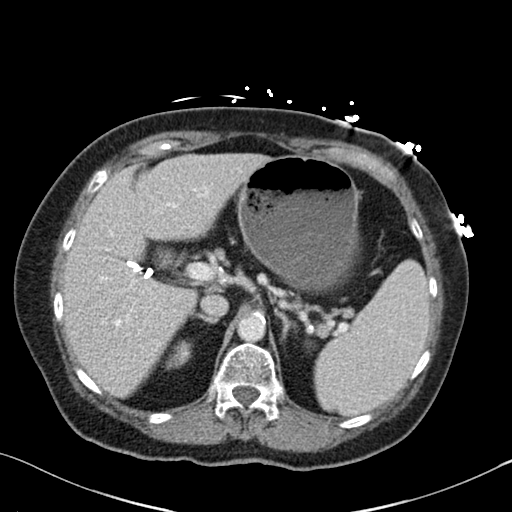
[im 79/96  soft-tissue]
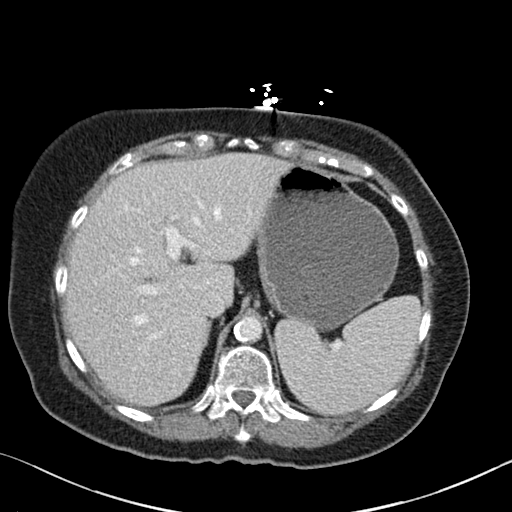
[im 79/96  bone]
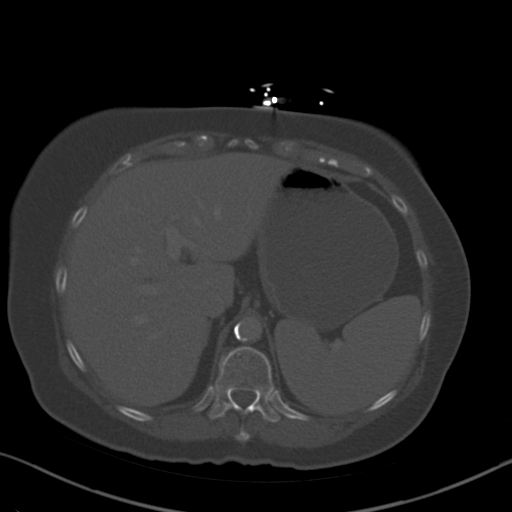
[im 90/96  soft-tissue]
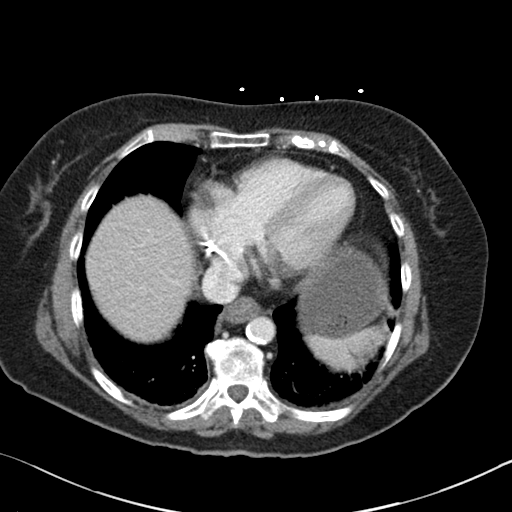

[Series 4: coronal st · coronal · 0.61mm/px · 3 of 88 slices shown]
[im 30/88  soft-tissue]
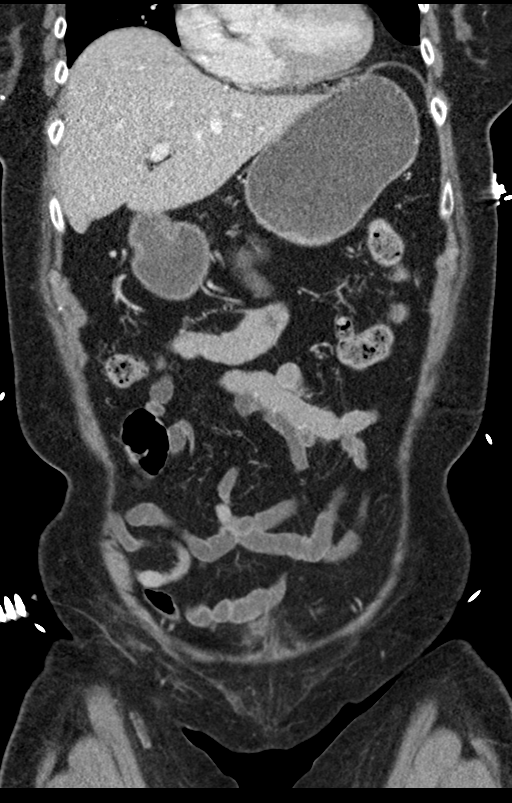
[im 39/88  soft-tissue]
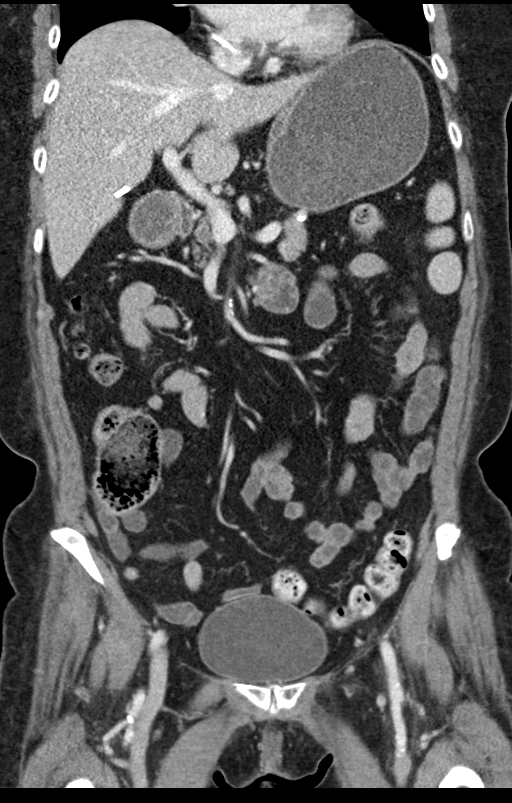
[im 49/88  soft-tissue]
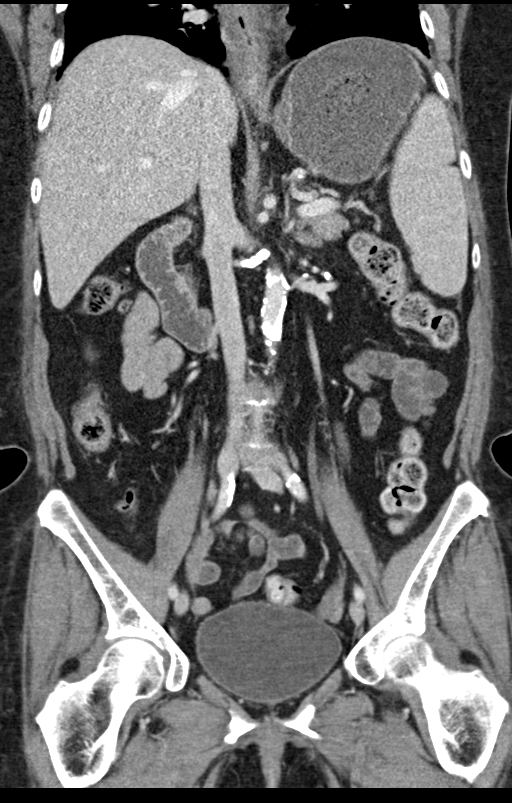

[13 of 46 positions shown; findings below may reference images not displayed]

FINDINGS: CTA CHEST FINDINGS

Cardiovascular: Today's study is limited by patient respiratory
motion. With these limitations in mind, there are no central, lobar
or segmental sized filling defects within the pulmonary arterial
tree to suggest clinically relevant pulmonary embolism. Smaller
distal subsegmental sized pulmonary embolism cannot be entirely
excluded. Heart size is normal. There is no significant pericardial
fluid, thickening or pericardial calcification. There is aortic
atherosclerosis, as well as atherosclerosis of the great vessels of
the mediastinum and the coronary arteries, including calcified
atherosclerotic plaque in the left main and left anterior descending
coronary arteries. Right internal jugular single-lumen porta cath
with tip terminating in the right atrium.

Mediastinum/Nodes: No pathologically enlarged mediastinal or hilar
lymph nodes. Esophagus is unremarkable in appearance. Enlarged left
axillary lymph nodes measuring up to 1.8 cm in short axis. No right
axillary lymphadenopathy.

Lungs/Pleura: No large suspicious appearing pulmonary nodules or
masses are noted. Smaller pulmonary nodules are poorly evaluated on
today's motion limited examination. No confluent consolidative
airspace disease. No pleural effusions. Dependent areas of
atelectasis and/or scarring noted throughout the lungs bilaterally.

Musculoskeletal: There are no aggressive appearing lytic or blastic
lesions noted in the visualized portions of the skeleton.

Review of the MIP images confirms the above findings.

CT ABDOMEN and PELVIS FINDINGS

Hepatobiliary: No suspicious cystic or solid hepatic lesions. No
intra or extrahepatic biliary ductal dilatation. Status post
cholecystectomy.

Pancreas: No pancreatic mass. No pancreatic ductal dilatation. No
pancreatic or peripancreatic fluid collections or inflammatory
changes.

Spleen: Spleen is enlarged measuring 11.0 x 5.6 x 16.4 cm (estimated
splenic volume of 505 mL).

Adrenals/Urinary Tract: Exophytic 1.5 cm low-attenuation lesion in
the lateral aspect of the interpolar region of the right kidney is
compatible with a simple cyst. Left kidney and bilateral adrenal
glands are normal in appearance. No hydroureteronephrosis. Urinary
bladder is normal in appearance.

Stomach/Bowel: Normal appearance of the stomach. No pathologic
dilatation of small bowel or colon. A few scattered colonic
diverticulae are noted, without surrounding inflammatory changes to
suggest an acute diverticulitis at this time. Normal appendix.

Vascular/Lymphatic: Aortic atherosclerosis, without evidence of
aneurysm or dissection in the abdominal or pelvic vasculature. No
lymphadenopathy noted in the abdomen or pelvis.

Reproductive: Status post hysterectomy. Ovaries are not confidently
identified may be surgically absent or atrophic.

Other: No significant volume of ascites.  No pneumoperitoneum.

Musculoskeletal: There are no aggressive appearing lytic or blastic
lesions noted in the visualized portions of the skeleton.

Review of the MIP images confirms the above findings.
IMPRESSION: 1. Despite the limitations of today's examination, there is no
evidence to suggest clinically relevant central, lobar or segmental
sized pulmonary embolism.
2. No acute findings are noted in the abdomen or pelvis to account
for the patient's symptoms.
3. Multiple borderline enlarged and mildly enlarged left axillary
lymph nodes. No lymphadenopathy noted elsewhere in the chest,
abdomen or pelvis.
4. Splenomegaly.

## 2020-03-30 IMAGING — DX DG CHEST 1V PORT
1 series · 1 of 1 positions shown · non-contrast
Comparison: Chest radiographs [DATE] and earlier.

CLINICAL DATA: 78-year-old female with chest pain, recently began
chemotherapy for lymphoma.

EXAM:
PORTABLE CHEST 1 VIEW

[chest ap]
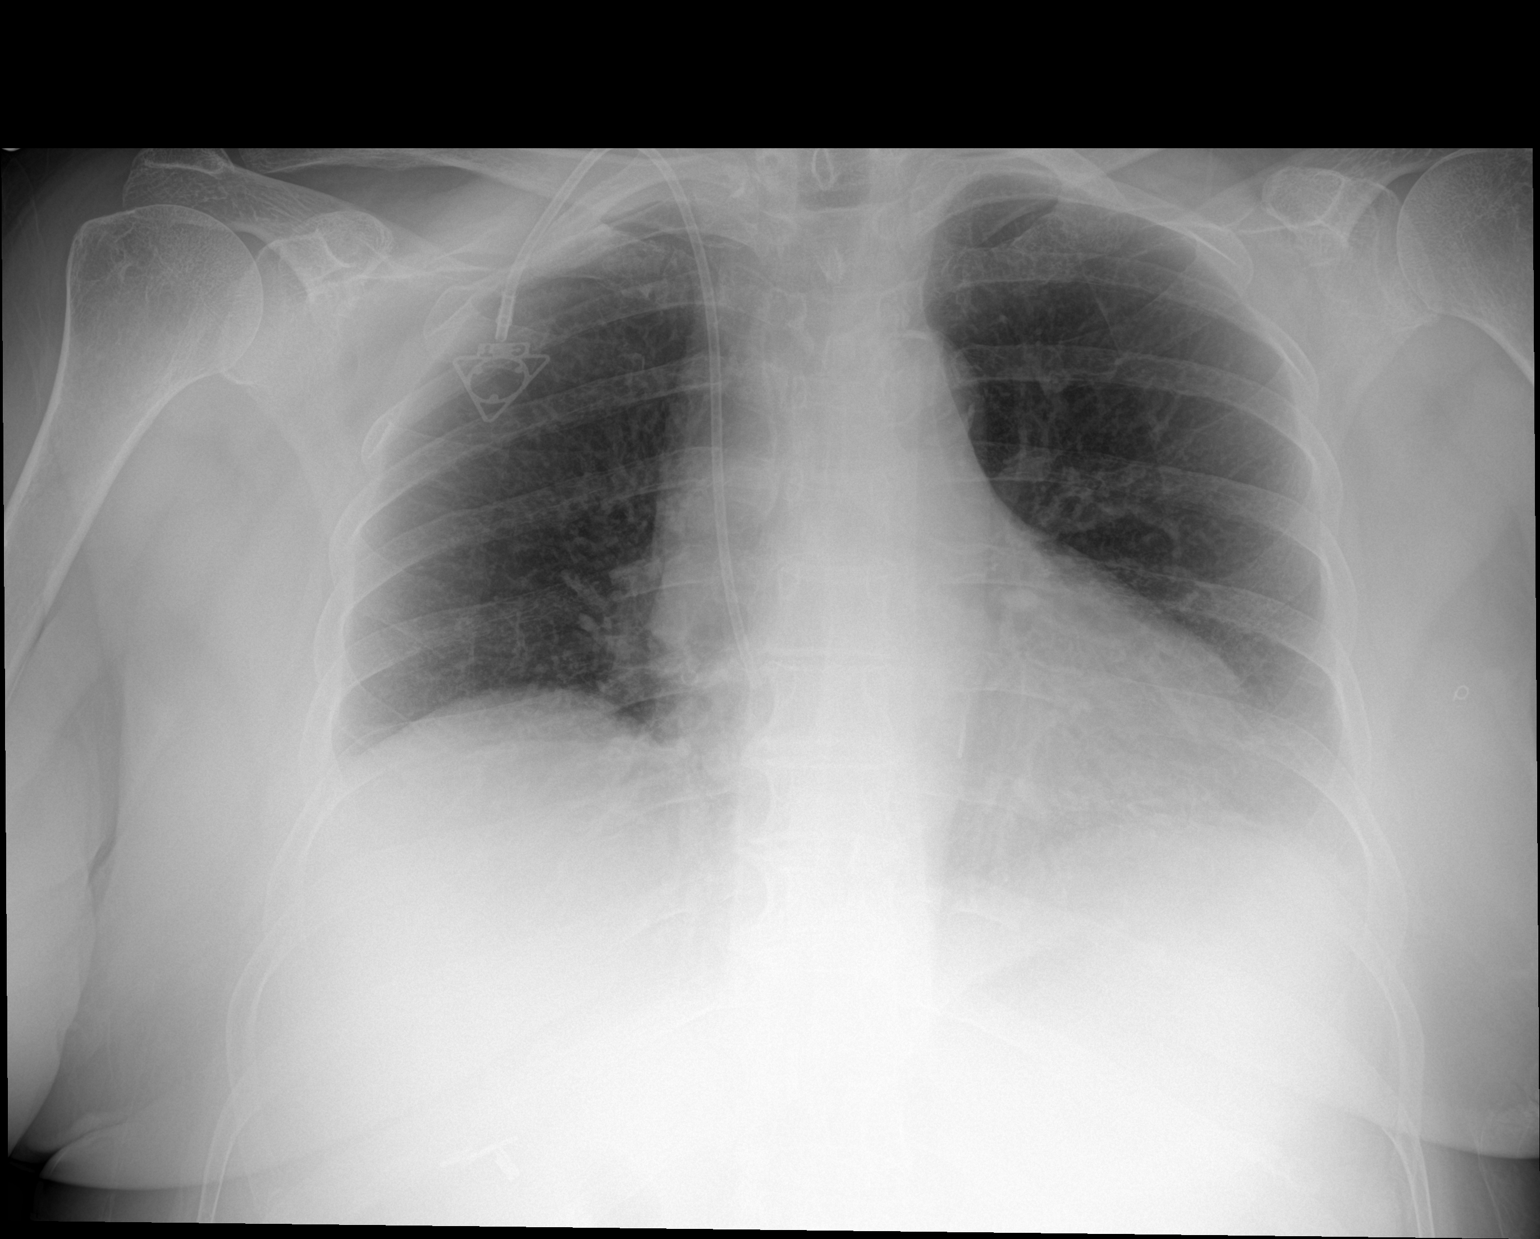

[1 of 1 positions shown; findings below may reference images not displayed]

FINDINGS: Portable AP semi upright view at [NP] hours. Right chest power port
in place. Lower lung volumes. Mediastinal contours remain normal.
Visualized tracheal air column is within normal limits. Allowing for
portable technique the lungs are clear. Paucity of bowel gas in the
upper abdomen. Right upper quadrant cholecystectomy clips. No acute
osseous abnormality identified.
IMPRESSION: No acute cardiopulmonary abnormality.

## 2020-03-30 SURGERY — EGD (ESOPHAGOGASTRODUODENOSCOPY)
Anesthesia: Monitor Anesthesia Care

## 2020-03-30 MED ORDER — INSULIN ASPART 100 UNIT/ML ~~LOC~~ SOLN
0.0000 [IU] | SUBCUTANEOUS | Status: DC
  Administered 2020-03-30 (×3): 3 [IU] via SUBCUTANEOUS
  Administered 2020-03-31 (×3): 1 [IU] via SUBCUTANEOUS
  Filled 2020-03-30: qty 0.09

## 2020-03-30 MED ORDER — ACETAMINOPHEN 650 MG RE SUPP: 650.0000 mg | Freq: Four times a day (QID) | RECTAL | Status: DC | PRN

## 2020-03-30 MED ORDER — SODIUM CHLORIDE (PF) 0.9 % IJ SOLN
INTRAMUSCULAR | Status: AC
  Administered 2020-03-30: 10 mL
  Filled 2020-03-30: qty 50

## 2020-03-30 MED ORDER — ONDANSETRON HCL 4 MG/2ML IJ SOLN: 4.0000 mg | Freq: Four times a day (QID) | INTRAMUSCULAR | Status: DC | PRN

## 2020-03-30 MED ORDER — PROPOFOL 500 MG/50ML IV EMUL
INTRAVENOUS | Status: DC | PRN
  Administered 2020-03-30: 125 ug/kg/min via INTRAVENOUS

## 2020-03-30 MED ORDER — FENTANYL CITRATE (PF) 100 MCG/2ML IJ SOLN
50.0000 ug | Freq: Once | INTRAMUSCULAR | Status: AC
  Administered 2020-03-30: 50 ug via INTRAVENOUS
  Filled 2020-03-30: qty 2

## 2020-03-30 MED ORDER — PANTOPRAZOLE SODIUM 40 MG IV SOLR: 40.0000 mg | Freq: Two times a day (BID) | INTRAVENOUS | Status: DC

## 2020-03-30 MED ORDER — SODIUM CHLORIDE 0.9 % IV SOLN
8.0000 mg/h | INTRAVENOUS | Status: DC
  Administered 2020-03-30 – 2020-04-01 (×6): 8 mg/h via INTRAVENOUS
  Filled 2020-03-30 (×7): qty 80

## 2020-03-30 MED ORDER — SODIUM CHLORIDE 0.9 % IV SOLN
80.0000 mg | Freq: Once | INTRAVENOUS | Status: AC
  Administered 2020-03-30: 80 mg via INTRAVENOUS
  Filled 2020-03-30: qty 80

## 2020-03-30 MED ORDER — SODIUM CHLORIDE 0.9 % IV SOLN: INTRAVENOUS | Status: DC

## 2020-03-30 MED ORDER — ONDANSETRON HCL 4 MG PO TABS: 4.0000 mg | ORAL_TABLET | Freq: Four times a day (QID) | ORAL | Status: DC | PRN

## 2020-03-30 MED ORDER — DIPHENHYDRAMINE HCL 50 MG/ML IJ SOLN
25.0000 mg | Freq: Once | INTRAMUSCULAR | Status: AC
  Administered 2020-03-30: 25 mg via INTRAVENOUS
  Filled 2020-03-30: qty 1

## 2020-03-30 MED ORDER — PROPOFOL 10 MG/ML IV BOLUS
INTRAVENOUS | Status: DC | PRN
  Administered 2020-03-30 (×2): 20 mg via INTRAVENOUS
  Administered 2020-03-30: 10 mg via INTRAVENOUS

## 2020-03-30 MED ORDER — ALBUTEROL SULFATE (2.5 MG/3ML) 0.083% IN NEBU: 3.0000 mL | INHALATION_SOLUTION | Freq: Four times a day (QID) | RESPIRATORY_TRACT | Status: DC | PRN

## 2020-03-30 MED ORDER — ONDANSETRON HCL 4 MG/2ML IJ SOLN
4.0000 mg | Freq: Once | INTRAMUSCULAR | Status: AC
  Administered 2020-03-30: 4 mg via INTRAVENOUS
  Filled 2020-03-30: qty 2

## 2020-03-30 MED ORDER — ACETAMINOPHEN 325 MG PO TABS
650.0000 mg | ORAL_TABLET | Freq: Four times a day (QID) | ORAL | Status: DC | PRN
  Administered 2020-03-31 – 2020-04-01 (×2): 650 mg via ORAL
  Filled 2020-03-30 (×2): qty 2

## 2020-03-30 MED ORDER — LEVOTHYROXINE SODIUM 75 MCG PO TABS
75.0000 ug | ORAL_TABLET | Freq: Every day | ORAL | Status: DC
  Administered 2020-03-31 – 2020-04-01 (×2): 75 ug via ORAL
  Filled 2020-03-30 (×3): qty 1

## 2020-03-30 MED ORDER — IOHEXOL 350 MG/ML SOLN
100.0000 mL | Freq: Once | INTRAVENOUS | Status: AC | PRN
  Administered 2020-03-30: 75 mL via INTRAVENOUS

## 2020-03-30 MED ORDER — AMLODIPINE BESYLATE 10 MG PO TABS
10.0000 mg | ORAL_TABLET | Freq: Every day | ORAL | Status: DC
  Administered 2020-03-31 – 2020-04-01 (×2): 10 mg via ORAL
  Filled 2020-03-30: qty 2
  Filled 2020-03-30 (×2): qty 1

## 2020-03-30 MED ORDER — VITAMIN D3 25 MCG (1000 UNIT) PO TABS
1000.0000 [IU] | ORAL_TABLET | Freq: Every day | ORAL | Status: DC
  Administered 2020-03-31 – 2020-04-01 (×3): 1000 [IU] via ORAL
  Filled 2020-03-30 (×6): qty 1

## 2020-03-30 MED ORDER — METHYLPREDNISOLONE SODIUM SUCC 125 MG IJ SOLR
125.0000 mg | Freq: Once | INTRAMUSCULAR | Status: AC
  Administered 2020-03-30: 125 mg via INTRAVENOUS
  Filled 2020-03-30: qty 2

## 2020-03-30 MED ORDER — ROPINIROLE HCL 0.25 MG PO TABS
0.2500 mg | ORAL_TABLET | Freq: Once | ORAL | Status: AC
  Administered 2020-03-30: 0.25 mg via ORAL
  Filled 2020-03-30: qty 1

## 2020-03-30 MED ORDER — PROPOFOL 500 MG/50ML IV EMUL
INTRAVENOUS | Status: AC
  Filled 2020-03-30: qty 50

## 2020-03-30 NOTE — Progress Notes (Signed)
Endoscopy was well-tolerated.  Please see dictated report.  No active bleeding, moderately severe erosive esophagitis, involving most of the esophagus.  I think we can allow a diet, but for now I would continue her continuous Protonix infusion as the best way to control stomach acid.    Tomorrow, if she has tolerated a diet and is free of further hematemesis, I think discharge home would be appropriate on twice daily PPI therapy.  I will contact the patient with her biopsy results, when they are available.  I will also arrange follow-up with me for approximately 6 weeks from now; at that time, I anticipate we will reduce her pantoprazole to once daily dosing and follow-up with an endoscopy about a month thereafter.  I will tentatively sign off, but please feel free to call me if further input from me is needed.  Cleotis Nipper, M.D. Pager [phone] If no answer or after 5 PM call [phone]

## 2020-03-30 NOTE — ED Notes (Signed)
XR at bedside

## 2020-03-30 NOTE — Anesthesia Procedure Notes (Signed)
Procedure Name: MAC Date/Time: 03/30/2020 1:58 PM Performed by: Niel Hummer, CRNA Pre-anesthesia Checklist: Patient identified, Emergency Drugs available, Suction available and Patient being monitored Oxygen Delivery Method: Simple face mask

## 2020-03-30 NOTE — ED Notes (Signed)
Pt ambulatory to the bathroom, standby assistance. No needs expressed, daughter at bedside.  Will continue to monitor.

## 2020-03-30 NOTE — ED Notes (Signed)
Patient transported to CT 

## 2020-03-30 NOTE — Op Note (Signed)
[facility] Patient Name: Elizabeth Mcintyre Procedure Date: 03/30/2020 MRN: [id number] Attending MD: Ronald Lobo , MD Date of Birth: [birth year] CSN: [id number] Age: 79 Admit Type: Inpatient Procedure:                Upper GI endoscopy Indications:              Hematemesis in a patient just started on                            chemotherapy for lymphoma yesterday, with h/o (5                            yrs ago) of ulcerative esophagitis on egd done in                            [place], confirmed to have healed by repeat egd,                            but now off PPI therapy for years. Providers:                Ronald Lobo, MD, Elmer Ramp. Tilden Dome, RN, Tyna Jaksch Technician, William Dalton, Technician Referring MD:              Medicines:                Monitored Anesthesia Care Complications:            No immediate complications. Estimated Blood Loss:     Estimated blood loss: 2 mL. Procedure:                Pre-Anesthesia Assessment:                           - Prior to the procedure, a History and Physical                            was performed, and patient medications and                            allergies were reviewed. The patient's tolerance of                            previous anesthesia was also reviewed. The risks                            and benefits of the procedure and the sedation                            options and risks were discussed with the patient.                            All questions were answered, and informed consent  was obtained. Prior Anticoagulants: The patient has                            taken no previous anticoagulant or antiplatelet                            agents. ASA Grade Assessment: III - A patient with                            severe systemic disease. After reviewing the risks                            and benefits, the patient was deemed in                             satisfactory condition to undergo the procedure.                           After obtaining informed consent, the endoscope was                            passed under direct vision. Throughout the                            procedure, the patient's blood pressure, pulse, and                            oxygen saturations were monitored continuously. The                            GIF-H190 ([id number]) Olympus gastroscope was                            introduced through the mouth, and advanced to the                            second part of duodenum. The upper GI endoscopy was                            accomplished without difficulty. The patient                            tolerated the procedure well. Scope In: Scope Out: Findings:      The larynx was normal.      Moderately severe erosive, circuferential, confluent esophagitis with no       bleeding was found 25 to 40 cm from the incisors. No deep or discrete       ulcerations to suggest viral infection. Biopsies were taken with a cold       forceps for histology. Estimated blood loss: 2 mL.      A non-obstructing and mild Schatzki ring was found at the       gastroesophageal junction. There were also multiple distal esophageal       rings noted during insufflation of the distal esophagus. Interestingly,  only a minimal intermittent hiatal hernia was present.      The entire examined stomach was normal. It contained no blood or coffee       ground material.      The cardia and gastric fundus were normal on retroflexion.      The examined duodenum was normal. Impression:               - No active bleeding or blood in stomach at time of                            this exam.                           - Normal larynx.                           - Moderately severe reflux, chronic and erosive                            esophagitis with no bleeding. Biopsied.                           - Non-obstructing and mild Schatzki  ring.                           - Normal stomach.                           - Normal examined duodenum. Moderate Sedation:      This patient was sedated with monitored anesthesia care, not moderate       sedation. Recommendation:           - Await pathology results.                           - Use Protonix (pantoprazole) 40 mg PO BID.                           - Repeat upper endoscopy in 2 months to check                            healing. Procedure Code(s):        --- Professional ---                           [phone], Esophagogastroduodenoscopy, flexible,                            transoral; with biopsy, single or multiple Diagnosis Code(s):        --- Professional ---                           K21.00, Gastro-esophageal reflux disease with                            esophagitis, without bleeding  K20.80, Other esophagitis without bleeding                           K22.2, Esophageal obstruction                           K92.0, Hematemesis CPT copyright 2019 [employer]. All rights reserved. The codes documented in this report are preliminary and upon coder review may  be revised to meet current compliance requirements. Ronald Lobo, MD 03/30/2020 2:23:44 PM This report has been signed electronically. Number of Addenda: 0

## 2020-03-30 NOTE — ED Notes (Addendum)
Pt provided warm blanket and requesting something for pain and restless legs.  Daughter remains at bedside.  No emesis noted during this writer's shift.

## 2020-03-30 NOTE — Consult Note (Signed)
Referring Provider: Dr. Marylu Lund Primary Care Physician:  Ma Hillock, DO Primary Gastroenterologist:  Althia Forts  Reason for Consultation: Hematemesis  HPI: Elizabeth Mcintyre is a 79 y.o. 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  Patient was in her usual state of health until yesterday when she completed her first dose of chemotherapy.  She later experienced chest pain and "spit up" blood multiple times, which she felt came from her esophagus.  She had some belching and when she belched, it further exacerbated symptoms, and she spit up more blood. She denies any dysphagia, changes in appetite, or unexplained weight loss.    She has also noted some abdominal bloating and discomfort. Denies any changes in bowel movements.  Several months ago, she had some diarrhea, but this resolved after she stopped drinking Pepsi.  She denies any melena or hematochezia.  She has a history of an esophageal ulcer and was previously on PPI therapy but has not been on any PPI for several years.  Denies any NSAID, aspirin, or blood thinner use.  Records reviewed:  EGD 03/30/2015: Schatzki's ring-status post [facility] dilation. Remainder of EGD normal. Previously noted esophageal ulcer completely healed.  EGD 01/2015: Distal esophageal ulcer consistent with reflux esophagitis. Schatzki's ring present-not manipulated because of recent bleeding  Colonoscopy 01/2011: mild diverticulosis otherwise normal, repeat in 10 years  Past Medical History:  Diagnosis Date  . Asthma   . Bloating 04/26/2019  . Cancer ([place]) 2021   Lymphoma  . 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 DIABETES MELLITUS, TYPE II 11/09/2007   diet control  . Diverticulosis 03/2011  . GERD (gastroesophageal reflux disease)     none recently  . GI bleed   . History of rheumatoid arthritis    during 30's, was treated.  . Hyperlipidemia   . Hypertension   . IBS (irritable bowel syndrome)   . Osteopenia 2017   Last  bone density 05/04/2017: -2.4  . PONV (postoperative nausea and vomiting)   . Stress incontinence   . Stroke [facility])    mini stroke - found on a CT scan    Past Surgical History:  Procedure Laterality Date  . ABDOMINAL HYSTERECTOMY    . CARDIAC CATHETERIZATION     X 2, last one in 1998  . CHOLECYSTECTOMY N/A 04/13/2017   Procedure: LAPAROSCOPIC CHOLECYSTECTOMY;  Surgeon: Aviva Signs, MD;  Location: [facility];  Service: General;  Laterality: N/A;  . COLONOSCOPY    . COLONOSCOPY  May 2012   Dr. Olevia Perches: mild diverticulosis, otherwise normal.   . ESOPHAGOGASTRODUODENOSCOPY N/A 01/28/2015   Dr. Gala Romney: reflux esophagitis, Schatzki's ring not manipulated due to recent bleeding  . ESOPHAGOGASTRODUODENOSCOPY N/A 03/30/2015   Dr. Gala Romney: Schatzki's ring s/p Venia Minks dilation, previously noted esophageal ulcer completely healed  . IR IMAGING GUIDED PORT INSERTION  03/13/2020  . LYMPH NODE BIOPSY Left 03/20/2020   Procedure: LEFT POSTERIOR CERVICAL LYMPH NODE BIOPSY;  Surgeon: Georganna Skeans, MD;  Location: [place];  Service: General;  Laterality: Left;  Marland Kitchen MALONEY DILATION N/A 03/30/2015   Procedure: Venia Minks DILATION;  Surgeon: Daneil Dolin, MD;  Location: [facility];  Service: Endoscopy;  Laterality: N/A;    Prior to Admission medications   Medication Sig Start Date End Date Taking? Authorizing Provider  acetaminophen (TYLENOL) 325  MG tablet Take 650 mg by mouth every 6 (six) hours as needed for moderate pain.    Yes [provider]  allopurinol (ZYLOPRIM) 300 MG tablet Take 1 tablet (300 mg total) by mouth daily. 03/07/20  Yes Orson Slick, MD  Cholecalciferol (VITAMIN D3) 25 MCG (1000 UT) CAPS Take 1,000 Units by mouth daily.    Yes [provider]  dicyclomine (BENTYL) 10 MG capsule  Take 1 capsule (10 mg total) by mouth 4 (four) times daily -  before meals and at bedtime. Needs appt for further refills. Patient taking differently: Take 10 mg by mouth 3 (three) times daily as needed for spasms.  11/25/19  Yes Kuneff, Renee A, DO  levothyroxine (SYNTHROID) 75 MCG tablet Take 1 tablet (75 mcg total) by mouth daily. 06/09/19  Yes Kuneff, Renee A, DO  lidocaine-prilocaine (EMLA) cream Apply 1 application topically as needed. 03/07/20  Yes Orson Slick, MD  ondansetron (ZOFRAN) 8 MG tablet Take 1 tablet (8 mg total) by mouth every 8 (eight) hours as needed for nausea or vomiting. 03/07/20  Yes Orson Slick, MD  vitamin B-12 (CYANOCOBALAMIN) 1000 MCG tablet Take 1,000 mcg by mouth daily.   Yes [provider]  albuterol (VENTOLIN HFA) 108 (90 Base) MCG/ACT inhaler Inhale 2 puffs into the lungs every 6 (six) hours as needed for wheezing. 01/03/20 01/02/21  Raoul Pitch, Renee A, DO  amLODipine (NORVASC) 10 MG tablet Take 1 tablet (10 mg total) by mouth daily. Patient not taking: Reported on 03/30/2020 12/29/19   Howard Pouch A, DO  prochlorperazine (COMPAZINE) 10 MG tablet Take 1 tablet (10 mg total) by mouth every 6 (six) hours as needed for nausea or vomiting. 03/07/20   Orson Slick, MD    Current Facility-Administered Medications  Medication Dose Route Frequency Provider Last Rate Last Admin  . 0.9 %  sodium chloride infusion   Intravenous Continuous Ward, Delice Bison, DO 125 mL/hr at 03/30/20 0422 New Bag at 03/30/20 0422  . acetaminophen (TYLENOL) tablet 650 mg  650 mg Oral Q6H PRN Donne Hazel, MD       Or  . acetaminophen (TYLENOL) suppository 650 mg  650 mg Rectal Q6H PRN Donne Hazel, MD      . albuterol (VENTOLIN HFA) 108 (90 Base) MCG/ACT inhaler 2 puff  2 puff Inhalation Q6H PRN Donne Hazel, MD      . amLODipine (NORVASC) tablet 10 mg  10 mg Oral Daily Donne Hazel, MD      . insulin aspart (novoLOG) injection 0-9 Units  0-9 Units Subcutaneous Q4H  Donne Hazel, MD   3 Units at 03/30/20 [phone]  . levothyroxine (SYNTHROID) tablet 75 mcg  75 mcg Oral Daily Donne Hazel, MD      . ondansetron [facility]) tablet 4 mg  4 mg Oral Q6H PRN Donne Hazel, MD       Or  . ondansetron [facility]) injection 4 mg  4 mg Intravenous Q6H PRN Donne Hazel, MD      . pantoprazole (PROTONIX) 80 mg in sodium chloride 0.9 % 100 mL (0.8 mg/mL) infusion  8 mg/hr Intravenous Continuous Ward, Kristen N, DO 10 mL/hr at 03/30/20 0653 8 mg/hr at 03/30/20 0653  . [START ON 04/02/2020] pantoprazole (PROTONIX) injection 40 mg  40 mg Intravenous Q12H Ward, Delice Bison, DO      . Vitamin D3 CAPS 1,000 Units  1,000 Units Oral Daily Donne Hazel,  MD       Current Outpatient Medications  Medication Sig Dispense Refill  . acetaminophen (TYLENOL) 325 MG tablet Take 650 mg by mouth every 6 (six) hours as needed for moderate pain.     Marland Kitchen allopurinol (ZYLOPRIM) 300 MG tablet Take 1 tablet (300 mg total) by mouth daily. 30 tablet 3  . Cholecalciferol (VITAMIN D3) 25 MCG (1000 UT) CAPS Take 1,000 Units by mouth daily.     Marland Kitchen dicyclomine (BENTYL) 10 MG capsule Take 1 capsule (10 mg total) by mouth 4 (four) times daily -  before meals and at bedtime. Needs appt for further refills. (Patient taking differently: Take 10 mg by mouth 3 (three) times daily as needed for spasms. ) 360 capsule 0  . levothyroxine (SYNTHROID) 75 MCG tablet Take 1 tablet (75 mcg total) by mouth daily. 90 tablet 3  . lidocaine-prilocaine (EMLA) cream Apply 1 application topically as needed. 30 g 0  . ondansetron (ZOFRAN) 8 MG tablet Take 1 tablet (8 mg total) by mouth every 8 (eight) hours as needed for nausea or vomiting. 30 tablet 1  . vitamin B-12 (CYANOCOBALAMIN) 1000 MCG tablet Take 1,000 mcg by mouth daily.    Marland Kitchen albuterol (VENTOLIN HFA) 108 (90 Base) MCG/ACT inhaler Inhale 2 puffs into the lungs every 6 (six) hours as needed for wheezing. 8.5 g 2  . amLODipine (NORVASC) 10 MG tablet Take 1 tablet (10 mg  total) by mouth daily. (Patient not taking: Reported on 03/30/2020) 90 tablet 1  . prochlorperazine (COMPAZINE) 10 MG tablet Take 1 tablet (10 mg total) by mouth every 6 (six) hours as needed for nausea or vomiting. 30 tablet 1    Allergies as of 03/30/2020 - Review Complete 03/30/2020  Allergen Reaction Noted  . Iohexol Swelling 03/30/2020  . Codeine phosphate Nausea And Vomiting and Rash     Family History  Problem Relation Age of Onset  . Heart disease Mother   . Osteoarthritis Mother   . Sudden death Father   . Single kidney Father   . Other Father        h/o severe MVA injuries  . Hyperlipidemia Sister   . Other Daughter        Myalgias  . Fibromyalgia Daughter   . Allergies Daughter   . Heart disease Maternal Grandfather   . Sudden death Paternal Grandmother   . Diabetes Paternal Grandfather   . Heart disease Daughter   . Other Daughter        palpitations  . Pulmonary fibrosis Maternal Aunt   . Cancer Paternal Uncle   . Pulmonary fibrosis Maternal Aunt   . Colon cancer Neg Hx     Social History   Socioeconomic History  . Marital status: Widowed    Spouse name: Not on file  . Number of children: 2  . Years of education: Not on file  . Highest education level: Not on file  Occupational History  . Occupation: retired  Tobacco Use  . Smoking status: Never Smoker  . Smokeless tobacco: Never Used  Vaping Use  . Vaping Use: Never used  Substance and Sexual Activity  . Alcohol use: No  . Drug use: No  . Sexual activity: Never  Other Topics Concern  . Not on file  Social History Narrative   Ms. Proffit is widowed. Her young grandson lives with her, for whom she shares custody with her daughter, the son's aunt.     Social Determinants of Radio broadcast assistant  Strain:   . Difficulty of Paying Living Expenses:   Food Insecurity:   . Worried About Charity fundraiser in the Last Year:   . Arboriculturist in the Last Year:   Transportation Needs:   .  Film/video editor (Medical):   Marland Kitchen Lack of Transportation (Non-Medical):   Physical Activity:   . Days of Exercise per Week:   . Minutes of Exercise per Session:   Stress:   . Feeling of Stress :   Social Connections:   . Frequency of Communication with Friends and Family:   . Frequency of Social Gatherings with Friends and Family:   . Attends Religious Services:   . Active Member of Clubs or Organizations:   . Attends Archivist Meetings:   Marland Kitchen Marital Status:   Intimate Partner Violence:   . Fear of Current or Ex-Partner:   . Emotionally Abused:   Marland Kitchen Physically Abused:   . Sexually Abused:     Review of Systems: Review of Systems  Constitutional: Negative for chills, fever and weight loss.  HENT: Negative for hearing loss and tinnitus.   Eyes: Negative for pain and redness.  Respiratory: Negative for cough and shortness of breath.   Cardiovascular: Positive for chest pain. Negative for palpitations.  Gastrointestinal: Positive for abdominal pain and heartburn. Negative for blood in stool, constipation, diarrhea, melena, nausea and vomiting.  Genitourinary: Negative for flank pain and hematuria.  Musculoskeletal: Negative for falls and joint pain.  Skin: Negative for itching and rash.  Neurological: Negative for seizures and loss of consciousness.  Endo/Heme/Allergies: Negative for polydipsia. Does not bruise/bleed easily.  Psychiatric/Behavioral: Negative for substance abuse. The patient is not nervous/anxious.     Physical Exam: Vital signs in last 24 hours: Temp:  [97.5 F (36.4 C)-98 F (36.7 C)] 97.5 F (36.4 C) (07/16 0432) Pulse Rate:  [57-101] 57 (07/16 0800) Resp:  [15-22] 16 (07/16 0800) BP: (135-169)/(52-105) 139/60 (07/16 0800) SpO2:  [94 %-100 %] 94 % (07/16 0800) Weight:  [63.5 kg] 63.5 kg (07/16 0055)   General: Lethargic, elderly, Well-developed, well-nourished, pleasant and cooperative in NAD Head: Normocephalic and atraumatic. Eyes:  Sclera clear, no icterus.   Conjunctiva pink. Mouth:  No ulcerations or lesions.  Oropharynx pink & moist. Neck:  No masses or thyromegaly. Lungs: Clear throughout to auscultation.   No wheezes, crackles, or rhonchi. No evident respiratory distress. Heart: Regular rate and rhythm; no murmurs, clicks, rubs,  or gallops. Abdomen: Soft, nontender, nontympanitic, and nondistended. No masses, hepatosplenomegaly or ventral hernias noted. Normal bowel sounds, without bruits, guarding, or rebound.   Msk:  Symmetrical without gross deformities. Pulses: Normal pulses noted. Extremities: Without clubbing, cyanosis, or edema. Neurologic: Oriented and coherent;  grossly normal neurologically. Skin: Intact without significant lesions or rashes. Cervical Nodes: No significant cervical adenopathy. Psych: Normal mood and affect.  Intake/Output from previous day: 07/15 0701 - 07/16 0700 In: 100 [IV Piggyback:100] Out: -  Intake/Output this shift: No intake/output data recorded.  Lab Results: Recent Labs    03/28/20 0814 03/30/20 0143  WBC 5.8 13.3*  HGB 10.4* 10.6*  HCT 31.4* 31.1*  PLT 106* 124*   BMET Recent Labs    03/28/20 0814 03/30/20 0143  NA 133* 129*  K 3.7 3.8  CL 101 95*  CO2 20* 21*  GLUCOSE 135* 220*  BUN 22 30*  CREATININE 1.41* 1.48*  CALCIUM 9.3 9.4   LFT Recent Labs    03/30/20 0143  PROT 7.4  ALBUMIN 4.4  AST 25  ALT 12  ALKPHOS 56  BILITOT 0.6   PT/INR Recent Labs    03/30/20 0438  LABPROT 13.4  INR 1.1    Studies/Results: CT Angio Chest PE W and/or Wo Contrast  Result Date: 03/30/2020 CLINICAL DATA:  79 year old female with history of severe chest pain and hemoptysis. Nausea and vomiting. Blood in emesis. Recent history of chemotherapy for lymphoma. EXAM: CT ANGIOGRAPHY CHEST CT ABDOMEN AND PELVIS WITH CONTRAST TECHNIQUE: Multidetector CT imaging of the chest was performed using the standard protocol during bolus administration of intravenous  contrast. Multiplanar CT image reconstructions and MIPs were obtained to evaluate the vascular anatomy. Multidetector CT imaging of the abdomen and pelvis was performed using the standard protocol during bolus administration of intravenous contrast. CONTRAST:  50mL OMNIPAQUE IOHEXOL 350 MG/ML SOLN COMPARISON:  CT the chest, abdomen and pelvis 11/29/2009. FINDINGS: CTA CHEST FINDINGS Cardiovascular: Today's study is limited by patient respiratory motion. With these limitations in mind, there are no central, lobar or segmental sized filling defects within the pulmonary arterial tree to suggest clinically relevant pulmonary embolism. Smaller distal subsegmental sized pulmonary embolism cannot be entirely excluded. Heart size is normal. There is no significant pericardial fluid, thickening or pericardial calcification. There is aortic atherosclerosis, as well as atherosclerosis of the great vessels of the mediastinum and the coronary arteries, including calcified atherosclerotic plaque in the left main and left anterior descending coronary arteries. Right internal jugular single-lumen porta cath with tip terminating in the right atrium. Mediastinum/Nodes: No pathologically enlarged mediastinal or hilar lymph nodes. Esophagus is unremarkable in appearance. Enlarged left axillary lymph nodes measuring up to 1.8 cm in short axis. No right axillary lymphadenopathy. Lungs/Pleura: No large suspicious appearing pulmonary nodules or masses are noted. Smaller pulmonary nodules are poorly evaluated on today's motion limited examination. No confluent consolidative airspace disease. No pleural effusions. Dependent areas of atelectasis and/or scarring noted throughout the lungs bilaterally. Musculoskeletal: There are no aggressive appearing lytic or blastic lesions noted in the visualized portions of the skeleton. Review of the MIP images confirms the above findings. CT ABDOMEN and PELVIS FINDINGS Hepatobiliary: No suspicious cystic  or solid hepatic lesions. No intra or extrahepatic biliary ductal dilatation. Status post cholecystectomy. Pancreas: No pancreatic mass. No pancreatic ductal dilatation. No pancreatic or peripancreatic fluid collections or inflammatory changes. Spleen: Spleen is enlarged measuring 11.0 x 5.6 x 16.4 cm (estimated splenic volume of 505 mL). Adrenals/Urinary Tract: Exophytic 1.5 cm low-attenuation lesion in the lateral aspect of the interpolar region of the right kidney is compatible with a simple cyst. Left kidney and bilateral adrenal glands are normal in appearance. No hydroureteronephrosis. Urinary bladder is normal in appearance. Stomach/Bowel: Normal appearance of the stomach. No pathologic dilatation of small bowel or colon. A few scattered colonic diverticulae are noted, without surrounding inflammatory changes to suggest an acute diverticulitis at this time. Normal appendix. Vascular/Lymphatic: Aortic atherosclerosis, without evidence of aneurysm or dissection in the abdominal or pelvic vasculature. No lymphadenopathy noted in the abdomen or pelvis. Reproductive: Status post hysterectomy. Ovaries are not confidently identified may be surgically absent or atrophic. Other: No significant volume of ascites.  No pneumoperitoneum. Musculoskeletal: There are no aggressive appearing lytic or blastic lesions noted in the visualized portions of the skeleton. Review of the MIP images confirms the above findings. IMPRESSION: 1. Despite the limitations of today's examination, there is no evidence to suggest clinically relevant central, lobar or segmental sized pulmonary embolism. 2. No acute findings are noted in the  abdomen or pelvis to account for the patient's symptoms. 3. Multiple borderline enlarged and mildly enlarged left axillary lymph nodes. No lymphadenopathy noted elsewhere in the chest, abdomen or pelvis. 4. Splenomegaly. Electronically Signed   By: Vinnie Langton M.D.   On: 03/30/2020 05:41   CT ABDOMEN  PELVIS W CONTRAST  Result Date: 03/30/2020 CLINICAL DATA:  79 year old female with history of severe chest pain and hemoptysis. Nausea and vomiting. Blood in emesis. Recent history of chemotherapy for lymphoma. EXAM: CT ANGIOGRAPHY CHEST CT ABDOMEN AND PELVIS WITH CONTRAST TECHNIQUE: Multidetector CT imaging of the chest was performed using the standard protocol during bolus administration of intravenous contrast. Multiplanar CT image reconstructions and MIPs were obtained to evaluate the vascular anatomy. Multidetector CT imaging of the abdomen and pelvis was performed using the standard protocol during bolus administration of intravenous contrast. CONTRAST:  52mL OMNIPAQUE IOHEXOL 350 MG/ML SOLN COMPARISON:  CT the chest, abdomen and pelvis 11/29/2009. FINDINGS: CTA CHEST FINDINGS Cardiovascular: Today's study is limited by patient respiratory motion. With these limitations in mind, there are no central, lobar or segmental sized filling defects within the pulmonary arterial tree to suggest clinically relevant pulmonary embolism. Smaller distal subsegmental sized pulmonary embolism cannot be entirely excluded. Heart size is normal. There is no significant pericardial fluid, thickening or pericardial calcification. There is aortic atherosclerosis, as well as atherosclerosis of the great vessels of the mediastinum and the coronary arteries, including calcified atherosclerotic plaque in the left main and left anterior descending coronary arteries. Right internal jugular single-lumen porta cath with tip terminating in the right atrium. Mediastinum/Nodes: No pathologically enlarged mediastinal or hilar lymph nodes. Esophagus is unremarkable in appearance. Enlarged left axillary lymph nodes measuring up to 1.8 cm in short axis. No right axillary lymphadenopathy. Lungs/Pleura: No large suspicious appearing pulmonary nodules or masses are noted. Smaller pulmonary nodules are poorly evaluated on today's motion limited  examination. No confluent consolidative airspace disease. No pleural effusions. Dependent areas of atelectasis and/or scarring noted throughout the lungs bilaterally. Musculoskeletal: There are no aggressive appearing lytic or blastic lesions noted in the visualized portions of the skeleton. Review of the MIP images confirms the above findings. CT ABDOMEN and PELVIS FINDINGS Hepatobiliary: No suspicious cystic or solid hepatic lesions. No intra or extrahepatic biliary ductal dilatation. Status post cholecystectomy. Pancreas: No pancreatic mass. No pancreatic ductal dilatation. No pancreatic or peripancreatic fluid collections or inflammatory changes. Spleen: Spleen is enlarged measuring 11.0 x 5.6 x 16.4 cm (estimated splenic volume of 505 mL). Adrenals/Urinary Tract: Exophytic 1.5 cm low-attenuation lesion in the lateral aspect of the interpolar region of the right kidney is compatible with a simple cyst. Left kidney and bilateral adrenal glands are normal in appearance. No hydroureteronephrosis. Urinary bladder is normal in appearance. Stomach/Bowel: Normal appearance of the stomach. No pathologic dilatation of small bowel or colon. A few scattered colonic diverticulae are noted, without surrounding inflammatory changes to suggest an acute diverticulitis at this time. Normal appendix. Vascular/Lymphatic: Aortic atherosclerosis, without evidence of aneurysm or dissection in the abdominal or pelvic vasculature. No lymphadenopathy noted in the abdomen or pelvis. Reproductive: Status post hysterectomy. Ovaries are not confidently identified may be surgically absent or atrophic. Other: No significant volume of ascites.  No pneumoperitoneum. Musculoskeletal: There are no aggressive appearing lytic or blastic lesions noted in the visualized portions of the skeleton. Review of the MIP images confirms the above findings. IMPRESSION: 1. Despite the limitations of today's examination, there is no evidence to suggest  clinically relevant central, lobar or  segmental sized pulmonary embolism. 2. No acute findings are noted in the abdomen or pelvis to account for the patient's symptoms. 3. Multiple borderline enlarged and mildly enlarged left axillary lymph nodes. No lymphadenopathy noted elsewhere in the chest, abdomen or pelvis. 4. Splenomegaly. Electronically Signed   By: Vinnie Langton M.D.   On: 03/30/2020 05:41   DG Chest Port 1 View  Result Date: 03/30/2020 CLINICAL DATA:  79 year old female with chest pain, recently began chemotherapy for lymphoma. EXAM: PORTABLE CHEST 1 VIEW COMPARISON:  Chest radiographs 08/05/2018 and earlier. FINDINGS: Portable AP semi upright view at 0331 hours. Right chest power port in place. Lower lung volumes. Mediastinal contours remain normal. Visualized tracheal air column is within normal limits. Allowing for portable technique the lungs are clear. Paucity of bowel gas in the upper abdomen. Right upper quadrant cholecystectomy clips. No acute osseous abnormality identified. IMPRESSION: No acute cardiopulmonary abnormality. Electronically Signed   By: Genevie Ann M.D.   On: 03/30/2020 03:43    Impression: Small volume hematemesis, esophagitis vs. esophageal ulcer -Mild microcytic anemia: Hgb 10.6, decreased from baseline ~12.5 -BUN 30 (increased from baseline 20-22); Cr 1.48, though patient has CKD stage 3  B cell lymphoma s/p chemotherapy 03/29/20  Plan: EGD today with Dr. Cristina Gong.  I thoroughly discussed the procedure with patient and patient's daughter to include nature, alternatives, benefits, and risks (including but not limited to bleeding, infection, perforation, anesthesia/cardiac and pulmonary complications).  Patient verbalized understanding and gave verbal consent to proceed with EGD today.  Continue IV Protonix.  Continue to monitor H&H with transfusion as needed to maintain Hgb >7.  Continue supportive care with anti-emetics as needed.  [facility] will follow.    LOS: 0 days   Salley Slaughter  03/30/2020, 9:50 AM   Pager [phone] If no answer or after 5 PM call [phone]

## 2020-03-30 NOTE — Anesthesia Preprocedure Evaluation (Addendum)
Anesthesia Evaluation  Patient identified by MRN, date of birth, ID band Patient awake    Reviewed: Allergy & Precautions, NPO status , Patient's Chart, lab work & pertinent test results  History of Anesthesia Complications (+) PONV and history of anesthetic complications  Airway Mallampati: III  TM Distance: >3 FB Neck ROM: Full    Dental  (+) Dental Advisory Given, Teeth Intact   Pulmonary asthma ,    Pulmonary exam normal        Cardiovascular hypertension, Pt. on medications Normal cardiovascular exam     Neuro/Psych  Headaches, PSYCHIATRIC DISORDERS Anxiety Depression CVA, No Residual Symptoms    GI/Hepatic Neg liver ROS, GERD  Controlled, Hx esophageal ulcer    Endo/Other  diabetes, Type 2Hypothyroidism  Hyponatremia, Na 129   Renal/GU Renal InsufficiencyRenal disease  Female GU complaint     Musculoskeletal  (+) Arthritis , Rheumatoid disorders,    Abdominal   Peds  Hematology  (+) anemia ,  Thrombocytopenia    Anesthesia Other Findings Covid test negative   Reproductive/Obstetrics                            Anesthesia Physical Anesthesia Plan  ASA: III  Anesthesia Plan: MAC   Post-op Pain Management:    Induction: Intravenous  PONV Risk Score and Plan: 3 and Propofol infusion and Treatment may vary due to age or medical condition  Airway Management Planned: Nasal Cannula and Natural Airway  Additional Equipment: None  Intra-op Plan:   Post-operative Plan:   Informed Consent: I have reviewed the patients History and Physical, chart, labs and discussed the procedure including the risks, benefits and alternatives for the proposed anesthesia with the patient or authorized representative who has indicated his/her understanding and acceptance.       Plan Discussed with: CRNA and Anesthesiologist  Anesthesia Plan Comments:        Anesthesia Quick  Evaluation

## 2020-03-30 NOTE — ED Provider Notes (Signed)
TIME SEEN: 3:14 AM  CHIEF COMPLAINT: Spitting up blood, chest pain, abdominal pain  HPI: Patient is a 79 year old female with history of lymphoma, hypertension, diabetes, hyperlipidemia, stroke not on antiplatelets or anticoagulants who presents to the emergency department with complaints of belching, burping and "spitting up blood".  Daughter describes it as teaspoonfuls.  This started tonight.  She denies that she is coughing up blood.  She denies that she is vomiting.  She reports having central chest pain that is severe in nature that she is unable to describe.  Reports feeling short of breath.  She denies any cough.  Also complaining of upper abdominal pain.  She denies to me that she is ever had a gastrointestinal bleed.  Denies being on blood thinners.  No bloody stools or melena.  States she is not vomiting.  She states she is followed by Dr. Gordy Levan with gastroenterology in [place].  Her oncologist is Dr. Lorenso Courier.  She had her first chemotherapy yesterday.  Patient underwent left posterior cervical lymph node biopsy on 7/6 extra 1 by Dr. Grandville Silos.  Patient had port placed by IR on 03/13/2020.  ROS: See HPI Constitutional: no fever  Eyes: no drainage  ENT: no runny nose   Cardiovascular:   chest pain  Resp:  SOB  GI: no vomiting GU: no dysuria Integumentary: no rash  Allergy: no hives  Musculoskeletal: no leg swelling  Neurological: no slurred speech ROS otherwise negative  PAST MEDICAL HISTORY/PAST SURGICAL HISTORY:  Past Medical History:  Diagnosis Date  . Asthma   . Bloating 04/26/2019  . Cancer ([place]) 2021   Lymphoma  . 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 DIABETES MELLITUS, TYPE II 11/09/2007   diet control  . Diverticulosis 03/2011  . GERD (gastroesophageal reflux disease)    none recently  . GI bleed   . History of rheumatoid arthritis    during 30's, was treated.  . Hyperlipidemia   . Hypertension   . IBS (irritable bowel  syndrome)   . Osteopenia 2017   Last  bone density 05/04/2017: -2.4  . PONV (postoperative nausea and vomiting)   . Stress incontinence   . Stroke [facility])    mini stroke - found on a CT scan    MEDICATIONS:  Prior to Admission medications   Medication Sig Start Date End Date Taking? Authorizing Provider  acetaminophen (TYLENOL) 325 MG tablet Take 650 mg by mouth every 6 (six) hours as needed for moderate pain.     [provider]  albuterol (VENTOLIN HFA) 108 (90 Base) MCG/ACT inhaler Inhale 2 puffs into the lungs every 6 (six) hours as needed for wheezing. 01/03/20 01/02/21  Howard Pouch A, DO  allopurinol (ZYLOPRIM) 300 MG tablet Take 1 tablet (300 mg total) by mouth daily. Patient not taking: Reported on 03/08/2020 03/07/20   Orson Slick, MD  amLODipine (NORVASC) 10 MG tablet Take 1 tablet (10 mg total) by mouth daily. 12/29/19   Kuneff, Renee A, DO  Cholecalciferol (VITAMIN D3) 25 MCG (1000 UT) CAPS Take 1,000 Units by mouth daily.     [provider]  dicyclomine (BENTYL) 10 MG capsule Take 1 capsule (10 mg total) by mouth 4 (four) times daily -  before meals and at bedtime. Needs appt for further refills. 11/25/19   Kuneff, Renee A, DO  levothyroxine (SYNTHROID) 75 MCG tablet Take 1 tablet (75 mcg total) by mouth daily. 06/09/19   Kuneff,  Renee A, DO  lidocaine-prilocaine (EMLA) cream Apply 1 application topically as needed. 03/07/20   Orson Slick, MD  ondansetron (ZOFRAN) 8 MG tablet Take 1 tablet (8 mg total) by mouth every 8 (eight) hours as needed for nausea or vomiting. 03/07/20   Orson Slick, MD  prochlorperazine (COMPAZINE) 10 MG tablet Take 1 tablet (10 mg total) by mouth every 6 (six) hours as needed for nausea or vomiting. 03/07/20   Orson Slick, MD  vitamin B-12 (CYANOCOBALAMIN) 1000 MCG tablet Take 1,000 mcg by mouth daily.    [provider]    ALLERGIES:  Allergies  Allergen Reactions  . Codeine Phosphate Nausea And Vomiting and  Rash    SOCIAL HISTORY:  Social History   Tobacco Use  . Smoking status: Never Smoker  . Smokeless tobacco: Never Used  Substance Use Topics  . Alcohol use: No    FAMILY HISTORY: Family History  Problem Relation Age of Onset  . Heart disease Mother   . Osteoarthritis Mother   . Sudden death Father   . Single kidney Father   . Other Father        h/o severe MVA injuries  . Hyperlipidemia Sister   . Other Daughter        Myalgias  . Fibromyalgia Daughter   . Allergies Daughter   . Heart disease Maternal Grandfather   . Sudden death Paternal Grandmother   . Diabetes Paternal Grandfather   . Heart disease Daughter   . Other Daughter        palpitations  . Pulmonary fibrosis Maternal Aunt   . Cancer Paternal Uncle   . Pulmonary fibrosis Maternal Aunt   . Colon cancer Neg Hx     EXAM: BP (!) 169/105 (BP Location: Left Arm)   Pulse (!) 101   Temp 98 F (36.7 C) (Oral)   Resp 16   Ht 5\' 2"  (1.575 m)   Wt 63.5 kg   SpO2 100%   BMI 25.61 kg/m  CONSTITUTIONAL: Alert and oriented and responds appropriately to questions.  Elderly, appears uncomfortable but nontoxic HEAD: Normocephalic EYES: Conjunctivae clear, pupils appear equal, EOM appear intact ENT: normal nose; moist mucous membranes, no bleeding from the mouth or gums, normal phonation NECK: Supple, normal ROM CARD: Regular and minimally tachycardic; S1 and S2 appreciated; no murmurs, no clicks, no rubs, no gallops CHEST: Tender to palpation over the port in the right chest wall with no significant erythema, fluctuance, induration, warmth RESP: Normal chest excursion without splinting or tachypnea; breath sounds clear and equal bilaterally; no wheezes, no rhonchi, no rales, no hypoxia or respiratory distress, speaking full sentences ABD/GI: Normal bowel sounds; non-distended; soft, tender throughout the upper abdomen, no rebound, no guarding, no peritoneal signs, no hepatosplenomegaly BACK:  The back appears  normal EXT: Normal ROM in all joints; no deformity noted, no edema; no cyanosis SKIN: Normal color for age and race; warm; no rash on exposed skin NEURO: Moves all extremities equally PSYCH: The patient's mood and manner are appropriate.   MEDICAL DECISION MAKING: Patient here with belching and "spitting up blood".  It is unclear if this is pulmonary or GI in nature.  She is not on antiplatelets or anticoagulants.  Daughter reports it is teaspoonfuls.  She has an emesis bag with tissue and paper towels will some bright red blood.  No coffee-ground emesis.  No clots.  She states symptoms to started tonight and she received her first chemo yesterday.  Complaining of chest pain, shortness of breath and abdominal pain.  Will obtain CTA of her chest to evaluate for esophageal rupture, PE, pulmonary hemorrhage.  Also obtain CT of the abdomen pelvis given upper abdominal pain.  Will give IV fluids, pain and nausea medicine, Protonix in case this is an upper GI bleed.  Patient will need admission.  She is complaining of chest pain with this seems atypical for ACS but will obtain troponin.  EKG shows no ischemic change.  ED PROGRESS: Patient reports feeling like her throat is closing after CT scan.  She did receive IV contrast and has had IV contrast before without difficulty.  This is now been listed in her allergy list by the radiology tech.  Given Solu-Medrol and Benadryl.  On my evaluation, she remains hemodynamically stable with normal oxygen saturation.  She states even prior to Benadryl and Solu-Medrol being given that she is feeling better.  No rash, hives, angioedema.  Continues to have normal phonation.  We will continue to closely monitor.  Patient CT scans showed no acute abnormality.  No PE.  No sign of pulmonary hemorrhage.  Her high-sensitivity troponin is normal at 4.  Hemoglobin 10.6.  Elevated creatinine which is her baseline.  Normal LFTs and lipase.  Urine appears normal.  I am concerned that  her episodes of burping and "spitting up blood" are GI in nature.  This has seemed to improve but have recommended admission for GI consultation not emergently.  Patient comfortable with plan.  6:37 AM Discussed patient's case with hospitalist, Dr. Hal Hope.  I have recommended admission and patient (and family if present) agree with this plan. Admitting physician will place admission orders.   I reviewed all nursing notes, vitals, pertinent previous records and reviewed/interpreted all EKGs, lab and urine results, imaging (as available).    EKG Interpretation  Date/Time:  Friday March 30 2020 03:55:01 EDT Ventricular Rate:  71 PR Interval:    QRS Duration: 105 QT Interval:  444 QTC Calculation: 483 R Axis:   34 Text Interpretation: Sinus rhythm Low voltage, precordial leads No significant change since last tracing Confirmed by Anastyn Ayars, Cyril Mourning [phone]) on 03/30/2020 4:06:16 AM        CRITICAL CARE Performed by: Cyril Mourning Chianne Byrns   Total critical care time: 55 minutes  Critical care time was exclusive of separately billable procedures and treating other patients.  Critical care was necessary to treat or prevent imminent or life-threatening deterioration.  Critical care was time spent personally by me on the following activities: development of treatment plan with patient and/or surrogate as well as nursing, discussions with consultants, evaluation of patient's response to treatment, examination of patient, obtaining history from patient or surrogate, ordering and performing treatments and interventions, ordering and review of laboratory studies, ordering and review of radiographic studies, pulse oximetry and re-evaluation of patient's condition.   KHAYA THEISSEN was evaluated in Emergency Department on 03/30/2020 for the symptoms described in the history of present illness. She was evaluated in the context of the global COVID-19 pandemic, which necessitated consideration that the patient  might be at risk for infection with the SARS-CoV-2 virus that causes COVID-19. Institutional protocols and algorithms that pertain to the evaluation of patients at risk for COVID-19 are in a state of rapid change based on information released by regulatory bodies including the CDC and federal and state organizations. These policies and algorithms were followed during the patient's care in the ED.      Kristal Perl, Delice Bison, DO  03/30/20 0637  

## 2020-03-30 NOTE — ED Triage Notes (Addendum)
Pt sts having first chemo treatment today for tx of lymphoma. Estimates approx 36 episodes of vomiting up blood. Oncology nurse recommended ER for evaluation of bleeding from esophagus or abdomen.

## 2020-03-30 NOTE — ED Notes (Signed)
Pt. Documented in error see above note in chart. 

## 2020-03-30 NOTE — Progress Notes (Signed)
Patient arrives to room 1529 at this time via stretcher from endo

## 2020-03-30 NOTE — Anesthesia Postprocedure Evaluation (Signed)
Anesthesia Post Note  Patient: Elizabeth Mcintyre  Procedure(s) Performed: ESOPHAGOGASTRODUODENOSCOPY (EGD) (N/A ) BIOPSY     Patient location during evaluation: Endoscopy Anesthesia Type: MAC Level of consciousness: awake Pain management: pain level controlled Vital Signs Assessment: post-procedure vital signs reviewed and stable Respiratory status: spontaneous breathing Cardiovascular status: stable Postop Assessment: no apparent nausea or vomiting Anesthetic complications: no   No complications documented.  Last Vitals:  Vitals:   03/30/20 1435 03/30/20 1440  BP:  (!) 163/65  Pulse: 65 63  Resp: 14 15  Temp:    SpO2: 98% 97%    Last Pain:  Vitals:   03/30/20 1440  TempSrc:   PainSc: 0-No pain                 Huston Foley

## 2020-03-30 NOTE — Transfer of Care (Signed)
Immediate Anesthesia Transfer of Care Note  Patient: SADE MEHLHOFF  Procedure(s) Performed: ESOPHAGOGASTRODUODENOSCOPY (EGD) (N/A ) BIOPSY  Patient Location: PACU  Anesthesia Type:MAC  Level of Consciousness: awake  Airway & Oxygen Therapy: Patient Spontanous Breathing and Patient connected to face mask oxygen  Post-op Assessment: Report given to RN, Post -op Vital signs reviewed and stable and Patient moving all extremities X 4  Post vital signs: Reviewed and stable  Last Vitals:  Vitals Value Taken Time  BP    Temp    Pulse 64 03/30/20 1414  Resp 15 03/30/20 1414  SpO2 100 % 03/30/20 1414  Vitals shown include unvalidated device data.  Last Pain:  Vitals:   03/30/20 1316  TempSrc: Oral  PainSc: 0-No pain      Patients Stated Pain Goal: 0 (40/98/11 9147)  Complications: No complications documented.

## 2020-03-30 NOTE — ED Notes (Signed)
Labeled urine specimen and culture sent to lab. ENMiles 

## 2020-03-30 NOTE — H&P (Signed)
History and Physical    Elizabeth Mcintyre KZS:[id number] DOB: [birth year] DOA: 03/30/2020  PCP: Ma Hillock, DO  Patient coming from: Home  Chief Complaint: "Spitting up blood"  HPI: Elizabeth Mcintyre is a 79 y.o. 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 Patient has reported abd "bloating" over the past week. Denies BRBPR, nausea. Does complain of constipation. Describes teaspoons full of blood per episode. Did report generalized abd pain across abdomen and into the epigastric region. Also describes pain radiating to the chest and "burping" before episode.  ED Course: In the ED, pt found to have hgb of 10.6 (was 10.4 on 7/14) with BUN of 30. Cr 1.48. Hospitalist consulted for consideration for admission  Review of Systems:  Review of Systems  Constitutional: Negative for chills and fever.  HENT: Negative for congestion, ear discharge, nosebleeds and tinnitus.   Eyes: Negative for double vision, photophobia and discharge.  Respiratory: Negative for hemoptysis, sputum production and shortness of breath.   Cardiovascular: Positive for chest pain. Negative for orthopnea, claudication and leg swelling.  Gastrointestinal: Positive for abdominal pain and constipation. Negative for blood in stool, diarrhea and nausea.       Hematemesis  Genitourinary: Negative for frequency, hematuria and urgency.  Musculoskeletal: Negative for back pain, falls, joint pain and neck pain.  Neurological: Positive for headaches. Negative for seizures, loss of consciousness and weakness.  Psychiatric/Behavioral: Negative for hallucinations and memory loss. The patient is not nervous/anxious.     Past Medical History:  Diagnosis Date  . Asthma   . Bloating 04/26/2019  . Cancer ([place]) 2021   Lymphoma  . Chronic SI  joint pain    was on tramadol  . 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 DIABETES MELLITUS, TYPE II 11/09/2007   diet control  . Diverticulosis 03/2011  . GERD (gastroesophageal reflux disease)    none recently  . GI bleed   . History of rheumatoid arthritis    during 30's, was treated.  . Hyperlipidemia   . Hypertension   . IBS (irritable bowel syndrome)   . Osteopenia 2017   Last  bone density 05/04/2017: -2.4  . PONV (postoperative nausea and vomiting)   . Stress incontinence   . Stroke [facility])    mini stroke - found on a CT scan    Past Surgical History:  Procedure Laterality Date  . ABDOMINAL HYSTERECTOMY    . CARDIAC CATHETERIZATION     X 2, last one in 1998  . CHOLECYSTECTOMY N/A 04/13/2017   Procedure: LAPAROSCOPIC CHOLECYSTECTOMY;  Surgeon: Aviva Signs, MD;  Location: [facility] ORS;  Service: General;  Laterality: N/A;  . COLONOSCOPY    . COLONOSCOPY  May 2012   Dr. Olevia Perches: mild diverticulosis, otherwise normal.   . ESOPHAGOGASTRODUODENOSCOPY N/A 01/28/2015   Dr. Gala Romney: reflux esophagitis, Schatzki's ring not manipulated due to recent bleeding  . ESOPHAGOGASTRODUODENOSCOPY N/A 03/30/2015   Dr. Gala Romney: Schatzki's ring s/p Venia Minks dilation, previously noted esophageal ulcer completely healed  . IR IMAGING GUIDED PORT INSERTION  03/13/2020  . LYMPH NODE BIOPSY Left 03/20/2020   Procedure: LEFT POSTERIOR CERVICAL LYMPH NODE BIOPSY;  Surgeon: Georganna Skeans, MD;  Location: [place];  Service: General;  Laterality: Left;  Marland Kitchen MALONEY DILATION N/A 03/30/2015   Procedure: Venia Minks DILATION;  Surgeon: Daneil Dolin, MD;  Location: [facility]  ENDO SUITE;  Service: Endoscopy;  Laterality: N/A;     reports that she has never smoked. She has never used smokeless tobacco. She reports that she does not drink alcohol and does not use drugs.  Allergies  Allergen Reactions  . Iohexol Swelling    After injection of contrast, felt like her throat was closing, difficulty swallowing, and hiccups- 0512 03/30/20 [facility]   . Codeine Phosphate Nausea And Vomiting and Rash    Family History  Problem Relation Age of Onset  . Heart disease Mother   . Osteoarthritis Mother   . Sudden death Father   . Single kidney Father   . Other Father        h/o severe MVA injuries  . Hyperlipidemia Sister   . Other Daughter        Myalgias  . Fibromyalgia Daughter   . Allergies Daughter   . Heart disease Maternal Grandfather   . Sudden death Paternal Grandmother   . Diabetes Paternal Grandfather   . Heart disease Daughter   . Other Daughter        palpitations  . Pulmonary fibrosis Maternal Aunt   . Cancer Paternal Uncle   . Pulmonary fibrosis Maternal Aunt   . Colon cancer Neg Hx     Prior to Admission medications   Medication Sig Start Date End Date Taking? Authorizing Provider  acetaminophen (TYLENOL) 325 MG tablet Take 650 mg by mouth every 6 (six) hours as needed for moderate pain.    Yes [provider]  allopurinol (ZYLOPRIM) 300 MG tablet Take 1 tablet (300 mg total) by mouth daily. 03/07/20  Yes Orson Slick, MD  Cholecalciferol (VITAMIN D3) 25 MCG (1000 UT) CAPS Take 1,000 Units by mouth daily.    Yes [provider]  dicyclomine (BENTYL) 10 MG capsule Take 1 capsule (10 mg total) by mouth 4 (four) times daily -  before meals and at bedtime. Needs appt for further refills. Patient taking differently: Take 10 mg by mouth 3 (three) times daily as needed for spasms.  11/25/19  Yes Kuneff, Renee A, DO  levothyroxine (SYNTHROID) 75 MCG tablet Take 1 tablet (75 mcg total) by mouth daily. 06/09/19  Yes Kuneff, Renee A, DO  lidocaine-prilocaine (EMLA) cream Apply 1 application topically as needed. 03/07/20  Yes Orson Slick, MD  ondansetron (ZOFRAN) 8 MG tablet Take 1 tablet (8 mg total) by mouth every 8 (eight) hours as needed for nausea or vomiting. 03/07/20  Yes Orson Slick, MD  vitamin B-12 (CYANOCOBALAMIN) 1000 MCG tablet Take 1,000 mcg by mouth daily.   Yes [provider]  albuterol (VENTOLIN HFA) 108 (90 Base) MCG/ACT inhaler Inhale 2 puffs into the lungs every 6 (six) hours as needed for wheezing. 01/03/20 01/02/21  Raoul Pitch, Renee A, DO  amLODipine (NORVASC) 10 MG tablet Take 1 tablet (10 mg total) by mouth daily. Patient not taking: Reported on 03/30/2020 12/29/19   Howard Pouch A, DO  prochlorperazine (COMPAZINE) 10 MG tablet Take 1 tablet (10 mg total) by mouth every 6 (six) hours as needed for nausea or vomiting. 03/07/20   Orson Slick, MD    Physical Exam: Vitals:   03/30/20 0504 03/30/20 0529 03/30/20 0602 03/30/20 0629  BP: (!) 167/72 (!) 135/103 (!) 162/73 (!) 141/52  Pulse: 68 79 64 70  Resp:    19  Temp:      TempSrc:      SpO2: 97% 98% 96% 95%  Weight:      Height:        Constitutional: NAD, calm, comfortable Vitals:   03/30/20 0504 03/30/20 0529 03/30/20 0602 03/30/20 0629  BP: (!) 167/72 (!) 135/103 (!) 162/73 (!) 141/52  Pulse: 68 79 64 70  Resp:    19  Temp:      TempSrc:      SpO2: 97% 98% 96% 95%  Weight:      Height:       Eyes: PERRL, lids and conjunctivae normal ENMT: Mucous membranes are moist. Posterior pharynx clear of any exudate or lesions.Normal dentition.  Neck: normal, supple, no masses, no thyromegaly Respiratory: clear to auscultation bilaterally, normal resp effort Cardiovascular: Regular rate and rhythm, No extremity edema. 2+ pedal pulses. No carotid bruits.  Abdomen: no tenderness, no masses palpated. . Bowel sounds positive.  Musculoskeletal: no clubbing / cyanosis. No joint deformity upper and lower extremities. Good ROM, no contractures. Normal muscle tone.  Skin: no rashes, lesions, ulcers. No induration Neurologic: CN 2-12 grossly intact. Sensation intact, Strength 5/5 in all 4.  Psychiatric: Normal judgment and insight. Alert and oriented x 3. Normal mood.    Labs on Admission: I have personally reviewed following labs and imaging studies  CBC: Recent Labs  Lab 03/28/20 0814  03/30/20 0143  WBC 5.8 13.3*  NEUTROABS 3.4  --   HGB 10.4* 10.6*  HCT 31.4* 31.1*  MCV 80.3 79.9*  PLT 106* 161*   Basic Metabolic Panel: Recent Labs  Lab 03/28/20 0814 03/30/20 0143  NA 133* 129*  K 3.7 3.8  CL 101 95*  CO2 20* 21*  GLUCOSE 135* 220*  BUN 22 30*  CREATININE 1.41* 1.48*  CALCIUM 9.3 9.4   GFR: Estimated Creatinine Clearance: 27.9 mL/min (A) (by C-G formula based on SCr of 1.48 mg/dL (H)). Liver Function Tests: Recent Labs  Lab 03/28/20 0814 03/30/20 0143  AST 16 25  ALT 10 12  ALKPHOS 65 56  BILITOT 0.5 0.6  PROT 6.9 7.4  ALBUMIN 3.8 4.4   Recent Labs  Lab 03/30/20 0143  LIPASE 25   No results for input(s): AMMONIA in the last 168 hours. Coagulation Profile: Recent Labs  Lab 03/30/20 0438  INR 1.1   Cardiac Enzymes: No results for input(s): CKTOTAL, CKMB, CKMBINDEX, TROPONINI in the last 168 hours. BNP (last 3 results) No results for input(s): PROBNP in the last 8760 hours. HbA1C: No results for input(s): HGBA1C in the last 72 hours. CBG: No results for input(s): GLUCAP in the last 168 hours. Lipid Profile: No results for input(s): CHOL, HDL, LDLCALC, TRIG, CHOLHDL, LDLDIRECT in the last 72 hours. Thyroid Function Tests: No results for input(s): TSH, T4TOTAL, FREET4, T3FREE, THYROIDAB in the last 72 hours. Anemia Panel: No results for input(s): VITAMINB12, FOLATE, FERRITIN, TIBC, IRON, RETICCTPCT in the last 72 hours. Urine analysis:    Component Value Date/Time   COLORURINE YELLOW 03/30/2020 0143   APPEARANCEUR CLEAR 03/30/2020 0143   LABSPEC 1.013 03/30/2020 0143   PHURINE 6.0 03/30/2020 0143   GLUCOSEU NEGATIVE 03/30/2020 0143   GLUCOSEU NEGATIVE 10/15/2015 1309   HGBUR NEGATIVE 03/30/2020 0143   HGBUR trace-intact 11/26/2010 1021   BILIRUBINUR NEGATIVE 03/30/2020 0143   BILIRUBINUR neg 06/06/2016 0932   KETONESUR NEGATIVE 03/30/2020 0143   PROTEINUR NEGATIVE 03/30/2020 0143   UROBILINOGEN 0.2 06/06/2016 0932    UROBILINOGEN 0.2 10/15/2015 1309   NITRITE NEGATIVE 03/30/2020 0143   LEUKOCYTESUR NEGATIVE 03/30/2020 0143   Sepsis Labs: !!!!!!!!!!!!!!!!!!!!!!!!!!!!!!!!!!!!!!!!!!!! @LABRCNTIP (procalcitonin:4,lacticidven:4) ) Recent Results (  from the past 240 hour(s))  SARS Coronavirus 2 by RT PCR (hospital order, performed in [facility] hospital lab) Nasopharyngeal Nasopharyngeal Swab     Status: None   Collection Time: 03/30/20  4:38 AM   Specimen: Nasopharyngeal Swab  Result Value Ref Range Status   SARS Coronavirus 2 NEGATIVE NEGATIVE Final    Comment: (NOTE) SARS-CoV-2 target nucleic acids are NOT DETECTED.  The SARS-CoV-2 RNA is generally detectable in upper and lower respiratory specimens during the acute phase of infection. The lowest concentration of SARS-CoV-2 viral copies this assay can detect is 250 copies / mL. A negative result does not preclude SARS-CoV-2 infection and should not be used as the sole basis for treatment or other patient management decisions.  A negative result may occur with improper specimen collection / handling, submission of specimen other than nasopharyngeal swab, presence of viral mutation(s) within the areas targeted by this assay, and inadequate number of viral copies (<250 copies / mL). A negative result must be combined with clinical observations, patient history, and epidemiological information.  Fact Sheet for Patients:   StrictlyIdeas.no  Fact Sheet for Healthcare Providers: BankingDealers.co.za  This test is not yet approved or  cleared by the [place] FDA and has been authorized for detection and/or diagnosis of SARS-CoV-2 by FDA under an Emergency Use Authorization (EUA).  This EUA will remain in effect (meaning this test can be used) for the duration of the COVID-19 declaration under Section 564(b)(1) of the Act, 21 U.S.C. section 360bbb-3(b)(1), unless the authorization is terminated  or revoked sooner.  Performed at [facility], [place] [address], [place], [place] [address]      Radiological Exams on Admission: CT Angio Chest PE W and/or Wo Contrast  Result Date: 03/30/2020 CLINICAL DATA:  79 year old female with history of severe chest pain and hemoptysis. Nausea and vomiting. Blood in emesis. Recent history of chemotherapy for lymphoma. EXAM: CT ANGIOGRAPHY CHEST CT ABDOMEN AND PELVIS WITH CONTRAST TECHNIQUE: Multidetector CT imaging of the chest was performed using the standard protocol during bolus administration of intravenous contrast. Multiplanar CT image reconstructions and MIPs were obtained to evaluate the vascular anatomy. Multidetector CT imaging of the abdomen and pelvis was performed using the standard protocol during bolus administration of intravenous contrast. CONTRAST:  59mL OMNIPAQUE IOHEXOL 350 MG/ML SOLN COMPARISON:  CT the chest, abdomen and pelvis 11/29/2009. FINDINGS: CTA CHEST FINDINGS Cardiovascular: Today's study is limited by patient respiratory motion. With these limitations in mind, there are no central, lobar or segmental sized filling defects within the pulmonary arterial tree to suggest clinically relevant pulmonary embolism. Smaller distal subsegmental sized pulmonary embolism cannot be entirely excluded. Heart size is normal. There is no significant pericardial fluid, thickening or pericardial calcification. There is aortic atherosclerosis, as well as atherosclerosis of the great vessels of the mediastinum and the coronary arteries, including calcified atherosclerotic plaque in the left main and left anterior descending coronary arteries. Right internal jugular single-lumen porta cath with tip terminating in the right atrium. Mediastinum/Nodes: No pathologically enlarged mediastinal or hilar lymph nodes. Esophagus is unremarkable in appearance. Enlarged left axillary lymph nodes measuring up to 1.8 cm in short axis. No right axillary  lymphadenopathy. Lungs/Pleura: No large suspicious appearing pulmonary nodules or masses are noted. Smaller pulmonary nodules are poorly evaluated on today's motion limited examination. No confluent consolidative airspace disease. No pleural effusions. Dependent areas of atelectasis and/or scarring noted throughout the lungs bilaterally. Musculoskeletal: There are no aggressive appearing lytic or blastic lesions noted  in the visualized portions of the skeleton. Review of the MIP images confirms the above findings. CT ABDOMEN and PELVIS FINDINGS Hepatobiliary: No suspicious cystic or solid hepatic lesions. No intra or extrahepatic biliary ductal dilatation. Status post cholecystectomy. Pancreas: No pancreatic mass. No pancreatic ductal dilatation. No pancreatic or peripancreatic fluid collections or inflammatory changes. Spleen: Spleen is enlarged measuring 11.0 x 5.6 x 16.4 cm (estimated splenic volume of 505 mL). Adrenals/Urinary Tract: Exophytic 1.5 cm low-attenuation lesion in the lateral aspect of the interpolar region of the right kidney is compatible with a simple cyst. Left kidney and bilateral adrenal glands are normal in appearance. No hydroureteronephrosis. Urinary bladder is normal in appearance. Stomach/Bowel: Normal appearance of the stomach. No pathologic dilatation of small bowel or colon. A few scattered colonic diverticulae are noted, without surrounding inflammatory changes to suggest an acute diverticulitis at this time. Normal appendix. Vascular/Lymphatic: Aortic atherosclerosis, without evidence of aneurysm or dissection in the abdominal or pelvic vasculature. No lymphadenopathy noted in the abdomen or pelvis. Reproductive: Status post hysterectomy. Ovaries are not confidently identified may be surgically absent or atrophic. Other: No significant volume of ascites.  No pneumoperitoneum. Musculoskeletal: There are no aggressive appearing lytic or blastic lesions noted in the visualized portions  of the skeleton. Review of the MIP images confirms the above findings. IMPRESSION: 1. Despite the limitations of today's examination, there is no evidence to suggest clinically relevant central, lobar or segmental sized pulmonary embolism. 2. No acute findings are noted in the abdomen or pelvis to account for the patient's symptoms. 3. Multiple borderline enlarged and mildly enlarged left axillary lymph nodes. No lymphadenopathy noted elsewhere in the chest, abdomen or pelvis. 4. Splenomegaly. Electronically Signed   By: Vinnie Langton M.D.   On: 03/30/2020 05:41   CT ABDOMEN PELVIS W CONTRAST  Result Date: 03/30/2020 CLINICAL DATA:  79 year old female with history of severe chest pain and hemoptysis. Nausea and vomiting. Blood in emesis. Recent history of chemotherapy for lymphoma. EXAM: CT ANGIOGRAPHY CHEST CT ABDOMEN AND PELVIS WITH CONTRAST TECHNIQUE: Multidetector CT imaging of the chest was performed using the standard protocol during bolus administration of intravenous contrast. Multiplanar CT image reconstructions and MIPs were obtained to evaluate the vascular anatomy. Multidetector CT imaging of the abdomen and pelvis was performed using the standard protocol during bolus administration of intravenous contrast. CONTRAST:  54mL OMNIPAQUE IOHEXOL 350 MG/ML SOLN COMPARISON:  CT the chest, abdomen and pelvis 11/29/2009. FINDINGS: CTA CHEST FINDINGS Cardiovascular: Today's study is limited by patient respiratory motion. With these limitations in mind, there are no central, lobar or segmental sized filling defects within the pulmonary arterial tree to suggest clinically relevant pulmonary embolism. Smaller distal subsegmental sized pulmonary embolism cannot be entirely excluded. Heart size is normal. There is no significant pericardial fluid, thickening or pericardial calcification. There is aortic atherosclerosis, as well as atherosclerosis of the great vessels of the mediastinum and the coronary  arteries, including calcified atherosclerotic plaque in the left main and left anterior descending coronary arteries. Right internal jugular single-lumen porta cath with tip terminating in the right atrium. Mediastinum/Nodes: No pathologically enlarged mediastinal or hilar lymph nodes. Esophagus is unremarkable in appearance. Enlarged left axillary lymph nodes measuring up to 1.8 cm in short axis. No right axillary lymphadenopathy. Lungs/Pleura: No large suspicious appearing pulmonary nodules or masses are noted. Smaller pulmonary nodules are poorly evaluated on today's motion limited examination. No confluent consolidative airspace disease. No pleural effusions. Dependent areas of atelectasis and/or scarring noted throughout the lungs bilaterally.  Musculoskeletal: There are no aggressive appearing lytic or blastic lesions noted in the visualized portions of the skeleton. Review of the MIP images confirms the above findings. CT ABDOMEN and PELVIS FINDINGS Hepatobiliary: No suspicious cystic or solid hepatic lesions. No intra or extrahepatic biliary ductal dilatation. Status post cholecystectomy. Pancreas: No pancreatic mass. No pancreatic ductal dilatation. No pancreatic or peripancreatic fluid collections or inflammatory changes. Spleen: Spleen is enlarged measuring 11.0 x 5.6 x 16.4 cm (estimated splenic volume of 505 mL). Adrenals/Urinary Tract: Exophytic 1.5 cm low-attenuation lesion in the lateral aspect of the interpolar region of the right kidney is compatible with a simple cyst. Left kidney and bilateral adrenal glands are normal in appearance. No hydroureteronephrosis. Urinary bladder is normal in appearance. Stomach/Bowel: Normal appearance of the stomach. No pathologic dilatation of small bowel or colon. A few scattered colonic diverticulae are noted, without surrounding inflammatory changes to suggest an acute diverticulitis at this time. Normal appendix. Vascular/Lymphatic: Aortic atherosclerosis,  without evidence of aneurysm or dissection in the abdominal or pelvic vasculature. No lymphadenopathy noted in the abdomen or pelvis. Reproductive: Status post hysterectomy. Ovaries are not confidently identified may be surgically absent or atrophic. Other: No significant volume of ascites.  No pneumoperitoneum. Musculoskeletal: There are no aggressive appearing lytic or blastic lesions noted in the visualized portions of the skeleton. Review of the MIP images confirms the above findings. IMPRESSION: 1. Despite the limitations of today's examination, there is no evidence to suggest clinically relevant central, lobar or segmental sized pulmonary embolism. 2. No acute findings are noted in the abdomen or pelvis to account for the patient's symptoms. 3. Multiple borderline enlarged and mildly enlarged left axillary lymph nodes. No lymphadenopathy noted elsewhere in the chest, abdomen or pelvis. 4. Splenomegaly. Electronically Signed   By: Vinnie Langton M.D.   On: 03/30/2020 05:41   DG Chest Port 1 View  Result Date: 03/30/2020 CLINICAL DATA:  79 year old female with chest pain, recently began chemotherapy for lymphoma. EXAM: PORTABLE CHEST 1 VIEW COMPARISON:  Chest radiographs 08/05/2018 and earlier. FINDINGS: Portable AP semi upright view at 0331 hours. Right chest power port in place. Lower lung volumes. Mediastinal contours remain normal. Visualized tracheal air column is within normal limits. Allowing for portable technique the lungs are clear. Paucity of bowel gas in the upper abdomen. Right upper quadrant cholecystectomy clips. No acute osseous abnormality identified. IMPRESSION: No acute cardiopulmonary abnormality. Electronically Signed   By: Genevie Ann M.D.   On: 03/30/2020 03:43    EKG: Independently reviewed. 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 Hematemesis 1. Multiple bouts of emesis the day of initial visit 2. Hgb currently stable at 10.6, was 10.4 on 7/14 3. Follow CBC trends 4. Pt has been complaining of abd "bloating" over past week with generalized abd pain, radiating to chest 5. Most recent EGD was on 03/30/15 by Dr. Gala Romney for f/u of Schatzki's ring and ulcerative reflux esophagitis. On that study, pt was s/p dilation with findings of healed ulcer 6. Given active bleeding and abd pain, will consult GI for further recs 7. Will keep NPO except meds for now 8. Continue on  PPI gtt as already ordered 2. HTN 1. BP stable, albeit suboptimally controlled 2. Pt was previously prescribed norvasc but has ot been taking because she did not believe her BP was high enough to require BP meds 3. Pt agreeable to resuming norvasc now 3. CK3 1. Cr stable at this time 2. Repeat bmet in AM 4. DM2 1. Most recent a1c 5.7 on 12/29/19 2. Reports diet controlled prior to admit 3. Will continue on SSI 5. Lymphoma 1. Recently started chemo, followed by Dr. Lorenso Courier 2. Continue as per Oncology  DVT prophylaxis: SCD's  Code Status: Full Family Communication: Pt in room , daughter at bedside Disposition Plan:   Consults called: GI Admission status: Observation as would likely require less than 2 midnight stay to work up hematemesis   Marylu Lund MD [facility] Pager On Amion  If 7PM-7AM, please contact night-coverage  03/30/2020, 8:02 AM

## 2020-03-31 DIAGNOSIS — K920 Hematemesis: Secondary | ICD-10-CM | POA: Diagnosis not present

## 2020-03-31 DIAGNOSIS — Z8249 Family history of ischemic heart disease and other diseases of the circulatory system: Secondary | ICD-10-CM | POA: Diagnosis not present

## 2020-03-31 DIAGNOSIS — E1122 Type 2 diabetes mellitus with diabetic chronic kidney disease: Secondary | ICD-10-CM

## 2020-03-31 DIAGNOSIS — N2581 Secondary hyperparathyroidism of renal origin: Secondary | ICD-10-CM

## 2020-03-31 DIAGNOSIS — Z885 Allergy status to narcotic agent status: Secondary | ICD-10-CM | POA: Diagnosis not present

## 2020-03-31 DIAGNOSIS — N183 Chronic kidney disease, stage 3 unspecified: Secondary | ICD-10-CM

## 2020-03-31 DIAGNOSIS — I10 Essential (primary) hypertension: Secondary | ICD-10-CM | POA: Diagnosis not present

## 2020-03-31 DIAGNOSIS — Z20822 Contact with and (suspected) exposure to covid-19: Secondary | ICD-10-CM | POA: Diagnosis present

## 2020-03-31 DIAGNOSIS — M8580 Other specified disorders of bone density and structure, unspecified site: Secondary | ICD-10-CM | POA: Diagnosis present

## 2020-03-31 DIAGNOSIS — Z809 Family history of malignant neoplasm, unspecified: Secondary | ICD-10-CM | POA: Diagnosis not present

## 2020-03-31 DIAGNOSIS — K2100 Gastro-esophageal reflux disease with esophagitis, without bleeding: Secondary | ICD-10-CM | POA: Diagnosis not present

## 2020-03-31 DIAGNOSIS — Z79899 Other long term (current) drug therapy: Secondary | ICD-10-CM | POA: Diagnosis not present

## 2020-03-31 DIAGNOSIS — Z83438 Family history of other disorder of lipoprotein metabolism and other lipidemia: Secondary | ICD-10-CM | POA: Diagnosis not present

## 2020-03-31 DIAGNOSIS — I129 Hypertensive chronic kidney disease with stage 1 through stage 4 chronic kidney disease, or unspecified chronic kidney disease: Secondary | ICD-10-CM | POA: Diagnosis present

## 2020-03-31 DIAGNOSIS — K2211 Ulcer of esophagus with bleeding: Secondary | ICD-10-CM | POA: Diagnosis present

## 2020-03-31 DIAGNOSIS — E782 Mixed hyperlipidemia: Secondary | ICD-10-CM

## 2020-03-31 DIAGNOSIS — J4520 Mild intermittent asthma, uncomplicated: Secondary | ICD-10-CM

## 2020-03-31 DIAGNOSIS — E118 Type 2 diabetes mellitus with unspecified complications: Secondary | ICD-10-CM

## 2020-03-31 DIAGNOSIS — E1169 Type 2 diabetes mellitus with other specified complication: Secondary | ICD-10-CM | POA: Diagnosis not present

## 2020-03-31 DIAGNOSIS — C8580 Other specified types of non-Hodgkin lymphoma, unspecified site: Secondary | ICD-10-CM

## 2020-03-31 DIAGNOSIS — K2101 Gastro-esophageal reflux disease with esophagitis, with bleeding: Secondary | ICD-10-CM

## 2020-03-31 DIAGNOSIS — Z833 Family history of diabetes mellitus: Secondary | ICD-10-CM | POA: Diagnosis not present

## 2020-03-31 DIAGNOSIS — Z8261 Family history of arthritis: Secondary | ICD-10-CM | POA: Diagnosis not present

## 2020-03-31 DIAGNOSIS — C8510 Unspecified B-cell lymphoma, unspecified site: Secondary | ICD-10-CM | POA: Diagnosis present

## 2020-03-31 DIAGNOSIS — Z79890 Hormone replacement therapy (postmenopausal): Secondary | ICD-10-CM | POA: Diagnosis not present

## 2020-03-31 DIAGNOSIS — E039 Hypothyroidism, unspecified: Secondary | ICD-10-CM | POA: Diagnosis present

## 2020-03-31 DIAGNOSIS — Z8673 Personal history of transient ischemic attack (TIA), and cerebral infarction without residual deficits: Secondary | ICD-10-CM | POA: Diagnosis not present

## 2020-03-31 DIAGNOSIS — Z91041 Radiographic dye allergy status: Secondary | ICD-10-CM | POA: Diagnosis not present

## 2020-03-31 DIAGNOSIS — M533 Sacrococcygeal disorders, not elsewhere classified: Secondary | ICD-10-CM | POA: Diagnosis present

## 2020-03-31 LAB — COMPREHENSIVE METABOLIC PANEL
ALT: 12 U/L (ref 0–44)
AST: 13 U/L — ABNORMAL LOW (ref 15–41)
Albumin: 3.5 g/dL (ref 3.5–5.0)
Alkaline Phosphatase: 39 U/L (ref 38–126)
Anion gap: 10 (ref 5–15)
BUN: 26 mg/dL — ABNORMAL HIGH (ref 8–23)
CO2: 20 mmol/L — ABNORMAL LOW (ref 22–32)
Calcium: 8.2 mg/dL — ABNORMAL LOW (ref 8.9–10.3)
Chloride: 104 mmol/L (ref 98–111)
Creatinine, Ser: 1.29 mg/dL — ABNORMAL HIGH (ref 0.44–1.00)
GFR calc Af Amer: 46 mL/min — ABNORMAL LOW (ref >60)
GFR calc non Af Amer: 40 mL/min — ABNORMAL LOW (ref >60)
Glucose, Bld: 124 mg/dL — ABNORMAL HIGH (ref 70–99)
Potassium: 3.9 mmol/L (ref 3.5–5.1)
Sodium: 134 mmol/L — ABNORMAL LOW (ref 135–145)
Total Bilirubin: 0.8 mg/dL (ref 0.3–1.2)
Total Protein: 5.8 g/dL — ABNORMAL LOW (ref 6.5–8.1)

## 2020-03-31 LAB — GLUCOSE, CAPILLARY
Glucose-Capillary: 103 mg/dL — ABNORMAL HIGH (ref 70–99)
Glucose-Capillary: 107 mg/dL — ABNORMAL HIGH (ref 70–99)
Glucose-Capillary: 123 mg/dL — ABNORMAL HIGH (ref 70–99)
Glucose-Capillary: 144 mg/dL — ABNORMAL HIGH (ref 70–99)
Glucose-Capillary: 150 mg/dL — ABNORMAL HIGH (ref 70–99)
Glucose-Capillary: 82 mg/dL (ref 70–99)

## 2020-03-31 LAB — CBC
HCT: 25.2 % — ABNORMAL LOW (ref 36.0–46.0)
Hemoglobin: 8.5 g/dL — ABNORMAL LOW (ref 12.0–15.0)
MCH: 27.3 pg (ref 26.0–34.0)
MCHC: 33.7 g/dL (ref 30.0–36.0)
MCV: 81 fL (ref 80.0–100.0)
Platelets: 104 10*3/uL — ABNORMAL LOW (ref 150–400)
RBC: 3.11 MIL/uL — ABNORMAL LOW (ref 3.87–5.11)
RDW: 14.9 % (ref 11.5–15.5)
WBC: 7.7 10*3/uL (ref 4.0–10.5)
nRBC: 0 % (ref 0.0–0.2)

## 2020-03-31 LAB — ABO/RH: ABO/RH(D): A POS

## 2020-03-31 LAB — HEMOGLOBIN AND HEMATOCRIT, BLOOD
HCT: 29.5 % — ABNORMAL LOW (ref 36.0–46.0)
HCT: 29.2 % — ABNORMAL LOW (ref 36.0–46.0)
Hemoglobin: 9.8 g/dL — ABNORMAL LOW (ref 12.0–15.0)
Hemoglobin: 9.8 g/dL — ABNORMAL LOW (ref 12.0–15.0)

## 2020-03-31 LAB — TYPE AND SCREEN
ABO/RH(D): A POS
Antibody Screen: NEGATIVE

## 2020-03-31 MED ORDER — SODIUM CHLORIDE 0.9% FLUSH
10.0000 mL | Freq: Two times a day (BID) | INTRAVENOUS | Status: DC
  Administered 2020-04-01: 10 mL

## 2020-03-31 MED ORDER — CHLORHEXIDINE GLUCONATE CLOTH 2 % EX PADS
6.0000 | MEDICATED_PAD | Freq: Every day | CUTANEOUS | Status: DC
  Administered 2020-03-31: 6 via TOPICAL

## 2020-03-31 NOTE — Progress Notes (Signed)
Overnight decline in hemoglobin from 10.6 to 8.5 noted.    During this time, however, the patient's BUN has also dropped from 30 to 26, so I suspect equilibration rather than any ongoing bleeding.    For comparison, the patient's baseline hemoglobin is usually around 12.5.    Patient currently on pantoprazole infusion.  Patient is not eating much; she had a small amount of cereal today.  She states she really is not interested in eating.   Impression:  1.  History of hematemesis with recent further drop in hemoglobin which I think reflects equilibration, doubt any further active bleeding (see above discussion)  2.  Severe erosive reflux esophagitis  3.  Anorexia, possibly related to lymphoma and/or recent chemo  4.  Some degree of dysphagia symptoms, where food is slow to go down, presumably related to active esophageal inflammation plus possible distal esophageal rings.  Plan:  Continue current management, recheck hemoglobin tomorrow.  Cleotis Nipper, M.D. Pager [phone] If no answer or after 5 PM call [phone]

## 2020-03-31 NOTE — Progress Notes (Signed)
PROGRESS NOTE  Elizabeth Mcintyre ZOX:[id number] DOB: 05/07/1941 DOA: 03/30/2020 PCP: Ma Hillock, DO  Brief History   Elizabeth Mcintyre is a 79 y.o. 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 Patient has reported abd "bloating" over the past week. Denies BRBPR, nausea. Does complain of constipation. Describes teaspoons full of blood per episode. Did report generalized abd pain across abdomen and into the epigastric region. Also describes pain radiating to the chest and "burping" before episode.  In the ED, pt found to have hgb of 10.6 (was 10.4 on 7/14) with BUN of 30. Cr 1.48. Hospitalist consulted for consideration for admission.  Gastroenterology was consulted. The patient underwent EGD on 03/30/2020. She was found to have moderate to severe GERD with chronic and erosive esophagitis without evidence of bleeding. There was a Schatzki ring. Recommendation is to continue protonix 40 mg bid and to repeat upper endoscopy in 2 months. Dr. Cristina Gong wishes to monitor the patient's hemoglobin until tomorrow due to her poor PO intake.  Consultants   Gastroenterology  Procedures   EGD  Antibiotics   Anti-infectives (From admission, onward)   None       Subjective  The patient is resting comfortably. No new complaints.  Objective   Vitals:  Vitals:   03/31/20 0016 03/31/20 0347  BP: (!) 154/72 (!) 151/66  Pulse: 63 61  Resp: 16 16  Temp: 98.3 F (36.8 C) (!) 97.5 F (36.4 C)  SpO2: 98% 99%   Exam:  Constitutional:   The patient is awake, alert, and oriented x 3. No acute distress. Respiratory:   No increased work of breathing.  No wheezes, rales, or rhonchi  No tactile fremitus Cardiovascular:   Regular rate and rhythm  No murmurs, ectopy, or gallups.  No lateral  PMI. No thrills. Abdomen:   Abdomen is soft, non-tender, non-distended  No hernias, masses, or organomegaly  Normoactive bowel sounds.  Musculoskeletal:   No cyanosis, clubbing, or edema Skin:   No rashes, lesions, ulcers  palpation of skin: no induration or nodules Neurologic:   CN 2-12 intact  Sensation all 4 extremities intact Psychiatric:   Mental status o Mood, affect appropriate o Orientation to person, place, time   judgment and insight appear intact   I have personally reviewed the following:   Today's Data   Vitals, CBC, H&H, BMP  Scheduled Meds:  amLODipine  10 mg Oral Daily   Chlorhexidine Gluconate Cloth  6 each Topical Daily   cholecalciferol  1,000 Units Oral Daily   insulin aspart  0-9 Units Subcutaneous Q4H   levothyroxine  75 mcg Oral Daily   sodium chloride flush  10-40 mL Intracatheter Q12H   Continuous Infusions:  sodium chloride 125 mL/hr at 03/31/20 1336   pantoprozole (PROTONIX) infusion 8 mg/hr (03/31/20 1334)    Principal Problem:   Hematemesis Active Problems:   Mild intermittent asthma   Essential hypertension, benign   Gastroesophageal reflux disease with esophagitis   Type 2 diabetes mellitus with complication (HCC)   CKD stage 3 secondary to diabetes ([place])   Combined hyperlipidemia associated with type 2 diabetes mellitus ([place])   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 The patient's most recent EGD  was on 03/30/15 by Dr. Gala Romney for f/u of Schatzki's ring and ulcerative reflux esophagitis. On that study, pt was s/p dilation with findings of healed ulcerGastroenterology was consulted and the patient underwent EGD on 03/30/2020. She was found to have moderate to severe GERD with chronic erosive esophagitis without visible bleeding. There was a schatzki ring. The patient's hemoglobin this morning was 8.3. Repeat Hgb at  1300 was 9.8. Dr. Cristina Gong has recommended monitoring the patient's hemoglobin until tomorrow am due to her poor PO intake over the past 24 hours. Continue on PPI gtt as already ordered.  HTN: The patient's blood pressure has been suboptimally controlled on her home norvasc. Will add HCTZ for better control.        CK3b: Ceratinine is likely at baseline. Monitor creatinine, electrolytes, and volume status. Avoid            nephrotoxins and hypotension.        DM2: Glucoses will be followed with FSBS and SSI. Most recent a1c 5.7 on 12/29/19. Reports diet         controlled prior to admit.      Lymphoma: Recently started chemo, followed by Dr. Lorenso Courier. Continue as per Oncology.  DVT prophylaxis: SCD's  Code Status: Full Family Communication: No family available. Disposition Plan:  Patient is from home. Anticipate discharge to home. Barriers to discharge: Need to observe stability of the patient's hemoglobin with improved PO intake.   Status is: Inpatient.  Remains inpatient appropriate because:Inpatient level of care appropriate due to severity of illness   Dispo: The patient is from: Home              Anticipated d/c is to: Home              Anticipated d/c date is: 1 day              Patient currently is not medically stable to d/c.  Elizabeth Noah, DO [facility] Direct contact: see [email or web address]  7PM-7AM contact night coverage as above 03/31/2020, 5:34 PM  LOS: 0 days

## 2020-04-01 LAB — CBC
HCT: 29 % — ABNORMAL LOW (ref 36.0–46.0)
Hemoglobin: 9.6 g/dL — ABNORMAL LOW (ref 12.0–15.0)
MCH: 27.2 pg (ref 26.0–34.0)
MCHC: 33.1 g/dL (ref 30.0–36.0)
MCV: 82.2 fL (ref 80.0–100.0)
Platelets: 117 10*3/uL — ABNORMAL LOW (ref 150–400)
RBC: 3.53 MIL/uL — ABNORMAL LOW (ref 3.87–5.11)
RDW: 15.1 % (ref 11.5–15.5)
WBC: 4.2 10*3/uL (ref 4.0–10.5)
nRBC: 0 % (ref 0.0–0.2)

## 2020-04-01 LAB — GLUCOSE, CAPILLARY
Glucose-Capillary: 118 mg/dL — ABNORMAL HIGH (ref 70–99)
Glucose-Capillary: 88 mg/dL (ref 70–99)
Glucose-Capillary: 93 mg/dL (ref 70–99)
Glucose-Capillary: 94 mg/dL (ref 70–99)

## 2020-04-01 MED ORDER — PANTOPRAZOLE SODIUM 40 MG PO TBEC
40.0000 mg | DELAYED_RELEASE_TABLET | Freq: Two times a day (BID) | ORAL | 1 refills | Status: DC
Start: 2020-04-01 — End: 2020-07-11

## 2020-04-01 MED ORDER — HEPARIN SOD (PORK) LOCK FLUSH 100 UNIT/ML IV SOLN
500.0000 [IU] | INTRAVENOUS | Status: AC | PRN
  Administered 2020-04-01: 500 [IU]
  Filled 2020-04-01: qty 5

## 2020-04-01 NOTE — Discharge Summary (Signed)
Physician Discharge Summary  Elizabeth Mcintyre VFI:[id number] DOB: 1940-12-19 DOA: 03/30/2020  PCP: Howard Pouch A, DO  Admit date: 03/30/2020 Discharge date: 04/01/2020  Recommendations for Outpatient Follow-up:  1. Discharge to home. 2. Follow up with PCP in 7-10 days. Have CBC drawn at that visit. 3. Follow up with Gastroenterology as directed. 4. Take Protonix 40 mg twice daily for 8 weeks followed by once daily. 5. Avoid NSAIDS.  Discharge Diagnoses: Principal diagnosis is #1 1. GI Bleed due to chronic and erosive esophagitis without evidence of bleeding. 2.  Schatski Ring 3. Hypertension 4. CKD3b 5. DM II 6. Lymphoma  Discharge Condition: Fair  Disposition: Home  Diet recommendation: Heart healthy  Filed Weights   03/30/20 0055 03/30/20 1316  Weight: 63.5 kg 63.5 kg    History of present illness:   Elizabeth Mcintyre is a 79 y.o. 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 Patient has reported abd "bloating" over the past week. Denies BRBPR, nausea. Does complain of constipation. Describes teaspoons full of blood per episode. Did report generalized abd pain across abdomen and into the epigastric region. Also describes pain radiating to the chest and "burping" before episode.  ED Course: In the ED, pt found to have hgb of 10.6 (was 10.4 on 7/14) with BUN of 30. Cr 1.48. Hospitalist consulted for consideration for admission  Hospital Course:  [facility] were consulted to admit the patient for further evaluation and treatment. Gastroenterology was consulted. The patient underwent EGD on 03/30/2020. She was found to have moderate to severe GERD with chronic and erosive esophagitis without evidence of bleeding. There was a Schatzki ring. Recommendation is to continue protonix  40 mg bid and to repeat upper endoscopy in 2 months. Dr. Cristina Gong wishes to monitor the patient's hemoglobin until 7/18/2021due to her poor PO intake.  Hemoglobin on 04/01/2020 was stable at 9.6. The patient will be discharged to home in fair condition.  Today's assessment: S: The patient is resting comfortably. No new complaints. O: Vitals:  Vitals:   04/01/20 0421 04/01/20 1452  BP: (!) 153/61 (!) 126/59  Pulse: 62 67  Resp: 18 18  Temp: 97.8 F (36.6 C) 98.5 F (36.9 C)  SpO2: 98% 98%    Exam:  Constitutional:  . The patient is awake, alert, and oriented x 3. No acute distress. Respiratory:  . No increased work of breathing. . No wheezes, rales, or rhonchi . No tactile fremitus Cardiovascular:  . Regular rate and rhythm . No murmurs, ectopy, or gallups. . No lateral PMI. No thrills. Abdomen:  . Abdomen is soft, non-tender, non-distended . No hernias, masses, or organomegaly . Normoactive bowel sounds.  Musculoskeletal:  . No cyanosis, clubbing, or edema Skin:  . No rashes, lesions, ulcers . palpation of skin: no induration or nodules Neurologic:  . CN 2-12 intact . Sensation all 4 extremities intact Psychiatric:  . Mental status o Mood, affect appropriate o Orientation to person, place, time  . judgment and insight appear intact  Discharge Instructions  Discharge Instructions    Activity as tolerated - No restrictions   Complete by: As directed    Call MD for:  extreme fatigue   Complete by: As directed    Call MD for:  persistant dizziness or light-headedness   Complete by: As directed  Call MD for:  persistant nausea and vomiting   Complete by: As directed    Diet - low sodium heart healthy   Complete by: As directed    Discharge instructions   Complete by: As directed    Discharge to home. Follow up with PCP in 7-10 days. Have CBC drawn at that visit. Follow up with Gastroenterology as directed. Take Protonix 40 mg twice daily for 8 weeks  followed by once daily. Avoid NSAIDS.   Increase activity slowly   Complete by: As directed      Allergies as of 04/01/2020      Reactions   Iohexol Swelling   After injection of contrast, felt like her throat was closing, difficulty swallowing, and hiccups- 0512 03/30/20 [facility]   Codeine Phosphate Nausea And Vomiting, Rash      Medication List    TAKE these medications   acetaminophen 325 MG tablet Commonly known as: TYLENOL Take 650 mg by mouth every 6 (six) hours as needed for moderate pain.   albuterol 108 (90 Base) MCG/ACT inhaler Commonly known as: VENTOLIN HFA Inhale 2 puffs into the lungs every 6 (six) hours as needed for wheezing.   allopurinol 300 MG tablet Commonly known as: ZYLOPRIM Take 1 tablet (300 mg total) by mouth daily.   amLODipine 10 MG tablet Commonly known as: NORVASC Take 1 tablet (10 mg total) by mouth daily.   dicyclomine 10 MG capsule Commonly known as: BENTYL Take 1 capsule (10 mg total) by mouth 4 (four) times daily -  before meals and at bedtime. Needs appt for further refills. What changed:   when to take this  reasons to take this  additional instructions   levothyroxine 75 MCG tablet Commonly known as: SYNTHROID Take 1 tablet (75 mcg total) by mouth daily.   lidocaine-prilocaine cream Commonly known as: EMLA Apply 1 application topically as needed.   ondansetron 8 MG tablet Commonly known as: ZOFRAN Take 1 tablet (8 mg total) by mouth every 8 (eight) hours as needed for nausea or vomiting.   pantoprazole 40 MG tablet Commonly known as: Protonix Take 1 tablet (40 mg total) by mouth 2 (two) times daily. After 8 weeks take only once daily.   prochlorperazine 10 MG tablet Commonly known as: COMPAZINE Take 1 tablet (10 mg total) by mouth every 6 (six) hours as needed for nausea or vomiting.   vitamin B-12 1000 MCG tablet Commonly known as: CYANOCOBALAMIN Take 1,000 mcg by mouth daily.   Vitamin D3 25 MCG (1000 UT) Caps Take  1,000 Units by mouth daily.      Allergies  Allergen Reactions  . Iohexol Swelling    After injection of contrast, felt like her throat was closing, difficulty swallowing, and hiccups- 0512 03/30/20 [facility]  . Codeine Phosphate Nausea And Vomiting and Rash    The results of significant diagnostics from this hospitalization (including imaging, microbiology, ancillary and laboratory) are listed below for reference.    Significant Diagnostic Studies: CT Angio Chest PE W and/or Wo Contrast  Result Date: 03/30/2020 CLINICAL DATA:  79 year old female with history of severe chest pain and hemoptysis. Nausea and vomiting. Blood in emesis. Recent history of chemotherapy for lymphoma. EXAM: CT ANGIOGRAPHY CHEST CT ABDOMEN AND PELVIS WITH CONTRAST TECHNIQUE: Multidetector CT imaging of the chest was performed using the standard protocol during bolus administration of intravenous contrast. Multiplanar CT image reconstructions and MIPs were obtained to evaluate the vascular anatomy. Multidetector CT imaging of the abdomen and pelvis was performed  using the standard protocol during bolus administration of intravenous contrast. CONTRAST:  38mL OMNIPAQUE IOHEXOL 350 MG/ML SOLN COMPARISON:  CT the chest, abdomen and pelvis 11/29/2009. FINDINGS: CTA CHEST FINDINGS Cardiovascular: Today's study is limited by patient respiratory motion. With these limitations in mind, there are no central, lobar or segmental sized filling defects within the pulmonary arterial tree to suggest clinically relevant pulmonary embolism. Smaller distal subsegmental sized pulmonary embolism cannot be entirely excluded. Heart size is normal. There is no significant pericardial fluid, thickening or pericardial calcification. There is aortic atherosclerosis, as well as atherosclerosis of the great vessels of the mediastinum and the coronary arteries, including calcified atherosclerotic plaque in the left main and left anterior descending coronary  arteries. Right internal jugular single-lumen porta cath with tip terminating in the right atrium. Mediastinum/Nodes: No pathologically enlarged mediastinal or hilar lymph nodes. Esophagus is unremarkable in appearance. Enlarged left axillary lymph nodes measuring up to 1.8 cm in short axis. No right axillary lymphadenopathy. Lungs/Pleura: No large suspicious appearing pulmonary nodules or masses are noted. Smaller pulmonary nodules are poorly evaluated on today's motion limited examination. No confluent consolidative airspace disease. No pleural effusions. Dependent areas of atelectasis and/or scarring noted throughout the lungs bilaterally. Musculoskeletal: There are no aggressive appearing lytic or blastic lesions noted in the visualized portions of the skeleton. Review of the MIP images confirms the above findings. CT ABDOMEN and PELVIS FINDINGS Hepatobiliary: No suspicious cystic or solid hepatic lesions. No intra or extrahepatic biliary ductal dilatation. Status post cholecystectomy. Pancreas: No pancreatic mass. No pancreatic ductal dilatation. No pancreatic or peripancreatic fluid collections or inflammatory changes. Spleen: Spleen is enlarged measuring 11.0 x 5.6 x 16.4 cm (estimated splenic volume of 505 mL). Adrenals/Urinary Tract: Exophytic 1.5 cm low-attenuation lesion in the lateral aspect of the interpolar region of the right kidney is compatible with a simple cyst. Left kidney and bilateral adrenal glands are normal in appearance. No hydroureteronephrosis. Urinary bladder is normal in appearance. Stomach/Bowel: Normal appearance of the stomach. No pathologic dilatation of small bowel or colon. A few scattered colonic diverticulae are noted, without surrounding inflammatory changes to suggest an acute diverticulitis at this time. Normal appendix. Vascular/Lymphatic: Aortic atherosclerosis, without evidence of aneurysm or dissection in the abdominal or pelvic vasculature. No lymphadenopathy noted in  the abdomen or pelvis. Reproductive: Status post hysterectomy. Ovaries are not confidently identified may be surgically absent or atrophic. Other: No significant volume of ascites.  No pneumoperitoneum. Musculoskeletal: There are no aggressive appearing lytic or blastic lesions noted in the visualized portions of the skeleton. Review of the MIP images confirms the above findings. IMPRESSION: 1. Despite the limitations of today's examination, there is no evidence to suggest clinically relevant central, lobar or segmental sized pulmonary embolism. 2. No acute findings are noted in the abdomen or pelvis to account for the patient's symptoms. 3. Multiple borderline enlarged and mildly enlarged left axillary lymph nodes. No lymphadenopathy noted elsewhere in the chest, abdomen or pelvis. 4. Splenomegaly. Electronically Signed   By: Vinnie Langton M.D.   On: 03/30/2020 05:41   CT ABDOMEN PELVIS W CONTRAST  Result Date: 03/30/2020 CLINICAL DATA:  79 year old female with history of severe chest pain and hemoptysis. Nausea and vomiting. Blood in emesis. Recent history of chemotherapy for lymphoma. EXAM: CT ANGIOGRAPHY CHEST CT ABDOMEN AND PELVIS WITH CONTRAST TECHNIQUE: Multidetector CT imaging of the chest was performed using the standard protocol during bolus administration of intravenous contrast. Multiplanar CT image reconstructions and MIPs were obtained to evaluate the vascular  anatomy. Multidetector CT imaging of the abdomen and pelvis was performed using the standard protocol during bolus administration of intravenous contrast. CONTRAST:  48mL OMNIPAQUE IOHEXOL 350 MG/ML SOLN COMPARISON:  CT the chest, abdomen and pelvis 11/29/2009. FINDINGS: CTA CHEST FINDINGS Cardiovascular: Today's study is limited by patient respiratory motion. With these limitations in mind, there are no central, lobar or segmental sized filling defects within the pulmonary arterial tree to suggest clinically relevant pulmonary embolism.  Smaller distal subsegmental sized pulmonary embolism cannot be entirely excluded. Heart size is normal. There is no significant pericardial fluid, thickening or pericardial calcification. There is aortic atherosclerosis, as well as atherosclerosis of the great vessels of the mediastinum and the coronary arteries, including calcified atherosclerotic plaque in the left main and left anterior descending coronary arteries. Right internal jugular single-lumen porta cath with tip terminating in the right atrium. Mediastinum/Nodes: No pathologically enlarged mediastinal or hilar lymph nodes. Esophagus is unremarkable in appearance. Enlarged left axillary lymph nodes measuring up to 1.8 cm in short axis. No right axillary lymphadenopathy. Lungs/Pleura: No large suspicious appearing pulmonary nodules or masses are noted. Smaller pulmonary nodules are poorly evaluated on today's motion limited examination. No confluent consolidative airspace disease. No pleural effusions. Dependent areas of atelectasis and/or scarring noted throughout the lungs bilaterally. Musculoskeletal: There are no aggressive appearing lytic or blastic lesions noted in the visualized portions of the skeleton. Review of the MIP images confirms the above findings. CT ABDOMEN and PELVIS FINDINGS Hepatobiliary: No suspicious cystic or solid hepatic lesions. No intra or extrahepatic biliary ductal dilatation. Status post cholecystectomy. Pancreas: No pancreatic mass. No pancreatic ductal dilatation. No pancreatic or peripancreatic fluid collections or inflammatory changes. Spleen: Spleen is enlarged measuring 11.0 x 5.6 x 16.4 cm (estimated splenic volume of 505 mL). Adrenals/Urinary Tract: Exophytic 1.5 cm low-attenuation lesion in the lateral aspect of the interpolar region of the right kidney is compatible with a simple cyst. Left kidney and bilateral adrenal glands are normal in appearance. No hydroureteronephrosis. Urinary bladder is normal in  appearance. Stomach/Bowel: Normal appearance of the stomach. No pathologic dilatation of small bowel or colon. A few scattered colonic diverticulae are noted, without surrounding inflammatory changes to suggest an acute diverticulitis at this time. Normal appendix. Vascular/Lymphatic: Aortic atherosclerosis, without evidence of aneurysm or dissection in the abdominal or pelvic vasculature. No lymphadenopathy noted in the abdomen or pelvis. Reproductive: Status post hysterectomy. Ovaries are not confidently identified may be surgically absent or atrophic. Other: No significant volume of ascites.  No pneumoperitoneum. Musculoskeletal: There are no aggressive appearing lytic or blastic lesions noted in the visualized portions of the skeleton. Review of the MIP images confirms the above findings. IMPRESSION: 1. Despite the limitations of today's examination, there is no evidence to suggest clinically relevant central, lobar or segmental sized pulmonary embolism. 2. No acute findings are noted in the abdomen or pelvis to account for the patient's symptoms. 3. Multiple borderline enlarged and mildly enlarged left axillary lymph nodes. No lymphadenopathy noted elsewhere in the chest, abdomen or pelvis. 4. Splenomegaly. Electronically Signed   By: Vinnie Langton M.D.   On: 03/30/2020 05:41   DG Chest Port 1 View  Result Date: 03/30/2020 CLINICAL DATA:  79 year old female with chest pain, recently began chemotherapy for lymphoma. EXAM: PORTABLE CHEST 1 VIEW COMPARISON:  Chest radiographs 08/05/2018 and earlier. FINDINGS: Portable AP semi upright view at 0331 hours. Right chest power port in place. Lower lung volumes. Mediastinal contours remain normal. Visualized tracheal air column is within normal limits.  Allowing for portable technique the lungs are clear. Paucity of bowel gas in the upper abdomen. Right upper quadrant cholecystectomy clips. No acute osseous abnormality identified. IMPRESSION: No acute  cardiopulmonary abnormality. Electronically Signed   By: Genevie Ann M.D.   On: 03/30/2020 03:43   IR IMAGING GUIDED PORT INSERTION  Result Date: 03/13/2020 CLINICAL DATA:  Lymphoma, access chemotherapy EXAM: RIGHT INTERNAL JUGULAR SINGLE LUMEN POWER PORT CATHETER INSERTION Date:  03/13/2020 03/13/2020 1:09 pm Radiologist:  Jerilynn Mages. Daryll Brod, MD Guidance:  Ultrasound and fluoroscopic MEDICATIONS: Ancef 2 g; The antibiotic was administered within an appropriate time interval prior to skin puncture. ANESTHESIA/SEDATION: Versed 2.0 mg IV; Fentanyl 100 mcg IV; Moderate Sedation Time:  23 minutes The patient was continuously monitored during the procedure by the interventional radiology nurse under my direct supervision. FLUOROSCOPY TIME:  0 minutes, 30 seconds (4 mGy) COMPLICATIONS: None immediate. CONTRAST:  None. PROCEDURE: Informed consent was obtained from the patient following explanation of the procedure, risks, benefits and alternatives. The patient understands, agrees and consents for the procedure. All questions were addressed. A time out was performed. Maximal barrier sterile technique utilized including caps, mask, sterile gowns, sterile gloves, large sterile drape, hand hygiene, and 2% chlorhexidine scrub. Under sterile conditions and local anesthesia, right internal jugular micropuncture venous access was performed. Access was performed with ultrasound. Images were obtained for documentation of the patent right internal jugular vein. A guide wire was inserted followed by a transitional dilator. This allowed insertion of a guide wire and catheter into the IVC. Measurements were obtained from the SVC / RA junction back to the right IJ venotomy site. In the right infraclavicular chest, a subcutaneous pocket was created over the second anterior rib. This was done under sterile conditions and local anesthesia. 1% lidocaine with epinephrine was utilized for this. A 2.5 cm incision was made in the skin. Blunt  dissection was performed to create a subcutaneous pocket over the right pectoralis major muscle. The pocket was flushed with saline vigorously. There was adequate hemostasis. The port catheter was assembled and checked for leakage. The port catheter was secured in the pocket with two retention sutures. The tubing was tunneled subcutaneously to the right venotomy site and inserted into the SVC/RA junction through a valved peel-away sheath. Position was confirmed with fluoroscopy. Images were obtained for documentation. The patient tolerated the procedure well. No immediate complications. Incisions were closed in a two layer fashion with 4 - 0 Vicryl suture. Dermabond was applied to the skin. The port catheter was accessed, blood was aspirated followed by saline and heparin flushes. Needle was removed. A dry sterile dressing was applied. IMPRESSION: Ultrasound and fluoroscopically guided right internal jugular single lumen power port catheter insertion. Tip in the SVC/RA junction. Catheter ready for use. Electronically Signed   By: Jerilynn Mages.  Shick M.D.   On: 03/13/2020 13:15    Microbiology: Recent Results (from the past 240 hour(s))  SARS Coronavirus 2 by RT PCR (hospital order, performed in [facility] hospital lab) Nasopharyngeal Nasopharyngeal Swab     Status: None   Collection Time: 03/30/20  4:38 AM   Specimen: Nasopharyngeal Swab  Result Value Ref Range Status   SARS Coronavirus 2 NEGATIVE NEGATIVE Final    Comment: (NOTE) SARS-CoV-2 target nucleic acids are NOT DETECTED.  The SARS-CoV-2 RNA is generally detectable in upper and lower respiratory specimens during the acute phase of infection. The lowest concentration of SARS-CoV-2 viral copies this assay can detect is 250 copies / mL. A negative result  does not preclude SARS-CoV-2 infection and should not be used as the sole basis for treatment or other patient management decisions.  A negative result may occur with improper specimen collection /  handling, submission of specimen other than nasopharyngeal swab, presence of viral mutation(s) within the areas targeted by this assay, and inadequate number of viral copies (<250 copies / mL). A negative result must be combined with clinical observations, patient history, and epidemiological information.  Fact Sheet for Patients:   StrictlyIdeas.no  Fact Sheet for Healthcare Providers: BankingDealers.co.za  This test is not yet approved or  cleared by the [place] FDA and has been authorized for detection and/or diagnosis of SARS-CoV-2 by FDA under an Emergency Use Authorization (EUA).  This EUA will remain in effect (meaning this test can be used) for the duration of the COVID-19 declaration under Section 564(b)(1) of the Act, 21 U.S.C. section 360bbb-3(b)(1), unless the authorization is terminated or revoked sooner.  Performed at [facility], [place] [address] [place],  [address]      Labs: Basic Metabolic Panel: Recent Labs  Lab 03/28/20 0814 03/30/20 0143 03/31/20 0508  NA 133* 129* 134*  K 3.7 3.8 3.9  CL 101 95* 104  CO2 20* 21* 20*  GLUCOSE 135* 220* 124*  BUN 22 30* 26*  CREATININE 1.41* 1.48* 1.29*  CALCIUM 9.3 9.4 8.2*   Liver Function Tests: Recent Labs  Lab 03/28/20 0814 03/30/20 0143 03/31/20 0508  AST 16 25 13*  ALT 10 12 12   ALKPHOS 65 56 39  BILITOT 0.5 0.6 0.8  PROT 6.9 7.4 5.8*  ALBUMIN 3.8 4.4 3.5   Recent Labs  Lab 03/30/20 0143  LIPASE 25   No results for input(s): AMMONIA in the last 168 hours. CBC: Recent Labs  Lab 03/28/20 0814 03/28/20 0814 03/30/20 0143 03/31/20 0508 03/31/20 1300 03/31/20 1822 04/01/20 0614  WBC 5.8  --  13.3* 7.7  --   --  4.2  NEUTROABS 3.4  --   --   --   --   --   --   HGB 10.4*  --  10.6* 8.5* 9.8* 9.8* 9.6*  HCT 31.4*   < > 31.1* 25.2* 29.5* 29.2* 29.0*  MCV 80.3  --  79.9* 81.0  --   --  82.2  PLT 106*  --  124* 104*   --   --  117*   < > = values in this interval not displayed.   Cardiac Enzymes: No results for input(s): CKTOTAL, CKMB, CKMBINDEX, TROPONINI in the last 168 hours. BNP: BNP (last 3 results) No results for input(s): BNP in the last 8760 hours.  ProBNP (last 3 results) No results for input(s): PROBNP in the last 8760 hours.  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

## 2020-04-01 NOTE — Progress Notes (Signed)
Pt d/c home with daughter

## 2020-04-01 NOTE — Progress Notes (Addendum)
Hgb stable this morning.  Pt eating approx 50% of trays (per RN).  Pt feeling ok exc for some low abd discomfort she attributes to feeling like she needs to have a BM.  Some belching and minor regurgitation but no vomiting or hematemesis.  EXAM:  NAD, abd NT  Recomm:  1. [facility] for dischg (offered suppository, but pt prefers to work on her constip at home--eg, hot coffee)  2. Other recomm's are as per 03/30/2020 note. Discussed eating strategies w/ pt and dtr.  3. Will sign off; please call me prn  Cleotis Nipper, M.D. Pager [phone] If no answer or after 5 PM call [phone]

## 2020-04-02 ENCOUNTER — Telehealth: Payer: Self-pay | Admitting: Hematology and Oncology

## 2020-04-02 ENCOUNTER — Encounter (HOSPITAL_COMMUNITY): Payer: Self-pay | Admitting: Gastroenterology

## 2020-04-02 ENCOUNTER — Encounter (HOSPITAL_COMMUNITY): Payer: Self-pay | Admitting: Hematology and Oncology

## 2020-04-02 ENCOUNTER — Telehealth: Payer: Self-pay

## 2020-04-02 LAB — SURGICAL PATHOLOGY

## 2020-04-02 NOTE — Telephone Encounter (Signed)
Spoke with patients daughter, Brendia Sacks.   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   How have you been since you were released from the hospital? "She's doing okay". Daughter states emesis has resolved, continues to "belch" often. Eating very small amounts due to fear of vomiting. Encouraged fluid/food intake.    Do you understand why you were in the hospital? yes   Do you understand the discharge instructions? yes   Where were you discharged to? Home.    Items Reviewed:  Medications reviewed: no  Allergies reviewed: no  Dietary changes reviewed: yes  Referrals reviewed: yes   Functional Questionnaire:   Activities of Daily Living (ADLs):   She states they are independent in the following: ambulation, bathing and hygiene, feeding, continence, grooming, toileting and dressing States they require assistance with the following: None.    Any transportation issues/concerns?: no   Any patient concerns? no   Confirmed importance and date/time of follow-up visits scheduled yes  Provider Appointment booked with PCP on Friday, 04/06/2020.   Confirmed with patient if condition begins to worsen call PCP or go to the ER.  Patient was given the office number and encouraged to call back with question or concerns.  : yes

## 2020-04-02 NOTE — Telephone Encounter (Signed)
Scheduled appt per 7/16 sch msg - unable to reach pt . Left message with appt date and time

## 2020-04-02 NOTE — Telephone Encounter (Signed)
LM requesting call back to complete TCM and schedule hospital follow up.   

## 2020-04-03 ENCOUNTER — Other Ambulatory Visit: Payer: Self-pay | Admitting: Hematology and Oncology

## 2020-04-03 DIAGNOSIS — K920 Hematemesis: Secondary | ICD-10-CM

## 2020-04-03 DIAGNOSIS — C8580 Other specified types of non-Hodgkin lymphoma, unspecified site: Secondary | ICD-10-CM

## 2020-04-04 ENCOUNTER — Inpatient Hospital Stay: Payer: Medicare Other

## 2020-04-04 ENCOUNTER — Other Ambulatory Visit: Payer: Self-pay

## 2020-04-04 ENCOUNTER — Inpatient Hospital Stay: Payer: Medicare Other | Admitting: Hematology and Oncology

## 2020-04-04 VITALS — BP 128/58 | HR 92 | Temp 97.5°F | Resp 16 | Wt 135.1 lb

## 2020-04-04 DIAGNOSIS — C8580 Other specified types of non-Hodgkin lymphoma, unspecified site: Secondary | ICD-10-CM | POA: Diagnosis not present

## 2020-04-04 DIAGNOSIS — Z5112 Encounter for antineoplastic immunotherapy: Secondary | ICD-10-CM

## 2020-04-04 DIAGNOSIS — C8514 Unspecified B-cell lymphoma, lymph nodes of axilla and upper limb: Secondary | ICD-10-CM | POA: Diagnosis not present

## 2020-04-04 DIAGNOSIS — K920 Hematemesis: Secondary | ICD-10-CM

## 2020-04-04 DIAGNOSIS — Z95828 Presence of other vascular implants and grafts: Secondary | ICD-10-CM

## 2020-04-04 DIAGNOSIS — R61 Generalized hyperhidrosis: Secondary | ICD-10-CM | POA: Diagnosis not present

## 2020-04-04 LAB — CMP (CANCER CENTER ONLY)
ALT: 10 U/L (ref 0–44)
AST: 11 U/L — ABNORMAL LOW (ref 15–41)
Albumin: 3.9 g/dL (ref 3.5–5.0)
Alkaline Phosphatase: 45 U/L (ref 38–126)
Anion gap: 11 (ref 5–15)
BUN: 23 mg/dL (ref 8–23)
CO2: 20 mmol/L — ABNORMAL LOW (ref 22–32)
Calcium: 9.5 mg/dL (ref 8.9–10.3)
Chloride: 102 mmol/L (ref 98–111)
Creatinine: 1.69 mg/dL — ABNORMAL HIGH (ref 0.44–1.00)
GFR, Est AFR Am: 33 mL/min — ABNORMAL LOW (ref >60)
GFR, Estimated: 29 mL/min — ABNORMAL LOW (ref >60)
Glucose, Bld: 160 mg/dL — ABNORMAL HIGH (ref 70–99)
Potassium: 3.4 mmol/L — ABNORMAL LOW (ref 3.5–5.1)
Sodium: 133 mmol/L — ABNORMAL LOW (ref 135–145)
Total Bilirubin: 0.5 mg/dL (ref 0.3–1.2)
Total Protein: 6.6 g/dL (ref 6.5–8.1)

## 2020-04-04 LAB — IRON AND TIBC
Iron: 88 ug/dL (ref 41–142)
Saturation Ratios: 24 % (ref 21–57)
TIBC: 368 ug/dL (ref 236–444)
UIBC: 279 ug/dL (ref 120–384)

## 2020-04-04 LAB — SAMPLE TO BLOOD BANK

## 2020-04-04 LAB — CBC WITH DIFFERENTIAL (CANCER CENTER ONLY)
Abs Immature Granulocytes: 0.06 10*3/uL (ref 0.00–0.07)
Basophils Absolute: 0 10*3/uL (ref 0.0–0.1)
Basophils Relative: 1 %
Eosinophils Absolute: 0.2 10*3/uL (ref 0.0–0.5)
Eosinophils Relative: 6 %
HCT: 32.8 % — ABNORMAL LOW (ref 36.0–46.0)
Hemoglobin: 11.2 g/dL — ABNORMAL LOW (ref 12.0–15.0)
Immature Granulocytes: 2 %
Lymphocytes Relative: 3 %
Lymphs Abs: 0.1 10*3/uL — ABNORMAL LOW (ref 0.7–4.0)
MCH: 27.5 pg (ref 26.0–34.0)
MCHC: 34.1 g/dL (ref 30.0–36.0)
MCV: 80.4 fL (ref 80.0–100.0)
Monocytes Absolute: 0.5 10*3/uL (ref 0.1–1.0)
Monocytes Relative: 12 %
Neutro Abs: 3 10*3/uL (ref 1.7–7.7)
Neutrophils Relative %: 76 %
Platelet Count: 175 10*3/uL (ref 150–400)
RBC: 4.08 MIL/uL (ref 3.87–5.11)
RDW: 14.3 % (ref 11.5–15.5)
WBC Count: 3.9 10*3/uL — ABNORMAL LOW (ref 4.0–10.5)
nRBC: 0 % (ref 0.0–0.2)

## 2020-04-04 LAB — FERRITIN: Ferritin: 177 ng/mL (ref 11–307)

## 2020-04-04 MED ORDER — SODIUM CHLORIDE 0.9% FLUSH
10.0000 mL | INTRAVENOUS | Status: DC | PRN
  Administered 2020-04-04: 10 mL
  Filled 2020-04-04: qty 10

## 2020-04-04 MED ORDER — HEPARIN SOD (PORK) LOCK FLUSH 100 UNIT/ML IV SOLN
500.0000 [IU] | Freq: Once | INTRAVENOUS | Status: AC | PRN
  Administered 2020-04-04: 500 [IU]
  Filled 2020-04-04: qty 5

## 2020-04-05 MED ORDER — SUCRALFATE 1 G PO TABS
1.0000 g | ORAL_TABLET | Freq: Three times a day (TID) | ORAL | 3 refills | Status: DC
Start: 2020-04-05 — End: 2020-04-19

## 2020-04-06 ENCOUNTER — Ambulatory Visit (INDEPENDENT_AMBULATORY_CARE_PROVIDER_SITE_OTHER): Payer: Medicare Other | Admitting: Family Medicine

## 2020-04-06 ENCOUNTER — Other Ambulatory Visit: Payer: Self-pay

## 2020-04-06 VITALS — BP 143/75 | HR 83 | Temp 98.6°F | Ht 62.0 in | Wt 135.0 lb

## 2020-04-06 DIAGNOSIS — G2581 Restless legs syndrome: Secondary | ICD-10-CM | POA: Diagnosis not present

## 2020-04-06 DIAGNOSIS — K2101 Gastro-esophageal reflux disease with esophagitis, with bleeding: Secondary | ICD-10-CM | POA: Diagnosis not present

## 2020-04-06 DIAGNOSIS — Z9289 Personal history of other medical treatment: Secondary | ICD-10-CM

## 2020-04-06 MED ORDER — ROPINIROLE HCL 0.25 MG PO TABS: 0.2500 mg | ORAL_TABLET | Freq: Every day | ORAL | 1 refills | Status: DC

## 2020-04-06 NOTE — Patient Instructions (Signed)
You will be receiving a call from Dr. Osborn Coho office to make sure you are schedule for follow up in September.  Continue the pantoprazole every 12 hours. If you run out of this medicine before you see Dr. Cristina Gong call in and I will refill for you.  We also called in requip for you. Take 1 tab about 1-3 hours before bed nightly to help with restless legs.

## 2020-04-06 NOTE — Progress Notes (Signed)
Elizabeth Mcintyre , 11-01-1940, 79 y.o., female MRN: [id number] Patient Care Team    Relationship Specialty Notifications Start End  Ma Hillock, DO PCP - General Family Medicine  06/08/19   Danie Binder, MD (Inactive) Consulting Physician Gastroenterology  01/29/15   Daneil Dolin, MD Consulting Physician Gastroenterology  05/14/15   Minus Breeding, MD Consulting Physician Cardiology  01/30/17   Annitta Needs, NP  Gastroenterology  03/13/17   Carlis Stable, NP Nurse Practitioner Gastroenterology  09/29/18     Chief Complaint  Patient presents with  . Hospitalization Follow-up     Subjective:  Elizabeth Mcintyre  is a 79 y.o. female presents for hospital follow up after recent admission on 03/30/2020 for primary diagnosis GI bleed due to chronic and erosive esophagitis.  Patient was discharged on 04/01/2020 to home. Patients discharge summary has been reviewed, as well as all labs/image studies obtained during hospitalization.   Patients hospital course: Patient had noted the day after starting chemo she was "spitting up blood.  "Endorsed abdominal bloating and constipation, epigastric pain and belching.  Patient underwent EGD and was found to have moderate to severe GERD with chronic and erosive esophagitis without evidence of bleeding.  There was a Schatzki's ring.  Protonix 40 mg twice daily was prescribed.  Patient's hemoglobin had been trending up on the day of discharge. Since hospital discharge patient reports he has continued the Protonix 40 mg twice daily.  She is avoiding all NSAIDs.  She has not had any additional symptoms.  Overall she is feeling well and tolerating p.o. without difficulty.  Her CBC has already been repeated at her oncology office.  Hemoglobin is stable.  During her hospital admission she was seen by Dr. Cristina Gong who performed her EGD.  She is to have a repeat EGD in 2 months, and she would like to go back to Dr. Cristina Gong for her repeat EGD.  He had communicated to her  and the hospital that if she would like him to repeat the EGD he would be take her on as a patient.  No results for input(s): HGB, HCT, WBC, PLT in the last 168 hours. CMP Latest Ref Rng & Units 04/04/2020 03/31/2020 03/30/2020  Glucose 70 - 99 mg/dL 160(H) 124(H) 220(H)  BUN 8 - 23 mg/dL 23 26(H) 30(H)  Creatinine 0.44 - 1.00 mg/dL 1.69(H) 1.29(H) 1.48(H)  Sodium 135 - 145 mmol/L 133(L) 134(L) 129(L)  Potassium 3.5 - 5.1 mmol/L 3.4(L) 3.9 3.8  Chloride 98 - 111 mmol/L 102 104 95(L)  CO2 22 - 32 mmol/L 20(L) 20(L) 21(L)  Calcium 8.9 - 10.3 mg/dL 9.5 8.2(L) 9.4  Total Protein 6.5 - 8.1 g/dL 6.6 5.8(L) 7.4  Total Bilirubin 0.3 - 1.2 mg/dL 0.5 0.8 0.6  Alkaline Phos 38 - 126 U/L 45 39 56  AST 15 - 41 U/L 11(L) 13(L) 25  ALT 0 - 44 U/L 10 12 12       CT Angio Chest PE W and/or Wo Contrast  Result Date: 03/30/2020 CLINICAL DATA:  79 year old female with history of severe chest pain and hemoptysis. Nausea and vomiting. Blood in emesis. Recent history of chemotherapy for lymphoma. EXAM: CT ANGIOGRAPHY CHEST CT ABDOMEN AND PELVIS WITH CONTRAST TECHNIQUE: Multidetector CT imaging of the chest was performed using the standard protocol during bolus administration of intravenous contrast. Multiplanar CT image reconstructions and MIPs were obtained to evaluate the vascular anatomy. Multidetector CT imaging of the abdomen and pelvis was performed using the  standard protocol during bolus administration of intravenous contrast. CONTRAST:  39mL OMNIPAQUE IOHEXOL 350 MG/ML SOLN COMPARISON:  CT the chest, abdomen and pelvis 11/29/2009. FINDINGS: CTA CHEST FINDINGS Cardiovascular: Today's study is limited by patient respiratory motion. With these limitations in mind, there are no central, lobar or segmental sized filling defects within the pulmonary arterial tree to suggest clinically relevant pulmonary embolism. Smaller distal subsegmental sized pulmonary embolism cannot be entirely excluded. Heart size is normal.  There is no significant pericardial fluid, thickening or pericardial calcification. There is aortic atherosclerosis, as well as atherosclerosis of the great vessels of the mediastinum and the coronary arteries, including calcified atherosclerotic plaque in the left main and left anterior descending coronary arteries. Right internal jugular single-lumen porta cath with tip terminating in the right atrium. Mediastinum/Nodes: No pathologically enlarged mediastinal or hilar lymph nodes. Esophagus is unremarkable in appearance. Enlarged left axillary lymph nodes measuring up to 1.8 cm in short axis. No right axillary lymphadenopathy. Lungs/Pleura: No large suspicious appearing pulmonary nodules or masses are noted. Smaller pulmonary nodules are poorly evaluated on today's motion limited examination. No confluent consolidative airspace disease. No pleural effusions. Dependent areas of atelectasis and/or scarring noted throughout the lungs bilaterally. Musculoskeletal: There are no aggressive appearing lytic or blastic lesions noted in the visualized portions of the skeleton. Review of the MIP images confirms the above findings. CT ABDOMEN and PELVIS FINDINGS Hepatobiliary: No suspicious cystic or solid hepatic lesions. No intra or extrahepatic biliary ductal dilatation. Status post cholecystectomy. Pancreas: No pancreatic mass. No pancreatic ductal dilatation. No pancreatic or peripancreatic fluid collections or inflammatory changes. Spleen: Spleen is enlarged measuring 11.0 x 5.6 x 16.4 cm (estimated splenic volume of 505 mL). Adrenals/Urinary Tract: Exophytic 1.5 cm low-attenuation lesion in the lateral aspect of the interpolar region of the right kidney is compatible with a simple cyst. Left kidney and bilateral adrenal glands are normal in appearance. No hydroureteronephrosis. Urinary bladder is normal in appearance. Stomach/Bowel: Normal appearance of the stomach. No pathologic dilatation of small bowel or colon. A  few scattered colonic diverticulae are noted, without surrounding inflammatory changes to suggest an acute diverticulitis at this time. Normal appendix. Vascular/Lymphatic: Aortic atherosclerosis, without evidence of aneurysm or dissection in the abdominal or pelvic vasculature. No lymphadenopathy noted in the abdomen or pelvis. Reproductive: Status post hysterectomy. Ovaries are not confidently identified may be surgically absent or atrophic. Other: No significant volume of ascites.  No pneumoperitoneum. Musculoskeletal: There are no aggressive appearing lytic or blastic lesions noted in the visualized portions of the skeleton. Review of the MIP images confirms the above findings. IMPRESSION: 1. Despite the limitations of today's examination, there is no evidence to suggest clinically relevant central, lobar or segmental sized pulmonary embolism. 2. No acute findings are noted in the abdomen or pelvis to account for the patient's symptoms. 3. Multiple borderline enlarged and mildly enlarged left axillary lymph nodes. No lymphadenopathy noted elsewhere in the chest, abdomen or pelvis. 4. Splenomegaly. Electronically Signed   By: Vinnie Langton M.D.   On: 03/30/2020 05:41   CT ABDOMEN PELVIS W CONTRAST  Result Date: 03/30/2020 CLINICAL DATA:  79 year old female with history of severe chest pain and hemoptysis. Nausea and vomiting. Blood in emesis. Recent history of chemotherapy for lymphoma. EXAM: CT ANGIOGRAPHY CHEST CT ABDOMEN AND PELVIS WITH CONTRAST TECHNIQUE: Multidetector CT imaging of the chest was performed using the standard protocol during bolus administration of intravenous contrast. Multiplanar CT image reconstructions and MIPs were obtained to evaluate the vascular anatomy. Multidetector  CT imaging of the abdomen and pelvis was performed using the standard protocol during bolus administration of intravenous contrast. CONTRAST:  35mL OMNIPAQUE IOHEXOL 350 MG/ML SOLN COMPARISON:  CT the chest,  abdomen and pelvis 11/29/2009. FINDINGS: CTA CHEST FINDINGS Cardiovascular: Today's study is limited by patient respiratory motion. With these limitations in mind, there are no central, lobar or segmental sized filling defects within the pulmonary arterial tree to suggest clinically relevant pulmonary embolism. Smaller distal subsegmental sized pulmonary embolism cannot be entirely excluded. Heart size is normal. There is no significant pericardial fluid, thickening or pericardial calcification. There is aortic atherosclerosis, as well as atherosclerosis of the great vessels of the mediastinum and the coronary arteries, including calcified atherosclerotic plaque in the left main and left anterior descending coronary arteries. Right internal jugular single-lumen porta cath with tip terminating in the right atrium. Mediastinum/Nodes: No pathologically enlarged mediastinal or hilar lymph nodes. Esophagus is unremarkable in appearance. Enlarged left axillary lymph nodes measuring up to 1.8 cm in short axis. No right axillary lymphadenopathy. Lungs/Pleura: No large suspicious appearing pulmonary nodules or masses are noted. Smaller pulmonary nodules are poorly evaluated on today's motion limited examination. No confluent consolidative airspace disease. No pleural effusions. Dependent areas of atelectasis and/or scarring noted throughout the lungs bilaterally. Musculoskeletal: There are no aggressive appearing lytic or blastic lesions noted in the visualized portions of the skeleton. Review of the MIP images confirms the above findings. CT ABDOMEN and PELVIS FINDINGS Hepatobiliary: No suspicious cystic or solid hepatic lesions. No intra or extrahepatic biliary ductal dilatation. Status post cholecystectomy. Pancreas: No pancreatic mass. No pancreatic ductal dilatation. No pancreatic or peripancreatic fluid collections or inflammatory changes. Spleen: Spleen is enlarged measuring 11.0 x 5.6 x 16.4 cm (estimated splenic  volume of 505 mL). Adrenals/Urinary Tract: Exophytic 1.5 cm low-attenuation lesion in the lateral aspect of the interpolar region of the right kidney is compatible with a simple cyst. Left kidney and bilateral adrenal glands are normal in appearance. No hydroureteronephrosis. Urinary bladder is normal in appearance. Stomach/Bowel: Normal appearance of the stomach. No pathologic dilatation of small bowel or colon. A few scattered colonic diverticulae are noted, without surrounding inflammatory changes to suggest an acute diverticulitis at this time. Normal appendix. Vascular/Lymphatic: Aortic atherosclerosis, without evidence of aneurysm or dissection in the abdominal or pelvic vasculature. No lymphadenopathy noted in the abdomen or pelvis. Reproductive: Status post hysterectomy. Ovaries are not confidently identified may be surgically absent or atrophic. Other: No significant volume of ascites.  No pneumoperitoneum. Musculoskeletal: There are no aggressive appearing lytic or blastic lesions noted in the visualized portions of the skeleton. Review of the MIP images confirms the above findings. IMPRESSION: 1. Despite the limitations of today's examination, there is no evidence to suggest clinically relevant central, lobar or segmental sized pulmonary embolism. 2. No acute findings are noted in the abdomen or pelvis to account for the patient's symptoms. 3. Multiple borderline enlarged and mildly enlarged left axillary lymph nodes. No lymphadenopathy noted elsewhere in the chest, abdomen or pelvis. 4. Splenomegaly. Electronically Signed   By: Vinnie Langton M.D.   On: 03/30/2020 05:41   DG Chest Port 1 View  Result Date: 03/30/2020 CLINICAL DATA:  79 year old female with chest pain, recently began chemotherapy for lymphoma. EXAM: PORTABLE CHEST 1 VIEW COMPARISON:  Chest radiographs 08/05/2018 and earlier. FINDINGS: Portable AP semi upright view at 0331 hours. Right chest power port in place. Lower lung volumes.  Mediastinal contours remain normal. Visualized tracheal air column is within normal limits. Allowing  for portable technique the lungs are clear. Paucity of bowel gas in the upper abdomen. Right upper quadrant cholecystectomy clips. No acute osseous abnormality identified. IMPRESSION: No acute cardiopulmonary abnormality. Electronically Signed   By: Genevie Ann M.D.   On: 03/30/2020 03:43   IR IMAGING GUIDED PORT INSERTION  Result Date: 03/13/2020 CLINICAL DATA:  Lymphoma, access chemotherapy EXAM: RIGHT INTERNAL JUGULAR SINGLE LUMEN POWER PORT CATHETER INSERTION Date:  03/13/2020 03/13/2020 1:09 pm Radiologist:  Jerilynn Mages. Daryll Brod, MD Guidance:  Ultrasound and fluoroscopic MEDICATIONS: Ancef 2 g; The antibiotic was administered within an appropriate time interval prior to skin puncture. ANESTHESIA/SEDATION: Versed 2.0 mg IV; Fentanyl 100 mcg IV; Moderate Sedation Time:  23 minutes The patient was continuously monitored during the procedure by the interventional radiology nurse under my direct supervision. FLUOROSCOPY TIME:  0 minutes, 30 seconds (4 mGy) COMPLICATIONS: None immediate. CONTRAST:  None. PROCEDURE: Informed consent was obtained from the patient following explanation of the procedure, risks, benefits and alternatives. The patient understands, agrees and consents for the procedure. All questions were addressed. A time out was performed. Maximal barrier sterile technique utilized including caps, mask, sterile gowns, sterile gloves, large sterile drape, hand hygiene, and 2% chlorhexidine scrub. Under sterile conditions and local anesthesia, right internal jugular micropuncture venous access was performed. Access was performed with ultrasound. Images were obtained for documentation of the patent right internal jugular vein. A guide wire was inserted followed by a transitional dilator. This allowed insertion of a guide wire and catheter into the IVC. Measurements were obtained from the SVC / RA junction back to  the right IJ venotomy site. In the right infraclavicular chest, a subcutaneous pocket was created over the second anterior rib. This was done under sterile conditions and local anesthesia. 1% lidocaine with epinephrine was utilized for this. A 2.5 cm incision was made in the skin. Blunt dissection was performed to create a subcutaneous pocket over the right pectoralis major muscle. The pocket was flushed with saline vigorously. There was adequate hemostasis. The port catheter was assembled and checked for leakage. The port catheter was secured in the pocket with two retention sutures. The tubing was tunneled subcutaneously to the right venotomy site and inserted into the SVC/RA junction through a valved peel-away sheath. Position was confirmed with fluoroscopy. Images were obtained for documentation. The patient tolerated the procedure well. No immediate complications. Incisions were closed in a two layer fashion with 4 - 0 Vicryl suture. Dermabond was applied to the skin. The port catheter was accessed, blood was aspirated followed by saline and heparin flushes. Needle was removed. A dry sterile dressing was applied. IMPRESSION: Ultrasound and fluoroscopically guided right internal jugular single lumen power port catheter insertion. Tip in the SVC/RA junction. Catheter ready for use. Electronically Signed   By: Jerilynn Mages.  Shick M.D.   On: 03/13/2020 13:15     Depression screen [facility] 2/9 04/06/2020 12/29/2019 09/29/2018 01/25/2018 10/15/2017  Decreased Interest 0 0 1 1 1   Down, Depressed, Hopeless 0 0 1 1 1   PHQ - 2 Score 0 0 2 2 2   Altered sleeping 3 3 3 1  -  Tired, decreased energy 3 0 2 1 -  Change in appetite 0 3 2 1  -  Feeling bad or failure about yourself  0 0 1 1 -  Trouble concentrating 0 3 0 0 -  Moving slowly or fidgety/restless 0 0 0 0 -  Suicidal thoughts 0 0 3 0 -  PHQ-9 Score 6 9 13 6  -  Difficult doing work/chores Not difficult at all Not difficult at all Somewhat difficult Somewhat difficult -    Some recent data might be hidden    Allergies  Allergen Reactions  . Iohexol Swelling    After injection of contrast, felt like her throat was closing, difficulty swallowing, and hiccups- 0512 03/30/20 [facility]  . Codeine Phosphate Nausea And Vomiting and Rash   Social History   Tobacco Use  . Smoking status: Never Smoker  . Smokeless tobacco: Never Used  Substance Use Topics  . Alcohol use: No   Past Medical History:  Diagnosis Date  . Asthma   . Bloating 04/26/2019  . Cancer ([place]) 2021   Lymphoma  . 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 DIABETES MELLITUS, TYPE II 11/09/2007   diet control  . Diverticulosis 03/2011  . GERD (gastroesophageal reflux disease)    none recently  . GI bleed   . History of rheumatoid arthritis    during 30's, was treated.  . Hyperlipidemia   . Hypertension   . IBS (irritable bowel syndrome)   . Osteopenia 2017   Last  bone density 05/04/2017: -2.4  . PONV (postoperative nausea and vomiting)   . Stress incontinence   . Stroke [facility])    mini stroke - found on a CT scan   Past Surgical History:  Procedure Laterality Date  . ABDOMINAL HYSTERECTOMY    . BIOPSY  03/30/2020   Procedure: BIOPSY;  Surgeon: Ronald Lobo, MD;  Location: [facility] ENDOSCOPY;  Service: Endoscopy;;  . CARDIAC CATHETERIZATION     X 2, last one in 1998  . CHOLECYSTECTOMY N/A 04/13/2017   Procedure: LAPAROSCOPIC CHOLECYSTECTOMY;  Surgeon: Aviva Signs, MD;  Location: [facility] ORS;  Service: General;  Laterality: N/A;  . COLONOSCOPY    . COLONOSCOPY  May 2012   Dr. Olevia Perches: mild diverticulosis, otherwise normal.   . ESOPHAGOGASTRODUODENOSCOPY N/A 01/28/2015   Dr. Gala Romney: reflux esophagitis, Schatzki's ring not manipulated due to recent bleeding  . ESOPHAGOGASTRODUODENOSCOPY N/A 03/30/2015   Dr. Gala Romney: Schatzki's ring s/p Venia Minks dilation, previously noted esophageal ulcer completely healed  . ESOPHAGOGASTRODUODENOSCOPY N/A 03/30/2020    Procedure: ESOPHAGOGASTRODUODENOSCOPY (EGD);  Surgeon: Ronald Lobo, MD;  Location: [facility];  Service: Endoscopy;  Laterality: N/A;  . IR IMAGING GUIDED PORT INSERTION  03/13/2020  . LYMPH NODE BIOPSY Left 03/20/2020   Procedure: LEFT POSTERIOR CERVICAL LYMPH NODE BIOPSY;  Surgeon: Georganna Skeans, MD;  Location: [place];  Service: General;  Laterality: Left;  Marland Kitchen MALONEY DILATION N/A 03/30/2015   Procedure: Venia Minks DILATION;  Surgeon: Daneil Dolin, MD;  Location: [facility];  Service: Endoscopy;  Laterality: N/A;   Family History  Problem Relation Age of Onset  . Heart disease Mother   . Osteoarthritis Mother   . Sudden death Father   . Single kidney Father   . Other Father        h/o severe MVA injuries  . Hyperlipidemia Sister   . Other Daughter        Myalgias  . Fibromyalgia Daughter   . Allergies Daughter   . Heart disease Maternal Grandfather   . Sudden death Paternal Grandmother   . Diabetes Paternal Grandfather   . Heart disease Daughter   . Other Daughter        palpitations  . Pulmonary fibrosis Maternal Aunt   . Cancer Paternal Uncle   . Pulmonary fibrosis Maternal Aunt   . Colon cancer  Neg Hx    Allergies as of 04/06/2020      Reactions   Iohexol Swelling   After injection of contrast, felt like her throat was closing, difficulty swallowing, and hiccups- 0512 03/30/20 [facility]   Codeine Phosphate Nausea And Vomiting, Rash      Medication List       Accurate as of April 06, 2020 11:59 PM. If you have any questions, ask your nurse or doctor.        acetaminophen 325 MG tablet Commonly known as: TYLENOL Take 650 mg by mouth every 6 (six) hours as needed for moderate pain.   albuterol 108 (90 Base) MCG/ACT inhaler Commonly known as: VENTOLIN HFA Inhale 2 puffs into the lungs every 6 (six) hours as needed for wheezing.   allopurinol 300 MG tablet Commonly known as: ZYLOPRIM Take 1 tablet (300 mg total) by mouth daily.   amLODipine 10 MG tablet Commonly  known as: NORVASC Take 1 tablet (10 mg total) by mouth daily.   dicyclomine 10 MG capsule Commonly known as: BENTYL Take 1 capsule (10 mg total) by mouth 4 (four) times daily -  before meals and at bedtime. Needs appt for further refills. What changed:   when to take this  reasons to take this  additional instructions   levothyroxine 75 MCG tablet Commonly known as: SYNTHROID Take 1 tablet (75 mcg total) by mouth daily.   lidocaine-prilocaine cream Commonly known as: EMLA Apply 1 application topically as needed.   ondansetron 8 MG tablet Commonly known as: ZOFRAN Take 1 tablet (8 mg total) by mouth every 8 (eight) hours as needed for nausea or vomiting.   pantoprazole 40 MG tablet Commonly known as: Protonix Take 1 tablet (40 mg total) by mouth 2 (two) times daily. After 8 weeks take only once daily.   prochlorperazine 10 MG tablet Commonly known as: COMPAZINE Take 1 tablet (10 mg total) by mouth every 6 (six) hours as needed for nausea or vomiting.   rOPINIRole 0.25 MG tablet Commonly known as: Requip Take 1 tablet (0.25 mg total) by mouth at bedtime. Started by: Howard Pouch, DO   sucralfate 1 g tablet Commonly known as: Carafate Take 1 tablet (1 g total) by mouth 4 (four) times daily -  with meals and at bedtime.   vitamin B-12 1000 MCG tablet Commonly known as: CYANOCOBALAMIN Take 1,000 mcg by mouth daily.   Vitamin D3 25 MCG (1000 UT) Caps Take 1,000 Units by mouth daily.       All past medical history, surgical history, allergies, family history, immunizations and medications were updated in the EMR today and reviewed under the history and medication portions of their EMR.      ROS: Negative, with the exception of above mentioned in HPI   Objective:  BP (!) 143/75   Pulse 83   Temp 98.6 F (37 C) (Temporal)   Ht 5\' 2"  (1.575 m)   Wt 135 lb (61.2 kg)   SpO2 100%   BMI 24.69 kg/m  Body mass index is 24.69 kg/m. Gen: Afebrile. No acute  distress. Nontoxic in appearance, well developed, well nourished.  HENT: AT. [place].  Eyes:Pupils Equal Round Reactive to light, Extraocular movements intact,  Conjunctiva without redness, discharge or icterus. CV: RRR no murmur, no edema Chest: CTAB, no wheeze or crackles. Good air movement, normal resp effort.  Abd: Soft.  Flat. NTND. BS present.  No masses palpated. No rebound or guarding.  Skin: No rashes, purpura or petechiae.  Neuro:  Normal gait. PERLA. EOMi. Alert. Oriented x3 Psych: Normal affect, dress and demeanor. Normal speech. Normal thought content and judgment.  Assessment/Plan: Elizabeth Mcintyre is a 79 y.o. female present for OV for Hospital discharge follow up Restless leg Patient was provided with a dose of Requip during her hospital stay and she reports it worked really well for her restless leg.  She would like to continue this in the outpatient setting if possible. Requip 0.25 mg nightly  Gastroesophageal reflux disease with esophagitis and hemorrhage/recent hospitalization Patient with recent hospitalization for GI bleed, GERD with esophagitis noted.  Hemoglobin has been stable since hospital discharge. Continue Protonix 40 mg twice daily until seen by GI in 2 months. Referral placed back to Dr. Cristina Gong, per patient preference-recommendations of repeat EGD in 2 months. Patient recovering well and has followed up with oncology since discharge as well.   Reviewed expectations re: course of current medical issues.  Discussed self-management of symptoms.  Outlined signs and symptoms indicating need for more acute intervention.  Patient verbalized understanding and all questions were answered.  Patient received an After-Visit Summary.  Any changes in medications were reviewed and patient was provided with updated med list with their AVS.      No orders of the defined types were placed in this encounter.  Meds ordered this encounter  Medications  . rOPINIRole  (REQUIP) 0.25 MG tablet    Sig: Take 1 tablet (0.25 mg total) by mouth at bedtime.    Dispense:  90 tablet    Refill:  1      Note is dictated utilizing voice recognition software. Although note has been proof read prior to signing, occasional typographical errors still can be missed. If any questions arise, please do not hesitate to call for verification.   electronically signed by:  Howard Pouch, DO  [place]

## 2020-04-08 NOTE — Progress Notes (Signed)
[place] [phone]   [phone]  PROGRESS NOTE  Patient Care Team: Ma Hillock, DO as PCP - General (Family Medicine) Danie Binder, MD (Inactive) as Consulting Physician (Gastroenterology) Gala Romney Cristopher Estimable, MD as Consulting Physician (Gastroenterology) Minus Breeding, MD as Consulting Physician (Cardiology) Annitta Needs, NP (Gastroenterology) Carlis Stable, NP as Nurse Practitioner (Gastroenterology)  Hematological/Oncological History  # Low Grade Marginal Zone Lymphoma, Stage III 1) 06/06/2019: patient underwent a diagnostic mammogram of the left breast. Calcifications noted in the left breast, recommended 6 month f/u imaging. 2) 02/02/2020: bilateral diagnostic mammogram showedabnormal enlarged lymph nodes with cortical thickening. The largest of these measures 2.2 x 1.7 x 2.0 cm. 3) 02/02/2020: US guided biopsy of left axillary lymph nodes performed. 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 4) 02/16/2020: establish care with Dr. Lorenso Courier 5) 02/29/2020: PET CT scan performed, showed hypermetabolic lymph nodes identified in the posterior left neck, both axillary/subpectoral regions, mediastinum, hila, right external iliac chain, and right groin. Massive splenomegaly of 17.8 cm noted as well.  6) 03/20/2020: excisional biopsy of left posterior cervical lymph node. Biopsy confirmed most likely low grade marginal zone lymphoma.   7) 7/14-7/15/2021: Cycle 1 Day 1 of Rituximab/Bendamycin  Interval History:  Elizabeth Mcintyre 79 y.o. 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 The patient's last visit was on 03/28/2020 at which time she started Cycle 1 Day 1 of R-Benda. In the  interim she unfortunately developed hematemesis and was admitted to [facility] for evaluation. GI evaluation found no clear source of bleeding, though esophagitis was noted.   On exam today Elizabeth Mcintyre notes she has been well since her discharge from the hospital.  She reports that she has been having some occasional episodes of diarrhea with 3-4 episodes per day that has been mostly semiformed.  She notes that the stools have been more burgundy/rust colored but she has seen no dark black stools or frank blood.  She notes that she has had no fevers or chills, but does continue to have some occasional sweats.  She notes that she is not currently having any other symptoms such as abdominal pain, cramping, or other overt signs of bleeding such as nosebleeds, bruising, or GYN bleeding.  She notes that her energy levels have been at baseline and she has had no changes in her appetite.  A full 10 point ROS is listed below.  MEDICAL HISTORY:  Past Medical History:  Diagnosis Date  . Asthma   . Bloating 04/26/2019  . Cancer ([place]) 2021   Lymphoma  . 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 DIABETES MELLITUS, TYPE II 11/09/2007   diet control  . Diverticulosis 03/2011  . GERD (gastroesophageal reflux disease)    none recently  . GI bleed   . History of rheumatoid arthritis    during 30's, was treated.  . Hyperlipidemia   . Hypertension   . IBS (irritable bowel syndrome)   . Osteopenia 2017   Last  bone density 05/04/2017: -2.4  . PONV (postoperative nausea and vomiting)   . Stress incontinence   . Stroke [facility])    mini stroke - found on a CT scan    SURGICAL HISTORY: Past Surgical  History:  Procedure Laterality Date  . ABDOMINAL HYSTERECTOMY    . BIOPSY  03/30/2020   Procedure: BIOPSY;  Surgeon: Ronald Lobo, MD;  Location: [facility] ENDOSCOPY;  Service: Endoscopy;;  . CARDIAC CATHETERIZATION     X 2, last one in 1998  . CHOLECYSTECTOMY N/A 04/13/2017    Procedure: LAPAROSCOPIC CHOLECYSTECTOMY;  Surgeon: Aviva Signs, MD;  Location: [facility] ORS;  Service: General;  Laterality: N/A;  . COLONOSCOPY    . COLONOSCOPY  May 2012   Dr. Olevia Perches: mild diverticulosis, otherwise normal.   . ESOPHAGOGASTRODUODENOSCOPY N/A 01/28/2015   Dr. Gala Romney: reflux esophagitis, Schatzki's ring not manipulated due to recent bleeding  . ESOPHAGOGASTRODUODENOSCOPY N/A 03/30/2015   Dr. Gala Romney: Schatzki's ring s/p Venia Minks dilation, previously noted esophageal ulcer completely healed  . ESOPHAGOGASTRODUODENOSCOPY N/A 03/30/2020   Procedure: ESOPHAGOGASTRODUODENOSCOPY (EGD);  Surgeon: Ronald Lobo, MD;  Location: [facility];  Service: Endoscopy;  Laterality: N/A;  . IR IMAGING GUIDED PORT INSERTION  03/13/2020  . LYMPH NODE BIOPSY Left 03/20/2020   Procedure: LEFT POSTERIOR CERVICAL LYMPH NODE BIOPSY;  Surgeon: Georganna Skeans, MD;  Location: [place];  Service: General;  Laterality: Left;  Marland Kitchen MALONEY DILATION N/A 03/30/2015   Procedure: Venia Minks DILATION;  Surgeon: Daneil Dolin, MD;  Location: [facility];  Service: Endoscopy;  Laterality: N/A;    SOCIAL HISTORY: Social History   Socioeconomic History  . Marital status: Widowed    Spouse name: Not on file  . Number of children: 2  . Years of education: Not on file  . Highest education level: Not on file  Occupational History  . Occupation: retired  Tobacco Use  . Smoking status: Never Smoker  . Smokeless tobacco: Never Used  Vaping Use  . Vaping Use: Never used  Substance and Sexual Activity  . Alcohol use: No  . Drug use: No  . Sexual activity: Never  Other Topics Concern  . Not on file  Social History Narrative   Elizabeth Mcintyre is widowed. Her young grandson lives with her, for whom she shares custody with her daughter, the son's aunt.     Social Determinants of Health   Financial Resource Strain:   . Difficulty of Paying Living Expenses:   Food Insecurity:   . Worried About Charity fundraiser in the Last  Year:   . Arboriculturist in the Last Year:   Transportation Needs:   . Film/video editor (Medical):   Marland Kitchen Lack of Transportation (Non-Medical):   Physical Activity:   . Days of Exercise per Week:   . Minutes of Exercise per Session:   Stress:   . Feeling of Stress :   Social Connections:   . Frequency of Communication with Friends and Family:   . Frequency of Social Gatherings with Friends and Family:   . Attends Religious Services:   . Active Member of Clubs or Organizations:   . Attends Archivist Meetings:   Marland Kitchen Marital Status:   Intimate Partner Violence:   . Fear of Current or Ex-Partner:   . Emotionally Abused:   Marland Kitchen Physically Abused:   . Sexually Abused:     FAMILY HISTORY: Family History  Problem Relation Age of Onset  . Heart disease Mother   . Osteoarthritis Mother   . Sudden death Father   . Single kidney Father   . Other Father        h/o severe MVA injuries  . Hyperlipidemia Sister   . Other Daughter  Myalgias  . Fibromyalgia Daughter   . Allergies Daughter   . Heart disease Maternal Grandfather   . Sudden death Paternal Grandmother   . Diabetes Paternal Grandfather   . Heart disease Daughter   . Other Daughter        palpitations  . Pulmonary fibrosis Maternal Aunt   . Cancer Paternal Uncle   . Pulmonary fibrosis Maternal Aunt   . Colon cancer Neg Hx     ALLERGIES:  is allergic to iohexol and codeine phosphate.  MEDICATIONS:  Current Outpatient Medications  Medication Sig Dispense Refill  . acetaminophen (TYLENOL) 325 MG tablet Take 650 mg by mouth every 6 (six) hours as needed for moderate pain.     Marland Kitchen albuterol (VENTOLIN HFA) 108 (90 Base) MCG/ACT inhaler Inhale 2 puffs into the lungs every 6 (six) hours as needed for wheezing. 8.5 g 2  . allopurinol (ZYLOPRIM) 300 MG tablet Take 1 tablet (300 mg total) by mouth daily. 30 tablet 3  . amLODipine (NORVASC) 10 MG tablet Take 1 tablet (10 mg total) by mouth daily. 90 tablet 1  .  Cholecalciferol (VITAMIN D3) 25 MCG (1000 UT) CAPS Take 1,000 Units by mouth daily.     Marland Kitchen dicyclomine (BENTYL) 10 MG capsule Take 1 capsule (10 mg total) by mouth 4 (four) times daily -  before meals and at bedtime. Needs appt for further refills. (Patient taking differently: Take 10 mg by mouth 3 (three) times daily as needed for spasms. ) 360 capsule 0  . levothyroxine (SYNTHROID) 75 MCG tablet Take 1 tablet (75 mcg total) by mouth daily. 90 tablet 3  . lidocaine-prilocaine (EMLA) cream Apply 1 application topically as needed. 30 g 0  . ondansetron (ZOFRAN) 8 MG tablet Take 1 tablet (8 mg total) by mouth every 8 (eight) hours as needed for nausea or vomiting. 30 tablet 1  . pantoprazole (PROTONIX) 40 MG tablet Take 1 tablet (40 mg total) by mouth 2 (two) times daily. After 8 weeks take only once daily. 60 tablet 1  . prochlorperazine (COMPAZINE) 10 MG tablet Take 1 tablet (10 mg total) by mouth every 6 (six) hours as needed for nausea or vomiting. 30 tablet 1  . rOPINIRole (REQUIP) 0.25 MG tablet Take 1 tablet (0.25 mg total) by mouth at bedtime. 90 tablet 1  . sucralfate (CARAFATE) 1 g tablet Take 1 tablet (1 g total) by mouth 4 (four) times daily -  with meals and at bedtime. 90 tablet 3  . vitamin B-12 (CYANOCOBALAMIN) 1000 MCG tablet Take 1,000 mcg by mouth daily.     No current facility-administered medications for this visit.    REVIEW OF SYSTEMS:   Constitutional: ( - ) fevers, ( - )  chills , ( - ) night sweats Eyes: ( - ) blurriness of vision, ( - ) double vision, ( - ) watery eyes Ears, nose, mouth, throat, and face: ( - ) mucositis, ( - ) sore throat Respiratory: ( - ) cough, ( - ) dyspnea, ( - ) wheezes Cardiovascular: ( - ) palpitation, ( - ) chest discomfort, ( - ) lower extremity swelling Gastrointestinal:  ( - ) nausea, ( - ) heartburn, ( - ) change in bowel habits Skin: ( - ) abnormal skin rashes Lymphatics: ( - ) new lymphadenopathy, ( - ) easy bruising Neurological: ( - )  numbness, ( - ) tingling, ( - ) new weaknesses Behavioral/Psych: ( - ) mood change, ( - ) new changes  All other  systems were reviewed with the patient and are negative.  PHYSICAL EXAMINATION: ECOG PERFORMANCE STATUS: 1 - Symptomatic but completely ambulatory  Vitals:   04/04/20 1329  BP: (!) 128/58  Pulse: 92  Resp: 16  Temp: (!) 97.5 F (36.4 C)  SpO2: 100%   Filed Weights   04/04/20 1329  Weight: 135 lb 1.6 oz (61.3 kg)    GENERAL: well appearing elderly Caucasian female in NAD  SKIN: skin color, texture, turgor are normal. Small dime sized dry skin lesions on chest, about 5 in number.  EYES: conjunctiva are pink and non-injected, sclera clear NECK: supple, non-tender. Healing incision on left neck at site of excisional biopsy.  LUNGS: clear to auscultation and percussion with normal breathing effort HEART: regular rate & rhythm and no murmurs and no lower extremity edema Musculoskeletal: no cyanosis of digits and no clubbing  PSYCH: alert & oriented x 3, fluent speech NEURO: no focal motor/sensory deficits  LABORATORY DATA:  I have reviewed the data as listed CBC Latest Ref Rng & Units 04/04/2020 04/01/2020 03/31/2020  WBC 4.0 - 10.5 K/uL 3.9(L) 4.2 -  Hemoglobin 12.0 - 15.0 g/dL 11.2(L) 9.6(L) 9.8(L)  Hematocrit 36 - 46 % 32.8(L) 29.0(L) 29.2(L)  Platelets 150 - 400 K/uL 175 117(L) -    CMP Latest Ref Rng & Units 04/04/2020 03/31/2020 03/30/2020  Glucose 70 - 99 mg/dL 160(H) 124(H) 220(H)  BUN 8 - 23 mg/dL 23 26(H) 30(H)  Creatinine 0.44 - 1.00 mg/dL 1.69(H) 1.29(H) 1.48(H)  Sodium 135 - 145 mmol/L 133(L) 134(L) 129(L)  Potassium 3.5 - 5.1 mmol/L 3.4(L) 3.9 3.8  Chloride 98 - 111 mmol/L 102 104 95(L)  CO2 22 - 32 mmol/L 20(L) 20(L) 21(L)  Calcium 8.9 - 10.3 mg/dL 9.5 8.2(L) 9.4  Total Protein 6.5 - 8.1 g/dL 6.6 5.8(L) 7.4  Total Bilirubin 0.3 - 1.2 mg/dL 0.5 0.8 0.6  Alkaline Phos 38 - 126 U/L 45 39 56  AST 15 - 41 U/L 11(L) 13(L) 25  ALT 0 - 44 U/L 10 12 12      RADIOGRAPHIC STUDIES: I have personally reviewed the radiological images as listed and agreed with the findings in the report: FDG avid spleen and axillary lymph nodes. Involvement of cervical and inguinal nodes as well.  CT Angio Chest PE W and/or Wo Contrast  Result Date: 03/30/2020 CLINICAL DATA:  79 year old female with history of severe chest pain and hemoptysis. Nausea and vomiting. Blood in emesis. Recent history of chemotherapy for lymphoma. EXAM: CT ANGIOGRAPHY CHEST CT ABDOMEN AND PELVIS WITH CONTRAST TECHNIQUE: Multidetector CT imaging of the chest was performed using the standard protocol during bolus administration of intravenous contrast. Multiplanar CT image reconstructions and MIPs were obtained to evaluate the vascular anatomy. Multidetector CT imaging of the abdomen and pelvis was performed using the standard protocol during bolus administration of intravenous contrast. CONTRAST:  57mL OMNIPAQUE IOHEXOL 350 MG/ML SOLN COMPARISON:  CT the chest, abdomen and pelvis 11/29/2009. FINDINGS: CTA CHEST FINDINGS Cardiovascular: Today's study is limited by patient respiratory motion. With these limitations in mind, there are no central, lobar or segmental sized filling defects within the pulmonary arterial tree to suggest clinically relevant pulmonary embolism. Smaller distal subsegmental sized pulmonary embolism cannot be entirely excluded. Heart size is normal. There is no significant pericardial fluid, thickening or pericardial calcification. There is aortic atherosclerosis, as well as atherosclerosis of the great vessels of the mediastinum and the coronary arteries, including calcified atherosclerotic plaque in the left main and left anterior descending coronary  arteries. Right internal jugular single-lumen porta cath with tip terminating in the right atrium. Mediastinum/Nodes: No pathologically enlarged mediastinal or hilar lymph nodes. Esophagus is unremarkable in appearance. Enlarged left  axillary lymph nodes measuring up to 1.8 cm in short axis. No right axillary lymphadenopathy. Lungs/Pleura: No large suspicious appearing pulmonary nodules or masses are noted. Smaller pulmonary nodules are poorly evaluated on today's motion limited examination. No confluent consolidative airspace disease. No pleural effusions. Dependent areas of atelectasis and/or scarring noted throughout the lungs bilaterally. Musculoskeletal: There are no aggressive appearing lytic or blastic lesions noted in the visualized portions of the skeleton. Review of the MIP images confirms the above findings. CT ABDOMEN and PELVIS FINDINGS Hepatobiliary: No suspicious cystic or solid hepatic lesions. No intra or extrahepatic biliary ductal dilatation. Status post cholecystectomy. Pancreas: No pancreatic mass. No pancreatic ductal dilatation. No pancreatic or peripancreatic fluid collections or inflammatory changes. Spleen: Spleen is enlarged measuring 11.0 x 5.6 x 16.4 cm (estimated splenic volume of 505 mL). Adrenals/Urinary Tract: Exophytic 1.5 cm low-attenuation lesion in the lateral aspect of the interpolar region of the right kidney is compatible with a simple cyst. Left kidney and bilateral adrenal glands are normal in appearance. No hydroureteronephrosis. Urinary bladder is normal in appearance. Stomach/Bowel: Normal appearance of the stomach. No pathologic dilatation of small bowel or colon. A few scattered colonic diverticulae are noted, without surrounding inflammatory changes to suggest an acute diverticulitis at this time. Normal appendix. Vascular/Lymphatic: Aortic atherosclerosis, without evidence of aneurysm or dissection in the abdominal or pelvic vasculature. No lymphadenopathy noted in the abdomen or pelvis. Reproductive: Status post hysterectomy. Ovaries are not confidently identified may be surgically absent or atrophic. Other: No significant volume of ascites.  No pneumoperitoneum. Musculoskeletal: There are no  aggressive appearing lytic or blastic lesions noted in the visualized portions of the skeleton. Review of the MIP images confirms the above findings. IMPRESSION: 1. Despite the limitations of today's examination, there is no evidence to suggest clinically relevant central, lobar or segmental sized pulmonary embolism. 2. No acute findings are noted in the abdomen or pelvis to account for the patient's symptoms. 3. Multiple borderline enlarged and mildly enlarged left axillary lymph nodes. No lymphadenopathy noted elsewhere in the chest, abdomen or pelvis. 4. Splenomegaly. Electronically Signed   By: Vinnie Langton M.D.   On: 03/30/2020 05:41   CT ABDOMEN PELVIS W CONTRAST  Result Date: 03/30/2020 CLINICAL DATA:  79 year old female with history of severe chest pain and hemoptysis. Nausea and vomiting. Blood in emesis. Recent history of chemotherapy for lymphoma. EXAM: CT ANGIOGRAPHY CHEST CT ABDOMEN AND PELVIS WITH CONTRAST TECHNIQUE: Multidetector CT imaging of the chest was performed using the standard protocol during bolus administration of intravenous contrast. Multiplanar CT image reconstructions and MIPs were obtained to evaluate the vascular anatomy. Multidetector CT imaging of the abdomen and pelvis was performed using the standard protocol during bolus administration of intravenous contrast. CONTRAST:  41mL OMNIPAQUE IOHEXOL 350 MG/ML SOLN COMPARISON:  CT the chest, abdomen and pelvis 11/29/2009. FINDINGS: CTA CHEST FINDINGS Cardiovascular: Today's study is limited by patient respiratory motion. With these limitations in mind, there are no central, lobar or segmental sized filling defects within the pulmonary arterial tree to suggest clinically relevant pulmonary embolism. Smaller distal subsegmental sized pulmonary embolism cannot be entirely excluded. Heart size is normal. There is no significant pericardial fluid, thickening or pericardial calcification. There is aortic atherosclerosis, as well as  atherosclerosis of the great vessels of the mediastinum and the coronary arteries, including  calcified atherosclerotic plaque in the left main and left anterior descending coronary arteries. Right internal jugular single-lumen porta cath with tip terminating in the right atrium. Mediastinum/Nodes: No pathologically enlarged mediastinal or hilar lymph nodes. Esophagus is unremarkable in appearance. Enlarged left axillary lymph nodes measuring up to 1.8 cm in short axis. No right axillary lymphadenopathy. Lungs/Pleura: No large suspicious appearing pulmonary nodules or masses are noted. Smaller pulmonary nodules are poorly evaluated on today's motion limited examination. No confluent consolidative airspace disease. No pleural effusions. Dependent areas of atelectasis and/or scarring noted throughout the lungs bilaterally. Musculoskeletal: There are no aggressive appearing lytic or blastic lesions noted in the visualized portions of the skeleton. Review of the MIP images confirms the above findings. CT ABDOMEN and PELVIS FINDINGS Hepatobiliary: No suspicious cystic or solid hepatic lesions. No intra or extrahepatic biliary ductal dilatation. Status post cholecystectomy. Pancreas: No pancreatic mass. No pancreatic ductal dilatation. No pancreatic or peripancreatic fluid collections or inflammatory changes. Spleen: Spleen is enlarged measuring 11.0 x 5.6 x 16.4 cm (estimated splenic volume of 505 mL). Adrenals/Urinary Tract: Exophytic 1.5 cm low-attenuation lesion in the lateral aspect of the interpolar region of the right kidney is compatible with a simple cyst. Left kidney and bilateral adrenal glands are normal in appearance. No hydroureteronephrosis. Urinary bladder is normal in appearance. Stomach/Bowel: Normal appearance of the stomach. No pathologic dilatation of small bowel or colon. A few scattered colonic diverticulae are noted, without surrounding inflammatory changes to suggest an acute diverticulitis at this  time. Normal appendix. Vascular/Lymphatic: Aortic atherosclerosis, without evidence of aneurysm or dissection in the abdominal or pelvic vasculature. No lymphadenopathy noted in the abdomen or pelvis. Reproductive: Status post hysterectomy. Ovaries are not confidently identified may be surgically absent or atrophic. Other: No significant volume of ascites.  No pneumoperitoneum. Musculoskeletal: There are no aggressive appearing lytic or blastic lesions noted in the visualized portions of the skeleton. Review of the MIP images confirms the above findings. IMPRESSION: 1. Despite the limitations of today's examination, there is no evidence to suggest clinically relevant central, lobar or segmental sized pulmonary embolism. 2. No acute findings are noted in the abdomen or pelvis to account for the patient's symptoms. 3. Multiple borderline enlarged and mildly enlarged left axillary lymph nodes. No lymphadenopathy noted elsewhere in the chest, abdomen or pelvis. 4. Splenomegaly. Electronically Signed   By: Vinnie Langton M.D.   On: 03/30/2020 05:41   DG Chest Port 1 View  Result Date: 03/30/2020 CLINICAL DATA:  79 year old female with chest pain, recently began chemotherapy for lymphoma. EXAM: PORTABLE CHEST 1 VIEW COMPARISON:  Chest radiographs 08/05/2018 and earlier. FINDINGS: Portable AP semi upright view at 0331 hours. Right chest power port in place. Lower lung volumes. Mediastinal contours remain normal. Visualized tracheal air column is within normal limits. Allowing for portable technique the lungs are clear. Paucity of bowel gas in the upper abdomen. Right upper quadrant cholecystectomy clips. No acute osseous abnormality identified. IMPRESSION: No acute cardiopulmonary abnormality. Electronically Signed   By: Genevie Ann M.D.   On: 03/30/2020 03:43   IR IMAGING GUIDED PORT INSERTION  Result Date: 03/13/2020 CLINICAL DATA:  Lymphoma, access chemotherapy EXAM: RIGHT INTERNAL JUGULAR SINGLE LUMEN POWER PORT  CATHETER INSERTION Date:  03/13/2020 03/13/2020 1:09 pm Radiologist:  Jerilynn Mages. Daryll Brod, MD Guidance:  Ultrasound and fluoroscopic MEDICATIONS: Ancef 2 g; The antibiotic was administered within an appropriate time interval prior to skin puncture. ANESTHESIA/SEDATION: Versed 2.0 mg IV; Fentanyl 100 mcg IV; Moderate Sedation Time:  23 minutes  The patient was continuously monitored during the procedure by the interventional radiology nurse under my direct supervision. FLUOROSCOPY TIME:  0 minutes, 30 seconds (4 mGy) COMPLICATIONS: None immediate. CONTRAST:  None. PROCEDURE: Informed consent was obtained from the patient following explanation of the procedure, risks, benefits and alternatives. The patient understands, agrees and consents for the procedure. All questions were addressed. A time out was performed. Maximal barrier sterile technique utilized including caps, mask, sterile gowns, sterile gloves, large sterile drape, hand hygiene, and 2% chlorhexidine scrub. Under sterile conditions and local anesthesia, right internal jugular micropuncture venous access was performed. Access was performed with ultrasound. Images were obtained for documentation of the patent right internal jugular vein. A guide wire was inserted followed by a transitional dilator. This allowed insertion of a guide wire and catheter into the IVC. Measurements were obtained from the SVC / RA junction back to the right IJ venotomy site. In the right infraclavicular chest, a subcutaneous pocket was created over the second anterior rib. This was done under sterile conditions and local anesthesia. 1% lidocaine with epinephrine was utilized for this. A 2.5 cm incision was made in the skin. Blunt dissection was performed to create a subcutaneous pocket over the right pectoralis major muscle. The pocket was flushed with saline vigorously. There was adequate hemostasis. The port catheter was assembled and checked for leakage. The port catheter was secured in  the pocket with two retention sutures. The tubing was tunneled subcutaneously to the right venotomy site and inserted into the SVC/RA junction through a valved peel-away sheath. Position was confirmed with fluoroscopy. Images were obtained for documentation. The patient tolerated the procedure well. No immediate complications. Incisions were closed in a two layer fashion with 4 - 0 Vicryl suture. Dermabond was applied to the skin. The port catheter was accessed, blood was aspirated followed by saline and heparin flushes. Needle was removed. A dry sterile dressing was applied. IMPRESSION: Ultrasound and fluoroscopically guided right internal jugular single lumen power port catheter insertion. Tip in the SVC/RA junction. Catheter ready for use. Electronically Signed   By: Jerilynn Mages.  Shick M.D.   On: 03/13/2020 13:15    ASSESSMENT & PLAN MADHAVI HAMBLEN 79 y.o. 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   Is also for EGD where not clear as a source of bleeding, though some esophagitis was noted.  Her hemoglobin has rebounded robustly from her hospitalization increased to 11.2 today with normal platelet count and near normal white blood cell count.  She had a nadir hemoglobin of 8.5 and platelet count of 104 on 03/31/2020.  Fortunately appears that there is no longer any active bleeding as she is not tachycardic or hypotensive at this time.  I do not think this would need to alter our treatment plan, though I will continue close monitoring of her hemoglobin levels to assure that there is no recurrence of this possible GI bleed.  For her current diagonsis I recommend a rituximab based regimen, with a preference for Bendamustine/Rituximab. This regimen would consist of bendamustine 90 mg/m2 IV once per day on days 1 & 2 and rituximab 375 mg/m2 IV once on day 1.  The cycles are 28 days and the plan would be for 8 cycles. If this was not tolerated,  could consider chlorambucil + rituximab vs. rituximab monotherapy.   GELF Criteria: 1 (splenomegaly). Indication for treatment  # Low Grade Marginal Zone Lymphoma, Stage III --proceed  with bendamustine + rituximab (regimen noted above). If patient is unable to tolerate this treatment can consider rituximab monotherapy instead. Plan to started therapy on 03/28/2020 --patient completed excisional lymph node biopsy to confirm the diagnosis. Pathological exam of the lymph node confirms marginal zone lymphoma.  --PET CT scan confirms stage III disease. Patient meets GELF criteria for treatment (splenomegaly). She fortunately does not have any B symptoms and her counts are stable at this time, with some mild thrombocytopenia.  --patient completed port placement and chemotherapy education. --RTC in 2 week for interval visit with 4 week treatment and repeat clinic visit.    #Concern for GI Bleed #Hematemesis --patient admitted on 03/30/2020 (day after chemotherapy) with hematemesis --no overt GI bleeding noted on Upper GI evaluation --Hgb rebounding, patient back to baseline --recommend weekly labs between now and next treatment visit. Continue to monitor Hgb.   #Symptom Management --prescribed zofran 8mg  PO q8H PRN and compazine 10mg  PO q6H PRN -- prescribed EMLA cream for patient's port site --prescribed allopurinol 300 mg PO daily while on this regimen as a precaution for TLS --for abdominal pain/headaches, can take tylenol 650mg -1000mg  PO q8H PRN.   No orders of the defined types were placed in this encounter.   All questions were answered. The patient knows to call the clinic with any problems, questions or concerns.  A total of more than 30 minutes were spent on this encounter and over half of that time was spent on counseling and coordination of care as outlined above.   Ledell Peoples, MD Department of Hematology/Oncology [place] [facility] Phone:  [phone] Pager: [phone] Email: [email or web address]  04/08/2020 1:44 PM   Literature Support:  Artist Pais der Angeline Slim BS, Lyna Poser, Hertzberg M, Kwan YL, Jiminez D, Craig M, [place], [place], [place], [employer] DM, Munteanu M, Aram Beecham JM. 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 Blood. 2014 May 8;123(19):2944-52.  --The overall response rates for BR and R-CHOP/R-CVP were 97% and 91%, respectively (P = .[id number]). These data indicate BR is noninferior to standard therapy with regard to clinical response with an acceptable safety profile.

## 2020-04-11 ENCOUNTER — Telehealth: Payer: Self-pay | Admitting: Hematology and Oncology

## 2020-04-11 ENCOUNTER — Encounter: Payer: Self-pay | Admitting: Family Medicine

## 2020-04-11 NOTE — Telephone Encounter (Signed)
Scheduled appt per 7/25 sch msg - unable to reach pt .left message with appt date and time

## 2020-04-12 ENCOUNTER — Other Ambulatory Visit: Payer: Self-pay

## 2020-04-12 ENCOUNTER — Inpatient Hospital Stay: Payer: Medicare Other

## 2020-04-12 ENCOUNTER — Other Ambulatory Visit: Payer: Self-pay | Admitting: *Deleted

## 2020-04-12 DIAGNOSIS — R61 Generalized hyperhidrosis: Secondary | ICD-10-CM | POA: Diagnosis not present

## 2020-04-12 DIAGNOSIS — C8514 Unspecified B-cell lymphoma, lymph nodes of axilla and upper limb: Secondary | ICD-10-CM | POA: Diagnosis not present

## 2020-04-12 DIAGNOSIS — Z95828 Presence of other vascular implants and grafts: Secondary | ICD-10-CM

## 2020-04-12 DIAGNOSIS — K920 Hematemesis: Secondary | ICD-10-CM

## 2020-04-12 DIAGNOSIS — C8580 Other specified types of non-Hodgkin lymphoma, unspecified site: Secondary | ICD-10-CM

## 2020-04-12 LAB — IRON AND TIBC
Iron: 13 ug/dL — ABNORMAL LOW (ref 41–142)
Saturation Ratios: 4 % — ABNORMAL LOW (ref 21–57)
TIBC: 306 ug/dL (ref 236–444)
UIBC: 293 ug/dL (ref 120–384)

## 2020-04-12 LAB — CMP (CANCER CENTER ONLY)
ALT: 17 U/L (ref 0–44)
AST: 19 U/L (ref 15–41)
Albumin: 3.6 g/dL (ref 3.5–5.0)
Alkaline Phosphatase: 56 U/L (ref 38–126)
Anion gap: 10 (ref 5–15)
BUN: 15 mg/dL (ref 8–23)
CO2: 20 mmol/L — ABNORMAL LOW (ref 22–32)
Calcium: 9.7 mg/dL (ref 8.9–10.3)
Chloride: 105 mmol/L (ref 98–111)
Creatinine: 1.46 mg/dL — ABNORMAL HIGH (ref 0.44–1.00)
GFR, Est AFR Am: 40 mL/min — ABNORMAL LOW (ref >60)
GFR, Estimated: 34 mL/min — ABNORMAL LOW (ref >60)
Glucose, Bld: 123 mg/dL — ABNORMAL HIGH (ref 70–99)
Potassium: 3.2 mmol/L — ABNORMAL LOW (ref 3.5–5.1)
Sodium: 135 mmol/L (ref 135–145)
Total Bilirubin: 0.5 mg/dL (ref 0.3–1.2)
Total Protein: 6.4 g/dL — ABNORMAL LOW (ref 6.5–8.1)

## 2020-04-12 LAB — CBC WITH DIFFERENTIAL (CANCER CENTER ONLY)
Abs Immature Granulocytes: 0.05 10*3/uL (ref 0.00–0.07)
Basophils Absolute: 0.1 10*3/uL (ref 0.0–0.1)
Basophils Relative: 1 %
Eosinophils Absolute: 0.3 10*3/uL (ref 0.0–0.5)
Eosinophils Relative: 4 %
HCT: 28.5 % — ABNORMAL LOW (ref 36.0–46.0)
Hemoglobin: 9.9 g/dL — ABNORMAL LOW (ref 12.0–15.0)
Immature Granulocytes: 1 %
Lymphocytes Relative: 5 %
Lymphs Abs: 0.3 10*3/uL — ABNORMAL LOW (ref 0.7–4.0)
MCH: 28.2 pg (ref 26.0–34.0)
MCHC: 34.7 g/dL (ref 30.0–36.0)
MCV: 81.2 fL (ref 80.0–100.0)
Monocytes Absolute: 0.7 10*3/uL (ref 0.1–1.0)
Monocytes Relative: 10 %
Neutro Abs: 5.7 10*3/uL (ref 1.7–7.7)
Neutrophils Relative %: 79 %
Platelet Count: 116 10*3/uL — ABNORMAL LOW (ref 150–400)
RBC: 3.51 MIL/uL — ABNORMAL LOW (ref 3.87–5.11)
RDW: 15.3 % (ref 11.5–15.5)
WBC Count: 7.1 10*3/uL (ref 4.0–10.5)
nRBC: 0 % (ref 0.0–0.2)

## 2020-04-12 LAB — RETIC PANEL
Immature Retic Fract: 4.4 % (ref 2.3–15.9)
RBC.: 3.56 MIL/uL — ABNORMAL LOW (ref 3.87–5.11)
Retic Count, Absolute: 88.3 10*3/uL (ref 19.0–186.0)
Retic Ct Pct: 2.5 % (ref 0.4–3.1)
Reticulocyte Hemoglobin: 32.8 pg (ref >27.9)

## 2020-04-12 LAB — LACTATE DEHYDROGENASE: LDH: 216 U/L — ABNORMAL HIGH (ref 98–192)

## 2020-04-12 LAB — FERRITIN: Ferritin: 159 ng/mL (ref 11–307)

## 2020-04-12 MED ORDER — SODIUM CHLORIDE 0.9% FLUSH
10.0000 mL | INTRAVENOUS | Status: DC | PRN
  Administered 2020-04-12: 10 mL
  Filled 2020-04-12: qty 10

## 2020-04-12 MED ORDER — LIDOCAINE-PRILOCAINE 2.5-2.5 % EX CREA: 1.0000 "application " | TOPICAL_CREAM | CUTANEOUS | 1 refills | Status: DC | PRN

## 2020-04-12 MED ORDER — HEPARIN SOD (PORK) LOCK FLUSH 100 UNIT/ML IV SOLN
500.0000 [IU] | Freq: Once | INTRAVENOUS | Status: AC | PRN
  Administered 2020-04-12: 500 [IU]
  Filled 2020-04-12: qty 5

## 2020-04-18 ENCOUNTER — Ambulatory Visit (INDEPENDENT_AMBULATORY_CARE_PROVIDER_SITE_OTHER): Payer: Medicare Other | Admitting: Nurse Practitioner

## 2020-04-18 ENCOUNTER — Encounter: Payer: Self-pay | Admitting: Nurse Practitioner

## 2020-04-18 ENCOUNTER — Other Ambulatory Visit: Payer: Self-pay

## 2020-04-18 ENCOUNTER — Telehealth: Payer: Self-pay | Admitting: *Deleted

## 2020-04-18 VITALS — BP 142/75 | HR 101 | Temp 96.5°F | Ht 62.0 in | Wt 133.4 lb

## 2020-04-18 DIAGNOSIS — R1310 Dysphagia, unspecified: Secondary | ICD-10-CM | POA: Diagnosis not present

## 2020-04-18 DIAGNOSIS — R197 Diarrhea, unspecified: Secondary | ICD-10-CM | POA: Diagnosis not present

## 2020-04-18 DIAGNOSIS — K2101 Gastro-esophageal reflux disease with esophagitis, with bleeding: Secondary | ICD-10-CM | POA: Diagnosis not present

## 2020-04-18 DIAGNOSIS — R1319 Other dysphagia: Secondary | ICD-10-CM

## 2020-04-18 HISTORY — DX: Dysphagia, unspecified: R13.10

## 2020-04-18 MED ORDER — SUCRALFATE 1 GM/10ML PO SUSP: 1.0000 g | Freq: Four times a day (QID) | ORAL | 1 refills | Status: DC | PRN

## 2020-04-18 NOTE — Progress Notes (Signed)
Referring Provider: Ma Hillock, DO Primary Care Physician:  Ma Hillock, DO Primary GI:  Dr. Gala Romney (in the absence of Dr. Oneida Alar); pending Dr. Abbey Chatters  Chief Complaint  Patient presents with  . Emesis    1st cancer tx 03/28/20 and 03/29/20.     HPI:   Elizabeth Mcintyre is a 79 y.o. female who presents for follow-up.  The patient was last seen in our office 07/07/2019 for diarrhea, incontinence, dysphagia.  Colonoscopy up-to-date next due in 2022.  HIDA scan on file 2018 with diminished gallbladder EF 60% for which she underwent laparoscopic cholecystectomy in 2018.  CT of the abdomen and pelvis 05/10/2019 with new hepatic steatosis compared to prior exam, no splenomegaly compared to prior exam measuring 16.2 cm, large stool burden.  Recommended MiraLAX.  At last visit doing okay, still with diarrhea every 2 to 3 days up to seven stools a day.  Noted fecal urgency.  Diarrhea since gallbladder removal in 2018.  No other overt GI complaints.  At the end of her visit she noted that she will occasionally eat something such as cottage cheese at bedtime gets her stomach it comes back up.  No overt dysphagia.  Recommended complete x-ray, start Questran once a day for 7 days, then twice a day for 7 days, then three times a day moving forward.  Follow-up in 2 months.  The patient was admitted to [facility] long hospital from 03/30/2020 through 04/01/2020 for GI bleed.  Recently diagnosed with B-cell lymphoma and chemotherapy started on 715 who presented with "spitting up blood" following initial round of chemotherapy.  Gastroenterology was consulted and underwent EGD on 03/30/2020 found to have moderate to severe GERD with chronic and erosive esophagitis without evidence of bleeding, noted Schatzki's ring, recommended continue Protonix 40 mg twice daily repeat EGD in 2 months.  Hemoglobin at discharge on 04/01/2020 was stable at 9.6.  Today she states she's doing ok overall. Denies any further nausea or  vomiting. No more coughing/spitting up blood. Tongue is sore, has a coating with frequent GERD symptoms; oncology didn't think she has thrush. Getting harder to take her pills, not sure if she needs another dilation. She has been on Protonix chronically for some time now. Also was started on Carafate which is new. Overall feels like GERD symptoms are improved, except tongue and throat. Tongue is sore, burns, hurts; throat as well. "Feels like concrete in my mouth when I wake up." Was having some abdominal pain last week, but seems to be calming down. Pain was mid- to lower abdomen. Has been having frequent stools, but better than it was. Was taking Questran which helped. Discovered drinking dark sodas was making this worse and has been trying to avoid these. Denies hematochezia, melena, chills, unintentional weight loss. Did have low grade temp of around 100 last weekend, did not notify oncology. Denies URI or flu-like symptoms. Denies loss of sense of taste or smell. The patient has received COVID-19 vaccination(s). Denies chest pain, dyspnea, dizziness, lightheadedness, syncope, near syncope. Denies any other upper or lower GI symptoms.  Past Medical History:  Diagnosis Date  . Asthma   . Bloating 04/26/2019  . Cancer ([place]) 2021   Lymphoma  . 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 DIABETES MELLITUS, TYPE II 11/09/2007   diet control  . Diverticulosis 03/2011  . GERD (gastroesophageal reflux disease)    none recently  . GI bleed   .  History of rheumatoid arthritis    during 30's, was treated.  . Hyperlipidemia   . Hypertension   . IBS (irritable bowel syndrome)   . Osteopenia 2017   Last  bone density 05/04/2017: -2.4  . PONV (postoperative nausea and vomiting)   . Stress incontinence   . Stroke [facility])    mini stroke - found on a CT scan    Past Surgical History:  Procedure Laterality Date  . ABDOMINAL HYSTERECTOMY    . BIOPSY  03/30/2020    Procedure: BIOPSY;  Surgeon: Ronald Lobo, MD;  Location: [facility];  Service: Endoscopy;;  . CARDIAC CATHETERIZATION     X 2, last one in 1998  . CHOLECYSTECTOMY N/A 04/13/2017   Procedure: LAPAROSCOPIC CHOLECYSTECTOMY;  Surgeon: Aviva Signs, MD;  Location: [facility];  Service: General;  Laterality: N/A;  . COLONOSCOPY    . COLONOSCOPY  May 2012   Dr. Olevia Perches: mild diverticulosis, otherwise normal.   . ESOPHAGOGASTRODUODENOSCOPY N/A 01/28/2015   Dr. Gala Romney: reflux esophagitis, Schatzki's ring not manipulated due to recent bleeding  . ESOPHAGOGASTRODUODENOSCOPY N/A 03/30/2015   Dr. Gala Romney: Schatzki's ring s/p Venia Minks dilation, previously noted esophageal ulcer completely healed  . ESOPHAGOGASTRODUODENOSCOPY N/A 03/30/2020   Procedure: ESOPHAGOGASTRODUODENOSCOPY (EGD);  Surgeon: Ronald Lobo, MD;  Location: [facility];  Service: Endoscopy;  Laterality: N/A;  . IR IMAGING GUIDED PORT INSERTION  03/13/2020  . LYMPH NODE BIOPSY Left 03/20/2020   Procedure: LEFT POSTERIOR CERVICAL LYMPH NODE BIOPSY;  Surgeon: Georganna Skeans, MD;  Location: [place];  Service: General;  Laterality: Left;  Marland Kitchen MALONEY DILATION N/A 03/30/2015   Procedure: Venia Minks DILATION;  Surgeon: Daneil Dolin, MD;  Location: [facility];  Service: Endoscopy;  Laterality: N/A;    Current Outpatient Medications  Medication Sig Dispense Refill  . acetaminophen (TYLENOL) 325 MG tablet Take 650 mg by mouth every 6 (six) hours as needed for moderate pain.     Marland Kitchen albuterol (VENTOLIN HFA) 108 (90 Base) MCG/ACT inhaler Inhale 2 puffs into the lungs every 6 (six) hours as needed for wheezing. 8.5 g 2  . allopurinol (ZYLOPRIM) 300 MG tablet Take 1 tablet (300 mg total) by mouth daily. 30 tablet 3  . amLODipine (NORVASC) 10 MG tablet Take 1 tablet (10 mg total) by mouth daily. 90 tablet 1  . Cholecalciferol (VITAMIN D3) 25 MCG (1000 UT) CAPS Take 1,000 Units by mouth daily.     Marland Kitchen dicyclomine (BENTYL) 10 MG capsule Take 1 capsule (10 mg  total) by mouth 4 (four) times daily -  before meals and at bedtime. Needs appt for further refills. (Patient taking differently: Take 10 mg by mouth 3 (three) times daily as needed for spasms. ) 360 capsule 0  . levothyroxine (SYNTHROID) 75 MCG tablet Take 1 tablet (75 mcg total) by mouth daily. 90 tablet 3  . lidocaine-prilocaine (EMLA) cream Apply 1 application topically as needed. 30 g 1  . ondansetron (ZOFRAN) 8 MG tablet Take 1 tablet (8 mg total) by mouth every 8 (eight) hours as needed for nausea or vomiting. 30 tablet 1  . pantoprazole (PROTONIX) 40 MG tablet Take 1 tablet (40 mg total) by mouth 2 (two) times daily. After 8 weeks take only once daily. 60 tablet 1  . prochlorperazine (COMPAZINE) 10 MG tablet Take 1 tablet (10 mg total) by mouth every 6 (six) hours as needed for nausea or vomiting. 30 tablet 1  . rOPINIRole (REQUIP) 0.25 MG tablet Take 1 tablet (0.25 mg total) by mouth at  bedtime. 90 tablet 1  . sucralfate (CARAFATE) 1 g tablet Take 1 tablet (1 g total) by mouth 4 (four) times daily -  with meals and at bedtime. 90 tablet 3  . vitamin B-12 (CYANOCOBALAMIN) 1000 MCG tablet Take 1,000 mcg by mouth daily.     No current facility-administered medications for this visit.    Allergies as of 04/18/2020 - Review Complete 04/18/2020  Allergen Reaction Noted  . Iohexol Swelling 03/30/2020  . Codeine phosphate Nausea And Vomiting and Rash     Family History  Problem Relation Age of Onset  . Heart disease Mother   . Osteoarthritis Mother   . Sudden death Father   . Single kidney Father   . Other Father        h/o severe MVA injuries  . Hyperlipidemia Sister   . Other Daughter        Myalgias  . Fibromyalgia Daughter   . Allergies Daughter   . Heart disease Maternal Grandfather   . Sudden death Paternal Grandmother   . Diabetes Paternal Grandfather   . Heart disease Daughter   . Other Daughter        palpitations  . Pulmonary fibrosis Maternal Aunt   . Cancer  Paternal Uncle   . Pulmonary fibrosis Maternal Aunt   . Colon cancer Neg Hx     Social History   Socioeconomic History  . Marital status: Widowed    Spouse name: Not on file  . Number of children: 2  . Years of education: Not on file  . Highest education level: Not on file  Occupational History  . Occupation: retired  Tobacco Use  . Smoking status: Never Smoker  . Smokeless tobacco: Never Used  Vaping Use  . Vaping Use: Never used  Substance and Sexual Activity  . Alcohol use: No  . Drug use: No  . Sexual activity: Never  Other Topics Concern  . Not on file  Social History Narrative   Ms. Busser is widowed. Her young grandson lives with her, for whom she shares custody with her daughter, the son's aunt.     Social Determinants of Health   Financial Resource Strain:   . Difficulty of Paying Living Expenses:   Food Insecurity:   . Worried About Charity fundraiser in the Last Year:   . Arboriculturist in the Last Year:   Transportation Needs:   . Film/video editor (Medical):   Marland Kitchen Lack of Transportation (Non-Medical):   Physical Activity:   . Days of Exercise per Week:   . Minutes of Exercise per Session:   Stress:   . Feeling of Stress :   Social Connections:   . Frequency of Communication with Friends and Family:   . Frequency of Social Gatherings with Friends and Family:   . Attends Religious Services:   . Active Member of Clubs or Organizations:   . Attends Archivist Meetings:   Marland Kitchen Marital Status:     Subjective: Review of Systems  Constitutional: Positive for weight loss. Negative for chills, fever and malaise/fatigue.       Poor appetite  HENT: Negative for congestion and sore throat.   Respiratory: Negative for cough and shortness of breath.   Cardiovascular: Negative for chest pain and palpitations.  Gastrointestinal: Positive for heartburn. Negative for abdominal pain, blood in stool, diarrhea, melena, nausea and vomiting.    Musculoskeletal: Negative for joint pain and myalgias.  Skin: Negative for rash.  Neurological:  Negative for dizziness and weakness.  Endo/Heme/Allergies: Does not bruise/bleed easily.  Psychiatric/Behavioral: Negative for depression. The patient is not nervous/anxious.   All other systems reviewed and are negative.    Objective: BP (!) 142/75   Pulse (!) 101   Temp (!) 96.5 F (35.8 C) (Temporal)   Ht 5\' 2"  (1.575 m)   Wt 133 lb 6.4 oz (60.5 kg)   BMI 24.40 kg/m  Physical Exam Vitals and nursing note reviewed.  Constitutional:      General: She is not in acute distress.    Appearance: Normal appearance. She is well-developed and normal weight. She is not ill-appearing, toxic-appearing or diaphoretic.  HENT:     Head: Normocephalic and atraumatic.     Nose: No congestion or rhinorrhea.  Eyes:     General: No scleral icterus. Cardiovascular:     Rate and Rhythm: Normal rate and regular rhythm.     Heart sounds: Normal heart sounds.  Pulmonary:     Effort: Pulmonary effort is normal. No respiratory distress.     Breath sounds: Normal breath sounds.  Abdominal:     General: Bowel sounds are normal.     Palpations: Abdomen is soft. There is no hepatomegaly, splenomegaly or mass.     Tenderness: There is no abdominal tenderness. There is no guarding or rebound.     Hernia: No hernia is present.  Skin:    General: Skin is warm and dry.     Coloration: Skin is not jaundiced.     Findings: No rash.  Neurological:     General: No focal deficit present.     Mental Status: She is alert and oriented to person, place, and time.  Psychiatric:        Attention and Perception: Attention normal.        Mood and Affect: Mood normal.        Speech: Speech normal.        Behavior: Behavior normal.        Thought Content: Thought content normal.        Cognition and Memory: Cognition and memory normal.       04/18/2020 3:11 PM   Disclaimer: This note was dictated with voice  recognition software. Similar sounding words can inadvertently be transcribed and may not be corrected upon review.

## 2020-04-18 NOTE — Assessment & Plan Note (Signed)
Notable GERD symptoms prior to starting chemotherapy, has been on Protonix for a number of years.  Her symptoms seem to be getting progressively worse.  She has had a white coating on her tongue which her oncologist did not feel was thrush.  I examined her oral cavity today and no obvious coating.  Posterior oropharynx does appear a bit erythematous.  Likely reflux type symptoms.  Recent EGD as per HPI indicating erosive esophagitis.  I do not think that Protonix is doing well for her at this point.  I will have her stop Protonix and start her on Dexilant with samples for 1 or 2 weeks and request a progress report in 1 to 2 weeks.  Continue Carafate, I will send an updated prescription for the liquid medication rather than the tablet which is not helping as much/takes a lot of time to crush up the pills.  Follow-up in 2 months.  Call for any worsening or severe symptoms.

## 2020-04-18 NOTE — Patient Instructions (Signed)
Your health issues we discussed today were:   GERD (reflux/heartburn) with swallowing difficulties with pills: 1. I am giving you samples of Dexilant 60 mg.  Take this once a day, first thing in the morning 2. I am giving you samples last 1 to 2 weeks.  Call us in 1 to 2 weeks and let us know if it is helping your reflux/GERD symptoms better than Protonix 3. When you start taking Dexilant, start taking Protonix 4. I have scheduled a swallowing study to check for narrowings that are making it difficult for you to swallow pills 5. I have printed a prescription for Carafate liquid.  If your insurance would not cover this very well, I have provided a GoodRx card to help with the cost.  You can go which with ever option is cheapest for you 6. Call us if you have any worsening or severe symptoms  Overall I recommend:  1. Continue your other current medications 2. Return for follow-up in 2 months 3. Call us if you have any questions or concerns   ---------------------------------------------------------------  I am glad you have gotten your COVID-19 vaccination!  Even though you are fully vaccinated you should continue to follow CDC and state/local guidelines.  ---------------------------------------------------------------   At [facility] we value your feedback. You may receive a survey about your visit today. Please share your experience as we strive to create trusting relationships with our patients to provide genuine, compassionate, quality care.  We appreciate your understanding and patience as we review any laboratory studies, imaging, and other diagnostic tests that are ordered as we care for you. Our office policy is 5 business days for review of these results, and any emergent or urgent results are addressed in a timely manner for your best interest. If you do not hear from our office in 1 week, please contact us.   We also encourage the use of MyChart, which contains  your medical information for your review as well. If you are not enrolled in this feature, an access code is on this after visit summary for your convenience. Thank you for allowing [place] to be involved in your care.  It was great to see you today!  I hope you have a great Summer and [place]!!!!!

## 2020-04-18 NOTE — Telephone Encounter (Signed)
BPE scheduled for 8/9 at 9:30am, arrival 9:15am, npo 3 hrs prior  Called pt and LMOVM with appt details on named VM

## 2020-04-18 NOTE — Assessment & Plan Note (Signed)
History of dysphagia with previous esophageal dilations.  She notes she has had possibly some pill dysphagia recently.  EGD completed during her hospitalization within the past month shows nonobstructive Schatzki's ring.  At this point rather than repeating her EGD given her extensive erosive esophagitis which may preclude dilation, I will check a barium pill esophagram to see if her Schatzki's ring is obstructive to the barium tablet.  Further recommendations to follow her BPE.  Follow-up in 2 months.  Call for any worsening or severe symptoms.  ER precautions given.

## 2020-04-18 NOTE — Assessment & Plan Note (Signed)
Symptoms have significantly improved per the patient and her daughter.  Doing well on current therapy.  Recommend she continue her current medications and follow-up in 2 months.

## 2020-04-19 ENCOUNTER — Inpatient Hospital Stay (HOSPITAL_BASED_OUTPATIENT_CLINIC_OR_DEPARTMENT_OTHER): Payer: Medicare Other | Admitting: Hematology and Oncology

## 2020-04-19 ENCOUNTER — Inpatient Hospital Stay: Payer: Medicare Other | Attending: Hematology and Oncology

## 2020-04-19 ENCOUNTER — Other Ambulatory Visit: Payer: Self-pay | Admitting: Hematology and Oncology

## 2020-04-19 ENCOUNTER — Encounter: Payer: Self-pay | Admitting: Hematology and Oncology

## 2020-04-19 ENCOUNTER — Inpatient Hospital Stay: Payer: Medicare Other

## 2020-04-19 ENCOUNTER — Other Ambulatory Visit: Payer: Self-pay

## 2020-04-19 VITALS — BP 138/63 | HR 103 | Temp 97.7°F | Resp 18 | Ht 62.0 in | Wt 132.4 lb

## 2020-04-19 DIAGNOSIS — Z5112 Encounter for antineoplastic immunotherapy: Secondary | ICD-10-CM | POA: Insufficient documentation

## 2020-04-19 DIAGNOSIS — D500 Iron deficiency anemia secondary to blood loss (chronic): Secondary | ICD-10-CM | POA: Diagnosis not present

## 2020-04-19 DIAGNOSIS — K920 Hematemesis: Secondary | ICD-10-CM

## 2020-04-19 DIAGNOSIS — C8580 Other specified types of non-Hodgkin lymphoma, unspecified site: Secondary | ICD-10-CM

## 2020-04-19 DIAGNOSIS — Z95828 Presence of other vascular implants and grafts: Secondary | ICD-10-CM

## 2020-04-19 DIAGNOSIS — Z5111 Encounter for antineoplastic chemotherapy: Secondary | ICD-10-CM | POA: Diagnosis not present

## 2020-04-19 DIAGNOSIS — C8514 Unspecified B-cell lymphoma, lymph nodes of axilla and upper limb: Secondary | ICD-10-CM | POA: Diagnosis not present

## 2020-04-19 LAB — CBC WITH DIFFERENTIAL (CANCER CENTER ONLY)
Abs Immature Granulocytes: 0.05 10*3/uL (ref 0.00–0.07)
Basophils Absolute: 0.1 10*3/uL (ref 0.0–0.1)
Basophils Relative: 2 %
Eosinophils Absolute: 0.3 10*3/uL (ref 0.0–0.5)
Eosinophils Relative: 8 %
HCT: 28.3 % — ABNORMAL LOW (ref 36.0–46.0)
Hemoglobin: 9.7 g/dL — ABNORMAL LOW (ref 12.0–15.0)
Immature Granulocytes: 2 %
Lymphocytes Relative: 11 %
Lymphs Abs: 0.4 10*3/uL — ABNORMAL LOW (ref 0.7–4.0)
MCH: 27.6 pg (ref 26.0–34.0)
MCHC: 34.3 g/dL (ref 30.0–36.0)
MCV: 80.4 fL (ref 80.0–100.0)
Monocytes Absolute: 0.6 10*3/uL (ref 0.1–1.0)
Monocytes Relative: 18 %
Neutro Abs: 2 10*3/uL (ref 1.7–7.7)
Neutrophils Relative %: 59 %
Platelet Count: 93 10*3/uL — ABNORMAL LOW (ref 150–400)
RBC: 3.52 MIL/uL — ABNORMAL LOW (ref 3.87–5.11)
RDW: 15.4 % (ref 11.5–15.5)
WBC Count: 3.4 10*3/uL — ABNORMAL LOW (ref 4.0–10.5)
nRBC: 0 % (ref 0.0–0.2)

## 2020-04-19 LAB — CMP (CANCER CENTER ONLY)
ALT: 7 U/L (ref 0–44)
AST: 12 U/L — ABNORMAL LOW (ref 15–41)
Albumin: 3.4 g/dL — ABNORMAL LOW (ref 3.5–5.0)
Alkaline Phosphatase: 62 U/L (ref 38–126)
Anion gap: 8 (ref 5–15)
BUN: 8 mg/dL (ref 8–23)
CO2: 21 mmol/L — ABNORMAL LOW (ref 22–32)
Calcium: 9.6 mg/dL (ref 8.9–10.3)
Chloride: 107 mmol/L (ref 98–111)
Creatinine: 1.31 mg/dL — ABNORMAL HIGH (ref 0.44–1.00)
GFR, Est AFR Am: 45 mL/min — ABNORMAL LOW (ref >60)
GFR, Estimated: 39 mL/min — ABNORMAL LOW (ref >60)
Glucose, Bld: 142 mg/dL — ABNORMAL HIGH (ref 70–99)
Potassium: 3.4 mmol/L — ABNORMAL LOW (ref 3.5–5.1)
Sodium: 136 mmol/L (ref 135–145)
Total Bilirubin: 0.4 mg/dL (ref 0.3–1.2)
Total Protein: 6.9 g/dL (ref 6.5–8.1)

## 2020-04-19 LAB — LACTATE DEHYDROGENASE: LDH: 176 U/L (ref 98–192)

## 2020-04-19 MED ORDER — SODIUM CHLORIDE 0.9% FLUSH
10.0000 mL | INTRAVENOUS | Status: DC | PRN
  Administered 2020-04-19: 10 mL
  Filled 2020-04-19: qty 10

## 2020-04-19 MED ORDER — HEPARIN SOD (PORK) LOCK FLUSH 100 UNIT/ML IV SOLN
500.0000 [IU] | Freq: Once | INTRAVENOUS | Status: AC | PRN
  Administered 2020-04-19: 500 [IU]
  Filled 2020-04-19: qty 5

## 2020-04-19 NOTE — Progress Notes (Signed)
[place] [phone]   [phone]  PROGRESS NOTE  Patient Care Team: Ma Hillock, DO as PCP - General (Family Medicine) Danie Binder, MD (Inactive) as Consulting Physician (Gastroenterology) Gala Romney Cristopher Estimable, MD as Consulting Physician (Gastroenterology) Minus Breeding, MD as Consulting Physician (Cardiology) Annitta Needs, NP (Gastroenterology) Carlis Stable, NP as Nurse Practitioner (Gastroenterology)  Hematological/Oncological History  # Low Grade Marginal Zone Lymphoma, Stage III 1) 06/06/2019: patient underwent a diagnostic mammogram of the left breast. Calcifications noted in the left breast, recommended 6 month f/u imaging. 2) 02/02/2020: bilateral diagnostic mammogram showedabnormal enlarged lymph nodes with cortical thickening. The largest of these measures 2.2 x 1.7 x 2.0 cm. 3) 02/02/2020: US guided biopsy of left axillary lymph nodes performed. 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 4) 02/16/2020: establish care with Dr. Lorenso Courier 5) 02/29/2020: PET CT scan performed, showed hypermetabolic lymph nodes identified in the posterior left neck, both axillary/subpectoral regions, mediastinum, hila, right external iliac chain, and right groin. Massive splenomegaly of 17.8 cm noted as well.  6) 03/20/2020: excisional biopsy of left posterior cervical lymph node. Biopsy confirmed most likely low grade marginal zone lymphoma.   7) 7/14-7/15/2021: Cycle 1 Day 1 of Rituximab/Bendamycin 8) 03/30/2020-04/01/2020: admitted inpatient for hematemesis. Noted to have marked esophagitis on EGD.   Interval History:  NATINA WIGINTON 79 y.o. female with medical history significant for low grade marginal zone lymphoma stage IIIA who presents for an urgent follow up  visit. The patient's last visit was on 04/04/2020 at which time she was seen in hospital follow up. In the interim she has had worsening fatigue and was brought back from her labs only visits today for evaluation.   On exam today Mrs. Shankman notes she has been having issues with marked weakness and fatigue since her last visit. She notes that she has "nothing left" and that she "done fell out". She reports that she has dizziness and she feels that she often has to sit down while walking and take a break for fear of overexertion. She does endorse having a temperature up to 100.5 F and notes that this occurred over the weekend. This was associated with sweating at that time but she was not having any issues with nausea, vomiting, or diarrhea. She did have some loose stools over the weekend but does not report any frank diarrhea at this time. She is also following with a gastroenterologist due to concern for the esophagitis and was recently seen up in [place].  On further discussion she notes he is not seeing any overt signs of bleeding, bruising, or dark stools since our last discussion. She has been having trouble with sleep due to worsening restless leg syndrome. A full 10 point ROS is listed below.  MEDICAL HISTORY:  Past Medical History:  Diagnosis Date  . Asthma   . Bloating 04/26/2019  . Cancer ([place]) 2021   Lymphoma  . 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 DIABETES MELLITUS, TYPE II 11/09/2007   diet control  . Diverticulosis 03/2011  . GERD (gastroesophageal reflux disease)    none recently  . GI bleed   . History of rheumatoid arthritis    during 30's, was treated.  . Hyperlipidemia   . Hypertension   . IBS (irritable bowel  syndrome)   . Osteopenia 2017   Last  bone density 05/04/2017: -2.4  . PONV (postoperative nausea and vomiting)   . Stress incontinence   . Stroke [facility])    mini stroke - found on a CT scan    SURGICAL  HISTORY: Past Surgical History:  Procedure Laterality Date  . ABDOMINAL HYSTERECTOMY    . BIOPSY  03/30/2020   Procedure: BIOPSY;  Surgeon: Ronald Lobo, MD;  Location: [facility] ENDOSCOPY;  Service: Endoscopy;;  . CARDIAC CATHETERIZATION     X 2, last one in 1998  . CHOLECYSTECTOMY N/A 04/13/2017   Procedure: LAPAROSCOPIC CHOLECYSTECTOMY;  Surgeon: Aviva Signs, MD;  Location: [facility] ORS;  Service: General;  Laterality: N/A;  . COLONOSCOPY    . COLONOSCOPY  May 2012   Dr. Olevia Perches: mild diverticulosis, otherwise normal.   . ESOPHAGOGASTRODUODENOSCOPY N/A 01/28/2015   Dr. Gala Romney: reflux esophagitis, Schatzki's ring not manipulated due to recent bleeding  . ESOPHAGOGASTRODUODENOSCOPY N/A 03/30/2015   Dr. Gala Romney: Schatzki's ring s/p Venia Minks dilation, previously noted esophageal ulcer completely healed  . ESOPHAGOGASTRODUODENOSCOPY N/A 03/30/2020   Procedure: ESOPHAGOGASTRODUODENOSCOPY (EGD);  Surgeon: Ronald Lobo, MD;  Location: [facility];  Service: Endoscopy;  Laterality: N/A;  . IR IMAGING GUIDED PORT INSERTION  03/13/2020  . LYMPH NODE BIOPSY Left 03/20/2020   Procedure: LEFT POSTERIOR CERVICAL LYMPH NODE BIOPSY;  Surgeon: Georganna Skeans, MD;  Location: [facility];  Service: General;  Laterality: Left;  Marland Kitchen MALONEY DILATION N/A 03/30/2015   Procedure: Venia Minks DILATION;  Surgeon: Daneil Dolin, MD;  Location: [facility];  Service: Endoscopy;  Laterality: N/A;    SOCIAL HISTORY: Social History   Socioeconomic History  . Marital status: Widowed    Spouse name: Not on file  . Number of children: 2  . Years of education: Not on file  . Highest education level: Not on file  Occupational History  . Occupation: retired  Tobacco Use  . Smoking status: Never Smoker  . Smokeless tobacco: Never Used  Vaping Use  . Vaping Use: Never used  Substance and Sexual Activity  . Alcohol use: No  . Drug use: No  . Sexual activity: Never  Other Topics Concern  . Not on file  Social History Narrative    Ms. Knoles is widowed. Her young grandson lives with her, for whom she shares custody with her daughter, the son's aunt.     Social Determinants of Health   Financial Resource Strain:   . Difficulty of Paying Living Expenses:   Food Insecurity:   . Worried About Charity fundraiser in the Last Year:   . Arboriculturist in the Last Year:   Transportation Needs:   . Film/video editor (Medical):   Marland Kitchen Lack of Transportation (Non-Medical):   Physical Activity:   . Days of Exercise per Week:   . Minutes of Exercise per Session:   Stress:   . Feeling of Stress :   Social Connections:   . Frequency of Communication with Friends and Family:   . Frequency of Social Gatherings with Friends and Family:   . Attends Religious Services:   . Active Member of Clubs or Organizations:   . Attends Archivist Meetings:   Marland Kitchen Marital Status:   Intimate Partner Violence:   . Fear of Current or Ex-Partner:   . Emotionally Abused:   Marland Kitchen Physically Abused:   . Sexually Abused:     FAMILY HISTORY: Family History  Problem Relation Age of Onset  .  Heart disease Mother   . Osteoarthritis Mother   . Sudden death Father   . Single kidney Father   . Other Father        h/o severe MVA injuries  . Hyperlipidemia Sister   . Other Daughter        Myalgias  . Fibromyalgia Daughter   . Allergies Daughter   . Heart disease Maternal Grandfather   . Sudden death Paternal Grandmother   . Diabetes Paternal Grandfather   . Heart disease Daughter   . Other Daughter        palpitations  . Pulmonary fibrosis Maternal Aunt   . Cancer Paternal Uncle   . Pulmonary fibrosis Maternal Aunt   . Colon cancer Neg Hx     ALLERGIES:  is allergic to iohexol and codeine phosphate.  MEDICATIONS:  Current Outpatient Medications  Medication Sig Dispense Refill  . acetaminophen (TYLENOL) 325 MG tablet Take 650 mg by mouth every 6 (six) hours as needed for moderate pain.     Marland Kitchen albuterol (VENTOLIN HFA) 108  (90 Base) MCG/ACT inhaler Inhale 2 puffs into the lungs every 6 (six) hours as needed for wheezing. 8.5 g 2  . allopurinol (ZYLOPRIM) 300 MG tablet Take 1 tablet (300 mg total) by mouth daily. 30 tablet 3  . amLODipine (NORVASC) 10 MG tablet Take 1 tablet (10 mg total) by mouth daily. 90 tablet 1  . Cholecalciferol (VITAMIN D3) 25 MCG (1000 UT) CAPS Take 1,000 Units by mouth daily.     Marland Kitchen dicyclomine (BENTYL) 10 MG capsule Take 1 capsule (10 mg total) by mouth 4 (four) times daily -  before meals and at bedtime. Needs appt for further refills. (Patient taking differently: Take 10 mg by mouth 3 (three) times daily as needed for spasms. ) 360 capsule 0  . levothyroxine (SYNTHROID) 75 MCG tablet Take 1 tablet (75 mcg total) by mouth daily. 90 tablet 3  . lidocaine-prilocaine (EMLA) cream Apply 1 application topically as needed. 30 g 1  . ondansetron (ZOFRAN) 8 MG tablet Take 1 tablet (8 mg total) by mouth every 8 (eight) hours as needed for nausea or vomiting. 30 tablet 1  . pantoprazole (PROTONIX) 40 MG tablet Take 1 tablet (40 mg total) by mouth 2 (two) times daily. After 8 weeks take only once daily. 60 tablet 1  . prochlorperazine (COMPAZINE) 10 MG tablet Take 1 tablet (10 mg total) by mouth every 6 (six) hours as needed for nausea or vomiting. 30 tablet 1  . rOPINIRole (REQUIP) 0.25 MG tablet Take 1 tablet (0.25 mg total) by mouth at bedtime. 90 tablet 1  . sucralfate (CARAFATE) 1 GM/10ML suspension Take 10 mLs (1 g total) by mouth 4 (four) times daily as needed (for throat/mouth burning). 420 mL 1  . vitamin B-12 (CYANOCOBALAMIN) 1000 MCG tablet Take 1,000 mcg by mouth daily.     No current facility-administered medications for this visit.    REVIEW OF SYSTEMS:   Constitutional: ( - ) fevers, ( - )  chills , ( - ) night sweats Eyes: ( - ) blurriness of vision, ( - ) double vision, ( - ) watery eyes Ears, nose, mouth, throat, and face: ( - ) mucositis, ( - ) sore throat Respiratory: ( - )  cough, ( - ) dyspnea, ( - ) wheezes Cardiovascular: ( - ) palpitation, ( - ) chest discomfort, ( - ) lower extremity swelling Gastrointestinal:  ( - ) nausea, ( - ) heartburn, ( - )  change in bowel habits Skin: ( - ) abnormal skin rashes Lymphatics: ( - ) new lymphadenopathy, ( - ) easy bruising Neurological: ( - ) numbness, ( - ) tingling, ( - ) new weaknesses Behavioral/Psych: ( - ) mood change, ( - ) new changes  All other systems were reviewed with the patient and are negative.  PHYSICAL EXAMINATION: ECOG PERFORMANCE STATUS: 1 - Symptomatic but completely ambulatory  Vitals:   04/19/20 1425  BP: 138/63  Pulse: (!) 103  Resp: 18  Temp: 97.7 F (36.5 C)  SpO2: 100%   Filed Weights   04/19/20 1425  Weight: 132 lb 6.4 oz (60.1 kg)    GENERAL: well appearing elderly Caucasian female in NAD  SKIN: skin color, texture, turgor are normal. Small dime sized dry skin lesions on chest, about 5 in number.  EYES: conjunctiva are pink and non-injected, sclera clear NECK: supple, non-tender. Healing incision on left neck at site of excisional biopsy.  LUNGS: clear to auscultation and percussion with normal breathing effort HEART: regular rate & rhythm and no murmurs and no lower extremity edema Musculoskeletal: no cyanosis of digits and no clubbing  PSYCH: alert & oriented x 3, fluent speech NEURO: no focal motor/sensory deficits  LABORATORY DATA:  I have reviewed the data as listed CBC Latest Ref Rng & Units 04/19/2020 04/12/2020 04/04/2020  WBC 4.0 - 10.5 K/uL 3.4(L) 7.1 3.9(L)  Hemoglobin 12.0 - 15.0 g/dL 9.7(L) 9.9(L) 11.2(L)  Hematocrit 36 - 46 % 28.3(L) 28.5(L) 32.8(L)  Platelets 150 - 400 K/uL 93(L) 116(L) 175    CMP Latest Ref Rng & Units 04/19/2020 04/12/2020 04/04/2020  Glucose 70 - 99 mg/dL 142(H) 123(H) 160(H)  BUN 8 - 23 mg/dL 8 15 23   Creatinine 0.44 - 1.00 mg/dL 1.31(H) 1.46(H) 1.69(H)  Sodium 135 - 145 mmol/L 136 135 133(L)  Potassium 3.5 - 5.1 mmol/L 3.4(L) 3.2(L)  3.4(L)  Chloride 98 - 111 mmol/L 107 105 102  CO2 22 - 32 mmol/L 21(L) 20(L) 20(L)  Calcium 8.9 - 10.3 mg/dL 9.6 9.7 9.5  Total Protein 6.5 - 8.1 g/dL 6.9 6.4(L) 6.6  Total Bilirubin 0.3 - 1.2 mg/dL 0.4 0.5 0.5  Alkaline Phos 38 - 126 U/L 62 56 45  AST 15 - 41 U/L 12(L) 19 11(L)  ALT 0 - 44 U/L 7 17 10     RADIOGRAPHIC STUDIES: I have personally reviewed the radiological images as listed and agreed with the findings in the report: FDG avid spleen and axillary lymph nodes. Involvement of cervical and inguinal nodes as well.  CT Angio Chest PE W and/or Wo Contrast  Result Date: 03/30/2020 CLINICAL DATA:  79 year old female with history of severe chest pain and hemoptysis. Nausea and vomiting. Blood in emesis. Recent history of chemotherapy for lymphoma. EXAM: CT ANGIOGRAPHY CHEST CT ABDOMEN AND PELVIS WITH CONTRAST TECHNIQUE: Multidetector CT imaging of the chest was performed using the standard protocol during bolus administration of intravenous contrast. Multiplanar CT image reconstructions and MIPs were obtained to evaluate the vascular anatomy. Multidetector CT imaging of the abdomen and pelvis was performed using the standard protocol during bolus administration of intravenous contrast. CONTRAST:  66mL OMNIPAQUE IOHEXOL 350 MG/ML SOLN COMPARISON:  CT the chest, abdomen and pelvis 11/29/2009. FINDINGS: CTA CHEST FINDINGS Cardiovascular: Today's study is limited by patient respiratory motion. With these limitations in mind, there are no central, lobar or segmental sized filling defects within the pulmonary arterial tree to suggest clinically relevant pulmonary embolism. Smaller distal subsegmental sized pulmonary embolism cannot  be entirely excluded. Heart size is normal. There is no significant pericardial fluid, thickening or pericardial calcification. There is aortic atherosclerosis, as well as atherosclerosis of the great vessels of the mediastinum and the coronary arteries, including calcified  atherosclerotic plaque in the left main and left anterior descending coronary arteries. Right internal jugular single-lumen porta cath with tip terminating in the right atrium. Mediastinum/Nodes: No pathologically enlarged mediastinal or hilar lymph nodes. Esophagus is unremarkable in appearance. Enlarged left axillary lymph nodes measuring up to 1.8 cm in short axis. No right axillary lymphadenopathy. Lungs/Pleura: No large suspicious appearing pulmonary nodules or masses are noted. Smaller pulmonary nodules are poorly evaluated on today's motion limited examination. No confluent consolidative airspace disease. No pleural effusions. Dependent areas of atelectasis and/or scarring noted throughout the lungs bilaterally. Musculoskeletal: There are no aggressive appearing lytic or blastic lesions noted in the visualized portions of the skeleton. Review of the MIP images confirms the above findings. CT ABDOMEN and PELVIS FINDINGS Hepatobiliary: No suspicious cystic or solid hepatic lesions. No intra or extrahepatic biliary ductal dilatation. Status post cholecystectomy. Pancreas: No pancreatic mass. No pancreatic ductal dilatation. No pancreatic or peripancreatic fluid collections or inflammatory changes. Spleen: Spleen is enlarged measuring 11.0 x 5.6 x 16.4 cm (estimated splenic volume of 505 mL). Adrenals/Urinary Tract: Exophytic 1.5 cm low-attenuation lesion in the lateral aspect of the interpolar region of the right kidney is compatible with a simple cyst. Left kidney and bilateral adrenal glands are normal in appearance. No hydroureteronephrosis. Urinary bladder is normal in appearance. Stomach/Bowel: Normal appearance of the stomach. No pathologic dilatation of small bowel or colon. A few scattered colonic diverticulae are noted, without surrounding inflammatory changes to suggest an acute diverticulitis at this time. Normal appendix. Vascular/Lymphatic: Aortic atherosclerosis, without evidence of aneurysm or  dissection in the abdominal or pelvic vasculature. No lymphadenopathy noted in the abdomen or pelvis. Reproductive: Status post hysterectomy. Ovaries are not confidently identified may be surgically absent or atrophic. Other: No significant volume of ascites.  No pneumoperitoneum. Musculoskeletal: There are no aggressive appearing lytic or blastic lesions noted in the visualized portions of the skeleton. Review of the MIP images confirms the above findings. IMPRESSION: 1. Despite the limitations of today's examination, there is no evidence to suggest clinically relevant central, lobar or segmental sized pulmonary embolism. 2. No acute findings are noted in the abdomen or pelvis to account for the patient's symptoms. 3. Multiple borderline enlarged and mildly enlarged left axillary lymph nodes. No lymphadenopathy noted elsewhere in the chest, abdomen or pelvis. 4. Splenomegaly. Electronically Signed   By: Vinnie Langton M.D.   On: 03/30/2020 05:41   CT ABDOMEN PELVIS W CONTRAST  Result Date: 03/30/2020 CLINICAL DATA:  79 year old female with history of severe chest pain and hemoptysis. Nausea and vomiting. Blood in emesis. Recent history of chemotherapy for lymphoma. EXAM: CT ANGIOGRAPHY CHEST CT ABDOMEN AND PELVIS WITH CONTRAST TECHNIQUE: Multidetector CT imaging of the chest was performed using the standard protocol during bolus administration of intravenous contrast. Multiplanar CT image reconstructions and MIPs were obtained to evaluate the vascular anatomy. Multidetector CT imaging of the abdomen and pelvis was performed using the standard protocol during bolus administration of intravenous contrast. CONTRAST:  48mL OMNIPAQUE IOHEXOL 350 MG/ML SOLN COMPARISON:  CT the chest, abdomen and pelvis 11/29/2009. FINDINGS: CTA CHEST FINDINGS Cardiovascular: Today's study is limited by patient respiratory motion. With these limitations in mind, there are no central, lobar or segmental sized filling defects within  the pulmonary arterial tree to  suggest clinically relevant pulmonary embolism. Smaller distal subsegmental sized pulmonary embolism cannot be entirely excluded. Heart size is normal. There is no significant pericardial fluid, thickening or pericardial calcification. There is aortic atherosclerosis, as well as atherosclerosis of the great vessels of the mediastinum and the coronary arteries, including calcified atherosclerotic plaque in the left main and left anterior descending coronary arteries. Right internal jugular single-lumen porta cath with tip terminating in the right atrium. Mediastinum/Nodes: No pathologically enlarged mediastinal or hilar lymph nodes. Esophagus is unremarkable in appearance. Enlarged left axillary lymph nodes measuring up to 1.8 cm in short axis. No right axillary lymphadenopathy. Lungs/Pleura: No large suspicious appearing pulmonary nodules or masses are noted. Smaller pulmonary nodules are poorly evaluated on today's motion limited examination. No confluent consolidative airspace disease. No pleural effusions. Dependent areas of atelectasis and/or scarring noted throughout the lungs bilaterally. Musculoskeletal: There are no aggressive appearing lytic or blastic lesions noted in the visualized portions of the skeleton. Review of the MIP images confirms the above findings. CT ABDOMEN and PELVIS FINDINGS Hepatobiliary: No suspicious cystic or solid hepatic lesions. No intra or extrahepatic biliary ductal dilatation. Status post cholecystectomy. Pancreas: No pancreatic mass. No pancreatic ductal dilatation. No pancreatic or peripancreatic fluid collections or inflammatory changes. Spleen: Spleen is enlarged measuring 11.0 x 5.6 x 16.4 cm (estimated splenic volume of 505 mL). Adrenals/Urinary Tract: Exophytic 1.5 cm low-attenuation lesion in the lateral aspect of the interpolar region of the right kidney is compatible with a simple cyst. Left kidney and bilateral adrenal glands are normal  in appearance. No hydroureteronephrosis. Urinary bladder is normal in appearance. Stomach/Bowel: Normal appearance of the stomach. No pathologic dilatation of small bowel or colon. A few scattered colonic diverticulae are noted, without surrounding inflammatory changes to suggest an acute diverticulitis at this time. Normal appendix. Vascular/Lymphatic: Aortic atherosclerosis, without evidence of aneurysm or dissection in the abdominal or pelvic vasculature. No lymphadenopathy noted in the abdomen or pelvis. Reproductive: Status post hysterectomy. Ovaries are not confidently identified may be surgically absent or atrophic. Other: No significant volume of ascites.  No pneumoperitoneum. Musculoskeletal: There are no aggressive appearing lytic or blastic lesions noted in the visualized portions of the skeleton. Review of the MIP images confirms the above findings. IMPRESSION: 1. Despite the limitations of today's examination, there is no evidence to suggest clinically relevant central, lobar or segmental sized pulmonary embolism. 2. No acute findings are noted in the abdomen or pelvis to account for the patient's symptoms. 3. Multiple borderline enlarged and mildly enlarged left axillary lymph nodes. No lymphadenopathy noted elsewhere in the chest, abdomen or pelvis. 4. Splenomegaly. Electronically Signed   By: Vinnie Langton M.D.   On: 03/30/2020 05:41   DG Chest Port 1 View  Result Date: 03/30/2020 CLINICAL DATA:  79 year old female with chest pain, recently began chemotherapy for lymphoma. EXAM: PORTABLE CHEST 1 VIEW COMPARISON:  Chest radiographs 08/05/2018 and earlier. FINDINGS: Portable AP semi upright view at 0331 hours. Right chest power port in place. Lower lung volumes. Mediastinal contours remain normal. Visualized tracheal air column is within normal limits. Allowing for portable technique the lungs are clear. Paucity of bowel gas in the upper abdomen. Right upper quadrant cholecystectomy clips. No  acute osseous abnormality identified. IMPRESSION: No acute cardiopulmonary abnormality. Electronically Signed   By: Genevie Ann M.D.   On: 03/30/2020 03:43    ASSESSMENT & PLAN LATECIA MILER 79 y.o. 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   At this time her findings are most consistent with fatigue and weakness secondary to iron deficiency anemia. The etiology of her iron deficiency anemia is the severe hematemesis that she had associated with her esophagitis. At this time I would agree with continued GI management of the esophagitis and we will proceed with IV iron supplementation. The plan will be for IV Feraheme 510 mg q. 7 days x 2 doses. We will try to have this administered as early as next week.  For her current diagonsis I recommend a rituximab based regimen, with a preference for Bendamustine/Rituximab. This regimen would consist of bendamustine 90 mg/m2 IV once per day on days 1 & 2 and rituximab 375 mg/m2 IV once on day 1.  The cycles are 28 days and the plan would be for 8 cycles. If this was not tolerated, could consider chlorambucil + rituximab vs. rituximab monotherapy.   GELF Criteria: 1 (splenomegaly). Indication for treatment  # Low Grade Marginal Zone Lymphoma, Stage III --plan to proceed with bendamustine + rituximab (regimen noted above). If patient is unable to tolerate this treatment can consider rituximab monotherapy instead. Started therapy on 03/28/2020 --patient completed excisional lymph node biopsy to confirm the diagnosis. Pathological exam of the lymph node confirms marginal zone lymphoma.  --PET CT scan confirms stage III disease. Patient meets GELF criteria for treatment (splenomegaly). She fortunately does not have any B symptoms and her counts are stable at this time, with some mild thrombocytopenia.  --patient completed port placement and chemotherapy education. --RTC for 2 week  for interval visits with 4 week treatment and repeat clinic visit.  Next visit next week.  #Concern for GI Bleed #Hematemesis, resolved --patient admitted on 03/30/2020 (day after chemotherapy) with hematemesis --no overt GI bleeding noted on Upper GI evaluation --Hgb rebounding, patient back to baseline --recommend weekly labs between now and next treatment visit. Continue to monitor Hgb.  --iron levels appear low and patient is having symptoms of iron deficiency anemia including fatigue, restless leg, and dyspnea on exertion.  --will schedule patient for IV feraheme in the coming week.   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   No orders of the defined types were placed in this encounter.   All questions were answered. The patient knows to call the clinic with any problems, questions or concerns.  A total of more than 30 minutes were spent on this encounter and over half of that time was spent on counseling and coordination of care as outlined above.   Ledell Peoples, MD Department of Hematology/Oncology [place] [facility] Phone: [phone] Pager: [phone] Email: [email or web address]  04/19/2020 3:34 PM   Literature Support:  Artist Pais der Angeline Slim BS, Lyna Poser, Hertzberg M, Kwan YL, Rody D, Craig M, [place], [place], [place], [place] DM, Munteanu M, Aram Beecham JM. 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 Blood. 2014 May 8;123(19):2944-52.  --The overall response rates for BR and R-CHOP/R-CVP were 97% and 91%, respectively (P = .0102). These data indicate BR is noninferior to standard therapy  with regard to clinical response with an acceptable safety  profile.

## 2020-04-19 NOTE — Progress Notes (Signed)
Pt changed her mind and wanted to apply for the [place] so she provided proof of income and was approved for the $1000 grant.

## 2020-04-19 NOTE — Progress Notes (Signed)
pt just had labs and flush done. she states that she has not been feeling well the last couple of days. feeling weak... she would like to speak with either Dr Lorenso Courier or his nurse [facility] RN before leaving since she stays near [place]. she in in front lobby now waiting. Made Dr Lorenso Courier and Eustaquio Maize RN aware of patient concern at let them know that she is currently waiting in lobby.

## 2020-04-19 NOTE — Progress Notes (Signed)
Calls made to [facility] to arrange for IV fereheme on 04/23/20 as pt will be there for a swallowing test. Message left for scheduler to schedule this infusion.  Received call back from [facility]. Pt can get her IV iron on 04/25/20 @ 1:15 pm Call made to patient and her daughter to notify of this appt.

## 2020-04-23 ENCOUNTER — Other Ambulatory Visit: Payer: Self-pay

## 2020-04-23 ENCOUNTER — Ambulatory Visit (HOSPITAL_COMMUNITY)
Admission: RE | Admit: 2020-04-23 | Discharge: 2020-04-23 | Disposition: A | Discharge status: Home / Self Care | Payer: Medicare Other | Source: Ambulatory Visit | Attending: Nurse Practitioner | Admitting: Nurse Practitioner

## 2020-04-23 DIAGNOSIS — K449 Diaphragmatic hernia without obstruction or gangrene: Secondary | ICD-10-CM | POA: Diagnosis not present

## 2020-04-23 DIAGNOSIS — R1310 Dysphagia, unspecified: Secondary | ICD-10-CM | POA: Diagnosis not present

## 2020-04-23 DIAGNOSIS — R197 Diarrhea, unspecified: Secondary | ICD-10-CM | POA: Diagnosis not present

## 2020-04-23 DIAGNOSIS — K2101 Gastro-esophageal reflux disease with esophagitis, with bleeding: Secondary | ICD-10-CM | POA: Diagnosis not present

## 2020-04-23 DIAGNOSIS — R1319 Other dysphagia: Secondary | ICD-10-CM

## 2020-04-23 IMAGING — RF DG ESOPHAGUS
8 series · 14 of 24 positions shown · non-contrast
Comparison: CT chest [DATE]

CLINICAL DATA: Dysphagia. Reflux. History of stroke in [place]. History
of prior esophageal dilation in [6N].

EXAM:
ESOPHOGRAM / BARIUM SWALLOW / BARIUM TABLET STUDY
TECHNIQUE: Combined double contrast and single contrast examination performed
using effervescent crystals, thick barium liquid, and thin barium
liquid. The patient was observed with fluoroscopy swallowing a 13 mm
barium sulphate tablet.
FLUOROSCOPY TIME:  Fluoroscopy Time:  1 minutes, 54 seconds
Radiation Exposure Index (if provided by the fluoroscopic device):
13.4 mGy
Number of Acquired Spot Images: 0

[Series 1: cp_standard · 0.17mm/px · 2 of 42 frames shown (1 of 8)]
[frame 7/42]
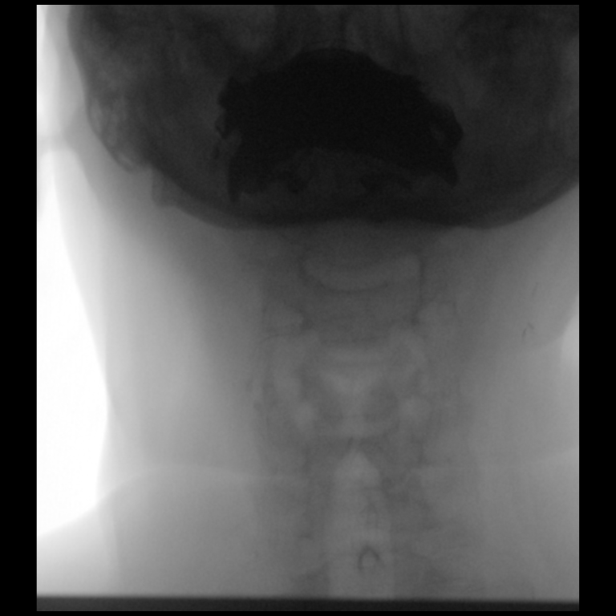
[frame 36/42]
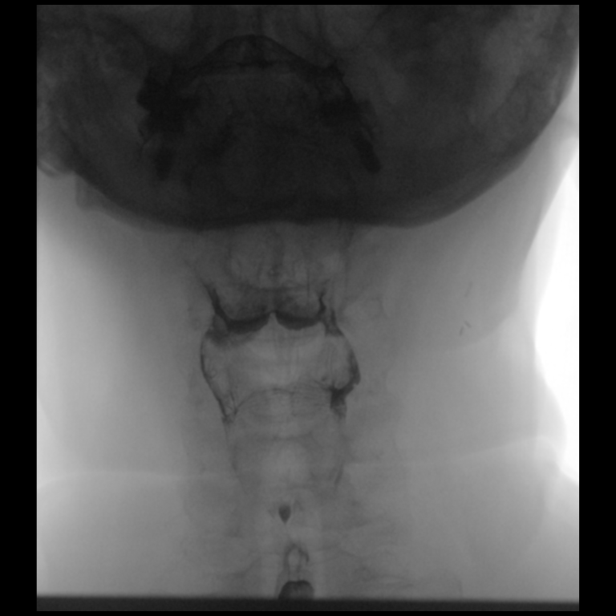

[Series 2: cp_standard · 0.17mm/px · 1 of 61 frames shown (2 of 8)]
[frame 31/61]
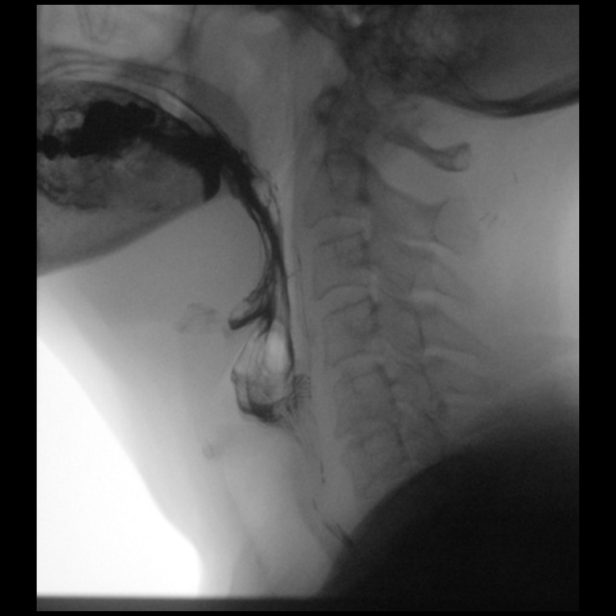

[Series 3: cp_standard · 0.17mm/px · 2 of 167 frames shown (3 of 8)]
[frame 12/167]
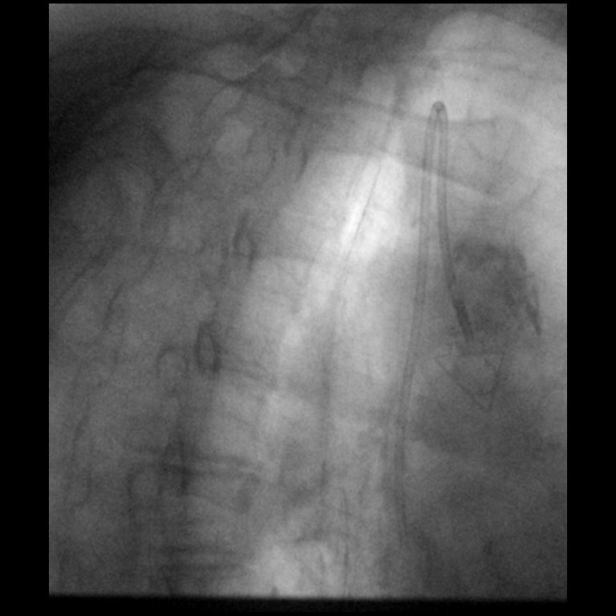
[frame 26/167]
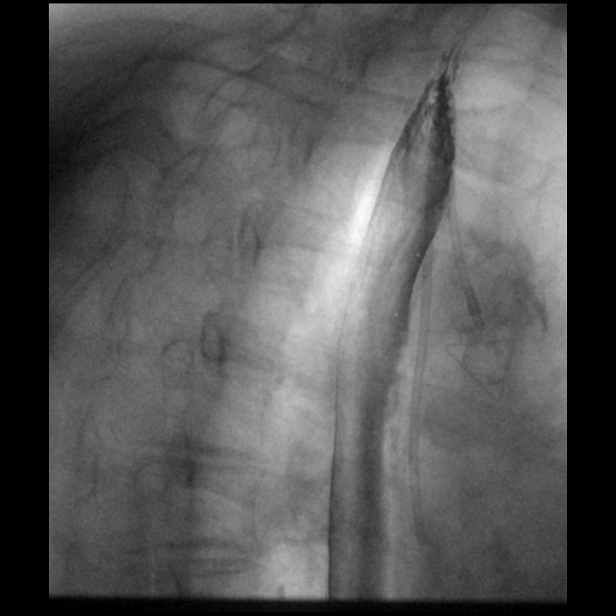

[Series 4: cp_standard · 0.18mm/px · 2 of 37 frames shown (4 of 8)]
[frame 6/37]
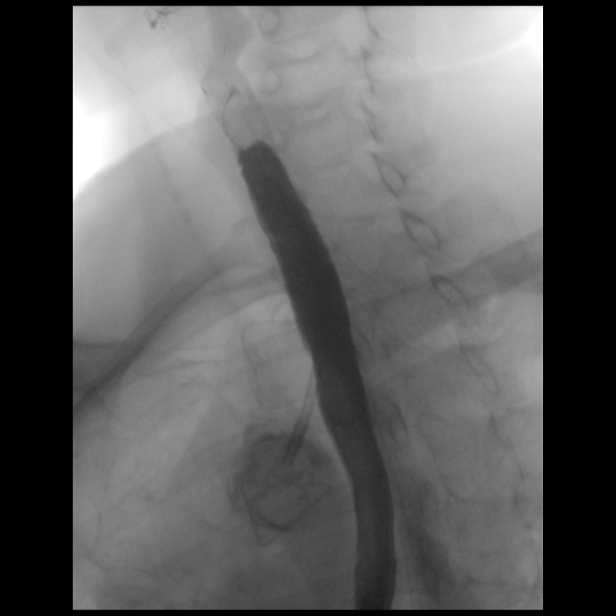
[frame 37/37]
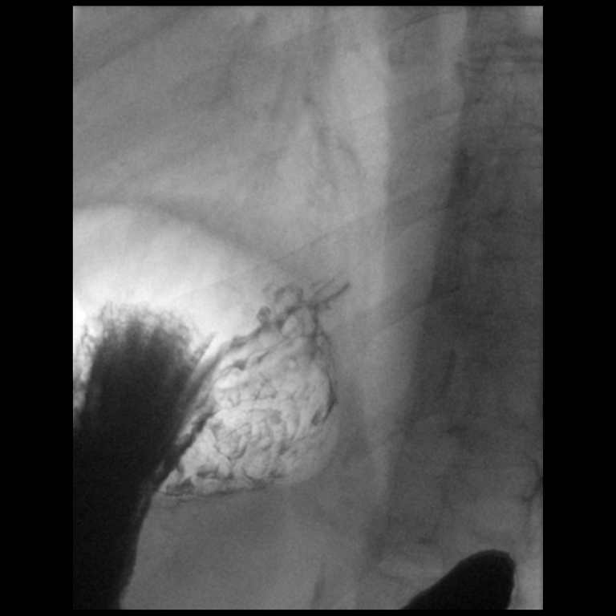

[Series 5: cp_standard · 0.18mm/px · 2 of 36 frames shown (5 of 8)]
[frame 6/36]
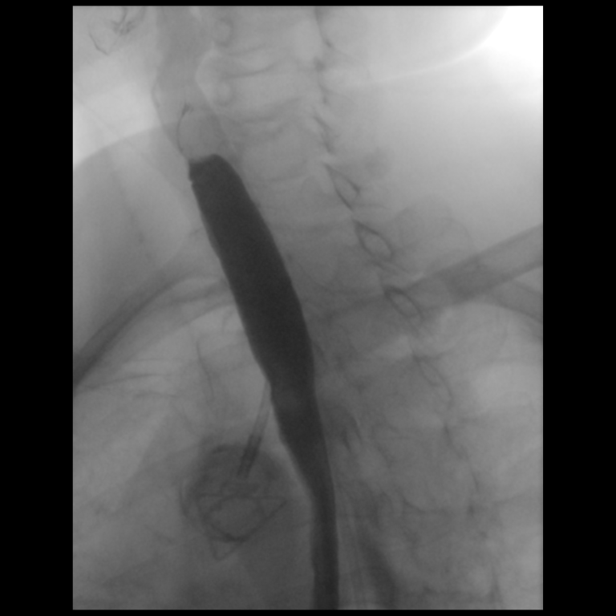
[frame 36/36]
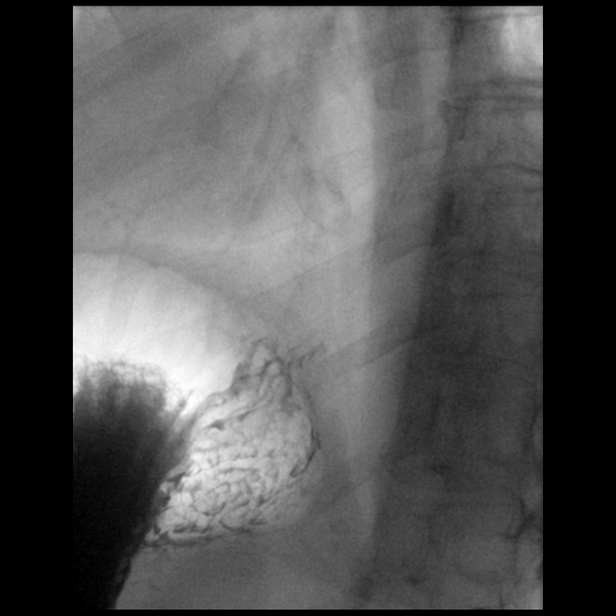

[Series 6: cp_standard · 0.18mm/px · 1 of 32 frames shown (6 of 8)]
[frame 28/32]
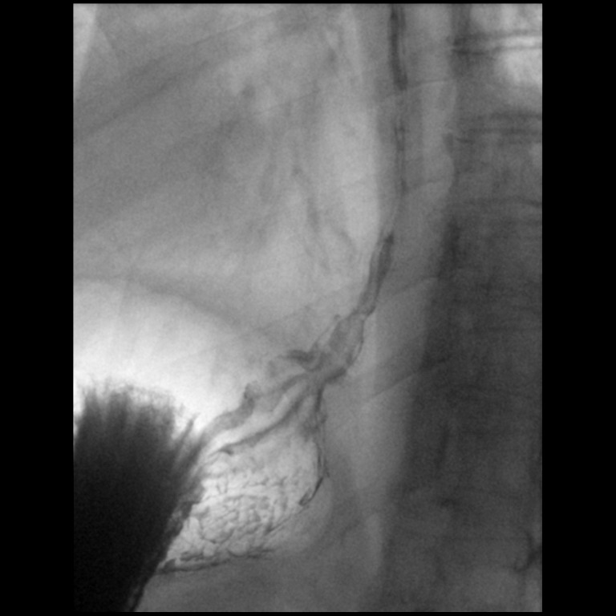

[Series 7: cp_standard · 0.18mm/px · 2 of 93 frames shown (7 of 8)]
[frame 14/93]
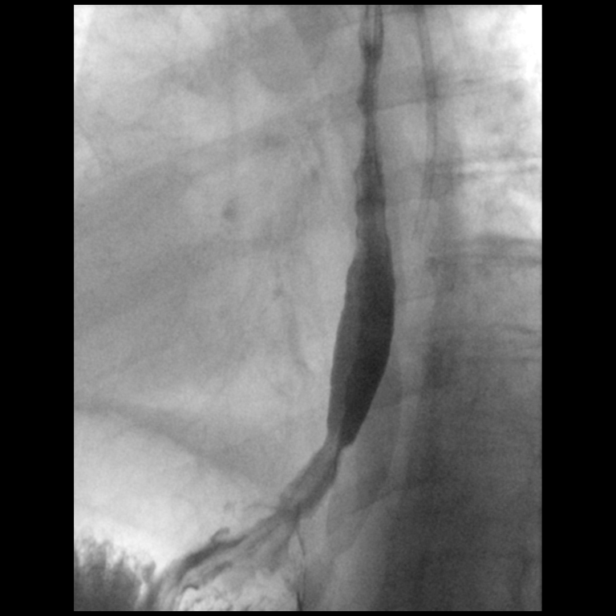
[frame 64/93]
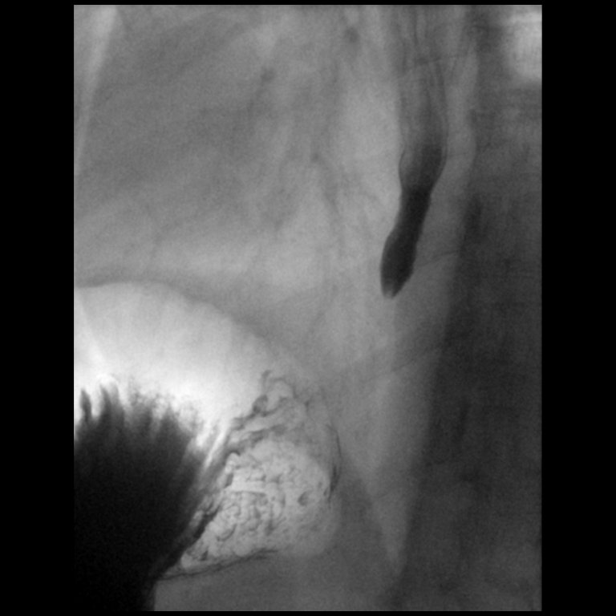

[Series 8: cp_standard · 0.18mm/px · 2 of 21 frames shown (8 of 8)]
[frame 4/21]
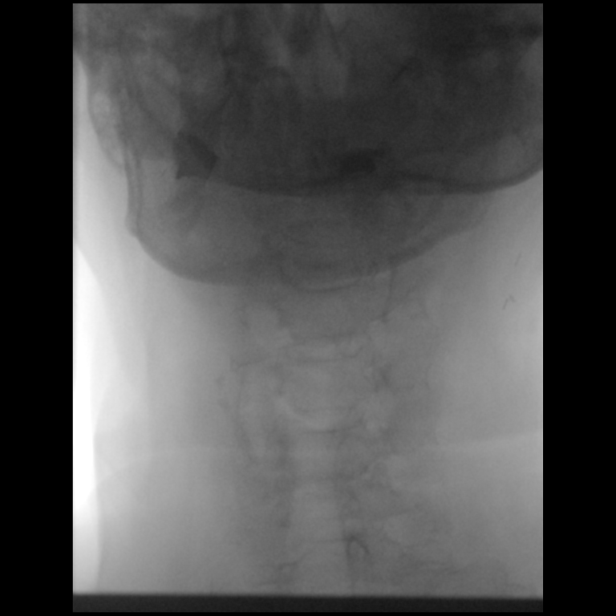
[frame 20/21]
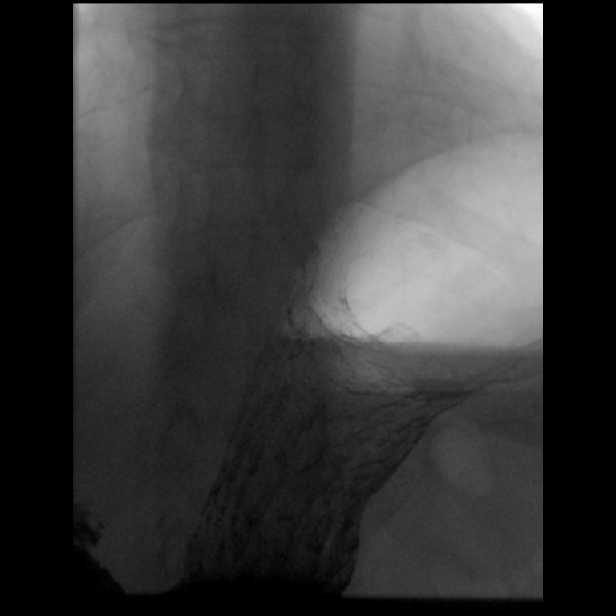

[14 of 24 positions shown; findings below may reference images not displayed]

FINDINGS: Normal pharyngeal phase of swallowing. Primary peristaltic waves in
the esophagus were normal on [DATE] swallows.

Small type 1 hiatal hernia as shown on image [DATE]. There is a widely
patent distal esophageal mucosal ring shown on image [DATE]. Just
above this, in the distal esophagus, there is a smoothly marginated
focal narrowing/stricture measuring over about 0.4 cm in length. I
was only able to distend this up to about 1.0 cm in diameter, but on
the other hand a 13 mm barium tablet was able to pass through this
region without difficulty, and pass into the stomach. There is no
significant irregularity along this narrowing.

Right-sided Port-A-Cath noted.  Atherosclerotic aortic arch.
IMPRESSION: 1. Small type 1 hiatal hernia.
2. Smoothly marginated focal narrowing/stricture in the distal
esophagus just above the level of the Schatzki ring, only able to
distend up to about 1.0 cm in diameter. However, a 13 mm barium
tablet passed through this narrowing without impaction.
3. Widely patent distal esophageal mucosal ring.
4. Right-sided Port-A-Cath noted.
5.  Aortic Atherosclerosis ([6N]-[6N]).

## 2020-04-24 LAB — SURGICAL PATHOLOGY

## 2020-04-25 ENCOUNTER — Inpatient Hospital Stay (HOSPITAL_COMMUNITY): Payer: Medicare Other | Attending: Hematology

## 2020-04-25 ENCOUNTER — Other Ambulatory Visit: Payer: Self-pay | Admitting: Hematology and Oncology

## 2020-04-25 ENCOUNTER — Other Ambulatory Visit: Payer: Self-pay

## 2020-04-25 VITALS — BP 115/51 | HR 84 | Temp 97.3°F | Resp 18

## 2020-04-25 DIAGNOSIS — C8580 Other specified types of non-Hodgkin lymphoma, unspecified site: Secondary | ICD-10-CM

## 2020-04-25 DIAGNOSIS — D509 Iron deficiency anemia, unspecified: Secondary | ICD-10-CM | POA: Diagnosis not present

## 2020-04-25 DIAGNOSIS — Z95828 Presence of other vascular implants and grafts: Secondary | ICD-10-CM

## 2020-04-25 MED ORDER — SODIUM CHLORIDE 0.9 % IV SOLN: Freq: Once | INTRAVENOUS | Status: AC

## 2020-04-25 MED ORDER — SODIUM CHLORIDE 0.9 % IV SOLN
510.0000 mg | Freq: Once | INTRAVENOUS | Status: AC
  Administered 2020-04-25: 510 mg via INTRAVENOUS
  Filled 2020-04-25: qty 17

## 2020-04-25 NOTE — Patient Instructions (Signed)
[facility]  Discharge Instructions:   _______________________________________________________________  Thank you for choosing [facility] at [facility] to provide your oncology and hematology care.  To afford each patient quality time with our providers, please arrive at least 15 minutes before your scheduled appointment.  You need to re-schedule your appointment if you arrive 10 or more minutes late.  We strive to give you quality time with our providers, and arriving late affects you and other patients whose appointments are after yours.  Also, if you no show three or more times for appointments you may be dismissed from the clinic.  Again, thank you for choosing [facility] at [facility]. Our hope is that these requests will allow you access to exceptional care and in a timely manner. _______________________________________________________________  If you have questions after your visit, please contact our office at [phone] between the hours of 8:30 a.m. and 5:00 p.m. Voicemails left after 4:30 p.m. will not be returned until the following business day. _______________________________________________________________  For prescription refill requests, have your pharmacy contact our office. _______________________________________________________________  Recommendations made by the consultant and any test results will be sent to your referring physician. _______________________________________________________________ 

## 2020-04-25 NOTE — Progress Notes (Signed)
Elizabeth Mcintyre [phone]   [phone]  PROGRESS NOTE  Patient Care Team: Ma Hillock, DO as PCP - General (Family Medicine) Danie Binder, MD (Inactive) as Consulting Physician (Gastroenterology) Gala Romney Cristopher Estimable, MD as Consulting Physician (Gastroenterology) Minus Breeding, MD as Consulting Physician (Cardiology) Annitta Needs, NP (Gastroenterology) Carlis Stable, NP as Nurse Practitioner (Gastroenterology)  Hematological/Oncological History  # Low Grade Marginal Zone Lymphoma, Stage III 1) 06/06/2019: patient underwent a diagnostic mammogram of the left breast. Calcifications noted in the left breast, recommended 6 month f/u imaging. 2) 02/02/2020: bilateral diagnostic mammogram showedabnormal enlarged lymph nodes with cortical thickening. The largest of these measures 2.2 x 1.7 x 2.0 cm. 3) 02/02/2020: US guided biopsy of left axillary lymph nodes performed. 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 4) 02/16/2020: establish care with Dr. Lorenso Courier 5) 02/29/2020: PET CT scan performed, showed hypermetabolic lymph nodes identified in the posterior left neck, both axillary/subpectoral regions, mediastinum, hila, right external iliac chain, and right groin. Massive splenomegaly of 17.8 cm noted as well.  6) 03/20/2020: excisional biopsy of left posterior cervical lymph node. Biopsy confirmed most likely low grade marginal zone lymphoma.   7) 7/14-7/15/2021: Cycle 1 Day 1 of Rituximab/Bendamycin 8) 03/30/2020-04/01/2020: admitted inpatient for hematemesis. Noted to have marked esophagitis on EGD.  9) 04/25/2020: IV feraheme 510mg  administered 10) 04/26/2020: Cycle 2 Day 1 of Rituximab/Bendamycin  Interval History:  Elizabeth Mcintyre 79 y.o. female with medical history  significant for low grade marginal zone lymphoma stage IIIA who presents for a follow up visit. The patient's last visit was on 04/19/2020 as an urgent visit for fatigue. In the interim she has received 1 dose of IV feraheme.   On exam today Elizabeth Mcintyre notes that she received the IV iron infusion yesterday and did not have any difficulty with it.  She notes that her energy is still low and about the same as it was during her last visit.  She notes that she is not having any issues with nausea, vomiting, diarrhea, fevers, chills, sweats.  She reports that her appetite has been "nonexistent".  She reports that she has to force herself to eat and that most things do not have taste or flavor.  She has been trying to eat foods high in starch such as pasta, but last night did not eat the pasta her daughter cooked because she prefers it when she prepares herself.  She notes that she has not needed her nausea medications much in the meantime.  She notes that she is tolerating the sulcal fate okay and is not having any symptoms of GERD at this time.  On further discussion she notes he is not seeing any overt signs of bleeding, bruising, or dark stools since our last discussion. A full 10 point ROS is listed below.  MEDICAL HISTORY:  Past Medical History:  Diagnosis Date   Asthma    Bloating 04/26/2019   Cancer ([place]) 2021   Lymphoma   Chronic SI joint pain    was on tramadol   Depression with anxiety 04/03/2011   DIABETES MELLITUS, TYPE II 11/09/2007   diet control   Diverticulosis 03/2011   GERD (gastroesophageal reflux disease)    none recently   GI bleed    History of rheumatoid arthritis    during 30's, was treated.   Hyperlipidemia  Hypertension    IBS (irritable bowel syndrome)    Osteopenia 2017   Last  bone density 05/04/2017: -2.4   PONV (postoperative nausea and vomiting)    Stress incontinence    Stroke ([place])    mini stroke - found on a CT scan    SURGICAL  HISTORY: Past Surgical History:  Procedure Laterality Date   ABDOMINAL HYSTERECTOMY     BIOPSY  03/30/2020   Procedure: BIOPSY;  Surgeon: Ronald Lobo, MD;  Location: [facility];  Service: Endoscopy;;   CARDIAC CATHETERIZATION     X 2, last one in [place] 04/13/2017   Procedure: LAPAROSCOPIC CHOLECYSTECTOMY;  Surgeon: Aviva Signs, MD;  Location: [facility];  Service: General;  Laterality: N/A;   COLONOSCOPY     COLONOSCOPY  May 2012   Dr. Olevia Perches: mild diverticulosis, otherwise normal.    ESOPHAGOGASTRODUODENOSCOPY N/A 01/28/2015   Dr. Gala Romney: reflux esophagitis, Schatzki's ring not manipulated due to recent bleeding   ESOPHAGOGASTRODUODENOSCOPY N/A 03/30/2015   Dr. Gala Romney: Schatzki's ring s/p Venia Minks dilation, previously noted esophageal ulcer completely healed   ESOPHAGOGASTRODUODENOSCOPY N/A 03/30/2020   Procedure: ESOPHAGOGASTRODUODENOSCOPY (EGD);  Surgeon: Ronald Lobo, MD;  Location: [facility];  Service: Endoscopy;  Laterality: N/A;   IR IMAGING GUIDED PORT INSERTION  03/13/2020   LYMPH NODE BIOPSY Left 03/20/2020   Procedure: LEFT POSTERIOR CERVICAL LYMPH NODE BIOPSY;  Surgeon: Georganna Skeans, MD;  Location: [place];  Service: General;  Laterality: Left;   MALONEY DILATION N/A 03/30/2015   Procedure: Keturah Shavers;  Surgeon: Daneil Dolin, MD;  Location: [facility];  Service: Endoscopy;  Laterality: N/A;    SOCIAL HISTORY: Social History   Socioeconomic History   Marital status: Widowed    Spouse name: Not on file   Number of children: 2   Years of education: Not on file   Highest education level: Not on file  Occupational History   Occupation: retired  Tobacco Use   Smoking status: Never Smoker   Smokeless tobacco: Never Used  Scientific laboratory technician Use: Never used  Substance and Sexual Activity   Alcohol use: No   Drug use: No   Sexual activity: Never  Other Topics Concern   Not on file  Social History Narrative    [name] is widowed. Her young grandson lives with her, for whom she shares custody with her daughter, the son's aunt.     Social Determinants of Health   Financial Resource Strain:    Difficulty of Paying Living Expenses:   Food Insecurity:    Worried About Charity fundraiser in the Last Year:    Arboriculturist in the Last Year:   Transportation Needs:    Film/video editor (Medical):    Lack of Transportation (Non-Medical):   Physical Activity:    Days of Exercise per Week:    Minutes of Exercise per Session:   Stress:    Feeling of Stress :   Social Connections:    Frequency of Communication with Friends and Family:    Frequency of Social Gatherings with Friends and Family:    Attends Religious Services:    Active Member of Clubs or Organizations:    Attends Music therapist:    Marital Status:   Intimate Partner Violence:    Fear of Current or Ex-Partner:    Emotionally Abused:    Physically Abused:    Sexually Abused:     FAMILY HISTORY: Family History  Problem Relation Age of Onset   Heart disease Mother    Osteoarthritis Mother    Sudden death Father    Single kidney Father    Other Father        h/o severe MVA injuries   Hyperlipidemia Sister    Other Daughter        Myalgias   Fibromyalgia Daughter    Allergies Daughter    Heart disease Maternal Grandfather    Sudden death Paternal Grandmother    Diabetes Paternal Grandfather    Heart disease Daughter    Other Daughter        palpitations   Pulmonary fibrosis Maternal Aunt    Cancer Paternal Uncle    Pulmonary fibrosis Maternal Aunt    Colon cancer Neg Hx     ALLERGIES:  is allergic to iohexol and codeine phosphate.  MEDICATIONS:  Current Outpatient Medications  Medication Sig Dispense Refill   acetaminophen (TYLENOL) 325 MG tablet Take 650 mg by mouth every 6 (six) hours as needed for moderate pain.      albuterol (VENTOLIN HFA) 108  (90 Base) MCG/ACT inhaler Inhale 2 puffs into the lungs every 6 (six) hours as needed for wheezing. 8.5 g 2   allopurinol (ZYLOPRIM) 300 MG tablet Take 1 tablet (300 mg total) by mouth daily. 30 tablet 3   amLODipine (NORVASC) 10 MG tablet Take 1 tablet (10 mg total) by mouth daily. 90 tablet 1   Cholecalciferol (VITAMIN D3) 25 MCG (1000 UT) CAPS Take 1,000 Units by mouth daily.      dicyclomine (BENTYL) 10 MG capsule Take 1 capsule (10 mg total) by mouth 4 (four) times daily -  before meals and at bedtime. Needs appt for further refills. (Patient taking differently: Take 10 mg by mouth 3 (three) times daily as needed for spasms. ) 360 capsule 0   levothyroxine (SYNTHROID) 75 MCG tablet Take 1 tablet (75 mcg total) by mouth daily. 90 tablet 3   lidocaine-prilocaine (EMLA) cream Apply 1 application topically as needed. 30 g 1   ondansetron (ZOFRAN) 8 MG tablet Take 1 tablet (8 mg total) by mouth every 8 (eight) hours as needed for nausea or vomiting. 30 tablet 1   pantoprazole (PROTONIX) 40 MG tablet Take 1 tablet (40 mg total) by mouth 2 (two) times daily. After 8 weeks take only once daily. 60 tablet 1   prochlorperazine (COMPAZINE) 10 MG tablet Take 1 tablet (10 mg total) by mouth every 6 (six) hours as needed for nausea or vomiting. 30 tablet 1   rOPINIRole (REQUIP) 0.25 MG tablet Take 1 tablet (0.25 mg total) by mouth at bedtime. 90 tablet 1   sucralfate (CARAFATE) 1 GM/10ML suspension Take 10 mLs (1 g total) by mouth 4 (four) times daily as needed (for throat/mouth burning). 420 mL 1   vitamin B-12 (CYANOCOBALAMIN) 1000 MCG tablet Take 1,000 mcg by mouth daily.     No current facility-administered medications for this visit.    REVIEW OF SYSTEMS:   Constitutional: ( - ) fevers, ( - )  chills , ( - ) night sweats Eyes: ( - ) blurriness of vision, ( - ) double vision, ( - ) watery eyes Ears, nose, mouth, throat, and face: ( - ) mucositis, ( - ) sore throat Respiratory: ( - )  cough, ( - ) dyspnea, ( - ) wheezes Cardiovascular: ( - ) palpitation, ( - ) chest discomfort, ( - ) lower extremity swelling Gastrointestinal:  ( - ) nausea, ( - )  heartburn, ( - ) change in bowel habits Skin: ( - ) abnormal skin rashes Lymphatics: ( - ) new lymphadenopathy, ( - ) easy bruising Neurological: ( - ) numbness, ( - ) tingling, ( - ) new weaknesses Behavioral/Psych: ( - ) mood change, ( - ) new changes  All other systems were reviewed with the patient and are negative.  PHYSICAL EXAMINATION: ECOG PERFORMANCE STATUS: 1 - Symptomatic but completely ambulatory  Vitals:   04/26/20 0807  BP: (!) 143/71  Pulse: 91  Resp: 18  Temp: (!) 96.8 F (36 C)  SpO2: 100%   Filed Weights   04/26/20 0807  Weight: 130 lb 9.6 oz (59.2 kg)    GENERAL: well appearing elderly Caucasian female in NAD  SKIN: skin color, texture, turgor are normal. Small dime sized dry skin lesions on chest, about 5 in number.  EYES: conjunctiva are pink and non-injected, sclera clear NECK: supple, non-tender. Healing incision on left neck at site of excisional biopsy.  LUNGS: clear to auscultation and percussion with normal breathing effort HEART: regular rate & rhythm and no murmurs and no lower extremity edema Musculoskeletal: no cyanosis of digits and no clubbing  PSYCH: alert & oriented x 3, fluent speech NEURO: no focal motor/sensory deficits  LABORATORY DATA:  I have reviewed the data as listed CBC Latest Ref Rng & Units 04/26/2020 04/19/2020 04/12/2020  WBC 4.0 - 10.5 K/uL 3.9(L) 3.4(L) 7.1  Hemoglobin 12.0 - 15.0 g/dL 10.3(L) 9.7(L) 9.9(L)  Hematocrit 36 - 46 % 29.8(L) 28.3(L) 28.5(L)  Platelets 150 - 400 K/uL 106(L) 93(L) 116(L)    CMP Latest Ref Rng & Units 04/26/2020 04/19/2020 04/12/2020  Glucose 70 - 99 mg/dL 206(H) 142(H) 123(H)  BUN 8 - 23 mg/dL 11 8 15   Creatinine 0.44 - 1.00 mg/dL 1.38(H) 1.31(H) 1.46(H)  Sodium 135 - 145 mmol/L 136 136 135  Potassium 3.5 - 5.1 mmol/L 3.0(LL) 3.4(L)  3.2(L)  Chloride 98 - 111 mmol/L 103 107 105  CO2 22 - 32 mmol/L 19(L) 21(L) 20(L)  Calcium 8.9 - 10.3 mg/dL 9.7 9.6 9.7  Total Protein 6.5 - 8.1 g/dL 6.9 6.9 6.4(L)  Total Bilirubin 0.3 - 1.2 mg/dL 0.5 0.4 0.5  Alkaline Phos 38 - 126 U/L 58 62 56  AST 15 - 41 U/L 15 12(L) 19  ALT 0 - 44 U/L 8 7 17     RADIOGRAPHIC STUDIES: I have personally reviewed the radiological images as listed and agreed with the findings in the report: FDG avid spleen and axillary lymph nodes. Involvement of cervical and inguinal nodes as well.  CT Angio Chest PE W and/or Wo Contrast  Result Date: 03/30/2020 CLINICAL DATA:  79 year old female with history of severe chest pain and hemoptysis. Nausea and vomiting. Blood in emesis. Recent history of chemotherapy for lymphoma. EXAM: CT ANGIOGRAPHY CHEST CT ABDOMEN AND PELVIS WITH CONTRAST TECHNIQUE: Multidetector CT imaging of the chest was performed using the standard protocol during bolus administration of intravenous contrast. Multiplanar CT image reconstructions and MIPs were obtained to evaluate the vascular anatomy. Multidetector CT imaging of the abdomen and pelvis was performed using the standard protocol during bolus administration of intravenous contrast. CONTRAST:  53mL OMNIPAQUE IOHEXOL 350 MG/ML SOLN COMPARISON:  CT the chest, abdomen and pelvis 11/29/2009. FINDINGS: CTA CHEST FINDINGS Cardiovascular: Today's study is limited by patient respiratory motion. With these limitations in mind, there are no central, lobar or segmental sized filling defects within the pulmonary arterial tree to suggest clinically relevant pulmonary embolism. Smaller distal  subsegmental sized pulmonary embolism cannot be entirely excluded. Heart size is normal. There is no significant pericardial fluid, thickening or pericardial calcification. There is aortic atherosclerosis, as well as atherosclerosis of the great vessels of the mediastinum and the coronary arteries, including calcified  atherosclerotic plaque in the left main and left anterior descending coronary arteries. Right internal jugular single-lumen porta cath with tip terminating in the right atrium. Mediastinum/Nodes: No pathologically enlarged mediastinal or hilar lymph nodes. Esophagus is unremarkable in appearance. Enlarged left axillary lymph nodes measuring up to 1.8 cm in short axis. No right axillary lymphadenopathy. Lungs/Pleura: No large suspicious appearing pulmonary nodules or masses are noted. Smaller pulmonary nodules are poorly evaluated on today's motion limited examination. No confluent consolidative airspace disease. No pleural effusions. Dependent areas of atelectasis and/or scarring noted throughout the lungs bilaterally. Musculoskeletal: There are no aggressive appearing lytic or blastic lesions noted in the visualized portions of the skeleton. Review of the MIP images confirms the above findings. CT ABDOMEN and PELVIS FINDINGS Hepatobiliary: No suspicious cystic or solid hepatic lesions. No intra or extrahepatic biliary ductal dilatation. Status post cholecystectomy. Pancreas: No pancreatic mass. No pancreatic ductal dilatation. No pancreatic or peripancreatic fluid collections or inflammatory changes. Spleen: Spleen is enlarged measuring 11.0 x 5.6 x 16.4 cm (estimated splenic volume of 505 mL). Adrenals/Urinary Tract: Exophytic 1.5 cm low-attenuation lesion in the lateral aspect of the interpolar region of the right kidney is compatible with a simple cyst. Left kidney and bilateral adrenal glands are normal in appearance. No hydroureteronephrosis. Urinary bladder is normal in appearance. Stomach/Bowel: Normal appearance of the stomach. No pathologic dilatation of small bowel or colon. A few scattered colonic diverticulae are noted, without surrounding inflammatory changes to suggest an acute diverticulitis at this time. Normal appendix. Vascular/Lymphatic: Aortic atherosclerosis, without evidence of aneurysm or  dissection in the abdominal or pelvic vasculature. No lymphadenopathy noted in the abdomen or pelvis. Reproductive: Status post hysterectomy. Ovaries are not confidently identified may be surgically absent or atrophic. Other: No significant volume of ascites.  No pneumoperitoneum. Musculoskeletal: There are no aggressive appearing lytic or blastic lesions noted in the visualized portions of the skeleton. Review of the MIP images confirms the above findings. IMPRESSION: 1. Despite the limitations of today's examination, there is no evidence to suggest clinically relevant central, lobar or segmental sized pulmonary embolism. 2. No acute findings are noted in the abdomen or pelvis to account for the patient's symptoms. 3. Multiple borderline enlarged and mildly enlarged left axillary lymph nodes. No lymphadenopathy noted elsewhere in the chest, abdomen or pelvis. 4. Splenomegaly. Electronically Signed   By: Vinnie Langton M.D.   On: 03/30/2020 05:41   CT ABDOMEN PELVIS W CONTRAST  Result Date: 03/30/2020 CLINICAL DATA:  79 year old female with history of severe chest pain and hemoptysis. Nausea and vomiting. Blood in emesis. Recent history of chemotherapy for lymphoma. EXAM: CT ANGIOGRAPHY CHEST CT ABDOMEN AND PELVIS WITH CONTRAST TECHNIQUE: Multidetector CT imaging of the chest was performed using the standard protocol during bolus administration of intravenous contrast. Multiplanar CT image reconstructions and MIPs were obtained to evaluate the vascular anatomy. Multidetector CT imaging of the abdomen and pelvis was performed using the standard protocol during bolus administration of intravenous contrast. CONTRAST:  21mL OMNIPAQUE IOHEXOL 350 MG/ML SOLN COMPARISON:  CT the chest, abdomen and pelvis 11/29/2009. FINDINGS: CTA CHEST FINDINGS Cardiovascular: Today's study is limited by patient respiratory motion. With these limitations in mind, there are no central, lobar or segmental sized filling defects within  the pulmonary arterial tree to suggest clinically relevant pulmonary embolism. Smaller distal subsegmental sized pulmonary embolism cannot be entirely excluded. Heart size is normal. There is no significant pericardial fluid, thickening or pericardial calcification. There is aortic atherosclerosis, as well as atherosclerosis of the great vessels of the mediastinum and the coronary arteries, including calcified atherosclerotic plaque in the left main and left anterior descending coronary arteries. Right internal jugular single-lumen porta cath with tip terminating in the right atrium. Mediastinum/Nodes: No pathologically enlarged mediastinal or hilar lymph nodes. Esophagus is unremarkable in appearance. Enlarged left axillary lymph nodes measuring up to 1.8 cm in short axis. No right axillary lymphadenopathy. Lungs/Pleura: No large suspicious appearing pulmonary nodules or masses are noted. Smaller pulmonary nodules are poorly evaluated on today's motion limited examination. No confluent consolidative airspace disease. No pleural effusions. Dependent areas of atelectasis and/or scarring noted throughout the lungs bilaterally. Musculoskeletal: There are no aggressive appearing lytic or blastic lesions noted in the visualized portions of the skeleton. Review of the MIP images confirms the above findings. CT ABDOMEN and PELVIS FINDINGS Hepatobiliary: No suspicious cystic or solid hepatic lesions. No intra or extrahepatic biliary ductal dilatation. Status post cholecystectomy. Pancreas: No pancreatic mass. No pancreatic ductal dilatation. No pancreatic or peripancreatic fluid collections or inflammatory changes. Spleen: Spleen is enlarged measuring 11.0 x 5.6 x 16.4 cm (estimated splenic volume of 505 mL). Adrenals/Urinary Tract: Exophytic 1.5 cm low-attenuation lesion in the lateral aspect of the interpolar region of the right kidney is compatible with a simple cyst. Left kidney and bilateral adrenal glands are normal  in appearance. No hydroureteronephrosis. Urinary bladder is normal in appearance. Stomach/Bowel: Normal appearance of the stomach. No pathologic dilatation of small bowel or colon. A few scattered colonic diverticulae are noted, without surrounding inflammatory changes to suggest an acute diverticulitis at this time. Normal appendix. Vascular/Lymphatic: Aortic atherosclerosis, without evidence of aneurysm or dissection in the abdominal or pelvic vasculature. No lymphadenopathy noted in the abdomen or pelvis. Reproductive: Status post hysterectomy. Ovaries are not confidently identified may be surgically absent or atrophic. Other: No significant volume of ascites.  No pneumoperitoneum. Musculoskeletal: There are no aggressive appearing lytic or blastic lesions noted in the visualized portions of the skeleton. Review of the MIP images confirms the above findings. IMPRESSION: 1. Despite the limitations of today's examination, there is no evidence to suggest clinically relevant central, lobar or segmental sized pulmonary embolism. 2. No acute findings are noted in the abdomen or pelvis to account for the patient's symptoms. 3. Multiple borderline enlarged and mildly enlarged left axillary lymph nodes. No lymphadenopathy noted elsewhere in the chest, abdomen or pelvis. 4. Splenomegaly. Electronically Signed   By: Vinnie Langton M.D.   On: 03/30/2020 05:41   DG Chest Port 1 View  Result Date: 03/30/2020 CLINICAL DATA:  79 year old female with chest pain, recently began chemotherapy for lymphoma. EXAM: PORTABLE CHEST 1 VIEW COMPARISON:  Chest radiographs 08/05/2018 and earlier. FINDINGS: Portable AP semi upright view at 0331 hours. Right chest power port in place. Lower lung volumes. Mediastinal contours remain normal. Visualized tracheal air column is within normal limits. Allowing for portable technique the lungs are clear. Paucity of bowel gas in the upper abdomen. Right upper quadrant cholecystectomy clips. No  acute osseous abnormality identified. IMPRESSION: No acute cardiopulmonary abnormality. Electronically Signed   By: Genevie Ann M.D.   On: 03/30/2020 03:43   DG ESOPHAGUS W DOUBLE CM (HD)  Result Date: 04/23/2020 CLINICAL DATA:  Dysphagia. Reflux. History of stroke in [place].  History of prior esophageal dilation in 2016. EXAM: ESOPHOGRAM / BARIUM SWALLOW / BARIUM TABLET STUDY TECHNIQUE: Combined double contrast and single contrast examination performed using effervescent crystals, thick barium liquid, and thin barium liquid. The patient was observed with fluoroscopy swallowing a 13 mm barium sulphate tablet. FLUOROSCOPY TIME:  Fluoroscopy Time:  1 minutes, 54 seconds Radiation Exposure Index (if provided by the fluoroscopic device): 13.4 mGy Number of Acquired Spot Images: 0 COMPARISON:  CT chest 03/30/2020 FINDINGS: Normal pharyngeal phase of swallowing. Primary peristaltic waves in the esophagus were normal on 03/04 swallows. Small type 1 hiatal hernia as shown on image 21/4. There is a widely patent distal esophageal mucosal ring shown on image 20/4. Just above this, in the distal esophagus, there is a smoothly marginated focal narrowing/stricture measuring over about 0.4 cm in length. I was only able to distend this up to about 1.0 cm in diameter, but on the other hand a 13 mm barium tablet was able to pass through this region without difficulty, and pass into the stomach. There is no significant irregularity along this narrowing. Right-sided Port-A-Cath noted.  Atherosclerotic aortic arch. IMPRESSION: 1. Small type 1 hiatal hernia. 2. Smoothly marginated focal narrowing/stricture in the distal esophagus just above the level of the Schatzki ring, only able to distend up to about 1.0 cm in diameter. However, a 13 mm barium tablet passed through this narrowing without impaction. 3. Widely patent distal esophageal mucosal ring. 4. Right-sided Port-A-Cath noted. 5.  Aortic Atherosclerosis (ICD10-I70.0). Electronically  Signed   By: Van Clines M.D.   On: 04/23/2020 09:56    ASSESSMENT & PLAN ESMERALDA MALAY 79 y.o. 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  On exam today Elizabeth Mcintyre is still having fatigue but is otherwise stable from prior.  She tolerated the IV iron treatment the other day well and will be returning next week for her second dose.  She has no barriers to continuing treatment today.  Her platelets are currently over 100 and her hemoglobin is steady over 10.  Her findings prior findings were most consistent with fatigue and weakness secondary to iron deficiency anemia. The etiology of her iron deficiency anemia is the severe hematemesis that she had associated with her esophagitis. At this time I would agree with continued GI management of the esophagitis and we will proceed with IV iron supplementation. The plan will be for IV Feraheme 510 mg q. 7 days x 2 doses. The first dose was administered yesterday.   For her current diagonsis I recommend a rituximab based regimen, with a preference for Bendamustine/Rituximab. This regimen would consist of bendamustine 90 mg/m2 IV once per day on days 1 & 2 and rituximab 375 mg/m2 IV once on day 1.  The cycles are 28 days and the plan would be for 8 cycles. If this was not tolerated, could consider chlorambucil + rituximab vs. rituximab monotherapy.   GELF Criteria: 1 (splenomegaly). Indication for treatment  # Low Grade Marginal Zone Lymphoma, Stage III --plan to proceed with bendamustine + rituximab (regimen noted above). If patient is unable to tolerate this treatment can consider rituximab monotherapy instead. Started therapy with Cycle 1 Day 1 on 03/28/2020 --OK to proceed with Cycle 2 Day 1 of chemotherapy today.  --patient completed excisional lymph node biopsy to confirm the diagnosis. Pathological exam of the lymph node confirms marginal zone lymphoma.  --PET CT  scan confirms stage III  disease. Patient meets GELF criteria for treatment (splenomegaly). She fortunately does not have any B symptoms and her counts are stable at this time, with some mild thrombocytopenia.  --patient completed port placement and chemotherapy education. --RTC for 2 week for interval visits with 4 week treatment and repeat clinic visit.   #Concern for GI Bleed #Hematemesis, resolved --patient admitted on 03/30/2020 (day after chemotherapy) with hematemesis --no overt GI bleeding noted on Upper GI evaluation --Hgb rebounding, patient back to baseline --recommend weekly labs between now and next treatment visit. Continue to monitor Hgb.  --iron levels appear low and patient is having symptoms of iron deficiency anemia including fatigue, restless leg, and dyspnea on exertion.  --scheduled patient for IV feraheme this week.   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   No orders of the defined types were placed in this encounter.   All questions were answered. The patient knows to call the clinic with any problems, questions or concerns.  A total of more than 30 minutes were spent on this encounter and over half of that time was spent on counseling and coordination of care as outlined above.   Ledell Peoples, MD Department of Hematology/Oncology [place] at [facility] Phone: [phone] Pager: [phone] Email: [email or web address]  04/26/2020 8:52 AM   Literature Support:  Artist Pais der Angeline Slim BS, Lyna Poser, Hertzberg M, Kwan YL, Fike D, Craig M, Kolibaba K, Issa S, [place], Chester DM, Munteanu M, Aram Beecham JM. Randomized trial of bendamustine-rituximab or R-CHOP/R-CVP in  first-line treatment of indolent NHL or MCL: the BRIGHT study. Blood. 2014 May 8;123(19):2944-52.  --The overall response rates for BR and R-CHOP/R-CVP were 97% and 91%, respectively (P = .[id number]). These data indicate BR is noninferior to standard therapy with regard to clinical response with an acceptable safety profile.

## 2020-04-25 NOTE — Progress Notes (Signed)
Iron infusion given per orders. Patient tolerated it well without problems. Vitals stable and discharged home from clinic ambulatory. Follow up as scheduled.  

## 2020-04-26 ENCOUNTER — Encounter: Payer: Self-pay | Admitting: Hematology and Oncology

## 2020-04-26 ENCOUNTER — Inpatient Hospital Stay: Payer: Medicare Other

## 2020-04-26 ENCOUNTER — Other Ambulatory Visit: Payer: Self-pay

## 2020-04-26 ENCOUNTER — Inpatient Hospital Stay (HOSPITAL_BASED_OUTPATIENT_CLINIC_OR_DEPARTMENT_OTHER): Payer: Medicare Other | Admitting: Hematology and Oncology

## 2020-04-26 VITALS — BP 143/71 | HR 91 | Temp 96.8°F | Resp 18 | Ht 62.0 in | Wt 130.6 lb

## 2020-04-26 VITALS — BP 122/56 | HR 71 | Resp 16

## 2020-04-26 DIAGNOSIS — C8580 Other specified types of non-Hodgkin lymphoma, unspecified site: Secondary | ICD-10-CM

## 2020-04-26 DIAGNOSIS — Z5112 Encounter for antineoplastic immunotherapy: Secondary | ICD-10-CM | POA: Diagnosis not present

## 2020-04-26 DIAGNOSIS — K920 Hematemesis: Secondary | ICD-10-CM | POA: Diagnosis not present

## 2020-04-26 DIAGNOSIS — E876 Hypokalemia: Secondary | ICD-10-CM

## 2020-04-26 DIAGNOSIS — D500 Iron deficiency anemia secondary to blood loss (chronic): Secondary | ICD-10-CM

## 2020-04-26 DIAGNOSIS — C8514 Unspecified B-cell lymphoma, lymph nodes of axilla and upper limb: Secondary | ICD-10-CM | POA: Diagnosis not present

## 2020-04-26 DIAGNOSIS — Z5111 Encounter for antineoplastic chemotherapy: Secondary | ICD-10-CM | POA: Diagnosis not present

## 2020-04-26 DIAGNOSIS — Z95828 Presence of other vascular implants and grafts: Secondary | ICD-10-CM

## 2020-04-26 LAB — CBC WITH DIFFERENTIAL (CANCER CENTER ONLY)
Abs Immature Granulocytes: 0.02 10*3/uL (ref 0.00–0.07)
Basophils Absolute: 0 10*3/uL (ref 0.0–0.1)
Basophils Relative: 1 %
Eosinophils Absolute: 0.4 10*3/uL (ref 0.0–0.5)
Eosinophils Relative: 10 %
HCT: 29.8 % — ABNORMAL LOW (ref 36.0–46.0)
Hemoglobin: 10.3 g/dL — ABNORMAL LOW (ref 12.0–15.0)
Immature Granulocytes: 1 %
Lymphocytes Relative: 12 %
Lymphs Abs: 0.5 10*3/uL — ABNORMAL LOW (ref 0.7–4.0)
MCH: 27.7 pg (ref 26.0–34.0)
MCHC: 34.6 g/dL (ref 30.0–36.0)
MCV: 80.1 fL (ref 80.0–100.0)
Monocytes Absolute: 0.5 10*3/uL (ref 0.1–1.0)
Monocytes Relative: 12 %
Neutro Abs: 2.5 10*3/uL (ref 1.7–7.7)
Neutrophils Relative %: 64 %
Platelet Count: 106 10*3/uL — ABNORMAL LOW (ref 150–400)
RBC: 3.72 MIL/uL — ABNORMAL LOW (ref 3.87–5.11)
RDW: 15.9 % — ABNORMAL HIGH (ref 11.5–15.5)
WBC Count: 3.9 10*3/uL — ABNORMAL LOW (ref 4.0–10.5)
nRBC: 0 % (ref 0.0–0.2)

## 2020-04-26 LAB — CMP (CANCER CENTER ONLY)
ALT: 8 U/L (ref 0–44)
AST: 15 U/L (ref 15–41)
Albumin: 3.4 g/dL — ABNORMAL LOW (ref 3.5–5.0)
Alkaline Phosphatase: 58 U/L (ref 38–126)
Anion gap: 14 (ref 5–15)
BUN: 11 mg/dL (ref 8–23)
CO2: 19 mmol/L — ABNORMAL LOW (ref 22–32)
Calcium: 9.7 mg/dL (ref 8.9–10.3)
Chloride: 103 mmol/L (ref 98–111)
Creatinine: 1.38 mg/dL — ABNORMAL HIGH (ref 0.44–1.00)
GFR, Est AFR Am: 43 mL/min — ABNORMAL LOW (ref >60)
GFR, Estimated: 37 mL/min — ABNORMAL LOW (ref >60)
Glucose, Bld: 206 mg/dL — ABNORMAL HIGH (ref 70–99)
Potassium: 3 mmol/L — CL (ref 3.5–5.1)
Sodium: 136 mmol/L (ref 135–145)
Total Bilirubin: 0.5 mg/dL (ref 0.3–1.2)
Total Protein: 6.9 g/dL (ref 6.5–8.1)

## 2020-04-26 LAB — LACTATE DEHYDROGENASE: LDH: 199 U/L — ABNORMAL HIGH (ref 98–192)

## 2020-04-26 MED ORDER — SODIUM CHLORIDE 0.9 % IV SOLN
Freq: Once | INTRAVENOUS | Status: AC
  Filled 2020-04-26: qty 250

## 2020-04-26 MED ORDER — POTASSIUM CHLORIDE 10 MEQ/100ML IV SOLN
10.0000 meq | INTRAVENOUS | Status: AC
  Administered 2020-04-26 (×2): 10 meq via INTRAVENOUS

## 2020-04-26 MED ORDER — SODIUM CHLORIDE 0.9 % IV SOLN
90.0000 mg/m2 | Freq: Once | INTRAVENOUS | Status: AC
  Administered 2020-04-26: 150 mg via INTRAVENOUS
  Filled 2020-04-26: qty 6

## 2020-04-26 MED ORDER — SODIUM CHLORIDE 0.9% FLUSH
10.0000 mL | Freq: Once | INTRAVENOUS | Status: AC | PRN
  Administered 2020-04-26: 10 mL
  Filled 2020-04-26: qty 10

## 2020-04-26 MED ORDER — SODIUM CHLORIDE 0.9 % IV SOLN
10.0000 mg | Freq: Once | INTRAVENOUS | Status: AC
  Administered 2020-04-26: 10 mg via INTRAVENOUS
  Filled 2020-04-26: qty 10

## 2020-04-26 MED ORDER — POTASSIUM CHLORIDE CRYS ER 20 MEQ PO TBCR
20.0000 meq | EXTENDED_RELEASE_TABLET | Freq: Every day | ORAL | 0 refills | Status: DC
Start: 2020-04-26 — End: 2020-05-17

## 2020-04-26 MED ORDER — LORATADINE 10 MG PO TABS
10.0000 mg | ORAL_TABLET | Freq: Once | ORAL | Status: AC
  Administered 2020-04-26: 10 mg via ORAL

## 2020-04-26 MED ORDER — ACETAMINOPHEN 325 MG PO TABS
650.0000 mg | ORAL_TABLET | Freq: Once | ORAL | Status: AC
  Administered 2020-04-26: 650 mg via ORAL

## 2020-04-26 MED ORDER — SODIUM CHLORIDE 0.9% FLUSH
10.0000 mL | INTRAVENOUS | Status: DC | PRN
  Administered 2020-04-26: 10 mL
  Filled 2020-04-26: qty 10

## 2020-04-26 MED ORDER — POTASSIUM CHLORIDE 10 MEQ/100ML IV SOLN: 10.0000 meq | INTRAVENOUS | Status: DC

## 2020-04-26 MED ORDER — PALONOSETRON HCL INJECTION 0.25 MG/5ML
0.2500 mg | Freq: Once | INTRAVENOUS | Status: AC
  Administered 2020-04-26: 0.25 mg via INTRAVENOUS

## 2020-04-26 MED ORDER — HEPARIN SOD (PORK) LOCK FLUSH 100 UNIT/ML IV SOLN
500.0000 [IU] | Freq: Once | INTRAVENOUS | Status: AC | PRN
  Administered 2020-04-26: 500 [IU]
  Filled 2020-04-26: qty 5

## 2020-04-26 MED ORDER — SODIUM CHLORIDE 0.9 % IV SOLN
375.0000 mg/m2 | Freq: Once | INTRAVENOUS | Status: AC
  Administered 2020-04-26: 600 mg via INTRAVENOUS
  Filled 2020-04-26: qty 50

## 2020-04-26 NOTE — Progress Notes (Signed)
Per Dr. Lorenso Courier: OK to treat with potassium 3.0. Plan to give 20 mEq IV today if schedule allows, and prescription sent for oral replacement as well. Patient aware of plan and verbalized understanding/agreement.

## 2020-04-26 NOTE — Patient Instructions (Signed)
**  Please pick up your new potassium prescription from your pharmacy and start taking tomorrow**  [facility] Discharge Instructions for Patients Receiving Chemotherapy  Today you received the following chemotherapy agents: Rituximab-pvvr (Ruxience) and Bendamustine Verl Dicker)  To help prevent nausea and vomiting after your treatment, we encourage you to take your nausea medication as directed by your provider.  If you develop nausea and vomiting that is not controlled by your nausea medication, call the clinic.   BELOW ARE SYMPTOMS THAT SHOULD BE REPORTED IMMEDIATELY:  *FEVER GREATER THAN 100.5 F  *CHILLS WITH OR WITHOUT FEVER  NAUSEA AND VOMITING THAT IS NOT CONTROLLED WITH YOUR NAUSEA MEDICATION  *UNUSUAL SHORTNESS OF BREATH  *UNUSUAL BRUISING OR BLEEDING  TENDERNESS IN MOUTH AND THROAT WITH OR WITHOUT PRESENCE OF ULCERS  *URINARY PROBLEMS  *BOWEL PROBLEMS  UNUSUAL RASH Items with * indicate a potential emergency and should be followed up as soon as possible.  Feel free to call the clinic should you have any questions or concerns. The clinic phone number is [phone].  Please show the [place] at check-in to the Emergency Department and triage nurse.

## 2020-04-27 ENCOUNTER — Other Ambulatory Visit: Payer: Self-pay

## 2020-04-27 ENCOUNTER — Inpatient Hospital Stay: Payer: Medicare Other

## 2020-04-27 VITALS — BP 154/58 | HR 91 | Temp 97.8°F | Resp 18

## 2020-04-27 DIAGNOSIS — C8580 Other specified types of non-Hodgkin lymphoma, unspecified site: Secondary | ICD-10-CM

## 2020-04-27 MED ORDER — HEPARIN SOD (PORK) LOCK FLUSH 100 UNIT/ML IV SOLN
500.0000 [IU] | Freq: Once | INTRAVENOUS | Status: AC | PRN
  Administered 2020-04-27: 500 [IU]
  Filled 2020-04-27: qty 5

## 2020-04-27 MED ORDER — SODIUM CHLORIDE 0.9 % IV SOLN
Freq: Once | INTRAVENOUS | Status: AC
  Filled 2020-04-27: qty 250

## 2020-04-27 MED ORDER — SODIUM CHLORIDE 0.9 % IV SOLN
90.0000 mg/m2 | Freq: Once | INTRAVENOUS | Status: AC
  Administered 2020-04-27: 150 mg via INTRAVENOUS
  Filled 2020-04-27: qty 6

## 2020-04-27 MED ORDER — SODIUM CHLORIDE 0.9% FLUSH
10.0000 mL | INTRAVENOUS | Status: DC | PRN
  Administered 2020-04-27: 10 mL
  Filled 2020-04-27: qty 10

## 2020-04-27 MED ORDER — SODIUM CHLORIDE 0.9 % IV SOLN
10.0000 mg | Freq: Once | INTRAVENOUS | Status: AC
  Administered 2020-04-27: 10 mg via INTRAVENOUS
  Filled 2020-04-27: qty 10

## 2020-04-27 NOTE — Patient Instructions (Signed)
[facility] Discharge Instructions for Patients Receiving Chemotherapy  Today you received the following chemotherapy agents Bendamustine (BENDEKA).  To help prevent nausea and vomiting after your treatment, we encourage you to take your nausea medication as prescribed.   If you develop nausea and vomiting that is not controlled by your nausea medication, call the clinic.   BELOW ARE SYMPTOMS THAT SHOULD BE REPORTED IMMEDIATELY:  *FEVER GREATER THAN 100.5 F  *CHILLS WITH OR WITHOUT FEVER  NAUSEA AND VOMITING THAT IS NOT CONTROLLED WITH YOUR NAUSEA MEDICATION  *UNUSUAL SHORTNESS OF BREATH  *UNUSUAL BRUISING OR BLEEDING  TENDERNESS IN MOUTH AND THROAT WITH OR WITHOUT PRESENCE OF ULCERS  *URINARY PROBLEMS  *BOWEL PROBLEMS  UNUSUAL RASH Items with * indicate a potential emergency and should be followed up as soon as possible.  Feel free to call the clinic should you have any questions or concerns. The clinic phone number is [phone].  Please show the CHEMO ALERT CARD at check-in to the Emergency Department and triage nurse.   

## 2020-04-30 ENCOUNTER — Telehealth: Payer: Self-pay | Admitting: *Deleted

## 2020-04-30 ENCOUNTER — Other Ambulatory Visit: Payer: Self-pay

## 2020-04-30 ENCOUNTER — Inpatient Hospital Stay (HOSPITAL_COMMUNITY)
Admission: EM | Admit: 2020-04-30 | Discharge: 2020-05-05 | DRG: 393 | Disposition: A | Discharge status: Home / Self Care | Payer: Medicare Other | Attending: Internal Medicine | Admitting: Internal Medicine

## 2020-04-30 ENCOUNTER — Encounter (HOSPITAL_COMMUNITY): Payer: Self-pay | Admitting: *Deleted

## 2020-04-30 ENCOUNTER — Emergency Department (HOSPITAL_COMMUNITY): Payer: Medicare Other

## 2020-04-30 DIAGNOSIS — Z79890 Hormone replacement therapy (postmenopausal): Secondary | ICD-10-CM

## 2020-04-30 DIAGNOSIS — I129 Hypertensive chronic kidney disease with stage 1 through stage 4 chronic kidney disease, or unspecified chronic kidney disease: Secondary | ICD-10-CM | POA: Diagnosis not present

## 2020-04-30 DIAGNOSIS — M069 Rheumatoid arthritis, unspecified: Secondary | ICD-10-CM | POA: Diagnosis not present

## 2020-04-30 DIAGNOSIS — K559 Vascular disorder of intestine, unspecified: Secondary | ICD-10-CM | POA: Diagnosis not present

## 2020-04-30 DIAGNOSIS — R5081 Fever presenting with conditions classified elsewhere: Secondary | ICD-10-CM | POA: Diagnosis not present

## 2020-04-30 DIAGNOSIS — J45909 Unspecified asthma, uncomplicated: Secondary | ICD-10-CM | POA: Diagnosis present

## 2020-04-30 DIAGNOSIS — C8580 Other specified types of non-Hodgkin lymphoma, unspecified site: Secondary | ICD-10-CM | POA: Diagnosis not present

## 2020-04-30 DIAGNOSIS — Z20822 Contact with and (suspected) exposure to covid-19: Secondary | ICD-10-CM | POA: Diagnosis present

## 2020-04-30 DIAGNOSIS — C8594 Non-Hodgkin lymphoma, unspecified, lymph nodes of axilla and upper limb: Secondary | ICD-10-CM | POA: Diagnosis not present

## 2020-04-30 DIAGNOSIS — R58 Hemorrhage, not elsewhere classified: Secondary | ICD-10-CM | POA: Diagnosis not present

## 2020-04-30 DIAGNOSIS — Z8673 Personal history of transient ischemic attack (TIA), and cerebral infarction without residual deficits: Secondary | ICD-10-CM

## 2020-04-30 DIAGNOSIS — K589 Irritable bowel syndrome without diarrhea: Secondary | ICD-10-CM | POA: Diagnosis present

## 2020-04-30 DIAGNOSIS — N183 Chronic kidney disease, stage 3 unspecified: Secondary | ICD-10-CM | POA: Diagnosis not present

## 2020-04-30 DIAGNOSIS — T451X5A Adverse effect of antineoplastic and immunosuppressive drugs, initial encounter: Secondary | ICD-10-CM | POA: Diagnosis present

## 2020-04-30 DIAGNOSIS — K633 Ulcer of intestine: Secondary | ICD-10-CM | POA: Diagnosis present

## 2020-04-30 DIAGNOSIS — R000 Tachycardia, unspecified: Secondary | ICD-10-CM | POA: Diagnosis not present

## 2020-04-30 DIAGNOSIS — E871 Hypo-osmolality and hyponatremia: Secondary | ICD-10-CM | POA: Diagnosis present

## 2020-04-30 DIAGNOSIS — K5730 Diverticulosis of large intestine without perforation or abscess without bleeding: Secondary | ICD-10-CM | POA: Diagnosis present

## 2020-04-30 DIAGNOSIS — Z9221 Personal history of antineoplastic chemotherapy: Secondary | ICD-10-CM

## 2020-04-30 DIAGNOSIS — K625 Hemorrhage of anus and rectum: Secondary | ICD-10-CM | POA: Diagnosis not present

## 2020-04-30 DIAGNOSIS — K635 Polyp of colon: Secondary | ICD-10-CM | POA: Diagnosis not present

## 2020-04-30 DIAGNOSIS — I700 Atherosclerosis of aorta: Secondary | ICD-10-CM | POA: Diagnosis not present

## 2020-04-30 DIAGNOSIS — D62 Acute posthemorrhagic anemia: Secondary | ICD-10-CM | POA: Diagnosis not present

## 2020-04-30 DIAGNOSIS — K922 Gastrointestinal hemorrhage, unspecified: Secondary | ICD-10-CM | POA: Diagnosis not present

## 2020-04-30 DIAGNOSIS — Z833 Family history of diabetes mellitus: Secondary | ICD-10-CM | POA: Diagnosis not present

## 2020-04-30 DIAGNOSIS — K2100 Gastro-esophageal reflux disease with esophagitis, without bleeding: Secondary | ICD-10-CM | POA: Diagnosis present

## 2020-04-30 DIAGNOSIS — R0902 Hypoxemia: Secondary | ICD-10-CM | POA: Diagnosis not present

## 2020-04-30 DIAGNOSIS — I10 Essential (primary) hypertension: Secondary | ICD-10-CM | POA: Diagnosis present

## 2020-04-30 DIAGNOSIS — D122 Benign neoplasm of ascending colon: Secondary | ICD-10-CM | POA: Diagnosis not present

## 2020-04-30 DIAGNOSIS — Z79899 Other long term (current) drug therapy: Secondary | ICD-10-CM

## 2020-04-30 DIAGNOSIS — E876 Hypokalemia: Secondary | ICD-10-CM | POA: Diagnosis present

## 2020-04-30 DIAGNOSIS — I701 Atherosclerosis of renal artery: Secondary | ICD-10-CM | POA: Diagnosis not present

## 2020-04-30 DIAGNOSIS — K648 Other hemorrhoids: Secondary | ICD-10-CM | POA: Diagnosis present

## 2020-04-30 DIAGNOSIS — Z8249 Family history of ischemic heart disease and other diseases of the circulatory system: Secondary | ICD-10-CM

## 2020-04-30 DIAGNOSIS — N1831 Chronic kidney disease, stage 3a: Secondary | ICD-10-CM | POA: Diagnosis not present

## 2020-04-30 DIAGNOSIS — D649 Anemia, unspecified: Secondary | ICD-10-CM | POA: Diagnosis not present

## 2020-04-30 DIAGNOSIS — D61810 Antineoplastic chemotherapy induced pancytopenia: Secondary | ICD-10-CM | POA: Diagnosis present

## 2020-04-30 DIAGNOSIS — K55031 Focal (segmental) acute (reversible) ischemia of large intestine: Secondary | ICD-10-CM | POA: Diagnosis not present

## 2020-04-30 DIAGNOSIS — E1122 Type 2 diabetes mellitus with diabetic chronic kidney disease: Secondary | ICD-10-CM | POA: Diagnosis not present

## 2020-04-30 DIAGNOSIS — K921 Melena: Secondary | ICD-10-CM | POA: Diagnosis not present

## 2020-04-30 DIAGNOSIS — R52 Pain, unspecified: Secondary | ICD-10-CM | POA: Diagnosis not present

## 2020-04-30 DIAGNOSIS — Z743 Need for continuous supervision: Secondary | ICD-10-CM | POA: Diagnosis not present

## 2020-04-30 DIAGNOSIS — I499 Cardiac arrhythmia, unspecified: Secondary | ICD-10-CM | POA: Diagnosis not present

## 2020-04-30 DIAGNOSIS — D709 Neutropenia, unspecified: Secondary | ICD-10-CM | POA: Diagnosis present

## 2020-04-30 DIAGNOSIS — E039 Hypothyroidism, unspecified: Secondary | ICD-10-CM | POA: Diagnosis present

## 2020-04-30 HISTORY — DX: Gastrointestinal hemorrhage, unspecified: K92.2

## 2020-04-30 LAB — RETICULOCYTES
Immature Retic Fract: 4.7 % (ref 2.3–15.9)
RBC.: 3.83 MIL/uL — ABNORMAL LOW (ref 3.87–5.11)
Retic Count, Absolute: 94.2 10*3/uL (ref 19.0–186.0)
Retic Ct Pct: 2.5 % (ref 0.4–3.1)

## 2020-04-30 LAB — URINALYSIS, ROUTINE W REFLEX MICROSCOPIC
Bilirubin Urine: NEGATIVE
Glucose, UA: NEGATIVE mg/dL
Ketones, ur: NEGATIVE mg/dL
Leukocytes,Ua: NEGATIVE
Nitrite: NEGATIVE
Protein, ur: NEGATIVE mg/dL
Specific Gravity, Urine: 1.012 (ref 1.005–1.030)
pH: 6 (ref 5.0–8.0)

## 2020-04-30 LAB — COMPREHENSIVE METABOLIC PANEL
ALT: 13 U/L (ref 0–44)
AST: 19 U/L (ref 15–41)
Albumin: 3.8 g/dL (ref 3.5–5.0)
Alkaline Phosphatase: 50 U/L (ref 38–126)
Anion gap: 12 (ref 5–15)
BUN: 21 mg/dL (ref 8–23)
CO2: 20 mmol/L — ABNORMAL LOW (ref 22–32)
Calcium: 9 mg/dL (ref 8.9–10.3)
Chloride: 101 mmol/L (ref 98–111)
Creatinine, Ser: 1.45 mg/dL — ABNORMAL HIGH (ref 0.44–1.00)
GFR calc Af Amer: 40 mL/min — ABNORMAL LOW (ref >60)
GFR calc non Af Amer: 35 mL/min — ABNORMAL LOW (ref >60)
Glucose, Bld: 160 mg/dL — ABNORMAL HIGH (ref 70–99)
Potassium: 3.7 mmol/L (ref 3.5–5.1)
Sodium: 133 mmol/L — ABNORMAL LOW (ref 135–145)
Total Bilirubin: 0.7 mg/dL (ref 0.3–1.2)
Total Protein: 6.8 g/dL (ref 6.5–8.1)

## 2020-04-30 LAB — FOLATE: Folate: 6.4 ng/mL (ref >5.9)

## 2020-04-30 LAB — PROTIME-INR
INR: 1 (ref 0.8–1.2)
Prothrombin Time: 13 s (ref 11.4–15.2)

## 2020-04-30 LAB — CBC WITH DIFFERENTIAL/PLATELET
Abs Immature Granulocytes: 0.03 10*3/uL (ref 0.00–0.07)
Basophils Absolute: 0 10*3/uL (ref 0.0–0.1)
Basophils Relative: 0 %
Eosinophils Absolute: 0.1 10*3/uL (ref 0.0–0.5)
Eosinophils Relative: 2 %
HCT: 31.7 % — ABNORMAL LOW (ref 36.0–46.0)
Hemoglobin: 10.6 g/dL — ABNORMAL LOW (ref 12.0–15.0)
Immature Granulocytes: 1 %
Lymphocytes Relative: 1 %
Lymphs Abs: 0.1 10*3/uL — ABNORMAL LOW (ref 0.7–4.0)
MCH: 27.8 pg (ref 26.0–34.0)
MCHC: 33.4 g/dL (ref 30.0–36.0)
MCV: 83.2 fL (ref 80.0–100.0)
Monocytes Absolute: 0.2 10*3/uL (ref 0.1–1.0)
Monocytes Relative: 4 %
Neutro Abs: 4 10*3/uL (ref 1.7–7.7)
Neutrophils Relative %: 92 %
Platelets: 115 10*3/uL — ABNORMAL LOW (ref 150–400)
RBC: 3.81 MIL/uL — ABNORMAL LOW (ref 3.87–5.11)
RDW: 16.5 % — ABNORMAL HIGH (ref 11.5–15.5)
WBC: 4.4 10*3/uL (ref 4.0–10.5)
nRBC: 0 % (ref 0.0–0.2)

## 2020-04-30 LAB — VITAMIN B12: Vitamin B-12: 940 pg/mL — ABNORMAL HIGH (ref 180–914)

## 2020-04-30 LAB — HEMOGLOBIN AND HEMATOCRIT, BLOOD
HCT: 26.6 % — ABNORMAL LOW (ref 36.0–46.0)
Hemoglobin: 9 g/dL — ABNORMAL LOW (ref 12.0–15.0)

## 2020-04-30 LAB — POC OCCULT BLOOD, ED: Fecal Occult Bld: POSITIVE — AB

## 2020-04-30 LAB — TYPE AND SCREEN
ABO/RH(D): A POS
Antibody Screen: NEGATIVE

## 2020-04-30 LAB — LACTIC ACID, PLASMA
Lactic Acid, Venous: 2.3 mmol/L (ref 0.5–1.9)
Lactic Acid, Venous: 2.3 mmol/L (ref 0.5–1.9)

## 2020-04-30 LAB — IRON AND TIBC
Iron: 34 ug/dL (ref 28–170)
Saturation Ratios: 9 % — ABNORMAL LOW (ref 10.4–31.8)
TIBC: 380 ug/dL (ref 250–450)
UIBC: 346 ug/dL

## 2020-04-30 LAB — SARS CORONAVIRUS 2 BY RT PCR (HOSPITAL ORDER, PERFORMED IN ~~LOC~~ HOSPITAL LAB): SARS Coronavirus 2: NEGATIVE

## 2020-04-30 LAB — FERRITIN: Ferritin: 1053 ng/mL — ABNORMAL HIGH (ref 11–307)

## 2020-04-30 LAB — APTT: aPTT: 26 s (ref 24–36)

## 2020-04-30 IMAGING — CT CT ABD-PELV W/O CM
2 of 4 series · 15 of 46 positions shown, 17 images · non-contrast
Comparison: [DATE]

CLINICAL DATA: Rectal bleeding and bloody stool.

EXAM:
CT ABDOMEN AND PELVIS WITHOUT CONTRAST
TECHNIQUE: Multidetector CT imaging of the abdomen and pelvis was performed
following the standard protocol without IV contrast.

[Series 2: axial st · axial · 0.65mm/px · z∈[+843,+1278]mm · 12 of 99 slices shown, 14 images]
[im 6/99  soft-tissue]
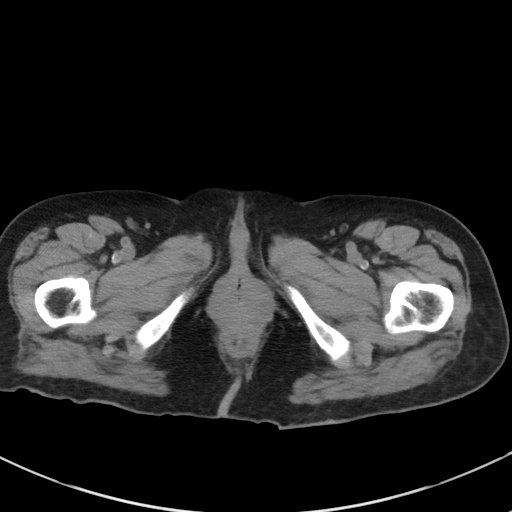
[im 6/99  bone]
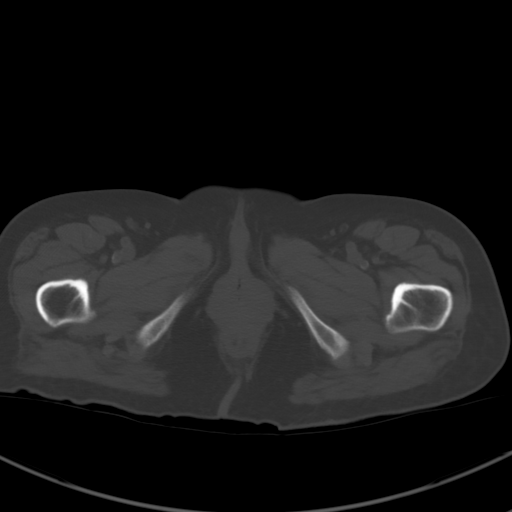
[im 16/99  soft-tissue]
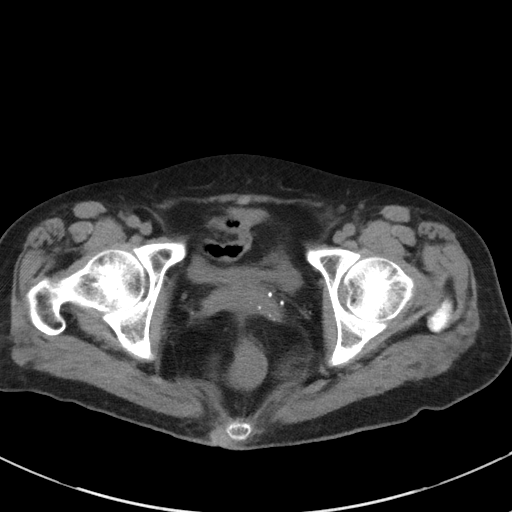
[im 21/99  soft-tissue]
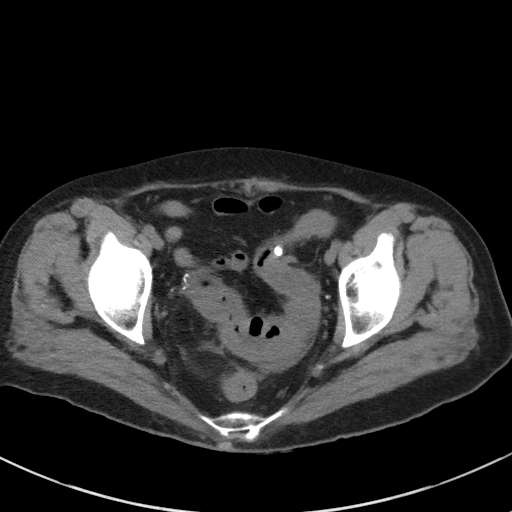
[im 31/99  soft-tissue]
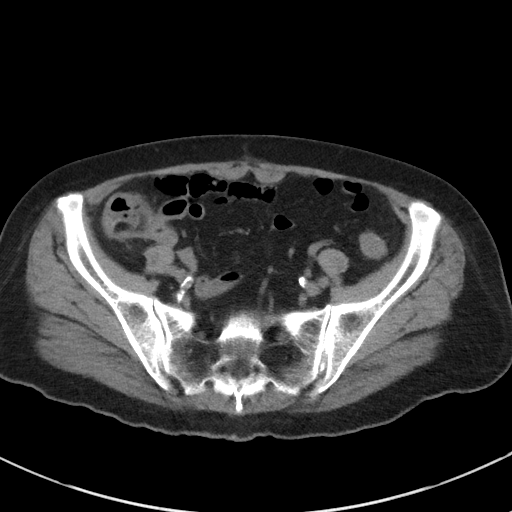
[im 37/99  soft-tissue]
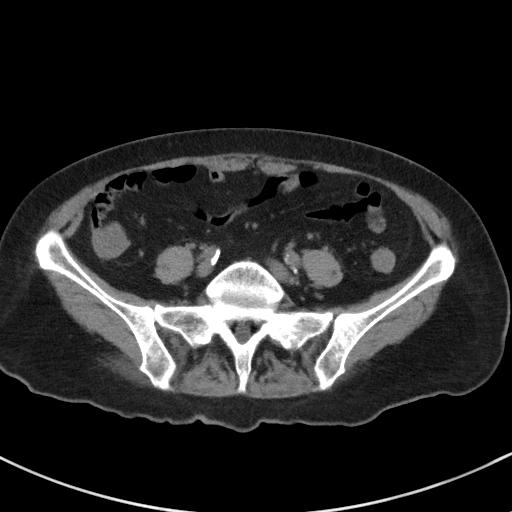
[im 47/99  soft-tissue]
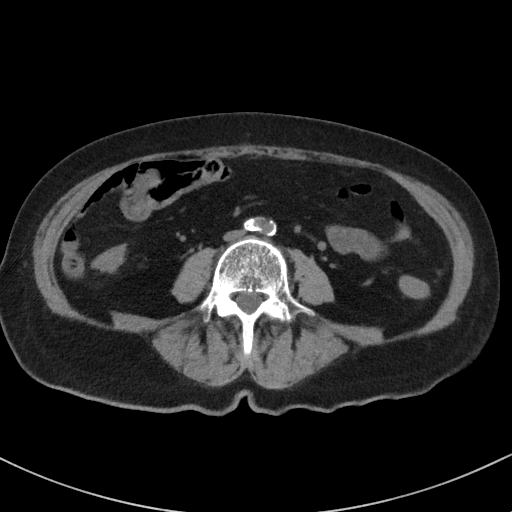
[im 52/99  soft-tissue]
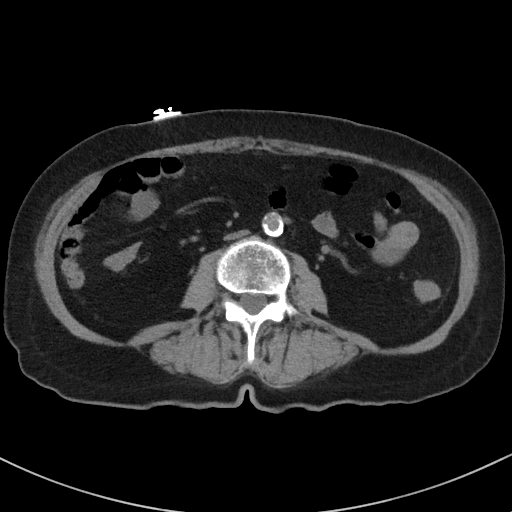
[im 62/99  soft-tissue]
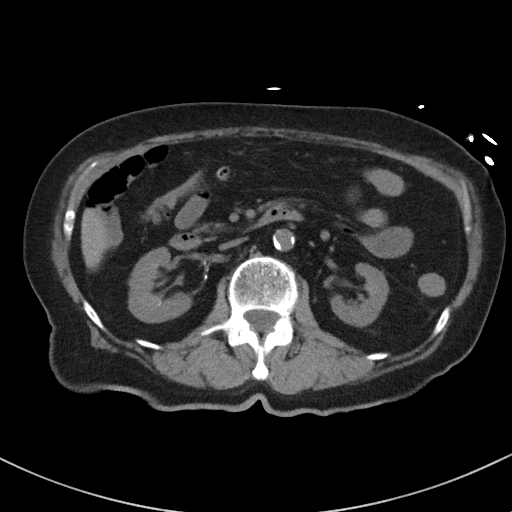
[im 68/99  soft-tissue]
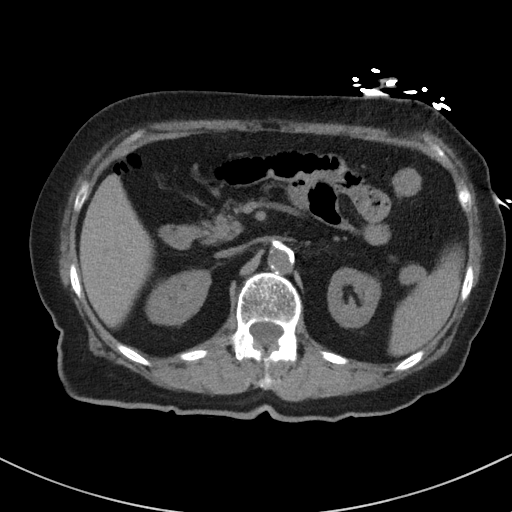
[im 68/99  bone]
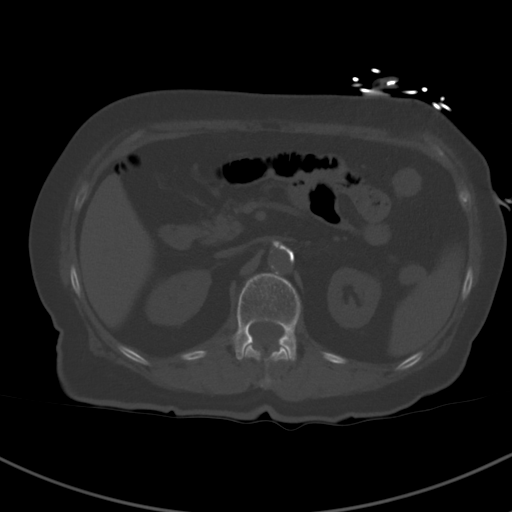
[im 78/99  soft-tissue]
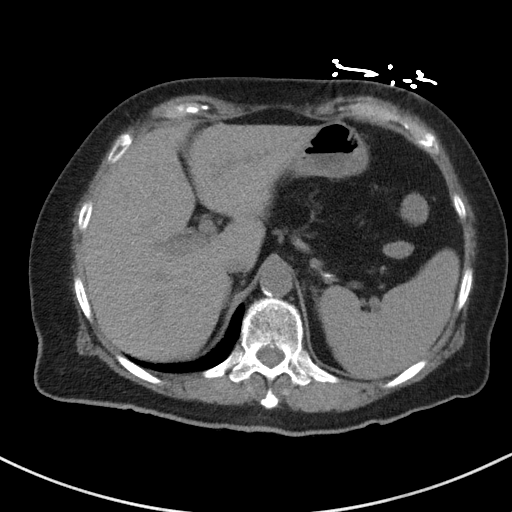
[im 83/99  soft-tissue]
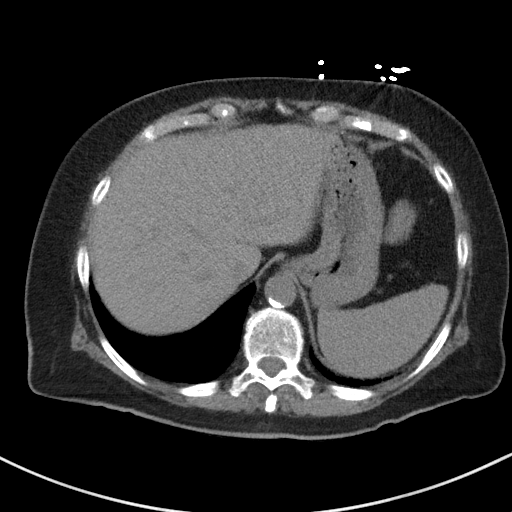
[im 93/99  soft-tissue]
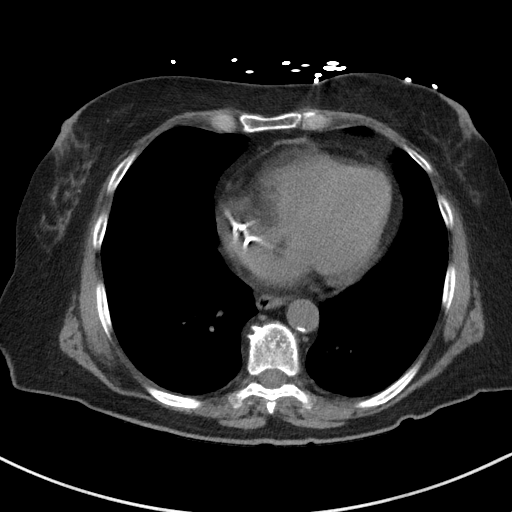

[Series 5: coronal st · coronal · 0.67mm/px · 3 of 76 slices shown]
[im 26/76  soft-tissue]
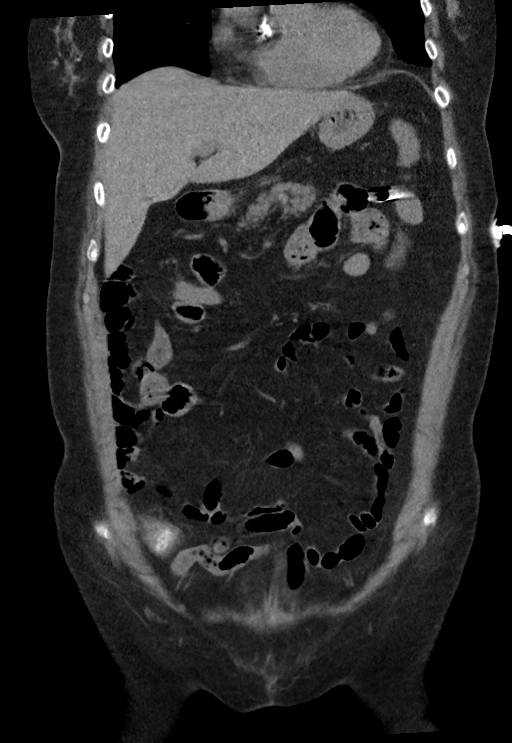
[im 34/76  soft-tissue]
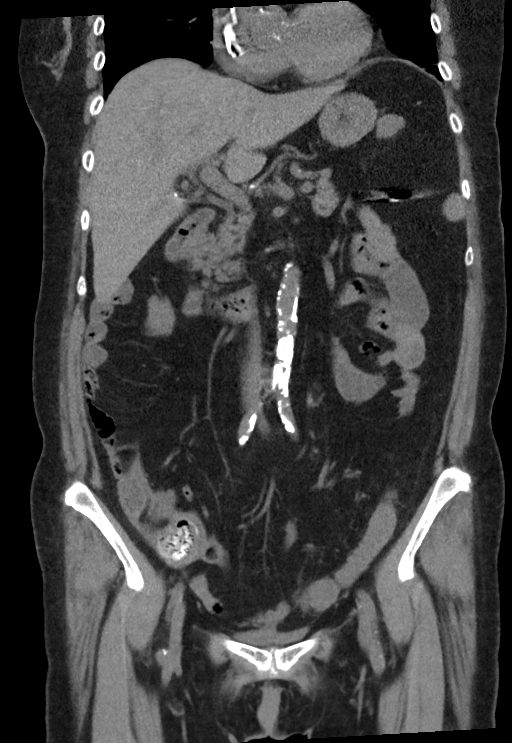
[im 42/76  soft-tissue]
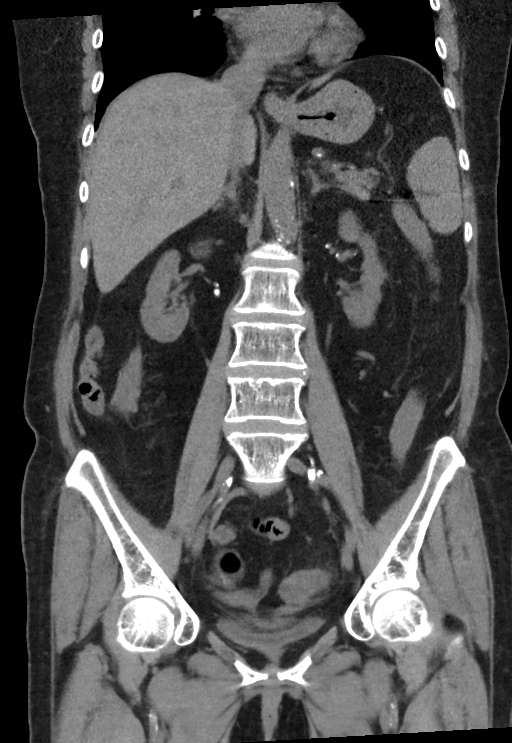

[15 of 46 positions shown; findings below may reference images not displayed]

FINDINGS: Lower chest: No acute abnormality.

Hepatobiliary: No focal liver abnormality is seen. Status post
cholecystectomy. No biliary dilatation.

Pancreas: Unremarkable. No pancreatic ductal dilatation or
surrounding inflammatory changes.

Spleen: Normal in size without focal abnormality.

Adrenals/Urinary Tract: Adrenal glands are unremarkable. Kidneys are
normal in size, without renal calculi or hydronephrosis. A 1.5 cm
diameter exophytic cyst is seen along the lateral aspect of the mid
right kidney. Bladder is unremarkable.

Stomach/Bowel: Stomach is within normal limits. Appendix appears
normal. A small amount of retained barium is seen within the distal
transverse colon. No evidence of bowel dilatation. Marked severity
thickening of the mid sigmoid colon is seen. A mild amount of
pericolonic inflammatory fat stranding is also noted.

Vascular/Lymphatic: There is marked severity calcification of the
abdominal aorta and bilateral common iliac arteries, without
evidence of aneurysmal dilatation. No enlarged abdominal or pelvic
lymph nodes.

Reproductive: Status post hysterectomy. No adnexal masses.

Other: No abdominal wall hernia or abnormality. No abdominopelvic
ascites.

Musculoskeletal: No acute or significant osseous findings.
IMPRESSION: 1. Marked severity thickening of the mid sigmoid colon with a mild
amount of pericolonic inflammatory fat stranding. While this may
represent sequelae associated with an acute infectious or
inflammatory colitis, the presence of an underlying neoplastic
process cannot be excluded. Correlation with colonoscopy is
recommended.
2. Evidence of prior cholecystectomy and hysterectomy.
3. Aortic atherosclerosis.

Aortic Atherosclerosis ([UV]-[UV]).

## 2020-04-30 MED ORDER — DEXTROSE-NACL 5-0.45 % IV SOLN
INTRAVENOUS | Status: DC
Start: 2020-04-30 — End: 2020-04-30

## 2020-04-30 MED ORDER — DEXTROSE-NACL 5-0.45 % IV SOLN: INTRAVENOUS | Status: DC

## 2020-04-30 MED ORDER — VITAMIN B-12 1000 MCG PO TABS: 1000.0000 ug | ORAL_TABLET | Freq: Every day | ORAL | Status: DC

## 2020-04-30 MED ORDER — PANTOPRAZOLE SODIUM 40 MG PO TBEC
40.0000 mg | DELAYED_RELEASE_TABLET | Freq: Two times a day (BID) | ORAL | Status: DC
  Administered 2020-05-01 – 2020-05-05 (×9): 40 mg via ORAL
  Filled 2020-04-30 (×9): qty 1

## 2020-04-30 MED ORDER — ACETAMINOPHEN 325 MG PO TABS
650.0000 mg | ORAL_TABLET | Freq: Four times a day (QID) | ORAL | Status: DC | PRN
  Administered 2020-05-01 – 2020-05-04 (×5): 650 mg via ORAL
  Filled 2020-04-30 (×5): qty 2

## 2020-04-30 MED ORDER — BISACODYL 10 MG RE SUPP: 10.0000 mg | Freq: Every day | RECTAL | Status: DC | PRN

## 2020-04-30 MED ORDER — ALBUTEROL SULFATE HFA 108 (90 BASE) MCG/ACT IN AERS: 2.0000 | INHALATION_SPRAY | Freq: Four times a day (QID) | RESPIRATORY_TRACT | Status: DC | PRN

## 2020-04-30 MED ORDER — VITAMIN B-12 1000 MCG PO TABS
1000.0000 ug | ORAL_TABLET | Freq: Every day | ORAL | Status: DC
  Administered 2020-05-01 – 2020-05-05 (×4): 1000 ug via ORAL
  Filled 2020-04-30 (×4): qty 1

## 2020-04-30 MED ORDER — ONDANSETRON HCL 4 MG/2ML IJ SOLN
4.0000 mg | Freq: Four times a day (QID) | INTRAMUSCULAR | Status: DC | PRN
  Administered 2020-05-04: 4 mg via INTRAVENOUS
  Filled 2020-04-30: qty 2

## 2020-04-30 MED ORDER — SUCRALFATE 1 GM/10ML PO SUSP: 1.0000 g | Freq: Four times a day (QID) | ORAL | Status: DC | PRN

## 2020-04-30 MED ORDER — ROPINIROLE HCL 0.25 MG PO TABS
0.2500 mg | ORAL_TABLET | Freq: Every day | ORAL | Status: DC
  Administered 2020-05-01 – 2020-05-04 (×5): 0.25 mg via ORAL
  Filled 2020-04-30 (×7): qty 1

## 2020-04-30 MED ORDER — LEVOTHYROXINE SODIUM 75 MCG PO TABS
75.0000 ug | ORAL_TABLET | Freq: Every day | ORAL | Status: DC
  Administered 2020-05-01 – 2020-05-05 (×5): 75 ug via ORAL
  Filled 2020-04-30 (×5): qty 1

## 2020-04-30 MED ORDER — DICYCLOMINE HCL 10 MG PO CAPS
10.0000 mg | ORAL_CAPSULE | Freq: Three times a day (TID) | ORAL | Status: DC | PRN
  Administered 2020-05-01: 10 mg via ORAL
  Filled 2020-04-30: qty 1

## 2020-04-30 MED ORDER — VITAMIN D 25 MCG (1000 UNIT) PO TABS
1000.0000 [IU] | ORAL_TABLET | Freq: Every day | ORAL | Status: DC
  Administered 2020-05-01 – 2020-05-05 (×4): 1000 [IU] via ORAL
  Filled 2020-04-30 (×6): qty 1

## 2020-04-30 MED ORDER — ONDANSETRON HCL 4 MG PO TABS: 4.0000 mg | ORAL_TABLET | Freq: Four times a day (QID) | ORAL | Status: DC | PRN

## 2020-04-30 MED ORDER — AMLODIPINE BESYLATE 5 MG PO TABS: 10.0000 mg | ORAL_TABLET | Freq: Every day | ORAL | Status: DC

## 2020-04-30 MED ORDER — SODIUM CHLORIDE 0.9 % IV BOLUS
1000.0000 mL | Freq: Once | INTRAVENOUS | Status: AC
  Administered 2020-04-30: 1000 mL via INTRAVENOUS

## 2020-04-30 MED ORDER — ALLOPURINOL 300 MG PO TABS
300.0000 mg | ORAL_TABLET | Freq: Every day | ORAL | Status: DC
  Administered 2020-05-01 – 2020-05-05 (×4): 300 mg via ORAL
  Filled 2020-04-30 (×6): qty 1

## 2020-04-30 MED ORDER — LEVOTHYROXINE SODIUM 75 MCG PO TABS: 75.0000 ug | ORAL_TABLET | Freq: Every day | ORAL | Status: DC

## 2020-04-30 MED ORDER — POLYETHYLENE GLYCOL 3350 17 G PO PACK: 17.0000 g | PACK | Freq: Every day | ORAL | Status: DC | PRN

## 2020-04-30 MED ORDER — ALBUTEROL SULFATE (2.5 MG/3ML) 0.083% IN NEBU: 2.5000 mg | INHALATION_SOLUTION | Freq: Four times a day (QID) | RESPIRATORY_TRACT | Status: DC | PRN

## 2020-04-30 MED ORDER — AMLODIPINE BESYLATE 5 MG PO TABS
10.0000 mg | ORAL_TABLET | Freq: Every day | ORAL | Status: DC
  Administered 2020-05-01 – 2020-05-05 (×4): 10 mg via ORAL
  Filled 2020-04-30 (×4): qty 2

## 2020-04-30 NOTE — Telephone Encounter (Signed)
Received vm message from pt's daughter, Brendia Sacks. TCT [name] and she tells me that her mother called this morning saying that she was having rectal bleeding.  [name] is on her way over there now. She states if the bleeding is 'real bad', she will take her mom to [facility]. If it seems not urgent she will make the drive to [facility].  Advised that [name] call me with follow up later today.   She states she will.

## 2020-04-30 NOTE — H&P (Addendum)
[facility] History & Physical  [name] MD  Elizabeth Mcintyre 04/30/2020  Chief Complaint: Bloody stools HPI: The patient is a 79 y.o. year-old w/ hx of CVA, HTN, HL, GI bleed, hx RA, DM2, anxiety/ depression, hx lymphoma 2021 currently on chemoRx. Pt presented today for rectal bleeding that started last night. She has had about 4 episodes of pain in the lower abdomen then passing 2 - 3 tbsp's of bright red blood per rectum. Not mixed in with feces. No voiding issues. No diarrhea or fevers. +lightheaded and fatigued than usual.  Had 1st rd of chemo 1 mo ago and required admit afterwards for hematemesis, EGD showed esophagitis. Last chemo infusion was 04/26/20. ED spoke w/ GI on call , they recommend admission and they will see in am, tentatively planning for colonoscopy. Asked to see for admission.   Pt seen in ED, she is stable and comfortable. She started having these episodes yesterday, has had 4-6 prior to coming here and another 4-6 episodes since arriving per the pt's friend.  Episodes consist of bilat LQ abd discomfort followed by passing a few TBSP's of bright red blood.  No fevers, no CP , SOB , no N/V.  Appetite has been poor since starting chemo 5 wks ago.    Pt lives in [place], is widowed.  She has a roommate.  Active.    ROS  denies CP  no joint pain   no HA  no blurry vision  no rash  no diarrhea  no dysuria  no difficulty voiding  no change in urine color   Past Medical History  Past Medical History:  Diagnosis Date  . Asthma   . Bloating 04/26/2019  . Cancer ([place]) 2021   Lymphoma  . 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 DIABETES MELLITUS, TYPE II 11/09/2007   diet control  . Diverticulosis 03/2011  . GERD (gastroesophageal reflux disease)    none recently  . GI bleed   . History of rheumatoid arthritis    during 30's, was treated.  . Hyperlipidemia   . Hypertension   . IBS (irritable bowel syndrome)    . Osteopenia 2017   Last  bone density 05/04/2017: -2.4  . PONV (postoperative nausea and vomiting)   . Stress incontinence   . Stroke [facility])    mini stroke - found on a CT scan   Past Surgical History  Past Surgical History:  Procedure Laterality Date  . ABDOMINAL HYSTERECTOMY    . BIOPSY  03/30/2020   Procedure: BIOPSY;  Surgeon: Ronald Lobo, MD;  Location: [facility] ENDOSCOPY;  Service: Endoscopy;;  . CARDIAC CATHETERIZATION     X 2, last one in 1998  . CHOLECYSTECTOMY N/A 04/13/2017   Procedure: LAPAROSCOPIC CHOLECYSTECTOMY;  Surgeon: Aviva Signs, MD;  Location: [facility] ORS;  Service: General;  Laterality: N/A;  . COLONOSCOPY    . COLONOSCOPY  May 2012   Dr. Olevia Perches: mild diverticulosis, otherwise normal.   . ESOPHAGOGASTRODUODENOSCOPY N/A 01/28/2015   Dr. Gala Romney: reflux esophagitis, Schatzki's ring not manipulated due to recent bleeding  . ESOPHAGOGASTRODUODENOSCOPY N/A 03/30/2015   Dr. Gala Romney: Schatzki's ring s/p Venia Minks dilation, previously noted esophageal ulcer completely healed  . ESOPHAGOGASTRODUODENOSCOPY N/A 03/30/2020   Procedure: ESOPHAGOGASTRODUODENOSCOPY (EGD);  Surgeon: Ronald Lobo, MD;  Location: [facility];  Service: Endoscopy;  Laterality: N/A;  . IR IMAGING GUIDED PORT INSERTION  03/13/2020  . LYMPH NODE BIOPSY Left 03/20/2020  Procedure: LEFT POSTERIOR CERVICAL LYMPH NODE BIOPSY;  Surgeon: Georganna Skeans, MD;  Location: [place];  Service: General;  Laterality: Left;  Marland Kitchen MALONEY DILATION N/A 03/30/2015   Procedure: Venia Minks DILATION;  Surgeon: Daneil Dolin, MD;  Location: [facility];  Service: Endoscopy;  Laterality: N/A;   Family History  Family History  Problem Relation Age of Onset  . Heart disease Mother   . Osteoarthritis Mother   . Sudden death Father   . Single kidney Father   . Other Father        h/o severe MVA injuries  . Hyperlipidemia Sister   . Other Daughter        Myalgias  . Fibromyalgia Daughter   . Allergies Daughter   . Heart disease  Maternal Grandfather   . Sudden death Paternal Grandmother   . Diabetes Paternal Grandfather   . Heart disease Daughter   . Other Daughter        palpitations  . Pulmonary fibrosis Maternal Aunt   . Cancer Paternal Uncle   . Pulmonary fibrosis Maternal Aunt   . Colon cancer Neg Hx    Social History  reports that she has never smoked. She has never used smokeless tobacco. She reports that she does not drink alcohol and does not use drugs. Allergies  Allergies  Allergen Reactions  . Iohexol Swelling    After injection of contrast, felt like her throat was closing, difficulty swallowing, and hiccups- 0512 03/30/20 [facility]  . Codeine Phosphate Nausea And Vomiting and Rash   Home medications Prior to Admission medications   Medication Sig Start Date End Date Taking? Authorizing Provider  acetaminophen (TYLENOL) 325 MG tablet Take 650 mg by mouth every 6 (six) hours as needed for moderate pain.    Yes [provider]  albuterol (VENTOLIN HFA) 108 (90 Base) MCG/ACT inhaler Inhale 2 puffs into the lungs every 6 (six) hours as needed for wheezing. 01/03/20 01/02/21 Yes Kuneff, Renee A, DO  allopurinol (ZYLOPRIM) 300 MG tablet Take 1 tablet (300 mg total) by mouth daily. 03/07/20  Yes Orson Slick, MD  amLODipine (NORVASC) 10 MG tablet Take 1 tablet (10 mg total) by mouth daily. 12/29/19  Yes Kuneff, Renee A, DO  Cholecalciferol (VITAMIN D3) 25 MCG (1000 UT) CAPS Take 1,000 Units by mouth daily.    Yes [provider]  dicyclomine (BENTYL) 10 MG capsule Take 1 capsule (10 mg total) by mouth 4 (four) times daily -  before meals and at bedtime. Needs appt for further refills. Patient taking differently: Take 10 mg by mouth 3 (three) times daily as needed for spasms.  11/25/19  Yes Kuneff, Renee A, DO  levothyroxine (SYNTHROID) 75 MCG tablet Take 1 tablet (75 mcg total) by mouth daily. 06/09/19  Yes Kuneff, Renee A, DO  lidocaine-prilocaine (EMLA) cream Apply 1 application topically as  needed. 04/12/20  Yes Orson Slick, MD  ondansetron (ZOFRAN) 8 MG tablet Take 1 tablet (8 mg total) by mouth every 8 (eight) hours as needed for nausea or vomiting. 03/07/20  Yes Orson Slick, MD  pantoprazole (PROTONIX) 40 MG tablet Take 1 tablet (40 mg total) by mouth 2 (two) times daily. After 8 weeks take only once daily. 04/01/20 05/27/20 Yes Swayze, Ava, DO  potassium chloride SA (KLOR-CON) 20 MEQ tablet Take 1 tablet (20 mEq total) by mouth daily. 04/26/20  Yes Orson Slick, MD  prochlorperazine (COMPAZINE) 10 MG tablet Take 1 tablet (10 mg  total) by mouth every 6 (six) hours as needed for nausea or vomiting. 03/07/20  Yes Orson Slick, MD  rOPINIRole (REQUIP) 0.25 MG tablet Take 1 tablet (0.25 mg total) by mouth at bedtime. 04/06/20  Yes Kuneff, Renee A, DO  sucralfate (CARAFATE) 1 GM/10ML suspension Take 10 mLs (1 g total) by mouth 4 (four) times daily as needed (for throat/mouth burning). 04/18/20  Yes Carlis Stable, NP  vitamin B-12 (CYANOCOBALAMIN) 1000 MCG tablet Take 1,000 mcg by mouth daily.   Yes [provider]       Exam Gen alert, wdwn, no distress, calm , lying flat No rash, cyanosis or gangrene Sclera anicteric, throat clear  No jvd or bruits Chest clear bilat on rales or wheezing RRR no MRG Abd soft nondist, mild bilat LQ tenderness, no ascites or hsm GU defer MS no joint effusions or deformity Ext no LE or UE edema, no wounds or ulcers Neuro is alert, Ox 3 , nf    Home meds:  - norvasc 10  - requip 0.25 hs  - synthroid 75 ug qd  - allopurinol 300 qd  - protonix 40 bid/ carafate 1 gm qid prn  - prn's/ vitamins/ supplements     CT abd/ pelv - IMPRESSION: 1. Marked severity thickening of the mid sigmoid colon with a mild amount of pericolonic inflammatory fat stranding. While this may represent sequelae associated with an acute infectious or inflammatory colitis, the presence of an underlying neoplastic process cannot be excluded.  Correlation with colonoscopy is recommended. 2. Evidence of prior cholecystectomy and hysterectomy. 3. Aortic atherosclerosis.   CT abd - Adrenals/Urinary Tract: Adrenal glands are unremarkable. Kidneys are normal in size, without renal calculi or hydronephrosis..... Bladder is unremarkable.    Na 133 CO2 20  BUN 21  Cr 1.45  CA 9.0  Alb 3.8  LFT's ok  eGFR 35  tsat 9%    ferr 1053  Hb 10.6, MCV 83   plt 115  WBC 4K   INR 1.0, PTT 26      UA negative    FOB +    Assessment/ Plan: 1. GI bleed - in pt who started chemo for NHL 5 wks ago.  Mid-sigmoid thickening per the CT today, not sure significance. GI consulted by ED MD. Hb 9- 10.5 in July, 9.7- 10.5 in Aug this year. Will get serial H/H every 6 hrs x3, avoid anticoagulants, await GI recs.   2. Renal failure - b/l creat is around 1.3- 1.6 dating back to April 2021. CT shows normal appearing kidneys.  3. HTN - stable here, cont norvasc 4. Volume - hasn't been eating well, will supply maint IVF's.  5. NHL stage IIIa- diagnosed w/ bx of L axilla lymph nodes in May 2021.  PET scan showed hypermetabolic LN's in multiple positions and markedly enlarge spleen. Chemo (bendamustine/ rituxan) was started 5 wks ago and 2nd round was 4 days ago on 8/12.   6. Esophagitis/ hematemesis - had EGD on recent admit post 1st rd of chemo when EGD showed esophagitis. On bid PPI and carafate. Hb rebounded up after this. Also has been getting IV Fe infusions and has one this week scheduled at [facility].        [name]  MD 04/30/2020, 8:43 PM

## 2020-04-30 NOTE — Telephone Encounter (Signed)
TCT pt's daughter, Brendia Sacks to follow up with her on her mother's condition. No answer but was able to leave vm message for [name] to call back at her convenience.  [phone]

## 2020-04-30 NOTE — Telephone Encounter (Signed)
Received call back from pt's daughter, Brendia Sacks. She is at her mother's now. She states that pt has passed quite a lot of blood and her mom is quite weak. Advised that [name] call 911 and have her mom transported to [facility] as it is closer. [name] stated that she would do that and will keep me updated on her mother's condition.

## 2020-04-30 NOTE — ED Notes (Signed)
Pt taken to CT at this time.

## 2020-04-30 NOTE — ED Provider Notes (Signed)
[facility] Provider Note   CSN: [id number] Arrival date & time: 04/30/20  1320     History Chief Complaint  Patient presents with  . Abdominal Pain  . Rectal Bleeding    Maleea YUE FLANIGAN is a 79 y.o. 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  She reports that since last night she has had about 4 episodes where she gets pain across her lower abdomen and then has about 2 to 3 tablespoons worth of bright red blood per rectum.  There is no feces mixed in with the blood.  She states that after she passes the blood her abdominal cramping improves and fully resolves.  She denies dysuria, increased frequency or urgency.  She denies any abnormal nausea or vomiting outside of her baseline.  She reports that she feels like she is more lightheaded with position changes and fatigued than her usual baseline.  She was admitted about a month ago for hematemesis after her first round of chemo.  She had an EGD and was noted to have marked esophagitis.  Her last chemo infusion was 04/26/2020.   She denies any fevers.  No rashes.   HPI     Past Medical History:  Diagnosis Date  . Asthma   . Bloating 04/26/2019  . Cancer ([place]) 2021   Lymphoma  . 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 DIABETES MELLITUS, TYPE II 11/09/2007   diet control  . Diverticulosis 03/2011  . GERD (gastroesophageal reflux disease)    none recently  . GI bleed   . History of rheumatoid arthritis    during 30's, was treated.  . Hyperlipidemia   . Hypertension   . IBS (irritable bowel syndrome)   . Osteopenia 2017   Last  bone density 05/04/2017: -2.4  . PONV (postoperative nausea and vomiting)   . Stress incontinence   . Stroke [facility])    mini stroke - found on a CT scan     Patient Active Problem List   Diagnosis Date Noted  . Dysphagia 04/18/2020  . Hematemesis 03/30/2020  . Port-A-Cath in place 03/28/2020  . Goals of care, counseling/discussion 03/01/2020  . Marginal zone lymphoma ([place]) 03/01/2020  . Cognitive deficits 01/23/2020  . Lymphadenopathy 01/23/2020  . Increased frequency of headaches 01/23/2020  . 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 Confusion 01/04/2020  . Hepatic steatosis 01/04/2020  . Splenomegaly 01/04/2020  . Thrombocytopenia ([place]) 01/04/2020  . Fecal incontinence 07/07/2019  . Secondary hyperparathyroidism ([place]) 06/09/2019  . Diarrhea 09/10/2018  . 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 Combined hyperlipidemia associated with type 2 diabetes mellitus ([place]) 01/29/2017  . Carotid artery stenosis 06/13/2016  . CKD stage 3 secondary to diabetes ([place]) 02/14/2016  . Vitamin D deficiency 05/25/2015  . Schatzki's ring   . Type 2 diabetes mellitus with complication ([place]) 30/86/5784  . Gastroesophageal reflux disease with esophagitis 03/06/2015  . Gout 06/09/2014  . Acquired hypothyroidism 05/30/2014  . Osteopenia 12/02/2013  . Major depression, recurrent, chronic ([place]) 04/03/2011  . Mild intermittent asthma 11/09/2007  . Essential hypertension, benign 11/09/2007    Past Surgical History:  Procedure Laterality Date  . ABDOMINAL HYSTERECTOMY    . BIOPSY  03/30/2020   Procedure: BIOPSY;  Surgeon: Ronald Lobo, MD;  Location: [facility];  Service: Endoscopy;;  . CARDIAC CATHETERIZATION     X 2, last one in 1998  . CHOLECYSTECTOMY N/A 04/13/2017   Procedure: LAPAROSCOPIC CHOLECYSTECTOMY;  Surgeon: Aviva Signs, MD;  Location: [facility] ORS;  Service: General;  Laterality: N/A;  . COLONOSCOPY    . COLONOSCOPY  May 2012   Dr. Olevia Perches: mild diverticulosis, otherwise normal.   . ESOPHAGOGASTRODUODENOSCOPY N/A 01/28/2015   Dr. Gala Romney: reflux esophagitis, Schatzki's ring not manipulated due to recent bleeding  .  ESOPHAGOGASTRODUODENOSCOPY N/A 03/30/2015   Dr. Gala Romney: Schatzki's ring s/p Venia Minks dilation, previously noted esophageal ulcer completely healed  . ESOPHAGOGASTRODUODENOSCOPY N/A 03/30/2020   Procedure: ESOPHAGOGASTRODUODENOSCOPY (EGD);  Surgeon: Ronald Lobo, MD;  Location: [facility];  Service: Endoscopy;  Laterality: N/A;  . IR IMAGING GUIDED PORT INSERTION  03/13/2020  . LYMPH NODE BIOPSY Left 03/20/2020   Procedure: LEFT POSTERIOR CERVICAL LYMPH NODE BIOPSY;  Surgeon: Georganna Skeans, MD;  Location: [facility];  Service: General;  Laterality: Left;  Marland Kitchen MALONEY DILATION N/A 03/30/2015   Procedure: Venia Minks DILATION;  Surgeon: Daneil Dolin, MD;  Location: [facility];  Service: Endoscopy;  Laterality: N/A;     OB History   No obstetric history on file.     Family History  Problem Relation Age of Onset  . Heart disease Mother   . Osteoarthritis Mother   . Sudden death Father   . Single kidney Father   . Other Father        h/o severe MVA injuries  . Hyperlipidemia Sister   . Other Daughter        Myalgias  . Fibromyalgia Daughter   . Allergies Daughter   . Heart disease Maternal Grandfather   . Sudden death Paternal Grandmother   . Diabetes Paternal Grandfather   . Heart disease Daughter   . Other Daughter        palpitations  . Pulmonary fibrosis Maternal Aunt   . Cancer Paternal Uncle   . Pulmonary fibrosis Maternal Aunt   . Colon cancer Neg Hx     Social History   Tobacco Use  . Smoking status: Never Smoker  . Smokeless tobacco: Never Used  Vaping Use  . Vaping Use: Never used  Substance Use Topics  . Alcohol use: No  . Drug use: No    Home Medications Prior to Admission medications   Medication Sig Start Date End Date Taking? Authorizing Provider  acetaminophen (TYLENOL) 325 MG tablet Take 650 mg by mouth every 6 (six) hours as needed for moderate pain.    Yes [provider]  albuterol (VENTOLIN HFA) 108 (90 Base) MCG/ACT inhaler Inhale 2 puffs  into the lungs every 6 (six) hours as needed for wheezing. 01/03/20 01/02/21 Yes Kuneff, Renee A, DO  allopurinol (ZYLOPRIM) 300 MG tablet Take 1 tablet (300 mg total) by mouth daily. 03/07/20  Yes Orson Slick, MD  amLODipine (NORVASC) 10 MG tablet Take 1 tablet (10 mg total) by mouth daily. 12/29/19  Yes Kuneff, Renee A, DO  Cholecalciferol (VITAMIN D3) 25 MCG (1000 UT) CAPS Take 1,000 Units by mouth daily.    Yes [provider]  dicyclomine (BENTYL) 10 MG capsule Take 1 capsule (10 mg total) by mouth 4 (four) times daily -  before meals and at bedtime. Needs appt for further refills. Patient taking differently: Take 10 mg by mouth 3 (three) times daily as needed for spasms.  11/25/19  Yes Kuneff, Renee A, DO  levothyroxine (SYNTHROID) 75 MCG tablet Take 1  tablet (75 mcg total) by mouth daily. 06/09/19  Yes Kuneff, Renee A, DO  lidocaine-prilocaine (EMLA) cream Apply 1 application topically as needed. 04/12/20  Yes Orson Slick, MD  ondansetron (ZOFRAN) 8 MG tablet Take 1 tablet (8 mg total) by mouth every 8 (eight) hours as needed for nausea or vomiting. 03/07/20  Yes Orson Slick, MD  pantoprazole (PROTONIX) 40 MG tablet Take 1 tablet (40 mg total) by mouth 2 (two) times daily. After 8 weeks take only once daily. 04/01/20 05/27/20 Yes Swayze, Ava, DO  potassium chloride SA (KLOR-CON) 20 MEQ tablet Take 1 tablet (20 mEq total) by mouth daily. 04/26/20  Yes Orson Slick, MD  prochlorperazine (COMPAZINE) 10 MG tablet Take 1 tablet (10 mg total) by mouth every 6 (six) hours as needed for nausea or vomiting. 03/07/20  Yes Orson Slick, MD  rOPINIRole (REQUIP) 0.25 MG tablet Take 1 tablet (0.25 mg total) by mouth at bedtime. 04/06/20  Yes Kuneff, Renee A, DO  sucralfate (CARAFATE) 1 GM/10ML suspension Take 10 mLs (1 g total) by mouth 4 (four) times daily as needed (for throat/mouth burning). 04/18/20  Yes Carlis Stable, NP  vitamin B-12 (CYANOCOBALAMIN) 1000 MCG tablet Take 1,000 mcg  by mouth daily.   Yes [provider]    Allergies    Iohexol and Codeine phosphate  Review of Systems   Review of Systems  Constitutional: Positive for fatigue. Negative for chills and fever.  HENT: Negative for trouble swallowing.   Respiratory: Positive for cough and shortness of breath.   Gastrointestinal: Positive for abdominal pain, blood in stool and nausea.  Genitourinary: Negative for dysuria.  Musculoskeletal: Negative for back pain.  Skin: Negative for color change and rash.  Neurological: Positive for weakness ([place]) and light-headedness. Negative for numbness and headaches.  All other systems reviewed and are negative.   Physical Exam Updated Vital Signs BP 124/82   Pulse (!) 102   Temp 99.1 F (37.3 C)   Resp 20   Ht 5\' 1"  (1.549 m)   Wt 60.8 kg   SpO2 100%   BMI 25.32 kg/m   Physical Exam Vitals and nursing note reviewed. Exam conducted with a chaperone present.  Constitutional:      General: She is not in acute distress.    Appearance: She is well-developed. She is not diaphoretic.  HENT:     Head: Normocephalic and atraumatic.  Eyes:     General: No scleral icterus.       Right eye: No discharge.        Left eye: No discharge.     Conjunctiva/sclera: Conjunctivae normal.  Cardiovascular:     Rate and Rhythm: Normal rate and regular rhythm.  Pulmonary:     Effort: Pulmonary effort is normal. No respiratory distress.     Breath sounds: No stridor.  Abdominal:     General: There is no distension.     Palpations: Abdomen is soft.     Tenderness: There is abdominal tenderness in the right lower quadrant.  Genitourinary:    Comments: Bright red blood in the rectal vault. Musculoskeletal:        General: No deformity.     Cervical back: Normal range of motion.  Skin:    General: Skin is warm and dry.  Neurological:     Mental Status: She is alert.     Motor: No abnormal muscle tone.  Psychiatric:  Behavior: Behavior normal.      ED Results / Procedures / Treatments   Labs (all labs ordered are listed, but only abnormal results are displayed) Labs Reviewed  COMPREHENSIVE METABOLIC PANEL - Abnormal; Notable for the following components:      Result Value   Sodium 133 (*)    CO2 20 (*)    Glucose, Bld 160 (*)    Creatinine, Ser 1.45 (*)    GFR calc non Af Amer 35 (*)    GFR calc Af Amer 40 (*)    All other components within normal limits  CBC WITH DIFFERENTIAL/PLATELET - Abnormal; Notable for the following components:   RBC 3.81 (*)    Hemoglobin 10.6 (*)    HCT 31.7 (*)    RDW 16.5 (*)    Platelets 115 (*)    Lymphs Abs 0.1 (*)    All other components within normal limits  VITAMIN B12 - Abnormal; Notable for the following components:   Vitamin B-12 940 (*)    All other components within normal limits  IRON AND TIBC - Abnormal; Notable for the following components:   Saturation Ratios 9 (*)    All other components within normal limits  FERRITIN - Abnormal; Notable for the following components:   Ferritin 1,053 (*)    All other components within normal limits  RETICULOCYTES - Abnormal; Notable for the following components:   RBC. 3.83 (*)    All other components within normal limits  URINALYSIS, ROUTINE W REFLEX MICROSCOPIC - Abnormal; Notable for the following components:   Hgb urine dipstick SMALL (*)    Bacteria, UA RARE (*)    All other components within normal limits  LACTIC ACID, PLASMA - Abnormal; Notable for the following components:   Lactic Acid, Venous 2.3 (*)    All other components within normal limits  LACTIC ACID, PLASMA - Abnormal; Notable for the following components:   Lactic Acid, Venous 2.3 (*)    All other components within normal limits  POC OCCULT BLOOD, ED - Abnormal; Notable for the following components:   Fecal Occult Bld POSITIVE (*)    All other components within normal limits  CULTURE, BLOOD (ROUTINE X 2)  CULTURE, BLOOD (ROUTINE X 2)  SARS CORONAVIRUS 2 BY RT  PCR (HOSPITAL ORDER, [facility])  PROTIME-INR  APTT  FOLATE  TYPE AND SCREEN    EKG EKG Interpretation  Date/Time:  Monday April 30 2020 14:05:46 EDT Ventricular Rate:  108 PR Interval:    QRS Duration: 98 QT Interval:  331 QTC Calculation: 444 R Axis:   29 Text Interpretation: Sinus tachycardia Inferior MI, old Poor R-wave progression No significant change since 03/30/2020 Confirmed by Veryl Speak [id number]) on 04/30/2020 3:14:26 PM   Radiology CT Abdomen Pelvis Wo Contrast  Result Date: 04/30/2020 CLINICAL DATA:  Rectal bleeding and bloody stool. EXAM: CT ABDOMEN AND PELVIS WITHOUT CONTRAST TECHNIQUE: Multidetector CT imaging of the abdomen and pelvis was performed following the standard protocol without IV contrast. COMPARISON:  March 31, 2019 FINDINGS: Lower chest: No acute abnormality. Hepatobiliary: No focal liver abnormality is seen. Status post cholecystectomy. No biliary dilatation. Pancreas: Unremarkable. No pancreatic ductal dilatation or surrounding inflammatory changes. Spleen: Normal in size without focal abnormality. Adrenals/Urinary Tract: Adrenal glands are unremarkable. Kidneys are normal in size, without renal calculi or hydronephrosis. A 1.5 cm diameter exophytic cyst is seen along the lateral aspect of the mid right kidney. Bladder is unremarkable. Stomach/Bowel: Stomach is within normal  limits. Appendix appears normal. A small amount of retained barium is seen within the distal transverse colon. No evidence of bowel dilatation. Marked severity thickening of the mid sigmoid colon is seen. A mild amount of pericolonic inflammatory fat stranding is also noted. Vascular/Lymphatic: There is marked severity calcification of the abdominal aorta and bilateral common iliac arteries, without evidence of aneurysmal dilatation. No enlarged abdominal or pelvic lymph nodes. Reproductive: Status post hysterectomy. No adnexal masses. Other: No abdominal wall  hernia or abnormality. No abdominopelvic ascites. Musculoskeletal: No acute or significant osseous findings. IMPRESSION: 1. Marked severity thickening of the mid sigmoid colon with a mild amount of pericolonic inflammatory fat stranding. While this may represent sequelae associated with an acute infectious or inflammatory colitis, the presence of an underlying neoplastic process cannot be excluded. Correlation with colonoscopy is recommended. 2. Evidence of prior cholecystectomy and hysterectomy. 3. Aortic atherosclerosis. Aortic Atherosclerosis (ICD10-I70.0). Electronically Signed   By: Virgina Norfolk M.D.   On: 04/30/2020 17:24    Procedures Procedures (including critical care time)  Medications Ordered in ED Medications  sodium chloride 0.9 % bolus 1,000 mL (0 mLs Intravenous Stopped 04/30/20 1647)    ED Course  I have reviewed the triage vital signs and the nursing notes.  Pertinent labs & imaging results that were available during my care of the patient were reviewed by me and considered in my medical decision making (see chart for details).  Clinical Course as of Apr 30 2126  Mon Apr 30, 2020  1947 Dr. Jenne Pane of GI says ok to be on clears, no use for CT with contrast at this time.  Medical admit   [EH]    Clinical Course User Index [EH] Ollen Gross   MDM Rules/Calculators/A&P                         Patient is a 79 year old woman who presents today for evaluation of about 5 episodes of small-volume bright red blood per rectum with cramping abdominal pain.  On my exam her abdominal pain is mostly right lower quadrant and she still has her appendix.  She does not have significant leukocytosis, consistent tachycardia or tachypnea.  No apparent infectious source, does not meet sepsis criteria.  Lactic acid is slightly elevated, this may be due to chemotherapy along with poor p.o. intake.  DRE with bright red blood.  She is given a liter of IV saline.  Anemia panel is  sent.  Here her hemoglobin is 10.6 which appears roughly consistent with her baseline.  Blood cultures were sent.   Covid test is negative.  CT abdomen pelvis was obtained without contrast showing concern for inflammation versus metastatic process with recommendation for possible colonoscopy.  I spoke with Dr. Abbey Chatters of GI, he states no indication for CT with contrast at this time, have patient admitted to medicine on clears.  May require colonoscopy day after tomorrow.  I spoke with Hospitalist Dr. Jonnie Finner who will see patient for admission.   Note: Portions of this report may have been transcribed using voice recognition software. Every effort was made to ensure accuracy; however, inadvertent computerized transcription errors may be present   Final Clinical Impression(s) / ED Diagnoses Final diagnoses:  Rectal bleeding    Rx / DC Orders ED Discharge Orders    None       Ollen Gross 04/30/20 2128    Varney Biles, MD 05/01/20 [phone]

## 2020-04-30 NOTE — ED Triage Notes (Signed)
Chemo patient , states she noted rectal bleeding last night

## 2020-04-30 NOTE — ED Notes (Signed)
Date and time results received: 04/30/20 1534  Test: Lactic Acid Critical Value: 2.3  Name of Provider Notified: Santa Genera  Orders Received? Or Actions Taken?: No new orders given.

## 2020-04-30 NOTE — ED Notes (Signed)
Attempted to acces pt port. Unsuccessful x1. Pt requests peripheral IV be placed. Attempted IV in RAC with out success. ED charge at bedside attempting to obtain blood work and IV access.

## 2020-05-01 LAB — BASIC METABOLIC PANEL
Anion gap: 11 (ref 5–15)
BUN: 16 mg/dL (ref 8–23)
CO2: 20 mmol/L — ABNORMAL LOW (ref 22–32)
Calcium: 8.1 mg/dL — ABNORMAL LOW (ref 8.9–10.3)
Chloride: 101 mmol/L (ref 98–111)
Creatinine, Ser: 1.06 mg/dL — ABNORMAL HIGH (ref 0.44–1.00)
GFR calc Af Amer: 58 mL/min — ABNORMAL LOW (ref >60)
GFR calc non Af Amer: 50 mL/min — ABNORMAL LOW (ref >60)
Glucose, Bld: 137 mg/dL — ABNORMAL HIGH (ref 70–99)
Potassium: 2.9 mmol/L — ABNORMAL LOW (ref 3.5–5.1)
Sodium: 132 mmol/L — ABNORMAL LOW (ref 135–145)

## 2020-05-01 LAB — CBC
HCT: 24.5 % — ABNORMAL LOW (ref 36.0–46.0)
Hemoglobin: 8.2 g/dL — ABNORMAL LOW (ref 12.0–15.0)
MCH: 27.7 pg (ref 26.0–34.0)
MCHC: 33.5 g/dL (ref 30.0–36.0)
MCV: 82.8 fL (ref 80.0–100.0)
Platelets: 89 10*3/uL — ABNORMAL LOW (ref 150–400)
RBC: 2.96 MIL/uL — ABNORMAL LOW (ref 3.87–5.11)
RDW: 16.1 % — ABNORMAL HIGH (ref 11.5–15.5)
WBC: 3.2 10*3/uL — ABNORMAL LOW (ref 4.0–10.5)
nRBC: 0 % (ref 0.0–0.2)

## 2020-05-01 LAB — HEMOGLOBIN AND HEMATOCRIT, BLOOD
HCT: 25.6 % — ABNORMAL LOW (ref 36.0–46.0)
Hemoglobin: 8.5 g/dL — ABNORMAL LOW (ref 12.0–15.0)

## 2020-05-01 MED ORDER — DEXTROSE-NACL 5-0.9 % IV SOLN: INTRAVENOUS | Status: DC

## 2020-05-01 MED ORDER — SUCRALFATE 1 GM/10ML PO SUSP
1.0000 g | Freq: Three times a day (TID) | ORAL | Status: DC
  Administered 2020-05-01 – 2020-05-05 (×13): 1 g via ORAL
  Filled 2020-05-01 (×13): qty 10

## 2020-05-01 MED ORDER — DICYCLOMINE HCL 10 MG PO CAPS
10.0000 mg | ORAL_CAPSULE | Freq: Three times a day (TID) | ORAL | Status: DC
  Administered 2020-05-01 – 2020-05-05 (×13): 10 mg via ORAL
  Filled 2020-05-01 (×13): qty 1

## 2020-05-01 MED ORDER — POTASSIUM CHLORIDE 10 MEQ/100ML IV SOLN
10.0000 meq | INTRAVENOUS | Status: AC
  Administered 2020-05-01 (×4): 10 meq via INTRAVENOUS
  Filled 2020-05-01 (×2): qty 100

## 2020-05-01 MED ORDER — POLYETHYLENE GLYCOL 3350 17 GM/SCOOP PO POWD
1.0000 | Freq: Once | ORAL | Status: DC
  Filled 2020-05-01: qty 255

## 2020-05-01 MED ORDER — POTASSIUM CHLORIDE CRYS ER 20 MEQ PO TBCR
40.0000 meq | EXTENDED_RELEASE_TABLET | Freq: Once | ORAL | Status: AC
  Administered 2020-05-01: 40 meq via ORAL
  Filled 2020-05-01: qty 2

## 2020-05-01 NOTE — Progress Notes (Signed)
Patient up to bathroom and had a small red currrant like clump of output. No stool noted.

## 2020-05-01 NOTE — Progress Notes (Addendum)
PROGRESS NOTE    Elizabeth Mcintyre  JKK:[id number] DOB: [birth year] DOA: 04/30/2020 PCP: Ma Hillock, DO   Brief Narrative:  Per HPI: The patient is a 79 y.o. year-old w/ hx of CVA, HTN, HL, GI bleed, hx RA, DM2, anxiety/ depression, hx lymphoma 2021 currently on chemoRx. Pt presented today for rectal bleeding that started last night. She has had about 4 episodes of pain in the lower abdomen then passing 2 - 3 tbsp's of bright red blood per rectum. Not mixed in with feces. No voiding issues. No diarrhea or fevers. +lightheaded and fatigued than usual.  Had 1st rd of chemo 1 mo ago and required admit afterwards for hematemesis, EGD showed esophagitis. Last chemo infusion was 04/26/20. ED spoke w/ GI on call , they recommend admission and they will see in am, tentatively planning for colonoscopy.  -Patient was admitted with GI bleed and is awaiting GI evaluation.  Hemoglobin is downward trending.  Assessment & Plan:   Principal Problem:   Lower GI bleeding Active Problems:   Essential hypertension, benign   Acquired hypothyroidism   Gastroesophageal reflux disease with esophagitis   CKD stage 3 secondary to diabetes (HCC)   Marginal zone lymphoma (HCC)   GI bleed   Anemia, mild   Active chemotherapy   Mild acute blood loss anemia suspected secondary to lower GI bleed -Mid sigmoid thickening per CT scan today of unknown significance -Hemoglobin downward trending with plans to transfuse with hemoglobin less than 7 -Avoid anticoagulation -Appreciate GI evaluation -PPI twice daily  -Continue IV fluid and clear liquid diet for now -Plan for IV iron infusion this week at [facility]; may consider infusion while inpatient if no sign of infection noted  Hypokalemia -Replete and reevaluate in a.m. along with magnesium  Mild hyponatremia -Change IV fluid to D5 NS  Prior history of esophagitis/hematemesis -Recent EGD with esophagitis -Continue PPI twice daily and Carafate  CKD stage  IIIa -Creatinine appears to be 1.3-1.6 previously, but is 1.06  Hypertension-stable -Continue Norvasc and monitor  Hypothyroidism -Continue Synthroid  NHL stage IIIa -Started on chemo with bendamustine/Rituxan 5 weeks ago -Second round was 5 days ago on 8/12  DVT prophylaxis: SCDs Code Status: Full Family Communication: Discussed with daughter [name] at bedside on 8/17 Disposition Plan:  Status is: Inpatient  Remains inpatient appropriate because:IV treatments appropriate due to intensity of illness or inability to take PO and Inpatient level of care appropriate due to severity of illness   Dispo: The patient is from: Home              Anticipated d/c is to: Home              Anticipated d/c date is: 2 days              Patient currently is not medically stable to d/c.  Continue to monitor labs.  Awaiting GI evaluation.  Consultants:   GI  Procedures:   See below  Antimicrobials:  Anti-infectives (From admission, onward)   None       Subjective: Patient seen and evaluated today with frequent small-volume loose stools noted.  She denies any nausea or vomiting.  She states that she has mild lower abdominal pain that is ongoing. No acute concerns or events noted overnight.  Objective: Vitals:   04/30/20 2330 05/01/20 0015 05/01/20 0400 05/01/20 0415  BP: (!) 137/58 (!) 117/47  (!) 116/52  Pulse: 90 90  82  Resp: 20   18  Temp: 98.5 F (36.9 C) 100.2 F (37.9 C)  99.4 F (37.4 C)  SpO2: 97% 100%  100%  Weight:   59.6 kg   Height:   5\' 4"  (1.626 m)     Intake/Output Summary (Last 24 hours) at 05/01/2020 1436 Last data filed at 05/01/2020 0600 Gross per 24 hour  Intake 433.76 ml  Output --  Net 433.76 ml   Filed Weights   04/30/20 1329 05/01/20 0400  Weight: 60.8 kg 59.6 kg    Examination:  General exam: Appears calm and comfortable  Respiratory system: Clear to auscultation. Respiratory effort normal. Cardiovascular system: S1 & S2 heard, RRR.   Gastrointestinal system: Abdomen is nondistended, soft and nontender.  Central nervous system: Alert and oriented. No focal neurological deficits. Extremities: Symmetric 5 x 5 power. Skin: No rashes, lesions or ulcers Psychiatry: Judgement and insight appear normal. Mood & affect appropriate.     Data Reviewed: I have personally reviewed following labs and imaging studies  CBC: Recent Labs  Lab 04/26/20 0804 04/30/20 1440 04/30/20 2212 05/01/20 0358 05/01/20 0934  WBC 3.9* 4.4  --  3.2*  --   NEUTROABS 2.5 4.0  --   --   --   HGB 10.3* 10.6* 9.0* 8.2* 8.5*  HCT 29.8* 31.7* 26.6* 24.5* 25.6*  MCV 80.1 83.2  --  82.8  --   PLT 106* 115*  --  89*  --    Basic Metabolic Panel: Recent Labs  Lab 04/26/20 0804 04/30/20 1440 05/01/20 0358  NA 136 133* 132*  K 3.0* 3.7 2.9*  CL 103 101 101  CO2 19* 20* 20*  GLUCOSE 206* 160* 137*  BUN 11 21 16   CREATININE 1.38* 1.45* 1.06*  CALCIUM 9.7 9.0 8.1*   GFR: Estimated Creatinine Clearance: 37.8 mL/min (A) (by C-G formula based on SCr of 1.06 mg/dL (H)). Liver Function Tests: Recent Labs  Lab 04/26/20 0804 04/30/20 1440  AST 15 19  ALT 8 13  ALKPHOS 58 50  BILITOT 0.5 0.7  PROT 6.9 6.8  ALBUMIN 3.4* 3.8   No results for input(s): LIPASE, AMYLASE in the last 168 hours. No results for input(s): AMMONIA in the last 168 hours. Coagulation Profile: Recent Labs  Lab 04/30/20 1440  INR 1.0   Cardiac Enzymes: No results for input(s): CKTOTAL, CKMB, CKMBINDEX, TROPONINI in the last 168 hours. BNP (last 3 results) No results for input(s): PROBNP in the last 8760 hours. HbA1C: No results for input(s): HGBA1C in the last 72 hours. CBG: No results for input(s): GLUCAP in the last 168 hours. Lipid Profile: No results for input(s): CHOL, HDL, LDLCALC, TRIG, CHOLHDL, LDLDIRECT in the last 72 hours. Thyroid Function Tests: No results for input(s): TSH, T4TOTAL, FREET4, T3FREE, THYROIDAB in the last 72 hours. Anemia  Panel: Recent Labs    04/30/20 1440  VITAMINB12 940*  FOLATE 6.4  FERRITIN 1,053*  TIBC 380  IRON 34  RETICCTPCT 2.5   Sepsis Labs: Recent Labs  Lab 04/30/20 1440 04/30/20 1700  LATICACIDVEN 2.3* 2.3*    Recent Results (from the past 240 hour(s))  Culture, blood (routine x 2)     Status: None (Preliminary result)   Collection Time: 04/30/20  3:12 PM   Specimen: Right Antecubital; Blood  Result Value Ref Range Status   Specimen Description RIGHT ANTECUBITAL  Final   Special Requests   Final    BOTTLES DRAWN AEROBIC AND ANAEROBIC Blood Culture adequate volume Performed at [facility], [address], [place], [place]  [address]    Culture PENDING  Incomplete   Report Status PENDING  Incomplete  Culture, blood (routine x 2)     Status: None (Preliminary result)   Collection Time: 04/30/20  3:13 PM   Specimen: Right Antecubital; Blood  Result Value Ref Range Status   Specimen Description BLOOD RIGHT HAND  Final   Special Requests   Final    BOTTLES DRAWN AEROBIC AND ANAEROBIC Blood Culture adequate volume Performed at [facility], [address], [place], [place] [address]    Culture PENDING  Incomplete   Report Status PENDING  Incomplete  SARS Coronavirus 2 by RT PCR (hospital order, performed in [place] hospital lab) Nasopharyngeal Nasopharyngeal Swab     Status: None   Collection Time: 04/30/20  6:18 PM   Specimen: Nasopharyngeal Swab  Result Value Ref Range Status   SARS Coronavirus 2 NEGATIVE NEGATIVE Final    Comment: (NOTE) SARS-CoV-2 target nucleic acids are NOT DETECTED.  The SARS-CoV-2 RNA is generally detectable in upper and lower respiratory specimens during the acute phase of infection. The lowest concentration of SARS-CoV-2 viral copies this assay can detect is 250 copies / mL. A negative result does not preclude SARS-CoV-2 infection and should not be used as the sole basis for treatment or other patient management decisions.  A negative result may  occur with improper specimen collection / handling, submission of specimen other than nasopharyngeal swab, presence of viral mutation(s) within the areas targeted by this assay, and inadequate number of viral copies (<250 copies / mL). A negative result must be combined with clinical observations, patient history, and epidemiological information.  Fact Sheet for Patients:   StrictlyIdeas.no  Fact Sheet for Healthcare Providers: BankingDealers.co.za  This test is not yet approved or  cleared by the [place] FDA and has been authorized for detection and/or diagnosis of SARS-CoV-2 by FDA under an Emergency Use Authorization (EUA).  This EUA will remain in effect (meaning this test can be used) for the duration of the COVID-19 declaration under Section 564(b)(1) of the Act, 21 U.S.C. section 360bbb-3(b)(1), unless the authorization is terminated or revoked sooner.  Performed at [facility], [address], [place], [place] [address]          Radiology Studies: CT Abdomen Pelvis Wo Contrast  Result Date: 04/30/2020 CLINICAL DATA:  Rectal bleeding and bloody stool. EXAM: CT ABDOMEN AND PELVIS WITHOUT CONTRAST TECHNIQUE: Multidetector CT imaging of the abdomen and pelvis was performed following the standard protocol without IV contrast. COMPARISON:  March 31, 2019 FINDINGS: Lower chest: No acute abnormality. Hepatobiliary: No focal liver abnormality is seen. Status post cholecystectomy. No biliary dilatation. Pancreas: Unremarkable. No pancreatic ductal dilatation or surrounding inflammatory changes. Spleen: Normal in size without focal abnormality. Adrenals/Urinary Tract: Adrenal glands are unremarkable. Kidneys are normal in size, without renal calculi or hydronephrosis. A 1.5 cm diameter exophytic cyst is seen along the lateral aspect of the mid right kidney. Bladder is unremarkable. Stomach/Bowel: Stomach is within normal limits. Appendix  appears normal. A small amount of retained barium is seen within the distal transverse colon. No evidence of bowel dilatation. Marked severity thickening of the mid sigmoid colon is seen. A mild amount of pericolonic inflammatory fat stranding is also noted. Vascular/Lymphatic: There is marked severity calcification of the abdominal aorta and bilateral common iliac arteries, without evidence of aneurysmal dilatation. No enlarged abdominal or pelvic lymph nodes. Reproductive: Status post hysterectomy. No adnexal masses. Other: No abdominal wall hernia or abnormality. No abdominopelvic ascites.  Musculoskeletal: No acute or significant osseous findings. IMPRESSION: 1. Marked severity thickening of the mid sigmoid colon with a mild amount of pericolonic inflammatory fat stranding. While this may represent sequelae associated with an acute infectious or inflammatory colitis, the presence of an underlying neoplastic process cannot be excluded. Correlation with colonoscopy is recommended. 2. Evidence of prior cholecystectomy and hysterectomy. 3. Aortic atherosclerosis. Aortic Atherosclerosis (ICD10-I70.0). Electronically Signed   By: Virgina Norfolk M.D.   On: 04/30/2020 17:24        Scheduled Meds: . allopurinol  300 mg Oral Daily  . amLODipine  10 mg Oral Daily  . cholecalciferol  1,000 Units Oral Daily  . levothyroxine  75 mcg Oral Daily  . pantoprazole  40 mg Oral BID  . rOPINIRole  0.25 mg Oral QHS  . vitamin B-12  1,000 mcg Oral Daily   Continuous Infusions: . dextrose 5 % and 0.45% NaCl 75 mL/hr at 05/01/20 1239     LOS: 1 day    Time spent: 35 minutes    Ayub Kirsh Darleen Crocker, DO [facility]  If 7PM-7AM, please contact night-coverage [email or web address] 05/01/2020, 2:36 PM

## 2020-05-01 NOTE — Progress Notes (Deleted)
Maylon Peppers, M.D. Gastroenterology & Hepatology                                           Patient Name: Elizabeth Mcintyre Account #: _0 @   MRN: [id number] Admission Date: 04/30/2020 Date of Evaluation:  05/01/2020 Time of Evaluation: 3:09 PM   Referring Physician: Dr. Manuella Ghazi  Chief Complaint:  Rectal bleeding HPI:  This is a 79 year old female with past medical history of marginal zone lymphoma, asthma, diabetes, GERD, dysphagia secondary to Schatzki's ring and IBS, who came to the hospital after presenting episodes of rectal bleeding.  The patient reports that since Sunday morning she presented some episodes of lower abdominal cramping intermittently which were followed by episodes of large fresh blood in her stool.  She reported that she has presented these episodes of bleeding several times, today they were less frequent than before.  She endorses each bowel movement had a tablespoon of blood.  She did not notice some hematochezia today which was not present the prior days.  Has not seen any melena.  She denies taking any NSAIDs or anticoagulants.  Never had similar symptoms in the past.  She reports that she has had 2 cycles of chemotherapy so far, with most recent one on Friday.  She is currently receiving Bendamustine/Rituximab.  States feeling slightly nauseated but has not vomited. The patient denies having any fever, chills, hematochezia, melena, hematemesis, abdominal distention,  diarrhea, jaundice, pruritus or major weight loss.  Notably, she follows with [facility] for management of episodes of dysphagia secondary to Schatzki's ring.  Also endorsed having intermittent episodes diarrhea.  Most recent EGD was performed 03/30/2020 where she was found to have a nonobstructing Schatzki ring and presence of erosive esophagitis extending 15 cm but no presence of ulcers.  Her most recent colonoscopy was performed in 2012 when she was found to have diverticulosis.  In the ED, she was HD stable  and afebrile. Labs were remarkable for hemoglobin of 10.6 with MCV of 83, normal white blood cell count but mild thrombocytopenia of 115, presence of elevated ferritin of 1053 with rest of iron stores within normal limits, CMP showing BUN of 21 and creatinine 1.45 electrolytes within normal limits, normal liver panel, INR was 1.0, lactic acid was 2.3 elevated.  Today her most recent hemoglobin was 8.2 with persistently low platelet count of 89,000 and white blood cell count of 3.2.  Her potassium was 2.9.  CT of the abdomen and pelvis without IV contrast showed severe thickening of the mid sigmoid colon with some presence of fat stranding of unclear etiology.  Social: neg smoking, alcohol or illicit drug use Surgical: non contributory  Past Medical History: SEE CHRONIC ISSSUES: Past Medical History:  Diagnosis Date  . Asthma   . Bloating 04/26/2019  . Cancer ([place]) 2021   Lymphoma  . 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 DIABETES MELLITUS, TYPE II 11/09/2007   diet control  . Diverticulosis 03/2011  . GERD (gastroesophageal reflux disease)    none recently  . GI bleed   . History of rheumatoid arthritis    during 30's, was treated.  . Hyperlipidemia   . Hypertension   . IBS (irritable bowel syndrome)   . Osteopenia 2017   Last  bone density 05/04/2017: -2.4  . PONV (postoperative nausea and  vomiting)   . Stress incontinence   . Stroke [facility])    mini stroke - found on a CT scan   Past Surgical History:  Past Surgical History:  Procedure Laterality Date  . ABDOMINAL HYSTERECTOMY    . BIOPSY  03/30/2020   Procedure: BIOPSY;  Surgeon: Ronald Lobo, MD;  Location: [facility];  Service: Endoscopy;;  . CARDIAC CATHETERIZATION     X 2, last one in 1998  . CHOLECYSTECTOMY N/A 04/13/2017   Procedure: LAPAROSCOPIC CHOLECYSTECTOMY;  Surgeon: Aviva Signs, MD;  Location: [facility];  Service: General;  Laterality: N/A;  . COLONOSCOPY    .  COLONOSCOPY  May 2012   Dr. Olevia Perches: mild diverticulosis, otherwise normal.   . ESOPHAGOGASTRODUODENOSCOPY N/A 01/28/2015   Dr. Gala Romney: reflux esophagitis, Schatzki's ring not manipulated due to recent bleeding  . ESOPHAGOGASTRODUODENOSCOPY N/A 03/30/2015   Dr. Gala Romney: Schatzki's ring s/p Venia Minks dilation, previously noted esophageal ulcer completely healed  . ESOPHAGOGASTRODUODENOSCOPY N/A 03/30/2020   Procedure: ESOPHAGOGASTRODUODENOSCOPY (EGD);  Surgeon: Ronald Lobo, MD;  Location: [facility];  Service: Endoscopy;  Laterality: N/A;  . IR IMAGING GUIDED PORT INSERTION  03/13/2020  . LYMPH NODE BIOPSY Left 03/20/2020   Procedure: LEFT POSTERIOR CERVICAL LYMPH NODE BIOPSY;  Surgeon: Georganna Skeans, MD;  Location: [place];  Service: General;  Laterality: Left;  Marland Kitchen MALONEY DILATION N/A 03/30/2015   Procedure: Venia Minks DILATION;  Surgeon: Daneil Dolin, MD;  Location: [facility];  Service: Endoscopy;  Laterality: N/A;   Family History:  Family History  Problem Relation Age of Onset  . Heart disease Mother   . Osteoarthritis Mother   . Sudden death Father   . Single kidney Father   . Other Father        h/o severe MVA injuries  . Hyperlipidemia Sister   . Other Daughter        Myalgias  . Fibromyalgia Daughter   . Allergies Daughter   . Heart disease Maternal Grandfather   . Sudden death Paternal Grandmother   . Diabetes Paternal Grandfather   . Heart disease Daughter   . Other Daughter        palpitations  . Pulmonary fibrosis Maternal Aunt   . Cancer Paternal Uncle   . Pulmonary fibrosis Maternal Aunt   . Colon cancer Neg Hx    Social History:  Social History   Tobacco Use  . Smoking status: Never Smoker  . Smokeless tobacco: Never Used  Vaping Use  . Vaping Use: Never used  Substance Use Topics  . Alcohol use: No  . Drug use: No    Home Medications:  Prior to Admission medications   Medication Sig Start Date End Date Taking? Authorizing Provider  acetaminophen  (TYLENOL) 325 MG tablet Take 650 mg by mouth every 6 (six) hours as needed for moderate pain.    Yes [provider]  albuterol (VENTOLIN HFA) 108 (90 Base) MCG/ACT inhaler Inhale 2 puffs into the lungs every 6 (six) hours as needed for wheezing. 01/03/20 01/02/21 Yes Kuneff, Renee A, DO  allopurinol (ZYLOPRIM) 300 MG tablet Take 1 tablet (300 mg total) by mouth daily. 03/07/20  Yes Orson Slick, MD  amLODipine (NORVASC) 10 MG tablet Take 1 tablet (10 mg total) by mouth daily. 12/29/19  Yes Kuneff, Renee A, DO  Cholecalciferol (VITAMIN D3) 25 MCG (1000 UT) CAPS Take 1,000 Units by mouth daily.    Yes [provider]  dicyclomine (BENTYL) 10 MG capsule Take 1 capsule (10 mg  total) by mouth 4 (four) times daily -  before meals and at bedtime. Needs appt for further refills. Patient taking differently: Take 10 mg by mouth 3 (three) times daily as needed for spasms.  11/25/19  Yes Kuneff, Renee A, DO  levothyroxine (SYNTHROID) 75 MCG tablet Take 1 tablet (75 mcg total) by mouth daily. 06/09/19  Yes Kuneff, Renee A, DO  lidocaine-prilocaine (EMLA) cream Apply 1 application topically as needed. 04/12/20  Yes Orson Slick, MD  ondansetron (ZOFRAN) 8 MG tablet Take 1 tablet (8 mg total) by mouth every 8 (eight) hours as needed for nausea or vomiting. 03/07/20  Yes Orson Slick, MD  pantoprazole (PROTONIX) 40 MG tablet Take 1 tablet (40 mg total) by mouth 2 (two) times daily. After 8 weeks take only once daily. 04/01/20 05/27/20 Yes Swayze, Ava, DO  potassium chloride SA (KLOR-CON) 20 MEQ tablet Take 1 tablet (20 mEq total) by mouth daily. 04/26/20  Yes Orson Slick, MD  prochlorperazine (COMPAZINE) 10 MG tablet Take 1 tablet (10 mg total) by mouth every 6 (six) hours as needed for nausea or vomiting. 03/07/20  Yes Orson Slick, MD  rOPINIRole (REQUIP) 0.25 MG tablet Take 1 tablet (0.25 mg total) by mouth at bedtime. 04/06/20  Yes Kuneff, Renee A, DO  sucralfate (CARAFATE) 1  GM/10ML suspension Take 10 mLs (1 g total) by mouth 4 (four) times daily as needed (for throat/mouth burning). 04/18/20  Yes Carlis Stable, NP  vitamin B-12 (CYANOCOBALAMIN) 1000 MCG tablet Take 1,000 mcg by mouth daily.   Yes [provider]    Inpatient Medications:  Current Facility-Administered Medications:  .  acetaminophen (TYLENOL) tablet 650 mg, 650 mg, Oral, Q6H PRN, Roney Jaffe, MD, 650 mg at 05/01/20 1245 .  albuterol (PROVENTIL) (2.5 MG/3ML) 0.083% nebulizer solution 2.5 mg, 2.5 mg, Nebulization, Q6H PRN, Roney Jaffe, MD .  allopurinol (ZYLOPRIM) tablet 300 mg, 300 mg, Oral, Daily, Roney Jaffe, MD, 300 mg at 05/01/20 0925 .  amLODipine (NORVASC) tablet 10 mg, 10 mg, Oral, Daily, Roney Jaffe, MD, 10 mg at 05/01/20 0925 .  bisacodyl (DULCOLAX) suppository 10 mg, 10 mg, Rectal, Daily PRN, Roney Jaffe, MD .  cholecalciferol (VITAMIN D3) tablet 1,000 Units, 1,000 Units, Oral, Daily, Roney Jaffe, MD, 1,000 Units at 05/01/20 0925 .  dextrose 5 %-0.9 % sodium chloride infusion, , Intravenous, Continuous, Manuella Ghazi, Pratik D, DO .  dicyclomine (BENTYL) capsule 10 mg, 10 mg, Oral, TID PRN, Roney Jaffe, MD, 10 mg at 05/01/20 1219 .  levothyroxine (SYNTHROID) tablet 75 mcg, 75 mcg, Oral, Daily, Roney Jaffe, MD, 75 mcg at 05/01/20 [phone] .  ondansetron (ZOFRAN) tablet 4 mg, 4 mg, Oral, Q6H PRN **OR** ondansetron (ZOFRAN) injection 4 mg, 4 mg, Intravenous, Q6H PRN, Roney Jaffe, MD .  pantoprazole (PROTONIX) EC tablet 40 mg, 40 mg, Oral, BID, Roney Jaffe, MD, 40 mg at 05/01/20 0925 .  polyethylene glycol (MIRALAX / GLYCOLAX) packet 17 g, 17 g, Oral, Daily PRN, Roney Jaffe, MD .  rOPINIRole (REQUIP) tablet 0.25 mg, 0.25 mg, Oral, QHS, Roney Jaffe, MD, 0.25 mg at 05/01/20 0049 .  sucralfate (CARAFATE) 1 GM/10ML suspension 1 g, 1 g, Oral, QID PRN, Roney Jaffe, MD .  vitamin B-12 (CYANOCOBALAMIN) tablet 1,000 mcg, 1,000 mcg, Oral, Daily, Roney Jaffe,  MD, 1,000 mcg at 05/01/20 2119 Allergies: Iohexol and Codeine phosphate  Complete Review of Systems: GENERAL: negative for malaise, night sweats HEENT: No changes in hearing or vision, no nose bleeds  or other nasal problems. NECK: Negative for lumps, goiter, pain and significant neck swelling RESPIRATORY: Negative for cough, wheezing CARDIOVASCULAR: Negative for chest pain, leg swelling, palpitations, orthopnea GI: SEE HPI MUSCULOSKELETAL: Negative for joint pain or swelling, back pain, and muscle pain. SKIN: Negative for lesions, rash PSYCH: Negative for sleep disturbance, mood disorder and recent psychosocial stressors. HEMATOLOGY Negative for prolonged bleeding, bruising easily, and swollen nodes. ENDOCRINE: Negative for cold or heat intolerance, polyuria, polydipsia and goiter. NEURO: negative for tremor, gait imbalance, syncope and seizures. The remainder of the review of systems is noncontributory.  Physical Exam: BP (!) 111/41 (BP Location: Right Arm)   Pulse 75   Temp 98.6 F (37 C) (Oral)   Resp 17   Ht _0  (1.626 m)   Wt 59.6 kg   SpO2 98%   BMI 22.55 kg/m  GENERAL: The patient is AO x3, in no acute distress. HEENT: Head is normocephalic and atraumatic. EOMI are intact. Mouth is well hydrated and without lesions. NECK: Supple. No masses LUNGS: Clear to auscultation. No presence of rhonchi/wheezing/rales. Adequate chest expansion HEART: RRR, normal s1 and s2. CHEST: Has porth-a-cath in R side of chest ABDOMEN: tender to palpation in her lower abdomen worse in the LLQ but no guarding, no peritoneal signs, and nondistended. BS +. No masses. EXTREMITIES: Without any cyanosis, clubbing, rash, lesions or edema. NEUROLOGIC: AOx3, no focal motor deficit. SKIN: no jaundice, no rashes  Laboratory Data CBC:     Component Value Date/Time   WBC 3.2 (L) 05/01/2020 0358   RBC 2.96 (L) 05/01/2020 0358   HGB 8.5 (L) 05/01/2020 0934   HGB 10.3 (L) 04/26/2020 0804   HCT 25.6  (L) 05/01/2020 0934   PLT 89 (L) 05/01/2020 0358   PLT 106 (L) 04/26/2020 0804   MCV 82.8 05/01/2020 0358   MCH 27.7 05/01/2020 0358   MCHC 33.5 05/01/2020 0358   RDW 16.1 (H) 05/01/2020 0358   LYMPHSABS 0.1 (L) 04/30/2020 1440   MONOABS 0.2 04/30/2020 1440   EOSABS 0.1 04/30/2020 1440   BASOSABS 0.0 04/30/2020 1440   COAG:  Lab Results  Component Value Date   INR 1.0 04/30/2020   INR 1.1 03/30/2020   INR 1.0 03/13/2020    BMP:  BMP Latest Ref Rng & Units 05/01/2020 04/30/2020 04/26/2020  Glucose 70 - 99 mg/dL 137(H) 160(H) 206(H)  BUN 8 - 23 mg/dL _1 Creatinine 0.44 - 1.00 mg/dL 1.06(H) 1.45(H) 1.38(H)  Sodium 135 - 145 mmol/L 132(L) 133(L) 136  Potassium 3.5 - 5.1 mmol/L 2.9(L) 3.7 3.0(LL)  Chloride 98 - 111 mmol/L 101 101 103  CO2 22 - 32 mmol/L 20(L) 20(L) 19(L)  Calcium 8.9 - 10.3 mg/dL 8.1(L) 9.0 9.7    HEPATIC:  Hepatic Function Latest Ref Rng & Units 04/30/2020 04/26/2020 04/19/2020  Total Protein 6.5 - 8.1 g/dL 6.8 6.9 6.9  Albumin 3.5 - 5.0 g/dL 3.8 3.4(L) 3.4(L)  AST 15 - 41 U/L 19 15 12(L)  ALT 0 - 44 U/L _2 Alk Phosphatase 38 - 126 U/L 50 58 62  Total Bilirubin 0.3 - 1.2 mg/dL 0.7 0.5 0.4  Bilirubin, Direct <=0.2 mg/dL - - -    CARDIAC:  Lab Results  Component Value Date   CKTOTAL 158 11/29/2009   CKMB 2.1 11/29/2009   TROPONINI <0.03 08/05/2018     Imaging: I personally reviewed and interpreted the available imaging.  Assessment & Plan: Elizabeth Mcintyre is a 79 year old female with past medical  history of marginal zone lymphoma, asthma, diabetes, GERD, dysphagia secondary to Schatzki's ring and IBS, who came to the hospital after presenting episodes of rectal bleeding.  The patient has had abdominal pain along with episodes of fresh blood in her stool.  Differential diagnosis given her presentation includes possible diverticulitis but less likely given the absence of fever or leukocytosis, versus ischemic colitis (vascular supply is not typical  for this) versus diverticular bleeding.  It is unlikely that chemotherapy led to these episodes of focal colitis.  For now, we will proceed with colonoscopy tomorrow to evaluate the area thoroughly.  Patient should be monitored for any further drop in her hemoglobin with transfusions as needed.  - Repeat CBC qday, transfuse if Hb <7 - 2 large bore IV lines - Active T/S - Avoid NSAIDs/AC - Will proceed with colonoscopy tomorrow, please keep NPO after MN   Harvel Quale, MD Gastroenterology and Hepatology [facility]   Note: Occasional unusual wording and randomly placed punctuation marks may result from the use of speech recognition technology to transcribe this document

## 2020-05-02 ENCOUNTER — Inpatient Hospital Stay (HOSPITAL_COMMUNITY): Payer: Medicare Other | Admitting: Anesthesiology

## 2020-05-02 ENCOUNTER — Encounter (HOSPITAL_COMMUNITY)
Admission: EM | Disposition: A | Discharge status: Home / Self Care | Payer: Self-pay | Source: Home / Self Care | Attending: Internal Medicine

## 2020-05-02 ENCOUNTER — Telehealth: Payer: Self-pay | Admitting: *Deleted

## 2020-05-02 ENCOUNTER — Inpatient Hospital Stay: Payer: Medicare Other

## 2020-05-02 ENCOUNTER — Encounter (HOSPITAL_COMMUNITY): Payer: Self-pay | Admitting: Nephrology

## 2020-05-02 DIAGNOSIS — E039 Hypothyroidism, unspecified: Secondary | ICD-10-CM

## 2020-05-02 DIAGNOSIS — D122 Benign neoplasm of ascending colon: Secondary | ICD-10-CM

## 2020-05-02 DIAGNOSIS — K633 Ulcer of intestine: Secondary | ICD-10-CM

## 2020-05-02 DIAGNOSIS — K648 Other hemorrhoids: Secondary | ICD-10-CM

## 2020-05-02 DIAGNOSIS — K5730 Diverticulosis of large intestine without perforation or abscess without bleeding: Secondary | ICD-10-CM

## 2020-05-02 DIAGNOSIS — K625 Hemorrhage of anus and rectum: Secondary | ICD-10-CM

## 2020-05-02 HISTORY — PX: COLONOSCOPY WITH PROPOFOL: SHX5780

## 2020-05-02 HISTORY — PX: POLYPECTOMY: SHX5525

## 2020-05-02 HISTORY — PX: BIOPSY: SHX5522

## 2020-05-02 LAB — BASIC METABOLIC PANEL
Anion gap: 7 (ref 5–15)
BUN: 6 mg/dL — ABNORMAL LOW (ref 8–23)
CO2: 18 mmol/L — ABNORMAL LOW (ref 22–32)
Calcium: 8 mg/dL — ABNORMAL LOW (ref 8.9–10.3)
Chloride: 106 mmol/L (ref 98–111)
Creatinine, Ser: 0.94 mg/dL (ref 0.44–1.00)
GFR calc Af Amer: >60 mL/min (ref >60)
GFR calc non Af Amer: 58 mL/min — ABNORMAL LOW (ref >60)
Glucose, Bld: 146 mg/dL — ABNORMAL HIGH (ref 70–99)
Potassium: 3 mmol/L — ABNORMAL LOW (ref 3.5–5.1)
Sodium: 131 mmol/L — ABNORMAL LOW (ref 135–145)

## 2020-05-02 LAB — CBC
HCT: 23.4 % — ABNORMAL LOW (ref 36.0–46.0)
Hemoglobin: 7.9 g/dL — ABNORMAL LOW (ref 12.0–15.0)
MCH: 28 pg (ref 26.0–34.0)
MCHC: 33.8 g/dL (ref 30.0–36.0)
MCV: 83 fL (ref 80.0–100.0)
Platelets: 82 10*3/uL — ABNORMAL LOW (ref 150–400)
RBC: 2.82 MIL/uL — ABNORMAL LOW (ref 3.87–5.11)
RDW: 16.1 % — ABNORMAL HIGH (ref 11.5–15.5)
WBC: 1.6 10*3/uL — ABNORMAL LOW (ref 4.0–10.5)
nRBC: 0 % (ref 0.0–0.2)

## 2020-05-02 LAB — GLUCOSE, CAPILLARY: Glucose-Capillary: 149 mg/dL — ABNORMAL HIGH (ref 70–99)

## 2020-05-02 LAB — MAGNESIUM: Magnesium: 1.1 mg/dL — ABNORMAL LOW (ref 1.7–2.4)

## 2020-05-02 SURGERY — COLONOSCOPY WITH PROPOFOL
Anesthesia: General

## 2020-05-02 MED ORDER — PROPOFOL 10 MG/ML IV BOLUS
INTRAVENOUS | Status: AC
  Filled 2020-05-02: qty 40

## 2020-05-02 MED ORDER — PROPOFOL 500 MG/50ML IV EMUL
INTRAVENOUS | Status: DC | PRN
  Administered 2020-05-02: 60 mg via INTRAVENOUS
  Administered 2020-05-02: 125 ug/kg/min via INTRAVENOUS

## 2020-05-02 MED ORDER — LACTATED RINGERS IV SOLN
Freq: Once | INTRAVENOUS | Status: AC
  Administered 2020-05-02: 1000 mL via INTRAVENOUS

## 2020-05-02 MED ORDER — POTASSIUM CHLORIDE 10 MEQ/100ML IV SOLN
10.0000 meq | INTRAVENOUS | Status: AC
  Administered 2020-05-02 – 2020-05-03 (×4): 10 meq via INTRAVENOUS
  Filled 2020-05-02 (×4): qty 100

## 2020-05-02 MED ORDER — LACTATED RINGERS IV SOLN: INTRAVENOUS | Status: DC | PRN

## 2020-05-02 MED ORDER — PEG 3350-KCL-NA BICARB-NACL 420 G PO SOLR
4000.0000 mL | Freq: Once | ORAL | Status: AC
  Administered 2020-05-02: 4000 mL via ORAL

## 2020-05-02 MED ORDER — CIPROFLOXACIN IN D5W 400 MG/200ML IV SOLN
400.0000 mg | Freq: Two times a day (BID) | INTRAVENOUS | Status: DC
  Administered 2020-05-02 – 2020-05-04 (×4): 400 mg via INTRAVENOUS
  Filled 2020-05-02 (×4): qty 200

## 2020-05-02 MED ORDER — MAGIC MOUTHWASH W/LIDOCAINE
15.0000 mL | Freq: Three times a day (TID) | ORAL | Status: DC
  Administered 2020-05-02 – 2020-05-05 (×8): 15 mL via ORAL
  Filled 2020-05-02 (×9): qty 15

## 2020-05-02 MED ORDER — MAGNESIUM SULFATE 4 GM/100ML IV SOLN
4.0000 g | Freq: Once | INTRAVENOUS | Status: AC
  Administered 2020-05-02: 4 g via INTRAVENOUS
  Filled 2020-05-02: qty 100

## 2020-05-02 MED ORDER — SODIUM CHLORIDE 0.9 % IV SOLN: INTRAVENOUS | Status: DC

## 2020-05-02 MED ORDER — POTASSIUM CHLORIDE CRYS ER 20 MEQ PO TBCR
40.0000 meq | EXTENDED_RELEASE_TABLET | Freq: Once | ORAL | Status: AC
  Administered 2020-05-02: 40 meq via ORAL
  Filled 2020-05-02: qty 2

## 2020-05-02 MED ORDER — METRONIDAZOLE IN NACL 5-0.79 MG/ML-% IV SOLN
500.0000 mg | Freq: Three times a day (TID) | INTRAVENOUS | Status: DC
  Administered 2020-05-02 – 2020-05-04 (×6): 500 mg via INTRAVENOUS
  Filled 2020-05-02 (×7): qty 100

## 2020-05-02 MED ORDER — STERILE WATER FOR IRRIGATION IR SOLN
Status: DC | PRN
  Administered 2020-05-02: 1.5 mL

## 2020-05-02 NOTE — Brief Op Note (Signed)
04/30/2020 - 05/02/2020  3:50 PM  PATIENT:  Elizabeth Mcintyre  79 y.o. female  PRE-OPERATIVE DIAGNOSIS:  rectal bleeding  POST-OPERATIVE DIAGNOSIS:  diverticulosis, ascending colon polyps x2 (cold snare), sigmoid ulcerations and inflammation from 10-25cm hemorrhoids,   PROCEDURE:  Procedure(s): COLONOSCOPY WITH PROPOFOL (N/A) POLYPECTOMY BIOPSY  SURGEON:  Surgeon(s) and Role:    * Harvel Quale, MD - Primary   Patient underwent colonoscopy today.  She was found to have severe ulceration with debris attached to colonic mucosa in the sigmoid colon extending from 10 to 25 cm.  There was presence of adjacent erythema and edema.  This was biopsied.  No other alterations were evidenced throughout the colon.  Also, there was presence of 2 polyps in the ascending colon which were removed with cold forceps and cold snare.  No active bleeding was observed throughout the procedure.  Recs: - Advance diet as tolerated.  - Perform CT angio or MRA abdomen/pelvis today - F/u path report - C/w antibiotics for 10 days - Keep MAP >65 mm Hg  Maylon Peppers, MD Gastroenterology and Hepatology [facility]

## 2020-05-02 NOTE — Op Note (Addendum)
[facility] Patient Name: Elizabeth Mcintyre Procedure Date: 05/02/2020 1:20 PM MRN: [id number] Date of Birth: 10-04-1940 Attending MD: Maylon Peppers ,  CSN: [id number] Age: 79 Admit Type: Inpatient Procedure:                Colonoscopy Indications:              Hematochezia Providers:                Maylon Peppers, Jeanann Lewandowsky. Sharon Seller, RN, Caprice Kluver, Raphael Gibney, Technician Referring MD:              Medicines:                Monitored Anesthesia Care Complications:            No immediate complications. Estimated Blood Loss:     Estimated blood loss: none. Procedure:                Pre-Anesthesia Assessment:                           - Prior to the procedure, a History and Physical                            was performed, and patient medications, allergies                            and sensitivities were reviewed. The patient's                            tolerance of previous anesthesia was reviewed.                           - The risks and benefits of the procedure and the                            sedation options and risks were discussed with the                            patient. All questions were answered and informed                            consent was obtained.                           - ASA Grade Assessment: III - A patient with severe                            systemic disease.                           After obtaining informed consent, the colonoscope                            was passed under direct vision. Throughout the  procedure, the patient's blood pressure, pulse, and                            oxygen saturations were monitored continuously. The                            [id number] ([id number]) was introduced through the                            anus and advanced to the the terminal ileum. The                            colonoscopy was performed without difficulty. The                             patient tolerated the procedure well. The quality                            of the bowel preparation was good. Scope withdrawal                            time was 10 minutes. Scope In: 2:18:32 PM Scope Out: 2:48:16 PM Scope Withdrawal Time: 0 hours 17 minutes 17 seconds  Total Procedure Duration: 0 hours 29 minutes 44 seconds  Findings:      The perianal and digital rectal examinations were normal.      A 8 mm polyp was found in the ascending colon. The polyp was sessile.       The polyp was removed with a cold snare. Resection and retrieval were       complete.      A 2 mm polyp was found in the ascending colon. The polyp was sessile.       The polyp was removed with a cold biopsy forceps. Resection and       retrieval were complete.      Multiple small and large-mouthed diverticula were found in the sigmoid       colon, descending colon and transverse colon.      A continuous area of nonbleeding ulcerated mucosa with adherent fibrin       and erythema with edema was present in the sigmoid colon between 10-25       cm from the incisors. These findings were concerning for possible       colonic ischemia. Biopsies were taken with a cold forceps for histology.      Non-bleeding internal hemorrhoids were found during retroflexion. The       hemorrhoids were small. Impression:               - One 8 mm polyp in the ascending colon, removed                            with a cold snare. Resected and retrieved.                           - One 2 mm polyp in the ascending colon, removed  with a cold biopsy forceps. Resected and retrieved.                           - Diverticulosis in the sigmoid colon, in the                            descending colon and in the transverse colon.                           - Mucosal ulceration in the colon and inflammation.                            Biopsied.                           - Non-bleeding internal hemorrhoids. Moderate  Sedation:      Per Anesthesia Care Recommendation:           - Return patient to hospital ward for ongoing care.                           - Advance diet as tolerated.                           - Perform CT angio abdomen/pelvis today - may need                            predmedication for allergy.                           - Await pathology results.                           - Repeat colonoscopy is not recommended due to                            current age (76 years or older) for screening                            purposes.                           - C/w antibiotics for 10 days. Procedure Code(s):        --- Professional ---                           [phone], GC, Colonoscopy, flexible; with removal of                            tumor(s), polyp(s), or other lesion(s) by snare                            technique                           24580, 58, Colonoscopy, flexible; with biopsy,  single or multiple Diagnosis Code(s):        --- Professional ---                           K63.5, Polyp of colon                           K64.8, Other hemorrhoids                           K63.3, Ulcer of intestine                           K92.1, Melena (includes Hematochezia)                           K57.30, Diverticulosis of large intestine without                            perforation or abscess without bleeding CPT copyright 2019 American Medical Association. All rights reserved. The codes documented in this report are preliminary and upon coder review may  be revised to meet current compliance requirements. Maylon Peppers, MD Maylon Peppers,  05/02/2020 3:27:18 PM This report has been signed electronically. Number of Addenda: 0

## 2020-05-02 NOTE — Anesthesia Preprocedure Evaluation (Addendum)
Anesthesia Evaluation  Patient identified by MRN, date of birth, ID band Patient awake    Reviewed: Allergy & Precautions, NPO status , Patient's Chart, lab work & pertinent test results  History of Anesthesia Complications (+) PONV and history of anesthetic complications  Airway Mallampati: II  TM Distance: >3 FB Neck ROM: Full    Dental  (+) Dental Advisory Given, Teeth Intact   Pulmonary asthma ,    Pulmonary exam normal breath sounds clear to auscultation       Cardiovascular hypertension, Pt. on medications  Rhythm:Regular Rate:Normal  30-Apr-2020 14:05:46 [facility] ROUTINE RECORD Sinus tachycardia   Neuro/Psych  Headaches, PSYCHIATRIC DISORDERS Anxiety Depression CVA    GI/Hepatic GERD  Medicated and Controlled,  Endo/Other  diabetes, Well Controlled, Type 2Hypothyroidism   Renal/GU Renal InsufficiencyRenal disease     Musculoskeletal  (+) Arthritis , Rheumatoid disorders,    Abdominal   Peds  Hematology  (+) Blood dyscrasia (thrombocytopenia ), anemia ,   Anesthesia Other Findings   Reproductive/Obstetrics                           Anesthesia Physical Anesthesia Plan  ASA: IV  Anesthesia Plan: General   Post-op Pain Management:    Induction: Intravenous  PONV Risk Score and Plan: TIVA  Airway Management Planned: Nasal Cannula, Simple Face Mask and Natural Airway  Additional Equipment:   Intra-op Plan:   Post-operative Plan:   Informed Consent: I have reviewed the patients History and Physical, chart, labs and discussed the procedure including the risks, benefits and alternatives for the proposed anesthesia with the patient or authorized representative who has indicated his/her understanding and acceptance.     Dental advisory given  Plan Discussed with: CRNA and Surgeon  Anesthesia Plan Comments:        Anesthesia Quick Evaluation

## 2020-05-02 NOTE — Telephone Encounter (Signed)
Received call from pt's daughter, Brendia Sacks. She is calling to say that her mother is still in the hospital. She is to have a colonoscopy today as she was still passing blood per rectum yesterday. She was to be here this am for iron infusion. [name] is hoping they will give her the iron while she is in the hospital. [name] states she will keep [place] up to date.

## 2020-05-02 NOTE — Consult Note (Signed)
Fadi Menter Castaneda, M.D. Gastroenterology & Hepatology                                           Patient Name: Elizabeth Mcintyre Account #: @[id number]   MRN: [id number] Admission Date: 04/30/2020 Date of Evaluation:  05/01/2020 Time of Evaluation: 3:09 PM   Referring Physician: Dr. Shah  Chief Complaint:  Rectal bleeding HPI:  This is a 79-year-old female with past medical history of marginal zone lymphoma, asthma, diabetes, GERD, dysphagia secondary to Schatzki's ring and IBS, who came to the hospital after presenting episodes of rectal bleeding.  The patient reports that since Sunday morning she presented some episodes of lower abdominal cramping intermittently which were followed by episodes of large fresh blood in her stool.  She reported that she has presented these episodes of bleeding several times, today they were less frequent than before.  She endorses each bowel movement had a tablespoon of blood.  She did not notice some hematochezia today which was not present the prior days.  Has not seen any melena.  She denies taking any NSAIDs or anticoagulants.  Never had similar symptoms in the past.  She reports that she has had 2 cycles of chemotherapy so far, with most recent one on Friday.  She is currently receiving Bendamustine/Rituximab.  States feeling slightly nauseated but has not vomited. The patient denies having any fever, chills, hematochezia, melena, hematemesis, abdominal distention,  diarrhea, jaundice, pruritus or major weight loss.  Notably, she follows with [facility] for management of episodes of dysphagia secondary to Schatzki's ring.  Also endorsed having intermittent episodes diarrhea.  Most recent EGD was performed 03/30/2020 where she was found to have a nonobstructing Schatzki ring and presence of erosive esophagitis extending 15 cm but no presence of ulcers.  Her most recent colonoscopy was performed in 2012 when she was found to have diverticulosis.  In the ED, she was HD stable  and afebrile. Labs were remarkable for hemoglobin of 10.6 with MCV of 83, normal white blood cell count but mild thrombocytopenia of 115, presence of elevated ferritin of 1053 with rest of iron stores within normal limits, CMP showing BUN of 21 and creatinine 1.45 electrolytes within normal limits, normal liver panel, INR was 1.0, lactic acid was 2.3 elevated.  Today her most recent hemoglobin was 8.2 with persistently low platelet count of 89,000 and white blood cell count of 3.2.  Her potassium was 2.9.  CT of the abdomen and pelvis without IV contrast showed severe thickening of the mid sigmoid colon with some presence of fat stranding of unclear etiology.  Social: neg smoking, alcohol or illicit drug use Surgical: non contributory  Past Medical History: SEE CHRONIC ISSSUES: Past Medical History:  Diagnosis Date  . Asthma   . Bloating 04/26/2019  . Cancer (HCC) 2021   Lymphoma  . 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 DIABETES MELLITUS, TYPE II 11/09/2007   diet control  . Diverticulosis 03/2011  . GERD (gastroesophageal reflux disease)    none recently  . GI bleed   . History of rheumatoid arthritis    during 30's, was treated.  . Hyperlipidemia   . Hypertension   . IBS (irritable bowel syndrome)   . Osteopenia 2017   Last  bone density 05/04/2017: -2.4  . PONV (postoperative nausea and   vomiting)   . Stress incontinence   . Stroke (HCC)    mini stroke - found on a CT scan   Past Surgical History:  Past Surgical History:  Procedure Laterality Date  . ABDOMINAL HYSTERECTOMY    . BIOPSY  03/30/2020   Procedure: BIOPSY;  Surgeon: Buccini, Robert, MD;  Location: [facility];  Service: Endoscopy;;  . CARDIAC CATHETERIZATION     X 2, last one in 1998  . CHOLECYSTECTOMY N/A 04/13/2017   Procedure: LAPAROSCOPIC CHOLECYSTECTOMY;  Surgeon: Jenkins, Mark, MD;  Location: [facility];  Service: General;  Laterality: N/A;  . COLONOSCOPY    .  COLONOSCOPY  May 2012   Dr. Brodie: mild diverticulosis, otherwise normal.   . ESOPHAGOGASTRODUODENOSCOPY N/A 01/28/2015   Dr. Rourk: reflux esophagitis, Schatzki's ring not manipulated due to recent bleeding  . ESOPHAGOGASTRODUODENOSCOPY N/A 03/30/2015   Dr. Rourk: Schatzki's ring s/p Maloney dilation, previously noted esophageal ulcer completely healed  . ESOPHAGOGASTRODUODENOSCOPY N/A 03/30/2020   Procedure: ESOPHAGOGASTRODUODENOSCOPY (EGD);  Surgeon: Buccini, Robert, MD;  Location: [facility];  Service: Endoscopy;  Laterality: N/A;  . IR IMAGING GUIDED PORT INSERTION  03/13/2020  . LYMPH NODE BIOPSY Left 03/20/2020   Procedure: LEFT POSTERIOR CERVICAL LYMPH NODE BIOPSY;  Surgeon: Thompson, Burke, MD;  Location: [facility];  Service: General;  Laterality: Left;  . MALONEY DILATION N/A 03/30/2015   Procedure: MALONEY DILATION;  Surgeon: Robert M Rourk, MD;  Location: [facility];  Service: Endoscopy;  Laterality: N/A;   Family History:  Family History  Problem Relation Age of Onset  . Heart disease Mother   . Osteoarthritis Mother   . Sudden death Father   . Single kidney Father   . Other Father        h/o severe MVA injuries  . Hyperlipidemia Sister   . Other Daughter        Myalgias  . Fibromyalgia Daughter   . Allergies Daughter   . Heart disease Maternal Grandfather   . Sudden death Paternal Grandmother   . Diabetes Paternal Grandfather   . Heart disease Daughter   . Other Daughter        palpitations  . Pulmonary fibrosis Maternal Aunt   . Cancer Paternal Uncle   . Pulmonary fibrosis Maternal Aunt   . Colon cancer Neg Hx    Social History:  Social History   Tobacco Use  . Smoking status: Never Smoker  . Smokeless tobacco: Never Used  Vaping Use  . Vaping Use: Never used  Substance Use Topics  . Alcohol use: No  . Drug use: No    Home Medications:  Prior to Admission medications   Medication Sig Start Date End Date Taking? Authorizing Provider  acetaminophen  (TYLENOL) 325 MG tablet Take 650 mg by mouth every 6 (six) hours as needed for moderate pain.    Yes [provider]  albuterol (VENTOLIN HFA) 108 (90 Base) MCG/ACT inhaler Inhale 2 puffs into the lungs every 6 (six) hours as needed for wheezing. 01/03/20 01/02/21 Yes Kuneff, Renee A, DO  allopurinol (ZYLOPRIM) 300 MG tablet Take 1 tablet (300 mg total) by mouth daily. 03/07/20  Yes Dorsey, John T IV, MD  amLODipine (NORVASC) 10 MG tablet Take 1 tablet (10 mg total) by mouth daily. 12/29/19  Yes Kuneff, Renee A, DO  Cholecalciferol (VITAMIN D3) 25 MCG (1000 UT) CAPS Take 1,000 Units by mouth daily.    Yes [provider]  dicyclomine (BENTYL) 10 MG capsule Take 1 capsule (10 mg   total) by mouth 4 (four) times daily -  before meals and at bedtime. Needs appt for further refills. Patient taking differently: Take 10 mg by mouth 3 (three) times daily as needed for spasms.  11/25/19  Yes Kuneff, Renee A, DO  levothyroxine (SYNTHROID) 75 MCG tablet Take 1 tablet (75 mcg total) by mouth daily. 06/09/19  Yes Kuneff, Renee A, DO  lidocaine-prilocaine (EMLA) cream Apply 1 application topically as needed. 04/12/20  Yes Dorsey, John T IV, MD  ondansetron (ZOFRAN) 8 MG tablet Take 1 tablet (8 mg total) by mouth every 8 (eight) hours as needed for nausea or vomiting. 03/07/20  Yes Dorsey, John T IV, MD  pantoprazole (PROTONIX) 40 MG tablet Take 1 tablet (40 mg total) by mouth 2 (two) times daily. After 8 weeks take only once daily. 04/01/20 05/27/20 Yes Swayze, Ava, DO  potassium chloride SA (KLOR-CON) 20 MEQ tablet Take 1 tablet (20 mEq total) by mouth daily. 04/26/20  Yes Dorsey, John T IV, MD  prochlorperazine (COMPAZINE) 10 MG tablet Take 1 tablet (10 mg total) by mouth every 6 (six) hours as needed for nausea or vomiting. 03/07/20  Yes Dorsey, John T IV, MD  rOPINIRole (REQUIP) 0.25 MG tablet Take 1 tablet (0.25 mg total) by mouth at bedtime. 04/06/20  Yes Kuneff, Renee A, DO  sucralfate (CARAFATE) 1  GM/10ML suspension Take 10 mLs (1 g total) by mouth 4 (four) times daily as needed (for throat/mouth burning). 04/18/20  Yes Gill, Eric A, NP  vitamin B-12 (CYANOCOBALAMIN) 1000 MCG tablet Take 1,000 mcg by mouth daily.   Yes [provider]    Inpatient Medications:  Current Facility-Administered Medications:  .  acetaminophen (TYLENOL) tablet 650 mg, 650 mg, Oral, Q6H PRN, Schertz, Robert, MD, 650 mg at 05/01/20 1245 .  albuterol (PROVENTIL) (2.5 MG/3ML) 0.083% nebulizer solution 2.5 mg, 2.5 mg, Nebulization, Q6H PRN, Schertz, Robert, MD .  allopurinol (ZYLOPRIM) tablet 300 mg, 300 mg, Oral, Daily, Schertz, Robert, MD, 300 mg at 05/01/20 0925 .  amLODipine (NORVASC) tablet 10 mg, 10 mg, Oral, Daily, Schertz, Robert, MD, 10 mg at 05/01/20 0925 .  bisacodyl (DULCOLAX) suppository 10 mg, 10 mg, Rectal, Daily PRN, Schertz, Robert, MD .  cholecalciferol (VITAMIN D3) tablet 1,000 Units, 1,000 Units, Oral, Daily, Schertz, Robert, MD, 1,000 Units at 05/01/20 0925 .  dextrose 5 %-0.9 % sodium chloride infusion, , Intravenous, Continuous, Shah, Pratik D, DO .  dicyclomine (BENTYL) capsule 10 mg, 10 mg, Oral, TID PRN, Schertz, Robert, MD, 10 mg at 05/01/20 1219 .  levothyroxine (SYNTHROID) tablet 75 mcg, 75 mcg, Oral, Daily, Schertz, Robert, MD, 75 mcg at 05/01/20 0633 .  ondansetron (ZOFRAN) tablet 4 mg, 4 mg, Oral, Q6H PRN **OR** ondansetron (ZOFRAN) injection 4 mg, 4 mg, Intravenous, Q6H PRN, Schertz, Robert, MD .  pantoprazole (PROTONIX) EC tablet 40 mg, 40 mg, Oral, BID, Schertz, Robert, MD, 40 mg at 05/01/20 0925 .  polyethylene glycol (MIRALAX / GLYCOLAX) packet 17 g, 17 g, Oral, Daily PRN, Schertz, Robert, MD .  rOPINIRole (REQUIP) tablet 0.25 mg, 0.25 mg, Oral, QHS, Schertz, Robert, MD, 0.25 mg at 05/01/20 0049 .  sucralfate (CARAFATE) 1 GM/10ML suspension 1 g, 1 g, Oral, QID PRN, Schertz, Robert, MD .  vitamin B-12 (CYANOCOBALAMIN) tablet 1,000 mcg, 1,000 mcg, Oral, Daily, Schertz, Robert,  MD, 1,000 mcg at 05/01/20 0925 Allergies: Iohexol and Codeine phosphate  Complete Review of Systems: GENERAL: negative for malaise, night sweats HEENT: No changes in hearing or vision, no nose bleeds   or other nasal problems. NECK: Negative for lumps, goiter, pain and significant neck swelling RESPIRATORY: Negative for cough, wheezing CARDIOVASCULAR: Negative for chest pain, leg swelling, palpitations, orthopnea GI: SEE HPI MUSCULOSKELETAL: Negative for joint pain or swelling, back pain, and muscle pain. SKIN: Negative for lesions, rash PSYCH: Negative for sleep disturbance, mood disorder and recent psychosocial stressors. HEMATOLOGY Negative for prolonged bleeding, bruising easily, and swollen nodes. ENDOCRINE: Negative for cold or heat intolerance, polyuria, polydipsia and goiter. NEURO: negative for tremor, gait imbalance, syncope and seizures. The remainder of the review of systems is noncontributory.  Physical Exam: BP (!) 111/41 (BP Location: Right Arm)   Pulse 75   Temp 98.6 F (37 C) (Oral)   Resp 17   Ht 5' 4" (1.626 m)   Wt 59.6 kg   SpO2 98%   BMI 22.55 kg/m  GENERAL: The patient is AO x3, in no acute distress. HEENT: Head is normocephalic and atraumatic. EOMI are intact. Mouth is well hydrated and without lesions. NECK: Supple. No masses LUNGS: Clear to auscultation. No presence of rhonchi/wheezing/rales. Adequate chest expansion HEART: RRR, normal s1 and s2. CHEST: Has porth-a-cath in R side of chest ABDOMEN: tender to palpation in her lower abdomen worse in the LLQ but no guarding, no peritoneal signs, and nondistended. BS +. No masses. EXTREMITIES: Without any cyanosis, clubbing, rash, lesions or edema. NEUROLOGIC: AOx3, no focal motor deficit. SKIN: no jaundice, no rashes  Laboratory Data CBC:     Component Value Date/Time   WBC 3.2 (L) 05/01/2020 0358   RBC 2.96 (L) 05/01/2020 0358   HGB 8.5 (L) 05/01/2020 0934   HGB 10.3 (L) 04/26/2020 0804   HCT 25.6  (L) 05/01/2020 0934   PLT 89 (L) 05/01/2020 0358   PLT 106 (L) 04/26/2020 0804   MCV 82.8 05/01/2020 0358   MCH 27.7 05/01/2020 0358   MCHC 33.5 05/01/2020 0358   RDW 16.1 (H) 05/01/2020 0358   LYMPHSABS 0.1 (L) 04/30/2020 1440   MONOABS 0.2 04/30/2020 1440   EOSABS 0.1 04/30/2020 1440   BASOSABS 0.0 04/30/2020 1440   COAG:  Lab Results  Component Value Date   INR 1.0 04/30/2020   INR 1.1 03/30/2020   INR 1.0 03/13/2020    BMP:  BMP Latest Ref Rng & Units 05/01/2020 04/30/2020 04/26/2020  Glucose 70 - 99 mg/dL 137(H) 160(H) 206(H)  BUN 8 - 23 mg/dL 16 21 11  Creatinine 0.44 - 1.00 mg/dL 1.06(H) 1.45(H) 1.38(H)  Sodium 135 - 145 mmol/L 132(L) 133(L) 136  Potassium 3.5 - 5.1 mmol/L 2.9(L) 3.7 3.0(LL)  Chloride 98 - 111 mmol/L 101 101 103  CO2 22 - 32 mmol/L 20(L) 20(L) 19(L)  Calcium 8.9 - 10.3 mg/dL 8.1(L) 9.0 9.7    HEPATIC:  Hepatic Function Latest Ref Rng & Units 04/30/2020 04/26/2020 04/19/2020  Total Protein 6.5 - 8.1 g/dL 6.8 6.9 6.9  Albumin 3.5 - 5.0 g/dL 3.8 3.4(L) 3.4(L)  AST 15 - 41 U/L 19 15 12(L)  ALT 0 - 44 U/L 13 8 7  Alk Phosphatase 38 - 126 U/L 50 58 62  Total Bilirubin 0.3 - 1.2 mg/dL 0.7 0.5 0.4  Bilirubin, Direct <=0.2 mg/dL - - -    CARDIAC:  Lab Results  Component Value Date   CKTOTAL 158 11/29/2009   CKMB 2.1 11/29/2009   TROPONINI <0.03 08/05/2018     Imaging: I personally reviewed and interpreted the available imaging.  Assessment & Plan: Elizabeth Mcintyre is a 79-year-old female with past medical   history of marginal zone lymphoma, asthma, diabetes, GERD, dysphagia secondary to Schatzki's ring and IBS, who came to the hospital after presenting episodes of rectal bleeding.  The patient has had abdominal pain along with episodes of fresh blood in her stool.  Differential diagnosis given her presentation includes possible diverticulitis but less likely given the absence of fever or leukocytosis, versus ischemic colitis (vascular supply is not typical  for this) versus diverticular bleeding.  It is unlikely that chemotherapy led to these episodes of focal colitis.  For now, we will proceed with colonoscopy tomorrow to evaluate the area thoroughly.  Patient should be monitored for any further drop in her hemoglobin with transfusions as needed.  - Repeat CBC qday, transfuse if Hb <7 - 2 large bore IV lines - Active T/S - Avoid NSAIDs/AC - Will proceed with colonoscopy tomorrow, please keep NPO after MN   Elizabeth Mehta Castaneda Mayorga, MD Gastroenterology and Hepatology [facility]   Note: Occasional unusual wording and randomly placed punctuation marks may result from the use of speech recognition technology to transcribe this document  

## 2020-05-02 NOTE — H&P (Signed)
No Glycocalyx/Miralax container 255g by RN Hebert Soho ordered

## 2020-05-02 NOTE — Anesthesia Postprocedure Evaluation (Signed)
Anesthesia Post Note  Patient: Elizabeth Mcintyre  Procedure(s) Performed: COLONOSCOPY WITH PROPOFOL (N/A ) POLYPECTOMY BIOPSY  Patient location during evaluation: PACU Anesthesia Type: General Level of consciousness: awake, oriented, awake and alert and patient cooperative Pain management: pain level controlled Vital Signs Assessment: post-procedure vital signs reviewed and stable Respiratory status: spontaneous breathing, respiratory function stable and nonlabored ventilation Cardiovascular status: blood pressure returned to baseline and stable Postop Assessment: no headache and no backache Anesthetic complications: no   No complications documented.   Last Vitals:  Vitals:   05/02/20 1245 05/02/20 1351  BP: (!) 136/58 132/68  Pulse: 86 84  Resp: 16 (!) 22  Temp: 36.7 C 36.7 C  SpO2: 100% 100%    Last Pain:  Vitals:   05/02/20 1415  TempSrc:   PainSc: 0-No pain                 Tacy Learn

## 2020-05-02 NOTE — Progress Notes (Signed)
PROGRESS NOTE    Elizabeth Mcintyre  OVF:[id number] DOB: 1941/03/27 DOA: 04/30/2020 PCP: Ma Hillock, DO    Brief Narrative:  Per HPI: The patient is a83 y.o.year-old w/ hx of CVA, HTN, HL, GI bleed, hx RA, DM2, anxiety/ depression, hx lymphoma 2021 currently on chemoRx. Pt presented today for rectal bleeding that started last night. She has had about 4 episodes of pain in the lower abdomen then passing 2 - 3 tbsp's of bright red blood per rectum. Not mixed in with feces. No voiding issues. No diarrhea or fevers. +lightheaded and fatigued than usual. Had 1st rd of chemo 1 mo ago and required admit afterwards for hematemesis, EGD showed esophagitis. Last chemo infusion was 04/26/20. ED spoke w/ GI on call , they recommend admission and they will see in am, tentatively planning for colonoscopy.   Assessment & Plan:   Principal Problem:   Lower GI bleeding Active Problems:   Essential hypertension, benign   Acquired hypothyroidism   Gastroesophageal reflux disease with esophagitis   CKD stage 3 secondary to diabetes (HCC)   Marginal zone lymphoma (HCC)   GI bleed   Anemia, mild   Active chemotherapy   Mild acute blood loss anemia suspected secondary to lower GI bleed -Mid sigmoid thickening per CT scan of unknown significance -Hemoglobin downward trending with plans to transfuse with hemoglobin less than 7 -Avoid anticoagulation -Appreciate GI evaluation -PPI twice daily  -Continue IV fluid and clear liquid diet for now -Plan for IV iron infusion this week at [facility]; may consider infusion while inpatient if no sign of infection noted -Patient had colonoscopy today which showed signs concerning for ischemic colitis. -Recommendations referred CT angiogram of abdomen -Discussed possible dye allergy with patient who reports that the last time she received she did not break out in hives, become short of breath, wheeze or feel that her throat was closing.  She did have some  discomfort in her throat, but reported this was present prior to receiving dye and felt as though it may be related to reflux.  Hypokalemia -Replete, magnesium was also low we replaced  Mild hyponatremia -Change IV fluid to D5 NS  Prior history of esophagitis/hematemesis -Recent EGD with esophagitis -Continue PPI twice daily and Carafate  CKD stage IIIa -Creatinine appears to be 1.3-1.6 previously, but is 1.06  Hypertension-stable -Continue Norvasc and monitor  Hypothyroidism -Continue Synthroid  NHL stage IIIa -Started on chemo with bendamustine/Rituxan 5 weeks ago -Second round was 5 days ago on 8/12  Pancytopenia -Likely related to recent chemotherapy -Continue to follow CBC   DVT prophylaxis: SCDs Start: 04/30/20 2145  Code Status: Full code Family Communication: Discussed with daughter at the bedside Disposition Plan: Status is: Inpatient  Remains inpatient appropriate because:Ongoing diagnostic testing needed not appropriate for outpatient work up   Dispo: The patient is from: Home              Anticipated d/c is to: Home              Anticipated d/c date is: 1 day              Patient currently is not medically stable to d/c.         Consultants:   Gastroenterology  Procedures:  Colonoscopy:- One 8 mm polyp in the ascending colon, removed  with a cold snare. Resected and retrieved.                           - One 2 mm polyp in the ascending colon, removed                            with a cold biopsy forceps. Resected and retrieved.                           - Diverticulosis in the sigmoid colon, in the                            descending colon and in the transverse colon.                           - Mucosal ulceration in the colon and inflammation.                            Biopsied.                            - Non-bleeding internal hemorrhoids  Antimicrobials:   Ciprofloxacin 8/18 >  Flagyl 8/18  >   Subjective: Currently, no nausea or vomiting.  No shortness of breath or chest pain.  Objective: Vitals:   05/02/20 1500 05/02/20 1515 05/02/20 1530 05/02/20 2101  BP: (!) 132/53 102/79 (!) 119/59 (!) 132/45  Pulse: 86 80 81 84  Resp: 19 (!) 21 16 16   Temp:    (!) 101.2 F (38.4 C)  TempSrc:    Oral  SpO2: 98%  100% 100%  Weight:      Height:        Intake/Output Summary (Last 24 hours) at 05/02/2020 2102 Last data filed at 05/02/2020 1600 Gross per 24 hour  Intake 3030.62 ml  Output 1150 ml  Net 1880.62 ml   Filed Weights   04/30/20 1329 05/01/20 0400 05/02/20 0500  Weight: 60.8 kg 59.6 kg 60.7 kg    Examination:  General exam: Appears calm and comfortable  Respiratory system: Clear to auscultation. Respiratory effort normal. Cardiovascular system: S1 & S2 heard, RRR. No JVD, murmurs, rubs, gallops or clicks. No pedal edema. Gastrointestinal system: Abdomen is nondistended, soft and nontender. No organomegaly or masses felt. Normal bowel sounds heard. Central nervous system: Alert and oriented. No focal neurological deficits. Extremities: Symmetric 5 x 5 power. Skin: No rashes, lesions or ulcers Psychiatry: Judgement and insight appear normal. Mood & affect appropriate.     Data Reviewed: I have personally reviewed following labs and imaging studies  CBC: Recent Labs  Lab 04/26/20 0804 04/26/20 0804 04/30/20 1440 04/30/20 2212 05/01/20 0358 05/01/20 0934 05/02/20 0651  WBC 3.9*  --  4.4  --  3.2*  --  1.6*  NEUTROABS 2.5  --  4.0  --   --   --   --   HGB 10.3*  --  10.6* 9.0* 8.2* 8.5* 7.9*  HCT 29.8*   < > 31.7* 26.6* 24.5* 25.6* 23.4*  MCV 80.1  --  83.2  --  82.8  --  83.0  PLT 106*  --  115*  --  89*  --  82*   < > =  values in this interval not displayed.   Basic Metabolic Panel: Recent Labs  Lab 04/26/20 0804 04/30/20 1440 05/01/20 0358 05/02/20 0651  NA 136 133* 132* 131*  K 3.0* 3.7 2.9* 3.0*  CL 103 101 101 106  CO2 19* 20* 20* 18*   GLUCOSE 206* 160* 137* 146*  BUN 11 21 16  6*  CREATININE 1.38* 1.45* 1.06* 0.94  CALCIUM 9.7 9.0 8.1* 8.0*  MG  --   --   --  1.1*   GFR: Estimated Creatinine Clearance: 42.6 mL/min (by C-G formula based on SCr of 0.94 mg/dL). Liver Function Tests: Recent Labs  Lab 04/26/20 0804 04/30/20 1440  AST 15 19  ALT 8 13  ALKPHOS 58 50  BILITOT 0.5 0.7  PROT 6.9 6.8  ALBUMIN 3.4* 3.8   No results for input(s): LIPASE, AMYLASE in the last 168 hours. No results for input(s): AMMONIA in the last 168 hours. Coagulation Profile: Recent Labs  Lab 04/30/20 1440  INR 1.0   Cardiac Enzymes: No results for input(s): CKTOTAL, CKMB, CKMBINDEX, TROPONINI in the last 168 hours. BNP (last 3 results) No results for input(s): PROBNP in the last 8760 hours. HbA1C: No results for input(s): HGBA1C in the last 72 hours. CBG: Recent Labs  Lab 05/02/20 1503  GLUCAP 149*   Lipid Profile: No results for input(s): CHOL, HDL, LDLCALC, TRIG, CHOLHDL, LDLDIRECT in the last 72 hours. Thyroid Function Tests: No results for input(s): TSH, T4TOTAL, FREET4, T3FREE, THYROIDAB in the last 72 hours. Anemia Panel: Recent Labs    04/30/20 1440  VITAMINB12 940*  FOLATE 6.4  FERRITIN 1,053*  TIBC 380  IRON 34  RETICCTPCT 2.5   Sepsis Labs: Recent Labs  Lab 04/30/20 1440 04/30/20 1700  LATICACIDVEN 2.3* 2.3*    Recent Results (from the past 240 hour(s))  Culture, blood (routine x 2)     Status: None (Preliminary result)   Collection Time: 04/30/20  3:12 PM   Specimen: Right Antecubital; Blood  Result Value Ref Range Status   Specimen Description RIGHT ANTECUBITAL  Final   Special Requests   Final    BOTTLES DRAWN AEROBIC AND ANAEROBIC Blood Culture adequate volume   Culture   Final    NO GROWTH 2 DAYS Performed at [facility], [address], [place], [place] [address]    Report Status PENDING  Incomplete  Culture, blood (routine x 2)     Status: None (Preliminary result)   Collection  Time: 04/30/20  3:13 PM   Specimen: BLOOD RIGHT HAND  Result Value Ref Range Status   Specimen Description BLOOD RIGHT HAND  Final   Special Requests   Final    BOTTLES DRAWN AEROBIC AND ANAEROBIC Blood Culture adequate volume   Culture   Final    NO GROWTH 2 DAYS Performed at [facility], [address], [place], [place] [address]    Report Status PENDING  Incomplete  SARS Coronavirus 2 by RT PCR (hospital order, performed in [facility] hospital lab) Nasopharyngeal Nasopharyngeal Swab     Status: None   Collection Time: 04/30/20  6:18 PM   Specimen: Nasopharyngeal Swab  Result Value Ref Range Status   SARS Coronavirus 2 NEGATIVE NEGATIVE Final    Comment: (NOTE) SARS-CoV-2 target nucleic acids are NOT DETECTED.  The SARS-CoV-2 RNA is generally detectable in upper and lower respiratory specimens during the acute phase of infection. The lowest concentration of SARS-CoV-2 viral copies this assay can detect is 250 copies / mL. A negative result  does not preclude SARS-CoV-2 infection and should not be used as the sole basis for treatment or other patient management decisions.  A negative result may occur with improper specimen collection / handling, submission of specimen other than nasopharyngeal swab, presence of viral mutation(s) within the areas targeted by this assay, and inadequate number of viral copies (<250 copies / mL). A negative result must be combined with clinical observations, patient history, and epidemiological information.  Fact Sheet for Patients:   StrictlyIdeas.no  Fact Sheet for Healthcare Providers: BankingDealers.co.za  This test is not yet approved or  cleared by the [place] FDA and has been authorized for detection and/or diagnosis of SARS-CoV-2 by FDA under an Emergency Use Authorization (EUA).  This EUA will remain in effect (meaning this test can be used) for the duration of the COVID-19 declaration  under Section 564(b)(1) of the Act, 21 U.S.C. section 360bbb-3(b)(1), unless the authorization is terminated or revoked sooner.  Performed at [facility], [address] [place], [place] [address]          Radiology Studies: No results found.      Scheduled Meds: . allopurinol  300 mg Oral Daily  . amLODipine  10 mg Oral Daily  . cholecalciferol  1,000 Units Oral Daily  . dicyclomine  10 mg Oral TID AC & HS  . levothyroxine  75 mcg Oral Daily  . magic mouthwash w/lidocaine  15 mL Oral TID  . pantoprazole  40 mg Oral BID  . rOPINIRole  0.25 mg Oral QHS  . sucralfate  1 g Oral TID WC & HS  . vitamin B-12  1,000 mcg Oral Daily   Continuous Infusions: . sodium chloride    . ciprofloxacin    . dextrose 5 % and 0.9% NaCl 75 mL/hr at 05/02/20 1506  . metronidazole Stopped (05/02/20 1537)     LOS: 2 days    Time spent: 28mins    Kathie Dike, MD [facility]   If 7PM-7AM, please contact night-coverage [email or web address]  05/02/2020, 9:02 PM

## 2020-05-02 NOTE — Progress Notes (Signed)
Elizabeth Mcintyre, M.D. Gastroenterology & Hepatology   Interval History: No acute events overnight.  However, the patient was noted to have a fever spike yesterday up to 101. Patient only drank 2 cups of her bowel prep yesterday night.  She restarted drinking her prep at 9 in the morning today.  States her stools are lighter and she has not seen more fresh blood in it recently.  Also endorses that her abdominal pain has completely resolved.  Denies any nausea, vomiting, fever, chills, lightheadedness or dizziness. Lab results from today show worsening anemia of 7.9, but also leukopenia 1.6k and thrombocytopenia of 82,000.  She has presented some hypokalemia and hyponatremia without any other alterations in her electrolytes or renal function.  Inpatient Medications:  Current Facility-Administered Medications:  .  0.9 %  sodium chloride infusion, , Intravenous, Continuous, Adefeso, Oladapo, DO .  acetaminophen (TYLENOL) tablet 650 mg, 650 mg, Oral, Q6H PRN, Roney Jaffe, MD, 650 mg at 05/01/20 2107 .  albuterol (PROVENTIL) (2.5 MG/3ML) 0.083% nebulizer solution 2.5 mg, 2.5 mg, Nebulization, Q6H PRN, Roney Jaffe, MD .  allopurinol (ZYLOPRIM) tablet 300 mg, 300 mg, Oral, Daily, Roney Jaffe, MD, 300 mg at 05/01/20 0925 .  amLODipine (NORVASC) tablet 10 mg, 10 mg, Oral, Daily, Roney Jaffe, MD, 10 mg at 05/01/20 0925 .  bisacodyl (DULCOLAX) suppository 10 mg, 10 mg, Rectal, Daily PRN, Roney Jaffe, MD .  cholecalciferol (VITAMIN D3) tablet 1,000 Units, 1,000 Units, Oral, Daily, Roney Jaffe, MD, 1,000 Units at 05/01/20 0925 .  dextrose 5 %-0.9 % sodium chloride infusion, , Intravenous, Continuous, Heath Lark D, DO, Last Rate: 75 mL/hr at 05/02/20 0532, New Bag at 05/02/20 0532 .  dicyclomine (BENTYL) capsule 10 mg, 10 mg, Oral, TID AC & HS, Manuella Ghazi, Pratik D, DO, 10 mg at 05/01/20 2107 .  levothyroxine (SYNTHROID) tablet 75 mcg, 75 mcg, Oral, Daily, Roney Jaffe, MD, 75 mcg at  05/02/20 0526 .  ondansetron (ZOFRAN) tablet 4 mg, 4 mg, Oral, Q6H PRN **OR** ondansetron (ZOFRAN) injection 4 mg, 4 mg, Intravenous, Q6H PRN, Roney Jaffe, MD .  pantoprazole (PROTONIX) EC tablet 40 mg, 40 mg, Oral, BID, Roney Jaffe, MD, 40 mg at 05/01/20 2107 .  polyethylene glycol (MIRALAX / GLYCOLAX) packet 17 g, 17 g, Oral, Daily PRN, Roney Jaffe, MD .  rOPINIRole (REQUIP) tablet 0.25 mg, 0.25 mg, Oral, QHS, Roney Jaffe, MD, 0.25 mg at 05/01/20 2107 .  sucralfate (CARAFATE) 1 GM/10ML suspension 1 g, 1 g, Oral, TID WC & HS, Shah, Pratik D, DO, 1 g at 05/01/20 2107 .  vitamin B-12 (CYANOCOBALAMIN) tablet 1,000 mcg, 1,000 mcg, Oral, Daily, Roney Jaffe, MD, 1,000 mcg at 05/01/20 0925   I/O    Intake/Output Summary (Last 24 hours) at 05/02/2020 0937 Last data filed at 05/02/2020 0600 Gross per 24 hour  Intake 1525.2 ml  Output --  Net 1525.2 ml     Physical Exam: Temp:  [98.6 F (37 C)-101 F (38.3 C)] 99 F (37.2 C) (08/18 0523) Pulse Rate:  [75-104] 103 (08/18 0523) Resp:  [16-20] 20 (08/18 0523) BP: (111-151)/(41-55) 141/55 (08/18 0523) SpO2:  [98 %-99 %] 99 % (08/18 0523) Weight:  [60.7 kg] 60.7 kg (08/18 0500)  Temp (24hrs), Avg:99.5 F (37.5 C), Min:98.6 F (37 C), Max:101 F (38.3 C) GENERAL: The patient is AO x3, in no acute distress. HEENT: Head is normocephalic and atraumatic. EOMI are intact. Mouth is well hydrated and without lesions. NECK: Supple. No masses LUNGS: Clear to auscultation. No presence of  rhonchi/wheezing/rales. Adequate chest expansion HEART: RRR, normal s1 and s2. CHEST: Has porth-a-cath in R side of chest ABDOMEN: non-tender, no guarding, no peritoneal signs, and nondistended. BS +. No masses. EXTREMITIES: Without any cyanosis, clubbing, rash, lesions or edema. NEUROLOGIC: AOx3, no focal motor deficit. SKIN: no jaundice, no rashes  Laboratory Data: CBC:     Component Value Date/Time   WBC 1.6 (L) 05/02/2020 0651   RBC 2.82  (L) 05/02/2020 0651   HGB 7.9 (L) 05/02/2020 0651   HGB 10.3 (L) 04/26/2020 0804   HCT 23.4 (L) 05/02/2020 0651   PLT 82 (L) 05/02/2020 0651   PLT 106 (L) 04/26/2020 0804   MCV 83.0 05/02/2020 0651   MCH 28.0 05/02/2020 0651   MCHC 33.8 05/02/2020 0651   RDW 16.1 (H) 05/02/2020 0651   LYMPHSABS 0.1 (L) 04/30/2020 1440   MONOABS 0.2 04/30/2020 1440   EOSABS 0.1 04/30/2020 1440   BASOSABS 0.0 04/30/2020 1440   COAG:  Lab Results  Component Value Date   INR 1.0 04/30/2020   INR 1.1 03/30/2020   INR 1.0 03/13/2020    BMP:  BMP Latest Ref Rng & Units 05/02/2020 05/01/2020 04/30/2020  Glucose 70 - 99 mg/dL 146(H) 137(H) 160(H)  BUN 8 - 23 mg/dL 6(L) 16 21  Creatinine 0.44 - 1.00 mg/dL 0.94 1.06(H) 1.45(H)  Sodium 135 - 145 mmol/L 131(L) 132(L) 133(L)  Potassium 3.5 - 5.1 mmol/L 3.0(L) 2.9(L) 3.7  Chloride 98 - 111 mmol/L 106 101 101  CO2 22 - 32 mmol/L 18(L) 20(L) 20(L)  Calcium 8.9 - 10.3 mg/dL 8.0(L) 8.1(L) 9.0    HEPATIC:  Hepatic Function Latest Ref Rng & Units 04/30/2020 04/26/2020 04/19/2020  Total Protein 6.5 - 8.1 g/dL 6.8 6.9 6.9  Albumin 3.5 - 5.0 g/dL 3.8 3.4(L) 3.4(L)  AST 15 - 41 U/L 19 15 12(L)  ALT 0 - 44 U/L $Remo'13 8 7  'PDLKr$ Alk Phosphatase 38 - 126 U/L 50 58 62  Total Bilirubin 0.3 - 1.2 mg/dL 0.7 0.5 0.4  Bilirubin, Direct <=0.2 mg/dL - - -    CARDIAC:  Lab Results  Component Value Date   CKTOTAL 158 11/29/2009   CKMB 2.1 11/29/2009   TROPONINI <0.03 08/05/2018      Imaging: I personally reviewed and interpreted the available labs, imaging and endoscopic files.   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  The patient has had abdominal pain along with episodes of fresh blood in her stool which have much improved after she took her bowel prep yesterday.   Notably, she presented that episode of fever.  She has had a drop in all her cell lines which could be related to her chemotherapy vs malignancy as she has not presented any significant gastrointestinal bleeding since yesterday night. Differential diagnosis given her presentation includes possible diverticulitis, versus ischemic colitis (vascular supply is not typical for this) versus less likely diverticular bleeding.  It is less likely that chemotherapy led to the episode of focal colitis.  She will need to be started on antibiotics today  for anaerobic and gram-negative bacteria.  We will proceed with colonoscopy today to evaluate the area thoroughly.  Patient should be monitored for any further drop in her hemoglobin with transfusions as needed.  - Start ciprofloxacin and Flagyl - to receive a 10 day course - Repeat  CBC qday, transfuse if Hb <7 - 2 large bore IV lines - Active T/S - Avoid NSAIDs/AC - Will proceed with colonoscopy today, please keep NPO  Harvel Quale, MD Gastroenterology and Hepatology [facility]  Note: Occasional unusual wording and randomly placed punctuation marks may result from the use of speech recognition technology to transcribe this document

## 2020-05-02 NOTE — Transfer of Care (Signed)
Immediate Anesthesia Transfer of Care Note  Patient: Elizabeth Mcintyre  Procedure(s) Performed: COLONOSCOPY WITH PROPOFOL (N/A ) POLYPECTOMY BIOPSY  Patient Location: Endoscopy Unit  Anesthesia Type:General  Level of Consciousness: awake, alert , oriented and patient cooperative  Airway & Oxygen Therapy: Patient Spontanous Breathing and Patient connected to nasal cannula oxygen  Post-op Assessment: Report given to RN, Post -op Vital signs reviewed and stable and Patient moving all extremities  Post vital signs: Reviewed and stable  Last Vitals:  Vitals Value Taken Time  BP 121/51 05/02/20 1451  Temp    Pulse 82 05/02/20 1452  Resp 21 05/02/20 1452  SpO2 98 % 05/02/20 1452  Vitals shown include unvalidated device data.  Last Pain:  Vitals:   05/02/20 1415  TempSrc:   PainSc: 0-No pain      Patients Stated Pain Goal: 5 (39/53/20 2334)  Complications: No complications documented.

## 2020-05-03 ENCOUNTER — Inpatient Hospital Stay (HOSPITAL_COMMUNITY): Payer: Medicare Other

## 2020-05-03 DIAGNOSIS — K5730 Diverticulosis of large intestine without perforation or abscess without bleeding: Secondary | ICD-10-CM

## 2020-05-03 DIAGNOSIS — K922 Gastrointestinal hemorrhage, unspecified: Secondary | ICD-10-CM

## 2020-05-03 DIAGNOSIS — K648 Other hemorrhoids: Secondary | ICD-10-CM

## 2020-05-03 DIAGNOSIS — K55031 Focal (segmental) acute (reversible) ischemia of large intestine: Secondary | ICD-10-CM

## 2020-05-03 LAB — MAGNESIUM: Magnesium: 1.9 mg/dL (ref 1.7–2.4)

## 2020-05-03 LAB — CBC
HCT: 21.7 % — ABNORMAL LOW (ref 36.0–46.0)
Hemoglobin: 7.4 g/dL — ABNORMAL LOW (ref 12.0–15.0)
MCH: 28.4 pg (ref 26.0–34.0)
MCHC: 34.1 g/dL (ref 30.0–36.0)
MCV: 83.1 fL (ref 80.0–100.0)
Platelets: 90 10*3/uL — ABNORMAL LOW (ref 150–400)
RBC: 2.61 MIL/uL — ABNORMAL LOW (ref 3.87–5.11)
RDW: 16.7 % — ABNORMAL HIGH (ref 11.5–15.5)
WBC: 1 10*3/uL — CL (ref 4.0–10.5)
nRBC: 0 % (ref 0.0–0.2)

## 2020-05-03 LAB — RENAL FUNCTION PANEL
Albumin: 2.7 g/dL — ABNORMAL LOW (ref 3.5–5.0)
Anion gap: 9 (ref 5–15)
BUN: <5 mg/dL — ABNORMAL LOW (ref 8–23)
CO2: 20 mmol/L — ABNORMAL LOW (ref 22–32)
Calcium: 7.8 mg/dL — ABNORMAL LOW (ref 8.9–10.3)
Chloride: 107 mmol/L (ref 98–111)
Creatinine, Ser: 0.84 mg/dL (ref 0.44–1.00)
GFR calc Af Amer: >60 mL/min (ref >60)
GFR calc non Af Amer: >60 mL/min (ref >60)
Glucose, Bld: 115 mg/dL — ABNORMAL HIGH (ref 70–99)
Phosphorus: 2 mg/dL — ABNORMAL LOW (ref 2.5–4.6)
Potassium: 3 mmol/L — ABNORMAL LOW (ref 3.5–5.1)
Sodium: 136 mmol/L (ref 135–145)

## 2020-05-03 LAB — PREPARE RBC (CROSSMATCH)

## 2020-05-03 IMAGING — CT CT CTA ABD/PEL W/CM AND/OR W/O CM
3 of 9 series · 11 of 46 positions shown, 17 images · IV contrast (omnipaque)
Comparison: Noncontrast CT from earlier the same day, and earlier
studies

CLINICAL DATA: Mesenteric ischemia, rectal bleeding, abdominal pain

EXAM:
CTA ABDOMEN AND PELVIS WITH CONTRAST
TECHNIQUE: Multidetector CT imaging of the abdomen and pelvis was performed
using the standard protocol during bolus administration of
intravenous contrast. Multiplanar reconstructed images and MIPs were
obtained and reviewed to evaluate the vascular anatomy.
CONTRAST:  100mL OMNIPAQUE IOHEXOL 350 MG/ML SOLN well-tolerated

[Series 4: arterial · axial · arterial · 0.72mm/px · z∈[+923,+1035]mm · 3 of 237 slices shown]
[im 28/237  soft-tissue]
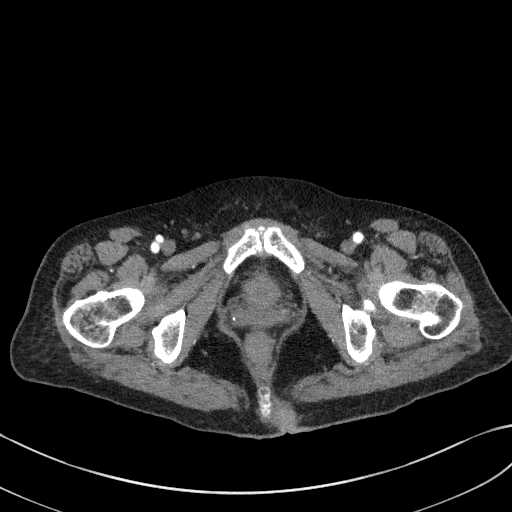
[im 56/237  soft-tissue]
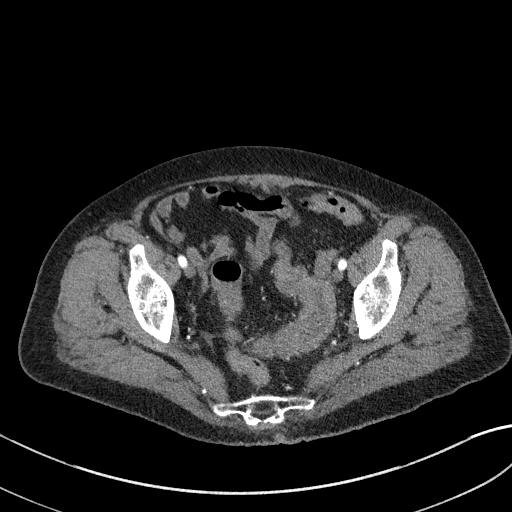
[im 84/237  soft-tissue]
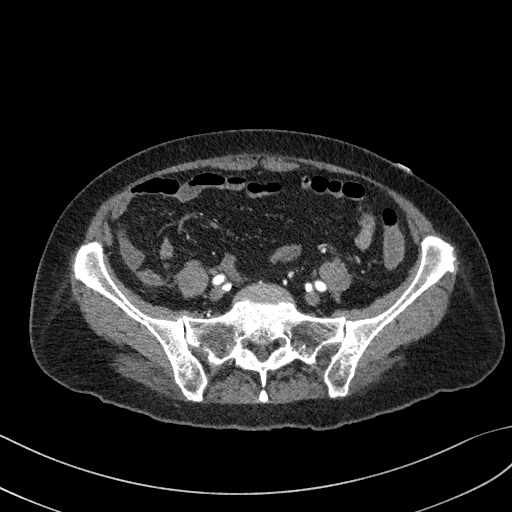

[Series 7: cor art · coronal · 0.82mm/px · 2 of 146 slices shown, 3 images]
[im 49/146  soft-tissue]
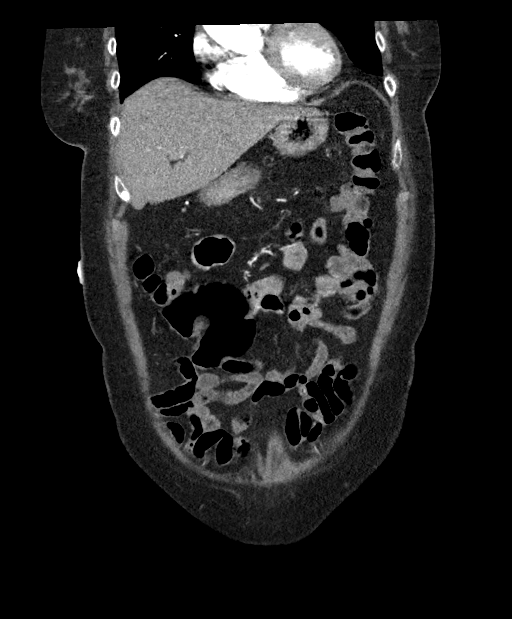
[im 49/146  bone]
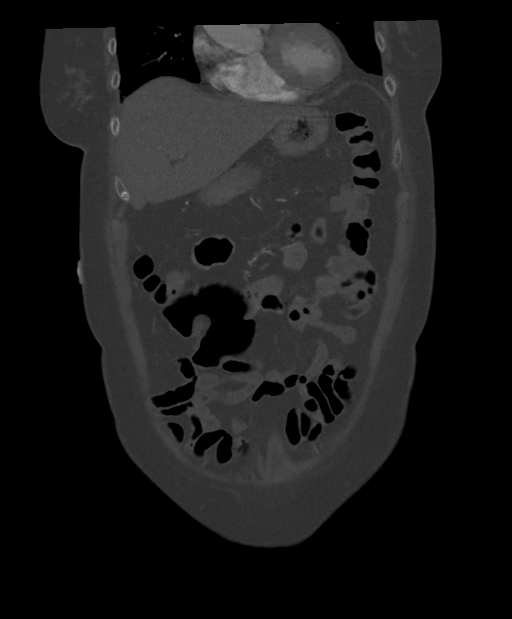
[im 97/146  soft-tissue]
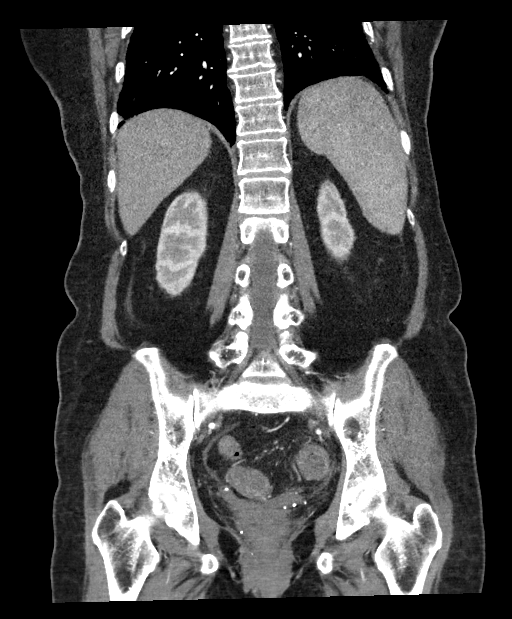

[Series 11: portal venous · axial · portal-venous · 0.72mm/px · z∈[+936,+1271]mm · 6 of 95 slices shown, 11 images]
[im 14/95  soft-tissue]
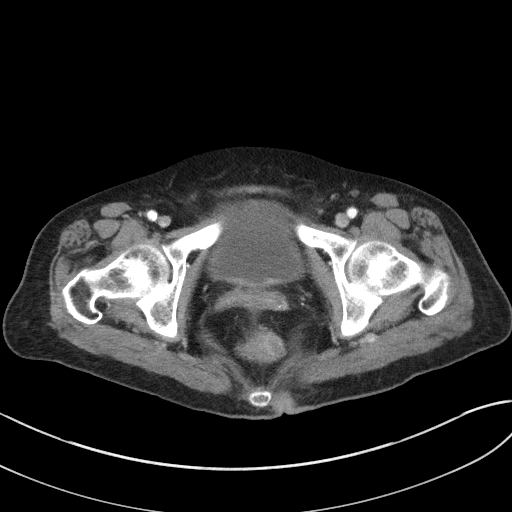
[im 14/95  bone]
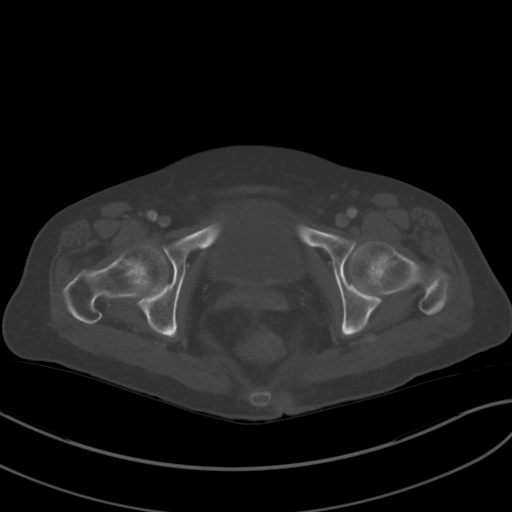
[im 27/95  soft-tissue]
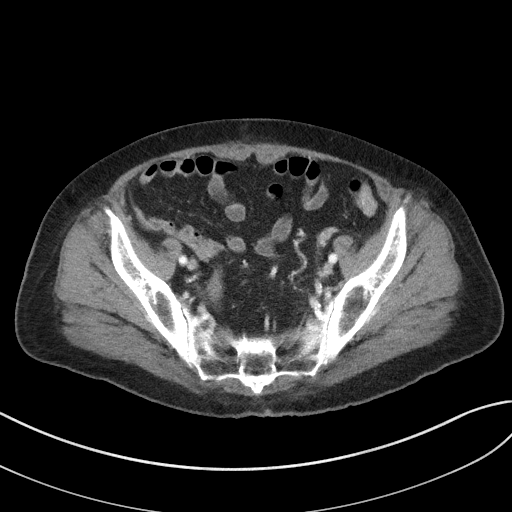
[im 41/95  soft-tissue]
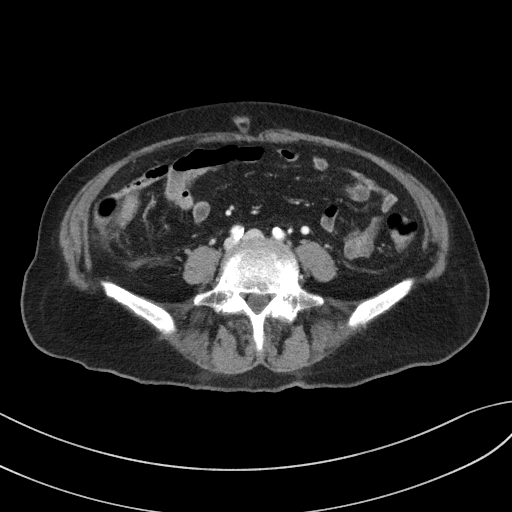
[im 41/95  lung]
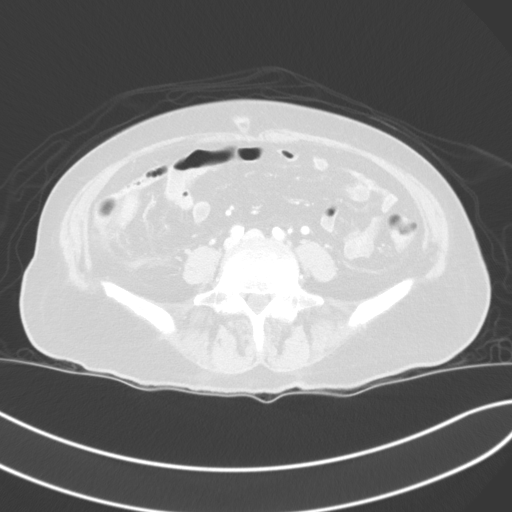
[im 54/95  soft-tissue]
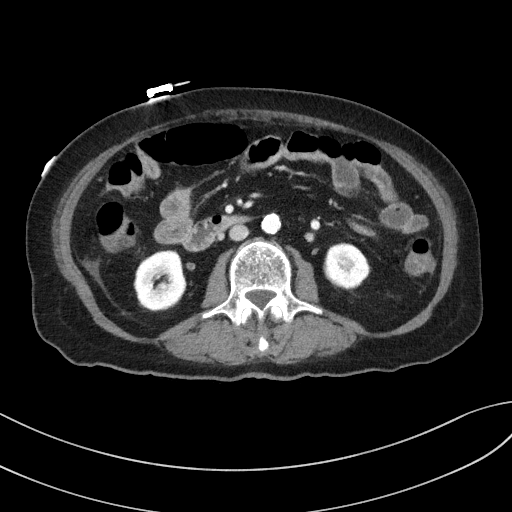
[im 54/95  lung]
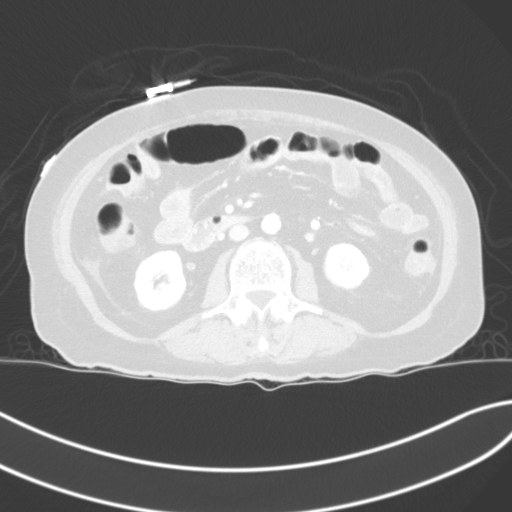
[im 68/95  soft-tissue]
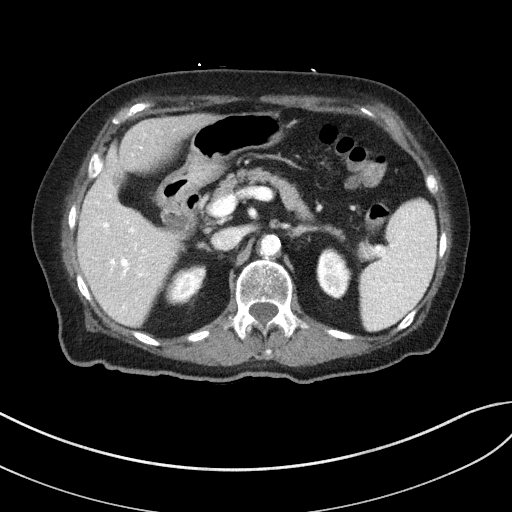
[im 68/95  lung]
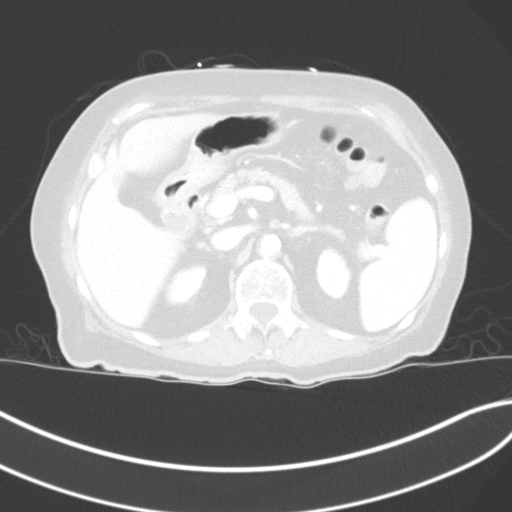
[im 81/95  soft-tissue]
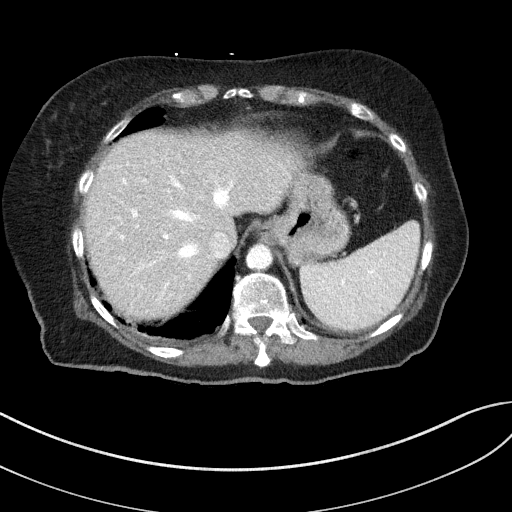
[im 81/95  lung]
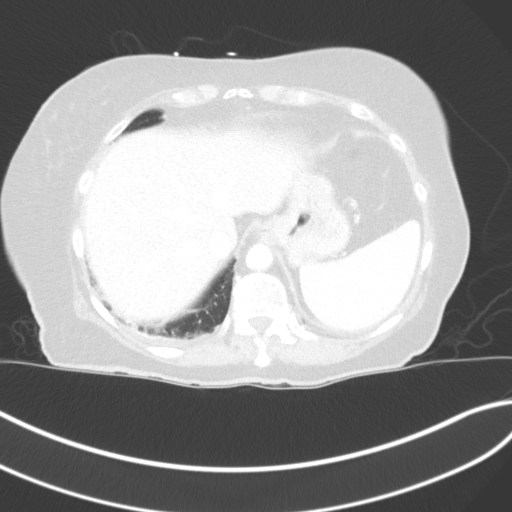

[11 of 46 positions shown; findings below may reference images not displayed]

FINDINGS: VASCULAR

Aorta: Extensive calcified atheromatous plaque particularly in the
infrarenal segment. No aneurysm, dissection, or stenosis.

Celiac: Calcified ostial plaque resulting in only mild stenosis,
patent distally

SMA: Calcified ostial plaque resulting in only mild stenosis,
atheromatous but patent distally.

Renals: Duplicated left, superior dominant with heavily calcified
ostial plaque resulting in short segment stenosis of at least mild
severity. Single right, with heavily calcified ostial plaque root
extending over length of approximately 1.6 cm resulting in at least
moderate stenosis, patent distally.

IMA: Patent

Inflow: Moderate calcified plaque involving bilateral common and
internal iliac arteries, without high-grade stenosis.

Proximal Outflow: Atheromatous, patent.

Veins: Patent hepatic veins, portal vein, SMV, splenic vein,
bilateral renal veins. Iliac venous system and IVC unremarkable. No
venous pathology identified.

Review of the MIP images confirms the above findings.

NON-VASCULAR

Lower chest: Tip of central venous catheter noted at the cavoatrial
junction. Coronary calcifications. No pleural or pericardial
effusion. Linear subpleural scarring or atelectasis posteriorly in
the lung bases.

Hepatobiliary: No focal liver abnormality is seen. Status post
cholecystectomy. No biliary dilatation.

Pancreas: Unremarkable. No pancreatic ductal dilatation or
surrounding inflammatory changes.

Spleen: Normal in size without focal abnormality.

Adrenals/Urinary Tract: Adrenal glands unremarkable. 1.5 cm
exophytic cyst from the mid right kidney since [DATE]. No
hydronephrosis. Urinary bladder physiologically distended.

Stomach/Bowel: Stomach is nondistended. Small bowel decompressed.
Appendix not discretely identified. The colon is nondilated. There
is circumferential wall thickening and mild enhancement in the mid
sigmoid colon.

Lymphatic: No mesenteric, abdominal or pelvic adenopathy.

Reproductive: Status post hysterectomy. No adnexal masses.

Other: Slight increase in small volume pelvic ascites.  No free air.

Musculoskeletal: No acute or significant osseous findings.
IMPRESSION: 1. No significant proximal mesenteric arterial occlusive disease to
suggest etiology of mesenteric ischemia.
2. Circumferential wall thickening and mild enhancement in the mid
sigmoid colon suggesting colitis. Consider colonoscopic follow-up to
exclude mucosal lesion.
3. Bilateral renal artery ostial stenoses.
4. Coronary artery disease.

Aortic Atherosclerosis ([FS]-[FS]).

## 2020-05-03 MED ORDER — SODIUM CHLORIDE 0.9% IV SOLUTION: Freq: Once | INTRAVENOUS | Status: AC

## 2020-05-03 MED ORDER — POTASSIUM CHLORIDE CRYS ER 20 MEQ PO TBCR
40.0000 meq | EXTENDED_RELEASE_TABLET | ORAL | Status: AC
  Administered 2020-05-03 (×2): 40 meq via ORAL
  Filled 2020-05-03 (×2): qty 2

## 2020-05-03 MED ORDER — POTASSIUM PHOSPHATES 15 MMOLE/5ML IV SOLN
15.0000 mmol | Freq: Once | INTRAVENOUS | Status: AC
  Administered 2020-05-03: 15 mmol via INTRAVENOUS
  Filled 2020-05-03: qty 5

## 2020-05-03 MED ORDER — IOHEXOL 350 MG/ML SOLN
100.0000 mL | Freq: Once | INTRAVENOUS | Status: AC | PRN
  Administered 2020-05-03: 100 mL via INTRAVENOUS

## 2020-05-03 NOTE — Progress Notes (Signed)
Subjective:  Patient says Elizabeth Mcintyre feels better.  Pain is now mild.  Elizabeth Mcintyre is passing liquid stool but no blood.  Elizabeth Mcintyre does not have a good appetite.  Elizabeth Mcintyre said Elizabeth Mcintyre has lost about 11 pounds even before Elizabeth Mcintyre was begun on chemotherapy for lymphoma.  Current Medications:  Current Facility-Administered Medications:  .  0.9 %  sodium chloride infusion, , Intravenous, Continuous, Adefeso, Olad[place], DO .  acetaminophen (TYLENOL) tablet 650 mg, 650 mg, Oral, Q6H PRN, Roney Jaffe, MD, 650 mg at 05/02/20 2153 .  albuterol (PROVENTIL) (2.5 MG/3ML) 0.083% nebulizer solution 2.5 mg, 2.5 mg, Nebulization, Q6H PRN, Roney Jaffe, MD .  allopurinol (ZYLOPRIM) tablet 300 mg, 300 mg, Oral, Daily, Roney Jaffe, MD, 300 mg at 05/03/20 0921 .  amLODipine (NORVASC) tablet 10 mg, 10 mg, Oral, Daily, Roney Jaffe, MD, 10 mg at 05/03/20 0921 .  bisacodyl (DULCOLAX) suppository 10 mg, 10 mg, Rectal, Daily PRN, Roney Jaffe, MD .  cholecalciferol (VITAMIN D3) tablet 1,000 Units, 1,000 Units, Oral, Daily, Roney Jaffe, MD, 1,000 Units at 05/03/20 [phone] .  ciprofloxacin (CIPRO) IVPB 400 mg, 400 mg, Intravenous, Q12H, Harvel Quale, MD, Last Rate: 200 mL/hr at 05/03/20 0215, 400 mg at 05/03/20 0215 .  dextrose 5 %-0.9 % sodium chloride infusion, , Intravenous, Continuous, Manuella Ghazi, Pratik D, DO, Last Rate: 75 mL/hr at 05/02/20 1506, Restarted at 05/02/20 1506 .  dicyclomine (BENTYL) capsule 10 mg, 10 mg, Oral, TID AC & HS, Shah, Pratik D, DO, 10 mg at 05/03/20 0921 .  levothyroxine (SYNTHROID) tablet 75 mcg, 75 mcg, Oral, Daily, Roney Jaffe, MD, 75 mcg at 05/03/20 0528 .  magic mouthwash w/lidocaine, 15 mL, Oral, TID, Kathie Dike, MD, 15 mL at 05/03/20 0920 .  metroNIDAZOLE (FLAGYL) IVPB 500 mg, 500 mg, Intravenous, Q8H, Harvel Quale, MD, Last Rate: 100 mL/hr at 05/03/20 0524, 500 mg at 05/03/20 0524 .  ondansetron (ZOFRAN) tablet 4 mg, 4 mg, Oral, Q6H PRN **OR** ondansetron (ZOFRAN)  injection 4 mg, 4 mg, Intravenous, Q6H PRN, Roney Jaffe, MD .  pantoprazole (PROTONIX) EC tablet 40 mg, 40 mg, Oral, BID, Roney Jaffe, MD, 40 mg at 05/03/20 0921 .  polyethylene glycol (MIRALAX / GLYCOLAX) packet 17 g, 17 g, Oral, Daily PRN, Roney Jaffe, MD .  rOPINIRole (REQUIP) tablet 0.25 mg, 0.25 mg, Oral, QHS, Roney Jaffe, MD, 0.25 mg at 05/02/20 2153 .  sucralfate (CARAFATE) 1 GM/10ML suspension 1 g, 1 g, Oral, TID WC & HS, Shah, Pratik D, DO, 1 g at 05/03/20 0921 .  vitamin B-12 (CYANOCOBALAMIN) tablet 1,000 mcg, 1,000 mcg, Oral, Daily, Roney Jaffe, MD, 1,000 mcg at 05/03/20 9735  Objective: Blood pressure (!) 108/50, pulse 73, temperature 98.3 F (36.8 C), temperature source Oral, resp. rate 16, height _0  (1.626 m), weight 59.9 kg, SpO2 100 %. Patient is alert and in no acute distress. Abdomen is soft.  Elizabeth Mcintyre has mild tenderness in periumbilical area and right lower quadrant.  There is some rebound but no guarding.  No organomegaly or masses.  Labs/studies Results:  CBC Latest Ref Rng & Units 05/03/2020 05/02/2020 05/01/2020  WBC 4.0 - 10.5 K/uL 1.0(LL) 1.6(L) -  Hemoglobin 12.0 - 15.0 g/dL 7.4(L) 7.9(L) 8.5(L)  Hematocrit 36 - 46 % 21.7(L) 23.4(L) 25.6(L)  Platelets 150 - 400 K/uL 90(L) 82(L) -    CMP Latest Ref Rng & Units 05/03/2020 05/02/2020 05/01/2020  Glucose 70 - 99 mg/dL 115(H) 146(H) 137(H)  BUN 8 - 23 mg/dL <5(L) 6(L) 16  Creatinine 0.44 - 1.00  mg/dL 0.84 0.94 1.06(H)  Sodium 135 - 145 mmol/L 136 131(L) 132(L)  Potassium 3.5 - 5.1 mmol/L 3.0(L) 3.0(L) 2.9(L)  Chloride 98 - 111 mmol/L 107 106 101  CO2 22 - 32 mmol/L 20(L) 18(L) 20(L)  Calcium 8.9 - 10.3 mg/dL 7.8(L) 8.0(L) 8.1(L)  Total Protein 6.5 - 8.1 g/dL - - -  Total Bilirubin 0.3 - 1.2 mg/dL - - -  Alkaline Phos 38 - 126 U/L - - -  AST 15 - 41 U/L - - -  ALT 0 - 44 U/L - - -    Hepatic Function Latest Ref Rng & Units 05/03/2020 04/30/2020 04/26/2020  Total Protein 6.5 - 8.1 g/dL - 6.8 6.9   Albumin 3.5 - 5.0 g/dL 2.7(L) 3.8 3.4(L)  AST 15 - 41 U/L - 19 15  ALT 0 - 44 U/L - 13 8  Alk Phosphatase 38 - 126 U/L - 50 58  Total Bilirubin 0.3 - 1.2 mg/dL - 0.7 0.5  Bilirubin, Direct <=0.2 mg/dL - - -   Colonic biopsies are pending  Assessment:  #1.  Rectal bleeding secondary to acute colitis involving rectosigmoid area.  Endoscopic appearance very suspicious for ischemic colitis.  Doubt chemotherapy induced or viral colitis.  Biopsies are pending.  Patient on IV Cipro and metronidazole. Hemoglobin has dropped from 7.9 to 7.4 g yesterday.  #2.  Pancytopenia secondary to chemotherapy.  Recommendations  GI pathogen panel. Respiratory/enteric precautions while patient neutropenic.

## 2020-05-03 NOTE — Progress Notes (Signed)
CRITICAL VALUE STICKER  CRITICAL VALUE: WBC 1.0  MD NOTIFIED: 05/03/20  TIME OF NOTIFICATION: 9179

## 2020-05-03 NOTE — Progress Notes (Signed)
Patient requires frequent re-positioning of the body in ways that cannot be achieved with  an ordinary bed or wedge pillow, to eliminate pain, reduce pressure, and the head of the  bed to be elevated more than 30 degrees most of the time due to Active chemotherapy for marginal zone lymphoma.

## 2020-05-03 NOTE — Progress Notes (Signed)
Patient's condition discussed with her daughter Elizabeth Mcintyre who is at bedside. Colon biopsy will not be back until tomorrow. Patient states she has not had rectal bleeding or diarrhea since seen this morning. Will advance diet to diabetic diet starting tomorrow at breakfast.

## 2020-05-03 NOTE — TOC Initial Note (Addendum)
Transition of Care [facility]) - Initial/Assessment Note    Patient Details  Name: Elizabeth Mcintyre MRN: [id number] Date of Birth: October 16, 1940  [facility] CM/SW Contact:    Boneta Lucks, RN Phone Number: 05/03/2020, 1:02 PM  Clinical Narrative:        Patient admitted with GI bleed. Patient has high risk for readmission. Patient is activity getting chemo for lymphoma. Patient lives at home with 59yr old female friend. Daughter - [name] the bedside, stating she will be taking her mom home with her at [address] in [place]. She will not be able to go home and care for her friend.  Daughter is requesting a hospital bed. Barbaraann Rondo accepted the referral. There is also a need for [facility] RN. Patient is not taking her medication, and forgetful.  Daughter states she will have assistance from her siblings to ensure she has 24/7 care at home. Linda with [facility] accepted the referral for .            AddendumBarbaraann Rondo call saying patient told daughter she was not ready for a hospital bed at this time. They will cancel order for now.    Expected Discharge Plan: Home/Self Care Barriers to Discharge: Continued Medical Work up   Patient Goals and CMS Choice Patient states their goals for this hospitalization and ongoing recovery are:: To go home. CMS Medicare.gov Compare Post Acute Care list provided to:: Patient Represenative (must comment) Choice offered to / list presented to : Adult Children  Expected Discharge Plan and Services Expected Discharge Plan: Home/Self Care   Discharge Planning Services: CM Consult Post Acute Care Choice: Home Health, Durable Medical Equipment Living arrangements for the past 2 months: [place]                 DME Arranged: Hospital bed DME Agency: [employer] Date DME Agency Contacted: 05/03/20 Time DME Agency Contacted: [phone] Representative spoke with at DME Agency: [place]: RN   Date [place]: 05/03/20 Time [place]: [id number] Representative spoke with at [place]: [place] Arrangements/Services Living arrangements for the past 2 months: [place] Lives with:: Self   Do you feel safe going back to the place where you live?: Yes      Need for Family Participation in Patient Care: Yes (Comment) Care giver support system in place?: Yes (comment)   Criminal Activity/Legal Involvement Pertinent to Current Situation/Hospitalization: No - Comment as needed  Activities of Daily Living Home Assistive Devices/Equipment: None ADL Screening (condition at time of admission) Patient's cognitive ability adequate to safely complete daily activities?: Yes Is the patient deaf or have difficulty hearing?: No Does the patient have difficulty seeing, even when wearing glasses/contacts?: No Does the patient have difficulty concentrating, remembering, or making decisions?: No Patient able to express need for assistance with ADLs?: Yes Does the patient have difficulty dressing or bathing?: No Independently performs ADLs?: Yes (appropriate for developmental age) Does the patient have difficulty walking or climbing stairs?: No Weakness of Legs: None Weakness of Arms/Hands: None  Permission Sought/Granted Permission sought to share information with : Case Manager    Share Information with NAME: [name]     Permission granted to share info w Relationship: daughter     Emotional Assessment     Affect (typically observed): Accepting Orientation: : Oriented to Self, Oriented to Place, Oriented to  Time, Oriented to Situation Alcohol / Substance Use: Not Applicable Psych Involvement:  No (comment)  Admission diagnosis:  Rectal bleeding [K62.5] GI bleed [K92.2] Patient Active Problem List   Diagnosis Date Noted  . Lower GI bleeding 04/30/2020  . GI bleed 04/30/2020  . Anemia, mild 04/30/2020  . Active chemotherapy 04/30/2020  . Dysphagia 04/18/2020  . Hematemesis  03/30/2020  . Port-A-Cath in place 03/28/2020  . Goals of care, counseling/discussion 03/01/2020  . Marginal zone lymphoma ([place]) 03/01/2020  . Cognitive deficits 01/23/2020  . Lymphadenopathy 01/23/2020  . Increased frequency of headaches 01/23/2020  . 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 Confusion 01/04/2020  . Hepatic steatosis 01/04/2020  . Splenomegaly 01/04/2020  . Thrombocytopenia ([place]) 01/04/2020  . Fecal incontinence 07/07/2019  . Secondary hyperparathyroidism ([place]) 06/09/2019  . Diarrhea 09/10/2018  . 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 Combined hyperlipidemia associated with type 2 diabetes mellitus ([place]) 01/29/2017  . Carotid artery stenosis 06/13/2016  . 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 Vitamin D deficiency 05/25/2015  . Schatzki's ring   . Type 2 diabetes mellitus with complication ([place]) 09/64/3838  . Gastroesophageal reflux disease with esophagitis 03/06/2015  . Gout 06/09/2014  . Acquired hypothyroidism 05/30/2014  . Osteopenia 12/02/2013  . Major depression, recurrent, chronic ([place]) 04/03/2011  . Mild intermittent asthma 11/09/2007  . Essential hypertension, benign 11/09/2007   PCP:  Ma Hillock, DO Pharmacy:   [id number] [place], [place] [address] [place] [address] Phone: [phone] Fax: [phone]  Readmission Risk Interventions Readmission Risk Prevention Plan 05/03/2020  Transportation Screening Complete  HRI or Home Care Consult Complete  Social Work Consult for [place] Planning/Counseling Complete  Palliative Care Screening Not Applicable  Medication Review Press photographer) Complete  Some recent data might be hidden

## 2020-05-03 NOTE — Progress Notes (Signed)
PROGRESS NOTE    Elizabeth Mcintyre  TDH:[id number] DOB: [birth year] DOA: 04/30/2020 PCP: Ma Hillock, DO    Brief Narrative:  Per HPI: The patient is a15 y.o.year-old w/ hx of CVA, HTN, HL, GI bleed, hx RA, DM2, anxiety/ depression, hx lymphoma 2021 currently on chemoRx. Pt presented today for rectal bleeding that started last night. She has had about 4 episodes of pain in the lower abdomen then passing 2 - 3 tbsp's of bright red blood per rectum. Not mixed in with feces. No voiding issues. No diarrhea or fevers. +lightheaded and fatigued than usual. Had 1st rd of chemo 1 mo ago and required admit afterwards for hematemesis, EGD showed esophagitis. Last chemo infusion was 04/26/20. ED spoke w/ GI on call , they recommend admission and they will see in am, tentatively planning for colonoscopy.   Assessment & Plan:   Principal Problem:   Lower GI bleeding Active Problems:   Essential hypertension, benign   Acquired hypothyroidism   Gastroesophageal reflux disease with esophagitis   CKD stage 3 secondary to diabetes (HCC)   Marginal zone lymphoma (HCC)   GI bleed   Anemia, mild   Active chemotherapy   Mild acute blood loss anemia suspected secondary to lower GI bleed -Mid sigmoid thickening per CT scan of unknown significance -Hemoglobin downward trending with plans to transfuse with hemoglobin less than 7 -Avoid anticoagulation -Appreciate GI evaluation -PPI twice daily  -Advance diet as tolerated -Plan for IV iron infusion this week at [facility]; may consider infusion while inpatient if no sign of infection noted -Patient had colonoscopy which showed signs concerning for ischemic colitis. -She underwent CT angiogram of the abdomen that did not show any significant proximal mesenteric arterial occlusive disease  Hypokalemia -Replete, magnesium was also low we replaced  Mild hyponatremia -Change IV fluid to D5 NS -Improved  Prior history of  esophagitis/hematemesis -Recent EGD with esophagitis -Continue PPI twice daily and Carafate  CKD stage IIIa -Creatinine appears to be 1.3-1.6 previously, but is currently 0.84  Hypertension-stable -Continue Norvasc and monitor  Hypothyroidism -Continue Synthroid  NHL stage IIIa -Started on chemo with bendamustine/Rituxan 5 weeks ago -Second round was on 8/12  Pancytopenia -Likely related to recent chemotherapy -Since hemoglobin has trended down, will transfuse 1 unit of PRBC  Neutropenic fever -Patient developed fever overnight, white blood cell count down to 1.0.  Suspect that she also has neutropenia. -Blood cultures have been sent. -She is already on antibiotics.   DVT prophylaxis: SCDs Start: 04/30/20 2145  Code Status: Full code Family Communication: Discussed with daughter at the bedside Disposition Plan: Status is: Inpatient  Remains inpatient appropriate because:Ongoing diagnostic testing needed not appropriate for outpatient work up   Dispo: The patient is from: Home              Anticipated d/c is to: Home              Anticipated d/c date is: 1 day              Patient currently is not medically stable to d/c.    Consultants:   Gastroenterology  Procedures:  Colonoscopy:- One 8 mm polyp in the ascending colon, removed                            with a cold snare. Resected and retrieved.                           -  One 2 mm polyp in the ascending colon, removed                            with a cold biopsy forceps. Resected and retrieved.                           - Diverticulosis in the sigmoid colon, in the                            descending colon and in the transverse colon.                           - Mucosal ulceration in the colon and inflammation.                            Biopsied.                            - Non-bleeding internal hemorrhoids  Antimicrobials:   Ciprofloxacin 8/18 >  Flagyl 8/18 >   Subjective: No nausea or  vomiting.  Still has abdominal soreness.  No blood in stools.  Objective: Vitals:   05/03/20 0500 05/03/20 0527 05/03/20 1738 05/03/20 1805  BP:  (!) 108/50 (!) 133/54 (!) 117/59  Pulse:  73 78 81  Resp:   18 20  Temp:  98.3 F (36.8 C) 98.1 F (36.7 C) 98.4 F (36.9 C)  TempSrc:  Oral Oral Oral  SpO2:  100% 100% 100%  Weight: 59.9 kg     Height:        Intake/Output Summary (Last 24 hours) at 05/03/2020 2034 Last data filed at 05/03/2020 0300 Gross per 24 hour  Intake 1113.8 ml  Output --  Net 1113.8 ml   Filed Weights   05/01/20 0400 05/02/20 0500 05/03/20 0500  Weight: 59.6 kg 60.7 kg 59.9 kg    Examination:  General exam: Alert, awake, oriented x 3 Respiratory system: Clear to auscultation. Respiratory effort normal. Cardiovascular system:RRR. No murmurs, rubs, gallops. Gastrointestinal system: Abdomen is nondistended, soft and nontender. No organomegaly or masses felt. Normal bowel sounds heard. Central nervous system: Alert and oriented. No focal neurological deficits. Extremities: No C/C/E, +pedal pulses Skin: No rashes, lesions or ulcers Psychiatry: Judgement and insight appear normal. Mood & affect appropriate.    Data Reviewed: I have personally reviewed following labs and imaging studies  CBC: Recent Labs  Lab 04/30/20 1440 04/30/20 1440 04/30/20 2212 05/01/20 0358 05/01/20 0934 05/02/20 0651 05/03/20 0705  WBC 4.4  --   --  3.2*  --  1.6* 1.0*  NEUTROABS 4.0  --   --   --   --   --   --   HGB 10.6*   < > 9.0* 8.2* 8.5* 7.9* 7.4*  HCT 31.7*   < > 26.6* 24.5* 25.6* 23.4* 21.7*  MCV 83.2  --   --  82.8  --  83.0 83.1  PLT 115*  --   --  89*  --  82* 90*   < > = values in this interval not displayed.   Basic Metabolic Panel: Recent Labs  Lab 04/30/20 1440 05/01/20 0358 05/02/20 0651 05/03/20 0705  NA 133* 132* 131* 136  K 3.7 2.9* 3.0* 3.0*  CL  101 101 106 107  CO2 20* 20* 18* 20*  GLUCOSE 160* 137* 146* 115*  BUN 21 16 6* <5*   CREATININE 1.45* 1.06* 0.94 0.84  CALCIUM 9.0 8.1* 8.0* 7.8*  MG  --   --  1.1* 1.9  PHOS  --   --   --  2.0*   GFR: Estimated Creatinine Clearance: 47.7 mL/min (by C-G formula based on SCr of 0.84 mg/dL). Liver Function Tests: Recent Labs  Lab 04/30/20 1440 05/03/20 0705  AST 19  --   ALT 13  --   ALKPHOS 50  --   BILITOT 0.7  --   PROT 6.8  --   ALBUMIN 3.8 2.7*   No results for input(s): LIPASE, AMYLASE in the last 168 hours. No results for input(s): AMMONIA in the last 168 hours. Coagulation Profile: Recent Labs  Lab 04/30/20 1440  INR 1.0   Cardiac Enzymes: No results for input(s): CKTOTAL, CKMB, CKMBINDEX, TROPONINI in the last 168 hours. BNP (last 3 results) No results for input(s): PROBNP in the last 8760 hours. HbA1C: No results for input(s): HGBA1C in the last 72 hours. CBG: Recent Labs  Lab 05/02/20 1503  GLUCAP 149*   Lipid Profile: No results for input(s): CHOL, HDL, LDLCALC, TRIG, CHOLHDL, LDLDIRECT in the last 72 hours. Thyroid Function Tests: No results for input(s): TSH, T4TOTAL, FREET4, T3FREE, THYROIDAB in the last 72 hours. Anemia Panel: No results for input(s): VITAMINB12, FOLATE, FERRITIN, TIBC, IRON, RETICCTPCT in the last 72 hours. Sepsis Labs: Recent Labs  Lab 04/30/20 1440 04/30/20 1700  LATICACIDVEN 2.3* 2.3*    Recent Results (from the past 240 hour(s))  Culture, blood (routine x 2)     Status: None (Preliminary result)   Collection Time: 04/30/20  3:12 PM   Specimen: Right Antecubital; Blood  Result Value Ref Range Status   Specimen Description RIGHT ANTECUBITAL  Final   Special Requests   Final    BOTTLES DRAWN AEROBIC AND ANAEROBIC Blood Culture adequate volume   Culture   Final    NO GROWTH 3 DAYS Performed at [facility], [address], [place], [place] [address]    Report Status PENDING  Incomplete  Culture, blood (routine x 2)     Status: None (Preliminary result)   Collection Time: 04/30/20  3:13 PM   Specimen:  BLOOD RIGHT HAND  Result Value Ref Range Status   Specimen Description BLOOD RIGHT HAND  Final   Special Requests   Final    BOTTLES DRAWN AEROBIC AND ANAEROBIC Blood Culture adequate volume   Culture   Final    NO GROWTH 3 DAYS Performed at [facility], [address] [place], [place] [address]    Report Status PENDING  Incomplete  SARS Coronavirus 2 by RT PCR (hospital order, performed in [place] hospital lab) Nasopharyngeal Nasopharyngeal Swab     Status: None   Collection Time: 04/30/20  6:18 PM   Specimen: Nasopharyngeal Swab  Result Value Ref Range Status   SARS Coronavirus 2 NEGATIVE NEGATIVE Final    Comment: (NOTE) SARS-CoV-2 target nucleic acids are NOT DETECTED.  The SARS-CoV-2 RNA is generally detectable in upper and lower respiratory specimens during the acute phase of infection. The lowest concentration of SARS-CoV-2 viral copies this assay can detect is 250 copies / mL. A negative result does not preclude SARS-CoV-2 infection and should not be used as the sole basis for treatment or other patient management decisions.  A negative result may occur with improper  specimen collection / handling, submission of specimen other than nasopharyngeal swab, presence of viral mutation(s) within the areas targeted by this assay, and inadequate number of viral copies (<250 copies / mL). A negative result must be combined with clinical observations, patient history, and epidemiological information.  Fact Sheet for Patients:   StrictlyIdeas.no  Fact Sheet for Healthcare Providers: BankingDealers.co.za  This test is not yet approved or  cleared by the [place] FDA and has been authorized for detection and/or diagnosis of SARS-CoV-2 by FDA under an Emergency Use Authorization (EUA).  This EUA will remain in effect (meaning this test can be used) for the duration of the COVID-19 declaration under Section 564(b)(1) of the Act,  21 U.S.C. section 360bbb-3(b)(1), unless the authorization is terminated or revoked sooner.  Performed at [facility], [address], [place], [place] [address]          Radiology Studies: CT Angio Abd/Pel w/ and/or w/o  Result Date: 05/03/2020 CLINICAL DATA:  Mesenteric ischemia, rectal bleeding, abdominal pain EXAM: CTA ABDOMEN AND PELVIS WITH CONTRAST TECHNIQUE: Multidetector CT imaging of the abdomen and pelvis was performed using the standard protocol during bolus administration of intravenous contrast. Multiplanar reconstructed images and MIPs were obtained and reviewed to evaluate the vascular anatomy. CONTRAST:  182mL OMNIPAQUE IOHEXOL 350 MG/ML SOLN well-tolerated COMPARISON:  Noncontrast CT from earlier the same day, and earlier studies FINDINGS: VASCULAR Aorta: Extensive calcified atheromatous plaque particularly in the infrarenal segment. No aneurysm, dissection, or stenosis. Celiac: Calcified ostial plaque resulting in only mild stenosis, patent distally SMA: Calcified ostial plaque resulting in only mild stenosis, atheromatous but patent distally. Renals: Duplicated left, superior dominant with heavily calcified ostial plaque resulting in short segment stenosis of at least mild severity. Single right, with heavily calcified ostial plaque root extending over length of approximately 1.6 cm resulting in at least moderate stenosis, patent distally. IMA: Patent Inflow: Moderate calcified plaque involving bilateral common and internal iliac arteries, without high-grade stenosis. Proximal Outflow: Atheromatous, patent. Veins: Patent hepatic veins, portal vein, SMV, splenic vein, bilateral renal veins. Iliac venous system and IVC unremarkable. No venous pathology identified. Review of the MIP images confirms the above findings. NON-VASCULAR Lower chest: Tip of central venous catheter noted at the cavoatrial junction. Coronary calcifications. No pleural or pericardial effusion. Linear subpleural  scarring or atelectasis posteriorly in the lung bases. Hepatobiliary: No focal liver abnormality is seen. Status post cholecystectomy. No biliary dilatation. Pancreas: Unremarkable. No pancreatic ductal dilatation or surrounding inflammatory changes. Spleen: Normal in size without focal abnormality. Adrenals/Urinary Tract: Adrenal glands unremarkable. 1.5 cm exophytic cyst from the mid right kidney since 04/10/2011. No hydronephrosis. Urinary bladder physiologically distended. Stomach/Bowel: Stomach is nondistended. Small bowel decompressed. Appendix not discretely identified. The colon is nondilated. There is circumferential wall thickening and mild enhancement in the mid sigmoid colon. Lymphatic: No mesenteric, abdominal or pelvic adenopathy. Reproductive: Status post hysterectomy. No adnexal masses. Other: Slight increase in small volume pelvic ascites.  No free air. Musculoskeletal: No acute or significant osseous findings. IMPRESSION: 1. No significant proximal mesenteric arterial occlusive disease to suggest etiology of mesenteric ischemia. 2. Circumferential wall thickening and mild enhancement in the mid sigmoid colon suggesting colitis. Consider colonoscopic follow-up to exclude mucosal lesion. 3. Bilateral renal artery ostial stenoses. 4. Coronary artery disease. Aortic Atherosclerosis (ICD10-I70.0). Electronically Signed   By: Lucrezia Europe M.D.   On: 05/03/2020 16:43        Scheduled Meds: . allopurinol  300 mg Oral Daily  . amLODipine  10 mg Oral Daily  . cholecalciferol  1,000 Units Oral Daily  . dicyclomine  10 mg Oral TID AC & HS  . levothyroxine  75 mcg Oral Daily  . magic mouthwash w/lidocaine  15 mL Oral TID  . pantoprazole  40 mg Oral BID  . rOPINIRole  0.25 mg Oral QHS  . sucralfate  1 g Oral TID WC & HS  . vitamin B-12  1,000 mcg Oral Daily   Continuous Infusions: . sodium chloride    . ciprofloxacin 400 mg (05/03/20 1243)  . dextrose 5 % and 0.9% NaCl Stopped (05/03/20 1100)   . metronidazole 500 mg (05/03/20 1246)  . potassium PHOSPHATE IVPB (in mmol) 15 mmol (05/03/20 1647)     LOS: 3 days    Time spent: 31mins    Kathie Dike, MD [facility]   If 7PM-7AM, please contact night-coverage [email or web address]  05/03/2020, 8:34 PM

## 2020-05-04 ENCOUNTER — Encounter (HOSPITAL_COMMUNITY): Payer: Self-pay | Admitting: Gastroenterology

## 2020-05-04 LAB — CBC WITH DIFFERENTIAL/PLATELET
Abs Immature Granulocytes: 0.02 10*3/uL (ref 0.00–0.07)
Basophils Absolute: 0 10*3/uL (ref 0.0–0.1)
Basophils Relative: 2 %
Eosinophils Absolute: 0.1 10*3/uL (ref 0.0–0.5)
Eosinophils Relative: 9 %
HCT: 26.4 % — ABNORMAL LOW (ref 36.0–46.0)
Hemoglobin: 9.1 g/dL — ABNORMAL LOW (ref 12.0–15.0)
Immature Granulocytes: 2 %
Lymphocytes Relative: 20 %
Lymphs Abs: 0.2 10*3/uL — ABNORMAL LOW (ref 0.7–4.0)
MCH: 28.4 pg (ref 26.0–34.0)
MCHC: 34.5 g/dL (ref 30.0–36.0)
MCV: 82.5 fL (ref 80.0–100.0)
Monocytes Absolute: 0.2 10*3/uL (ref 0.1–1.0)
Monocytes Relative: 15 %
Neutro Abs: 0.6 10*3/uL — ABNORMAL LOW (ref 1.7–7.7)
Neutrophils Relative %: 52 %
Platelets: 86 10*3/uL — ABNORMAL LOW (ref 150–400)
RBC: 3.2 MIL/uL — ABNORMAL LOW (ref 3.87–5.11)
RDW: 16.7 % — ABNORMAL HIGH (ref 11.5–15.5)
WBC: 1.2 10*3/uL — CL (ref 4.0–10.5)
nRBC: 0 % (ref 0.0–0.2)

## 2020-05-04 LAB — TYPE AND SCREEN
ABO/RH(D): A POS
Antibody Screen: NEGATIVE
Unit division: 0

## 2020-05-04 LAB — GASTROINTESTINAL PANEL BY PCR, STOOL (REPLACES STOOL CULTURE)

## 2020-05-04 LAB — BPAM RBC
Blood Product Expiration Date: 202108222359
ISSUE DATE / TIME: 202108191744
Unit Type and Rh: 6200

## 2020-05-04 LAB — BASIC METABOLIC PANEL
Anion gap: 8 (ref 5–15)
BUN: 5 mg/dL — ABNORMAL LOW (ref 8–23)
CO2: 20 mmol/L — ABNORMAL LOW (ref 22–32)
Calcium: 7.7 mg/dL — ABNORMAL LOW (ref 8.9–10.3)
Chloride: 106 mmol/L (ref 98–111)
Creatinine, Ser: 0.91 mg/dL (ref 0.44–1.00)
GFR calc Af Amer: >60 mL/min (ref >60)
GFR calc non Af Amer: >60 mL/min (ref >60)
Glucose, Bld: 117 mg/dL — ABNORMAL HIGH (ref 70–99)
Potassium: 2.8 mmol/L — ABNORMAL LOW (ref 3.5–5.1)
Sodium: 134 mmol/L — ABNORMAL LOW (ref 135–145)

## 2020-05-04 LAB — SURGICAL PATHOLOGY

## 2020-05-04 LAB — MAGNESIUM: Magnesium: 1.3 mg/dL — ABNORMAL LOW (ref 1.7–2.4)

## 2020-05-04 MED ORDER — POTASSIUM CHLORIDE CRYS ER 20 MEQ PO TBCR
40.0000 meq | EXTENDED_RELEASE_TABLET | ORAL | Status: AC
  Administered 2020-05-04 (×2): 40 meq via ORAL
  Filled 2020-05-04 (×2): qty 2

## 2020-05-04 MED ORDER — POTASSIUM CHLORIDE 10 MEQ/100ML IV SOLN
10.0000 meq | INTRAVENOUS | Status: AC
  Administered 2020-05-04 (×4): 10 meq via INTRAVENOUS
  Filled 2020-05-04 (×4): qty 100

## 2020-05-04 MED ORDER — LOPERAMIDE HCL 2 MG PO CAPS
2.0000 mg | ORAL_CAPSULE | Freq: Four times a day (QID) | ORAL | Status: DC | PRN
  Administered 2020-05-04: 2 mg via ORAL
  Filled 2020-05-04: qty 1

## 2020-05-04 MED ORDER — CHLORHEXIDINE GLUCONATE CLOTH 2 % EX PADS
6.0000 | MEDICATED_PAD | Freq: Every day | CUTANEOUS | Status: DC
  Administered 2020-05-04 – 2020-05-05 (×2): 6 via TOPICAL

## 2020-05-04 MED ORDER — MAGNESIUM SULFATE 4 GM/100ML IV SOLN
4.0000 g | Freq: Once | INTRAVENOUS | Status: AC
  Administered 2020-05-04: 4 g via INTRAVENOUS
  Filled 2020-05-04: qty 100

## 2020-05-04 MED ORDER — FLUCONAZOLE 150 MG PO TABS
150.0000 mg | ORAL_TABLET | Freq: Every day | ORAL | Status: DC
  Administered 2020-05-04 – 2020-05-05 (×2): 150 mg via ORAL
  Filled 2020-05-04 (×2): qty 1

## 2020-05-04 MED ORDER — CIPROFLOXACIN HCL 250 MG PO TABS
500.0000 mg | ORAL_TABLET | Freq: Two times a day (BID) | ORAL | Status: DC
  Administered 2020-05-04 – 2020-05-05 (×3): 500 mg via ORAL
  Filled 2020-05-04 (×3): qty 2

## 2020-05-04 MED ORDER — METRONIDAZOLE 500 MG PO TABS
250.0000 mg | ORAL_TABLET | Freq: Three times a day (TID) | ORAL | Status: DC
  Administered 2020-05-04 – 2020-05-05 (×6): 250 mg via ORAL
  Filled 2020-05-04 (×6): qty 1

## 2020-05-04 NOTE — Progress Notes (Signed)
GI panel neg.  Received order for immodium and c/o always having yeast infection with abx and received order for diflucan.  Has had about 7 small loose stools today.  Daughter has been at bedside all day and walks patient to [facility]

## 2020-05-04 NOTE — Progress Notes (Signed)
Sigmoid colon biopsy shows changes consistent ischemic colitis. Discussed with pathologist.  Deeper sections pending. Lab studies also reviewed with patient.  WBC 1.2.  ANC 520, hemoglobin 9.1 and platelet count 26,203 Metabolic 7 pertinent for serum potassium of 2.8  Will add on serum magnesium to blood from this morning. Discussed with Dr. Roderic Palau to daughter potassium supplement. Patient will also receive IV iron infusion as recommended by her hematologist oncologist.

## 2020-05-04 NOTE — Progress Notes (Signed)
PROGRESS NOTE    Elizabeth Mcintyre  WFU:[id number] DOB: 01-17-1941 DOA: 04/30/2020 PCP: Ma Hillock, DO    Brief Narrative:  Per HPI: The patient is a40 y.o.year-old w/ hx of CVA, HTN, HL, GI bleed, hx RA, DM2, anxiety/ depression, hx lymphoma 2021 currently on chemoRx. Pt presented today for rectal bleeding that started last night. She has had about 4 episodes of pain in the lower abdomen then passing 2 - 3 tbsp's of bright red blood per rectum. Not mixed in with feces. No voiding issues. No diarrhea or fevers. +lightheaded and fatigued than usual. Had 1st rd of chemo 1 mo ago and required admit afterwards for hematemesis, EGD showed esophagitis. Last chemo infusion was 04/26/20. ED spoke w/ GI on call , they recommend admission and they will see in am, tentatively planning for colonoscopy.   Assessment & Plan:   Principal Problem:   Lower GI bleeding Active Problems:   Essential hypertension, benign   Acquired hypothyroidism   Gastroesophageal reflux disease with esophagitis   CKD stage 3 secondary to diabetes (HCC)   Marginal zone lymphoma (HCC)   GI bleed   Anemia, mild   Active chemotherapy   Mild acute blood loss anemia suspected secondary to lower GI bleed -Mid sigmoid thickening per CT scan of unknown significance -Hemoglobin downward trending with plans to transfuse with hemoglobin less than 7 -Avoid anticoagulation -Appreciate GI evaluation -PPI twice daily  -Advance diet as tolerated -Plan for IV iron infusion this week at [facility]; may consider infusion while inpatient if no sign of infection noted -Patient had colonoscopy which showed signs concerning for ischemic colitis. -this was confirmed on biopsy -She underwent CT angiogram of the abdomen that did not show any significant proximal mesenteric arterial occlusive disease  Hypokalemia -Replete, magnesium also low and will be replaced  Mild hyponatremia -Change IV fluid to D5 NS -Improved  Prior  history of esophagitis/hematemesis -Recent EGD with esophagitis -Continue PPI twice daily and Carafate  CKD stage IIIa -Creatinine appears to be 1.3-1.6 previously, but is currently 0.84  Hypertension-stable -Continue Norvasc and monitor  Hypothyroidism -Continue Synthroid  NHL stage IIIa -Started on chemo with bendamustine/Rituxan 5 weeks ago -Second round was on 8/12  Pancytopenia -Likely related to recent chemotherapy -Hgb improved after transfusion of 1 unit prbc  Neutropenic fever -Patient developed fever overnight, white blood cell count down to 1.0.  Suspect that she also has neutropenia. -Blood cultures have been sent. -She is already on antibiotics.   DVT prophylaxis: SCDs Start: 04/30/20 2145  Code Status: Full code Family Communication: Discussed with daughter at the bedside Disposition Plan: Status is: Inpatient  Remains inpatient appropriate because:Ongoing diagnostic testing needed not appropriate for outpatient work up   Dispo: The patient is from: Home              Anticipated d/c is to: Home              Anticipated d/c date is: 1 day              Patient currently is not medically stable to d/c.    Consultants:   Gastroenterology  Procedures:  Colonoscopy:- One 8 mm polyp in the ascending colon, removed                            with a cold snare. Resected and retrieved.                           -  One 2 mm polyp in the ascending colon, removed                            with a cold biopsy forceps. Resected and retrieved.                           - Diverticulosis in the sigmoid colon, in the                            descending colon and in the transverse colon.                           - Mucosal ulceration in the colon and inflammation.                            Biopsied.                            - Non-bleeding internal hemorrhoids  Antimicrobials:   Ciprofloxacin 8/18 >  Flagyl 8/18 >   Subjective: Has had multiple  loose stools today. Po intake has been poor.  Objective: Vitals:   05/04/20 0421 05/04/20 0500 05/04/20 1551 05/04/20 1717  BP: (!) 128/49  (!) 113/49 123/70  Pulse: 74  74 84  Resp: 20  18 16   Temp: 99.9 F (37.7 C)  98.3 F (36.8 C) 98 F (36.7 C)  TempSrc:   Oral Oral  SpO2: 100%  99% 100%  Weight:  59.9 kg    Height:        Intake/Output Summary (Last 24 hours) at 05/04/2020 1801 Last data filed at 05/04/2020 1448 Gross per 24 hour  Intake 1860.39 ml  Output --  Net 1860.39 ml   Filed Weights   05/02/20 0500 05/03/20 0500 05/04/20 0500  Weight: 60.7 kg 59.9 kg 59.9 kg    Examination:  General exam: Alert, awake, oriented x 3 Respiratory system: Clear to auscultation. Respiratory effort normal. Cardiovascular system:RRR. No murmurs, rubs, gallops. Gastrointestinal system: Abdomen is nondistended, soft and nontender. No organomegaly or masses felt. Normal bowel sounds heard. Central nervous system: Alert and oriented. No focal neurological deficits. Extremities: No C/C/E, +pedal pulses Skin: No rashes, lesions or ulcers Psychiatry: Judgement and insight appear normal. Mood & affect appropriate.     Data Reviewed: I have personally reviewed following labs and imaging studies  CBC: Recent Labs  Lab 04/30/20 1440 04/30/20 2212 05/01/20 0358 05/01/20 0934 05/02/20 0651 05/03/20 0705 05/04/20 0822  WBC 4.4  --  3.2*  --  1.6* 1.0* 1.2*  NEUTROABS 4.0  --   --   --   --   --  0.6*  HGB 10.6*   < > 8.2* 8.5* 7.9* 7.4* 9.1*  HCT 31.7*   < > 24.5* 25.6* 23.4* 21.7* 26.4*  MCV 83.2  --  82.8  --  83.0 83.1 82.5  PLT 115*  --  89*  --  82* 90* 86*   < > = values in this interval not displayed.   Basic Metabolic Panel: Recent Labs  Lab 04/30/20 1440 05/01/20 0358 05/02/20 0651 05/03/20 0705 05/04/20 0822  NA 133* 132* 131* 136 134*  K 3.7 2.9* 3.0* 3.0* 2.8*  CL 101 101 106 107 106  CO2  20* 20* 18* 20* 20*  GLUCOSE 160* 137* 146* 115* 117*  BUN 21 16 6*  <5* 5*  CREATININE 1.45* 1.06* 0.94 0.84 0.91  CALCIUM 9.0 8.1* 8.0* 7.8* 7.7*  MG  --   --  1.1* 1.9 1.3*  PHOS  --   --   --  2.0*  --    GFR: Estimated Creatinine Clearance: 44 mL/min (by C-G formula based on SCr of 0.91 mg/dL). Liver Function Tests: Recent Labs  Lab 04/30/20 1440 05/03/20 0705  AST 19  --   ALT 13  --   ALKPHOS 50  --   BILITOT 0.7  --   PROT 6.8  --   ALBUMIN 3.8 2.7*   No results for input(s): LIPASE, AMYLASE in the last 168 hours. No results for input(s): AMMONIA in the last 168 hours. Coagulation Profile: Recent Labs  Lab 04/30/20 1440  INR 1.0   Cardiac Enzymes: No results for input(s): CKTOTAL, CKMB, CKMBINDEX, TROPONINI in the last 168 hours. BNP (last 3 results) No results for input(s): PROBNP in the last 8760 hours. HbA1C: No results for input(s): HGBA1C in the last 72 hours. CBG: Recent Labs  Lab 05/02/20 1503  GLUCAP 149*   Lipid Profile: No results for input(s): CHOL, HDL, LDLCALC, TRIG, CHOLHDL, LDLDIRECT in the last 72 hours. Thyroid Function Tests: No results for input(s): TSH, T4TOTAL, FREET4, T3FREE, THYROIDAB in the last 72 hours. Anemia Panel: No results for input(s): VITAMINB12, FOLATE, FERRITIN, TIBC, IRON, RETICCTPCT in the last 72 hours. Sepsis Labs: Recent Labs  Lab 04/30/20 1440 04/30/20 1700  LATICACIDVEN 2.3* 2.3*    Recent Results (from the past 240 hour(s))  Culture, blood (routine x 2)     Status: None (Preliminary result)   Collection Time: 04/30/20  3:12 PM   Specimen: Right Antecubital; Blood  Result Value Ref Range Status   Specimen Description RIGHT ANTECUBITAL  Final   Special Requests   Final    BOTTLES DRAWN AEROBIC AND ANAEROBIC Blood Culture adequate volume   Culture   Final    NO GROWTH 4 DAYS Performed at [facility], [address] [place], [place] [address]    Report Status PENDING  Incomplete  Culture, blood (routine x 2)     Status: None (Preliminary result)   Collection Time:  04/30/20  3:13 PM   Specimen: BLOOD RIGHT HAND  Result Value Ref Range Status   Specimen Description BLOOD RIGHT HAND  Final   Special Requests   Final    BOTTLES DRAWN AEROBIC AND ANAEROBIC Blood Culture adequate volume   Culture   Final    NO GROWTH 4 DAYS Performed at [facility], [address], [place], [place] [address]    Report Status PENDING  Incomplete  SARS Coronavirus 2 by RT PCR (hospital order, performed in [place] hospital lab) Nasopharyngeal Nasopharyngeal Swab     Status: None   Collection Time: 04/30/20  6:18 PM   Specimen: Nasopharyngeal Swab  Result Value Ref Range Status   SARS Coronavirus 2 NEGATIVE NEGATIVE Final    Comment: (NOTE) SARS-CoV-2 target nucleic acids are NOT DETECTED.  The SARS-CoV-2 RNA is generally detectable in upper and lower respiratory specimens during the acute phase of infection. The lowest concentration of SARS-CoV-2 viral copies this assay can detect is 250 copies / mL. A negative result does not preclude SARS-CoV-2 infection and should not be used as the sole basis for treatment or other patient management decisions.  A negative result may  occur with improper specimen collection / handling, submission of specimen other than nasopharyngeal swab, presence of viral mutation(s) within the areas targeted by this assay, and inadequate number of viral copies (<250 copies / mL). A negative result must be combined with clinical observations, patient history, and epidemiological information.  Fact Sheet for Patients:   StrictlyIdeas.no  Fact Sheet for Healthcare Providers: BankingDealers.co.za  This test is not yet approved or  cleared by the [place] FDA and has been authorized for detection and/or diagnosis of SARS-CoV-2 by FDA under an Emergency Use Authorization (EUA).  This EUA will remain in effect (meaning this test can be used) for the duration of the COVID-19 declaration under  Section 564(b)(1) of the Act, 21 U.S.C. section 360bbb-3(b)(1), unless the authorization is terminated or revoked sooner.  Performed at [facility], [address], [place], [place] [address]   Gastrointestinal Panel by PCR , Stool     Status: None   Collection Time: 05/03/20  5:27 PM   Specimen: Stool  Result Value Ref Range Status   Campylobacter species NOT DETECTED NOT DETECTED Final   Plesimonas shigelloides NOT DETECTED NOT DETECTED Final   Salmonella species NOT DETECTED NOT DETECTED Final   Yersinia enterocolitica NOT DETECTED NOT DETECTED Final   Vibrio species NOT DETECTED NOT DETECTED Final   Vibrio cholerae NOT DETECTED NOT DETECTED Final   Enteroaggregative E coli (EAEC) NOT DETECTED NOT DETECTED Final   Enteropathogenic E coli (EPEC) NOT DETECTED NOT DETECTED Final   Enterotoxigenic E coli (ETEC) NOT DETECTED NOT DETECTED Final   Shiga like toxin producing E coli (STEC) NOT DETECTED NOT DETECTED Final   Shigella/Enteroinvasive E coli (EIEC) NOT DETECTED NOT DETECTED Final   Cryptosporidium NOT DETECTED NOT DETECTED Final   Cyclospora cayetanensis NOT DETECTED NOT DETECTED Final   Entamoeba histolytica NOT DETECTED NOT DETECTED Final   Giardia lamblia NOT DETECTED NOT DETECTED Final   Adenovirus F40/41 NOT DETECTED NOT DETECTED Final   Astrovirus NOT DETECTED NOT DETECTED Final   Norovirus GI/GII NOT DETECTED NOT DETECTED Final   Rotavirus A NOT DETECTED NOT DETECTED Final   Sapovirus (I, II, IV, and V) NOT DETECTED NOT DETECTED Final    Comment: Performed at [facility], [address], [place], [place] [address]         Radiology Studies: CT Angio Abd/Pel w/ and/or w/o  Result Date: 05/03/2020 CLINICAL DATA:  Mesenteric ischemia, rectal bleeding, abdominal pain EXAM: CTA ABDOMEN AND PELVIS WITH CONTRAST TECHNIQUE: Multidetector CT imaging of the abdomen and pelvis was performed using the standard protocol during bolus administration of intravenous  contrast. Multiplanar reconstructed images and MIPs were obtained and reviewed to evaluate the vascular anatomy. CONTRAST:  153mL OMNIPAQUE IOHEXOL 350 MG/ML SOLN well-tolerated COMPARISON:  Noncontrast CT from earlier the same day, and earlier studies FINDINGS: VASCULAR Aorta: Extensive calcified atheromatous plaque particularly in the infrarenal segment. No aneurysm, dissection, or stenosis. Celiac: Calcified ostial plaque resulting in only mild stenosis, patent distally SMA: Calcified ostial plaque resulting in only mild stenosis, atheromatous but patent distally. Renals: Duplicated left, superior dominant with heavily calcified ostial plaque resulting in short segment stenosis of at least mild severity. Single right, with heavily calcified ostial plaque root extending over length of approximately 1.6 cm resulting in at least moderate stenosis, patent distally. IMA: Patent Inflow: Moderate calcified plaque involving bilateral common and internal iliac arteries, without high-grade stenosis. Proximal Outflow: Atheromatous, patent. Veins: Patent hepatic veins, portal vein, SMV, splenic vein, bilateral renal veins.  Iliac venous system and IVC unremarkable. No venous pathology identified. Review of the MIP images confirms the above findings. NON-VASCULAR Lower chest: Tip of central venous catheter noted at the cavoatrial junction. Coronary calcifications. No pleural or pericardial effusion. Linear subpleural scarring or atelectasis posteriorly in the lung bases. Hepatobiliary: No focal liver abnormality is seen. Status post cholecystectomy. No biliary dilatation. Pancreas: Unremarkable. No pancreatic ductal dilatation or surrounding inflammatory changes. Spleen: Normal in size without focal abnormality. Adrenals/Urinary Tract: Adrenal glands unremarkable. 1.5 cm exophytic cyst from the mid right kidney since 04/10/2011. No hydronephrosis. Urinary bladder physiologically distended. Stomach/Bowel: Stomach is  nondistended. Small bowel decompressed. Appendix not discretely identified. The colon is nondilated. There is circumferential wall thickening and mild enhancement in the mid sigmoid colon. Lymphatic: No mesenteric, abdominal or pelvic adenopathy. Reproductive: Status post hysterectomy. No adnexal masses. Other: Slight increase in small volume pelvic ascites.  No free air. Musculoskeletal: No acute or significant osseous findings. IMPRESSION: 1. No significant proximal mesenteric arterial occlusive disease to suggest etiology of mesenteric ischemia. 2. Circumferential wall thickening and mild enhancement in the mid sigmoid colon suggesting colitis. Consider colonoscopic follow-up to exclude mucosal lesion. 3. Bilateral renal artery ostial stenoses. 4. Coronary artery disease. Aortic Atherosclerosis (ICD10-I70.0). Electronically Signed   By: Lucrezia Europe M.D.   On: 05/03/2020 16:43        Scheduled Meds: . allopurinol  300 mg Oral Daily  . amLODipine  10 mg Oral Daily  . cholecalciferol  1,000 Units Oral Daily  . ciprofloxacin  500 mg Oral BID  . dicyclomine  10 mg Oral TID AC & HS  . fluconazole  150 mg Oral Daily  . levothyroxine  75 mcg Oral Daily  . magic mouthwash w/lidocaine  15 mL Oral TID  . metroNIDAZOLE  250 mg Oral TID PC  . pantoprazole  40 mg Oral BID  . rOPINIRole  0.25 mg Oral QHS  . sucralfate  1 g Oral TID WC & HS  . vitamin B-12  1,000 mcg Oral Daily   Continuous Infusions: . sodium chloride 10 mL/hr at 05/04/20 1222  . potassium chloride 10 mEq (05/04/20 1719)     LOS: 4 days    Time spent: 93mins    Kathie Dike, MD [facility]   If 7PM-7AM, please contact night-coverage [email or web address]  05/04/2020, 6:01 PM

## 2020-05-04 NOTE — Progress Notes (Signed)
Subjective:  Patient feels better.  She has mild pain in lower abdomen.  She has passed some flatus but has not had a bowel movement in last 24 hours.  She is not nauseated but does not have an appetite.  She denies shortness of breath.  Current Medications:  Current Facility-Administered Medications:  .  0.9 %  sodium chloride infusion, , Intravenous, Continuous, Adefeso, Oladapo, DO .  acetaminophen (TYLENOL) tablet 650 mg, 650 mg, Oral, Q6H PRN, Roney Jaffe, MD, 650 mg at 05/03/20 1424 .  albuterol (PROVENTIL) (2.5 MG/3ML) 0.083% nebulizer solution 2.5 mg, 2.5 mg, Nebulization, Q6H PRN, Roney Jaffe, MD .  allopurinol (ZYLOPRIM) tablet 300 mg, 300 mg, Oral, Daily, Roney Jaffe, MD, 300 mg at 05/03/20 0921 .  amLODipine (NORVASC) tablet 10 mg, 10 mg, Oral, Daily, Roney Jaffe, MD, 10 mg at 05/03/20 0921 .  bisacodyl (DULCOLAX) suppository 10 mg, 10 mg, Rectal, Daily PRN, Roney Jaffe, MD .  cholecalciferol (VITAMIN D3) tablet 1,000 Units, 1,000 Units, Oral, Daily, Roney Jaffe, MD, 1,000 Units at 05/03/20 [phone] .  ciprofloxacin (CIPRO) IVPB 400 mg, 400 mg, Intravenous, Q12H, Montez Morita, Quillian Quince, MD, Last Rate: 200 mL/hr at 05/04/20 0231, 400 mg at 05/04/20 0231 .  dextrose 5 %-0.9 % sodium chloride infusion, , Intravenous, Continuous, Heath Lark D, DO, Last Rate: 75 mL/hr at 05/04/20 0231, New Bag at 05/04/20 0231 .  dicyclomine (BENTYL) capsule 10 mg, 10 mg, Oral, TID AC & HS, Shah, Pratik D, DO, 10 mg at 05/03/20 2200 .  levothyroxine (SYNTHROID) tablet 75 mcg, 75 mcg, Oral, Daily, Roney Jaffe, MD, 75 mcg at 05/04/20 0554 .  magic mouthwash w/lidocaine, 15 mL, Oral, TID, Kathie Dike, MD, 15 mL at 05/03/20 2201 .  metroNIDAZOLE (FLAGYL) IVPB 500 mg, 500 mg, Intravenous, Q8H, Harvel Quale, MD, Last Rate: 100 mL/hr at 05/04/20 0554, 500 mg at 05/04/20 0554 .  ondansetron (ZOFRAN) tablet 4 mg, 4 mg, Oral, Q6H PRN **OR** ondansetron (ZOFRAN)  injection 4 mg, 4 mg, Intravenous, Q6H PRN, Roney Jaffe, MD .  pantoprazole (PROTONIX) EC tablet 40 mg, 40 mg, Oral, BID, Roney Jaffe, MD, 40 mg at 05/03/20 2201 .  polyethylene glycol (MIRALAX / GLYCOLAX) packet 17 g, 17 g, Oral, Daily PRN, Roney Jaffe, MD .  rOPINIRole (REQUIP) tablet 0.25 mg, 0.25 mg, Oral, QHS, Roney Jaffe, MD, 0.25 mg at 05/03/20 2201 .  sucralfate (CARAFATE) 1 GM/10ML suspension 1 g, 1 g, Oral, TID WC & HS, Shah, Pratik D, DO, 1 g at 05/03/20 2201 .  vitamin B-12 (CYANOCOBALAMIN) tablet 1,000 mcg, 1,000 mcg, Oral, Daily, Roney Jaffe, MD, 1,000 mcg at 05/03/20 7371  Objective: Blood pressure (!) 128/49, pulse 74, temperature 99.9 F (37.7 C), resp. rate 20, height $RemoveBe'5\' 4"'[id number]$  (1.626 m), weight 59.9 kg, SpO2 100 %. Patient is alert and in no acute distress Abdomen is symmetrical.  He has long horizontal scar across lower abdomen.  Bowel sounds are normal.  Palpation abdomen is soft.  She remains with mild tenderness in the right lower quadrant.  There is no rebound or guarding.  No organomegaly or masses.  Labs/studies Results:  CBC Latest Ref Rng & Units 05/03/2020 05/02/2020 05/01/2020  WBC 4.0 - 10.5 K/uL 1.0(LL) 1.6(L) -  Hemoglobin 12.0 - 15.0 g/dL 7.4(L) 7.9(L) 8.5(L)  Hematocrit 36 - 46 % 21.7(L) 23.4(L) 25.6(L)  Platelets 150 - 400 K/uL 90(L) 82(L) -    CMP Latest Ref Rng & Units 05/04/2020 05/03/2020 05/02/2020  Glucose 70 - 99 mg/dL 117(H) 115(H)  146(H)  BUN 8 - 23 mg/dL 5(L) <5(L) 6(L)  Creatinine 0.44 - 1.00 mg/dL 0.91 0.84 0.94  Sodium 135 - 145 mmol/L 134(L) 136 131(L)  Potassium 3.5 - 5.1 mmol/L 2.8(L) 3.0(L) 3.0(L)  Chloride 98 - 111 mmol/L 106 107 106  CO2 22 - 32 mmol/L 20(L) 20(L) 18(L)  Calcium 8.9 - 10.3 mg/dL 7.7(L) 7.8(L) 8.0(L)  Total Protein 6.5 - 8.1 g/dL - - -  Total Bilirubin 0.3 - 1.2 mg/dL - - -  Alkaline Phos 38 - 126 U/L - - -  AST 15 - 41 U/L - - -  ALT 0 - 44 U/L - - -    Hepatic Function Latest Ref Rng & Units  05/03/2020 04/30/2020 04/26/2020  Total Protein 6.5 - 8.1 g/dL - 6.8 6.9  Albumin 3.5 - 5.0 g/dL 2.7(L) 3.8 3.4(L)  AST 15 - 41 U/L - 19 15  ALT 0 - 44 U/L - 13 8  Alk Phosphatase 38 - 126 U/L - 50 58  Total Bilirubin 0.3 - 1.2 mg/dL - 0.7 0.5  Bilirubin, Direct <=0.2 mg/dL - - -   Labs from this morning are pending. Colon biopsy results pending.  Assessment:  #1.  Acute colitis.  She underwent colonoscopy 2 days ago revealing segmental colitis and watershed zone most likely due to ischemic injury.  Biopsies are pending.  Patient is on empiric antibiotic therapy.  #2.  Anemia secondary to chemotherapy made worse by GI bleed.  She received 1 unit of PRBCs yesterday.  H&H from this morning is pending.  #3.  Pancytopenia secondary to chemotherapy for lymphoma.  #4.  History of erosive reflux esophagitis.  She underwent EGD by Dr. Cristina Gong on 03/30/2020.  Esophageal biopsy revealed changes of reflux esophagitis but no CMV or EBV.  Recommendations  Change antibiotic to oral route. Wait for results of blood work and biopsy.

## 2020-05-04 NOTE — Care Management Important Message (Signed)
Important Message  Patient Details  Name: Elizabeth Mcintyre MRN: [id number] Date of Birth: 08-30-1941   Medicare Important Message Given:  Yes Izora Gala, RN was asked to deliver due to precautions)     Tommy Medal 05/04/2020, 3:47 PM

## 2020-05-05 DIAGNOSIS — K922 Gastrointestinal hemorrhage, unspecified: Secondary | ICD-10-CM

## 2020-05-05 LAB — BASIC METABOLIC PANEL
Anion gap: 7 (ref 5–15)
BUN: 6 mg/dL — ABNORMAL LOW (ref 8–23)
CO2: 20 mmol/L — ABNORMAL LOW (ref 22–32)
Calcium: 7.9 mg/dL — ABNORMAL LOW (ref 8.9–10.3)
Chloride: 106 mmol/L (ref 98–111)
Creatinine, Ser: 0.94 mg/dL (ref 0.44–1.00)
GFR calc Af Amer: >60 mL/min (ref >60)
GFR calc non Af Amer: 58 mL/min — ABNORMAL LOW (ref >60)
Glucose, Bld: 102 mg/dL — ABNORMAL HIGH (ref 70–99)
Potassium: 3.7 mmol/L (ref 3.5–5.1)
Sodium: 133 mmol/L — ABNORMAL LOW (ref 135–145)

## 2020-05-05 LAB — CULTURE, BLOOD (ROUTINE X 2)
Culture: NO GROWTH
Culture: NO GROWTH
Special Requests: ADEQUATE
Special Requests: ADEQUATE

## 2020-05-05 LAB — MAGNESIUM: Magnesium: 2.2 mg/dL (ref 1.7–2.4)

## 2020-05-05 MED ORDER — HEPARIN SOD (PORK) LOCK FLUSH 100 UNIT/ML IV SOLN: 500.0000 [IU] | INTRAVENOUS | Status: DC

## 2020-05-05 MED ORDER — METRONIDAZOLE 250 MG PO TABS
250.0000 mg | ORAL_TABLET | Freq: Three times a day (TID) | ORAL | 0 refills | Status: DC
Start: 2020-05-05 — End: 2020-06-01

## 2020-05-05 MED ORDER — FERUMOXYTOL INJECTION 510 MG/17 ML
INTRAVENOUS | Status: AC
  Filled 2020-05-05: qty 17

## 2020-05-05 MED ORDER — HEPARIN SOD (PORK) LOCK FLUSH 100 UNIT/ML IV SOLN
500.0000 [IU] | INTRAVENOUS | Status: DC | PRN
  Administered 2020-05-05: 500 [IU]
  Filled 2020-05-05: qty 5

## 2020-05-05 MED ORDER — LOPERAMIDE HCL 2 MG PO CAPS: 2.0000 mg | ORAL_CAPSULE | Freq: Four times a day (QID) | ORAL | 0 refills | Status: DC | PRN

## 2020-05-05 MED ORDER — CIPROFLOXACIN HCL 500 MG PO TABS: 500.0000 mg | ORAL_TABLET | Freq: Two times a day (BID) | ORAL | 0 refills | Status: DC

## 2020-05-05 MED ORDER — SODIUM CHLORIDE 0.9 % IV SOLN
510.0000 mg | Freq: Once | INTRAVENOUS | Status: AC
  Administered 2020-05-05: 510 mg via INTRAVENOUS
  Filled 2020-05-05: qty 17

## 2020-05-05 NOTE — Discharge Summary (Addendum)
Physician Discharge Summary  Elizabeth Mcintyre FAO:[id number] DOB: 28-Nov-1940 DOA: 04/30/2020  PCP: Ma Hillock, DO  Admit date: 04/30/2020 Discharge date: 05/05/2020  Admitted From: Home Disposition: Home  Recommendations for Outpatient Follow-up:  1. Follow up with PCP in 1-2 weeks 2. Please obtain BMP/CBC in one week 3. Follow-up with GI as an outpatient 4. Follow-up with oncology as previously scheduled  Home Health: Equipment/Devices:  Discharge Condition: Stable CODE STATUS: Full code Diet recommendation: Heart healthy  Brief/Interim Summary: Per HPI: The patient is a33 y.o.year-old w/ hx of CVA, HTN, HL, GI bleed, hx RA, DM2, anxiety/ depression, hx lymphoma 2021 currently on chemoRx. Pt presented today for rectal bleeding that started last night. She has had about 4 episodes of pain in the lower abdomen then passing 2 - 3 tbsp's of bright red blood per rectum. Not mixed in with feces. No voiding issues. No diarrhea or fevers. +lightheaded and fatigued than usual. Had 1st rd of chemo 1 mo ago and required admit afterwards for hematemesis, EGD showed esophagitis. Last chemo infusion was 04/26/20. ED spoke w/ GI on call , they recommend admission and they will see in am, tentatively planning for colonoscopy.  Discharge Diagnoses:  Principal Problem:   Lower GI bleeding Active Problems:   Essential hypertension, benign   Acquired hypothyroidism   Gastroesophageal reflux disease with esophagitis   CKD stage 3 secondary to diabetes (HCC)   Marginal zone lymphoma (HCC)   GI bleed   Anemia, mild   Active chemotherapy  Mild acute blood loss anemia suspected secondary to lower GI bleed -Mid sigmoid thickening per CT scan of unknown significance -Hemoglobin downward trending with plans to transfuse with hemoglobin less than 7 -Avoid anticoagulation -Appreciate GI evaluation -PPI twice daily -Advance diet as tolerated -Plan for [facility] infusion this [facility] at [facility] consider infusion while inpatient if no sign of infection noted -Patient had colonoscopy which showed signs concerning for ischemic colitis. -this was confirmed on biopsy -She underwent CT angiogram of the abdomen that did not show any significant proximal mesenteric arterial occlusive disease -Stool studies found to be negative -Per GI, will complete 10 days of antibiotics. -Use Imodium for diarrhea.  Hypokalemia -Repleted, magnesium also low and will be replaced  Mild hyponatremia -Change IV fluid to D5 NS -Improved  Prior history of esophagitis/hematemesis -Recent EGD with esophagitis -Continue PPI twice daily and Carafate  CKD stage IIIa -Creatinine appears to be 1.3-1.6previously, but is currently 0.84  Hypertension-stable -Continue Norvasc and monitor  Hypothyroidism -Continue Synthroid  NHL stage IIIa -Started on chemo with bendamustine/Rituxan 5 weeks ago -Second round was on 8/12  Pancytopenia -Likely related to recent chemotherapy -Hgb improved after transfusion of 1 unit prbc -Patient also received second dose of IV iron in the hospital which had been scheduled as an outpatient  Neutropenic fever -Patient developed fever during her hospital stay, white blood cell count down to 1.0.  Suspect that she also has neutropenia. -Blood cultures have shown no growth to date. -She did not have any recurrence of fevers. -She is already on antibiotics.  Discharge Instructions  Discharge Instructions    Diet - low sodium heart healthy   Complete by: As directed    Increase activity slowly   Complete by: As directed      Allergies as of 05/05/2020      Reactions   Codeine Phosphate Nausea And Vomiting, Rash      Medication List    TAKE these medications  acetaminophen 325 MG tablet Commonly known as: TYLENOL Take 650 mg by mouth every 6 (six) hours as needed for moderate pain.   albuterol 108 (90 Base) MCG/ACT inhaler Commonly known  as: VENTOLIN HFA Inhale 2 puffs into the lungs every 6 (six) hours as needed for wheezing.   allopurinol 300 MG tablet Commonly known as: ZYLOPRIM Take 1 tablet (300 mg total) by mouth daily.   amLODipine 10 MG tablet Commonly known as: NORVASC Take 1 tablet (10 mg total) by mouth daily.   ciprofloxacin 500 MG tablet Commonly known as: CIPRO Take 1 tablet (500 mg total) by mouth 2 (two) times daily.   dicyclomine 10 MG capsule Commonly known as: BENTYL Take 1 capsule (10 mg total) by mouth 4 (four) times daily -  before meals and at bedtime. Needs appt for further refills. What changed:   when to take this  reasons to take this  additional instructions   levothyroxine 75 MCG tablet Commonly known as: SYNTHROID Take 1 tablet (75 mcg total) by mouth daily.   lidocaine-prilocaine cream Commonly known as: EMLA Apply 1 application topically as needed.   loperamide 2 MG capsule Commonly known as: IMODIUM Take 1 capsule (2 mg total) by mouth every 6 (six) hours as needed for diarrhea or loose stools.   metroNIDAZOLE 250 MG tablet Commonly known as: FLAGYL Take 1 tablet (250 mg total) by mouth 3 (three) times daily after meals.   ondansetron 8 MG tablet Commonly known as: ZOFRAN Take 1 tablet (8 mg total) by mouth every 8 (eight) hours as needed for nausea or vomiting.   pantoprazole 40 MG tablet Commonly known as: Protonix Take 1 tablet (40 mg total) by mouth 2 (two) times daily. After 8 weeks take only once daily.   potassium chloride SA 20 MEQ tablet Commonly known as: KLOR-CON Take 1 tablet (20 mEq total) by mouth daily.   prochlorperazine 10 MG tablet Commonly known as: COMPAZINE Take 1 tablet (10 mg total) by mouth every 6 (six) hours as needed for nausea or vomiting.   rOPINIRole 0.25 MG tablet Commonly known as: Requip Take 1 tablet (0.25 mg total) by mouth at bedtime.   sucralfate 1 GM/10ML suspension Commonly known as: CARAFATE Take 10 mLs (1 g total)  by mouth 4 (four) times daily as needed (for throat/mouth burning).   vitamin B-12 1000 MCG tablet Commonly known as: CYANOCOBALAMIN Take 1,000 mcg by mouth daily.   Vitamin D3 25 MCG (1000 UT) Caps Take 1,000 Units by mouth daily.            Durable Medical Equipment  (From admission, onward)         Start     Ordered   05/03/20 1118  For home use only DME Hospital bed  Once       Question Answer Comment  Length of Need Lifetime   Patient has (list medical condition): Active chemo for lymphoma   The above medical condition requires: Patient requires the ability to reposition frequently   Bed type Semi-electric   Support Surface: Gel Overlay      05/03/20 [place] Follow up.   Why:   RN-  they will call within 48 hours of discharge to sit up an appointment.             Allergies  Allergen Reactions   Codeine Phosphate Nausea And Vomiting  and Rash    Consultations:  Gastroenterology   Procedures/Studies: CT Abdomen Pelvis Wo Contrast  Result Date: 04/30/2020 CLINICAL DATA:  Rectal bleeding and bloody stool. EXAM: CT ABDOMEN AND PELVIS WITHOUT CONTRAST TECHNIQUE: Multidetector CT imaging of the abdomen and pelvis was performed following the standard protocol without IV contrast. COMPARISON:  March 31, 2019 FINDINGS: Lower chest: No acute abnormality. Hepatobiliary: No focal liver abnormality is seen. Status post cholecystectomy. No biliary dilatation. Pancreas: Unremarkable. No pancreatic ductal dilatation or surrounding inflammatory changes. Spleen: Normal in size without focal abnormality. Adrenals/Urinary Tract: Adrenal glands are unremarkable. Kidneys are normal in size, without renal calculi or hydronephrosis. A 1.5 cm diameter exophytic cyst is seen along the lateral aspect of the mid right kidney. Bladder is unremarkable. Stomach/Bowel: Stomach is within normal limits. Appendix appears normal. A  small amount of retained barium is seen within the distal transverse colon. No evidence of bowel dilatation. Marked severity thickening of the mid sigmoid colon is seen. A mild amount of pericolonic inflammatory fat stranding is also noted. Vascular/Lymphatic: There is marked severity calcification of the abdominal aorta and bilateral common iliac arteries, without evidence of aneurysmal dilatation. No enlarged abdominal or pelvic lymph nodes. Reproductive: Status post hysterectomy. No adnexal masses. Other: No abdominal wall hernia or abnormality. No abdominopelvic ascites. Musculoskeletal: No acute or significant osseous findings. IMPRESSION: 1. Marked severity thickening of the mid sigmoid colon with a mild amount of pericolonic inflammatory fat stranding. While this may represent sequelae associated with an acute infectious or inflammatory colitis, the presence of an underlying neoplastic process cannot be excluded. Correlation with colonoscopy is recommended. 2. Evidence of prior cholecystectomy and hysterectomy. 3. Aortic atherosclerosis. Aortic Atherosclerosis (ICD10-I70.0). Electronically Signed   By: Virgina Norfolk M.D.   On: 04/30/2020 17:24   DG ESOPHAGUS W DOUBLE CM (HD)  Result Date: 04/23/2020 CLINICAL DATA:  Dysphagia. Reflux. History of stroke in [place]. History of prior esophageal dilation in 2016. EXAM: ESOPHOGRAM / BARIUM SWALLOW / BARIUM TABLET STUDY TECHNIQUE: Combined double contrast and single contrast examination performed using effervescent crystals, thick barium liquid, and thin barium liquid. The patient was observed with fluoroscopy swallowing a 13 mm barium sulphate tablet. FLUOROSCOPY TIME:  Fluoroscopy Time:  1 minutes, 54 seconds Radiation Exposure Index (if provided by the fluoroscopic device): 13.4 mGy Number of Acquired Spot Images: 0 COMPARISON:  CT chest 03/30/2020 FINDINGS: Normal pharyngeal phase of swallowing. Primary peristaltic waves in the esophagus were normal on 03/04  swallows. Small type 1 hiatal hernia as shown on image 21/4. There is a widely patent distal esophageal mucosal ring shown on image 20/4. Just above this, in the distal esophagus, there is a smoothly marginated focal narrowing/stricture measuring over about 0.4 cm in length. I was only able to distend this up to about 1.0 cm in diameter, but on the other hand a 13 mm barium tablet was able to pass through this region without difficulty, and pass into the stomach. There is no significant irregularity along this narrowing. Right-sided Port-A-Cath noted.  Atherosclerotic aortic arch. IMPRESSION: 1. Small type 1 hiatal hernia. 2. Smoothly marginated focal narrowing/stricture in the distal esophagus just above the level of the Schatzki ring, only able to distend up to about 1.0 cm in diameter. However, a 13 mm barium tablet passed through this narrowing without impaction. 3. Widely patent distal esophageal mucosal ring. 4. Right-sided Port-A-Cath noted. 5.  Aortic Atherosclerosis (ICD10-I70.0). Electronically Signed   By: Van Clines M.D.   On: 04/23/2020 09:56  CT Angio Abd/Pel w/ and/or w/o  Result Date: 05/03/2020 CLINICAL DATA:  Mesenteric ischemia, rectal bleeding, abdominal pain EXAM: CTA ABDOMEN AND PELVIS WITH CONTRAST TECHNIQUE: Multidetector CT imaging of the abdomen and pelvis was performed using the standard protocol during bolus administration of intravenous contrast. Multiplanar reconstructed images and MIPs were obtained and reviewed to evaluate the vascular anatomy. CONTRAST:  118mL OMNIPAQUE IOHEXOL 350 MG/ML SOLN well-tolerated COMPARISON:  Noncontrast CT from earlier the same day, and earlier studies FINDINGS: VASCULAR Aorta: Extensive calcified atheromatous plaque particularly in the infrarenal segment. No aneurysm, dissection, or stenosis. Celiac: Calcified ostial plaque resulting in only mild stenosis, patent distally SMA: Calcified ostial plaque resulting in only mild stenosis,  atheromatous but patent distally. Renals: Duplicated left, superior dominant with heavily calcified ostial plaque resulting in short segment stenosis of at least mild severity. Single right, with heavily calcified ostial plaque root extending over length of approximately 1.6 cm resulting in at least moderate stenosis, patent distally. IMA: Patent Inflow: Moderate calcified plaque involving bilateral common and internal iliac arteries, without high-grade stenosis. Proximal Outflow: Atheromatous, patent. Veins: Patent hepatic veins, portal vein, SMV, splenic vein, bilateral renal veins. Iliac venous system and IVC unremarkable. No venous pathology identified. Review of the MIP images confirms the above findings. NON-VASCULAR Lower chest: Tip of central venous catheter noted at the cavoatrial junction. Coronary calcifications. No pleural or pericardial effusion. Linear subpleural scarring or atelectasis posteriorly in the lung bases. Hepatobiliary: No focal liver abnormality is seen. Status post cholecystectomy. No biliary dilatation. Pancreas: Unremarkable. No pancreatic ductal dilatation or surrounding inflammatory changes. Spleen: Normal in size without focal abnormality. Adrenals/Urinary Tract: Adrenal glands unremarkable. 1.5 cm exophytic cyst from the mid right kidney since 04/10/2011. No hydronephrosis. Urinary bladder physiologically distended. Stomach/Bowel: Stomach is nondistended. Small bowel decompressed. Appendix not discretely identified. The colon is nondilated. There is circumferential wall thickening and mild enhancement in the mid sigmoid colon. Lymphatic: No mesenteric, abdominal or pelvic adenopathy. Reproductive: Status post hysterectomy. No adnexal masses. Other: Slight increase in small volume pelvic ascites.  No free air. Musculoskeletal: No acute or significant osseous findings. IMPRESSION: 1. No significant proximal mesenteric arterial occlusive disease to suggest etiology of mesenteric  ischemia. 2. Circumferential wall thickening and mild enhancement in the mid sigmoid colon suggesting colitis. Consider colonoscopic follow-up to exclude mucosal lesion. 3. Bilateral renal artery ostial stenoses. 4. Coronary artery disease. Aortic Atherosclerosis (ICD10-I70.0). Electronically Signed   By: Lucrezia Europe M.D.   On: 05/03/2020 16:43      Subjective: Overall diarrhea has improved with Imodium.  No nausea or vomiting.  Was able to tolerate solid diet.  Discharge Exam: Vitals:   05/04/20 2056 05/05/20 0500 05/05/20 0603 05/05/20 1505  BP: 138/65  (!) 114/45 (!) 121/57  Pulse: 78  72 84  Resp: 18  18 16   Temp: 98.9 F (37.2 C)  98.6 F (37 C) 98.2 F (36.8 C)  TempSrc: Oral  Oral Oral  SpO2: 100%  99% 98%  Weight:  58.7 kg    Height:        General: Pt is alert, awake, not in acute distress Cardiovascular: RRR, S1/S2 +, no rubs, no gallops Respiratory: CTA bilaterally, no wheezing, no rhonchi Abdominal: Soft, NT, ND, bowel sounds + Extremities: no edema, no cyanosis    The results of significant diagnostics from this hospitalization (including imaging, microbiology, ancillary and laboratory) are listed below for reference.     Microbiology: Recent Results (from the past 240 hour(s))  Culture, blood (routine  x 2)     Status: None   Collection Time: 04/30/20  3:12 PM   Specimen: Right Antecubital; Blood  Result Value Ref Range Status   Specimen Description RIGHT ANTECUBITAL  Final   Special Requests   Final    BOTTLES DRAWN AEROBIC AND ANAEROBIC Blood Culture adequate volume   Culture   Final    NO GROWTH 5 DAYS Performed at [facility], [address], [place], [place] [address]    Report Status 05/05/2020 FINAL  Final  Culture, blood (routine x 2)     Status: None   Collection Time: 04/30/20  3:13 PM   Specimen: BLOOD RIGHT HAND  Result Value Ref Range Status   Specimen Description BLOOD RIGHT HAND  Final   Special Requests   Final    BOTTLES DRAWN  AEROBIC AND ANAEROBIC Blood Culture adequate volume   Culture   Final    NO GROWTH 5 DAYS Performed at [facility], [address], [place], [place] [address]    Report Status 05/05/2020 FINAL  Final  SARS Coronavirus 2 by RT PCR (hospital order, performed in [facility] hospital lab) Nasopharyngeal Nasopharyngeal Swab     Status: None   Collection Time: 04/30/20  6:18 PM   Specimen: Nasopharyngeal Swab  Result Value Ref Range Status   SARS Coronavirus 2 NEGATIVE NEGATIVE Final    Comment: (NOTE) SARS-CoV-2 target nucleic acids are NOT DETECTED.  The SARS-CoV-2 RNA is generally detectable in upper and lower respiratory specimens during the acute phase of infection. The lowest concentration of SARS-CoV-2 viral copies this assay can detect is 250 copies / mL. A negative result does not preclude SARS-CoV-2 infection and should not be used as the sole basis for treatment or other patient management decisions.  A negative result may occur with improper specimen collection / handling, submission of specimen other than nasopharyngeal swab, presence of viral mutation(s) within the areas targeted by this assay, and inadequate number of viral copies (<250 copies / mL). A negative result must be combined with clinical observations, patient history, and epidemiological information.  Fact Sheet for Patients:   StrictlyIdeas.no  Fact Sheet for Healthcare Providers: BankingDealers.co.za  This test is not yet approved or  cleared by the [place] FDA and has been authorized for detection and/or diagnosis of SARS-CoV-2 by FDA under an Emergency Use Authorization (EUA).  This EUA will remain in effect (meaning this test can be used) for the duration of the COVID-19 declaration under Section 564(b)(1) of the Act, 21 U.S.C. section 360bbb-3(b)(1), unless the authorization is terminated or revoked sooner.  Performed at [facility], [address], [place],  [address]   Gastrointestinal Panel by PCR , Stool     Status: None   Collection Time: 05/03/20  5:27 PM   Specimen: Stool  Result Value Ref Range Status   Campylobacter species NOT DETECTED NOT DETECTED Final   Plesimonas shigelloides NOT DETECTED NOT DETECTED Final   Salmonella species NOT DETECTED NOT DETECTED Final   Yersinia enterocolitica NOT DETECTED NOT DETECTED Final   Vibrio species NOT DETECTED NOT DETECTED Final   Vibrio cholerae NOT DETECTED NOT DETECTED Final   Enteroaggregative E coli (EAEC) NOT DETECTED NOT DETECTED Final   Enteropathogenic E coli (EPEC) NOT DETECTED NOT DETECTED Final   Enterotoxigenic E coli (ETEC) NOT DETECTED NOT DETECTED Final   Shiga like toxin producing E coli (STEC) NOT DETECTED NOT DETECTED Final   Shigella/Enteroinvasive E coli (EIEC) NOT DETECTED NOT DETECTED  Final   Cryptosporidium NOT DETECTED NOT DETECTED Final   Cyclospora cayetanensis NOT DETECTED NOT DETECTED Final   Entamoeba histolytica NOT DETECTED NOT DETECTED Final   Giardia lamblia NOT DETECTED NOT DETECTED Final   Adenovirus F40/41 NOT DETECTED NOT DETECTED Final   Astrovirus NOT DETECTED NOT DETECTED Final   Norovirus GI/GII NOT DETECTED NOT DETECTED Final   Rotavirus A NOT DETECTED NOT DETECTED Final   Sapovirus (I, II, IV, and V) NOT DETECTED NOT DETECTED Final    Comment: Performed at [facility], [address] [place], [place] [address]     Labs: BNP (last 3 results) No results for input(s): BNP in the last 8760 hours. Basic Metabolic Panel: Recent Labs  Lab 05/01/20 0358 05/02/20 0651 05/03/20 0705 05/04/20 0822 05/05/20 0647  NA 132* 131* 136 134* 133*  K 2.9* 3.0* 3.0* 2.8* 3.7  CL 101 106 107 106 106  CO2 20* 18* 20* 20* 20*  GLUCOSE 137* 146* 115* 117* 102*  BUN 16 6* <5* 5* 6*  CREATININE 1.06* 0.94 0.84 0.91 0.94  CALCIUM 8.1* 8.0* 7.8* 7.7* 7.9*  MG  --  1.1* 1.9 1.3* 2.2  PHOS  --   --  2.0*  --   --    Liver Function  Tests: Recent Labs  Lab 04/30/20 1440 05/03/20 0705  AST 19  --   ALT 13  --   ALKPHOS 50  --   BILITOT 0.7  --   PROT 6.8  --   ALBUMIN 3.8 2.7*   No results for input(s): LIPASE, AMYLASE in the last 168 hours. No results for input(s): AMMONIA in the last 168 hours. CBC: Recent Labs  Lab 04/30/20 1440 04/30/20 2212 05/01/20 0358 05/01/20 0934 05/02/20 0651 05/03/20 0705 05/04/20 0822  WBC 4.4  --  3.2*  --  1.6* 1.0* 1.2*  NEUTROABS 4.0  --   --   --   --   --  0.6*  HGB 10.6*   < > 8.2* 8.5* 7.9* 7.4* 9.1*  HCT 31.7*   < > 24.5* 25.6* 23.4* 21.7* 26.4*  MCV 83.2  --  82.8  --  83.0 83.1 82.5  PLT 115*  --  89*  --  82* 90* 86*   < > = values in this interval not displayed.   Cardiac Enzymes: No results for input(s): CKTOTAL, CKMB, CKMBINDEX, TROPONINI in the last 168 hours. BNP: Invalid input(s): POCBNP CBG: Recent Labs  Lab 05/02/20 1503  GLUCAP 149*   D-Dimer No results for input(s): DDIMER in the last 72 hours. Hgb A1c No results for input(s): HGBA1C in the last 72 hours. Lipid Profile No results for input(s): CHOL, HDL, LDLCALC, TRIG, CHOLHDL, LDLDIRECT in the last 72 hours. Thyroid function studies No results for input(s): TSH, T4TOTAL, T3FREE, THYROIDAB in the last 72 hours.  Invalid input(s): FREET3 Anemia work up No results for input(s): VITAMINB12, FOLATE, FERRITIN, TIBC, IRON, RETICCTPCT in the last 72 hours. Urinalysis    Component Value Date/Time   COLORURINE YELLOW 04/30/2020 1936   APPEARANCEUR CLEAR 04/30/2020 1936   LABSPEC 1.012 04/30/2020 1936   PHURINE 6.0 04/30/2020 1936   GLUCOSEU NEGATIVE 04/30/2020 1936   GLUCOSEU NEGATIVE 10/15/2015 1309   HGBUR SMALL (A) 04/30/2020 1936   HGBUR trace-intact 11/26/2010 1021   BILIRUBINUR NEGATIVE 04/30/2020 1936   BILIRUBINUR neg 06/06/2016 0932   KETONESUR NEGATIVE 04/30/2020 1936   PROTEINUR NEGATIVE 04/30/2020 1936   UROBILINOGEN 0.2 06/06/2016 0932   UROBILINOGEN 0.2  10/15/2015 1309    NITRITE NEGATIVE 04/30/2020 1936   LEUKOCYTESUR NEGATIVE 04/30/2020 1936   Sepsis Labs Invalid input(s): PROCALCITONIN,  WBC,  LACTICIDVEN Microbiology Recent Results (from the past 240 hour(s))  Culture, blood (routine x 2)     Status: None   Collection Time: 04/30/20  3:12 PM   Specimen: Right Antecubital; Blood  Result Value Ref Range Status   Specimen Description RIGHT ANTECUBITAL  Final   Special Requests   Final    BOTTLES DRAWN AEROBIC AND ANAEROBIC Blood Culture adequate volume   Culture   Final    NO GROWTH 5 DAYS Performed at [facility], [address] [place], [place] [address]    Report Status 05/05/2020 FINAL  Final  Culture, blood (routine x 2)     Status: None   Collection Time: 04/30/20  3:13 PM   Specimen: BLOOD RIGHT HAND  Result Value Ref Range Status   Specimen Description BLOOD RIGHT HAND  Final   Special Requests   Final    BOTTLES DRAWN AEROBIC AND ANAEROBIC Blood Culture adequate volume   Culture   Final    NO GROWTH 5 DAYS Performed at [facility], [address], [place], [place] [address]    Report Status 05/05/2020 FINAL  Final  SARS Coronavirus 2 by RT PCR (hospital order, performed in [facility] hospital lab) Nasopharyngeal Nasopharyngeal Swab     Status: None   Collection Time: 04/30/20  6:18 PM   Specimen: Nasopharyngeal Swab  Result Value Ref Range Status   SARS Coronavirus 2 NEGATIVE NEGATIVE Final    Comment: (NOTE) SARS-CoV-2 target nucleic acids are NOT DETECTED.  The SARS-CoV-2 RNA is generally detectable in upper and lower respiratory specimens during the acute phase of infection. The lowest concentration of SARS-CoV-2 viral copies this assay can detect is 250 copies / mL. A negative result does not preclude SARS-CoV-2 infection and should not be used as the sole basis for treatment or other patient management decisions.  A negative result may occur with improper specimen collection / handling, submission of specimen  other than nasopharyngeal swab, presence of viral mutation(s) within the areas targeted by this assay, and inadequate number of viral copies (<250 copies / mL). A negative result must be combined with clinical observations, patient history, and epidemiological information.  Fact Sheet for Patients:   StrictlyIdeas.no  Fact Sheet for Healthcare Providers: BankingDealers.co.za  This test is not yet approved or  cleared by the [place] FDA and has been authorized for detection and/or diagnosis of SARS-CoV-2 by FDA under an Emergency Use Authorization (EUA).  This EUA will remain in effect (meaning this test can be used) for the duration of the COVID-19 declaration under Section 564(b)(1) of the Act, 21 U.S.C. section 360bbb-3(b)(1), unless the authorization is terminated or revoked sooner.  Performed at [facility], [address] [place], [place] [address]   Gastrointestinal Panel by PCR , Stool     Status: None   Collection Time: 05/03/20  5:27 PM   Specimen: Stool  Result Value Ref Range Status   Campylobacter species NOT DETECTED NOT DETECTED Final   Plesimonas shigelloides NOT DETECTED NOT DETECTED Final   Salmonella species NOT DETECTED NOT DETECTED Final   Yersinia enterocolitica NOT DETECTED NOT DETECTED Final   Vibrio species NOT DETECTED NOT DETECTED Final   Vibrio cholerae NOT DETECTED NOT DETECTED Final   Enteroaggregative E coli (EAEC) NOT DETECTED NOT DETECTED Final   Enteropathogenic E coli (EPEC) NOT DETECTED NOT  DETECTED Final   Enterotoxigenic E coli (ETEC) NOT DETECTED NOT DETECTED Final   Shiga like toxin producing E coli (STEC) NOT DETECTED NOT DETECTED Final   Shigella/Enteroinvasive E coli (EIEC) NOT DETECTED NOT DETECTED Final   Cryptosporidium NOT DETECTED NOT DETECTED Final   Cyclospora cayetanensis NOT DETECTED NOT DETECTED Final   Entamoeba histolytica NOT DETECTED NOT DETECTED Final   Giardia lamblia  NOT DETECTED NOT DETECTED Final   Adenovirus F40/41 NOT DETECTED NOT DETECTED Final   Astrovirus NOT DETECTED NOT DETECTED Final   Norovirus GI/GII NOT DETECTED NOT DETECTED Final   Rotavirus A NOT DETECTED NOT DETECTED Final   Sapovirus (I, II, IV, and V) NOT DETECTED NOT DETECTED Final    Comment: Performed at [facility], [address], [place], [place] [address]     Time coordinating discharge: 34mins  SIGNED:   Kathie Dike, MD  [facility] 05/05/2020, 8:10 PM   If 7PM-7AM, please contact night-coverage [email or web address]

## 2020-05-05 NOTE — Progress Notes (Signed)
Patient states understanding of discharge instructions.  

## 2020-05-05 NOTE — Progress Notes (Signed)
Subjective: Patient tells me her pain continues to improve though she still has a mild amount in her left lower quadrant.  Has passed some flatus.  Denies any nausea or vomiting.  She is currently eating a salad during my interview and exam.  Does note decreased appetite for months now.  No further rectal bleeding.  Objective: Vital signs in last 24 hours: Temp:  [98 F (36.7 C)-98.9 F (37.2 C)] 98.6 F (37 C) (08/21 0603) Pulse Rate:  [72-84] 72 (08/21 0603) Resp:  [16-18] 18 (08/21 0603) BP: (113-138)/(45-70) 114/45 (08/21 0603) SpO2:  [99 %-100 %] 99 % (08/21 0603) Weight:  [58.7 kg] 58.7 kg (08/21 0500) Last BM Date: 05/04/20 General:   Alert and oriented, pleasant Head:  Normocephalic and atraumatic. Eyes:  No icterus, sclera clear. Conjuctiva pink.  Mouth:  Without lesions, mucosa pink and moist.  Neck:  Supple, without thyromegaly or masses.  Heart:  S1, S2 present, no murmurs noted.  Lungs: Clear to auscultation bilaterally, without wheezing, rales, or rhonchi.  Abdomen:  Bowel sounds present, soft, mildly tender to palpation left lower quadrant, non-distended. No HSM or hernias noted. No rebound or guarding. No masses appreciated  Msk:  Symmetrical without gross deformities. Normal posture. Pulses:  Normal pulses noted. Extremities:  Without clubbing or edema. Neurologic:  Alert and  oriented x4;  grossly normal neurologically. Skin:  Warm and dry, intact without significant lesions.  Cervical Nodes:  No significant cervical adenopathy. Psych:  Alert and cooperative. Normal mood and affect.  Intake/Output from previous day: 08/20 0701 - 08/21 0700 In: 286.2 [P.O.:200; I.V.:86.2] Out: -  Intake/Output this shift: Total I/O In: 240 [P.O.:240] Out: -   Lab Results: Recent Labs    05/03/20 0705 05/04/20 0822  WBC 1.0* 1.2*  HGB 7.4* 9.1*  HCT 21.7* 26.4*  PLT 90* 86*   BMET Recent Labs    05/03/20 0705 05/04/20 0822 05/05/20 0647  NA 136 134* 133*  K  3.0* 2.8* 3.7  CL 107 106 106  CO2 20* 20* 20*  GLUCOSE 115* 117* 102*  BUN <5* 5* 6*  CREATININE 0.84 0.91 0.94  CALCIUM 7.8* 7.7* 7.9*   LFT Recent Labs    05/03/20 0705  ALBUMIN 2.7*   PT/INR No results for input(s): LABPROT, INR in the last 72 hours. Hepatitis Panel No results for input(s): HEPBSAG, HCVAB, HEPAIGM, HEPBIGM in the last 72 hours.   Studies/Results: No results found.  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  CTA showed no significant proximal mesenteric arterial occlusive disease to suggest etiology of mesenteric ischemia.  Did note wall thickening in the sigmoid colon consistent with colitis which was seen on colonoscopy. -Continue on supportive measures for now.  Patient appears to be  improving clinically. -Continue on ciprofloxacin and Flagyl for a total of 10 days -Tolerating diabetic diet    LOS: 5 days    05/05/2020, 1:45 PM

## 2020-05-06 ENCOUNTER — Other Ambulatory Visit: Payer: Self-pay | Admitting: Internal Medicine

## 2020-05-06 DIAGNOSIS — R531 Weakness: Secondary | ICD-10-CM

## 2020-05-07 ENCOUNTER — Telehealth: Payer: Self-pay | Admitting: *Deleted

## 2020-05-07 ENCOUNTER — Telehealth: Payer: Self-pay | Admitting: Family Medicine

## 2020-05-07 ENCOUNTER — Other Ambulatory Visit: Payer: Self-pay | Admitting: *Deleted

## 2020-05-07 DIAGNOSIS — K648 Other hemorrhoids: Secondary | ICD-10-CM | POA: Diagnosis not present

## 2020-05-07 DIAGNOSIS — D62 Acute posthemorrhagic anemia: Secondary | ICD-10-CM | POA: Diagnosis not present

## 2020-05-07 DIAGNOSIS — E1122 Type 2 diabetes mellitus with diabetic chronic kidney disease: Secondary | ICD-10-CM | POA: Diagnosis not present

## 2020-05-07 DIAGNOSIS — I129 Hypertensive chronic kidney disease with stage 1 through stage 4 chronic kidney disease, or unspecified chronic kidney disease: Secondary | ICD-10-CM | POA: Diagnosis not present

## 2020-05-07 DIAGNOSIS — C8300 Small cell B-cell lymphoma, unspecified site: Secondary | ICD-10-CM | POA: Diagnosis not present

## 2020-05-07 DIAGNOSIS — D631 Anemia in chronic kidney disease: Secondary | ICD-10-CM | POA: Diagnosis not present

## 2020-05-07 DIAGNOSIS — Z452 Encounter for adjustment and management of vascular access device: Secondary | ICD-10-CM | POA: Diagnosis not present

## 2020-05-07 DIAGNOSIS — D709 Neutropenia, unspecified: Secondary | ICD-10-CM | POA: Diagnosis not present

## 2020-05-07 DIAGNOSIS — D61818 Other pancytopenia: Secondary | ICD-10-CM | POA: Diagnosis not present

## 2020-05-07 DIAGNOSIS — K5730 Diverticulosis of large intestine without perforation or abscess without bleeding: Secondary | ICD-10-CM | POA: Diagnosis not present

## 2020-05-07 DIAGNOSIS — K922 Gastrointestinal hemorrhage, unspecified: Secondary | ICD-10-CM | POA: Diagnosis not present

## 2020-05-07 DIAGNOSIS — K633 Ulcer of intestine: Secondary | ICD-10-CM | POA: Diagnosis not present

## 2020-05-07 DIAGNOSIS — N1831 Chronic kidney disease, stage 3a: Secondary | ICD-10-CM | POA: Diagnosis not present

## 2020-05-07 MED ORDER — MAGIC MOUTHWASH W/LIDOCAINE: 5.0000 mL | Freq: Four times a day (QID) | ORAL | 1 refills | Status: DC | PRN

## 2020-05-07 NOTE — Telephone Encounter (Signed)
Received call from pt's [place], Mickel Crow, RN  She states pt was discharged home on 05/05/20.  She states she has oral thrush and is asking for magic mouthwash. They are using [facility] in [place] as pt is staying with her daughter after coming out of the hospital.  She also states  Mrs. Kreutzer is feeling quite dizzy when she stands up and her BP drops 120/70 to 80/44 when she stands up. The nurse is asking to hld the norvasc. Advised that her PCP manages her BP and BP meds and that she should call pt's PCP.  Arbie Cookey also asking for  Appetite stimulant.  Advised that we can discuss that at her next visit with Dr. Lorenso Courier which is on 05/10/20.  Arbie Cookey and pt voiced understanding.  Magic Mouthwash called in to [facility] in [place].

## 2020-05-07 NOTE — Telephone Encounter (Signed)
LMOM for pt to CB to discuss recent hospital admission and to scheduled TCM hospital  follow up.

## 2020-05-07 NOTE — Telephone Encounter (Signed)
See note for medication refill

## 2020-05-07 NOTE — Telephone Encounter (Signed)
Patient daughter returning call about her mother about making an appt, I scheduled appt  On 8/31 with Dr.Kuneff for hospital

## 2020-05-08 NOTE — Telephone Encounter (Signed)
Spoke with patients daughter, [name].   Transition Care Management Follow-up Telephone Call  Admission: 04/30/2020-05/05/2020 Diagnosis: Rectal bleed/GI bleed   How have you been since you were released from the hospital? "She's doing much better". Denies blood in stool or abdominal pain.   Do you understand why you were in the hospital? yes   Do you understand the discharge instructions? yes   Where were you discharged to? Home. Resides with daughter.    Items Reviewed:  Medications reviewed: no  Allergies reviewed: yes  Dietary changes reviewed: yes  Referrals reviewed: yes, F/U with Oncology 05/10/2020. Calling GI to move f/u appt sooner.   Functional Questionnaire:   Activities of Daily Living (ADLs):   She states they are independent in the following: ambulation, bathing and hygiene, feeding, continence, grooming, toileting and dressing. Daughter is present for all ADLs. States they require assistance with the following: None.    Any transportation issues/concerns?: no   Any patient concerns? no   Confirmed importance and date/time of follow-up visits scheduled yes  Provider Appointment booked with PCP on 05/15/20.   Confirmed with patient if condition begins to worsen call PCP or go to the ER.  Patient was given the office number and encouraged to call back with question or concerns.  : yes

## 2020-05-09 ENCOUNTER — Other Ambulatory Visit: Payer: Self-pay | Admitting: Hematology and Oncology

## 2020-05-09 DIAGNOSIS — C8580 Other specified types of non-Hodgkin lymphoma, unspecified site: Secondary | ICD-10-CM

## 2020-05-10 ENCOUNTER — Inpatient Hospital Stay: Payer: Medicare Other

## 2020-05-10 ENCOUNTER — Inpatient Hospital Stay: Payer: Medicare Other | Admitting: Hematology and Oncology

## 2020-05-10 ENCOUNTER — Other Ambulatory Visit: Payer: Self-pay

## 2020-05-10 VITALS — BP 120/66 | HR 93 | Temp 98.0°F | Resp 16 | Ht 64.0 in | Wt 126.7 lb

## 2020-05-10 DIAGNOSIS — C8580 Other specified types of non-Hodgkin lymphoma, unspecified site: Secondary | ICD-10-CM | POA: Diagnosis not present

## 2020-05-10 DIAGNOSIS — D500 Iron deficiency anemia secondary to blood loss (chronic): Secondary | ICD-10-CM | POA: Diagnosis not present

## 2020-05-10 DIAGNOSIS — E876 Hypokalemia: Secondary | ICD-10-CM | POA: Diagnosis not present

## 2020-05-10 DIAGNOSIS — Z5111 Encounter for antineoplastic chemotherapy: Secondary | ICD-10-CM | POA: Diagnosis not present

## 2020-05-10 DIAGNOSIS — Z95828 Presence of other vascular implants and grafts: Secondary | ICD-10-CM

## 2020-05-10 DIAGNOSIS — C8514 Unspecified B-cell lymphoma, lymph nodes of axilla and upper limb: Secondary | ICD-10-CM | POA: Diagnosis not present

## 2020-05-10 DIAGNOSIS — Z5112 Encounter for antineoplastic immunotherapy: Secondary | ICD-10-CM | POA: Diagnosis not present

## 2020-05-10 LAB — LACTATE DEHYDROGENASE: LDH: 234 U/L — ABNORMAL HIGH (ref 98–192)

## 2020-05-10 LAB — CMP (CANCER CENTER ONLY)
ALT: 6 U/L (ref 0–44)
AST: 17 U/L (ref 15–41)
Albumin: 3.1 g/dL — ABNORMAL LOW (ref 3.5–5.0)
Alkaline Phosphatase: 65 U/L (ref 38–126)
Anion gap: 7 (ref 5–15)
BUN: 10 mg/dL (ref 8–23)
CO2: 23 mmol/L (ref 22–32)
Calcium: 9.4 mg/dL (ref 8.9–10.3)
Chloride: 103 mmol/L (ref 98–111)
Creatinine: 1.31 mg/dL — ABNORMAL HIGH (ref 0.44–1.00)
GFR, Est AFR Am: 45 mL/min — ABNORMAL LOW (ref >60)
GFR, Estimated: 39 mL/min — ABNORMAL LOW (ref >60)
Glucose, Bld: 108 mg/dL — ABNORMAL HIGH (ref 70–99)
Potassium: 3.2 mmol/L — ABNORMAL LOW (ref 3.5–5.1)
Sodium: 133 mmol/L — ABNORMAL LOW (ref 135–145)
Total Bilirubin: 0.5 mg/dL (ref 0.3–1.2)
Total Protein: 5.9 g/dL — ABNORMAL LOW (ref 6.5–8.1)

## 2020-05-10 LAB — CBC WITH DIFFERENTIAL (CANCER CENTER ONLY)
Abs Immature Granulocytes: 0.02 10*3/uL (ref 0.00–0.07)
Basophils Absolute: 0 10*3/uL (ref 0.0–0.1)
Basophils Relative: 2 %
Eosinophils Absolute: 0.1 10*3/uL (ref 0.0–0.5)
Eosinophils Relative: 8 %
HCT: 29.6 % — ABNORMAL LOW (ref 36.0–46.0)
Hemoglobin: 10.4 g/dL — ABNORMAL LOW (ref 12.0–15.0)
Immature Granulocytes: 2 %
Lymphocytes Relative: 35 %
Lymphs Abs: 0.5 10*3/uL — ABNORMAL LOW (ref 0.7–4.0)
MCH: 28.9 pg (ref 26.0–34.0)
MCHC: 35.1 g/dL (ref 30.0–36.0)
MCV: 82.2 fL (ref 80.0–100.0)
Monocytes Absolute: 0.3 10*3/uL (ref 0.1–1.0)
Monocytes Relative: 20 %
Neutro Abs: 0.4 10*3/uL — CL (ref 1.7–7.7)
Neutrophils Relative %: 33 %
Platelet Count: 126 10*3/uL — ABNORMAL LOW (ref 150–400)
RBC: 3.6 MIL/uL — ABNORMAL LOW (ref 3.87–5.11)
RDW: 17.4 % — ABNORMAL HIGH (ref 11.5–15.5)
WBC Count: 1.3 10*3/uL — ABNORMAL LOW (ref 4.0–10.5)
nRBC: 0 % (ref 0.0–0.2)

## 2020-05-10 LAB — IRON AND TIBC
Iron: 88 ug/dL (ref 41–142)
Saturation Ratios: 39 % (ref 21–57)
TIBC: 225 ug/dL — ABNORMAL LOW (ref 236–444)
UIBC: 137 ug/dL (ref 120–384)

## 2020-05-10 LAB — SAMPLE TO BLOOD BANK

## 2020-05-10 LAB — FERRITIN: Ferritin: 3799 ng/mL — ABNORMAL HIGH (ref 11–307)

## 2020-05-10 MED ORDER — POTASSIUM CHLORIDE IN NACL 20-0.9 MEQ/L-% IV SOLN
Freq: Once | INTRAVENOUS | Status: AC
  Filled 2020-05-10: qty 1000

## 2020-05-10 MED ORDER — SODIUM CHLORIDE 0.9% FLUSH
10.0000 mL | INTRAVENOUS | Status: DC | PRN
  Administered 2020-05-10: 10 mL
  Filled 2020-05-10: qty 10

## 2020-05-10 MED ORDER — HEPARIN SOD (PORK) LOCK FLUSH 100 UNIT/ML IV SOLN
500.0000 [IU] | Freq: Once | INTRAVENOUS | Status: AC | PRN
  Administered 2020-05-10: 500 [IU]
  Filled 2020-05-10: qty 5

## 2020-05-10 MED ORDER — SODIUM CHLORIDE 0.9 % IV SOLN: Freq: Once | INTRAVENOUS | Status: DC

## 2020-05-10 NOTE — Patient Instructions (Signed)
Hypokalemia Hypokalemia means that the amount of potassium in the blood is lower than normal. Potassium is a chemical (electrolyte) that helps regulate the amount of fluid in the body. It also stimulates muscle tightening (contraction) and helps nerves work properly. Normally, most of the body's potassium is inside cells, and only a very small amount is in the blood. Because the amount in the blood is so small, minor changes to potassium levels in the blood can be life-threatening. What are the causes? This condition may be caused by:  Antibiotic medicine.  Diarrhea or vomiting. Taking too much of a medicine that helps you have a bowel movement (laxative) can cause diarrhea and lead to hypokalemia.  Chronic kidney disease (CKD).  Medicines that help the body get rid of excess fluid (diuretics).  Eating disorders, such as bulimia.  Low magnesium levels in the body.  Sweating a lot. What are the signs or symptoms? Symptoms of this condition include:  Weakness.  Constipation.  Fatigue.  Muscle cramps.  Mental confusion.  Skipped heartbeats or irregular heartbeat (palpitations).  Tingling or numbness. How is this diagnosed? This condition is diagnosed with a blood test. How is this treated? This condition may be treated by:  Taking potassium supplements by mouth.  Adjusting the medicines that you take.  Eating more foods that contain a lot of potassium. If your potassium level is very low, you may need to get potassium through an IV and be monitored in the hospital. Follow these instructions at home:   Take over-the-counter and prescription medicines only as told by your health care provider. This includes vitamins and supplements.  Eat a healthy diet. A healthy diet includes fresh fruits and vegetables, whole grains, healthy fats, and lean proteins.  If instructed, eat more foods that contain a lot of potassium. This includes: ? Nuts, such as peanuts and  pistachios. ? Seeds, such as sunflower seeds and pumpkin seeds. ? Peas, lentils, and lima beans. ? Whole grain and bran cereals and breads. ? Fresh fruits and vegetables, such as apricots, avocado, bananas, cantaloupe, kiwi, oranges, tomatoes, asparagus, and potatoes. ? Orange juice. ? Tomato juice. ? Red meats. ? Yogurt.  Keep all follow-up visits as told by your health care provider. This is important. Contact a health care provider if you:  Have weakness that gets worse.  Feel your heart pounding or racing.  Vomit.  Have diarrhea.  Have diabetes (diabetes mellitus) and you have trouble keeping your blood sugar (glucose) in your target range. Get help right away if you:  Have chest pain.  Have shortness of breath.  Have vomiting or diarrhea that lasts for more than 2 days.  Faint. Summary  Hypokalemia means that the amount of potassium in the blood is lower than normal.  This condition is diagnosed with a blood test.  Hypokalemia may be treated by taking potassium supplements, adjusting the medicines that you take, or eating more foods that are high in potassium.  If your potassium level is very low, you may need to get potassium through an IV and be monitored in the hospital. This information is not intended to replace advice given to you by your health care provider. Make sure you discuss any questions you have with your health care provider. Document Revised: 04/14/2018 Document Reviewed: 04/14/2018 Elsevier Patient Education  2020 [employer].    Managing Low Blood Counts During Cancer Treatment Cancer treatments such as chemotherapy and radiation can sometimes cause a drop in the supply of blood cells  in the body, including red blood cells, white blood cells, and platelets. These blood cells are produced in the body and are released into the blood to perform specific functions:  Red blood cells carry gases such as oxygen and carbon dioxide to and from your  lungs.  White blood cells help protect you from infection.  Platelets help your body to form blood clots to prevent and control bleeding. When cancer treatments cause a drop in blood cell counts, your body may not have enough cells to keep up its normal functions. Symptoms or problems that may result will vary depending on which type of blood cells the treatment is affecting. If your blood counts are low, you can take steps to help manage any problems. How can low blood counts affect me? Low blood counts have various effects depending on the type of blood cells involved:  If you have a low number of red blood cells, you have a condition called anemia. This can cause symptoms such as: ? Feeling tired and weak. ? Feeling light-headed. ? Being short of breath.  If you have a low number of white blood cells, you may be at higher risk for infections.  If you have a low number of platelets, you may bleed more easily, or your body may have trouble stopping any bleeding. You may also have more bruising. How to manage symptoms or prevent problems from a low blood count If you have a low blood count, you can take steps to manage symptoms or prevent problems that may develop. The steps to take will depend on which type of blood cell is low. Low red blood cells Take these steps to help manage the symptoms of anemia:  Go for a walk or do some light exercise each day.  Take short naps during the day.  Eat foods that contain a lot of iron and protein. These include leafy vegetables, meat and fish, beans, sweet potatoes, and dried fruit such as prunes, raisins, and apricots.  Ask for help with errands and with work that needs to be done around the house. It is important to save your energy.  Take vitamins or supplements--such as iron, vitamin Y86, or folic acid--as told by your health care provider.  Practice relaxation techniques, such as yoga or meditation. Low white blood cells Take these steps  to help prevent infections:  Wash your hands often with warm, soapy water.  Avoid crowds of people and any person who has the flu or a fever.  Take care when cleaning yourself after using the bathroom. Tell your health care provider if you have any rectal sores or bleeding.  Avoid dental work. Check your mouth each day for sores or signs of infection.  Do not share utensils.  Avoid contact with pet waste. Wash your hands after handling pets.  If you get a scrape or cut, clean it thoroughly right away.  Avoid fresh plants or dried flowers.  Do not swim or wade in lakes, ponds, rivers, water parks, or hot tubs.  Follow food safety guidelines. Cook meat thoroughly and wash all raw fruits and vegetables.  You may be instructed to wear a mask when around others to protect yourself.  Low platelets Take these steps to help prevent or control bleeding and bruising:  Use an electric razor for shaving instead of a blade.  Use a soft toothbrush and be careful during oral care. Talk with your cancer care team about whether you should avoid flossing. If your mouth  is bleeding, rinse it with ice water.  Avoid activities that could cause injury, such as contact sports.  Talk with your health care provider about using laxatives or stool softeners to avoid constipation.  Do not use medicines such as ibuprofen, aspirin, or naproxen unless your health care provider tells you to.  Limit alcohol use.  Monitor any bleeding closely. If you start bleeding, hold pressure on the area for 5 minutes to stop the bleeding. Bleeding that does not stop is considered an emergency. What treatments can help increase a low blood count? If needed, your health care provider may recommend treatment for a low blood count. Treatment will depend on the type of blood cell that is low and the severity of your condition. Treatment options may include:  Taking medicines to help stimulate the growth of blood cells. This  is an option for treating a low red blood cell count. Your health care provider may also recommend that you take iron, folic acid, or vitamin B12 supplements.  Making dietary changes. Including more iron and protein in your diet can help stimulate the growth of red blood cells.  Adjusting your current medicines to help raise blood counts.  Making changes to your treatment plan.  Having a blood transfusion. This may be done if your blood count is very low. Contact a health care provider if:  You feel extremely tired and weak.  You have more bruising or bleeding.  You feel ill or you develop a cough.  You have swelling or redness.  You have mouth sores or a sore throat.  You have painful urination or you have blood in your urine or stool.  You are thinking of taking any new supplements or vitamins or making dietary changes. Get help right away if:  You are short of breath, have chest pain, or feel dizzy.  You have a fever or chills.  You have abdominal pain or diarrhea.  You have bleeding that will not stop. Summary  Cancer treatments such as chemotherapy and radiation can sometimes cause a drop in the supply of blood cells in the body, including red blood cells, white blood cells, and platelets.  If you have a low blood count, you can take steps to manage symptoms or prevent problems that may develop.  Depending on which type of blood cell is low, you may need to take steps to prevent infection, prevent bleeding, or manage symptoms that may develop.  If needed, your health care provider may recommend treatment for a low blood count. This information is not intended to replace advice given to you by your health care provider. Make sure you discuss any questions you have with your health care provider. Document Revised: 09/18/2016 Document Reviewed: 04/26/2016 Elsevier Patient Education  2020 [employer].

## 2020-05-11 ENCOUNTER — Telehealth: Payer: Self-pay | Admitting: Hematology and Oncology

## 2020-05-11 NOTE — Telephone Encounter (Signed)
Rescheduled infusion appointment, patient is notified.

## 2020-05-12 DIAGNOSIS — D709 Neutropenia, unspecified: Secondary | ICD-10-CM | POA: Diagnosis not present

## 2020-05-12 DIAGNOSIS — C8300 Small cell B-cell lymphoma, unspecified site: Secondary | ICD-10-CM | POA: Diagnosis not present

## 2020-05-12 DIAGNOSIS — N1831 Chronic kidney disease, stage 3a: Secondary | ICD-10-CM | POA: Diagnosis not present

## 2020-05-12 DIAGNOSIS — K633 Ulcer of intestine: Secondary | ICD-10-CM | POA: Diagnosis not present

## 2020-05-12 DIAGNOSIS — K648 Other hemorrhoids: Secondary | ICD-10-CM | POA: Diagnosis not present

## 2020-05-12 DIAGNOSIS — K922 Gastrointestinal hemorrhage, unspecified: Secondary | ICD-10-CM | POA: Diagnosis not present

## 2020-05-12 DIAGNOSIS — D631 Anemia in chronic kidney disease: Secondary | ICD-10-CM | POA: Diagnosis not present

## 2020-05-12 DIAGNOSIS — E1122 Type 2 diabetes mellitus with diabetic chronic kidney disease: Secondary | ICD-10-CM | POA: Diagnosis not present

## 2020-05-12 DIAGNOSIS — Z452 Encounter for adjustment and management of vascular access device: Secondary | ICD-10-CM | POA: Diagnosis not present

## 2020-05-12 DIAGNOSIS — D62 Acute posthemorrhagic anemia: Secondary | ICD-10-CM | POA: Diagnosis not present

## 2020-05-12 DIAGNOSIS — D61818 Other pancytopenia: Secondary | ICD-10-CM | POA: Diagnosis not present

## 2020-05-12 DIAGNOSIS — K5730 Diverticulosis of large intestine without perforation or abscess without bleeding: Secondary | ICD-10-CM | POA: Diagnosis not present

## 2020-05-12 DIAGNOSIS — I129 Hypertensive chronic kidney disease with stage 1 through stage 4 chronic kidney disease, or unspecified chronic kidney disease: Secondary | ICD-10-CM | POA: Diagnosis not present

## 2020-05-13 ENCOUNTER — Encounter: Payer: Self-pay | Admitting: Hematology and Oncology

## 2020-05-13 NOTE — Progress Notes (Signed)
[place] [facility] [phone]   [phone]) [phone]  PROGRESS NOTE  Patient Care Team: Ma Hillock, DO as PCP - General (Family Medicine) Danie Binder, MD (Inactive) as Consulting Physician (Gastroenterology) Gala Romney Cristopher Estimable, MD as Consulting Physician (Gastroenterology) Minus Breeding, MD as Consulting Physician (Cardiology) Annitta Needs, NP (Gastroenterology) Carlis Stable, NP as Nurse Practitioner (Gastroenterology)  Hematological/Oncological History  # Low Grade Marginal Zone Lymphoma, Stage III 1) 06/06/2019: patient underwent a diagnostic mammogram of the left breast. Calcifications noted in the left breast, recommended 6 month f/u imaging. 2) 02/02/2020: bilateral diagnostic mammogram showedabnormal enlarged lymph nodes with cortical thickening. The largest of these measures 2.2 x 1.7 x 2.0 cm. 3) 02/02/2020: US guided biopsy of left axillary lymph nodes performed. 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 4) 02/16/2020: establish care with Dr. Lorenso Courier 5) 02/29/2020: PET CT scan performed, showed hypermetabolic lymph nodes identified in the posterior left neck, both axillary/subpectoral regions, mediastinum, hila, right external iliac chain, and right groin. Massive splenomegaly of 17.8 cm noted as well.  6) 03/20/2020: excisional biopsy of left posterior cervical lymph node. Biopsy confirmed most likely low grade marginal zone lymphoma.   7) 7/14-7/15/2021: Cycle 1 Day 1 of Rituximab/Bendamycin 8) 03/30/2020-04/01/2020: admitted inpatient for hematemesis. Noted to have marked esophagitis on EGD.  9) 04/25/2020: IV feraheme 510mg  administered 10) 04/26/2020: Cycle 2 Day 1 of Rituximab/Bendamycin 11) 05/10/2020: HOLD R-Benda in setting of colitis, GI bleed, and  intolerance to therapy. Plan to start monotherapy Rituximab.   Interval History:  Elizabeth Mcintyre 79 y.o. 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 The patient's last visit was on 04/26/2020 for Day 1 Cycle 2 of BR chemotherapy. In the interim she was admitted to [facility] with bloody stool and was found to have colitis.   On exam today Elizabeth Mcintyre she is improving from her hospitalization.  She notes that her blood pressure has been low and as a result she stopped her blood pressure medication.  She notes that she has had quite a bit of weakness and she has been sleeping more than usual.  She is currently taking her antibiotics of Cipro and Flagyl as scheduled and intends to complete the full 14-day treatment.  She notes that she does still have some stomach discomfort but she has been passing gas and is feeling better every day.  She notes that she is not having any issues with shortness of breath, fevers, chills, sweats, nausea, vomiting or diarrhea. A full 10 point ROS is listed below.  The bulk of our discussion today focused on the results of her CT scans during her hospitalization, the complication she is had from chemotherapy, and the treatment plan moving forward.  The details of this discussion are elaborated below.  MEDICAL HISTORY:  Past Medical History:  Diagnosis Date  . Asthma   . Bloating 04/26/2019  . Cancer ([place]) 2021   Lymphoma  . 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 DIABETES MELLITUS, TYPE II 11/09/2007   diet control  . Diverticulosis 03/2011  . GERD (gastroesophageal reflux disease)    none recently  . GI bleed   . History of rheumatoid arthritis    during 30's, was treated.  Marland Kitchen  Hyperlipidemia   . Hypertension   . IBS (irritable bowel syndrome)   . Osteopenia 2017   Last  bone density 05/04/2017: -2.4  . PONV (postoperative nausea and  vomiting)   . Stress incontinence   . Stroke [facility])    mini stroke - found on a CT scan    SURGICAL HISTORY: Past Surgical History:  Procedure Laterality Date  . ABDOMINAL HYSTERECTOMY    . BIOPSY  03/30/2020   Procedure: BIOPSY;  Surgeon: Ronald Lobo, MD;  Location: [facility];  Service: Endoscopy;;  . BIOPSY  05/02/2020   Procedure: BIOPSY;  Surgeon: Montez Morita, Quillian Quince, MD;  Location: [facility];  Service: Gastroenterology;;  . CARDIAC CATHETERIZATION     X 2, last one in 1998  . CHOLECYSTECTOMY N/A 04/13/2017   Procedure: LAPAROSCOPIC CHOLECYSTECTOMY;  Surgeon: Aviva Signs, MD;  Location: [facility];  Service: General;  Laterality: N/A;  . COLONOSCOPY    . COLONOSCOPY  May 2012   Dr. Olevia Perches: mild diverticulosis, otherwise normal.   . COLONOSCOPY WITH PROPOFOL N/A 05/02/2020   Procedure: COLONOSCOPY WITH PROPOFOL;  Surgeon: Harvel Quale, MD;  Location: [facility];  Service: Gastroenterology;  Laterality: N/A;  . ESOPHAGOGASTRODUODENOSCOPY N/A 01/28/2015   Dr. Gala Romney: reflux esophagitis, Schatzki's ring not manipulated due to recent bleeding  . ESOPHAGOGASTRODUODENOSCOPY N/A 03/30/2015   Dr. Gala Romney: Schatzki's ring s/p Venia Minks dilation, previously noted esophageal ulcer completely healed  . ESOPHAGOGASTRODUODENOSCOPY N/A 03/30/2020   Procedure: ESOPHAGOGASTRODUODENOSCOPY (EGD);  Surgeon: Ronald Lobo, MD;  Location: [facility];  Service: Endoscopy;  Laterality: N/A;  . IR IMAGING GUIDED PORT INSERTION  03/13/2020  . LYMPH NODE BIOPSY Left 03/20/2020   Procedure: LEFT POSTERIOR CERVICAL LYMPH NODE BIOPSY;  Surgeon: Georganna Skeans, MD;  Location: [place];  Service: General;  Laterality: Left;  Marland Kitchen MALONEY DILATION N/A 03/30/2015   Procedure: Venia Minks DILATION;  Surgeon: Daneil Dolin, MD;  [facility];  Service: Endoscopy;  Laterality: N/A;  . POLYPECTOMY  05/02/2020   Procedure: POLYPECTOMY;  Surgeon: Harvel Quale, MD;  [facility];  Service: Gastroenterology;;    SOCIAL HISTORY: Social History   Socioeconomic History  . Marital status: Widowed    Spouse name: Not on file  . Number of children: 2  . Years of education: Not on file  . Highest education level: Not on file  Occupational History  . Occupation: retired  Tobacco Use  . Smoking status: Never Smoker  . Smokeless tobacco: Never Used  Vaping Use  . Vaping Use: Never used  Substance and Sexual Activity  . Alcohol use: No  . Drug use: No  . Sexual activity: Never  Other Topics Concern  . Not on file  Social History Narrative   Ms. Prisk is widowed. Her young grandson lives with her, for whom she shares custody with her daughter, the son's aunt.     Social Determinants of Health   Financial Resource Strain:   . Difficulty of Paying Living Expenses: Not on file  Food Insecurity:   . Worried About Charity fundraiser in the Last Year: Not on file  . Ran Out of Food in the Last Year: Not on file  Transportation Needs:   . Lack of Transportation (Medical): Not on file  . Lack of Transportation (Non-Medical): Not on file  Physical Activity:   . Days of Exercise per Week: Not on file  . Minutes of Exercise per Session: Not on file  Stress:   .  Feeling of Stress : Not on file  Social Connections:   . Frequency of Communication with Friends and Family: Not on file  . Frequency of Social Gatherings with Friends and Family: Not on file  . Attends Religious Services: Not on file  . Active Member of Clubs or Organizations: Not on file  . Attends Archivist Meetings: Not on file  . Marital Status: Not on file  Intimate Partner Violence:   . Fear of Current or Ex-Partner: Not on file  . Emotionally Abused: Not on file  . Physically Abused: Not on file  . Sexually Abused: Not on file    FAMILY HISTORY: Family History  Problem Relation Age of Onset  . Heart disease Mother   . Osteoarthritis Mother   . Sudden death Father   .  Single kidney Father   . Other Father        h/o severe MVA injuries  . Hyperlipidemia Sister   . Other Daughter        Myalgias  . Fibromyalgia Daughter   . Allergies Daughter   . Heart disease Maternal Grandfather   . Sudden death Paternal Grandmother   . Diabetes Paternal Grandfather   . Heart disease Daughter   . Other Daughter        palpitations  . Pulmonary fibrosis Maternal Aunt   . Cancer Paternal Uncle   . Pulmonary fibrosis Maternal Aunt   . Colon cancer Neg Hx     ALLERGIES:  is allergic to codeine phosphate.  MEDICATIONS:  Current Outpatient Medications  Medication Sig Dispense Refill  . ciprofloxacin (CIPRO) 500 MG tablet Take 1 tablet (500 mg total) by mouth 2 (two) times daily. 14 tablet 0  . acetaminophen (TYLENOL) 325 MG tablet Take 650 mg by mouth every 6 (six) hours as needed for moderate pain.     Marland Kitchen albuterol (VENTOLIN HFA) 108 (90 Base) MCG/ACT inhaler Inhale 2 puffs into the lungs every 6 (six) hours as needed for wheezing. 8.5 g 2  . allopurinol (ZYLOPRIM) 300 MG tablet Take 1 tablet (300 mg total) by mouth daily. 30 tablet 3  . amLODipine (NORVASC) 10 MG tablet Take 1 tablet (10 mg total) by mouth daily. (Patient not taking: Reported on 05/10/2020) 90 tablet 1  . Cholecalciferol (VITAMIN D3) 25 MCG (1000 UT) CAPS Take 1,000 Units by mouth daily.     Marland Kitchen dicyclomine (BENTYL) 10 MG capsule Take 1 capsule (10 mg total) by mouth 4 (four) times daily -  before meals and at bedtime. Needs appt for further refills. (Patient taking differently: Take 10 mg by mouth 3 (three) times daily as needed for spasms. ) 360 capsule 0  . levothyroxine (SYNTHROID) 75 MCG tablet Take 1 tablet (75 mcg total) by mouth daily. 90 tablet 3  . lidocaine-prilocaine (EMLA) cream Apply 1 application topically as needed. 30 g 1  . loperamide (IMODIUM) 2 MG capsule Take 1 capsule (2 mg total) by mouth every 6 (six) hours as needed for diarrhea or loose stools. 30 capsule 0  . magic mouthwash  w/lidocaine SOLN Take 5 mLs by mouth 4 (four) times daily as needed for mouth pain. 260 mL 1  . metroNIDAZOLE (FLAGYL) 250 MG tablet Take 1 tablet (250 mg total) by mouth 3 (three) times daily after meals. 21 tablet 0  . nystatin (MYCOSTATIN) 100000 UNIT/ML suspension Take 5 mLs by mouth 4 (four) times daily.    . ondansetron (ZOFRAN) 8 MG tablet Take 1 tablet (8 mg  total) by mouth every 8 (eight) hours as needed for nausea or vomiting. 30 tablet 1  . pantoprazole (PROTONIX) 40 MG tablet Take 1 tablet (40 mg total) by mouth 2 (two) times daily. After 8 weeks take only once daily. 60 tablet 1  . potassium chloride SA (KLOR-CON) 20 MEQ tablet Take 1 tablet (20 mEq total) by mouth daily. 14 tablet 0  . prochlorperazine (COMPAZINE) 10 MG tablet Take 1 tablet (10 mg total) by mouth every 6 (six) hours as needed for nausea or vomiting. 30 tablet 1  . rOPINIRole (REQUIP) 0.25 MG tablet Take 1 tablet (0.25 mg total) by mouth at bedtime. 90 tablet 1  . sucralfate (CARAFATE) 1 GM/10ML suspension Take 10 mLs (1 g total) by mouth 4 (four) times daily as needed (for throat/mouth burning). (Patient not taking: Reported on 05/10/2020) 420 mL 1  . vitamin B-12 (CYANOCOBALAMIN) 1000 MCG tablet Take 1,000 mcg by mouth daily.     No current facility-administered medications for this visit.    REVIEW OF SYSTEMS:   Constitutional: ( - ) fevers, ( - )  chills , ( - ) night sweats Eyes: ( - ) blurriness of vision, ( - ) double vision, ( - ) watery eyes Ears, nose, mouth, throat, and face: ( - ) mucositis, ( - ) sore throat Respiratory: ( - ) cough, ( - ) dyspnea, ( - ) wheezes Cardiovascular: ( - ) palpitation, ( - ) chest discomfort, ( - ) lower extremity swelling Gastrointestinal:  ( - ) nausea, ( - ) heartburn, ( - ) change in bowel habits Skin: ( - ) abnormal skin rashes Lymphatics: ( - ) new lymphadenopathy, ( - ) easy bruising Neurological: ( - ) numbness, ( - ) tingling, ( - ) new weaknesses Behavioral/Psych:  ( - ) mood change, ( - ) new changes  All other systems were reviewed with the patient and are negative.  PHYSICAL EXAMINATION: ECOG PERFORMANCE STATUS: 1 - Symptomatic but completely ambulatory  Vitals:   05/10/20 1203  BP: 120/66  Pulse: 93  Resp: 16  Temp: 98 F (36.7 C)  SpO2: 100%   Filed Weights   05/10/20 1203  Weight: 126 lb 11.2 oz (57.5 kg)    GENERAL: well appearing elderly Caucasian female in NAD  SKIN: skin color, texture, turgor are normal. Small dime sized dry skin lesions on chest, about 5 in number.  EYES: conjunctiva are pink and non-injected, sclera clear NECK: supple, non-tender. Healing incision on left neck at site of excisional biopsy.  LUNGS: clear to auscultation and percussion with normal breathing effort HEART: regular rate & rhythm and no murmurs and no lower extremity edema Musculoskeletal: no cyanosis of digits and no clubbing  PSYCH: alert & oriented x 3, fluent speech NEURO: no focal motor/sensory deficits  LABORATORY DATA:  I have reviewed the data as listed CBC Latest Ref Rng & Units 05/10/2020 05/04/2020 05/03/2020  WBC 4.0 - 10.5 K/uL 1.3(L) 1.2(LL) 1.0(LL)  Hemoglobin 12.0 - 15.0 g/dL 10.4(L) 9.1(L) 7.4(L)  Hematocrit 36 - 46 % 29.6(L) 26.4(L) 21.7(L)  Platelets 150 - 400 K/uL 126(L) 86(L) 90(L)    CMP Latest Ref Rng & Units 05/10/2020 05/05/2020 05/04/2020  Glucose 70 - 99 mg/dL 108(H) 102(H) 117(H)  BUN 8 - 23 mg/dL 10 6(L) 5(L)  Creatinine 0.44 - 1.00 mg/dL 1.31(H) 0.94 0.91  Sodium 135 - 145 mmol/L 133(L) 133(L) 134(L)  Potassium 3.5 - 5.1 mmol/L 3.2(L) 3.7 2.8(L)  Chloride 98 - 111 mmol/L  103 106 106  CO2 22 - 32 mmol/L 23 20(L) 20(L)  Calcium 8.9 - 10.3 mg/dL 9.4 7.9(L) 7.7(L)  Total Protein 6.5 - 8.1 g/dL 5.9(L) - -  Total Bilirubin 0.3 - 1.2 mg/dL 0.5 - -  Alkaline Phos 38 - 126 U/L 65 - -  AST 15 - 41 U/L 17 - -  ALT 0 - 44 U/L 6 - -    RADIOGRAPHIC STUDIES: I have personally reviewed the radiological images as listed and  agreed with the findings in the report: FDG avid spleen and axillary lymph nodes. Involvement of cervical and inguinal nodes as well.  CT Abdomen Pelvis Wo Contrast  Result Date: 04/30/2020 CLINICAL DATA:  Rectal bleeding and bloody stool. EXAM: CT ABDOMEN AND PELVIS WITHOUT CONTRAST TECHNIQUE: Multidetector CT imaging of the abdomen and pelvis was performed following the standard protocol without IV contrast. COMPARISON:  March 31, 2019 FINDINGS: Lower chest: No acute abnormality. Hepatobiliary: No focal liver abnormality is seen. Status post cholecystectomy. No biliary dilatation. Pancreas: Unremarkable. No pancreatic ductal dilatation or surrounding inflammatory changes. Spleen: Normal in size without focal abnormality. Adrenals/Urinary Tract: Adrenal glands are unremarkable. Kidneys are normal in size, without renal calculi or hydronephrosis. A 1.5 cm diameter exophytic cyst is seen along the lateral aspect of the mid right kidney. Bladder is unremarkable. Stomach/Bowel: Stomach is within normal limits. Appendix appears normal. A small amount of retained barium is seen within the distal transverse colon. No evidence of bowel dilatation. Marked severity thickening of the mid sigmoid colon is seen. A mild amount of pericolonic inflammatory fat stranding is also noted. Vascular/Lymphatic: There is marked severity calcification of the abdominal aorta and bilateral common iliac arteries, without evidence of aneurysmal dilatation. No enlarged abdominal or pelvic lymph nodes. Reproductive: Status post hysterectomy. No adnexal masses. Other: No abdominal wall hernia or abnormality. No abdominopelvic ascites. Musculoskeletal: No acute or significant osseous findings. IMPRESSION: 1. Marked severity thickening of the mid sigmoid colon with a mild amount of pericolonic inflammatory fat stranding. While this may represent sequelae associated with an acute infectious or inflammatory colitis, the presence of an underlying  neoplastic process cannot be excluded. Correlation with colonoscopy is recommended. 2. Evidence of prior cholecystectomy and hysterectomy. 3. Aortic atherosclerosis. Aortic Atherosclerosis (ICD10-I70.0). Electronically Signed   By: Virgina Norfolk M.D.   On: 04/30/2020 17:24   DG ESOPHAGUS W DOUBLE CM (HD)  Result Date: 04/23/2020 CLINICAL DATA:  Dysphagia. Reflux. History of stroke in [place]. History of prior esophageal dilation in 2016. EXAM: ESOPHOGRAM / BARIUM SWALLOW / BARIUM TABLET STUDY TECHNIQUE: Combined double contrast and single contrast examination performed using effervescent crystals, thick barium liquid, and thin barium liquid. The patient was observed with fluoroscopy swallowing a 13 mm barium sulphate tablet. FLUOROSCOPY TIME:  Fluoroscopy Time:  1 minutes, 54 seconds Radiation Exposure Index (if provided by the fluoroscopic device): 13.4 mGy Number of Acquired Spot Images: 0 COMPARISON:  CT chest 03/30/2020 FINDINGS: Normal pharyngeal phase of swallowing. Primary peristaltic waves in the esophagus were normal on 03/04 swallows. Small type 1 hiatal hernia as shown on image 21/4. There is a widely patent distal esophageal mucosal ring shown on image 20/4. Just above this, in the distal esophagus, there is a smoothly marginated focal narrowing/stricture measuring over about 0.4 cm in length. I was only able to distend this up to about 1.0 cm in diameter, but on the other hand a 13 mm barium tablet was able to pass through this region without difficulty, and pass into  the stomach. There is no significant irregularity along this narrowing. Right-sided Port-A-Cath noted.  Atherosclerotic aortic arch. IMPRESSION: 1. Small type 1 hiatal hernia. 2. Smoothly marginated focal narrowing/stricture in the distal esophagus just above the level of the Schatzki ring, only able to distend up to about 1.0 cm in diameter. However, a 13 mm barium tablet passed through this narrowing without impaction. 3. Widely  patent distal esophageal mucosal ring. 4. Right-sided Port-A-Cath noted. 5.  Aortic Atherosclerosis (ICD10-I70.0). Electronically Signed   By: Van Clines M.D.   On: 04/23/2020 09:56   CT Angio Abd/Pel w/ and/or w/o  Result Date: 05/03/2020 CLINICAL DATA:  Mesenteric ischemia, rectal bleeding, abdominal pain EXAM: CTA ABDOMEN AND PELVIS WITH CONTRAST TECHNIQUE: Multidetector CT imaging of the abdomen and pelvis was performed using the standard protocol during bolus administration of intravenous contrast. Multiplanar reconstructed images and MIPs were obtained and reviewed to evaluate the vascular anatomy. CONTRAST:  128mL OMNIPAQUE IOHEXOL 350 MG/ML SOLN well-tolerated COMPARISON:  Noncontrast CT from earlier the same day, and earlier studies FINDINGS: VASCULAR Aorta: Extensive calcified atheromatous plaque particularly in the infrarenal segment. No aneurysm, dissection, or stenosis. Celiac: Calcified ostial plaque resulting in only mild stenosis, patent distally SMA: Calcified ostial plaque resulting in only mild stenosis, atheromatous but patent distally. Renals: Duplicated left, superior dominant with heavily calcified ostial plaque resulting in short segment stenosis of at least mild severity. Single right, with heavily calcified ostial plaque root extending over length of approximately 1.6 cm resulting in at least moderate stenosis, patent distally. IMA: Patent Inflow: Moderate calcified plaque involving bilateral common and internal iliac arteries, without high-grade stenosis. Proximal Outflow: Atheromatous, patent. Veins: Patent hepatic veins, portal vein, SMV, splenic vein, bilateral renal veins. Iliac venous system and IVC unremarkable. No venous pathology identified. Review of the MIP images confirms the above findings. NON-VASCULAR Lower chest: Tip of central venous catheter noted at the cavoatrial junction. Coronary calcifications. No pleural or pericardial effusion. Linear subpleural scarring  or atelectasis posteriorly in the lung bases. Hepatobiliary: No focal liver abnormality is seen. Status post cholecystectomy. No biliary dilatation. Pancreas: Unremarkable. No pancreatic ductal dilatation or surrounding inflammatory changes. Spleen: Normal in size without focal abnormality. Adrenals/Urinary Tract: Adrenal glands unremarkable. 1.5 cm exophytic cyst from the mid right kidney since 04/10/2011. No hydronephrosis. Urinary bladder physiologically distended. Stomach/Bowel: Stomach is nondistended. Small bowel decompressed. Appendix not discretely identified. The colon is nondilated. There is circumferential wall thickening and mild enhancement in the mid sigmoid colon. Lymphatic: No mesenteric, abdominal or pelvic adenopathy. Reproductive: Status post hysterectomy. No adnexal masses. Other: Slight increase in small volume pelvic ascites.  No free air. Musculoskeletal: No acute or significant osseous findings. IMPRESSION: 1. No significant proximal mesenteric arterial occlusive disease to suggest etiology of mesenteric ischemia. 2. Circumferential wall thickening and mild enhancement in the mid sigmoid colon suggesting colitis. Consider colonoscopic follow-up to exclude mucosal lesion. 3. Bilateral renal artery ostial stenoses. 4. Coronary artery disease. Aortic Atherosclerosis (ICD10-I70.0). Electronically Signed   By: Lucrezia Europe M.D.   On: 05/03/2020 16:43    ASSESSMENT & PLAN Elizabeth Mcintyre 79 y.o. 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  On exam today Ms. Lawler is still covering from her hospitalization for colitis.  She was able to receive one of her IV iron treatments, but unfortunately was not able to receive the second.  Her counts today show that she is still mildly neutropenic, but otherwise  her counts are rebounding nicely.  She is not having any blood in her stool or abdominal pain/diarrhea.  After  discussion today the patient the daughter and myself were in agreement that bendamustine rituximab therapy was too toxic to continue in this patient.  As such I would recommend that we proceed with rituximab monotherapy.  As a silver lining we have noted that her scans during her prior admission showed normalization of her spleen as well as resolution of her intra-abdominal lymphadenopathy.  For her current diagonsis I recommend a rituximab monotherapy given her intolerance of BR x 2 cycles. This regimen would consist of rituximab 375 mg/m2 IV q weekly x 4 weeks. During her scans on 8/16 - 05/03/2020 she was shown to have marked response to therapy with normalization of spleen size and resolution of lymphadenopathy.   GELF Criteria: 1 (splenomegaly). Indication for treatment  # Low Grade Marginal Zone Lymphoma, Stage III --plan to HOLD bendamustine + rituximab as patient is unable to tolerate this treatment. Will pursue rituximab monotherapy instead. Started therapy with Cycle 1 Day 1 on 03/28/2020 BR, plan to decrease to Ritux alone q weekly x 4 weeks starting in approximately 2-3 weeks.  --patient completed excisional lymph node biopsy to confirm the diagnosis. Pathological exam of the lymph node confirms marginal zone lymphoma.  --PET CT scan confirms stage III disease. Patient meets GELF criteria for treatment (splenomegaly). She did not have any B symptoms and her counts were stable at diagnosis, with some mild thrombocytopenia.  --patient completed port placement and chemotherapy education. --RTC for 2 week for interval visit and 3 weeks for resuming treatment.   #Concern for GI Bleed #Hematemesis, resolved #Colitis, resolving  --patient admitted on 03/30/2020 (day after Cycle 1 chemotherapy) with hematemesis --patient admitted on  --no overt GI bleeding noted on Upper GI evaluation --Hgb rebounding, patient back to baseline --recommend weekly labs between now and next treatment visit.  Continue to monitor Hgb.  --iron levels appear low and patient is having symptoms of iron deficiency anemia including fatigue, restless leg, and dyspnea on exertion.  --scheduled patient for IV feraheme this week.   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   No orders of the defined types were placed in this encounter.   All questions were answered. The patient knows to call the clinic with any problems, questions or concerns.  A total of more than 30 minutes were spent on this encounter and over half of that time was spent on counseling and coordination of care as outlined above.   Ledell Peoples, MD Department of Hematology/Oncology [place] at [facility] Phone: [phone] Pager: [phone] Email: [email or web address]  05/13/2020 2:48 PM   Literature Support:  Artist Pais der Angeline Slim BS, Lyna Poser, Hertzberg M, Kwan YL, Sporrer D, Craig M, [place], [place], [place], [place] DM, Munteanu M, Aram Beecham JM. 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 Blood. 2014 May 8;123(19):2944-52.  --The overall response rates for BR and R-CHOP/R-CVP were 97% and 91%, respectively (P = .0102). These data indicate BR is noninferior to standard therapy with regard to clinical response with an acceptable safety profile.

## 2020-05-14 ENCOUNTER — Telehealth: Payer: Self-pay | Admitting: *Deleted

## 2020-05-14 NOTE — Telephone Encounter (Signed)
Received vm message from pt's daughter, [name].  She states Derica is still feeling quite weak, fatigued, sleeping a lot.  She is drinking a good amount of fluid including protein drinks (1-2 per day), gatorade light and water. She is eating small amounts of food. [name] is wondering if she should stop her thyroid medication. Her BP meds have been stopped already and her blood sugar meds.  Advised Wendy NOT to stop lthe thyroid medication, that stopping it could make it worse. Advised that we would check her Thyroid levels next visit and adjust accordingly. She is taking her KCL-she needs it in applesauce so she can swallow it.  Asked [name] if she felt that her mom needed labs and/or needed to be seen again this week. She is scheduled for labs and MD visit on 05/23/20. [name] states that if her mom gets worse this week she would want her her  Seen but if she maintains , even at this low level, or gets better , she is ok to wait until next week.  Advised that she can call anytime with any questions or concerns. [name] voiced understanding.

## 2020-05-15 ENCOUNTER — Inpatient Hospital Stay: Payer: Medicare Other | Admitting: Family Medicine

## 2020-05-15 DIAGNOSIS — K633 Ulcer of intestine: Secondary | ICD-10-CM | POA: Diagnosis not present

## 2020-05-15 DIAGNOSIS — C8300 Small cell B-cell lymphoma, unspecified site: Secondary | ICD-10-CM | POA: Diagnosis not present

## 2020-05-15 DIAGNOSIS — Z452 Encounter for adjustment and management of vascular access device: Secondary | ICD-10-CM | POA: Diagnosis not present

## 2020-05-15 DIAGNOSIS — K648 Other hemorrhoids: Secondary | ICD-10-CM | POA: Diagnosis not present

## 2020-05-15 DIAGNOSIS — I129 Hypertensive chronic kidney disease with stage 1 through stage 4 chronic kidney disease, or unspecified chronic kidney disease: Secondary | ICD-10-CM | POA: Diagnosis not present

## 2020-05-15 DIAGNOSIS — E1122 Type 2 diabetes mellitus with diabetic chronic kidney disease: Secondary | ICD-10-CM | POA: Diagnosis not present

## 2020-05-15 DIAGNOSIS — D62 Acute posthemorrhagic anemia: Secondary | ICD-10-CM | POA: Diagnosis not present

## 2020-05-15 DIAGNOSIS — K5730 Diverticulosis of large intestine without perforation or abscess without bleeding: Secondary | ICD-10-CM | POA: Diagnosis not present

## 2020-05-15 DIAGNOSIS — D709 Neutropenia, unspecified: Secondary | ICD-10-CM | POA: Diagnosis not present

## 2020-05-15 DIAGNOSIS — N1831 Chronic kidney disease, stage 3a: Secondary | ICD-10-CM | POA: Diagnosis not present

## 2020-05-15 DIAGNOSIS — K922 Gastrointestinal hemorrhage, unspecified: Secondary | ICD-10-CM | POA: Diagnosis not present

## 2020-05-15 DIAGNOSIS — D631 Anemia in chronic kidney disease: Secondary | ICD-10-CM | POA: Diagnosis not present

## 2020-05-15 DIAGNOSIS — D61818 Other pancytopenia: Secondary | ICD-10-CM | POA: Diagnosis not present

## 2020-05-17 ENCOUNTER — Other Ambulatory Visit: Payer: Self-pay | Admitting: *Deleted

## 2020-05-17 MED ORDER — POTASSIUM CHLORIDE CRYS ER 20 MEQ PO TBCR: 20.0000 meq | EXTENDED_RELEASE_TABLET | Freq: Every day | ORAL | 0 refills | Status: DC

## 2020-05-22 ENCOUNTER — Other Ambulatory Visit: Payer: Self-pay | Admitting: Hematology and Oncology

## 2020-05-22 DIAGNOSIS — C8580 Other specified types of non-Hodgkin lymphoma, unspecified site: Secondary | ICD-10-CM

## 2020-05-23 ENCOUNTER — Ambulatory Visit: Payer: Medicare Other

## 2020-05-23 ENCOUNTER — Inpatient Hospital Stay: Payer: Medicare Other

## 2020-05-23 ENCOUNTER — Telehealth: Payer: Self-pay | Admitting: *Deleted

## 2020-05-23 ENCOUNTER — Telehealth: Payer: Self-pay

## 2020-05-23 ENCOUNTER — Inpatient Hospital Stay: Payer: Medicare Other | Admitting: Hematology and Oncology

## 2020-05-23 NOTE — Telephone Encounter (Signed)
Home health orders received 05/23/20 for [place] health initiation orders: Yes.  Home health re-certification orders: No. Patient last seen by ordering physician for this condition:N/A . Must be less than 90 days for re-certification and less than 30 days prior for initiation. Visit must have been for the condition the orders are being placed.  Patient meets criteria for Physician to sign orders: Yes.        Current med list has been attached: Yes        Orders placed on physicians desk for signature: 05/23/20 If patient does not meet criteria for orders to be signed: pt was called to schedule appt. Appt is scheduled for N/A.   Elizabeth Mcintyre  Pt/pt daughter was previously told that she would not have to come into office for this order. Pt was recently in contact with COVID and is planning to get tested Sunday or Monday. Please advise.

## 2020-05-23 NOTE — Telephone Encounter (Signed)
Pt's daughter called this morning. She and her mother were exposed to covid yesterday and she is canceling her  Mother's appt for today.  Dr. Lorenso Courier made aware.   Will call pt later today to inquire about her status.

## 2020-05-24 ENCOUNTER — Ambulatory Visit: Payer: Medicare Other

## 2020-05-25 ENCOUNTER — Telehealth: Payer: Self-pay | Admitting: Hematology and Oncology

## 2020-05-25 NOTE — Telephone Encounter (Signed)
Scheduled appt per 9/11 sch msg - pt daughter is aware of appt

## 2020-05-28 ENCOUNTER — Other Ambulatory Visit: Payer: Self-pay | Admitting: Critical Care Medicine

## 2020-05-28 ENCOUNTER — Other Ambulatory Visit: Payer: Self-pay

## 2020-05-28 ENCOUNTER — Other Ambulatory Visit: Payer: Medicare Other

## 2020-05-28 DIAGNOSIS — Z20822 Contact with and (suspected) exposure to covid-19: Secondary | ICD-10-CM | POA: Diagnosis not present

## 2020-05-28 NOTE — Telephone Encounter (Signed)
Orders faxed and sent to scan

## 2020-05-28 NOTE — Telephone Encounter (Addendum)
This was a Chief Strategy Officer order.  She does not need an appt Yet.  If still needing Home health after 3 months of the first order> placed end of July> then she will need an appt.   Please fax and scan orders.

## 2020-05-29 DIAGNOSIS — K5730 Diverticulosis of large intestine without perforation or abscess without bleeding: Secondary | ICD-10-CM | POA: Diagnosis not present

## 2020-05-29 DIAGNOSIS — C8300 Small cell B-cell lymphoma, unspecified site: Secondary | ICD-10-CM | POA: Diagnosis not present

## 2020-05-29 DIAGNOSIS — N1831 Chronic kidney disease, stage 3a: Secondary | ICD-10-CM | POA: Diagnosis not present

## 2020-05-29 DIAGNOSIS — D709 Neutropenia, unspecified: Secondary | ICD-10-CM | POA: Diagnosis not present

## 2020-05-29 DIAGNOSIS — E1122 Type 2 diabetes mellitus with diabetic chronic kidney disease: Secondary | ICD-10-CM | POA: Diagnosis not present

## 2020-05-29 DIAGNOSIS — D61818 Other pancytopenia: Secondary | ICD-10-CM | POA: Diagnosis not present

## 2020-05-29 DIAGNOSIS — K922 Gastrointestinal hemorrhage, unspecified: Secondary | ICD-10-CM | POA: Diagnosis not present

## 2020-05-29 DIAGNOSIS — D62 Acute posthemorrhagic anemia: Secondary | ICD-10-CM | POA: Diagnosis not present

## 2020-05-29 DIAGNOSIS — K633 Ulcer of intestine: Secondary | ICD-10-CM | POA: Diagnosis not present

## 2020-05-29 DIAGNOSIS — Z452 Encounter for adjustment and management of vascular access device: Secondary | ICD-10-CM | POA: Diagnosis not present

## 2020-05-29 DIAGNOSIS — K648 Other hemorrhoids: Secondary | ICD-10-CM | POA: Diagnosis not present

## 2020-05-29 DIAGNOSIS — I129 Hypertensive chronic kidney disease with stage 1 through stage 4 chronic kidney disease, or unspecified chronic kidney disease: Secondary | ICD-10-CM | POA: Diagnosis not present

## 2020-05-29 DIAGNOSIS — D631 Anemia in chronic kidney disease: Secondary | ICD-10-CM | POA: Diagnosis not present

## 2020-05-30 ENCOUNTER — Encounter: Payer: Medicare Other | Admitting: Nutrition

## 2020-05-30 ENCOUNTER — Ambulatory Visit: Payer: Medicare Other

## 2020-05-30 LAB — SARS-COV-2, NAA 2 DAY TAT

## 2020-05-30 LAB — NOVEL CORONAVIRUS, NAA: SARS-CoV-2, NAA: NOT DETECTED

## 2020-06-01 ENCOUNTER — Encounter: Payer: Self-pay | Admitting: Hematology and Oncology

## 2020-06-01 ENCOUNTER — Inpatient Hospital Stay: Payer: Medicare Other

## 2020-06-01 ENCOUNTER — Inpatient Hospital Stay: Payer: Medicare Other | Attending: Hematology and Oncology | Admitting: Hematology and Oncology

## 2020-06-01 ENCOUNTER — Other Ambulatory Visit: Payer: Self-pay | Admitting: Hematology and Oncology

## 2020-06-01 ENCOUNTER — Other Ambulatory Visit: Payer: Self-pay

## 2020-06-01 VITALS — BP 131/61 | HR 89 | Temp 97.5°F | Resp 18 | Ht 64.0 in | Wt 122.6 lb

## 2020-06-01 DIAGNOSIS — R7989 Other specified abnormal findings of blood chemistry: Secondary | ICD-10-CM | POA: Diagnosis not present

## 2020-06-01 DIAGNOSIS — R161 Splenomegaly, not elsewhere classified: Secondary | ICD-10-CM | POA: Diagnosis not present

## 2020-06-01 DIAGNOSIS — Z95828 Presence of other vascular implants and grafts: Secondary | ICD-10-CM

## 2020-06-01 DIAGNOSIS — Z20822 Contact with and (suspected) exposure to covid-19: Secondary | ICD-10-CM | POA: Insufficient documentation

## 2020-06-01 DIAGNOSIS — D500 Iron deficiency anemia secondary to blood loss (chronic): Secondary | ICD-10-CM | POA: Diagnosis not present

## 2020-06-01 DIAGNOSIS — Z87891 Personal history of nicotine dependence: Secondary | ICD-10-CM | POA: Diagnosis not present

## 2020-06-01 DIAGNOSIS — C8580 Other specified types of non-Hodgkin lymphoma, unspecified site: Secondary | ICD-10-CM

## 2020-06-01 DIAGNOSIS — C8514 Unspecified B-cell lymphoma, lymph nodes of axilla and upper limb: Secondary | ICD-10-CM | POA: Insufficient documentation

## 2020-06-01 LAB — RETIC PANEL
Immature Retic Fract: 13.3 % (ref 2.3–15.9)
RBC.: 3.79 MIL/uL — ABNORMAL LOW (ref 3.87–5.11)
Retic Count, Absolute: 121.7 10*3/uL (ref 19.0–186.0)
Retic Ct Pct: 3.2 % — ABNORMAL HIGH (ref 0.4–3.1)
Reticulocyte Hemoglobin: 37.4 pg (ref >27.9)

## 2020-06-01 LAB — CMP (CANCER CENTER ONLY)
ALT: 11 U/L (ref 0–44)
AST: 19 U/L (ref 15–41)
Albumin: 3.5 g/dL (ref 3.5–5.0)
Alkaline Phosphatase: 56 U/L (ref 38–126)
Anion gap: 9 (ref 5–15)
BUN: 13 mg/dL (ref 8–23)
CO2: 23 mmol/L (ref 22–32)
Calcium: 9.3 mg/dL (ref 8.9–10.3)
Chloride: 105 mmol/L (ref 98–111)
Creatinine: 1.29 mg/dL — ABNORMAL HIGH (ref 0.44–1.00)
GFR, Est AFR Am: 46 mL/min — ABNORMAL LOW (ref >60)
GFR, Estimated: 40 mL/min — ABNORMAL LOW (ref >60)
Glucose, Bld: 97 mg/dL (ref 70–99)
Potassium: 3.9 mmol/L (ref 3.5–5.1)
Sodium: 137 mmol/L (ref 135–145)
Total Bilirubin: 0.5 mg/dL (ref 0.3–1.2)
Total Protein: 6 g/dL — ABNORMAL LOW (ref 6.5–8.1)

## 2020-06-01 LAB — CBC WITH DIFFERENTIAL (CANCER CENTER ONLY)
Abs Immature Granulocytes: 0.06 10*3/uL (ref 0.00–0.07)
Basophils Absolute: 0.1 10*3/uL (ref 0.0–0.1)
Basophils Relative: 1 %
Eosinophils Absolute: 0.2 10*3/uL (ref 0.0–0.5)
Eosinophils Relative: 5 %
HCT: 33.2 % — ABNORMAL LOW (ref 36.0–46.0)
Hemoglobin: 11.3 g/dL — ABNORMAL LOW (ref 12.0–15.0)
Immature Granulocytes: 1 %
Lymphocytes Relative: 65 %
Lymphs Abs: 2.8 10*3/uL (ref 0.7–4.0)
MCH: 29.7 pg (ref 26.0–34.0)
MCHC: 34 g/dL (ref 30.0–36.0)
MCV: 87.4 fL (ref 80.0–100.0)
Monocytes Absolute: 0.4 10*3/uL (ref 0.1–1.0)
Monocytes Relative: 9 %
Neutro Abs: 0.8 10*3/uL — ABNORMAL LOW (ref 1.7–7.7)
Neutrophils Relative %: 19 %
Platelet Count: 100 10*3/uL — ABNORMAL LOW (ref 150–400)
RBC: 3.8 MIL/uL — ABNORMAL LOW (ref 3.87–5.11)
RDW: 17.9 % — ABNORMAL HIGH (ref 11.5–15.5)
WBC Count: 4.2 10*3/uL (ref 4.0–10.5)
nRBC: 0 % (ref 0.0–0.2)

## 2020-06-01 LAB — LACTATE DEHYDROGENASE: LDH: 217 U/L — ABNORMAL HIGH (ref 98–192)

## 2020-06-01 MED ORDER — HEPARIN SOD (PORK) LOCK FLUSH 100 UNIT/ML IV SOLN
500.0000 [IU] | Freq: Once | INTRAVENOUS | Status: AC | PRN
  Administered 2020-06-01: 500 [IU]
  Filled 2020-06-01: qty 5

## 2020-06-01 MED ORDER — SODIUM CHLORIDE 0.9% FLUSH
10.0000 mL | INTRAVENOUS | Status: DC | PRN
  Administered 2020-06-01: 10 mL
  Filled 2020-06-01: qty 10

## 2020-06-01 NOTE — Patient Instructions (Signed)

## 2020-06-01 NOTE — Progress Notes (Signed)
[place] [phone]   [phone]  PROGRESS NOTE  Patient Care Team: Ma Hillock, DO as PCP - General (Family Medicine) Danie Binder, MD (Inactive) as Consulting Physician (Gastroenterology) Gala Romney Cristopher Estimable, MD as Consulting Physician (Gastroenterology) Minus Breeding, MD as Consulting Physician (Cardiology) Annitta Needs, NP (Gastroenterology) Carlis Stable, NP as Nurse Practitioner (Gastroenterology)  Hematological/Oncological History  # Low Grade Marginal Zone Lymphoma, Stage III 1) 06/06/2019: patient underwent a diagnostic mammogram of the left breast. Calcifications noted in the left breast, recommended 6 month f/u imaging. 2) 02/02/2020: bilateral diagnostic mammogram showedabnormal enlarged lymph nodes with cortical thickening. The largest of these measures 2.2 x 1.7 x 2.0 cm. 3) 02/02/2020: US guided biopsy of left axillary lymph nodes performed. 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 4) 02/16/2020: establish care with Dr. Lorenso Courier 5) 02/29/2020: PET CT scan performed, showed hypermetabolic lymph nodes identified in the posterior left neck, both axillary/subpectoral regions, mediastinum, hila, right external iliac chain, and right groin. Massive splenomegaly of 17.8 cm noted as well.  6) 03/20/2020: excisional biopsy of left posterior cervical lymph node. Biopsy confirmed most likely low grade marginal zone lymphoma.   7) 7/14-7/15/2021: Cycle 1 Day 1 of Rituximab/Bendamycin 8) 03/30/2020-04/01/2020: admitted inpatient for hematemesis. Noted to have marked esophagitis on EGD.  9) 04/25/2020: IV feraheme 510mg  administered 10) 04/26/2020: Cycle 2 Day 1 of Rituximab/Bendamycin 11) 05/10/2020: HOLD R-Benda in setting of colitis, GI bleed, and  intolerance to therapy. Plan to start monotherapy Rituximab once patient has rebounded.   Interval History:  Elizabeth Mcintyre 79 y.o. 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 The patient's last visit was on 05/10/2020 for post-discharge f/u. In the interim she had a COVID exposure, but tested negative.   On exam today Elizabeth Mcintyre notes she still feels quite wiped out.  She notes that she does not feel any improvement from last time and that she has no energy.  She is also concerned that her memory is not as sharp as it was previously.  Her daughter reports that she is improving some and she sees her getting stronger a little every day.  She notes that she was able to walk through [place] the other day while pushing the cart.  There is also concerned the patient has virtually no appetite.  She only eats about "part of 1 meal per day".  She does drink 1 boost per day and yesterday reportedly only ate half of a ham biscuit.  She has been trying to keep up with hydration drinking Gatorade and water's, and her daughter recently purchased her some Pedialyte.  Despite this she has been having once daily bowel movements.  In terms of her Covid exposure her daughter reportedly picked up her small child from school and he later tested positive for Covid infection.  The patient and her daughter were both tested and found to be negative.  She otherwise denies having issues with fevers, chills, sweats, nausea, vomiting or diarrhea.  A full 10 point ROS is listed below.  MEDICAL HISTORY:  Past Medical History:  Diagnosis Date  . Asthma   . Bloating 04/26/2019  . Cancer ([facility]) 2021   Lymphoma  . 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 Depression  with anxiety 04/03/2011  . DIABETES MELLITUS, TYPE II 11/09/2007   diet control  . Diverticulosis 03/2011  . GERD (gastroesophageal reflux disease)    none recently  . GI bleed   .  History of rheumatoid arthritis    during 30's, was treated.  . Hyperlipidemia   . Hypertension   . IBS (irritable bowel syndrome)   . Osteopenia 2017   Last  bone density 05/04/2017: -2.4  . PONV (postoperative nausea and vomiting)   . Stress incontinence   . Stroke [facility])    mini stroke - found on a CT scan    SURGICAL HISTORY: Past Surgical History:  Procedure Laterality Date  . ABDOMINAL HYSTERECTOMY    . BIOPSY  03/30/2020   Procedure: BIOPSY;  Surgeon: Ronald Lobo, MD;  Location: [facility];  Service: Endoscopy;;  . BIOPSY  05/02/2020   Procedure: BIOPSY;  Surgeon: Montez Morita, Quillian Quince, MD;  Location: [facility];  Service: Gastroenterology;;  . CARDIAC CATHETERIZATION     X 2, last one in 1998  . CHOLECYSTECTOMY N/A 04/13/2017   Procedure: LAPAROSCOPIC CHOLECYSTECTOMY;  Surgeon: Aviva Signs, MD;  Location: [facility];  Service: General;  Laterality: N/A;  . COLONOSCOPY    . COLONOSCOPY  May 2012   Dr. Olevia Perches: mild diverticulosis, otherwise normal.   . COLONOSCOPY WITH PROPOFOL N/A 05/02/2020   Procedure: COLONOSCOPY WITH PROPOFOL;  Surgeon: Harvel Quale, MD;  [facility]: [facility];  Service: Gastroenterology;  Laterality: N/A;  . ESOPHAGOGASTRODUODENOSCOPY N/A 01/28/2015   Dr. Gala Romney: reflux esophagitis, Schatzki's ring not manipulated due to recent bleeding  . ESOPHAGOGASTRODUODENOSCOPY N/A 03/30/2015   Dr. Gala Romney: Schatzki's ring s/p Venia Minks dilation, previously noted esophageal ulcer completely healed  . ESOPHAGOGASTRODUODENOSCOPY N/A 03/30/2020   Procedure: ESOPHAGOGASTRODUODENOSCOPY (EGD);  Surgeon: Ronald Lobo, MD;  Location: [facility];  Service: Endoscopy;  Laterality: N/A;  . IR IMAGING GUIDED PORT INSERTION  03/13/2020  . LYMPH NODE BIOPSY Left 03/20/2020   Procedure: LEFT POSTERIOR CERVICAL LYMPH NODE BIOPSY;  Surgeon: Georganna Skeans, MD;  Location: [facility];  Service: General;  Laterality: Left;  Marland Kitchen MALONEY DILATION N/A 03/30/2015   Procedure:  Venia Minks DILATION;  Surgeon: Daneil Dolin, MD;  [facility];  Service: Endoscopy;  Laterality: N/A;  . POLYPECTOMY  05/02/2020   Procedure: POLYPECTOMY;  Surgeon: Harvel Quale, MD;  [facility];  Service: Gastroenterology;;    SOCIAL HISTORY: Social History   Socioeconomic History  . Marital status: Widowed    Spouse name: Not on file  . Number of children: 2  . Years of education: Not on file  . Highest education level: Not on file  Occupational History  . Occupation: retired  Tobacco Use  . Smoking status: Never Smoker  . Smokeless tobacco: Never Used  Vaping Use  . Vaping Use: Never used  Substance and Sexual Activity  . Alcohol use: No  . Drug use: No  . Sexual activity: Never  Other Topics Concern  . Not on file  Social History Narrative   Ms. Prine is widowed. Her young grandson lives with her, for whom she shares custody with her daughter, the son's aunt.     Social Determinants of Health   Financial Resource Strain:   . Difficulty of Paying Living Expenses: Not on file  Food Insecurity:   . Worried About Charity fundraiser in the Last Year: Not on file  . Ran Out of Food in the Last Year: Not on file  Transportation Needs:   . Film/video editor (Medical): Not on file  . Lack of Transportation (Non-Medical): Not on file  Physical Activity:   . Days of Exercise per Week: Not on file  . Minutes of Exercise per Session: Not on file  Stress:   . Feeling of Stress : Not on file  Social Connections:   . Frequency of Communication with Friends and Family: Not on file  . Frequency of Social Gatherings with Friends and Family: Not on file  . Attends Religious Services: Not on file  . Active Member of Clubs or Organizations: Not on file  . Attends Archivist Meetings: Not on file  . Marital Status: Not on file  Intimate Partner Violence:   . Fear of Current or Ex-Partner: Not on file  . Emotionally Abused: Not  on file  . Physically Abused: Not on file  . Sexually Abused: Not on file    FAMILY HISTORY: Family History  Problem Relation Age of Onset  . Heart disease Mother   . Osteoarthritis Mother   . Sudden death Father   . Single kidney Father   . Other Father        h/o severe MVA injuries  . Hyperlipidemia Sister   . Other Daughter        Myalgias  . Fibromyalgia Daughter   . Allergies Daughter   . Heart disease Maternal Grandfather   . Sudden death Paternal Grandmother   . Diabetes Paternal Grandfather   . Heart disease Daughter   . Other Daughter        palpitations  . Pulmonary fibrosis Maternal Aunt   . Cancer Paternal Uncle   . Pulmonary fibrosis Maternal Aunt   . Colon cancer Neg Hx     ALLERGIES:  is allergic to codeine phosphate.  MEDICATIONS:  Current Outpatient Medications  Medication Sig Dispense Refill  . acetaminophen (TYLENOL) 325 MG tablet Take 650 mg by mouth every 6 (six) hours as needed for moderate pain.     Marland Kitchen albuterol (VENTOLIN HFA) 108 (90 Base) MCG/ACT inhaler Inhale 2 puffs into the lungs every 6 (six) hours as needed for wheezing. 8.5 g 2  . allopurinol (ZYLOPRIM) 300 MG tablet Take 1 tablet (300 mg total) by mouth daily. 30 tablet 3  . Cholecalciferol (VITAMIN D3) 25 MCG (1000 UT) CAPS Take 1,000 Units by mouth daily.     Marland Kitchen dicyclomine (BENTYL) 10 MG capsule Take 1 capsule (10 mg total) by mouth 4 (four) times daily -  before meals and at bedtime. Needs appt for further refills. (Patient taking differently: Take 10 mg by mouth 3 (three) times daily as needed for spasms. ) 360 capsule 0  . levothyroxine (SYNTHROID) 75 MCG tablet Take 1 tablet (75 mcg total) by mouth daily. 90 tablet 3  . lidocaine-prilocaine (EMLA) cream Apply 1 application topically as needed. 30 g 1  . ondansetron (ZOFRAN) 8 MG tablet Take 1 tablet (8 mg total) by mouth every 8 (eight) hours as needed for nausea or vomiting. 30 tablet 1  . pantoprazole (PROTONIX) 40 MG tablet Take 1  tablet (40 mg total) by mouth 2 (two) times daily. After 8 weeks take only once daily. 60 tablet 1  . prochlorperazine (COMPAZINE) 10 MG tablet Take 1 tablet (10 mg total) by mouth every 6 (six) hours as needed for nausea or vomiting. 30 tablet 1  . rOPINIRole (REQUIP) 0.25 MG tablet Take 1 tablet (0.25 mg total) by mouth at bedtime. [place]  tablet 1  . vitamin B-12 (CYANOCOBALAMIN) 1000 MCG tablet Take 1,000 mcg by mouth daily.    Marland Kitchen amLODipine (NORVASC) 10 MG tablet Take 1 tablet (10 mg total) by mouth daily. (Patient not taking: Reported on 05/10/2020) 90 tablet 1  . loperamide (IMODIUM) 2 MG capsule Take 1 capsule (2 mg total) by mouth every 6 (six) hours as needed for diarrhea or loose stools. (Patient not taking: Reported on 06/01/2020) 30 capsule 0  . magic mouthwash w/lidocaine SOLN Take 5 mLs by mouth 4 (four) times daily as needed for mouth pain. (Patient not taking: Reported on 06/01/2020) 260 mL 1  . nystatin (MYCOSTATIN) 100000 UNIT/ML suspension Take 5 mLs by mouth 4 (four) times daily. (Patient not taking: Reported on 06/01/2020)    . potassium chloride SA (KLOR-CON) 20 MEQ tablet Take 1 tablet (20 mEq total) by mouth daily. (Patient not taking: Reported on 06/01/2020) 14 tablet 0  . sucralfate (CARAFATE) 1 GM/10ML suspension Take 10 mLs (1 g total) by mouth 4 (four) times daily as needed (for throat/mouth burning). (Patient not taking: Reported on 05/10/2020) 420 mL 1   No current facility-administered medications for this visit.   Facility-Administered Medications Ordered in Other Visits  Medication Dose Route Frequency Provider Last Rate Last Admin  . sodium chloride flush (NS) 0.9 % injection 10 mL  10 mL Intracatheter PRN Orson Slick, MD   10 mL at 06/01/20 1455    REVIEW OF SYSTEMS:   Constitutional: ( - ) fevers, ( - )  chills , ( - ) night sweats Eyes: ( - ) blurriness of vision, ( - ) double vision, ( - ) watery eyes Ears, nose, mouth, throat, and face: ( - ) mucositis, ( - )  sore throat Respiratory: ( - ) cough, ( - ) dyspnea, ( - ) wheezes Cardiovascular: ( - ) palpitation, ( - ) chest discomfort, ( - ) lower extremity swelling Gastrointestinal:  ( - ) nausea, ( - ) heartburn, ( - ) change in bowel habits Skin: ( - ) abnormal skin rashes Lymphatics: ( - ) new lymphadenopathy, ( - ) easy bruising Neurological: ( - ) numbness, ( - ) tingling, ( - ) new weaknesses Behavioral/Psych: ( - ) mood change, ( - ) new changes  All other systems were reviewed with the patient and are negative.  PHYSICAL EXAMINATION: ECOG PERFORMANCE STATUS: 1 - Symptomatic but completely ambulatory  Vitals:   06/01/20 1520  BP: 131/61  Pulse: 89  Resp: 18  Temp: (!) 97.5 F (36.4 C)  SpO2: 100%   Filed Weights   06/01/20 1520  Weight: 122 lb 9.6 oz (55.6 kg)    GENERAL: well appearing elderly Caucasian female in NAD  SKIN: skin color, texture, turgor are normal. Small dime sized dry skin lesions on chest, about 5 in number.  EYES: conjunctiva are pink and non-injected, sclera clear NECK: supple, non-tender. Healing incision on left neck at site of excisional biopsy.  LUNGS: clear to auscultation and percussion with normal breathing effort HEART: regular rate & rhythm and no murmurs and no lower extremity edema Musculoskeletal: no cyanosis of digits and no clubbing  PSYCH: alert & oriented x 3, fluent speech NEURO: no focal motor/sensory deficits  LABORATORY DATA:  I have reviewed the data as listed CBC Latest Ref Rng & Units 06/01/2020 05/10/2020 05/04/2020  WBC 4.0 - 10.5 K/uL 4.2 1.3(L) 1.2(LL)  Hemoglobin 12.0 - 15.0 g/dL 11.3(L) 10.4(L) 9.1(L)  Hematocrit 36 - 46 %  33.2(L) 29.6(L) 26.4(L)  Platelets 150 - 400 K/uL 100(L) 126(L) 86(L)    CMP Latest Ref Rng & Units 06/01/2020 05/10/2020 05/05/2020  Glucose 70 - 99 mg/dL 97 108(H) 102(H)  BUN 8 - 23 mg/dL 13 10 6(L)  Creatinine 0.44 - 1.00 mg/dL 1.29(H) 1.31(H) 0.94  Sodium 135 - 145 mmol/L 137 133(L) 133(L)  Potassium  3.5 - 5.1 mmol/L 3.9 3.2(L) 3.7  Chloride 98 - 111 mmol/L 105 103 106  CO2 22 - 32 mmol/L 23 23 20(L)  Calcium 8.9 - 10.3 mg/dL 9.3 9.4 7.9(L)  Total Protein 6.5 - 8.1 g/dL 6.0(L) 5.9(L) -  Total Bilirubin 0.3 - 1.2 mg/dL 0.5 0.5 -  Alkaline Phos 38 - 126 U/L 56 65 -  AST 15 - 41 U/L 19 17 -  ALT 0 - 44 U/L 11 6 -    RADIOGRAPHIC STUDIES: I have personally reviewed the radiological images as listed and agreed with the findings in the report: FDG avid spleen and axillary lymph nodes. Involvement of cervical and inguinal nodes as well.  CT Angio Abd/Pel w/ and/or w/o  Result Date: 05/03/2020 CLINICAL DATA:  Mesenteric ischemia, rectal bleeding, abdominal pain EXAM: CTA ABDOMEN AND PELVIS WITH CONTRAST TECHNIQUE: Multidetector CT imaging of the abdomen and pelvis was performed using the standard protocol during bolus administration of intravenous contrast. Multiplanar reconstructed images and MIPs were obtained and reviewed to evaluate the vascular anatomy. CONTRAST:  159mL OMNIPAQUE IOHEXOL 350 MG/ML SOLN well-tolerated COMPARISON:  Noncontrast CT from earlier the same day, and earlier studies FINDINGS: VASCULAR Aorta: Extensive calcified atheromatous plaque particularly in the infrarenal segment. No aneurysm, dissection, or stenosis. Celiac: Calcified ostial plaque resulting in only mild stenosis, patent distally SMA: Calcified ostial plaque resulting in only mild stenosis, atheromatous but patent distally. Renals: Duplicated left, superior dominant with heavily calcified ostial plaque resulting in short segment stenosis of at least mild severity. Single right, with heavily calcified ostial plaque root extending over length of approximately 1.6 cm resulting in at least moderate stenosis, patent distally. IMA: Patent Inflow: Moderate calcified plaque involving bilateral common and internal iliac arteries, without high-grade stenosis. Proximal Outflow: Atheromatous, patent. Veins: Patent hepatic veins,  portal vein, SMV, splenic vein, bilateral renal veins. Iliac venous system and IVC unremarkable. No venous pathology identified. Review of the MIP images confirms the above findings. NON-VASCULAR Lower chest: Tip of central venous catheter noted at the cavoatrial junction. Coronary calcifications. No pleural or pericardial effusion. Linear subpleural scarring or atelectasis posteriorly in the lung bases. Hepatobiliary: No focal liver abnormality is seen. Status post cholecystectomy. No biliary dilatation. Pancreas: Unremarkable. No pancreatic ductal dilatation or surrounding inflammatory changes. Spleen: Normal in size without focal abnormality. Adrenals/Urinary Tract: Adrenal glands unremarkable. 1.5 cm exophytic cyst from the mid right kidney since 04/10/2011. No hydronephrosis. Urinary bladder physiologically distended. Stomach/Bowel: Stomach is nondistended. Small bowel decompressed. Appendix not discretely identified. The colon is nondilated. There is circumferential wall thickening and mild enhancement in the mid sigmoid colon. Lymphatic: No mesenteric, abdominal or pelvic adenopathy. Reproductive: Status post hysterectomy. No adnexal masses. Other: Slight increase in small volume pelvic ascites.  No free air. Musculoskeletal: No acute or significant osseous findings. IMPRESSION: 1. No significant proximal mesenteric arterial occlusive disease to suggest etiology of mesenteric ischemia. 2. Circumferential wall thickening and mild enhancement in the mid sigmoid colon suggesting colitis. Consider colonoscopic follow-up to exclude mucosal lesion. 3. Bilateral renal artery ostial stenoses. 4. Coronary artery disease. Aortic Atherosclerosis (ICD10-I70.0). Electronically Signed   By: Keturah Barre  Vernard Gambles M.D.   On: 05/03/2020 16:43    ASSESSMENT & PLAN Elizabeth Mcintyre 79 y.o. 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  On exam today Elizabeth Mcintyre is still having some trouble recovering.  Her energy levels remain low and her appetite is quite poor.  Her daughter does note that she is improving little by little each day.  Additionally the patient's labs appear to be rebounding quite nicely with now near normal blood counts.  Her creatinine still remains mildly elevated but her potassium has normalized.  Once the patient has rebounded entirely we can consider restarting rituximab monotherapy.  For her current diagonsis I recommend a rituximab monotherapy given her intolerance of BR x 2 cycles. This regimen would consist of rituximab 375 mg/m2 IV q weekly x 4 weeks. During her scans on 8/16 - 05/03/2020 she was shown to have marked response to therapy with normalization of spleen size and resolution of lymphadenopathy.   GELF Criteria: 1 (splenomegaly). Indication for treatment  # Low Grade Marginal Zone Lymphoma, Stage III --plan to HOLD bendamustine + rituximab as patient is unable to tolerate this treatment. Will pursue rituximab monotherapy instead. Started therapy with Cycle 1 Day 1 on 03/28/2020 BR, plan to decrease to Ritux alone q weekly x 4 weeks starting in approximately 2-3 weeks.  --patient completed excisional lymph node biopsy to confirm the diagnosis. Pathological exam of the lymph node confirms marginal zone lymphoma.  --PET CT scan confirms stage III disease. Patient meets GELF criteria for treatment (splenomegaly). She did not have any B symptoms and her counts were stable at diagnosis, with some mild thrombocytopenia.  --patient completed port placement and chemotherapy education. --RTC in 2 weeks  #Concern for GI Bleed #Hematemesis, resolved #Colitis, resolved --patient admitted on 03/30/2020 (day after Cycle 1 chemotherapy) with hematemesis --no overt GI bleeding noted on Upper GI evaluation -- no signs of bleeding since last visit.  --iron levels appeared low previously and patient was having symptoms of iron  deficiency anemia including fatigue, restless leg, and dyspnea on exertion. -- Repeat labs today, Hgb rebounding nicely.   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   No orders of the defined types were placed in this encounter.   All questions were answered. The patient knows to call the clinic with any problems, questions or concerns.  A total of more than 30 minutes were spent on this encounter and over half of that time was spent on counseling and coordination of care as outlined above.   Ledell Peoples, MD Department of Hematology/Oncology [place] at [facility] Phone: [phone] Pager: [phone] Email: [email or web address]  06/01/2020 5:32 PM   Literature Support:  Artist Pais der Angeline Slim BS, Lyna Poser, Hertzberg M, Kwan YL, Lauver D, Craig M, [place], [place], [place], [place] DM, Munteanu M, Aram Beecham JM. 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 Blood. 2014 May 8;123(19):2944-52.  --The overall response rates for BR and R-CHOP/R-CVP were 97% and 91%, respectively (P = .[id number]). These data indicate BR is noninferior to standard therapy with regard  to clinical response with an acceptable safety profile.

## 2020-06-04 LAB — IRON AND TIBC
Iron: 130 ug/dL (ref 41–142)
Saturation Ratios: 59 % — ABNORMAL HIGH (ref 21–57)
TIBC: 221 ug/dL — ABNORMAL LOW (ref 236–444)
UIBC: 92 ug/dL — ABNORMAL LOW (ref 120–384)

## 2020-06-04 LAB — FERRITIN: Ferritin: 2045 ng/mL — ABNORMAL HIGH (ref 11–307)

## 2020-06-05 ENCOUNTER — Telehealth: Payer: Self-pay | Admitting: Hematology and Oncology

## 2020-06-05 NOTE — Telephone Encounter (Signed)
Scheduled per los. Called and spoke with patients daughter, [name]. Confirmed appt

## 2020-06-13 ENCOUNTER — Other Ambulatory Visit: Payer: Self-pay | Admitting: Hematology and Oncology

## 2020-06-13 DIAGNOSIS — C8580 Other specified types of non-Hodgkin lymphoma, unspecified site: Secondary | ICD-10-CM

## 2020-06-13 NOTE — Progress Notes (Signed)
[place] [phone]   [phone]  PROGRESS NOTE  Patient Care Team: Ma Hillock, DO as PCP - General (Family Medicine) Danie Binder, MD (Inactive) as Consulting Physician (Gastroenterology) Gala Romney Cristopher Estimable, MD as Consulting Physician (Gastroenterology) Minus Breeding, MD as Consulting Physician (Cardiology) Annitta Needs, NP (Gastroenterology) Carlis Stable, NP as Nurse Practitioner (Gastroenterology)  Hematological/Oncological History  # Low Grade Marginal Zone Lymphoma, Stage III 1) 06/06/2019: patient underwent a diagnostic mammogram of the left breast. Calcifications noted in the left breast, recommended 6 month f/u imaging. 2) 02/02/2020: bilateral diagnostic mammogram showedabnormal enlarged lymph nodes with cortical thickening. The largest of these measures 2.2 x 1.7 x 2.0 cm. 3) 02/02/2020: US guided biopsy of left axillary lymph nodes performed. 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 4) 02/16/2020: establish care with Dr. Lorenso Courier 5) 02/29/2020: PET CT scan performed, showed hypermetabolic lymph nodes identified in the posterior left neck, both axillary/subpectoral regions, mediastinum, hila, right external iliac chain, and right groin. Massive splenomegaly of 17.8 cm noted as well.  6) 03/20/2020: excisional biopsy of left posterior cervical lymph node. Biopsy confirmed most likely low grade marginal zone lymphoma.   7) 7/14-7/15/2021: Cycle 1 Day 1 of Rituximab/Bendamycin 8) 03/30/2020-04/01/2020: admitted inpatient for hematemesis. Noted to have marked esophagitis on EGD.  9) 04/25/2020: IV feraheme 510mg  administered 10) 04/26/2020: Cycle 2 Day 1 of Rituximab/Bendamycin 11) 05/10/2020: HOLD R-Benda in setting of colitis, GI bleed, and  intolerance to therapy. Plan to start monotherapy Rituximab once patient has rebounded.   Interval History:  SCOTTY PINDER 79 y.o. 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 The patient's last visit was on 06/01/2020 for continued f/u. In the interim she has had no hospitalizations or ED visits.   On exam today Mrs. Banbury notes that her strength is coming back. She does continue to have poor appetite and only eats "about a handful" every day. She notes that he does not have much energy and has trouble getting things done. She notes that she does have weak spells and occasional lightheadedness. She notes that she does lie down about twice a day. She denies having issues with fevers, chills, sweats, nausea, vomiting or diarrhea. A 14 point ROS is listed below.  MEDICAL HISTORY:  Past Medical History:  Diagnosis Date  . Asthma   . Bloating 04/26/2019  . Cancer ([place]) 2021   Lymphoma  . 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 DIABETES MELLITUS, TYPE II 11/09/2007   diet control  . Diverticulosis 03/2011  . GERD (gastroesophageal reflux disease)    none recently  . GI bleed   . History of rheumatoid arthritis    during 30's, was treated.  . Hyperlipidemia   . Hypertension   . IBS (irritable bowel syndrome)   . Osteopenia 2017   Last  bone density 05/04/2017: -2.4  . PONV (postoperative nausea and vomiting)   . Stress incontinence   . Stroke [facility])    mini stroke - found on a CT scan    SURGICAL HISTORY: Past Surgical History:  Procedure Laterality Date  . ABDOMINAL HYSTERECTOMY    . BIOPSY  03/30/2020   Procedure: BIOPSY;  Surgeon: Ronald Lobo, MD;  Location:  [facility];  Service: Endoscopy;;  . BIOPSY  05/02/2020   Procedure: BIOPSY;  Surgeon: Harvel Quale, MD;  Location: [facility];  Service: Gastroenterology;;  . CARDIAC CATHETERIZATION      X 2, last one in 1998  . CHOLECYSTECTOMY N/A 04/13/2017   Procedure: LAPAROSCOPIC CHOLECYSTECTOMY;  Surgeon: Aviva Signs, MD;  Location: [facility];  Service: General;  Laterality: N/A;  . COLONOSCOPY    . COLONOSCOPY  May 2012   Dr. Olevia Perches: mild diverticulosis, otherwise normal.   . COLONOSCOPY WITH PROPOFOL N/A 05/02/2020   Procedure: COLONOSCOPY WITH PROPOFOL;  Surgeon: Harvel Quale, MD;  Location: [facility];  Service: Gastroenterology;  Laterality: N/A;  . ESOPHAGOGASTRODUODENOSCOPY N/A 01/28/2015   Dr. Gala Romney: reflux esophagitis, Schatzki's ring not manipulated due to recent bleeding  . ESOPHAGOGASTRODUODENOSCOPY N/A 03/30/2015   Dr. Gala Romney: Schatzki's ring s/p Venia Minks dilation, previously noted esophageal ulcer completely healed  . ESOPHAGOGASTRODUODENOSCOPY N/A 03/30/2020   Procedure: ESOPHAGOGASTRODUODENOSCOPY (EGD);  Surgeon: Ronald Lobo, MD;  Location: [facility];  Service: Endoscopy;  Laterality: N/A;  . IR IMAGING GUIDED PORT INSERTION  03/13/2020  . LYMPH NODE BIOPSY Left 03/20/2020   Procedure: LEFT POSTERIOR CERVICAL LYMPH NODE BIOPSY;  Surgeon: Georganna Skeans, MD;  Location: [place];  Service: General;  Laterality: Left;  Marland Kitchen MALONEY DILATION N/A 03/30/2015   Procedure: Venia Minks DILATION;  Surgeon: Daneil Dolin, MD;  Location: [facility];  Service: Endoscopy;  Laterality: N/A;  . POLYPECTOMY  05/02/2020   Procedure: POLYPECTOMY;  Surgeon: Harvel Quale, MD;  Location: [facility];  Service: Gastroenterology;;    SOCIAL HISTORY: Social History   Socioeconomic History  . Marital status: Widowed    Spouse name: Not on file  . Number of children: 2  . Years of education: Not on file  . Highest education level: Not on file  Occupational History  . Occupation: retired  Tobacco Use  . Smoking status: Never Smoker  . Smokeless tobacco: Never Used  Vaping Use  . Vaping Use: Never used  Substance and Sexual Activity  . Alcohol use: No  . Drug  use: No  . Sexual activity: Never  Other Topics Concern  . Not on file  Social History Narrative   Ms. Matthes is widowed. Her young grandson lives with her, for whom she shares custody with her daughter, the son's aunt.     Social Determinants of Health   Financial Resource Strain:   . Difficulty of Paying Living Expenses: Not on file  Food Insecurity:   . Worried About Charity fundraiser in the Last Year: Not on file  . Ran Out of Food in the Last Year: Not on file  Transportation Needs:   . Lack of Transportation (Medical): Not on file  . Lack of Transportation (Non-Medical): Not on file  Physical Activity:   . Days of Exercise per Week: Not on file  . Minutes of Exercise per Session: Not on file  Stress:   . Feeling of Stress : Not on file  Social Connections:   . Frequency of Communication with Friends and Family: Not on file  . Frequency of Social Gatherings with Friends and Family: Not on file  . Attends Religious Services: Not on file  . Active Member of Clubs or Organizations: Not on file  . Attends Archivist Meetings: Not on file  . Marital Status: Not on file  Intimate Partner Violence:   . Fear of Current or Ex-Partner: Not on  file  . Emotionally Abused: Not on file  . Physically Abused: Not on file  . Sexually Abused: Not on file    FAMILY HISTORY: Family History  Problem Relation Age of Onset  . Heart disease Mother   . Osteoarthritis Mother   . Sudden death Father   . Single kidney Father   . Other Father        h/o severe MVA injuries  . Hyperlipidemia Sister   . Other Daughter        Myalgias  . Fibromyalgia Daughter   . Allergies Daughter   . Heart disease Maternal Grandfather   . Sudden death Paternal Grandmother   . Diabetes Paternal Grandfather   . Heart disease Daughter   . Other Daughter        palpitations  . Pulmonary fibrosis Maternal Aunt   . Cancer Paternal Uncle   . Pulmonary fibrosis Maternal Aunt   . Colon cancer  Neg Hx     ALLERGIES:  is allergic to codeine phosphate.  MEDICATIONS:  Current Outpatient Medications  Medication Sig Dispense Refill  . acetaminophen (TYLENOL) 325 MG tablet Take 650 mg by mouth every 6 (six) hours as needed for moderate pain.     Marland Kitchen albuterol (VENTOLIN HFA) 108 (90 Base) MCG/ACT inhaler Inhale 2 puffs into the lungs every 6 (six) hours as needed for wheezing. 8.5 g 2  . allopurinol (ZYLOPRIM) 300 MG tablet Take 1 tablet (300 mg total) by mouth daily. 30 tablet 3  . amLODipine (NORVASC) 10 MG tablet Take 1 tablet (10 mg total) by mouth daily. (Patient not taking: Reported on 05/10/2020) 90 tablet 1  . Cholecalciferol (VITAMIN D3) 25 MCG (1000 UT) CAPS Take 1,000 Units by mouth daily.     Marland Kitchen dicyclomine (BENTYL) 10 MG capsule Take 1 capsule (10 mg total) by mouth 4 (four) times daily -  before meals and at bedtime. Needs appt for further refills. (Patient taking differently: Take 10 mg by mouth 3 (three) times daily as needed for spasms. ) 360 capsule 0  . levothyroxine (SYNTHROID) 75 MCG tablet Take 1 tablet (75 mcg total) by mouth daily. 90 tablet 3  . lidocaine-prilocaine (EMLA) cream Apply 1 application topically as needed. 30 g 1  . loperamide (IMODIUM) 2 MG capsule Take 1 capsule (2 mg total) by mouth every 6 (six) hours as needed for diarrhea or loose stools. (Patient not taking: Reported on 06/01/2020) 30 capsule 0  . magic mouthwash w/lidocaine SOLN Take 5 mLs by mouth 4 (four) times daily as needed for mouth pain. (Patient not taking: Reported on 06/01/2020) 260 mL 1  . nystatin (MYCOSTATIN) 100000 UNIT/ML suspension Take 5 mLs by mouth 4 (four) times daily. (Patient not taking: Reported on 06/01/2020)    . ondansetron (ZOFRAN) 8 MG tablet Take 1 tablet (8 mg total) by mouth every 8 (eight) hours as needed for nausea or vomiting. 30 tablet 1  . pantoprazole (PROTONIX) 40 MG tablet Take 1 tablet (40 mg total) by mouth 2 (two) times daily. After 8 weeks take only once daily.  60 tablet 1  . potassium chloride SA (KLOR-CON) 20 MEQ tablet Take 1 tablet (20 mEq total) by mouth daily. (Patient not taking: Reported on 06/01/2020) 14 tablet 0  . prochlorperazine (COMPAZINE) 10 MG tablet Take 1 tablet (10 mg total) by mouth every 6 (six) hours as needed for nausea or vomiting. 30 tablet 1  . rOPINIRole (REQUIP) 0.25 MG tablet Take 1 tablet (0.25 mg total) by  mouth at bedtime. 90 tablet 1  . sucralfate (CARAFATE) 1 GM/10ML suspension Take 10 mLs (1 g total) by mouth 4 (four) times daily as needed (for throat/mouth burning). (Patient not taking: Reported on 05/10/2020) 420 mL 1  . vitamin B-12 (CYANOCOBALAMIN) 1000 MCG tablet Take 1,000 mcg by mouth daily.     No current facility-administered medications for this visit.    REVIEW OF SYSTEMS:   Constitutional: ( - ) fevers, ( - )  chills , ( - ) night sweats Eyes: ( - ) blurriness of vision, ( - ) double vision, ( - ) watery eyes Ears, nose, mouth, throat, and face: ( - ) mucositis, ( - ) sore throat Respiratory: ( - ) cough, ( - ) dyspnea, ( - ) wheezes Cardiovascular: ( - ) palpitation, ( - ) chest discomfort, ( - ) lower extremity swelling Gastrointestinal:  ( - ) nausea, ( - ) heartburn, ( - ) change in bowel habits Skin: ( - ) abnormal skin rashes Lymphatics: ( - ) new lymphadenopathy, ( - ) easy bruising Neurological: ( - ) numbness, ( - ) tingling, ( - ) new weaknesses Behavioral/Psych: ( - ) mood change, ( - ) new changes  All other systems were reviewed with the patient and are negative.  PHYSICAL EXAMINATION: ECOG PERFORMANCE STATUS: 1 - Symptomatic but completely ambulatory  Vitals:   06/14/20 0858  BP: (!) 156/70  Pulse: 82  Temp: 97.8 F (36.6 C)  SpO2: 100%   Filed Weights   06/14/20 0858  Weight: 122 lb 14.4 oz (55.7 kg)    GENERAL: well appearing elderly Caucasian female in NAD  SKIN: skin color, texture, turgor are normal. Small dime sized dry skin lesions on chest, about 5 in number. Improved  from prior EYES: conjunctiva are pink and non-injected, sclera clear NECK: supple, non-tender. Healed incision on left neck at site of excisional biopsy.  LUNGS: clear to auscultation and percussion with normal breathing effort HEART: regular rate & rhythm and no murmurs and no lower extremity edema Musculoskeletal: no cyanosis of digits and no clubbing  PSYCH: alert & oriented x 3, fluent speech NEURO: no focal motor/sensory deficits  LABORATORY DATA:  I have reviewed the data as listed CBC Latest Ref Rng & Units 06/14/2020 06/01/2020 05/10/2020  WBC 4.0 - 10.5 K/uL 4.0 4.2 1.3(L)  Hemoglobin 12.0 - 15.0 g/dL 11.9(L) 11.3(L) 10.4(L)  Hematocrit 36 - 46 % 35.1(L) 33.2(L) 29.6(L)  Platelets 150 - 400 K/uL 148(L) 100(L) 126(L)    CMP Latest Ref Rng & Units 06/14/2020 06/01/2020 05/10/2020  Glucose 70 - 99 mg/dL 109(H) 97 108(H)  BUN 8 - 23 mg/dL 11 13 10   Creatinine 0.44 - 1.00 mg/dL 1.20(H) 1.29(H) 1.31(H)  Sodium 135 - 145 mmol/L 139 137 133(L)  Potassium 3.5 - 5.1 mmol/L 3.6 3.9 3.2(L)  Chloride 98 - 111 mmol/L 108 105 103  CO2 22 - 32 mmol/L 21(L) 23 23  Calcium 8.9 - 10.3 mg/dL 9.3 9.3 9.4  Total Protein 6.5 - 8.1 g/dL 6.0(L) 6.0(L) 5.9(L)  Total Bilirubin 0.3 - 1.2 mg/dL 0.4 0.5 0.5  Alkaline Phos 38 - 126 U/L 61 56 65  AST 15 - 41 U/L 19 19 17   ALT 0 - 44 U/L 15 11 6     RADIOGRAPHIC STUDIES: I have personally reviewed the radiological images as listed and agreed with the findings in the report: FDG avid spleen and axillary lymph nodes. Involvement of cervical and inguinal nodes as well.  No  results found.  ASSESSMENT & PLAN LAVRA IMLER 79 y.o. 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  On exam today Ms. Meixner continues to improve. She notes that her energy levels are improving, but her appetite remains quite poor. At this point she is willing to try and proceed with rituximab  monotherapy.  For her current diagonsis I recommend a rituximab monotherapy given her intolerance of BR x 2 cycles. This regimen would consist of rituximab 375 mg/m2 IV q weekly x 4 weeks. During her scans on 8/16 - 05/03/2020 she was shown to have marked response to therapy with normalization of spleen size and resolution of lymphadenopathy.   GELF Criteria: 1 (splenomegaly). Indication for treatment  # Low Grade Marginal Zone Lymphoma, Stage III --plan to HOLD bendamustine + rituximab as patient is unable to tolerate this treatment. Will pursue rituximab monotherapy instead. Started therapy with Cycle 1 Day 1 on 03/28/2020 BR, plan to decrease to Ritux alone q weekly x 4 weeks starting in approximately 2-3 weeks.  --patient completed excisional lymph node biopsy to confirm the diagnosis. Pathological exam of the lymph node confirms marginal zone lymphoma.  --PET CT scan confirms stage III disease. Patient meets GELF criteria for treatment (splenomegaly). She did not have any B symptoms and her counts were stable at diagnosis, with some mild thrombocytopenia.  --patient completed port placement and chemotherapy education. --RTC in 1-2 weeks to restart rituximab, interval visit after 2 doses.   #Concern for GI Bleed #Hematemesis, resolved #Colitis, resolved --patient admitted on 03/30/2020 (day after Cycle 1 chemotherapy) with hematemesis --no overt GI bleeding noted on Upper GI evaluation -- no signs of bleeding since last visit.  --iron levels appeared low previously and patient was having symptoms of iron deficiency anemia including fatigue, restless leg, and dyspnea on exertion. -- Repeat labs today, Hgb continues rebounding nicely.   #Symptom Management -- zofran 8mg  PO q8H PRN and compazine 10mg  PO q6H PRN --  EMLA cream for patient's port site --allopurinol 300 mg PO daily while on this regimen as a precaution for TLS --for abdominal pain/headaches, can take tylenol 650mg -1000mg  PO q8H  PRN.   No orders of the defined types were placed in this encounter.   All questions were answered. The patient knows to call the clinic with any problems, questions or concerns.  A total of more than 30 minutes were spent on this encounter and over half of that time was spent on counseling and coordination of care as outlined above.   Ledell Peoples, MD Department of Hematology/Oncology [place] [facility] Phone: [phone] Pager: [phone] Email: [email or web address]  06/15/2020 11:33 AM   Literature Support:  Artist Pais der Angeline Slim BS, Levonne Spiller, [place], Willis DM, Munteanu M, Aram Beecham JM. 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 Blood. 2014 May 8;123(19):2944-52.  --The overall response rates for BR and R-CHOP/R-CVP were 97% and 91%, respectively (P = .[id number]). These data indicate BR is noninferior to standard therapy with regard to clinical response with an acceptable safety profile.

## 2020-06-14 ENCOUNTER — Inpatient Hospital Stay: Payer: Medicare Other

## 2020-06-14 ENCOUNTER — Inpatient Hospital Stay (HOSPITAL_BASED_OUTPATIENT_CLINIC_OR_DEPARTMENT_OTHER): Payer: Medicare Other | Admitting: Hematology and Oncology

## 2020-06-14 ENCOUNTER — Other Ambulatory Visit: Payer: Self-pay

## 2020-06-14 VITALS — BP 156/70 | HR 82 | Temp 97.8°F | Ht 64.0 in | Wt 122.9 lb

## 2020-06-14 DIAGNOSIS — D500 Iron deficiency anemia secondary to blood loss (chronic): Secondary | ICD-10-CM

## 2020-06-14 DIAGNOSIS — R161 Splenomegaly, not elsewhere classified: Secondary | ICD-10-CM | POA: Diagnosis not present

## 2020-06-14 DIAGNOSIS — R7989 Other specified abnormal findings of blood chemistry: Secondary | ICD-10-CM | POA: Diagnosis not present

## 2020-06-14 DIAGNOSIS — C8580 Other specified types of non-Hodgkin lymphoma, unspecified site: Secondary | ICD-10-CM

## 2020-06-14 DIAGNOSIS — C8514 Unspecified B-cell lymphoma, lymph nodes of axilla and upper limb: Secondary | ICD-10-CM | POA: Diagnosis not present

## 2020-06-14 DIAGNOSIS — Z95828 Presence of other vascular implants and grafts: Secondary | ICD-10-CM

## 2020-06-14 DIAGNOSIS — Z87891 Personal history of nicotine dependence: Secondary | ICD-10-CM | POA: Diagnosis not present

## 2020-06-14 LAB — CBC WITH DIFFERENTIAL (CANCER CENTER ONLY)
Abs Immature Granulocytes: 0 10*3/uL (ref 0.00–0.07)
Basophils Absolute: 0 10*3/uL (ref 0.0–0.1)
Basophils Relative: 1 %
Eosinophils Absolute: 0.3 10*3/uL (ref 0.0–0.5)
Eosinophils Relative: 7 %
HCT: 35.1 % — ABNORMAL LOW (ref 36.0–46.0)
Hemoglobin: 11.9 g/dL — ABNORMAL LOW (ref 12.0–15.0)
Immature Granulocytes: 0 %
Lymphocytes Relative: 76 %
Lymphs Abs: 3 10*3/uL (ref 0.7–4.0)
MCH: 29.5 pg (ref 26.0–34.0)
MCHC: 33.9 g/dL (ref 30.0–36.0)
MCV: 86.9 fL (ref 80.0–100.0)
Monocytes Absolute: 0.4 10*3/uL (ref 0.1–1.0)
Monocytes Relative: 10 %
Neutro Abs: 0.2 10*3/uL — CL (ref 1.7–7.7)
Neutrophils Relative %: 6 %
Platelet Count: 148 10*3/uL — ABNORMAL LOW (ref 150–400)
RBC: 4.04 MIL/uL (ref 3.87–5.11)
RDW: 17.2 % — ABNORMAL HIGH (ref 11.5–15.5)
WBC Count: 4 10*3/uL (ref 4.0–10.5)
nRBC: 0 % (ref 0.0–0.2)

## 2020-06-14 LAB — CMP (CANCER CENTER ONLY)
ALT: 15 U/L (ref 0–44)
AST: 19 U/L (ref 15–41)
Albumin: 3.5 g/dL (ref 3.5–5.0)
Alkaline Phosphatase: 61 U/L (ref 38–126)
Anion gap: 10 (ref 5–15)
BUN: 11 mg/dL (ref 8–23)
CO2: 21 mmol/L — ABNORMAL LOW (ref 22–32)
Calcium: 9.3 mg/dL (ref 8.9–10.3)
Chloride: 108 mmol/L (ref 98–111)
Creatinine: 1.2 mg/dL — ABNORMAL HIGH (ref 0.44–1.00)
GFR, Est AFR Am: 50 mL/min — ABNORMAL LOW (ref >60)
GFR, Estimated: 43 mL/min — ABNORMAL LOW (ref >60)
Glucose, Bld: 109 mg/dL — ABNORMAL HIGH (ref 70–99)
Potassium: 3.6 mmol/L (ref 3.5–5.1)
Sodium: 139 mmol/L (ref 135–145)
Total Bilirubin: 0.4 mg/dL (ref 0.3–1.2)
Total Protein: 6 g/dL — ABNORMAL LOW (ref 6.5–8.1)

## 2020-06-14 LAB — LACTATE DEHYDROGENASE: LDH: 189 U/L (ref 98–192)

## 2020-06-14 MED ORDER — HEPARIN SOD (PORK) LOCK FLUSH 100 UNIT/ML IV SOLN
500.0000 [IU] | Freq: Once | INTRAVENOUS | Status: AC | PRN
  Administered 2020-06-14: 500 [IU]
  Filled 2020-06-14: qty 5

## 2020-06-14 MED ORDER — SODIUM CHLORIDE 0.9% FLUSH
10.0000 mL | INTRAVENOUS | Status: DC | PRN
  Administered 2020-06-14: 10 mL
  Filled 2020-06-14: qty 10

## 2020-06-14 NOTE — Patient Instructions (Signed)

## 2020-06-15 ENCOUNTER — Encounter: Payer: Self-pay | Admitting: Hematology and Oncology

## 2020-06-15 NOTE — Progress Notes (Signed)
DISCONTINUE OFF PATHWAY REGIMEN - Lymphoma and CLL   [id number]:Bendamustine + Rituximab (90/375) q28 Days:   A cycle is every 28 days:     Bendamustine      Rituximab-xxxx   **Always confirm dose/schedule in your pharmacy ordering system**  REASON: Toxicities / Adverse Event PRIOR TREATMENT: Off Pathway: Bendamustine + Rituximab (90/375) q28 Days TREATMENT RESPONSE: Unable to Evaluate  START ON PATHWAY REGIMEN - Lymphoma and CLL     Administer weekly:     Rituximab-xxxx   **Always confirm dose/schedule in your pharmacy ordering system**  Patient Characteristics: Marginal Zone Lymphoma, Systemic, First Line, Symptomatic Disease Type: Marginal Zone Lymphoma Disease Type: Not Applicable Disease Type: Not Applicable Localized or Systemic Disease<= Systemic Line of Therapy: First Line Asymptomatic or Symptomatic<= Symptomatic Intent of Therapy: Non-Curative / Palliative Intent, Discussed with Patient

## 2020-06-18 ENCOUNTER — Other Ambulatory Visit (HOSPITAL_COMMUNITY): Payer: Medicare Other

## 2020-06-19 ENCOUNTER — Ambulatory Visit (HOSPITAL_COMMUNITY): Admit: 2020-06-19 | Payer: Medicare Other

## 2020-06-19 ENCOUNTER — Encounter (HOSPITAL_COMMUNITY): Payer: Self-pay

## 2020-06-19 SURGERY — ESOPHAGOGASTRODUODENOSCOPY (EGD) WITH PROPOFOL
Anesthesia: Monitor Anesthesia Care

## 2020-06-27 ENCOUNTER — Other Ambulatory Visit: Payer: Self-pay | Admitting: Hematology and Oncology

## 2020-06-27 ENCOUNTER — Ambulatory Visit: Payer: Medicare Other | Admitting: Nurse Practitioner

## 2020-06-29 ENCOUNTER — Other Ambulatory Visit: Payer: Medicare Other

## 2020-06-29 ENCOUNTER — Ambulatory Visit: Payer: Medicare Other | Admitting: Hematology and Oncology

## 2020-07-02 NOTE — Progress Notes (Signed)
Pharmacist Chemotherapy Monitoring - Initial Assessment    Anticipated start date: 07/06/2020   Regimen:  . Are orders appropriate based on the patient's diagnosis, regimen, and cycle? Yes . Does the plan date match the patient's scheduled date? Yes . Is the sequencing of drugs appropriate? Yes . Are the premedications appropriate for the patient's regimen? Yes . Prior Authorization for treatment is: Approved o If applicable, is the correct biosimilar selected based on the patient's insurance? yes  Organ Function and Labs: Marland Kitchen Are dose adjustments needed based on the patient's renal function, hepatic function, or hematologic function? Yes . Are appropriate labs ordered prior to the start of patient's treatment? Yes . Other organ system assessment, if indicated: N/A . The following baseline labs, if indicated, have been ordered: rituximab: baseline Hepatitis B labs  Dose Assessment: . Are the drug doses appropriate? Yes . Are the following correct: o Drug concentrations Yes o IV fluid compatible with drug Yes o Administration routes Yes o Timing of therapy Yes . If applicable, does the patient have documented access for treatment and/or plans for port-a-cath placement? yes . If applicable, have lifetime cumulative doses been properly documented and assessed? not applicable Lifetime Dose Tracking  No doses have been documented on this patient for the following tracked chemicals: Doxorubicin, Epirubicin, Idarubicin, Daunorubicin, Mitoxantrone, Bleomycin, Oxaliplatin, Carboplatin, Liposomal Doxorubicin  o   Toxicity Monitoring/Prevention: . The patient has the following take home antiemetics prescribed: Ondansetron and Prochlorperazine . The patient has the following take home medications prescribed: N/A . Medication allergies and previous infusion related reactions, if applicable, have been reviewed and addressed. No . The patient's current medication list has been assessed for  drug-drug interactions with their chemotherapy regimen. no significant drug-drug interactions were identified on review.  Order Review: . Are the treatment plan orders signed? Yes . Is the patient scheduled to see a provider prior to their treatment? Yes  I verify that I have reviewed each item in the above checklist and answered each question accordingly.  Elizabeth Mcintyre D 07/02/2020 4:26 PM

## 2020-07-02 NOTE — Progress Notes (Signed)
[place] [phone]   [phone]  PROGRESS NOTE  Patient Care Team: Ma Hillock, DO as PCP - General (Family Medicine) Danie Binder, MD (Inactive) as Consulting Physician (Gastroenterology) Gala Romney Cristopher Estimable, MD as Consulting Physician (Gastroenterology) Minus Breeding, MD as Consulting Physician (Cardiology) Annitta Needs, NP (Gastroenterology) Carlis Stable, NP as Nurse Practitioner (Gastroenterology)  Hematological/Oncological History  # Low Grade Marginal Zone Lymphoma, Stage III 1) 06/06/2019: patient underwent a diagnostic mammogram of the left breast. Calcifications noted in the left breast, recommended 6 month f/u imaging. 2) 02/02/2020: bilateral diagnostic mammogram showedabnormal enlarged lymph nodes with cortical thickening. The largest of these measures 2.2 x 1.7 x 2.0 cm. 3) 02/02/2020: US guided biopsy of left axillary lymph nodes performed. 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 4) 02/16/2020: establish care with Dr. Lorenso Courier 5) 02/29/2020: PET CT scan performed, showed hypermetabolic lymph nodes identified in the posterior left neck, both axillary/subpectoral regions, mediastinum, hila, right external iliac chain, and right groin. Massive splenomegaly of 17.8 cm noted as well.  6) 03/20/2020: excisional biopsy of left posterior cervical lymph node. Biopsy confirmed most likely low grade marginal zone lymphoma.   7) 7/14-7/15/2021: Cycle 1 Day 1 of Rituximab/Bendamycin 8) 03/30/2020-04/01/2020: admitted inpatient for hematemesis. Noted to have marked esophagitis on EGD.  9) 04/25/2020: IV feraheme 510mg  administered 10) 04/26/2020: Cycle 2 Day 1 of Rituximab/Bendamycin 11) 05/10/2020: HOLD R-Benda in setting of colitis, GI bleed, and  intolerance to therapy. Plan to start monotherapy Rituximab once patient has rebounded.  12) 07/06/2020: Cycle 1 Week 1 of monotherapy rituximab  Interval History:  Elizabeth Mcintyre 79 y.o. 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 The patient's last visit was on 06/15/2020 for continued f/u. In the interim we had planned to start rituximab monotherapy, but due to logistical issues this has been delayed to 07/06/2020.   On exam today Elizabeth Mcintyre notes is been well in the interim since her last visit.  Her weight has been stable at approximately 121 pounds and her appetite has been improved, though still subpar from prior.  She denies having any issues with bleeding, bruising, or dark bowel movements in the interim since her last visit.  She has had no lymphadenopathy in her neck, though is concerned that her thyroid gland feels somewhat larger.  She has been more physically active and is ready and able to continue with therapy.  Of note she does feel that she is having some more difficulty swallowing and is currently being scheduled for esophageal dilation.  A full 10 point ROS is listed below.  MEDICAL HISTORY:  Past Medical History:  Diagnosis Date  . Asthma   . Bloating 04/26/2019  . Cancer ([place]) 2021   Lymphoma  . 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 DIABETES MELLITUS, TYPE II 11/09/2007   diet control  . Diverticulosis 03/2011  . GERD (gastroesophageal reflux disease)    none recently  . GI bleed   . History of rheumatoid arthritis    during 30's, was treated.  . Hyperlipidemia   . Hypertension   . IBS (irritable bowel syndrome)   . Osteopenia 2017   Last  bone density 05/04/2017: -2.4  . PONV (  postoperative nausea and vomiting)   . Stress incontinence   . Stroke [facility])    mini stroke - found on a CT scan    SURGICAL HISTORY: Past Surgical History:    Procedure Laterality Date  . ABDOMINAL HYSTERECTOMY    . BIOPSY  03/30/2020   Procedure: BIOPSY;  Surgeon: Ronald Lobo, MD;  Location: [facility];  Service: Endoscopy;;  . BIOPSY  05/02/2020   Procedure: BIOPSY;  Surgeon: Montez Morita, Quillian Quince, MD;  Location: [facility];  Service: Gastroenterology;;  . CARDIAC CATHETERIZATION     X 2, last one in 1998  . CHOLECYSTECTOMY N/A 04/13/2017   Procedure: LAPAROSCOPIC CHOLECYSTECTOMY;  Surgeon: Aviva Signs, MD;  Location: [facility];  Service: General;  Laterality: N/A;  . COLONOSCOPY    . COLONOSCOPY  May 2012   Dr. Olevia Perches: mild diverticulosis, otherwise normal.   . COLONOSCOPY WITH PROPOFOL N/A 05/02/2020   Procedure: COLONOSCOPY WITH PROPOFOL;  Surgeon: Harvel Quale, MD;  Location: [facility];  Service: Gastroenterology;  Laterality: N/A;  . ESOPHAGOGASTRODUODENOSCOPY N/A 01/28/2015   Dr. Gala Romney: reflux esophagitis, Schatzki's ring not manipulated due to recent bleeding  . ESOPHAGOGASTRODUODENOSCOPY N/A 03/30/2015   Dr. Gala Romney: Schatzki's ring s/p Venia Minks dilation, previously noted esophageal ulcer completely healed  . ESOPHAGOGASTRODUODENOSCOPY N/A 03/30/2020   Procedure: ESOPHAGOGASTRODUODENOSCOPY (EGD);  Surgeon: Ronald Lobo, MD;  Location: [facility];  Service: Endoscopy;  Laterality: N/A;  . IR IMAGING GUIDED PORT INSERTION  03/13/2020  . LYMPH NODE BIOPSY Left 03/20/2020   Procedure: LEFT POSTERIOR CERVICAL LYMPH NODE BIOPSY;  Surgeon: Georganna Skeans, MD;  Location: [facility];  Service: General;  Laterality: Left;  Marland Kitchen MALONEY DILATION N/A 03/30/2015   Procedure: Venia Minks DILATION;  Surgeon: Daneil Dolin, MD;  [facility];  Service: Endoscopy;  Laterality: N/A;  . POLYPECTOMY  05/02/2020   Procedure: POLYPECTOMY;  Surgeon: Harvel Quale, MD;  [facility];  Service: Gastroenterology;;    SOCIAL HISTORY: Social History   Socioeconomic History  . Marital status: Widowed    Spouse  name: Not on file  . Number of children: 2  . Years of education: Not on file  . Highest education level: Not on file  Occupational History  . Occupation: retired  Tobacco Use  . Smoking status: Never Smoker  . Smokeless tobacco: Never Used  Vaping Use  . Vaping Use: Never used  Substance and Sexual Activity  . Alcohol use: No  . Drug use: No  . Sexual activity: Never  Other Topics Concern  . Not on file  Social History Narrative   Ms. Jamerson is widowed. Her young grandson lives with her, for whom she shares custody with her daughter, the son's aunt.     Social Determinants of Health   Financial Resource Strain:   . Difficulty of Paying Living Expenses: Not on file  Food Insecurity:   . Worried About Charity fundraiser in the Last Year: Not on file  . Ran Out of Food in the Last Year: Not on file  Transportation Needs:   . Lack of Transportation (Medical): Not on file  . Lack of Transportation (Non-Medical): Not on file  Physical Activity:   . Days of Exercise per Week: Not on file  . Minutes of Exercise per Session: Not on file  Stress:   . Feeling of Stress : Not on file  Social Connections:   . Frequency of Communication with Friends and Family: Not on file  . Frequency  of Social Gatherings with Friends and Family: Not on file  . Attends Religious Services: Not on file  . Active Member of Clubs or Organizations: Not on file  . Attends Archivist Meetings: Not on file  . Marital Status: Not on file  Intimate Partner Violence:   . Fear of Current or Ex-Partner: Not on file  . Emotionally Abused: Not on file  . Physically Abused: Not on file  . Sexually Abused: Not on file    FAMILY HISTORY: Family History  Problem Relation Age of Onset  . Heart disease Mother   . Osteoarthritis Mother   . Sudden death Father   . Single kidney Father   . Other Father        h/o severe MVA injuries  . Hyperlipidemia Sister   . Other Daughter        Myalgias  .  Fibromyalgia Daughter   . Allergies Daughter   . Heart disease Maternal Grandfather   . Sudden death Paternal Grandmother   . Diabetes Paternal Grandfather   . Heart disease Daughter   . Other Daughter        palpitations  . Pulmonary fibrosis Maternal Aunt   . Cancer Paternal Uncle   . Pulmonary fibrosis Maternal Aunt   . Colon cancer Neg Hx     ALLERGIES:  is allergic to codeine phosphate.  MEDICATIONS:  Current Outpatient Medications  Medication Sig Dispense Refill  . sucralfate (CARAFATE) 1 GM/10ML suspension Take 10 mLs (1 g total) by mouth 4 (four) times daily as needed (for throat/mouth burning). 420 mL 1  . acetaminophen (TYLENOL) 325 MG tablet Take 650 mg by mouth every 6 (six) hours as needed for moderate pain.     Marland Kitchen albuterol (VENTOLIN HFA) 108 (90 Base) MCG/ACT inhaler Inhale 2 puffs into the lungs every 6 (six) hours as needed for wheezing. 8.5 g 2  . allopurinol (ZYLOPRIM) 300 MG tablet Take 1 tablet (300 mg total) by mouth daily. 30 tablet 3  . amLODipine (NORVASC) 10 MG tablet Take 1 tablet (10 mg total) by mouth daily. (Patient not taking: Reported on 05/10/2020) 90 tablet 1  . Cholecalciferol (VITAMIN D3) 25 MCG (1000 UT) CAPS Take 1,000 Units by mouth daily.     Marland Kitchen dicyclomine (BENTYL) 10 MG capsule Take 1 capsule (10 mg total) by mouth 4 (four) times daily -  before meals and at bedtime. Needs appt for further refills. (Patient taking differently: Take 10 mg by mouth 3 (three) times daily as needed for spasms. ) 360 capsule 0  . levothyroxine (SYNTHROID) 75 MCG tablet Take 1 tablet (75 mcg total) by mouth daily. 90 tablet 3  . lidocaine-prilocaine (EMLA) cream Apply 1 application topically as needed. 30 g 1  . loperamide (IMODIUM) 2 MG capsule Take 1 capsule (2 mg total) by mouth every 6 (six) hours as needed for diarrhea or loose stools. (Patient not taking: Reported on 06/01/2020) 30 capsule 0  . magic mouthwash w/lidocaine SOLN Take 5 mLs by mouth 4 (four) times daily  as needed for mouth pain. (Patient not taking: Reported on 06/01/2020) 260 mL 1  . nystatin (MYCOSTATIN) 100000 UNIT/ML suspension Take 5 mLs by mouth 4 (four) times daily. (Patient not taking: Reported on 06/01/2020)    . ondansetron (ZOFRAN) 8 MG tablet Take 1 tablet (8 mg total) by mouth every 8 (eight) hours as needed for nausea or vomiting. 30 tablet 1  . pantoprazole (PROTONIX) 40 MG tablet Take 1 tablet (40  mg total) by mouth 2 (two) times daily. After 8 weeks take only once daily. 60 tablet 1  . potassium chloride SA (KLOR-CON) 20 MEQ tablet Take 1 tablet (20 mEq total) by mouth daily. (Patient not taking: Reported on 06/01/2020) 14 tablet 0  . prochlorperazine (COMPAZINE) 10 MG tablet Take 1 tablet (10 mg total) by mouth every 6 (six) hours as needed for nausea or vomiting. 30 tablet 1  . rOPINIRole (REQUIP) 0.25 MG tablet Take 1 tablet (0.25 mg total) by mouth at bedtime. 90 tablet 1  . vitamin B-12 (CYANOCOBALAMIN) 1000 MCG tablet Take 1,000 mcg by mouth daily.     No current facility-administered medications for this visit.   Facility-Administered Medications Ordered in Other Visits  Medication Dose Route Frequency Provider Last Rate Last Admin  . sodium chloride flush (NS) 0.9 % injection 10 mL  10 mL Intracatheter PRN Orson Slick, MD   10 mL at 07/04/20 1009    REVIEW OF SYSTEMS:   Constitutional: ( - ) fevers, ( - )  chills , ( - ) night sweats Eyes: ( - ) blurriness of vision, ( - ) double vision, ( - ) watery eyes Ears, nose, mouth, throat, and face: ( - ) mucositis, ( - ) sore throat Respiratory: ( - ) cough, ( - ) dyspnea, ( - ) wheezes Cardiovascular: ( - ) palpitation, ( - ) chest discomfort, ( - ) lower extremity swelling Gastrointestinal:  ( - ) nausea, ( - ) heartburn, ( - ) change in bowel habits Skin: ( - ) abnormal skin rashes Lymphatics: ( - ) new lymphadenopathy, ( - ) easy bruising Neurological: ( - ) numbness, ( - ) tingling, ( - ) new  weaknesses Behavioral/Psych: ( - ) mood change, ( - ) new changes  All other systems were reviewed with the patient and are negative.  PHYSICAL EXAMINATION: ECOG PERFORMANCE STATUS: 1 - Symptomatic but completely ambulatory  Vitals:   07/04/20 1033  BP: (!) 157/81  Pulse: 78  Resp: 17  Temp: (!) 97 F (36.1 C)  SpO2: 100%   Filed Weights   07/04/20 1033  Weight: 121 lb 12.8 oz (55.2 kg)    GENERAL: well appearing elderly Caucasian female in NAD  SKIN: skin color, texture, turgor are normal. Small dime sized dry skin lesions on chest, about 5 in number. Improved from prior EYES: conjunctiva are pink and non-injected, sclera clear NECK: supple, non-tender.  LUNGS: clear to auscultation and percussion with normal breathing effort HEART: regular rate & rhythm and no murmurs and no lower extremity edema Musculoskeletal: no cyanosis of digits and no clubbing  PSYCH: alert & oriented x 3, fluent speech NEURO: no focal motor/sensory deficits  LABORATORY DATA:  I have reviewed the data as listed CBC Latest Ref Rng & Units 07/04/2020 06/14/2020 06/01/2020  WBC 4.0 - 10.5 K/uL 7.4 4.0 4.2  Hemoglobin 12.0 - 15.0 g/dL 12.3 11.9(L) 11.3(L)  Hematocrit 36 - 46 % 35.2(L) 35.1(L) 33.2(L)  Platelets 150 - 400 K/uL 147(L) 148(L) 100(L)    CMP Latest Ref Rng & Units 07/04/2020 06/14/2020 06/01/2020  Glucose 70 - 99 mg/dL 99 109(H) 97  BUN 8 - 23 mg/dL 19 11 13   Creatinine 0.44 - 1.00 mg/dL 1.31(H) 1.20(H) 1.29(H)  Sodium 135 - 145 mmol/L 139 139 137  Potassium 3.5 - 5.1 mmol/L 3.6 3.6 3.9  Chloride 98 - 111 mmol/L 108 108 105  CO2 22 - 32 mmol/L 22 21(L) 23  Calcium  8.9 - 10.3 mg/dL 9.4 9.3 9.3  Total Protein 6.5 - 8.1 g/dL 6.4(L) 6.0(L) 6.0(L)  Total Bilirubin 0.3 - 1.2 mg/dL 0.3 0.4 0.5  Alkaline Phos 38 - 126 U/L 51 61 56  AST 15 - 41 U/L 15 19 19   ALT 0 - 44 U/L 7 15 11     RADIOGRAPHIC STUDIES: I have personally reviewed the radiological images as listed and agreed with the  findings in the report: FDG avid spleen and axillary lymph nodes. Involvement of cervical and inguinal nodes as well.  No results found.  ASSESSMENT & PLAN Elizabeth Mcintyre 79 y.o. 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  On exam today Elizabeth Mcintyre needs to improve from prior.  Her appetite has been good and her weight has been stable.  Her hemoglobin and white blood cell count have both rebounded to normal levels.  Overall at this point she is willing to try and proceed with rituximab monotherapy.  For her current diagonsis I recommend a rituximab monotherapy given her intolerance of BR x 2 cycles. This regimen would consist of rituximab 375 mg/m2 IV q weekly x 4 weeks. During her scans on 8/16 - 05/03/2020 she was shown to have marked response to therapy with normalization of spleen size and resolution of lymphadenopathy.   GELF Criteria: 1 (splenomegaly). Indication for treatment  # Low Grade Marginal Zone Lymphoma, Stage III --plan to discontinue bendamustine + rituximab as patient is unable to tolerate this treatment. Will pursue rituximab monotherapy instead. Started therapy with Cycle 1 Day 1 on 03/28/2020 BR, plan to decrease to Ritux alone q weekly x 4 starting 07/06/2020. --patient completed excisional lymph node biopsy to confirm the diagnosis. Pathological exam of the lymph node confirms marginal zone lymphoma.  --PET CT scan confirms stage III disease. Patient meets GELF criteria for treatment (splenomegaly). She did not have any B symptoms and her counts were stable at diagnosis, with some mild thrombocytopenia.  --patient completed port placement and chemotherapy education. --RTC after 2 doses of rituximab  #Concern for GI Bleed, resolved #Hematemesis, resolved #Colitis, resolved --patient admitted on 03/30/2020 (day after Cycle 1 chemotherapy) with hematemesis --no overt GI bleeding noted on Upper  GI evaluation -- no signs of bleeding since last visit.  --iron levels appeared low previously and patient was having symptoms of iron deficiency anemia including fatigue, restless leg, and dyspnea on exertion. -- Repeat labs today, Hgb continues rebounding nicely.   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   No orders of the defined types were placed in this encounter.   All questions were answered. The patient knows to call the clinic with any problems, questions or concerns.  A total of more than 30 minutes were spent on this encounter and over half of that time was spent on counseling and coordination of care as outlined above.   Ledell Peoples, MD Department of Hematology/Oncology [place] [facility] Phone: [phone] Pager: [phone] Email: [email or web address]  07/04/2020 1:43 PM

## 2020-07-04 ENCOUNTER — Inpatient Hospital Stay (HOSPITAL_BASED_OUTPATIENT_CLINIC_OR_DEPARTMENT_OTHER): Payer: Medicare Other

## 2020-07-04 ENCOUNTER — Inpatient Hospital Stay: Payer: Medicare Other | Attending: Hematology and Oncology | Admitting: Hematology and Oncology

## 2020-07-04 ENCOUNTER — Inpatient Hospital Stay: Payer: Medicare Other

## 2020-07-04 ENCOUNTER — Other Ambulatory Visit: Payer: Self-pay

## 2020-07-04 ENCOUNTER — Encounter: Payer: Self-pay | Admitting: Hematology and Oncology

## 2020-07-04 VITALS — BP 157/81 | HR 78 | Temp 97.0°F | Resp 17 | Ht 64.0 in | Wt 121.8 lb

## 2020-07-04 DIAGNOSIS — C8514 Unspecified B-cell lymphoma, lymph nodes of axilla and upper limb: Secondary | ICD-10-CM | POA: Diagnosis not present

## 2020-07-04 DIAGNOSIS — C8580 Other specified types of non-Hodgkin lymphoma, unspecified site: Secondary | ICD-10-CM

## 2020-07-04 DIAGNOSIS — Z5112 Encounter for antineoplastic immunotherapy: Secondary | ICD-10-CM | POA: Diagnosis not present

## 2020-07-04 DIAGNOSIS — R42 Dizziness and giddiness: Secondary | ICD-10-CM | POA: Diagnosis not present

## 2020-07-04 DIAGNOSIS — R030 Elevated blood-pressure reading, without diagnosis of hypertension: Secondary | ICD-10-CM | POA: Diagnosis not present

## 2020-07-04 DIAGNOSIS — Z95828 Presence of other vascular implants and grafts: Secondary | ICD-10-CM

## 2020-07-04 LAB — BASIC METABOLIC PANEL
BUN: 19 (ref 4–21)
CO2: 22 (ref 13–22)
Chloride: 108 (ref 99–108)
Creatinine: 1.3 — AB (ref 0.5–1.1)
Glucose: 99
Potassium: 3.6 (ref 3.4–5.3)
Sodium: 139 (ref 137–147)

## 2020-07-04 LAB — CBC WITH DIFFERENTIAL (CANCER CENTER ONLY)
Abs Immature Granulocytes: 0.05 10*3/uL (ref 0.00–0.07)
Basophils Absolute: 0.1 10*3/uL (ref 0.0–0.1)
Basophils Relative: 1 %
Eosinophils Absolute: 0.3 10*3/uL (ref 0.0–0.5)
Eosinophils Relative: 4 %
HCT: 35.2 % — ABNORMAL LOW (ref 36.0–46.0)
Hemoglobin: 12.3 g/dL (ref 12.0–15.0)
Immature Granulocytes: 1 %
Lymphocytes Relative: 25 %
Lymphs Abs: 1.9 10*3/uL (ref 0.7–4.0)
MCH: 30.4 pg (ref 26.0–34.0)
MCHC: 34.9 g/dL (ref 30.0–36.0)
MCV: 86.9 fL (ref 80.0–100.0)
Monocytes Absolute: 0.6 10*3/uL (ref 0.1–1.0)
Monocytes Relative: 8 %
Neutro Abs: 4.6 10*3/uL (ref 1.7–7.7)
Neutrophils Relative %: 61 %
Platelet Count: 147 10*3/uL — ABNORMAL LOW (ref 150–400)
RBC: 4.05 MIL/uL (ref 3.87–5.11)
RDW: 16.1 % — ABNORMAL HIGH (ref 11.5–15.5)
WBC Count: 7.4 10*3/uL (ref 4.0–10.5)
nRBC: 0 % (ref 0.0–0.2)

## 2020-07-04 LAB — CMP (CANCER CENTER ONLY)
ALT: 7 U/L (ref 0–44)
AST: 15 U/L (ref 15–41)
Albumin: 3.7 g/dL (ref 3.5–5.0)
Alkaline Phosphatase: 51 U/L (ref 38–126)
Anion gap: 9 (ref 5–15)
BUN: 19 mg/dL (ref 8–23)
CO2: 22 mmol/L (ref 22–32)
Calcium: 9.4 mg/dL (ref 8.9–10.3)
Chloride: 108 mmol/L (ref 98–111)
Creatinine: 1.31 mg/dL — ABNORMAL HIGH (ref 0.44–1.00)
GFR, Estimated: 39 mL/min — ABNORMAL LOW (ref >60)
Glucose, Bld: 99 mg/dL (ref 70–99)
Potassium: 3.6 mmol/L (ref 3.5–5.1)
Sodium: 139 mmol/L (ref 135–145)
Total Bilirubin: 0.3 mg/dL (ref 0.3–1.2)
Total Protein: 6.4 g/dL — ABNORMAL LOW (ref 6.5–8.1)

## 2020-07-04 LAB — HEPATIC FUNCTION PANEL
ALT: 7 (ref 7–35)
AST: 15 (ref 13–35)
Alkaline Phosphatase: 51 (ref 25–125)
Bilirubin, Total: 0.3

## 2020-07-04 LAB — IRON,TIBC AND FERRITIN PANEL
Ferritin: 2045
Iron: 130
TIBC: 221

## 2020-07-04 LAB — CBC AND DIFFERENTIAL
HCT: 35 — AB (ref 36–46)
Hemoglobin: 12.3 (ref 12.0–16.0)
Platelets: 147 — AB (ref 150–399)
WBC: 7.4

## 2020-07-04 LAB — LACTATE DEHYDROGENASE: LDH: 191 U/L (ref 98–192)

## 2020-07-04 LAB — COMPREHENSIVE METABOLIC PANEL: Calcium: 9.4 (ref 8.7–10.7)

## 2020-07-04 MED ORDER — HEPARIN SOD (PORK) LOCK FLUSH 100 UNIT/ML IV SOLN
500.0000 [IU] | Freq: Once | INTRAVENOUS | Status: AC | PRN
  Administered 2020-07-04: 500 [IU]
  Filled 2020-07-04: qty 5

## 2020-07-04 MED ORDER — SODIUM CHLORIDE 0.9% FLUSH
10.0000 mL | INTRAVENOUS | Status: DC | PRN
  Administered 2020-07-04: 10 mL
  Filled 2020-07-04: qty 10

## 2020-07-04 NOTE — Patient Instructions (Signed)

## 2020-07-06 ENCOUNTER — Other Ambulatory Visit: Payer: Medicare Other

## 2020-07-06 ENCOUNTER — Other Ambulatory Visit: Payer: Self-pay

## 2020-07-06 ENCOUNTER — Inpatient Hospital Stay: Payer: Medicare Other

## 2020-07-06 ENCOUNTER — Ambulatory Visit (HOSPITAL_BASED_OUTPATIENT_CLINIC_OR_DEPARTMENT_OTHER): Payer: Medicare Other | Admitting: Medical

## 2020-07-06 VITALS — BP 162/98 | HR 75 | Temp 97.7°F | Resp 16

## 2020-07-06 DIAGNOSIS — T8090XA Unspecified complication following infusion and therapeutic injection, initial encounter: Secondary | ICD-10-CM

## 2020-07-06 DIAGNOSIS — Z5112 Encounter for antineoplastic immunotherapy: Secondary | ICD-10-CM | POA: Diagnosis not present

## 2020-07-06 DIAGNOSIS — R42 Dizziness and giddiness: Secondary | ICD-10-CM | POA: Diagnosis not present

## 2020-07-06 DIAGNOSIS — R030 Elevated blood-pressure reading, without diagnosis of hypertension: Secondary | ICD-10-CM | POA: Diagnosis not present

## 2020-07-06 DIAGNOSIS — C8514 Unspecified B-cell lymphoma, lymph nodes of axilla and upper limb: Secondary | ICD-10-CM | POA: Diagnosis not present

## 2020-07-06 DIAGNOSIS — C8580 Other specified types of non-Hodgkin lymphoma, unspecified site: Secondary | ICD-10-CM

## 2020-07-06 MED ORDER — SODIUM CHLORIDE 0.9% FLUSH
10.0000 mL | INTRAVENOUS | Status: DC | PRN
  Administered 2020-07-06: 10 mL
  Filled 2020-07-06: qty 10

## 2020-07-06 MED ORDER — SODIUM CHLORIDE 0.9 % IV SOLN
375.0000 mg/m2 | Freq: Once | INTRAVENOUS | Status: AC
  Administered 2020-07-06: 600 mg via INTRAVENOUS
  Filled 2020-07-06: qty 50

## 2020-07-06 MED ORDER — SODIUM CHLORIDE 0.9 % IV SOLN
Freq: Once | INTRAVENOUS | Status: AC
  Filled 2020-07-06: qty 250

## 2020-07-06 MED ORDER — HEPARIN SOD (PORK) LOCK FLUSH 100 UNIT/ML IV SOLN
500.0000 [IU] | Freq: Once | INTRAVENOUS | Status: AC | PRN
  Administered 2020-07-06: 500 [IU]
  Filled 2020-07-06: qty 5

## 2020-07-06 MED ORDER — ACETAMINOPHEN 325 MG PO TABS
ORAL_TABLET | ORAL | Status: AC
  Filled 2020-07-06: qty 2

## 2020-07-06 MED ORDER — FAMOTIDINE IN NACL 20-0.9 MG/50ML-% IV SOLN
20.0000 mg | Freq: Once | INTRAVENOUS | Status: AC | PRN
  Administered 2020-07-06: 20 mg via INTRAVENOUS

## 2020-07-06 MED ORDER — DIPHENHYDRAMINE HCL 25 MG PO CAPS
ORAL_CAPSULE | ORAL | Status: AC
  Filled 2020-07-06: qty 2

## 2020-07-06 MED ORDER — DIPHENHYDRAMINE HCL 25 MG PO CAPS
50.0000 mg | ORAL_CAPSULE | Freq: Once | ORAL | Status: AC
  Administered 2020-07-06: 50 mg via ORAL

## 2020-07-06 MED ORDER — ACETAMINOPHEN 325 MG PO TABS
650.0000 mg | ORAL_TABLET | Freq: Once | ORAL | Status: AC
  Administered 2020-07-06: 650 mg via ORAL

## 2020-07-06 NOTE — Progress Notes (Signed)
During infusion patient c/o dizziness.   VS obtained per flowsheets, hypertension noted.  Infusion stopped, [employer] notified.    Verbal orders given for Pepcid 20mg  IVPB, administered per MAR.

## 2020-07-06 NOTE — Patient Instructions (Signed)
[facility] Discharge Instructions for Patients Receiving Chemotherapy  Today you received the following chemotherapy agents: rituximab.  To help prevent nausea and vomiting after your treatment, we encourage you to take your nausea medication as directed.   If you develop nausea and vomiting that is not controlled by your nausea medication, call the clinic.   BELOW ARE SYMPTOMS THAT SHOULD BE REPORTED IMMEDIATELY:  *FEVER GREATER THAN 100.5 F  *CHILLS WITH OR WITHOUT FEVER  NAUSEA AND VOMITING THAT IS NOT CONTROLLED WITH YOUR NAUSEA MEDICATION  *UNUSUAL SHORTNESS OF BREATH  *UNUSUAL BRUISING OR BLEEDING  TENDERNESS IN MOUTH AND THROAT WITH OR WITHOUT PRESENCE OF ULCERS  *URINARY PROBLEMS  *BOWEL PROBLEMS  UNUSUAL RASH Items with * indicate a potential emergency and should be followed up as soon as possible.  Feel free to call the clinic should you have any questions or concerns. The clinic phone number is [phone].  Please show the CHEMO ALERT CARD at check-in to the Emergency Department and triage nurse.   

## 2020-07-06 NOTE — Progress Notes (Signed)
Per Linna Caprice, after completion of Pepcid, wait for 10 minutes.    Patient reports, "I feel better." VS per flowsheets. Infusion restarted @ 12:16.    Will continue to monitor.

## 2020-07-10 NOTE — Progress Notes (Signed)
    DATE:  07/06/2020                                           X  CHEMO/IMMUNOTHERAPY REACTION            MD:  Dr. Narda Rutherford   AGENT/BLOOD PRODUCT RECEIVING TODAY:               Ruxience    AGENT/BLOOD PRODUCT RECEIVING IMMEDIATELY PRIOR TO REACTION:           Ruxience (second bump up)   VS: BP:      141/73   P:        64       SPO2:        100% on room air  T: 97.8                BP:      177/75   P:        69       SPO2:        100% on room air  T: 97.9     REACTION(S):           Dizziness and mildly elevated blood pressure   PREMEDS:      Tylenol and Benadryl   INTERVENTION: Ruxience was paused and the patient was given Pepcid 20 mg IV x1.   Review of Systems  Review of Systems  Constitutional: Negative for chills, diaphoresis and fever.  HENT: Negative for trouble swallowing and voice change.   Respiratory: Negative for cough, chest tightness, shortness of breath and wheezing.   Cardiovascular: Negative for chest pain and palpitations.  Gastrointestinal: Negative for abdominal pain, constipation, diarrhea, nausea and vomiting.  Musculoskeletal: Negative for back pain and myalgias.  Neurological: Positive for dizziness. Negative for light-headedness and headaches.     Physical Exam  Physical Exam Constitutional:      General: She is not in acute distress.    Appearance: She is not diaphoretic.  HENT:     Head: Normocephalic and atraumatic.  Cardiovascular:     Rate and Rhythm: Normal rate and regular rhythm.     Heart sounds: Normal heart sounds. No murmur heard.  No friction rub. No gallop.   Pulmonary:     Effort: Pulmonary effort is normal. No respiratory distress.     Breath sounds: Normal breath sounds. No wheezing or rales.  Skin:    General: Skin is warm and dry.     Findings: No erythema or rash.  Neurological:     Mental Status: She is alert.  Psychiatric:        Mood and Affect: Mood normal.        Behavior: Behavior normal.        Thought  Content: Thought content normal.        Judgment: Judgment normal.     OUTCOME:                 The patient's symptoms resolved after she was given Pepcid 20 mg IV.  Ruxience was restarted and completed without any additional issues of concern.   Sandi Mealy, MHS, PA-C

## 2020-07-11 ENCOUNTER — Ambulatory Visit: Payer: Medicare Other | Admitting: Gastroenterology

## 2020-07-11 ENCOUNTER — Other Ambulatory Visit: Payer: Self-pay

## 2020-07-11 ENCOUNTER — Encounter: Payer: Self-pay | Admitting: Gastroenterology

## 2020-07-11 VITALS — BP 156/91 | HR 94 | Temp 97.6°F | Ht 61.0 in | Wt 122.0 lb

## 2020-07-11 DIAGNOSIS — K2100 Gastro-esophageal reflux disease with esophagitis, without bleeding: Secondary | ICD-10-CM

## 2020-07-11 DIAGNOSIS — K222 Esophageal obstruction: Secondary | ICD-10-CM | POA: Diagnosis not present

## 2020-07-11 DIAGNOSIS — R1319 Other dysphagia: Secondary | ICD-10-CM

## 2020-07-11 MED ORDER — PANTOPRAZOLE SODIUM 40 MG PO TBEC: 40.0000 mg | DELAYED_RELEASE_TABLET | Freq: Two times a day (BID) | ORAL | 3 refills | Status: DC

## 2020-07-11 NOTE — Progress Notes (Signed)
Primary Care Physician: Ma Hillock, DO  Primary Gastroenterologist:  Elon Alas. Abbey Chatters, DO   Chief Complaint  Patient presents with  . Dysphagia    tongues/throat feels raw/sore x 1 month. Taking carafate once a day for symptoms    HPI: Elizabeth Mcintyre is a 79 y.o. female here for follow-up.  Last seen in August during hospitalization for rectal bleeding.  History of diarrhea, incontinence, dysphagia.  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   Patient was seen back in the office in August for follow-up of EGD performed in July by Dr. Delma Officer during hospitalization.  EGD showed moderately severe erosive, circumferential, confluent esophagitis with no active bleeding noted 25 to 40 cm from the incisors.  Nonobstructing and mild Schatzki ring, there were multiple distal esophageal rings noted, minimal hiatal hernia.  Biopsies consistent with severe reflux versus drug/pill effect versus infection.  Viral cytopathic changes not seen.   Barium pill esophagram performed August 9: Widely patent distal esophageal mucosal ring, just above this in the distal esophagus is a smoothly marginated focal narrowing/stricture measuring 0.4 cm in length.  13 mm barium tablet passed without difficulty.  Small type I hiatal hernia.  She was scheduled for repeat EGD with dilation but prior to having this done she ended up in the hospital with rectal bleeding. Patient hospitalized in August with ischemic colitis based on colon biopsies.  CTA showed no significant proximal mesenteric arterial occlusive disease to suggest etiology of mesenteric ischemia.  She did have sigmoid colon wall thickening which was seen on colonoscopy. Colon biopsies c/w ischemia. GI pathogen panel was negative. Treated with course of cipro/flagyl.  Patient states she continues to have significant mouth symptoms.  Her tongue and the back of the throat burns and feels irritated.  Seems to be  worse at night.  She had some symptoms prior to starting chemo.  States she was told it was all related to her reflux.  Currently taking pantoprazole 40 mg daily, Carafate once daily.  No longer on Magic mouthwash which was provided to her after she started chemo and developed ulcers in her mouth.  Daughter notes that she was having issues getting her to eat especially in the setting of poor tolerance to chemotherapy.  She has been craving tomato soup so they were letting her happen frequently.  Also consumes significant amounts of ice cream.  Cut back on her dark colas.  Feels like her pills get stuck in the back of her throat and hangs up in the neck area.  Washes of down with fluid.  Same thing happens with foods.  Complains of regurgitation throughout the day and at nighttime.  No vomiting.  Most of her burning seems to be in the upper chest throat area.  Bowel movements are good at this point.  1-2 stools per day.  No blood in the stool or melena.  Currently taking Bentyl twice daily and daughter questions whether she can stop this because it may be contributing to dry mouth etc.  Since cancer diagnosis she is lost a total of 30 pounds.  Started to gain some of it back.  Poor tolerance to chemo, received 2 of 8 cycles of ndamustine+rituximab.  According to daughter, plans to complete immunotherapy only, has 3 infusions of rituximab.  Daughter also reports that the oncologist approved for [place] to proceed with EGD at this time.  Previously canceled earlier this month per their request.  Previous imaging CT angio chest back in July with normal-appearing esophagus.  CT abdomen pelvis with spleen enlarging measuring 11 x 5.6 x 16.4 cm.  CT abdomen pelvis without contrast August 16 for rectal bleeding: Moderate severity thickening of the mid sigmoid colon with mild amount of pericolonic inflammatory fat stranding.  While this may represent sequelae associated with an acute infectious or inflammatory colitis,  presence of underlying neoplastic process cannot be excluded.  CTA abdomen and pelvis August 19: Celiac with calcified ostial plaque resulting in only mild stenosis, patent distantly.  SMA with calcified ostial plaque resulting in only mild stenosis, patent distantly.  IMA patent.  Spleen normal in size.  Circumferential wall thickening and mild enhancement in the mid sigmoid colon suggesting colitis.  Bilateral renal artery ostial stenoses.  Current Outpatient Medications  Medication Sig Dispense Refill  . acetaminophen (TYLENOL) 325 MG tablet Take 650 mg by mouth every 6 (six) hours as needed for moderate pain.     Marland Kitchen albuterol (VENTOLIN HFA) 108 (90 Base) MCG/ACT inhaler Inhale 2 puffs into the lungs every 6 (six) hours as needed for wheezing. 8.5 g 2  . allopurinol (ZYLOPRIM) 300 MG tablet Take 1 tablet (300 mg total) by mouth daily. 30 tablet 3  . Cholecalciferol (VITAMIN D3) 25 MCG (1000 UT) CAPS Take 1,000 Units by mouth daily.     Marland Kitchen levothyroxine (SYNTHROID) 75 MCG tablet Take 1 tablet (75 mcg total) by mouth daily. 90 tablet 3  . lidocaine-prilocaine (EMLA) cream Apply 1 application topically as needed. 30 g 1  . loperamide (IMODIUM) 2 MG capsule Take 1 capsule (2 mg total) by mouth every 6 (six) hours as needed for diarrhea or loose stools. 30 capsule 0  . magic mouthwash w/lidocaine SOLN Take 5 mLs by mouth 4 (four) times daily as needed for mouth pain. 260 mL 1  . pantoprazole (PROTONIX) 40 MG tablet Take 1 tablet (40 mg total) by mouth 2 (two) times daily. After 8 weeks take only once daily. (Patient taking differently: Take 40 mg by mouth daily. After 8 weeks take only once daily.) 60 tablet 1  . rOPINIRole (REQUIP) 0.25 MG tablet Take 1 tablet (0.25 mg total) by mouth at bedtime. 90 tablet 1  . sucralfate (CARAFATE) 1 GM/10ML suspension Take 10 mLs (1 g total) by mouth 4 (four) times daily as needed (for throat/mouth burning). 420 mL 1  . vitamin B-12 (CYANOCOBALAMIN) 1000 MCG tablet  Take 1,000 mcg by mouth every other day.      No current facility-administered medications for this visit.    Allergies as of 07/11/2020 - Review Complete 07/11/2020  Allergen Reaction Noted  . Codeine phosphate Nausea And Vomiting and Rash    Past Medical History:  Diagnosis Date  . Asthma   . Bloating 04/26/2019  . Cancer ([place]) 2021   Lymphoma  . 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 DIABETES MELLITUS, TYPE II 11/09/2007   diet control  . Diverticulosis 03/2011  . GERD (gastroesophageal reflux disease)    none recently  . GI bleed   . History of rheumatoid arthritis    during 30's, was treated.  . Hyperlipidemia   . Hypertension   . IBS (irritable bowel syndrome)   . Osteopenia 2017   Last  bone density 05/04/2017: -2.4  . PONV (postoperative nausea and vomiting)   . Stress incontinence   . Stroke [facility])    mini stroke -  found on a CT scan   Past Surgical History:  Procedure Laterality Date  . ABDOMINAL HYSTERECTOMY    . BIOPSY  03/30/2020   Procedure: BIOPSY;  Surgeon: Ronald Lobo, MD;  Location: [facility];  Service: Endoscopy;;  . BIOPSY  05/02/2020   Procedure: BIOPSY;  Surgeon: Montez Morita, Quillian Quince, MD;  Location: [facility];  Service: Gastroenterology;;  . CARDIAC CATHETERIZATION     X 2, last one in 1998  . CHOLECYSTECTOMY N/A 04/13/2017   Procedure: LAPAROSCOPIC CHOLECYSTECTOMY;  Surgeon: Aviva Signs, MD;  Location: [facility];  Service: General;  Laterality: N/A;  . COLONOSCOPY    . COLONOSCOPY  May 2012   Dr. Olevia Perches: mild diverticulosis, otherwise normal.   . COLONOSCOPY WITH PROPOFOL N/A 05/02/2020   Dr. Jenetta Downer: 8 mm polyp removed from the ascending colon, 2 mm polyp removed from the ascending colon.  Tubular adenomas.  Diverticulosis.  Mucosal ulceration in the sigmoid colon noted, pathology consistent with ischemic colitis.  Marland Kitchen ESOPHAGOGASTRODUODENOSCOPY N/A 01/28/2015   Dr. Gala Romney: reflux esophagitis,  Schatzki's ring not manipulated due to recent bleeding  . ESOPHAGOGASTRODUODENOSCOPY N/A 03/30/2015   Dr. Gala Romney: Schatzki's ring s/p Venia Minks dilation, previously noted esophageal ulcer completely healed  . ESOPHAGOGASTRODUODENOSCOPY N/A 03/30/2020   Buccini: Moderately severe erosive, circumferential, confluent esophagitis with no bleeding found 25 to 40 cm from incisors.  Nonobstructing and mild Schatzki ring, there were also multiple distal esophageal rings noted, minimal hiatal hernia.  . IR IMAGING GUIDED PORT INSERTION  03/13/2020  . LYMPH NODE BIOPSY Left 03/20/2020   Procedure: LEFT POSTERIOR CERVICAL LYMPH NODE BIOPSY;  Surgeon: Georganna Skeans, MD;  Location: [place];  Service: General;  Laterality: Left;  Marland Kitchen MALONEY DILATION N/A 03/30/2015   Procedure: Venia Minks DILATION;  Surgeon: Daneil Dolin, MD;  [facility];  Service: Endoscopy;  Laterality: N/A;  . POLYPECTOMY  05/02/2020   Procedure: POLYPECTOMY;  Surgeon: Harvel Quale, MD;  [facility];  Service: Gastroenterology;;   Family History  Problem Relation Age of Onset  . Heart disease Mother   . Osteoarthritis Mother   . Sudden death Father   . Single kidney Father   . Other Father        h/o severe MVA injuries  . Hyperlipidemia Sister   . Other Daughter        Myalgias  . Fibromyalgia Daughter   . Allergies Daughter   . Heart disease Maternal Grandfather   . Sudden death Paternal Grandmother   . Diabetes Paternal Grandfather   . Heart disease Daughter   . Other Daughter        palpitations  . Pulmonary fibrosis Maternal Aunt   . Cancer Paternal Uncle   . Pulmonary fibrosis Maternal Aunt   . Colon cancer Neg Hx    Social History   Tobacco Use  . Smoking status: Never Smoker  . Smokeless tobacco: Never Used  Vaping Use  . Vaping Use: Never used  Substance Use Topics  . Alcohol use: No  . Drug use: No    ROS:  General: Negative for fever, chills, fatigue.  See HPI.  Weakness  improved.  Appetite returning.  Reports 30 pound weight loss but has gained some back.  She weighed 140 pounds back in July and down to 122 down.   ENT: Negative for hoarseness,   , nasal congestion.  See HPI CV: Negative for chest pain, angina, palpitations, dyspnea on exertion, peripheral edema.  Respiratory: Negative for dyspnea at  rest, dyspnea on exertion, cough, sputum, wheezing.  GI: See history of present illness. GU:  Negative for dysuria, hematuria, urinary incontinence, urinary frequency, nocturnal urination.  Endo: See HPI   Physical Examination:   BP (!) 156/91   Pulse 94   Temp 97.6 F (36.4 C)   Ht 5\' 1"  (1.549 m)   Wt 122 lb (55.3 kg)   BMI 23.05 kg/m   General: Well-nourished, well-developed in no acute distress.  Accompanied by daughter Eyes: No icterus. Mouth: Oropharyngeal mucosa moist and pink , no erythema or exudate.  Small white area in the posterior left pharynx possibly healing ulceration  lungs: Clear to auscultation bilaterally.  Heart: Regular rate and rhythm, no murmurs rubs or gallops.  Abdomen: Bowel sounds are normal, nontender, nondistended, no hepatosplenomegaly or masses, no abdominal bruits or hernia , no rebound or guarding.   Extremities: No lower extremity edema. No clubbing or deformities. Neuro: Alert and oriented x 4   Skin: Warm and dry, no jaundice.   Psych: Alert and cooperative, normal mood and affect.  Labs:  Lab Results  Component Value Date   CREATININE 1.31 (H) 07/04/2020   BUN 19 07/04/2020   NA 139 07/04/2020   K 3.6 07/04/2020   CL 108 07/04/2020   CO2 22 07/04/2020   Lab Results  Component Value Date   WBC 7.4 07/04/2020   HGB 12.3 07/04/2020   HCT 35.2 (L) 07/04/2020   MCV 86.9 07/04/2020   PLT 147 (L) 07/04/2020   Lab Results  Component Value Date   ALT 7 07/04/2020   AST 15 07/04/2020   ALKPHOS 51 07/04/2020   BILITOT 0.3 07/04/2020     Lab Results  Component Value Date   IRON 130 06/01/2020   TIBC  221 (L) 06/01/2020   FERRITIN 2,045 (H) 06/01/2020    Imaging Studies: No results found.   Impression/plan:  79 year old female with history of marginal zone lymphoma, poor tolerance to chemotherapy but completing immunotherapy, presenting for ongoing symptoms of mouth/tongue burning and erosive reflux esophagitis, dysphagia.  She was scheduled to undergo EGD with esophageal dilation earlier this month but due to poor tolerance to chemotherapy this was postponed.  Patient's daughter reports that her oncologist has approved for her to pursue EGD at this time.  She was in the hospital back in July with hematemesis after first round of chemo.  Markedly abnormal esophagus as outlined above with severe esophagitis and multiple esophageal rings.  Subsequent barium pill esophagram showing Schatzki ring with narrowing/stricturing although barium tablet passed without difficulty.  Esophagus could not be expanded more than 1 cm in this area.  Continues to have significant reflux symptoms, particularly at night.  Regurgitation.  Dysphagia to solid foods and some of her pills.  Would recommend eating with esophageal dilation in the near future with Dr. Abbey Chatters.   ASA III.  I have discussed the risks, alternatives, benefits with regards to but not limited to the risk of reaction to medication, bleeding, infection, perforation and the patient is agreeable to proceed. Written consent to be obtained.  We will have her increase pantoprazole to 40 mg twice daily before meals.  She can take Carafate 10 cc up to 4 times daily for relief of symptoms.  Can add back Magic mouthwash with instructions to swish, gargle, spit up to 4 times daily for symptom relief.  Encourage patient to avoid acidic foods.  Because of poor appetite and weight loss, family has encouraged intake of any foods that  patient has been able to tolerate.  We will also have her stop dicyclomine as this may be contributing to dryness in the mouth.  If  recurrent diarrhea she can resume however.  Possible healing ulceration in the left posterior pharynx seen on exam today.  Examined at time of upper endoscopy.

## 2020-07-11 NOTE — Patient Instructions (Signed)
1. Upper endoscopy as scheduled. See separate instructions.  2. Increase pantoprazole to 40mg  twice daily before breakfast and evening meal. RX sent to your pharmacy. 3. Increase Carafate to 63mLs 3-4 times daily to help with burning of mouth and throat.  4. You can use the magic mouthwash/lidocaine solution, swish/gargle/spit to help with throat burning (28mLs four times daily).  5. Stop bentyl for now as this may aggravate your dry mouth and reflux. If you have recurrent diarrhea, please start back on bentyl.

## 2020-07-11 NOTE — Patient Instructions (Signed)
PA for EGD/DIL submitted via [facility] website. PA# [id number], valid 08/21/20-09/14/20.

## 2020-07-13 ENCOUNTER — Ambulatory Visit: Payer: Medicare Other | Admitting: Hematology and Oncology

## 2020-07-13 ENCOUNTER — Other Ambulatory Visit: Payer: Self-pay

## 2020-07-13 ENCOUNTER — Other Ambulatory Visit: Payer: Self-pay | Admitting: *Deleted

## 2020-07-13 ENCOUNTER — Other Ambulatory Visit: Payer: Self-pay | Admitting: Hematology and Oncology

## 2020-07-13 ENCOUNTER — Inpatient Hospital Stay: Payer: Medicare Other

## 2020-07-13 VITALS — BP 182/92 | HR 75 | Temp 97.9°F | Resp 18 | Wt 121.8 lb

## 2020-07-13 DIAGNOSIS — C8514 Unspecified B-cell lymphoma, lymph nodes of axilla and upper limb: Secondary | ICD-10-CM | POA: Diagnosis not present

## 2020-07-13 DIAGNOSIS — C8580 Other specified types of non-Hodgkin lymphoma, unspecified site: Secondary | ICD-10-CM

## 2020-07-13 DIAGNOSIS — Z5112 Encounter for antineoplastic immunotherapy: Secondary | ICD-10-CM | POA: Diagnosis not present

## 2020-07-13 DIAGNOSIS — R42 Dizziness and giddiness: Secondary | ICD-10-CM | POA: Diagnosis not present

## 2020-07-13 DIAGNOSIS — E876 Hypokalemia: Secondary | ICD-10-CM

## 2020-07-13 DIAGNOSIS — Z95828 Presence of other vascular implants and grafts: Secondary | ICD-10-CM

## 2020-07-13 DIAGNOSIS — R030 Elevated blood-pressure reading, without diagnosis of hypertension: Secondary | ICD-10-CM | POA: Diagnosis not present

## 2020-07-13 LAB — CBC WITH DIFFERENTIAL (CANCER CENTER ONLY)
Abs Immature Granulocytes: 0.03 10*3/uL (ref 0.00–0.07)
Basophils Absolute: 0 10*3/uL (ref 0.0–0.1)
Basophils Relative: 1 %
Eosinophils Absolute: 0.3 10*3/uL (ref 0.0–0.5)
Eosinophils Relative: 6 %
HCT: 34.6 % — ABNORMAL LOW (ref 36.0–46.0)
Hemoglobin: 12.3 g/dL (ref 12.0–15.0)
Immature Granulocytes: 1 %
Lymphocytes Relative: 25 %
Lymphs Abs: 1.3 10*3/uL (ref 0.7–4.0)
MCH: 30.8 pg (ref 26.0–34.0)
MCHC: 35.5 g/dL (ref 30.0–36.0)
MCV: 86.7 fL (ref 80.0–100.0)
Monocytes Absolute: 0.5 10*3/uL (ref 0.1–1.0)
Monocytes Relative: 9 %
Neutro Abs: 3.2 10*3/uL (ref 1.7–7.7)
Neutrophils Relative %: 58 %
Platelet Count: 144 10*3/uL — ABNORMAL LOW (ref 150–400)
RBC: 3.99 MIL/uL (ref 3.87–5.11)
RDW: 15.6 % — ABNORMAL HIGH (ref 11.5–15.5)
WBC Count: 5.5 10*3/uL (ref 4.0–10.5)
nRBC: 0 % (ref 0.0–0.2)

## 2020-07-13 LAB — CMP (CANCER CENTER ONLY)
ALT: 10 U/L (ref 0–44)
AST: 12 U/L — ABNORMAL LOW (ref 15–41)
Albumin: 3.9 g/dL (ref 3.5–5.0)
Alkaline Phosphatase: 51 U/L (ref 38–126)
Anion gap: 10 (ref 5–15)
BUN: 22 mg/dL (ref 8–23)
CO2: 22 mmol/L (ref 22–32)
Calcium: 10 mg/dL (ref 8.9–10.3)
Chloride: 106 mmol/L (ref 98–111)
Creatinine: 1.15 mg/dL — ABNORMAL HIGH (ref 0.44–1.00)
GFR, Estimated: 49 mL/min — ABNORMAL LOW (ref >60)
Glucose, Bld: 118 mg/dL — ABNORMAL HIGH (ref 70–99)
Potassium: 3.3 mmol/L — ABNORMAL LOW (ref 3.5–5.1)
Sodium: 138 mmol/L (ref 135–145)
Total Bilirubin: 0.4 mg/dL (ref 0.3–1.2)
Total Protein: 6.5 g/dL (ref 6.5–8.1)

## 2020-07-13 LAB — LACTATE DEHYDROGENASE: LDH: 171 U/L (ref 98–192)

## 2020-07-13 MED ORDER — POTASSIUM CHLORIDE CRYS ER 20 MEQ PO TBCR: 40.0000 meq | EXTENDED_RELEASE_TABLET | Freq: Once | ORAL | Status: DC

## 2020-07-13 MED ORDER — ACETAMINOPHEN 325 MG PO TABS
650.0000 mg | ORAL_TABLET | Freq: Once | ORAL | Status: AC
  Administered 2020-07-13: 650 mg via ORAL

## 2020-07-13 MED ORDER — ACETAMINOPHEN 325 MG PO TABS
ORAL_TABLET | ORAL | Status: AC
  Filled 2020-07-13: qty 2

## 2020-07-13 MED ORDER — POTASSIUM CHLORIDE CRYS ER 20 MEQ PO TBCR
EXTENDED_RELEASE_TABLET | ORAL | Status: AC
  Filled 2020-07-13: qty 2

## 2020-07-13 MED ORDER — HEPARIN SOD (PORK) LOCK FLUSH 100 UNIT/ML IV SOLN
500.0000 [IU] | Freq: Once | INTRAVENOUS | Status: AC | PRN
  Administered 2020-07-13: 500 [IU]
  Filled 2020-07-13: qty 5

## 2020-07-13 MED ORDER — DIPHENHYDRAMINE HCL 25 MG PO CAPS
50.0000 mg | ORAL_CAPSULE | Freq: Once | ORAL | Status: AC
  Administered 2020-07-13: 50 mg via ORAL

## 2020-07-13 MED ORDER — FAMOTIDINE IN NACL 20-0.9 MG/50ML-% IV SOLN
20.0000 mg | Freq: Once | INTRAVENOUS | Status: AC
  Administered 2020-07-13: 20 mg via INTRAVENOUS

## 2020-07-13 MED ORDER — DIPHENHYDRAMINE HCL 25 MG PO CAPS
ORAL_CAPSULE | ORAL | Status: AC
  Filled 2020-07-13: qty 2

## 2020-07-13 MED ORDER — SODIUM CHLORIDE 0.9% FLUSH
10.0000 mL | INTRAVENOUS | Status: DC | PRN
  Administered 2020-07-13: 10 mL
  Filled 2020-07-13: qty 10

## 2020-07-13 MED ORDER — SODIUM CHLORIDE 0.9 % IV SOLN
Freq: Once | INTRAVENOUS | Status: AC
  Filled 2020-07-13: qty 250

## 2020-07-13 MED ORDER — SODIUM CHLORIDE 0.9 % IV SOLN
375.0000 mg/m2 | Freq: Once | INTRAVENOUS | Status: AC
  Administered 2020-07-13: 600 mg via INTRAVENOUS
  Filled 2020-07-13: qty 50

## 2020-07-13 MED ORDER — CLONIDINE HCL 0.1 MG PO TABS
0.1000 mg | ORAL_TABLET | Freq: Once | ORAL | Status: AC
  Administered 2020-07-13: 0.1 mg via ORAL

## 2020-07-13 MED ORDER — POTASSIUM CHLORIDE CRYS ER 20 MEQ PO TBCR: 20.0000 meq | EXTENDED_RELEASE_TABLET | Freq: Every day | ORAL | 0 refills | Status: DC

## 2020-07-13 MED ORDER — FAMOTIDINE IN NACL 20-0.9 MG/50ML-% IV SOLN
INTRAVENOUS | Status: AC
  Filled 2020-07-13: qty 50

## 2020-07-13 MED ORDER — POTASSIUM CHLORIDE CRYS ER 20 MEQ PO TBCR
40.0000 meq | EXTENDED_RELEASE_TABLET | Freq: Once | ORAL | Status: AC
  Administered 2020-07-13: 40 meq via ORAL

## 2020-07-13 MED ORDER — CLONIDINE HCL 0.1 MG PO TABS
ORAL_TABLET | ORAL | Status: AC
  Filled 2020-07-13: qty 1

## 2020-07-13 NOTE — Patient Instructions (Signed)

## 2020-07-13 NOTE — Patient Instructions (Signed)
[facility] Discharge Instructions for Patients Receiving Chemotherapy  Today you received the following chemotherapy agents: rituximab.  To help prevent nausea and vomiting after your treatment, we encourage you to take your nausea medication as directed.   If you develop nausea and vomiting that is not controlled by your nausea medication, call the clinic.   BELOW ARE SYMPTOMS THAT SHOULD BE REPORTED IMMEDIATELY:  *FEVER GREATER THAN 100.5 F  *CHILLS WITH OR WITHOUT FEVER  NAUSEA AND VOMITING THAT IS NOT CONTROLLED WITH YOUR NAUSEA MEDICATION  *UNUSUAL SHORTNESS OF BREATH  *UNUSUAL BRUISING OR BLEEDING  TENDERNESS IN MOUTH AND THROAT WITH OR WITHOUT PRESENCE OF ULCERS  *URINARY PROBLEMS  *BOWEL PROBLEMS  UNUSUAL RASH Items with * indicate a potential emergency and should be followed up as soon as possible.  Feel free to call the clinic should you have any questions or concerns. The clinic phone number is [phone].  Please show the CHEMO ALERT CARD at check-in to the Emergency Department and triage nurse.   

## 2020-07-13 NOTE — Progress Notes (Signed)
Patient's blood pressure reading elevated upon completion of rituxan infusion. Sandi Mealy, PA-C, called to bedside. Patient reported feeling a headache and states that she does not take anything for elevated blood pressure. Received verbal orders for clonidine 0.1 mg and to wait 15 minutes before retaking blood pressure reading. Patient agreeable to medication.  Vitals rechecked and blood pressure remained elevated. Sandi Mealy made aware. Encouraged patient to monitor blood pressure and follow-up with PCP. Patient verbalized an understanding of the education. Per Sandi Mealy, patient is free to okay to go home. Patient reports no longer having a headache and shows no signs of distress.

## 2020-07-18 ENCOUNTER — Other Ambulatory Visit: Payer: Self-pay | Admitting: *Deleted

## 2020-07-18 DIAGNOSIS — C8580 Other specified types of non-Hodgkin lymphoma, unspecified site: Secondary | ICD-10-CM

## 2020-07-20 ENCOUNTER — Inpatient Hospital Stay: Payer: Medicare Other | Attending: Hematology and Oncology

## 2020-07-20 ENCOUNTER — Inpatient Hospital Stay: Payer: Medicare Other

## 2020-07-20 ENCOUNTER — Inpatient Hospital Stay (HOSPITAL_BASED_OUTPATIENT_CLINIC_OR_DEPARTMENT_OTHER): Payer: Medicare Other | Admitting: Hematology and Oncology

## 2020-07-20 ENCOUNTER — Other Ambulatory Visit: Payer: Self-pay

## 2020-07-20 VITALS — BP 172/78 | HR 63 | Resp 18

## 2020-07-20 VITALS — BP 169/73 | HR 78 | Temp 97.3°F | Resp 18 | Ht 61.0 in | Wt 122.6 lb

## 2020-07-20 DIAGNOSIS — Z5112 Encounter for antineoplastic immunotherapy: Secondary | ICD-10-CM | POA: Insufficient documentation

## 2020-07-20 DIAGNOSIS — C8580 Other specified types of non-Hodgkin lymphoma, unspecified site: Secondary | ICD-10-CM

## 2020-07-20 DIAGNOSIS — C8514 Unspecified B-cell lymphoma, lymph nodes of axilla and upper limb: Secondary | ICD-10-CM | POA: Insufficient documentation

## 2020-07-20 DIAGNOSIS — Z95828 Presence of other vascular implants and grafts: Secondary | ICD-10-CM

## 2020-07-20 LAB — CMP (CANCER CENTER ONLY)
ALT: 11 U/L (ref 0–44)
AST: 14 U/L — ABNORMAL LOW (ref 15–41)
Albumin: 4 g/dL (ref 3.5–5.0)
Alkaline Phosphatase: 62 U/L (ref 38–126)
Anion gap: 9 (ref 5–15)
BUN: 25 mg/dL — ABNORMAL HIGH (ref 8–23)
CO2: 25 mmol/L (ref 22–32)
Calcium: 9.6 mg/dL (ref 8.9–10.3)
Chloride: 104 mmol/L (ref 98–111)
Creatinine: 1.09 mg/dL — ABNORMAL HIGH (ref 0.44–1.00)
GFR, Estimated: 52 mL/min — ABNORMAL LOW (ref >60)
Glucose, Bld: 111 mg/dL — ABNORMAL HIGH (ref 70–99)
Potassium: 3.3 mmol/L — ABNORMAL LOW (ref 3.5–5.1)
Sodium: 138 mmol/L (ref 135–145)
Total Bilirubin: 0.5 mg/dL (ref 0.3–1.2)
Total Protein: 6.6 g/dL (ref 6.5–8.1)

## 2020-07-20 LAB — LACTATE DEHYDROGENASE: LDH: 171 U/L (ref 98–192)

## 2020-07-20 LAB — CBC WITH DIFFERENTIAL (CANCER CENTER ONLY)
Abs Immature Granulocytes: 0.04 10*3/uL (ref 0.00–0.07)
Basophils Absolute: 0.1 10*3/uL (ref 0.0–0.1)
Basophils Relative: 1 %
Eosinophils Absolute: 0.4 10*3/uL (ref 0.0–0.5)
Eosinophils Relative: 6 %
HCT: 34.7 % — ABNORMAL LOW (ref 36.0–46.0)
Hemoglobin: 12.4 g/dL (ref 12.0–15.0)
Immature Granulocytes: 1 %
Lymphocytes Relative: 24 %
Lymphs Abs: 1.5 10*3/uL (ref 0.7–4.0)
MCH: 30.8 pg (ref 26.0–34.0)
MCHC: 35.7 g/dL (ref 30.0–36.0)
MCV: 86.1 fL (ref 80.0–100.0)
Monocytes Absolute: 0.6 10*3/uL (ref 0.1–1.0)
Monocytes Relative: 9 %
Neutro Abs: 3.8 10*3/uL (ref 1.7–7.7)
Neutrophils Relative %: 59 %
Platelet Count: 155 10*3/uL (ref 150–400)
RBC: 4.03 MIL/uL (ref 3.87–5.11)
RDW: 15.2 % (ref 11.5–15.5)
WBC Count: 6.4 10*3/uL (ref 4.0–10.5)
nRBC: 0 % (ref 0.0–0.2)

## 2020-07-20 MED ORDER — SODIUM CHLORIDE 0.9 % IV SOLN
Freq: Once | INTRAVENOUS | Status: AC
  Filled 2020-07-20: qty 250

## 2020-07-20 MED ORDER — SODIUM CHLORIDE 0.9% FLUSH
10.0000 mL | INTRAVENOUS | Status: DC | PRN
  Filled 2020-07-20: qty 10

## 2020-07-20 MED ORDER — SODIUM CHLORIDE 0.9 % IV SOLN
375.0000 mg/m2 | Freq: Once | INTRAVENOUS | Status: AC
  Administered 2020-07-20: 600 mg via INTRAVENOUS
  Filled 2020-07-20: qty 50

## 2020-07-20 MED ORDER — FAMOTIDINE IN NACL 20-0.9 MG/50ML-% IV SOLN
INTRAVENOUS | Status: AC
  Filled 2020-07-20: qty 50

## 2020-07-20 MED ORDER — DIPHENHYDRAMINE HCL 25 MG PO CAPS
ORAL_CAPSULE | ORAL | Status: AC
  Filled 2020-07-20: qty 2

## 2020-07-20 MED ORDER — DIPHENHYDRAMINE HCL 25 MG PO CAPS
50.0000 mg | ORAL_CAPSULE | Freq: Once | ORAL | Status: AC
  Administered 2020-07-20: 50 mg via ORAL

## 2020-07-20 MED ORDER — HEPARIN SOD (PORK) LOCK FLUSH 100 UNIT/ML IV SOLN
500.0000 [IU] | Freq: Once | INTRAVENOUS | Status: DC | PRN
  Filled 2020-07-20: qty 5

## 2020-07-20 MED ORDER — ACETAMINOPHEN 325 MG PO TABS
ORAL_TABLET | ORAL | Status: AC
  Filled 2020-07-20: qty 2

## 2020-07-20 MED ORDER — ACETAMINOPHEN 325 MG PO TABS
650.0000 mg | ORAL_TABLET | Freq: Once | ORAL | Status: AC
  Administered 2020-07-20: 650 mg via ORAL

## 2020-07-20 MED ORDER — SODIUM CHLORIDE 0.9% FLUSH
10.0000 mL | INTRAVENOUS | Status: DC | PRN
  Administered 2020-07-20: 10 mL
  Filled 2020-07-20: qty 10

## 2020-07-20 MED ORDER — FAMOTIDINE IN NACL 20-0.9 MG/50ML-% IV SOLN
20.0000 mg | Freq: Once | INTRAVENOUS | Status: AC
  Administered 2020-07-20: 20 mg via INTRAVENOUS

## 2020-07-20 NOTE — Patient Instructions (Signed)
[facility] Discharge Instructions for Patients Receiving Chemotherapy  Today you received the following chemotherapy agents:  Rituxan.  To help prevent nausea and vomiting after your treatment, we encourage you to take your nausea medication as directed.   If you develop nausea and vomiting that is not controlled by your nausea medication, call the clinic.   BELOW ARE SYMPTOMS THAT SHOULD BE REPORTED IMMEDIATELY:  *FEVER GREATER THAN 100.5 F  *CHILLS WITH OR WITHOUT FEVER  NAUSEA AND VOMITING THAT IS NOT CONTROLLED WITH YOUR NAUSEA MEDICATION  *UNUSUAL SHORTNESS OF BREATH  *UNUSUAL BRUISING OR BLEEDING  TENDERNESS IN MOUTH AND THROAT WITH OR WITHOUT PRESENCE OF ULCERS  *URINARY PROBLEMS  *BOWEL PROBLEMS  UNUSUAL RASH Items with * indicate a potential emergency and should be followed up as soon as possible.  Feel free to call the clinic should you have any questions or concerns. The clinic phone number is [phone].  Please show the CHEMO ALERT CARD at check-in to the Emergency Department and triage nurse.   

## 2020-07-27 ENCOUNTER — Inpatient Hospital Stay: Payer: Medicare Other

## 2020-07-27 ENCOUNTER — Other Ambulatory Visit: Payer: Self-pay | Admitting: Hematology and Oncology

## 2020-07-27 ENCOUNTER — Other Ambulatory Visit: Payer: Self-pay

## 2020-07-27 ENCOUNTER — Ambulatory Visit: Payer: Medicare Other | Admitting: Hematology and Oncology

## 2020-07-27 VITALS — BP 177/75 | HR 70 | Temp 97.6°F | Resp 18

## 2020-07-27 DIAGNOSIS — C8514 Unspecified B-cell lymphoma, lymph nodes of axilla and upper limb: Secondary | ICD-10-CM | POA: Diagnosis not present

## 2020-07-27 DIAGNOSIS — Z95828 Presence of other vascular implants and grafts: Secondary | ICD-10-CM

## 2020-07-27 DIAGNOSIS — Z5112 Encounter for antineoplastic immunotherapy: Secondary | ICD-10-CM | POA: Diagnosis not present

## 2020-07-27 DIAGNOSIS — C8580 Other specified types of non-Hodgkin lymphoma, unspecified site: Secondary | ICD-10-CM

## 2020-07-27 LAB — CBC WITH DIFFERENTIAL (CANCER CENTER ONLY)
Abs Immature Granulocytes: 0.04 10*3/uL (ref 0.00–0.07)
Basophils Absolute: 0.1 10*3/uL (ref 0.0–0.1)
Basophils Relative: 1 %
Eosinophils Absolute: 0.3 10*3/uL (ref 0.0–0.5)
Eosinophils Relative: 3 %
HCT: 35.1 % — ABNORMAL LOW (ref 36.0–46.0)
Hemoglobin: 12.7 g/dL (ref 12.0–15.0)
Immature Granulocytes: 1 %
Lymphocytes Relative: 19 %
Lymphs Abs: 1.6 10*3/uL (ref 0.7–4.0)
MCH: 31.5 pg (ref 26.0–34.0)
MCHC: 36.2 g/dL — ABNORMAL HIGH (ref 30.0–36.0)
MCV: 87.1 fL (ref 80.0–100.0)
Monocytes Absolute: 0.7 10*3/uL (ref 0.1–1.0)
Monocytes Relative: 8 %
Neutro Abs: 5.8 10*3/uL (ref 1.7–7.7)
Neutrophils Relative %: 68 %
Platelet Count: 145 10*3/uL — ABNORMAL LOW (ref 150–400)
RBC: 4.03 MIL/uL (ref 3.87–5.11)
RDW: 14.7 % (ref 11.5–15.5)
WBC Count: 8.4 10*3/uL (ref 4.0–10.5)
nRBC: 0 % (ref 0.0–0.2)

## 2020-07-27 LAB — CMP (CANCER CENTER ONLY)
ALT: 8 U/L (ref 0–44)
AST: 13 U/L — ABNORMAL LOW (ref 15–41)
Albumin: 4.1 g/dL (ref 3.5–5.0)
Alkaline Phosphatase: 58 U/L (ref 38–126)
Anion gap: 8 (ref 5–15)
BUN: 19 mg/dL (ref 8–23)
CO2: 24 mmol/L (ref 22–32)
Calcium: 9.7 mg/dL (ref 8.9–10.3)
Chloride: 107 mmol/L (ref 98–111)
Creatinine: 1.17 mg/dL — ABNORMAL HIGH (ref 0.44–1.00)
GFR, Estimated: 48 mL/min — ABNORMAL LOW (ref >60)
Glucose, Bld: 107 mg/dL — ABNORMAL HIGH (ref 70–99)
Potassium: 3.3 mmol/L — ABNORMAL LOW (ref 3.5–5.1)
Sodium: 139 mmol/L (ref 135–145)
Total Bilirubin: 0.6 mg/dL (ref 0.3–1.2)
Total Protein: 6.7 g/dL (ref 6.5–8.1)

## 2020-07-27 LAB — LACTATE DEHYDROGENASE: LDH: 188 U/L (ref 98–192)

## 2020-07-27 MED ORDER — SODIUM CHLORIDE 0.9% FLUSH
10.0000 mL | INTRAVENOUS | Status: DC | PRN
  Administered 2020-07-27: 10 mL
  Filled 2020-07-27: qty 10

## 2020-07-27 MED ORDER — HEPARIN SOD (PORK) LOCK FLUSH 100 UNIT/ML IV SOLN
500.0000 [IU] | Freq: Once | INTRAVENOUS | Status: AC | PRN
  Administered 2020-07-27: 500 [IU]
  Filled 2020-07-27: qty 5

## 2020-07-27 MED ORDER — FAMOTIDINE IN NACL 20-0.9 MG/50ML-% IV SOLN
20.0000 mg | Freq: Once | INTRAVENOUS | Status: AC
  Administered 2020-07-27: 20 mg via INTRAVENOUS

## 2020-07-27 MED ORDER — DIPHENHYDRAMINE HCL 25 MG PO CAPS
50.0000 mg | ORAL_CAPSULE | Freq: Once | ORAL | Status: AC
  Administered 2020-07-27: 50 mg via ORAL

## 2020-07-27 MED ORDER — POTASSIUM CHLORIDE CRYS ER 20 MEQ PO TBCR
EXTENDED_RELEASE_TABLET | ORAL | Status: AC
  Filled 2020-07-27: qty 2

## 2020-07-27 MED ORDER — SODIUM CHLORIDE 0.9 % IV SOLN
375.0000 mg/m2 | Freq: Once | INTRAVENOUS | Status: AC
  Administered 2020-07-27: 600 mg via INTRAVENOUS
  Filled 2020-07-27: qty 50

## 2020-07-27 MED ORDER — ACETAMINOPHEN 325 MG PO TABS
650.0000 mg | ORAL_TABLET | Freq: Once | ORAL | Status: AC
  Administered 2020-07-27: 650 mg via ORAL

## 2020-07-27 MED ORDER — FAMOTIDINE IN NACL 20-0.9 MG/50ML-% IV SOLN
INTRAVENOUS | Status: AC
  Filled 2020-07-27: qty 50

## 2020-07-27 MED ORDER — SODIUM CHLORIDE 0.9 % IV SOLN
Freq: Once | INTRAVENOUS | Status: AC
  Filled 2020-07-27: qty 250

## 2020-07-27 MED ORDER — DIPHENHYDRAMINE HCL 25 MG PO CAPS
ORAL_CAPSULE | ORAL | Status: AC
  Filled 2020-07-27: qty 2

## 2020-07-27 MED ORDER — POTASSIUM CHLORIDE CRYS ER 20 MEQ PO TBCR
40.0000 meq | EXTENDED_RELEASE_TABLET | Freq: Once | ORAL | Status: AC
  Administered 2020-07-27: 40 meq via ORAL

## 2020-07-27 MED ORDER — ACETAMINOPHEN 325 MG PO TABS
ORAL_TABLET | ORAL | Status: AC
  Filled 2020-07-27: qty 2

## 2020-07-27 NOTE — Patient Instructions (Signed)
[facility] Discharge Instructions for Patients Receiving Chemotherapy  Today you received the following chemotherapy agents:  Rituxan.  To help prevent nausea and vomiting after your treatment, we encourage you to take your nausea medication as directed.   If you develop nausea and vomiting that is not controlled by your nausea medication, call the clinic.   BELOW ARE SYMPTOMS THAT SHOULD BE REPORTED IMMEDIATELY:  *FEVER GREATER THAN 100.5 F  *CHILLS WITH OR WITHOUT FEVER  NAUSEA AND VOMITING THAT IS NOT CONTROLLED WITH YOUR NAUSEA MEDICATION  *UNUSUAL SHORTNESS OF BREATH  *UNUSUAL BRUISING OR BLEEDING  TENDERNESS IN MOUTH AND THROAT WITH OR WITHOUT PRESENCE OF ULCERS  *URINARY PROBLEMS  *BOWEL PROBLEMS  UNUSUAL RASH Items with * indicate a potential emergency and should be followed up as soon as possible.  Feel free to call the clinic should you have any questions or concerns. The clinic phone number is [phone].  Please show the CHEMO ALERT CARD at check-in to the Emergency Department and triage nurse.   

## 2020-07-29 NOTE — Progress Notes (Signed)
[place] [phone]   [phone]  PROGRESS NOTE  Patient Care Team: Ma Hillock, DO as PCP - General (Family Medicine) Danie Binder, MD (Inactive) as Consulting Physician (Gastroenterology) Gala Romney Cristopher Estimable, MD as Consulting Physician (Gastroenterology) Minus Breeding, MD as Consulting Physician (Cardiology) Annitta Needs, NP (Gastroenterology) Carlis Stable, NP as Nurse Practitioner (Gastroenterology)  Hematological/Oncological History  # Low Grade Marginal Zone Lymphoma, Stage III 1) 06/06/2019: patient underwent a diagnostic mammogram of the left breast. Calcifications noted in the left breast, recommended 6 month f/u imaging. 2) 02/02/2020: bilateral diagnostic mammogram showedabnormal enlarged lymph nodes with cortical thickening. The largest of these measures 2.2 x 1.7 x 2.0 cm. 3) 02/02/2020: US guided biopsy of left axillary lymph nodes performed. 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 4) 02/16/2020: establish care with Dr. Lorenso Courier 5) 02/29/2020: PET CT scan performed, showed hypermetabolic lymph nodes identified in the posterior left neck, both axillary/subpectoral regions, mediastinum, hila, right external iliac chain, and right groin. Massive splenomegaly of 17.8 cm noted as well.  6) 03/20/2020: excisional biopsy of left posterior cervical lymph node. Biopsy confirmed most likely low grade marginal zone lymphoma.   7) 7/14-7/15/2021: Cycle 1 Day 1 of Rituximab/Bendamycin 8) 03/30/2020-04/01/2020: admitted inpatient for hematemesis. Noted to have marked esophagitis on EGD.  9) 04/25/2020: IV feraheme 510mg  administered 10) 04/26/2020: Cycle 2 Day 1 of Rituximab/Bendamycin 11) 05/10/2020: HOLD R-Benda in setting of colitis, GI bleed, and  intolerance to therapy. Plan to start monotherapy Rituximab once patient has rebounded.  12) 07/06/2020: Cycle 1 Week 1 of monotherapy rituximab 13) 07/30/2020: Cycle 1 Week 3 of monotherapy rituximab  Interval History:  Elizabeth Mcintyre 79 y.o. 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 The patient's last visit was on 07/04/2020 for continued f/u. In interim she has completed 2 doses of rituximab.   On exam today Mrs. Wheat notes she has been well in the interim since her last visit.  She reports that she has been having less fatigue as compared to her prior treatment of immunotherapy with chemotherapy.  She reports that her appetite has been stable and she has not been having any issues with fevers, chills, sweats, nausea, vomiting or diarrhea.  She denies having any blood in her stool or any other overt signs of bleeding at this time.  She reports that GI is trying to schedule her for a dilation, however she wanted to discuss the procedure with [place] before she proceeded.  Otherwise she is currently asymptomatic.  A full 10 point ROS is listed below.  MEDICAL HISTORY:  Past Medical History:  Diagnosis Date  . Asthma   . Bloating 04/26/2019  . Cancer ([place]) 2021   Lymphoma  . 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 DIABETES MELLITUS, TYPE II 11/09/2007   diet control  . Diverticulosis 03/2011  . GERD (gastroesophageal reflux disease)    none recently  . GI bleed   . History of rheumatoid arthritis    during 30's, was treated.  . Hyperlipidemia   . Hypertension   . IBS (irritable bowel syndrome)   . Osteopenia 2017   Last  bone density 05/04/2017: -2.4  . PONV (postoperative nausea and vomiting)   .  Stress incontinence   . Stroke [facility])    mini stroke - found on a CT scan    SURGICAL HISTORY: Past Surgical History:  Procedure Laterality Date  . ABDOMINAL  HYSTERECTOMY    . BIOPSY  03/30/2020   Procedure: BIOPSY;  Surgeon: Ronald Lobo, MD;  Location: [facility];  Service: Endoscopy;;  . BIOPSY  05/02/2020   Procedure: BIOPSY;  Surgeon: Montez Morita, Quillian Quince, MD;  Location: [facility];  Service: Gastroenterology;;  . CARDIAC CATHETERIZATION     X 2, last one in 1998  . CHOLECYSTECTOMY N/A 04/13/2017   Procedure: LAPAROSCOPIC CHOLECYSTECTOMY;  Surgeon: Aviva Signs, MD;  Location: [facility];  Service: General;  Laterality: N/A;  . COLONOSCOPY    . COLONOSCOPY  May 2012   Dr. Olevia Perches: mild diverticulosis, otherwise normal.   . COLONOSCOPY WITH PROPOFOL N/A 05/02/2020   Dr. Jenetta Downer: 8 mm polyp removed from the ascending colon, 2 mm polyp removed from the ascending colon.  Tubular adenomas.  Diverticulosis.  Mucosal ulceration in the sigmoid colon noted, pathology consistent with ischemic colitis.  Marland Kitchen ESOPHAGOGASTRODUODENOSCOPY N/A 01/28/2015   Dr. Gala Romney: reflux esophagitis, Schatzki's ring not manipulated due to recent bleeding  . ESOPHAGOGASTRODUODENOSCOPY N/A 03/30/2015   Dr. Gala Romney: Schatzki's ring s/p Venia Minks dilation, previously noted esophageal ulcer completely healed  . ESOPHAGOGASTRODUODENOSCOPY N/A 03/30/2020   Buccini: Moderately severe erosive, circumferential, confluent esophagitis with no bleeding found 25 to 40 cm from incisors.  Nonobstructing and mild Schatzki ring, there were also multiple distal esophageal rings noted, minimal hiatal hernia.  . IR IMAGING GUIDED PORT INSERTION  03/13/2020  . LYMPH NODE BIOPSY Left 03/20/2020   Procedure: LEFT POSTERIOR CERVICAL LYMPH NODE BIOPSY;  Surgeon: Georganna Skeans, MD;  Location: [place];  Service: General;  Laterality: Left;  Marland Kitchen MALONEY DILATION N/A 03/30/2015   Procedure: Venia Minks DILATION;  Surgeon: Daneil Dolin, MD;  [facility];  Service: Endoscopy;  Laterality: N/A;  . POLYPECTOMY  05/02/2020   Procedure: POLYPECTOMY;  Surgeon: Harvel Quale, MD;  Location[facility];  Service: Gastroenterology;;    SOCIAL HISTORY: Social History   Socioeconomic History  . Marital status: Widowed    Spouse name: Not on file  . Number of children: 2  . Years of education: Not on file  . Highest education level: Not on file  Occupational History  . Occupation: retired  Tobacco Use  . Smoking status: Never Smoker  . Smokeless tobacco: Never Used  Vaping Use  . Vaping Use: Never used  Substance and Sexual Activity  . Alcohol use: No  . Drug use: No  . Sexual activity: Never  Other Topics Concern  . Not on file  Social History Narrative   Ms. Caicedo is widowed. Her young grandson lives with her, for whom she shares custody with her daughter, the son's aunt.     Social Determinants of Health   Financial Resource Strain:   . Difficulty of Paying Living Expenses: Not on file  Food Insecurity:   . Worried About Charity fundraiser in the Last Year: Not on file  . Ran Out of Food in the Last Year: Not on file  Transportation Needs:   . Lack of Transportation (Medical): Not on file  . Lack of Transportation (Non-Medical): Not on file  Physical Activity:   . Days of Exercise per Week: Not on file  . Minutes of Exercise per Session: Not on file  Stress:   . Feeling of Stress :  Not on file  Social Connections:   . Frequency of Communication with Friends and Family: Not on file  . Frequency of Social Gatherings with Friends and Family: Not on file  . Attends Religious Services: Not on file  . Active Member of Clubs or Organizations: Not on file  . Attends Archivist Meetings: Not on file  . Marital Status: Not on file  Intimate Partner Violence:   . Fear of Current or Ex-Partner: Not on file  . Emotionally Abused: Not on file  . Physically Abused: Not on file  . Sexually Abused: Not on file    FAMILY HISTORY: Family History  Problem Relation Age of Onset  . Heart disease Mother   . Osteoarthritis Mother   . Sudden death Father    . Single kidney Father   . Other Father        h/o severe MVA injuries  . Hyperlipidemia Sister   . Other Daughter        Myalgias  . Fibromyalgia Daughter   . Allergies Daughter   . Heart disease Maternal Grandfather   . Sudden death Paternal Grandmother   . Diabetes Paternal Grandfather   . Heart disease Daughter   . Other Daughter        palpitations  . Pulmonary fibrosis Maternal Aunt   . Cancer Paternal Uncle   . Pulmonary fibrosis Maternal Aunt   . Colon cancer Neg Hx     ALLERGIES:  is allergic to codeine phosphate.  MEDICATIONS:  Current Outpatient Medications  Medication Sig Dispense Refill  . acetaminophen (TYLENOL) 325 MG tablet Take 650 mg by mouth every 6 (six) hours as needed for moderate pain.     Marland Kitchen albuterol (VENTOLIN HFA) 108 (90 Base) MCG/ACT inhaler Inhale 2 puffs into the lungs every 6 (six) hours as needed for wheezing. 8.5 g 2  . allopurinol (ZYLOPRIM) 300 MG tablet Take 1 tablet (300 mg total) by mouth daily. 30 tablet 3  . Cholecalciferol (VITAMIN D3) 25 MCG (1000 UT) CAPS Take 1,000 Units by mouth daily.     Marland Kitchen levothyroxine (SYNTHROID) 75 MCG tablet Take 1 tablet (75 mcg total) by mouth daily. 90 tablet 3  . lidocaine-prilocaine (EMLA) cream Apply 1 application topically as needed. 30 g 1  . loperamide (IMODIUM) 2 MG capsule Take 1 capsule (2 mg total) by mouth every 6 (six) hours as needed for diarrhea or loose stools. 30 capsule 0  . magic mouthwash w/lidocaine SOLN Take 5 mLs by mouth 4 (four) times daily as needed for mouth pain. (Patient not taking: Reported on 07/11/2020) 260 mL 1  . pantoprazole (PROTONIX) 40 MG tablet Take 1 tablet (40 mg total) by mouth 2 (two) times daily before a meal. 60 tablet 3  . potassium chloride SA (KLOR-CON) 20 MEQ tablet Take 1 tablet (20 mEq total) by mouth daily. 21 tablet 0  . rOPINIRole (REQUIP) 0.25 MG tablet Take 1 tablet (0.25 mg total) by mouth at bedtime. 90 tablet 1  . sucralfate (CARAFATE) 1 GM/10ML  suspension Take 10 mLs (1 g total) by mouth 4 (four) times daily as needed (for throat/mouth burning). 420 mL 1  . vitamin B-12 (CYANOCOBALAMIN) 1000 MCG tablet Take 1,000 mcg by mouth every other day.      No current facility-administered medications for this visit.    REVIEW OF SYSTEMS:   Constitutional: ( - ) fevers, ( - )  chills , ( - ) night sweats Eyes: ( - ) blurriness  of vision, ( - ) double vision, ( - ) watery eyes Ears, nose, mouth, throat, and face: ( - ) mucositis, ( - ) sore throat Respiratory: ( - ) cough, ( - ) dyspnea, ( - ) wheezes Cardiovascular: ( - ) palpitation, ( - ) chest discomfort, ( - ) lower extremity swelling Gastrointestinal:  ( - ) nausea, ( - ) heartburn, ( - ) change in bowel habits Skin: ( - ) abnormal skin rashes Lymphatics: ( - ) new lymphadenopathy, ( - ) easy bruising Neurological: ( - ) numbness, ( - ) tingling, ( - ) new weaknesses Behavioral/Psych: ( - ) mood change, ( - ) new changes  All other systems were reviewed with the patient and are negative.  PHYSICAL EXAMINATION: ECOG PERFORMANCE STATUS: 1 - Symptomatic but completely ambulatory  Vitals:   07/20/20 1115  BP: (!) 169/73  Pulse: 78  Resp: 18  Temp: (!) 97.3 F (36.3 C)  SpO2: 100%   Filed Weights   07/20/20 1115  Weight: 122 lb 9.6 oz (55.6 kg)    GENERAL: well appearing elderly Caucasian female in NAD  SKIN: skin color, texture, turgor are normal. Small dime sized dry skin lesions on chest, about 5 in number. Improved from prior EYES: conjunctiva are pink and non-injected, sclera clear NECK: supple, non-tender.  LUNGS: clear to auscultation and percussion with normal breathing effort HEART: regular rate & rhythm and no murmurs and no lower extremity edema Musculoskeletal: no cyanosis of digits and no clubbing  PSYCH: alert & oriented x 3, fluent speech NEURO: no focal motor/sensory deficits  LABORATORY DATA:  I have reviewed the data as listed CBC Latest Ref Rng &  Units 07/27/2020 07/20/2020 07/13/2020  WBC 4.0 - 10.5 K/uL 8.4 6.4 5.5  Hemoglobin 12.0 - 15.0 g/dL 12.7 12.4 12.3  Hematocrit 36 - 46 % 35.1(L) 34.7(L) 34.6(L)  Platelets 150 - 400 K/uL 145(L) 155 144(L)    CMP Latest Ref Rng & Units 07/27/2020 07/20/2020 07/13/2020  Glucose 70 - 99 mg/dL 107(H) 111(H) 118(H)  BUN 8 - 23 mg/dL 19 25(H) 22  Creatinine 0.44 - 1.00 mg/dL 1.17(H) 1.09(H) 1.15(H)  Sodium 135 - 145 mmol/L 139 138 138  Potassium 3.5 - 5.1 mmol/L 3.3(L) 3.3(L) 3.3(L)  Chloride 98 - 111 mmol/L 107 104 106  CO2 22 - 32 mmol/L 24 25 22   Calcium 8.9 - 10.3 mg/dL 9.7 9.6 10.0  Total Protein 6.5 - 8.1 g/dL 6.7 6.6 6.5  Total Bilirubin 0.3 - 1.2 mg/dL 0.6 0.5 0.4  Alkaline Phos 38 - 126 U/L 58 62 51  AST 15 - 41 U/L 13(L) 14(L) 12(L)  ALT 0 - 44 U/L 8 11 10     RADIOGRAPHIC STUDIES: I have personally reviewed the radiological images as listed and agreed with the findings in the report: FDG avid spleen and axillary lymph nodes. Involvement of cervical and inguinal nodes as well.  No results found.  ASSESSMENT & PLAN TAWNA ALWIN 79 y.o. 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  On exam today Ms. Korell has been tolerating monotherapy rituximab well.  She is not having any symptoms and is actually had improvement in her fatigue as compared to when she was taking her prior round of chemotherapy.  Otherwise Ms. Sachs denies having any issues with fevers, chills, sweats, nausea, or vomiting.  She is agreeable to proceed with treatment today.  For her current diagonsis  I recommend a rituximab monotherapy given her intolerance of BR x 2 cycles. This regimen would consist of rituximab 375 mg/m2 IV q weekly x 4 weeks. During her scans on 8/16 - 05/03/2020 she was shown to have marked response to therapy with normalization of spleen size and resolution of lymphadenopathy.   GELF Criteria: 1  (splenomegaly). Indication for treatment  # Low Grade Marginal Zone Lymphoma, Stage III --plan to discontinue bendamustine + rituximab as patient is unable to tolerate this treatment. Will pursue rituximab monotherapy instead. Started therapy with Cycle 1 Day 1 on 03/28/2020 BR, plan to decrease to Ritux alone q weekly x 4 starting 07/06/2020. --patient completed excisional lymph node biopsy to confirm the diagnosis. Pathological exam of the lymph node confirms marginal zone lymphoma.  --PET CT scan confirms stage III disease. Patient meets GELF criteria for treatment (splenomegaly). She did not have any B symptoms and her counts were stable at diagnosis, with some mild thrombocytopenia.  --patient completed port placement and chemotherapy education. --RTC in 4 weeks with interval PET CT scan.   #Concern for GI Bleed, resolved #Hematemesis, resolved #Colitis, resolved --patient admitted on 03/30/2020 (day after Cycle 1 chemotherapy) with hematemesis --no overt GI bleeding noted on Upper GI evaluation -- no signs of bleeding since last visit.  --iron levels appeared low previously and patient was having symptoms of iron deficiency anemia including fatigue, restless leg, and dyspnea on exertion. -- Repeat labs today, Hgb continues rebounding nicely.   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   No orders of the defined types were placed in this encounter.   All questions were answered. The patient knows to call the clinic with any problems, questions or concerns.  A total of more than 30 minutes were spent on this encounter and over half of that time was spent on counseling and coordination of care as outlined above.   Ledell Peoples, MD Department of  Hematology/Oncology [place] [facility] Phone: [phone] Pager: [phone] Email: [email or web address]  07/29/2020 5:57 PM

## 2020-07-31 ENCOUNTER — Telehealth: Payer: Self-pay | Admitting: Hematology and Oncology

## 2020-07-31 NOTE — Telephone Encounter (Signed)
R/s appt per 11/14 sch msg - left message for patient with appt date and time

## 2020-08-03 ENCOUNTER — Ambulatory Visit: Payer: Medicare Other | Admitting: Hematology and Oncology

## 2020-08-14 ENCOUNTER — Ambulatory Visit (INDEPENDENT_AMBULATORY_CARE_PROVIDER_SITE_OTHER): Payer: Medicare Other | Admitting: Family Medicine

## 2020-08-14 ENCOUNTER — Other Ambulatory Visit: Payer: Self-pay

## 2020-08-14 ENCOUNTER — Encounter: Payer: Self-pay | Admitting: Family Medicine

## 2020-08-14 VITALS — BP 166/81 | HR 95 | Temp 98.0°F | Ht 62.0 in | Wt 123.0 lb

## 2020-08-14 DIAGNOSIS — N183 Chronic kidney disease, stage 3 unspecified: Secondary | ICD-10-CM

## 2020-08-14 DIAGNOSIS — I10 Essential (primary) hypertension: Secondary | ICD-10-CM

## 2020-08-14 DIAGNOSIS — Z9221 Personal history of antineoplastic chemotherapy: Secondary | ICD-10-CM | POA: Diagnosis not present

## 2020-08-14 DIAGNOSIS — F339 Major depressive disorder, recurrent, unspecified: Secondary | ICD-10-CM

## 2020-08-14 DIAGNOSIS — R7303 Prediabetes: Secondary | ICD-10-CM

## 2020-08-14 DIAGNOSIS — J4520 Mild intermittent asthma, uncomplicated: Secondary | ICD-10-CM

## 2020-08-14 DIAGNOSIS — Z0001 Encounter for general adult medical examination with abnormal findings: Secondary | ICD-10-CM | POA: Diagnosis not present

## 2020-08-14 DIAGNOSIS — Z1231 Encounter for screening mammogram for malignant neoplasm of breast: Secondary | ICD-10-CM

## 2020-08-14 DIAGNOSIS — G2581 Restless legs syndrome: Secondary | ICD-10-CM

## 2020-08-14 DIAGNOSIS — E1122 Type 2 diabetes mellitus with diabetic chronic kidney disease: Secondary | ICD-10-CM

## 2020-08-14 DIAGNOSIS — E782 Mixed hyperlipidemia: Secondary | ICD-10-CM

## 2020-08-14 DIAGNOSIS — E559 Vitamin D deficiency, unspecified: Secondary | ICD-10-CM

## 2020-08-14 DIAGNOSIS — C8580 Other specified types of non-Hodgkin lymphoma, unspecified site: Secondary | ICD-10-CM

## 2020-08-14 HISTORY — DX: Mixed hyperlipidemia: E78.2

## 2020-08-14 MED ORDER — ALBUTEROL SULFATE HFA 108 (90 BASE) MCG/ACT IN AERS: 2.0000 | INHALATION_SPRAY | Freq: Four times a day (QID) | RESPIRATORY_TRACT | 2 refills | Status: DC | PRN

## 2020-08-14 MED ORDER — ESCITALOPRAM OXALATE 10 MG PO TABS: 10.0000 mg | ORAL_TABLET | Freq: Every day | ORAL | 0 refills | Status: DC

## 2020-08-14 MED ORDER — ROPINIROLE HCL 0.25 MG PO TABS
0.2500 mg | ORAL_TABLET | Freq: Every day | ORAL | 1 refills | Status: DC
Start: 2020-08-14 — End: 2020-11-13

## 2020-08-14 MED ORDER — AMLODIPINE BESYLATE 10 MG PO TABS: 10.0000 mg | ORAL_TABLET | Freq: Every day | ORAL | 1 refills | Status: DC

## 2020-08-14 NOTE — Patient Instructions (Signed)
Health Maintenance After Age 78 After age 78, you are at a higher risk for certain long-term diseases and infections as well as injuries from falls. Falls are a major cause of broken bones and head injuries in people who are older than age 78. Getting regular preventive care can help to keep you healthy and well. Preventive care includes getting regular testing and making lifestyle changes as recommended by your health care provider. Talk with your health care provider about:  Which screenings and tests you should have. A screening is a test that checks for a disease when you have no symptoms.  A diet and exercise plan that is right for you. What should I know about screenings and tests to prevent falls? Screening and testing are the best ways to find a health problem early. Early diagnosis and treatment give you the best chance of managing medical conditions that are common after age 78. Certain conditions and lifestyle choices may make you more likely to have a fall. Your health care provider may recommend:  Regular vision checks. Poor vision and conditions such as cataracts can make you more likely to have a fall. If you wear glasses, make sure to get your prescription updated if your vision changes.  Medicine review. Work with your health care provider to regularly review all of the medicines you are taking, including over-the-counter medicines. Ask your health care provider about any side effects that may make you more likely to have a fall. Tell your health care provider if any medicines that you take make you feel dizzy or sleepy.  Osteoporosis screening. Osteoporosis is a condition that causes the bones to get weaker. This can make the bones weak and cause them to break more easily.  Blood pressure screening. Blood pressure changes and medicines to control blood pressure can make you feel dizzy.  Strength and balance checks. Your health care provider may recommend certain tests to check your  strength and balance while standing, walking, or changing positions.  Foot health exam. Foot pain and numbness, as well as not wearing proper footwear, can make you more likely to have a fall.  Depression screening. You may be more likely to have a fall if you have a fear of falling, feel emotionally low, or feel unable to do activities that you used to do.  Alcohol use screening. Using too much alcohol can affect your balance and may make you more likely to have a fall. What actions can I take to lower my risk of falls? General instructions  Talk with your health care provider about your risks for falling. Tell your health care provider if: ? You fall. Be sure to tell your health care provider about all falls, even ones that seem minor. ? You feel dizzy, sleepy, or off-balance.  Take over-the-counter and prescription medicines only as told by your health care provider. These include any supplements.  Eat a healthy diet and maintain a healthy weight. A healthy diet includes low-fat dairy products, low-fat (lean) meats, and fiber from whole grains, beans, and lots of fruits and vegetables. Home safety  Remove any tripping hazards, such as rugs, cords, and clutter.  Install safety equipment such as grab bars in bathrooms and safety rails on stairs.  Keep rooms and walkways well-lit. Activity   Follow a regular exercise program to stay fit. This will help you maintain your balance. Ask your health care provider what types of exercise are appropriate for you.  If you need a cane or   walker, use it as recommended by your health care provider.  Wear supportive shoes that have nonskid soles. Lifestyle  Do not drink alcohol if your health care provider tells you not to drink.  If you drink alcohol, limit how much you have: ? 0-1 drink a day for women. ? 0-2 drinks a day for men.  Be aware of how much alcohol is in your drink. In the [place] one drink equals one typical bottle of beer (12  oz), one-half glass of wine (5 oz), or one shot of hard liquor (1 oz).  Do not use any products that contain nicotine or tobacco, such as cigarettes and e-cigarettes. If you need help quitting, ask your health care provider. Summary  Having a healthy lifestyle and getting preventive care can help to protect your health and wellness after age 78.  Screening and testing are the best way to find a health problem early and help you avoid having a fall. Early diagnosis and treatment give you the best chance for managing medical conditions that are more common for people who are older than age 78.  Falls are a major cause of broken bones and head injuries in people who are older than age 78. Take precautions to prevent a fall at home.  Work with your health care provider to learn what changes you can make to improve your health and wellness and to prevent falls. This information is not intended to replace advice given to you by your health care provider. Make sure you discuss any questions you have with your health care provider. Document Revised: 12/23/2018 Document Reviewed: 07/15/2017 Elsevier Patient Education  2020 [employer]  

## 2020-08-14 NOTE — Progress Notes (Signed)
This visit occurred during the SARS-CoV-2 public health emergency.  Safety protocols were in place, including screening questions prior to the visit, additional usage of staff PPE, and extensive cleaning of exam room while observing appropriate contact time as indicated for disinfecting solutions.    Patient ID: Elizabeth Mcintyre, female  DOB: October 18, 1940, 79 y.o.   MRN: [id number] Patient Care Team    Relationship Specialty Notifications Start End  Ma Hillock, DO PCP - General Family Medicine  06/08/19   Danie Binder, MD (Inactive) Consulting Physician Gastroenterology  01/29/15   Daneil Dolin, MD Consulting Physician Gastroenterology  05/14/15   Minus Breeding, MD Consulting Physician Cardiology  01/30/17   Annitta Needs, NP  Gastroenterology  03/13/17   Carlis Stable, NP Nurse Practitioner Gastroenterology  09/29/18     Chief Complaint  Patient presents with  . Annual Exam    Subjective: Elizabeth Mcintyre is a 79 y.o.  Female  present for CPE. All past medical history, surgical history, allergies, family history, immunizations, medications and social history were updated in the electronic medical record today. All recent labs, ED visits and hospitalizations within the last year were reviewed.  Health maintenance:  Colonoscopy: completed 04/2020.  No further colonoscopies recommended.  Dr. Jenetta Downer Mammogram: completed:01/2020- BC-gso> ordered placed for next yr Cervical cancer screening: >65 not indicated Immunizations: tdap UTD 2012, Influenza declined (encouraged yearly), PNA series completed, shingrix declined  infectious disease screening: Hep C completed DEXA: last completed 04/2019- osteopenia -2.4. rpt 2 years Assistive device: none Oxygen OHY:WVPX Patient has a Dental home. Hospitalizations/ED visits: reviewed  Mild intermittent asthma without complication Patient reports rare use/need of albuterol inhaler.  Essential hypertension,  benign/hyperlipidemia/overweight/CKD 3 Pt reports non-compliance with amlodipine 10 mg daily.  She was told to hold her Norvasc during her hospitalization when she had a GI bleed.  She never restarted this medication.  Blood pressures reviewed in EMR through end of September and all of October have been significantly elevated.  Patient denies chest pain, shortness of breath, dizziness or lower extremity edema.   .  Pt was prescribed statin but discontinued. Diet: Low-sodium Exercise: Exercise routinely RF: Hypertension, hyperlipidemia, diabetes, overweight,, family history of heart disease  Acquired hypothyroidism Patient reports compliance with levothyroxine 75 mcg daily on an empty stomach.    Major depression, recurrent, chronic (HCC) Patient has a history of depression.  She was prescribed Lexapro 10 mg daily but discontinued medication since her last appointment.   Vitamin D deficiency/Osteopenia, unspecified location Patient has a history of vitamin D deficiency and osteopenia.  She reports she has been compliant with 2000 units of vitamin D daily.  Prediabetic: Patient has been diet controlled for quite some time.  A1c 6.3, 6.5, 5.7, 6.0    Gastroesophageal reflux disease with esophagitis/chronic bloating/chronic diarrhea Managed by gastroenterology.  Depression screen [facility] 2/9 08/14/2020 04/06/2020 12/29/2019 09/29/2018 01/25/2018  Decreased Interest 3 0 0 1 1  Down, Depressed, Hopeless 2 0 0 1 1  PHQ - 2 Score 5 0 0 2 2  Altered sleeping 3 3 3 3 1   Tired, decreased energy 0 3 0 2 1  Change in appetite 3 0 3 2 1   Feeling bad or failure about yourself  0 0 0 1 1  Trouble concentrating 3 0 3 0 0  Moving slowly or fidgety/restless 0 0 0 0 0  Suicidal thoughts 0 0 0 3 0  PHQ-9 Score 14 6 9 13 6   Difficult doing  work/chores - Not difficult at all Not difficult at all Somewhat difficult Somewhat difficult  Some recent data might be hidden   No flowsheet data  found.   Immunization History  Administered Date(s) Administered  . Influenza Split 07/16/2011  . Influenza, High Dose Seasonal PF 10/15/2017, 09/03/2018  . Influenza,inj,Quad PF,6+ Mos 06/09/2014, 10/15/2015  . Moderna SARS-COVID-2 Vaccination 11/10/2019, 12/09/2019  . Pneumococcal Conjugate-13 03/13/2017  . Pneumococcal Polysaccharide-23 10/27/2014  . Tdap 07/16/2011   Past Medical History:  Diagnosis Date  . Asthma   . Bloating 04/26/2019  . Cancer ([place]) 2021   Lymphoma  . 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 DIABETES MELLITUS, TYPE II 11/09/2007   diet control  . Diverticulosis 03/2011  . GERD (gastroesophageal reflux disease)    none recently  . GI bleed   . History of rheumatoid arthritis    during 30's, was treated.  . Hyperlipidemia   . Hypertension   . IBS (irritable bowel syndrome)   . Osteopenia 2017   Last  bone density 05/04/2017: -2.4  . PONV (postoperative nausea and vomiting)   . Stress incontinence   . Stroke [facility])    mini stroke - found on a CT scan   Allergies  Allergen Reactions  . Codeine Phosphate Nausea And Vomiting and Rash   Past Surgical History:  Procedure Laterality Date  . ABDOMINAL HYSTERECTOMY    . BIOPSY  03/30/2020   Procedure: BIOPSY;  Surgeon: Ronald Lobo, MD;  Location: [facility];  Service: Endoscopy;;  . BIOPSY  05/02/2020   Procedure: BIOPSY;  Surgeon: Montez Morita, Quillian Quince, MD;  Location: [facility];  Service: Gastroenterology;;  . CARDIAC CATHETERIZATION     X 2, last one in 1998  . CHOLECYSTECTOMY N/A 04/13/2017   Procedure: LAPAROSCOPIC CHOLECYSTECTOMY;  Surgeon: Aviva Signs, MD;  Location: [facility] ORS;  Service: General;  Laterality: N/A;  . COLONOSCOPY    . COLONOSCOPY  May 2012   Dr. Olevia Perches: mild diverticulosis, otherwise normal.   . COLONOSCOPY WITH PROPOFOL N/A 05/02/2020   Dr. Jenetta Downer: 8 mm polyp removed from the ascending colon, 2 mm polyp removed from the  ascending colon.  Tubular adenomas.  Diverticulosis.  Mucosal ulceration in the sigmoid colon noted, pathology consistent with ischemic colitis.  Marland Kitchen ESOPHAGOGASTRODUODENOSCOPY N/A 01/28/2015   Dr. Gala Romney: reflux esophagitis, Schatzki's ring not manipulated due to recent bleeding  . ESOPHAGOGASTRODUODENOSCOPY N/A 03/30/2015   Dr. Gala Romney: Schatzki's ring s/p Venia Minks dilation, previously noted esophageal ulcer completely healed  . ESOPHAGOGASTRODUODENOSCOPY N/A 03/30/2020   Buccini: Moderately severe erosive, circumferential, confluent esophagitis with no bleeding found 25 to 40 cm from incisors.  Nonobstructing and mild Schatzki ring, there were also multiple distal esophageal rings noted, minimal hiatal hernia.  . IR IMAGING GUIDED PORT INSERTION  03/13/2020  . LYMPH NODE BIOPSY Left 03/20/2020   Procedure: LEFT POSTERIOR CERVICAL LYMPH NODE BIOPSY;  Surgeon: Georganna Skeans, MD;  Location: [place];  Service: General;  Laterality: Left;  Marland Kitchen MALONEY DILATION N/A 03/30/2015   Procedure: Venia Minks DILATION;  Surgeon: Daneil Dolin, MD;  [facility];  Service: Endoscopy;  Laterality: N/A;  . POLYPECTOMY  05/02/2020   Procedure: POLYPECTOMY;  Surgeon: Harvel Quale, MD;  [facility];  Service: Gastroenterology;;   Family History  Problem Relation Age of Onset  . Heart disease Mother   . Osteoarthritis Mother   . Sudden death Father   . Single kidney  Father   . Other Father        h/o severe MVA injuries  . Hyperlipidemia Sister   . Other Daughter        Myalgias  . Fibromyalgia Daughter   . Allergies Daughter   . Heart disease Maternal Grandfather   . Sudden death Paternal Grandmother   . Diabetes Paternal Grandfather   . Heart disease Daughter   . Other Daughter        palpitations  . Pulmonary fibrosis Maternal Aunt   . Cancer Paternal Uncle   . Pulmonary fibrosis Maternal Aunt   . Colon cancer Neg Hx    Social History   Social History Narrative   Ms.  Meinhardt is widowed. Her young grandson lives with her, for whom she shares custody with her daughter, the son's aunt.      Allergies as of 08/14/2020      Reactions   Codeine Phosphate Nausea And Vomiting, Rash      Medication List       Accurate as of August 14, 2020  3:16 PM. If you have any questions, ask your nurse or doctor.        acetaminophen 325 MG tablet Commonly known as: TYLENOL Take 650 mg by mouth every 6 (six) hours as needed for moderate pain.   albuterol 108 (90 Base) MCG/ACT inhaler Commonly known as: VENTOLIN HFA Inhale 2 puffs into the lungs every 6 (six) hours as needed for wheezing.   allopurinol 300 MG tablet Commonly known as: ZYLOPRIM Take 1 tablet (300 mg total) by mouth daily.   levothyroxine 75 MCG tablet Commonly known as: SYNTHROID Take 1 tablet (75 mcg total) by mouth daily.   lidocaine-prilocaine cream Commonly known as: EMLA Apply 1 application topically as needed.   loperamide 2 MG capsule Commonly known as: IMODIUM Take 1 capsule (2 mg total) by mouth every 6 (six) hours as needed for diarrhea or loose stools.   magic mouthwash w/lidocaine Soln Take 5 mLs by mouth 4 (four) times daily as needed for mouth pain.   pantoprazole 40 MG tablet Commonly known as: Protonix Take 1 tablet (40 mg total) by mouth 2 (two) times daily before a meal.   potassium chloride SA 20 MEQ tablet Commonly known as: KLOR-CON Take 1 tablet (20 mEq total) by mouth daily.   rOPINIRole 0.25 MG tablet Commonly known as: Requip Take 1 tablet (0.25 mg total) by mouth at bedtime.   sucralfate 1 GM/10ML suspension Commonly known as: CARAFATE Take 10 mLs (1 g total) by mouth 4 (four) times daily as needed (for throat/mouth burning).   vitamin B-12 1000 MCG tablet Commonly known as: CYANOCOBALAMIN Take 1,000 mcg by mouth every other day.   Vitamin D3 25 MCG (1000 UT) Caps Take 1,000 Units by mouth daily.       All past medical history, surgical  history, allergies, family history, immunizations andmedications were updated in the EMR today and reviewed under the history and medication portions of their EMR.     ROS: 14 pt review of systems performed and negative (unless mentioned in an HPI)  Objective: BP (!) 166/81   Pulse 95   Temp 98 F (36.7 C) (Oral)   Ht 5\' 2"  (1.575 m)   Wt 123 lb (55.8 kg)   SpO2 98%   BMI 22.50 kg/m  Gen: Afebrile. No acute distress. Nontoxic in appearance, well-developed, well-nourished,  Pleasant female.  HENT: AT. [place]. Bilateral TM visualized and normal in appearance, normal  external auditory canal. MMM, no oral lesions, adequate dentition. Bilateral nares within normal limits. Throat without erythema, ulcerations or exudates. no Cough on exam, no hoarseness on exam. Eyes:Pupils Equal Round Reactive to light, Extraocular movements intact,  Conjunctiva without redness, discharge or icterus. Neck/lymp/endocrine: Supple, no lymphadenopathy, no thyromegaly CV: RRR , no edema, +2/4 P posterior tibialis pulses.  Chest: CTAB, no wheeze, rhonchi or crackles. normal Respiratory effort. good Air movement. Abd: Soft. flat. NTND. BS present. no Masses palpated. No hepatosplenomegaly. No rebound tenderness or guarding. Skin: no rashes, purpura or petechiae. Warm and well-perfused. Skin intact. Neuro/Msk:  Normal gait. PERLA. EOMi. Alert. Oriented x3.  Cranial nerves II through XII intact. Muscle strength 5/5 upper/lower extremity. DTRs equal bilaterally. Psych: Normal affect, dress and demeanor. Normal speech. Normal thought content and judgment.  No exam data present  Assessment/plan: CHIZARA MENA is a 79 y.o. female present for CPE  Mild intermittent asthma without complication Stable Continue albuterol as needed.    Essential hypertension, benign/hyperlipidemia/carotid artery stenosis -Significantly elevated since the end of September per EMR review. -Restart amlodipine 10 mg daily. -She had been on  Crestor.  We will continue to hold this for now since she is having some memory concerns. -Low-sodium diet, routine exercise. Follow-up in 4 weeks  Acquired hypothyroidism Last TSH within normal range.  We will continue levothyroxine 75 mcg daily if laboratory results are normal.  Otherwise will alter dose for her and follow-up closely for recheck.  CKD stage 3 secondary (HCC)/vitamin D deficiency/secondary hyperparathyroidism/osteopenia - Ren[place] dose meds when appropriate. - Baseline GFR approximately 40, baseline creatinine 1.38 - Avoid NSAIDs - continue vitamin D 2000 units daily  Prediabetes A1c 6.20 April 2019 >5.7 > 6.0> collected today She has been diet controlled  Gastroesophageal reflux disease with esophagitis Managed by gastroenterology.  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  Her daughter is also present today and concerned about her depression.  She is agreeable to restarting Lexapro 10 mg daily Follow-up in 4 weeks can taper at that time if needed  Breast cancer screening by mammogram - MM 3D SCREEN BREAST BILATERAL; Future  Encounter for preventive health examination with abnormal findings Patient was encouraged to exercise greater than 150 minutes a week. Patient was encouraged to choose a diet filled with fresh fruits and vegetables, and lean meats. AVS provided to patient today for education/recommendation on gender specific health and safety maintenance. Colonoscopy: completed 04/2020.  No further colonoscopies recommended.  Dr. Jenetta Downer Mammogram: completed:01/2020- BC-gso> ordered placed for next yr Cervical cancer screening: >65 not indicated Immunizations: tdap UTD 2012,  Influenza declined (encouraged yearly), PNA series completed, shingrix declined  infectious disease screening: Hep C completed DEXA: last completed 04/2019- osteopenia -2.4. rpt 2 years  Return in about 5 months (around 01/21/2021) for [place] (30 min) and 1 yr cpe.  Orders Placed This Encounter  Procedures  . MM 3D SCREEN BREAST BILATERAL  . Hemoglobin A1c  . Lipid panel  . TSH  . Vitamin D (25 hydroxy)    Orders Placed This Encounter  Procedures  . MM 3D SCREEN BREAST BILATERAL  . Hemoglobin A1c  . Lipid panel  . TSH  . Vitamin D (25 hydroxy)   Meds ordered this encounter  Medications  . rOPINIRole (REQUIP)  0.25 MG tablet    Sig: Take 1 tablet (0.25 mg total) by mouth at bedtime.    Dispense:  90 tablet    Refill:  1  . albuterol (VENTOLIN HFA) 108 (90 Base) MCG/ACT inhaler    Sig: Inhale 2 puffs into the lungs every 6 (six) hours as needed for wheezing.    Dispense:  8.5 g    Refill:  2    Please hold until pt request.   Referral Orders  No referral(s) requested today     Electronically signed by: Howard Pouch, [place]

## 2020-08-15 NOTE — Patient Instructions (Signed)
Elizabeth Mcintyre  08/15/2020     @PREFPERIOPPHARMACY @   Your procedure is scheduled on 08/21/2020.  Report to [facility] at  1215  P.M.  Call this number if you have problems the morning of surgery:  [phone]   Remember:  Follow the diet instructions given to you by the office.                   Take these medicines the morning of surgery with A SIP OF WATER  Allopurinol, amlodipine, lexapro, protonix.    Do not wear jewelry, make-up or nail polish.  Do not wear lotions, powders, or perfumes. Please wear deodorant and brush your teeth.  Do not shave 48 hours prior to surgery.  Men may shave face and neck.  Do not bring valuables to the hospital.  [facility] is not responsible for any belongings or valuables.  Contacts, dentures or bridgework may not be worn into surgery.  Leave your suitcase in the car.  After surgery it may be brought to your room.  For patients admitted to the hospital, discharge time will be determined by your treatment team.  Patients discharged the day of surgery will not be allowed to drive home.   Name and phone number of your driver:   family Special instructions:  DO NOT smoke the morning of your procedure.  Please read over the following fact sheets that you were given. Anesthesia Post-op Instructions and Care and Recovery After Surgery       Upper Endoscopy, Adult, Care After This sheet gives you information about how to care for yourself after your procedure. Your health care provider may also give you more specific instructions. If you have problems or questions, contact your health care provider. What can I expect after the procedure? After the procedure, it is common to have:  A sore throat.  Mild stomach pain or discomfort.  Bloating.  Nausea. Follow these instructions at home:   Follow instructions from your health care provider about what to eat or drink after your procedure.  Return to your normal activities  as told by your health care provider. Ask your health care provider what activities are safe for you.  Take over-the-counter and prescription medicines only as told by your health care provider.  Do not drive for 24 hours if you were given a sedative during your procedure.  Keep all follow-up visits as told by your health care provider. This is important. Contact a health care provider if you have:  A sore throat that lasts longer than one day.  Trouble swallowing. Get help right away if:  You vomit blood or your vomit looks like coffee grounds.  You have: ? A fever. ? Bloody, black, or tarry stools. ? A severe sore throat or you cannot swallow. ? Difficulty breathing. ? Severe pain in your chest or abdomen. Summary  After the procedure, it is common to have a sore throat, mild stomach discomfort, bloating, and nausea.  Do not drive for 24 hours if you were given a sedative during the procedure.  Follow instructions from your health care provider about what to eat or drink after your procedure.  Return to your normal activities as told by your health care provider. This information is not intended to replace advice given to you by your health care provider. Make sure you discuss any questions you have with your health care provider. Document Revised: 02/23/2018 Document Reviewed:  02/01/2018 Elsevier Patient Education  [place].  Esophageal Dilatation Esophageal dilatation, also called esophageal dilation, is a procedure to widen or open (dilate) a blocked or narrowed part of the esophagus. The esophagus is the part of the body that moves food and liquid from the mouth to the stomach. You may need this procedure if:  You have a buildup of scar tissue in your esophagus that makes it difficult, painful, or impossible to swallow. This can be caused by gastroesophageal reflux disease (GERD).  You have cancer of the esophagus.  There is a problem with how food moves  through your esophagus. In some cases, you may need this procedure repeated at a later time to dilate the esophagus gradually. Tell a health care provider about:  Any allergies you have.  All medicines you are taking, including vitamins, herbs, eye drops, creams, and over-the-counter medicines.  Any problems you or family members have had with anesthetic medicines.  Any blood disorders you have.  Any surgeries you have had.  Any medical conditions you have.  Any antibiotic medicines you are required to take before dental procedures.  Whether you are pregnant or may be pregnant. What are the risks? Generally, this is a safe procedure. However, problems may occur, including:  Bleeding due to a tear in the lining of the esophagus.  A hole (perforation) in the esophagus. What happens before the procedure?  Follow instructions from your health care provider about eating or drinking restrictions.  Ask your health care provider about changing or stopping your regular medicines. This is especially important if you are taking diabetes medicines or blood thinners.  Plan to have someone take you home from the hospital or clinic.  Plan to have a responsible adult care for you for at least 24 hours after you leave the hospital or clinic. This is important. What happens during the procedure?  You may be given a medicine to help you relax (sedative).  A numbing medicine may be sprayed into the back of your throat, or you may gargle the medicine.  Your health care provider may perform the dilatation using various surgical instruments, such as: ? Simple dilators. This instrument is carefully placed in the esophagus to stretch it. ? Guided wire bougies. This involves using an endoscope to insert a wire into the esophagus. A dilator is passed over this wire to enlarge the esophagus. Then the wire is removed. ? Balloon dilators. An endoscope with a small balloon at the end is inserted into the  esophagus. The balloon is inflated to stretch the esophagus and open it up. The procedure may vary among health care providers and hospitals. What happens after the procedure?  Your blood pressure, heart rate, breathing rate, and blood oxygen level will be monitored until the medicines you were given have worn off.  Your throat may feel slightly sore and numb. This will improve slowly over time.  You will not be allowed to eat or drink until your throat is no longer numb.  When you are able to drink, urinate, and sit on the edge of the bed without nausea or dizziness, you may be able to return home. Follow these instructions at home:  Take over-the-counter and prescription medicines only as told by your health care provider.  Do not drive for 24 hours if you were given a sedative during your procedure.  You should have a responsible adult with you for 24 hours after the procedure.  Follow instructions from your health care  provider about any eating or drinking restrictions.  Do not use any products that contain nicotine or tobacco, such as cigarettes and e-cigarettes. If you need help quitting, ask your health care provider.  Keep all follow-up visits as told by your health care provider. This is important. Get help right away if you:  Have a fever.  Have chest pain.  Have pain that is not relieved by medication.  Have trouble breathing.  Have trouble swallowing.  Vomit blood. Summary  Esophageal dilatation, also called esophageal dilation, is a procedure to widen or open (dilate) a blocked or narrowed part of the esophagus.  Plan to have someone take you home from the hospital or clinic.  For this procedure, a numbing medicine may be sprayed into the back of your throat, or you may gargle the medicine.  Do not drive for 24 hours if you were given a sedative during your procedure. This information is not intended to replace advice given to you by your health care  provider. Make sure you discuss any questions you have with your health care provider. Document Revised: 06/29/2019 Document Reviewed: 07/07/2017 Elsevier Patient Education  2020 [place] After These instructions provide you with information about caring for yourself after your procedure. Your health care provider may also give you more specific instructions. Your treatment has been planned according to current medical practices, but problems sometimes occur. Call your health care provider if you have any problems or questions after your procedure. What can I expect after the procedure? After your procedure, you may:  Feel sleepy for several hours.  Feel clumsy and have poor balance for several hours.  Feel forgetful about what happened after the procedure.  Have poor judgment for several hours.  Feel nauseous or vomit.  Have a sore throat if you had a breathing tube during the procedure. Follow these instructions at home: For at least 24 hours after the procedure:      Have a responsible adult stay with you. It is important to have someone help care for you until you are awake and alert.  Rest as needed.  Do not: ? Participate in activities in which you could fall or become injured. ? Drive. ? Use heavy machinery. ? Drink alcohol. ? Take sleeping pills or medicines that cause drowsiness. ? Make important decisions or sign legal documents. ? Take care of children on your own. Eating and drinking  Follow the diet that is recommended by your health care provider.  If you vomit, drink water, juice, or soup when you can drink without vomiting.  Make sure you have little or no nausea before eating solid foods. General instructions  Take over-the-counter and prescription medicines only as told by your health care provider.  If you have sleep apnea, surgery and certain medicines can increase your risk for breathing problems. Follow  instructions from your health care provider about wearing your sleep device: ? Anytime you are sleeping, including during daytime naps. ? While taking prescription pain medicines, sleeping medicines, or medicines that make you drowsy.  If you smoke, do not smoke without supervision.  Keep all follow-up visits as told by your health care provider. This is important. Contact a health care provider if:  You keep feeling nauseous or you keep vomiting.  You feel light-headed.  You develop a rash.  You have a fever. Get help right away if:  You have trouble breathing. Summary  For several hours after your procedure, you may  feel sleepy and have poor judgment.  Have a responsible adult stay with you for at least 24 hours or until you are awake and alert. This information is not intended to replace advice given to you by your health care provider. Make sure you discuss any questions you have with your health care provider. Document Revised: 11/30/2017 Document Reviewed: 12/23/2015 Elsevier Patient Education  [place].

## 2020-08-17 ENCOUNTER — Encounter (HOSPITAL_COMMUNITY): Payer: Self-pay

## 2020-08-17 ENCOUNTER — Encounter (HOSPITAL_COMMUNITY)
Admission: RE | Admit: 2020-08-17 | Discharge: 2020-08-17 | Disposition: A | Discharge status: Home / Self Care | Payer: Medicare Other | Source: Ambulatory Visit | Attending: Internal Medicine | Admitting: Internal Medicine

## 2020-08-17 ENCOUNTER — Other Ambulatory Visit: Payer: Self-pay

## 2020-08-17 ENCOUNTER — Other Ambulatory Visit (HOSPITAL_COMMUNITY)
Admission: RE | Admit: 2020-08-17 | Discharge: 2020-08-17 | Disposition: A | Discharge status: Home / Self Care | Payer: Medicare Other | Source: Ambulatory Visit | Attending: Internal Medicine | Admitting: Internal Medicine

## 2020-08-17 DIAGNOSIS — Z01812 Encounter for preprocedural laboratory examination: Secondary | ICD-10-CM | POA: Diagnosis not present

## 2020-08-17 DIAGNOSIS — Z20822 Contact with and (suspected) exposure to covid-19: Secondary | ICD-10-CM | POA: Diagnosis not present

## 2020-08-18 LAB — SARS CORONAVIRUS 2 (TAT 6-24 HRS): SARS Coronavirus 2: NEGATIVE

## 2020-08-21 ENCOUNTER — Ambulatory Visit (HOSPITAL_COMMUNITY)
Admission: RE | Admit: 2020-08-21 | Discharge: 2020-08-21 | Disposition: A | Discharge status: Home / Self Care | Payer: Medicare Other | Attending: Internal Medicine | Admitting: Internal Medicine

## 2020-08-21 ENCOUNTER — Ambulatory Visit (HOSPITAL_COMMUNITY): Payer: Medicare Other | Admitting: Anesthesiology

## 2020-08-21 ENCOUNTER — Encounter (HOSPITAL_COMMUNITY)
Admission: RE | Disposition: A | Discharge status: Home / Self Care | Payer: Self-pay | Source: Home / Self Care | Attending: Internal Medicine

## 2020-08-21 ENCOUNTER — Encounter (HOSPITAL_COMMUNITY): Payer: Self-pay

## 2020-08-21 DIAGNOSIS — Z79899 Other long term (current) drug therapy: Secondary | ICD-10-CM | POA: Diagnosis not present

## 2020-08-21 DIAGNOSIS — Z885 Allergy status to narcotic agent status: Secondary | ICD-10-CM | POA: Insufficient documentation

## 2020-08-21 DIAGNOSIS — Z79890 Hormone replacement therapy (postmenopausal): Secondary | ICD-10-CM | POA: Insufficient documentation

## 2020-08-21 DIAGNOSIS — I10 Essential (primary) hypertension: Secondary | ICD-10-CM | POA: Diagnosis not present

## 2020-08-21 DIAGNOSIS — Z8673 Personal history of transient ischemic attack (TIA), and cerebral infarction without residual deficits: Secondary | ICD-10-CM | POA: Insufficient documentation

## 2020-08-21 DIAGNOSIS — K2970 Gastritis, unspecified, without bleeding: Secondary | ICD-10-CM

## 2020-08-21 DIAGNOSIS — K222 Esophageal obstruction: Secondary | ICD-10-CM | POA: Diagnosis not present

## 2020-08-21 DIAGNOSIS — K209 Esophagitis, unspecified without bleeding: Secondary | ICD-10-CM | POA: Diagnosis not present

## 2020-08-21 DIAGNOSIS — R1310 Dysphagia, unspecified: Secondary | ICD-10-CM | POA: Diagnosis not present

## 2020-08-21 HISTORY — PX: BALLOON DILATION: SHX5330

## 2020-08-21 HISTORY — PX: ESOPHAGOGASTRODUODENOSCOPY (EGD) WITH PROPOFOL: SHX5813

## 2020-08-21 LAB — GLUCOSE, CAPILLARY
Glucose-Capillary: 96 mg/dL (ref 70–99)
Glucose-Capillary: 87 mg/dL (ref 70–99)

## 2020-08-21 SURGERY — ESOPHAGOGASTRODUODENOSCOPY (EGD) WITH PROPOFOL
Anesthesia: General

## 2020-08-21 MED ORDER — LIDOCAINE VISCOUS HCL 2 % MT SOLN
OROMUCOSAL | Status: AC
  Filled 2020-08-21: qty 15

## 2020-08-21 MED ORDER — LIDOCAINE HCL (CARDIAC) PF 100 MG/5ML IV SOSY
PREFILLED_SYRINGE | INTRAVENOUS | Status: DC | PRN
  Administered 2020-08-21: 40 mg via INTRAVENOUS

## 2020-08-21 MED ORDER — LIDOCAINE VISCOUS HCL 2 % MT SOLN
15.0000 mL | Freq: Once | OROMUCOSAL | Status: AC
  Administered 2020-08-21: 15 mL via OROMUCOSAL

## 2020-08-21 MED ORDER — PROPOFOL 10 MG/ML IV BOLUS
INTRAVENOUS | Status: DC | PRN
  Administered 2020-08-21: 150 ug/kg/min via INTRAVENOUS
  Administered 2020-08-21: 80 mg via INTRAVENOUS

## 2020-08-21 MED ORDER — LACTATED RINGERS IV SOLN: INTRAVENOUS | Status: DC | PRN

## 2020-08-21 MED ORDER — LACTATED RINGERS IV SOLN
Freq: Once | INTRAVENOUS | Status: AC
  Administered 2020-08-21: 1000 mL via INTRAVENOUS

## 2020-08-21 MED ORDER — PANTOPRAZOLE SODIUM 40 MG PO TBEC
40.0000 mg | DELAYED_RELEASE_TABLET | Freq: Every day | ORAL | 3 refills | Status: DC
Start: 2020-08-21 — End: 2020-11-13

## 2020-08-21 NOTE — Anesthesia Preprocedure Evaluation (Signed)
Anesthesia Evaluation  Patient identified by MRN, date of birth, ID band Patient awake    Reviewed: Allergy & Precautions, NPO status , Patient's Chart, lab work & pertinent test results  History of Anesthesia Complications (+) PONV and history of anesthetic complications  Airway Mallampati: II  TM Distance: >3 FB Neck ROM: Full    Dental  (+) Dental Advisory Given, Teeth Intact   Pulmonary asthma ,    Pulmonary exam normal breath sounds clear to auscultation       Cardiovascular hypertension, Pt. on medications  Rhythm:Regular Rate:Normal  30-Apr-2020 14:05:46 [facility] ROUTINE RECORD Sinus tachycardia   Neuro/Psych  Headaches, PSYCHIATRIC DISORDERS Anxiety Depression CVA, No Residual Symptoms    GI/Hepatic GERD  Medicated and Controlled,  Endo/Other  diabetes, Well Controlled, Type 2Hypothyroidism   Renal/GU Renal InsufficiencyRenal disease     Musculoskeletal  (+) Arthritis , Rheumatoid disorders,    Abdominal   Peds  Hematology  (+) Blood dyscrasia (thrombocytopenia ), anemia ,   Anesthesia Other Findings   Reproductive/Obstetrics                             Anesthesia Physical  Anesthesia Plan  ASA: III  Anesthesia Plan: General   Post-op Pain Management:    Induction: Intravenous  PONV Risk Score and Plan: TIVA  Airway Management Planned: Nasal Cannula, Simple Face Mask and Natural Airway  Additional Equipment:   Intra-op Plan:   Post-operative Plan:   Informed Consent: I have reviewed the patients History and Physical, chart, labs and discussed the procedure including the risks, benefits and alternatives for the proposed anesthesia with the patient or authorized representative who has indicated his/her understanding and acceptance.     Dental advisory given  Plan Discussed with: CRNA and Surgeon  Anesthesia Plan Comments:          Anesthesia Quick Evaluation

## 2020-08-21 NOTE — Transfer of Care (Signed)
Immediate Anesthesia Transfer of Care Note  Patient: Elizabeth Mcintyre  Procedure(s) Performed: ESOPHAGOGASTRODUODENOSCOPY (EGD) WITH PROPOFOL (N/A ) BALLOON DILATION (N/A )  Patient Location: PACU  Anesthesia Type:General  Level of Consciousness: awake, alert , oriented and patient cooperative  Airway & Oxygen Therapy: Patient Spontanous Breathing  Post-op Assessment: Report given to RN, Post -op Vital signs reviewed and stable and Patient moving all extremities  Post vital signs: Reviewed and stable  Last Vitals:  Vitals Value Taken Time  BP    Temp    Pulse 77 08/21/20 1307  Resp 16 08/21/20 1307  SpO2 98 % 08/21/20 1307  Vitals shown include unvalidated device data.  Last Pain:  Vitals:   08/21/20 1229  TempSrc: Oral  PainSc: 3       Patients Stated Pain Goal: 10 (19/75/88 3254)  Complications: No complications documented.

## 2020-08-21 NOTE — H&P (Signed)
Primary Care Physician:  Ma Hillock, DO Primary Gastroenterologist:  Dr. Abbey Chatters  Pre-Procedure History & Physical: HPI:  Elizabeth Mcintyre is a 79 y.o. female is here for an EGD for dysphagia and history of esophagitis. Currently taking Protonix 40 mg BID. No melena or hematochezia.  No abdominal pain or unintentional weight loss.  No change in bowel habits.    Past Medical History:  Diagnosis Date  . Asthma   . Bloating 04/26/2019  . 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 DIABETES MELLITUS, TYPE II 11/09/2007   diet control  . Diverticulosis 03/2011  . GERD (gastroesophageal reflux disease)    none recently  . GI bleed   . History of rheumatoid arthritis    during 30's, was treated.  . Hyperlipidemia   . Hypertension   . IBS (irritable bowel syndrome)   . Osteopenia 2017   Last  bone density 05/04/2017: -2.4  . PONV (postoperative nausea and vomiting)   . Stress incontinence   . Stroke [facility])    mini stroke - found on a CT scan    Past Surgical History:  Procedure Laterality Date  . ABDOMINAL HYSTERECTOMY    . BIOPSY  03/30/2020   Procedure: BIOPSY;  Surgeon: Ronald Lobo, MD;  Location: [facility];  Service: Endoscopy;;  . BIOPSY  05/02/2020   Procedure: BIOPSY;  Surgeon: Montez Morita, Quillian Quince, MD;  Location: [facility];  Service: Gastroenterology;;  . CARDIAC CATHETERIZATION     X 2, last one in 1998  . CHOLECYSTECTOMY N/A 04/13/2017   Procedure: LAPAROSCOPIC CHOLECYSTECTOMY;  Surgeon: Aviva Signs, MD;  Location: [facility] ORS;  Service: General;  Laterality: N/A;  . COLONOSCOPY    . COLONOSCOPY  May 2012   Dr. Olevia Perches: mild diverticulosis, otherwise normal.   . COLONOSCOPY WITH PROPOFOL N/A 05/02/2020   Dr. Jenetta Downer: 8 mm polyp removed from the ascending colon, 2 mm polyp removed from the ascending colon.  Tubular adenomas.  Diverticulosis.  Mucosal ulceration in the sigmoid colon noted,  pathology consistent with ischemic colitis.  Marland Kitchen ESOPHAGOGASTRODUODENOSCOPY N/A 01/28/2015   Dr. Gala Romney: reflux esophagitis, Schatzki's ring not manipulated due to recent bleeding  . ESOPHAGOGASTRODUODENOSCOPY N/A 03/30/2015   Dr. Gala Romney: Schatzki's ring s/p Venia Minks dilation, previously noted esophageal ulcer completely healed  . ESOPHAGOGASTRODUODENOSCOPY N/A 03/30/2020   Buccini: Moderately severe erosive, circumferential, confluent esophagitis with no bleeding found 25 to 40 cm from incisors.  Nonobstructing and mild Schatzki ring, there were also multiple distal esophageal rings noted, minimal hiatal hernia.  . IR IMAGING GUIDED PORT INSERTION  03/13/2020  . LYMPH NODE BIOPSY Left 03/20/2020   Procedure: LEFT POSTERIOR CERVICAL LYMPH NODE BIOPSY;  Surgeon: Georganna Skeans, MD;  Location: [facility];  Service: General;  Laterality: Left;  Marland Kitchen MALONEY DILATION N/A 03/30/2015   Procedure: Venia Minks DILATION;  Surgeon: Daneil Dolin, MD;  [facility];  Service: Endoscopy;  Laterality: N/A;  . POLYPECTOMY  05/02/2020   Procedure: POLYPECTOMY;  Surgeon: Harvel Quale, MD;  [facility];  Service: Gastroenterology;;    Prior to Admission medications   Medication Sig Start Date End Date Taking? Authorizing Provider  acetaminophen (TYLENOL) 325 MG tablet Take 325 mg by mouth every 6 (six) hours as needed for moderate pain.    Yes [provider]  allopurinol (ZYLOPRIM) 300 MG tablet Take 1 tablet (300 mg total) by mouth  daily. 03/07/20  Yes Orson Slick, MD  cetirizine (ZYRTEC) 10 MG tablet Take 10 mg by mouth at bedtime.   Yes [provider]  Cholecalciferol (VITAMIN D3) 25 MCG (1000 UT) CAPS Take 1,000 Units by mouth daily.    Yes [provider]  escitalopram (LEXAPRO) 10 MG tablet Take 1 tablet (10 mg total) by mouth daily. 08/14/20  Yes Kuneff, Renee A, DO  levothyroxine (SYNTHROID) 75 MCG tablet Take 1 tablet (75 mcg total) by mouth daily.  06/09/19  Yes Kuneff, Renee A, DO  lidocaine-prilocaine (EMLA) cream Apply 1 application topically as needed. Patient taking differently: Apply 1 application topically as needed (port access).  04/12/20  Yes Orson Slick, MD  magic mouthwash w/lidocaine SOLN Take 5 mLs by mouth 4 (four) times daily as needed for mouth pain. 05/07/20  Yes Orson Slick, MD  potassium chloride SA (KLOR-CON) 20 MEQ tablet Take 1 tablet (20 mEq total) by mouth daily. 07/13/20  Yes Orson Slick, MD  sucralfate (CARAFATE) 1 g tablet Take 1 g by mouth 4 (four) times daily -  with meals and at bedtime.   Yes [provider]  sucralfate (CARAFATE) 1 GM/10ML suspension Take 10 mLs (1 g total) by mouth 4 (four) times daily as needed (for throat/mouth burning). Patient taking differently: Take 1 g by mouth 4 (four) times daily.  04/18/20  Yes Carlis Stable, NP  vitamin B-12 (CYANOCOBALAMIN) 1000 MCG tablet Take 1,000 mcg by mouth daily.    Yes [provider]  albuterol (VENTOLIN HFA) 108 (90 Base) MCG/ACT inhaler Inhale 2 puffs into the lungs every 6 (six) hours as needed for wheezing. 08/14/20 08/14/21  Kuneff, Renee A, DO  amLODipine (NORVASC) 10 MG tablet Take 1 tablet (10 mg total) by mouth daily. 08/14/20   Kuneff, Renee A, DO  loperamide (IMODIUM) 2 MG capsule Take 1 capsule (2 mg total) by mouth every 6 (six) hours as needed for diarrhea or loose stools. Patient not taking: Reported on 08/15/2020 05/05/20   Kathie Dike, MD  pantoprazole (PROTONIX) 40 MG tablet Take 1 tablet (40 mg total) by mouth 2 (two) times daily before a meal. Patient not taking: Reported on 08/15/2020 07/11/20   Mahala Menghini, PA-C  rOPINIRole (REQUIP) 0.25 MG tablet Take 1 tablet (0.25 mg total) by mouth at bedtime. Patient taking differently: Take 0.25 mg by mouth at bedtime as needed (restless legs).  08/14/20   Kuneff, Renee A, DO    Allergies as of 07/11/2020 - Review Complete 07/11/2020  Allergen Reaction Noted   . Codeine phosphate Nausea And Vomiting and Rash     Family History  Problem Relation Age of Onset  . Heart disease Mother   . Osteoarthritis Mother   . Sudden death Father   . Single kidney Father   . Other Father        h/o severe MVA injuries  . Hyperlipidemia Sister   . Other Daughter        Myalgias  . Fibromyalgia Daughter   . Allergies Daughter   . Heart disease Maternal Grandfather   . Sudden death Paternal Grandmother   . Diabetes Paternal Grandfather   . Heart disease Daughter   . Other Daughter        palpitations  . Pulmonary fibrosis Maternal Aunt   . Cancer Paternal Uncle   . Pulmonary fibrosis Maternal Aunt   . Colon cancer Neg Hx     Social  History   Socioeconomic History  . Marital status: Widowed    Spouse name: Not on file  . Number of children: 2  . Years of education: Not on file  . Highest education level: Not on file  Occupational History  . Occupation: retired  Tobacco Use  . Smoking status: Never Smoker  . Smokeless tobacco: Never Used  Vaping Use  . Vaping Use: Never used  Substance and Sexual Activity  . Alcohol use: No  . Drug use: No  . Sexual activity: Never  Other Topics Concern  . Not on file  Social History Narrative   Ms. Lehtinen is widowed. Her young grandson lives with her, for whom she shares custody with her daughter, the son's aunt.     Social Determinants of Health   Financial Resource Strain:   . Difficulty of Paying Living Expenses: Not on file  Food Insecurity:   . Worried About Charity fundraiser in the Last Year: Not on file  . Ran Out of Food in the Last Year: Not on file  Transportation Needs:   . Lack of Transportation (Medical): Not on file  . Lack of Transportation (Non-Medical): Not on file  Physical Activity:   . Days of Exercise per Week: Not on file  . Minutes of Exercise per Session: Not on file  Stress:   . Feeling of Stress : Not on file  Social Connections:   . Frequency of Communication  with Friends and Family: Not on file  . Frequency of Social Gatherings with Friends and Family: Not on file  . Attends Religious Services: Not on file  . Active Member of Clubs or Organizations: Not on file  . Attends Archivist Meetings: Not on file  . Marital Status: Not on file  Intimate Partner Violence:   . Fear of Current or Ex-Partner: Not on file  . Emotionally Abused: Not on file  . Physically Abused: Not on file  . Sexually Abused: Not on file    Review of Systems: See HPI, otherwise negative ROS  Impression/Plan: Elizabeth Mcintyre is here for an EGD for dysphagia and history of esophagitis.   The risks of the procedure including infection, bleed, or perforation as well as benefits, limitations, alternatives and imponderables have been reviewed with the patient. Questions have been answered. All parties agreeable.

## 2020-08-21 NOTE — Anesthesia Postprocedure Evaluation (Signed)
Anesthesia Post Note  Patient: Elizabeth Mcintyre  Procedure(s) Performed: ESOPHAGOGASTRODUODENOSCOPY (EGD) WITH PROPOFOL (N/A ) BALLOON DILATION (N/A )  Patient location during evaluation: PACU Anesthesia Type: General Level of consciousness: awake, oriented, awake and alert and patient cooperative Pain management: pain level controlled Vital Signs Assessment: post-procedure vital signs reviewed and stable Respiratory status: spontaneous breathing, respiratory function stable and nonlabored ventilation Cardiovascular status: blood pressure returned to baseline and stable Postop Assessment: no headache and no backache Anesthetic complications: no   No complications documented.   Last Vitals:  Vitals:   08/21/20 1229  BP: (!) 165/75  Pulse: 70  Resp: 16  Temp: (!) 36.4 C  SpO2: 97%    Last Pain:  Vitals:   08/21/20 1229  TempSrc: Oral  PainSc: 3                  Tacy Learn

## 2020-08-21 NOTE — Discharge Instructions (Addendum)
Upper Endoscopy, Adult, Care After  This sheet gives you information about how to care for yourself after your procedure. Your health care provider may also give you more specific instructions. If you have problems or questions, contact your health care provider.  What can I expect after the procedure?  After the procedure, it is common to have:  · A sore throat.  · Mild stomach pain or discomfort.  · Bloating.  · Nausea.  Follow these instructions at home:    · Follow instructions from your health care provider about what to eat or drink after your procedure.  · Return to your normal activities as told by your health care provider. Ask your health care provider what activities are safe for you.  · Take over-the-counter and prescription medicines only as told by your health care provider.  · Do not drive for 24 hours if you were given a sedative during your procedure.  · Keep all follow-up visits as told by your health care provider. This is important.  Contact a health care provider if you have:  · A sore throat that lasts longer than one day.  · Trouble swallowing.  Get help right away if:  · You vomit blood or your vomit looks like coffee grounds.  · You have:  ? A fever.  ? Bloody, black, or tarry stools.  ? A severe sore throat or you cannot swallow.  ? Difficulty breathing.  ? Severe pain in your chest or abdomen.  Summary  · After the procedure, it is common to have a sore throat, mild stomach discomfort, bloating, and nausea.  · Do not drive for 24 hours if you were given a sedative during the procedure.  · Follow instructions from your health care provider about what to eat or drink after your procedure.  · Return to your normal activities as told by your health care provider.  This information is not intended to replace advice given to you by your health care provider. Make sure you discuss any questions you have with your health care provider.  Document Revised: 02/23/2018 Document Reviewed:  02/01/2018  Elsevier Patient Education © 2020 Elsevier Inc.

## 2020-08-21 NOTE — Op Note (Signed)
[facility] Patient Name: Elizabeth Mcintyre Procedure Date: 08/21/2020 12:47 PM MRN: [id number] Date of Birth: 1941/05/09 Attending MD: Elon Alas. Abbey Chatters DO CSN: [id number] Age: 79 Admit Type: Outpatient Procedure:                Upper GI endoscopy Indications:              Dysphagia, Follow-up of esophagitis Providers:                Elon Alas. Abbey Chatters, DO, Janeece Riggers, RN, Nelma Rothman,                            Technician Referring MD:              Medicines:                See the Anesthesia note for documentation of the                            administered medications Complications:            No immediate complications. Estimated Blood Loss:     Estimated blood loss was minimal. Procedure:                Pre-Anesthesia Assessment:                           - The anesthesia plan was to use monitored                            anesthesia care (MAC).                           After obtaining informed consent, the endoscope was                            passed under direct vision. Throughout the                            procedure, the patient's blood pressure, pulse, and                            oxygen saturations were monitored continuously. The                            GIF-H190 ([id number]) scope was introduced through the                            mouth, and advanced to the duodenal bulb. The upper                            GI endoscopy was accomplished without difficulty.                            The patient tolerated the procedure well. Scope In: 12:55:20 PM Scope Out: 1:03:36 PM Total Procedure Duration: 0 hours 8 minutes 16 seconds  Findings:      There is no endoscopic evidence of esophagitis in  the entire esophagus.      Multiple low-grade of narrowing Schatzki ring was found in the lower       third of the esophagus. A TTS dilator was passed through the scope.       Dilation with a 12-13.5-15 mm balloon dilator was performed to 15 mm.       The dilation site  was examined and showed mild mucosal disruption and       moderate improvement in luminal narrowing.      Diffuse mild inflammation characterized by erythema was found in the       entire examined stomach.      The duodenal bulb, first portion of the duodenum and second portion of       the duodenum were normal. Impression:               - Low-grade of narrowing Schatzki ring. Dilated.                           - Gastritis.                           - Normal duodenal bulb, first portion of the                            duodenum and second portion of the duodenum.                           - No specimens collected. Moderate Sedation:      Per Anesthesia Care Recommendation:           - Patient has a contact number available for                            emergencies. The signs and symptoms of potential                            delayed complications were discussed with the                            patient. Return to normal activities tomorrow.                            Written discharge instructions were provided to the                            patient.                           - Resume previous diet.                           - Continue present medications. Decrease Protonix                            to once daily.                           - Repeat upper endoscopy in 8 weeks for  retreatment.                           - Return to GI clinic after studies are complete. Procedure Code(s):        --- Professional ---                           [phone], Esophagogastroduodenoscopy, flexible,                            transoral; with transendoscopic balloon dilation of                            esophagus (less than 30 mm diameter) Diagnosis Code(s):        --- Professional ---                           K22.2, Esophageal obstruction                           K29.70, Gastritis, unspecified, without bleeding                           R13.10, Dysphagia, unspecified                            K20.90, Esophagitis, unspecified without bleeding CPT copyright 2019 American Medical Association. All rights reserved. The codes documented in this report are preliminary and upon coder review may  be revised to meet current compliance requirements. Elon Alas. Abbey Chatters, DO [place] Abbey Chatters, DO 08/21/2020 1:11:07 PM This report has been signed electronically. Number of Addenda: 0

## 2020-08-27 ENCOUNTER — Encounter (HOSPITAL_COMMUNITY): Payer: Self-pay | Admitting: Internal Medicine

## 2020-08-30 ENCOUNTER — Inpatient Hospital Stay: Payer: Medicare Other

## 2020-08-30 ENCOUNTER — Inpatient Hospital Stay: Payer: Medicare Other | Attending: Hematology and Oncology | Admitting: Hematology and Oncology

## 2020-08-30 ENCOUNTER — Other Ambulatory Visit: Payer: Self-pay | Admitting: Hematology and Oncology

## 2020-08-30 ENCOUNTER — Other Ambulatory Visit: Payer: Self-pay

## 2020-08-30 ENCOUNTER — Encounter: Payer: Self-pay | Admitting: Hematology and Oncology

## 2020-08-30 VITALS — BP 160/79 | HR 82 | Temp 97.5°F | Resp 16 | Ht 62.0 in | Wt 122.8 lb

## 2020-08-30 DIAGNOSIS — C8580 Other specified types of non-Hodgkin lymphoma, unspecified site: Secondary | ICD-10-CM | POA: Diagnosis not present

## 2020-08-30 DIAGNOSIS — Z95828 Presence of other vascular implants and grafts: Secondary | ICD-10-CM

## 2020-08-30 DIAGNOSIS — C8514 Unspecified B-cell lymphoma, lymph nodes of axilla and upper limb: Secondary | ICD-10-CM | POA: Insufficient documentation

## 2020-08-30 LAB — CMP (CANCER CENTER ONLY)
ALT: 11 U/L (ref 0–44)
AST: 19 U/L (ref 15–41)
Albumin: 3.9 g/dL (ref 3.5–5.0)
Alkaline Phosphatase: 61 U/L (ref 38–126)
Anion gap: 9 (ref 5–15)
BUN: 13 mg/dL (ref 8–23)
CO2: 25 mmol/L (ref 22–32)
Calcium: 9.3 mg/dL (ref 8.9–10.3)
Chloride: 105 mmol/L (ref 98–111)
Creatinine: 1.33 mg/dL — ABNORMAL HIGH (ref 0.44–1.00)
GFR, Estimated: 41 mL/min — ABNORMAL LOW (ref >60)
Glucose, Bld: 138 mg/dL — ABNORMAL HIGH (ref 70–99)
Potassium: 2.9 mmol/L — ABNORMAL LOW (ref 3.5–5.1)
Sodium: 139 mmol/L (ref 135–145)
Total Bilirubin: 0.4 mg/dL (ref 0.3–1.2)
Total Protein: 6.7 g/dL (ref 6.5–8.1)

## 2020-08-30 LAB — CBC WITH DIFFERENTIAL (CANCER CENTER ONLY)
Abs Immature Granulocytes: 0.02 10*3/uL (ref 0.00–0.07)
Basophils Absolute: 0 10*3/uL (ref 0.0–0.1)
Basophils Relative: 1 %
Eosinophils Absolute: 0.2 10*3/uL (ref 0.0–0.5)
Eosinophils Relative: 5 %
HCT: 35.5 % — ABNORMAL LOW (ref 36.0–46.0)
Hemoglobin: 12.4 g/dL (ref 12.0–15.0)
Immature Granulocytes: 0 %
Lymphocytes Relative: 33 %
Lymphs Abs: 1.5 10*3/uL (ref 0.7–4.0)
MCH: 30.6 pg (ref 26.0–34.0)
MCHC: 34.9 g/dL (ref 30.0–36.0)
MCV: 87.7 fL (ref 80.0–100.0)
Monocytes Absolute: 0.4 10*3/uL (ref 0.1–1.0)
Monocytes Relative: 9 %
Neutro Abs: 2.3 10*3/uL (ref 1.7–7.7)
Neutrophils Relative %: 52 %
Platelet Count: 179 10*3/uL (ref 150–400)
RBC: 4.05 MIL/uL (ref 3.87–5.11)
RDW: 13.2 % (ref 11.5–15.5)
WBC Count: 4.5 10*3/uL (ref 4.0–10.5)
nRBC: 0 % (ref 0.0–0.2)

## 2020-08-30 LAB — LACTATE DEHYDROGENASE: LDH: 353 U/L — ABNORMAL HIGH (ref 98–192)

## 2020-08-30 MED ORDER — SODIUM CHLORIDE 0.9% FLUSH
10.0000 mL | INTRAVENOUS | Status: DC | PRN
  Administered 2020-08-30: 10 mL
  Filled 2020-08-30: qty 10

## 2020-08-30 MED ORDER — HEPARIN SOD (PORK) LOCK FLUSH 100 UNIT/ML IV SOLN
500.0000 [IU] | Freq: Once | INTRAVENOUS | Status: AC | PRN
  Administered 2020-08-30: 500 [IU]
  Filled 2020-08-30: qty 5

## 2020-08-30 NOTE — Progress Notes (Signed)
[place] [phone]   [phone]  PROGRESS NOTE  Patient Care Team: Ma Hillock, DO as PCP - General (Family Medicine) Danie Binder, MD (Inactive) as Consulting Physician (Gastroenterology) Gala Romney Cristopher Estimable, MD as Consulting Physician (Gastroenterology) Minus Breeding, MD as Consulting Physician (Cardiology) Annitta Needs, NP (Gastroenterology) Carlis Stable, NP as Nurse Practitioner (Gastroenterology) Montez Morita, Quillian Quince, MD as Consulting Physician (Gastroenterology)  Hematological/Oncological History  # Low Grade Marginal Zone Lymphoma, Stage III 1) 06/06/2019: patient underwent a diagnostic mammogram of the left breast. Calcifications noted in the left breast, recommended 6 month f/u imaging. 2) 02/02/2020: bilateral diagnostic mammogram showedabnormal enlarged lymph nodes with cortical thickening. The largest of these measures 2.2 x 1.7 x 2.0 cm. 3) 02/02/2020: US guided biopsy of left axillary lymph nodes performed. 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 4) 02/16/2020: establish care with Dr. Lorenso Courier 5) 02/29/2020: PET CT scan performed, showed hypermetabolic lymph nodes identified in the posterior left neck, both axillary/subpectoral regions, mediastinum, hila, right external iliac chain, and right groin. Massive splenomegaly of 17.8 cm noted as well.  6) 03/20/2020: excisional biopsy of left posterior cervical lymph node. Biopsy confirmed most likely low grade marginal zone lymphoma.   7) 7/14-7/15/2021: Cycle 1 Day 1 of Rituximab/Bendamycin 8) 03/30/2020-04/01/2020: admitted inpatient for hematemesis. Noted to have marked esophagitis on EGD.  9) 04/25/2020: IV feraheme 510mg  administered 10) 04/26/2020: Cycle 2 Day 1 of  Rituximab/Bendamycin 11) 05/10/2020: HOLD R-Benda in setting of colitis, GI bleed, and intolerance to therapy. Plan to start monotherapy Rituximab once patient has rebounded.  12) 07/06/2020: Cycle 1 Week 1 of monotherapy rituximab 13) 07/30/2020: Cycle 1 Week 3 of monotherapy rituximab 14) 08/06/2020: Completed Cycle 1 of monotherapy rituximab  Interval History:  Elizabeth Mcintyre 79 y.o. 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 The patient's last visit was on 07/30/2020 for continued f/u. In interim she has completed all 4 doses of monotherapy rituximab.   On exam today Elizabeth Mcintyre notes that she has been well in the interim since her last visit.  She does continue to have some fatigue and that she feels like she is "drained" and has less energy than usual.  She reports that she "cannot do much".  As she has to stop due to fatigue.  She did undergo esophageal dilation with GI and is that she is swallowing better since this occurred.  Unfortunately she does have to have a revision of this done in approximately 6 weeks time.  She denies having any fevers, chills, sweats, nausea, vomiting or diarrhea.   A full 10 point ROS is listed below.  MEDICAL HISTORY:  Past Medical History:  Diagnosis Date  . Asthma   . Bloating 04/26/2019  . Cancer ([place]) 2021   Lymphoma  . 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 DIABETES MELLITUS, TYPE II 11/09/2007   diet control  . Diverticulosis 03/2011  . GERD (gastroesophageal reflux disease)    none recently  . GI bleed   . History of rheumatoid arthritis    during 30's, was treated.  . Hyperlipidemia   . Hypertension   . IBS (irritable bowel syndrome)   . Osteopenia 2017   Last  bone  density 05/04/2017: -2.4  . PONV (postoperative nausea and vomiting)   . Stress incontinence   . Stroke [facility])    mini stroke - found on a CT scan     SURGICAL HISTORY: Past Surgical History:  Procedure Laterality Date  . ABDOMINAL HYSTERECTOMY    . BALLOON DILATION N/A 08/21/2020   Procedure: BALLOON DILATION;  Surgeon: Eloise Harman, DO;  [facility];  Service: Endoscopy;  Laterality: N/A;  . BIOPSY  03/30/2020   Procedure: BIOPSY;  Surgeon: Ronald Lobo, MD;  Location: [facility];  Service: Endoscopy;;  . BIOPSY  05/02/2020   Procedure: BIOPSY;  Surgeon: Harvel Quale, MD;  Location: [facility];  Service: Gastroenterology;;  . CARDIAC CATHETERIZATION     X 2, last one in 1998  . CHOLECYSTECTOMY N/A 04/13/2017   Procedure: LAPAROSCOPIC CHOLECYSTECTOMY;  Surgeon: Aviva Signs, MD;  Location: [facility];  Service: General;  Laterality: N/A;  . COLONOSCOPY    . COLONOSCOPY  May 2012   Dr. Olevia Perches: mild diverticulosis, otherwise normal.   . COLONOSCOPY WITH PROPOFOL N/A 05/02/2020   Dr. Jenetta Downer: 8 mm polyp removed from the ascending colon, 2 mm polyp removed from the ascending colon.  Tubular adenomas.  Diverticulosis.  Mucosal ulceration in the sigmoid colon noted, pathology consistent with ischemic colitis.  Marland Kitchen ESOPHAGOGASTRODUODENOSCOPY N/A 01/28/2015   Dr. Gala Romney: reflux esophagitis, Schatzki's ring not manipulated due to recent bleeding  . ESOPHAGOGASTRODUODENOSCOPY N/A 03/30/2015   Dr. Gala Romney: Schatzki's ring s/p Venia Minks dilation, previously noted esophageal ulcer completely healed  . ESOPHAGOGASTRODUODENOSCOPY N/A 03/30/2020   Buccini: Moderately severe erosive, circumferential, confluent esophagitis with no bleeding found 25 to 40 cm from incisors.  Nonobstructing and mild Schatzki ring, there were also multiple distal esophageal rings noted, minimal hiatal hernia.  Marland Kitchen ESOPHAGOGASTRODUODENOSCOPY (EGD) WITH PROPOFOL N/A 08/21/2020   Procedure: ESOPHAGOGASTRODUODENOSCOPY (EGD) WITH PROPOFOL;  Surgeon: Eloise Harman, DO;  [facility];  Service: Endoscopy;  Laterality: N/A;  2:15pm  . IR  IMAGING GUIDED PORT INSERTION  03/13/2020  . LYMPH NODE BIOPSY Left 03/20/2020   Procedure: LEFT POSTERIOR CERVICAL LYMPH NODE BIOPSY;  Surgeon: Georganna Skeans, MD;  Location: [facility];  Service: General;  Laterality: Left;  Marland Kitchen MALONEY DILATION N/A 03/30/2015   Procedure: Venia Minks DILATION;  Surgeon: Daneil Dolin, MD;  [facility];  Service: Endoscopy;  Laterality: N/A;  . POLYPECTOMY  05/02/2020   Procedure: POLYPECTOMY;  Surgeon: Harvel Quale, MD;  [facility];  Service: Gastroenterology;;    SOCIAL HISTORY: Social History   Socioeconomic History  . Marital status: Widowed    Spouse name: Not on file  . Number of children: 2  . Years of education: Not on file  . Highest education level: Not on file  Occupational History  . Occupation: retired  Tobacco Use  . Smoking status: Never Smoker  . Smokeless tobacco: Never Used  Vaping Use  . Vaping Use: Never used  Substance and Sexual Activity  . Alcohol use: No  . Drug use: No  . Sexual activity: Never  Other Topics Concern  . Not on file  Social History Narrative   Elizabeth Mcintyre is widowed. Her young grandson lives with her, for whom she shares custody with her daughter, the son's aunt.     Social Determinants of Health   Financial Resource Strain: Not on file  Food Insecurity: Not on file  Transportation Needs: Not on file  Physical Activity: Not on file  Stress: Not on file  Social Connections: Not on file  Intimate Partner Violence: Not on file    FAMILY HISTORY: Family History  Problem Relation Age of Onset  . Heart disease Mother   . Osteoarthritis Mother   . Sudden death Father   . Single kidney Father   . Other Father        h/o severe MVA injuries  . Hyperlipidemia Sister   . Other Daughter        Myalgias  . Fibromyalgia Daughter   . Allergies Daughter   . Heart disease Maternal Grandfather   . Sudden death Paternal Grandmother   . Diabetes Paternal Grandfather   . Heart  disease Daughter   . Other Daughter        palpitations  . Pulmonary fibrosis Maternal Aunt   . Cancer Paternal Uncle   . Pulmonary fibrosis Maternal Aunt   . Colon cancer Neg Hx     ALLERGIES:  is allergic to codeine phosphate.  MEDICATIONS:  Current Outpatient Medications  Medication Sig Dispense Refill  . acetaminophen (TYLENOL) 325 MG tablet Take 325 mg by mouth every 6 (six) hours as needed for moderate pain.     Marland Kitchen albuterol (VENTOLIN HFA) 108 (90 Base) MCG/ACT inhaler Inhale 2 puffs into the lungs every 6 (six) hours as needed for wheezing. 8.5 g 2  . allopurinol (ZYLOPRIM) 300 MG tablet Take 1 tablet (300 mg total) by mouth daily. 30 tablet 3  . amLODipine (NORVASC) 10 MG tablet Take 1 tablet (10 mg total) by mouth daily. 90 tablet 1  . cetirizine (ZYRTEC) 10 MG tablet Take 10 mg by mouth at bedtime.    . Cholecalciferol (VITAMIN D3) 25 MCG (1000 UT) CAPS Take 1,000 Units by mouth daily.     Marland Kitchen escitalopram (LEXAPRO) 10 MG tablet Take 1 tablet (10 mg total) by mouth daily. (Patient not taking: Reported on 08/30/2020) 90 tablet 0  . levothyroxine (SYNTHROID) 75 MCG tablet Take 1 tablet (75 mcg total) by mouth daily. 90 tablet 3  . lidocaine-prilocaine (EMLA) cream Apply 1 application topically as needed. (Patient taking differently: Apply 1 application topically as needed (port access). ) 30 g 1  . magic mouthwash w/lidocaine SOLN Take 5 mLs by mouth 4 (four) times daily as needed for mouth pain. (Patient not taking: Reported on 08/30/2020) 260 mL 1  . pantoprazole (PROTONIX) 40 MG tablet Take 1 tablet (40 mg total) by mouth daily. 30 tablet 3  . potassium chloride SA (KLOR-CON) 20 MEQ tablet Take 1 tablet (20 mEq total) by mouth daily. 21 tablet 0  . rOPINIRole (REQUIP) 0.25 MG tablet Take 1 tablet (0.25 mg total) by mouth at bedtime. (Patient taking differently: Take 0.25 mg by mouth at bedtime as needed (restless legs). ) 90 tablet 1  . sucralfate (CARAFATE) 1 g tablet Take 1 g by  mouth 4 (four) times daily -  with meals and at bedtime.    . sucralfate (CARAFATE) 1 GM/10ML suspension Take 10 mLs (1 g total) by mouth 4 (four) times daily as needed (for throat/mouth burning). (Patient taking differently: Take 1 g by mouth 4 (four) times daily. ) 420 mL 1  . vitamin B-12 (CYANOCOBALAMIN) 1000 MCG tablet Take 1,000 mcg by mouth daily.      No current facility-administered medications for this visit.   Facility-Administered Medications Ordered in Other Visits  Medication Dose Route Frequency Provider Last Rate Last Admin  . sodium chloride flush (NS) 0.9 % injection 10  mL  10 mL Intracatheter PRN Orson Slick, MD   10 mL at 08/30/20 1517    REVIEW OF SYSTEMS:   Constitutional: ( - ) fevers, ( - )  chills , ( - ) night sweats Eyes: ( - ) blurriness of vision, ( - ) double vision, ( - ) watery eyes Ears, nose, mouth, throat, and face: ( - ) mucositis, ( - ) sore throat Respiratory: ( - ) cough, ( - ) dyspnea, ( - ) wheezes Cardiovascular: ( - ) palpitation, ( - ) chest discomfort, ( - ) lower extremity swelling Gastrointestinal:  ( - ) nausea, ( - ) heartburn, ( - ) change in bowel habits Skin: ( - ) abnormal skin rashes Lymphatics: ( - ) new lymphadenopathy, ( - ) easy bruising Neurological: ( - ) numbness, ( - ) tingling, ( - ) new weaknesses Behavioral/Psych: ( - ) mood change, ( - ) new changes  All other systems were reviewed with the patient and are negative.  PHYSICAL EXAMINATION: ECOG PERFORMANCE STATUS: 1 - Symptomatic but completely ambulatory  Vitals:   08/30/20 1528  BP: (!) 160/79  Pulse: 82  Resp: 16  Temp: (!) 97.5 F (36.4 C)  SpO2: 100%   Filed Weights   08/30/20 1528  Weight: 122 lb 12.8 oz (55.7 kg)    GENERAL: well appearing elderly Caucasian female in NAD  SKIN: skin color, texture, turgor are normal. Small dime sized dry skin lesions on chest, about 5 in number. Improved from prior EYES: conjunctiva are pink and non-injected,  sclera clear NECK: supple, non-tender.  LUNGS: clear to auscultation and percussion with normal breathing effort HEART: regular rate & rhythm and no murmurs and no lower extremity edema Musculoskeletal: no cyanosis of digits and no clubbing  PSYCH: alert & oriented x 3, fluent speech NEURO: no focal motor/sensory deficits  LABORATORY DATA:  I have reviewed the data as listed CBC Latest Ref Rng & Units 08/30/2020 07/27/2020 07/20/2020  WBC 4.0 - 10.5 K/uL 4.5 8.4 6.4  Hemoglobin 12.0 - 15.0 g/dL 12.4 12.7 12.4  Hematocrit 36.0 - 46.0 % 35.5(L) 35.1(L) 34.7(L)  Platelets 150 - 400 K/uL 179 145(L) 155    CMP Latest Ref Rng & Units 08/30/2020 07/27/2020 07/20/2020  Glucose 70 - 99 mg/dL 138(H) 107(H) 111(H)  BUN 8 - 23 mg/dL 13 19 25(H)  Creatinine 0.44 - 1.00 mg/dL 1.33(H) 1.17(H) 1.09(H)  Sodium 135 - 145 mmol/L 139 139 138  Potassium 3.5 - 5.1 mmol/L 2.9(L) 3.3(L) 3.3(L)  Chloride 98 - 111 mmol/L 105 107 104  CO2 22 - 32 mmol/L 25 24 25   Calcium 8.9 - 10.3 mg/dL 9.3 9.7 9.6  Total Protein 6.5 - 8.1 g/dL 6.7 6.7 6.6  Total Bilirubin 0.3 - 1.2 mg/dL 0.4 0.6 0.5  Alkaline Phos 38 - 126 U/L 61 58 62  AST 15 - 41 U/L 19 13(L) 14(L)  ALT 0 - 44 U/L 11 8 11     RADIOGRAPHIC STUDIES: I have personally reviewed the radiological images as listed and agreed with the findings in the report: FDG avid spleen and axillary lymph nodes. Involvement of cervical and inguinal nodes as well.  No results found.  ASSESSMENT & PLAN LYSA LIVENGOOD 79 y.o. 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  On exam today Elizabeth Mcintyre tolerated monotherapy rituximab well.  She is having fatigue, but has otherwise been at her  baseline health and weight.  Otherwise Elizabeth Mcintyre denies having any issues with fevers, chills, sweats, nausea, or vomiting.    For her current diagonsis I recommend a rituximab monotherapy given her  intolerance of BR x 2 cycles. This regimen would consist of rituximab 375 mg/m2 IV q weekly x 4 weeks. During her scans on 8/16 - 05/03/2020 she was shown to have marked response to therapy with normalization of spleen size and resolution of lymphadenopathy. Once a repeat PET CT scan is ordered we can consider proceeding with q 2 months maintenance/consolidation rituximab.   GELF Criteria: 1 (splenomegaly). Indication for treatment  # Low Grade Marginal Zone Lymphoma, Stage III -- discontinued bendamustine + rituximab as patient is unable to tolerate this treatment. Will pursue rituximab monotherapy instead. Started therapy with Cycle 1 Day 1 on 03/28/2020 BR, plan to decrease to Ritux alone q weekly x 4 starting 07/06/2020. --patient completed excisional lymph node biopsy to confirm the diagnosis. Pathological exam of the lymph node confirms marginal zone lymphoma.  --PET CT scan confirms stage III disease. Patient meets GELF criteria for treatment (splenomegaly). She did not have any B symptoms and her counts were stable at diagnosis, with some mild thrombocytopenia.  --Patient completed monotherapy rituximab on 08/06/2020.  --RTC pending results of interval PET CT scan.   #Concern for GI Bleed, resolved #Hematemesis, resolved #Colitis, resolved --patient admitted on 03/30/2020 (day after Cycle 1 chemotherapy) with hematemesis --no overt GI bleeding noted on Upper GI evaluation -- no signs of bleeding since last visit.  --iron levels appeared low previously and patient was having symptoms of iron deficiency anemia including fatigue, restless leg, and dyspnea on exertion. -- Repeat labs today, Hgb continues rebounding nicely.   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   No  orders of the defined types were placed in this encounter.   All questions were answered. The patient knows to call the clinic with any problems, questions or concerns.  A total of more than 30 minutes were spent on this encounter and over half of that time was spent on counseling and coordination of care as outlined above.   Ledell Peoples, MD Department of Hematology/Oncology [place] [facility] Phone: [phone] Pager: [phone] Email: [email or web address]  08/30/2020 5:50 PM

## 2020-08-31 ENCOUNTER — Telehealth: Payer: Self-pay | Admitting: Hematology and Oncology

## 2020-08-31 NOTE — Telephone Encounter (Signed)
Scheduled per los. Called and spoke with patient. Confirmed appt 

## 2020-09-11 ENCOUNTER — Ambulatory Visit: Payer: Medicare Other | Admitting: Family Medicine

## 2020-09-12 ENCOUNTER — Telehealth: Payer: Self-pay | Admitting: Family Medicine

## 2020-09-12 ENCOUNTER — Ambulatory Visit (HOSPITAL_COMMUNITY)
Admission: RE | Admit: 2020-09-12 | Discharge: 2020-09-12 | Disposition: A | Discharge status: Home / Self Care | Payer: Medicare Other | Source: Ambulatory Visit | Attending: Hematology and Oncology | Admitting: Hematology and Oncology

## 2020-09-12 ENCOUNTER — Other Ambulatory Visit: Payer: Self-pay

## 2020-09-12 DIAGNOSIS — I2510 Atherosclerotic heart disease of native coronary artery without angina pectoris: Secondary | ICD-10-CM | POA: Insufficient documentation

## 2020-09-12 DIAGNOSIS — I700 Atherosclerosis of aorta: Secondary | ICD-10-CM | POA: Diagnosis not present

## 2020-09-12 DIAGNOSIS — J439 Emphysema, unspecified: Secondary | ICD-10-CM | POA: Diagnosis not present

## 2020-09-12 DIAGNOSIS — N281 Cyst of kidney, acquired: Secondary | ICD-10-CM | POA: Diagnosis not present

## 2020-09-12 DIAGNOSIS — C8580 Other specified types of non-Hodgkin lymphoma, unspecified site: Secondary | ICD-10-CM | POA: Insufficient documentation

## 2020-09-12 DIAGNOSIS — C884 Extranodal marginal zone B-cell lymphoma of mucosa-associated lymphoid tissue [MALT-lymphoma]: Secondary | ICD-10-CM | POA: Diagnosis not present

## 2020-09-12 LAB — GLUCOSE, CAPILLARY: Glucose-Capillary: 107 mg/dL — ABNORMAL HIGH (ref 70–99)

## 2020-09-12 IMAGING — PT NM PET TUM IMG RESTAG (PS) SKULL BASE T - THIGH
1 of 8 series · 1 of 25 positions shown · non-contrast
Comparison: PET-CT [DATE]. Abdominopelvic CT [DATE] and
[DATE].

CLINICAL DATA: Subsequent treatment strategy for marginal zone
lymphoma.

EXAM:
NUCLEAR MEDICINE PET SKULL BASE TO THIGH
TECHNIQUE: 6.1 mCi F-18 FDG was injected intravenously. Full-ring PET imaging
was performed from the skull base to thigh after the radiotracer. CT
data was obtained and used for attenuation correction and anatomic
localization.
Fasting blood glucose: 107 mg/dl

[Series 4: ct sk_thigh 5.0 bf37 · axial · 5.0mm · 0.98mm/px · 1 of 209 slices shown]
[im 209/209  brain]
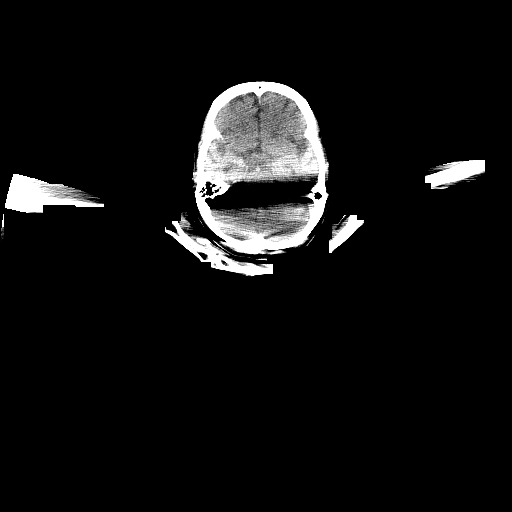

[1 of 25 positions shown; findings below may reference images not displayed]

FINDINGS: Mediastinal blood pool activity: SUV max

Liver activity: SUV max

NECK:

No hypermetabolic cervical lymph nodes are identified.Previously
demonstrated posterior left cervical hypermetabolic node has
resolved. No lesions of the pharyngeal mucosal space.

Incidental CT findings: none

CHEST:

Interval complete resolution of previously demonstrated left greater
than right axillary/subpectoral, mediastinal and hilar
hypermetabolic adenopathy. No hypermetabolic pulmonary activity or
suspicious nodularity.

Incidental CT findings: Right IJ Port-A-Cath extends to the superior
cavoatrial junction. Atherosclerosis of the aorta, great vessels and
coronary arteries. Mild pulmonary emphysematous changes and
dependent atelectasis bilaterally.

ABDOMEN/PELVIS:

There is no hypermetabolic activity within the liver, adrenal
glands, spleen or pancreas. The spleen is now normal in size without
hypermetabolic activity (SUV max 3.2). No residual or recurrent
hypermetabolic nodal activity. Specifically, the previously
demonstrated small hypermetabolic right common iliac and inguinal
lymph nodes have resolved.

Incidental CT findings: Status post cholecystectomy and
hysterectomy. Aortic and branch vessel atherosclerosis, small right
renal cyst and mild left renal cortical thinning are noted.
Previously demonstrated colonic wall thickening has resolved. There
is a stable small calcified right renal artery aneurysm.

SKELETON:

There is no hypermetabolic activity to suggest osseous metastatic
disease. Previously demonstrated small focus of hypermetabolic
activity in the L5 spinous process has resolved.

Incidental CT findings: none
IMPRESSION: 1. No residual hypermetabolic nodal activity, splenomegaly or
splenic hypermetabolic activity consistent with complete response to
therapy ([HOSPITAL] 1).
2. No new hypermetabolic activity.
3. Stable incidental findings, including Aortic Atherosclerosis
([C8]-[C8]).

## 2020-09-12 MED ORDER — FLUDEOXYGLUCOSE F - 18 (FDG) INJECTION
6.1000 | Freq: Once | INTRAVENOUS | Status: AC | PRN
  Administered 2020-09-12: 6.12 via INTRAVENOUS

## 2020-09-12 NOTE — Telephone Encounter (Signed)
Left message for patient to schedule Annual Wellness Visit.  Please schedule with Nurse Health Advisor Martha Stanley, RN at  [facility]  °

## 2020-09-13 ENCOUNTER — Encounter: Payer: Self-pay | Admitting: Internal Medicine

## 2020-09-13 ENCOUNTER — Telehealth: Payer: Self-pay | Admitting: *Deleted

## 2020-09-13 NOTE — Telephone Encounter (Signed)
-----   Message from Jaci Standard, MD sent at 09/12/2020 10:07 PM EST ----- Please let Elizabeth Mcintyre know that I have reviewed her PET scan. The result is an excellent response (Deauville Score 1) . This is a Complete Response to therapy. There is no evidence of active cancer on her PET Scan. We can discuss maintenance rituximab or continued observation with her at the next visit.  ----- Message ----- From: Interface, Rad Results In Sent: 09/12/2020   3:38 PM EST To: Jaci Standard, MD

## 2020-09-13 NOTE — Telephone Encounter (Signed)
TCT patient regarding results of her recent PET scan. No answer but was able to leave vm message on identified phone # Advised that her PET scan results were good-complete response to treatment. Advised that she can call back to discuss further or wait until her appt with Dr. Leonides Schanz on 10/05/20

## 2020-09-17 ENCOUNTER — Telehealth: Payer: Self-pay | Admitting: Family Medicine

## 2020-09-17 NOTE — Telephone Encounter (Signed)
Left message for patient to schedule Annual Wellness Visit.  Please schedule with Nurse Health Advisor Martha Stanley, RN at [facility]  °

## 2020-09-19 ENCOUNTER — Ambulatory Visit: Payer: Medicare Other | Admitting: Family Medicine

## 2020-10-04 ENCOUNTER — Telehealth: Payer: Self-pay | Admitting: Hematology and Oncology

## 2020-10-04 NOTE — Telephone Encounter (Signed)
Called pt per 1/20 sch msg - no answer . Left message for patient to call back to reschedule appt.

## 2020-10-05 ENCOUNTER — Other Ambulatory Visit: Payer: Medicare Other

## 2020-10-05 ENCOUNTER — Telehealth: Payer: Self-pay | Admitting: Hematology and Oncology

## 2020-10-05 ENCOUNTER — Ambulatory Visit: Payer: Medicare Other | Admitting: Hematology and Oncology

## 2020-10-05 ENCOUNTER — Other Ambulatory Visit: Payer: Self-pay | Admitting: Hematology and Oncology

## 2020-10-05 ENCOUNTER — Telehealth: Payer: Self-pay | Admitting: *Deleted

## 2020-10-05 DIAGNOSIS — C8580 Other specified types of non-Hodgkin lymphoma, unspecified site: Secondary | ICD-10-CM

## 2020-10-05 NOTE — Telephone Encounter (Signed)
TCT patient as she did not show for her appts today. No answer and her vm is full.  TCT pt's daughter, Brendia Sacks. Spoke with her and asked about her mother. She sates she called yesterday ad left a message cancelling her appt for today. Her mom is fine but [name] is sick with either the Flu or maybe Covid.  [name] is her mother's transportation.  Advised that we will re-schedule for 1-2 weeks. [name] voiced understanding.  Dr. Lorenso Courier made aware.

## 2020-10-05 NOTE — Telephone Encounter (Signed)
Rescheduled missed appointment per 1/21 schedule message. Patient's daughter is aware.

## 2020-10-17 ENCOUNTER — Ambulatory Visit: Payer: Medicare Other | Admitting: Internal Medicine

## 2020-10-25 ENCOUNTER — Inpatient Hospital Stay: Payer: Medicare Other

## 2020-10-25 ENCOUNTER — Inpatient Hospital Stay (HOSPITAL_BASED_OUTPATIENT_CLINIC_OR_DEPARTMENT_OTHER): Payer: Medicare Other | Admitting: Hematology and Oncology

## 2020-10-25 ENCOUNTER — Other Ambulatory Visit: Payer: Self-pay

## 2020-10-25 ENCOUNTER — Inpatient Hospital Stay: Payer: Medicare Other | Attending: Hematology and Oncology

## 2020-10-25 ENCOUNTER — Encounter: Payer: Self-pay | Admitting: Hematology and Oncology

## 2020-10-25 VITALS — BP 163/77 | HR 78 | Temp 97.8°F | Resp 20 | Ht 62.0 in | Wt 132.2 lb

## 2020-10-25 DIAGNOSIS — C8580 Other specified types of non-Hodgkin lymphoma, unspecified site: Secondary | ICD-10-CM | POA: Diagnosis not present

## 2020-10-25 DIAGNOSIS — D500 Iron deficiency anemia secondary to blood loss (chronic): Secondary | ICD-10-CM | POA: Diagnosis not present

## 2020-10-25 DIAGNOSIS — Z7189 Other specified counseling: Secondary | ICD-10-CM

## 2020-10-25 DIAGNOSIS — Z0000 Encounter for general adult medical examination without abnormal findings: Secondary | ICD-10-CM | POA: Diagnosis not present

## 2020-10-25 DIAGNOSIS — C8514 Unspecified B-cell lymphoma, lymph nodes of axilla and upper limb: Secondary | ICD-10-CM | POA: Insufficient documentation

## 2020-10-25 DIAGNOSIS — Z95828 Presence of other vascular implants and grafts: Secondary | ICD-10-CM | POA: Diagnosis not present

## 2020-10-25 DIAGNOSIS — E559 Vitamin D deficiency, unspecified: Secondary | ICD-10-CM | POA: Diagnosis not present

## 2020-10-25 DIAGNOSIS — Z7689 Persons encountering health services in other specified circumstances: Secondary | ICD-10-CM | POA: Diagnosis not present

## 2020-10-25 LAB — CBC WITH DIFFERENTIAL (CANCER CENTER ONLY)
Abs Immature Granulocytes: 0.06 10*3/uL (ref 0.00–0.07)
Basophils Absolute: 0.1 10*3/uL (ref 0.0–0.1)
Basophils Relative: 1 %
Eosinophils Absolute: 0.3 10*3/uL (ref 0.0–0.5)
Eosinophils Relative: 6 %
HCT: 35.1 % — ABNORMAL LOW (ref 36.0–46.0)
Hemoglobin: 12.4 g/dL (ref 12.0–15.0)
Immature Granulocytes: 1 %
Lymphocytes Relative: 24 %
Lymphs Abs: 1.3 10*3/uL (ref 0.7–4.0)
MCH: 31.2 pg (ref 26.0–34.0)
MCHC: 35.3 g/dL (ref 30.0–36.0)
MCV: 88.2 fL (ref 80.0–100.0)
Monocytes Absolute: 0.5 10*3/uL (ref 0.1–1.0)
Monocytes Relative: 10 %
Neutro Abs: 3 10*3/uL (ref 1.7–7.7)
Neutrophils Relative %: 58 %
Platelet Count: 167 10*3/uL (ref 150–400)
RBC: 3.98 MIL/uL (ref 3.87–5.11)
RDW: 13.2 % (ref 11.5–15.5)
WBC Count: 5.2 10*3/uL (ref 4.0–10.5)
nRBC: 0 % (ref 0.0–0.2)

## 2020-10-25 LAB — CMP (CANCER CENTER ONLY)
ALT: 14 U/L (ref 0–44)
AST: 16 U/L (ref 15–41)
Albumin: 4 g/dL (ref 3.5–5.0)
Alkaline Phosphatase: 63 U/L (ref 38–126)
Anion gap: 9 (ref 5–15)
BUN: 17 mg/dL (ref 8–23)
CO2: 25 mmol/L (ref 22–32)
Calcium: 9.1 mg/dL (ref 8.9–10.3)
Chloride: 107 mmol/L (ref 98–111)
Creatinine: 1.2 mg/dL — ABNORMAL HIGH (ref 0.44–1.00)
GFR, Estimated: 46 mL/min — ABNORMAL LOW (ref >60)
Glucose, Bld: 109 mg/dL — ABNORMAL HIGH (ref 70–99)
Potassium: 3.7 mmol/L (ref 3.5–5.1)
Sodium: 141 mmol/L (ref 135–145)
Total Bilirubin: 0.4 mg/dL (ref 0.3–1.2)
Total Protein: 6.5 g/dL (ref 6.5–8.1)

## 2020-10-25 LAB — LACTATE DEHYDROGENASE: LDH: 201 U/L — ABNORMAL HIGH (ref 98–192)

## 2020-10-25 MED ORDER — HEPARIN SOD (PORK) LOCK FLUSH 100 UNIT/ML IV SOLN
500.0000 [IU] | Freq: Once | INTRAVENOUS | Status: AC | PRN
  Administered 2020-10-25: 500 [IU]
  Filled 2020-10-25: qty 5

## 2020-10-25 MED ORDER — SODIUM CHLORIDE 0.9% FLUSH
10.0000 mL | INTRAVENOUS | Status: DC | PRN
Start: 2020-10-25 — End: 2020-10-25
  Administered 2020-10-25: 10 mL
  Filled 2020-10-25: qty 10

## 2020-10-25 NOTE — Progress Notes (Signed)
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  Patient Characteristics: Marginal Zone Lymphoma, Systemic, Second Line Disease Type: Marginal Zone Lymphoma Disease Type: Not Applicable Disease Type: Not Applicable Localized or Systemic Disease<= Systemic Line of Therapy: Second Line

## 2020-10-25 NOTE — Patient Instructions (Signed)
Implanted Port Insertion, Care After This sheet gives you information about how to care for yourself after your procedure. Your health care provider may also give you more specific instructions. If you have problems or questions, contact your health care provider. What can I expect after the procedure? After the procedure, it is common to have:  Discomfort at the port insertion site.  Bruising on the skin over the port. This should improve over 3-4 days. Follow these instructions at home: Port care  After your port is placed, you will get a manufacturer's information card. The card has information about your port. Keep this card with you at all times.  Take care of the port as told by your health care provider. Ask your health care provider if you or a family member can get training for taking care of the port at home. A home health care nurse may also take care of the port.  Make sure to remember what type of port you have. Incision care  Follow instructions from your health care provider about how to take care of your port insertion site. Make sure you: ? Wash your hands with soap and water before and after you change your bandage (dressing). If soap and water are not available, use hand sanitizer. ? Change your dressing as told by your health care provider. ? Leave stitches (sutures), skin glue, or adhesive strips in place. These skin closures may need to stay in place for 2 weeks or longer. If adhesive strip edges start to loosen and curl up, you may trim the loose edges. Do not remove adhesive strips completely unless your health care provider tells you to do that.  Check your port insertion site every day for signs of infection. Check for: ? Redness, swelling, or pain. ? Fluid or blood. ? Warmth. ? Pus or a bad smell.      Activity  Return to your normal activities as told by your health care provider. Ask your health care provider what activities are safe for you.  Do not  lift anything that is heavier than 10 lb (4.5 kg), or the limit that you are told, until your health care provider says that it is safe. General instructions  Take over-the-counter and prescription medicines only as told by your health care provider.  Do not take baths, swim, or use a hot tub until your health care provider approves. Ask your health care provider if you may take showers. You may only be allowed to take sponge baths.  Do not drive for 24 hours if you were given a sedative during your procedure.  Wear a medical alert bracelet in case of an emergency. This will tell any health care providers that you have a port.  Keep all follow-up visits as told by your health care provider. This is important. Contact a health care provider if:  You cannot flush your port with saline as directed, or you cannot draw blood from the port.  You have a fever or chills.  You have redness, swelling, or pain around your port insertion site.  You have fluid or blood coming from your port insertion site.  Your port insertion site feels warm to the touch.  You have pus or a bad smell coming from the port insertion site. Get help right away if:  You have chest pain or shortness of breath.  You have bleeding from your port that you cannot control. Summary  Take care of the port as told by your   health care provider. Keep the manufacturer's information card with you at all times.  Change your dressing as told by your health care provider.  Contact a health care provider if you have a fever or chills or if you have redness, swelling, or pain around your port insertion site.  Keep all follow-up visits as told by your health care provider. This information is not intended to replace advice given to you by your health care provider. Make sure you discuss any questions you have with your health care provider. Document Revised: 03/30/2018 Document Reviewed: 03/30/2018 [employer] Patient Education   2021 [employer]  

## 2020-10-25 NOTE — Progress Notes (Signed)
[place] [phone]   [phone]  PROGRESS NOTE  Patient Care Team: Ma Hillock, DO as PCP - General (Family Medicine) Danie Binder, MD (Inactive) as Consulting Physician (Gastroenterology) Gala Romney Cristopher Estimable, MD as Consulting Physician (Gastroenterology) Minus Breeding, MD as Consulting Physician (Cardiology) Annitta Needs, NP (Gastroenterology) Carlis Stable, NP as Nurse Practitioner (Gastroenterology) Montez Morita, Quillian Quince, MD as Consulting Physician (Gastroenterology)  Hematological/Oncological History  # Low Grade Marginal Zone Lymphoma, Stage III 1) 06/06/2019: patient underwent a diagnostic mammogram of the left breast. Calcifications noted in the left breast, recommended 6 month f/u imaging. 2) 02/02/2020: bilateral diagnostic mammogram showedabnormal enlarged lymph nodes with cortical thickening. The largest of these measures 2.2 x 1.7 x 2.0 cm. 3) 02/02/2020: US guided biopsy of left axillary lymph nodes performed. 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 4) 02/16/2020: establish care with Dr. Lorenso Courier 5) 02/29/2020: PET CT scan performed, showed hypermetabolic lymph nodes identified in the posterior left neck, both axillary/subpectoral regions, mediastinum, hila, right external iliac chain, and right groin. Massive splenomegaly of 17.8 cm noted as well.  6) 03/20/2020: excisional biopsy of left posterior cervical lymph node. Biopsy confirmed most likely low grade marginal zone lymphoma.   7) 7/14-7/15/2021: Cycle 1 Day 1 of Rituximab/Bendamycin 8) 03/30/2020-04/01/2020: admitted inpatient for hematemesis. Noted to have marked esophagitis on EGD.  9) 04/25/2020: IV feraheme 510mg  administered 10) 04/26/2020: Cycle 2 Day 1 of  Rituximab/Bendamycin 11) 05/10/2020: HOLD R-Benda in setting of colitis, GI bleed, and intolerance to therapy. Plan to start monotherapy Rituximab once patient has rebounded.  12) 07/06/2020: Cycle 1 Week 1 of monotherapy rituximab 13) 07/30/2020: Cycle 1 Week 3 of monotherapy rituximab 14) 08/06/2020: Completed Cycle 1 of monotherapy rituximab 15)  09/12/2020: PET CT scan shows completed resolution of tumor and FDG avid lesions. Deauville Score 1. Complete response.   Interval History:  WHITNEE ORZEL 80 y.o. 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 The patient's last visit was on 07/30/2020 for continued f/u. In interim she has had a PET CT scan which shows complete response to therapy.   On exam today Mrs. Lem reports that she has been well in the interim since her last visit.  Her weight has been increasing steadily and her appetite has been good.  She reports that her energy is "fair".  And that is better than prior.  She reports is about 85% of her baseline.  She is having some memory issues for which her daughter is concerned.  They will be checking with her primary care doctor for assessment of possible cognitive decline.  She denies having any fevers, chills, sweats, nausea, vomiting or diarrhea.   A full 10 point ROS is listed below.  The bulk of our visit today focused on the results of the PET CT scan and the options moving forward for treatment.  MEDICAL HISTORY:  Past Medical History:  Diagnosis Date  . Asthma   . Bloating 04/26/2019  . Cancer ([place]) 2021   Lymphoma  . 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 DIABETES MELLITUS, TYPE II 11/09/2007   diet control  . Diverticulosis 03/2011  . GERD (gastroesophageal reflux disease)    none recently  .  GI bleed   . History of rheumatoid arthritis    during 30's, was treated.  . Hyperlipidemia   .  Hypertension   . IBS (irritable bowel syndrome)   . Osteopenia 2017   Last  bone density 05/04/2017: -2.4  . PONV (postoperative nausea and vomiting)   . Stress incontinence   . Stroke [facility])    mini stroke - found on a CT scan    SURGICAL HISTORY: Past Surgical History:  Procedure Laterality Date  . ABDOMINAL HYSTERECTOMY    . BALLOON DILATION N/A 08/21/2020   Procedure: BALLOON DILATION;  Surgeon: Eloise Harman, DO;  [facility];  Service: Endoscopy;  Laterality: N/A;  . BIOPSY  03/30/2020   Procedure: BIOPSY;  Surgeon: Ronald Lobo, MD;  Location: [facility];  Service: Endoscopy;;  . BIOPSY  05/02/2020   Procedure: BIOPSY;  Surgeon: Harvel Quale, MD;  Location[facility];  Service: Gastroenterology;;  . CARDIAC CATHETERIZATION     X 2, last one in 1998  . CHOLECYSTECTOMY N/A 04/13/2017   Procedure: LAPAROSCOPIC CHOLECYSTECTOMY;  Surgeon: Aviva Signs, MD;  Location: [facility];  Service: General;  Laterality: N/A;  . COLONOSCOPY    . COLONOSCOPY  May 2012   Dr. Olevia Perches: mild diverticulosis, otherwise normal.   . COLONOSCOPY WITH PROPOFOL N/A 05/02/2020   Dr. Jenetta Downer: 8 mm polyp removed from the ascending colon, 2 mm polyp removed from the ascending colon.  Tubular adenomas.  Diverticulosis.  Mucosal ulceration in the sigmoid colon noted, pathology consistent with ischemic colitis.  Marland Kitchen ESOPHAGOGASTRODUODENOSCOPY N/A 01/28/2015   Dr. Gala Romney: reflux esophagitis, Schatzki's ring not manipulated due to recent bleeding  . ESOPHAGOGASTRODUODENOSCOPY N/A 03/30/2015   Dr. Gala Romney: Schatzki's ring s/p Venia Minks dilation, previously noted esophageal ulcer completely healed  . ESOPHAGOGASTRODUODENOSCOPY N/A 03/30/2020   Buccini: Moderately severe erosive, circumferential, confluent esophagitis with no bleeding found 25 to 40 cm from incisors.  Nonobstructing and mild Schatzki ring, there were also multiple distal esophageal rings noted, minimal hiatal hernia.  Marland Kitchen  ESOPHAGOGASTRODUODENOSCOPY (EGD) WITH PROPOFOL N/A 08/21/2020   Procedure: ESOPHAGOGASTRODUODENOSCOPY (EGD) WITH PROPOFOL;  Surgeon: Eloise Harman, DO;  [facility];  Service: Endoscopy;  Laterality: N/A;  2:15pm  . IR IMAGING GUIDED PORT INSERTION  03/13/2020  . LYMPH NODE BIOPSY Left 03/20/2020   Procedure: LEFT POSTERIOR CERVICAL LYMPH NODE BIOPSY;  Surgeon: Georganna Skeans, MD;  Location: [place];  Service: General;  Laterality: Left;  Marland Kitchen MALONEY DILATION N/A 03/30/2015   Procedure: Venia Minks DILATION;  Surgeon: Daneil Dolin, MD;  [facility];  Service: Endoscopy;  Laterality: N/A;  . POLYPECTOMY  05/02/2020   Procedure: POLYPECTOMY;  Surgeon: Harvel Quale, MD;  [facility];  Service: Gastroenterology;;    SOCIAL HISTORY: Social History   Socioeconomic History  . Marital status: Widowed    Spouse name: Not on file  . Number of children: 2  . Years of education: Not on file  . Highest education level: Not on file  Occupational History  . Occupation: retired  Tobacco Use  . Smoking status: Never Smoker  . Smokeless tobacco: Never Used  Vaping Use  . Vaping Use: Never used  Substance and Sexual Activity  . Alcohol use: No  . Drug use: No  . Sexual activity: Never  Other Topics Concern  . Not on file  Social History Narrative   Ms. Bake is widowed. Her young grandson lives with her, for whom she shares custody with  her daughter, the son's aunt.     Social Determinants of Health   Financial Resource Strain: Not on file  Food Insecurity: Not on file  Transportation Needs: Not on file  Physical Activity: Not on file  Stress: Not on file  Social Connections: Not on file  Intimate Partner Violence: Not on file    FAMILY HISTORY: Family History  Problem Relation Age of Onset  . Heart disease Mother   . Osteoarthritis Mother   . Sudden death Father   . Single kidney Father   . Other Father        h/o severe MVA injuries   . Hyperlipidemia Sister   . Other Daughter        Myalgias  . Fibromyalgia Daughter   . Allergies Daughter   . Heart disease Maternal Grandfather   . Sudden death Paternal Grandmother   . Diabetes Paternal Grandfather   . Heart disease Daughter   . Other Daughter        palpitations  . Pulmonary fibrosis Maternal Aunt   . Cancer Paternal Uncle   . Pulmonary fibrosis Maternal Aunt   . Colon cancer Neg Hx     ALLERGIES:  is allergic to codeine phosphate.  MEDICATIONS:  Current Outpatient Medications  Medication Sig Dispense Refill  . acetaminophen (TYLENOL) 325 MG tablet Take 325 mg by mouth every 6 (six) hours as needed for moderate pain.     Marland Kitchen albuterol (VENTOLIN HFA) 108 (90 Base) MCG/ACT inhaler Inhale 2 puffs into the lungs every 6 (six) hours as needed for wheezing. 8.5 g 2  . allopurinol (ZYLOPRIM) 300 MG tablet Take 1 tablet (300 mg total) by mouth daily. 30 tablet 3  . amLODipine (NORVASC) 10 MG tablet Take 1 tablet (10 mg total) by mouth daily. 90 tablet 1  . cetirizine (ZYRTEC) 10 MG tablet Take 10 mg by mouth at bedtime.    . Cholecalciferol (VITAMIN D3) 25 MCG (1000 UT) CAPS Take 1,000 Units by mouth daily.     Marland Kitchen escitalopram (LEXAPRO) 10 MG tablet Take 1 tablet (10 mg total) by mouth daily. (Patient not taking: Reported on 08/30/2020) 90 tablet 0  . levothyroxine (SYNTHROID) 75 MCG tablet Take 1 tablet (75 mcg total) by mouth daily. 90 tablet 3  . lidocaine-prilocaine (EMLA) cream Apply 1 application topically as needed. (Patient taking differently: Apply 1 application topically as needed (port access). ) 30 g 1  . magic mouthwash w/lidocaine SOLN Take 5 mLs by mouth 4 (four) times daily as needed for mouth pain. (Patient not taking: Reported on 08/30/2020) 260 mL 1  . pantoprazole (PROTONIX) 40 MG tablet Take 1 tablet (40 mg total) by mouth daily. 30 tablet 3  . potassium chloride SA (KLOR-CON) 20 MEQ tablet Take 1 tablet (20 mEq total) by mouth daily. 21 tablet 0  .  rOPINIRole (REQUIP) 0.25 MG tablet Take 1 tablet (0.25 mg total) by mouth at bedtime. (Patient taking differently: Take 0.25 mg by mouth at bedtime as needed (restless legs). ) 90 tablet 1  . sucralfate (CARAFATE) 1 g tablet Take 1 g by mouth 4 (four) times daily -  with meals and at bedtime. (Patient not taking: Reported on 10/25/2020)    . vitamin B-12 (CYANOCOBALAMIN) 1000 MCG tablet Take 1,000 mcg by mouth daily.      No current facility-administered medications for this visit.    REVIEW OF SYSTEMS:   Constitutional: ( - ) fevers, ( - )  chills , ( - )  night sweats Eyes: ( - ) blurriness of vision, ( - ) double vision, ( - ) watery eyes Ears, nose, mouth, throat, and face: ( - ) mucositis, ( - ) sore throat Respiratory: ( - ) cough, ( - ) dyspnea, ( - ) wheezes Cardiovascular: ( - ) palpitation, ( - ) chest discomfort, ( - ) lower extremity swelling Gastrointestinal:  ( - ) nausea, ( - ) heartburn, ( - ) change in bowel habits Skin: ( - ) abnormal skin rashes Lymphatics: ( - ) new lymphadenopathy, ( - ) easy bruising Neurological: ( - ) numbness, ( - ) tingling, ( - ) new weaknesses Behavioral/Psych: ( - ) mood change, ( - ) new changes  All other systems were reviewed with the patient and are negative.  PHYSICAL EXAMINATION: ECOG PERFORMANCE STATUS: 1 - Symptomatic but completely ambulatory  Vitals:   10/25/20 1003  BP: (!) 163/77  Pulse: 78  Resp: 20  Temp: 97.8 F (36.6 C)  SpO2: 100%   Filed Weights   10/25/20 1003  Weight: 132 lb 3.2 oz (60 kg)    GENERAL: well appearing elderly Caucasian female in NAD  SKIN: skin color, texture, turgor are normal. Small dime sized dry skin lesions on chest, about 5 in number. Improved from prior EYES: conjunctiva are pink and non-injected, sclera clear NECK: supple, non-tender.  LUNGS: clear to auscultation and percussion with normal breathing effort HEART: regular rate & rhythm and no murmurs and no lower extremity  edema Musculoskeletal: no cyanosis of digits and no clubbing  PSYCH: alert & oriented x 3, fluent speech NEURO: no focal motor/sensory deficits  LABORATORY DATA:  I have reviewed the data as listed CBC Latest Ref Rng & Units 10/25/2020 08/30/2020 07/27/2020  WBC 4.0 - 10.5 K/uL 5.2 4.5 8.4  Hemoglobin 12.0 - 15.0 g/dL 12.4 12.4 12.7  Hematocrit 36.0 - 46.0 % 35.1(L) 35.5(L) 35.1(L)  Platelets 150 - 400 K/uL 167 179 145(L)    CMP Latest Ref Rng & Units 08/30/2020 07/27/2020 07/20/2020  Glucose 70 - 99 mg/dL 138(H) 107(H) 111(H)  BUN 8 - 23 mg/dL 13 19 25(H)  Creatinine 0.44 - 1.00 mg/dL 1.33(H) 1.17(H) 1.09(H)  Sodium 135 - 145 mmol/L 139 139 138  Potassium 3.5 - 5.1 mmol/L 2.9(L) 3.3(L) 3.3(L)  Chloride 98 - 111 mmol/L 105 107 104  CO2 22 - 32 mmol/L 25 24 25   Calcium 8.9 - 10.3 mg/dL 9.3 9.7 9.6  Total Protein 6.5 - 8.1 g/dL 6.7 6.7 6.6  Total Bilirubin 0.3 - 1.2 mg/dL 0.4 0.6 0.5  Alkaline Phos 38 - 126 U/L 61 58 62  AST 15 - 41 U/L 19 13(L) 14(L)  ALT 0 - 44 U/L 11 8 11     RADIOGRAPHIC STUDIES: I have personally reviewed the radiological images as listed and agreed with the findings in the report: FDG avid spleen and axillary lymph nodes. Involvement of cervical and inguinal nodes as well.  No results found.  ASSESSMENT & PLAN STELA IWASAKI 80 y.o. 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  On exam today Mrs. Pamintuan is having some issues with memory, but has otherwise been at her baseline health.  Today we focused on the discussion of consolidation therapy and the risks and benefits thereof.  In regards to consolidation therapy there are different sources literature from which we can draw.  Several sources letter to note that with single agent  rituximab you could follow the protocols used for follicular lymphoma and continue with maintenance therapy every 2 months x4 total doses.  Typically  after intensive chemotherapy the maintenance therapy consists of rituximab every 2 months for 2 total years.  Given the fact the patient was not able to tolerate more intensive chemotherapy I recommend we abide by the recommendations for monotherapy rituximab and continue for an additional 4 doses.  Given her excellent response to therapy and the overall excellent prognosis offered by marginal zone lymphoma I think this is a reasonable approach.  There is also regional approach to continue observation alone, however after given the option the patient decided to proceed with consolidation therapy.  Previously I recommend a rituximab monotherapy given her intolerance of BR x 2 cycles. This regimen consisted of rituximab 375 mg/m2 IV q weekly x 4 weeks. During her scans on 8/16 - 05/03/2020 she was shown to have marked response to therapy with normalization of spleen size and resolution of lymphadenopathy. Repeat PET CT scan after completed of rituximab monotherapy showed a completed response. We will proceed with q 2 months maintenance/consolidation rituximab.   GELF Criteria: 1 (splenomegaly). Indication for treatment  # Low Grade Marginal Zone Lymphoma, Stage III -- discontinued bendamustine + rituximab as patient is unable to tolerate this treatment. Will pursue rituximab monotherapy instead. Started therapy with Cycle 1 Day 1 on 03/28/2020 BR, decreased to Ritux alone q weekly x 4 starting 07/06/2020. --patient completed excisional lymph node biopsy to confirm the diagnosis. Pathological exam of the lymph node confirms marginal zone lymphoma.  --PET CT scan confirms stage III disease. Patient meets GELF criteria for treatment (splenomegaly). She did not have any B symptoms and her counts were stable at diagnosis, with some mild thrombocytopenia.  --Patient completed monotherapy rituximab x 4 weeks on 08/06/2020.  --post treatment PET CT scan shows Deauville Score 1, complete response to therapy.  --will  plan to start q 8 week rituximab 375mg /m2 maintenance. Therapy. This can be continued up to 4 doses --RTC for next dose of rituximab in approximately 2 weeks.    #Concern for GI Bleed, resolved #Hematemesis, resolved #Colitis, resolved --patient admitted on 03/30/2020 (day after Cycle 1 chemotherapy) with hematemesis --no overt GI bleeding noted on Upper GI evaluation -- no signs of bleeding since last visit.  --iron levels appeared low previously and patient was having symptoms of iron deficiency anemia including fatigue, restless leg, and dyspnea on exertion. -- Repeat labs today, Hgb continues rebounding nicely.   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   No orders of the defined types were placed in this encounter.   All questions were answered. The patient knows to call the clinic with any problems, questions or concerns.  A total of more than 30 minutes were spent on this encounter and over half of that time was spent on counseling and coordination of care as outlined above.   Ledell Peoples, MD Department of Hematology/Oncology [place] [facility] Phone: [phone] Pager: [phone] Email: [email or web address]  10/25/2020 10:26 AM   Literature Support:  NCCN Guidelines: Consolidation with rituximab should be offered if initially treated with single agent rituximab. (MS-63)  UptoDate: Different schedules have been used. The following administration schedules were used in  the randomized trials and are equally acceptable approaches:  ?Rituximab 375 mg/m2 intravenously per week for a total of four doses  ?Rituximab 375 mg/m2 intravenously per week for four weeks, followed by four additional doses administered every  two months

## 2020-10-31 ENCOUNTER — Telehealth: Payer: Self-pay | Admitting: Hematology and Oncology

## 2020-10-31 NOTE — Telephone Encounter (Signed)
Called patient regarding 02/24 and 02/25 appointments, patient cannot get treatment done in [facility] and the only option will have to be [place]. Will attempt to contact patient again. Voicemail is full.

## 2020-11-01 ENCOUNTER — Telehealth: Payer: Self-pay | Admitting: *Deleted

## 2020-11-01 ENCOUNTER — Telehealth: Payer: Self-pay | Admitting: Hematology and Oncology

## 2020-11-01 NOTE — Telephone Encounter (Signed)
Received call from pt's daughter, Brendia Sacks. She saw that pt's next infusion is scheduled @ [place] office. Pt lives in [place] and [name] lives an hour away and is her transportation. She is requesting to re-schedule visits on 11/08/20 and 11/09/20 to another day and all on same day in the [place] center.  Scheduling message sent

## 2020-11-01 NOTE — Telephone Encounter (Signed)
Called patient regarding upcoming appointments, patient's voicemail is full and I am still not able to get in touch with her. Will attempt to call again.

## 2020-11-01 NOTE — Addendum Note (Signed)
Addended by: Tora Kindred on: 11/01/2020 11:08 AM   Modules accepted: Orders

## 2020-11-02 NOTE — Progress Notes (Deleted)
Pharmacist Chemotherapy Monitoring - Initial Assessment    Anticipated start date: 11/09/20  Regimen:  . Are orders appropriate based on the patient's diagnosis, regimen, and cycle? Yes . Does the plan date match the patient's scheduled date? Yes . Is the sequencing of drugs appropriate? Yes . Are the premedications appropriate for the patient's regimen? Yes . Prior Authorization for treatment is: Approved o If applicable, is the correct biosimilar selected based on the patient's insurance? {YES/NO/NOT APPLICABLE:20182}  Organ Function and Labs: Marland Kitchen Are dose adjustments needed based on the patient's renal function, hepatic function, or hematologic function? {yes/no:20286} . Are appropriate labs ordered prior to the start of patient's treatment? {yes/no:20286} . Other organ system assessment, if indicated: {Rx chemo organ:23687} . The following baseline labs, if indicated, have been ordered: {Rx chemo other labs:23685}  Dose Assessment: . Are the drug doses appropriate? {yes/no:20286} . Are the following correct: o Drug concentrations {yes/no:20286} o IV fluid compatible with drug {yes/no:20286} o Administration routes {yes/no:20286} o Timing of therapy {yes/no:20286} . If applicable, does the patient have documented access for treatment and/or plans for port-a-cath placement? {YES/NO/NOT APPLICABLE:20182} . If applicable, have lifetime cumulative doses been properly documented and assessed? {YES/NO/NOT APPLICABLE:20182} Lifetime Dose Tracking  No doses have been documented on this patient for the following tracked chemicals: Doxorubicin, Epirubicin, Idarubicin, Daunorubicin, Mitoxantrone, Bleomycin, Oxaliplatin, Carboplatin, Liposomal Doxorubicin  o   Toxicity Monitoring/Prevention: . The patient has the following take home antiemetics prescribed: {Rx chemo antiemetics:23689} . The patient has the following take home medications prescribed: {Rx chemo toxicity:23686} . Medication  allergies and previous infusion related reactions, if applicable, have been reviewed and addressed. {yes/no:20286} . The patient's current medication list has been assessed for drug-drug interactions with their chemotherapy regimen. {No/  **:31982:o} significant drug-drug interactions were identified on review.  Order Review: . Are the treatment plan orders signed? {yes/no:20286} . Is the patient scheduled to see a provider prior to their treatment? {yes/no:20286}  I verify that I have reviewed each item in the above checklist and answered each question accordingly.  Claybon Jabs, [facility], 11/02/2020  7:48 AM

## 2020-11-05 ENCOUNTER — Telehealth: Payer: Self-pay | Admitting: Hematology and Oncology

## 2020-11-05 ENCOUNTER — Telehealth: Payer: Self-pay

## 2020-11-05 NOTE — Telephone Encounter (Signed)
Rescheduled appts per 2/17 sch msg - pt daughter is aware of apt date and time

## 2020-11-05 NOTE — Telephone Encounter (Signed)
Cancel 11/09/20 tx per staff message/gsbo, done    [employer]

## 2020-11-08 ENCOUNTER — Other Ambulatory Visit: Payer: Medicare Other

## 2020-11-08 ENCOUNTER — Ambulatory Visit: Payer: Medicare Other | Admitting: Hematology and Oncology

## 2020-11-09 ENCOUNTER — Ambulatory Visit: Payer: Medicare Other

## 2020-11-12 ENCOUNTER — Other Ambulatory Visit: Payer: Self-pay

## 2020-11-13 ENCOUNTER — Ambulatory Visit (INDEPENDENT_AMBULATORY_CARE_PROVIDER_SITE_OTHER): Payer: Medicare Other | Admitting: Family Medicine

## 2020-11-13 ENCOUNTER — Encounter: Payer: Self-pay | Admitting: Family Medicine

## 2020-11-13 VITALS — BP 170/79 | HR 66 | Temp 97.4°F | Ht 62.0 in

## 2020-11-13 DIAGNOSIS — E782 Mixed hyperlipidemia: Secondary | ICD-10-CM

## 2020-11-13 DIAGNOSIS — G319 Degenerative disease of nervous system, unspecified: Secondary | ICD-10-CM | POA: Diagnosis not present

## 2020-11-13 DIAGNOSIS — C8580 Other specified types of non-Hodgkin lymphoma, unspecified site: Secondary | ICD-10-CM

## 2020-11-13 DIAGNOSIS — R4189 Other symptoms and signs involving cognitive functions and awareness: Secondary | ICD-10-CM

## 2020-11-13 DIAGNOSIS — R7303 Prediabetes: Secondary | ICD-10-CM | POA: Diagnosis not present

## 2020-11-13 DIAGNOSIS — E1122 Type 2 diabetes mellitus with diabetic chronic kidney disease: Secondary | ICD-10-CM

## 2020-11-13 DIAGNOSIS — E538 Deficiency of other specified B group vitamins: Secondary | ICD-10-CM | POA: Diagnosis not present

## 2020-11-13 DIAGNOSIS — I10 Essential (primary) hypertension: Secondary | ICD-10-CM

## 2020-11-13 DIAGNOSIS — F339 Major depressive disorder, recurrent, unspecified: Secondary | ICD-10-CM

## 2020-11-13 DIAGNOSIS — J4520 Mild intermittent asthma, uncomplicated: Secondary | ICD-10-CM

## 2020-11-13 DIAGNOSIS — N183 Chronic kidney disease, stage 3 unspecified: Secondary | ICD-10-CM | POA: Diagnosis not present

## 2020-11-13 DIAGNOSIS — E039 Hypothyroidism, unspecified: Secondary | ICD-10-CM

## 2020-11-13 DIAGNOSIS — E559 Vitamin D deficiency, unspecified: Secondary | ICD-10-CM | POA: Diagnosis not present

## 2020-11-13 DIAGNOSIS — N2581 Secondary hyperparathyroidism of renal origin: Secondary | ICD-10-CM

## 2020-11-13 DIAGNOSIS — I679 Cerebrovascular disease, unspecified: Secondary | ICD-10-CM | POA: Diagnosis not present

## 2020-11-13 LAB — HEMOGLOBIN A1C: Hgb A1c MFr Bld: 4.9 % (ref 4.6–6.5)

## 2020-11-13 LAB — VITAMIN D 25 HYDROXY (VIT D DEFICIENCY, FRACTURES): VITD: 37.62 ng/mL (ref 30.00–100.00)

## 2020-11-13 LAB — TSH: TSH: 5.7 u[IU]/mL — ABNORMAL HIGH (ref 0.35–4.50)

## 2020-11-13 LAB — VITAMIN B12: Vitamin B-12: 462 pg/mL (ref 211–911)

## 2020-11-13 LAB — LDL CHOLESTEROL, DIRECT: Direct LDL: 155 mg/dL

## 2020-11-13 MED ORDER — ROPINIROLE HCL 0.25 MG PO TABS: 0.2500 mg | ORAL_TABLET | Freq: Every day | ORAL | 1 refills | Status: DC

## 2020-11-13 MED ORDER — LEVOTHYROXINE SODIUM 75 MCG PO TABS
75.0000 ug | ORAL_TABLET | Freq: Every day | ORAL | 3 refills | Status: DC
Start: 2020-11-13 — End: 2020-11-14

## 2020-11-13 MED ORDER — AMLODIPINE BESYLATE 10 MG PO TABS: 10.0000 mg | ORAL_TABLET | Freq: Every day | ORAL | 1 refills | Status: DC

## 2020-11-13 NOTE — Patient Instructions (Signed)
Great to see you today.  Make sure  to take your meds daily. A pill box can be very helpful.   We will call you with lab results.   I will also refer you to neurology to discuss your memory changes.    Cerebral Atrophy  Cerebral atrophy is a condition that causes brain tissue to shrink due to the loss of brain cells (neurons) and the loss of connections between neurons. Cerebral atrophy may affect the entire brain or only one area. Cerebral atrophy can be a normal part of aging and may not cause any symptoms. In other cases, cerebral atrophy can affect speech and voluntary movements. It can also affect memory and the ability to do daily tasks. What are the causes? This condition may be caused by:  Normal aging.  Dementia, including Alzheimer's disease, vascular dementia, and frontotemporal dementia.  Stroke.  Traumatic brain injury (TBI).  Multiple sclerosis.  Infections, such as HIV/AIDS, encephalitis, and syphilis.  Cerebral palsy.  Huntington's disease. In some cases, the cause may not be known. What increases the risk? You are more likely to develop this condition if you:  Are over age 42.  Have or have had a brain injury.  Have a family history of brain disease, such as Alzheimer's disease.  Have had a stroke.  Have diabetes, high blood pressure, or high cholesterol.  Have other conditions that increase the risk for blood vessel diseases. What are the signs or symptoms? Symptoms of this condition can vary from person to person. Symptoms depend on which part of the brain is affected and the underlying condition leading to the atrophy. Common symptoms of this condition include:  Progressive confusion and loss of memory that interfere with daily functioning (dementia).  Personality changes.  Loss of ability to speak, understand speech, or both (aphasia).  Slowed thinking.  Seizures. Many times, there are no symptoms. How is this diagnosed? This condition  may be diagnosed based on your medical history and a physical exam. You may be referred to a specialist, called a neurologist, who works with this condition. You may need to have tests, including:  Blood tests.  Tests to evaluate your mental functions. You may also have imaging tests, such as:  MRI.  CT scan.  PET scan.  SPECT scan. How is this treated? There is no cure for this condition, but treatment can help to control symptoms and preserve brain function. A team of health care providers will work closely with you and your caregivers to find the best treatment for you. Treatment for this condition can include:  Medicines to treat symptoms and any known causes of this condition.  Emotional support and counseling.  Regular visits with therapists. This may include physical therapy, speech therapy, and occupational therapy.  Social worker support.  Communication and memory devices.  A cane, walker, or wheelchair to help you move around safely. Follow these instructions at home: Eating and drinking  Do not drink alcohol if your health care provider tells you not to drink.  Eat a healthy diet that is high in omega-3 fatty acids and antioxidants. If you have questions about what to eat, ask your health care provider. Lifestyle  Do not use any products that contain nicotine or tobacco, such as cigarettes, e-cigarettes, and chewing tobacco. If you need help quitting, ask your health care provider.  Follow a schedule for sleeping, eating, and exercising as told by your health care provider.  Keep your mind active with stimulating activities you  enjoy, such as reading or playing games.  Practice stress-management techniques when you get stressed. If you need help managing stress, ask your health care provider. General instructions  Take over-the-counter and prescription medicines only as told by your health care provider.  Work closely with your team of health care providers  to create a plan of care that works best for you and your caregivers and keeps you safe.  Keep all follow-up visits. This is important. Where to find more information  [employer] of Neurological Disorders and Stroke: [employer]Box[employer].it Contact a health care provider if you:  Are not able to care for yourself at home.  Feel anxious or depressed.  Have worsening memory problems. Get help right away if you:  Develop new or worsening confusion.  Do not feel safe at home.  Have thoughts about hurting yourself or others. If you ever feel like you may hurt yourself or others, or have thoughts about taking your own life, get help right away. Go to your nearest emergency department or:  Call your local emergency services (911 in the [place]  Call a suicide crisis helpline, such as the [place] at 337-320-4315. This is open 24 hours a day in the [place]  Text the Crisis Text Line at (865)364-3088 (in the [place] Summary  Cerebral atrophy is a condition that causes brain tissue to shrink due to a loss of brain cells (neurons) and the loss of connections between neurons.  Cerebral atrophy may affect the entire brain or only one area.  Cerebral atrophy can affect speech and voluntary movements. It can also affect memory and the ability to do daily functions. In some cases, it may not cause any symptoms.  There is no cure for this condition, but treatment can help to control symptoms and preserve brain function. A team of health care providers will work closely with you and your caregivers to find the best treatment for you. This information is not intended to replace advice given to you by your health care provider. Make sure you discuss any questions you have with your health care provider. Document Revised: 01/16/2020 Document Reviewed: 01/16/2020 Elsevier Patient Education  [place].

## 2020-11-13 NOTE — Progress Notes (Signed)
This visit occurred during the SARS-CoV-2 public health emergency.  Safety protocols were in place, including screening questions prior to the visit, additional usage of staff PPE, and extensive cleaning of exam room while observing appropriate contact time as indicated for disinfecting solutions.    Patient ID: Elizabeth Mcintyre, female  DOB: 14-Jun-1941, 80 y.o.   MRN: [id number] Patient Care Team    Relationship Specialty Notifications Start End  Ma Hillock, DO PCP - General Family Medicine  06/08/19   Danie Binder, MD (Inactive) Consulting Physician Gastroenterology  01/29/15   Daneil Dolin, MD Consulting Physician Gastroenterology  05/14/15   Minus Breeding, MD Consulting Physician Cardiology  01/30/17   Annitta Needs, NP  Gastroenterology  03/13/17   Carlis Stable, NP Nurse Practitioner Gastroenterology  09/29/18   Harvel Quale, MD Consulting Physician Gastroenterology  08/14/20     Chief Complaint  Patient presents with  . Hypertension    Subjective: Elizabeth Mcintyre is a 80 y.o.  Female  present for [facility].  Patient presents today with her daughter. Mild intermittent asthma without complication Patient reports rarely needs to use her albuterol inhaler.    Essential hypertension, benign/hyperlipidemia/overweight/CKD 3 Pt reports  she has not been compliant with amlodipine 10 mg daily.  He forgets to take it.  Patient denies chest pain, shortness of breath, dizziness or lower extremity edema.  She does complain of headaches. Pt was prescribed statin but discontinued use. Diet: Low-sodium Exercise: Exercise routinely RF: Hypertension, hyperlipidemia, diabetes, overweight,, family history of heart disease  Acquired hypothyroidism Patient reports compliance with levothyroxine 75 mcg daily on an empty stomach.   Major depression, recurrent, chronic (HCC) Patient has a history of depression.  She was prescribed Lexapro 10 mg daily but discontinued medication since  her last appointment.   She is considering restarting the medication but she is uncertain.  Vitamin D deficiency/Osteopenia, unspecified location Patient has a history of vitamin D deficiency and osteopenia.  She reports she has been compliant with 2000 units of vitamin D daily.  Prediabetic: Patient has been diet controlled for quite some time.  Last A1c was 6.0    Gastroesophageal reflux disease with esophagitis/chronic bloating/chronic diarrhea Managed by gastroenterology.  He has upcoming appointment with GI.  Depression screen [facility] 2/9 08/14/2020 04/06/2020 12/29/2019 09/29/2018 01/25/2018  Decreased Interest 3 0 0 1 1  Down, Depressed, Hopeless 2 0 0 1 1  PHQ - 2 Score 5 0 0 2 2  Altered sleeping 3 3 3 3 1   Tired, decreased energy 0 3 0 2 1  Change in appetite 3 0 3 2 1   Feeling bad or failure about yourself  0 0 0 1 1  Trouble concentrating 3 0 3 0 0  Moving slowly or fidgety/restless 0 0 0 0 0  Suicidal thoughts 0 0 0 3 0  PHQ-9 Score 14 6 9 13 6   Difficult doing work/chores - Not difficult at all Not difficult at all Somewhat difficult Somewhat difficult  Some recent data might be hidden   No flowsheet data found.   Immunization History  Administered Date(s) Administered  . Influenza Split 07/16/2011  . Influenza, High Dose Seasonal PF 10/15/2017, 09/03/2018  . Influenza,inj,Quad PF,6+ Mos 06/09/2014, 10/15/2015  . Moderna Sars-Covid-2 Vaccination 11/10/2019, 12/09/2019  . Pneumococcal Conjugate-13 03/13/2017  . Pneumococcal Polysaccharide-23 10/27/2014  . Tdap 07/16/2011   Past Medical History:  Diagnosis Date  . Asthma   . Bloating 04/26/2019  . Burning tongue  01/23/2020  . Cancer ([place]) 2021   Lymphoma  . 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 DIABETES MELLITUS, TYPE II 11/09/2007   diet control  . Diverticulosis 03/2011  . Fecal incontinence 07/07/2019  . GERD (gastroesophageal reflux disease)    none recently  .  GI bleed   . Hematemesis 03/30/2020  . History of rheumatoid arthritis    during 30's, was treated.  . Hyperlipidemia   . Hypertension   . IBS (irritable bowel syndrome)   . 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 Lower GI bleeding 04/30/2020  . Lymphadenopathy 01/23/2020  . Osteopenia 2017   Last  bone density 05/04/2017: -2.4  . PONV (postoperative nausea and vomiting)   . Schatzki's ring   . Stress incontinence   . Stroke [facility])    mini stroke - found on a CT scan   Allergies  Allergen Reactions  . Codeine Phosphate Nausea And Vomiting and Rash   Past Surgical History:  Procedure Laterality Date  . ABDOMINAL HYSTERECTOMY    . BALLOON DILATION N/A 08/21/2020   Procedure: BALLOON DILATION;  Surgeon: Eloise Harman, DO;  [facility];  Service: Endoscopy;  Laterality: N/A;  . BIOPSY  03/30/2020   Procedure: BIOPSY;  Surgeon: Ronald Lobo, MD;  [facility];  Service: Endoscopy;;  . BIOPSY  05/02/2020   Procedure: BIOPSY;  Surgeon: Harvel Quale, MD;  [facility];  Service: Gastroenterology;;  . CARDIAC CATHETERIZATION     X 2, last one in 1998  . CHOLECYSTECTOMY N/A 04/13/2017   Procedure: LAPAROSCOPIC CHOLECYSTECTOMY;  Surgeon: Aviva Signs, MD;  Location: [facility];  Service: General;  Laterality: N/A;  . COLONOSCOPY    . COLONOSCOPY  May 2012   Dr. Olevia Perches: mild diverticulosis, otherwise normal.   . COLONOSCOPY WITH PROPOFOL N/A 05/02/2020   Dr. Jenetta Downer: 8 mm polyp removed from the ascending colon, 2 mm polyp removed from the ascending colon.  Tubular adenomas.  Diverticulosis.  Mucosal ulceration in the sigmoid colon noted, pathology consistent with ischemic colitis.  Marland Kitchen ESOPHAGOGASTRODUODENOSCOPY N/A 01/28/2015   Dr. Gala Romney: reflux esophagitis, Schatzki's ring not manipulated due to recent bleeding  . ESOPHAGOGASTRODUODENOSCOPY N/A 03/30/2015   Dr. Gala Romney: Schatzki's ring s/p Venia Minks dilation, previously noted  esophageal ulcer completely healed  . ESOPHAGOGASTRODUODENOSCOPY N/A 03/30/2020   Buccini: Moderately severe erosive, circumferential, confluent esophagitis with no bleeding found 25 to 40 cm from incisors.  Nonobstructing and mild Schatzki ring, there were also multiple distal esophageal rings noted, minimal hiatal hernia.  Marland Kitchen ESOPHAGOGASTRODUODENOSCOPY (EGD) WITH PROPOFOL N/A 08/21/2020   Procedure: ESOPHAGOGASTRODUODENOSCOPY (EGD) WITH PROPOFOL;  Surgeon: Eloise Harman, DO;  [facility];  Service: Endoscopy;  Laterality: N/A;  2:15pm  . IR IMAGING GUIDED PORT INSERTION  03/13/2020  . LYMPH NODE BIOPSY Left 03/20/2020   Procedure: LEFT POSTERIOR CERVICAL LYMPH NODE BIOPSY;  Surgeon: Georganna Skeans, MD;  Location: [facility];  Service: General;  Laterality: Left;  Marland Kitchen MALONEY DILATION N/A 03/30/2015   Procedure: Venia Minks DILATION;  Surgeon: Daneil Dolin, MD;  [facility];  Service: Endoscopy;  Laterality: N/A;  . POLYPECTOMY  05/02/2020   Procedure: POLYPECTOMY;  Surgeon: Harvel Quale, MD;  [facility];  Service: Gastroenterology;;   Family History  Problem Relation Age of Onset  . Heart disease Mother   . Osteoarthritis Mother   . Sudden death Father   .  Single kidney Father   . Other Father        h/o severe MVA injuries  . Hyperlipidemia Sister   . Other Daughter        Myalgias  . Fibromyalgia Daughter   . Allergies Daughter   . Heart disease Maternal Grandfather   . Sudden death Paternal Grandmother   . Diabetes Paternal Grandfather   . Heart disease Daughter   . Other Daughter        palpitations  . Pulmonary fibrosis Maternal Aunt   . Cancer Paternal Uncle   . Pulmonary fibrosis Maternal Aunt   . Colon cancer Neg Hx    Social History   Social History Narrative   Ms. Nolting is widowed. Her young grandson lives with her, for whom she shares custody with her daughter, the son's aunt.      Allergies as of 11/13/2020      Reactions    Codeine Phosphate Nausea And Vomiting, Rash      Medication List       Accurate as of November 13, 2020 12:56 PM. If you have any questions, ask your nurse or doctor.        STOP taking these medications   magic mouthwash w/lidocaine Soln Stopped by: Howard Pouch, DO   pantoprazole 40 MG tablet Commonly known as: Protonix Stopped by: Howard Pouch, DO     TAKE these medications   acetaminophen 325 MG tablet Commonly known as: TYLENOL Take 325 mg by mouth every 6 (six) hours as needed for moderate pain.   albuterol 108 (90 Base) MCG/ACT inhaler Commonly known as: VENTOLIN HFA Inhale 2 puffs into the lungs every 6 (six) hours as needed for wheezing.   allopurinol 300 MG tablet Commonly known as: ZYLOPRIM Take 1 tablet (300 mg total) by mouth daily.   amLODipine 10 MG tablet Commonly known as: NORVASC Take 1 tablet (10 mg total) by mouth daily.   cetirizine 10 MG tablet Commonly known as: ZYRTEC Take 10 mg by mouth at bedtime.   escitalopram 10 MG tablet Commonly known as: Lexapro Take 1 tablet (10 mg total) by mouth daily.   levothyroxine 75 MCG tablet Commonly known as: SYNTHROID Take 1 tablet (75 mcg total) by mouth daily.   lidocaine-prilocaine cream Commonly known as: EMLA Apply 1 application topically as needed. What changed: reasons to take this   potassium chloride SA 20 MEQ tablet Commonly known as: KLOR-CON Take 1 tablet (20 mEq total) by mouth daily.   rOPINIRole 0.25 MG tablet Commonly known as: Requip Take 1 tablet (0.25 mg total) by mouth at bedtime. What changed:   when to take this  reasons to take this   sucralfate 1 g tablet Commonly known as: CARAFATE Take 1 g by mouth 4 (four) times daily -  with meals and at bedtime.   vitamin B-12 1000 MCG tablet Commonly known as: CYANOCOBALAMIN Take 1,000 mcg by mouth daily.   Vitamin D3 25 MCG (1000 UT) Caps Take 1,000 Units by mouth daily.       All past medical history, surgical  history, allergies, family history, immunizations andmedications were updated in the EMR today and reviewed under the history and medication portions of their EMR.     ROS: 14 pt review of systems performed and negative (unless mentioned in an HPI)  Objective: BP (!) 170/79   Pulse 66   Temp (!) 97.4 F (36.3 C) (Oral)   Ht 5\' 2"  (1.575 m)   SpO2 100%  BMI 24.18 kg/m  Gen: Afebrile. No acute distress.  Nontoxic.  Pleasant female. HENT: AT. [place].  No cough.  No hoarseness. Eyes:Pupils Equal Round Reactive to light, Extraocular movements intact,  Conjunctiva without redness, discharge or icterus. Neck/lymp/endocrine: Supple, no lymphadenopathy, no thyromegaly CV: RRR no murmur, no edema, +2/4 P posterior tibialis pulses Chest: CTAB, no wheeze or crackles Skin: No rashes, purpura or petechiae.  Neuro:  Normal gait. PERLA. EOMi. Alert. Oriented x3 Psych: Normal affect, dress and demeanor. Normal speech. Normal thought content and judgment.    No exam data present  Assessment/plan: SYA NESTLER is a 80 y.o. female present for CPE  Mild intermittent asthma without complication Stable Continue albuterol as needed.    Essential hypertension, benign/hyperlipidemia/carotid artery stenosis Noncompliance with medication.  Patient was encouraged to start a pill box in which her family will help her set up.  She has not been taking her blood pressure medicine. -Restart amlodipine 10 mg daily. -She had been on Crestor.  We will continue to hold this for now since she is having some memory concerns.  She understands we very well may be restarting this after neurology evaluates her. -Low-sodium diet, routine exercise. -LDL collected today. -Reviewed recent CBC and CMP collected by oncology  Acquired hypothyroidism Last TSH within normal range.  We will continue levothyroxine 75 mcg daily if laboratory results are normal.   TSH collected today  CKD stage 3 secondary (HCC)/vitamin D  deficiency/secondary hyperparathyroidism/osteopenia - Renally dose meds when appropriate. - Baseline GFR approximately 40, baseline creatinine 1.38 - Avoid NSAIDs - continue vitamin D 2000 units daily -PTH/calcium and vitamin D labs collected today.  Prediabetes A1c 6.20 April 2019 >5.7 > 6.0> collected today. She has been diet controlled  Gastroesophageal reflux disease with esophagitis Managed by gastroenterology.  Has an upcoming appointment.  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  Her daughter is also present today and concerned about her depression.  She wants to think about restarting Lexapro.  She has a refill.  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 She has history of B12 and vitamin D deficiency.  She also has a history of depression, hypothyroidism and hyperparathyroidism. All possible causes contributing to her changing cognition. We had been able to get her vitamin deficiencies and thyroid disorder in normal range.  There had not been any positive change in her cognitive function despite those efforts. 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 Patient had been on a statin and it was discontinued secondary to trial off of medicine to see if it would help with her cognitive decline.  Which she did not. Patient is not on aspirin or blood thinner at this time secondary to severe GERD, Schatzki's ring and GI bleed.  She follows up with her gastroenterologist this week. Her family reports she is in remission from  her lymphoma.  And he would now like to proceed with further neurological evaluation for her cognitive decline. Referral to neurology placed today.  Return in about 6 months (around 05/06/2021) for CMC (30 min).  Orders Placed This Encounter  Procedures  . TSH  . PTH, Intact  and Calcium  . Hemoglobin A1c  . Direct LDL  . Vitamin D (25 hydroxy)  . B12  . Ambulatory referral to Neurology    Orders Placed This Encounter  Procedures  . TSH  . PTH, Intact and Calcium  . Hemoglobin A1c  . Direct LDL  . Vitamin D (25 hydroxy)  . B12  . Ambulatory referral to Neurology   Meds ordered this encounter  Medications  . rOPINIRole (REQUIP) 0.25 MG tablet    Sig: Take 1 tablet (0.25 mg total) by mouth at bedtime.    Dispense:  90 tablet    Refill:  1  . amLODipine (NORVASC) 10 MG tablet    Sig: Take 1 tablet (10 mg total) by mouth daily.    Dispense:  90 tablet    Refill:  1  . levothyroxine (SYNTHROID) 75 MCG tablet    Sig: Take 1 tablet (75 mcg total) by mouth daily.    Dispense:  90 tablet    Refill:  3    Please to discontinue all prior levothyroxine scripts.  Thank you    Referral Orders     Ambulatory referral to Neurology   Electronically signed by: Howard Pouch, DO [place]

## 2020-11-14 ENCOUNTER — Telehealth: Payer: Self-pay | Admitting: Family Medicine

## 2020-11-14 MED ORDER — ROSUVASTATIN CALCIUM 20 MG PO TABS: 20.0000 mg | ORAL_TABLET | Freq: Every day | ORAL | 3 refills | Status: DC

## 2020-11-14 MED ORDER — LEVOTHYROXINE SODIUM 88 MCG PO TABS: 88.0000 ug | ORAL_TABLET | Freq: Every day | ORAL | 4 refills | Status: DC

## 2020-11-14 NOTE — Telephone Encounter (Signed)
Spoke with pt regarding labs and instructions.   

## 2020-11-14 NOTE — Telephone Encounter (Signed)
Please call patient Liver and kidney function are normal Diabetes screening/A1c is normal  Bad cholesterol/LDL is elevated at 155.  We had stopped her statin temporarily when she initially reported memory changes.  Since we now note is not responsible for any memory deficits, and her LDL is higher than goal, I would recommend her restarting Crestor at 20 mg before bed.  I have called this in for her. Thyroid function is a little under supplemented, I have called in the next dose up on her thyroid medicine.  You should start the new dose as soon as possible. Her vitamin D levels are normal.  Continue what ever dose she is currently taking. Her B12 levels are mediocre.  If she is taking the B12 daily continue and add an extra dose weekly.  If she is not taking the B12 daily, I would recommend she  start taking a B complex daily.   one lab is still pending, this is her parathyroid hormone.  We will call her once we receive those results.  Follow-up in 3 months with provider.  We will need to recheck her thyroid levels at that time to ensure proper dosing.

## 2020-11-15 ENCOUNTER — Telehealth: Payer: Self-pay | Admitting: Family Medicine

## 2020-11-15 ENCOUNTER — Ambulatory Visit: Payer: Medicare Other | Admitting: Internal Medicine

## 2020-11-15 ENCOUNTER — Other Ambulatory Visit: Payer: Self-pay | Admitting: Hematology and Oncology

## 2020-11-15 ENCOUNTER — Encounter: Payer: Self-pay | Admitting: Internal Medicine

## 2020-11-15 ENCOUNTER — Other Ambulatory Visit: Payer: Self-pay

## 2020-11-15 VITALS — BP 181/82 | HR 77 | Temp 97.1°F | Ht 62.0 in | Wt 129.2 lb

## 2020-11-15 DIAGNOSIS — C8580 Other specified types of non-Hodgkin lymphoma, unspecified site: Secondary | ICD-10-CM

## 2020-11-15 DIAGNOSIS — R1319 Other dysphagia: Secondary | ICD-10-CM

## 2020-11-15 DIAGNOSIS — K2970 Gastritis, unspecified, without bleeding: Secondary | ICD-10-CM

## 2020-11-15 DIAGNOSIS — K2990 Gastroduodenitis, unspecified, without bleeding: Secondary | ICD-10-CM | POA: Diagnosis not present

## 2020-11-15 DIAGNOSIS — R4189 Other symptoms and signs involving cognitive functions and awareness: Secondary | ICD-10-CM

## 2020-11-15 LAB — TIQ-MISC

## 2020-11-15 MED ORDER — PANTOPRAZOLE SODIUM 40 MG PO TBEC: 40.0000 mg | DELAYED_RELEASE_TABLET | Freq: Every day | ORAL | 3 refills | Status: DC

## 2020-11-15 NOTE — Telephone Encounter (Signed)
Received call from [facility] regarding recent referral for this patient. They were inquiring if Dr. Raoul Pitch had received message from Dr. Delice Lesch and if she would be able to respond. Please see forwarded message below:  From: Cameron Sprang, MD Sent: 11/15/2020  10:51 AM EST To: Virl Son, [facility]Referrals Float  I sent a message to referring MD asking if she felt Neuropsychological testing would be more helpful and to put in a referral for Neuropsych if yes. She has not responded. Can you pls call office and see if they are okay with doing Neuropsych evaluation for memory loss referral? Thanks

## 2020-11-15 NOTE — Progress Notes (Signed)
Referring Provider: Ma Hillock, DO Primary Care Physician:  Ma Hillock, DO Primary GI:  Dr. Abbey Chatters  Chief Complaint  Patient presents with  . Follow-up    Needs repeat EGD    HPI:   Elizabeth Mcintyre is a 80 y.o. female who presents to clinic today for follow-up visit.  She has chronic dysphagia related to chronic GERD as well as multiple Schatzki's rings.  She previously underwent EGD with dilation up to 15 mm with improvement in her symptoms.  Mucosal disruption was noted on EGD.  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  No H. pylori testing that I can see in her chart.  Was previously taking pantoprazole but is no longer taking this.  Past Medical History:  Diagnosis Date  . Asthma   . Bloating 04/26/2019  . 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 Cancer ([place]) 2021   Lymphoma  . 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 DIABETES MELLITUS, TYPE II 11/09/2007   diet control  . Diverticulosis 03/2011  . Fecal incontinence 07/07/2019  . GERD (gastroesophageal reflux disease)    none recently  . GI bleed   . Hematemesis 03/30/2020  . History of rheumatoid arthritis    during 30's, was treated.  . Hyperlipidemia   . Hypertension   . IBS (irritable bowel syndrome)   . 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 Lower GI bleeding 04/30/2020  . Lymphadenopathy 01/23/2020  . Osteopenia 2017   Last  bone density 05/04/2017: -2.4  . PONV (postoperative nausea and vomiting)   . Schatzki's ring   . Stress incontinence   . Stroke [facility])    mini stroke - found on a CT scan    Past Surgical History:  Procedure Laterality Date  . ABDOMINAL HYSTERECTOMY    . BALLOON DILATION N/A 08/21/2020   Procedure: BALLOON DILATION;  Surgeon: Eloise Harman, DO;  [facility];  Service: Endoscopy;  Laterality: N/A;  . BIOPSY  03/30/2020   Procedure: BIOPSY;  Surgeon: Ronald Lobo, MD;   Location: [facility];  Service: Endoscopy;;  . BIOPSY  05/02/2020   Procedure: BIOPSY;  Surgeon: Harvel Quale, MD;  Location: [facility];  Service: Gastroenterology;;  . CARDIAC CATHETERIZATION     X 2, last one in 1998  . CHOLECYSTECTOMY N/A 04/13/2017   Procedure: LAPAROSCOPIC CHOLECYSTECTOMY;  Surgeon: Aviva Signs, MD;  Location: [facility];  Service: General;  Laterality: N/A;  . COLONOSCOPY    . COLONOSCOPY  May 2012   Dr. Olevia Perches: mild diverticulosis, otherwise normal.   . COLONOSCOPY WITH PROPOFOL N/A 05/02/2020   Dr. Jenetta Downer: 8 mm polyp removed from the ascending colon, 2 mm polyp removed from the ascending colon.  Tubular adenomas.  Diverticulosis.  Mucosal ulceration in the sigmoid colon noted, pathology consistent with ischemic colitis.  Marland Kitchen ESOPHAGOGASTRODUODENOSCOPY N/A 01/28/2015   Dr. Gala Romney: reflux esophagitis, Schatzki's ring not manipulated due to recent bleeding  . ESOPHAGOGASTRODUODENOSCOPY N/A 03/30/2015   Dr. Gala Romney: Schatzki's ring s/p Venia Minks dilation, previously noted esophageal ulcer completely healed  . ESOPHAGOGASTRODUODENOSCOPY N/A 03/30/2020   Buccini: Moderately severe erosive, circumferential, confluent esophagitis with no bleeding found 25 to 40 cm from incisors.  Nonobstructing and mild Schatzki ring, there were also multiple distal esophageal rings noted, minimal hiatal hernia.  Marland Kitchen ESOPHAGOGASTRODUODENOSCOPY (EGD) WITH PROPOFOL N/A 08/21/2020   Procedure: ESOPHAGOGASTRODUODENOSCOPY (EGD)  WITH PROPOFOL;  Surgeon: Eloise Harman, DO;  [facility];  Service: Endoscopy;  Laterality: N/A;  2:15pm  . IR IMAGING GUIDED PORT INSERTION  03/13/2020  . LYMPH NODE BIOPSY Left 03/20/2020   Procedure: LEFT POSTERIOR CERVICAL LYMPH NODE BIOPSY;  Surgeon: Georganna Skeans, MD;  Location: [place];  Service: General;  Laterality: Left;  Marland Kitchen MALONEY DILATION N/A 03/30/2015   Procedure: Venia Minks DILATION;  Surgeon: Daneil Dolin, MD;  [facility];   Service: Endoscopy;  Laterality: N/A;  . POLYPECTOMY  05/02/2020   Procedure: POLYPECTOMY;  Surgeon: Harvel Quale, MD;  [facility];  Service: Gastroenterology;;    Current Outpatient Medications  Medication Sig Dispense Refill  . acetaminophen (TYLENOL) 325 MG tablet Take 325 mg by mouth every 6 (six) hours as needed for moderate pain.     Marland Kitchen albuterol (VENTOLIN HFA) 108 (90 Base) MCG/ACT inhaler Inhale 2 puffs into the lungs every 6 (six) hours as needed for wheezing. 8.5 g 2  . allopurinol (ZYLOPRIM) 300 MG tablet Take 1 tablet (300 mg total) by mouth daily. 30 tablet 3  . amLODipine (NORVASC) 10 MG tablet Take 1 tablet (10 mg total) by mouth daily. 90 tablet 1  . cetirizine (ZYRTEC) 10 MG tablet Take 10 mg by mouth at bedtime. As needed    . Cholecalciferol (VITAMIN D3) 25 MCG (1000 UT) CAPS Take 1,000 Units by mouth daily.     Marland Kitchen levothyroxine (SYNTHROID) 88 MCG tablet Take 1 tablet (88 mcg total) by mouth daily. 90 tablet 4  . lidocaine-prilocaine (EMLA) cream Apply 1 application topically as needed. (Patient taking differently: Apply 1 application topically as needed (port access).) 30 g 1  . pantoprazole (PROTONIX) 40 MG tablet Take 1 tablet (40 mg total) by mouth daily. 90 tablet 3  . potassium chloride SA (KLOR-CON) 20 MEQ tablet Take 1 tablet (20 mEq total) by mouth daily. 21 tablet 0  . rOPINIRole (REQUIP) 0.25 MG tablet Take 1 tablet (0.25 mg total) by mouth at bedtime. 90 tablet 1  . rosuvastatin (CRESTOR) 20 MG tablet Take 1 tablet (20 mg total) by mouth daily. 90 tablet 3  . sucralfate (CARAFATE) 1 g tablet Take 1 g by mouth 4 (four) times daily -  with meals and at bedtime. As needed    . vitamin B-12 (CYANOCOBALAMIN) 1000 MCG tablet Take 1,000 mcg by mouth daily.     Marland Kitchen escitalopram (LEXAPRO) 10 MG tablet Take 1 tablet (10 mg total) by mouth daily. (Patient not taking: Reported on 11/15/2020) 90 tablet 0   No current facility-administered medications for  this visit.    Allergies as of 11/15/2020 - Review Complete 11/15/2020  Allergen Reaction Noted  . Codeine phosphate Nausea And Vomiting and Rash     Family History  Problem Relation Age of Onset  . Heart disease Mother   . Osteoarthritis Mother   . Sudden death Father   . Single kidney Father   . Other Father        h/o severe MVA injuries  . Hyperlipidemia Sister   . Other Daughter        Myalgias  . Fibromyalgia Daughter   . Allergies Daughter   . Heart disease Maternal Grandfather   . Sudden death Paternal Grandmother   . Diabetes Paternal Grandfather   . Heart disease Daughter   . Other Daughter        palpitations  . Pulmonary fibrosis Maternal Aunt   .  Cancer Paternal Uncle   . Pulmonary fibrosis Maternal Aunt   . Colon cancer Neg Hx     Social History   Socioeconomic History  . Marital status: Widowed    Spouse name: Not on file  . Number of children: 2  . Years of education: Not on file  . Highest education level: Not on file  Occupational History  . Occupation: retired  Tobacco Use  . Smoking status: Never Smoker  . Smokeless tobacco: Never Used  Vaping Use  . Vaping Use: Never used  Substance and Sexual Activity  . Alcohol use: No  . Drug use: No  . Sexual activity: Never  Other Topics Concern  . Not on file  Social History Narrative   Ms. Hoff is widowed. Her young grandson lives with her, for whom she shares custody with her daughter, the son's aunt.     Social Determinants of Health   Financial Resource Strain: Not on file  Food Insecurity: Not on file  Transportation Needs: Not on file  Physical Activity: Not on file  Stress: Not on file  Social Connections: Not on file    Subjective: Review of Systems  Constitutional: Negative for chills and fever.  HENT: Negative for congestion and hearing loss.   Eyes: Negative for blurred vision and double vision.  Respiratory: Negative for cough and shortness of breath.   Cardiovascular:  Negative for chest pain and palpitations.  Gastrointestinal: Negative for abdominal pain, blood in stool, constipation, diarrhea, heartburn, melena and vomiting.  Genitourinary: Negative for dysuria and urgency.  Musculoskeletal: Negative for joint pain and myalgias.  Skin: Negative for itching and rash.  Neurological: Negative for dizziness and headaches.  Psychiatric/Behavioral: Negative for depression. The patient is not nervous/anxious.      Objective: BP (!) 181/82   Pulse 77   Temp (!) 97.1 F (36.2 C)   Ht 5\' 2"  (1.575 m)   Wt 129 lb 3.2 oz (58.6 kg)   BMI 23.63 kg/m  Physical Exam Constitutional:      Appearance: Normal appearance.  HENT:     Head: Normocephalic and atraumatic.  Eyes:     Extraocular Movements: Extraocular movements intact.     Conjunctiva/sclera: Conjunctivae normal.  Cardiovascular:     Rate and Rhythm: Normal rate and regular rhythm.  Pulmonary:     Effort: Pulmonary effort is normal.     Breath sounds: Normal breath sounds.  Abdominal:     General: Bowel sounds are normal.     Palpations: Abdomen is soft.  Musculoskeletal:        General: No swelling. Normal range of motion.     Cervical back: Normal range of motion and neck supple.  Skin:    General: Skin is warm and dry.     Coloration: Skin is not jaundiced.  Neurological:     General: No focal deficit present.     Mental Status: She is alert and oriented to person, place, and time.  Psychiatric:        Mood and Affect: Mood normal.        Behavior: Behavior normal.      Assessment: *Dysphagia-chronic, improved status post esophageal dilation *Chronic GERD *Marginal zone lymphoma  Plan: In regards to patient's chronic dysphagia, this is improved status post esophageal dilation up to 15 mm.  She does have multiple Schatzki's rings.  I recommended that she go back on the Protonix 40 mg daily.  We will continue to monitor this.  She may  need subsequent dilations in the future and  she understands.  She will call the office if her symptoms worsen and we will schedule her for EGD.  We will check H. pylori breath test given her marginal zone lymphoma as I do not see where this is been checked in the past.  Patient to hold restarting her pantoprazole until breath test completed.  Patient to follow-up in 6 months or sooner if needed.  11/15/2020 3:57 PM   Disclaimer: This note was dictated with voice recognition software. Similar sounding words can inadvertently be transcribed and may not be corrected upon review.

## 2020-11-15 NOTE — Telephone Encounter (Signed)
Please advise 

## 2020-11-15 NOTE — Patient Instructions (Signed)
I am going to order a breath test to check for a bacteria called H. pylori.  When she go back on you Protonix once daily and have sent this to your pharmacy.  Wait until you complete the breath test before starting this medication.  Continue to monitor your swallowing.  If you feel like you are having more frequent episodes of pills or food getting stuck in your substernal region, give Elizabeth Mcintyre a call and we will set you up for repeat EGD with dilation.  Otherwise follow-up in 6 months or sooner if needed.  At [facility] we value your feedback. You may receive a survey about your visit today. Please share your experience as we strive to create trusting relationships with our patients to provide genuine, compassionate, quality care.  We appreciate your understanding and patience as we review any laboratory studies, imaging, and other diagnostic tests that are ordered as we care for you. Our office policy is 5 business days for review of these results, and any emergent or urgent results are addressed in a timely manner for your best interest. If you do not hear from our office in 1 week, please contact Elizabeth Mcintyre.   We also encourage the use of MyChart, which contains your medical information for your review as well. If you are not enrolled in this feature, an access code is on this after visit summary for your convenience. Thank you for allowing Elizabeth Mcintyre to be involved in your care.  It was great to see you today!  I hope you have a great rest of your spring!!    Elon Alas. Abbey Chatters, D.O. Gastroenterology and Hepatology [facility]

## 2020-11-16 ENCOUNTER — Inpatient Hospital Stay: Payer: Medicare Other

## 2020-11-16 ENCOUNTER — Inpatient Hospital Stay: Payer: Medicare Other | Attending: Hematology and Oncology

## 2020-11-16 ENCOUNTER — Encounter: Payer: Self-pay | Admitting: Hematology and Oncology

## 2020-11-16 ENCOUNTER — Inpatient Hospital Stay: Payer: Medicare Other | Admitting: Hematology and Oncology

## 2020-11-16 ENCOUNTER — Other Ambulatory Visit: Payer: Self-pay

## 2020-11-16 VITALS — BP 154/75 | HR 75 | Temp 96.5°F | Resp 17 | Ht 62.0 in | Wt 129.0 lb

## 2020-11-16 VITALS — BP 148/65 | HR 75 | Temp 98.0°F | Resp 16

## 2020-11-16 DIAGNOSIS — Z5112 Encounter for antineoplastic immunotherapy: Secondary | ICD-10-CM

## 2020-11-16 DIAGNOSIS — C8580 Other specified types of non-Hodgkin lymphoma, unspecified site: Secondary | ICD-10-CM

## 2020-11-16 DIAGNOSIS — C8514 Unspecified B-cell lymphoma, lymph nodes of axilla and upper limb: Secondary | ICD-10-CM | POA: Diagnosis not present

## 2020-11-16 DIAGNOSIS — Z95828 Presence of other vascular implants and grafts: Secondary | ICD-10-CM

## 2020-11-16 LAB — CBC WITH DIFFERENTIAL (CANCER CENTER ONLY)
Abs Immature Granulocytes: 0.1 10*3/uL — ABNORMAL HIGH (ref 0.00–0.07)
Band Neutrophils: 3 %
Basophils Absolute: 0 10*3/uL (ref 0.0–0.1)
Basophils Relative: 0 %
Eosinophils Absolute: 0.2 10*3/uL (ref 0.0–0.5)
Eosinophils Relative: 4 %
HCT: 37.7 % (ref 36.0–46.0)
Hemoglobin: 13.7 g/dL (ref 12.0–15.0)
Lymphocytes Relative: 22 %
Lymphs Abs: 1.2 10*3/uL (ref 0.7–4.0)
MCH: 31.1 pg (ref 26.0–34.0)
MCHC: 36.3 g/dL — ABNORMAL HIGH (ref 30.0–36.0)
MCV: 85.7 fL (ref 80.0–100.0)
Metamyelocytes Relative: 1 %
Monocytes Absolute: 0.4 10*3/uL (ref 0.1–1.0)
Monocytes Relative: 7 %
Myelocytes: 1 %
Neutro Abs: 3.4 10*3/uL (ref 1.7–7.7)
Neutrophils Relative %: 62 %
Platelet Count: 156 10*3/uL (ref 150–400)
RBC: 4.4 MIL/uL (ref 3.87–5.11)
RDW: 12.6 % (ref 11.5–15.5)
WBC Count: 5.3 10*3/uL (ref 4.0–10.5)
nRBC: 0 % (ref 0.0–0.2)

## 2020-11-16 LAB — CMP (CANCER CENTER ONLY)
ALT: 13 U/L (ref 10–47)
AST: 20 U/L (ref 11–38)
Albumin: 4.1 g/dL (ref 3.5–5.0)
Alkaline Phosphatase: 75 U/L (ref 38–126)
Anion gap: 13 (ref 5–15)
BUN: 12 mg/dL (ref 8–23)
CO2: 20 mmol/L — ABNORMAL LOW (ref 22–32)
Calcium: 9.2 mg/dL (ref 8.9–10.3)
Chloride: 105 mmol/L (ref 98–111)
Creatinine: 1.15 mg/dL (ref 0.60–1.20)
GFR, Estimated: 49 mL/min — ABNORMAL LOW (ref >60)
Glucose, Bld: 112 mg/dL — ABNORMAL HIGH (ref 70–99)
Potassium: 3.2 mmol/L — ABNORMAL LOW (ref 3.5–5.1)
Sodium: 138 mmol/L (ref 135–145)
Total Bilirubin: 0.3 mg/dL (ref 0.2–1.6)
Total Protein: 7.1 g/dL (ref 6.5–8.1)

## 2020-11-16 LAB — LACTATE DEHYDROGENASE: LDH: 327 U/L — ABNORMAL HIGH (ref 98–192)

## 2020-11-16 LAB — H. PYLORI BREATH TEST: H. pylori Breath Test: NOT DETECTED

## 2020-11-16 MED ORDER — ACETAMINOPHEN 325 MG PO TABS
ORAL_TABLET | ORAL | Status: AC
  Filled 2020-11-16: qty 2

## 2020-11-16 MED ORDER — FAMOTIDINE IN NACL 20-0.9 MG/50ML-% IV SOLN
INTRAVENOUS | Status: AC
  Filled 2020-11-16: qty 50

## 2020-11-16 MED ORDER — SODIUM CHLORIDE 0.9 % IV SOLN
375.0000 mg/m2 | Freq: Once | INTRAVENOUS | Status: AC
  Administered 2020-11-16: 600 mg via INTRAVENOUS
  Filled 2020-11-16: qty 50

## 2020-11-16 MED ORDER — SODIUM CHLORIDE 0.9% FLUSH
10.0000 mL | INTRAVENOUS | Status: DC | PRN
  Administered 2020-11-16: 10 mL
  Filled 2020-11-16: qty 10

## 2020-11-16 MED ORDER — ACETAMINOPHEN 325 MG PO TABS
650.0000 mg | ORAL_TABLET | Freq: Once | ORAL | Status: AC
  Administered 2020-11-16: 650 mg via ORAL

## 2020-11-16 MED ORDER — DIPHENHYDRAMINE HCL 25 MG PO CAPS
ORAL_CAPSULE | ORAL | Status: AC
  Filled 2020-11-16: qty 2

## 2020-11-16 MED ORDER — DIPHENHYDRAMINE HCL 25 MG PO CAPS
50.0000 mg | ORAL_CAPSULE | Freq: Once | ORAL | Status: AC
  Administered 2020-11-16: 50 mg via ORAL

## 2020-11-16 MED ORDER — SODIUM CHLORIDE 0.9 % IV SOLN
Freq: Once | INTRAVENOUS | Status: AC
  Filled 2020-11-16: qty 250

## 2020-11-16 MED ORDER — HEPARIN SOD (PORK) LOCK FLUSH 100 UNIT/ML IV SOLN
500.0000 [IU] | Freq: Once | INTRAVENOUS | Status: AC | PRN
  Administered 2020-11-16: 500 [IU]
  Filled 2020-11-16: qty 5

## 2020-11-16 MED ORDER — FAMOTIDINE IN NACL 20-0.9 MG/50ML-% IV SOLN
20.0000 mg | Freq: Once | INTRAVENOUS | Status: AC
  Administered 2020-11-16: 20 mg via INTRAVENOUS

## 2020-11-16 NOTE — Progress Notes (Signed)
[place] [phone]   [phone]  PROGRESS NOTE  Patient Care Team: Ma Hillock, DO as PCP - General (Family Medicine) Danie Binder, MD (Inactive) as Consulting Physician (Gastroenterology) Minus Breeding, MD as Consulting Physician (Cardiology) Annitta Needs, NP (Gastroenterology) Carlis Stable, NP as Nurse Practitioner (Gastroenterology) Montez Morita, Quillian Quince, MD as Consulting Physician (Gastroenterology) Eloise Harman, DO as Consulting Physician (Internal Medicine)  Hematological/Oncological History  # Low Grade Marginal Zone Lymphoma, Stage III 1) 06/06/2019: patient underwent a diagnostic mammogram of the left breast. Calcifications noted in the left breast, recommended 6 month f/u imaging. 2) 02/02/2020: bilateral diagnostic mammogram showedabnormal enlarged lymph nodes with cortical thickening. The largest of these measures 2.2 x 1.7 x 2.0 cm. 3) 02/02/2020: US guided biopsy of left axillary lymph nodes performed. 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 4) 02/16/2020: establish care with Dr. Lorenso Courier 5) 02/29/2020: PET CT scan performed, showed hypermetabolic lymph nodes identified in the posterior left neck, both axillary/subpectoral regions, mediastinum, hila, right external iliac chain, and right groin. Massive splenomegaly of 17.8 cm noted as well.  6) 03/20/2020: excisional biopsy of left posterior cervical lymph node. Biopsy confirmed most likely low grade marginal zone lymphoma.   7) 7/14-7/15/2021: Cycle 1 Day 1 of Rituximab/Bendamycin 8) 03/30/2020-04/01/2020: admitted inpatient for hematemesis. Noted to have marked esophagitis on EGD.  9) 04/25/2020: IV feraheme 510mg  administered 10) 04/26/2020: Cycle 2 Day 1 of  Rituximab/Bendamycin 11) 05/10/2020: HOLD R-Benda in setting of colitis, GI bleed, and intolerance to therapy. Plan to start monotherapy Rituximab once patient has rebounded.  12) 07/06/2020: Cycle 1 Week 1 of monotherapy rituximab 13) 07/30/2020: Cycle 1 Week 3 of monotherapy rituximab 14) 08/06/2020: Completed Cycle 1 of monotherapy rituximab 15)  09/12/2020: PET CT scan shows completed resolution of tumor and FDG avid lesions. Deauville Score 1. Complete response.  16) 11/16/2020: Cycle 1 Day 1 of Maintenance Rituximab   Interval History:  Elizabeth Mcintyre 80 y.o. female with medical history significant for low grade marginal zone lymphoma stage IIIA who presents for a follow up visit prior to initiating maintenance Rituximab.   On exam today Mrs. Mccaig reports that she has been well in the interim since her last visit. She reports that her energy is overall stable. She does have fatigue but continues to complete her daily activities on her own. She has a good appetite with steady increase in her weight. She continues to have some memory issues for which her PCP has referred her to neurology. Patient reports mild right sided shoulder pain after putting her arm in a dryer while it was still running. She denies any difficulty with ambulation with the right upper extremity. Her PCP started her on Crestor that she will start to take today. She denies any fevers, chills, night sweats, nausea/vomiting, abdominal pain, diarrhea, constipation, shortness of breath, chest pain, cough, neuropathy or skin changes. The rest of the 10 point ROS is negative.  MEDICAL HISTORY:  Past Medical History:  Diagnosis Date  . Asthma   . Bloating 04/26/2019  . 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 Cancer ([place]) 2021   Lymphoma  . 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 DIABETES MELLITUS, TYPE II 11/09/2007   diet control  . Diverticulosis 03/2011  . Fecal incontinence 07/07/2019  .  GERD (gastroesophageal reflux disease)    none recently  . GI bleed   . Hematemesis 03/30/2020  . History of rheumatoid arthritis    during 30's, was treated.  . Hyperlipidemia   . Hypertension   . IBS (irritable bowel syndrome)   . 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 Lower GI bleeding 04/30/2020  . Lymphadenopathy 01/23/2020  . Osteopenia 2017   Last  bone density 05/04/2017: -2.4  . PONV (postoperative nausea and vomiting)   . Schatzki's ring   . Stress incontinence   . Stroke [facility])    mini stroke - found on a CT scan    SURGICAL HISTORY: Past Surgical History:  Procedure Laterality Date  . ABDOMINAL HYSTERECTOMY    . BALLOON DILATION N/A 08/21/2020   Procedure: BALLOON DILATION;  Surgeon: Eloise Harman, DO;  [facility];  Service: Endoscopy;  Laterality: N/A;  . BIOPSY  03/30/2020   Procedure: BIOPSY;  Surgeon: Ronald Lobo, MD;  Location: [facility];  Service: Endoscopy;;  . BIOPSY  05/02/2020   Procedure: BIOPSY;  Surgeon: Harvel Quale, MD;  Location: [facility];  Service: Gastroenterology;;  . CARDIAC CATHETERIZATION     X 2, last one in 1998  . CHOLECYSTECTOMY N/A 04/13/2017   Procedure: LAPAROSCOPIC CHOLECYSTECTOMY;  Surgeon: Aviva Signs, MD;  Location: [facility];  Service: General;  Laterality: N/A;  . COLONOSCOPY    . COLONOSCOPY  May 2012   Dr. Olevia Perches: mild diverticulosis, otherwise normal.   . COLONOSCOPY WITH PROPOFOL N/A 05/02/2020   Dr. Jenetta Downer: 8 mm polyp removed from the ascending colon, 2 mm polyp removed from the ascending colon.  Tubular adenomas.  Diverticulosis.  Mucosal ulceration in the sigmoid colon noted, pathology consistent with ischemic colitis.  Marland Kitchen ESOPHAGOGASTRODUODENOSCOPY N/A 01/28/2015   Dr. Gala Romney: reflux esophagitis, Schatzki's ring not manipulated due to recent bleeding  . ESOPHAGOGASTRODUODENOSCOPY N/A 03/30/2015   Dr. Gala Romney: Schatzki's ring s/p Venia Minks dilation, previously noted esophageal  ulcer completely healed  . ESOPHAGOGASTRODUODENOSCOPY N/A 03/30/2020   Buccini: Moderately severe erosive, circumferential, confluent esophagitis with no bleeding found 25 to 40 cm from incisors.  Nonobstructing and mild Schatzki ring, there were also multiple distal esophageal rings noted, minimal hiatal hernia.  Marland Kitchen ESOPHAGOGASTRODUODENOSCOPY (EGD) WITH PROPOFOL N/A 08/21/2020   Procedure: ESOPHAGOGASTRODUODENOSCOPY (EGD) WITH PROPOFOL;  Surgeon: Eloise Harman, DO;  [facility];  Service: Endoscopy;  Laterality: N/A;  2:15pm  . IR IMAGING GUIDED PORT INSERTION  03/13/2020  . LYMPH NODE BIOPSY Left 03/20/2020   Procedure: LEFT POSTERIOR CERVICAL LYMPH NODE BIOPSY;  Surgeon: Georganna Skeans, MD;  Location: [place];  Service: General;  Laterality: Left;  Marland Kitchen MALONEY DILATION N/A 03/30/2015   Procedure: Venia Minks DILATION;  Surgeon: Daneil Dolin, MD;  [facility];  Service: Endoscopy;  Laterality: N/A;  . POLYPECTOMY  05/02/2020   Procedure: POLYPECTOMY;  Surgeon: Harvel Quale, MD;  [facility];  Service: Gastroenterology;;    SOCIAL HISTORY: Social History   Socioeconomic History  . Marital status: Widowed    Spouse name: Not on file  . Number of children: 2  . Years of education: Not on file  . Highest education level: Not on file  Occupational History  . Occupation: retired  Tobacco Use  . Smoking status: Never Smoker  . Smokeless tobacco: Never Used  Vaping Use  . Vaping Use: Never used  Substance and Sexual Activity  . Alcohol use: No  .  Drug use: No  . Sexual activity: Never  Other Topics Concern  . Not on file  Social History Narrative   Ms. Lastinger is widowed. Her young grandson lives with her, for whom she shares custody with her daughter, the son's aunt.     Social Determinants of Health   Financial Resource Strain: Not on file  Food Insecurity: Not on file  Transportation Needs: Not on file  Physical Activity: Not on file   Stress: Not on file  Social Connections: Not on file  Intimate Partner Violence: Not on file    FAMILY HISTORY: Family History  Problem Relation Age of Onset  . Heart disease Mother   . Osteoarthritis Mother   . Sudden death Father   . Single kidney Father   . Other Father        h/o severe MVA injuries  . Hyperlipidemia Sister   . Other Daughter        Myalgias  . Fibromyalgia Daughter   . Allergies Daughter   . Heart disease Maternal Grandfather   . Sudden death Paternal Grandmother   . Diabetes Paternal Grandfather   . Heart disease Daughter   . Other Daughter        palpitations  . Pulmonary fibrosis Maternal Aunt   . Cancer Paternal Uncle   . Pulmonary fibrosis Maternal Aunt   . Colon cancer Neg Hx     ALLERGIES:  is allergic to codeine phosphate.  MEDICATIONS:  Current Outpatient Medications  Medication Sig Dispense Refill  . acetaminophen (TYLENOL) 325 MG tablet Take 325 mg by mouth every 6 (six) hours as needed for moderate pain.     Marland Kitchen albuterol (VENTOLIN HFA) 108 (90 Base) MCG/ACT inhaler Inhale 2 puffs into the lungs every 6 (six) hours as needed for wheezing. 8.5 g 2  . allopurinol (ZYLOPRIM) 300 MG tablet Take 1 tablet (300 mg total) by mouth daily. 30 tablet 3  . amLODipine (NORVASC) 10 MG tablet Take 1 tablet (10 mg total) by mouth daily. 90 tablet 1  . cetirizine (ZYRTEC) 10 MG tablet Take 10 mg by mouth at bedtime. As needed    . Cholecalciferol (VITAMIN D3) 25 MCG (1000 UT) CAPS Take 1,000 Units by mouth daily.     Marland Kitchen escitalopram (LEXAPRO) 10 MG tablet Take 1 tablet (10 mg total) by mouth daily. (Patient not taking: Reported on 11/15/2020) 90 tablet 0  . levothyroxine (SYNTHROID) 88 MCG tablet Take 1 tablet (88 mcg total) by mouth daily. 90 tablet 4  . lidocaine-prilocaine (EMLA) cream Apply 1 application topically as needed. (Patient taking differently: Apply 1 application topically as needed (port access).) 30 g 1  . pantoprazole (PROTONIX) 40 MG tablet  Take 1 tablet (40 mg total) by mouth daily. 90 tablet 3  . potassium chloride SA (KLOR-CON) 20 MEQ tablet Take 1 tablet (20 mEq total) by mouth daily. 21 tablet 0  . rOPINIRole (REQUIP) 0.25 MG tablet Take 1 tablet (0.25 mg total) by mouth at bedtime. 90 tablet 1  . rosuvastatin (CRESTOR) 20 MG tablet Take 1 tablet (20 mg total) by mouth daily. 90 tablet 3  . sucralfate (CARAFATE) 1 g tablet Take 1 g by mouth 4 (four) times daily -  with meals and at bedtime. As needed    . vitamin B-12 (CYANOCOBALAMIN) 1000 MCG tablet Take 1,000 mcg by mouth daily.      No current facility-administered medications for this visit.   Facility-Administered Medications Ordered in Other Visits  Medication Dose  Route Frequency Provider Last Rate Last Admin  . sodium chloride flush (NS) 0.9 % injection 10 mL  10 mL Intracatheter PRN Orson Slick, MD   10 mL at 11/16/20 1239    REVIEW OF SYSTEMS:   Constitutional: ( - ) fevers, ( - )  chills , ( - ) night sweats Eyes: ( - ) blurriness of vision, ( - ) double vision, ( - ) watery eyes Ears, nose, mouth, throat, and face: ( - ) mucositis, ( - ) sore throat Respiratory: ( - ) cough, ( - ) dyspnea, ( - ) wheezes Cardiovascular: ( - ) palpitation, ( - ) chest discomfort, ( - ) lower extremity swelling Gastrointestinal:  ( - ) nausea, ( - ) heartburn, ( - ) change in bowel habits Skin: ( - ) abnormal skin rashes Lymphatics: ( - ) new lymphadenopathy, ( - ) easy bruising Neurological: ( - ) numbness, ( - ) tingling, ( - ) new weaknesses Behavioral/Psych: ( - ) mood change, ( - ) new changes  All other systems were reviewed with the patient and are negative.  PHYSICAL EXAMINATION: ECOG PERFORMANCE STATUS: 1 - Symptomatic but completely ambulatory  Vitals:   11/16/20 0812  BP: (!) 154/75  Pulse: 75  Resp: 17  Temp: (!) 96.5 F (35.8 C)  SpO2: 100%   Filed Weights   11/16/20 0812  Weight: 129 lb (58.5 kg)    GENERAL: well appearing elderly Caucasian  female in NAD  SKIN: skin color, texture, turgor are normal. Small dime sized dry skin lesions on chest, about 5 in number. Improved from prior EYES: conjunctiva are pink and non-injected, sclera clear NECK: supple, non-tender.  LUNGS: clear to auscultation and percussion with normal breathing effort HEART: regular rate & rhythm and no murmurs and no lower extremity edema Musculoskeletal: no cyanosis of digits and no clubbing  PSYCH: alert & oriented x 3, fluent speech NEURO: no focal motor/sensory deficits  LABORATORY DATA:  I have reviewed the data as listed CBC Latest Ref Rng & Units 11/16/2020 10/25/2020 08/30/2020  WBC 4.0 - 10.5 K/uL 5.3 5.2 4.5  Hemoglobin 12.0 - 15.0 g/dL 13.7 12.4 12.4  Hematocrit 36.0 - 46.0 % 37.7 35.1(L) 35.5(L)  Platelets 150 - 400 K/uL 156 167 179    CMP Latest Ref Rng & Units 11/16/2020 11/13/2020 10/25/2020  Glucose 70 - 99 mg/dL 112(H) - 109(H)  BUN 8 - 23 mg/dL 12 - 17  Creatinine 0.60 - 1.20 mg/dL 1.15 - 1.20(H)  Sodium 135 - 145 mmol/L 138 - 141  Potassium 3.5 - 5.1 mmol/L 3.2(L) - 3.7  Chloride 98 - 111 mmol/L 105 - 107  CO2 22 - 32 mmol/L 20(L) - 25  Calcium 8.9 - 10.3 mg/dL 9.2 9.9 9.1  Total Protein 6.5 - 8.1 g/dL 7.1 - 6.5  Total Bilirubin 0.2 - 1.6 mg/dL 0.3 - 0.4  Alkaline Phos 38 - 126 U/L 75 - 63  AST 11 - 38 U/L 20 - 16  ALT 10 - 47 U/L 13 - 14    RADIOGRAPHIC STUDIES: No results found.  ASSESSMENT & PLAN Elizabeth Mcintyre 80 y.o. 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  Previously I recommend a rituximab monotherapy given her intolerance of BR x 2 cycles. This regimen consisted of rituximab 375 mg/m2 IV q weekly x 4 weeks. During her scans on 8/16 - 05/03/2020 she was shown to  have marked response to therapy with normalization of spleen size and resolution of lymphadenopathy. Repeat PET CT scan on 09/12/20 after completed of rituximab monotherapy showed a  completed response. We will proceed with q 2 months maintenance/consolidation rituximab x 4 cycles.   GELF Criteria: 1 (splenomegaly). Indication for treatment  # Low Grade Marginal Zone Lymphoma, Stage III -- discontinued bendamustine + rituximab as patient is unable to tolerate this treatment. Will pursue rituximab monotherapy instead. Started therapy with Cycle 1 Day 1 on 03/28/2020 BR, decreased to Ritux alone q weekly x 4 starting 07/06/2020. --patient completed excisional lymph node biopsy to confirm the diagnosis. Pathological exam of the lymph node confirms marginal zone lymphoma.  --PET CT scan confirms stage III disease. Patient meets GELF criteria for treatment (splenomegaly). She did not have any B symptoms and her counts were stable at diagnosis, with some mild thrombocytopenia.  --Patient completed monotherapy rituximab x 4 weeks on 08/06/2020.  --post treatment PET CT scan from 09/12/20 showed Deauville Score 1, complete response to therapy. Recommend to proceed with q 8 week rituximab 375mg /m2 maintenance therapy. This can be continued up to 4 doses --Patient returns today to start C1D1 of maintenance Ritxumab. Labs from today were reviewed without any intervention needed. Patient will proceed with treatment as planned and return to the clinic in 8 weeks prior to [place].   #Concern for GI Bleed, resolved #Hematemesis, resolved #Colitis, resolved --patient admitted on 03/30/2020 (day after Cycle 1 chemotherapy) with hematemesis --no overt GI bleeding noted on Upper GI evaluation -- no signs of bleeding since last visit.  --iron levels appeared low previously and patient was having symptoms of iron deficiency anemia including fatigue, restless leg, and dyspnea on exertion. -- Repeat labs today, Hgb continues to be within normal limits.   #Symptom Management -- zofran 8mg  PO q8H PRN and compazine 10mg  PO q6H PRN --  EMLA cream for patient's port site --allopurinol 300 mg PO daily  while on this regimen as a precaution for TLS --for abdominal pain/headaches, can take tylenol 650mg -1000mg  PO q8H PRN.   No orders of the defined types were placed in this encounter.   All questions were answered. The patient knows to call the clinic with any problems, questions or concerns.  A total of more than 30 minutes were spent on this encounter and over half of that time was spent on counseling and coordination of care as outlined above.   Ledell Peoples, MD Department of Hematology/Oncology [place] [facility] Phone: [phone] Pager: [phone] Email: [email or web address]  11/16/2020 1:43 PM   Literature Support:  NCCN Guidelines: Consolidation with rituximab should be offered if initially treated with single agent rituximab. (MS-63)  UptoDate: Different schedules have been used. The following administration schedules were used in the randomized trials and are equally acceptable approaches:  ?Rituximab 375 mg/m2 intravenously per week for a total of four doses  ?Rituximab 375 mg/m2 intravenously per week for four weeks, followed by four additional doses administered every two months

## 2020-11-16 NOTE — Telephone Encounter (Signed)
Responded to Dr. Delice Lesch in staff message.  Please keep neurology referral in place. Will also place neuro psych eval.

## 2020-11-16 NOTE — Patient Instructions (Signed)
[facility] Discharge Instructions for Patients Receiving Chemotherapy  Today you received the following chemotherapy agents Rituximab (Rituxan).  To help prevent nausea and vomiting after your treatment, we encourage you to take your nausea medication as prescribed.   If you develop nausea and vomiting that is not controlled by your nausea medication, call the clinic.   BELOW ARE SYMPTOMS THAT SHOULD BE REPORTED IMMEDIATELY:  *FEVER GREATER THAN 100.5 F  *CHILLS WITH OR WITHOUT FEVER  NAUSEA AND VOMITING THAT IS NOT CONTROLLED WITH YOUR NAUSEA MEDICATION  *UNUSUAL SHORTNESS OF BREATH  *UNUSUAL BRUISING OR BLEEDING  TENDERNESS IN MOUTH AND THROAT WITH OR WITHOUT PRESENCE OF ULCERS  *URINARY PROBLEMS  *BOWEL PROBLEMS  UNUSUAL RASH Items with * indicate a potential emergency and should be followed up as soon as possible.  Feel free to call the clinic should you have any questions or concerns. The clinic phone number is [phone].  Please show the CHEMO ALERT CARD at check-in to the Emergency Department and triage nurse.   

## 2020-11-19 LAB — PTH, INTACT AND CALCIUM
Calcium: 9.9 mg/dL (ref 8.6–10.4)
PTH: 53 pg/mL (ref 14–64)

## 2020-11-19 LAB — EXTRA SPECIMEN

## 2020-11-29 ENCOUNTER — Telehealth: Payer: Self-pay | Admitting: Internal Medicine

## 2020-11-29 NOTE — Telephone Encounter (Signed)
Pt was returning a call regarding her results. [phone]

## 2020-11-30 NOTE — Telephone Encounter (Signed)
See result note.  

## 2020-12-04 NOTE — Progress Notes (Signed)
Dear Vickii Chafe,   We have tried to reach you on several occasions regarding your results. Please call our office at your convenice.     Thank you, Oleh Genin, CMA

## 2020-12-18 ENCOUNTER — Ambulatory Visit: Payer: Medicare Other | Admitting: Internal Medicine

## 2020-12-24 ENCOUNTER — Other Ambulatory Visit: Payer: Self-pay

## 2020-12-24 ENCOUNTER — Emergency Department (HOSPITAL_COMMUNITY)
Admission: EM | Admit: 2020-12-24 | Discharge: 2020-12-24 | Discharge status: Against Medical Advice | Payer: Medicare Other

## 2020-12-24 NOTE — ED Triage Notes (Signed)
Left prior to triage

## 2020-12-25 ENCOUNTER — Encounter: Payer: Self-pay | Admitting: Counselor

## 2020-12-25 ENCOUNTER — Encounter: Payer: Self-pay | Admitting: Neurology

## 2021-01-02 ENCOUNTER — Encounter: Payer: Self-pay | Admitting: Internal Medicine

## 2021-01-02 ENCOUNTER — Ambulatory Visit (INDEPENDENT_AMBULATORY_CARE_PROVIDER_SITE_OTHER): Payer: Medicare Other | Admitting: Internal Medicine

## 2021-01-02 ENCOUNTER — Other Ambulatory Visit: Payer: Self-pay

## 2021-01-02 VITALS — BP 163/78 | HR 75 | Temp 98.1°F | Resp 18 | Ht 62.5 in | Wt 132.1 lb

## 2021-01-02 DIAGNOSIS — I10 Essential (primary) hypertension: Secondary | ICD-10-CM | POA: Diagnosis not present

## 2021-01-02 DIAGNOSIS — G2581 Restless legs syndrome: Secondary | ICD-10-CM

## 2021-01-02 DIAGNOSIS — E039 Hypothyroidism, unspecified: Secondary | ICD-10-CM | POA: Diagnosis not present

## 2021-01-02 DIAGNOSIS — C8580 Other specified types of non-Hodgkin lymphoma, unspecified site: Secondary | ICD-10-CM | POA: Diagnosis not present

## 2021-01-02 DIAGNOSIS — Z0100 Encounter for examination of eyes and vision without abnormal findings: Secondary | ICD-10-CM

## 2021-01-02 DIAGNOSIS — E119 Type 2 diabetes mellitus without complications: Secondary | ICD-10-CM | POA: Diagnosis not present

## 2021-01-02 DIAGNOSIS — N1831 Chronic kidney disease, stage 3a: Secondary | ICD-10-CM | POA: Diagnosis not present

## 2021-01-02 DIAGNOSIS — Z7689 Persons encountering health services in other specified circumstances: Secondary | ICD-10-CM | POA: Diagnosis not present

## 2021-01-02 DIAGNOSIS — R7303 Prediabetes: Secondary | ICD-10-CM | POA: Diagnosis not present

## 2021-01-02 DIAGNOSIS — J4520 Mild intermittent asthma, uncomplicated: Secondary | ICD-10-CM | POA: Diagnosis not present

## 2021-01-02 DIAGNOSIS — E782 Mixed hyperlipidemia: Secondary | ICD-10-CM

## 2021-01-02 HISTORY — DX: Restless legs syndrome: G25.81

## 2021-01-02 NOTE — Progress Notes (Signed)
New Patient Office Visit  Subjective:  Patient ID: Elizabeth Mcintyre, female    DOB: 07/21/1941  Age: 80 y.o. MRN: [id number]  CC:  Chief Complaint  Patient presents with  . 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  She complains of generalized headache for last few months.  Of note, she has not been taking her amlodipine regularly.  Her blood pressure was elevated in the office today.  She denies any chest pain, dyspnea or palpitations.  She follows up with oncology in [place] for marginal zone lymphoma, for which she is undergoing rituximab chemotherapy.  She had been losing weight, but states that her weight has been stable recently.  She denies any fever, night sweats or severe fatigue.  She takes ropinirole for restless leg syndrome.  She takes levothyroxine 88 mcg for hypothyroidism.  She is up-to-date with COVID-vaccine.  Past Medical History:  Diagnosis Date  . Asthma   . Bloating 04/26/2019  . 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 Cancer ([place]) 2021   Lymphoma  . 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 DIABETES MELLITUS, TYPE II 11/09/2007   diet control  . Diverticulosis 03/2011  . Fecal incontinence 07/07/2019  . GERD (gastroesophageal reflux disease)    none recently  . GI bleed   . Gout 06/09/2014   R great toe, ball of foot Stopped HCTZ several mos ago,  On losartan       . Hematemesis 03/30/2020  . History of rheumatoid arthritis    during 30's, was treated.  . Hyperlipidemia   . Hypertension   . IBS (irritable bowel syndrome)   . 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 Lower GI  bleeding 04/30/2020  . Lymphadenopathy 01/23/2020  . Osteopenia 2017   Last  bone density 05/04/2017: -2.4  . PONV (postoperative nausea and vomiting)   . Schatzki's ring   . Stress incontinence   . Stroke [facility])    mini stroke - found on a CT scan    Past Surgical History:  Procedure Laterality Date  . ABDOMINAL HYSTERECTOMY    . BALLOON DILATION N/A 08/21/2020   Procedure: BALLOON DILATION;  Surgeon: Eloise Harman, DO;  [facility];  Service: Endoscopy;  Laterality: N/A;  . BIOPSY  03/30/2020   Procedure: BIOPSY;  Surgeon: Ronald Lobo, MD;  [facility];  Service: Endoscopy;;  . BIOPSY  05/02/2020   Procedure: BIOPSY;  Surgeon: Harvel Quale, MD;  [facility];  Service: Gastroenterology;;  . CARDIAC CATHETERIZATION     X 2, last one in 1998  . CHOLECYSTECTOMY N/A 04/13/2017   Procedure: LAPAROSCOPIC CHOLECYSTECTOMY;  Surgeon: Aviva Signs, MD;  Location: [facility];  Service: General;  Laterality: N/A;  . COLONOSCOPY    . COLONOSCOPY  May 2012   Dr. Olevia Perches: mild diverticulosis, otherwise normal.   . COLONOSCOPY WITH PROPOFOL N/A 05/02/2020   Dr. Jenetta Downer: 8 mm polyp removed from the ascending colon, 2 mm polyp removed from the ascending colon.  Tubular adenomas.  Diverticulosis.  Mucosal ulceration in the sigmoid colon noted, pathology consistent with ischemic  colitis.  Marland Kitchen ESOPHAGOGASTRODUODENOSCOPY N/A 01/28/2015   Dr. Gala Romney: reflux esophagitis, Schatzki's ring not manipulated due to recent bleeding  . ESOPHAGOGASTRODUODENOSCOPY N/A 03/30/2015   Dr. Gala Romney: Schatzki's ring s/p Venia Minks dilation, previously noted esophageal ulcer completely healed  . ESOPHAGOGASTRODUODENOSCOPY N/A 03/30/2020   Buccini: Moderately severe erosive, circumferential, confluent esophagitis with no bleeding found 25 to 40 cm from incisors.  Nonobstructing and mild Schatzki ring, there were also multiple distal esophageal rings noted, minimal hiatal hernia.  Marland Kitchen  ESOPHAGOGASTRODUODENOSCOPY (EGD) WITH PROPOFOL N/A 08/21/2020   Procedure: ESOPHAGOGASTRODUODENOSCOPY (EGD) WITH PROPOFOL;  Surgeon: Eloise Harman, DO;  [facility];  Service: Endoscopy;  Laterality: N/A;  2:15pm  . IR IMAGING GUIDED PORT INSERTION  03/13/2020  . LYMPH NODE BIOPSY Left 03/20/2020   Procedure: LEFT POSTERIOR CERVICAL LYMPH NODE BIOPSY;  Surgeon: Georganna Skeans, MD;  Location: [place];  Service: General;  Laterality: Left;  Marland Kitchen MALONEY DILATION N/A 03/30/2015   Procedure: Venia Minks DILATION;  Surgeon: Daneil Dolin, MD;  [facility];  Service: Endoscopy;  Laterality: N/A;  . POLYPECTOMY  05/02/2020   Procedure: POLYPECTOMY;  Surgeon: Harvel Quale, MD;  [facility];  Service: Gastroenterology;;    Family History  Problem Relation Age of Onset  . Heart disease Mother   . Osteoarthritis Mother   . Sudden death Father   . Single kidney Father   . Other Father        h/o severe MVA injuries  . Hyperlipidemia Sister   . Other Daughter        Myalgias  . Fibromyalgia Daughter   . Allergies Daughter   . Heart disease Maternal Grandfather   . Sudden death Paternal Grandmother   . Diabetes Paternal Grandfather   . Heart disease Daughter   . Other Daughter        palpitations  . Pulmonary fibrosis Maternal Aunt   . Cancer Paternal Uncle   . Pulmonary fibrosis Maternal Aunt   . Colon cancer Neg Hx     Social History   Socioeconomic History  . Marital status: Widowed    Spouse name: Not on file  . Number of children: 2  . Years of education: Not on file  . Highest education level: Not on file  Occupational History  . Occupation: retired  Tobacco Use  . Smoking status: Never Smoker  . Smokeless tobacco: Never Used  Vaping Use  . Vaping Use: Never used  Substance and Sexual Activity  . Alcohol use: No  . Drug use: No  . Sexual activity: Never  Other Topics Concern  . Not on file  Social History Narrative   Ms.  Elizabeth Mcintyre is widowed. Her young grandson lives with her, for whom she shares custody with her daughter, the son's aunt.     Social Determinants of Health   Financial Resource Strain: Not on file  Food Insecurity: Not on file  Transportation Needs: Not on file  Physical Activity: Not on file  Stress: Not on file  Social Connections: Not on file  Intimate Partner Violence: Not on file    ROS Review of Systems  Constitutional: Negative for chills and fever.  HENT: Negative for congestion, sinus pressure, sinus pain and sore throat.   Eyes: Positive for visual disturbance. Negative for pain and discharge.  Respiratory: Negative for cough and shortness of breath.   Cardiovascular: Negative for chest pain and palpitations.  Gastrointestinal: Negative for abdominal pain, constipation, diarrhea, nausea and vomiting.  Endocrine: Negative for polydipsia and polyuria.  Genitourinary: Negative for dysuria and hematuria.  Musculoskeletal: Negative for neck pain and neck stiffness.  Skin: Negative for rash.  Neurological: Positive for headaches. Negative for dizziness and weakness.  Psychiatric/Behavioral: Negative for agitation and behavioral problems.    Objective:   Today's Vitals: BP (!) 163/78 (BP Location: Left Arm, Patient Position: Sitting, Cuff Size: Normal)   Pulse 75   Temp 98.1 F (36.7 C) (Oral)   Resp 18   Ht 5' 2.5" (1.588 m)   Wt 132 lb 1.9 oz (59.9 kg)   SpO2 100%   BMI 23.78 kg/m   Physical Exam Vitals reviewed.  Constitutional:      General: She is not in acute distress.    Appearance: She is not diaphoretic.  HENT:     Head: Normocephalic and atraumatic.     Nose: Nose normal.     Mouth/Throat:     Mouth: Mucous membranes are moist.  Eyes:     General: No scleral icterus.    Extraocular Movements: Extraocular movements intact.     Pupils: Pupils are equal, round, and reactive to light.  Cardiovascular:     Rate and Rhythm: Normal rate and regular rhythm.      Pulses: Normal pulses.     Heart sounds: Normal heart sounds. No murmur heard.   Pulmonary:     Breath sounds: Normal breath sounds. No wheezing or rales.  Musculoskeletal:     Cervical back: Neck supple. No tenderness.     Right lower leg: No edema.     Left lower leg: No edema.  Skin:    General: Skin is warm.     Findings: No rash.  Neurological:     General: No focal deficit present.     Mental Status: She is alert and oriented to person, place, and time.  Psychiatric:        Mood and Affect: Mood normal.        Behavior: Behavior normal.     Assessment & Plan:   Problem List Items Addressed This Visit      Cardiovascular and Mediastinum   Essential hypertension, benign    BP Readings from Last 1 Encounters:  01/02/21 (!) 163/78   Uncontrolled with Amlodipine 10 mg QD due to noncompliance Counseled for compliance with the medications Advised DASH diet and moderate exercise/walking, at least 150 mins/week         Respiratory   Mild intermittent asthma    Well-controlled with PRN Albuterol        Endocrine   Acquired hypothyroidism    Lab Results  Component Value Date   TSH 5.70 (H) 11/13/2020   On Levothyroxine 88 mcg QD Will check TSH and free T4 in the next visit        Genitourinary   Stage 3a chronic kidney disease ([place])    Last CMP reviewed Stable Avoid nephrotoxic agents        Other   Marginal zone lymphoma ([place])    Follows up with Oncology On Rituximab      Prediabetes    Lab Results  Component Value Date   HGBA1C 4.9 11/13/2020   Continue to follow low carb diet      Mixed hyperlipidemia   Encounter to establish care - Primary    Care established History and medications reviewed with the patient      Restless legs syndrome    On Ropinirole  Other Visit Diagnoses    Diabetic eye exam [facility])       Relevant Orders   Ambulatory referral to Ophthalmology      Outpatient Encounter Medications as of 01/02/2021   Medication Sig  . acetaminophen (TYLENOL) 325 MG tablet Take 325 mg by mouth every 6 (six) hours as needed for moderate pain.   Marland Kitchen albuterol (VENTOLIN HFA) 108 (90 Base) MCG/ACT inhaler Inhale 2 puffs into the lungs every 6 (six) hours as needed for wheezing.  Marland Kitchen amLODipine (NORVASC) 10 MG tablet Take 1 tablet (10 mg total) by mouth daily.  . cetirizine (ZYRTEC) 10 MG tablet Take 10 mg by mouth at bedtime. As needed  . Cholecalciferol (VITAMIN D3) 25 MCG (1000 UT) CAPS Take 1,000 Units by mouth daily.   Marland Kitchen levothyroxine (SYNTHROID) 88 MCG tablet Take 1 tablet (88 mcg total) by mouth daily.  Marland Kitchen lidocaine-prilocaine (EMLA) cream Apply 1 application topically as needed. (Patient taking differently: Apply 1 application topically as needed (port access).)  . pantoprazole (PROTONIX) 40 MG tablet Take 1 tablet (40 mg total) by mouth daily.  . potassium chloride SA (KLOR-CON) 20 MEQ tablet Take 1 tablet (20 mEq total) by mouth daily.  Marland Kitchen rOPINIRole (REQUIP) 0.25 MG tablet Take 1 tablet (0.25 mg total) by mouth at bedtime.  . rosuvastatin (CRESTOR) 20 MG tablet Take 1 tablet (20 mg total) by mouth daily.  . sucralfate (CARAFATE) 1 g tablet Take 1 g by mouth 4 (four) times daily -  with meals and at bedtime. As needed  . vitamin B-12 (CYANOCOBALAMIN) 1000 MCG tablet Take 1,000 mcg by mouth daily.   . [DISCONTINUED] allopurinol (ZYLOPRIM) 300 MG tablet Take 1 tablet (300 mg total) by mouth daily. (Patient not taking: Reported on 01/02/2021)  . [DISCONTINUED] escitalopram (LEXAPRO) 10 MG tablet Take 1 tablet (10 mg total) by mouth daily. (Patient not taking: No sig reported)   No facility-administered encounter medications on file as of 01/02/2021.    Follow-up: Return in about 4 weeks (around 01/30/2021) for HTN.   Lindell Spar, MD

## 2021-01-02 NOTE — Assessment & Plan Note (Signed)
Lab Results  Component Value Date   TSH 5.70 (H) 11/13/2020   On Levothyroxine 88 mcg QD Will check TSH and free T4 in the next visit

## 2021-01-02 NOTE — Assessment & Plan Note (Signed)
On Ropinirole 

## 2021-01-02 NOTE — Assessment & Plan Note (Signed)
Last CMP reviewed Stable Avoid nephrotoxic agents

## 2021-01-02 NOTE — Assessment & Plan Note (Signed)
Lab Results  Component Value Date   HGBA1C 4.9 11/13/2020   Continue to follow low carb diet 

## 2021-01-02 NOTE — Assessment & Plan Note (Signed)
Well-controlled with PRN Albuterol

## 2021-01-02 NOTE — Patient Instructions (Addendum)
Please continue taking your medications regularly.  Please follow low salt diet and perform moderate exercise/walking as tolerated.  Please check BP at home regularly and bring the log in the next visit.  Please continue to take other medications as prescribed.

## 2021-01-02 NOTE — Assessment & Plan Note (Signed)
BP Readings from Last 1 Encounters:  01/02/21 (!) 163/78   Uncontrolled with Amlodipine 10 mg QD due to noncompliance Counseled for compliance with the medications Advised DASH diet and moderate exercise/walking, at least 150 mins/week

## 2021-01-02 NOTE — Assessment & Plan Note (Addendum)
Follows up with Oncology On Rituximab

## 2021-01-02 NOTE — Assessment & Plan Note (Signed)
Care established History and medications reviewed with the patient 

## 2021-01-03 ENCOUNTER — Ambulatory Visit: Payer: Medicare Other | Admitting: Hematology and Oncology

## 2021-01-03 ENCOUNTER — Ambulatory Visit: Payer: Medicare Other

## 2021-01-03 ENCOUNTER — Other Ambulatory Visit: Payer: Medicare Other

## 2021-01-04 NOTE — Assessment & Plan Note (Signed)
On Crestor 

## 2021-01-09 ENCOUNTER — Other Ambulatory Visit: Payer: Self-pay | Admitting: Physician Assistant

## 2021-01-09 ENCOUNTER — Telehealth: Payer: Self-pay | Admitting: Hematology and Oncology

## 2021-01-09 DIAGNOSIS — C8580 Other specified types of non-Hodgkin lymphoma, unspecified site: Secondary | ICD-10-CM

## 2021-01-09 NOTE — Telephone Encounter (Signed)
Called and left msg about change in provider for 4/28 appt due to Provider family emergency.

## 2021-01-10 ENCOUNTER — Inpatient Hospital Stay: Payer: Medicare Other

## 2021-01-10 ENCOUNTER — Inpatient Hospital Stay: Payer: Medicare Other | Admitting: Physician Assistant

## 2021-01-10 ENCOUNTER — Inpatient Hospital Stay: Payer: Medicare Other | Attending: Hematology and Oncology

## 2021-01-10 ENCOUNTER — Other Ambulatory Visit: Payer: Medicare Other

## 2021-01-10 ENCOUNTER — Inpatient Hospital Stay (HOSPITAL_BASED_OUTPATIENT_CLINIC_OR_DEPARTMENT_OTHER): Payer: Medicare Other

## 2021-01-10 ENCOUNTER — Ambulatory Visit: Payer: Medicare Other | Admitting: Hematology and Oncology

## 2021-01-10 ENCOUNTER — Other Ambulatory Visit: Payer: Self-pay

## 2021-01-10 VITALS — BP 168/73 | HR 93 | Temp 97.7°F | Resp 18

## 2021-01-10 VITALS — BP 148/66 | HR 76 | Temp 97.7°F | Resp 18 | Ht 62.5 in | Wt 133.8 lb

## 2021-01-10 DIAGNOSIS — C8580 Other specified types of non-Hodgkin lymphoma, unspecified site: Secondary | ICD-10-CM

## 2021-01-10 DIAGNOSIS — Z5112 Encounter for antineoplastic immunotherapy: Secondary | ICD-10-CM | POA: Diagnosis not present

## 2021-01-10 DIAGNOSIS — C8514 Unspecified B-cell lymphoma, lymph nodes of axilla and upper limb: Secondary | ICD-10-CM | POA: Diagnosis not present

## 2021-01-10 DIAGNOSIS — Z23 Encounter for immunization: Secondary | ICD-10-CM

## 2021-01-10 DIAGNOSIS — Z95828 Presence of other vascular implants and grafts: Secondary | ICD-10-CM

## 2021-01-10 DIAGNOSIS — Z452 Encounter for adjustment and management of vascular access device: Secondary | ICD-10-CM | POA: Diagnosis not present

## 2021-01-10 LAB — CMP (CANCER CENTER ONLY)
ALT: 11 U/L (ref 0–44)
AST: 17 U/L (ref 15–41)
Albumin: 4.6 g/dL (ref 3.5–5.0)
Alkaline Phosphatase: 81 U/L (ref 38–126)
Anion gap: 13 (ref 5–15)
BUN: 16 mg/dL (ref 8–23)
CO2: 24 mmol/L (ref 22–32)
Calcium: 9.5 mg/dL (ref 8.9–10.3)
Chloride: 105 mmol/L (ref 98–111)
Creatinine: 1.27 mg/dL — ABNORMAL HIGH (ref 0.44–1.00)
GFR, Estimated: 43 mL/min — ABNORMAL LOW (ref >60)
Glucose, Bld: 104 mg/dL — ABNORMAL HIGH (ref 70–99)
Potassium: 3.6 mmol/L (ref 3.5–5.1)
Sodium: 142 mmol/L (ref 135–145)
Total Bilirubin: 0.4 mg/dL (ref 0.3–1.2)
Total Protein: 7.1 g/dL (ref 6.5–8.1)

## 2021-01-10 LAB — CBC WITH DIFFERENTIAL (CANCER CENTER ONLY)
Abs Immature Granulocytes: 0.02 10*3/uL (ref 0.00–0.07)
Basophils Absolute: 0.1 10*3/uL (ref 0.0–0.1)
Basophils Relative: 1 %
Eosinophils Absolute: 0.4 10*3/uL (ref 0.0–0.5)
Eosinophils Relative: 7 %
HCT: 40.4 % (ref 36.0–46.0)
Hemoglobin: 14 g/dL (ref 12.0–15.0)
Immature Granulocytes: 0 %
Lymphocytes Relative: 28 %
Lymphs Abs: 1.6 10*3/uL (ref 0.7–4.0)
MCH: 30 pg (ref 26.0–34.0)
MCHC: 34.7 g/dL (ref 30.0–36.0)
MCV: 86.5 fL (ref 80.0–100.0)
Monocytes Absolute: 0.4 10*3/uL (ref 0.1–1.0)
Monocytes Relative: 7 %
Neutro Abs: 3.2 10*3/uL (ref 1.7–7.7)
Neutrophils Relative %: 57 %
Platelet Count: 171 10*3/uL (ref 150–400)
RBC: 4.67 MIL/uL (ref 3.87–5.11)
RDW: 12.9 % (ref 11.5–15.5)
WBC Count: 5.6 10*3/uL (ref 4.0–10.5)
nRBC: 0 % (ref 0.0–0.2)

## 2021-01-10 LAB — LACTATE DEHYDROGENASE: LDH: 205 U/L — ABNORMAL HIGH (ref 98–192)

## 2021-01-10 MED ORDER — SODIUM CHLORIDE 0.9% FLUSH
10.0000 mL | INTRAVENOUS | Status: DC | PRN
  Administered 2021-01-10: 10 mL via INTRAVENOUS
  Filled 2021-01-10: qty 10

## 2021-01-10 MED ORDER — SODIUM CHLORIDE 0.9 % IV SOLN
375.0000 mg/m2 | Freq: Once | INTRAVENOUS | Status: AC
  Administered 2021-01-10: 600 mg via INTRAVENOUS
  Filled 2021-01-10: qty 50

## 2021-01-10 MED ORDER — DIPHENHYDRAMINE HCL 25 MG PO CAPS
50.0000 mg | ORAL_CAPSULE | Freq: Once | ORAL | Status: AC
  Administered 2021-01-10: 50 mg via ORAL

## 2021-01-10 MED ORDER — ACETAMINOPHEN 325 MG PO TABS
ORAL_TABLET | ORAL | Status: AC
  Filled 2021-01-10: qty 2

## 2021-01-10 MED ORDER — FAMOTIDINE IN NACL 20-0.9 MG/50ML-% IV SOLN
INTRAVENOUS | Status: AC
  Filled 2021-01-10: qty 50

## 2021-01-10 MED ORDER — FAMOTIDINE IN NACL 20-0.9 MG/50ML-% IV SOLN
20.0000 mg | Freq: Once | INTRAVENOUS | Status: AC
  Administered 2021-01-10: 20 mg via INTRAVENOUS

## 2021-01-10 MED ORDER — SODIUM CHLORIDE 0.9% FLUSH
10.0000 mL | INTRAVENOUS | Status: DC | PRN
  Administered 2021-01-10: 10 mL
  Filled 2021-01-10: qty 10

## 2021-01-10 MED ORDER — DIPHENHYDRAMINE HCL 25 MG PO CAPS
ORAL_CAPSULE | ORAL | Status: AC
  Filled 2021-01-10: qty 2

## 2021-01-10 MED ORDER — ACETAMINOPHEN 325 MG PO TABS
650.0000 mg | ORAL_TABLET | Freq: Once | ORAL | Status: AC
  Administered 2021-01-10: 650 mg via ORAL

## 2021-01-10 MED ORDER — ALTEPLASE 2 MG IJ SOLR
2.0000 mg | Freq: Once | INTRAMUSCULAR | Status: AC | PRN
  Administered 2021-01-10: 2 mg
  Filled 2021-01-10: qty 2

## 2021-01-10 MED ORDER — HEPARIN SOD (PORK) LOCK FLUSH 100 UNIT/ML IV SOLN
500.0000 [IU] | Freq: Once | INTRAVENOUS | Status: AC | PRN
  Administered 2021-01-10: 500 [IU]
  Filled 2021-01-10: qty 5

## 2021-01-10 MED ORDER — ALTEPLASE 2 MG IJ SOLR
INTRAMUSCULAR | Status: AC
  Filled 2021-01-10: qty 2

## 2021-01-10 MED ORDER — SODIUM CHLORIDE 0.9 % IV SOLN
Freq: Once | INTRAVENOUS | Status: AC
Start: 2021-01-10 — End: 2021-01-10
  Filled 2021-01-10: qty 250

## 2021-01-10 NOTE — Progress Notes (Signed)
I was unable to get blood return from pt's port. Pt was given Cathflo at 1025 by Threasa Beards, RN. Pt was sent to lab for peripheral lab draw. Dr was notified.

## 2021-01-10 NOTE — Patient Instructions (Signed)
[facility]  Discharge Instructions: Thank you for choosing [facility] to provide your oncology and hematology care.   If you have a lab appointment with the Cancer Center, please go directly to the Cancer Center and check in at the registration area.   Wear comfortable clothing and clothing appropriate for easy access to any Portacath or PICC line.   We strive to give you quality time with your provider. You may need to reschedule your appointment if you arrive late (15 or more minutes).  Arriving late affects you and other patients whose appointments are after yours.  Also, if you miss three or more appointments without notifying the office, you may be dismissed from the clinic at the provider's discretion.      For prescription refill requests, have your pharmacy contact our office and allow 72 hours for refills to be completed.    Today you received the following chemotherapy and/or immunotherapy agents rituxan      To help prevent nausea and vomiting after your treatment, we encourage you to take your nausea medication as directed.  BELOW ARE SYMPTOMS THAT SHOULD BE REPORTED IMMEDIATELY: *FEVER GREATER THAN 100.4 F (38 C) OR HIGHER *CHILLS OR SWEATING *NAUSEA AND VOMITING THAT IS NOT CONTROLLED WITH YOUR NAUSEA MEDICATION *UNUSUAL SHORTNESS OF BREATH *UNUSUAL BRUISING OR BLEEDING *URINARY PROBLEMS (pain or burning when urinating, or frequent urination) *BOWEL PROBLEMS (unusual diarrhea, constipation, pain near the anus) TENDERNESS IN MOUTH AND THROAT WITH OR WITHOUT PRESENCE OF ULCERS (sore throat, sores in mouth, or a toothache) UNUSUAL RASH, SWELLING OR PAIN  UNUSUAL VAGINAL DISCHARGE OR ITCHING   Items with * indicate a potential emergency and should be followed up as soon as possible or go to the Emergency Department if any problems should occur.  Please show the CHEMOTHERAPY ALERT CARD or IMMUNOTHERAPY ALERT CARD at check-in to the  Emergency Department and triage nurse.  Should you have questions after your visit or need to cancel or reschedule your appointment, please contact [facility]  Dept: [phone]  and follow the prompts.  Office hours are 8:00 a.m. to 4:30 p.m. Monday - Friday. Please note that voicemails left after 4:00 p.m. may not be returned until the following business day.  We are closed weekends and major holidays. You have access to a nurse at all times for urgent questions. Please call the main number to the clinic Dept: [phone] and follow the prompts.   For any non-urgent questions, you may also contact your provider using MyChart. We now offer e-Visits for anyone 18 and older to request care online for non-urgent symptoms. For details visit [email or web address].   Also download the MyChart app! Go to the app store, search "MyChart", open the app, select [facility], and log in with your MyChart username and password.  Due to Covid, a mask is required upon entering the hospital/clinic. If you do not have a mask, one will be given to you upon arrival. For doctor visits, patients may have 1 support person aged 18 or older with them. For treatment visits, patients cannot have anyone with them due to current Covid guidelines and our immunocompromised population.   

## 2021-01-10 NOTE — Patient Instructions (Signed)
Implanted Port Insertion, Care After This sheet gives you information about how to care for yourself after your procedure. Your health care provider may also give you more specific instructions. If you have problems or questions, contact your health care provider. What can I expect after the procedure? After the procedure, it is common to have:  Discomfort at the port insertion site.  Bruising on the skin over the port. This should improve over 3-4 days. Follow these instructions at home: Port care  After your port is placed, you will get a manufacturer's information card. The card has information about your port. Keep this card with you at all times.  Take care of the port as told by your health care provider. Ask your health care provider if you or a family member can get training for taking care of the port at home. A home health care nurse may also take care of the port.  Make sure to remember what type of port you have. Incision care  Follow instructions from your health care provider about how to take care of your port insertion site. Make sure you: ? Wash your hands with soap and water before and after you change your bandage (dressing). If soap and water are not available, use hand sanitizer. ? Change your dressing as told by your health care provider. ? Leave stitches (sutures), skin glue, or adhesive strips in place. These skin closures may need to stay in place for 2 weeks or longer. If adhesive strip edges start to loosen and curl up, you may trim the loose edges. Do not remove adhesive strips completely unless your health care provider tells you to do that.  Check your port insertion site every day for signs of infection. Check for: ? Redness, swelling, or pain. ? Fluid or blood. ? Warmth. ? Pus or a bad smell.      Activity  Return to your normal activities as told by your health care provider. Ask your health care provider what activities are safe for you.  Do not  lift anything that is heavier than 10 lb (4.5 kg), or the limit that you are told, until your health care provider says that it is safe. General instructions  Take over-the-counter and prescription medicines only as told by your health care provider.  Do not take baths, swim, or use a hot tub until your health care provider approves. Ask your health care provider if you may take showers. You may only be allowed to take sponge baths.  Do not drive for 24 hours if you were given a sedative during your procedure.  Wear a medical alert bracelet in case of an emergency. This will tell any health care providers that you have a port.  Keep all follow-up visits as told by your health care provider. This is important. Contact a health care provider if:  You cannot flush your port with saline as directed, or you cannot draw blood from the port.  You have a fever or chills.  You have redness, swelling, or pain around your port insertion site.  You have fluid or blood coming from your port insertion site.  Your port insertion site feels warm to the touch.  You have pus or a bad smell coming from the port insertion site. Get help right away if:  You have chest pain or shortness of breath.  You have bleeding from your port that you cannot control. Summary  Take care of the port as told by your   health care provider. Keep the manufacturer's information card with you at all times.  Change your dressing as told by your health care provider.  Contact a health care provider if you have a fever or chills or if you have redness, swelling, or pain around your port insertion site.  Keep all follow-up visits as told by your health care provider. This information is not intended to replace advice given to you by your health care provider. Make sure you discuss any questions you have with your health care provider. Document Revised: 03/30/2018 Document Reviewed: 03/30/2018 [employer] Patient Education   2021 [employer]  

## 2021-01-10 NOTE — Progress Notes (Signed)
[place] [phone]   [phone]  PROGRESS NOTE  Patient Care Team: Ma Hillock, DO as PCP - General (Family Medicine) Danie Binder, MD (Inactive) as Consulting Physician (Gastroenterology) Minus Breeding, MD as Consulting Physician (Cardiology) Annitta Needs, NP (Gastroenterology) Carlis Stable, NP as Nurse Practitioner (Gastroenterology) Montez Morita, Quillian Quince, MD as Consulting Physician (Gastroenterology) Eloise Harman, DO as Consulting Physician (Internal Medicine)  Hematological/Oncological History  # Low Grade Marginal Zone Lymphoma, Stage III 1) 06/06/2019: patient underwent a diagnostic mammogram of the left breast. Calcifications noted in the left breast, recommended 6 month f/u imaging. 2) 02/02/2020: bilateral diagnostic mammogram showedabnormal enlarged lymph nodes with cortical thickening. The largest of these measures 2.2 x 1.7 x 2.0 cm. 3) 02/02/2020: US guided biopsy of left axillary lymph nodes performed. 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 4) 02/16/2020: establish care with Dr. Lorenso Courier 5) 02/29/2020: PET CT scan performed, showed hypermetabolic lymph nodes identified in the posterior left neck, both axillary/subpectoral regions, mediastinum, hila, right external iliac chain, and right groin. Massive splenomegaly of 17.8 cm noted as well.  6) 03/20/2020: excisional biopsy of left posterior cervical lymph node. Biopsy confirmed most likely low grade marginal zone lymphoma.   7) 7/14-7/15/2021: Cycle 1 Day 1 of Rituximab/Bendamycin 8) 03/30/2020-04/01/2020: admitted inpatient for hematemesis. Noted to have marked esophagitis on EGD.  9) 04/25/2020: IV feraheme 510mg  administered 10) 04/26/2020: Cycle 2 Day 1 of  Rituximab/Bendamycin 11) 05/10/2020: HOLD R-Benda in setting of colitis, GI bleed, and intolerance to therapy. Plan to start monotherapy Rituximab once patient has rebounded.  12) 07/06/2020: Cycle 1 Week 1 of monotherapy rituximab 13) 07/30/2020: Cycle 1 Week 3 of monotherapy rituximab 14) 08/06/2020: Completed Cycle 1 of monotherapy rituximab 15)  09/12/2020: PET CT scan shows completed resolution of tumor and FDG avid lesions. Deauville Score 1. Complete response.  16) 11/16/2020: Cycle 1 Day 1 of Maintenance Rituximab  17) 01/10/2021:  Cycle 2 Day 1 of Maintenance Rituximab   Interval History:  Elizabeth Mcintyre 80 y.o. female with medical history significant for low grade marginal zone lymphoma stage IIIA who presents for a follow up visit prior Cycle 2 Day 1 of maintenance Rituximab.   On exam today, Mrs. Traore reports that she has been well in the interim since her last visit. Her energy and appetite are overall stable.  She continues to complete her ADLs on her own .she denies any nausea, vomiting or abdominal pain. She has diarrhea for the past one week that she suspects is to food intolerance. She reports up to two episodes per day and she hasn't taken any antidiarrheals. Patient reports chronic knee pain, mainly with walking. She denies any fevers, chills, night sweats, shortness of breath, chest pain, cough, neuropathy or skin changes. The rest of the 10 point ROS is negative.  MEDICAL HISTORY:  Past Medical History:  Diagnosis Date  . Asthma   . Bloating 04/26/2019  . 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 Cancer ([place]) 2021   Lymphoma  . 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 DIABETES MELLITUS, TYPE II 11/09/2007   diet control  . Diverticulosis 03/2011  . Fecal incontinence 07/07/2019  . GERD (gastroesophageal reflux disease)    none recently  . GI bleed   . Gout 06/09/2014  R great toe, ball of foot Stopped HCTZ several mos ago,  On losartan        . Hematemesis 03/30/2020  . History of rheumatoid arthritis    during 30's, was treated.  . Hyperlipidemia   . Hypertension   . IBS (irritable bowel syndrome)   . 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 Lower GI bleeding 04/30/2020  . Lymphadenopathy 01/23/2020  . Osteopenia 2017   Last  bone density 05/04/2017: -2.4  . PONV (postoperative nausea and vomiting)   . Schatzki's ring   . Stress incontinence   . Stroke [facility])    mini stroke - found on a CT scan    SURGICAL HISTORY: Past Surgical History:  Procedure Laterality Date  . ABDOMINAL HYSTERECTOMY    . BALLOON DILATION N/A 08/21/2020   Procedure: BALLOON DILATION;  Surgeon: Eloise Harman, DO;  [facility];  Service: Endoscopy;  Laterality: N/A;  . BIOPSY  03/30/2020   Procedure: BIOPSY;  Surgeon: Ronald Lobo, MD;  Location: [facility];  Service: Endoscopy;;  . BIOPSY  05/02/2020   Procedure: BIOPSY;  Surgeon: Harvel Quale, MD;  Location: [facility];  Service: Gastroenterology;;  . CARDIAC CATHETERIZATION     X 2, last one in 1998  . CHOLECYSTECTOMY N/A 04/13/2017   Procedure: LAPAROSCOPIC CHOLECYSTECTOMY;  Surgeon: Aviva Signs, MD;  Location: [facility];  Service: General;  Laterality: N/A;  . COLONOSCOPY    . COLONOSCOPY  May 2012   Dr. Olevia Perches: mild diverticulosis, otherwise normal.   . COLONOSCOPY WITH PROPOFOL N/A 05/02/2020   Dr. Jenetta Downer: 8 mm polyp removed from the ascending colon, 2 mm polyp removed from the ascending colon.  Tubular adenomas.  Diverticulosis.  Mucosal ulceration in the sigmoid colon noted, pathology consistent with ischemic colitis.  Marland Kitchen ESOPHAGOGASTRODUODENOSCOPY N/A 01/28/2015   Dr. Gala Romney: reflux esophagitis, Schatzki's ring not manipulated due to recent bleeding  . ESOPHAGOGASTRODUODENOSCOPY N/A 03/30/2015   Dr. Gala Romney: Schatzki's ring s/p Venia Minks dilation, previously noted esophageal ulcer completely healed  . ESOPHAGOGASTRODUODENOSCOPY N/A 03/30/2020    Buccini: Moderately severe erosive, circumferential, confluent esophagitis with no bleeding found 25 to 40 cm from incisors.  Nonobstructing and mild Schatzki ring, there were also multiple distal esophageal rings noted, minimal hiatal hernia.  Marland Kitchen ESOPHAGOGASTRODUODENOSCOPY (EGD) WITH PROPOFOL N/A 08/21/2020   Procedure: ESOPHAGOGASTRODUODENOSCOPY (EGD) WITH PROPOFOL;  Surgeon: Eloise Harman, DO;  [facility];  Service: Endoscopy;  Laterality: N/A;  2:15pm  . IR IMAGING GUIDED PORT INSERTION  03/13/2020  . LYMPH NODE BIOPSY Left 03/20/2020   Procedure: LEFT POSTERIOR CERVICAL LYMPH NODE BIOPSY;  Surgeon: Georganna Skeans, MD;  Location: [facility] [place];  Service: General;  Laterality: Left;  Marland Kitchen MALONEY DILATION N/A 03/30/2015   Procedure: Venia Minks DILATION;  Surgeon: Daneil Dolin, MD;  [facility];  Service: Endoscopy;  Laterality: N/A;  . POLYPECTOMY  05/02/2020   Procedure: POLYPECTOMY;  Surgeon: Harvel Quale, MD;  [facility];  Service: Gastroenterology;;    SOCIAL HISTORY: Social History   Socioeconomic History  . Marital status: Widowed    Spouse name: Not on file  . Number of children: 2  . Years of education: Not on file  . Highest education level: Not on file  Occupational History  . Occupation: retired  Tobacco Use  . Smoking status: Never Smoker  . Smokeless tobacco: Never Used  Vaping Use  . Vaping Use: Never used  Substance and Sexual Activity  .  Alcohol use: No  . Drug use: No  . Sexual activity: Never  Other Topics Concern  . Not on file  Social History Narrative   Ms. Coltharp is widowed. Her young grandson lives with her, for whom she shares custody with her daughter, the son's aunt.     Social Determinants of Health   Financial Resource Strain: Not on file  Food Insecurity: Not on file  Transportation Needs: Not on file  Physical Activity: Not on file  Stress: Not on file  Social Connections: Not on file  Intimate  Partner Violence: Not on file    FAMILY HISTORY: Family History  Problem Relation Age of Onset  . Heart disease Mother   . Osteoarthritis Mother   . Sudden death Father   . Single kidney Father   . Other Father        h/o severe MVA injuries  . Hyperlipidemia Sister   . Other Daughter        Myalgias  . Fibromyalgia Daughter   . Allergies Daughter   . Heart disease Maternal Grandfather   . Sudden death Paternal Grandmother   . Diabetes Paternal Grandfather   . Heart disease Daughter   . Other Daughter        palpitations  . Pulmonary fibrosis Maternal Aunt   . Cancer Paternal Uncle   . Pulmonary fibrosis Maternal Aunt   . Colon cancer Neg Hx     ALLERGIES:  is allergic to codeine phosphate.  MEDICATIONS:  Current Outpatient Medications  Medication Sig Dispense Refill  . acetaminophen (TYLENOL) 325 MG tablet Take 325 mg by mouth every 6 (six) hours as needed for moderate pain.     Marland Kitchen albuterol (VENTOLIN HFA) 108 (90 Base) MCG/ACT inhaler Inhale 2 puffs into the lungs every 6 (six) hours as needed for wheezing. 8.5 g 2  . amLODipine (NORVASC) 10 MG tablet Take 1 tablet (10 mg total) by mouth daily. 90 tablet 1  . cetirizine (ZYRTEC) 10 MG tablet Take 10 mg by mouth at bedtime. As needed    . Cholecalciferol (VITAMIN D3) 25 MCG (1000 UT) CAPS Take 1,000 Units by mouth daily.     Marland Kitchen levothyroxine (SYNTHROID) 88 MCG tablet Take 1 tablet (88 mcg total) by mouth daily. 90 tablet 4  . lidocaine-prilocaine (EMLA) cream Apply 1 application topically as needed. (Patient taking differently: Apply 1 application topically as needed (port access).) 30 g 1  . pantoprazole (PROTONIX) 40 MG tablet Take 1 tablet (40 mg total) by mouth daily. 90 tablet 3  . potassium chloride SA (KLOR-CON) 20 MEQ tablet Take 1 tablet (20 mEq total) by mouth daily. 21 tablet 0  . rOPINIRole (REQUIP) 0.25 MG tablet Take 1 tablet (0.25 mg total) by mouth at bedtime. 90 tablet 1  . rosuvastatin (CRESTOR) 20 MG  tablet Take 1 tablet (20 mg total) by mouth daily. 90 tablet 3  . sucralfate (CARAFATE) 1 g tablet Take 1 g by mouth 4 (four) times daily -  with meals and at bedtime. As needed    . vitamin B-12 (CYANOCOBALAMIN) 1000 MCG tablet Take 1,000 mcg by mouth daily.      No current facility-administered medications for this visit.    REVIEW OF SYSTEMS:   Constitutional: ( - ) fevers, ( - )  chills , ( - ) night sweats Eyes: ( - ) blurriness of vision, ( - ) double vision, ( - ) watery eyes Ears, nose, mouth, throat, and face: ( - )  mucositis, ( - ) sore throat Respiratory: ( - ) cough, ( - ) dyspnea, ( - ) wheezes Cardiovascular: ( - ) palpitation, ( - ) chest discomfort, ( - ) lower extremity swelling Gastrointestinal:  ( - ) nausea, ( - ) heartburn, ( - ) change in bowel habits Skin: ( - ) abnormal skin rashes Lymphatics: ( - ) new lymphadenopathy, ( - ) easy bruising Neurological: ( - ) numbness, ( - ) tingling, ( - ) new weaknesses Behavioral/Psych: ( - ) mood change, ( - ) new changes  All other systems were reviewed with the patient and are negative.  PHYSICAL EXAMINATION: ECOG PERFORMANCE STATUS: 1 - Symptomatic but completely ambulatory  Vitals:   01/10/21 1053  BP: (!) 148/66  Pulse: 76  Resp: 18  Temp: 97.7 F (36.5 C)  SpO2: 100%   Filed Weights   01/10/21 1053  Weight: 133 lb 12.8 oz (60.7 kg)    GENERAL: well appearing elderly Caucasian female in NAD  SKIN: skin color, texture, turgor are normal. EYES: conjunctiva are pink and non-injected, sclera clear NECK: supple, non-tender.  LUNGS: clear to auscultation and percussion with normal breathing effort HEART: regular rate & rhythm and no murmurs and no lower extremity edema Musculoskeletal: no cyanosis of digits and no clubbing  PSYCH: alert & oriented x 3, fluent speech NEURO: no focal motor/sensory deficits  LABORATORY DATA:  I have reviewed the data as listed CBC Latest Ref Rng & Units 01/10/2021 11/16/2020  10/25/2020  WBC 4.0 - 10.5 K/uL 5.6 5.3 5.2  Hemoglobin 12.0 - 15.0 g/dL 14.0 13.7 12.4  Hematocrit 36.0 - 46.0 % 40.4 37.7 35.1(L)  Platelets 150 - 400 K/uL 171 156 167    CMP Latest Ref Rng & Units 11/16/2020 11/13/2020 10/25/2020  Glucose 70 - 99 mg/dL 112(H) - 109(H)  BUN 8 - 23 mg/dL 12 - 17  Creatinine 0.60 - 1.20 mg/dL 1.15 - 1.20(H)  Sodium 135 - 145 mmol/L 138 - 141  Potassium 3.5 - 5.1 mmol/L 3.2(L) - 3.7  Chloride 98 - 111 mmol/L 105 - 107  CO2 22 - 32 mmol/L 20(L) - 25  Calcium 8.9 - 10.3 mg/dL 9.2 9.9 9.1  Total Protein 6.5 - 8.1 g/dL 7.1 - 6.5  Total Bilirubin 0.2 - 1.6 mg/dL 0.3 - 0.4  Alkaline Phos 38 - 126 U/L 75 - 63  AST 11 - 38 U/L 20 - 16  ALT 10 - 47 U/L 13 - 14    RADIOGRAPHIC STUDIES: No results found.  ASSESSMENT & PLAN DAZJA HOUCHIN 80 y.o. 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  Previously I recommend a rituximab monotherapy given her intolerance of BR x 2 cycles. This regimen consisted of rituximab 375 mg/m2 IV q weekly x 4 weeks. During her scans on 8/16 - 05/03/2020 she was shown to have marked response to therapy with normalization of spleen size and resolution of lymphadenopathy. Repeat PET CT scan on 09/12/20 after completed of rituximab monotherapy showed a completed response. We will proceed with q 2 months maintenance/consolidation rituximab x 4 cycles.   GELF Criteria: 1 (splenomegaly). Indication for treatment  # Low Grade Marginal Zone Lymphoma, Stage III -- discontinued bendamustine + rituximab as patient is unable to tolerate this treatment. Will pursue rituximab monotherapy instead. Started therapy with Cycle 1 Day 1 on 03/28/2020 BR, decreased to Ritux alone q weekly x 4 starting 07/06/2020. --patient completed  excisional lymph node biopsy to confirm the diagnosis. Pathological exam of the lymph node confirms marginal zone lymphoma.  --PET CT scan confirms  stage III disease. Patient meets GELF criteria for treatment (splenomegaly). She did not have any B symptoms and her counts were stable at diagnosis, with some mild thrombocytopenia.  --Patient completed monotherapy rituximab x 4 weeks on 08/06/2020.  --post treatment PET CT scan from 09/12/20 showed Deauville Score 1, complete response to therapy. Recommend to proceed with q 8 week rituximab 375mg /m2 maintenance therapy. This can be continued up to 4 doses --Patient started C1D1 of maintenance Rituximab on 11/16/2020.  --Patient returns today for C2D1 of maintenance Rituximab. Labs from today were reviewed without any intervention needed. Patient will proceed with treatment as planned and return to the clinic in 8 weeks prior to C3D1.   #Concern for GI Bleed, resolved #Hematemesis, resolved #Colitis, resolved --patient admitted on 03/30/2020 (day after Cycle 1 chemotherapy) with hematemesis --no overt GI bleeding noted on Upper GI evaluation -- no signs of bleeding since last visit.  --iron levels appeared low previously and patient was having symptoms of iron deficiency anemia including fatigue, restless leg, and dyspnea on exertion. -- Repeat labs today, Hgb continues to be within normal limits.   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   No orders of the defined types were placed in this encounter.   All questions were answered. The patient knows to call the clinic with any problems, questions or concerns.  A total of more than 30 minutes were spent on this encounter and over half of that time was spent on counseling and coordination of care as outlined above.   Dede Query PA-C Department of Hematology/Oncology [facility] Phone: [phone]  01/10/2021 11:02 AM   Literature Support:  NCCN Guidelines: Consolidation with rituximab should be offered if initially treated with single agent rituximab. (MS-63)  UptoDate: Different schedules have been used. The following administration schedules were used in the randomized trials and are equally acceptable approaches:  ?Rituximab 375 mg/m2 intravenously per week for a total of four doses  ?Rituximab 375 mg/m2 intravenously per week for four weeks, followed by four additional doses administered every two months   Patient was seen with Dr. Berkley Wrightsman [place].    ADDENDUM  .Patient was Personally and independently interviewed, examined and relevant elements of the history of present illness were reviewed in details and an assessment and plan was created. All elements of the patient's history of present illness , assessment and plan were discussed in details with Dede Query PA-C. The above documentation reflects our combined findings assessment and plan.  Patients labs stable. Mild hypokalemia- increase po potassium intake. No significant clinical toxicities from Rituxan. Patient appropriate to proceed with next cycle of maintenance Rituxan today. Orders reviewed and signed.  Sullivan Lone MD MS

## 2021-01-14 ENCOUNTER — Telehealth: Payer: Self-pay | Admitting: Physician Assistant

## 2021-01-14 NOTE — Telephone Encounter (Signed)
Scheduled per los. Called and was not able to leave msg. Mailed printout

## 2021-01-17 ENCOUNTER — Telehealth: Payer: Self-pay

## 2021-01-17 ENCOUNTER — Ambulatory Visit: Payer: Medicare Other | Admitting: Neurology

## 2021-01-17 ENCOUNTER — Encounter: Payer: Self-pay | Admitting: Neurology

## 2021-01-17 ENCOUNTER — Other Ambulatory Visit: Payer: Self-pay

## 2021-01-17 ENCOUNTER — Other Ambulatory Visit: Payer: Medicare Other

## 2021-01-17 VITALS — BP 154/79 | HR 85 | Ht 61.0 in | Wt 133.0 lb

## 2021-01-17 DIAGNOSIS — F0390 Unspecified dementia without behavioral disturbance: Secondary | ICD-10-CM

## 2021-01-17 DIAGNOSIS — R519 Headache, unspecified: Secondary | ICD-10-CM | POA: Diagnosis not present

## 2021-01-17 DIAGNOSIS — F03A0 Unspecified dementia, mild, without behavioral disturbance, psychotic disturbance, mood disturbance, and anxiety: Secondary | ICD-10-CM

## 2021-01-17 LAB — SEDIMENTATION RATE: Sed Rate: 11 mm/hr (ref 0–30)

## 2021-01-17 MED ORDER — DONEPEZIL HCL 10 MG PO TABS: ORAL_TABLET | ORAL | 11 refills | Status: DC

## 2021-01-17 NOTE — Telephone Encounter (Signed)
Spoke with pt daughter and pt informed them the bloodwork was normal, no evidence of inflammation

## 2021-01-17 NOTE — Patient Instructions (Addendum)
1. Schedule MRI brain with and without contrast  2. Bloodwork for ESR  3. Proceed with Neurocognitive testing  4. Start Donepezil $RemoveBeforeD'10mg'[id number]$ : Take 1/2 tablet daily for 2 weeks, then increase to 1 tablet daily  5. Recommend increased supervision with medications  6. Recommend no further driving  7. Follow-up after tests   FALL PRECAUTIONS: Be cautious when walking. Scan the area for obstacles that may increase the risk of trips and falls. When getting up in the mornings, sit up at the edge of the bed for a few minutes before getting out of bed. Consider elevating the bed at the head end to avoid drop of blood pressure when getting up. Walk always in a well-lit room (use night lights in the walls). Avoid area rugs or power cords from appliances in the middle of the walkways. Use a walker or a cane if necessary and consider physical therapy for balance exercise. Get your eyesight checked regularly.  FINANCIAL OVERSIGHT: Supervision, especially oversight when making financial decisions or transactions is also recommended.  HOME SAFETY: Consider the safety of the kitchen when operating appliances like stoves, microwave oven, and blender. Consider having supervision and share cooking responsibilities until no longer able to participate in those. Accidents with firearms and other hazards in the house should be identified and addressed as well.  DRIVING: Regarding driving, in patients with progressive memory problems, driving will be impaired. We advise to have someone else do the driving if trouble finding directions or if minor accidents are reported. Independent driving assessment is available to determine safety of driving.  ABILITY TO BE LEFT ALONE: If patient is unable to contact 911 operator, consider using LifeLine, or when the need is there, arrange for someone to stay with patients. Smoking is a fire hazard, consider supervision or cessation. Risk of wandering should be assessed by caregiver and  if detected at any point, supervision and safe proof recommendations should be instituted.  MEDICATION SUPERVISION: Inability to self-administer medication needs to be constantly addressed. Implement a mechanism to ensure safe administration of the medications.  RECOMMENDATIONS FOR ALL PATIENTS WITH MEMORY PROBLEMS: 1. Continue to exercise (Recommend 30 minutes of walking everyday, or 3 hours every week) 2. Increase social interactions - continue going to [place] and enjoy social gatherings with friends and family 3. Eat healthy, avoid fried foods and eat more fruits and vegetables 4. Maintain adequate blood pressure, blood sugar, and blood cholesterol level. Reducing the risk of stroke and cardiovascular disease also helps promoting better memory. 5. Avoid stressful situations. Live a simple life and avoid aggravations. Organize your time and prepare for the next day in anticipation. 6. Sleep well, avoid any interruptions of sleep and avoid any distractions in the bedroom that may interfere with adequate sleep quality 7. Avoid sugar, avoid sweets as there is a strong link between excessive sugar intake, diabetes, and cognitive impairment We discussed the Mediterranean diet, which has been shown to help patients reduce the risk of progressive memory disorders and reduces cardiovascular risk. This includes eating fish, eat fruits and green leafy vegetables, nuts like almonds and hazelnuts, walnuts, and also use olive oil. Avoid fast foods and fried foods as much as possible. Avoid sweets and sugar as sugar use has been linked to worsening of memory function.  There is always a concern of gradual progression of memory problems. If this is the case, then we may need to adjust level of care according to patient needs. Support, both to the patient and caregiver, should  then be put into place.

## 2021-01-17 NOTE — Progress Notes (Signed)
NEUROLOGY CONSULTATION NOTE  Elizabeth Mcintyre MRN: [id number] DOB: May 27, 1941  Referring provider: Dr. Howard Mcintyre Primary care provider: Dr. Howard Mcintyre  Reason for consult:  Memory loss  Dear Dr Elizabeth Mcintyre:  Thank you for your kind referral of Elizabeth Mcintyre for consultation of the above symptoms. Although her history is well known to you, please allow me to reiterate it for the purpose of our medical record. The patient was accompanied to the clinic by her daughter Elizabeth Mcintyre who also provides collateral information. Records and images were personally reviewed where available.   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 She started noticing changes a year ago, stating "I don't have a memory." Her daughter Elizabeth Mcintyre started noticing changes around 2 years ago when she would get lost driving to Elizabeth Mcintyre. She was repeating herself more. She had been diagnosed with lymphoma and they initially attributed this to "chemo fog." She is scheduled for 2 more sessions of immunotherapy then she will be done for now. Symptoms have been the same over the past year. She drives on occasion still and would get lost but find her way back or call her daughter. She would not recall how to get to her Mcintyre of 30 years. She was missing bills/taxes 2 years ago, Elizabeth Mcintyre started helping her last year. When she tried to organize her bills and write them down, she would get confused and rearrange her system, then forget the system she made. Elizabeth Mcintyre has been helping with her pillbox but would come and see pills there a week later. She denies leaving the stove on. She misplaces things constantly. She has been living with a housemate for the past 12 years who stays on the other side of the Mcintyre, she is usually by herself. She is independent with  dressing and bathing.   She reports a sensation on the left frontal region going down her face for the past month. She says if feels funny, her neck feels funny, "not pain," but like something running down. Sensation occurs on a daily basis. Her vision is more blurred on the left eye. She notices the left eyelid stays closed especially when she wakes up, she has to pull up her lid. There is some nausea, no vomiting. She describes the sensation is "like a fog," with headache 7/10 going down to 1/10. She has been taking Tylenol every night for the past month, it does not help much. She states that she was previously sleeping well, but for the past month, "I don't sleep." She did not sleep last night, she does not take naps. She denies any dizziness, dysarthria, back pain, focal numbness/tingling/weakness, bowel/bladder dysfunction, anosmia, tremors. She has shoulder pain. No family history of dementia, no history of significant head injuries or alcohol use. She gets depressed once in a while, her brain is trying to figure out what she wants to do next. She has 2 homes and constantly moves things around. Elizabeth Mcintyre denies any paranoia or hallucinations, she knows the patient is overwhelmed because there are things to put in order.   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     Laboratory Data: Lab Results  Component Value Date  TSH 5.70 (H) 11/13/2020   Lab Results  Component Value Date   VITAMINB12 462 11/13/2020     PAST MEDICAL HISTORY: Past Medical History:  Diagnosis Date  . Asthma   . Bloating 04/26/2019  . 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 Cancer ([place]) 2021   Lymphoma  . 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 DIABETES MELLITUS, TYPE II 11/09/2007   diet control  .  Diverticulosis 03/2011  . Fecal incontinence 07/07/2019  . GERD (gastroesophageal reflux disease)    none recently  . GI bleed   . Gout 06/09/2014   R great toe, ball of foot Stopped HCTZ several mos ago,  On losartan       . Hematemesis 03/30/2020  . History of rheumatoid arthritis    during 30's, was treated.  . Hyperlipidemia   . Hypertension   . IBS (irritable bowel syndrome)   . 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 Lower GI bleeding 04/30/2020  . Lymphadenopathy 01/23/2020  . Osteopenia 2017   Last  bone density 05/04/2017: -2.4  . PONV (postoperative nausea and vomiting)   . Schatzki's ring   . Stress incontinence   . Stroke [facility])    mini stroke - found on a CT scan    PAST SURGICAL HISTORY: Past Surgical History:  Procedure Laterality Date  . ABDOMINAL HYSTERECTOMY    . BALLOON DILATION N/A 08/21/2020   Procedure: BALLOON DILATION;  Surgeon: Eloise Harman, DO;  [facility];  Service: Endoscopy;  Laterality: N/A;  . BIOPSY  03/30/2020   Procedure: BIOPSY;  Surgeon: Ronald Lobo, MD;  Location: [facility];  Service: Endoscopy;;  . BIOPSY  05/02/2020   Procedure: BIOPSY;  Surgeon: Harvel Quale, MD;  Location: [facility];  Service: Gastroenterology;;  . CARDIAC CATHETERIZATION     X 2, last one in 1998  . CHOLECYSTECTOMY N/A 04/13/2017   Procedure: LAPAROSCOPIC CHOLECYSTECTOMY;  Surgeon: Aviva Signs, MD;  Location: [facility];  Service: General;  Laterality: N/A;  . COLONOSCOPY    . COLONOSCOPY  May 2012   Dr. Olevia Perches: mild diverticulosis, otherwise normal.   . COLONOSCOPY WITH PROPOFOL N/A 05/02/2020   Dr. Jenetta Downer: 8 mm polyp removed from the ascending colon, 2 mm polyp removed from the ascending colon.  Tubular adenomas.  Diverticulosis.  Mucosal ulceration in the sigmoid colon noted, pathology consistent with ischemic colitis.  Marland Kitchen ESOPHAGOGASTRODUODENOSCOPY N/A 01/28/2015   Dr. Gala Romney: reflux esophagitis, Schatzki's ring not  manipulated due to recent bleeding  . ESOPHAGOGASTRODUODENOSCOPY N/A 03/30/2015   Dr. Gala Romney: Schatzki's ring s/p Venia Minks dilation, previously noted esophageal ulcer completely healed  . ESOPHAGOGASTRODUODENOSCOPY N/A 03/30/2020   Buccini: Moderately severe erosive, circumferential, confluent esophagitis with no bleeding found 25 to 40 cm from incisors.  Nonobstructing and mild Schatzki ring, there were also multiple distal esophageal rings noted, minimal hiatal hernia.  Marland Kitchen ESOPHAGOGASTRODUODENOSCOPY (EGD) WITH PROPOFOL N/A 08/21/2020   Procedure: ESOPHAGOGASTRODUODENOSCOPY (EGD) WITH PROPOFOL;  Surgeon: Eloise Harman, DO;  [facility];  Service: Endoscopy;  Laterality: N/A;  2:15pm  . IR IMAGING GUIDED PORT INSERTION  03/13/2020  . LYMPH NODE BIOPSY Left 03/20/2020   Procedure: LEFT POSTERIOR CERVICAL LYMPH NODE BIOPSY;  Surgeon: Georganna Skeans, MD;  Location: [facility];  Service: General;  Laterality: Left;  Marland Kitchen MALONEY DILATION N/A 03/30/2015   Procedure: Venia Minks DILATION;  Surgeon: Daneil Dolin, MD;  [facility];  Service: Endoscopy;  Laterality: N/A;  . POLYPECTOMY  05/02/2020   Procedure: POLYPECTOMY;  Surgeon: Harvel Quale, MD;  [facility];  Service: Gastroenterology;;    MEDICATIONS: Current Outpatient Medications on File Prior to Visit  Medication Sig Dispense Refill  . acetaminophen (TYLENOL) 325 MG tablet Take 325 mg by mouth every 6 (six) hours as needed for moderate pain.     Marland Kitchen albuterol (VENTOLIN HFA) 108 (90 Base) MCG/ACT inhaler Inhale 2 puffs into the lungs every 6 (six) hours as needed for wheezing. 8.5 g 2  . amLODipine (NORVASC) 10 MG tablet Take 1 tablet (10 mg total) by mouth daily. 90 tablet 1  . cetirizine (ZYRTEC) 10 MG tablet Take 10 mg by mouth at bedtime. As needed    . Cholecalciferol (VITAMIN D3) 25 MCG (1000 UT) CAPS Take 1,000 Units by mouth daily.     Marland Kitchen levothyroxine (SYNTHROID) 88 MCG tablet Take 1 tablet (88 mcg  total) by mouth daily. 90 tablet 4  . lidocaine-prilocaine (EMLA) cream Apply 1 application topically as needed. (Patient taking differently: Apply 1 application topically as needed (port access).) 30 g 1  . pantoprazole (PROTONIX) 40 MG tablet Take 1 tablet (40 mg total) by mouth daily. 90 tablet 3  . potassium chloride SA (KLOR-CON) 20 MEQ tablet Take 1 tablet (20 mEq total) by mouth daily. 21 tablet 0  . rOPINIRole (REQUIP) 0.25 MG tablet Take 1 tablet (0.25 mg total) by mouth at bedtime. 90 tablet 1  . rosuvastatin (CRESTOR) 20 MG tablet Take 1 tablet (20 mg total) by mouth daily. 90 tablet 3  . sucralfate (CARAFATE) 1 g tablet Take 1 g by mouth 4 (four) times daily -  with meals and at bedtime. As needed    . vitamin B-12 (CYANOCOBALAMIN) 1000 MCG tablet Take 1,000 mcg by mouth daily.      No current facility-administered medications on file prior to visit.    ALLERGIES: Allergies  Allergen Reactions  . Codeine Phosphate Nausea And Vomiting and Rash    FAMILY HISTORY: Family History  Problem Relation Age of Onset  . Heart disease Mother   . Osteoarthritis Mother   . Sudden death Father   . Single kidney Father   . Other Father        h/o severe MVA injuries  . Hyperlipidemia Sister   . Other Daughter        Myalgias  . Fibromyalgia Daughter   . Allergies Daughter   . Heart disease Maternal Grandfather   . Sudden death Paternal Grandmother   . Diabetes Paternal Grandfather   . Heart disease Daughter   . Other Daughter        palpitations  . Pulmonary fibrosis Maternal Aunt   . Cancer Paternal Uncle   . Pulmonary fibrosis Maternal Aunt   . Colon cancer Neg Hx     SOCIAL HISTORY: Social History   Socioeconomic History  . Marital status: Widowed    Spouse name: Not on file  . Number of children: 2  . Years of education: Not on file  . Highest education level: Not on file  Occupational History  . Occupation: retired  Tobacco Use  . Smoking status: Never Smoker   . Smokeless tobacco: Never Used  Vaping Use  . Vaping Use: Never used  Substance and Sexual Activity  . Alcohol use: No  . Drug use: No  . Sexual activity: Never  Other Topics Concern  . Not on file  Social  History Narrative   Ms. Maffeo is widowed. Her young grandson lives with her, for whom she shares custody with her daughter, the son's aunt.     Right handed    Social Determinants of Health   Financial Resource Strain: Not on file  Food Insecurity: Not on file  Transportation Needs: Not on file  Physical Activity: Not on file  Stress: Not on file  Social Connections: Not on file  Intimate Partner Violence: Not on file     PHYSICAL EXAM: Vitals:   01/17/21 0850  BP: (!) 154/79  Pulse: 85  SpO2: 99%   General: No acute distress Head:  Normocephalic/atraumatic Skin/Extremities: No rash, no edema Neurological Exam: Mental status: alert and oriented to person, place, and time, no dysarthria or aphasia, Fund of knowledge is appropriate.  Recent and remote memory are impaired.  Attention and concentration are reduced. Difficulty with naming and repetition. [place] 14/30 Montreal Cognitive Assessment  01/17/2021  Visuospatial/ Executive (0/5) 1  Naming (0/3) 0  Attention: Read list of digits (0/2) 2  Attention: Read list of letters (0/1) 0  Attention: Serial 7 subtraction starting at 100 (0/3) 2  Language: Repeat phrase (0/2) 1  Language : Fluency (0/1) 0  Abstraction (0/2) 0  Delayed Recall (0/5) 1  Orientation (0/6) 6  Total 13  Adjusted Score (based on education) 14    Cranial nerves: CN I: not tested CN II: pupils equal, round and reactive to light, visual fields intact CN III, IV, VI:  full range of motion, no nystagmus, no ptosis CN V: reports increased cold and pin on left V1 distribution CN VII: upper and lower face symmetric CN VIII: hearing intact to conversation CN XI: sternocleidomastoid and trapezius muscles intact CN XII: tongue midline Bulk &  Tone: normal, no fasciculations. Motor: 5/5 throughout with no pronator drift. Sensation: intact to light touch, cold, pin, vibration sense.  No extinction to double simultaneous stimulation.  Romberg test negative Deep Tendon Reflexes: +2 throughout Cerebellar: no incoordination on finger to nose testing Gait: narrow-based and steady, able to tandem walk adequately. 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 Her neurological exam shows subjective sensory changes over the left V1 region. Over the past month she has had new symptoms of headache and paresthesias over the left frontal region, with some blurred vision on the left eye. MOCA score today 14/30. She has been having more difficulties with complex tasks over the past 2 years, raising concern for mild dementia, etiology unclear. With change in symptoms, would repeat MRI brain with and without contrast. Check ESR. Proceed with scheduled Neurocognitive testing to further evaluate cognitive concerns. We discussed starting Donepezil, including side effects and expectations, start Donepezil 78m 1/2 tablet daily for 2 weeks, then increase to 1 tablet daily. Discussed increasing supervision at home with medications and no further driving. Follow-up after tests, they know to call for any changes.   Thank you for allowing me to participate in the care of this patient. Please do not hesitate to call for any questions or concerns.   KEllouise Newer M.D.  CC: Dr. KRaoul Mcintyre Dr. PPosey Pronto

## 2021-01-17 NOTE — Telephone Encounter (Signed)
-----   Message from Cameron Sprang, MD sent at 01/17/2021 12:36 PM EDT ----- Pls let daughter know the bloodwork was normal, no evidence of inflammation. Thanks

## 2021-01-23 ENCOUNTER — Encounter: Payer: Self-pay | Admitting: Hematology and Oncology

## 2021-01-30 ENCOUNTER — Encounter (HOSPITAL_COMMUNITY): Payer: Self-pay

## 2021-01-30 ENCOUNTER — Emergency Department (HOSPITAL_COMMUNITY): Payer: Medicare Other

## 2021-01-30 ENCOUNTER — Ambulatory Visit: Payer: Medicare Other | Admitting: Internal Medicine

## 2021-01-30 ENCOUNTER — Emergency Department (HOSPITAL_COMMUNITY)
Admission: EM | Admit: 2021-01-30 | Discharge: 2021-01-30 | Disposition: A | Discharge status: Home / Self Care | Payer: Medicare Other | Attending: Emergency Medicine | Admitting: Emergency Medicine

## 2021-01-30 ENCOUNTER — Telehealth: Payer: Self-pay

## 2021-01-30 ENCOUNTER — Other Ambulatory Visit: Payer: Self-pay

## 2021-01-30 DIAGNOSIS — Z8572 Personal history of non-Hodgkin lymphomas: Secondary | ICD-10-CM | POA: Insufficient documentation

## 2021-01-30 DIAGNOSIS — R079 Chest pain, unspecified: Secondary | ICD-10-CM | POA: Insufficient documentation

## 2021-01-30 DIAGNOSIS — Z20822 Contact with and (suspected) exposure to covid-19: Secondary | ICD-10-CM | POA: Insufficient documentation

## 2021-01-30 DIAGNOSIS — N1831 Chronic kidney disease, stage 3a: Secondary | ICD-10-CM | POA: Diagnosis not present

## 2021-01-30 DIAGNOSIS — R519 Headache, unspecified: Secondary | ICD-10-CM | POA: Diagnosis not present

## 2021-01-30 DIAGNOSIS — R0789 Other chest pain: Secondary | ICD-10-CM | POA: Diagnosis not present

## 2021-01-30 DIAGNOSIS — R5383 Other fatigue: Secondary | ICD-10-CM | POA: Insufficient documentation

## 2021-01-30 DIAGNOSIS — E039 Hypothyroidism, unspecified: Secondary | ICD-10-CM | POA: Diagnosis not present

## 2021-01-30 DIAGNOSIS — R1110 Vomiting, unspecified: Secondary | ICD-10-CM | POA: Insufficient documentation

## 2021-01-30 DIAGNOSIS — Z79899 Other long term (current) drug therapy: Secondary | ICD-10-CM | POA: Insufficient documentation

## 2021-01-30 DIAGNOSIS — E119 Type 2 diabetes mellitus without complications: Secondary | ICD-10-CM | POA: Insufficient documentation

## 2021-01-30 DIAGNOSIS — J45909 Unspecified asthma, uncomplicated: Secondary | ICD-10-CM | POA: Diagnosis not present

## 2021-01-30 DIAGNOSIS — R42 Dizziness and giddiness: Secondary | ICD-10-CM | POA: Insufficient documentation

## 2021-01-30 DIAGNOSIS — I129 Hypertensive chronic kidney disease with stage 1 through stage 4 chronic kidney disease, or unspecified chronic kidney disease: Secondary | ICD-10-CM | POA: Diagnosis not present

## 2021-01-30 DIAGNOSIS — R310 Gross hematuria: Secondary | ICD-10-CM | POA: Diagnosis not present

## 2021-01-30 DIAGNOSIS — R109 Unspecified abdominal pain: Secondary | ICD-10-CM | POA: Diagnosis not present

## 2021-01-30 DIAGNOSIS — N2889 Other specified disorders of kidney and ureter: Secondary | ICD-10-CM | POA: Diagnosis not present

## 2021-01-30 LAB — CBC
HCT: 37.5 % (ref 36.0–46.0)
Hemoglobin: 12.8 g/dL (ref 12.0–15.0)
MCH: 30 pg (ref 26.0–34.0)
MCHC: 34.1 g/dL (ref 30.0–36.0)
MCV: 87.8 fL (ref 80.0–100.0)
Platelets: 186 10*3/uL (ref 150–400)
RBC: 4.27 MIL/uL (ref 3.87–5.11)
RDW: 13 % (ref 11.5–15.5)
WBC: 6.9 10*3/uL (ref 4.0–10.5)
nRBC: 0 % (ref 0.0–0.2)

## 2021-01-30 LAB — URINALYSIS, ROUTINE W REFLEX MICROSCOPIC
Bilirubin Urine: NEGATIVE
Glucose, UA: NEGATIVE mg/dL
Hgb urine dipstick: NEGATIVE
Ketones, ur: 5 mg/dL — AB
Leukocytes,Ua: NEGATIVE
Nitrite: NEGATIVE
Protein, ur: NEGATIVE mg/dL
Specific Gravity, Urine: 1.005 (ref 1.005–1.030)
pH: 7 (ref 5.0–8.0)

## 2021-01-30 LAB — BASIC METABOLIC PANEL
Anion gap: 11 (ref 5–15)
BUN: 24 mg/dL — ABNORMAL HIGH (ref 8–23)
CO2: 26 mmol/L (ref 22–32)
Calcium: 9.7 mg/dL (ref 8.9–10.3)
Chloride: 100 mmol/L (ref 98–111)
Creatinine, Ser: 1.36 mg/dL — ABNORMAL HIGH (ref 0.44–1.00)
GFR, Estimated: 40 mL/min — ABNORMAL LOW (ref >60)
Glucose, Bld: 106 mg/dL — ABNORMAL HIGH (ref 70–99)
Potassium: 3.8 mmol/L (ref 3.5–5.1)
Sodium: 137 mmol/L (ref 135–145)

## 2021-01-30 LAB — HEPATIC FUNCTION PANEL
ALT: 16 U/L (ref 0–44)
AST: 19 U/L (ref 15–41)
Albumin: 4.5 g/dL (ref 3.5–5.0)
Alkaline Phosphatase: 76 U/L (ref 38–126)
Bilirubin, Direct: <0.1 mg/dL (ref 0.0–0.2)
Total Bilirubin: 0.6 mg/dL (ref 0.3–1.2)
Total Protein: 7.3 g/dL (ref 6.5–8.1)

## 2021-01-30 LAB — TROPONIN I (HIGH SENSITIVITY)
Troponin I (High Sensitivity): 3 ng/L (ref <18)
Troponin I (High Sensitivity): 3 ng/L (ref <18)

## 2021-01-30 LAB — BRAIN NATRIURETIC PEPTIDE: B Natriuretic Peptide: 53 pg/mL (ref 0.0–100.0)

## 2021-01-30 IMAGING — CT CT HEAD W/O CM
3 series · 15 of 47 positions shown, 18 images · non-contrast
Comparison: Head MRI [DATE]

CLINICAL DATA: Headaches for 3 weeks.  Nausea.

EXAM:
CT HEAD WITHOUT CONTRAST
TECHNIQUE: Contiguous axial images were obtained from the base of the skull
through the vertex without intravenous contrast.

[Series 2: head w o · axial · 0.41mm/px · z∈[+1426,+1551]mm · 9 of 30 slices shown, 12 images]
[im 3/30  brain]
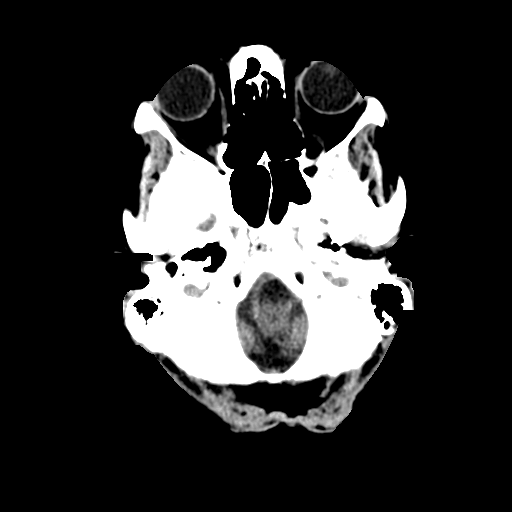
[im 3/30  bone]
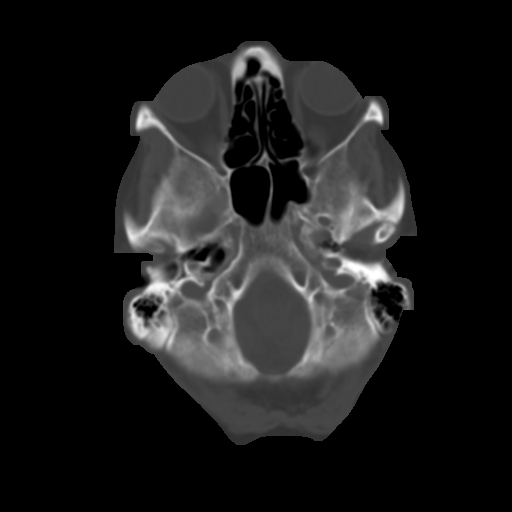
[im 6/30  brain]
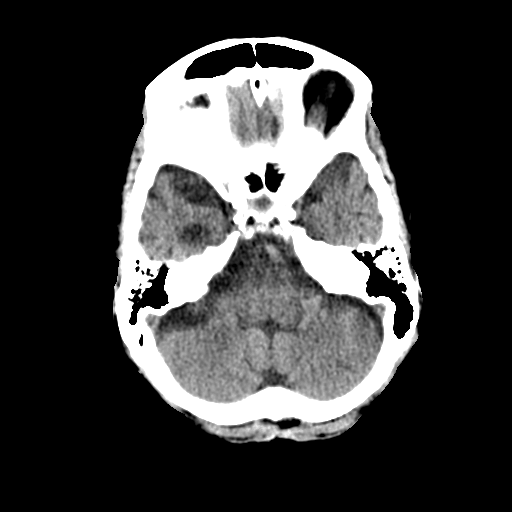
[im 9/30  brain]
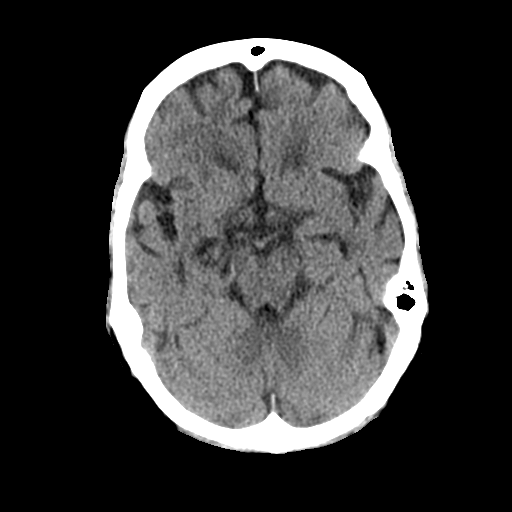
[im 12/30  brain]
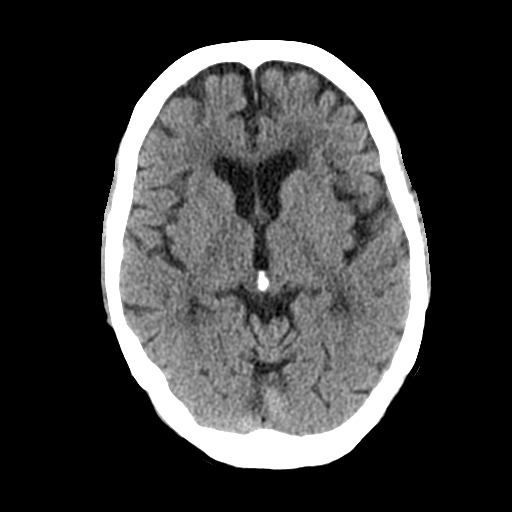
[im 16/30  brain]
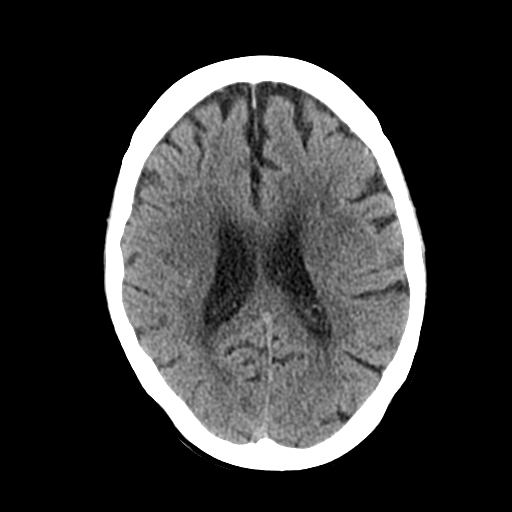
[im 16/30  bone]
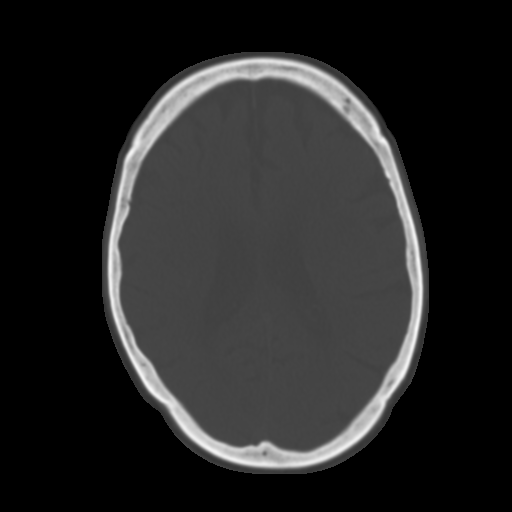
[im 19/30  brain]
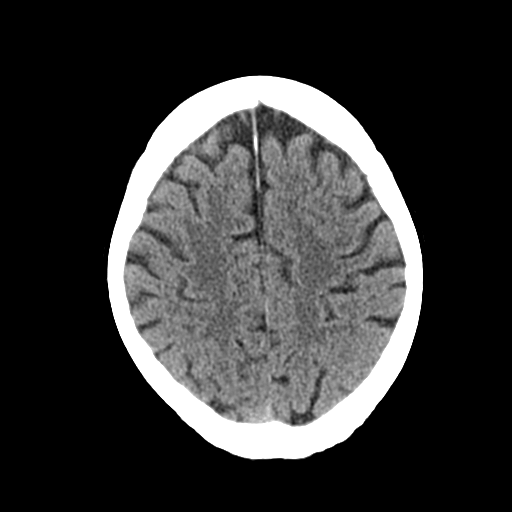
[im 22/30  brain]
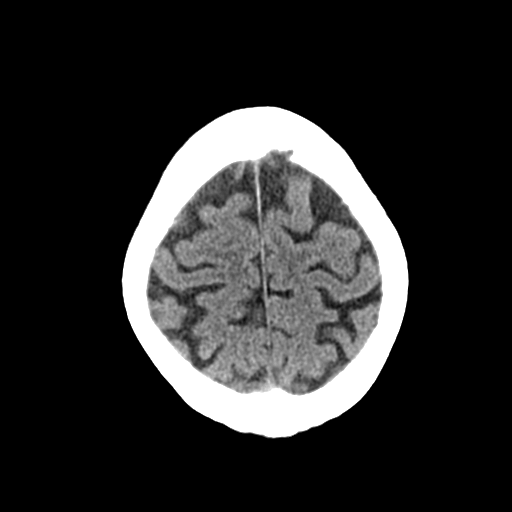
[im 25/30  brain]
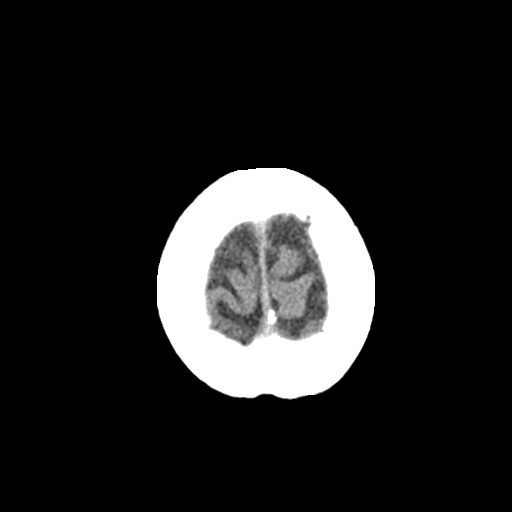
[im 28/30  brain]
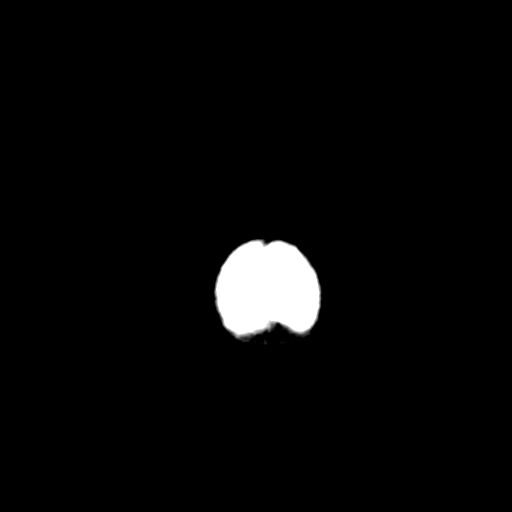
[im 28/30  bone]
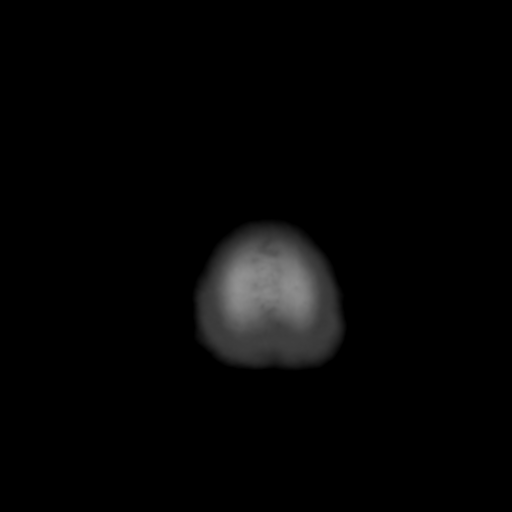

[Series 4: coronal soft · coronal · 0.29mm/px · 3 of 63 slices shown]
[im 21/63  brain]
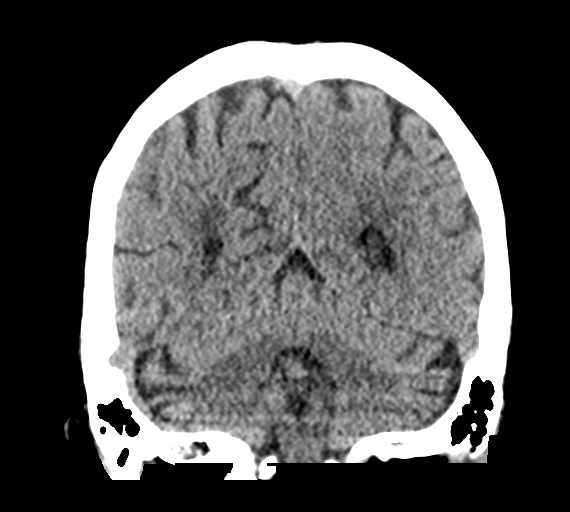
[im 28/63  brain]
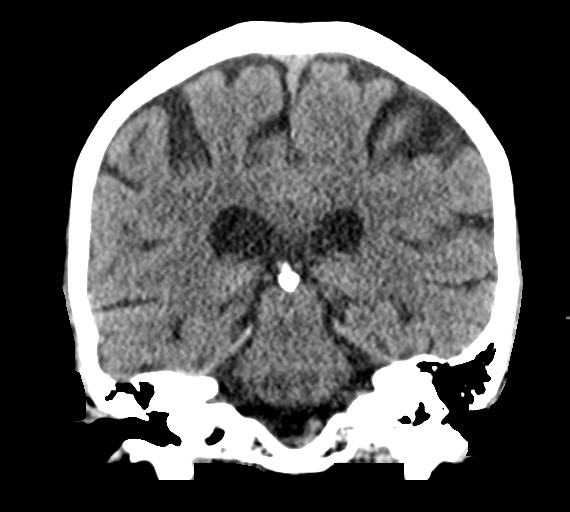
[im 35/63  brain]
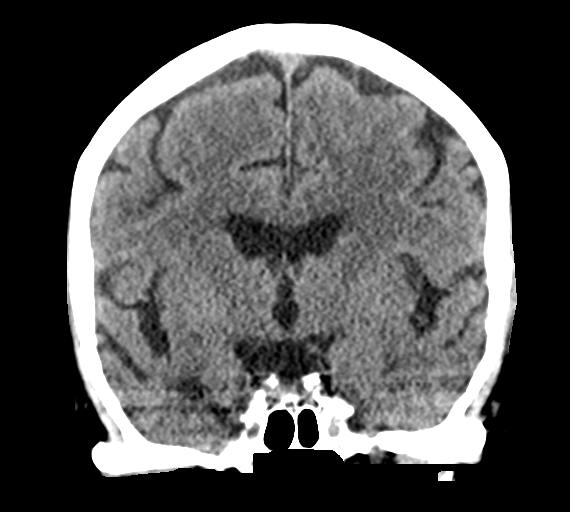

[Series 5: sagittal soft · sagittal · 0.29mm/px · 3 of 50 slices shown]
[im 17/50  brain]
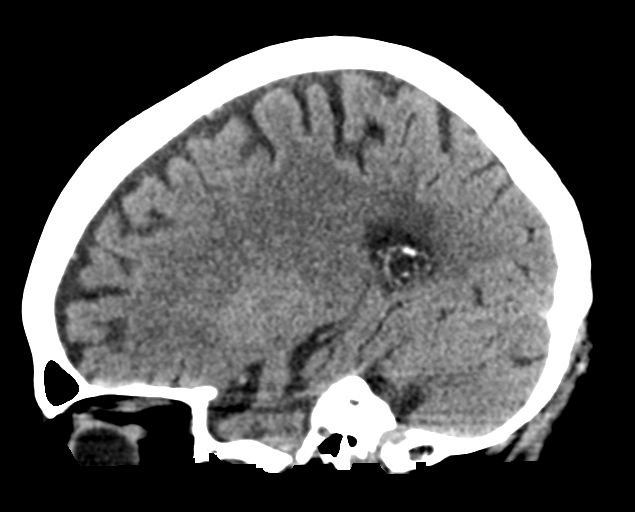
[im 25/50  brain]
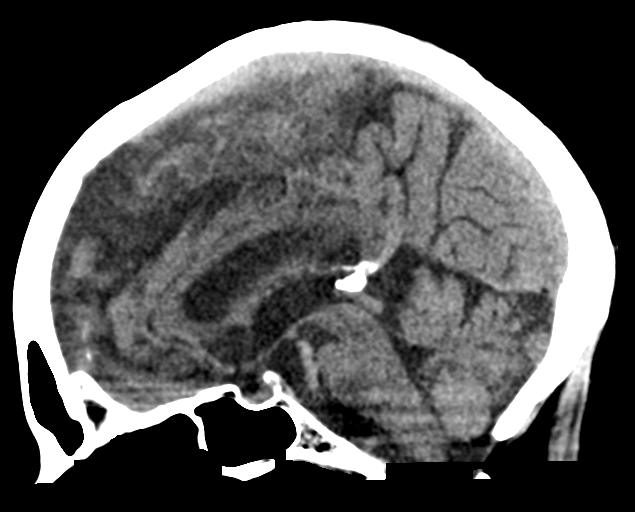
[im 33/50  brain]
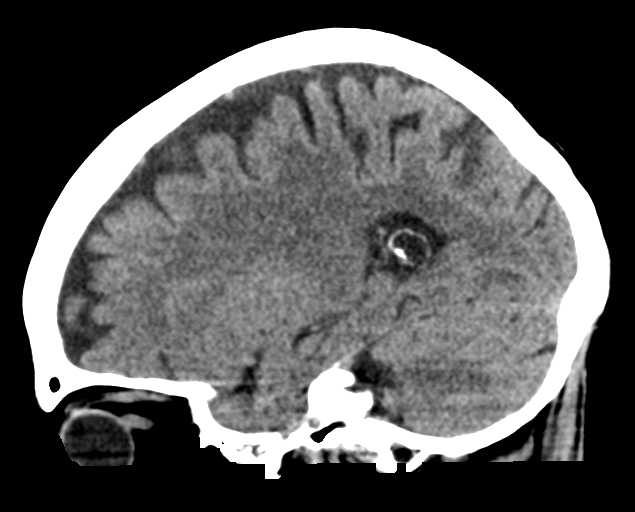

[15 of 47 positions shown; findings below may reference images not displayed]

FINDINGS: Brain: There is no evidence of an acute infarct, intracranial
hemorrhage, mass, midline shift, or extra-axial fluid collection.
Small chronic bilateral cerebellar infarcts are unchanged.
Hypodensities in the cerebral white matter bilaterally are
nonspecific but compatible with mild-to-moderate chronic small
vessel ischemic disease. Mild cerebral atrophy is unchanged.

Vascular: Calcified atherosclerosis at the skull base. No hyperdense
vessel.

Skull: No fracture or suspicious osseous lesion.

Sinuses/Orbits: Visualized paranasal sinuses and mastoid air cells
are clear. Visualized orbits are unremarkable.

Other: None.
IMPRESSION: 1. No evidence of acute intracranial abnormality.
2. Mild-to-moderate chronic small vessel ischemic disease.

## 2021-01-30 IMAGING — DX DG CHEST 2V
2 series · 2 of 2 positions shown · non-contrast
Comparison: [DATE]

CLINICAL DATA: Chest pain

EXAM:
CHEST - 2 VIEW

[chest lat]
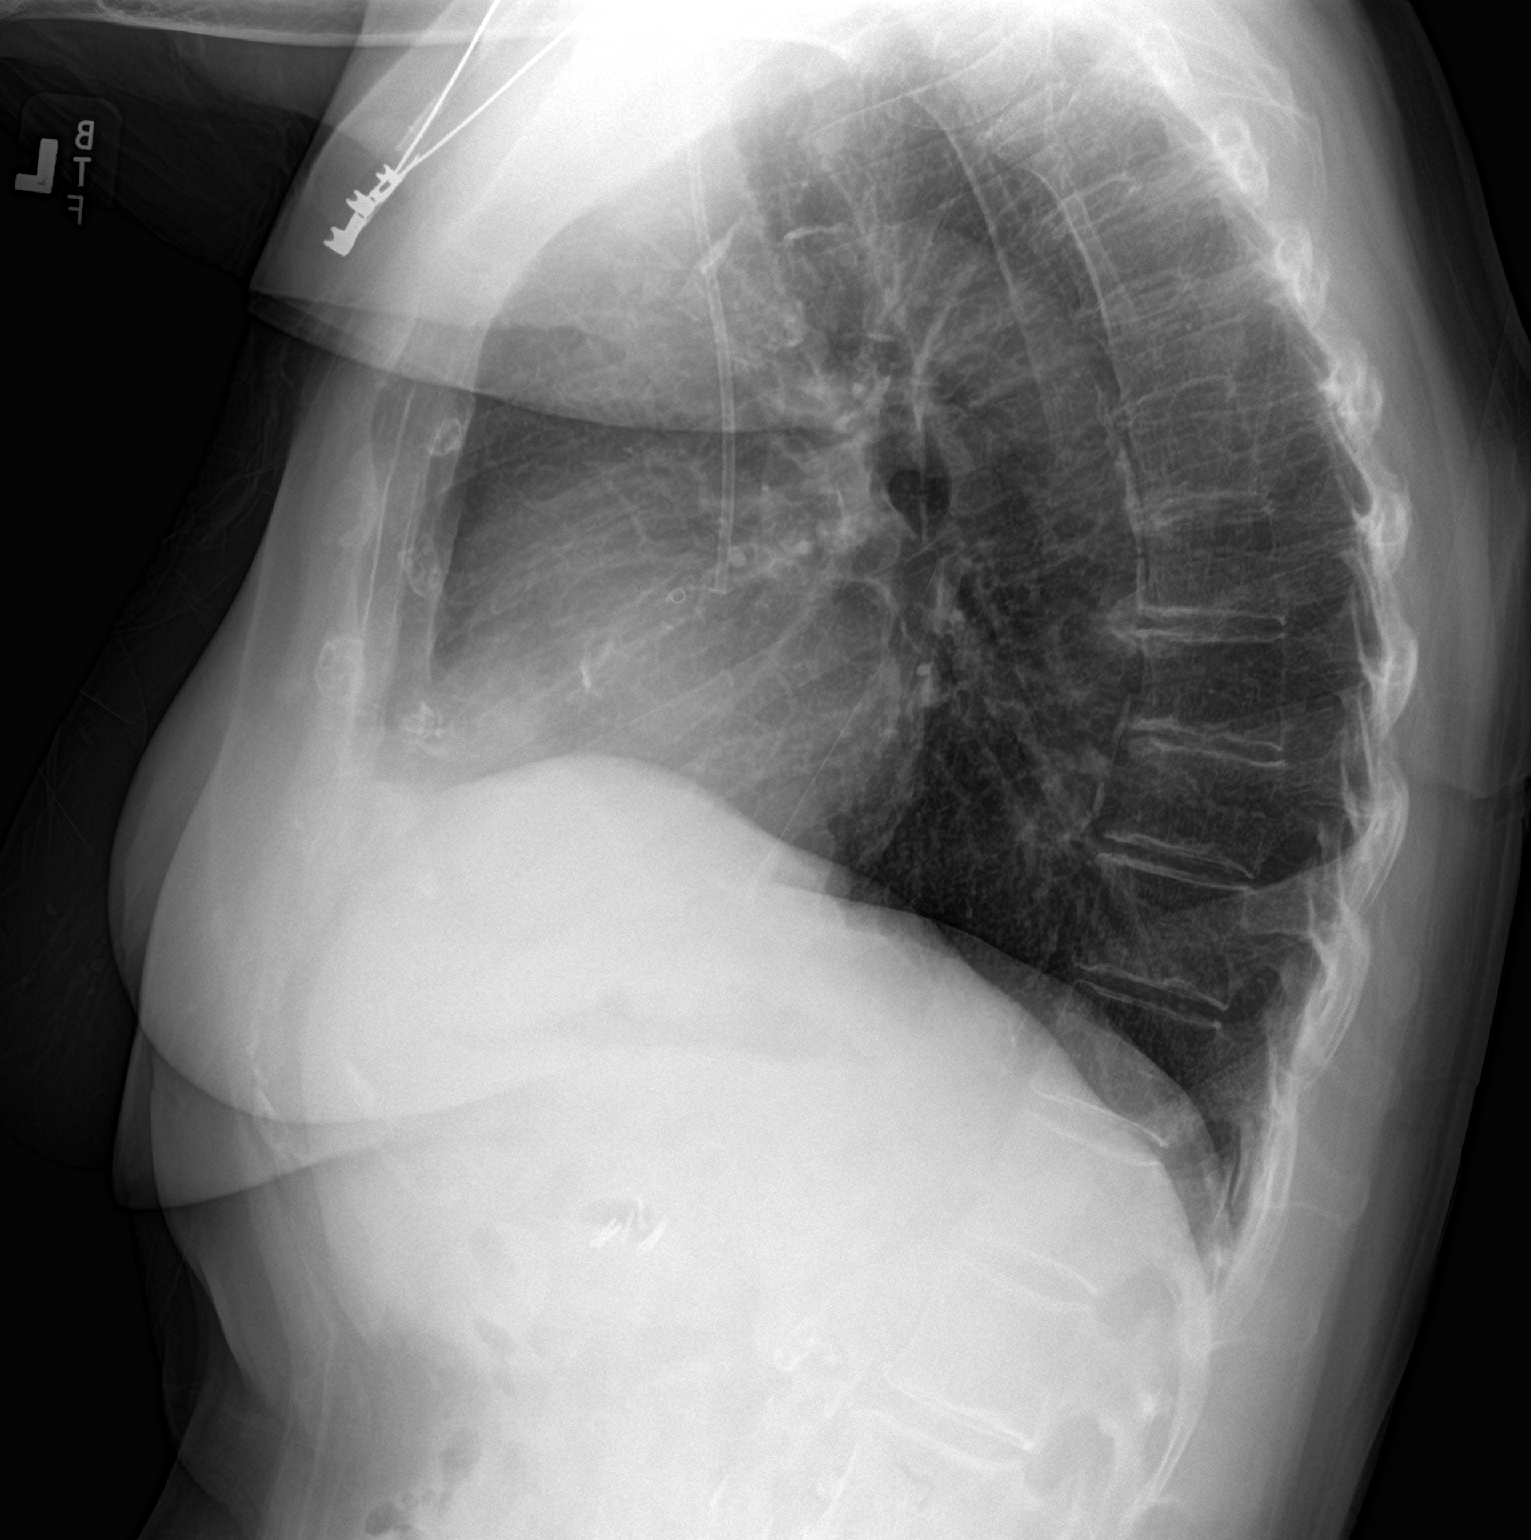

[chest ap]
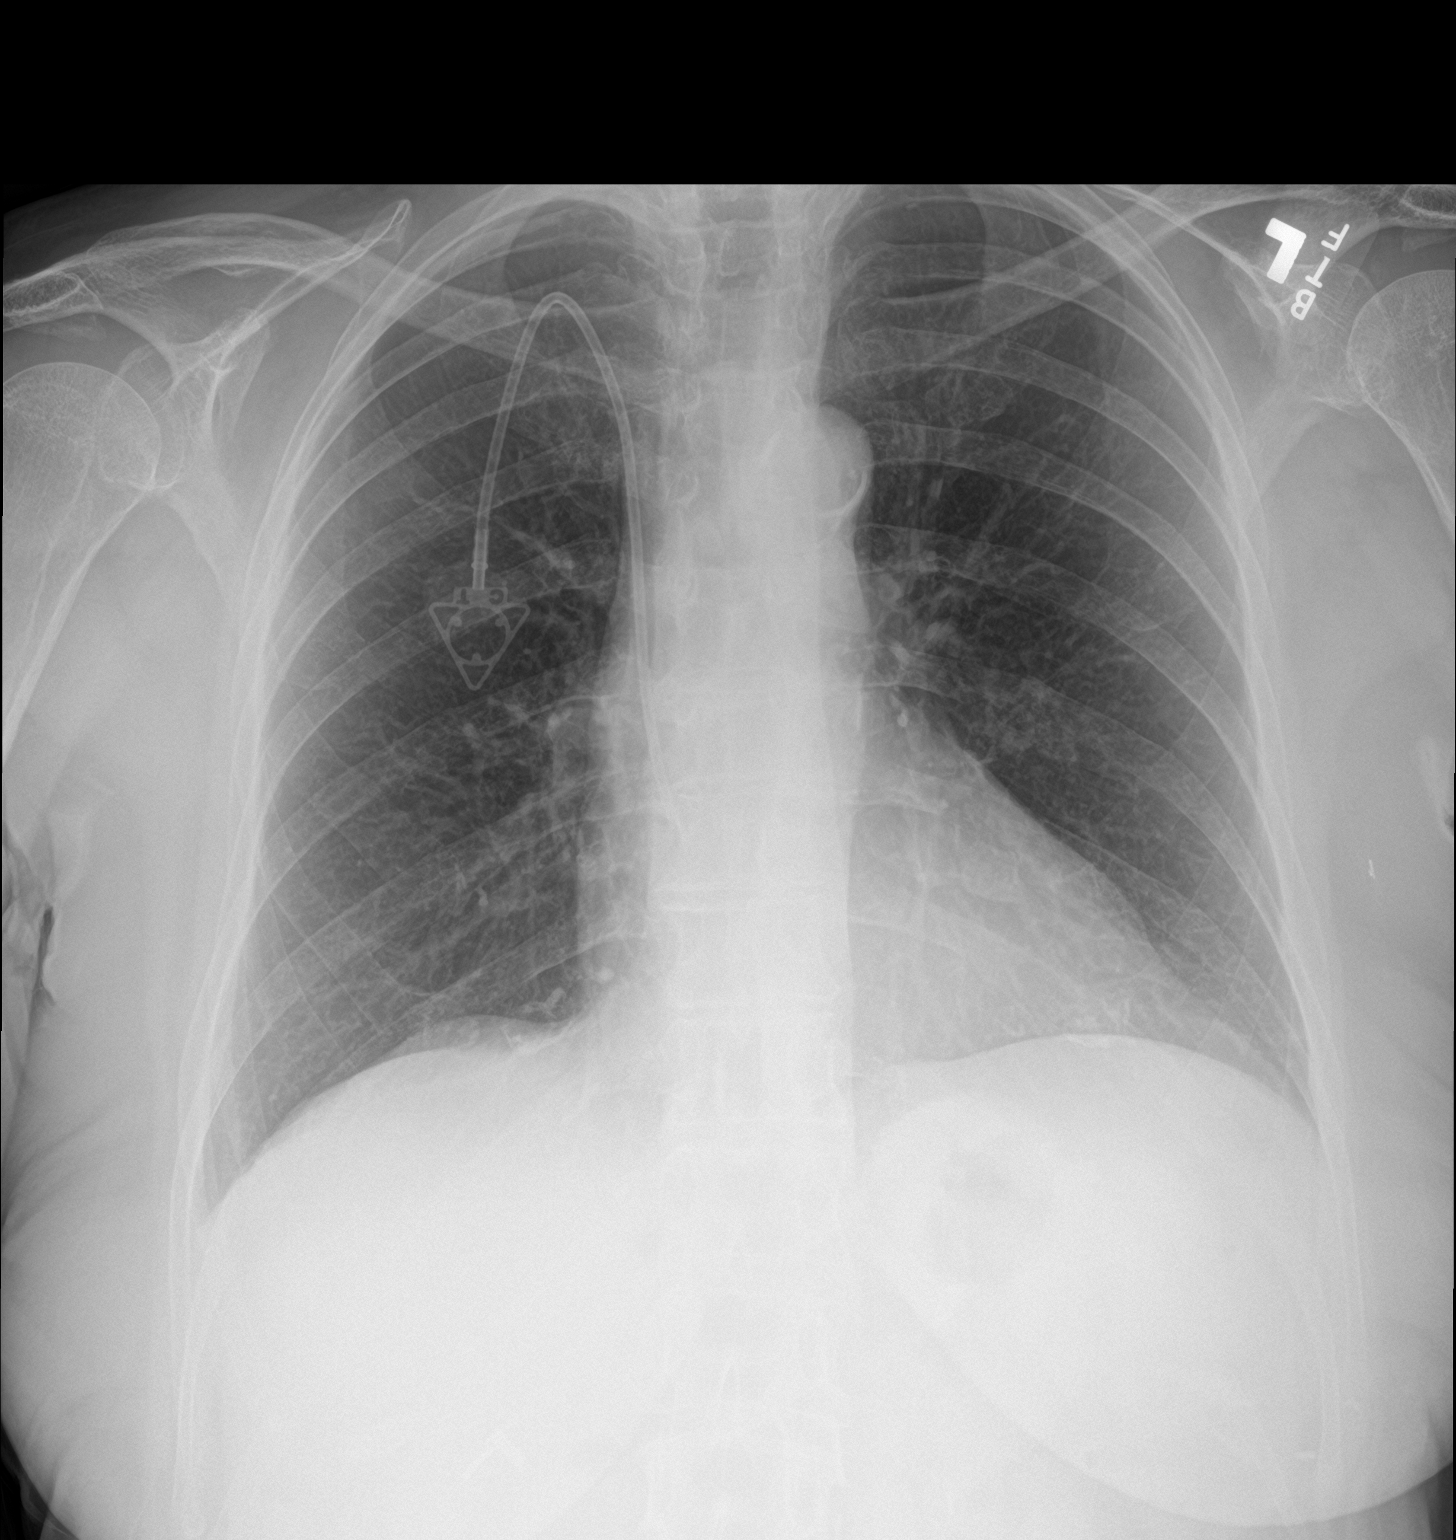

[2 of 2 positions shown; findings below may reference images not displayed]

FINDINGS: Right-sided central venous port tip over the cavoatrial region. No
focal opacity or pleural effusion. Normal cardiomediastinal
silhouette. No pneumothorax. Aortic atherosclerosis.
IMPRESSION: No active cardiopulmonary disease.

## 2021-01-30 IMAGING — CT CT ABD-PELV W/O CM
3 of 6 series · 16 of 46 positions shown, 18 images · non-contrast
Comparison: [DATE]

CLINICAL DATA: Left flank pain and gross hematuria for 3 weeks.

EXAM:
CT ABDOMEN AND PELVIS WITHOUT CONTRAST
TECHNIQUE: Multidetector CT imaging of the abdomen and pelvis was performed
following the standard protocol without IV contrast.

[Series 2: axial st · axial · 0.85mm/px · z∈[-850,-490]mm · 11 of 88 slices shown, 13 images (1 of 2)]
[im 8/88  soft-tissue]
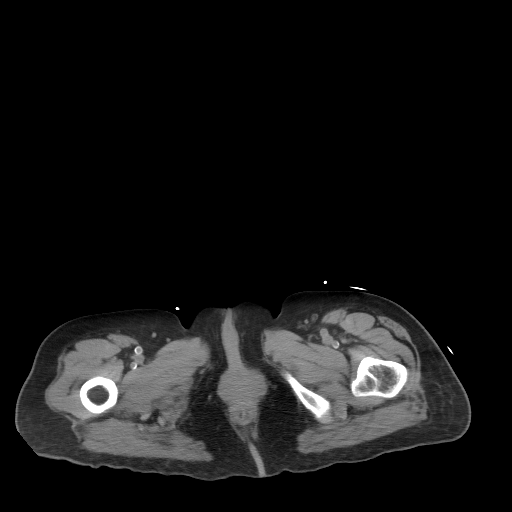
[im 8/88  bone]
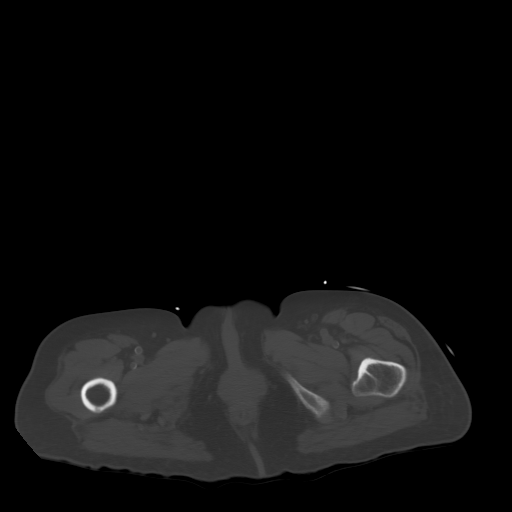
[im 15/88  soft-tissue]
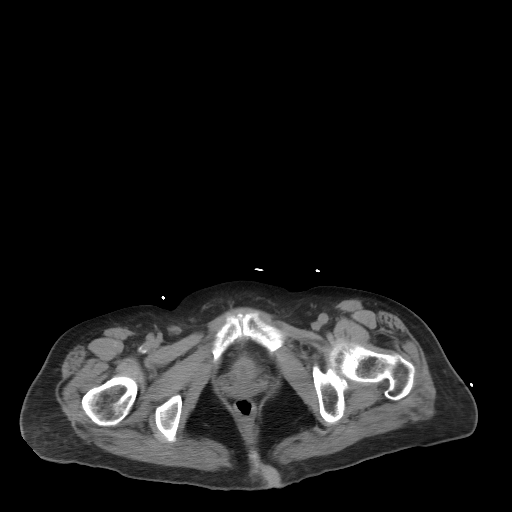
[im 22/88  soft-tissue]
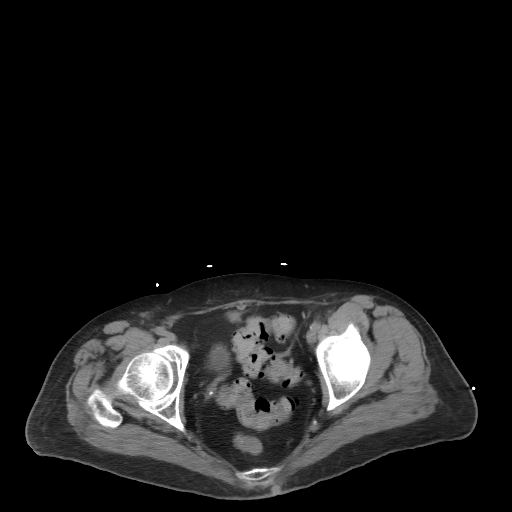
[im 30/88  soft-tissue]
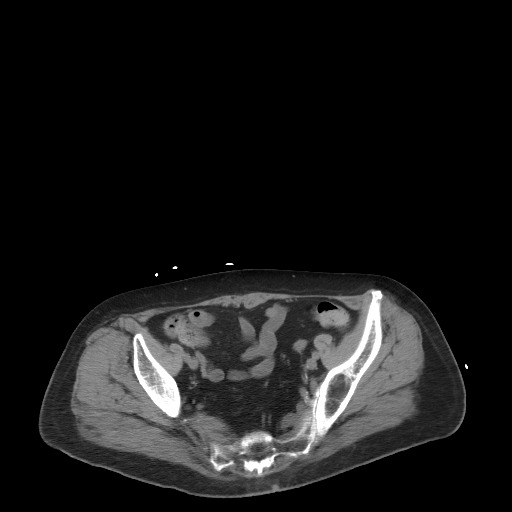
[im 37/88  soft-tissue]
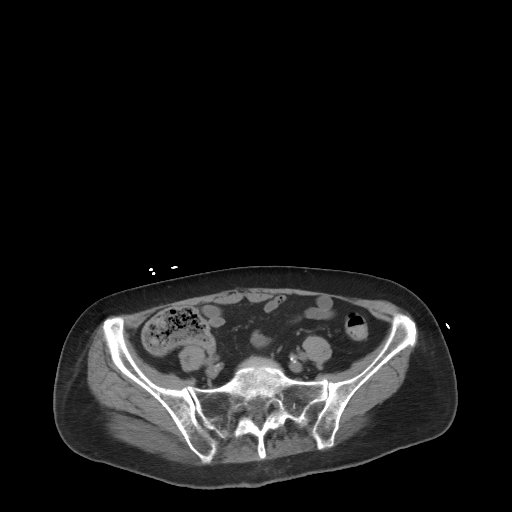
[im 44/88  soft-tissue]
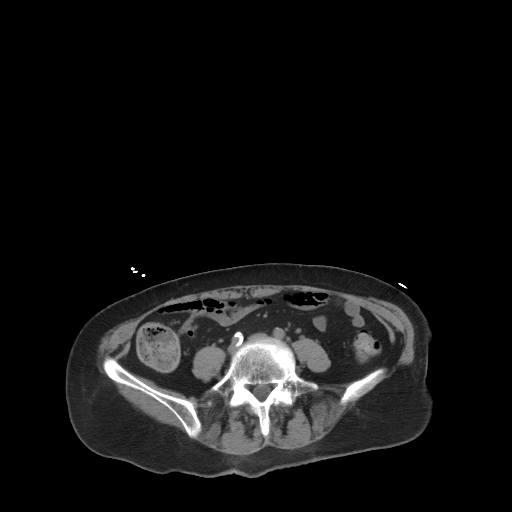
[im 51/88  soft-tissue]
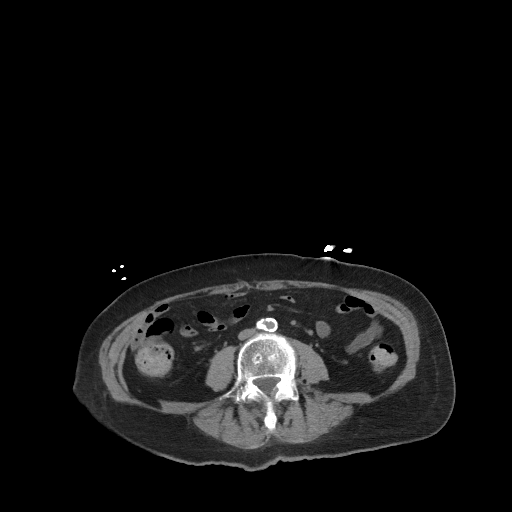
[im 59/88  soft-tissue]
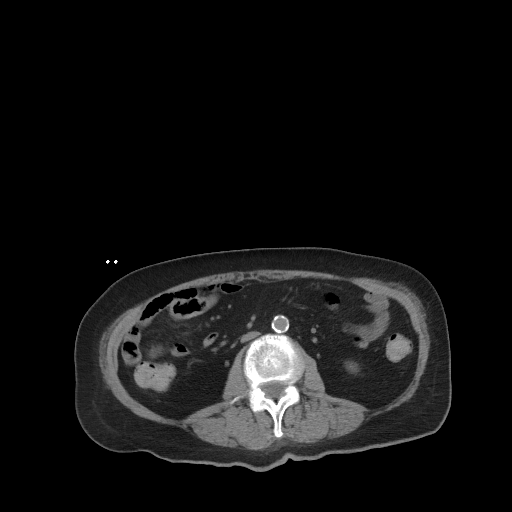
[im 66/88  soft-tissue]
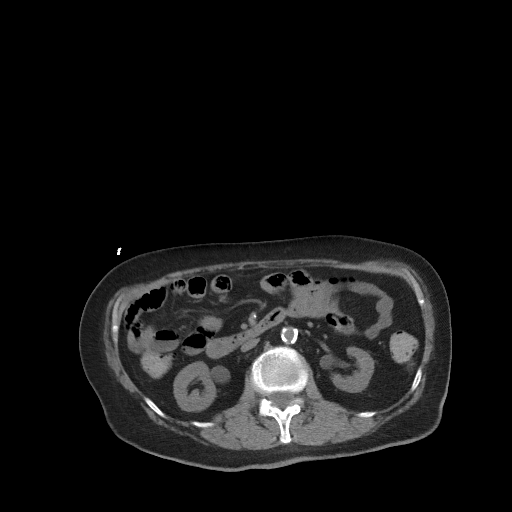
[im 66/88  bone]
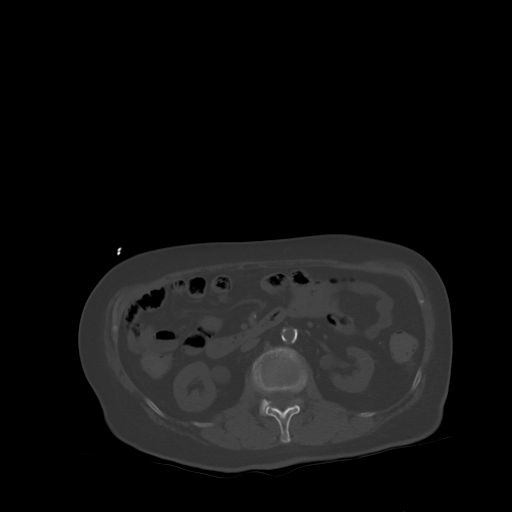
[im 73/88  soft-tissue]
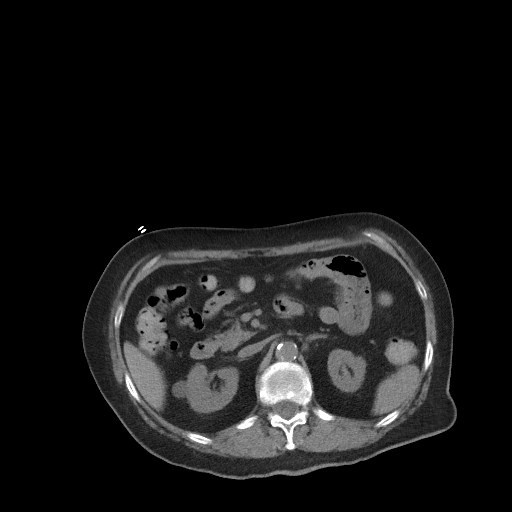
[im 80/88  soft-tissue]
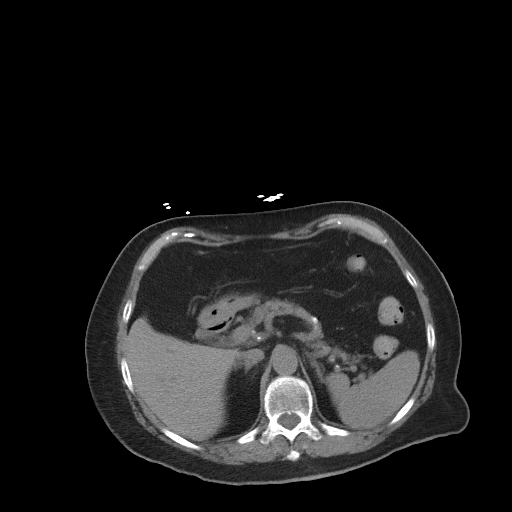

[Series 3: axial st · axial · 0.64mm/px · z∈[-474,-429]mm · 2 of 28 slices shown (2 of 2)]
[im 10/28  soft-tissue]
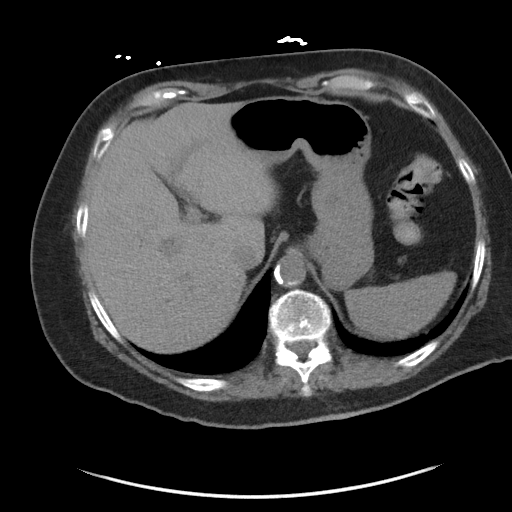
[im 19/28  soft-tissue]
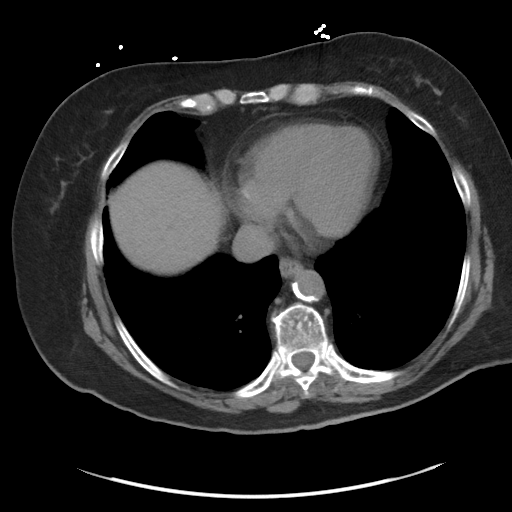

[Series 7: coronal st · coronal · 0.76mm/px · 3 of 82 slices shown]
[im 28/82  soft-tissue]
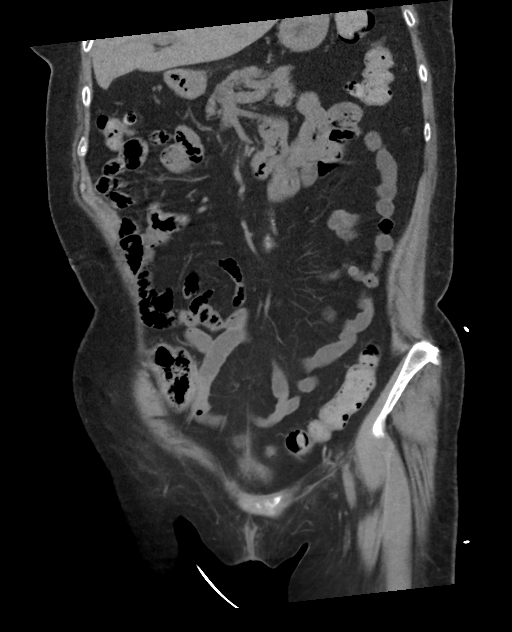
[im 37/82  soft-tissue]
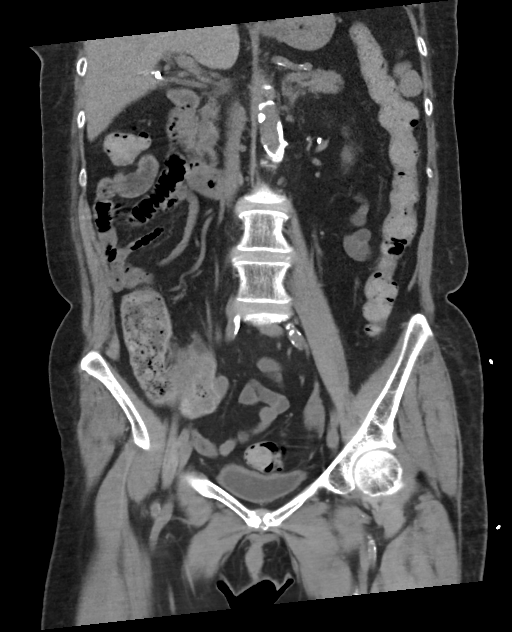
[im 46/82  soft-tissue]
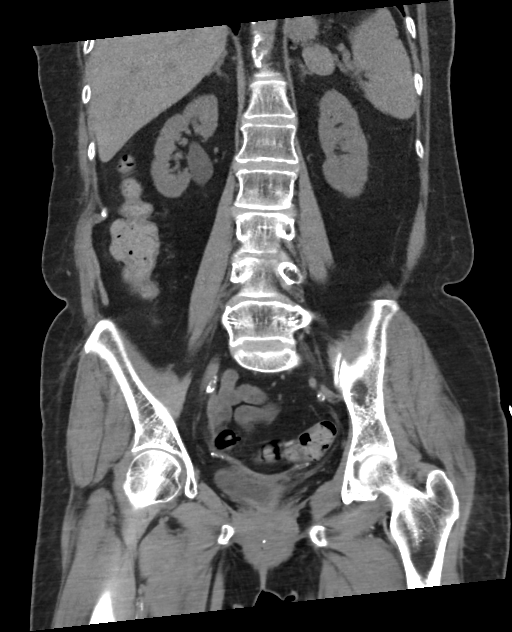

[16 of 46 positions shown; findings below may reference images not displayed]

FINDINGS: Lower chest: No acute findings.

Hepatobiliary: No mass visualized on this unenhanced exam. Stable
small cyst noted in inferior right lobe. Gallbladder is
unremarkable. No evidence of biliary ductal dilatation.

Pancreas: No mass or inflammatory process visualized on this
unenhanced exam.

Spleen:  Within normal limits in size.

Adrenals/Urinary tract: No evidence of urolithiasis or
hydronephrosis. Mild chronic bilateral renal parenchymal scarring
and caliectasis again noted. Unremarkable unopacified urinary
bladder.

Stomach/Bowel: No evidence of obstruction, inflammatory process, or
abnormal fluid collections.

Vascular/Lymphatic: No pathologically enlarged lymph nodes
identified. No evidence of abdominal aortic aneurysm.

Reproductive:  No mass or other significant abnormality.

Other:  None.

Musculoskeletal:  No suspicious bone lesions identified.
IMPRESSION: No evidence of urolithiasis, hydronephrosis, or other acute
findings.

Stable mild chronic bilateral renal parenchymal scarring.

## 2021-01-30 MED ORDER — ACETAMINOPHEN 325 MG PO TABS
650.0000 mg | ORAL_TABLET | Freq: Once | ORAL | Status: AC
  Administered 2021-01-30: 650 mg via ORAL
  Filled 2021-01-30: qty 2

## 2021-01-30 NOTE — Discharge Instructions (Addendum)
Your work-up today was overall reassuring.  Please make sure to keep your neurology appointment.  You may take Tylenol for headache.  Your COVID test is still pending, the results will be available on the MyChart app.  There are instructions on your discharge paperwork on how to download this.  Please follow-up with your primary care doctor as well.  Return to the ER for any new or worsening symptoms.

## 2021-01-30 NOTE — Telephone Encounter (Signed)
Appropriate. She might need IV hydration. Thanks.

## 2021-01-30 NOTE — ED Provider Notes (Signed)
Emergency Medicine Provider Triage Evaluation Note  Elizabeth Mcintyre , a 80 y.o. female  was evaluated in triage.  Patient has multiple complaints.  Pt complains of chest pain, nausea, vomiting, dizziness, headache, fatigue that started about 30 minutes ago.  Patient states that headache is intermittent, gradual onset.  No fevers.  No weakness or neglect.Primarily here for CP.   Review of Systems  Positive: Chest pain, dizziness, headache, fatigue, nausea, vomiting Negative: Fevers  Physical Exam  BP (!) 155/67 (BP Location: Right Arm)   Pulse 87   Temp 97.9 F (36.6 C) (Oral)   Resp 18   Ht 5\' 1"  (1.549 m)   Wt 59 kg   SpO2 99%   BMI 24.56 kg/m  Gen:   Awake, no distress   Resp:  Normal effort  MSK:   Moves extremities without difficulty  Neuro:  Alert. Clear speech. No facial droop. CNIII-XII grossly intact. Bilateral upper and lower extremities' sensation grossly intact. 5/5 symmetric strength with grip strength and with plantar and dorsi flexion bilaterally.Negative pronator drift.   Medical Decision Making  Medically screening exam initiated at 3:36 PM.  Appropriate orders placed.  ELDA DUNKERSON was informed that the remainder of the evaluation will be completed by another provider, this initial triage assessment does not replace that evaluation, and the importance of remaining in the ED until their evaluation is complete.    Alfredia Client, PA-C 01/30/21 1540    Daleen Bo, MD 02/01/21 [phone]

## 2021-01-30 NOTE — ED Provider Notes (Signed)
[facility] EMERGENCY DEPARTMENT Provider Note   CSN: [id number] Arrival date & time: 01/30/21  1526     History Chief Complaint  Patient presents with  . Dizziness    Elizabeth Mcintyre is a 80 y.o. female.  HPI 80 year old female with a history of non-Hodgkin's lymphoma, hypertension, GERD, DM type II, depression, anxiety, asthma presents to the ER with complaints of chest pain, headache, dizziness, fatigue, and 2 episodes of nonbloody nonbilious vomiting.  Onset was about 30 minutes ago.  No fevers or chills.  Denies any vision changes, blurred vision, syncope.  Patient is being followed by neurology as she has had chronic headaches.  Scheduled for an outpatient MRI.  She also endorses some generalized abdominal pain.  Denies any dysuria or hematuria    Past Medical History:  Diagnosis Date  . Asthma   . Bloating 04/26/2019  . 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 Cancer ([place]) 2021   Lymphoma  . 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 DIABETES MELLITUS, TYPE II 11/09/2007   diet control  . Diverticulosis 03/2011  . Fecal incontinence 07/07/2019  . GERD (gastroesophageal reflux disease)    none recently  . GI bleed   . Gout 06/09/2014   R great toe, ball of foot Stopped HCTZ several mos ago,  On losartan       . Hematemesis 03/30/2020  . History of rheumatoid arthritis    during 30's, was treated.  . Hyperlipidemia   . Hypertension   . IBS (irritable bowel syndrome)   . 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 Lower GI bleeding 04/30/2020  . Lymphadenopathy 01/23/2020  . Osteopenia 2017   Last  bone density 05/04/2017: -2.4  . PONV (postoperative nausea and vomiting)   . Schatzki's ring   . Stress incontinence   . Stroke [facility])    mini stroke - found on a CT scan    Patient Active Problem List   Diagnosis Date Noted  . Encounter to establish care 01/02/2021  . Restless legs syndrome 01/02/2021  . Prediabetes 08/14/2020   . Mixed hyperlipidemia 08/14/2020  . Active chemotherapy 04/30/2020  . Dysphagia 04/18/2020  . Port-A-Cath in place 03/28/2020  . 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 Hepatic steatosis 01/04/2020  . Splenomegaly 01/04/2020  . Thrombocytopenia ([place]) 01/04/2020  . Carotid artery stenosis 06/13/2016  . Stage 3a chronic kidney disease ([place]) 02/14/2016  . Vitamin D deficiency 05/25/2015  . Gastroesophageal reflux disease with esophagitis 03/06/2015  . Acquired hypothyroidism 05/30/2014  . Osteopenia 12/02/2013  . Major depression, recurrent, chronic ([place]) 04/03/2011  . Mild intermittent asthma 11/09/2007  . Essential hypertension, benign 11/09/2007    Past Surgical History:  Procedure Laterality Date  . ABDOMINAL HYSTERECTOMY    . BALLOON DILATION N/A 08/21/2020   Procedure: BALLOON DILATION;  Surgeon: Eloise Harman, DO;  Location: [facility];  Service: Endoscopy;  Laterality: N/A;  . BIOPSY  03/30/2020   Procedure: BIOPSY;  Surgeon: Ronald Lobo, MD;  Location: [facility] ENDOSCOPY;  Service: Endoscopy;;  . BIOPSY  05/02/2020   Procedure: BIOPSY;  Surgeon: Harvel Quale, MD;  Location: [facility] END[facility]ITE;  Service: Gastroenterology;;  . CARDIAC CATHETERIZATION     X 2, last one in 1998  . CHOLECYSTECTOMY N/A 04/13/2017   Procedure: LAPAROSCOPIC CHOLECYSTECTOMY;  Surgeon: Aviva Signs, MD;  Location: [facility] ORS;  Service: General;  Laterality: N/A;  . COLONOSCOPY    .  COLONOSCOPY  May 2012   Dr. Olevia Perches: mild diverticulosis, otherwise normal.   . COLONOSCOPY WITH PROPOFOL N/A 05/02/2020   Dr. Jenetta Downer: 8 mm polyp removed from the ascending colon, 2 mm polyp removed from the ascending colon.  Tubular adenomas.  Diverticulosis.  Mucosal ulceration in the sigmoid colon noted, pathology consistent with ischemic colitis.  Marland Kitchen ESOPHAGOGASTRODUODENOSCOPY N/A 01/28/2015   Dr. Gala Romney: reflux esophagitis, Schatzki's ring not manipulated due to recent bleeding  .  ESOPHAGOGASTRODUODENOSCOPY N/A 03/30/2015   Dr. Gala Romney: Schatzki's ring s/p Venia Minks dilation, previously noted esophageal ulcer completely healed  . ESOPHAGOGASTRODUODENOSCOPY N/A 03/30/2020   Buccini: Moderately severe erosive, circumferential, confluent esophagitis with no bleeding found 25 to 40 cm from incisors.  Nonobstructing and mild Schatzki ring, there were also multiple distal esophageal rings noted, minimal hiatal hernia.  Marland Kitchen ESOPHAGOGASTRODUODENOSCOPY (EGD) WITH PROPOFOL N/A 08/21/2020   Procedure: ESOPHAGOGASTRODUODENOSCOPY (EGD) WITH PROPOFOL;  Surgeon: Eloise Harman, DO;  [facility];  Service: Endoscopy;  Laterality: N/A;  2:15pm  . IR IMAGING GUIDED PORT INSERTION  03/13/2020  . LYMPH NODE BIOPSY Left 03/20/2020   Procedure: LEFT POSTERIOR CERVICAL LYMPH NODE BIOPSY;  Surgeon: Georganna Skeans, MD;  Location: [place];  Service: General;  Laterality: Left;  Marland Kitchen MALONEY DILATION N/A 03/30/2015   Procedure: Venia Minks DILATION;  Surgeon: Daneil Dolin, MD;  Location[facility];  Service: Endoscopy;  Laterality: N/A;  . POLYPECTOMY  05/02/2020   Procedure: POLYPECTOMY;  Surgeon: Harvel Quale, MD;  Location[facility];  Service: Gastroenterology;;     OB History   No obstetric history on file.     Family History  Problem Relation Age of Onset  . Heart disease Mother   . Osteoarthritis Mother   . Sudden death Father   . Single kidney Father   . Other Father        h/o severe MVA injuries  . Hyperlipidemia Sister   . Other Daughter        Myalgias  . Fibromyalgia Daughter   . Allergies Daughter   . Heart disease Maternal Grandfather   . Sudden death Paternal Grandmother   . Diabetes Paternal Grandfather   . Heart disease Daughter   . Other Daughter        palpitations  . Pulmonary fibrosis Maternal Aunt   . Cancer Paternal Uncle   . Pulmonary fibrosis Maternal Aunt   . Colon cancer Neg Hx     Social History   Tobacco Use  . Smoking status:  Never Smoker  . Smokeless tobacco: Never Used  Vaping Use  . Vaping Use: Never used  Substance Use Topics  . Alcohol use: No  . Drug use: No    Home Medications Prior to Admission medications   Medication Sig Start Date End Date Taking? Authorizing Provider  acetaminophen (TYLENOL) 325 MG tablet Take 325 mg by mouth every 6 (six) hours as needed for moderate pain.    Yes [provider]  amLODipine (NORVASC) 10 MG tablet Take 1 tablet (10 mg total) by mouth daily. 11/13/20  Yes Kuneff, Renee A, DO  Ascorbic Acid (VITAMIN C) 100 MG tablet Take 100 mg by mouth daily.   Yes [provider]  Cholecalciferol (VITAMIN D3) 25 MCG (1000 UT) CAPS Take 1,000 Units by mouth daily.    Yes [provider]  donepezil (ARICEPT) 10 MG tablet Take 1/2 tablet daily for 2 weeks, then increase to 1 tablet daily Patient taking differently: Take 10 mg  by mouth daily. 01/17/21  Yes Cameron Sprang, MD  levothyroxine (SYNTHROID) 88 MCG tablet Take 1 tablet (88 mcg total) by mouth daily. 11/14/20  Yes Kuneff, Renee A, DO  lidocaine-prilocaine (EMLA) cream Apply 1 application topically as needed. Patient taking differently: Apply 1 application topically as needed (port access). 04/12/20  Yes Orson Slick, MD  pantoprazole (PROTONIX) 40 MG tablet Take 1 tablet (40 mg total) by mouth daily. 11/15/20 11/15/21 Yes Carver, Elon Alas, DO  potassium chloride SA (KLOR-CON) 20 MEQ tablet Take 1 tablet (20 mEq total) by mouth daily. 07/13/20  Yes Orson Slick, MD  rOPINIRole (REQUIP) 0.25 MG tablet Take 1 tablet (0.25 mg total) by mouth at bedtime. 11/13/20  Yes Kuneff, Renee A, DO  rosuvastatin (CRESTOR) 20 MG tablet Take 1 tablet (20 mg total) by mouth daily. 11/14/20  Yes Kuneff, Renee A, DO  vitamin B-12 (CYANOCOBALAMIN) 1000 MCG tablet Take 1,000 mcg by mouth daily.    Yes [provider]  albuterol (VENTOLIN HFA) 108 (90 Base) MCG/ACT inhaler Inhale 2 puffs into the lungs every 6 (six)  hours as needed for wheezing. Patient not taking: Reported on 01/30/2021 08/14/20 08/14/21  Howard Pouch A, DO  cetirizine (ZYRTEC) 10 MG tablet Take 10 mg by mouth at bedtime. As needed Patient not taking: Reported on 01/30/2021    [provider]  sucralfate (CARAFATE) 1 g tablet Take 1 g by mouth 4 (four) times daily -  with meals and at bedtime. As needed Patient not taking: No sig reported    [provider]    Allergies    Codeine phosphate  Review of Systems   Review of Systems  Constitutional: Negative for chills and fever.  HENT: Negative for ear pain and sore throat.   Eyes: Negative for pain and visual disturbance.  Respiratory: Negative for cough and shortness of breath.   Cardiovascular: Positive for chest pain. Negative for palpitations.  Gastrointestinal: Positive for abdominal pain, nausea and vomiting.  Genitourinary: Negative for dysuria and hematuria.  Musculoskeletal: Negative for arthralgias and back pain.  Skin: Negative for color change and rash.  Neurological: Positive for headaches. Negative for seizures and syncope.  All other systems reviewed and are negative.   Physical Exam Updated Vital Signs BP (!) 135/56   Pulse 68   Temp 97.9 F (36.6 C) (Oral)   Resp 18   Ht 5\' 1"  (1.549 m)   Wt 59 kg   SpO2 99%   BMI 24.56 kg/m   Physical Exam Vitals reviewed.  Constitutional:      General: She is not in acute distress.    Appearance: Normal appearance. She is not ill-appearing, toxic-appearing or diaphoretic.  HENT:     Head: Normocephalic.     Mouth/Throat:     Mouth: Mucous membranes are moist.     Pharynx: Oropharynx is clear.  Eyes:     Conjunctiva/sclera: Conjunctivae normal.     Pupils: Pupils are equal, round, and reactive to light.  Cardiovascular:     Rate and Rhythm: Normal rate and regular rhythm.     Pulses: Normal pulses.     Heart sounds: Normal heart sounds, S1 normal and S2 normal. No murmur heard. No friction  rub.  Pulmonary:     Effort: Pulmonary effort is normal.     Breath sounds: Normal breath sounds.  Abdominal:     General: Abdomen is flat.     Palpations: Abdomen is soft.  Tenderness: There is abdominal tenderness.     Comments: Generalized nonfocal abdominal tenderness with some guarding.  Musculoskeletal:        General: Normal range of motion.     Right lower leg: No edema.     Left lower leg: No edema.  Skin:    General: Skin is warm and dry.  Neurological:     General: No focal deficit present.     Mental Status: She is alert and oriented to person, place, and time.     Comments: Mental Status:  Alert, thought content appropriate, able to give a coherent history. Speech fluent without evidence of aphasia. Able to follow 2 step commands without difficulty.  Cranial Nerves:  II: Peripheral visual fields grossly normal, pupils equal, round, reactive to light III,IV, VI: ptosis not present, extra-ocular motions intact bilaterally  V,VII: smile symmetric, facial light touch sensation equal VIII: hearing grossly normal to voice  X: uvula elevates symmetrically  XI: bilateral shoulder shrug symmetric and strong XII: midline tongue extension without fassiculations Motor:  Normal tone. 5/5 strength of BUE and BLE major muscle groups including strong and equal grip strength and dorsiflexion/plantar flexion Sensory: light touch normal in all extremities. Cerebellar: normal finger-to-nose with bilateral upper extremities, Romberg sign absent Gait: not accessed  Psychiatric:        Mood and Affect: Mood normal.        Behavior: Behavior normal.     ED Results / Procedures / Treatments   Labs (all labs ordered are listed, but only abnormal results are displayed) Labs Reviewed  BASIC METABOLIC PANEL - Abnormal; Notable for the following components:      Result Value   Glucose, Bld 106 (*)    BUN 24 (*)    Creatinine, Ser 1.36 (*)    GFR, Estimated 40 (*)    All other  components within normal limits  URINALYSIS, ROUTINE W REFLEX MICROSCOPIC - Abnormal; Notable for the following components:   Color, Urine STRAW (*)    Ketones, ur 5 (*)    All other components within normal limits  SARS CORONAVIRUS 2 (TAT 6-24 HRS)  CBC  BRAIN NATRIURETIC PEPTIDE  HEPATIC FUNCTION PANEL  TROPONIN I (HIGH SENSITIVITY)  TROPONIN I (HIGH SENSITIVITY)    EKG EKG Interpretation  Date/Time:  Wednesday Jan 30 2021 15:41:29 EDT Ventricular Rate:  81 PR Interval:  158 QRS Duration: 88 QT Interval:  398 QTC Calculation: 462 R Axis:   -10 Text Interpretation: Normal sinus rhythm Anterior infarct , age undetermined Abnormal ECG since last tracing no significant change Confirmed by Daleen Bo [id number]) on 01/30/2021 4:04:47 PM   Radiology DG Chest 2 View  Result Date: 01/30/2021 CLINICAL DATA:  Chest pain EXAM: CHEST - 2 VIEW COMPARISON:  03/30/2020 FINDINGS: Right-sided central venous port tip over the cavoatrial region. No focal opacity or pleural effusion. Normal cardiomediastinal silhouette. No pneumothorax. Aortic atherosclerosis. IMPRESSION: No active cardiopulmonary disease. Electronically Signed   By: Donavan Foil M.D.   On: 01/30/2021 16:48   CT Head Wo Contrast  Result Date: 01/30/2021 CLINICAL DATA:  Headaches for 3 weeks.  Nausea. EXAM: CT HEAD WITHOUT CONTRAST TECHNIQUE: Contiguous axial images were obtained from the base of the skull through the vertex without intravenous contrast. COMPARISON:  Head MRI 02/08/2020 FINDINGS: Brain: There is no evidence of an acute infarct, intracranial hemorrhage, mass, midline shift, or extra-axial fluid collection. 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 Hypodensities in the cerebral white matter bilaterally are nonspecific but  compatible with mild-to-moderate chronic small vessel ischemic disease. Mild cerebral atrophy is unchanged. Vascular: Calcified atherosclerosis at the skull base. No hyperdense vessel.  Skull: No fracture or suspicious osseous lesion. Sinuses/Orbits: Visualized paranasal sinuses and mastoid air cells are clear. Visualized orbits are unremarkable. Other: None. IMPRESSION: 1. No evidence of acute intracranial abnormality. 2. Mild-to-moderate chronic small vessel ischemic disease. Electronically Signed   By: Logan Bores M.D.   On: 01/30/2021 19:39    Procedures Procedures   Medications Ordered in ED Medications  acetaminophen (TYLENOL) tablet 650 mg (650 mg Oral Given 01/30/21 1933)    ED Course  I have reviewed the triage vital signs and the nursing notes.  Pertinent labs & imaging results that were available during my care of the patient were reviewed by me and considered in my medical decision making (see chart for details).    MDM Rules/Calculators/A&P                          80 year old female presents to the ER with headache, nausea, nonbloody nonbilious vomiting, chest pain.  Arrival, well-appearing, no acute distress, resting comfortably in the ER bed.  No focal neurodeficits on exam.  She has some generalized abdominal tenderness with some guarding.  Vitals on arrival with slight hypertension, afebrile, not tachycardic, tachypneic or hypoxic.  She has a history of non-Hodgkin's lymphoma, has outpatient MRI pending.    DDx includes COVID-19, metastasis to the brain, GERD, surgical abdomen, subarachnoid hemorrhage, ICH, viral infection, UTI  Labs and imaging ordered, reviewed and interpreted by me.  CBC unremarkable.  BMP with a slightly elevated creatinine of 1.36, slightly increased from 1.272 weeks ago.  BNP normal.  Delta troponins negative.  UA without evidence of UTI.  CT of the head without any acute intracranial normalities.  Again low suspicion for stroke.  Chest x-ray.  Chest x-ray without any evidence of pneumonia.  CT of the abdomen without any evidence of surgical abdomen.  Hepatic function panel normal.  Overall work-up reassuring.  Question possible  migraine though the patient denies any history of this.  She is followed by neurology and has plans for an outpatient MRI.  No signs of stroke, subarachnoid hemorrhage, low suspicion for pseudotumor cerebri.  Patient and daughter at bedside informed of the reassuring work-up.  Patient reports improvement in headache after Tylenol.  Encouraged PCP follow-up.  Discussed return precautions.  Stable for discharge.  This was a shared visit with my supervising physician Dr. Eulis Foster who independently saw and evaluated the patient & provided guidance in evaluation/management/disposition ,in agreement with care   Final Clinical Impression(s) / ED Diagnoses Final diagnoses:  Acute nonintractable headache, unspecified headache type  Chest pain, unspecified type    Rx / DC Orders ED Discharge Orders    None       Lyndel Safe 01/30/21 2122    Daleen Bo, MD 02/01/21 [phone]

## 2021-01-30 NOTE — Telephone Encounter (Signed)
Elizabeth Mcintyre called and states her and pt have been out running errands and pt's feet and legs are swelling and warm and she is having a severe headache, she felt flush and light headed and started to get nauseated. I advised pt to be evaluated at ER.

## 2021-01-30 NOTE — ED Triage Notes (Signed)
Pt to er, pt states that she is here for headache, dizziness, general body aches, and fatigue.  Daughter with pt states that she was having some chest tightness in the car on the way here.

## 2021-01-30 NOTE — ED Notes (Signed)
Patient transported to CT 

## 2021-01-31 LAB — SARS CORONAVIRUS 2 (TAT 6-24 HRS): SARS Coronavirus 2: NEGATIVE

## 2021-02-12 ENCOUNTER — Encounter: Payer: Self-pay | Admitting: Hematology and Oncology

## 2021-02-12 ENCOUNTER — Ambulatory Visit: Payer: Medicare Other

## 2021-02-12 ENCOUNTER — Other Ambulatory Visit: Payer: Self-pay

## 2021-02-12 ENCOUNTER — Ambulatory Visit (INDEPENDENT_AMBULATORY_CARE_PROVIDER_SITE_OTHER): Payer: Medicare Other | Admitting: Internal Medicine

## 2021-02-12 ENCOUNTER — Encounter: Payer: Self-pay | Admitting: Internal Medicine

## 2021-02-12 ENCOUNTER — Ambulatory Visit
Admission: RE | Admit: 2021-02-12 | Discharge: 2021-02-12 | Disposition: A | Discharge status: Home / Self Care | Payer: Medicare Other | Source: Ambulatory Visit | Attending: Neurology | Admitting: Neurology

## 2021-02-12 VITALS — BP 129/69 | HR 92 | Temp 97.8°F | Resp 18 | Ht 62.0 in | Wt 127.4 lb

## 2021-02-12 DIAGNOSIS — I10 Essential (primary) hypertension: Secondary | ICD-10-CM

## 2021-02-12 DIAGNOSIS — M2548 Effusion, other site: Secondary | ICD-10-CM | POA: Diagnosis not present

## 2021-02-12 DIAGNOSIS — R197 Diarrhea, unspecified: Secondary | ICD-10-CM | POA: Diagnosis not present

## 2021-02-12 DIAGNOSIS — N1831 Chronic kidney disease, stage 3a: Secondary | ICD-10-CM | POA: Diagnosis not present

## 2021-02-12 DIAGNOSIS — G2581 Restless legs syndrome: Secondary | ICD-10-CM

## 2021-02-12 DIAGNOSIS — F0390 Unspecified dementia without behavioral disturbance: Secondary | ICD-10-CM

## 2021-02-12 DIAGNOSIS — E039 Hypothyroidism, unspecified: Secondary | ICD-10-CM | POA: Diagnosis not present

## 2021-02-12 DIAGNOSIS — F03A0 Unspecified dementia, mild, without behavioral disturbance, psychotic disturbance, mood disturbance, and anxiety: Secondary | ICD-10-CM

## 2021-02-12 DIAGNOSIS — R519 Headache, unspecified: Secondary | ICD-10-CM

## 2021-02-12 DIAGNOSIS — I639 Cerebral infarction, unspecified: Secondary | ICD-10-CM | POA: Diagnosis not present

## 2021-02-12 DIAGNOSIS — R413 Other amnesia: Secondary | ICD-10-CM | POA: Diagnosis not present

## 2021-02-12 LAB — HM DIABETES EYE EXAM

## 2021-02-12 IMAGING — MR MR HEAD WO/W CM
13 series · 48 of 48 positions shown · IV contrast (multihance)
Comparison: Head CT [DATE]

CLINICAL DATA: Memory loss.

EXAM:
MRI HEAD WITHOUT AND WITH CONTRAST
TECHNIQUE: Multiplanar, multiecho pulse sequences of the brain and surrounding
structures were obtained without and with intravenous contrast.
CONTRAST:  12mL MULTIHANCE GADOBENATE DIMEGLUMINE 529 MG/ML IV SOLN

[Series 2: T1 · sagittal · 5.0mm · 0.45mm/px · 1 of 21 slices shown]
[im 1/21]
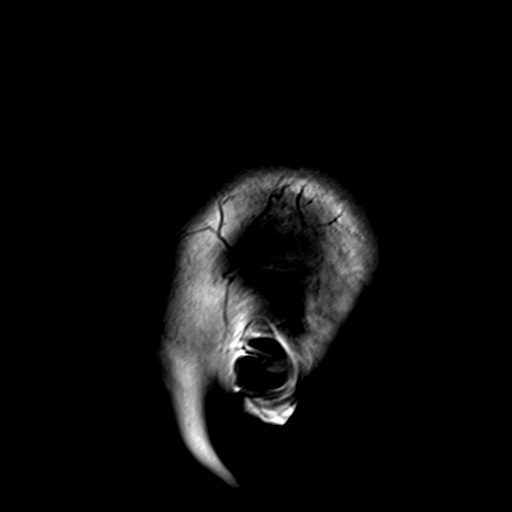

[Series 3: ax ep2d_diff_3 · axial · 3.0mm · 1.80mm/px · z∈[-46,+104]mm · 5 of 101 slices shown]
[im 1/101]
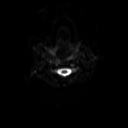
[im 26/101]
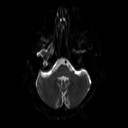
[im 51/101]
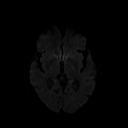
[im 76/101]
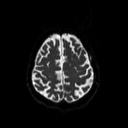
[im 101/101]
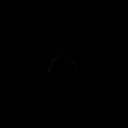

[Series 4: ax ep2d_diff_3_adc · axial · 3.0mm · 1.80mm/px · z∈[-46,+104]mm · 3 of 50 slices shown]
[im 1/50]
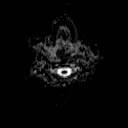
[im 25/50]
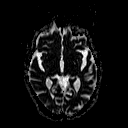
[im 50/50]
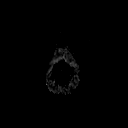

[Series 5: cor ep2d_diff · coronal · 5.0mm · 1.77mm/px · 3 of 56 slices shown]
[im 1/56]
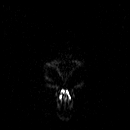
[im 28/56]
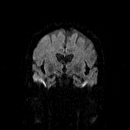
[im 56/56]
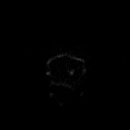

[Series 6: cor ep2d_diff_adc · coronal · 5.0mm · 1.77mm/px · 2 of 28 slices shown]
[im 1/28]
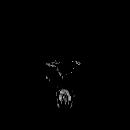
[im 28/28]
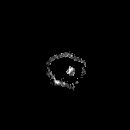

[Series 8: swi_images · axial · 2.0mm · 0.98mm/px · z∈[-51,+107]mm · 5 of 80 slices shown]
[im 1/80]
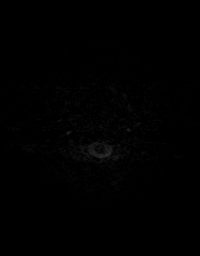
[im 20/80]
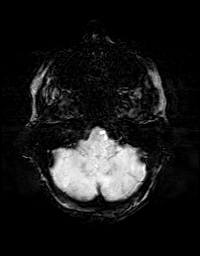
[im 40/80]
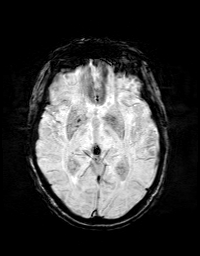
[im 60/80]
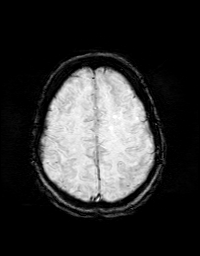
[im 80/80]
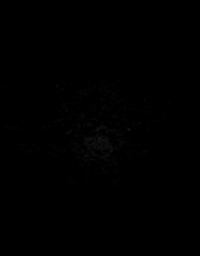

[Series 9: FLAIR · axial · 3.0mm · 0.43mm/px · z∈[-47,+105]mm · 2 of 40 slices shown]
[im 1/40]
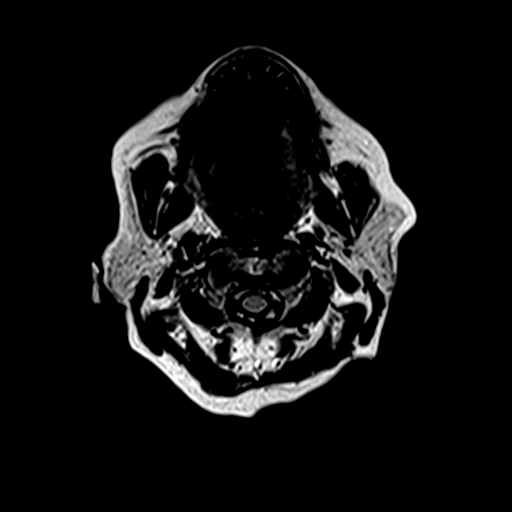
[im 40/40]
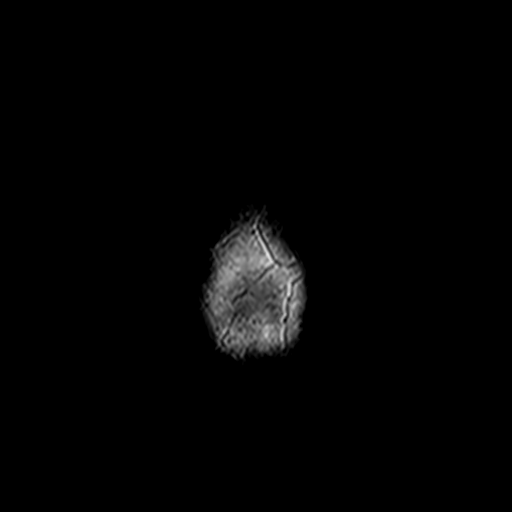

[Series 10: T2 · axial · 5.0mm · 0.65mm/px · z∈[-56,+112]mm · 2 of 29 slices shown (1 of 2)]
[im 1/29]
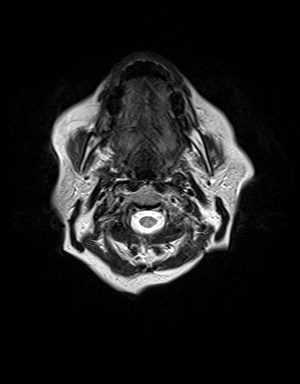
[im 29/29]
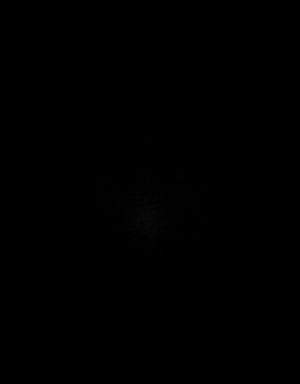

[Series 11: t1_mpr_tra · axial · 1.0mm · 0.72mm/px · z∈[-51,+108]mm · 10 of 160 slices shown]
[im 1/160]
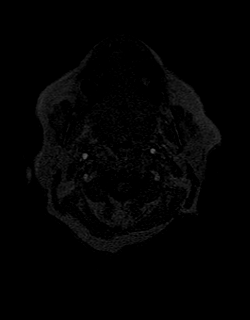
[im 18/160]
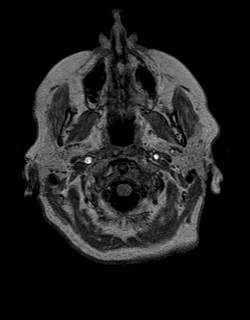
[im 36/160]
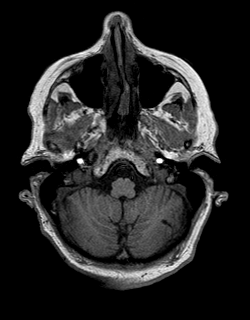
[im 54/160]
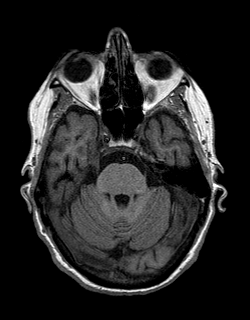
[im 71/160]
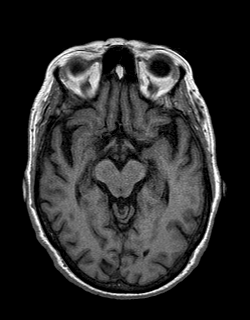
[im 89/160]
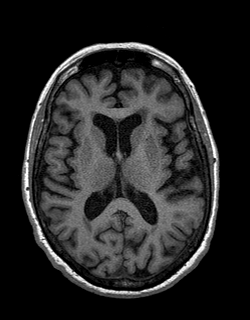
[im 107/160]
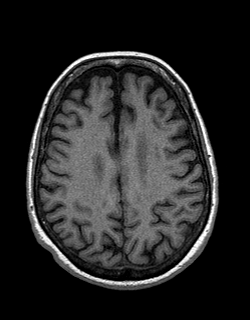
[im 124/160]
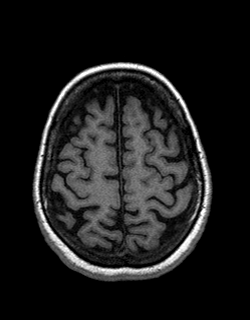
[im 142/160]
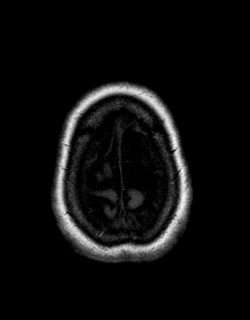
[im 160/160]
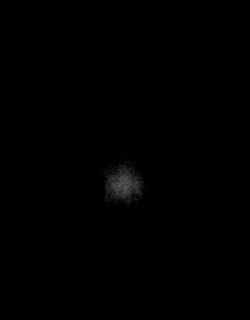

[Series 12: T2 · coronal · 5.0mm · 0.45mm/px · 2 of 27 slices shown (2 of 2)]
[im 1/27]
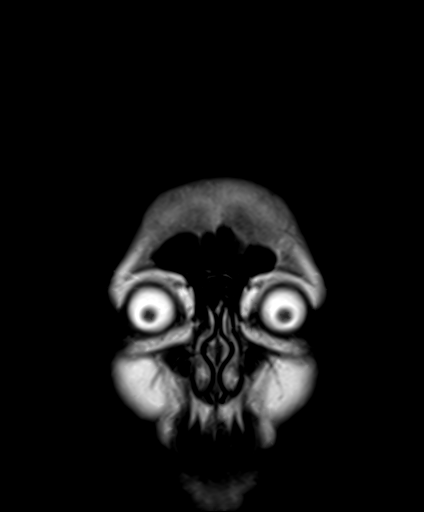
[im 27/27]
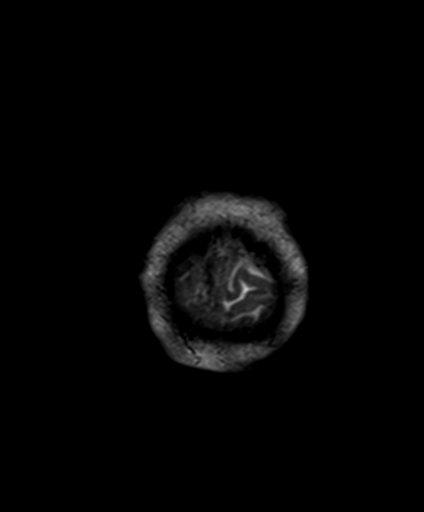

[Series 13: T1 post-contrast · coronal · 5.0mm · 0.72mm/px · 2 of 26 slices shown]
[im 1/26]
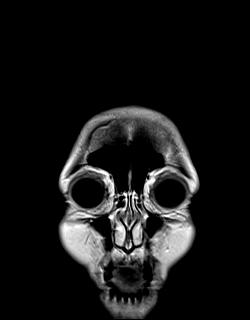
[im 26/26]
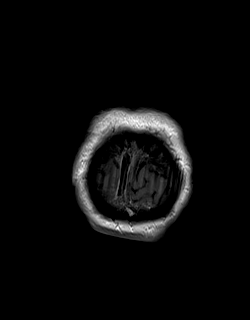

[Series 14: post t1_mpr_tra · axial · 1.0mm · 0.72mm/px · z∈[-51,+108]mm · 10 of 160 slices shown]
[im 1/160]
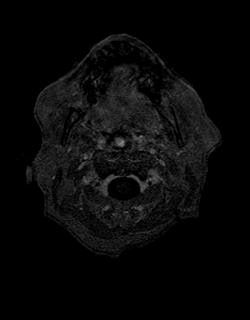
[im 18/160]
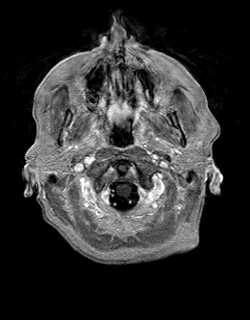
[im 36/160]
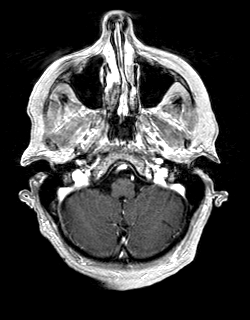
[im 54/160]
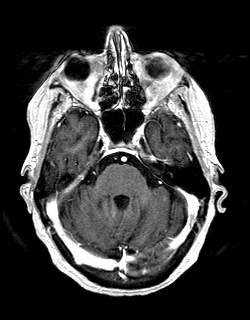
[im 71/160]
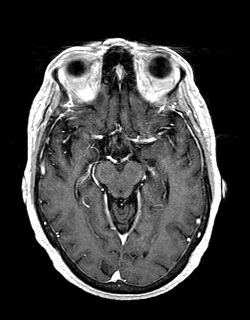
[im 89/160]
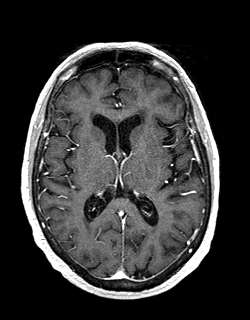
[im 107/160]
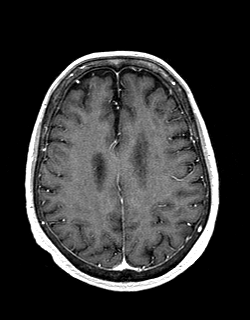
[im 124/160]
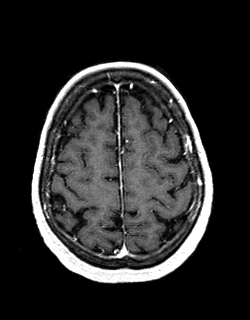
[im 142/160]
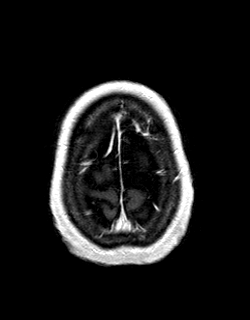
[im 160/160]
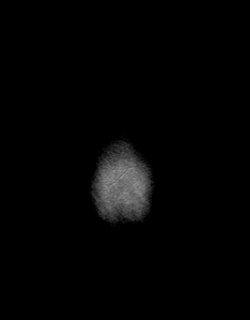

[Series 15: t1_se_sag post · sagittal · 5.0mm · 0.45mm/px · 1 of 21 slices shown]
[im 1/21]
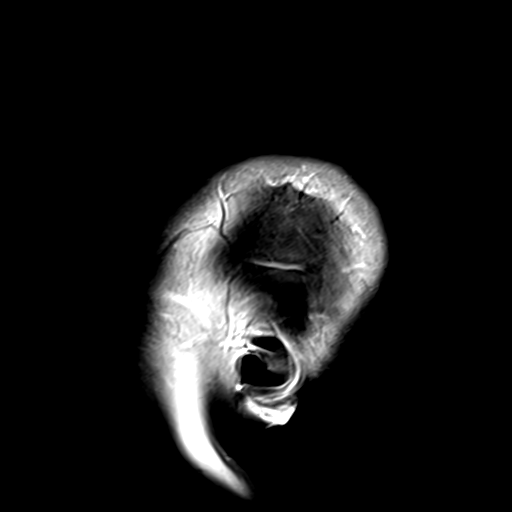

[48 of 48 positions shown; findings below may reference images not displayed]

FINDINGS: Brain: No acute infarction, hemorrhage, hydrocephalus, extra-axial
collection or mass lesion. Remote lacunar infarcts in the bilateral
cerebellar hemispheres. Scattered foci of T2 hyperintensity are seen
within the white matter of the cerebral hemispheres, nonspecific,
most likely related to chronic small vessel ischemia. Mild
parenchymal volume loss with moderate prominence of the temporal
horn of the right lateral ventricle. No focus of abnormal contrast
enhancement.

Vascular: Normal flow voids.

Skull and upper cervical spine: Normal marrow signal.

Sinuses/Orbits: Negative.

Other: Right mastoid effusion.
IMPRESSION: 1. No acute intracranial abnormality.
2. Mild chronic microangiopathic changes of the white matter and
parenchymal volume loss. Moderate prominence of the temporal horn of
the right lateral ventricle suggesting asymmetric volume loss of the
right hippocampus.

## 2021-02-12 MED ORDER — CIPROFLOXACIN HCL 500 MG PO TABS: 500.0000 mg | ORAL_TABLET | Freq: Two times a day (BID) | ORAL | 0 refills | Status: DC

## 2021-02-12 MED ORDER — GADOBENATE DIMEGLUMINE 529 MG/ML IV SOLN
12.0000 mL | Freq: Once | INTRAVENOUS | Status: AC | PRN
  Administered 2021-02-12: 12 mL via INTRAVENOUS

## 2021-02-12 NOTE — Patient Instructions (Addendum)
Please take at least 64 ounces of fluid in a day and additional 250-300 for each episode of diarrhea.  Please continue to take medications as prescribed.

## 2021-02-13 ENCOUNTER — Other Ambulatory Visit: Payer: Self-pay | Admitting: Internal Medicine

## 2021-02-13 ENCOUNTER — Telehealth: Payer: Self-pay

## 2021-02-13 ENCOUNTER — Encounter: Payer: Self-pay | Admitting: Hematology and Oncology

## 2021-02-13 DIAGNOSIS — E039 Hypothyroidism, unspecified: Secondary | ICD-10-CM

## 2021-02-13 LAB — BASIC METABOLIC PANEL
BUN/Creatinine Ratio: 11 — ABNORMAL LOW (ref 12–28)
BUN: 15 mg/dL (ref 8–27)
CO2: 22 mmol/L (ref 20–29)
Calcium: 10 mg/dL (ref 8.7–10.3)
Chloride: 99 mmol/L (ref 96–106)
Creatinine, Ser: 1.34 mg/dL — ABNORMAL HIGH (ref 0.57–1.00)
Glucose: 137 mg/dL — ABNORMAL HIGH (ref 65–99)
Potassium: 4.4 mmol/L (ref 3.5–5.2)
Sodium: 140 mmol/L (ref 134–144)
eGFR: 41 mL/min/{1.73_m2} — ABNORMAL LOW (ref >59)

## 2021-02-13 LAB — TSH+FREE T4
Free T4: 2.29 ng/dL — ABNORMAL HIGH (ref 0.82–1.77)
TSH: 0.057 u[IU]/mL — ABNORMAL LOW (ref 0.450–4.500)

## 2021-02-13 MED ORDER — LEVOTHYROXINE SODIUM 75 MCG PO TABS: 75.0000 ug | ORAL_TABLET | Freq: Every day | ORAL | 3 refills | Status: DC

## 2021-02-13 NOTE — Telephone Encounter (Signed)
Informed her of lab results  

## 2021-02-13 NOTE — Telephone Encounter (Signed)
Elizabeth Mcintyre returning call about lab results. Call back # [phone]

## 2021-02-15 DIAGNOSIS — R197 Diarrhea, unspecified: Secondary | ICD-10-CM | POA: Diagnosis not present

## 2021-02-15 NOTE — Assessment & Plan Note (Signed)
Reports watery diarrhea for last few days Unclear etiology at the time of office visit - check GI profile  Addendum: TSH low, oversupplementation of thyroid hormone could be a reason for diarrhea. Decreased dose of Levothyroxine.

## 2021-02-15 NOTE — Assessment & Plan Note (Signed)
Lab Results  Component Value Date   TSH 0.057 (L) 02/12/2021   On Levothyroxine 88 mcg QD, decreased to 75 mcg after reviewing TSH - could be contributing to diarrhea Will check TSH and free T4 in the next visit

## 2021-02-15 NOTE — Assessment & Plan Note (Signed)
BP Readings from Last 1 Encounters:  02/12/21 129/69   Well-controlled with Amlodipine 10 mg QD Counseled for compliance with the medications Advised DASH diet and moderate exercise/walking, at least 150 mins/week

## 2021-02-15 NOTE — Assessment & Plan Note (Signed)
Continue Ropinirole.

## 2021-02-15 NOTE — Progress Notes (Signed)
Established Patient Office Visit  Subjective:  Patient ID: Elizabeth Mcintyre, female    DOB: June 26, 1941  Age: 80 y.o. MRN: [id number]  CC:  Chief Complaint  Patient presents with  . 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  She has been having watery diarrhea for last few days. Her daughter states that her PO intake of fluids is not adequate.  HTN: BP is well-controlled. Takes medications regularly. Patient denies headache, dizziness, chest pain, dyspnea or palpitations.  Hypothyroidism: Takes Levothyroxine regularly. Denies any tremors or palpitations.  She takes Ropinirole for restless legs syndrome.  Past Medical History:  Diagnosis Date  . Asthma   . Bloating 04/26/2019  . 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 Cancer ([place]) 2021   Lymphoma  . 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 DIABETES MELLITUS, TYPE II 11/09/2007   diet control  . Diverticulosis 03/2011  . Fecal incontinence 07/07/2019  . GERD (gastroesophageal reflux disease)    none recently  . GI bleed   . Gout 06/09/2014   R great toe, ball of foot Stopped HCTZ several mos ago,  On losartan       . Hematemesis 03/30/2020  . History of rheumatoid arthritis    during 30's, was treated.  . Hyperlipidemia   . Hypertension   . IBS (irritable bowel syndrome)   . 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 Lower GI bleeding 04/30/2020  . Lymphadenopathy 01/23/2020  . Osteopenia 2017   Last  bone density 05/04/2017: -2.4  . PONV (postoperative nausea and vomiting)   . Schatzki's ring   . Stress incontinence   .  Stroke [facility])    mini stroke - found on a CT scan    Past Surgical History:  Procedure Laterality Date  . ABDOMINAL HYSTERECTOMY    . BALLOON DILATION N/A 08/21/2020   Procedure: BALLOON DILATION;  Surgeon: Eloise Harman, DO;  [facility];  Service: Endoscopy;  Laterality: N/A;  . BIOPSY  03/30/2020   Procedure: BIOPSY;  Surgeon: Ronald Lobo, MD;  Location: [facility];  Service: Endoscopy;;  . BIOPSY  05/02/2020   Procedure: BIOPSY;  Surgeon: Harvel Quale, MD;  Location: [facility];  Service: Gastroenterology;;  . CARDIAC CATHETERIZATION     X 2, last one in 1998  . CHOLECYSTECTOMY N/A 04/13/2017   Procedure: LAPAROSCOPIC CHOLECYSTECTOMY;  Surgeon: Aviva Signs, MD;  Location: [facility];  Service: General;  Laterality: N/A;  . COLONOSCOPY    . COLONOSCOPY  May 2012   Dr. Olevia Perches: mild diverticulosis, otherwise normal.   . COLONOSCOPY WITH PROPOFOL N/A 05/02/2020   Dr. Jenetta Downer: 8 mm polyp removed from the ascending colon, 2 mm polyp removed from the ascending colon.  Tubular adenomas.  Diverticulosis.  Mucosal ulceration in the sigmoid colon noted, pathology consistent with ischemic colitis.  Marland Kitchen ESOPHAGOGASTRODUODENOSCOPY N/A 01/28/2015   Dr. Gala Romney: reflux esophagitis, Schatzki's ring not manipulated due to recent bleeding  . ESOPHAGOGASTRODUODENOSCOPY N/A 03/30/2015   Dr. Gala Romney: Schatzki's ring s/p Venia Minks dilation, previously noted esophageal ulcer completely healed  .  ESOPHAGOGASTRODUODENOSCOPY N/A 03/30/2020   Buccini: Moderately severe erosive, circumferential, confluent esophagitis with no bleeding found 25 to 40 cm from incisors.  Nonobstructing and mild Schatzki ring, there were also multiple distal esophageal rings noted, minimal hiatal hernia.  Marland Kitchen ESOPHAGOGASTRODUODENOSCOPY (EGD) WITH PROPOFOL N/A 08/21/2020   Procedure: ESOPHAGOGASTRODUODENOSCOPY (EGD) WITH PROPOFOL;  Surgeon: Eloise Harman, DO;  [facility];  Service: Endoscopy;   Laterality: N/A;  2:15pm  . IR IMAGING GUIDED PORT INSERTION  03/13/2020  . LYMPH NODE BIOPSY Left 03/20/2020   Procedure: LEFT POSTERIOR CERVICAL LYMPH NODE BIOPSY;  Surgeon: Georganna Skeans, MD;  Location: [place];  Service: General;  Laterality: Left;  Marland Kitchen MALONEY DILATION N/A 03/30/2015   Procedure: Venia Minks DILATION;  Surgeon: Daneil Dolin, MD;  [facility];  Service: Endoscopy;  Laterality: N/A;  . POLYPECTOMY  05/02/2020   Procedure: POLYPECTOMY;  Surgeon: Harvel Quale, MD;  [facility];  Service: Gastroenterology;;    Family History  Problem Relation Age of Onset  . Heart disease Mother   . Osteoarthritis Mother   . Sudden death Father   . Single kidney Father   . Other Father        h/o severe MVA injuries  . Hyperlipidemia Sister   . Other Daughter        Myalgias  . Fibromyalgia Daughter   . Allergies Daughter   . Heart disease Maternal Grandfather   . Sudden death Paternal Grandmother   . Diabetes Paternal Grandfather   . Heart disease Daughter   . Other Daughter        palpitations  . Pulmonary fibrosis Maternal Aunt   . Cancer Paternal Uncle   . Pulmonary fibrosis Maternal Aunt   . Colon cancer Neg Hx     Social History   Socioeconomic History  . Marital status: Widowed    Spouse name: Not on file  . Number of children: 2  . Years of education: Not on file  . Highest education level: Not on file  Occupational History  . Occupation: retired  Tobacco Use  . Smoking status: Never Smoker  . Smokeless tobacco: Never Used  Vaping Use  . Vaping Use: Never used  Substance and Sexual Activity  . Alcohol use: No  . Drug use: No  . Sexual activity: Never  Other Topics Concern  . Not on file  Social History Narrative   Ms. Rogoff is widowed. Her young grandson lives with her, for whom she shares custody with her daughter, the son's aunt.     Right handed    Social Determinants of Health   Financial Resource Strain: Not on  file  Food Insecurity: Not on file  Transportation Needs: Not on file  Physical Activity: Not on file  Stress: Not on file  Social Connections: Not on file  Intimate Partner Violence: Not on file    Outpatient Medications Prior to Visit  Medication Sig Dispense Refill  . acetaminophen (TYLENOL) 325 MG tablet Take 325 mg by mouth every 6 (six) hours as needed for moderate pain.     Marland Kitchen albuterol (VENTOLIN HFA) 108 (90 Base) MCG/ACT inhaler Inhale 2 puffs into the lungs every 6 (six) hours as needed for wheezing. 8.5 g 2  . amLODipine (NORVASC) 10 MG tablet Take 1 tablet (10 mg total) by mouth daily. 90 tablet 1  . Ascorbic Acid (VITAMIN C) 100 MG tablet Take 100 mg by mouth daily.    . cetirizine (ZYRTEC) 10  MG tablet Take 10 mg by mouth at bedtime. As needed    . Cholecalciferol (VITAMIN D3) 25 MCG (1000 UT) CAPS Take 1,000 Units by mouth daily.     Marland Kitchen donepezil (ARICEPT) 10 MG tablet Take 1/2 tablet daily for 2 weeks, then increase to 1 tablet daily (Patient taking differently: Take 10 mg by mouth daily.) 30 tablet 11  . lidocaine-prilocaine (EMLA) cream Apply 1 application topically as needed. (Patient taking differently: Apply 1 application topically as needed (port access).) 30 g 1  . pantoprazole (PROTONIX) 40 MG tablet Take 1 tablet (40 mg total) by mouth daily. 90 tablet 3  . potassium chloride SA (KLOR-CON) 20 MEQ tablet Take 1 tablet (20 mEq total) by mouth daily. 21 tablet 0  . rOPINIRole (REQUIP) 0.25 MG tablet Take 1 tablet (0.25 mg total) by mouth at bedtime. 90 tablet 1  . rosuvastatin (CRESTOR) 20 MG tablet Take 1 tablet (20 mg total) by mouth daily. 90 tablet 3  . sucralfate (CARAFATE) 1 g tablet Take 1 g by mouth 4 (four) times daily -  with meals and at bedtime. As needed    . vitamin B-12 (CYANOCOBALAMIN) 1000 MCG tablet Take 1,000 mcg by mouth daily.     Marland Kitchen levothyroxine (SYNTHROID) 88 MCG tablet Take 1 tablet (88 mcg total) by mouth daily. 90 tablet 4   No  facility-administered medications prior to visit.    Allergies  Allergen Reactions  . Codeine Phosphate Nausea And Vomiting and Rash    ROS Review of Systems  Constitutional: Negative for chills and fever.  HENT: Negative for congestion, sinus pressure, sinus pain and sore throat.   Eyes: Positive for visual disturbance. Negative for pain and discharge.  Respiratory: Negative for cough and shortness of breath.   Cardiovascular: Negative for chest pain and palpitations.  Gastrointestinal: Positive for diarrhea. Negative for abdominal pain, constipation, nausea and vomiting.  Endocrine: Negative for polydipsia and polyuria.  Genitourinary: Negative for dysuria and hematuria.  Musculoskeletal: Negative for neck pain and neck stiffness.  Skin: Negative for rash.  Neurological: Positive for headaches. Negative for dizziness and weakness.  Psychiatric/Behavioral: Negative for agitation and behavioral problems.      Objective:    Physical Exam Vitals reviewed.  Constitutional:      General: She is not in acute distress.    Appearance: She is not diaphoretic.  HENT:     Head: Normocephalic and atraumatic.     Nose: Nose normal.     Mouth/Throat:     Mouth: Mucous membranes are moist.  Eyes:     General: No scleral icterus.    Extraocular Movements: Extraocular movements intact.     Pupils: Pupils are equal, round, and reactive to light.  Cardiovascular:     Rate and Rhythm: Normal rate and regular rhythm.     Pulses: Normal pulses.     Heart sounds: Normal heart sounds. No murmur heard.   Pulmonary:     Breath sounds: Normal breath sounds. No wheezing or rales.  Abdominal:     Palpations: Abdomen is soft.     Tenderness: There is no abdominal tenderness. There is no guarding or rebound.  Musculoskeletal:     Cervical back: Neck supple. No tenderness.     Right lower leg: No edema.     Left lower leg: No edema.  Skin:    General: Skin is warm.     Findings: No rash.   Neurological:     General: No focal deficit  present.     Mental Status: She is alert and oriented to person, place, and time.  Psychiatric:        Mood and Affect: Mood normal.        Behavior: Behavior normal.     BP 129/69 (BP Location: Left Arm, Patient Position: Sitting, Cuff Size: Normal)   Pulse 92   Temp 97.8 F (36.6 C) (Oral)   Resp 18   Ht 5' 2" (1.575 m)   Wt 127 lb 6.4 oz (57.8 kg)   SpO2 99%   BMI 23.30 kg/m  Wt Readings from Last 3 Encounters:  02/12/21 127 lb 6.4 oz (57.8 kg)  01/30/21 130 lb (59 kg)  01/17/21 133 lb (60.3 kg)     Health Maintenance Due  Topic Date Due  . Pneumococcal Vaccine 42-21 Years old (1 of 4 - PCV13) Never done  . Zoster Vaccines- Shingrix (1 of 2) Never done  . OPHTHALMOLOGY EXAM  08/29/2018  . MAMMOGRAM  02/01/2021    There are no preventive care reminders to display for this patient.  Lab Results  Component Value Date   TSH 0.057 (L) 02/12/2021   Lab Results  Component Value Date   WBC 6.9 01/30/2021   HGB 12.8 01/30/2021   HCT 37.5 01/30/2021   MCV 87.8 01/30/2021   PLT 186 01/30/2021   Lab Results  Component Value Date   NA 140 02/12/2021   K 4.4 02/12/2021   CO2 22 02/12/2021   GLUCOSE 137 (H) 02/12/2021   BUN 15 02/12/2021   CREATININE 1.34 (H) 02/12/2021   BILITOT 0.6 01/30/2021   ALKPHOS 76 01/30/2021   AST 19 01/30/2021   ALT 16 01/30/2021   PROT 7.3 01/30/2021   ALBUMIN 4.5 01/30/2021   CALCIUM 10.0 02/12/2021   ANIONGAP 11 01/30/2021   EGFR 41 (L) 02/12/2021   GFR 33.87 (L) 12/29/2019   Lab Results  Component Value Date   CHOL 176 06/08/2019   Lab Results  Component Value Date   HDL 36 (L) 06/08/2019   Lab Results  Component Value Date   LDLCALC 94 06/08/2019   Lab Results  Component Value Date   TRIG 347 (H) 06/08/2019   Lab Results  Component Value Date   CHOLHDL 4.9 06/08/2019   Lab Results  Component Value Date   HGBA1C 4.9 11/13/2020      Assessment & Plan:    Problem List Items Addressed This Visit      Cardiovascular and Mediastinum   Essential hypertension, benign    BP Readings from Last 1 Encounters:  02/12/21 129/69   Well-controlled with Amlodipine 10 mg QD Counseled for compliance with the medications Advised DASH diet and moderate exercise/walking, at least 150 mins/week        Endocrine   Acquired hypothyroidism    Lab Results  Component Value Date   TSH 0.057 (L) 02/12/2021   On Levothyroxine 88 mcg QD, decreased to 75 mcg after reviewing TSH - could be contributing to diarrhea Will check TSH and free T4 in the next visit      Relevant Orders   TSH + free T4 (Completed)     Genitourinary   Stage 3a chronic kidney disease ([place])    Last CMP reviewed Stable Avoid nephrotoxic agents        Other   Diarrhea - Primary    Reports watery diarrhea for last few days Unclear etiology at the time of office visit - check GI profile  Addendum:  TSH low, oversupplementation of thyroid hormone could be a reason for diarrhea. Decreased dose of Levothyroxine.       Relevant Orders   Basic Metabolic Panel (BMET) (Completed)   GI Profile, Stool, PCR   Restless legs syndrome    Continue Ropinirole         Meds ordered this encounter  Medications  . DISCONTD: ciprofloxacin (CIPRO) 500 MG tablet    Sig: Take 1 tablet (500 mg total) by mouth 2 (two) times daily for 7 days.    Dispense:  14 tablet    Refill:  0    Follow-up: Return in about 4 months (around 06/14/2021).    Lindell Spar, MD

## 2021-02-15 NOTE — Assessment & Plan Note (Signed)
Last CMP reviewed Stable Avoid nephrotoxic agents

## 2021-02-18 LAB — GI PROFILE, STOOL, PCR

## 2021-03-07 ENCOUNTER — Other Ambulatory Visit: Payer: Self-pay

## 2021-03-07 ENCOUNTER — Inpatient Hospital Stay: Payer: Medicare Other

## 2021-03-07 ENCOUNTER — Other Ambulatory Visit: Payer: Self-pay | Admitting: Hematology and Oncology

## 2021-03-07 ENCOUNTER — Inpatient Hospital Stay: Payer: Medicare Other | Attending: Hematology and Oncology | Admitting: Hematology and Oncology

## 2021-03-07 VITALS — BP 137/58 | HR 57 | Temp 98.0°F | Resp 16

## 2021-03-07 VITALS — BP 151/53 | HR 61 | Temp 96.8°F | Resp 18 | Ht 62.0 in | Wt 125.2 lb

## 2021-03-07 DIAGNOSIS — Z5112 Encounter for antineoplastic immunotherapy: Secondary | ICD-10-CM

## 2021-03-07 DIAGNOSIS — C8580 Other specified types of non-Hodgkin lymphoma, unspecified site: Secondary | ICD-10-CM

## 2021-03-07 DIAGNOSIS — Z95828 Presence of other vascular implants and grafts: Secondary | ICD-10-CM | POA: Diagnosis not present

## 2021-03-07 DIAGNOSIS — C8514 Unspecified B-cell lymphoma, lymph nodes of axilla and upper limb: Secondary | ICD-10-CM | POA: Diagnosis not present

## 2021-03-07 LAB — CMP (CANCER CENTER ONLY)
ALT: 58 U/L — ABNORMAL HIGH (ref 0–44)
AST: 121 U/L — ABNORMAL HIGH (ref 15–41)
Albumin: 3.7 g/dL (ref 3.5–5.0)
Alkaline Phosphatase: 81 U/L (ref 38–126)
Anion gap: 9 (ref 5–15)
BUN: 17 mg/dL (ref 8–23)
CO2: 24 mmol/L (ref 22–32)
Calcium: 9.1 mg/dL (ref 8.9–10.3)
Chloride: 107 mmol/L (ref 98–111)
Creatinine: 1.28 mg/dL — ABNORMAL HIGH (ref 0.44–1.00)
GFR, Estimated: 43 mL/min — ABNORMAL LOW (ref >60)
Glucose, Bld: 117 mg/dL — ABNORMAL HIGH (ref 70–99)
Potassium: 3.8 mmol/L (ref 3.5–5.1)
Sodium: 140 mmol/L (ref 135–145)
Total Bilirubin: 0.9 mg/dL (ref 0.3–1.2)
Total Protein: 6.3 g/dL — ABNORMAL LOW (ref 6.5–8.1)

## 2021-03-07 LAB — CBC WITH DIFFERENTIAL (CANCER CENTER ONLY)
Abs Immature Granulocytes: 0.01 10*3/uL (ref 0.00–0.07)
Basophils Absolute: 0 10*3/uL (ref 0.0–0.1)
Basophils Relative: 1 %
Eosinophils Absolute: 0.4 10*3/uL (ref 0.0–0.5)
Eosinophils Relative: 6 %
HCT: 32.1 % — ABNORMAL LOW (ref 36.0–46.0)
Hemoglobin: 11.5 g/dL — ABNORMAL LOW (ref 12.0–15.0)
Immature Granulocytes: 0 %
Lymphocytes Relative: 24 %
Lymphs Abs: 1.5 10*3/uL (ref 0.7–4.0)
MCH: 29.9 pg (ref 26.0–34.0)
MCHC: 35.8 g/dL (ref 30.0–36.0)
MCV: 83.4 fL (ref 80.0–100.0)
Monocytes Absolute: 0.7 10*3/uL (ref 0.1–1.0)
Monocytes Relative: 11 %
Neutro Abs: 3.8 10*3/uL (ref 1.7–7.7)
Neutrophils Relative %: 58 %
Platelet Count: 152 10*3/uL (ref 150–400)
RBC: 3.85 MIL/uL — ABNORMAL LOW (ref 3.87–5.11)
RDW: 13 % (ref 11.5–15.5)
WBC Count: 6.4 10*3/uL (ref 4.0–10.5)
nRBC: 0 % (ref 0.0–0.2)

## 2021-03-07 LAB — LACTATE DEHYDROGENASE: LDH: 320 U/L — ABNORMAL HIGH (ref 98–192)

## 2021-03-07 MED ORDER — FAMOTIDINE IN NACL 20-0.9 MG/50ML-% IV SOLN
20.0000 mg | Freq: Once | INTRAVENOUS | Status: DC
  Filled 2021-03-07: qty 50

## 2021-03-07 MED ORDER — DIPHENHYDRAMINE HCL 25 MG PO CAPS
ORAL_CAPSULE | ORAL | Status: AC
  Filled 2021-03-07: qty 2

## 2021-03-07 MED ORDER — SODIUM CHLORIDE 0.9 % IV SOLN
Freq: Once | INTRAVENOUS | Status: AC
  Filled 2021-03-07: qty 250

## 2021-03-07 MED ORDER — SODIUM CHLORIDE 0.9% FLUSH
10.0000 mL | INTRAVENOUS | Status: DC | PRN
  Administered 2021-03-07: 10 mL
  Filled 2021-03-07: qty 10

## 2021-03-07 MED ORDER — SODIUM CHLORIDE 0.9 % IV SOLN
375.0000 mg/m2 | Freq: Once | INTRAVENOUS | Status: AC
  Administered 2021-03-07: 600 mg via INTRAVENOUS
  Filled 2021-03-07: qty 10

## 2021-03-07 MED ORDER — FAMOTIDINE 20 MG IN NS 100 ML IVPB
INTRAVENOUS | Status: AC
  Filled 2021-03-07: qty 100

## 2021-03-07 MED ORDER — ACETAMINOPHEN 325 MG PO TABS
ORAL_TABLET | ORAL | Status: AC
  Filled 2021-03-07: qty 2

## 2021-03-07 MED ORDER — FAMOTIDINE 20 MG IN NS 100 ML IVPB
20.0000 mg | Freq: Once | INTRAVENOUS | Status: AC
  Administered 2021-03-07: 20 mg via INTRAVENOUS

## 2021-03-07 MED ORDER — SODIUM CHLORIDE 0.9% FLUSH
10.0000 mL | INTRAVENOUS | Status: AC | PRN
Start: 2021-03-07 — End: 2021-03-07
  Administered 2021-03-07: 10 mL
  Filled 2021-03-07: qty 10

## 2021-03-07 MED ORDER — HEPARIN SOD (PORK) LOCK FLUSH 100 UNIT/ML IV SOLN
500.0000 [IU] | Freq: Once | INTRAVENOUS | Status: AC | PRN
  Administered 2021-03-07: 500 [IU]
  Filled 2021-03-07: qty 5

## 2021-03-07 MED ORDER — ACETAMINOPHEN 325 MG PO TABS
650.0000 mg | ORAL_TABLET | Freq: Once | ORAL | Status: AC
  Administered 2021-03-07: 650 mg via ORAL

## 2021-03-07 MED ORDER — DIPHENHYDRAMINE HCL 25 MG PO CAPS
50.0000 mg | ORAL_CAPSULE | Freq: Once | ORAL | Status: AC
Start: 2021-03-07 — End: 2021-03-07
  Administered 2021-03-07: 50 mg via ORAL

## 2021-03-07 NOTE — Progress Notes (Signed)
[place] [phone]   [phone]  PROGRESS NOTE  Patient Care Team: Lindell Spar, MD as PCP - General (Internal Medicine) Danie Binder, MD (Inactive) as Consulting Physician (Gastroenterology) Minus Breeding, MD as Consulting Physician (Cardiology) Annitta Needs, NP (Gastroenterology) Carlis Stable, NP as Nurse Practitioner (Gastroenterology) Montez Morita, Quillian Quince, MD as Consulting Physician (Gastroenterology) Eloise Harman, DO as Consulting Physician (Internal Medicine) Cameron Sprang, MD as Consulting Physician (Neurology)  Hematological/Oncological History  # Low Grade Marginal Zone Lymphoma, Stage III 1) 06/06/2019: patient underwent a diagnostic mammogram of the left breast. Calcifications noted in the left breast, recommended 6 month f/u imaging. 2) 02/02/2020: bilateral diagnostic mammogram showed abnormal enlarged lymph nodes with cortical thickening. The largest of these measures 2.2 x 1.7 x 2.0 cm.  3) 02/02/2020: US guided biopsy of left axillary lymph nodes performed. 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 The overall features are atypical and highly suspicious for non-Hodgkin B-cell lymphoma, particularly marginal zone lymphoma. 4) 02/16/2020: establish care with Dr. Lorenso Courier  5) 02/29/2020: PET CT scan performed, showed hypermetabolic lymph nodes identified in the posterior left neck, both axillary/subpectoral regions, mediastinum, hila, right external iliac chain, and right groin. Massive splenomegaly of 17.8 cm noted as well.  6) 03/20/2020: excisional biopsy of left posterior cervical lymph node. Biopsy confirmed most likely low grade marginal zone lymphoma.   7) 7/14-7/15/2021: Cycle 1 Day 1 of Rituximab/Bendamycin 8) 03/30/2020-04/01/2020: admitted inpatient for hematemesis. Noted to have marked esophagitis on EGD.  9) 04/25/2020: IV feraheme 510mg   administered 10) 04/26/2020: Cycle 2 Day 1 of Rituximab/Bendamycin 11) 05/10/2020: HOLD R-Benda in setting of colitis, GI bleed, and intolerance to therapy. Plan to start monotherapy Rituximab once patient has rebounded.  12) 07/06/2020: Cycle 1 Week 1 of monotherapy rituximab 13) 07/30/2020: Cycle 1 Week 3 of monotherapy rituximab 14) 08/06/2020: Completed Cycle 1 of monotherapy rituximab 15)  09/12/2020: PET CT scan shows completed resolution of tumor and FDG avid lesions. Deauville Score 1. Complete response.  16) 11/16/2020: Cycle 1 Day 1 of Maintenance Rituximab  17) 01/10/2021:  Cycle 2 Day 1 of Maintenance Rituximab  18) 03/07/2021: Cycle 3 Day 1 of Maintenance Rituximab   Interval History:  Elizabeth Mcintyre 80 y.o. female with medical history significant for low grade marginal zone lymphoma stage IIIA who presents for a follow up visit prior Cycle 3 Day 1 of maintenance Rituximab.   On exam today, Mrs. Casillas reports that she has been well overall in the interim since her last visit.  She does unfortunately continue to have diarrhea of unclear etiology.  She was tested for C. difficile and found to be negative.  She does have Imodium but often forgets to take it.  She has been having difficulties with memory and is currently being worked up for cognitive decline.  She has a long assessment coming up soon in order to better evaluate her memory deficits.  Otherwise she is tolerated rituximab therapy well and is willing and able to continue at this time.  She denies any fevers, chills, night sweats, shortness of breath, chest pain, cough, neuropathy or skin changes. The rest of the 10 point ROS is negative.  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   Lower GI bleeding 04/30/2020   Lymphadenopathy 01/23/2020   Osteopenia 2017   Last  bone density 05/04/2017: -2.4   PONV (postoperative nausea and vomiting)    Schatzki's ring    Stress incontinence    Stroke ([place])    mini stroke - found on a CT scan    SURGICAL HISTORY: Past Surgical History:  Procedure Laterality Date   ABDOMINAL HYSTERECTOMY     BALLOON DILATION N/A 08/21/2020   Procedure: BALLOON DILATION;  Surgeon: Eloise Harman, DO;  [facility];  Service: Endoscopy;  Laterality: N/A;   BIOPSY  03/30/2020   Procedure: BIOPSY;  Surgeon: Ronald Lobo, MD;  Location: [facility];  Service: Endoscopy;;   BIOPSY  05/02/2020   Procedure: BIOPSY;  Surgeon: Montez Morita, Quillian Quince, MD;  Location: [facility];  Service: Gastroenterology;;   CARDIAC CATHETERIZATION     X 2, last one in [place] 04/13/2017   Procedure: LAPAROSCOPIC CHOLECYSTECTOMY;  Surgeon: Aviva Signs, MD;  Location: [facility];  Service: General;  Laterality: N/A;   COLONOSCOPY     COLONOSCOPY  May 2012   Dr. Olevia Perches: mild diverticulosis, otherwise normal.    COLONOSCOPY WITH PROPOFOL N/A 05/02/2020   Dr. Jenetta Downer: 8 mm polyp removed from the ascending colon, 2 mm polyp removed from the ascending colon.  Tubular adenomas.  Diverticulosis.  Mucosal ulceration in the sigmoid colon noted, pathology consistent with ischemic colitis.   ESOPHAGOGASTRODUODENOSCOPY N/A 01/28/2015   Dr. Gala Romney: reflux esophagitis, Schatzki's ring not manipulated due to recent  bleeding   ESOPHAGOGASTRODUODENOSCOPY N/A 03/30/2015   Dr. Gala Romney: Schatzki's ring s/p Venia Minks dilation, previously noted esophageal ulcer completely healed   ESOPHAGOGASTRODUODENOSCOPY N/A 03/30/2020   Buccini: Moderately severe erosive, circumferential, confluent esophagitis with no bleeding found 25 to 40 cm from incisors.  Nonobstructing and mild Schatzki ring, there were also multiple distal esophageal rings noted, minimal hiatal hernia.   ESOPHAGOGASTRODUODENOSCOPY (EGD) WITH PROPOFOL N/A 08/21/2020   Procedure: ESOPHAGOGASTRODUODENOSCOPY (EGD) WITH PROPOFOL;  Surgeon: Eloise Harman, DO;  [facility];  Service: Endoscopy;  Laterality: N/A;  2:15pm   IR IMAGING GUIDED PORT INSERTION  03/13/2020   LYMPH NODE BIOPSY Left 03/20/2020   Procedure: LEFT POSTERIOR CERVICAL LYMPH NODE BIOPSY;  Surgeon: Georganna Skeans, MD;  Location: [facility];  Service: General;  Laterality: Left;   MALONEY DILATION N/A 03/30/2015   Procedure: Keturah Shavers;  Surgeon: Daneil Dolin, MD;  [facility];  Service: Endoscopy;  Laterality: N/A;   POLYPECTOMY  05/02/2020   Procedure: POLYPECTOMY;  Surgeon: Harvel Quale, MD;  [facility];  Service: Gastroenterology;;    SOCIAL HISTORY: Social History   Socioeconomic History   Marital status: Widowed    Spouse name: Not on file   Number of children: 2   Years of  education: Not on file   Highest education level: Not on file  Occupational History   Occupation: retired  Tobacco Use   Smoking status: Never   Smokeless tobacco: Never  Vaping Use   Vaping Use: Never used  Substance and Sexual Activity   Alcohol use: No   Drug use: No   Sexual activity: Never  Other Topics Concern   Not on file  Social History Narrative   Ms. Quirarte is widowed. Her young grandson lives with her, for whom she shares custody with her daughter, the son's aunt.     Right handed    Social Determinants of Health   Financial Resource  Strain: Not on file  Food Insecurity: Not on file  Transportation Needs: Not on file  Physical Activity: Not on file  Stress: Not on file  Social Connections: Not on file  Intimate Partner Violence: Not on file    FAMILY HISTORY: Family History  Problem Relation Age of Onset   Heart disease Mother    Osteoarthritis Mother    Sudden death Father    Single kidney Father    Other Father        h/o severe MVA injuries   Hyperlipidemia Sister    Other Daughter        Myalgias   Fibromyalgia Daughter    Allergies Daughter    Heart disease Maternal Grandfather    Sudden death Paternal Grandmother    Diabetes Paternal Grandfather    Heart disease Daughter    Other Daughter        palpitations   Pulmonary fibrosis Maternal Aunt    Cancer Paternal Uncle    Pulmonary fibrosis Maternal Aunt    Colon cancer Neg Hx     ALLERGIES:  is allergic to codeine phosphate.  MEDICATIONS:  Current Outpatient Medications  Medication Sig Dispense Refill   acetaminophen (TYLENOL) 325 MG tablet Take 325 mg by mouth every 6 (six) hours as needed for moderate pain.      albuterol (VENTOLIN HFA) 108 (90 Base) MCG/ACT inhaler Inhale 2 puffs into the lungs every 6 (six) hours as needed for wheezing. 8.5 g 2   amLODipine (NORVASC) 10 MG tablet Take 1 tablet (10 mg total) by mouth daily. 90 tablet 1   Ascorbic Acid (VITAMIN C) 100 MG tablet Take 100 mg by mouth daily.     cetirizine (ZYRTEC) 10 MG tablet Take 10 mg by mouth at bedtime. As needed     Cholecalciferol (VITAMIN D3) 25 MCG (1000 UT) CAPS Take 1,000 Units by mouth daily.      donepezil (ARICEPT) 10 MG tablet Take 1/2 tablet daily for 2 weeks, then increase to 1 tablet daily (Patient taking differently: Take 10 mg by mouth daily.) 30 tablet 11   levothyroxine (SYNTHROID) 75 MCG tablet Take 1 tablet (75 mcg total) by mouth daily. 30 tablet 3   lidocaine-prilocaine (EMLA) cream Apply 1 application topically as needed. (Patient taking differently:  Apply 1 application topically as needed (port access).) 30 g 1   pantoprazole (PROTONIX) 40 MG tablet Take 1 tablet (40 mg total) by mouth daily. 90 tablet 3   potassium chloride SA (KLOR-CON) 20 MEQ tablet Take 1 tablet (20 mEq total) by mouth daily. 21 tablet 0   rOPINIRole (REQUIP) 0.25 MG tablet Take 1 tablet (0.25 mg total) by mouth at bedtime. 90 tablet 1   rosuvastatin (CRESTOR) 20 MG tablet Take 1 tablet (20 mg total) by mouth daily. 90 tablet 3  sucralfate (CARAFATE) 1 g tablet Take 1 g by mouth 4 (four) times daily -  with meals and at bedtime. As needed     vitamin B-12 (CYANOCOBALAMIN) 1000 MCG tablet Take 1,000 mcg by mouth daily.      No current facility-administered medications for this visit.   Facility-Administered Medications Ordered in Other Visits  Medication Dose Route Frequency Provider Last Rate Last Admin   heparin lock flush 100 unit/mL  500 Units Intracatheter Once PRN Ledell Peoples IV, MD       sodium chloride flush (NS) 0.9 % injection 10 mL  10 mL Intracatheter PRN Orson Slick, MD        REVIEW OF SYSTEMS:   Constitutional: ( - ) fevers, ( - )  chills , ( - ) night sweats Eyes: ( - ) blurriness of vision, ( - ) double vision, ( - ) watery eyes Ears, nose, mouth, throat, and face: ( - ) mucositis, ( - ) sore throat Respiratory: ( - ) cough, ( - ) dyspnea, ( - ) wheezes Cardiovascular: ( - ) palpitation, ( - ) chest discomfort, ( - ) lower extremity swelling Gastrointestinal:  ( - ) nausea, ( - ) heartburn, ( - ) change in bowel habits Skin: ( - ) abnormal skin rashes Lymphatics: ( - ) new lymphadenopathy, ( - ) easy bruising Neurological: ( - ) numbness, ( - ) tingling, ( - ) new weaknesses Behavioral/Psych: ( - ) mood change, ( - ) new changes  All other systems were reviewed with the patient and are negative.  PHYSICAL EXAMINATION: ECOG PERFORMANCE STATUS: 1 - Symptomatic but completely ambulatory  Vitals:   03/07/21 1043  BP: (!) 151/53   Pulse: 61  Resp: 18  Temp: (!) 96.8 F (36 C)  SpO2: 100%   Filed Weights   03/07/21 1043  Weight: 125 lb 3.2 oz (56.8 kg)    GENERAL: well appearing elderly Caucasian female in NAD  SKIN: skin color, texture, turgor are normal. EYES: conjunctiva are pink and non-injected, sclera clear NECK: supple, non-tender.  LUNGS: clear to auscultation and percussion with normal breathing effort HEART: regular rate & rhythm and no murmurs and no lower extremity edema Musculoskeletal: no cyanosis of digits and no clubbing  PSYCH: alert & oriented x 3, fluent speech NEURO: no focal motor/sensory deficits  LABORATORY DATA:  I have reviewed the data as listed CBC Latest Ref Rng & Units 03/07/2021 01/30/2021 01/10/2021  WBC 4.0 - 10.5 K/uL 6.4 6.9 5.6  Hemoglobin 12.0 - 15.0 g/dL 11.5(L) 12.8 14.0  Hematocrit 36.0 - 46.0 % 32.1(L) 37.5 40.4  Platelets 150 - 400 K/uL 152 186 171    CMP Latest Ref Rng & Units 03/07/2021 02/12/2021 01/30/2021  Glucose 70 - 99 mg/dL 117(H) 137(H) 106(H)  BUN 8 - 23 mg/dL 17 15 24(H)  Creatinine 0.44 - 1.00 mg/dL 1.28(H) 1.34(H) 1.36(H)  Sodium 135 - 145 mmol/L 140 140 137  Potassium 3.5 - 5.1 mmol/L 3.8 4.4 3.8  Chloride 98 - 111 mmol/L 107 99 100  CO2 22 - 32 mmol/L 24 22 26   Calcium 8.9 - 10.3 mg/dL 9.1 10.0 9.7  Total Protein 6.5 - 8.1 g/dL 6.3(L) - 7.3  Total Bilirubin 0.3 - 1.2 mg/dL 0.9 - 0.6  Alkaline Phos 38 - 126 U/L 81 - 76  AST 15 - 41 U/L 121(H) - 19  ALT 0 - 44 U/L 58(H) - 16    RADIOGRAPHIC STUDIES: MR BRAIN W  WO CONTRAST  Result Date: 02/12/2021 CLINICAL DATA:  Memory loss. EXAM: MRI HEAD WITHOUT AND WITH CONTRAST TECHNIQUE: Multiplanar, multiecho pulse sequences of the brain and surrounding structures were obtained without and with intravenous contrast. CONTRAST:  53mL MULTIHANCE GADOBENATE DIMEGLUMINE 529 MG/ML IV SOLN COMPARISON:  Head CT Jan 30, 2021 FINDINGS: Brain: No acute infarction, hemorrhage, hydrocephalus, extra-axial collection or  mass lesion. Remote lacunar infarcts in the bilateral cerebellar hemispheres. Scattered foci of T2 hyperintensity are seen within the white matter of the cerebral hemispheres, nonspecific, most likely related to chronic small vessel ischemia. Mild parenchymal volume loss with moderate prominence of the temporal horn of the right lateral ventricle. No focus of abnormal contrast enhancement. Vascular: Normal flow voids. Skull and upper cervical spine: Normal marrow signal. Sinuses/Orbits: Negative. Other: Right mastoid effusion. IMPRESSION: 1. No acute intracranial abnormality. 2. Mild chronic microangiopathic changes of the white matter and parenchymal volume loss. Moderate prominence of the temporal horn of the right lateral ventricle suggesting asymmetric volume loss of the right hippocampus. Electronically Signed   By: Pedro Earls M.D.   On: 02/12/2021 16:01    ASSESSMENT & PLAN Elizabeth Mcintyre 80 y.o. 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  Previously I recommend a rituximab monotherapy given her intolerance of BR x 2 cycles. This regimen consisted of rituximab 375 mg/m2 IV q weekly x 4 weeks. During her scans on 8/16 - 05/03/2020 she was shown to have marked response to therapy with normalization of spleen size and resolution of lymphadenopathy. Repeat PET CT scan on 09/12/20 after completed of rituximab monotherapy showed a completed response. We will proceed with q 2 months maintenance/consolidation rituximab x 4 cycles.   GELF Criteria: 1 (splenomegaly). Indication for treatment  # Low Grade Marginal Zone Lymphoma, Stage III -- discontinued bendamustine + rituximab as patient is unable to tolerate this treatment. Will pursue rituximab monotherapy instead. Started therapy with Cycle 1 Day 1 on 03/28/2020 BR, decreased to Ritux alone q weekly x 4 starting 07/06/2020. --patient completed excisional  lymph node biopsy to confirm the diagnosis. Pathological exam of the lymph node confirms marginal zone lymphoma.  --PET CT scan confirms stage III disease. Patient meets GELF criteria for treatment (splenomegaly). She did not have any B symptoms and her counts were stable at diagnosis, with some mild thrombocytopenia.  --Patient completed monotherapy rituximab x 4 weeks on 08/06/2020.  --post treatment PET CT scan from 09/12/20 showed Deauville Score 1, complete response to therapy. Recommend to proceed with q 8 week rituximab 375mg /m2 maintenance therapy. This can be continued up to 4 doses --Patient started C1D1 of maintenance Rituximab on 11/16/2020.  --Patient returns today for C3D1 of maintenance Rituximab. Labs from today were reviewed without any intervention needed. Patient will proceed with treatment as planned and return to the clinic in 8 weeks prior to C4D1.   #Concern for GI Bleed, resolved #Hematemesis, resolved #Colitis, resolved --patient admitted on 03/30/2020 (day after Cycle 1 chemotherapy) with hematemesis --no overt GI bleeding noted on Upper GI evaluation -- no signs of bleeding since last visit.  --iron levels appeared low previously and patient was having symptoms of iron deficiency anemia including fatigue, restless leg, and dyspnea on exertion. -- Repeat labs today, Hgb 11.5, modestly decreased from baseline. --continue to monitor.   #Symptom Management -- zofran 8mg  PO q8H PRN and compazine 10mg  PO q6H PRN --  EMLA cream for patient's port  site --d/c allopurinol --for abdominal pain/headaches, can take tylenol 650mg -1000mg  PO q8H PRN.   No orders of the defined types were placed in this encounter.   All questions were answered. The patient knows to call the clinic with any problems, questions or concerns.  A total of more than 30 minutes were spent on this encounter and over half of that time was spent on counseling and coordination of care as outlined above.    Ledell Peoples, MD Department of Hematology/Oncology [place] [facility] Phone: [phone] Pager: [phone] Email: [email or web address]  03/07/2021 2:09 PM   Literature Support:  NCCN Guidelines: Consolidation with rituximab should be offered if initially treated with single agent rituximab. (MS-63)  UptoDate: Different schedules have been used. The following administration schedules were used in the randomized trials and are equally acceptable approaches:  ?Rituximab 375 mg/m2 intravenously per week for a total of four doses  ?Rituximab 375 mg/m2 intravenously per week for four weeks, followed by four additional doses administered every two months

## 2021-03-07 NOTE — Patient Instructions (Signed)
[facility]COL[facility]  Discharge Instructions: Thank you for choosing [facility] to provide your oncology and hematology care.   If you have a lab appointment with the Cancer Center, please go directly to the Cancer Center and check in at the registration area.   Wear comfortable clothing and clothing appropriate for easy access to any Portacath or PICC line.   We strive to give you quality time with your provider. You may need to reschedule your appointment if you arrive late (15 or more minutes).  Arriving late affects you and other patients whose appointments are after yours.  Also, if you miss three or more appointments without notifying the office, you may be dismissed from the clinic at the provider's discretion.      For prescription refill requests, have your pharmacy contact our office and allow 72 hours for refills to be completed.    Today you received the following chemotherapy and/or immunotherapy agents rituxan      To help prevent nausea and vomiting after your treatment, we encourage you to take your nausea medication as directed.  BELOW ARE SYMPTOMS THAT SHOULD BE REPORTED IMMEDIATELY: *FEVER GREATER THAN 100.4 F (38 C) OR HIGHER *CHILLS OR SWEATING *NAUSEA AND VOMITING THAT IS NOT CONTROLLED WITH YOUR NAUSEA MEDICATION *UNUSUAL SHORTNESS OF BREATH *UNUSUAL BRUISING OR BLEEDING *URINARY PROBLEMS (pain or burning when urinating, or frequent urination) *BOWEL PROBLEMS (unusual diarrhea, constipation, pain near the anus) TENDERNESS IN MOUTH AND THROAT WITH OR WITHOUT PRESENCE OF ULCERS (sore throat, sores in mouth, or a toothache) UNUSUAL RASH, SWELLING OR PAIN  UNUSUAL VAGINAL DISCHARGE OR ITCHING   Items with * indicate a potential emergency and should be followed up as soon as possible or go to the Emergency Department if any problems should occur.  Please show the CHEMOTHERAPY ALERT CARD or IMMUNOTHERAPY ALERT CARD at check-in to the  Emergency Department and triage nurse.  Should you have questions after your visit or need to cancel or reschedule your appointment, please contact [facility]  Dept: [phone]  and follow the prompts.  Office hours are 8:00 a.m. to 4:30 p.m. Monday - Friday. Please note that voicemails left after 4:00 p.m. may not be returned until the following business day.  We are closed weekends and major holidays. You have access to a nurse at all times for urgent questions. Please call the main number to the clinic Dept: [phone] and follow the prompts.   For any non-urgent questions, you may also contact your provider using MyChart. We now offer e-Visits for anyone 18 and older to request care online for non-urgent symptoms. For details visit [email or web address].   Also download the MyChart app! Go to the app store, search "MyChart", open the app, select [facility], and log in with your MyChart username and password.  Due to Covid, a mask is required upon entering the hospital/clinic. If you do not have a mask, one will be given to you upon arrival. For doctor visits, patients may have 1 support person aged 18 or older with them. For treatment visits, patients cannot have anyone with them due to current Covid guidelines and our immunocompromised population.   

## 2021-03-13 ENCOUNTER — Telehealth: Payer: Self-pay | Admitting: Hematology and Oncology

## 2021-03-13 NOTE — Telephone Encounter (Signed)
Sch per 6/22 los, pt daughter aware.

## 2021-03-22 ENCOUNTER — Telehealth: Payer: Self-pay | Admitting: Neurology

## 2021-03-22 MED ORDER — DONEPEZIL HCL 10 MG PO TABS: ORAL_TABLET | ORAL | 11 refills | Status: DC

## 2021-03-22 NOTE — Telephone Encounter (Signed)
Called Elizabeth Mcintyre and informed her that patients rx has been sent to the pharmacy. Elizabeth Mcintyre thanked [place] for the call.

## 2021-03-22 NOTE — Telephone Encounter (Signed)
Pt's daughter called in stating the pt's donepezil did not have enough pills in the bottle for her to be taking the 1/2 tablet for 2 weeks and then 1 daily. There are refills, but they won't fill it until the 12th, but she will be out before then.

## 2021-03-22 NOTE — Telephone Encounter (Signed)
Rx for Donepezil 10mg  daily sent to pharmacy on file, thanks

## 2021-03-27 ENCOUNTER — Other Ambulatory Visit: Payer: Self-pay

## 2021-03-27 ENCOUNTER — Encounter: Payer: Self-pay | Admitting: Counselor

## 2021-03-27 ENCOUNTER — Ambulatory Visit: Payer: Medicare Other | Admitting: Psychology

## 2021-03-27 ENCOUNTER — Ambulatory Visit (INDEPENDENT_AMBULATORY_CARE_PROVIDER_SITE_OTHER): Payer: Medicare Other | Admitting: Counselor

## 2021-03-27 DIAGNOSIS — R419 Unspecified symptoms and signs involving cognitive functions and awareness: Secondary | ICD-10-CM

## 2021-03-27 DIAGNOSIS — F329 Major depressive disorder, single episode, unspecified: Secondary | ICD-10-CM

## 2021-03-27 DIAGNOSIS — F09 Unspecified mental disorder due to known physiological condition: Secondary | ICD-10-CM

## 2021-03-27 DIAGNOSIS — F32A Depression, unspecified: Secondary | ICD-10-CM

## 2021-03-27 NOTE — Progress Notes (Signed)
[facility]  Mcintyre Name: Elizabeth Mcintyre MRN: [id number] Date of Birth: 1941/08/06 Age: 80 y.o. Education: 12 years  Referral Circumstances and Background Information  Elizabeth Mcintyre is a 80 y.o., right-hand dominant, widowed woman with a history of HTN, carotid artery stenosis, CKDIII, marginal zone lymphoma and memory and thinking problems who was recently seen by Dr. Delice Mcintyre with our outpatient neurology practice. She was referred for neuropsychological evaluation in Elizabeth service of diagnostic clarification.    On interview, Elizabeth Mcintyre reported that she has problems with memory but she is not sure when they started. Her daughter Elizabeth Mcintyre reported that Elizabeth changes started about 2 years ago, with getting lost when she would come to visit (wwendy has lived Elizabeth same place forever so this is a familiar route to Elizabeth Mcintyre). She was also misplacing things in her house. She was undergoing treatment for cancer at that time and was staying with her daughter as a result, who noticed that she would repeat herself. She isn't sure if there has been any worsening over time. On specific review of symptoms, she notices that Elizabeth Mcintyre forgets things over Elizabeth course of about 3 hours although sometimes, it is only partial (she will say "I know that I asked you this before but I can't recall what you said."). She feels like she has problems with attention and concentration, she seems distracted in conversations. She will move things and then doesn't pay attention and misplaces them. She has no delusions of stealing or suspiciousness. She has problems with orientation to time and gets confused about Elizabeth time of day, she will suggest they stop to get dinner and it is only 3pm. They do not notice much in Elizabeth way of problems with language. She has some minor difficulties with judgment and problem solving, she paid ahead when she should not have related to back taxes. They are in  Elizabeth process of selling her house and it would have been better for her to wait. She also has constant sensations of Deja Vu. With respect to mood, Elizabeth Mcintyre said she is depressed but had a hard time identifying why. She has had a lot of changes in terms of selling a house (she has two). She is worried about her health. She doesn't have a lot of energy. She has a disordered sleep schedule and will stay up till 4am and then sleep till 10am or 11am. She was having a hard time with restless legs. She has lost a significant portion of weight, since her cancer diagnosis and has no appetite. She needs to be reminded to eat.   With respect to functioning, Elizabeth Mcintyre is still managing her own finances. She has a Chief Executive Officer. Her daughter does check behind her and she does well. She does have significant anxiety, however, "that book worries her to death." Elizabeth Mcintyre is driving locally, she is having a hard time with directions on longer drives, such as when visiting her daughter who lives 45 minutes away. She has been going to her daughters for 15 years or more. She also seems disoriented when driving with her daughter. She has a lot of difficulties with time relationships and thinks things happened just a while ago when it has been a long time. She is not reliable with self-administration of medication, her daughter has been monitoring because she doesn't want to step in and take everything away. She noticed that she has leftover  medications in her pill minder that she cannot explain. She has a hard time particularly with changes to prescriptions. She doesn't cook, her son does that, he lives with her. She doesn't do much outside Elizabeth home in Elizabeth community. She has been living with her son Elizabeth past 3 weeks. She will sometimes re-wear clothes but does not wear soiled clothes. She does not need to be prompted to engage in hygiene tasks.   Past Medical History and Review of  Relevant Studies   Mcintyre Active Problem List   Diagnosis Date Noted   Restless legs syndrome 01/02/2021   Prediabetes 08/14/2020   Mixed hyperlipidemia 08/14/2020   Active chemotherapy 04/30/2020   Dysphagia 04/18/2020   Port-A-Cath in place 03/28/2020   Marginal zone lymphoma ([place]) 03/01/2020   Hepatic steatosis 01/04/2020   Splenomegaly 01/04/2020   Thrombocytopenia ([place]) 01/04/2020   Diarrhea 09/10/2018   Carotid artery stenosis 06/13/2016   Stage 3a chronic kidney disease ([place]) 02/14/2016   Vitamin D deficiency 05/25/2015   Gastroesophageal reflux disease with esophagitis 03/06/2015   Acquired hypothyroidism 05/30/2014   Osteopenia 12/02/2013   Major depression, recurrent, chronic ([place]) 04/03/2011   Mild intermittent asthma 11/09/2007   Essential hypertension, benign 11/09/2007   Review of Neuroimaging and Relevant Medical History: Elizabeth Mcintyre has no history of clinically evident stroke, seizures, or head injuries with loss of consciousness.   There is an MRI of Elizabeth brain from 02/12/2021 that shows mild generalized volume loss and there may be some slight disproportionality within Elizabeth mesial temporal lobes, especially near Elizabeth head of Elizabeth right hippocampal amygdalar complex. There is a mild burden of FLAIR and T2 high signal changes in Elizabeth subcortical cerebral white matter.   [facility] 14/30 with Dr. Delice Mcintyre (01/17/2021)  Current Outpatient Medications  Medication Sig Dispense Refill   acetaminophen (TYLENOL) 325 MG tablet Take 325 mg by mouth every 6 (six) hours as needed for moderate pain.      albuterol (VENTOLIN HFA) 108 (90 Base) MCG/ACT inhaler Inhale 2 puffs into Elizabeth lungs every 6 (six) hours as needed for wheezing. 8.5 g 2   amLODipine (NORVASC) 10 MG tablet Take 1 tablet (10 mg total) by mouth daily. 90 tablet 1   Ascorbic Acid (VITAMIN C) 100 MG tablet Take 100 mg by mouth daily.     cetirizine (ZYRTEC) 10 MG tablet Take 10 mg by mouth at bedtime. As needed      Cholecalciferol (VITAMIN D3) 25 MCG (1000 UT) CAPS Take 1,000 Units by mouth daily.      donepezil (ARICEPT) 10 MG tablet Take 1 tablet daily 30 tablet 11   levothyroxine (SYNTHROID) 75 MCG tablet Take 1 tablet (75 mcg total) by mouth daily. 30 tablet 3   lidocaine-prilocaine (EMLA) cream Apply 1 application topically as needed. (Mcintyre taking differently: Apply 1 application topically as needed (port access).) 30 g 1   pantoprazole (PROTONIX) 40 MG tablet Take 1 tablet (40 mg total) by mouth daily. 90 tablet 3   potassium chloride SA (KLOR-CON) 20 MEQ tablet Take 1 tablet (20 mEq total) by mouth daily. 21 tablet 0   rOPINIRole (REQUIP) 0.25 MG tablet Take 1 tablet (0.25 mg total) by mouth at bedtime. 90 tablet 1   rosuvastatin (CRESTOR) 20 MG tablet Take 1 tablet (20 mg total) by mouth daily. 90 tablet 3   sucralfate (CARAFATE) 1 g tablet Take 1 g by mouth 4 (four) times daily -  with meals and at bedtime. As needed  vitamin B-12 (CYANOCOBALAMIN) 1000 MCG tablet Take 1,000 mcg by mouth daily.      No current facility-administered medications for this visit.   Family History  Problem Relation Age of Onset   Heart disease Mother    Osteoarthritis Mother    Sudden death Father    Single kidney Father    Other Father        h/o severe MVA injuries   Hyperlipidemia Sister    Other Daughter        Myalgias   Fibromyalgia Daughter    Allergies Daughter    Heart disease Maternal Grandfather    Sudden death Paternal Grandmother    Diabetes Paternal Grandfather    Heart disease Daughter    Other Daughter        palpitations   Pulmonary fibrosis Maternal Aunt    Cancer Paternal Uncle    Pulmonary fibrosis Maternal Aunt    Colon cancer Neg Hx    There is no  family history of dementia. There is no  family history of psychiatric illness.  Psychosocial History  Developmental, Educational and Employment History: Elizabeth Mcintyre reported that she did very well in school and earned near  straight A's. 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 She liked that a great deal. At thei height of her career she had about 10-20 people under her. She retired around 2010 and did remember that.   Psychiatric History: Elizabeth Mcintyre denied that she has ever been treated for depression although her daughter said she has. I do not see a medication for mood in her medication list but they are saying she previously had one and just didn't take it.   Substance Use History: Elizabeth Mcintyre reported that she does not regularly consume alcohol, she doesn't use nicotine or illicit substances.   Relationship History and Living Cimcumstances: Elizabeth Mcintyre was previously married for about 40 years. Her husband passed 20 years ago. She has three children, she lives with her son Darnell Level and her daughter Elizabeth Mcintyre is here with her today and they are Elizabeth ones primarily involved in her care. It sounds like her son is learning to help her more but there is a learning curve.   Mental Status and Behavioral Observations  Sensorium/Arousal: Elizabeth Mcintyre's level of arousal was awake and alert. Hearing and vision were adequate for testing purposes. Orientation: Elizabeth Mcintyre was alert and generally oriented Appearance: Dressed neatly in appropriate, casual clothing with adequate grooming and hygiene.  Behavior: Elizabeth Mcintyre was pleasant and appropriate. She did defer to her daughter frequently during Elizabeth interview.  Speech/language: Normal in rate, rhythm, volume and prosody Gait/Posture: Gait was normal on observation of transfer between rooms Movement: No rest tremor, bradykinesia, hypokinesia noted Social Comportment: Appropriate and pleasant.  Mood: "Depressed" Affect: Dysphoric  Thought process/content: Thought process was logical, linear, and goal-directed. Elizabeth Mcintyre often deferred to her daughter. She was able to provide reasonably detailed history and timeline.  Safety: No thoughts of  harming self or others noted Insight: Fair, Mcintyre has some awareness of changes  MMSE - Mini Mental State Exam 03/27/2021 09/29/2018 03/13/2017  Orientation to time 5 5 5   Orientation to Place 5 5 5   Registration 3 3 3   Attention/ Calculation 3 3 5   Recall 2 2 3   Language- name 2 objects 2 2 2   Language- repeat 1 1 1   Language- follow 3 step command 1 1 3   Language- read & follow direction  1 1 1   Write a sentence 1 1 1   Copy design 1 1 1   Total score 25 25 30    Test Procedures  Wide Range Achievement Test - 4             Word Reading Neuropsychological Assessment Battery  List Learning  Story Learning  Daily Living Memory  Naming  Digit Span Repeatable Battery for Elizabeth Assessment of Neuropsychological Status (Form A)  Figure Copy  Judgment of Line Orientation  Coding  Figure Recall Elizabeth Dot Counting Test A Random Letter Test Controlled Oral Word Association (F-A-S) Semantic Fluency (Animals) Trail Making Test A & B Complex Ideational Material Modified [place] Card Sorting Test Geriatric Depression Scale - Short Form Quick Dementia Rating System (completed by daughter, Elizabeth Mcintyre)  Plan  Elizabeth Mcintyre was seen for a psychiatric diagnostic evaluation and neuropsychological testing. She is a 80 year old, right-hand dominant, widowed woman with a history of HTN, carotid artery stenosis, CKDIII, marginal zone lymphoma and memory and thinking problems. On interview she and her daughter report that she has had changes over Elizabeth past 2 years, at which point she was getting lost going to her daughters. They reported that she is still managing finances although she has had some questionable judgment and she is starting to have problems with medications. Imaging shows asymmetric, right predominate volume loss in Elizabeth hippocampo-amygdalar complex. Interestingly, she also reports very frequent deja vu, which has been hypothesized to involve parahippocampal and hippocampal mechanisms. Full and  complete note with impressions, recommendations, and interpretation of test data to follow.   [name], PsyD, [place] Clinical Neuropsychologist  Informed Consent  Risks and benefits of Elizabeth evaluation were discussed with Elizabeth Mcintyre prior to all testing procedures. I conducted a clinical interview and neuropsychological testing (at least two tests) with Elizabeth Mcintyre and Milana Kidney, B.S. (Technician) administered additional test procedures. Elizabeth Mcintyre was able to tolerate Elizabeth testing procedures and Elizabeth Mcintyre (and/or family if applicable) is likely to benefit from further follow up to receive Elizabeth diagnosis and treatment recommendations, which will be rendered at Elizabeth next encounter.

## 2021-03-27 NOTE — Progress Notes (Signed)
   Psychometrist Note   Cognitive testing was administered to Elizabeth Mcintyre by Milana Kidney, B.S. (Technician) under the supervision of Alphonzo Severance, Psy.D., ABN. [name] was able to tolerate all test procedures. Dr. Nicole Kindred met with the patient as needed to manage any emotional reactions to the testing procedures. Rest breaks were offered.    The battery of tests administered was selected by Dr. Nicole Kindred with consideration to the patient's current level of functioning, the nature of her symptoms, emotional and behavioral responses during the interview, level of literacy, observed level of motivation/effort, and the nature of the referral question. This battery was communicated to the psychometrist. Communication between Dr. Nicole Kindred and the psychometrist was ongoing throughout the evaluation and Dr. Nicole Kindred was immediately accessible at all times. Dr. Nicole Kindred provided supervision to the technician on the date of this service, to the extent necessary to assure the quality of all services provided.    Elizabeth Mcintyre will return in approximately one week for an interactive feedback session with Dr. Nicole Kindred, at which time test performance, clinical impressions, and treatment recommendations will be reviewed in detail. The patient understands she can contact our office should she require our assistance before this time.   A total of 90 minutes of billable time were spent with Elizabeth Mcintyre by the technician, including test administration and scoring time. Billing for these services is reflected in Dr. Les Pou note.   This note reflects time spent with the psychometrician and does not include test scores, clinical history, or any interpretations made by Dr. Nicole Kindred. The full report will follow in a separate note.

## 2021-03-28 NOTE — Progress Notes (Signed)
[facility]  Patient Name: Elizabeth Mcintyre MRN: [id number] Date of Birth: 07/09/1941 Age: 80 y.o. Education: 12 years  Measurement properties of test scores: IQ, Index, and Standard Scores (SS): Mean = 100; Standard Deviation = 15 Scaled Scores (Ss): Mean = 10; Standard Deviation = 3 Z scores (Z): Mean = 0; Standard Deviation = 1 T scores (T); Mean = 50; Standard Deviation = 10  TEST SCORES:    Note: This summary of test scores accompanies the interpretive report and should not be interpreted by unqualified individuals or in isolation without reference to the report. Test scores are relative to age, gender, and educational history as available and appropriate.   Performance Validity            "A" Random Letter Test Raw  Descriptor      Errors 0 Within Expectation  The Dot Counting Test: 10 Within Expectation  NAB Effort Index 3 Below Expectation      Mental Status Screening     Total Score Descriptor  MMSE 25 MCI      Expected Functioning        Wide Range Achievement Test: Standard/Scaled Score Percentile      Word Reading 85 16      Attention/Processing Speed        Neuropsychological Assessment Battery (Attention Module, Form 1): T-score Percentile      Digits Forward 60 84      Digits Backwards 52 58      Repeatable Battery for the Assessment of Neuropsychological Status (Form A): Scaled Score Percentile      Coding 11 63      Language        Neuropsychological Assessment Battery (Language Module, Form 1): T-score Percentile      Naming   (15) 19 <1      Verbal Fluency: T-score Percentile      Controlled Oral Word Association (F-A-S) 47 38      Semantic Fluency (Animals) 34 5      Memory:        Neuropsychological Assessment Battery (Memory Module, Form 1): T-score Percentile      List Learning           List A Immediate Recall   (3, 4, 6) 35 7         List B Immediate Recall   (0) 25 1         List A Short  Delayed Recall   (3) 36 8         List A Long Delayed Recall   (2) 34 5         List A Percent Retention   (67 %) --- 21         List A Long Delayed Yes/No Recognition Hits   (8) --- 8         List A Long Delayed Yes/No Recognition False Alarms   (7) --- 27         List A Recognition Discriminability Index --- 8      Story Learning           Immediate Recall   (9, 17) 25 1         Delayed Recall   (0) 30 2         Percent Retention   (0 %) --- <1      Daily Living Memory  Immediate Recall   (20, 20) 50 50          Delayed Recall   (0, 1) 19 <1          Percent Retention (6 %) --- <1          Recognition Hits    (6) --- 5      Repeatable Battery for the Assessment of Neuropsychological Status (Form A): Scaled Score Percentile         Figure Recall   (3) 3 1      Visuospatial/Constructional Functioning        Repeatable Battery for the Assessment of Neuropsychological Status (Form A): Standard/Scaled Score Percentile     Visuospatial/Constructional Index 100 50         Figure Copy   (20) 14 91         Judgment of Line Orientation   (12) --- 3-9      Executive Functioning        [employer] Test (MWCST): Standard/T-Score Percentile      Number of Categories Correct 42 21      Number of Perseverative Errors 42 21      Number of Total Errors 41 18      Percent Perseverative Errors 51 54  Executive Function Composite 87 19      Trail Making Test: T-Score Percentile      Part A 38 12      Part B DC DC      [place] Diagnostic Aphasia Exam: Raw Score Scaled Score      Complex Ideational Material 11 9      Clock Drawing Raw Score Descriptor      Command 6 Mild Impairment      Rating Scales        Clinical Dementia Rating Raw Score Descriptor      Sum of Boxes 3.5 Very Mild Dementia      Global Score 0.5 MCI      Quick Dementia Rating System Raw Score Descriptor      Sum of Boxes 5.5 Mild Dementia      Total Score 9.5 Mild Dementia  Geriatric  Depression Scale - Short Form 5 Negative    Babette Stum V. Nicole Kindred PsyD, [place] Clinical Neuropsychologist

## 2021-03-29 NOTE — Progress Notes (Signed)
[facility]  Patient Name: Elizabeth Mcintyre MRN: [id number] Date of Birth: 28-Jan-1941 Age: 80 y.o. Education: 38 years  Clinical Impressions  Elizabeth Mcintyre is a 80 y.o., right-hand dominant, widowed woman with a history of HTN, carotid artery stenosis, CKDIII, marginal zone lymphoma, and memory and thinking problems over the past 2 years or so. The first symptom noticed was that she started getting lost when she would come to visit her daughter, she misplaces things, and she does have some forgetting. She has issues with attention and concentration and seems distracted in conversations. She is still managing her own finances although she is highly regimented and utilizes a book to do so, which is a great source of stress according to her daughter. She does have difficulties with self-administration of medications.   On neuropsychological testing, there is suggestion of markedly deficient visual object confrontation naming, diminished semantic fluency, and possible memory storage problems. This is with the caveat that she did retain some information across time and as such, her memory is relatively less affected than semantic capacities, which is somewhat atypical. She performed adequately on measures of visuospatial function, processing speed, and attention/working memory, while she had scattered low scores on measures of executive function. Her daughter rates her as functioning at a mild dementia level, her CDR falls at a very mild dementia level, although I wonder if there may have been some underreporting in the presence of the patient.   [name] is thus demonstrating cognitive impairment amounting to a dementia level problem. While she does have memory impairment and naming problems, her memory is relatively better preserved than typical for AD at this point in the illness. 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 I think it is clear that this is neurodegenerative, and thus that there will be progression across time. If diagnostic clarity is a goal of care, LP for CSF biomarkers could be considered, although this wouldn't necessarily change her prognosis. A trial of antidementia medication would also be appropriate. I have no major concerns about the patient's driving at this point in time but would recommend that make use of GPS or other safeguards and/or limit her driving to local trips given her difficulties with navigation, which are particularly common in rtvFTD. She can return for reevaluation in a year or two.   Diagnostic Impressions: Mild dementia, unspecified dementia type  Recommendations to be discussed with patient  Your performance and presentation on assessment were consistent with impairment on measures of language involving semantic cognitive skills (i.e., knowledge about the world). You also had memory problems but they were relatively less pronounced. This is an interesting pattern of cognitive problems that is not typical but is certainly not unheard of. I do think that dementia is the best term for your level of cognitive problem.   Dementia refers to a group of syndromes where multiple areas of ability are damaged in the brain, such as memory, thinking, judgment, and behavior, and most commonly refers to age related causes of dementia that cause worsening in these abilities over time. Alzheimer's disease is the most common form of dementia in people over the  age of 51. Not all dementias are due to Alzheimer's disease, but all Alzheimer's  disease eventually results dementia. When dementia is due to an underlying condition affecting the brain such as Alzheimer's disease, there is progression over time, which typically proceeds gradually over many years.   In your case, I think that your dementia is likely due to a neurodegenerative condition, which means there will be progression across time. Neurodegenerative conditions are diseases that most often involve abnormal accumulation of protein in the brain that then causes damage to brain structures.   The most common cause of dementia by far in individuals your age is Alzheimer's, which often involves semantic problems although it also typically involves early and significant memory problems. You do have memory problems but they are less severe than I might expect. You also have relatively more volume loss in the right as compared to the left temporal lobe. 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 It is not a formal diagnosis. This could be an atypical presentation of Alzheimer's disease but there is a differential. Pathologies involving TDP protein and sometimes also Tau protein that generally fall under the Frontotemporal Dementia umbrella are more commonly associated with the type of presentation you are demonstrating.   The best test, if diagnostic clarity is a goal of care for you, would probably be a lumbar puncture for CSF biomarkers. If this were negative then we would have confidence that your difficulties are due to something other than AD, most likely TDP or Tau. If it were positive then it is possible that Alzheimer's is the cause of your problem. I would like to underscore that you may not need that, however, because it would not substantially alter your prognosis.   The mainstay of treatment for  individuals with dementia is assistance as needed with activities of daily living. This usually includes complex things such as managing finances in the early stages. As the disease progresses, people may need increasing assistance with things such as venturing out into the community to get food or other necessary items, with meal planning, keeping of personal effects and the like. Individuals with dementia typically need assistance with any memory dependent activities such as remembering to take medications, keeping track of doctors appointments, and other social engagements. The more these things can be built into routines that are simple, predictable and easy to follow, the better. Activities with a high risk of adverse outcomes (e.g., driving) need to be carefully monitored, because at some point in the disease progression, it becomes unsafe to drive.   In your case, it sounds like you are doing well with most things but I would recommend that you get an automated pill minder, have your son carefully check medications, or otherwise make sure that you are taking them appropriate. I would also recommend that your daughter continue to double check your finances.   I would recommend that you drive only locally and that you learn to use compensatory strategies such as GPS, do not use an unfamiliar vehicle such as a rental car or a loner. Drive in familiar areas. Driving and navigation are often impaired in the so-called "right temporal variant of frontotemporal dementia."  The following compensatory strategies may be helpful for managing day-to-day memory symptoms: Minimize distractions and interruptions to the extent possible. Be an active observer, present-minded, and  focus attention. Focus on only one task for a period of time.  Get organized. Establish routines and stick to them. Make and use checklists.  Use external memory aids as needed, such as a planner and notebook. Repetition, written reminders,  and keeping a calendar of appointments may be helpful. Designate a place to keep your keys, wallet, cell phone, and other personal belongings.  Break down tasks into smaller steps to help get started and to keep from feeling overwhelmed.  Increase your success learning information by breaking it into manageable chunks, connecting it to previously learned information, or forming associations with what you are trying to remember.   Test Findings  Test scores are summarized in additional documentation associated with this encounter. Test scores are relative to age, gender, and educational history as available and appropriate. There were no concerns about performance validity as all findings fell within normal expectations.   General Intellectual Functioning/Achievement:  Performance on single word reading was low average. This may be a reasonable estimate of premorbid function, although it is also possible that this represents some mild decline owing to this patient's semantic difficulties, which can at times affect reading. Average to low average present as reasonable standards of comparison for the patient's cognitive test performance.   Attention and Processing Efficiency: Performance on indicators of attention and working memory was good with high average digit repetiton forward and average digit repetition backward. Timed number-symbol coding was average.   Language: Indicators of language function showed semantic issues, with markedly deficient visual object confrontation naming. She correctly retrieved many of these words when provided with phonemic cues, suggesting this is not a premorbid knowledge problem. The patient did not have any qualitative comprehension difficulties and correctly answered 4/4 sets of yes/no questions on a measure of reasoning with verbal information. Generation of words was unusually low in response to the category prompt "animals," whereas she did better generating words  in response to the letters F-A-S.   Visuospatial Function: Performance on visuospatial and constructional measures was within the average range. She did have an unusually low score on judgment of angular line orientations, although her figure copy was very good and errorless. It's possible she may have some subtle perceptual problems given the line orientation score but I am not entirely convinced.   Learning and Memory: Performance on measures of learning and memory was notable for difficulties with initial encoding of information in most cases and then difficulties retaining information across time in many cases. She did retain a bit of information, however, and as such the findings may suggest relative preservation as compared to expectations in the setting of typical Alzheimer's clinical syndrome.   In the verbal realm, she learned 3, 4, and 6 words of a 12-item word list, which is unusually low. She retained 3 words on short delayed recall and 2 words on long-delayed recall, which are unusually low scores. Recognition discriminability for the words from the list versus foils was comparable. Performance on short story learning was extremely low, I question if her semantic difficulties may have made parsing this structured verbal information somewhat more difficult. Delayed recall was impaired, she did not retain any information. Memory for brief daily living type information was good on immediate recall but then delayed recall was extremely low with virtually no information retained. Recognition discriminability was unusually low.   Performance recalling a modestly complex geometric figure was extremely low with minimal details recalled.   Executive Functions: Performance on executive measures was mixed. She  had a very hard time on alternating sequencing of numbers and letters of the alphabet, which was not administered because she did not complete the sample despite very heavy coaching. She also  had a hard time with clock drawing, with errors in spatial arrangement of the numbers and poor representation of two hands, which were markedly out of course. She then did better on [employer], which generated a low average executive function composite score. She also scored at an average level when generating words on the basis of letter cues. Answering a series of yes/no questions requiring reasoning with factual information was average.   Rating Scale(s): Ms. Shambaugh was characterized as functioning at a mild dementia level by her daughter on severity status staging. I was able to rate a CDR for her and placed her in the very mild dementia range, considering that I do think she meets clinical criteria for dementia. I think there may have been some mild underreporting during the interview by her daughter. She screened negative for the presence of depression.   [name], PsyD, ABN Clinical Neuropsychologist  Coding and Compliance  Billing below reflects technician time, my direct face-to-face time with the patient, time spent in test administration, and time spent in professional activities including but not limited to: neuropsychological test interpretation, integration of neuropsychological test data with clinical history, report preparation, treatment planning, care coordination, and review of diagnostically pertinent medical history or studies.   Services associated with this encounter: Clinical Interview [phone]) plus 135minutes (96132/96133; Neuropsychological Evaluation by Professional)  90 minutes ([id number]/[id number]; Neuropsychological Testing by Technician)

## 2021-04-03 ENCOUNTER — Ambulatory Visit (INDEPENDENT_AMBULATORY_CARE_PROVIDER_SITE_OTHER): Payer: Medicare Other | Admitting: Counselor

## 2021-04-03 ENCOUNTER — Other Ambulatory Visit: Payer: Self-pay

## 2021-04-03 DIAGNOSIS — F0390 Unspecified dementia without behavioral disturbance: Secondary | ICD-10-CM

## 2021-04-03 DIAGNOSIS — F03A0 Unspecified dementia, mild, without behavioral disturbance, psychotic disturbance, mood disturbance, and anxiety: Secondary | ICD-10-CM

## 2021-04-03 NOTE — Patient Instructions (Signed)
Your performance and presentation on assessment were consistent with impairment on measures of language involving semantic cognitive skills (i.e., knowledge about the world). You also had memory problems but they were relatively less pronounced. This is an interesting pattern of cognitive problems that is not typical but is certainly not unheard of. I do think that dementia is the best term for your level of cognitive problem.   Dementia refers to a group of syndromes where multiple areas of ability are damaged in the brain, such as memory, thinking, judgment, and behavior, and most commonly refers to age related causes of dementia that cause worsening in these abilities over time. Alzheimer's disease is the most common form of dementia in people over the age of 36. Not all dementias are due to Alzheimer's disease, but all Alzheimer's disease eventually results dementia. When dementia is due to an underlying condition affecting the brain such as Alzheimer's disease, there is progression over time, which typically proceeds gradually over many years.   In your case, I think that your dementia is likely due to a neurodegenerative condition, which means there will be progression across time. Neurodegenerative conditions are diseases that most often involve abnormal accumulation of protein in the brain that then causes damage to brain structures.   The most common cause of dementia by far in individuals your age is Alzheimer's, which often involves semantic problems although it also typically involves early and significant memory problems. You do have memory problems but they are less severe than I might expect. You also have relatively more volume loss in the right as compared to the left temporal lobe. 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 It is not a formal diagnosis. This could be an atypical presentation of Alzheimer's disease but there is a differential. Pathologies involving TDP protein and sometimes also Tau protein that generally fall under the Frontotemporal Dementia umbrella are more commonly associated with the type of presentation you are demonstrating.   The best test, if diagnostic clarity is a goal of care for you, would probably be a lumbar puncture for CSF biomarkers. If this were negative then we would have confidence that your difficulties are due to something other than AD, most likely TDP or Tau. If it were positive then it is possible that Alzheimer's is the cause of your problem. I would like to underscore that you may not need that, however, because it would not substantially alter your prognosis.   The mainstay of treatment for individuals with dementia is assistance as needed with activities of daily living. This usually includes complex things such as managing finances in the early stages. As the disease progresses, people may need increasing assistance with things such as venturing out into the community to get food or other necessary items, with meal planning, keeping of personal effects and the like. Individuals with dementia typically need assistance with any memory dependent activities such as remembering to take medications, keeping track of doctors appointments, and other social engagements. The more these things can be built into routines that are simple, predictable and easy to follow, the better. Activities with a high risk of adverse outcomes (e.g., driving) need to be carefully monitored, because at some point in the disease progression, it becomes unsafe to drive.   In your case, it sounds like you are doing well with  most things but I would recommend that you get an automated pill minder, have your son carefully check medications, or otherwise make sure that you are taking them appropriate.  I would also recommend that your daughter continue to double check your finances.   I would recommend that you drive only locally and that you learn to use compensatory strategies such as GPS, do not use an unfamiliar vehicle such as a rental car or a loner. Drive in familiar areas. Driving and navigation are often impaired in the so-called "right temporal variant of frontotemporal dementia."   The following compensatory strategies may be helpful for managing day-to-day memory symptoms: Minimize distractions and interruptions to the extent possible. Be an active observer, present-minded, and focus attention. Focus on only one task for a period of time. Get organized. Establish routines and stick to them. Make and use checklists. Use external memory aids as needed, such as a planner and notebook. Repetition, written reminders, and keeping a calendar of appointments may be helpful. Designate a place to keep your keys, wallet, cell phone, and other personal belongings. Break down tasks into smaller steps to help get started and to keep from feeling overwhelmed. Increase your success learning information by breaking it into manageable chunks, connecting it to previously learned information, or forming associations with what you are trying to remember.  As with all capable adults, I would recommend that you consult with an elder care attorney regarding advanced directives if you have not, such as a healthcare power of attorney and living will. Consultation with an elder care attorney can help you protect your estate and communicate your preferences to your loved ones formally, in the event you are not able to do so yourself. I can provide my strong recommendation for the [employer], who are located at [address] in [place], [place]. They can be reached at [phone]. They also have a website [email or web address].

## 2021-04-04 ENCOUNTER — Encounter: Payer: Self-pay | Admitting: Counselor

## 2021-04-04 NOTE — Progress Notes (Signed)
NEUROPSYCHOLOGY FEEDBACK NOTE [facility]  Feedback Note: I met with Elizabeth Mcintyre to review the findings resulting from her neuropsychological evaluation. She was accompanied by her daughter, Elizabeth Mcintyre. Since the last appointment, she has been about the same.Time was spent reviewing the impressions and recommendations that are detailed in the evaluation report. We discussed impression of very mild stage dementia, as reflected in the patient instructions. I was canded that I am confident this is neurodegenerative but apart from that, I am unsure what is causing her problem. We discussed the clinico-radiologic entity of "right temporal variant" FTD, Alzheimer's and other considerations. I reviewed her brain imaging and placed a referral to Alzheimer's association with their consent. I took time to explain the findings and answer all the patient's questions. I encouraged Elizabeth Mcintyre to contact me should she have any further questions or if further follow up is desired.   Current Medications and Medical History   Current Outpatient Medications  Medication Sig Dispense Refill   acetaminophen (TYLENOL) 325 MG tablet Take 325 mg by mouth every 6 (six) hours as needed for moderate pain.      albuterol (VENTOLIN HFA) 108 (90 Base) MCG/ACT inhaler Inhale 2 puffs into the lungs every 6 (six) hours as needed for wheezing. 8.5 g 2   amLODipine (NORVASC) 10 MG tablet Take 1 tablet (10 mg total) by mouth daily. 90 tablet 1   Ascorbic Acid (VITAMIN C) 100 MG tablet Take 100 mg by mouth daily.     cetirizine (ZYRTEC) 10 MG tablet Take 10 mg by mouth at bedtime. As needed     Cholecalciferol (VITAMIN D3) 25 MCG (1000 UT) CAPS Take 1,000 Units by mouth daily.      donepezil (ARICEPT) 10 MG tablet Take 1 tablet daily 30 tablet 11   levothyroxine (SYNTHROID) 75 MCG tablet Take 1 tablet (75 mcg total) by mouth daily. 30 tablet 3   lidocaine-prilocaine (EMLA) cream Apply 1 application topically as needed.  (Patient taking differently: Apply 1 application topically as needed (port access).) 30 g 1   pantoprazole (PROTONIX) 40 MG tablet Take 1 tablet (40 mg total) by mouth daily. 90 tablet 3   potassium chloride SA (KLOR-CON) 20 MEQ tablet Take 1 tablet (20 mEq total) by mouth daily. 21 tablet 0   rOPINIRole (REQUIP) 0.25 MG tablet Take 1 tablet (0.25 mg total) by mouth at bedtime. 90 tablet 1   rosuvastatin (CRESTOR) 20 MG tablet Take 1 tablet (20 mg total) by mouth daily. 90 tablet 3   sucralfate (CARAFATE) 1 g tablet Take 1 g by mouth 4 (four) times daily -  with meals and at bedtime. As needed     vitamin B-12 (CYANOCOBALAMIN) 1000 MCG tablet Take 1,000 mcg by mouth daily.      No current facility-administered medications for this visit.    Patient Active Problem List   Diagnosis Date Noted   Restless legs syndrome 01/02/2021   Prediabetes 08/14/2020   Mixed hyperlipidemia 08/14/2020   Active chemotherapy 04/30/2020   Dysphagia 04/18/2020   Port-A-Cath in place 03/28/2020   Marginal zone lymphoma ([place]) 03/01/2020   Hepatic steatosis 01/04/2020   Splenomegaly 01/04/2020   Thrombocytopenia ([place]) 01/04/2020   Diarrhea 09/10/2018   Carotid artery stenosis 06/13/2016   Stage 3a chronic kidney disease ([place]) 02/14/2016   Vitamin D deficiency 05/25/2015   Gastroesophageal reflux disease with esophagitis 03/06/2015   Acquired hypothyroidism 05/30/2014   Osteopenia 12/02/2013   Major depression, recurrent, chronic ([place]) 04/03/2011  Mild intermittent asthma 11/09/2007   Essential hypertension, benign 11/09/2007    Mental Status and Behavioral Observations  Elizabeth Mcintyre presented on time to the present encounter and was alert and generally oriented. Speech was normal in rate, rhythm, volume, and prosody. Self-reported mood was "fine" and affect was neutral. Thought process was difficult to assess as she was minimally interactive throughout the encounter. Thought content was appropriate to  the topics discussed. There were no safety concerns identified at today's encounter, such as thoughts of harming self or others.   Plan  Feedback provided regarding the patient's neuropsychological evaluation. The patient's daughter is very engaged and wanted a lot of information. I encouraged her to get a copy of 36 hour day, provided her with materials from [place] on caregiving in dementia, and also referred to Alzheimer's association. Encouraged the patient to return in 1 year or so for updated evaluation. Elizabeth Mcintyre was encouraged to contact me if any questions arise or if further follow up is desired.   Elizabeth Mcintyre Elizabeth Kindred, PsyD, ABN Clinical Neuropsychologist  Service(s) Provided at This Encounter: 60 minutes [phone]; Psychotherapy with patient/family)

## 2021-04-08 ENCOUNTER — Telehealth: Payer: Self-pay | Admitting: Internal Medicine

## 2021-04-08 NOTE — Telephone Encounter (Signed)
Left message for patient to call back and schedule Medicare Annual Wellness Visit (AWV). Please offer to do virtually or by telephone.   Last AWV:09/29/2018  Please schedule at anytime with Nurse Health Advisor.

## 2021-04-09 ENCOUNTER — Other Ambulatory Visit: Payer: Self-pay

## 2021-04-09 ENCOUNTER — Ambulatory Visit
Admission: RE | Admit: 2021-04-09 | Discharge: 2021-04-09 | Disposition: A | Discharge status: Home / Self Care | Payer: Medicare Other | Source: Ambulatory Visit | Attending: Family Medicine | Admitting: Family Medicine

## 2021-04-09 ENCOUNTER — Encounter: Payer: Self-pay | Admitting: Internal Medicine

## 2021-04-09 DIAGNOSIS — Z1231 Encounter for screening mammogram for malignant neoplasm of breast: Secondary | ICD-10-CM | POA: Diagnosis not present

## 2021-04-09 IMAGING — MG MM DIGITAL SCREENING BILAT W/ TOMO AND CAD
6 of 10 series · 6 of 30 positions shown · non-contrast
Comparison: Previous exam(s).

CLINICAL DATA: Screening.

EXAM:
DIGITAL SCREENING BILATERAL MAMMOGRAM WITH TOMOSYNTHESIS AND CAD
TECHNIQUE: Bilateral screening digital craniocaudal and mediolateral oblique
mammograms were obtained. Bilateral screening digital breast
tomosynthesis was performed. The images were evaluated with
computer-aided detection.

[L CC synth-2D]
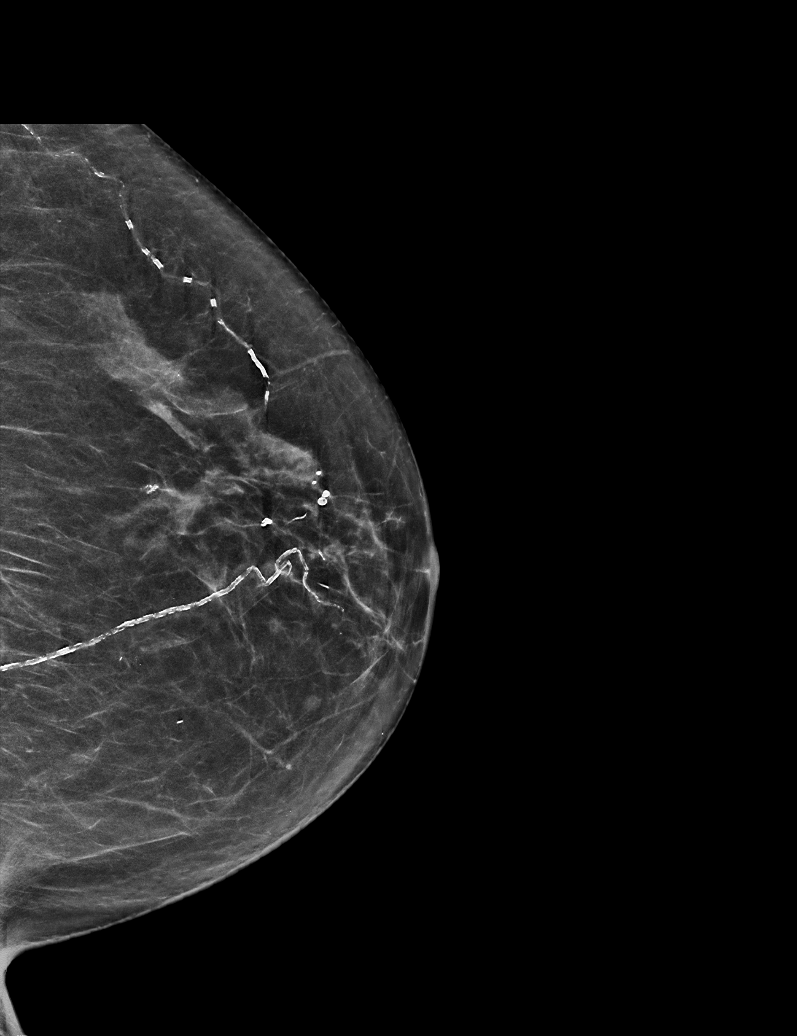

[L MLO synth-2D]
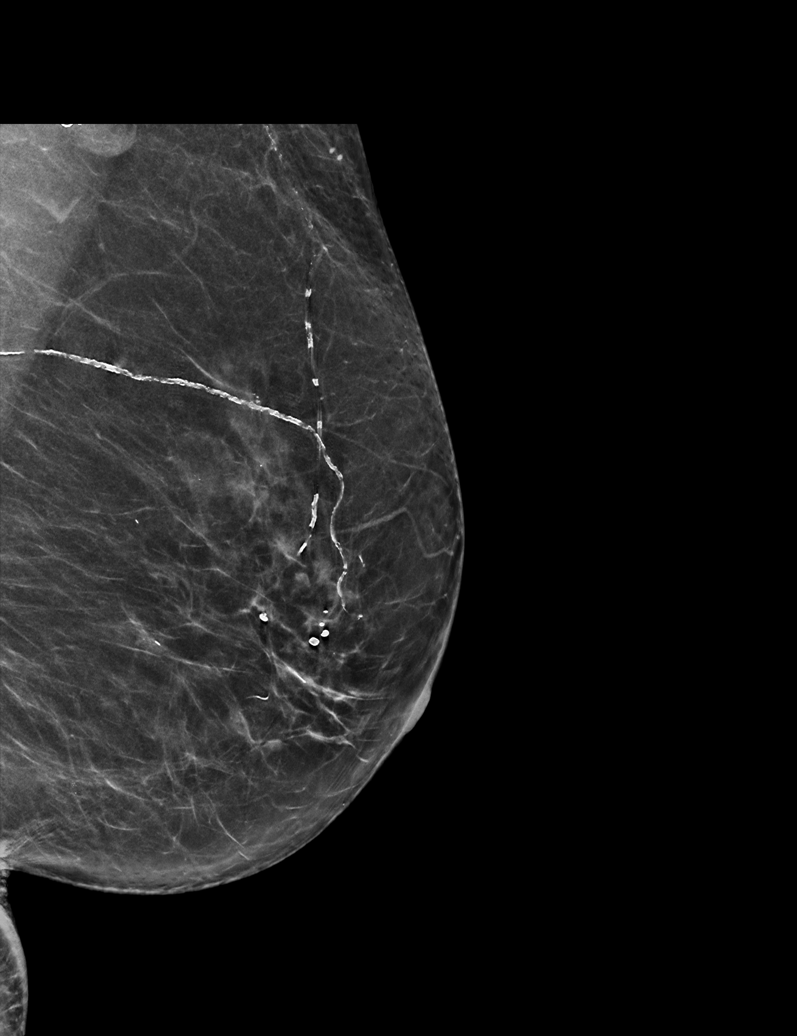

[R CC synth-2D]
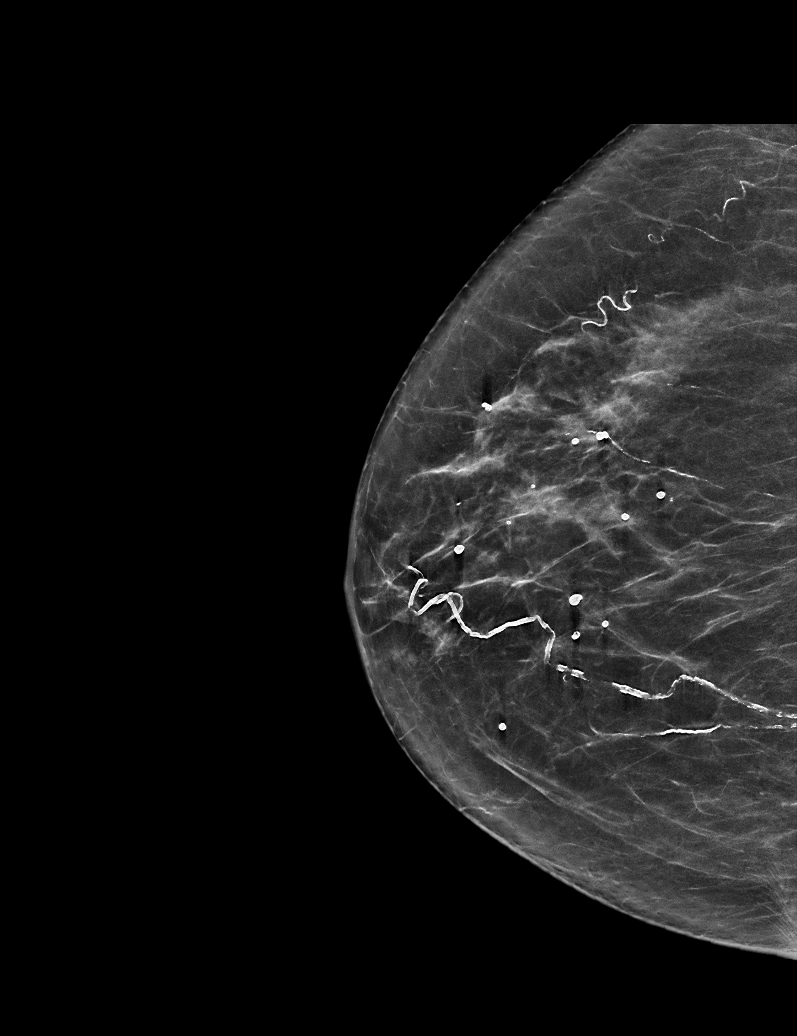

[R MLO synth-2D (1 of 2)]
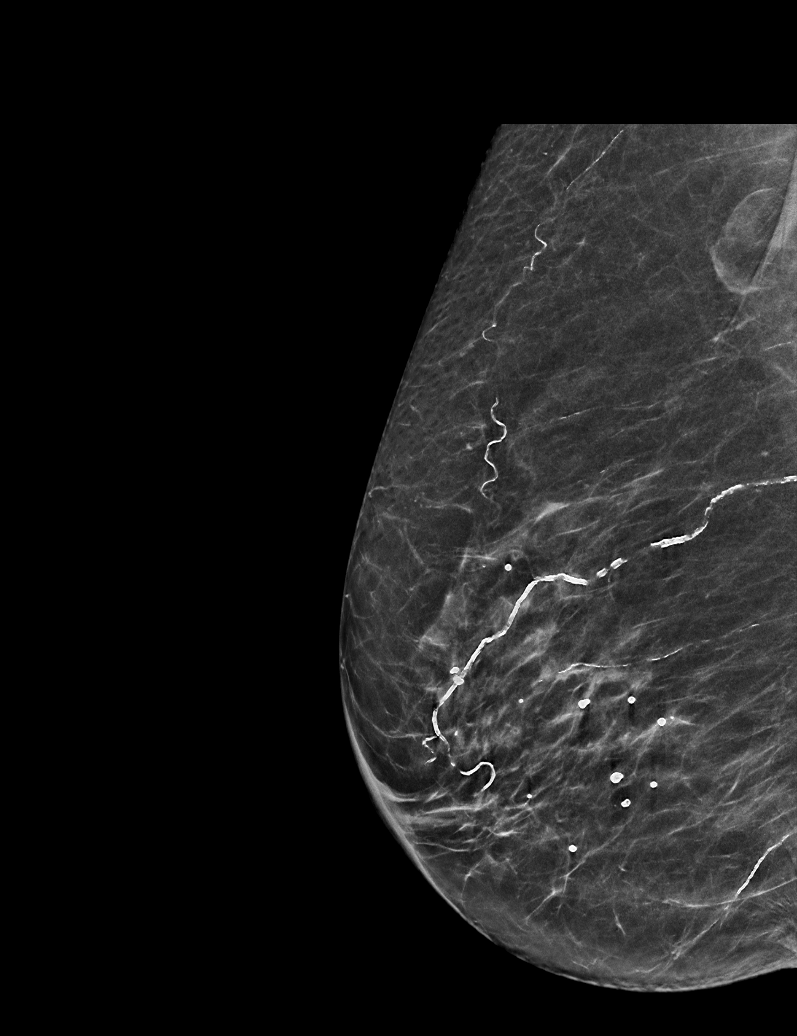

[R MLO synth-2D (2 of 2)]
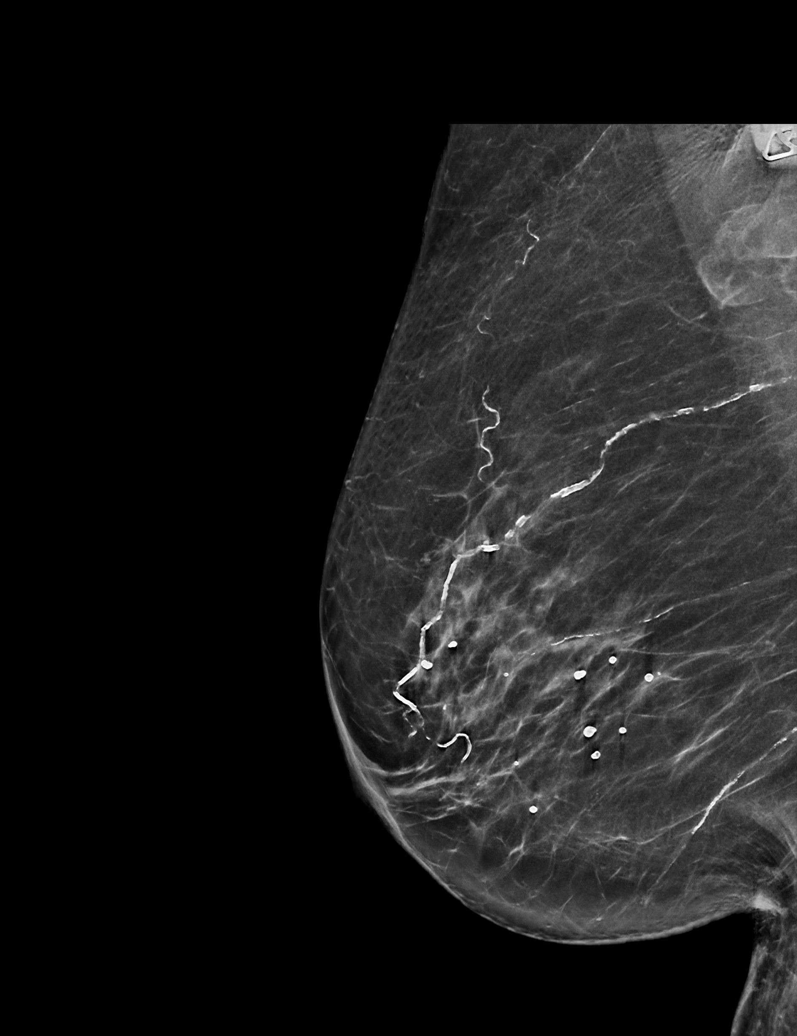

[L CC tomo · tomo slice 29/57.0]
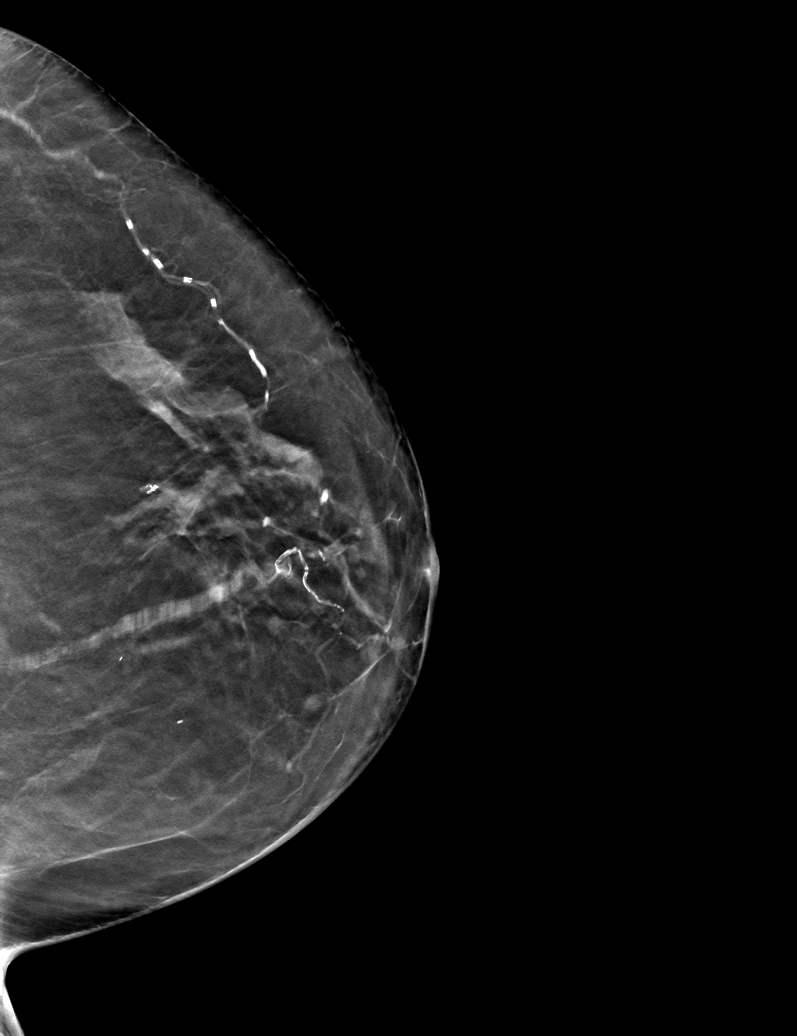

[6 of 30 positions shown; findings below may reference images not displayed]

ACR Breast Density Category b: There are scattered areas of
fibroglandular density.
FINDINGS: There are no findings suspicious for malignancy.
IMPRESSION: No mammographic evidence of malignancy. A result letter of this
screening mammogram will be mailed directly to the patient.

RECOMMENDATION:
Screening mammogram in one year. (Code:[BY])

BI-RADS CATEGORY  1: Negative.

## 2021-04-11 ENCOUNTER — Telehealth: Payer: Self-pay | Admitting: Family Medicine

## 2021-04-11 NOTE — Telephone Encounter (Signed)
Patient's daughter called back for Mammo results and I relayed Dr. Lucita Lora message: Please inform patient her mammogram is normal. Daughter cheerfully voiced understanding. Daughter also stated [facility] has a new PCP and she will find out why the results came to [place] instead of going to new PCP.

## 2021-04-11 NOTE — Telephone Encounter (Signed)
Noted  

## 2021-05-06 ENCOUNTER — Ambulatory Visit: Payer: Medicare Other | Admitting: Family Medicine

## 2021-05-09 ENCOUNTER — Inpatient Hospital Stay: Payer: Medicare Other | Attending: Hematology and Oncology

## 2021-05-09 ENCOUNTER — Inpatient Hospital Stay: Payer: Medicare Other

## 2021-05-09 ENCOUNTER — Other Ambulatory Visit: Payer: Self-pay

## 2021-05-09 ENCOUNTER — Other Ambulatory Visit: Payer: Self-pay | Admitting: Hematology and Oncology

## 2021-05-09 ENCOUNTER — Inpatient Hospital Stay: Payer: Medicare Other | Admitting: Hematology and Oncology

## 2021-05-09 VITALS — BP 160/61 | HR 98 | Temp 98.4°F | Resp 18 | Ht 62.0 in | Wt 127.0 lb

## 2021-05-09 VITALS — BP 125/55 | HR 76 | Temp 97.8°F | Resp 18

## 2021-05-09 DIAGNOSIS — Z5112 Encounter for antineoplastic immunotherapy: Secondary | ICD-10-CM | POA: Insufficient documentation

## 2021-05-09 DIAGNOSIS — C8580 Other specified types of non-Hodgkin lymphoma, unspecified site: Secondary | ICD-10-CM | POA: Diagnosis not present

## 2021-05-09 DIAGNOSIS — C8514 Unspecified B-cell lymphoma, lymph nodes of axilla and upper limb: Secondary | ICD-10-CM | POA: Diagnosis not present

## 2021-05-09 DIAGNOSIS — Z95828 Presence of other vascular implants and grafts: Secondary | ICD-10-CM

## 2021-05-09 LAB — CMP (CANCER CENTER ONLY)
ALT: 15 U/L (ref 0–44)
AST: 15 U/L (ref 15–41)
Albumin: 4.2 g/dL (ref 3.5–5.0)
Alkaline Phosphatase: 78 U/L (ref 38–126)
Anion gap: 11 (ref 5–15)
BUN: 30 mg/dL — ABNORMAL HIGH (ref 8–23)
CO2: 24 mmol/L (ref 22–32)
Calcium: 9.7 mg/dL (ref 8.9–10.3)
Chloride: 106 mmol/L (ref 98–111)
Creatinine: 1.21 mg/dL — ABNORMAL HIGH (ref 0.44–1.00)
GFR, Estimated: 46 mL/min — ABNORMAL LOW (ref >60)
Glucose, Bld: 115 mg/dL — ABNORMAL HIGH (ref 70–99)
Potassium: 3.8 mmol/L (ref 3.5–5.1)
Sodium: 141 mmol/L (ref 135–145)
Total Bilirubin: 0.5 mg/dL (ref 0.3–1.2)
Total Protein: 6.9 g/dL (ref 6.5–8.1)

## 2021-05-09 LAB — CBC WITH DIFFERENTIAL (CANCER CENTER ONLY)
Abs Immature Granulocytes: 0.07 10*3/uL (ref 0.00–0.07)
Basophils Absolute: 0 10*3/uL (ref 0.0–0.1)
Basophils Relative: 0 %
Eosinophils Absolute: 0.5 10*3/uL (ref 0.0–0.5)
Eosinophils Relative: 3 %
HCT: 36.2 % (ref 36.0–46.0)
Hemoglobin: 12.8 g/dL (ref 12.0–15.0)
Immature Granulocytes: 1 %
Lymphocytes Relative: 16 %
Lymphs Abs: 2.4 10*3/uL (ref 0.7–4.0)
MCH: 29.9 pg (ref 26.0–34.0)
MCHC: 35.4 g/dL (ref 30.0–36.0)
MCV: 84.6 fL (ref 80.0–100.0)
Monocytes Absolute: 1 10*3/uL (ref 0.1–1.0)
Monocytes Relative: 7 %
Neutro Abs: 10.6 10*3/uL — ABNORMAL HIGH (ref 1.7–7.7)
Neutrophils Relative %: 73 %
Platelet Count: 171 10*3/uL (ref 150–400)
RBC: 4.28 MIL/uL (ref 3.87–5.11)
RDW: 13.6 % (ref 11.5–15.5)
WBC Count: 14.5 10*3/uL — ABNORMAL HIGH (ref 4.0–10.5)
nRBC: 0 % (ref 0.0–0.2)

## 2021-05-09 LAB — LACTATE DEHYDROGENASE: LDH: 242 U/L — ABNORMAL HIGH (ref 98–192)

## 2021-05-09 MED ORDER — ACETAMINOPHEN 325 MG PO TABS
650.0000 mg | ORAL_TABLET | Freq: Once | ORAL | Status: AC
  Administered 2021-05-09: 650 mg via ORAL
  Filled 2021-05-09: qty 2

## 2021-05-09 MED ORDER — SODIUM CHLORIDE 0.9 % IV SOLN: Freq: Once | INTRAVENOUS | Status: AC

## 2021-05-09 MED ORDER — SODIUM CHLORIDE 0.9 % IV SOLN
375.0000 mg/m2 | Freq: Once | INTRAVENOUS | Status: AC
  Administered 2021-05-09: 600 mg via INTRAVENOUS
  Filled 2021-05-09: qty 50

## 2021-05-09 MED ORDER — HEPARIN SOD (PORK) LOCK FLUSH 100 UNIT/ML IV SOLN
500.0000 [IU] | Freq: Once | INTRAVENOUS | Status: AC | PRN
  Administered 2021-05-09: 500 [IU]

## 2021-05-09 MED ORDER — FAMOTIDINE 20 MG IN NS 100 ML IVPB
20.0000 mg | Freq: Once | INTRAVENOUS | Status: AC
  Administered 2021-05-09: 20 mg via INTRAVENOUS
  Filled 2021-05-09: qty 100

## 2021-05-09 MED ORDER — SODIUM CHLORIDE 0.9% FLUSH
10.0000 mL | INTRAVENOUS | Status: DC | PRN
  Administered 2021-05-09: 10 mL via INTRAVENOUS

## 2021-05-09 MED ORDER — FAMOTIDINE IN NACL 20-0.9 MG/50ML-% IV SOLN
20.0000 mg | Freq: Once | INTRAVENOUS | Status: DC
  Filled 2021-05-09: qty 50

## 2021-05-09 MED ORDER — DIPHENHYDRAMINE HCL 25 MG PO CAPS
50.0000 mg | ORAL_CAPSULE | Freq: Once | ORAL | Status: AC
  Administered 2021-05-09: 50 mg via ORAL
  Filled 2021-05-09: qty 2

## 2021-05-09 MED ORDER — SODIUM CHLORIDE 0.9% FLUSH
10.0000 mL | INTRAVENOUS | Status: DC | PRN
  Administered 2021-05-09: 10 mL

## 2021-05-09 NOTE — Patient Instructions (Signed)
[facility]  Discharge Instructions: Thank you for choosing [facility] to provide your oncology and hematology care.   If you have a lab appointment with the [facility], please go directly to the [facility] and check in at the registration area.   Wear comfortable clothing and clothing appropriate for easy access to any Portacath or PICC line.   We strive to give you quality time with your provider. You may need to reschedule your appointment if you arrive late (15 or more minutes).  Arriving late affects you and other patients whose appointments are after yours.  Also, if you miss three or more appointments without notifying the office, you may be dismissed from the clinic at the provider's discretion.      For prescription refill requests, have your pharmacy contact our office and allow 72 hours for refills to be completed.    Today you received the following chemotherapy and/or immunotherapy agents Rituxan      To help prevent nausea and vomiting after your treatment, we encourage you to take your nausea medication as directed.  BELOW ARE SYMPTOMS THAT SHOULD BE REPORTED IMMEDIATELY: *FEVER GREATER THAN 100.4 F (38 C) OR HIGHER *CHILLS OR SWEATING *NAUSEA AND VOMITING THAT IS NOT CONTROLLED WITH YOUR NAUSEA MEDICATION *UNUSUAL SHORTNESS OF BREATH *UNUSUAL BRUISING OR BLEEDING *URINARY PROBLEMS (pain or burning when urinating, or frequent urination) *BOWEL PROBLEMS (unusual diarrhea, constipation, pain near the anus) TENDERNESS IN MOUTH AND THROAT WITH OR WITHOUT PRESENCE OF ULCERS (sore throat, sores in mouth, or a toothache) UNUSUAL RASH, SWELLING OR PAIN  UNUSUAL VAGINAL DISCHARGE OR ITCHING   Items with * indicate a potential emergency and should be followed up as soon as possible or go to the Emergency Department if any problems should occur.  Please show the CHEMOTHERAPY ALERT CARD or IMMUNOTHERAPY ALERT CARD at check-in to the  Emergency Department and triage nurse.  Should you have questions after your visit or need to cancel or reschedule your appointment, please contact [facility]  Dept: [phone]  and follow the prompts.  Office hours are 8:00 a.m. to 4:30 p.m. Monday - Friday. Please note that voicemails left after 4:00 p.m. may not be returned until the following business day.  We are closed weekends and major holidays. You have access to a nurse at all times for urgent questions. Please call the main number to the clinic Dept: [phone] and follow the prompts.   For any non-urgent questions, you may also contact your provider using MyChart. We now offer e-Visits for anyone 68 and older to request care online for non-urgent symptoms. For details visit [email or web address].   Also download the MyChart app! Go to the app store, search "MyChart", open the app, select [facility], and log in with your MyChart username and password.  Due to Covid, a mask is required upon entering the hospital/clinic. If you do not have a mask, one will be given to you upon arrival. For doctor visits, patients may have 1 support person aged 62 or older with them. For treatment visits, patients cannot have anyone with them due to current Covid guidelines and our immunocompromised population.   Rituximab Injection What is this medication? RITUXIMAB (ri TUX i mab) is a monoclonal antibody. It is used to treat certain types of cancer like non-Hodgkin lymphoma and chronic lymphocytic leukemia. It is also used to treat rheumatoid arthritis, granulomatosis with polyangiitis,microscopic polyangiitis, and pemphigus vulgaris. This medicine may  be used for other purposes; ask your health care provider orpharmacist if you have questions. COMMON BRAND NAME(S): RIABNI, Rituxan, RUXIENCE What should I tell my care team before I take this medication? They need to know if you have any of these conditions: chest  pain heart disease infection especially a viral infection such as chickenpox, cold sores, hepatitis B, or herpes immune system problems irregular heartbeat or rhythm kidney disease low blood counts (white cells, platelets, or red cells) lung disease recent or upcoming vaccine an unusual or allergic reaction to rituximab, other medicines, foods, dyes, or preservatives pregnant or trying to get pregnant breast-feeding How should I use this medication? This medicine is injected into a vein. It is given by a health care provider ina hospital or clinic setting. A special MedGuide will be given to you before each treatment. Be sure to readthis information carefully each time. Talk to your health care provider about the use of this medicine in children. While this drug may be prescribed for children as young as 6 months forselected conditions, precautions do apply. Overdosage: If you think you have taken too much of this medicine contact apoison control center or emergency room at once. NOTE: This medicine is only for you. Do not share this medicine with others. What if I miss a dose? Keep appointments for follow-up doses. It is important not to miss your dose.Call your health care provider if you are unable to keep an appointment. What may interact with this medication? Do not take this medicine with any of the following medicines: live vaccines This medicine may also interact with the following medicines: cisplatin This list may not describe all possible interactions. Give your health care provider a list of all the medicines, herbs, non-prescription drugs, or dietary supplements you use. Also tell them if you smoke, drink alcohol, or use illegaldrugs. Some items may interact with your medicine. What should I watch for while using this medication? Your condition will be monitored carefully while you are receiving thismedicine. You may need blood work done while you are taking this  medicine. This medicine can cause serious infusion reactions. To reduce the risk your health care provider may give you other medicines to take before receiving thisone. Be sure to follow the directions from your health care provider. This medicine may increase your risk of getting an infection. Call your health care provider for advice if you get a fever, chills, sore throat, or other symptoms of a cold or flu. Do not treat yourself. Try to avoid being aroundpeople who are sick. Call your health care provider if you are around anyone with measles,chickenpox, or if you develop sores or blisters that do not heal properly. Avoid taking medicines that contain aspirin, acetaminophen, ibuprofen, naproxen, or ketoprofen unless instructed by your health care provider. Thesemedicines may hide a fever. This medicine may cause serious skin reactions. They can happen weeks to months after starting the medicine. Contact your health care provider right away if you notice fevers or flu-like symptoms with a rash. The rash may be red or purple and then turn into blisters or peeling of the skin. Or, you might notice a red rash with swelling of the face, lips or lymph nodes in your neck or underyour arms. In some patients, this medicine may cause a serious brain infection that may cause death. If you have any problems seeing, thinking, speaking, walking, or standing, tell your healthcare professional right away. If you cannot reachyour healthcare professional, urgently seek other source  of medical care. Do not become pregnant while taking this medicine or for at least 12 months after stopping it. Women should inform their health care provider if they wish to become pregnant or think they might be pregnant. There is potential for serious harm to an unborn child. Talk to your health care provider for more information. Women should use a reliable form of birth control while taking this medicine and for 12 months after stopping  it. Do not breast-feed whiletaking this medicine or for at least 6 months after stopping it. What side effects may I notice from receiving this medication? Side effects that you should report to your health care provider as soon aspossible: allergic reactions (skin rash, itching or hives; swelling of the face, lips, or tongue) diarrhea edema (sudden weight gain; swelling of the ankles, feet, hands or other unusual swelling; trouble breathing) fast, irregular heartbeat heart attack (trouble breathing; pain or tightness in the chest, neck, back or arms; unusually weak or tired) infection (fever, chills, cough, sore throat, pain or trouble passing urine) kidney injury (trouble passing urine or change in the amount of urine) liver injury (dark yellow or brown urine; general ill feeling or flu-like symptoms; loss of appetite, right upper belly pain; unusually weak or tired, yellowing of the eyes or skin) low blood pressure (dizziness; feeling faint or lightheaded, falls; unusually weak or tired) low red blood cell counts (trouble breathing; feeling faint; lightheaded, falls; unusually weak or tired) mouth sores redness, blistering, peeling, or loosening of the skin, including inside the mouth stomach pain unusual bruising or bleeding wheezing (trouble breathing with loud or whistling sounds) vomiting Side effects that usually do not require medical attention (report to yourhealth care provider if they continue or are bothersome): headache joint pain muscle cramps, pain nausea This list may not describe all possible side effects. Call your doctor for medical advice about side effects. You may report side effects to FDA at1-800-FDA-1088. Where should I keep my medication? This medicine is given in a hospital or clinic. It will not be stored at home. NOTE: This sheet is a summary. It may not cover all possible information. If you have questions about this medicine, talk to your doctor, pharmacist,  orhealth care provider.  2022 [employer]/Gold Standard (2020-08-23 15:47:26)

## 2021-05-09 NOTE — Progress Notes (Signed)
[place] [phone]   [phone]  PROGRESS NOTE  Patient Care Team: Lindell Spar, MD as PCP - General (Internal Medicine) Danie Binder, MD (Inactive) as Consulting Physician (Gastroenterology) Minus Breeding, MD as Consulting Physician (Cardiology) Annitta Needs, NP (Gastroenterology) Carlis Stable, NP as Nurse Practitioner (Gastroenterology) Montez Morita, Quillian Quince, MD as Consulting Physician (Gastroenterology) Eloise Harman, DO as Consulting Physician (Internal Medicine) Cameron Sprang, MD as Consulting Physician (Neurology)  Hematological/Oncological History  # Low Grade Marginal Zone Lymphoma, Stage III 1) 06/06/2019: patient underwent a diagnostic mammogram of the left breast. Calcifications noted in the left breast, recommended 6 month f/u imaging. 2) 02/02/2020: bilateral diagnostic mammogram showed abnormal enlarged lymph nodes with cortical thickening. The largest of these measures 2.2 x 1.7 x 2.0 cm.  3) 02/02/2020: US guided biopsy of left axillary lymph nodes performed. 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 The overall features are atypical and highly suspicious for non-Hodgkin B-cell lymphoma, particularly marginal zone lymphoma. 4) 02/16/2020: establish care with Dr. Lorenso Courier  5) 02/29/2020: PET CT scan performed, showed hypermetabolic lymph nodes identified in the posterior left neck, both axillary/subpectoral regions, mediastinum, hila, right external iliac chain, and right groin. Massive splenomegaly of 17.8 cm noted as well.  6) 03/20/2020: excisional biopsy of left posterior cervical lymph node. Biopsy confirmed most likely low grade marginal zone lymphoma.   7) 7/14-7/15/2021: Cycle 1 Day 1 of Rituximab/Bendamycin 8) 03/30/2020-04/01/2020: admitted inpatient for hematemesis. Noted to have marked esophagitis on EGD.  9) 04/25/2020: IV feraheme '510mg'$   administered 10) 04/26/2020: Cycle 2 Day 1 of Rituximab/Bendamycin 11) 05/10/2020: HOLD R-Benda in setting of colitis, GI bleed, and intolerance to therapy. Plan to start monotherapy Rituximab once patient has rebounded.  12) 07/06/2020: Cycle 1 Week 1 of monotherapy rituximab 13) 07/30/2020: Cycle 1 Week 3 of monotherapy rituximab 14) 08/06/2020: Completed Cycle 1 of monotherapy rituximab 15)  09/12/2020: PET CT scan shows completed resolution of tumor and FDG avid lesions. Deauville Score 1. Complete response.  16) 11/16/2020: Cycle 1 Day 1 of Maintenance Rituximab  17) 01/10/2021:  Cycle 2 Day 1 of Maintenance Rituximab  18) 03/07/2021: Cycle 3 Day 1 of Maintenance Rituximab  19) 05/09/2021: Cycle 4 Day 1 of Maintenance Rituximab   Interval History:  Elizabeth Mcintyre 80 y.o. female with medical history significant for low grade marginal zone lymphoma stage IIIA who presents for a follow up visit prior Cycle 4 Day 1 of maintenance Rituximab.   On exam today, Elizabeth Mcintyre reports she does feel chilly today.  She is wearing a blanket but notes that she has not had any major focal symptoms.  She does have occasional episodes of her stomach acting up where she has loose stools.  She notes that her appetite has been good and that food does not affect her.  Her energy has been so-so".  She reports that she is moving between 2 houses depleting a lot of her energy..  She denies any fevers, chills, night sweats, shortness of breath, chest pain, cough, neuropathy or skin changes. The rest of the 10 point ROS is negative.  At this time she is willing and able to proceed with therapy.  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   Lower GI bleeding 04/30/2020   Lymphadenopathy 01/23/2020   Osteopenia 2017   Last  bone density 05/04/2017: -2.4   PONV (postoperative nausea and vomiting)    Schatzki's ring    Stress incontinence    Stroke ([address])    mini stroke - found on a CT scan    SURGICAL HISTORY: Past Surgical History:  Procedure Laterality Date   ABDOMINAL HYSTERECTOMY     BALLOON DILATION N/A 08/21/2020   Procedure: BALLOON DILATION;  Surgeon: Eloise Harman, DO;  [facility];  Service: Endoscopy;  Laterality: N/A;   BIOPSY  03/30/2020   Procedure: BIOPSY;  Surgeon: Ronald Lobo, MD;  Location: [facility];  Service: Endoscopy;;   BIOPSY  05/02/2020   Procedure: BIOPSY;  Surgeon: Montez Morita, Quillian Quince, MD;  Location: [facility];  Service: Gastroenterology;;   CARDIAC CATHETERIZATION     X 2, last one in [place] 04/13/2017   Procedure: LAPAROSCOPIC CHOLECYSTECTOMY;  Surgeon: Aviva Signs, MD;  Location: [facility];  Service: General;  Laterality: N/A;   COLONOSCOPY     COLONOSCOPY  May 2012   Dr. Olevia Perches: mild diverticulosis, otherwise normal.    COLONOSCOPY WITH PROPOFOL N/A 05/02/2020   Dr. Jenetta Downer: 8 mm polyp removed from the ascending colon, 2 mm polyp removed from the ascending colon.  Tubular adenomas.  Diverticulosis.  Mucosal ulceration in the sigmoid colon noted, pathology consistent with ischemic colitis.   ESOPHAGOGASTRODUODENOSCOPY N/A 01/28/2015   Dr. Gala Romney: reflux esophagitis, Schatzki's ring not manipulated due to recent bleeding    ESOPHAGOGASTRODUODENOSCOPY N/A 03/30/2015   Dr. Gala Romney: Schatzki's ring s/p Venia Minks dilation, previously noted esophageal ulcer completely healed   ESOPHAGOGASTRODUODENOSCOPY N/A 03/30/2020   Buccini: Moderately severe erosive, circumferential, confluent esophagitis with no bleeding found 25 to 40 cm from incisors.  Nonobstructing and mild Schatzki ring, there were also multiple distal esophageal rings noted, minimal hiatal hernia.   ESOPHAGOGASTRODUODENOSCOPY (EGD) WITH PROPOFOL N/A 08/21/2020   Procedure: ESOPHAGOGASTRODUODENOSCOPY (EGD) WITH PROPOFOL;  Surgeon: Eloise Harman, DO;  [facility];  Service: Endoscopy;  Laterality: N/A;  2:15pm   IR IMAGING GUIDED PORT INSERTION  03/13/2020   LYMPH NODE BIOPSY Left 03/20/2020   Procedure: LEFT POSTERIOR CERVICAL LYMPH NODE BIOPSY;  Surgeon: Georganna Skeans, MD;  Location: [facility];  Service: General;  Laterality: Left;   MALONEY DILATION N/A 03/30/2015   Procedure: Keturah Shavers;  Surgeon: Daneil Dolin, MD;  [facility];  Service: Endoscopy;  Laterality: N/A;   POLYPECTOMY  05/02/2020   Procedure: POLYPECTOMY;  Surgeon: Harvel Quale, MD;  [facility];  Service: Gastroenterology;;    SOCIAL HISTORY: Social History   Socioeconomic History   Marital status: Widowed    Spouse name: Not on file   Number of children: 2   Years of  education: Not on file   Highest education level: Not on file  Occupational History   Occupation: retired  Tobacco Use   Smoking status: Never   Smokeless tobacco: Never  Vaping Use   Vaping Use: Never used  Substance and Sexual Activity   Alcohol use: No   Drug use: No   Sexual activity: Never  Other Topics Concern   Not on file  Social History Narrative   Ms. Fincke is widowed. Her young grandson lives with her, for whom she shares custody with her daughter, the son's aunt.     Right handed    Social Determinants of Health   Financial Resource Strain: Not on  file  Food Insecurity: Not on file  Transportation Needs: Not on file  Physical Activity: Not on file  Stress: Not on file  Social Connections: Not on file  Intimate Partner Violence: Not on file    FAMILY HISTORY: Family History  Problem Relation Age of Onset   Heart disease Mother    Osteoarthritis Mother    Sudden death Father    Single kidney Father    Other Father        h/o severe MVA injuries   Hyperlipidemia Sister    Other Daughter        Myalgias   Fibromyalgia Daughter    Allergies Daughter    Heart disease Maternal Grandfather    Sudden death Paternal Grandmother    Diabetes Paternal Grandfather    Heart disease Daughter    Other Daughter        palpitations   Pulmonary fibrosis Maternal Aunt    Cancer Paternal Uncle    Pulmonary fibrosis Maternal Aunt    Colon cancer Neg Hx     ALLERGIES:  is allergic to codeine phosphate.  MEDICATIONS:  Current Outpatient Medications  Medication Sig Dispense Refill   acetaminophen (TYLENOL) 325 MG tablet Take 325 mg by mouth every 6 (six) hours as needed for moderate pain.      albuterol (VENTOLIN HFA) 108 (90 Base) MCG/ACT inhaler Inhale 2 puffs into the lungs every 6 (six) hours as needed for wheezing. 8.5 g 2   amLODipine (NORVASC) 10 MG tablet Take 1 tablet (10 mg total) by mouth daily. 90 tablet 1   Ascorbic Acid (VITAMIN C) 100 MG tablet Take 100 mg by mouth daily.     cetirizine (ZYRTEC) 10 MG tablet Take 10 mg by mouth at bedtime. As needed     Cholecalciferol (VITAMIN D3) 25 MCG (1000 UT) CAPS Take 1,000 Units by mouth daily.      donepezil (ARICEPT) 10 MG tablet Take 1 tablet daily 30 tablet 11   levothyroxine (SYNTHROID) 75 MCG tablet Take 1 tablet (75 mcg total) by mouth daily. 30 tablet 3   lidocaine-prilocaine (EMLA) cream Apply 1 application topically as needed. (Patient taking differently: Apply 1 application topically as needed (port access).) 30 g 1   pantoprazole (PROTONIX) 40 MG tablet Take 1 tablet  (40 mg total) by mouth daily. 90 tablet 3   potassium chloride SA (KLOR-CON) 20 MEQ tablet Take 1 tablet (20 mEq total) by mouth daily. 21 tablet 0   rOPINIRole (REQUIP) 0.25 MG tablet Take 1 tablet (0.25 mg total) by mouth at bedtime. 90 tablet 1   rosuvastatin (CRESTOR) 20 MG tablet Take 1 tablet (20 mg total) by mouth daily. 90 tablet 3   sucralfate (CARAFATE) 1 g tablet Take 1 g by mouth 4 (four) times daily -  with  meals and at bedtime. As needed     vitamin B-12 (CYANOCOBALAMIN) 1000 MCG tablet Take 1,000 mcg by mouth daily.      No current facility-administered medications for this visit.    REVIEW OF SYSTEMS:   Constitutional: ( - ) fevers, ( - )  chills , ( - ) night sweats Eyes: ( - ) blurriness of vision, ( - ) double vision, ( - ) watery eyes Ears, nose, mouth, throat, and face: ( - ) mucositis, ( - ) sore throat Respiratory: ( - ) cough, ( - ) dyspnea, ( - ) wheezes Cardiovascular: ( - ) palpitation, ( - ) chest discomfort, ( - ) lower extremity swelling Gastrointestinal:  ( - ) nausea, ( - ) heartburn, ( - ) change in bowel habits Skin: ( - ) abnormal skin rashes Lymphatics: ( - ) new lymphadenopathy, ( - ) easy bruising Neurological: ( - ) numbness, ( - ) tingling, ( - ) new weaknesses Behavioral/Psych: ( - ) mood change, ( - ) new changes  All other systems were reviewed with the patient and are negative.  PHYSICAL EXAMINATION: ECOG PERFORMANCE STATUS: 1 - Symptomatic but completely ambulatory  There were no vitals filed for this visit.  There were no vitals filed for this visit.   GENERAL: well appearing elderly Caucasian female in NAD  SKIN: skin color, texture, turgor are normal. EYES: conjunctiva are pink and non-injected, sclera clear NECK: supple, non-tender.  LUNGS: clear to auscultation and percussion with normal breathing effort HEART: regular rate & rhythm and no murmurs and no lower extremity edema Musculoskeletal: no cyanosis of digits and no clubbing   PSYCH: alert & oriented x 3, fluent speech NEURO: no focal motor/sensory deficits  LABORATORY DATA:  I have reviewed the data as listed CBC Latest Ref Rng & Units 03/07/2021 01/30/2021 01/10/2021  WBC 4.0 - 10.5 K/uL 6.4 6.9 5.6  Hemoglobin 12.0 - 15.0 g/dL 11.5(L) 12.8 14.0  Hematocrit 36.0 - 46.0 % 32.1(L) 37.5 40.4  Platelets 150 - 400 K/uL 152 186 171    CMP Latest Ref Rng & Units 03/07/2021 02/12/2021 01/30/2021  Glucose 70 - 99 mg/dL 117(H) 137(H) 106(H)  BUN 8 - 23 mg/dL 17 15 24(H)  Creatinine 0.44 - 1.00 mg/dL 1.28(H) 1.34(H) 1.36(H)  Sodium 135 - 145 mmol/L 140 140 137  Potassium 3.5 - 5.1 mmol/L 3.8 4.4 3.8  Chloride 98 - 111 mmol/L 107 99 100  CO2 22 - 32 mmol/L '24 22 26  '$ Calcium 8.9 - 10.3 mg/dL 9.1 10.0 9.7  Total Protein 6.5 - 8.1 g/dL 6.3(L) - 7.3  Total Bilirubin 0.3 - 1.2 mg/dL 0.9 - 0.6  Alkaline Phos 38 - 126 U/L 81 - 76  AST 15 - 41 U/L 121(H) - 19  ALT 0 - 44 U/L 58(H) - 16    RADIOGRAPHIC STUDIES: MM 3D SCREEN BREAST BILATERAL  Result Date: 04/10/2021 CLINICAL DATA:  Screening. EXAM: DIGITAL SCREENING BILATERAL MAMMOGRAM WITH TOMOSYNTHESIS AND CAD TECHNIQUE: Bilateral screening digital craniocaudal and mediolateral oblique mammograms were obtained. Bilateral screening digital breast tomosynthesis was performed. The images were evaluated with computer-aided detection. COMPARISON:  Previous exam(s). ACR Breast Density Category b: There are scattered areas of fibroglandular density. FINDINGS: There are no findings suspicious for malignancy. IMPRESSION: No mammographic evidence of malignancy. A result letter of this screening mammogram will be mailed directly to the patient. RECOMMENDATION: Screening mammogram in one year. (Code:SM-B-01Y) BI-RADS CATEGORY  1: Negative. Electronically Signed   By: Shanon Brow  Mee Hives M.D   On: 04/10/2021 16:30    ASSESSMENT & PLAN Sharlena CHERELL MARKE 80 y.o. female with medical history significant for low grade marginal zone lymphoma stage  IIIA who presents for a follow up visit.  Previously I recommend a rituximab monotherapy given her intolerance of BR x 2 cycles. This regimen consisted of rituximab 375 mg/m2 IV q weekly x 4 weeks. During her scans on 8/16 - 05/03/2020 she was shown to have marked response to therapy with normalization of spleen size and resolution of lymphadenopathy. Repeat PET CT scan on 09/12/20 after completed of rituximab monotherapy showed a completed response. We will proceed with q 2 months maintenance/consolidation rituximab x 4 cycles.   GELF Criteria: 1 (splenomegaly). Indication for treatment  # Low Grade Marginal Zone Lymphoma, Stage III -- discontinued bendamustine + rituximab as patient is unable to tolerate this treatment. Will pursue rituximab monotherapy instead. Started therapy with Cycle 1 Day 1 on 03/28/2020 BR, decreased to Ritux alone q weekly x 4 starting 07/06/2020. --patient completed excisional lymph node biopsy to confirm the diagnosis. Pathological exam of the lymph node confirms marginal zone lymphoma.  --PET CT scan confirms stage III disease. Patient meets GELF criteria for treatment (splenomegaly). She did not have any B symptoms and her counts were stable at diagnosis, with some mild thrombocytopenia.  --Patient completed monotherapy rituximab x 4 weeks on 08/06/2020.  --post treatment PET CT scan from 09/12/20 showed Deauville Score 1, complete response to therapy. Recommend to proceed with q 8 week rituximab '375mg'$ /m2 maintenance therapy. This can be continued up to 4 doses --Patient started C1D1 of maintenance Rituximab on 11/16/2020.  --Patient returns today for C4D1 of maintenance Rituximab. Labs from today were reviewed without any intervention needed. Patient will proceed with treatment as planned and return to the clinic in 12 weeks for continued monitoring.  #Leukocytosis, acute --unclear etiology, patient has no focal infectious symptoms --OK to proceed with  treatment --continue to monitor  #Concern for GI Bleed, resolved #Hematemesis, resolved #Colitis, resolved --patient admitted on 03/30/2020 (day after Cycle 1 chemotherapy) with hematemesis --no overt GI bleeding noted on Upper GI evaluation -- no signs of bleeding since last visit.  --iron levels appeared low previously and patient was having symptoms of iron deficiency anemia including fatigue, restless leg, and dyspnea on exertion. -- Repeat labs today, Hgb 12.8 --continue to monitor.   #Symptom Management -- zofran '8mg'$  PO q8H PRN and compazine '10mg'$  PO q6H PRN --  EMLA cream for patient's port site --d/c allopurinol --for abdominal pain/headaches, can take tylenol '650mg'$ -'1000mg'$  PO q8H PRN.   No orders of the defined types were placed in this encounter.   All questions were answered. The patient knows to call the clinic with any problems, questions or concerns.  A total of more than 30 minutes were spent on this encounter and over half of that time was spent on counseling and coordination of care as outlined above.   Ledell Peoples, MD Department of Hematology/Oncology [place] at [facility] Phone: [phone] Pager: [phone] Email: [email or web address]  05/09/2021 7:34 AM   Literature Support:  NCCN Guidelines: Consolidation with rituximab should be offered if initially treated with single agent rituximab. (MS-63)  UptoDate: Different schedules have been used. The following administration schedules were used in the randomized trials and are equally acceptable approaches:  ?Rituximab 375 mg/m2 intravenously per week for a total of four doses  ?Rituximab 375 mg/m2 intravenously per week for four weeks, followed  by four additional doses administered every two months

## 2021-05-14 ENCOUNTER — Telehealth: Payer: Self-pay | Admitting: Hematology and Oncology

## 2021-05-14 NOTE — Telephone Encounter (Signed)
Scheduled per los. Called and left msg. Mailed printout  °

## 2021-06-04 ENCOUNTER — Ambulatory Visit (INDEPENDENT_AMBULATORY_CARE_PROVIDER_SITE_OTHER): Payer: Medicare Other

## 2021-06-04 ENCOUNTER — Other Ambulatory Visit: Payer: Self-pay

## 2021-06-04 DIAGNOSIS — Z0000 Encounter for general adult medical examination without abnormal findings: Secondary | ICD-10-CM | POA: Diagnosis not present

## 2021-06-04 NOTE — Progress Notes (Signed)
Subjective:   Elizabeth Mcintyre is a 80 y.o. female who presents for Medicare Annual (Subsequent) preventive examination.I connected with  Elizabeth Mcintyre on 06/04/21 by a audio enabled telemedicine application and verified that I am speaking with the correct person using two identifiers.   I discussed the limitations of evaluation and management by telemedicine. The patient expressed understanding and agreed to proceed.   Location of patient:Home  Location of Provider:Office  Persons participating in virtual visit:Elizabeth Mcintyre (patient) and Elizabeth Mcintyre  Review of Systems    Defer to PCP       Objective:    There were no vitals filed for this visit. There is no height or weight on file to calculate BMI.  Advanced Directives 06/04/2021 03/07/2021 01/30/2021 01/17/2021 01/10/2021 08/17/2020 07/06/2020  Does Patient Have a Medical Advance Directive? Yes Yes Yes Yes Yes Yes No  Type of Paramedic of [place];Living will [place];Living will [place];Living will Living will;Healthcare Power of [place];Living will Living will -  Does patient want to make changes to medical advance directive? - No - Patient declined - - - No - Patient declined -  Copy of [place] in Chart? - No - copy requested - - No - copy requested - -  Would patient like information on creating a medical advance directive? - No - Patient declined - - - No - Patient declined No - Patient declined    Current Medications (verified) Outpatient Encounter Medications as of 06/04/2021  Medication Sig   acetaminophen (TYLENOL) 325 MG tablet Take 325 mg by mouth every 6 (six) hours as needed for moderate pain.    albuterol (VENTOLIN HFA) 108 (90 Base) MCG/ACT inhaler Inhale 2 puffs into the lungs every 6 (six) hours as needed for wheezing.   amLODipine (NORVASC) 10 MG tablet Take 1 tablet (10 mg total) by mouth daily.    Ascorbic Acid (VITAMIN C) 100 MG tablet Take 100 mg by mouth daily.   cetirizine (ZYRTEC) 10 MG tablet Take 10 mg by mouth at bedtime. As needed   Cholecalciferol (VITAMIN D3) 25 MCG (1000 UT) CAPS Take 1,000 Units by mouth daily.    donepezil (ARICEPT) 10 MG tablet Take 1 tablet daily   levothyroxine (SYNTHROID) 75 MCG tablet Take 1 tablet (75 mcg total) by mouth daily.   lidocaine-prilocaine (EMLA) cream Apply 1 application topically as needed. (Patient taking differently: Apply 1 application topically as needed (port access).)   pantoprazole (PROTONIX) 40 MG tablet Take 1 tablet (40 mg total) by mouth daily.   potassium chloride SA (KLOR-CON) 20 MEQ tablet Take 1 tablet (20 mEq total) by mouth daily.   rOPINIRole (REQUIP) 0.25 MG tablet Take 1 tablet (0.25 mg total) by mouth at bedtime.   rosuvastatin (CRESTOR) 20 MG tablet Take 1 tablet (20 mg total) by mouth daily.   sucralfate (CARAFATE) 1 g tablet Take 1 g by mouth 4 (four) times daily -  with meals and at bedtime. As needed   vitamin B-12 (CYANOCOBALAMIN) 1000 MCG tablet Take 1,000 mcg by mouth daily.    No facility-administered encounter medications on file as of 06/04/2021.    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   Lower  GI bleeding 04/30/2020   Lymphadenopathy 01/23/2020   Osteopenia 2017   Last  bone density 05/04/2017: -2.4   PONV (postoperative nausea and vomiting)    Schatzki's ring    Stress incontinence    Stroke [facility])    mini stroke - found on a CT scan   Past Surgical History:  Procedure Laterality Date   ABDOMINAL HYSTERECTOMY     BALLOON DILATION N/A 08/21/2020   Procedure: BALLOON DILATION;  Surgeon: Eloise Harman, DO;  [facility];  Service: Endoscopy;  Laterality: N/A;   BIOPSY  03/30/2020   Procedure: BIOPSY;  Surgeon: Ronald Lobo, MD;  Location: [facility];  Service: Endoscopy;;   BIOPSY  05/02/2020   Procedure: BIOPSY;  Surgeon: Montez Morita, Quillian Quince, MD;  Location[facility];  Service: Gastroenterology;;   CARDIAC CATHETERIZATION     X 2, last one in [place] 04/13/2017   Procedure: LAPAROSCOPIC CHOLECYSTECTOMY;  Surgeon: Aviva Signs, MD;  Location: [facility];  Service: General;  Laterality: N/A;   COLONOSCOPY     COLONOSCOPY  May 2012   Dr. Olevia Perches: mild diverticulosis, otherwise normal.    COLONOSCOPY WITH PROPOFOL N/A 05/02/2020   Dr. Jenetta Downer: 8 mm polyp removed from the ascending colon, 2 mm polyp removed from the ascending colon.  Tubular adenomas.  Diverticulosis.  Mucosal ulceration in the sigmoid colon noted, pathology consistent with ischemic colitis.   ESOPHAGOGASTRODUODENOSCOPY N/A 01/28/2015   Dr. Gala Romney: reflux esophagitis, Schatzki's ring not manipulated due to recent bleeding   ESOPHAGOGASTRODUODENOSCOPY N/A 03/30/2015   Dr. Gala Romney: Schatzki's ring s/p Venia Minks dilation, previously noted esophageal ulcer completely healed   ESOPHAGOGASTRODUODENOSCOPY N/A 03/30/2020   Buccini: Moderately severe erosive, circumferential, confluent esophagitis with no bleeding found 25 to 40 cm from incisors.  Nonobstructing and mild Schatzki ring, there were also multiple distal esophageal rings noted, minimal hiatal hernia.    ESOPHAGOGASTRODUODENOSCOPY (EGD) WITH PROPOFOL N/A 08/21/2020   Procedure: ESOPHAGOGASTRODUODENOSCOPY (EGD) WITH PROPOFOL;  Surgeon: Eloise Harman, DO;  [facility];  Service: Endoscopy;  Laterality: N/A;  2:15pm   IR IMAGING GUIDED PORT INSERTION  03/13/2020   LYMPH NODE BIOPSY Left 03/20/2020   Procedure: LEFT POSTERIOR CERVICAL LYMPH NODE BIOPSY;  Surgeon: Georganna Skeans, MD;  Location: [facility];  Service: General;  Laterality: Left;   MALONEY DILATION N/A 03/30/2015   Procedure: Keturah Shavers;  Surgeon: Daneil Dolin, MD;  [facility];  Service: Endoscopy;  Laterality: N/A;   POLYPECTOMY  05/02/2020   Procedure: POLYPECTOMY;  Surgeon: Harvel Quale, MD;  [facility];  Service: Gastroenterology;;   Family History  Problem Relation Age of Onset   Heart disease Mother    Osteoarthritis Mother    Sudden death Father    Single kidney Father    Other Father  h/o severe MVA injuries   Hyperlipidemia Sister    Other Daughter        Myalgias   Fibromyalgia Daughter    Allergies Daughter    Heart disease Maternal Grandfather    Sudden death Paternal Grandmother    Diabetes Paternal Grandfather    Heart disease Daughter    Other Daughter        palpitations   Pulmonary fibrosis Maternal Aunt    Cancer Paternal Uncle    Pulmonary fibrosis Maternal Aunt    Colon cancer Neg Hx    Social History   Socioeconomic History   Marital status: Widowed    Spouse name: Not on file   Number of children: 2   Years of education: Not on file   Highest education level: Not on file  Occupational History   Occupation: retired  Tobacco Use   Smoking status: Never   Smokeless tobacco: Never  Vaping Use   Vaping Use: Never used  Substance and Sexual Activity   Alcohol use: No   Drug use: No   Sexual activity: Never  Other Topics Concern   Not on file  Social History Narrative   Ms. Mcnay is widowed. Her young grandson lives with her,  for whom she shares custody with her daughter, the son's aunt.     Right handed    Social Determinants of Health   Financial Resource Strain: Low Risk    Difficulty of Paying Living Expenses: Not hard at all  Food Insecurity: No Food Insecurity   Worried About Charity fundraiser in the Last Year: Never true   Ran Out of Food in the Last Year: Never true  Transportation Needs: No Transportation Needs   Lack of Transportation (Medical): No   Lack of Transportation (Non-Medical): No  Physical Activity: Insufficiently Active   Days of Exercise per Week: 5 days   Minutes of Exercise per Session: 10 min  Stress: No Stress Concern Present   Feeling of Stress : Only a little  Social Connections: Socially Isolated   Frequency of Communication with Friends and Family: More than three times a week   Frequency of Social Gatherings with Friends and Family: More than three times a week   Attends Religious Services: Never   Marine scientist or Organizations: No   Attends Archivist Meetings: Never   Marital Status: Widowed    Tobacco Counseling Counseling given: Not Answered   Clinical Intake:  Pre-visit preparation completed: Yes  Pain : No/denies pain     Diabetes: No  How often do you need to have someone help you when you read instructions, pamphlets, or other written materials from your doctor or pharmacy?: 1 - Never What is the last grade level you completed in school?: 12TH  Diabetic?NO  Interpreter Needed?: No  Information entered by :: Loukisha Gunnerson J,CMA   Activities of Daily Living In your present state of health, do you have any difficulty performing the following activities: 06/04/2021 08/17/2020  Hearing? N N  Vision? N N  Difficulty concentrating or making decisions? N N  Walking or climbing stairs? N N  Dressing or bathing? N N  Doing errands, shopping? N N  Preparing Food and eating ? N -  Using the Toilet? N -  In the past six months, have you  accidently leaked urine? N -  Do you have problems with loss of bowel control? N -  Managing your Medications? N -  Managing your Finances?  N -  Housekeeping or managing your Housekeeping? N -  Some recent data might be hidden    Patient Care Team: Lindell Spar, MD as PCP - General (Internal Medicine) Danie Binder, MD (Inactive) as Consulting Physician (Gastroenterology) Minus Breeding, MD as Consulting Physician (Cardiology) Annitta Needs, NP (Gastroenterology) Carlis Stable, NP as Nurse Practitioner (Gastroenterology) Montez Morita, Quillian Quince, MD as Consulting Physician (Gastroenterology) Eloise Harman, DO as Consulting Physician (Internal Medicine) Cameron Sprang, MD as Consulting Physician (Neurology)  Indicate any recent Medical Services you may have received from other than [facility] providers in the past year (date may be approximate).     Assessment:   This is a routine wellness examination for [place].  Hearing/Vision screen No results found.  Dietary issues and exercise activities discussed: Current Exercise Habits: Home exercise routine, Type of exercise: walking, Time (Minutes): 10, Frequency (Times/Week): 5, Weekly Exercise (Minutes/Week): 50, Intensity: Mild   Goals Addressed   None    Depression Screen PHQ 2/9 Scores 06/04/2021 02/12/2021 01/02/2021 08/14/2020 04/06/2020 12/29/2019 09/29/2018  PHQ - 2 Score 1 3 0 5 0 0 2  PHQ- 9 Score - 14 4 14 6 9 13     Fall Risk Fall Risk  06/04/2021 02/12/2021 01/17/2021 01/02/2021 08/14/2020  Falls in the past year? 0 0 0 0 1  Number falls in past yr: 0 0 0 0 0  Injury with Fall? 0 0 0 0 0  Risk for fall due to : No Fall Risks No Fall Risks - No Fall Risks -  Follow up Falls evaluation completed Falls evaluation completed - Falls evaluation completed -    FALL RISK PREVENTION PERTAINING TO THE HOME:  Any stairs in or around the home? Yes  If so, are there any without handrails? No  Home free of loose throw rugs in  walkways, pet beds, electrical cords, etc? Yes  Adequate lighting in your home to reduce risk of falls? Yes   ASSISTIVE DEVICES UTILIZED TO PREVENT FALLS:  Life alert? No  Use of a cane, walker or w/c? No  Grab bars in the bathroom? No  Shower chair or bench in shower? No  Elevated toilet seat or a handicapped toilet? No   TIMED UP AND GO:  Was the test performed? No .  Length of time to ambulate 10 feet: N/A sec.     Cognitive Function: MMSE - Mini Mental State Exam 03/27/2021 09/29/2018 03/13/2017  Orientation to time 5 5 5   Orientation to Place 5 5 5   Registration 3 3 3   Attention/ Calculation 3 3 5   Recall 2 2 3   Language- name 2 objects 2 2 2   Language- repeat 1 1 1   Language- follow 3 step command 1 1 3   Language- read & follow direction 1 1 1   Write a sentence 1 1 1   Copy design 1 1 1   Total score 25 25 30    Montreal Cognitive Assessment  01/17/2021  Visuospatial/ Executive (0/5) 1  Naming (0/3) 0  Attention: Read list of digits (0/2) 2  Attention: Read list of letters (0/1) 0  Attention: Serial 7 subtraction starting at 100 (0/3) 2  Language: Repeat phrase (0/2) 1  Language : Fluency (0/1) 0  Abstraction (0/2) 0  Delayed Recall (0/5) 1  Orientation (0/6) 6  Total 13  Adjusted Score (based on education) 14   6CIT Screen 06/04/2021  What Year? 0 points  What month? 0 points  What time? 0 points  Count  back from 20 0 points  Months in reverse 0 points  Repeat phrase 0 points  Total Score 0    Immunizations Immunization History  Administered Date(s) Administered   Influenza Split 07/16/2011   Influenza, High Dose Seasonal PF 10/15/2017, 09/03/2018   Influenza,inj,Quad PF,6+ Mos 06/09/2014, 10/15/2015   Moderna Sars-Covid-2 Vaccination 11/10/2019, 12/09/2019   PFIZER Comirnaty(Gray Top)Covid-19 Tri-Sucrose Vaccine 01/10/2021   Pneumococcal Conjugate-13 03/13/2017   Pneumococcal Polysaccharide-23 10/27/2014   Tdap 07/16/2011    TDAP status: Up to  date  Flu Vaccine status: Due, Education has been provided regarding the importance of this vaccine. Advised may receive this vaccine at local pharmacy or Health Dept. Aware to provide a copy of the vaccination record if obtained from local pharmacy or Health Dept. Verbalized acceptance and understanding.  Covid-19 vaccine status: Information provided on how to obtain vaccines.   Qualifies for Shingles Vaccine? Yes   Zostavax completed No   Shingrix Completed?: No.    Education has been provided regarding the importance of this vaccine. Patient has been advised to call insurance company to determine out of pocket expense if they have not yet received this vaccine. Advised may also receive vaccine at local pharmacy or Health Dept. Verbalized acceptance and understanding.  Screening Tests Health Maintenance  Topic Date Due   Zoster Vaccines- Shingrix (1 of 2) Never done   COVID-19 Vaccine (4 - Booster) 04/04/2021   INFLUENZA VACCINE  04/15/2021   DEXA SCAN  05/08/2021   HEMOGLOBIN A1C  05/16/2021   TETANUS/TDAP  07/15/2021   LIPID PANEL  11/13/2021   OPHTHALMOLOGY EXAM  02/12/2022   MAMMOGRAM  04/09/2022   Hepatitis C Screening  Completed   HPV VACCINES  Aged Out   FOOT EXAM  Discontinued    Health Maintenance  Health Maintenance Due  Topic Date Due   Zoster Vaccines- Shingrix (1 of 2) Never done   COVID-19 Vaccine (4 - Booster) 04/04/2021   INFLUENZA VACCINE  04/15/2021   DEXA SCAN  05/08/2021   HEMOGLOBIN A1C  05/16/2021    Colorectal cancer screening: No longer required.   Mammogram status: Completed 04/09/2021. Repeat every year  Bone Density status: Completed 04/19/2019. Results reflect: Bone density results: OSTEOPENIA. Repeat every 2 years.  Lung Cancer Screening: (Low Dose CT Chest recommended if Age 58-80 years, 30 pack-year currently smoking OR have quit w/in 15years.) does not qualify.   Lung Cancer Screening Referral: no  Additional Screening:  Hepatitis C  Screening: does qualify; Completed 03/07/2020  Vision Screening: Recommended annual ophthalmology exams for early detection of glaucoma and other disorders of the eye. Is the patient up to date with their annual eye exam?  No  Who is the provider or what is the name of the office in which the patient attends annual eye exams? N/A If pt is not established with a provider, would they like to be referred to a provider to establish care? No .   Dental Screening: Recommended annual dental exams for proper oral hygiene  Community Resource Referral / Chronic Care Management: CRR required this visit?  No   CCM required this visit?  No      Plan:     I have personally reviewed and noted the following in the patient's chart:   Medical and social history Use of alcohol, tobacco or illicit drugs  Current medications and supplements including opioid prescriptions.  Functional ability and status Nutritional status Physical activity Advanced directives List of other physicians Hospitalizations, surgeries, and ER visits  in previous 12 months Vitals Screenings to include cognitive, depression, and falls Referrals and appointments  In addition, I have reviewed and discussed with patient certain preventive protocols, quality metrics, and best practice recommendations. A written personalized care plan for preventive services as well as general preventive health recommendations were provided to patient.     Edgar Frisk, [facility]   06/04/2021   Nurse Notes: Non Face to Face 30 minute visit    Ms. Moshe Cipro , Thank you for taking time to come for your Medicare Wellness Visit. I appreciate your ongoing commitment to your health goals. Please review the following plan we discussed and let me know if I can assist you in the future.   These are the goals we discussed:  Goals      Exercise 150 minutes per week (moderate activity)     Increase activity.      Patient Stated     Volunteer more.          This is a list of the screening recommended for you and due dates:  Health Maintenance  Topic Date Due   Zoster (Shingles) Vaccine (1 of 2) Never done   COVID-19 Vaccine (4 - Booster) 04/04/2021   Flu Shot  04/15/2021   DEXA scan (bone density measurement)  05/08/2021   Hemoglobin A1C  05/16/2021   Tetanus Vaccine  07/15/2021   Lipid (cholesterol) test  11/13/2021   Eye exam for diabetics  02/12/2022   Mammogram  04/09/2022   Hepatitis C Screening: USPSTF Recommendation to screen - Ages 18-79 yo.  Completed   HPV Vaccine  Aged Out   Complete foot exam   Discontinued

## 2021-06-04 NOTE — Patient Instructions (Signed)
Health Maintenance, Female Adopting a healthy lifestyle and getting preventive care are important in promoting health and wellness. Ask your health care provider about: The right schedule for you to have regular tests and exams. Things you can do on your own to prevent diseases and keep yourself healthy. What should I know about diet, weight, and exercise? Eat a healthy diet  Eat a diet that includes plenty of vegetables, fruits, low-fat dairy products, and lean protein. Do not eat a lot of foods that are high in solid fats, added sugars, or sodium. Maintain a healthy weight Body mass index (BMI) is used to identify weight problems. It estimates body fat based on height and weight. Your health care provider can help determine your BMI and help you achieve or maintain a healthy weight. Get regular exercise Get regular exercise. This is one of the most important things you can do for your health. Most adults should: Exercise for at least 150 minutes each week. The exercise should increase your heart rate and make you sweat (moderate-intensity exercise). Do strengthening exercises at least twice a week. This is in addition to the moderate-intensity exercise. Spend less time sitting. Even light physical activity can be beneficial. Watch cholesterol and blood lipids Have your blood tested for lipids and cholesterol at 79 years of age, then have this test every 5 years. Have your cholesterol levels checked more often if: Your lipid or cholesterol levels are high. You are older than 79 years of age. You are at high risk for heart disease. What should I know about cancer screening? Depending on your health history and family history, you may need to have cancer screening at various ages. This may include screening for: Breast cancer. Cervical cancer. Colorectal cancer. Skin cancer. Lung cancer. What should I know about heart disease, diabetes, and high blood pressure? Blood pressure and heart  disease High blood pressure causes heart disease and increases the risk of stroke. This is more likely to develop in people who have high blood pressure readings, are of African descent, or are overweight. Have your blood pressure checked: Every 3-5 years if you are 18-39 years of age. Every year if you are 40 years old or older. Diabetes Have regular diabetes screenings. This checks your fasting blood sugar level. Have the screening done: Once every three years after age 40 if you are at a normal weight and have a low risk for diabetes. More often and at a younger age if you are overweight or have a high risk for diabetes. What should I know about preventing infection? Hepatitis B If you have a higher risk for hepatitis B, you should be screened for this virus. Talk with your health care provider to find out if you are at risk for hepatitis B infection. Hepatitis C Testing is recommended for: Everyone born from 1945 through 1965. Anyone with known risk factors for hepatitis C. Sexually transmitted infections (STIs) Get screened for STIs, including gonorrhea and chlamydia, if: You are sexually active and are younger than 79 years of age. You are older than 79 years of age and your health care provider tells you that you are at risk for this type of infection. Your sexual activity has changed since you were last screened, and you are at increased risk for chlamydia or gonorrhea. Ask your health care provider if you are at risk. Ask your health care provider about whether you are at high risk for HIV. Your health care provider may recommend a prescription medicine   to help prevent HIV infection. If you choose to take medicine to prevent HIV, you should first get tested for HIV. You should then be tested every 3 months for as long as you are taking the medicine. Pregnancy If you are about to stop having your period (premenopausal) and you may become pregnant, seek counseling before you get  pregnant. Take 400 to 800 micrograms (mcg) of folic acid every day if you become pregnant. Ask for birth control (contraception) if you want to prevent pregnancy. Osteoporosis and menopause Osteoporosis is a disease in which the bones lose minerals and strength with aging. This can result in bone fractures. If you are 65 years old or older, or if you are at risk for osteoporosis and fractures, ask your health care provider if you should: Be screened for bone loss. Take a calcium or vitamin D supplement to lower your risk of fractures. Be given hormone replacement therapy (HRT) to treat symptoms of menopause. Follow these instructions at home: Lifestyle Do not use any products that contain nicotine or tobacco, such as cigarettes, e-cigarettes, and chewing tobacco. If you need help quitting, ask your health care provider. Do not use street drugs. Do not share needles. Ask your health care provider for help if you need support or information about quitting drugs. Alcohol use Do not drink alcohol if: Your health care provider tells you not to drink. You are pregnant, may be pregnant, or are planning to become pregnant. If you drink alcohol: Limit how much you use to 0-1 drink a day. Limit intake if you are breastfeeding. Be aware of how much alcohol is in your drink. In the [place] one drink equals one 12 oz bottle of beer (355 mL), one 5 oz glass of wine (148 mL), or one 1 oz glass of hard liquor (44 mL). General instructions Schedule regular health, dental, and eye exams. Stay current with your vaccines. Tell your health care provider if: You often feel depressed. You have ever been abused or do not feel safe at home. Summary Adopting a healthy lifestyle and getting preventive care are important in promoting health and wellness. Follow your health care provider's instructions about healthy diet, exercising, and getting tested or screened for diseases. Follow your health care provider's  instructions on monitoring your cholesterol and blood pressure. This information is not intended to replace advice given to you by your health care provider. Make sure you discuss any questions you have with your health care provider. Document Revised: 11/09/2020 Document Reviewed: 08/25/2018 Elsevier Patient Education  2022 [employer]  

## 2021-06-05 ENCOUNTER — Emergency Department (HOSPITAL_COMMUNITY)
Admission: EM | Admit: 2021-06-05 | Discharge: 2021-06-05 | Disposition: A | Discharge status: Home / Self Care | Payer: Medicare Other | Attending: Student | Admitting: Student

## 2021-06-05 ENCOUNTER — Emergency Department (HOSPITAL_COMMUNITY): Payer: Medicare Other

## 2021-06-05 DIAGNOSIS — R0602 Shortness of breath: Secondary | ICD-10-CM | POA: Diagnosis not present

## 2021-06-05 DIAGNOSIS — R109 Unspecified abdominal pain: Secondary | ICD-10-CM

## 2021-06-05 DIAGNOSIS — R1084 Generalized abdominal pain: Secondary | ICD-10-CM | POA: Diagnosis not present

## 2021-06-05 DIAGNOSIS — I129 Hypertensive chronic kidney disease with stage 1 through stage 4 chronic kidney disease, or unspecified chronic kidney disease: Secondary | ICD-10-CM | POA: Insufficient documentation

## 2021-06-05 DIAGNOSIS — R197 Diarrhea, unspecified: Secondary | ICD-10-CM | POA: Insufficient documentation

## 2021-06-05 DIAGNOSIS — R55 Syncope and collapse: Secondary | ICD-10-CM | POA: Insufficient documentation

## 2021-06-05 DIAGNOSIS — K922 Gastrointestinal hemorrhage, unspecified: Secondary | ICD-10-CM

## 2021-06-05 DIAGNOSIS — E1122 Type 2 diabetes mellitus with diabetic chronic kidney disease: Secondary | ICD-10-CM | POA: Diagnosis not present

## 2021-06-05 DIAGNOSIS — E039 Hypothyroidism, unspecified: Secondary | ICD-10-CM | POA: Insufficient documentation

## 2021-06-05 DIAGNOSIS — N1831 Chronic kidney disease, stage 3a: Secondary | ICD-10-CM | POA: Insufficient documentation

## 2021-06-05 DIAGNOSIS — R0789 Other chest pain: Secondary | ICD-10-CM | POA: Diagnosis not present

## 2021-06-05 DIAGNOSIS — Z79899 Other long term (current) drug therapy: Secondary | ICD-10-CM | POA: Diagnosis not present

## 2021-06-05 DIAGNOSIS — Z743 Need for continuous supervision: Secondary | ICD-10-CM | POA: Diagnosis not present

## 2021-06-05 DIAGNOSIS — Z9049 Acquired absence of other specified parts of digestive tract: Secondary | ICD-10-CM | POA: Diagnosis not present

## 2021-06-05 DIAGNOSIS — R519 Headache, unspecified: Secondary | ICD-10-CM | POA: Insufficient documentation

## 2021-06-05 DIAGNOSIS — J4520 Mild intermittent asthma, uncomplicated: Secondary | ICD-10-CM | POA: Diagnosis not present

## 2021-06-05 DIAGNOSIS — R42 Dizziness and giddiness: Secondary | ICD-10-CM | POA: Diagnosis not present

## 2021-06-05 DIAGNOSIS — R079 Chest pain, unspecified: Secondary | ICD-10-CM | POA: Diagnosis not present

## 2021-06-05 DIAGNOSIS — R6889 Other general symptoms and signs: Secondary | ICD-10-CM | POA: Diagnosis not present

## 2021-06-05 LAB — URINALYSIS, ROUTINE W REFLEX MICROSCOPIC
Bilirubin Urine: NEGATIVE
Glucose, UA: NEGATIVE mg/dL
Hgb urine dipstick: NEGATIVE
Ketones, ur: NEGATIVE mg/dL
Leukocytes,Ua: NEGATIVE
Nitrite: NEGATIVE
Protein, ur: NEGATIVE mg/dL
Specific Gravity, Urine: 1.015 (ref 1.005–1.030)
pH: 7.5 (ref 5.0–8.0)

## 2021-06-05 LAB — COMPREHENSIVE METABOLIC PANEL
ALT: 12 U/L (ref 0–44)
AST: 20 U/L (ref 15–41)
Albumin: 4.5 g/dL (ref 3.5–5.0)
Alkaline Phosphatase: 83 U/L (ref 38–126)
Anion gap: 10 (ref 5–15)
BUN: 11 mg/dL (ref 8–23)
CO2: 24 mmol/L (ref 22–32)
Calcium: 9.3 mg/dL (ref 8.9–10.3)
Chloride: 104 mmol/L (ref 98–111)
Creatinine, Ser: 0.98 mg/dL (ref 0.44–1.00)
GFR, Estimated: 59 mL/min — ABNORMAL LOW (ref >60)
Glucose, Bld: 148 mg/dL — ABNORMAL HIGH (ref 70–99)
Potassium: 2.9 mmol/L — ABNORMAL LOW (ref 3.5–5.1)
Sodium: 138 mmol/L (ref 135–145)
Total Bilirubin: 0.8 mg/dL (ref 0.3–1.2)
Total Protein: 7.3 g/dL (ref 6.5–8.1)

## 2021-06-05 LAB — CBC WITH DIFFERENTIAL/PLATELET
Abs Immature Granulocytes: 0.09 10*3/uL — ABNORMAL HIGH (ref 0.00–0.07)
Basophils Absolute: 0.1 10*3/uL (ref 0.0–0.1)
Basophils Relative: 1 %
Eosinophils Absolute: 0.2 10*3/uL (ref 0.0–0.5)
Eosinophils Relative: 2 %
HCT: 40 % (ref 36.0–46.0)
Hemoglobin: 13.6 g/dL (ref 12.0–15.0)
Immature Granulocytes: 1 %
Lymphocytes Relative: 17 %
Lymphs Abs: 1.6 10*3/uL (ref 0.7–4.0)
MCH: 29.6 pg (ref 26.0–34.0)
MCHC: 34 g/dL (ref 30.0–36.0)
MCV: 87.1 fL (ref 80.0–100.0)
Monocytes Absolute: 0.5 10*3/uL (ref 0.1–1.0)
Monocytes Relative: 6 %
Neutro Abs: 7 10*3/uL (ref 1.7–7.7)
Neutrophils Relative %: 73 %
Platelets: 179 10*3/uL (ref 150–400)
RBC: 4.59 MIL/uL (ref 3.87–5.11)
RDW: 13.5 % (ref 11.5–15.5)
WBC: 9.4 10*3/uL (ref 4.0–10.5)
nRBC: 0 % (ref 0.0–0.2)

## 2021-06-05 LAB — LIPASE, BLOOD: Lipase: 30 U/L (ref 11–51)

## 2021-06-05 LAB — TROPONIN I (HIGH SENSITIVITY)
Troponin I (High Sensitivity): 3 ng/L (ref <18)
Troponin I (High Sensitivity): 3 ng/L (ref <18)

## 2021-06-05 LAB — BRAIN NATRIURETIC PEPTIDE: B Natriuretic Peptide: 73.2 pg/mL (ref 0.0–100.0)

## 2021-06-05 IMAGING — CT CT CTA ABD/PEL W/CM AND/OR W/O CM
3 of 12 series · 12 of 46 positions shown, 17 images · IV contrast (APPLIED)
Comparison: CT abdomen and pelvis dated [DATE]

CLINICAL DATA: Concern for GI bleed or typhlitis

EXAM:
CTA ABDOMEN AND PELVIS WITHOUT AND WITH CONTRAST
TECHNIQUE: Multidetector CT imaging of the abdomen and pelvis was performed
using the standard protocol during bolus administration of
intravenous contrast. Multiplanar reconstructed images and MIPs were
obtained and reviewed to evaluate the vascular anatomy.
CONTRAST:  100mL OMNIPAQUE IOHEXOL 350 MG/ML SOLN

[Series 7: arterial · axial · arterial · 0.69mm/px · z∈[+954,+1284]mm · 7 of 248 slices shown]
[im 21/248  soft-tissue]
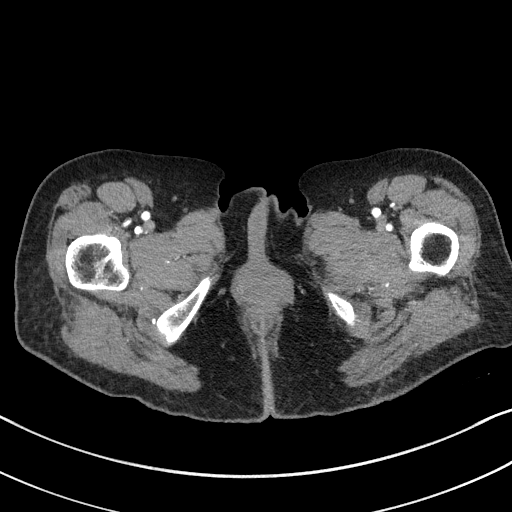
[im 62/248  soft-tissue]
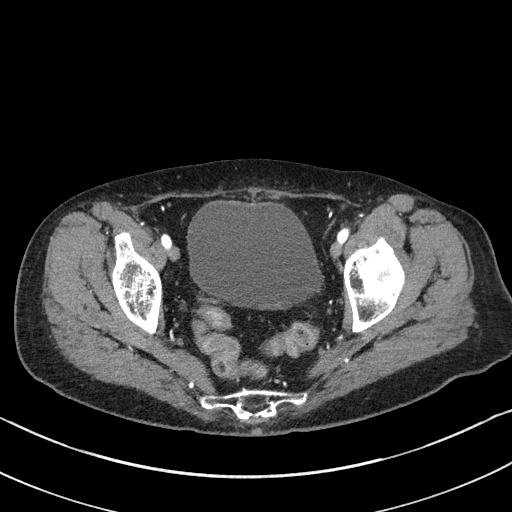
[im 83/248  soft-tissue]
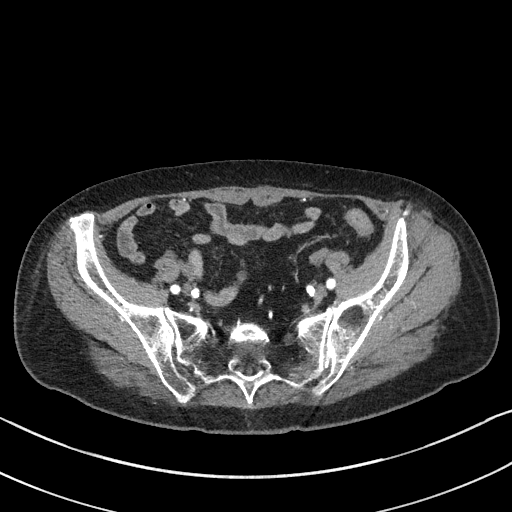
[im 103/248  soft-tissue]
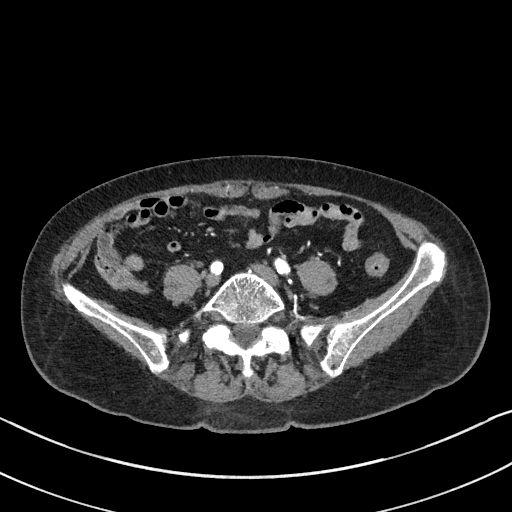
[im 145/248  soft-tissue]
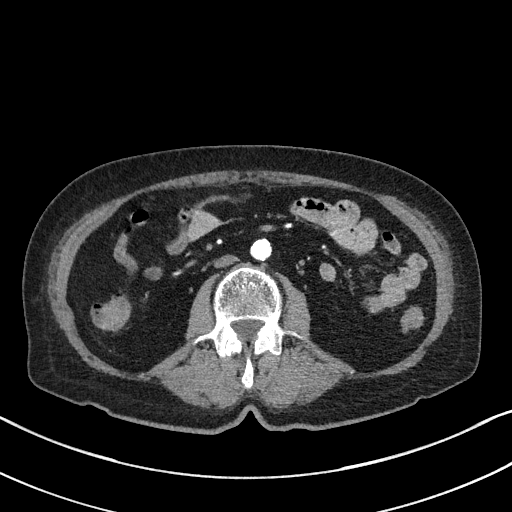
[im 165/248  soft-tissue]
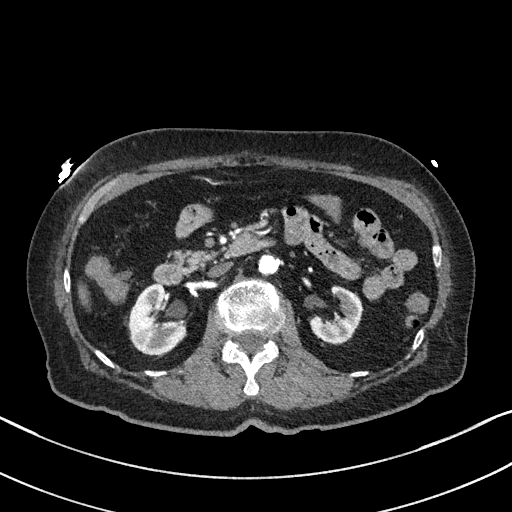
[im 186/248  soft-tissue]
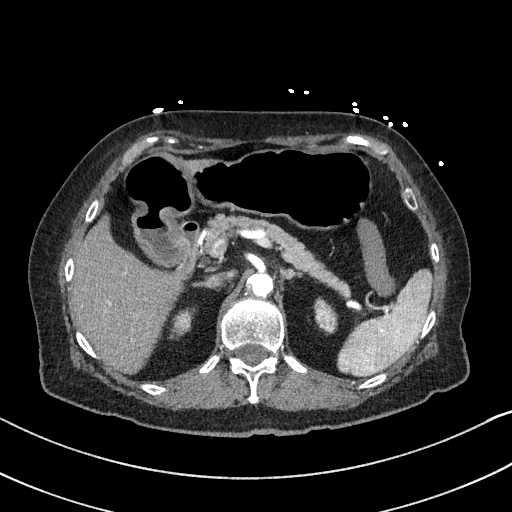

[Series 9: portal venous · axial · portal-venous · 0.69mm/px · z∈[+1042,+1282]mm · 3 of 98 slices shown, 7 images]
[im 25/98  soft-tissue]
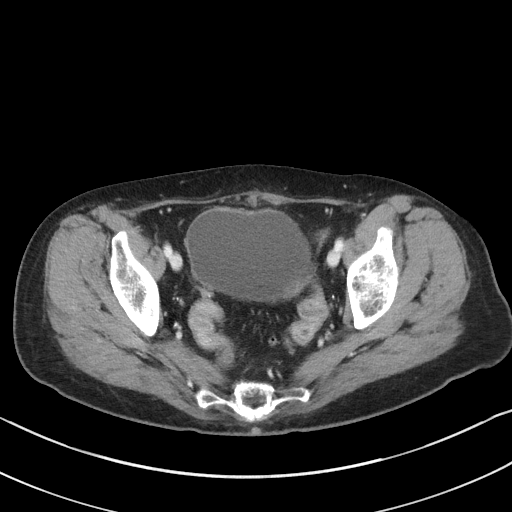
[im 25/98  lung]
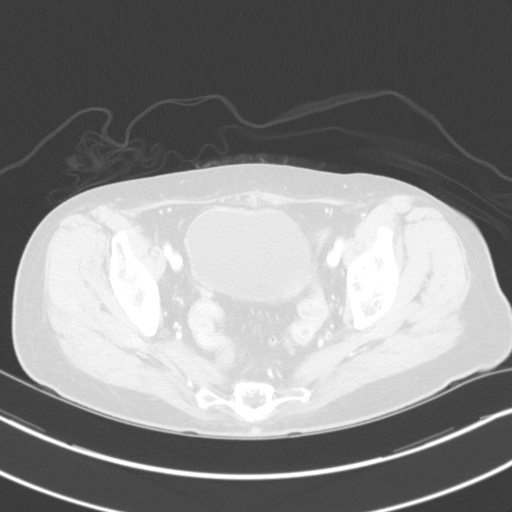
[im 25/98  bone]
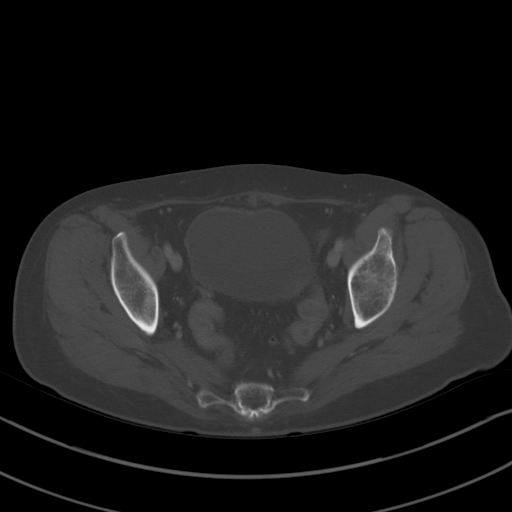
[im 49/98  soft-tissue]
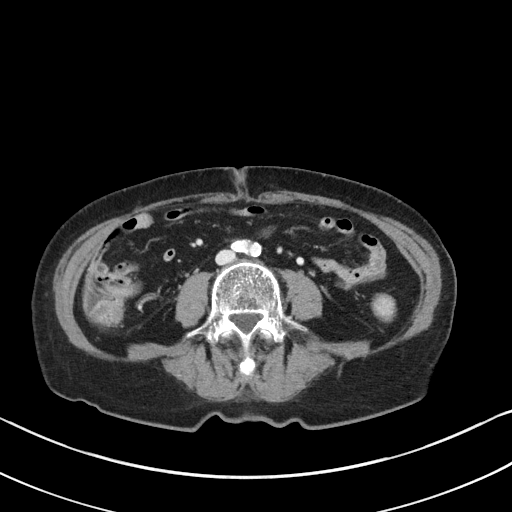
[im 49/98  lung]
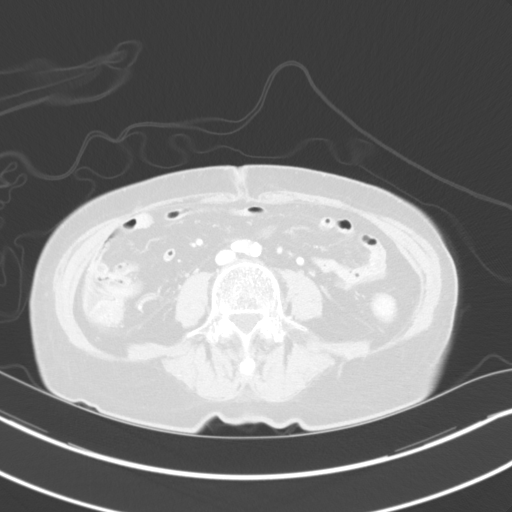
[im 73/98  soft-tissue]
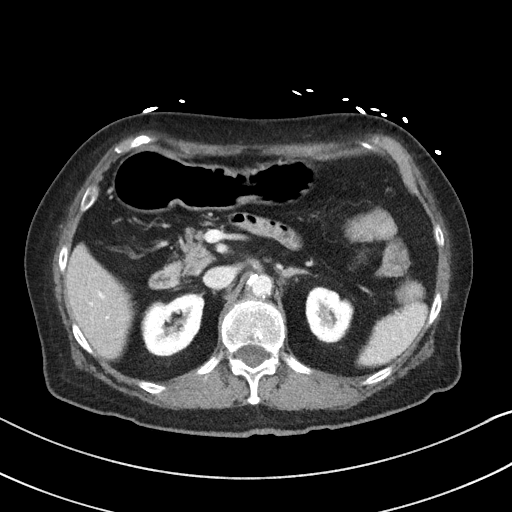
[im 73/98  lung]
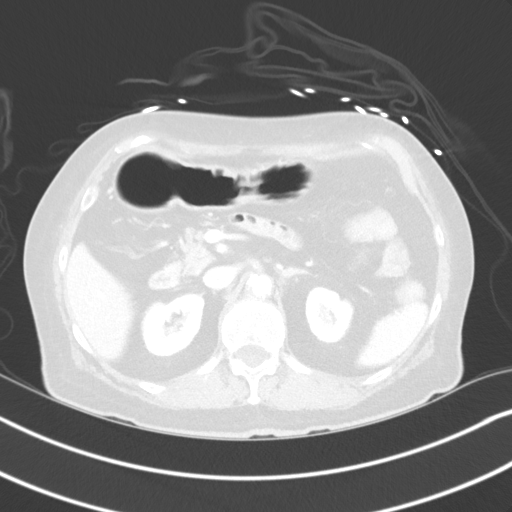

[Series 13: coronal art · coronal · 0.70mm/px · 2 of 120 slices shown, 3 images]
[im 40/120  soft-tissue]
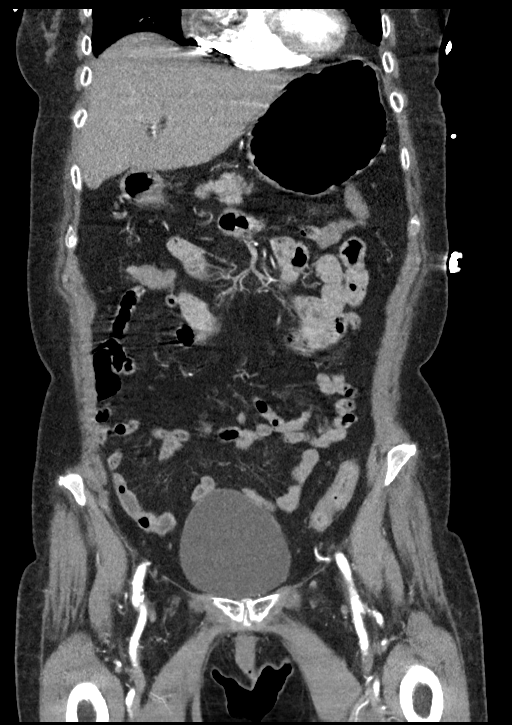
[im 40/120  bone]
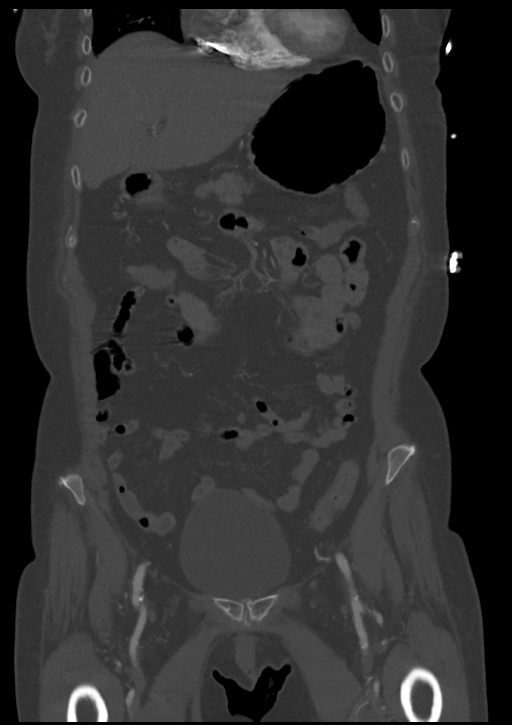
[im 80/120  soft-tissue]
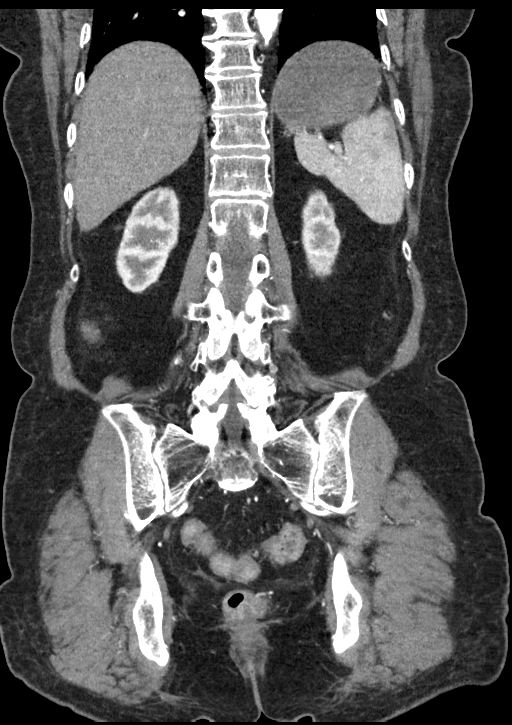

[12 of 46 positions shown; findings below may reference images not displayed]

FINDINGS: VASCULAR

Aorta: Normal caliber with severe calcified and noncalcified plaque.

Celiac: Patent with mild narrowing at the origin due to calcified
plaque.

SMA: Patent with mild narrowing at the origin due to calcified
plaque.

Renals: Patent. With severe stenosis of the proximal bilateral renal
arteries due to calcified plaque.

IMA: Patent with narrowing at the origin due to calcified plaque.

Iliofemoral: Iliofemoral vessels are patent with multifocal mild
narrowing due to calcified and noncalcified plaque. More severe
multifocal narrowing of the bilateral internal iliac arteries.

Veins: No venous abnormality.

Review of the MIP images confirms the above findings.

NON-VASCULAR

Lower chest: No acute abnormality.

Hepatobiliary: No focal liver abnormality is seen. Status post
cholecystectomy. No biliary dilatation.

Pancreas: Atrophic. No pancreatic ductal dilatation or surrounding
inflammatory changes.

Spleen: Normal in size without focal abnormality.

Adrenals/Urinary Tract: Bilateral adrenal glands are unremarkable.
Kidneys enhance symmetrically with no evidence of hydronephrosis.
Simple cyst of the right kidney. Bladder is unremarkable.

Stomach/Bowel: Stomach is within normal limits. Appendix appears
normal. Diverticula seen throughout the colon. No wall thickening,
adjacent inflammatory change, or evidence of obstruction. Arterial
and venous phase imaging demonstrate no evidence of active GI bleed.

Lymphatic: Aortic atherosclerosis. No enlarged abdominal or pelvic
lymph nodes.

Reproductive: Status post hysterectomy. No adnexal masses.

Other: No abdominal wall hernia or abnormality. No abdominopelvic
ascites.

Musculoskeletal: No acute or significant osseous findings.
IMPRESSION: VASCULAR

No evidence of active GI bleed.

NON-VASCULAR

No CT acute findings in the abdomen or pelvis to explain abdominal
pain.

Numerous diverticula are seen throughout the colon.

## 2021-06-05 MED ORDER — PROCHLORPERAZINE EDISYLATE 10 MG/2ML IJ SOLN
10.0000 mg | Freq: Once | INTRAMUSCULAR | Status: AC
  Administered 2021-06-05: 10 mg via INTRAVENOUS
  Filled 2021-06-05: qty 2

## 2021-06-05 MED ORDER — POTASSIUM CHLORIDE CRYS ER 20 MEQ PO TBCR
40.0000 meq | EXTENDED_RELEASE_TABLET | Freq: Once | ORAL | Status: AC
  Administered 2021-06-05: 40 meq via ORAL
  Filled 2021-06-05: qty 2

## 2021-06-05 MED ORDER — MAGNESIUM OXIDE -MG SUPPLEMENT 400 (240 MG) MG PO TABS
800.0000 mg | ORAL_TABLET | Freq: Once | ORAL | Status: AC
  Administered 2021-06-05: 800 mg via ORAL
  Filled 2021-06-05: qty 2

## 2021-06-05 MED ORDER — HEPARIN SOD (PORK) LOCK FLUSH 100 UNIT/ML IV SOLN
500.0000 [IU] | Freq: Once | INTRAVENOUS | Status: AC
  Administered 2021-06-05: 500 [IU]
  Filled 2021-06-05: qty 5

## 2021-06-05 MED ORDER — IOHEXOL 350 MG/ML SOLN
100.0000 mL | Freq: Once | INTRAVENOUS | Status: AC | PRN
  Administered 2021-06-05: 100 mL via INTRAVENOUS

## 2021-06-05 MED ORDER — DIPHENHYDRAMINE HCL 50 MG/ML IJ SOLN
25.0000 mg | Freq: Once | INTRAMUSCULAR | Status: AC
  Administered 2021-06-05: 25 mg via INTRAVENOUS
  Filled 2021-06-05: qty 1

## 2021-06-05 MED ORDER — ACETAMINOPHEN 500 MG PO TABS
1000.0000 mg | ORAL_TABLET | Freq: Once | ORAL | Status: AC
  Administered 2021-06-05: 1000 mg via ORAL
  Filled 2021-06-05: qty 2

## 2021-06-05 MED ORDER — LACTATED RINGERS IV BOLUS
1000.0000 mL | Freq: Once | INTRAVENOUS | Status: AC
  Administered 2021-06-05: 1000 mL via INTRAVENOUS

## 2021-06-05 NOTE — ED Notes (Signed)
Patient returned from CT

## 2021-06-05 NOTE — ED Provider Notes (Signed)
[facility] DEPT Provider Note   CSN: [id number] Arrival date & time: 06/05/21  1447     History Chief Complaint  Patient presents with   Abdominal Pain   Weakness    Elizabeth Mcintyre is a 80 y.o. 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  Patient states that she was sitting on the porch today into the house and had sudden onset headache, lightheadedness and worsening abdominal pain.  She states her abdominal pain is associated with burgundy colored diarrhea.  Currently on a blood thinner.  Currently denies chest pain, shortness of breath, fever or other systemic symptoms.   Abdominal Pain Associated symptoms: diarrhea   Associated symptoms: no chest pain, no chills, no cough, no dysuria, no fever, no hematuria, no shortness of breath, no sore throat and no vomiting   Weakness Associated symptoms: abdominal pain and diarrhea   Associated symptoms: no arthralgias, no chest pain, no cough, no dysuria, no fever, no seizures, no shortness of breath and no vomiting       Past Medical History:  Diagnosis Date   Asthma    Bloating 04/26/2019   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   Cancer ([place]) 2021   Lymphoma   Chronic SI joint pain    was on tramadol   Depression with anxiety 04/03/2011   DIABETES MELLITUS, TYPE II 11/09/2007   diet control   Diverticulosis 03/2011   Fecal incontinence 07/07/2019   GERD (gastroesophageal reflux disease)    none recently   GI bleed    Gout 06/09/2014   R great toe, ball of foot Stopped HCTZ several mos ago,  On losartan        Hematemesis 03/30/2020   History of rheumatoid arthritis    during 30's, was treated.   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   Lower GI bleeding 04/30/2020    Lymphadenopathy 01/23/2020   Osteopenia 2017   Last  bone density 05/04/2017: -2.4   PONV (postoperative nausea and vomiting)    Schatzki's ring    Stress incontinence    Stroke [facility])    mini stroke - found on a CT scan    Patient Active Problem List   Diagnosis Date Noted   Restless legs syndrome 01/02/2021   Prediabetes 08/14/2020   Mixed hyperlipidemia 08/14/2020   Active chemotherapy 04/30/2020   Dysphagia 04/18/2020   Port-A-Cath in place 03/28/2020   Marginal zone lymphoma ([place]) 03/01/2020   Hepatic steatosis 01/04/2020   Splenomegaly 01/04/2020   Thrombocytopenia ([place]) 01/04/2020   Diarrhea 09/10/2018   Carotid artery stenosis 06/13/2016   Stage 3a chronic kidney disease ([place]) 02/14/2016   Vitamin D deficiency 05/25/2015   Gastroesophageal reflux disease with esophagitis 03/06/2015   Acquired hypothyroidism 05/30/2014   Osteopenia 12/02/2013   Major depression, recurrent, chronic ([place]) 04/03/2011   Mild intermittent asthma 11/09/2007   Essential hypertension, benign 11/09/2007    Past Surgical History:  Procedure Laterality Date   ABDOMINAL HYSTERECTOMY     BALLOON DILATION N/A 08/21/2020   Procedure: BALLOON DILATION;  Surgeon: Eloise Harman, DO;  [facility];  Service: Endoscopy;  Laterality: N/A;   BIOPSY  03/30/2020   Procedure: BIOPSY;  Surgeon: Ronald Lobo, MD;  Location: [facility];  Service: Endoscopy;;   BIOPSY  05/02/2020   Procedure: BIOPSY;  Surgeon: Montez Morita, Quillian Quince, MD;  Location: [facility];  Service: Gastroenterology;;   CARDIAC CATHETERIZATION     X 2, last one in [place] 04/13/2017   Procedure: LAPAROSCOPIC CHOLECYSTECTOMY;  Surgeon: Aviva Signs, MD;  Location: [facility];  Service: General;  Laterality: N/A;   COLONOSCOPY     COLONOSCOPY  May 2012   Dr. Olevia Perches: mild diverticulosis, otherwise normal.    COLONOSCOPY WITH PROPOFOL N/A 05/02/2020   Dr. Jenetta Downer: 8 mm polyp removed from the ascending  colon, 2 mm polyp removed from the ascending colon.  Tubular adenomas.  Diverticulosis.  Mucosal ulceration in the sigmoid colon noted, pathology consistent with ischemic colitis.   ESOPHAGOGASTRODUODENOSCOPY N/A 01/28/2015   Dr. Gala Romney: reflux esophagitis, Schatzki's ring not manipulated due to recent bleeding   ESOPHAGOGASTRODUODENOSCOPY N/A 03/30/2015   Dr. Gala Romney: Schatzki's ring s/p Venia Minks dilation, previously noted esophageal ulcer completely healed   ESOPHAGOGASTRODUODENOSCOPY N/A 03/30/2020   Buccini: Moderately severe erosive, circumferential, confluent esophagitis with no bleeding found 25 to 40 cm from incisors.  Nonobstructing and mild Schatzki ring, there were also multiple distal esophageal rings noted, minimal hiatal hernia.   ESOPHAGOGASTRODUODENOSCOPY (EGD) WITH PROPOFOL N/A 08/21/2020   Procedure: ESOPHAGOGASTRODUODENOSCOPY (EGD) WITH PROPOFOL;  Surgeon: Eloise Harman, DO;  [facility];  Service: Endoscopy;  Laterality: N/A;  2:15pm   IR IMAGING GUIDED PORT INSERTION  03/13/2020   LYMPH NODE BIOPSY Left 03/20/2020   Procedure: LEFT POSTERIOR CERVICAL LYMPH NODE BIOPSY;  Surgeon: Georganna Skeans, MD;  Location: [facility];  Service: General;  Laterality: Left;   MALONEY DILATION N/A 03/30/2015   Procedure: Keturah Shavers;  Surgeon: Daneil Dolin, MD;  [facility];  Service: Endoscopy;  Laterality: N/A;   POLYPECTOMY  05/02/2020   Procedure: POLYPECTOMY;  Surgeon: Harvel Quale, MD;  [facility];  Service: Gastroenterology;;     OB History   No obstetric history on file.     Family History  Problem Relation Age of Onset   Heart disease Mother    Osteoarthritis Mother    Sudden death Father    Single kidney Father    Other Father        h/o severe MVA injuries   Hyperlipidemia Sister    Other Daughter        Myalgias   Fibromyalgia Daughter    Allergies Daughter    Heart disease Maternal Grandfather    Sudden death Paternal  Grandmother    Diabetes Paternal Grandfather    Heart disease Daughter    Other Daughter        palpitations   Pulmonary fibrosis Maternal Aunt    Cancer Paternal Uncle    Pulmonary fibrosis Maternal Aunt    Colon cancer Neg Hx     Social History   Tobacco Use   Smoking status: Never   Smokeless tobacco: Never  Vaping Use   Vaping Use: Never used  Substance Use Topics   Alcohol use: No   Drug use: No    Home Medications Prior to Admission medications   Medication Sig Start Date End Date Taking? Authorizing Provider  acetaminophen (TYLENOL) 325 MG tablet Take 325 mg by mouth every 6 (six) hours as needed for moderate pain.     [provider]  albuterol (VENTOLIN HFA) 108 (90 Base) MCG/ACT inhaler Inhale 2 puffs into the lungs every 6 (six) hours as needed for wheezing. 08/14/20 08/14/21  Kuneff, Renee A, DO  amLODipine (NORVASC) 10 MG tablet Take 1 tablet (10 mg total) by mouth daily. 11/13/20   Kuneff, Renee A, DO  Ascorbic Acid (VITAMIN C) 100 MG tablet Take 100 mg by mouth daily.    [provider]  cetirizine (ZYRTEC) 10 MG tablet Take 10 mg by mouth at bedtime. As needed    [provider]  Cholecalciferol (VITAMIN D3) 25 MCG (1000 UT) CAPS Take 1,000 Units by mouth daily.     [provider]  donepezil (ARICEPT) 10 MG tablet Take 1 tablet daily 03/22/21   Cameron Sprang, MD  levothyroxine (SYNTHROID) 75 MCG tablet Take 1 tablet (75 mcg total) by mouth daily. 02/13/21   Lindell Spar, MD  lidocaine-prilocaine (EMLA) cream Apply 1 application topically as needed. Patient taking differently: Apply 1 application topically as needed (port access). 04/12/20   Orson Slick, MD  pantoprazole (PROTONIX) 40 MG tablet Take 1 tablet (40 mg total) by mouth daily. 11/15/20 11/15/21  Eloise Harman, DO  potassium chloride SA (KLOR-CON) 20 MEQ tablet Take 1 tablet (20 mEq total) by mouth daily. 07/13/20   Orson Slick, MD  rOPINIRole (REQUIP) 0.25  MG tablet Take 1 tablet (0.25 mg total) by mouth at bedtime. 11/13/20   Kuneff, Renee A, DO  rosuvastatin (CRESTOR) 20 MG tablet Take 1 tablet (20 mg total) by mouth daily. 11/14/20   Kuneff, Renee A, DO  sucralfate (CARAFATE) 1 g tablet Take 1 g by mouth 4 (four) times daily -  with meals and at bedtime. As needed    [provider]  vitamin B-12 (CYANOCOBALAMIN) 1000 MCG tablet Take 1,000 mcg by mouth daily.     [provider]    Allergies    Codeine phosphate  Review of Systems   Review of Systems  Constitutional:  Negative for chills and fever.  HENT:  Negative for ear pain and sore throat.   Eyes:  Negative for pain and visual disturbance.  Respiratory:  Negative for cough and shortness of breath.   Cardiovascular:  Negative for chest pain and palpitations.  Gastrointestinal:  Positive for abdominal pain, blood in stool and diarrhea. Negative for vomiting.  Genitourinary:  Negative for dysuria and hematuria.  Musculoskeletal:  Negative for arthralgias and back pain.  Skin:  Negative for color change and rash.  Neurological:  Positive for syncope and weakness. Negative for seizures.  All other systems reviewed and are negative.  Physical Exam Updated Vital Signs BP (!) 153/83   Pulse 94   Temp 97.6 F (36.4 C) (Oral)   Resp 19   Ht 5\' 1"  (1.549 m)   Wt 54.4 kg   SpO2 97%   BMI 22.67 kg/m   Physical Exam Vitals and nursing note reviewed.  Constitutional:      General: She is not in acute distress.    Appearance: She is well-developed.  HENT:     Head: Normocephalic and atraumatic.  Eyes:     Conjunctiva/sclera: Conjunctivae normal.  Cardiovascular:     Rate and Rhythm: Normal rate and regular rhythm.     Heart sounds: No murmur heard. Pulmonary:     Effort: Pulmonary effort is normal. No respiratory distress.     Breath sounds: Normal breath sounds.  Abdominal:     Palpations: Abdomen is soft.     Tenderness: There is abdominal tenderness in the  right lower quadrant and left lower quadrant.  Musculoskeletal:  Cervical back: Neck supple.  Skin:    General: Skin is warm and dry.  Neurological:     Mental Status: She is alert.    ED Results / Procedures / Treatments   Labs (all labs ordered are listed, but only abnormal results are displayed) Labs Reviewed  COMPREHENSIVE METABOLIC PANEL - Abnormal; Notable for the following components:      Result Value   Potassium 2.9 (*)    Glucose, Bld 148 (*)    GFR, Estimated 59 (*)    All other components within normal limits  URINALYSIS, ROUTINE W REFLEX MICROSCOPIC - Abnormal; Notable for the following components:   Color, Urine STRAW (*)    All other components within normal limits  CBC WITH DIFFERENTIAL/PLATELET - Abnormal; Notable for the following components:   Abs Immature Granulocytes 0.09 (*)    All other components within normal limits  LIPASE, BLOOD  BRAIN NATRIURETIC PEPTIDE  TROPONIN I (HIGH SENSITIVITY)  TROPONIN I (HIGH SENSITIVITY)    EKG None  Radiology CT Angio Abd/Pel w/ and/or w/o  Result Date: 06/05/2021 CLINICAL DATA:  Concern for GI bleed or typhlitis EXAM: CTA ABDOMEN AND PELVIS WITHOUT AND WITH CONTRAST TECHNIQUE: Multidetector CT imaging of the abdomen and pelvis was performed using the standard protocol during bolus administration of intravenous contrast. Multiplanar reconstructed images and MIPs were obtained and reviewed to evaluate the vascular anatomy. CONTRAST:  158mL OMNIPAQUE IOHEXOL 350 MG/ML SOLN COMPARISON:  CT abdomen and pelvis dated Jan 30, 2021 FINDINGS: VASCULAR Aorta: Normal caliber with severe calcified and noncalcified plaque. Celiac: Patent with mild narrowing at the origin due to calcified plaque. SMA: Patent with mild narrowing at the origin due to calcified plaque. Renals: Patent. With severe stenosis of the proximal bilateral renal arteries due to calcified plaque. IMA: Patent with narrowing at the origin due to calcified plaque.  Iliofemoral: Iliofemoral vessels are patent with multifocal mild narrowing due to calcified and noncalcified plaque. More severe multifocal narrowing of the bilateral internal iliac arteries. Veins: No venous abnormality. Review of the MIP images confirms the above findings. NON-VASCULAR Lower chest: No acute abnormality. Hepatobiliary: No focal liver abnormality is seen. Status post cholecystectomy. No biliary dilatation. Pancreas: Atrophic. No pancreatic ductal dilatation or surrounding inflammatory changes. Spleen: Normal in size without focal abnormality. Adrenals/Urinary Tract: Bilateral adrenal glands are unremarkable. Kidneys enhance symmetrically with no evidence of hydronephrosis. Simple cyst of the right kidney. Bladder is unremarkable. Stomach/Bowel: Stomach is within normal limits. Appendix appears normal. Diverticula seen throughout the colon. No wall thickening, adjacent inflammatory change, or evidence of obstruction. Arterial and venous phase imaging demonstrate no evidence of active GI bleed. Lymphatic: Aortic atherosclerosis. No enlarged abdominal or pelvic lymph nodes. Reproductive: Status post hysterectomy. No adnexal masses. Other: No abdominal wall hernia or abnormality. No abdominopelvic ascites. Musculoskeletal: No acute or significant osseous findings. IMPRESSION: VASCULAR No evidence of active GI bleed. NON-VASCULAR No CT acute findings in the abdomen or pelvis to explain abdominal pain. Numerous diverticula are seen throughout the colon. Electronically Signed   By: Yetta Glassman M.D.   On: 06/05/2021 18:58    Procedures Procedures   Medications Ordered in ED Medications  potassium chloride SA (KLOR-CON) CR tablet 40 mEq (has no administration in time range)  magnesium oxide (MAG-OX) tablet 800 mg (has no administration in time range)  heparin lock flush 100 unit/mL (has no administration in time range)  acetaminophen (TYLENOL) tablet 1,000 mg (1,000 mg Oral Given 06/05/21 1618)   iohexol (OMNIPAQUE) 350 MG/ML  injection 100 mL (100 mLs Intravenous Contrast Given 06/05/21 1747)  prochlorperazine (COMPAZINE) injection 10 mg (10 mg Intravenous Given 06/05/21 1827)  diphenhydrAMINE (BENADRYL) injection 25 mg (25 mg Intravenous Given 06/05/21 1826)  lactated ringers bolus 1,000 mL (0 mLs Intravenous Stopped 06/05/21 1930)    ED Course  I have reviewed the triage vital signs and the nursing notes.  Pertinent labs & imaging results that were available during my care of the patient were reviewed by me and considered in my medical decision making (see chart for details).    MDM Rules/Calculators/A&P                           Seen the emergency department for evaluation of presyncope, headache and abdominal pain as well as diarrhea.  Physical exam reveals some mild right left lower quadrant tenderness to palpation but is otherwise unremarkable.  Cardiac pulmonary exam unremarkable.  Laboratory evaluation unremarkable outside of mild hypokalemia 2.9.  High sensitive troponin unremarkable.  CT angio abdomen pelvis obtained in the setting of a possible GI bleed which was reassuringly negative for intra-abdominal pathology or an active GI bleed.  ECG nonischemic with a mildly prolonged QT interval and patient was cautioned on taking additional medications that might further prolong this.  Patient able to ambulate without difficulty in the emergency department and potassium was repleted.  Patient also given magnesium in the setting of her mildly prolonged QT interval and to aid potassium absorption.  Patient encouraged to follow with her gastroenterologist to discuss outpatient colonoscopy and was given strict return precautions which she voiced understanding.  Patient then discharged. Final Clinical Impression(s) / ED Diagnoses Final diagnoses:  Abdominal pain, unspecified abdominal location  Gastrointestinal hemorrhage, unspecified gastrointestinal hemorrhage type    Rx / DC  Orders ED Discharge Orders     None        Hassel Uphoff, Debe Coder, MD 06/05/21 [phone]

## 2021-06-05 NOTE — ED Notes (Signed)
Patient transported to CT 

## 2021-06-05 NOTE — Discharge Instructions (Addendum)
You were seen in the emergency department for evaluation of lightheadedness, abdominal pain and a headache.  Your laboratory evaluation was reassuringly normal here in the emergency department.  A CT of your abdomen pelvis was obtained that does not show evidence of an active GI bleed.  At this time, you are safe for discharge and it is important to follow-up with your gastroenterologist to decide whether or not you need another colonoscopy due to the maroon-colored diarrhea you have been having.  At this time you do not require hospital admission, but please return the emergency department if you have new or worsening lower GI bleeding, you pass out, have fever, persistent vomiting, chest pain or shortness of breath, or any other concerning symptoms.

## 2021-06-05 NOTE — ED Triage Notes (Signed)
Pt BIB [facility] from home c/o acute onset episode at 1300 including nausea, abd pain, feeling flushed, head "fuzzy", hands feel weird, chest pain. Chest pain and other sx resolved but guarding abd when EMS arrived. Hx GI bleeds, GERD. EKG negative. VSS. Endorses generalized weakness over the last week.   BP 176/81 HR 80 SpO2 99 RA CBG 127 Temp 97.1

## 2021-06-10 DIAGNOSIS — L281 Prurigo nodularis: Secondary | ICD-10-CM | POA: Diagnosis not present

## 2021-06-18 ENCOUNTER — Ambulatory Visit: Payer: Medicare Other | Admitting: Internal Medicine

## 2021-07-08 ENCOUNTER — Ambulatory Visit (INDEPENDENT_AMBULATORY_CARE_PROVIDER_SITE_OTHER): Payer: Medicare Other | Admitting: Internal Medicine

## 2021-07-08 ENCOUNTER — Encounter: Payer: Self-pay | Admitting: Internal Medicine

## 2021-07-08 ENCOUNTER — Other Ambulatory Visit: Payer: Self-pay

## 2021-07-08 DIAGNOSIS — E039 Hypothyroidism, unspecified: Secondary | ICD-10-CM | POA: Diagnosis not present

## 2021-07-08 DIAGNOSIS — I10 Essential (primary) hypertension: Secondary | ICD-10-CM | POA: Diagnosis not present

## 2021-07-08 DIAGNOSIS — W19XXXA Unspecified fall, initial encounter: Secondary | ICD-10-CM | POA: Diagnosis not present

## 2021-07-08 DIAGNOSIS — Y92009 Unspecified place in unspecified non-institutional (private) residence as the place of occurrence of the external cause: Secondary | ICD-10-CM | POA: Diagnosis not present

## 2021-07-08 DIAGNOSIS — N1831 Chronic kidney disease, stage 3a: Secondary | ICD-10-CM

## 2021-07-08 DIAGNOSIS — D696 Thrombocytopenia, unspecified: Secondary | ICD-10-CM

## 2021-07-08 NOTE — Progress Notes (Signed)
Virtual Visit via Telephone Note   This visit type was conducted due to national recommendations for restrictions regarding the COVID-19 Pandemic (e.g. social distancing) in an effort to limit this patient's exposure and mitigate transmission in our community.  Due to her co-morbid illnesses, this patient is at least at moderate risk for complications without adequate follow up.  This format is felt to be most appropriate for this patient at this time.  The patient did not have access to video technology/had technical difficulties with video requiring transitioning to audio format only (telephone).  All issues noted in this document were discussed and addressed.  No physical exam could be performed with this format.  Evaluation Performed:  Follow-up visit  Date:  07/08/2021   ID:  Lula, Kolton 08/06/1941, MRN [id number]  Patient Location: Home Provider Location: Office/Clinic  Participants: Patient and daughter [name] Location of Patient: Home Location of Provider: Telehealth Consent was obtain for visit to be over via telehealth. I verified that I am speaking with the correct person using two identifiers.  PCP:  Lindell Spar, MD   Chief Complaint: Follow up of chronic medical conditions and recent fall  History of Present Illness:    AUGUSTA MIRKIN is a 80 y.o. female who has a televisit for follow-up of chronic medical conditions and a recent fall yesterday.  She had a fall yesterday at home, where she fell forward and had a lip injury.  She felt light headed before the fall and was confused for a few seconds after the fall.  Her son had get up from the floor.  Did not have any seizure-like movements.  She admits that she does not like eating and had skipped meal yesterday.  HTN: Her BP was elevated during office visit at other provider recently. She has been taking her medications regularly.  Denies any chest pain, dyspnea or palpitations.  Hypothyroidism: Her TSH  was low and her levothyroxine dose was decreased after the last visit.  Her diarrhea has improved now, but does report having diarrhea over the last weekend.  Denies any recent weight change.  The patient does not have symptoms concerning for COVID-19 infection (fever, chills, cough, or new shortness of breath).   Past Medical, Surgical, Social History, Allergies, and Medications have been Reviewed.  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   Lower GI bleeding 04/30/2020   Lymphadenopathy 01/23/2020   Osteopenia 2017   Last  bone density 05/04/2017: -2.4   PONV (postoperative nausea and vomiting)    Schatzki's ring    Stress incontinence    Stroke ([place])    mini stroke - found on  a CT scan   Past Surgical History:  Procedure Laterality Date   ABDOMINAL HYSTERECTOMY     BALLOON DILATION N/A 08/21/2020   Procedure: BALLOON DILATION;  Surgeon: Eloise Harman, DO;  [facility];  Service: Endoscopy;  Laterality: N/A;   BIOPSY  03/30/2020   Procedure: BIOPSY;  Surgeon: Ronald Lobo, MD;  Location: [facility];  Service: Endoscopy;;   BIOPSY  05/02/2020   Procedure: BIOPSY;  Surgeon: Montez Morita, Quillian Quince, MD;  [facility]: [facility];  Service: Gastroenterology;;   CARDIAC CATHETERIZATION     X 2, last one in [place] 04/13/2017   Procedure: LAPAROSCOPIC CHOLECYSTECTOMY;  Surgeon: Aviva Signs, MD;  Location: [facility];  Service: General;  Laterality: N/A;   COLONOSCOPY     COLONOSCOPY  May 2012   Dr. Olevia Perches: mild diverticulosis, otherwise normal.    COLONOSCOPY WITH PROPOFOL N/A 05/02/2020   Dr. Jenetta Downer: 8 mm polyp removed from the ascending colon, 2 mm polyp removed from the ascending colon.  Tubular adenomas.  Diverticulosis.  Mucosal ulceration in the sigmoid colon noted, pathology consistent with ischemic colitis.   ESOPHAGOGASTRODUODENOSCOPY N/A 01/28/2015   Dr. Gala Romney: reflux esophagitis, Schatzki's ring not manipulated due to recent bleeding   ESOPHAGOGASTRODUODENOSCOPY N/A 03/30/2015   Dr. Gala Romney: Schatzki's ring s/p Venia Minks dilation, previously noted esophageal ulcer completely healed   ESOPHAGOGASTRODUODENOSCOPY N/A 03/30/2020   Buccini: Moderately severe erosive, circumferential, confluent esophagitis with no bleeding found 25 to 40 cm from incisors.  Nonobstructing and mild Schatzki ring, there were also multiple distal esophageal rings noted, minimal hiatal hernia.   ESOPHAGOGASTRODUODENOSCOPY (EGD) WITH PROPOFOL N/A 08/21/2020   Procedure: ESOPHAGOGASTRODUODENOSCOPY (EGD) WITH PROPOFOL;  Surgeon: Eloise Harman, DO;  [facility];  Service: Endoscopy;  Laterality: N/A;  2:15pm   IR IMAGING GUIDED PORT INSERTION  03/13/2020   LYMPH NODE BIOPSY Left 03/20/2020   Procedure: LEFT POSTERIOR CERVICAL LYMPH NODE BIOPSY;  Surgeon: Georganna Skeans, MD;  Location: [facility];  Service: General;  Laterality: Left;   MALONEY DILATION N/A 03/30/2015   Procedure: Keturah Shavers;  Surgeon: Daneil Dolin, MD;  [facility];  Service: Endoscopy;  Laterality: N/A;   POLYPECTOMY  05/02/2020   Procedure: POLYPECTOMY;  Surgeon: Montez Morita, Quillian Quince, MD;  [facility];  Service: Gastroenterology;;     Current Meds  Medication Sig   acetaminophen (TYLENOL) 325 MG  tablet Take 325 mg by mouth every 6 (six) hours as needed for moderate pain.    albuterol (VENTOLIN HFA) 108 (90 Base) MCG/ACT inhaler Inhale 2 puffs into the lungs every 6 (six) hours as needed for wheezing.   amLODipine (NORVASC) 10 MG tablet Take 1 tablet (10 mg total) by mouth daily.   Ascorbic Acid (VITAMIN C) 100 MG tablet Take 100 mg by mouth daily.   cetirizine (ZYRTEC) 10 MG tablet Take 10 mg by mouth at bedtime. As needed   Cholecalciferol (VITAMIN D3) 25 MCG (1000 UT) CAPS Take 1,000 Units by mouth daily.    donepezil (ARICEPT) 10 MG tablet Take 1 tablet daily   levothyroxine (SYNTHROID) 75 MCG tablet Take 1 tablet (75 mcg total) by mouth daily.   lidocaine-prilocaine (EMLA) cream Apply 1 application topically as needed. (Patient taking differently: Apply 1 application topically as needed (port access).)   pantoprazole (PROTONIX) 40 MG tablet Take 1 tablet (40 mg total) by mouth daily.   potassium chloride SA (KLOR-CON) 20 MEQ tablet Take 1 tablet (20 mEq total)  by mouth daily.   rOPINIRole (REQUIP) 0.25 MG tablet Take 1 tablet (0.25 mg total) by mouth at bedtime.   rosuvastatin (CRESTOR) 20 MG tablet Take 1 tablet (20 mg total) by mouth daily.   sucralfate (CARAFATE) 1 g tablet Take 1 g by mouth 4 (four) times daily -  with meals and at bedtime. As needed   vitamin B-12 (CYANOCOBALAMIN) 1000 MCG tablet Take 1,000 mcg by mouth daily.      Allergies:   Codeine phosphate   ROS:   Please see the history of present illness.     All other systems reviewed and are negative.   Labs/Other Tests and Data Reviewed:    Recent Labs: 02/12/2021: TSH 0.057 06/05/2021: ALT 12; B Natriuretic Peptide 73.2; BUN 11; Creatinine, Ser 0.98; Hemoglobin 13.6; Platelets 179; Potassium 2.9; Sodium 138   Recent Lipid Panel Lab Results  Component Value Date/Time   CHOL 176 06/08/2019 03:32 PM   TRIG 347 (H) 06/08/2019 03:32 PM   HDL 36 (L) 06/08/2019 03:32 PM   CHOLHDL 4.9 06/08/2019 03:32 PM    LDLCALC 94 06/08/2019 03:32 PM   LDLDIRECT 155.0 11/13/2020 11:36 AM    Wt Readings from Last 3 Encounters:  06/05/21 120 lb (54.4 kg)  05/09/21 127 lb (57.6 kg)  03/07/21 125 lb 3.2 oz (56.8 kg)     ASSESSMENT & PLAN:    Fall/syncope at home Head face injury, bruising around the lip -no UE or LE major injury No major head injury or bruising reported Appears to be related to dehydration causing syncope Advised for proper hydration and eating at regular intervals Will try to schedule in office appointment for better evaluation  Essential hypertension, benign BP Readings from Last 1 Encounters:  06/05/21 (!) 149/77   Needs in office evaluation Was well-controlled with Amlodipine 10 mg QD in the past Counseled for compliance with the medications Advised DASH diet  Acquired hypothyroidism Lab Results  Component Value Date   TSH 0.057 (L) 02/12/2021   On Levothyroxine 75 mcg QD Will check TSH and free T4  Stage 3a chronic kidney disease ([place]) Last CMP reviewed - needs to improve hydration Stable Avoid nephrotoxic agents  Thrombocytopenia ([place]) Had improved Will recheck CBC due to recent fall   Time:   Today, I have spent 25 minutes reviewing the chart, including problem list, medications, and with the patient with telehealth technology discussing the above problems.   Medication Adjustments/Labs and Tests Ordered: Current medicines are reviewed at length with the patient today.  Concerns regarding medicines are outlined above.   Tests Ordered: No orders of the defined types were placed in this encounter.   Medication Changes: No orders of the defined types were placed in this encounter.    Note: This dictation was prepared with Dragon dictation along with smaller phrase technology. Similar sounding words can be transcribed inadequately or may not be corrected upon review. Any transcriptional errors that result from this process are unintentional.       Disposition:  Follow up  Signed, Lindell Spar, MD  07/08/2021 2:17 PM     [facility]

## 2021-07-08 NOTE — Assessment & Plan Note (Signed)
Had improved Will recheck CBC due to recent fall

## 2021-07-08 NOTE — Assessment & Plan Note (Signed)
Last CMP reviewed - needs to improve hydration Stable Avoid nephrotoxic agents

## 2021-07-08 NOTE — Patient Instructions (Signed)
Please get blood tests within 2-3 days.  Please eat at regular intervals and take at least 64 ounces of fluid in a day.  Continue to take medications as prescribed.

## 2021-07-08 NOTE — Assessment & Plan Note (Signed)
BP Readings from Last 1 Encounters:  06/05/21 (!) 149/77   Needs in office evaluation Was well-controlled with Amlodipine 10 mg QD in the past Counseled for compliance with the medications Advised DASH diet

## 2021-07-08 NOTE — Assessment & Plan Note (Signed)
Lab Results  Component Value Date   TSH 0.057 (L) 02/12/2021   On Levothyroxine 75 mcg QD Will check TSH and free T4

## 2021-07-22 ENCOUNTER — Encounter: Payer: Self-pay | Admitting: Internal Medicine

## 2021-07-22 ENCOUNTER — Other Ambulatory Visit: Payer: Self-pay

## 2021-07-22 ENCOUNTER — Ambulatory Visit (INDEPENDENT_AMBULATORY_CARE_PROVIDER_SITE_OTHER): Payer: Medicare Other | Admitting: Internal Medicine

## 2021-07-22 VITALS — BP 147/70 | HR 66 | Resp 18 | Ht 61.0 in | Wt 123.1 lb

## 2021-07-22 DIAGNOSIS — F339 Major depressive disorder, recurrent, unspecified: Secondary | ICD-10-CM

## 2021-07-22 DIAGNOSIS — W19XXXD Unspecified fall, subsequent encounter: Secondary | ICD-10-CM

## 2021-07-22 DIAGNOSIS — N1831 Chronic kidney disease, stage 3a: Secondary | ICD-10-CM | POA: Diagnosis not present

## 2021-07-22 DIAGNOSIS — I10 Essential (primary) hypertension: Secondary | ICD-10-CM | POA: Diagnosis not present

## 2021-07-22 DIAGNOSIS — W19XXXA Unspecified fall, initial encounter: Secondary | ICD-10-CM | POA: Insufficient documentation

## 2021-07-22 DIAGNOSIS — Z780 Asymptomatic menopausal state: Secondary | ICD-10-CM

## 2021-07-22 DIAGNOSIS — D696 Thrombocytopenia, unspecified: Secondary | ICD-10-CM | POA: Diagnosis not present

## 2021-07-22 DIAGNOSIS — E039 Hypothyroidism, unspecified: Secondary | ICD-10-CM | POA: Diagnosis not present

## 2021-07-22 DIAGNOSIS — Z23 Encounter for immunization: Secondary | ICD-10-CM | POA: Diagnosis not present

## 2021-07-22 HISTORY — DX: Unspecified fall, initial encounter: W19.XXXA

## 2021-07-22 MED ORDER — MIRTAZAPINE 7.5 MG PO TABS: 7.5000 mg | ORAL_TABLET | Freq: Every day | ORAL | 3 refills | Status: DC

## 2021-07-22 NOTE — Assessment & Plan Note (Signed)
Check CMP - needs to improve hydration Stable Avoid nephrotoxic agents

## 2021-07-22 NOTE — Assessment & Plan Note (Signed)
[place] Office Visit from 07/22/2021 in [facility]  PHQ-9 Total Score 10     Also reports insomnia and decreased appetite, started Remeron 7.5 mg QD

## 2021-07-22 NOTE — Assessment & Plan Note (Signed)
BP Readings from Last 1 Encounters:  07/22/21 (!) 147/70   Has not taken Amlodipine today Overall well-controlled with Amlodipine 10 mg QD in the past Counseled for compliance with the medications Advised DASH diet

## 2021-07-22 NOTE — Patient Instructions (Signed)
Please start taking Mirtazepine 7.5 mg at bedtime for insomnia and to improve appetite.  Please eat at regular intervals and take at least 64 ounces of fluid in a day. Continue protein supplements.  Continue to take other medications as prescribed.

## 2021-07-22 NOTE — Progress Notes (Signed)
Established Patient Office Visit  Subjective:  Patient ID: Elizabeth Mcintyre, female    DOB: [birth year]  Age: 80 y.o. MRN: [id number]  CC:  Chief Complaint  Patient presents with   Follow-up    2 week follow up from fall getting better still gets dizzy sometimes when standing and still has pain under rib cage     HPI Elizabeth Mcintyre is a 80 y.o. 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  HTN: Her BP was mildly elevated today, but she has not had her amlodipine today.Patient denies headache, dizziness, chest pain, dyspnea or palpitations.   Hypothyroidism: Takes Levothyroxine regularly. Denies any tremors or palpitations.  Due for TSH and free T4 testing.  She had a fall about 2 weeks ago, and still has mild right-sided rib cage pain, which is dull and worse with deep breathing.  She denies any bruising in that area.  She states that she has been trying to eat at regular intervals and takes protein supplements as well.  Her p.o. intake of fluids is poor, but she is trying to improve it slowly.  She also reports insomnia and depressed mood at times.  Denies anxiety, SI or HI currently.  She used to take Prozac for depression in the past.  She received flu vaccine in the office today.   Past Medical History:  Diagnosis Date   Asthma    Bloating 04/26/2019   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   Cancer ([place]) 2021   Lymphoma   Chronic SI joint pain    was on tramadol   Depression with anxiety 04/03/2011   DIABETES MELLITUS, TYPE II 11/09/2007   diet control   Diverticulosis 03/2011   Fecal incontinence 07/07/2019   GERD (gastroesophageal reflux disease)    none recently   GI bleed    Gout 06/09/2014   R great toe, ball of foot Stopped HCTZ several mos ago,  On losartan         Hematemesis 03/30/2020   History of rheumatoid arthritis    during 30's, was treated.   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   Lower GI bleeding 04/30/2020   Lymphadenopathy 01/23/2020   Osteopenia 2017   Last  bone density 05/04/2017: -2.4   PONV (postoperative nausea and vomiting)    Schatzki's ring    Stress incontinence    Stroke [facility])    mini stroke - found on a CT scan    Past Surgical History:  Procedure Laterality Date   ABDOMINAL HYSTERECTOMY     BALLOON DILATION N/A 08/21/2020   Procedure: BALLOON DILATION;  Surgeon: Eloise Harman, DO;  [facility];  Service: Endoscopy;  Laterality: N/A;   BIOPSY  03/30/2020   Procedure: BIOPSY;  Surgeon: Ronald Lobo, MD;  Location: [facility];  Service: Endoscopy;;   BIOPSY  05/02/2020   Procedure: BIOPSY;  Surgeon: Harvel Quale, MD;  Location: [facility];  Service: Gastroenterology;;   CARDIAC CATHETERIZATION     X 2, last one in [place] 04/13/2017   Procedure: LAPAROSCOPIC CHOLECYSTECTOMY;  Surgeon: Aviva Signs, MD;  Location: [facility];  Service: General;  Laterality: N/A;   COLONOSCOPY     COLONOSCOPY  May 2012  Dr. Olevia Perches: mild diverticulosis, otherwise normal.    COLONOSCOPY WITH PROPOFOL N/A 05/02/2020   Dr. Jenetta Downer: 8 mm polyp removed from the ascending colon, 2 mm polyp removed from the ascending colon.  Tubular adenomas.  Diverticulosis.  Mucosal ulceration in the sigmoid colon noted, pathology consistent with ischemic colitis.   ESOPHAGOGASTRODUODENOSCOPY N/A 01/28/2015   Dr. Gala Romney: reflux esophagitis, Schatzki's ring not manipulated due to recent bleeding   ESOPHAGOGASTRODUODENOSCOPY N/A 03/30/2015   Dr. Gala Romney: Schatzki's ring s/p Venia Minks dilation, previously noted esophageal ulcer completely healed   ESOPHAGOGASTRODUODENOSCOPY N/A 03/30/2020   Buccini: Moderately severe erosive,  circumferential, confluent esophagitis with no bleeding found 25 to 40 cm from incisors.  Nonobstructing and mild Schatzki ring, there were also multiple distal esophageal rings noted, minimal hiatal hernia.   ESOPHAGOGASTRODUODENOSCOPY (EGD) WITH PROPOFOL N/A 08/21/2020   Procedure: ESOPHAGOGASTRODUODENOSCOPY (EGD) WITH PROPOFOL;  Surgeon: Eloise Harman, DO;  [facility];  Service: Endoscopy;  Laterality: N/A;  2:15pm   IR IMAGING GUIDED PORT INSERTION  03/13/2020   LYMPH NODE BIOPSY Left 03/20/2020   Procedure: LEFT POSTERIOR CERVICAL LYMPH NODE BIOPSY;  Surgeon: Georganna Skeans, MD;  Location: [place];  Service: General;  Laterality: Left;   MALONEY DILATION N/A 03/30/2015   Procedure: Keturah Shavers;  Surgeon: Daneil Dolin, MD;  [facility];  Service: Endoscopy;  Laterality: N/A;   POLYPECTOMY  05/02/2020   Procedure: POLYPECTOMY;  Surgeon: Montez Morita, Quillian Quince, MD;  [facility];  Service: Gastroenterology;;    Family History  Problem Relation Age of Onset   Heart disease Mother    Osteoarthritis Mother    Sudden death Father    Single kidney Father    Other Father        h/o severe MVA injuries   Hyperlipidemia Sister    Other Daughter        Myalgias   Fibromyalgia Daughter    Allergies Daughter    Heart disease Maternal Grandfather    Sudden death Paternal Grandmother    Diabetes Paternal Grandfather    Heart disease Daughter    Other Daughter        palpitations   Pulmonary fibrosis Maternal Aunt    Cancer Paternal Uncle    Pulmonary fibrosis Maternal Aunt    Colon cancer Neg Hx     Social History   Socioeconomic History   Marital status: Widowed    Spouse name: Not on file   Number of children: 2   Years of education: Not on file   Highest education level: Not on file  Occupational History   Occupation: retired  Tobacco Use   Smoking status: Never   Smokeless tobacco: Never  Vaping Use   Vaping Use: Never used   Substance and Sexual Activity   Alcohol use: No   Drug use: No   Sexual activity: Never  Other Topics Concern   Not on file  Social History Narrative   Ms. Sortino is widowed. Her young grandson lives with her, for whom she shares custody with her daughter, the son's aunt.     Right handed    Social Determinants of Health   Financial Resource Strain: Low Risk    Difficulty of Paying Living Expenses: Not hard at all  Food Insecurity: No Food Insecurity   Worried About Charity fundraiser in the Last Year: Never true   Ran Out of Food in the Last Year: Never true  Transportation Needs: No Transportation Needs  Lack of Transportation (Medical): No   Lack of Transportation (Non-Medical): No  Physical Activity: Insufficiently Active   Days of Exercise per Week: 5 days   Minutes of Exercise per Session: 10 min  Stress: No Stress Concern Present   Feeling of Stress : Only a little  Social Connections: Socially Isolated   Frequency of Communication with Friends and Family: More than three times a week   Frequency of Social Gatherings with Friends and Family: More than three times a week   Attends Religious Services: Never   Marine scientist or Organizations: No   Attends Archivist Meetings: Never   Marital Status: Widowed  Human resources officer Violence: Not At Risk   Fear of Current or Ex-Partner: No   Emotionally Abused: No   Physically Abused: No   Sexually Abused: No    Outpatient Medications Prior to Visit  Medication Sig Dispense Refill   acetaminophen (TYLENOL) 325 MG tablet Take 325 mg by mouth every 6 (six) hours as needed for moderate pain.      albuterol (VENTOLIN HFA) 108 (90 Base) MCG/ACT inhaler Inhale 2 puffs into the lungs every 6 (six) hours as needed for wheezing. 8.5 g 2   amLODipine (NORVASC) 10 MG tablet Take 1 tablet (10 mg total) by mouth daily. 90 tablet 1   Ascorbic Acid (VITAMIN C) 100 MG tablet Take 100 mg by mouth daily.     cetirizine  (ZYRTEC) 10 MG tablet Take 10 mg by mouth at bedtime. As needed     Cholecalciferol (VITAMIN D3) 25 MCG (1000 UT) CAPS Take 1,000 Units by mouth daily.      donepezil (ARICEPT) 10 MG tablet Take 1 tablet daily 30 tablet 11   levothyroxine (SYNTHROID) 75 MCG tablet Take 1 tablet (75 mcg total) by mouth daily. 30 tablet 3   lidocaine-prilocaine (EMLA) cream Apply 1 application topically as needed. (Patient taking differently: Apply 1 application topically as needed (port access).) 30 g 1   pantoprazole (PROTONIX) 40 MG tablet Take 1 tablet (40 mg total) by mouth daily. 90 tablet 3   potassium chloride SA (KLOR-CON) 20 MEQ tablet Take 1 tablet (20 mEq total) by mouth daily. 21 tablet 0   rOPINIRole (REQUIP) 0.25 MG tablet Take 1 tablet (0.25 mg total) by mouth at bedtime. 90 tablet 1   rosuvastatin (CRESTOR) 20 MG tablet Take 1 tablet (20 mg total) by mouth daily. 90 tablet 3   sucralfate (CARAFATE) 1 g tablet Take 1 g by mouth 4 (four) times daily -  with meals and at bedtime. As needed     vitamin B-12 (CYANOCOBALAMIN) 1000 MCG tablet Take 1,000 mcg by mouth daily.      No facility-administered medications prior to visit.    Allergies  Allergen Reactions   Codeine Phosphate Nausea And Vomiting and Rash    ROS Review of Systems  Constitutional:  Positive for appetite change and fatigue. Negative for chills and fever.  HENT:  Negative for congestion, sinus pressure, sinus pain and sore throat.   Eyes:  Positive for visual disturbance. Negative for pain and discharge.  Respiratory:  Negative for cough and shortness of breath.   Cardiovascular:  Negative for chest pain and palpitations.  Gastrointestinal:  Negative for abdominal pain, constipation, nausea and vomiting.  Endocrine: Negative for polydipsia and polyuria.  Genitourinary:  Negative for dysuria and hematuria.  Musculoskeletal:  Negative for neck pain and neck stiffness.  Skin:  Negative for rash.  Neurological:  Positive  for  headaches. Negative for dizziness and weakness.  Psychiatric/Behavioral:  Positive for dysphoric mood and sleep disturbance. Negative for agitation and behavioral problems.      Objective:    Physical Exam Vitals reviewed.  Constitutional:      General: She is not in acute distress.    Appearance: She is not diaphoretic.  HENT:     Head: Normocephalic and atraumatic.     Nose: Nose normal.     Mouth/Throat:     Mouth: Mucous membranes are moist.  Eyes:     General: No scleral icterus.    Extraocular Movements: Extraocular movements intact.  Cardiovascular:     Rate and Rhythm: Normal rate and regular rhythm.     Pulses: Normal pulses.     Heart sounds: Normal heart sounds. No murmur heard. Pulmonary:     Breath sounds: Normal breath sounds. No wheezing or rales.  Musculoskeletal:     Cervical back: Neck supple. No tenderness.     Right lower leg: No edema.     Left lower leg: No edema.  Skin:    General: Skin is warm.     Findings: No rash.  Neurological:     General: No focal deficit present.     Mental Status: She is alert and oriented to person, place, and time.     Sensory: No sensory deficit.     Motor: Weakness (B/l LE) present.  Psychiatric:        Mood and Affect: Mood normal.        Behavior: Behavior normal.    BP (!) 147/70 (BP Location: Left Arm, Patient Position: Sitting, Cuff Size: Normal)   Pulse 66   Resp 18   Ht _0  (1.549 m)   Wt 123 lb 1.3 oz (55.8 kg)   SpO2 99%   BMI 23.26 kg/m  Wt Readings from Last 3 Encounters:  07/22/21 123 lb 1.3 oz (55.8 kg)  06/05/21 120 lb (54.4 kg)  05/09/21 127 lb (57.6 kg)    Lab Results  Component Value Date   TSH 0.057 (L) 02/12/2021   Lab Results  Component Value Date   WBC 9.4 06/05/2021   HGB 13.6 06/05/2021   HCT 40.0 06/05/2021   MCV 87.1 06/05/2021   PLT 179 06/05/2021   Lab Results  Component Value Date   NA 138 06/05/2021   K 2.9 (L) 06/05/2021   CO2 24 06/05/2021   GLUCOSE 148 (H)  06/05/2021   BUN 11 06/05/2021   CREATININE 0.98 06/05/2021   BILITOT 0.8 06/05/2021   ALKPHOS 83 06/05/2021   AST 20 06/05/2021   ALT 12 06/05/2021   PROT 7.3 06/05/2021   ALBUMIN 4.5 06/05/2021   CALCIUM 9.3 06/05/2021   ANIONGAP 10 06/05/2021   EGFR 41 (L) 02/12/2021   GFR 33.87 (L) 12/29/2019   Lab Results  Component Value Date   CHOL 176 06/08/2019   Lab Results  Component Value Date   HDL 36 (L) 06/08/2019   Lab Results  Component Value Date   LDLCALC 94 06/08/2019   Lab Results  Component Value Date   TRIG 347 (H) 06/08/2019   Lab Results  Component Value Date   CHOLHDL 4.9 06/08/2019   Lab Results  Component Value Date   HGBA1C 4.9 11/13/2020      Assessment & Plan:   Problem List Items Addressed This Visit       Cardiovascular and Mediastinum   Essential hypertension, benign    BP  Readings from Last 1 Encounters:  07/22/21 (!) 147/70  Has not taken Amlodipine today Overall well-controlled with Amlodipine 10 mg QD in the past Counseled for compliance with the medications Advised DASH diet        Genitourinary   Stage 3a chronic kidney disease ([place])    Check CMP - needs to improve hydration Stable Avoid nephrotoxic agents        Other   Major depression, recurrent, chronic ([place]) - Primary    Flowsheet Row Office Visit from 07/22/2021 in [facility]  PHQ-9 Total Score 10  Also reports insomnia and decreased appetite, started Remeron 7.5 mg QD      Relevant Medications   mirtazapine (REMERON) 7.5 MG tablet   Fall    Had a fall at home related to dehydration Has improved dietary habits, takes protein supplements and improving PO fluid intake now Complains of right sided rib cage pain, check x-ray of the ribs      Relevant Orders   DG Ribs Unilateral Right   Other Visit Diagnoses     Post-menopausal       Relevant Orders   DG Bone Density   Need for immunization against influenza       Relevant Orders   Flu Vaccine  QUAD High Dose(Fluad) (Completed)       Meds ordered this encounter  Medications   mirtazapine (REMERON) 7.5 MG tablet    Sig: Take 1 tablet (7.5 mg total) by mouth at bedtime.    Dispense:  30 tablet    Refill:  3    Follow-up: Return in about 3 months (around 10/22/2021).    Lindell Spar, MD

## 2021-07-22 NOTE — Assessment & Plan Note (Signed)
Had a fall at home related to dehydration Has improved dietary habits, takes protein supplements and improving PO fluid intake now Complains of right sided rib cage pain, check x-ray of the ribs

## 2021-07-23 ENCOUNTER — Encounter: Payer: Self-pay | Admitting: Hematology and Oncology

## 2021-07-23 LAB — CMP14+EGFR
ALT: 11 IU/L (ref 0–32)
AST: 14 IU/L (ref 0–40)
Albumin/Globulin Ratio: 2.9 — ABNORMAL HIGH (ref 1.2–2.2)
Albumin: 4.7 g/dL (ref 3.7–4.7)
Alkaline Phosphatase: 88 IU/L (ref 44–121)
BUN/Creatinine Ratio: 39 — ABNORMAL HIGH (ref 12–28)
BUN: 38 mg/dL — ABNORMAL HIGH (ref 8–27)
Bilirubin Total: 0.3 mg/dL (ref 0.0–1.2)
CO2: 26 mmol/L (ref 20–29)
Calcium: 9.6 mg/dL (ref 8.7–10.3)
Chloride: 102 mmol/L (ref 96–106)
Creatinine, Ser: 0.98 mg/dL (ref 0.57–1.00)
Globulin, Total: 1.6 g/dL (ref 1.5–4.5)
Glucose: 94 mg/dL (ref 70–99)
Potassium: 4 mmol/L (ref 3.5–5.2)
Sodium: 142 mmol/L (ref 134–144)
Total Protein: 6.3 g/dL (ref 6.0–8.5)
eGFR: 59 mL/min/{1.73_m2} — ABNORMAL LOW (ref >59)

## 2021-07-23 LAB — CBC WITH DIFFERENTIAL/PLATELET
Basophils Absolute: 0 10*3/uL (ref 0.0–0.2)
Basos: 1 %
EOS (ABSOLUTE): 0.3 10*3/uL (ref 0.0–0.4)
Eos: 3 %
Hematocrit: 37.9 % (ref 34.0–46.6)
Hemoglobin: 12.9 g/dL (ref 11.1–15.9)
Immature Grans (Abs): 0 10*3/uL (ref 0.0–0.1)
Immature Granulocytes: 0 %
Lymphocytes Absolute: 2.5 10*3/uL (ref 0.7–3.1)
Lymphs: 28 %
MCH: 30 pg (ref 26.6–33.0)
MCHC: 34 g/dL (ref 31.5–35.7)
MCV: 88 fL (ref 79–97)
Monocytes Absolute: 0.8 10*3/uL (ref 0.1–0.9)
Monocytes: 9 %
Neutrophils Absolute: 5.1 10*3/uL (ref 1.4–7.0)
Neutrophils: 59 %
Platelets: 158 10*3/uL (ref 150–450)
RBC: 4.3 x10E6/uL (ref 3.77–5.28)
RDW: 14.4 % (ref 11.7–15.4)
WBC: 8.7 10*3/uL (ref 3.4–10.8)

## 2021-07-23 LAB — TSH+FREE T4
Free T4: 1.25 ng/dL (ref 0.82–1.77)
TSH: 0.622 u[IU]/mL (ref 0.450–4.500)

## 2021-07-30 ENCOUNTER — Other Ambulatory Visit (HOSPITAL_COMMUNITY): Payer: Medicare Other

## 2021-08-05 ENCOUNTER — Inpatient Hospital Stay: Payer: Medicare Other

## 2021-08-05 ENCOUNTER — Other Ambulatory Visit: Payer: Self-pay

## 2021-08-05 ENCOUNTER — Inpatient Hospital Stay: Payer: Medicare Other | Attending: Hematology and Oncology | Admitting: Hematology and Oncology

## 2021-08-05 ENCOUNTER — Other Ambulatory Visit: Payer: Self-pay | Admitting: Hematology and Oncology

## 2021-08-05 VITALS — BP 179/71 | HR 69 | Temp 97.6°F | Resp 17 | Wt 126.6 lb

## 2021-08-05 DIAGNOSIS — C8580 Other specified types of non-Hodgkin lymphoma, unspecified site: Secondary | ICD-10-CM | POA: Diagnosis not present

## 2021-08-05 DIAGNOSIS — Z95828 Presence of other vascular implants and grafts: Secondary | ICD-10-CM

## 2021-08-05 DIAGNOSIS — C8514 Unspecified B-cell lymphoma, lymph nodes of axilla and upper limb: Secondary | ICD-10-CM | POA: Insufficient documentation

## 2021-08-05 LAB — CMP (CANCER CENTER ONLY)
ALT: 16 U/L (ref 0–44)
AST: 18 U/L (ref 15–41)
Albumin: 4.1 g/dL (ref 3.5–5.0)
Alkaline Phosphatase: 77 U/L (ref 38–126)
Anion gap: 10 (ref 5–15)
BUN: 31 mg/dL — ABNORMAL HIGH (ref 8–23)
CO2: 23 mmol/L (ref 22–32)
Calcium: 9.1 mg/dL (ref 8.9–10.3)
Chloride: 106 mmol/L (ref 98–111)
Creatinine: 1.24 mg/dL — ABNORMAL HIGH (ref 0.44–1.00)
GFR, Estimated: 44 mL/min — ABNORMAL LOW (ref >60)
Glucose, Bld: 98 mg/dL (ref 70–99)
Potassium: 3.7 mmol/L (ref 3.5–5.1)
Sodium: 139 mmol/L (ref 135–145)
Total Bilirubin: 0.3 mg/dL (ref 0.3–1.2)
Total Protein: 6.7 g/dL (ref 6.5–8.1)

## 2021-08-05 LAB — CBC WITH DIFFERENTIAL (CANCER CENTER ONLY)
Abs Immature Granulocytes: 0.03 10*3/uL (ref 0.00–0.07)
Basophils Absolute: 0.1 10*3/uL (ref 0.0–0.1)
Basophils Relative: 1 %
Eosinophils Absolute: 0.4 10*3/uL (ref 0.0–0.5)
Eosinophils Relative: 6 %
HCT: 33.5 % — ABNORMAL LOW (ref 36.0–46.0)
Hemoglobin: 12 g/dL (ref 12.0–15.0)
Immature Granulocytes: 1 %
Lymphocytes Relative: 31 %
Lymphs Abs: 2 10*3/uL (ref 0.7–4.0)
MCH: 31.3 pg (ref 26.0–34.0)
MCHC: 35.8 g/dL (ref 30.0–36.0)
MCV: 87.5 fL (ref 80.0–100.0)
Monocytes Absolute: 0.7 10*3/uL (ref 0.1–1.0)
Monocytes Relative: 11 %
Neutro Abs: 3.3 10*3/uL (ref 1.7–7.7)
Neutrophils Relative %: 50 %
Platelet Count: 160 10*3/uL (ref 150–400)
RBC: 3.83 MIL/uL — ABNORMAL LOW (ref 3.87–5.11)
RDW: 13.8 % (ref 11.5–15.5)
WBC Count: 6.4 10*3/uL (ref 4.0–10.5)
nRBC: 0 % (ref 0.0–0.2)

## 2021-08-05 LAB — LACTATE DEHYDROGENASE: LDH: 262 U/L — ABNORMAL HIGH (ref 98–192)

## 2021-08-05 NOTE — Progress Notes (Signed)
[place] [phone]   [phone]  PROGRESS NOTE  Patient Care Team: Lindell Spar, MD as PCP - General (Internal Medicine) Danie Binder, MD (Inactive) as Consulting Physician (Gastroenterology) Minus Breeding, MD as Consulting Physician (Cardiology) Annitta Needs, NP (Gastroenterology) Carlis Stable, NP as Nurse Practitioner (Gastroenterology) Montez Morita, Quillian Quince, MD as Consulting Physician (Gastroenterology) Eloise Harman, DO as Consulting Physician (Internal Medicine) Cameron Sprang, MD as Consulting Physician (Neurology)  Hematological/Oncological History  # Low Grade Marginal Zone Lymphoma, Stage III 1) 06/06/2019: patient underwent a diagnostic mammogram of the left breast. Calcifications noted in the left breast, recommended 6 month f/u imaging. 2) 02/02/2020: bilateral diagnostic mammogram showed abnormal enlarged lymph nodes with cortical thickening. The largest of these measures 2.2 x 1.7 x 2.0 cm.  3) 02/02/2020: US guided biopsy of left axillary lymph nodes performed. 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 The overall features are atypical and highly suspicious for non-Hodgkin B-cell lymphoma, particularly marginal zone lymphoma. 4) 02/16/2020: establish care with Dr. Lorenso Courier  5) 02/29/2020: PET CT scan performed, showed hypermetabolic lymph nodes identified in the posterior left neck, both axillary/subpectoral regions, mediastinum, hila, right external iliac chain, and right groin. Massive splenomegaly of 17.8 cm noted as well.  6) 03/20/2020: excisional biopsy of left posterior cervical lymph node. Biopsy confirmed most likely low grade marginal zone lymphoma.   7) 7/14-7/15/2021: Cycle 1 Day 1 of Rituximab/Bendamycin 8) 03/30/2020-04/01/2020: admitted inpatient for hematemesis. Noted to have marked esophagitis on EGD.  9) 04/25/2020: IV feraheme 510mg   administered 10) 04/26/2020: Cycle 2 Day 1 of Rituximab/Bendamycin 11) 05/10/2020: HOLD R-Benda in setting of colitis, GI bleed, and intolerance to therapy. Plan to start monotherapy Rituximab once patient has rebounded.  12) 07/06/2020: Cycle 1 Week 1 of monotherapy rituximab 13) 07/30/2020: Cycle 1 Week 3 of monotherapy rituximab 14) 08/06/2020: Completed Cycle 1 of monotherapy rituximab 15)  09/12/2020: PET CT scan shows completed resolution of tumor and FDG avid lesions. Deauville Score 1. Complete response.  16) 11/16/2020: Cycle 1 Day 1 of Maintenance Rituximab  17) 01/10/2021:  Cycle 2 Day 1 of Maintenance Rituximab  18) 03/07/2021: Cycle 3 Day 1 of Maintenance Rituximab  19) 05/09/2021: Cycle 4 Day 1 of Maintenance Rituximab   Interval History:  Elizabeth Mcintyre 80 y.o. 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   On exam today, Mrs. Malta reports she has been doing well overall in the interim since her last visit.  She reports that she is under stress right now she is trying to sell her house and [place] and moved to [place].she reports that her weight has been steady and her appetite has been good.  She denies any overt signs of new lymphadenopathy.  She denies any fevers, chills, night sweats, shortness of breath, chest pain, cough, neuropathy or skin changes. The rest of the 10 point ROS is negative.    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   Lower GI bleeding 04/30/2020   Lymphadenopathy 01/23/2020   Osteopenia 2017   Last  bone density 05/04/2017: -2.4   PONV (postoperative nausea and vomiting)    Schatzki's ring    Stress incontinence    Stroke ([place])    mini stroke - found on a CT scan    SURGICAL HISTORY: Past Surgical History:  Procedure Laterality Date   ABDOMINAL HYSTERECTOMY     BALLOON DILATION N/A 08/21/2020   Procedure: BALLOON DILATION;  Surgeon: Eloise Harman, DO;  [facility];  Service: Endoscopy;  Laterality: N/A;   BIOPSY  03/30/2020   Procedure: BIOPSY;  Surgeon: Ronald Lobo, MD;  Location: [facility];  Service: Endoscopy;;   BIOPSY  05/02/2020   Procedure: BIOPSY;  Surgeon: Montez Morita, Quillian Quince, MD;  Location: [facility];  Service: Gastroenterology;;   CARDIAC CATHETERIZATION     X 2, last one in [place] 04/13/2017   Procedure: LAPAROSCOPIC CHOLECYSTECTOMY;  Surgeon: Aviva Signs, MD;  Location: [facility];  Service: General;  Laterality: N/A;   COLONOSCOPY     COLONOSCOPY  May 2012   Dr. Olevia Perches: mild diverticulosis, otherwise normal.    COLONOSCOPY WITH PROPOFOL N/A 05/02/2020   Dr. Jenetta Downer: 8 mm polyp removed from the ascending colon, 2 mm polyp removed from the ascending colon.  Tubular adenomas.  Diverticulosis.  Mucosal ulceration in the sigmoid colon noted, pathology consistent with ischemic colitis.   ESOPHAGOGASTRODUODENOSCOPY N/A 01/28/2015   Dr. Gala Romney: reflux esophagitis, Schatzki's ring not manipulated due to recent bleeding   ESOPHAGOGASTRODUODENOSCOPY N/A 03/30/2015   Dr. Gala Romney: Schatzki's ring s/p Venia Minks dilation, previously noted esophageal ulcer completely healed   ESOPHAGOGASTRODUODENOSCOPY N/A 03/30/2020   Buccini: Moderately  severe erosive, circumferential, confluent esophagitis with no bleeding found 25 to 40 cm from incisors.  Nonobstructing and mild Schatzki ring, there were also multiple distal esophageal rings noted, minimal hiatal hernia.   ESOPHAGOGASTRODUODENOSCOPY (EGD) WITH PROPOFOL N/A 08/21/2020   Procedure: ESOPHAGOGASTRODUODENOSCOPY (EGD) WITH PROPOFOL;  Surgeon: Eloise Harman, DO;  [facility];  Service: Endoscopy;  Laterality: N/A;  2:15pm   IR IMAGING GUIDED PORT INSERTION  03/13/2020   LYMPH NODE BIOPSY Left 03/20/2020   Procedure: LEFT POSTERIOR CERVICAL LYMPH NODE BIOPSY;  Surgeon: Georganna Skeans, MD;  Location: [facility];  Service: General;  Laterality: Left;   MALONEY DILATION N/A 03/30/2015   Procedure: Keturah Shavers;  Surgeon: Daneil Dolin, MD;  [facility];  Service: Endoscopy;  Laterality: N/A;   POLYPECTOMY  05/02/2020   Procedure: POLYPECTOMY;  Surgeon: Harvel Quale, MD;  [facility];  Service: Gastroenterology;;    SOCIAL HISTORY: Social History   Socioeconomic History   Marital status: Widowed    Spouse name: Not on file   Number of children: 2   Years of education: Not on file   Highest education level: Not on file  Occupational History   Occupation: retired  Tobacco Use   Smoking status: Never   Smokeless tobacco: Never  Vaping Use  Vaping Use: Never used  Substance and Sexual Activity   Alcohol use: No   Drug use: No   Sexual activity: Never  Other Topics Concern   Not on file  Social History Narrative   Ms. Furney is widowed. Her young grandson lives with her, for whom she shares custody with her daughter, the son's aunt.     Right handed    Social Determinants of Health   Financial Resource Strain: Low Risk    Difficulty of Paying Living Expenses: Not hard at all  Food Insecurity: No Food Insecurity   Worried About Charity fundraiser in the Last Year: Never true   Ran Out of Food in the Last Year: Never true   Transportation Needs: No Transportation Needs   Lack of Transportation (Medical): No   Lack of Transportation (Non-Medical): No  Physical Activity: Insufficiently Active   Days of Exercise per Week: 5 days   Minutes of Exercise per Session: 10 min  Stress: No Stress Concern Present   Feeling of Stress : Only a little  Social Connections: Socially Isolated   Frequency of Communication with Friends and Family: More than three times a week   Frequency of Social Gatherings with Friends and Family: More than three times a week   Attends Religious Services: Never   Marine scientist or Organizations: No   Attends Archivist Meetings: Never   Marital Status: Widowed  Human resources officer Violence: Not At Risk   Fear of Current or Ex-Partner: No   Emotionally Abused: No   Physically Abused: No   Sexually Abused: No    FAMILY HISTORY: Family History  Problem Relation Age of Onset   Heart disease Mother    Osteoarthritis Mother    Sudden death Father    Single kidney Father    Other Father        h/o severe MVA injuries   Hyperlipidemia Sister    Other Daughter        Myalgias   Fibromyalgia Daughter    Allergies Daughter    Heart disease Maternal Grandfather    Sudden death Paternal Grandmother    Diabetes Paternal Grandfather    Heart disease Daughter    Other Daughter        palpitations   Pulmonary fibrosis Maternal Aunt    Cancer Paternal Uncle    Pulmonary fibrosis Maternal Aunt    Colon cancer Neg Hx     ALLERGIES:  is allergic to codeine phosphate.  MEDICATIONS:  Current Outpatient Medications  Medication Sig Dispense Refill   acetaminophen (TYLENOL) 325 MG tablet Take 325 mg by mouth every 6 (six) hours as needed for moderate pain.      albuterol (VENTOLIN HFA) 108 (90 Base) MCG/ACT inhaler Inhale 2 puffs into the lungs every 6 (six) hours as needed for wheezing. 8.5 g 2   amLODipine (NORVASC) 10 MG tablet Take 1 tablet (10 mg total) by mouth daily.  90 tablet 1   Ascorbic Acid (VITAMIN C) 100 MG tablet Take 100 mg by mouth daily.     cetirizine (ZYRTEC) 10 MG tablet Take 10 mg by mouth at bedtime. As needed     Cholecalciferol (VITAMIN D3) 25 MCG (1000 UT) CAPS Take 1,000 Units by mouth daily.      donepezil (ARICEPT) 10 MG tablet Take 1 tablet daily 30 tablet 11   levothyroxine (SYNTHROID) 75 MCG tablet Take 1 tablet (75 mcg total) by mouth daily. 30 tablet 3  lidocaine-prilocaine (EMLA) cream Apply 1 application topically as needed. (Patient taking differently: Apply 1 application topically as needed (port access).) 30 g 1   mirtazapine (REMERON) 7.5 MG tablet Take 1 tablet (7.5 mg total) by mouth at bedtime. 30 tablet 3   pantoprazole (PROTONIX) 40 MG tablet Take 1 tablet (40 mg total) by mouth daily. 90 tablet 3   potassium chloride SA (KLOR-CON) 20 MEQ tablet Take 1 tablet (20 mEq total) by mouth daily. 21 tablet 0   rOPINIRole (REQUIP) 0.25 MG tablet Take 1 tablet (0.25 mg total) by mouth at bedtime. 90 tablet 1   rosuvastatin (CRESTOR) 20 MG tablet Take 1 tablet (20 mg total) by mouth daily. 90 tablet 3   sucralfate (CARAFATE) 1 g tablet Take 1 g by mouth 4 (four) times daily -  with meals and at bedtime. As needed     vitamin B-12 (CYANOCOBALAMIN) 1000 MCG tablet Take 1,000 mcg by mouth daily.      No current facility-administered medications for this visit.    REVIEW OF SYSTEMS:   Constitutional: ( - ) fevers, ( - )  chills , ( - ) night sweats Eyes: ( - ) blurriness of vision, ( - ) double vision, ( - ) watery eyes Ears, nose, mouth, throat, and face: ( - ) mucositis, ( - ) sore throat Respiratory: ( - ) cough, ( - ) dyspnea, ( - ) wheezes Cardiovascular: ( - ) palpitation, ( - ) chest discomfort, ( - ) lower extremity swelling Gastrointestinal:  ( - ) nausea, ( - ) heartburn, ( - ) change in bowel habits Skin: ( - ) abnormal skin rashes Lymphatics: ( - ) new lymphadenopathy, ( - ) easy bruising Neurological: ( - )  numbness, ( - ) tingling, ( - ) new weaknesses Behavioral/Psych: ( - ) mood change, ( - ) new changes  All other systems were reviewed with the patient and are negative.  PHYSICAL EXAMINATION: ECOG PERFORMANCE STATUS: 1 - Symptomatic but completely ambulatory  Vitals:   08/05/21 1020  BP: (!) 179/71  Pulse: 69  Resp: 17  Temp: 97.6 F (36.4 C)  SpO2: 100%    Filed Weights   08/05/21 1020  Weight: 126 lb 9.6 oz (57.4 kg)   GENERAL: well appearing elderly Caucasian female in NAD  SKIN: skin color, texture, turgor are normal. EYES: conjunctiva are pink and non-injected, sclera clear NECK: supple, non-tender.  LYMPH: no cervical, supraclavicular, or axillary lymph nodes palpated.  LUNGS: clear to auscultation and percussion with normal breathing effort HEART: regular rate & rhythm and no murmurs and no lower extremity edema Musculoskeletal: no cyanosis of digits and no clubbing  PSYCH: alert & oriented x 3, fluent speech NEURO: no focal motor/sensory deficits  LABORATORY DATA:  I have reviewed the data as listed CBC Latest Ref Rng & Units 08/05/2021 07/22/2021 06/05/2021  WBC 4.0 - 10.5 K/uL 6.4 8.7 9.4  Hemoglobin 12.0 - 15.0 g/dL 12.0 12.9 13.6  Hematocrit 36.0 - 46.0 % 33.5(L) 37.9 40.0  Platelets 150 - 400 K/uL 160 158 179    CMP Latest Ref Rng & Units 08/05/2021 07/22/2021 06/05/2021  Glucose 70 - 99 mg/dL 98 94 148(H)  BUN 8 - 23 mg/dL 31(H) 38(H) 11  Creatinine 0.44 - 1.00 mg/dL 1.24(H) 0.98 0.98  Sodium 135 - 145 mmol/L 139 142 138  Potassium 3.5 - 5.1 mmol/L 3.7 4.0 2.9(L)  Chloride 98 - 111 mmol/L 106 102 104  CO2 22 - 32 mmol/L  23 26 24   Calcium 8.9 - 10.3 mg/dL 9.1 9.6 9.3  Total Protein 6.5 - 8.1 g/dL 6.7 6.3 7.3  Total Bilirubin 0.3 - 1.2 mg/dL 0.3 0.3 0.8  Alkaline Phos 38 - 126 U/L 77 88 83  AST 15 - 41 U/L 18 14 20   ALT 0 - 44 U/L 16 11 12     RADIOGRAPHIC STUDIES: No results found.  ASSESSMENT & PLAN Elizabeth Mcintyre 80 y.o. female with medical  history significant for low grade marginal zone lymphoma stage IIIA who presents for a follow up visit.  Previously I recommend a rituximab monotherapy given her intolerance of BR x 2 cycles. This regimen consisted of rituximab 375 mg/m2 IV q weekly x 4 weeks. During her scans on 8/16 - 05/03/2020 she was shown to have marked response to therapy with normalization of spleen size and resolution of lymphadenopathy. Repeat PET CT scan on 09/12/20 after completed of rituximab monotherapy showed a completed response. We will proceed with q 2 months maintenance/consolidation rituximab x 4 cycles.   GELF Criteria: 1 (splenomegaly). Indication for treatment  # Low Grade Marginal Zone Lymphoma, Stage III -- discontinued bendamustine + rituximab as patient is unable to tolerate this treatment. Will pursue rituximab monotherapy instead. Started therapy with Cycle 1 Day 1 on 03/28/2020 BR, decreased to Ritux alone q weekly x 4 starting 07/06/2020. --patient completed excisional lymph node biopsy to confirm the diagnosis. Pathological exam of the lymph node confirms marginal zone lymphoma.  --PET CT scan confirms stage III disease. Patient meets GELF criteria for treatment (splenomegaly). She did not have any B symptoms and her counts were stable at diagnosis, with some mild thrombocytopenia.  --Patient completed monotherapy rituximab x 4 weeks on 08/06/2020.  --post treatment PET CT scan from 09/12/20 showed Deauville Score 1, complete response to therapy. Recommend to proceed with q 8 week rituximab 375mg /m2 maintenance therapy. This was continued for 4 doses --Patient started C1D1 of maintenance Rituximab on 11/16/2020.  --Patient returns today for C4D1 of maintenance Rituximab. Labs from today were reviewed without any intervention needed.  --Return to the clinic in 12 weeks for continued monitoring.  #Leukocytosis, resolved --unclear etiology, patient has no focal infectious symptoms --continue to  monitor  #Concern for GI Bleed, resolved #Hematemesis, resolved #Colitis, resolved --patient admitted on 03/30/2020 (day after Cycle 1 chemotherapy) with hematemesis --no overt GI bleeding noted on Upper GI evaluation -- no signs of bleeding since last visit.  --iron levels appeared low previously and patient was having symptoms of iron deficiency anemia including fatigue, restless leg, and dyspnea on exertion. -- Repeat labs today, Hgb 12.8 --continue to monitor.   #Symptom Management -- zofran 8mg  PO q8H PRN and compazine 10mg  PO q6H PRN --  EMLA cream for patient's port site --can have port removed if next PET CT scan is clear.  --d/c allopurinol --for abdominal pain/headaches, can take tylenol 650mg -1000mg  PO q8H PRN.   Orders Placed This Encounter  Procedures   NM PET Image Restag (PS) Skull Base To Thigh    Standing Status:   Future    Standing Expiration Date:   08/05/2022    Order Specific Question:   If indicated for the ordered procedure, I authorize the administration of a radiopharmaceutical per Radiology protocol    Answer:   Yes    Order Specific Question:   Preferred imaging location?    Answer:   [facility]     All questions were answered. The patient knows to call the  clinic with any problems, questions or concerns.  A total of more than 30 minutes were spent on this encounter and over half of that time was spent on counseling and coordination of care as outlined above.   Ledell Peoples, MD Department of Hematology/Oncology [place] [facility] Phone: [phone] Pager: [phone] Email: [email or web address]  08/06/2021 8:37 AM   Literature Support:  NCCN Guidelines: Consolidation with rituximab should be offered if initially treated with single agent rituximab. (MS-63)  UptoDate: Different schedules have been used. The following administration schedules were used in the randomized trials and are equally acceptable  approaches:  ?Rituximab 375 mg/m2 intravenously per week for a total of four doses  ?Rituximab 375 mg/m2 intravenously per week for four weeks, followed by four additional doses administered every two months

## 2021-08-06 ENCOUNTER — Encounter: Payer: Self-pay | Admitting: Hematology and Oncology

## 2021-08-12 ENCOUNTER — Other Ambulatory Visit: Payer: Self-pay

## 2021-08-12 ENCOUNTER — Ambulatory Visit (HOSPITAL_COMMUNITY)
Admission: RE | Admit: 2021-08-12 | Discharge: 2021-08-12 | Disposition: A | Discharge status: Home / Self Care | Payer: Medicare Other | Source: Ambulatory Visit | Attending: Hematology and Oncology | Admitting: Hematology and Oncology

## 2021-08-12 DIAGNOSIS — C8580 Other specified types of non-Hodgkin lymphoma, unspecified site: Secondary | ICD-10-CM | POA: Diagnosis not present

## 2021-08-12 DIAGNOSIS — K7689 Other specified diseases of liver: Secondary | ICD-10-CM | POA: Diagnosis not present

## 2021-08-12 DIAGNOSIS — C884 Extranodal marginal zone B-cell lymphoma of mucosa-associated lymphoid tissue [MALT-lymphoma]: Secondary | ICD-10-CM | POA: Diagnosis not present

## 2021-08-12 DIAGNOSIS — I2510 Atherosclerotic heart disease of native coronary artery without angina pectoris: Secondary | ICD-10-CM | POA: Diagnosis not present

## 2021-08-12 DIAGNOSIS — N281 Cyst of kidney, acquired: Secondary | ICD-10-CM | POA: Diagnosis not present

## 2021-08-12 LAB — GLUCOSE, CAPILLARY: Glucose-Capillary: 112 mg/dL — ABNORMAL HIGH (ref 70–99)

## 2021-08-12 IMAGING — CT NM PET TUM IMG RESTAG (PS) SKULL BASE T - THIGH
7 series · 25 of 25 positions shown · non-contrast
Comparison: [DATE]

CLINICAL DATA: Subsequent treatment strategy for marginal zone
lymphoma.

EXAM:
NUCLEAR MEDICINE PET SKULL BASE TO THIGH
TECHNIQUE: 6.9 mCi F-18 FDG was injected intravenously. Full-ring PET imaging
was performed from the skull base to thigh after the radiotracer. CT
data was obtained and used for attenuation correction and anatomic
localization.
Fasting blood glucose: 112 mg/dl

[Series 3: pet sk_thigh ac · axial · 5.0mm · 4.07mm/px · z∈[-1463,-575]mm · 6 of 223 slices shown]
[im 1/223]
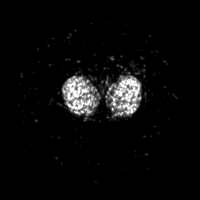
[im 45/223]
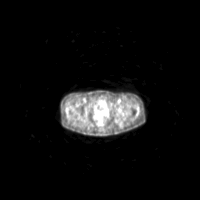
[im 89/223]
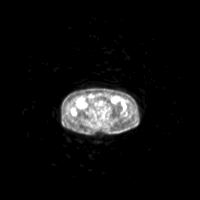
[im 134/223]
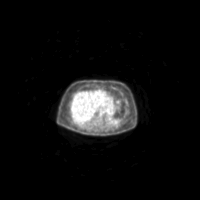
[im 178/223]
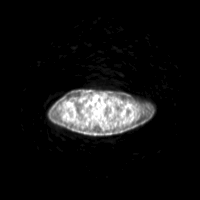
[im 223/223]
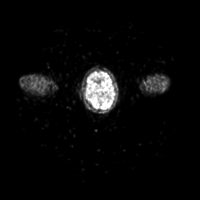

[Series 4: ct sk_thigh 5.0 bf37 · axial · 5.0mm · 0.98mm/px · z∈[-1463,-575]mm · 5 of 223 slices shown]
[im 1/223]
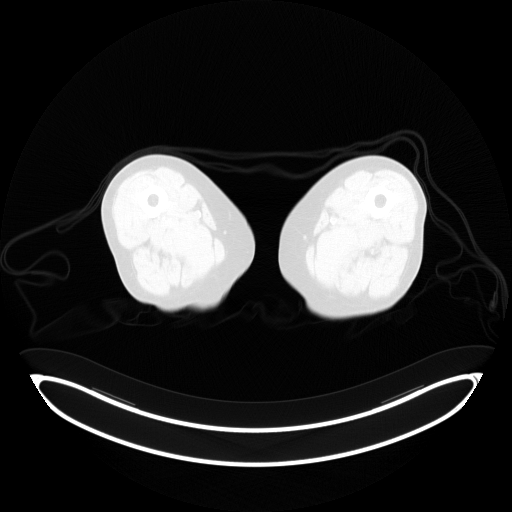
[im 56/223]
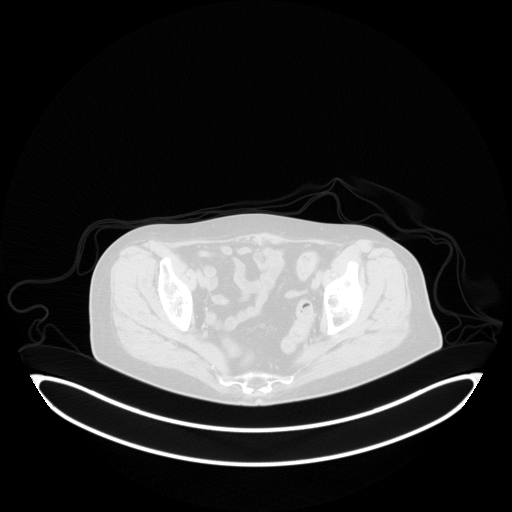
[im 112/223]
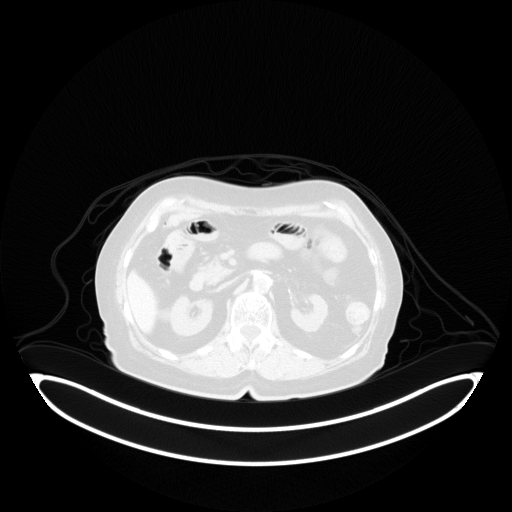
[im 167/223]
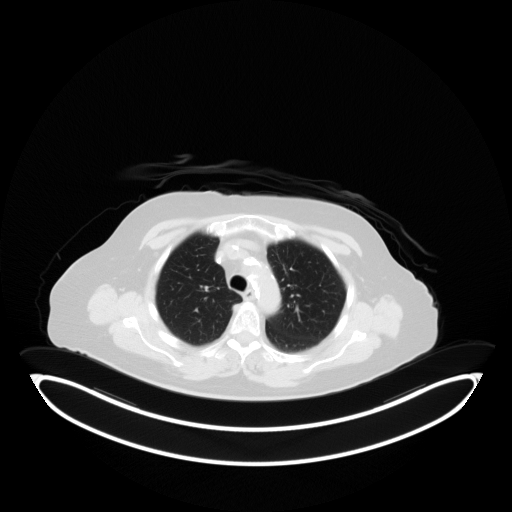
[im 223/223  brain]
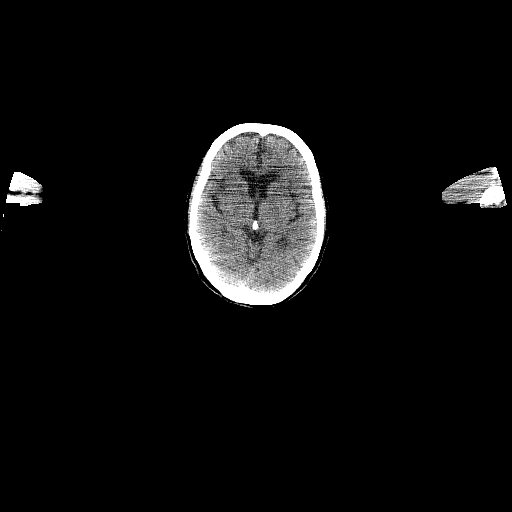

[Series 5: pet sk_thigh nac · axial · 5.0mm · 4.07mm/px · z∈[-1463,-575]mm · 5 of 223 slices shown]
[im 1/223]
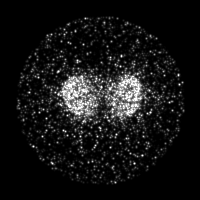
[im 56/223]
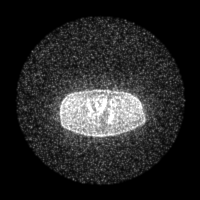
[im 112/223]
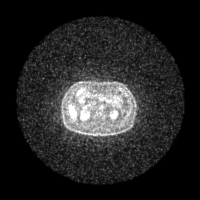
[im 167/223]
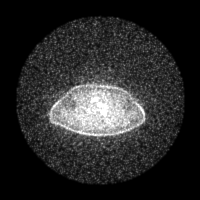
[im 223/223]
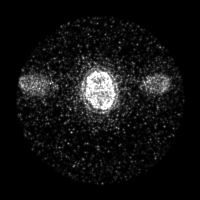

[Series 8: ct sk_thigh 5.0 br59 lung_bone · axial · 5.0mm · 0.66mm/px · z∈[-989,-713]mm · 2 of 70 slices shown]
[im 1/70]
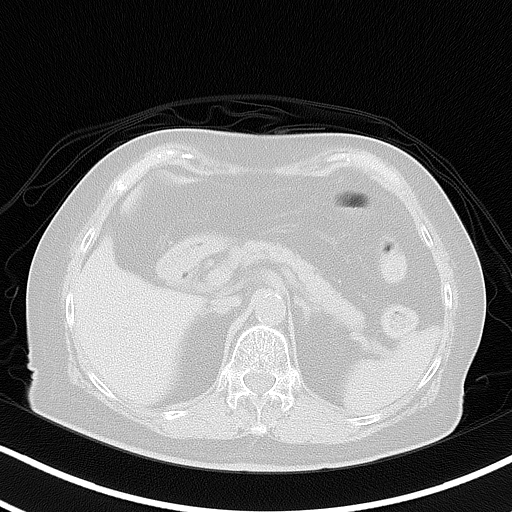
[im 70/70]
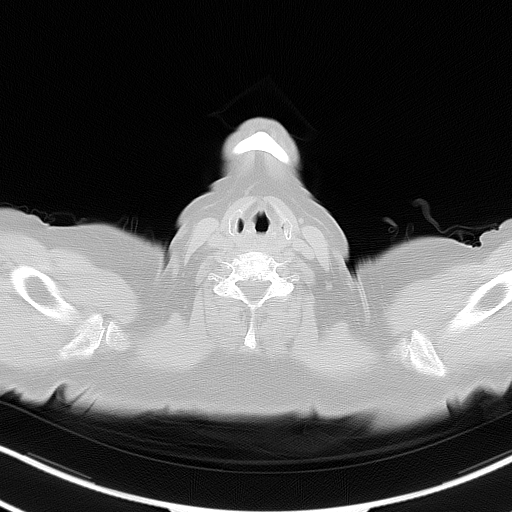

[Series 603: <mip collection> · coronal · 1.84mm/px · 1 of 32 slices shown]
[im 1/32]
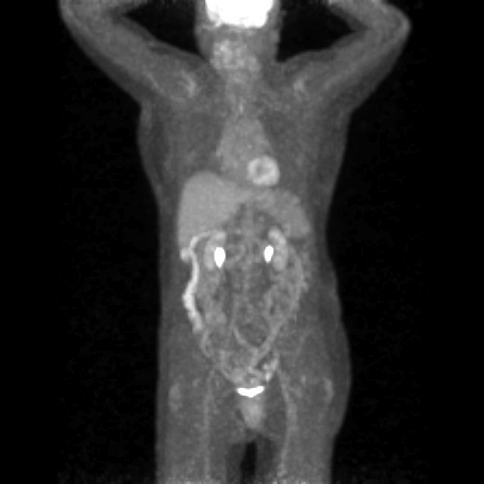

[Series 604: fused cor · 1 of 40 slices shown]
[im 1/40]
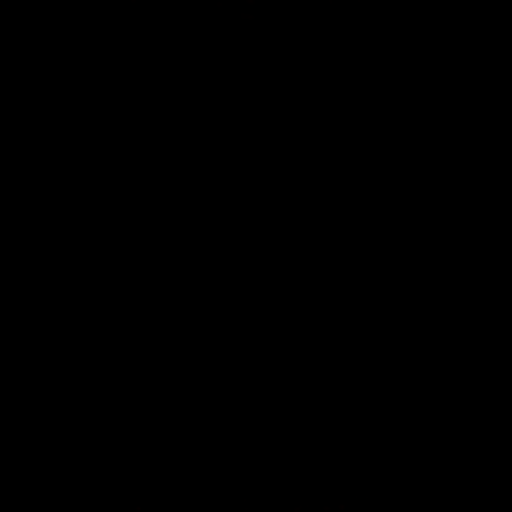

[Series 605: range-ct sk_thigh 5.0 bf37-tra-<alpha range> · 5 of 217 slices shown]
[im 1/217]
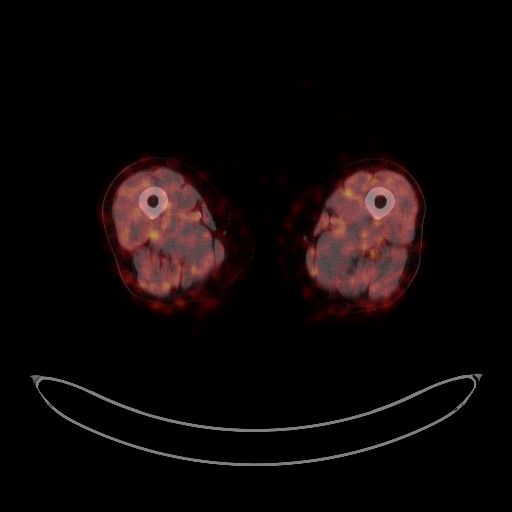
[im 55/217]
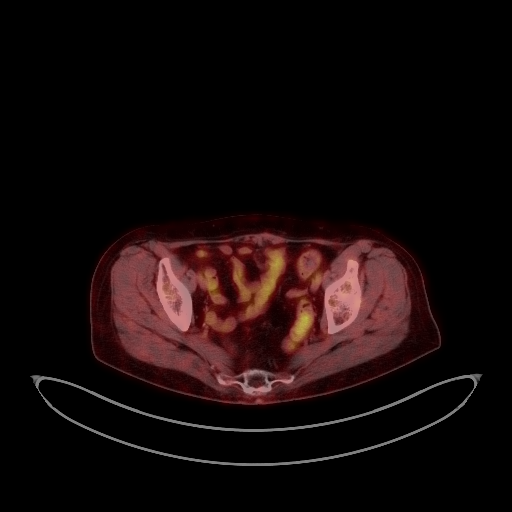
[im 109/217]
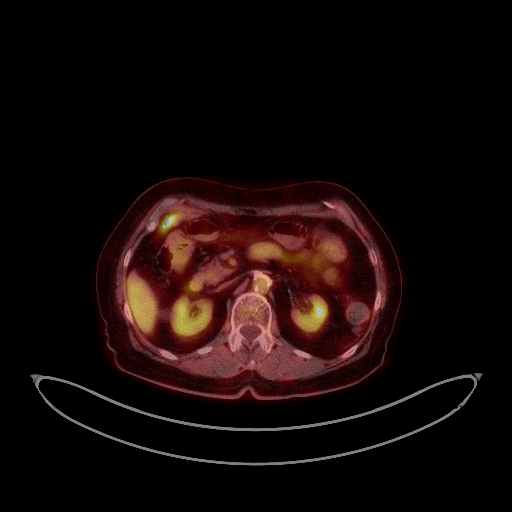
[im 163/217]
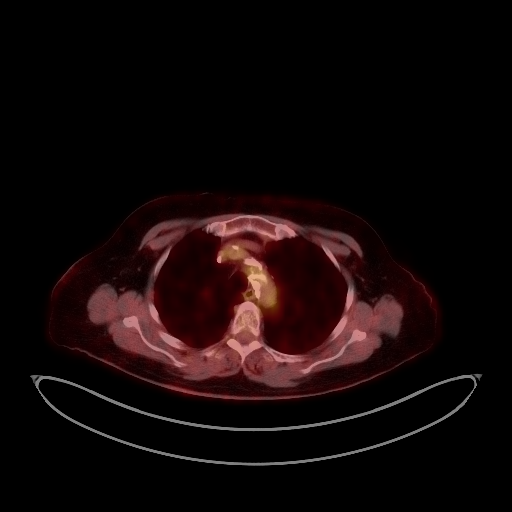
[im 217/217]
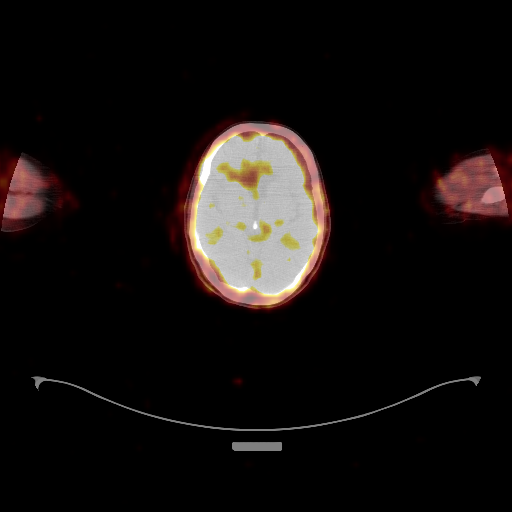

[25 of 25 positions shown; findings below may reference images not displayed]

FINDINGS: Mediastinal blood pool activity: SUV max

Liver activity: SUV max

NECK: No hypermetabolic lymph nodes in the neck.

Incidental CT findings: none

CHEST: No hypermetabolic mediastinal or hilar nodes. No suspicious
pulmonary nodules on the CT scan.

Incidental CT findings: Aortic atherosclerosis and coronary artery
calcifications.

ABDOMEN/PELVIS: No abnormal hypermetabolic activity within the
liver, pancreas, adrenal glands, or spleen. No hypermetabolic lymph
nodes in the abdomen or pelvis.

Incidental CT findings: Aortic atherosclerosis. Status post
cholecystectomy. Exophytic cyst arises off the lateral cortex of the
interpolar right kidney measuring 1.1 cm.

SKELETON: No focal hypermetabolic activity to suggest skeletal
metastasis.

Incidental CT findings: none
IMPRESSION: 1. No signs of residual or recurrent FDG avid tumor.
2.  Aortic Atherosclerosis ([N6]-[N6]).
3. Coronary artery calcifications.

## 2021-08-12 MED ORDER — FLUDEOXYGLUCOSE F - 18 (FDG) INJECTION
6.3000 | Freq: Once | INTRAVENOUS | Status: AC | PRN
  Administered 2021-08-12: 16:00:00 6.89 via INTRAVENOUS

## 2021-08-14 ENCOUNTER — Other Ambulatory Visit: Payer: Self-pay

## 2021-08-14 ENCOUNTER — Encounter: Payer: Self-pay | Admitting: Physician Assistant

## 2021-08-14 ENCOUNTER — Ambulatory Visit (INDEPENDENT_AMBULATORY_CARE_PROVIDER_SITE_OTHER): Payer: Medicare Other | Admitting: Physician Assistant

## 2021-08-14 ENCOUNTER — Telehealth: Payer: Self-pay | Admitting: *Deleted

## 2021-08-14 VITALS — BP 146/87 | HR 76 | Resp 18 | Ht 61.0 in | Wt 126.0 lb

## 2021-08-14 DIAGNOSIS — F0280 Dementia in other diseases classified elsewhere without behavioral disturbance: Secondary | ICD-10-CM | POA: Diagnosis not present

## 2021-08-14 DIAGNOSIS — G3109 Other frontotemporal dementia: Secondary | ICD-10-CM | POA: Diagnosis not present

## 2021-08-14 DIAGNOSIS — F02A0 Dementia in other diseases classified elsewhere, mild, without behavioral disturbance, psychotic disturbance, mood disturbance, and anxiety: Secondary | ICD-10-CM | POA: Diagnosis not present

## 2021-08-14 DIAGNOSIS — G301 Alzheimer's disease with late onset: Secondary | ICD-10-CM | POA: Diagnosis not present

## 2021-08-14 NOTE — Telephone Encounter (Signed)
-----   Message from Orson Slick, MD sent at 08/14/2021  9:10 AM EST ----- Please let Elizabeth Mcintyre know that her PET scan is completely cancer free. Her results are consistent with a complete remission.  We will plan to see her back in Feb 2023.   ----- Message ----- From: Interface, Rad Results In Sent: 08/13/2021   2:17 PM EST To: Orson Slick, MD

## 2021-08-14 NOTE — Patient Instructions (Signed)
   Follow-up in 6 months  Continue donepezil 10 mg daily.   FALL PRECAUTIONS: Be cautious when walking. Scan the area for obstacles that may increase the risk of trips and falls. When getting up in the mornings, sit up at the edge of the bed for a few minutes before getting out of bed. Consider elevating the bed at the head end to avoid drop of blood pressure when getting up. Walk always in a well-lit room (use night lights in the walls). Avoid area rugs or power cords from appliances in the middle of the walkways. Use a walker or a cane if necessary and consider physical therapy for balance exercise. Get your eyesight checked regularly.  FINANCIAL OVERSIGHT: Supervision, especially oversight when making financial decisions or transactions is also recommended.  HOME SAFETY: Consider the safety of the kitchen when operating appliances like stoves, microwave oven, and blender. Consider having supervision and share cooking responsibilities until no longer able to participate in those. Accidents with firearms and other hazards in the house should be identified and addressed as well.  DRIVING: Regarding driving, in patients with progressive memory problems, driving will be impaired. We advise to have someone else do the driving if trouble finding directions or if minor accidents are reported. Independent driving assessment is available to determine safety of driving.  ABILITY TO BE LEFT ALONE: If patient is unable to contact 911 operator, consider using LifeLine, or when the need is there, arrange for someone to stay with patients. Smoking is a fire hazard, consider supervision or cessation. Risk of wandering should be assessed by caregiver and if detected at any point, supervision and safe proof recommendations should be instituted.  MEDICATION SUPERVISION: Inability to self-administer medication needs to be constantly addressed. Implement a mechanism to ensure safe administration of the  medications.  RECOMMENDATIONS FOR ALL PATIENTS WITH MEMORY PROBLEMS: 1. Continue to exercise (Recommend 30 minutes of walking everyday, or 3 hours every week) 2. Increase social interactions - continue going to [place] and enjoy social gatherings with friends and family 3. Eat healthy, avoid fried foods and eat more fruits and vegetables 4. Maintain adequate blood pressure, blood sugar, and blood cholesterol level. Reducing the risk of stroke and cardiovascular disease also helps promoting better memory. 5. Avoid stressful situations. Live a simple life and avoid aggravations. Organize your time and prepare for the next day in anticipation. 6. Sleep well, avoid any interruptions of sleep and avoid any distractions in the bedroom that may interfere with adequate sleep quality 7. Avoid sugar, avoid sweets as there is a strong link between excessive sugar intake, diabetes, and cognitive impairment We discussed the Mediterranean diet, which has been shown to help patients reduce the risk of progressive memory disorders and reduces cardiovascular risk. This includes eating fish, eat fruits and green leafy vegetables, nuts like almonds and hazelnuts, walnuts, and also use olive oil. Avoid fast foods and fried foods as much as possible. Avoid sweets and sugar as sugar use has been linked to worsening of memory function.  There is always a concern of gradual progression of memory problems. If this is the case, then we may need to adjust level of care according to patient needs. Support, both to the patient and caregiver, should then be put into place.

## 2021-08-14 NOTE — Progress Notes (Signed)
Assessment/Plan:    MIld Dementia likely due to Alzheimer's Disease, FTD    Recommendations:  Discussed safety both in and out of the home.  Discussed the importance of regular daily schedule with inclusion of crossword puzzles to maintain brain function.  Continue to monitor mood by PCP Stay active at least 30 minutes at least 3 times a week.  Naps should be scheduled and should be no longer than 60 minutes and should not occur after 2 PM.  Mediterranean diet is recommended  Continue donepezil 10 mg daily Side effects were discussed Follow up in  6 months.   Case discussed with Dr. Tomi Mcintyre who agrees with the plan   Subjective:      Elizabeth Mcintyre is a 80 y.o.  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 She was first seen on 01/17/21 at which time she had a MoCA of 14/30, suspicious for mild dementia. MRI without contrast showed asymmetric atrophy of the anterior right temporal lobe. Neurocognitive testing on 03/27/2021 indicated mild dementia likely due to Alzheimer's disease, right temporal variant FTD.  She was started on donepezil 10 mg daily tolerating well. Back in May 2022 she also had new symptoms of headaches and paresthesias over the left frontal region, with some blurred vision in the left eye. ESR neg for inflammatory issues.,  Gabapentin initiated, which seemed to help.  Seen today in follow up for memory loss. This patient is accompanied in the office by her daughter who supplements the history.  Previous records as well as any outside records available were reviewed prior to todays visit.   The patient reports that her memory is "great "she keeps herself busy.  Her daughter reports that she has "a lot of dj vu's, such as when the daughter is driving the highway, and passes a truck, she will say we just passed that truck  before, and she will repeat that sentence several times ".  She continues to ask the same questions.  Also, she does not remember events that happened the day before, for example "if I bring your sweet tea, did not. She will not remember that I did, so she did not drink it ".  Apparently, there is a deficiency in the concept of time.  There were moments in which she went to bed at 3 PM, thinking is denied.  Or she will think that some Ativan to place a month ago, and it was a year prior.  She continues to be interactive with the family, playing with the grandchildren, and enjoys doing word finding still.  She also likes to walk with her daughter.  She misplaces objects frequently, but does not place any in unusual locations.  "She just does not know where she put them "-daughter says.  She denies depression, irritability, hallucinations or paranoia.  She sleeps well, without vivid dreams or sleepwalking.  She still independent of bathing and dressing.  Her daughter is in charge of the medications and finances.  Her appetite is good, denies trouble swallowing.  She cooks with her daughter.  She ambulates without difficulty, she had a fall 1 month ago, due to presyncope, felt to be due to dehydration.  She denies head injury.  She continues to drive short distances without becoming lost.  She denies any headaches, vision changes, focal numbness or tingling, unilateral weakness, tremors, or anosmia or seizures.  Denies urine incontinence, retention, constipation or diarrhea.     Neuropsychological testing on 03/27/2021:  demonstrating "cognitive impairment amounting to a dementia level problem. While she does have memory impairment and naming problems, her memory is relatively better preserved than typical for AD at this point in the illness. She has asymmetric atrophy of the anterior right temporal lobe on structural MRI, a radiologic presentation associated with the so-called "right temporal variant FTD."   Considering base rates and the patients demographics, Alzheimer's is certainly high on the differential and she does meet criteria for AD, although I am also worried about other proteinopathies (e.g., TDP-43 and Tau) more often associated with this clinico-radiologic entity. I think it is clear that this is neurodegenerative, and thus that there will be progression across time. If diagnostic clarity is a goal of care, LP for CSF biomarkers could be considered, although this wouldn't necessarily change her prognosis. A trial of antidementia medication would also be appropriate. I have no major concerns about the patient's driving at this point in time but would recommend that make use of GPS or other safeguards and/or limit her driving to local trips given her difficulties with navigation, which are particularly common in rtvFTD. 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 She started noticing changes a year ago, stating "I don't have a memory." Her daughter Elizabeth Mcintyre started noticing changes around 2 years ago when she would get lost driving to [facility] house. She was repeating herself more. She had been diagnosed with lymphoma and they initially attributed this to "chemo fog." She is scheduled for 2 more sessions of immunotherapy then she will be done for now. Symptoms have been the same over the past year. She drives on occasion still and would get lost but find her way back or call her daughter. She would not recall how to get to her house of 30 years. She was missing bills/taxes 2 years ago, Elizabeth Mcintyre started helping her last year. When she tried to organize her bills and write them down, she would get confused and rearrange her system, then forget the system she made.  Elizabeth Mcintyre has been helping with her pillbox but would come and see pills there a week later. She denies leaving the stove on. She misplaces things constantly. She has been living with a housemate for the past 12 years who stays on the other side of the house, she is usually by herself. She is independent with dressing and bathing.    She reports a sensation on the left frontal region going down her face for the past month. She says if feels funny, her neck feels funny, "not pain," but like something running down. Sensation occurs on a daily basis. Her vision is more blurred on the left eye. She notices the left eyelid stays closed especially when she wakes up, she has to pull up her lid. There is some nausea, no vomiting. She describes the sensation is "like a fog," with headache 7/10 going down to 1/10. She has been taking Tylenol every night for the past month, it does not help much. She states that she was previously sleeping well, but for the past month, "I don't sleep." She did not sleep last night, she does not take naps. She denies any  dizziness, dysarthria, back pain, focal numbness/tingling/weakness, bowel/bladder dysfunction, anosmia, tremors. She has shoulder pain. No family history of dementia, no history of significant head injuries or alcohol use. She gets depressed once in a while, her brain is trying to figure out what she wants to do next. She has 2 homes and constantly moves things around. Elizabeth Mcintyre denies any paranoia or hallucinations, she knows the patient is overwhelmed because there are things to put in order.    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     PREVIOUS MEDICATIONS:   CURRENT MEDICATIONS:  Outpatient Encounter Medications as of 08/14/2021  Medication Sig   acetaminophen (TYLENOL) 325 MG tablet Take  325 mg by mouth every 6 (six) hours as needed for moderate pain.    albuterol (VENTOLIN HFA) 108 (90 Base) MCG/ACT inhaler Inhale 2 puffs into the lungs every 6 (six) hours as needed for wheezing.   amLODipine (NORVASC) 10 MG tablet Take 1 tablet (10 mg total) by mouth daily.   Ascorbic Acid (VITAMIN C) 100 MG tablet Take 100 mg by mouth daily.   cetirizine (ZYRTEC) 10 MG tablet Take 10 mg by mouth at bedtime. As needed   Cholecalciferol (VITAMIN D3) 25 MCG (1000 UT) CAPS Take 1,000 Units by mouth daily.    donepezil (ARICEPT) 10 MG tablet Take 1 tablet daily   levothyroxine (SYNTHROID) 75 MCG tablet Take 1 tablet (75 mcg total) by mouth daily.   lidocaine-prilocaine (EMLA) cream Apply 1 application topically as needed. (Patient taking differently: Apply 1 application topically as needed (port access).)   mirtazapine (REMERON) 7.5 MG tablet Take 1 tablet (7.5 mg total) by mouth at bedtime.   pantoprazole (PROTONIX) 40 MG tablet Take 1 tablet (40 mg total) by mouth daily.   potassium chloride SA (KLOR-CON) 20 MEQ tablet Take 1 tablet (20 mEq total) by mouth daily.   rOPINIRole (REQUIP) 0.25 MG tablet Take 1 tablet (0.25 mg total) by mouth at bedtime.   rosuvastatin (CRESTOR) 20 MG tablet Take 1 tablet (20 mg total) by mouth daily.   sucralfate (CARAFATE) 1 g tablet Take 1 g by mouth 4 (four) times daily -  with meals and at bedtime. As needed   vitamin B-12 (CYANOCOBALAMIN) 1000 MCG tablet Take 1,000 mcg by mouth daily.    No facility-administered encounter medications on file as of 08/14/2021.     Objective:     PHYSICAL EXAMINATION:    VITALS:   Vitals:   08/14/21 1511  BP: (!) 146/87  Pulse: 76  Resp: 18  SpO2: 98%  Weight: 126 lb (57.2 kg)  Height: 5' 1"  (1.549 m)    GEN:  The patient appears stated age and is in NAD. HEENT:  Normocephalic, atraumatic.   Neurological examination:  General: NAD, well-groomed, appears stated age. Orientation: The patient is alert. Oriented  to person, place and not to date Cranial nerves: There is good facial symmetry.The speech is fluent and clear. No aphasia or dysarthria. Fund of knowledge is appropriate. Recent and remote memory are impaired. Attention and concentration are normal. Difficulty with naming and repetition  Hearing is intact to conversational tone.    Sensation: Sensation is intact to light touch throughout Motor: Strength is at least antigravity x4. Tremors: none  DTR's 2/4 in [place] Cognitive Assessment  01/17/2021  Visuospatial/ Executive (0/5) 1  Naming (0/3) 0  Attention: Read list of digits (0/2) 2  Attention: Read list of letters (0/1) 0  Attention: Serial 7 subtraction starting at 100 (0/3) 2  Language: Repeat phrase (0/2) 1  Language : Fluency (0/1) 0  Abstraction (0/2) 0  Delayed Recall (0/5) 1  Orientation (0/6) 6  Total 13  Adjusted Score (based on education) 14   MMSE - Mini Mental State Exam 03/27/2021 09/29/2018 03/13/2017  Orientation to time 5 5 5   Orientation to Place 5 5 5   Registration 3 3 3   Attention/ Calculation 3 3 5   Recall 2 2 3   Language- name 2 objects 2 2 2   Language- repeat 1 1 1   Language- follow 3 step command 1 1 3   Language- read & follow direction 1 1 1   Write a sentence 1 1 1   Copy design 1 1 1   Total score 25 25 30     No flowsheet data found.     Movement examination: Tone: There is normal tone in the UE/LE Abnormal movements:  no tremor.  No myoclonus.  No asterixis.   Coordination:  There is no decremation with RAM's. Normal finger to nose  Gait and Station: The patient has no difficulty arising out of a deep-seated chair without the use of the hands. The patient's stride length is good.  Gait is cautious and narrow.        Total time spent on today's visit was 35 minutes, including both face-to-face time and nonface-to-face time. Time included that spent on review of records (prior notes available to me/labs/imaging if pertinent), discussing  treatment and goals, answering patient's questions and coordinating care.  Cc:  Lindell Spar, MD Sharene Butters, PA-C

## 2021-08-14 NOTE — Telephone Encounter (Signed)
TCT patient regarding recent scan results.Damaris Schooner with pt's daughter, [name].  Advised that her mom's scans came back very good. No evidence of cancer. She is in complete remission. [name] very pleased with results and will let her mom know.  No questions or concerns

## 2021-09-12 ENCOUNTER — Telehealth: Payer: Self-pay | Admitting: *Deleted

## 2021-09-12 ENCOUNTER — Other Ambulatory Visit: Payer: Self-pay | Admitting: Hematology and Oncology

## 2021-09-12 DIAGNOSIS — C8580 Other specified types of non-Hodgkin lymphoma, unspecified site: Secondary | ICD-10-CM

## 2021-09-12 NOTE — Telephone Encounter (Signed)
Received vm message from pt regarding port removal. OK per Dr. Lorenso Courier for port removal. Order placed for this.  TCT  pt's daughter. Left vm message advising that Dr. Lorenso Courier ok'd port removal. Order placed. Pt/daughter to be notified for day and time of removal per central radiology scheduling

## 2021-09-20 ENCOUNTER — Encounter: Payer: Self-pay | Admitting: Hematology and Oncology

## 2021-09-30 ENCOUNTER — Other Ambulatory Visit (HOSPITAL_COMMUNITY): Payer: Self-pay | Admitting: Physician Assistant

## 2021-10-01 ENCOUNTER — Encounter (HOSPITAL_COMMUNITY): Payer: Self-pay

## 2021-10-01 ENCOUNTER — Ambulatory Visit (HOSPITAL_COMMUNITY)
Admission: RE | Admit: 2021-10-01 | Discharge: 2021-10-01 | Disposition: A | Discharge status: Home / Self Care | Payer: Medicare HMO | Source: Ambulatory Visit | Attending: Hematology & Oncology | Admitting: Hematology & Oncology

## 2021-10-01 ENCOUNTER — Ambulatory Visit (HOSPITAL_COMMUNITY)
Admission: RE | Admit: 2021-10-01 | Discharge: 2021-10-01 | Disposition: A | Discharge status: Home / Self Care | Payer: Medicare HMO | Source: Ambulatory Visit | Attending: Hematology and Oncology | Admitting: Hematology and Oncology

## 2021-10-01 ENCOUNTER — Other Ambulatory Visit: Payer: Self-pay

## 2021-10-01 DIAGNOSIS — Z452 Encounter for adjustment and management of vascular access device: Secondary | ICD-10-CM | POA: Diagnosis not present

## 2021-10-01 DIAGNOSIS — C8580 Other specified types of non-Hodgkin lymphoma, unspecified site: Secondary | ICD-10-CM | POA: Diagnosis not present

## 2021-10-01 HISTORY — PX: IR REMOVAL TUN ACCESS W/ PORT W/O FL MOD SED: IMG2290

## 2021-10-01 MED ORDER — LIDOCAINE-EPINEPHRINE 1 %-1:100000 IJ SOLN
INTRAMUSCULAR | Status: AC
  Filled 2021-10-01: qty 1

## 2021-10-01 MED ORDER — MIDAZOLAM HCL 2 MG/2ML IJ SOLN
INTRAMUSCULAR | Status: AC
  Filled 2021-10-01: qty 2

## 2021-10-01 MED ORDER — FENTANYL CITRATE (PF) 100 MCG/2ML IJ SOLN
INTRAMUSCULAR | Status: AC | PRN
  Administered 2021-10-01: 50 ug via INTRAVENOUS

## 2021-10-01 MED ORDER — MIDAZOLAM HCL 2 MG/2ML IJ SOLN
INTRAMUSCULAR | Status: AC | PRN
Start: 2021-10-01 — End: 2021-10-01
  Administered 2021-10-01: 1 mg via INTRAVENOUS

## 2021-10-01 MED ORDER — LIDOCAINE-EPINEPHRINE (PF) 1 %-1:200000 IJ SOLN
INTRAMUSCULAR | Status: AC | PRN
  Administered 2021-10-01: 20 mL

## 2021-10-01 MED ORDER — SODIUM CHLORIDE 0.9 % IV SOLN: INTRAVENOUS | Status: DC

## 2021-10-01 MED ORDER — FENTANYL CITRATE (PF) 100 MCG/2ML IJ SOLN
INTRAMUSCULAR | Status: AC
  Filled 2021-10-01: qty 2

## 2021-10-01 NOTE — Procedures (Signed)
Interventional Radiology Procedure Note  Procedure: Port removal  Findings: Please refer to procedural dictation for full description.  Complications: None immediate  Estimated Blood Loss: < 5 mL  Recommendations: Follow up with IR as needed.   Ruthann Cancer, MD

## 2021-10-01 NOTE — H&P (Signed)
Referring Physician(s): Dorsey,John T IV  Supervising Physician: Ruthann Cancer  Patient Status:  [facility] OP  Chief Complaint:  "Im getting my port out"  Subjective: Pt known to IR service from port a cath placement on 03/13/20.  She has a history of low-grade marginal zone lymphoma and is currently in remission.  She is no longer using her Port-A-Cath and presents today for Port-A-Cath removal.  She denies fever, dyspnea, cough, nausea, vomiting or bleeding.  She does have occasional headaches, some occasional chest tightness, abdominal discomfort and back spasms.  Additional history as below.  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 Authorizing Provider  amLODipine (NORVASC) 10 MG tablet Take 1 tablet (10 mg total) by mouth daily. 11/13/20  Yes Kuneff, Renee A, DO  Ascorbic Acid (VITAMIN C) 100 MG tablet Take 100 mg by mouth daily.   Yes [provider]  cetirizine (ZYRTEC) 10 MG tablet Take 10 mg by mouth at bedtime. As needed   Yes [provider]  Cholecalciferol (VITAMIN D3) 25 MCG (1000 UT) CAPS Take 1,000 Units by mouth daily.    Yes [provider]  donepezil (ARICEPT) 10 MG tablet Take 1 tablet daily 03/22/21  Yes Cameron Sprang, MD  levothyroxine (SYNTHROID) 75 MCG tablet Take 1 tablet (75 mcg total) by mouth daily. 02/13/21  Yes Lindell Spar, MD  mirtazapine (REMERON) 7.5 MG tablet Take 1 tablet (7.5 mg total) by mouth at bedtime. 07/22/21  Yes Lindell Spar, MD  pantoprazole (PROTONIX) 40 MG tablet Take 1 tablet (40 mg total) by mouth daily. 11/15/20 11/15/21 Yes Carver, Elon Alas, DO  potassium chloride SA (KLOR-CON) 20 MEQ tablet Take 1 tablet (20 mEq total) by mouth daily. 07/13/20  Yes Orson Slick, MD  rOPINIRole (REQUIP) 0.25 MG tablet Take 1 tablet (0.25 mg total) by mouth at bedtime. 11/13/20  Yes Kuneff, Renee A, DO  rosuvastatin (CRESTOR) 20 MG tablet Take 1 tablet (20 mg total) by mouth daily. 11/14/20  Yes Kuneff, Renee A, DO  sucralfate (CARAFATE) 1 g tablet Take 1 g by mouth 4 (four) times daily -  with meals and at bedtime. As needed   Yes [provider]  vitamin B-12 (CYANOCOBALAMIN) 1000 MCG tablet Take 1,000 mcg by mouth daily.    Yes [provider]  acetaminophen (TYLENOL) 325 MG tablet Take 325 mg by mouth every 6 (six) hours as needed for moderate pain.     [provider]  albuterol (VENTOLIN HFA) 108 (90 Base) MCG/ACT inhaler Inhale 2 puffs into the lungs every 6 (six) hours as needed for wheezing. 08/14/20 08/14/21  Kuneff, Renee A, DO  lidocaine-prilocaine (EMLA) cream Apply 1 application topically as needed. Patient taking differently: Apply 1 application topically as needed (port access). 04/12/20   Orson Slick, MD     Vital Signs: BP (!) 170/73    Pulse 67    Temp 97.8 F (36.6 C) (Oral)    Resp 20    SpO2 100%   Physical Exam patient  awake, answering simple questions okay.  Chest clear to auscultation bilaterally.  Clean, intact right chest wall Port-A-Cath.  Heart with regular rate and rhythm.  Abdomen soft, positive bowel sounds, some mild mid abdominal tenderness to palpation.  No lower extremity edema.  Imaging: No results found.  Labs:  CBC: Recent Labs    05/09/21 1014 06/05/21 1527 07/22/21 1544 08/05/21 1005  WBC 14.5* 9.4 8.7 6.4  HGB 12.8 13.6 12.9 12.0  HCT 36.2 40.0 37.9 33.5*  PLT 171 179 158 160    COAGS: No results for input(s): INR, APTT in the last 8760 hours.  BMP: Recent Labs    03/07/21 1011 05/09/21 1014 06/05/21 1527 07/22/21 1544 08/05/21 1005  NA 140 141 138 142 139  K 3.8 3.8 2.9* 4.0 3.7  CL 107 106 104 102 106  CO2 24 24 24 26 23   GLUCOSE 117* 115* 148* 94 98  BUN 17 30* 11 38* 31*  CALCIUM 9.1 9.7 9.3 9.6 9.1  CREATININE 1.28* 1.21* 0.98 0.98 1.24*  GFRNONAA 43* 46* 59*  --  44*    LIVER FUNCTION TESTS: Recent Labs    05/09/21 1014 06/05/21 1527 07/22/21 1544 08/05/21 1005  BILITOT 0.5 0.8 0.3 0.3  AST 15 20 14 18   ALT 15 12 11 16   ALKPHOS 78 83 88 77  PROT 6.9 7.3 6.3 6.7  ALBUMIN 4.2 4.5 4.7 4.1    Assessment and Plan: Pt known to IR service from port a cath placement on 03/13/20.  She has a history of low-grade marginal zone lymphoma and is currently in remission.  She is no longer using her Port-A-Cath and presents today for Port-A-Cath removal.  Details/risks of procedure, including but not limited to, internal bleeding, infection, injury to adjacent structures discussed with patient with her understanding and consent.   Electronically Signed: D. Rowe Robert, PA-C 10/01/2021, 12:43 PM   I spent a total of 20 minutes at the the patient's bedside AND on the patient's hospital floor or unit, greater than 50% of which was counseling/coordinating care for Port-A-Cath removal

## 2021-10-01 NOTE — Progress Notes (Signed)
Patient presents for PAC removal. She complains of vague chest discomfort for a couple of days. Has GERD. She states she has dementia and cannot remember her meds or medical history well. EKG done.

## 2021-10-01 NOTE — Discharge Instructions (Signed)
Interventional radiology phone numbers [phone] After hours [phone]  Implanted Port Removal, Care After This sheet gives you information about how to care for yourself after your procedure. Your health care provider may also give you more specific instructions. If you have problems or questions, contact your health care provider. What can I expect after the procedure? After the procedure, it is common to have: Soreness or pain near your incision. Some swelling or bruising near your incision. Follow these instructions at home: Medicines Take over-the-counter and prescription medicines only as told by your health care provider. If you were prescribed an antibiotic medicine, take it as told by your health care provider. Do not stop taking the antibiotic even if you start to feel better. Bathing Do not take baths, swim, or use a hot tub until your health care provider approves. You may shower tomorrow. Remove your dressing prior to your shower. Incision care Follow instructions from your health care provider about how to take care of your incision. Make sure you: Wash your hands with soap and water before you change your bandage (dressing). If soap and water are not available, use hand sanitizer. Keep your dressing dry. Leave  skin glue in place. These skin closures may need to stay in place for 2 weeks or longer. . Check your incision area every day for signs of infection. Check for: More redness, swelling, or pain. More fluid or blood. Warmth. Pus or a bad smell.   Driving Do not drive for 24 hours if you were given a medicine to help you relax (sedative) during your procedure. If you did not receive a sedative, ask your health care provider when it is safe to drive.   Activity Return to your normal activities as told by your health care provider. Ask your health care provider what activities are safe for you. Do not lift anything that is heavier than 10 lb (4.5 kg), or the  limit that you are told, until your health care provider says that it is safe. Do not do activities that involve lifting your arms over your head. General instructions Do not use any products that contain nicotine or tobacco, such as cigarettes and e-cigarettes. These can delay healing. If you need help quitting, ask your health care provider. Keep all follow-up visits as told by your health care provider. This is important. Contact a health care provider if: You have more redness, swelling, or pain around your incision. You have more fluid or blood coming from your incision. Your incision feels warm to the touch. You have pus or a bad smell coming from your incision. You have pain that is not relieved by your pain medicine. Get help right away if you have: A fever or chills. Chest pain. Difficulty breathing. Summary After the procedure, it is common to have pain, soreness, swelling, or bruising near your incision. If you were prescribed an antibiotic medicine, take it as told by your health care provider. Do not stop taking the antibiotic even if you start to feel better. Do not drive for 24 hours if you were given a sedative during your procedure. Return to your normal activities as told by your health care provider. Ask your health care provider what activities are safe for you. This information is not intended to replace advice given to you by your health care provider. Make sure you discuss any questions you have with your health care provider. Document Revised: 10/15/2017 Document Reviewed: 10/15/2017 Elsevier Patient Education  2021 Elsevier Inc.       Moderate Conscious Sedation, Adult, Care After This sheet gives you information about how to care for yourself after your procedure. Your health care provider may also give you more specific instructions. If you have problems or questions, contact your health care provider. What can I expect after the procedure? After the procedure,  it is common to have: Sleepiness for several hours. Impaired judgment for several hours. Difficulty with balance. Vomiting if you eat too soon. Follow these instructions at home: For the time period you were told by your health care provider: Rest. Do not participate in activities where you could fall or become injured. Do not drive or use machinery. Do not drink alcohol. Do not take sleeping pills or medicines that cause drowsiness. Do not make important decisions or sign legal documents. Do not take care of children on your own.      Eating and drinking Follow the diet recommended by your health care provider. Drink enough fluid to keep your urine pale yellow. If you vomit: Drink water, juice, or soup when you can drink without vomiting. Make sure you have little or no nausea before eating solid foods.   General instructions Take over-the-counter and prescription medicines only as told by your health care provider. Have a responsible adult stay with you for the time you are told. It is important to have someone help care for you until you are awake and alert. Do not smoke. Keep all follow-up visits as told by your health care provider. This is important. Contact a health care provider if: You are still sleepy or having trouble with balance after 24 hours. You feel light-headed. You keep feeling nauseous or you keep vomiting. You develop a rash. You have a fever. You have redness or swelling around the IV site. Get help right away if: You have trouble breathing. You have new-onset confusion at home. Summary After the procedure, it is common to feel sleepy, have impaired judgment, or feel nauseous if you eat too soon. Rest after you get home. Know the things you should not do after the procedure. Follow the diet recommended by your health care provider and drink enough fluid to keep your urine pale yellow. Get help right away if you have trouble breathing or new-onset  confusion at home. This information is not intended to replace advice given to you by your health care provider. Make sure you discuss any questions you have with your health care provider. Document Revised: 12/30/2019 Document Reviewed: 07/28/2019 Elsevier Patient Education  2021 Elsevier Inc.  

## 2021-10-22 ENCOUNTER — Ambulatory Visit: Payer: Medicare Other | Admitting: Internal Medicine

## 2021-10-25 ENCOUNTER — Telehealth: Payer: Self-pay | Admitting: Hematology and Oncology

## 2021-10-25 NOTE — Telephone Encounter (Signed)
Called patient regarding upcoming appointments, left a voicemail. 

## 2021-11-04 ENCOUNTER — Other Ambulatory Visit: Payer: Self-pay

## 2021-11-04 ENCOUNTER — Other Ambulatory Visit: Payer: Self-pay | Admitting: Hematology and Oncology

## 2021-11-04 ENCOUNTER — Inpatient Hospital Stay: Payer: Medicare HMO | Attending: Hematology and Oncology

## 2021-11-04 ENCOUNTER — Inpatient Hospital Stay (HOSPITAL_BASED_OUTPATIENT_CLINIC_OR_DEPARTMENT_OTHER): Payer: Medicare HMO | Admitting: Hematology and Oncology

## 2021-11-04 VITALS — BP 142/68 | HR 65 | Temp 96.9°F | Resp 17 | Wt 124.2 lb

## 2021-11-04 DIAGNOSIS — C8514 Unspecified B-cell lymphoma, lymph nodes of axilla and upper limb: Secondary | ICD-10-CM | POA: Diagnosis not present

## 2021-11-04 DIAGNOSIS — C8580 Other specified types of non-Hodgkin lymphoma, unspecified site: Secondary | ICD-10-CM

## 2021-11-04 LAB — CMP (CANCER CENTER ONLY)
ALT: 10 U/L (ref 0–44)
AST: 17 U/L (ref 15–41)
Albumin: 4.5 g/dL (ref 3.5–5.0)
Alkaline Phosphatase: 90 U/L (ref 38–126)
Anion gap: 7 (ref 5–15)
BUN: 26 mg/dL — ABNORMAL HIGH (ref 8–23)
CO2: 29 mmol/L (ref 22–32)
Calcium: 9.8 mg/dL (ref 8.9–10.3)
Chloride: 106 mmol/L (ref 98–111)
Creatinine: 1.48 mg/dL — ABNORMAL HIGH (ref 0.44–1.00)
GFR, Estimated: 36 mL/min — ABNORMAL LOW (ref >60)
Glucose, Bld: 110 mg/dL — ABNORMAL HIGH (ref 70–99)
Potassium: 4.2 mmol/L (ref 3.5–5.1)
Sodium: 142 mmol/L (ref 135–145)
Total Bilirubin: 0.6 mg/dL (ref 0.3–1.2)
Total Protein: 6.9 g/dL (ref 6.5–8.1)

## 2021-11-04 LAB — CBC WITH DIFFERENTIAL (CANCER CENTER ONLY)
Abs Immature Granulocytes: 0.01 10*3/uL (ref 0.00–0.07)
Basophils Absolute: 0 10*3/uL (ref 0.0–0.1)
Basophils Relative: 1 %
Eosinophils Absolute: 0.3 10*3/uL (ref 0.0–0.5)
Eosinophils Relative: 5 %
HCT: 39.6 % (ref 36.0–46.0)
Hemoglobin: 13.7 g/dL (ref 12.0–15.0)
Immature Granulocytes: 0 %
Lymphocytes Relative: 40 %
Lymphs Abs: 2.7 10*3/uL (ref 0.7–4.0)
MCH: 30.3 pg (ref 26.0–34.0)
MCHC: 34.6 g/dL (ref 30.0–36.0)
MCV: 87.6 fL (ref 80.0–100.0)
Monocytes Absolute: 0.6 10*3/uL (ref 0.1–1.0)
Monocytes Relative: 9 %
Neutro Abs: 3 10*3/uL (ref 1.7–7.7)
Neutrophils Relative %: 45 %
Platelet Count: 189 10*3/uL (ref 150–400)
RBC: 4.52 MIL/uL (ref 3.87–5.11)
RDW: 12.4 % (ref 11.5–15.5)
WBC Count: 6.7 10*3/uL (ref 4.0–10.5)
nRBC: 0 % (ref 0.0–0.2)

## 2021-11-04 LAB — LACTATE DEHYDROGENASE: LDH: 157 U/L (ref 98–192)

## 2021-11-04 NOTE — Progress Notes (Signed)
Elizabeth [phone]   Fax:([phone]  PROGRESS NOTE  Patient Care Team: Lindell Spar, MD as PCP - General (Internal Medicine) Danie Binder, MD (Inactive) as Consulting Physician (Gastroenterology) Minus Breeding, MD as Consulting Physician (Cardiology) Annitta Needs, NP (Gastroenterology) Carlis Stable, NP as Nurse Practitioner (Gastroenterology) Montez Morita, Quillian Quince, MD as Consulting Physician (Gastroenterology) Eloise Harman, DO as Consulting Physician (Internal Medicine) Cameron Sprang, MD as Consulting Physician (Neurology)  Hematological/Oncological History  # Low Grade Marginal Zone Lymphoma, Stage III 1) 06/06/2019: patient underwent a diagnostic mammogram of the left breast. Calcifications noted in the left breast, recommended 6 month f/u imaging. 2) 02/02/2020: bilateral diagnostic mammogram showed abnormal enlarged lymph nodes with cortical thickening. The largest of these measures 2.2 x 1.7 x 2.0 cm.  3) 02/02/2020: US guided biopsy of left axillary lymph nodes performed. 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 The overall features are atypical and highly suspicious for non-Hodgkin B-cell lymphoma, particularly marginal zone lymphoma. 4) 02/16/2020: establish care with Dr. Lorenso Courier  5) 02/29/2020: PET CT scan performed, showed hypermetabolic lymph nodes identified in the posterior left neck, both axillary/subpectoral regions, mediastinum, hila, right external iliac chain, and right groin. Massive splenomegaly of 17.8 cm noted as well.  6) 03/20/2020: excisional biopsy of left posterior cervical lymph node. Biopsy confirmed most likely low grade marginal zone lymphoma.   7) 7/14-7/15/2021: Cycle 1 Day 1 of Rituximab/Bendamycin 8) 03/30/2020-04/01/2020: admitted inpatient for hematemesis. Noted to have marked esophagitis on EGD.  9) 04/25/2020: IV feraheme 510mg   administered 10) 04/26/2020: Cycle 2 Day 1 of Rituximab/Bendamycin 11) 05/10/2020: HOLD R-Benda in setting of colitis, GI bleed, and intolerance to therapy. Plan to start monotherapy Rituximab once patient has rebounded.  12) 07/06/2020: Cycle 1 Week 1 of monotherapy rituximab 13) 07/30/2020: Cycle 1 Week 3 of monotherapy rituximab 14) 08/06/2020: Completed Cycle 1 of monotherapy rituximab 15)  09/12/2020: PET CT scan shows completed resolution of tumor and FDG avid lesions. Deauville Score 1. Complete response.  16) 11/16/2020: Cycle 1 Day 1 of Maintenance Rituximab  17) 01/10/2021:  Cycle 2 Day 1 of Maintenance Rituximab  18) 03/07/2021: Cycle 3 Day 1 of Maintenance Rituximab  19) 05/09/2021: Cycle 4 Day 1 of Maintenance Rituximab  20) 08/12/2021: PET CT scan showed no evidence of residual recurrent disease.  Interval History:  Elizabeth Mcintyre 81 y.o. 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   On exam today, Elizabeth Mcintyre reports she is feeling good overall.  She reports her energy is good though her appetite has been poor as she does not have a desire to eat.  She notes that she has not been doing well with keeping up with her hydration either.  She had about 2 pounds of weight loss in the interim since her last visit.  She did recently undergo a move into a new smaller home.  She reports that her urine has not been dark in color but does occasionally have an odor to it..  She denies any fevers, chills, night sweats, shortness of breath, chest pain, cough, neuropathy or skin changes. The rest of the 10 point ROS is negative.    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   Lower GI bleeding 04/30/2020   Lymphadenopathy 01/23/2020   Osteopenia 2017   Last  bone density 05/04/2017: -2.4   PONV (postoperative nausea and vomiting)    Schatzki's ring    Stress incontinence    Stroke ([place])    mini stroke - found on a CT scan    SURGICAL HISTORY: Past Surgical History:  Procedure Laterality Date   ABDOMINAL HYSTERECTOMY     BALLOON DILATION N/A 08/21/2020   Procedure: BALLOON DILATION;  Surgeon: Eloise Harman, DO;  [facility];  Service: Endoscopy;  Laterality: N/A;   BIOPSY  03/30/2020   Procedure: BIOPSY;  Surgeon: Ronald Lobo, MD;  Location: [facility];  Service: Endoscopy;;   BIOPSY  05/02/2020   Procedure: BIOPSY;  Surgeon: Montez Morita, Quillian Quince, MD;  Location: [facility];  Service: Gastroenterology;;   CARDIAC CATHETERIZATION     X 2, last one in [place] 04/13/2017   Procedure: LAPAROSCOPIC CHOLECYSTECTOMY;  Surgeon: Aviva Signs, MD;  Location: [facility];  Service: General;  Laterality: N/A;   COLONOSCOPY     COLONOSCOPY  May 2012   Dr. Olevia Perches: mild diverticulosis, otherwise normal.    COLONOSCOPY WITH PROPOFOL N/A 05/02/2020   Dr. Jenetta Downer: 8 mm polyp removed from the ascending colon, 2 mm polyp removed from the ascending colon.  Tubular adenomas.  Diverticulosis.  Mucosal ulceration in the sigmoid colon noted, pathology consistent with ischemic colitis.   ESOPHAGOGASTRODUODENOSCOPY N/A 01/28/2015   Dr. Gala Romney: reflux esophagitis, Schatzki's ring not manipulated due to recent  bleeding   ESOPHAGOGASTRODUODENOSCOPY N/A 03/30/2015   Dr. Gala Romney: Schatzki's ring s/p Venia Minks dilation, previously noted esophageal ulcer completely healed   ESOPHAGOGASTRODUODENOSCOPY N/A 03/30/2020   Buccini: Moderately severe erosive, circumferential, confluent esophagitis with no bleeding found 25 to 40 cm from incisors.  Nonobstructing and mild Schatzki ring, there were also multiple distal esophageal rings noted, minimal hiatal hernia.   ESOPHAGOGASTRODUODENOSCOPY (EGD) WITH PROPOFOL N/A 08/21/2020   Procedure: ESOPHAGOGASTRODUODENOSCOPY (EGD) WITH PROPOFOL;  Surgeon: Eloise Harman, DO;  [facility];  Service: Endoscopy;  Laterality: N/A;  2:15pm   IR IMAGING GUIDED PORT INSERTION  03/13/2020   IR REMOVAL TUN ACCESS W/ PORT W/O FL MOD SED  10/01/2021   LYMPH NODE BIOPSY Left 03/20/2020   Procedure: LEFT POSTERIOR CERVICAL LYMPH NODE BIOPSY;  Surgeon: Georganna Skeans, MD;  Location: [facility];  Service: General;  Laterality: Left;   MALONEY DILATION N/A 03/30/2015   Procedure: Keturah Shavers;  Surgeon: Daneil Dolin, MD;  [facility];  Service: Endoscopy;  Laterality: N/A;   POLYPECTOMY  05/02/2020   Procedure: POLYPECTOMY;  Surgeon: Harvel Quale, MD;  [facility];  Service: Gastroenterology;;    SOCIAL HISTORY: Social History   Socioeconomic History  Marital status: Widowed    Spouse name: Not on file   Number of children: 2   Years of education: Not on file   Highest education level: Not on file  Occupational History   Occupation: retired  Tobacco Use   Smoking status: Never   Smokeless tobacco: Never  Vaping Use   Vaping Use: Never used  Substance and Sexual Activity   Alcohol use: No   Drug use: No   Sexual activity: Never  Other Topics Concern   Not on file  Social History Narrative   Ms. Mormino is widowed. Her young grandson lives with her, for whom she shares custody with her daughter, the son's aunt.     Right handed     Social Determinants of Health   Financial Resource Strain: Low Risk    Difficulty of Paying Living Expenses: Not hard at all  Food Insecurity: No Food Insecurity   Worried About Charity fundraiser in the Last Year: Never true   Ran Out of Food in the Last Year: Never true  Transportation Needs: No Transportation Needs   Lack of Transportation (Medical): No   Lack of Transportation (Non-Medical): No  Physical Activity: Insufficiently Active   Days of Exercise per Week: 5 days   Minutes of Exercise per Session: 10 min  Stress: No Stress Concern Present   Feeling of Stress : Only a little  Social Connections: Socially Isolated   Frequency of Communication with Friends and Family: More than three times a week   Frequency of Social Gatherings with Friends and Family: More than three times a week   Attends Religious Services: Never   Marine scientist or Organizations: No   Attends Archivist Meetings: Never   Marital Status: Widowed  Human resources officer Violence: Not At Risk   Fear of Current or Ex-Partner: No   Emotionally Abused: No   Physically Abused: No   Sexually Abused: No    FAMILY HISTORY: Family History  Problem Relation Age of Onset   Heart disease Mother    Osteoarthritis Mother    Sudden death Father    Single kidney Father    Other Father        h/o severe MVA injuries   Hyperlipidemia Sister    Other Daughter        Myalgias   Fibromyalgia Daughter    Allergies Daughter    Heart disease Maternal Grandfather    Sudden death Paternal Grandmother    Diabetes Paternal Grandfather    Heart disease Daughter    Other Daughter        palpitations   Pulmonary fibrosis Maternal Aunt    Cancer Paternal Uncle    Pulmonary fibrosis Maternal Aunt    Colon cancer Neg Hx     ALLERGIES:  is allergic to codeine phosphate.  MEDICATIONS:  Current Outpatient Medications  Medication Sig Dispense Refill   acetaminophen (TYLENOL) 325 MG tablet Take 325  mg by mouth every 6 (six) hours as needed for moderate pain.      albuterol (VENTOLIN HFA) 108 (90 Base) MCG/ACT inhaler Inhale 2 puffs into the lungs every 6 (six) hours as needed for wheezing. 8.5 g 2   amLODipine (NORVASC) 10 MG tablet Take 1 tablet (10 mg total) by mouth daily. 90 tablet 1   Ascorbic Acid (VITAMIN C) 100 MG tablet Take 100 mg by mouth daily.     cetirizine (ZYRTEC) 10 MG tablet Take 10 mg by mouth at  bedtime. As needed     Cholecalciferol (VITAMIN D3) 25 MCG (1000 UT) CAPS Take 1,000 Units by mouth daily.      donepezil (ARICEPT) 10 MG tablet Take 1 tablet daily 30 tablet 11   levothyroxine (SYNTHROID) 75 MCG tablet Take 1 tablet (75 mcg total) by mouth daily. 30 tablet 3   lidocaine-prilocaine (EMLA) cream Apply 1 application topically as needed. (Patient taking differently: Apply 1 application topically as needed (port access).) 30 g 1   mirtazapine (REMERON) 7.5 MG tablet Take 1 tablet (7.5 mg total) by mouth at bedtime. 30 tablet 3   pantoprazole (PROTONIX) 40 MG tablet Take 1 tablet (40 mg total) by mouth daily. 90 tablet 3   potassium chloride SA (KLOR-CON) 20 MEQ tablet Take 1 tablet (20 mEq total) by mouth daily. 21 tablet 0   rOPINIRole (REQUIP) 0.25 MG tablet Take 1 tablet (0.25 mg total) by mouth at bedtime. 90 tablet 1   rosuvastatin (CRESTOR) 20 MG tablet Take 1 tablet (20 mg total) by mouth daily. 90 tablet 3   sucralfate (CARAFATE) 1 g tablet Take 1 g by mouth 4 (four) times daily -  with meals and at bedtime. As needed (Patient not taking: Reported on 11/04/2021)     vitamin B-12 (CYANOCOBALAMIN) 1000 MCG tablet Take 1,000 mcg by mouth daily.      No current facility-administered medications for this visit.    REVIEW OF SYSTEMS:   Constitutional: ( - ) fevers, ( - )  chills , ( - ) night sweats Eyes: ( - ) blurriness of vision, ( - ) double vision, ( - ) watery eyes Ears, nose, mouth, throat, and face: ( - ) mucositis, ( - ) sore throat Respiratory: ( - )  cough, ( - ) dyspnea, ( - ) wheezes Cardiovascular: ( - ) palpitation, ( - ) chest discomfort, ( - ) lower extremity swelling Gastrointestinal:  ( - ) nausea, ( - ) heartburn, ( - ) change in bowel habits Skin: ( - ) abnormal skin rashes Lymphatics: ( - ) new lymphadenopathy, ( - ) easy bruising Neurological: ( - ) numbness, ( - ) tingling, ( - ) new weaknesses Behavioral/Psych: ( - ) mood change, ( - ) new changes  All other systems were reviewed with the patient and are negative.  PHYSICAL EXAMINATION: ECOG PERFORMANCE STATUS: 1 - Symptomatic but completely ambulatory  Vitals:   11/04/21 1144  BP: (!) 142/68  Pulse: 65  Resp: 17  Temp: (!) 96.9 F (36.1 C)  SpO2: 100%    Filed Weights   11/04/21 1144  Weight: 124 lb 3.2 oz (56.3 kg)   GENERAL: well appearing elderly Caucasian female in NAD  SKIN: skin color, texture, turgor are normal. EYES: conjunctiva are pink and non-injected, sclera clear NECK: supple, non-tender.  LYMPH: no cervical, supraclavicular, or axillary lymph nodes palpated.  LUNGS: clear to auscultation and percussion with normal breathing effort HEART: regular rate & rhythm and no murmurs and no lower extremity edema Musculoskeletal: no cyanosis of digits and no clubbing  PSYCH: alert & oriented x 3, fluent speech NEURO: no focal motor/sensory deficits  LABORATORY DATA:  I have reviewed the data as listed CBC Latest Ref Rng & Units 11/04/2021 08/05/2021 07/22/2021  WBC 4.0 - 10.5 K/uL 6.7 6.4 8.7  Hemoglobin 12.0 - 15.0 g/dL 13.7 12.0 12.9  Hematocrit 36.0 - 46.0 % 39.6 33.5(L) 37.9  Platelets 150 - 400 K/uL 189 160 158    CMP Latest Ref  Rng & Units 11/04/2021 08/05/2021 07/22/2021  Glucose 70 - 99 mg/dL 110(H) 98 94  BUN 8 - 23 mg/dL 26(H) 31(H) 38(H)  Creatinine 0.44 - 1.00 mg/dL 1.48(H) 1.24(H) 0.98  Sodium 135 - 145 mmol/L 142 139 142  Potassium 3.5 - 5.1 mmol/L 4.2 3.7 4.0  Chloride 98 - 111 mmol/L 106 106 102  CO2 22 - 32 mmol/L 29 23 26    Calcium 8.9 - 10.3 mg/dL 9.8 9.1 9.6  Total Protein 6.5 - 8.1 g/dL 6.9 6.7 6.3  Total Bilirubin 0.3 - 1.2 mg/dL 0.6 0.3 0.3  Alkaline Phos 38 - 126 U/L 90 77 88  AST 15 - 41 U/L 17 18 14   ALT 0 - 44 U/L 10 16 11     RADIOGRAPHIC STUDIES: No results found.  ASSESSMENT & PLAN JOE TANNEY 81 y.o. 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  Previously I recommend a rituximab monotherapy given her intolerance of BR x 2 cycles. This regimen consisted of rituximab 375 mg/m2 IV q weekly x 4 weeks. During her scans on 8/16 - 05/03/2020 she was shown to have marked response to therapy with normalization of spleen size and resolution of lymphadenopathy. Repeat PET CT scan on 09/12/20 after completed of rituximab monotherapy showed a completed response. We will proceed with q 2 months maintenance/consolidation rituximab x 4 cycles.   GELF Criteria: 1 (splenomegaly). Indication for treatment  # Low Grade Marginal Zone Lymphoma, Stage III -- discontinued bendamustine + rituximab as patient is unable to tolerate this treatment. Will pursue rituximab monotherapy instead. Started therapy with Cycle 1 Day 1 on 03/28/2020 BR, decreased to Ritux alone q weekly x 4 starting 07/06/2020. --patient completed excisional lymph node biopsy to confirm the diagnosis. Pathological exam of the lymph node confirms marginal zone lymphoma.  --PET CT scan confirms stage III disease. Patient meets GELF criteria for treatment (splenomegaly). She did not have any B symptoms and her counts were stable at diagnosis, with some mild thrombocytopenia.  --Patient completed monotherapy rituximab x 4 weeks on 08/06/2020.  --post treatment PET CT scan from 09/12/20 showed Deauville Score 1, complete response to therapy. Recommend to proceed with q 8 week rituximab 375mg /m2 maintenance therapy. This was continued for 4 doses --Patient started C1D1 of  maintenance Rituximab on 11/16/2020.  --Patient completed 4 cycles of maintenance Rituximab.  Plan: -- PET scan performed on 08/13/2021 showed no evidence of residual disease.  We will plan for repeat CT scan in May 2022.  If that scan shows no evidence of residual recurrent disease no further scans are recommended. --Return to the clinic in 3 months for continued monitoring.   Orders Placed This Encounter  Procedures   CT CHEST ABDOMEN PELVIS W CONTRAST    Standing Status:   Future    Standing Expiration Date:   11/04/2022    Order Specific Question:   Preferred imaging location?    Answer:   [facility]    Order Specific Question:   Is Oral Contrast requested for this exam?    Answer:   Yes, Per Radiology protocol   All questions were answered. The patient knows to call the clinic with any problems, questions or concerns.  A total of more than 30 minutes were spent on this encounter and over half of that time was spent on counseling and coordination of care as outlined above.   Ledell Peoples, MD Department of Hematology/Oncology  [place] [facility] Phone: [phone] Pager: [phone] Email: [email or web address]  11/04/2021 4:33 PM   Literature Support:  NCCN Guidelines: Consolidation with rituximab should be offered if initially treated with single agent rituximab. (MS-63)  UptoDate: Different schedules have been used. The following administration schedules were used in the randomized trials and are equally acceptable approaches:  ?Rituximab 375 mg/m2 intravenously per week for a total of four doses  ?Rituximab 375 mg/m2 intravenously per week for four weeks, followed by four additional doses administered every two months

## 2021-11-05 ENCOUNTER — Telehealth: Payer: Self-pay | Admitting: Hematology and Oncology

## 2021-11-05 NOTE — Telephone Encounter (Signed)
Scheduled per 2/20 los, pt daughter has been called and confirmed appt

## 2021-11-20 ENCOUNTER — Encounter: Payer: Self-pay | Admitting: Hematology and Oncology

## 2022-01-01 ENCOUNTER — Emergency Department (HOSPITAL_COMMUNITY)
Admission: EM | Admit: 2022-01-01 | Discharge: 2022-01-01 | Disposition: A | Discharge status: Home / Self Care | Payer: Medicare HMO | Attending: Emergency Medicine | Admitting: Emergency Medicine

## 2022-01-01 ENCOUNTER — Other Ambulatory Visit: Payer: Self-pay

## 2022-01-01 ENCOUNTER — Encounter (HOSPITAL_COMMUNITY): Payer: Self-pay

## 2022-01-01 ENCOUNTER — Emergency Department (HOSPITAL_COMMUNITY): Payer: Medicare HMO

## 2022-01-01 DIAGNOSIS — N183 Chronic kidney disease, stage 3 unspecified: Secondary | ICD-10-CM | POA: Diagnosis not present

## 2022-01-01 DIAGNOSIS — Z79899 Other long term (current) drug therapy: Secondary | ICD-10-CM | POA: Insufficient documentation

## 2022-01-01 DIAGNOSIS — R0789 Other chest pain: Secondary | ICD-10-CM | POA: Diagnosis not present

## 2022-01-01 DIAGNOSIS — I129 Hypertensive chronic kidney disease with stage 1 through stage 4 chronic kidney disease, or unspecified chronic kidney disease: Secondary | ICD-10-CM | POA: Diagnosis not present

## 2022-01-01 DIAGNOSIS — R519 Headache, unspecified: Secondary | ICD-10-CM | POA: Insufficient documentation

## 2022-01-01 DIAGNOSIS — R079 Chest pain, unspecified: Secondary | ICD-10-CM | POA: Diagnosis not present

## 2022-01-01 LAB — CBC
HCT: 40.9 % (ref 36.0–46.0)
Hemoglobin: 14.1 g/dL (ref 12.0–15.0)
MCH: 30.4 pg (ref 26.0–34.0)
MCHC: 34.5 g/dL (ref 30.0–36.0)
MCV: 88.1 fL (ref 80.0–100.0)
Platelets: 186 10*3/uL (ref 150–400)
RBC: 4.64 MIL/uL (ref 3.87–5.11)
RDW: 12.9 % (ref 11.5–15.5)
WBC: 7.7 10*3/uL (ref 4.0–10.5)
nRBC: 0 % (ref 0.0–0.2)

## 2022-01-01 LAB — BASIC METABOLIC PANEL
Anion gap: 9 (ref 5–15)
BUN: 23 mg/dL (ref 8–23)
CO2: 26 mmol/L (ref 22–32)
Calcium: 9.6 mg/dL (ref 8.9–10.3)
Chloride: 103 mmol/L (ref 98–111)
Creatinine, Ser: 1.14 mg/dL — ABNORMAL HIGH (ref 0.44–1.00)
GFR, Estimated: 49 mL/min — ABNORMAL LOW (ref >60)
Glucose, Bld: 146 mg/dL — ABNORMAL HIGH (ref 70–99)
Potassium: 3.3 mmol/L — ABNORMAL LOW (ref 3.5–5.1)
Sodium: 138 mmol/L (ref 135–145)

## 2022-01-01 LAB — TROPONIN I (HIGH SENSITIVITY)
Troponin I (High Sensitivity): 2 ng/L (ref <18)
Troponin I (High Sensitivity): 3 ng/L (ref <18)

## 2022-01-01 IMAGING — DX DG CHEST 2V
2 series · 2 of 2 positions shown · non-contrast
Comparison: Chest x-ray dated [DATE]

CLINICAL DATA: Chest pain

EXAM:
CHEST - 2 VIEW

[chest lat]
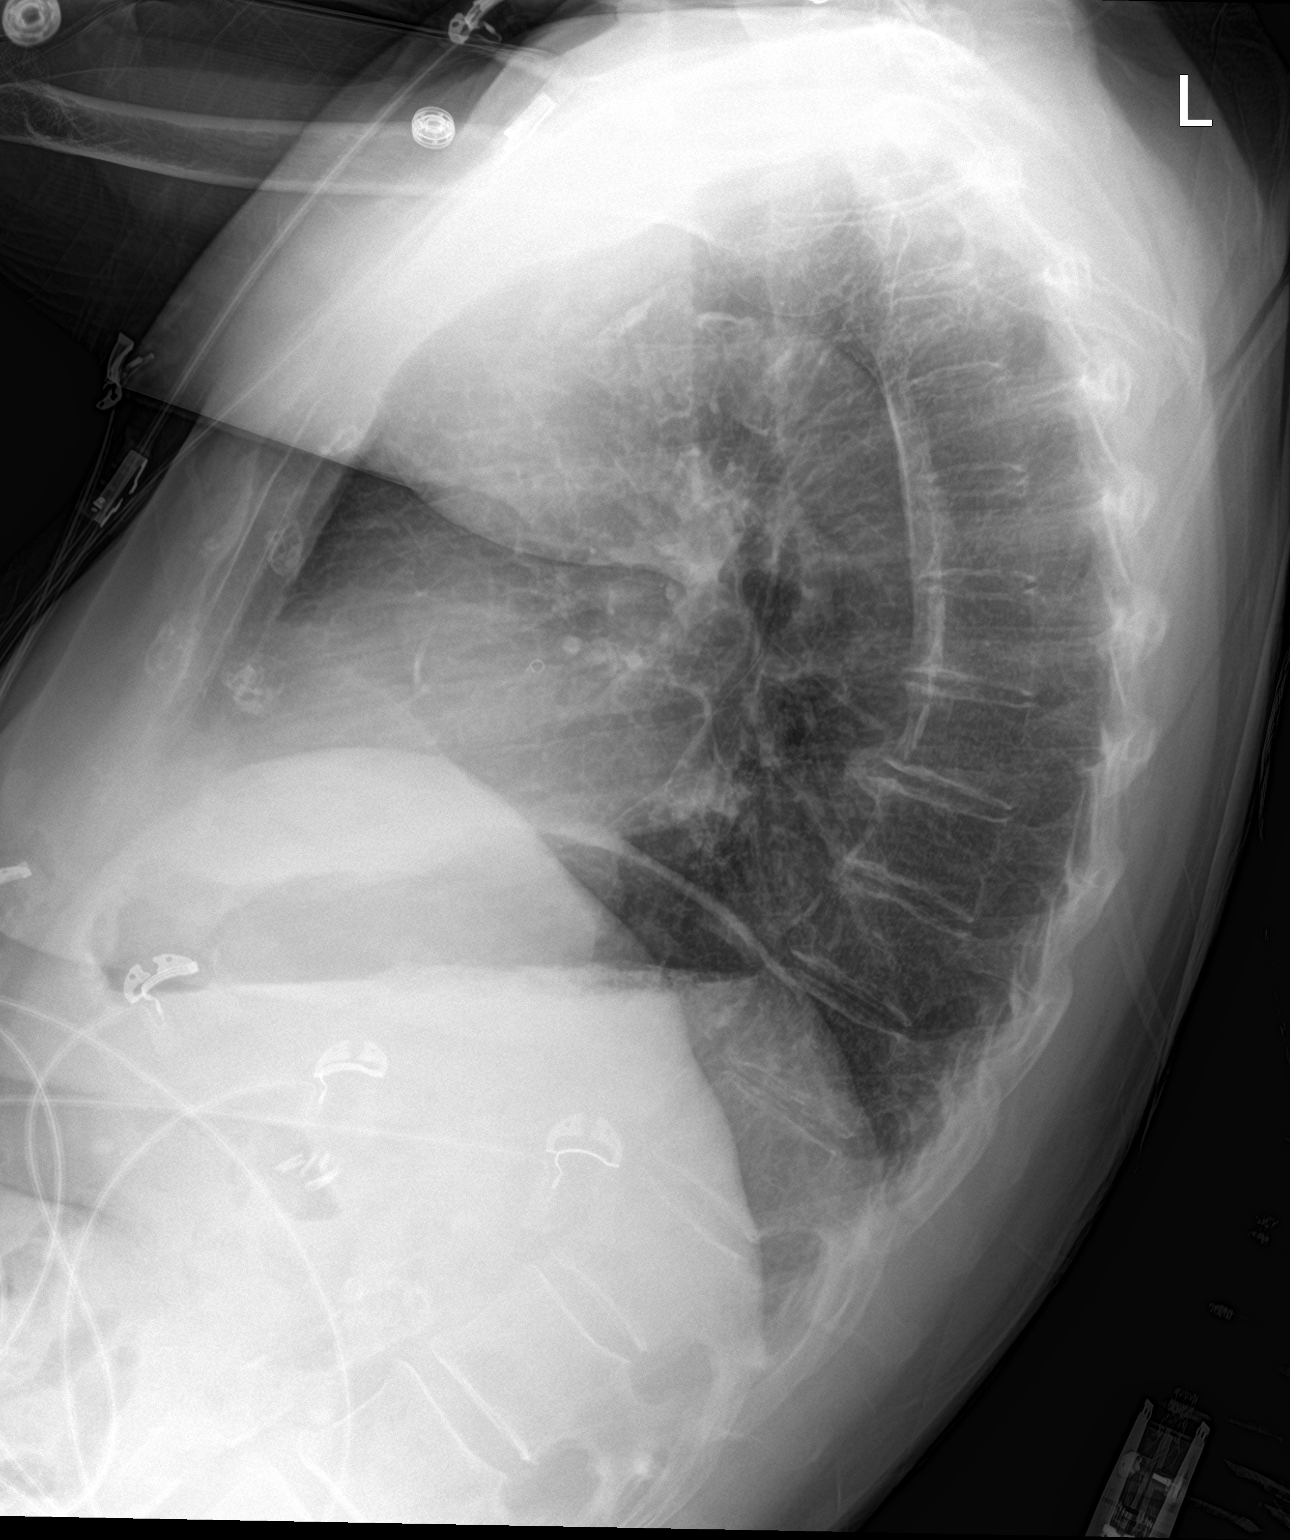

[chest ap]
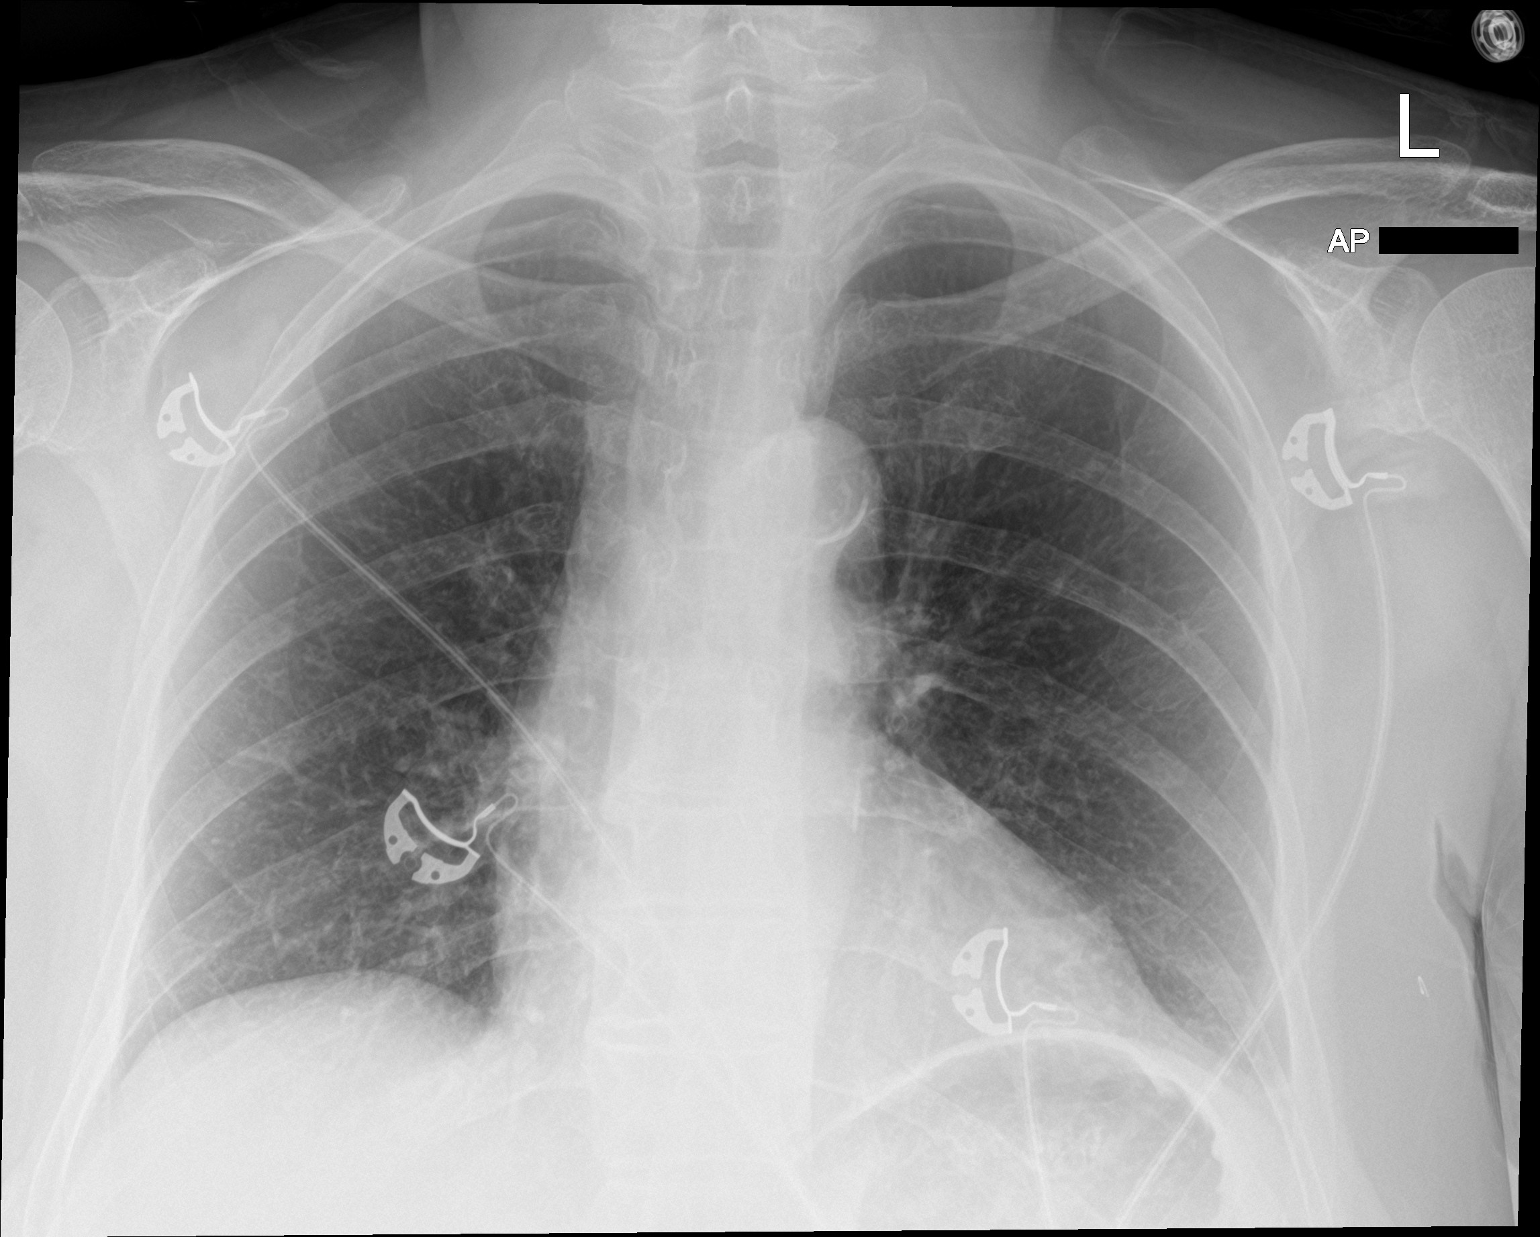

[2 of 2 positions shown; findings below may reference images not displayed]

FINDINGS: Cardiac and mediastinal contours within normal limits. Mild left
basilar opacity, likely atelectasis. Lungs otherwise clear. No
pleural effusion or pneumothorax.
IMPRESSION: No active cardiopulmonary disease.

## 2022-01-01 MED ORDER — KETOROLAC TROMETHAMINE 15 MG/ML IJ SOLN
15.0000 mg | Freq: Once | INTRAMUSCULAR | Status: AC
  Administered 2022-01-01: 15 mg via INTRAVENOUS
  Filled 2022-01-01: qty 1

## 2022-01-01 MED ORDER — FENTANYL CITRATE PF 50 MCG/ML IJ SOSY
PREFILLED_SYRINGE | INTRAMUSCULAR | Status: AC
  Filled 2022-01-01: qty 2

## 2022-01-01 NOTE — ED Triage Notes (Signed)
Patient with complaints of chest pressure, weakness, headache, and diarrhea for a couple days.  ?

## 2022-01-01 NOTE — ED Provider Notes (Signed)
This patient is a very pleasant 81 year old female presenting with a left-sided headache which she states has been intermittent and fluctuating for quite some time, states that she is not compliant with her blood pressure medications and has not been taking her amlodipine.  She states that she figured she did not need it anymore.  She has also had some fleeting chest pain over the last couple of weeks and some diarrhea.  She has had a lot of work stresses at home.  On my exam the patient is neurologically intact with totally normal cranial nerves III through XII, normal gross visual acuity, her strength is normal in all 4 extremities, her heart sounds are normal, lung sounds are normal and she is in no distress.  Labs will be ordered, EKG is unremarkable, blood pressure has spontaneously improved and is now about 180/80.  The patient was encouraged by myself to take her blood pressure medication as prescribed we will discuss this with family if we can get them on the phone as well.  The patient otherwise appears medically stable and I doubt that this is an acute coronary syndrome.  I also doubt this is an acute cerebrovascular accident as she is having fluctuating left-sided temporal throbbing pain which is more consistent with either tension headaches or migraine type headaches, either way it seems like a primary headache. ? ?Patient agreeable to the plan ? 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 ?Final diagnoses:  ?Chest pain, unspecified type  ?Nonintractable episodic headache, unspecified headache type  ? ? ?  ?Noemi Chapel, MD ?01/02/22 0800 ? ?

## 2022-01-01 NOTE — ED Provider Notes (Signed)
?[facility] ?Provider Note ? ? ?CSN: [id number] ?Arrival date & time: 01/01/22  1121 ? ?  ? ?History ? ?Chief Complaint  ?Patient presents with  ? Chest Pain  ? ? ?Elizabeth Mcintyre is a 81 y.o. female. ? ? ?Chest Pain ?Associated symptoms: headache   ?Associated symptoms: no abdominal pain, no cough, no dizziness, no fatigue, no fever, no numbness, no palpitations, no shortness of breath and no weakness   ? ?81 year old female presents to emergency department with 2-week history of left-sided headache and chest heaviness.  Headache is described as crescendo in onset and is relieved with rest.  Headache is associated with bilateral eye lacrimation.  She has a history of headaches and states that this feels no different.  Tylenol has helped with symptoms.  Chest tightness is described as occurring at rest when she is lying on her back.  Is not associated with movement or exacerbation physically.  Heaviness is relieved with time.  That is not associated with shortness of breath and does not radiate anywhere.  Upon further questioning, patient states she has a lot on her plate.  Children are pushing her to do more more activities, and she is under the stress of selling her house and moving.  She complained of diarrhea has been persistent since beginning chemo.  It is no worse or better in nature but persistent.  Patient denies shortness of breath, cough, abdominal pain, fever, chills, recent illness/known sick exposure.  ? ?Patient has a past medical history significant for GERD, marginal zone lymphoma, stage III kidney disease, major depressive disorder, hypertension ? ?Home Medications ?Prior to Admission medications   ?Medication Sig Start Date End Date Taking? Authorizing Provider  ?acetaminophen (TYLENOL) 325 MG tablet Take 325 mg by mouth every 6 (six) hours as needed for moderate pain.     [provider]  ?albuterol (VENTOLIN HFA) 108 (90 Base) MCG/ACT inhaler Inhale 2 puffs into the  lungs every 6 (six) hours as needed for wheezing. 08/14/20 08/14/21  Kuneff, Renee A, DO  ?amLODipine (NORVASC) 10 MG tablet Take 1 tablet (10 mg total) by mouth daily. 11/13/20   Kuneff, Renee A, DO  ?Ascorbic Acid (VITAMIN C) 100 MG tablet Take 100 mg by mouth daily.    [provider]  ?cetirizine (ZYRTEC) 10 MG tablet Take 10 mg by mouth at bedtime. As needed    [provider]  ?Cholecalciferol (VITAMIN D3) 25 MCG (1000 UT) CAPS Take 1,000 Units by mouth daily.     [provider]  ?donepezil (ARICEPT) 10 MG tablet Take 1 tablet daily 03/22/21   Cameron Sprang, MD  ?levothyroxine (SYNTHROID) 75 MCG tablet Take 1 tablet (75 mcg total) by mouth daily. 02/13/21   Lindell Spar, MD  ?lidocaine-prilocaine (EMLA) cream Apply 1 application topically as needed. ?Patient taking differently: Apply 1 application topically as needed (port access). 04/12/20   Orson Slick, MD  ?mirtazapine (REMERON) 7.5 MG tablet Take 1 tablet (7.5 mg total) by mouth at bedtime. 07/22/21   Lindell Spar, MD  ?pantoprazole (PROTONIX) 40 MG tablet Take 1 tablet (40 mg total) by mouth daily. 11/15/20 11/15/21  Eloise Harman, DO  ?potassium chloride SA (KLOR-CON) 20 MEQ tablet Take 1 tablet (20 mEq total) by mouth daily. 07/13/20   Orson Slick, MD  ?rOPINIRole (REQUIP) 0.25 MG tablet Take 1 tablet (0.25 mg total) by mouth at bedtime. 11/13/20   Kuneff, Renee A, DO  ?rosuvastatin ([place])  20 MG tablet Take 1 tablet (20 mg total) by mouth daily. 11/14/20   Kuneff, Renee A, DO  ?sucralfate (CARAFATE) 1 g tablet Take 1 g by mouth 4 (four) times daily -  with meals and at bedtime. As needed ?Patient not taking: Reported on 11/04/2021    [provider]  ?vitamin B-12 (CYANOCOBALAMIN) 1000 MCG tablet Take 1,000 mcg by mouth daily.     [provider]  ?   ? ?Allergies    ?Codeine phosphate   ? ?Review of Systems   ?Review of Systems  ?Constitutional:  Negative for chills, fatigue and fever.  ?HENT:   Negative for congestion, rhinorrhea, sinus pressure and sore throat.   ?Respiratory:  Negative for apnea, cough, choking, shortness of breath and wheezing.   ?Cardiovascular:  Positive for chest pain. Negative for palpitations and leg swelling.  ?Gastrointestinal:  Positive for diarrhea. Negative for abdominal pain, blood in stool, constipation and rectal pain.  ?     Persistent diarrhea since beginning chemo.  Has had a history of bowel resection.  ?Genitourinary:  Negative for dysuria, frequency, pelvic pain and urgency.  ?Skin:  Negative for pallor and wound.  ?Neurological:  Positive for headaches. Negative for dizziness, seizures, weakness, light-headedness and numbness.  ? ?Physical Exam ?Updated Vital Signs ?BP (!) 204/87 (BP Location: Left Arm)   Pulse 78   Temp 98 ?F (36.7 ?C) (Oral)   Resp 20   Ht '5\' 1"'$  (1.549 m)   Wt 55.8 kg   SpO2 98%   BMI 23.24 kg/m?  ?Physical Exam ?Vitals and nursing note reviewed.  ?Constitutional:   ?   General: She is not in acute distress. ?   Appearance: She is well-developed. She is not ill-appearing, toxic-appearing or diaphoretic.  ?HENT:  ?   Head: Normocephalic.  ?   Right Ear: Tympanic membrane, ear canal and external ear normal.  ?   Left Ear: Tympanic membrane, ear canal and external ear normal.  ?Eyes:  ?   Pupils: Pupils are equal, round, and reactive to light.  ?Cardiovascular:  ?   Rate and Rhythm: Normal rate and regular rhythm.  ?   Heart sounds: Normal heart sounds.  ?Pulmonary:  ?   Effort: Pulmonary effort is normal.  ?   Breath sounds: Normal breath sounds.  ?Abdominal:  ?   General: Bowel sounds are normal.  ?   Palpations: Abdomen is soft. There is no mass.  ?   Tenderness: There is no abdominal tenderness. There is no guarding.  ?Musculoskeletal:  ?   Cervical back: Normal range of motion and neck supple.  ?   Right lower leg: No edema.  ?   Left lower leg: No edema.  ?Skin: ?   General: Skin is warm and dry.  ?   Capillary Refill: Capillary refill  takes less than 2 seconds.  ?Neurological:  ?   General: No focal deficit present.  ?   Mental Status: She is alert and oriented to person, place, and time.  ?   Cranial Nerves: No cranial nerve deficit.  ?   Motor: No weakness, tremor or pronator drift.  ?   Coordination: Romberg sign negative.  ?   Comments: Cranial nerves III through XII grossly intact with no observable abnormality.  ?Psychiatric:     ?   Mood and Affect: Mood normal.     ?   Behavior: Behavior normal.     ?   Thought Content: Thought content normal.     ?  Judgment: Judgment normal.  ? ? ?ED Results / Procedures / Treatments   ?Labs ?(all labs ordered are listed, but only abnormal results are displayed) ?Labs Reviewed  ?CBC  ?BASIC METABOLIC PANEL  ?TROPONIN I (HIGH SENSITIVITY)  ? ? ?EKG ?EKG Interpretation ? ?Date/Time:  Wednesday January 01 2022 11:30:57 EDT ?Ventricular Rate:  86 ?PR Interval:  162 ?QRS Duration: 110 ?QT Interval:  400 ?QTC Calculation: 479 ?R Axis:   37 ?Text Interpretation: Sinus rhythm Borderline T abnormalities, anterior leads since last tracing no significant change Confirmed by Noemi Chapel [phone]) on 01/01/2022 11:32:43 AM ? ?Radiology ?No results found. ? ?Procedures ?Procedures  ? ? ?Medications Ordered in ED ?Medications - No data to display ? ?ED Course/ Medical Decision Making/ A&P ?  ?                        ?Medical Decision Making ?Amount and/or Complexity of Data Reviewed ?Labs: ordered. ?Radiology: ordered. ? ?This patient presents to the ED for concern of chest heaviness and left-sided headache, this involves an extensive number of treatment options, and is a complaint that carries with it a high risk of complications and morbidity.  The differential diagnosis includes ACS, PE, intracranial hemorrhage, aortic dissection, carotid artery occlusion, depression/mood regulatory disorder. ? ? ?Co morbidities that complicate the patient evaluation ? ?History of cancer ?Tangential historian. ? ? ?Additional history  obtained: ? ?Additional history obtained from EMR ?External records from outside source obtained and reviewed including appointments for non-intractable headaches as well as lymphoma appointment ? ? ?Lab Tests:

## 2022-01-01 NOTE — Discharge Instructions (Addendum)
Please continue to take blood pressure medication as it was elevated today when he came in.  Continue migraine management with Tylenol.  Follow-up with PCP recommended for further blood pressure and depression/mood regulation management.  Please return to emergency department if signs or symptoms of chest heaviness worsen or do not get better.   ?

## 2022-01-01 NOTE — ED Notes (Signed)
Fentanyl order was for a different pt; this RN performed override on this pt accidentally; medication was administered and charted on correct patient  ?

## 2022-01-14 ENCOUNTER — Other Ambulatory Visit: Payer: Self-pay

## 2022-01-14 ENCOUNTER — Ambulatory Visit
Admission: RE | Admit: 2022-01-14 | Discharge: 2022-01-14 | Disposition: A | Discharge status: Home / Self Care | Payer: Medicare HMO | Source: Ambulatory Visit | Attending: Hematology and Oncology | Admitting: Hematology and Oncology

## 2022-01-14 DIAGNOSIS — K6389 Other specified diseases of intestine: Secondary | ICD-10-CM | POA: Diagnosis not present

## 2022-01-14 DIAGNOSIS — I700 Atherosclerosis of aorta: Secondary | ICD-10-CM | POA: Diagnosis not present

## 2022-01-14 DIAGNOSIS — C8580 Other specified types of non-Hodgkin lymphoma, unspecified site: Secondary | ICD-10-CM | POA: Diagnosis not present

## 2022-01-14 DIAGNOSIS — Z9049 Acquired absence of other specified parts of digestive tract: Secondary | ICD-10-CM | POA: Diagnosis not present

## 2022-01-14 DIAGNOSIS — I2510 Atherosclerotic heart disease of native coronary artery without angina pectoris: Secondary | ICD-10-CM | POA: Diagnosis not present

## 2022-01-14 DIAGNOSIS — K3189 Other diseases of stomach and duodenum: Secondary | ICD-10-CM | POA: Diagnosis not present

## 2022-01-14 DIAGNOSIS — K5730 Diverticulosis of large intestine without perforation or abscess without bleeding: Secondary | ICD-10-CM | POA: Diagnosis not present

## 2022-01-14 DIAGNOSIS — Z8572 Personal history of non-Hodgkin lymphomas: Secondary | ICD-10-CM | POA: Diagnosis not present

## 2022-01-14 IMAGING — CT CT CHEST-ABD-PELV W/ CM
2 of 5 series · 13 of 36 positions shown, 15 images · IV contrast (agent unspecified)
Comparison: Multiple priors including most recent MAHOGANY

CLINICAL DATA: History of marginal zone lymphoma, follow-up.

* Tracking Code: BO *
EXAM:
CT CHEST, ABDOMEN, AND PELVIS WITH CONTRAST
TECHNIQUE: Multidetector CT imaging of the chest, abdomen and pelvis was
performed following the standard protocol during bolus
administration of intravenous contrast.

[Series 2: axials cap 5.00 · axial · 0.79mm/px · z∈[-1526,-981]mm · 10 of 135 slices shown, 12 images]
[im 13/135  mediastinal]
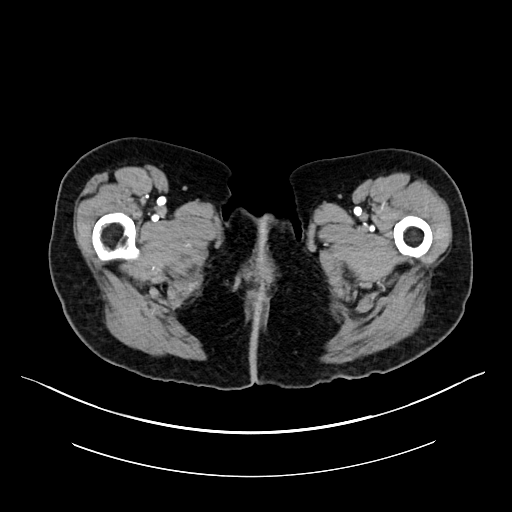
[im 13/135  bone]
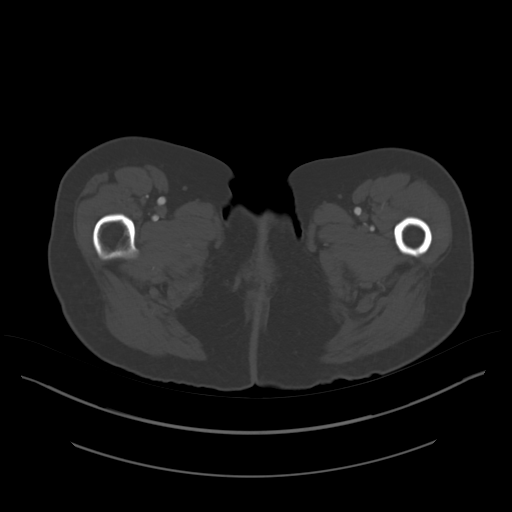
[im 25/135  mediastinal]
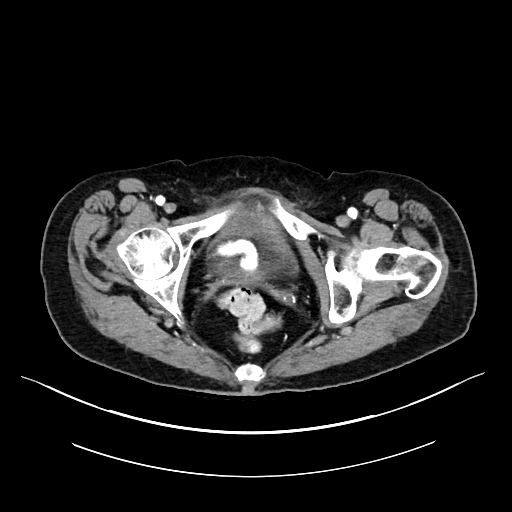
[im 37/135  mediastinal]
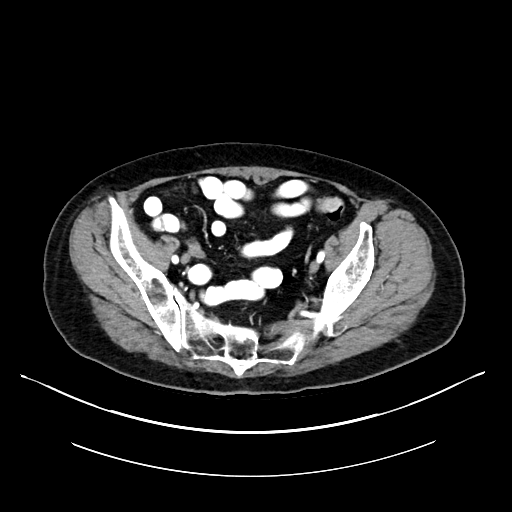
[im 49/135  mediastinal]
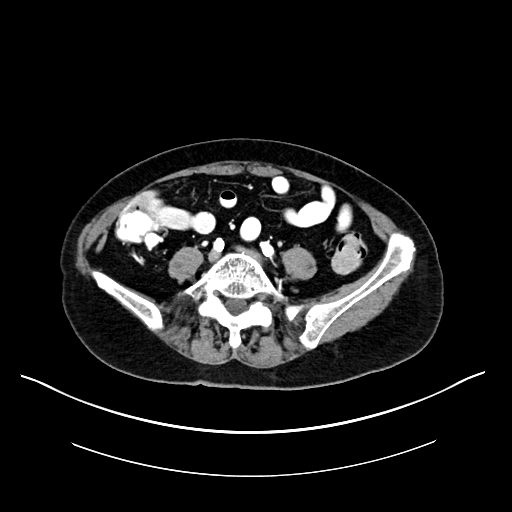
[im 61/135  mediastinal]
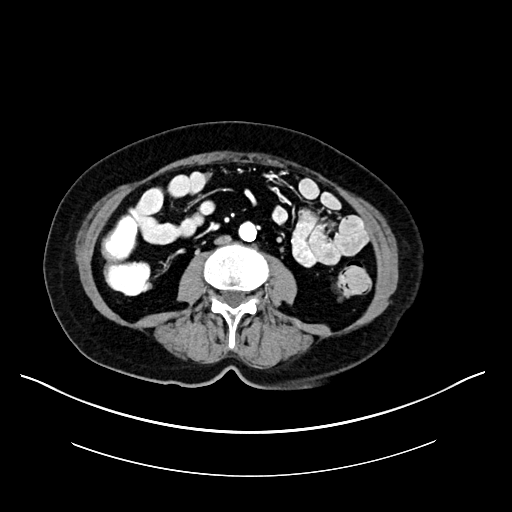
[im 74/135  mediastinal]
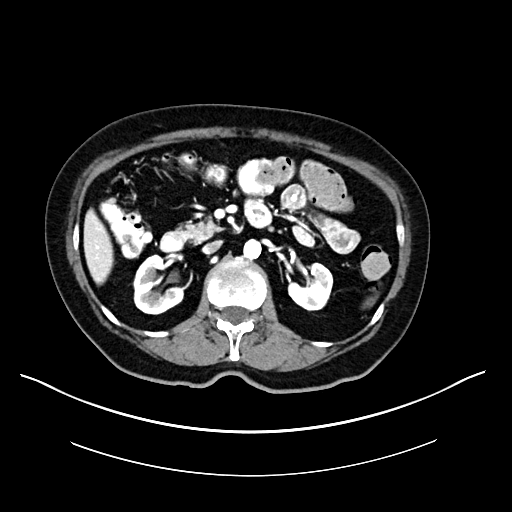
[im 86/135  mediastinal]
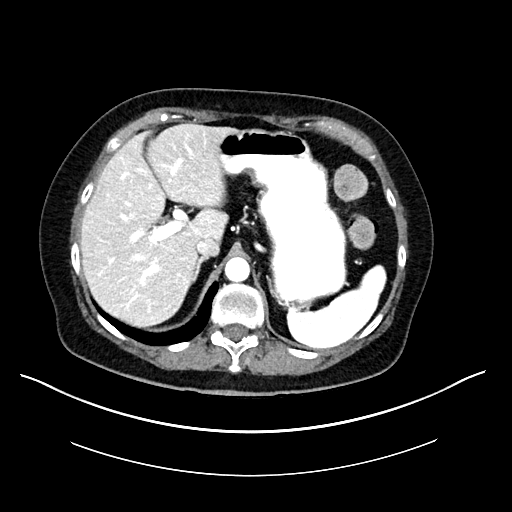
[im 98/135  mediastinal]
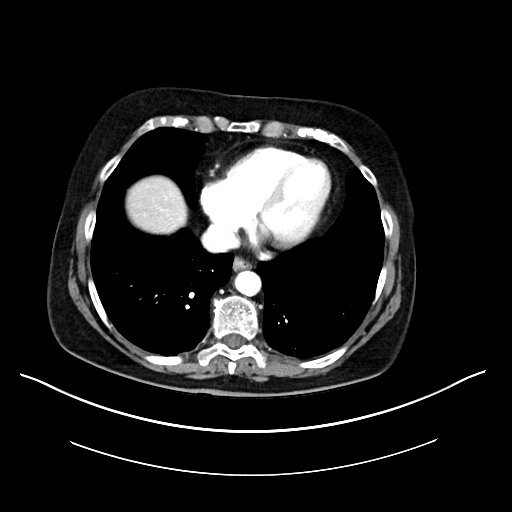
[im 110/135  mediastinal]
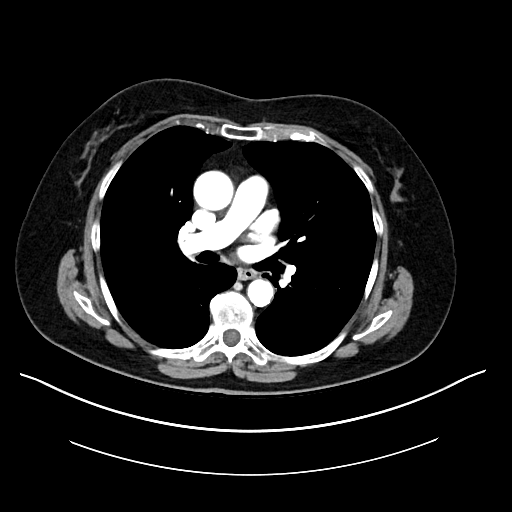
[im 110/135  bone]
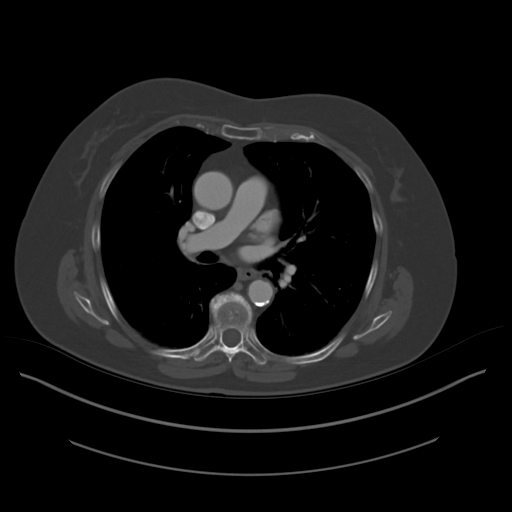
[im 122/135  mediastinal]
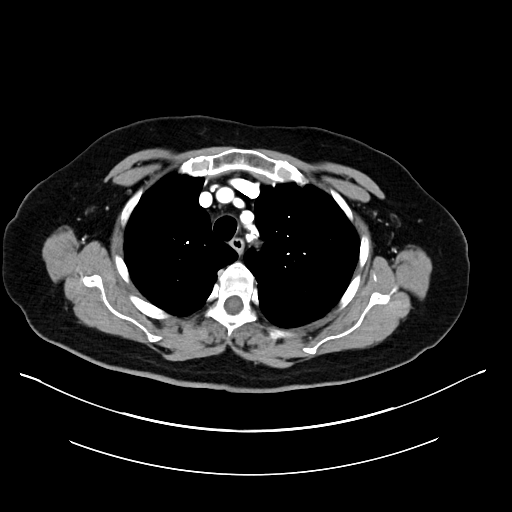

[Series 4: coronals cap 2.00 cor · coronal · 0.77mm/px · 3 of 129 slices shown]
[im 26/129  mediastinal]
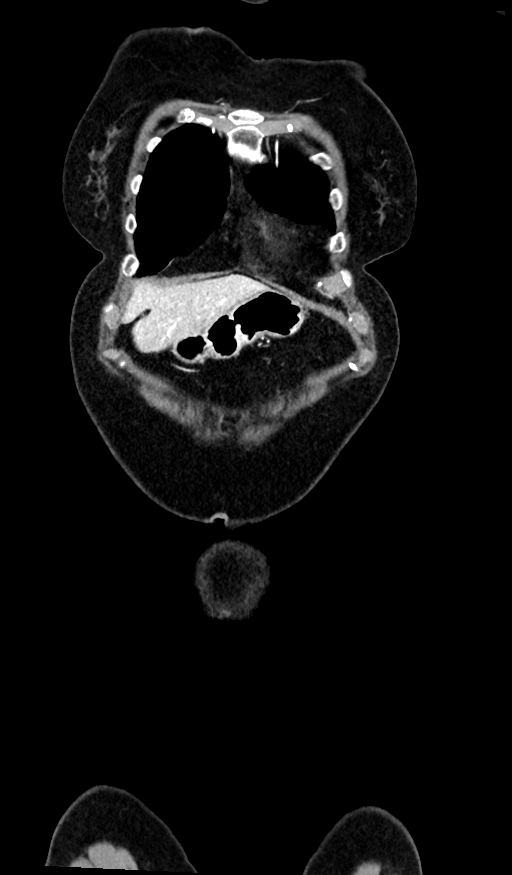
[im 52/129  mediastinal]
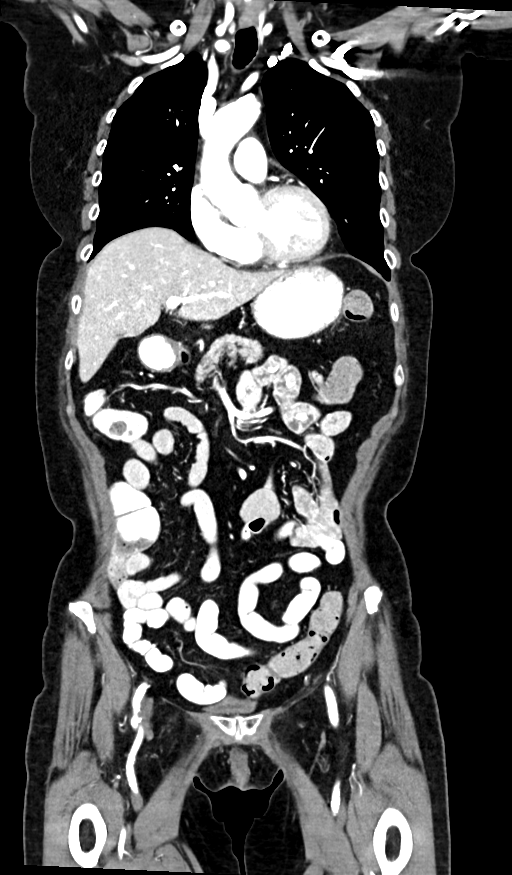
[im 77/129  mediastinal]
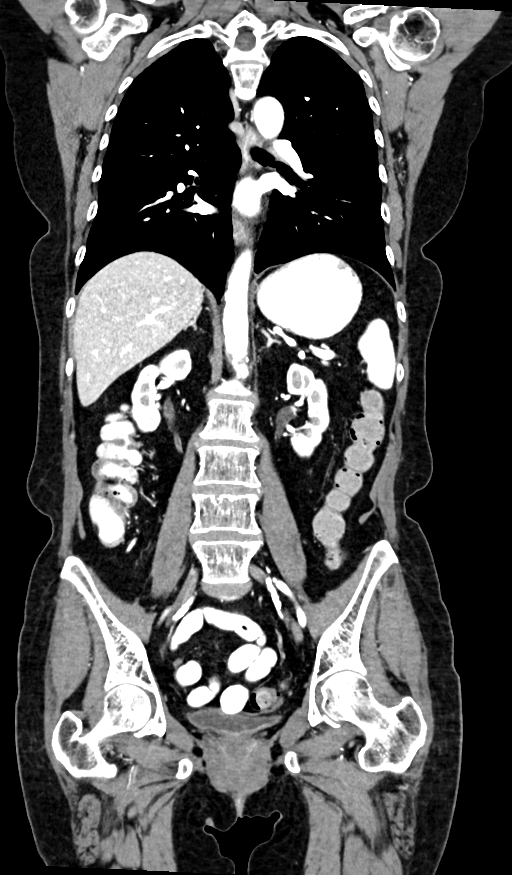

[13 of 36 positions shown; findings below may reference images not displayed]

RADIATION DOSE REDUCTION: This exam was performed according to the
departmental dose-optimization program which includes automated
exposure control, adjustment of the mA and/or kV according to
patient size and/or use of iterative reconstruction technique.

CONTRAST:  100mL OMNIPAQUE IOHEXOL 300 MG/ML  SOLN
FINDINGS: CT CHEST FINDINGS

Cardiovascular: Aortic and branch vessel atherosclerosis without
thoracic aortic aneurysm. No central pulmonary embolus on this
nondedicated study. Normal size heart. No significant pericardial
effusion/thickening. Coronary artery calcifications.

Mediastinum/Nodes: No supraclavicular adenopathy. No discrete
thyroid nodule. No pathologically enlarged mediastinal, hilar or
axillary lymph nodes.

Lungs/Pleura: No suspicious pulmonary nodules or masses. No pleural
effusion. No pneumothorax.

Musculoskeletal: Band of soft tissue in the anterior chest wall
reflects sequela of prior Port-A-Cath. No aggressive lytic or
blastic lesion of bone. No acute osseous abnormality.

CT ABDOMEN PELVIS FINDINGS

Hepatobiliary: No suspicious hepatic lesion. Gallbladder surgically
absent. No biliary ductal dilation.

Pancreas: No pancreatic ductal dilation or evidence of acute
inflammation.

Spleen: No splenomegaly or focal splenic lesion.

Adrenals/Urinary Tract: Bilateral adrenal glands appear normal. No
hydronephrosis. Kidneys demonstrate symmetric enhancement and
excretion of contrast material. Mild bilateral renal cortical
scarring. Urinary bladder is unremarkable for degree of distension.

Stomach/Bowel: Radiopaque enteric contrast material traverses the
hepatic flexure. Stomach is distended with ingested material and gas
without abnormal wall thickening. No pathologic dilation of large or
small bowel. The appendix and terminal ileum appear normal. No
evidence of acute bowel inflammation. Pancolonic diverticulosis
without findings of acute diverticulitis. Moderate volume of formed
stool throughout the colon suggestive of constipation.

Vascular/Lymphatic: Aortic and branch vessel atherosclerosis without
abdominal aortic aneurysm.

No gastrohepatic or hepatoduodenal ligament lymphadenopathy. No
retroperitoneal or mesenteric lymphadenopathy. No pelvic sidewall
lymphadenopathy. No groin lymphadenopathy.

Reproductive: Status post hysterectomy. No adnexal masses.

Other: No significant abdominopelvic free fluid.

Musculoskeletal: No aggressive lytic or blastic lesion of bone.
Multilevel degenerative changes spine.
IMPRESSION: 1. No lymphadenopathy within the chest, abdomen, or pelvis and no
splenomegaly.
2. Pancolonic diverticulosis without findings of acute
diverticulitis.
3. Moderate volume of formed stool throughout the colon suggestive
of constipation.
4.  Aortic Atherosclerosis ([35]-[35]).

## 2022-01-14 MED ORDER — IOHEXOL 300 MG/ML  SOLN
100.0000 mL | Freq: Once | INTRAMUSCULAR | Status: AC | PRN
  Administered 2022-01-14: 100 mL via INTRAVENOUS

## 2022-01-15 ENCOUNTER — Telehealth: Payer: Self-pay | Admitting: *Deleted

## 2022-01-15 NOTE — Telephone Encounter (Signed)
TCT patient's daughter, Brendia Sacks. Spoke with her and advised that her mom's CT scans looked good-no new or recurrent lymphoma. [name] is pleased with results and will let her mom know. She is aware of the appts here on 01/31/22. ?

## 2022-01-15 NOTE — Telephone Encounter (Signed)
-----   Message from Orson Slick, MD sent at 01/15/2022  8:41 AM EDT ----- ?Please let Mrs. Mcilrath know that her CT scan showed no evidence of new or recurrent lymphoma. We will plan to see her back later this month on 01/31/2022.  ? ?----- Message ----- ?From: Interface, Rad Results In ?Sent: 01/14/2022   3:46 PM EDT ?To: Orson Slick, MD ? ? ?

## 2022-01-29 ENCOUNTER — Other Ambulatory Visit: Payer: Self-pay

## 2022-01-29 DIAGNOSIS — C8580 Other specified types of non-Hodgkin lymphoma, unspecified site: Secondary | ICD-10-CM

## 2022-01-31 ENCOUNTER — Other Ambulatory Visit: Payer: Self-pay

## 2022-01-31 ENCOUNTER — Inpatient Hospital Stay: Payer: Medicare HMO | Attending: Hematology and Oncology

## 2022-01-31 ENCOUNTER — Inpatient Hospital Stay: Payer: Medicare HMO | Admitting: Hematology and Oncology

## 2022-01-31 VITALS — BP 138/66 | HR 78 | Temp 97.9°F | Resp 17 | Wt 126.3 lb

## 2022-01-31 DIAGNOSIS — C8580 Other specified types of non-Hodgkin lymphoma, unspecified site: Secondary | ICD-10-CM | POA: Diagnosis not present

## 2022-01-31 DIAGNOSIS — C8514 Unspecified B-cell lymphoma, lymph nodes of axilla and upper limb: Secondary | ICD-10-CM | POA: Insufficient documentation

## 2022-01-31 DIAGNOSIS — Z95828 Presence of other vascular implants and grafts: Secondary | ICD-10-CM

## 2022-01-31 DIAGNOSIS — Z79899 Other long term (current) drug therapy: Secondary | ICD-10-CM | POA: Insufficient documentation

## 2022-01-31 LAB — CMP (CANCER CENTER ONLY)
ALT: 15 U/L (ref 0–44)
AST: 22 U/L (ref 15–41)
Albumin: 4.5 g/dL (ref 3.5–5.0)
Alkaline Phosphatase: 80 U/L (ref 38–126)
Anion gap: 8 (ref 5–15)
BUN: 30 mg/dL — ABNORMAL HIGH (ref 8–23)
CO2: 28 mmol/L (ref 22–32)
Calcium: 9.6 mg/dL (ref 8.9–10.3)
Chloride: 105 mmol/L (ref 98–111)
Creatinine: 1.46 mg/dL — ABNORMAL HIGH (ref 0.44–1.00)
GFR, Estimated: 36 mL/min — ABNORMAL LOW (ref >60)
Glucose, Bld: 102 mg/dL — ABNORMAL HIGH (ref 70–99)
Potassium: 4.4 mmol/L (ref 3.5–5.1)
Sodium: 141 mmol/L (ref 135–145)
Total Bilirubin: 0.9 mg/dL (ref 0.3–1.2)
Total Protein: 7.4 g/dL (ref 6.5–8.1)

## 2022-01-31 LAB — CBC WITH DIFFERENTIAL (CANCER CENTER ONLY)
Abs Immature Granulocytes: 0.04 10*3/uL (ref 0.00–0.07)
Basophils Absolute: 0.1 10*3/uL (ref 0.0–0.1)
Basophils Relative: 1 %
Eosinophils Absolute: 0.4 10*3/uL (ref 0.0–0.5)
Eosinophils Relative: 4 %
HCT: 40.8 % (ref 36.0–46.0)
Hemoglobin: 14.3 g/dL (ref 12.0–15.0)
Immature Granulocytes: 0 %
Lymphocytes Relative: 31 %
Lymphs Abs: 3 10*3/uL (ref 0.7–4.0)
MCH: 30.3 pg (ref 26.0–34.0)
MCHC: 35 g/dL (ref 30.0–36.0)
MCV: 86.4 fL (ref 80.0–100.0)
Monocytes Absolute: 0.6 10*3/uL (ref 0.1–1.0)
Monocytes Relative: 6 %
Neutro Abs: 5.5 10*3/uL (ref 1.7–7.7)
Neutrophils Relative %: 58 %
Platelet Count: 211 10*3/uL (ref 150–400)
RBC: 4.72 MIL/uL (ref 3.87–5.11)
RDW: 12.6 % (ref 11.5–15.5)
WBC Count: 9.5 10*3/uL (ref 4.0–10.5)
nRBC: 0 % (ref 0.0–0.2)

## 2022-01-31 LAB — LACTATE DEHYDROGENASE: LDH: 138 U/L (ref 98–192)

## 2022-01-31 NOTE — Progress Notes (Signed)
[place] [phone]   [phone]  PROGRESS NOTE  Patient Care Team: Lindell Spar, MD as PCP - General (Internal Medicine) Danie Binder, MD (Inactive) as Consulting Physician (Gastroenterology) Minus Breeding, MD as Consulting Physician (Cardiology) Annitta Needs, NP (Gastroenterology) Carlis Stable, NP as Nurse Practitioner (Gastroenterology) Montez Morita, Quillian Quince, MD as Consulting Physician (Gastroenterology) Eloise Harman, DO as Consulting Physician (Internal Medicine) Cameron Sprang, MD as Consulting Physician (Neurology)  Hematological/Oncological History  # Low Grade Marginal Zone Lymphoma, Stage III 1) 06/06/2019: patient underwent a diagnostic mammogram of the left breast. Calcifications noted in the left breast, recommended 6 month f/u imaging. 2) 02/02/2020: bilateral diagnostic mammogram showed abnormal enlarged lymph nodes with cortical thickening. The largest of these measures 2.2 x 1.7 x 2.0 cm.  3) 02/02/2020: US guided biopsy of left axillary lymph nodes performed. 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 The overall features are atypical and highly suspicious for non-Hodgkin B-cell lymphoma, particularly marginal zone lymphoma. 4) 02/16/2020: establish care with Dr. Lorenso Courier  5) 02/29/2020: PET CT scan performed, showed hypermetabolic lymph nodes identified in the posterior left neck, both axillary/subpectoral regions, mediastinum, hila, right external iliac chain, and right groin. Massive splenomegaly of 17.8 cm noted as well.  6) 03/20/2020: excisional biopsy of left posterior cervical lymph node. Biopsy confirmed most likely low grade marginal zone lymphoma.   7) 7/14-7/15/2021: Cycle 1 Day 1 of Rituximab/Bendamycin 8) 03/30/2020-04/01/2020: admitted inpatient for hematemesis. Noted to have marked esophagitis on EGD.  9) 04/25/2020: IV feraheme '510mg'$   administered 10) 04/26/2020: Cycle 2 Day 1 of Rituximab/Bendamycin 11) 05/10/2020: HOLD R-Benda in setting of colitis, GI bleed, and intolerance to therapy. Plan to start monotherapy Rituximab once patient has rebounded.  12) 07/06/2020: Cycle 1 Week 1 of monotherapy rituximab 13) 07/30/2020: Cycle 1 Week 3 of monotherapy rituximab 14) 08/06/2020: Completed Cycle 1 of monotherapy rituximab 15)  09/12/2020: PET CT scan shows completed resolution of tumor and FDG avid lesions. Deauville Score 1. Complete response.  16) 11/16/2020: Cycle 1 Day 1 of Maintenance Rituximab  17) 01/10/2021:  Cycle 2 Day 1 of Maintenance Rituximab  18) 03/07/2021: Cycle 3 Day 1 of Maintenance Rituximab  19) 05/09/2021: Cycle 4 Day 1 of Maintenance Rituximab  20) 08/12/2021: PET CT scan showed no evidence of residual recurrent disease.  Interval History:  Elizabeth Mcintyre 81 y.o. 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   On exam today, Elizabeth Mcintyre reports she has been well in the interim since her last visit.  She reports her hair is coming back quite thick.  She notes her energy levels are good and she currently ranks them as an 8 out of 10.  She reports her appetite is "poor" because "nothing tastes good".  She notes that her daughter and grandson are currently out in the car and that is why they are not here with her today.  Her weight has been steady and she has been having no other concerning symptoms in the interim since her last visit.  She reports that she "feels depressed sometimes".  As she recently sold a house of hers.  She denies any fevers, chills, night sweats, shortness of breath, chest pain, cough, neuropathy or skin changes. The rest of the 10 point ROS is negative.    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   Lower GI bleeding 04/30/2020   Lymphadenopathy 01/23/2020   Osteopenia 2017   Last  bone density 05/04/2017: -2.4   PONV (postoperative nausea and vomiting)    Schatzki's ring    Stress incontinence    Stroke ([place])    mini stroke - found on a CT scan    SURGICAL HISTORY: Past Surgical History:  Procedure Laterality Date   ABDOMINAL HYSTERECTOMY     BALLOON DILATION N/A 08/21/2020   Procedure: BALLOON DILATION;  Surgeon: Eloise Harman, DO;  [facility];  Service: Endoscopy;  Laterality: N/A;   BIOPSY  03/30/2020   Procedure: BIOPSY;  Surgeon: Ronald Lobo, MD;  Location: [facility];  Service: Endoscopy;;   BIOPSY  05/02/2020   Procedure: BIOPSY;  Surgeon: Montez Morita, Quillian Quince, MD;  Location: [facility];  Service: Gastroenterology;;   CARDIAC CATHETERIZATION     X 2, last one in [place] 04/13/2017   Procedure: LAPAROSCOPIC CHOLECYSTECTOMY;  Surgeon: Aviva Signs, MD;  Location: [facility];  Service: General;  Laterality: N/A;   COLONOSCOPY     COLONOSCOPY  May 2012   Dr. Olevia Perches: mild diverticulosis, otherwise normal.    COLONOSCOPY WITH PROPOFOL N/A 05/02/2020   Dr. Jenetta Downer: 8 mm polyp removed from the ascending colon, 2 mm polyp removed from the ascending colon.  Tubular adenomas.  Diverticulosis.  Mucosal ulceration in the sigmoid colon noted, pathology consistent with ischemic colitis.    ESOPHAGOGASTRODUODENOSCOPY N/A 01/28/2015   Dr. Gala Romney: reflux esophagitis, Schatzki's ring not manipulated due to recent bleeding   ESOPHAGOGASTRODUODENOSCOPY N/A 03/30/2015   Dr. Gala Romney: Schatzki's ring s/p Venia Minks dilation, previously noted esophageal ulcer completely healed   ESOPHAGOGASTRODUODENOSCOPY N/A 03/30/2020   Buccini: Moderately severe erosive, circumferential, confluent esophagitis with no bleeding found 25 to 40 cm from incisors.  Nonobstructing and mild Schatzki ring, there were also multiple distal esophageal rings noted, minimal hiatal hernia.   ESOPHAGOGASTRODUODENOSCOPY (EGD) WITH PROPOFOL N/A 08/21/2020   Procedure: ESOPHAGOGASTRODUODENOSCOPY (EGD) WITH PROPOFOL;  Surgeon: Eloise Harman, DO;  [facility];  Service: Endoscopy;  Laterality: N/A;  2:15pm   IR IMAGING GUIDED PORT INSERTION  03/13/2020   IR REMOVAL TUN ACCESS W/ PORT W/O FL MOD SED  10/01/2021   LYMPH NODE BIOPSY Left 03/20/2020   Procedure: LEFT POSTERIOR CERVICAL LYMPH NODE BIOPSY;  Surgeon: Georganna Skeans, MD;  Location: [facility];  Service: General;  Laterality: Left;   MALONEY DILATION N/A 03/30/2015   Procedure: Keturah Shavers;  Surgeon: Daneil Dolin, MD;  [facility];  Service: Endoscopy;  Laterality: N/A;   POLYPECTOMY  05/02/2020   Procedure: POLYPECTOMY;  Surgeon: Montez Morita,  Quillian Quince, MD;  [facility];  Service: Gastroenterology;;    SOCIAL HISTORY: Social History   Socioeconomic History   Marital status: Widowed    Spouse name: Not on file   Number of children: 2   Years of education: Not on file   Highest education level: Not on file  Occupational History   Occupation: retired  Tobacco Use   Smoking status: Never   Smokeless tobacco: Never  Vaping Use   Vaping Use: Never used  Substance and Sexual Activity   Alcohol use: No   Drug use: No   Sexual activity: Never  Other Topics Concern   Not on file  Social History Narrative   Ms. Masri is  widowed. Her young grandson lives with her, for whom she shares custody with her daughter, the son's aunt.     Right handed    Social Determinants of Health   Financial Resource Strain: Low Risk    Difficulty of Paying Living Expenses: Not hard at all  Food Insecurity: No Food Insecurity   Worried About Charity fundraiser in the Last Year: Never true   Ran Out of Food in the Last Year: Never true  Transportation Needs: No Transportation Needs   Lack of Transportation (Medical): No   Lack of Transportation (Non-Medical): No  Physical Activity: Insufficiently Active   Days of Exercise per Week: 5 days   Minutes of Exercise per Session: 10 min  Stress: No Stress Concern Present   Feeling of Stress : Only a little  Social Connections: Socially Isolated   Frequency of Communication with Friends and Family: More than three times a week   Frequency of Social Gatherings with Friends and Family: More than three times a week   Attends Religious Services: Never   Marine scientist or Organizations: No   Attends Archivist Meetings: Never   Marital Status: Widowed  Human resources officer Violence: Not At Risk   Fear of Current or Ex-Partner: No   Emotionally Abused: No   Physically Abused: No   Sexually Abused: No    FAMILY HISTORY: Family History  Problem Relation Age of Onset   Heart disease Mother    Osteoarthritis Mother    Sudden death Father    Single kidney Father    Other Father        h/o severe MVA injuries   Hyperlipidemia Sister    Other Daughter        Myalgias   Fibromyalgia Daughter    Allergies Daughter    Heart disease Maternal Grandfather    Sudden death Paternal Grandmother    Diabetes Paternal Grandfather    Heart disease Daughter    Other Daughter        palpitations   Pulmonary fibrosis Maternal Aunt    Cancer Paternal Uncle    Pulmonary fibrosis Maternal Aunt    Colon cancer Neg Hx     ALLERGIES:  is allergic to codeine  phosphate.  MEDICATIONS:  Current Outpatient Medications  Medication Sig Dispense Refill   acetaminophen (TYLENOL) 325 MG tablet Take 325 mg by mouth every 6 (six) hours as needed for moderate pain.      albuterol (VENTOLIN HFA) 108 (90 Base) MCG/ACT inhaler Inhale 2 puffs into the lungs every 6 (six) hours as needed for wheezing. 8.5 g 2   amLODipine (NORVASC) 10 MG tablet Take 1 tablet (10 mg total) by mouth daily. 90 tablet 1   Ascorbic Acid (VITAMIN C) 100  MG tablet Take 100 mg by mouth daily.     cetirizine (ZYRTEC) 10 MG tablet Take 10 mg by mouth at bedtime. As needed     Cholecalciferol (VITAMIN D3) 25 MCG (1000 UT) CAPS Take 1,000 Units by mouth daily.      donepezil (ARICEPT) 10 MG tablet Take 1 tablet daily 30 tablet 11   levothyroxine (SYNTHROID) 75 MCG tablet Take 1 tablet (75 mcg total) by mouth daily. 30 tablet 3   lidocaine-prilocaine (EMLA) cream Apply 1 application topically as needed. (Patient taking differently: Apply 1 application topically as needed (port access).) 30 g 1   mirtazapine (REMERON) 7.5 MG tablet Take 1 tablet (7.5 mg total) by mouth at bedtime. 30 tablet 3   pantoprazole (PROTONIX) 40 MG tablet Take 1 tablet (40 mg total) by mouth daily. 90 tablet 3   potassium chloride SA (KLOR-CON) 20 MEQ tablet Take 1 tablet (20 mEq total) by mouth daily. 21 tablet 0   rOPINIRole (REQUIP) 0.25 MG tablet Take 1 tablet (0.25 mg total) by mouth at bedtime. 90 tablet 1   rosuvastatin (CRESTOR) 20 MG tablet Take 1 tablet (20 mg total) by mouth daily. 90 tablet 3   sucralfate (CARAFATE) 1 g tablet Take 1 g by mouth 4 (four) times daily -  with meals and at bedtime. As needed (Patient not taking: Reported on 11/04/2021)     vitamin B-12 (CYANOCOBALAMIN) 1000 MCG tablet Take 1,000 mcg by mouth daily.      No current facility-administered medications for this visit.    REVIEW OF SYSTEMS:   Constitutional: ( - ) fevers, ( - )  chills , ( - ) night sweats Eyes: ( - ) blurriness  of vision, ( - ) double vision, ( - ) watery eyes Ears, nose, mouth, throat, and face: ( - ) mucositis, ( - ) sore throat Respiratory: ( - ) cough, ( - ) dyspnea, ( - ) wheezes Cardiovascular: ( - ) palpitation, ( - ) chest discomfort, ( - ) lower extremity swelling Gastrointestinal:  ( - ) nausea, ( - ) heartburn, ( - ) change in bowel habits Skin: ( - ) abnormal skin rashes Lymphatics: ( - ) new lymphadenopathy, ( - ) easy bruising Neurological: ( - ) numbness, ( - ) tingling, ( - ) new weaknesses Behavioral/Psych: ( - ) mood change, ( - ) new changes  All other systems were reviewed with the patient and are negative.  PHYSICAL EXAMINATION: ECOG PERFORMANCE STATUS: 1 - Symptomatic but completely ambulatory  Vitals:   01/31/22 1408  BP: 138/66  Pulse: 78  Resp: 17  Temp: 97.9 F (36.6 C)  SpO2: 98%    Filed Weights   01/31/22 1408  Weight: 126 lb 4.8 oz (57.3 kg)   GENERAL: well appearing elderly Caucasian female in NAD  SKIN: skin color, texture, turgor are normal. EYES: conjunctiva are pink and non-injected, sclera clear NECK: supple, non-tender.  LYMPH: no cervical, supraclavicular, or axillary lymph nodes palpated.  LUNGS: clear to auscultation and percussion with normal breathing effort HEART: regular rate & rhythm and no murmurs and no lower extremity edema Musculoskeletal: no cyanosis of digits and no clubbing  PSYCH: alert & oriented x 3, fluent speech NEURO: no focal motor/sensory deficits  LABORATORY DATA:  I have reviewed the data as listed    Latest Ref Rng & Units 01/31/2022    1:49 PM 01/01/2022   11:41 AM 11/04/2021   11:04 AM  CBC  WBC 4.0 -  10.5 K/uL 9.5   7.7   6.7    Hemoglobin 12.0 - 15.0 g/dL 14.3   14.1   13.7    Hematocrit 36.0 - 46.0 % 40.8   40.9   39.6    Platelets 150 - 400 K/uL 211   186   189         Latest Ref Rng & Units 01/31/2022    1:49 PM 01/01/2022   11:41 AM 11/04/2021   11:04 AM  CMP  Glucose 70 - 99 mg/dL 102   146   110     BUN 8 - 23 mg/dL '30   23   26    '$ Creatinine 0.44 - 1.00 mg/dL 1.46   1.14   1.48    Sodium 135 - 145 mmol/L 141   138   142    Potassium 3.5 - 5.1 mmol/L 4.4   3.3   4.2    Chloride 98 - 111 mmol/L 105   103   106    CO2 22 - 32 mmol/L '28   26   29    '$ Calcium 8.9 - 10.3 mg/dL 9.6   9.6   9.8    Total Protein 6.5 - 8.1 g/dL 7.4    6.9    Total Bilirubin 0.3 - 1.2 mg/dL 0.9    0.6    Alkaline Phos 38 - 126 U/L 80    90    AST 15 - 41 U/L 22    17    ALT 0 - 44 U/L 15    10      RADIOGRAPHIC STUDIES: CT CHEST ABDOMEN PELVIS W CONTRAST  Result Date: 01/14/2022 CLINICAL DATA:  History of marginal zone lymphoma, follow-up. * Tracking Code: BO * EXAM: CT CHEST, ABDOMEN, AND PELVIS WITH CONTRAST TECHNIQUE: Multidetector CT imaging of the chest, abdomen and pelvis was performed following the standard protocol during bolus administration of intravenous contrast. RADIATION DOSE REDUCTION: This exam was performed according to the departmental dose-optimization program which includes automated exposure control, adjustment of the mA and/or kV according to patient size and/or use of iterative reconstruction technique. CONTRAST:  182m OMNIPAQUE IOHEXOL 300 MG/ML  SOLN COMPARISON:  Multiple priors including most recent PET-CT August 12, 2021 FINDINGS: CT CHEST FINDINGS Cardiovascular: Aortic and branch vessel atherosclerosis without thoracic aortic aneurysm. No central pulmonary embolus on this nondedicated study. Normal size heart. No significant pericardial effusion/thickening. Coronary artery calcifications. Mediastinum/Nodes: No supraclavicular adenopathy. No discrete thyroid nodule. No pathologically enlarged mediastinal, hilar or axillary lymph nodes. Lungs/Pleura: No suspicious pulmonary nodules or masses. No pleural effusion. No pneumothorax. Musculoskeletal: Band of soft tissue in the anterior chest wall reflects sequela of prior Port-A-Cath. No aggressive lytic or blastic lesion of bone. No acute  osseous abnormality. CT ABDOMEN PELVIS FINDINGS Hepatobiliary: No suspicious hepatic lesion. Gallbladder surgically absent. No biliary ductal dilation. Pancreas: No pancreatic ductal dilation or evidence of acute inflammation. Spleen: No splenomegaly or focal splenic lesion. Adrenals/Urinary Tract: Bilateral adrenal glands appear normal. No hydronephrosis. Kidneys demonstrate symmetric enhancement and excretion of contrast material. Mild bilateral renal cortical scarring. Urinary bladder is unremarkable for degree of distension. Stomach/Bowel: Radiopaque enteric contrast material traverses the hepatic flexure. Stomach is distended with ingested material and gas without abnormal wall thickening. No pathologic dilation of large or small bowel. The appendix and terminal ileum appear normal. No evidence of acute bowel inflammation. Pancolonic diverticulosis without findings of acute diverticulitis. Moderate volume of formed stool throughout the colon suggestive of constipation.  Vascular/Lymphatic: Aortic and branch vessel atherosclerosis without abdominal aortic aneurysm. No gastrohepatic or hepatoduodenal ligament lymphadenopathy. No retroperitoneal or mesenteric lymphadenopathy. No pelvic sidewall lymphadenopathy. No groin lymphadenopathy. Reproductive: Status post hysterectomy. No adnexal masses. Other: No significant abdominopelvic free fluid. Musculoskeletal: No aggressive lytic or blastic lesion of bone. Multilevel degenerative changes spine. IMPRESSION: 1. No lymphadenopathy within the chest, abdomen, or pelvis and no splenomegaly. 2. Pancolonic diverticulosis without findings of acute diverticulitis. 3. Moderate volume of formed stool throughout the colon suggestive of constipation. 4.  Aortic Atherosclerosis (ICD10-I70.0). Electronically Signed   By: Dahlia Bailiff M.D.   On: 01/14/2022 15:44    ASSESSMENT & PLAN ANGELLY SPEARING 81 y.o. female with medical history significant for low grade marginal zone  lymphoma stage IIIA who presents for a follow up visit.  Previously I recommend a rituximab monotherapy given her intolerance of BR x 2 cycles. This regimen consisted of rituximab 375 mg/m2 IV q weekly x 4 weeks. During her scans on 8/16 - 05/03/2020 she was shown to have marked response to therapy with normalization of spleen size and resolution of lymphadenopathy. Repeat PET CT scan on 09/12/20 after completed of rituximab monotherapy showed a completed response. We will proceed with q 2 months maintenance/consolidation rituximab x 4 cycles.   GELF Criteria: 1 (splenomegaly). Indication for treatment  # Low Grade Marginal Zone Lymphoma, Stage III -- discontinued bendamustine + rituximab as patient is unable to tolerate this treatment. Will pursue rituximab monotherapy instead. Started therapy with Cycle 1 Day 1 on 03/28/2020 BR, decreased to Ritux alone q weekly x 4 starting 07/06/2020. --patient completed excisional lymph node biopsy to confirm the diagnosis. Pathological exam of the lymph node confirms marginal zone lymphoma.  --PET CT scan confirms stage III disease. Patient meets GELF criteria for treatment (splenomegaly). She did not have any B symptoms and her counts were stable at diagnosis, with some mild thrombocytopenia.  --Patient completed monotherapy rituximab x 4 weeks on 08/06/2020.  --post treatment PET CT scan from 09/12/20 showed Deauville Score 1, complete response to therapy. Recommend to proceed with q 8 week rituximab '375mg'$ /m2 maintenance therapy. This was continued for 4 doses --Patient started C1D1 of maintenance Rituximab on 11/16/2020.  --Patient completed 4 cycles of maintenance Rituximab.  Plan: -- Labs today show white blood cell count 9.5, hemoglobin 14.3, MCV 86.4, and platelets 211 -- PET scan performed on 08/13/2021 showed no evidence of residual disease.  CT scan in May 2022 showed no evidence of recurrent disease/relapse.  Because that scan shows no evidence of  residual recurrent disease no further scans are recommended. --Return to the clinic in 6 months for continued monitoring.  No orders of the defined types were placed in this encounter.  All questions were answered. The patient knows to call the clinic with any problems, questions or concerns.  A total of more than 30 minutes were spent on this encounter and over half of that time was spent on counseling and coordination of care as outlined above.   Ledell Peoples, MD Department of Hematology/Oncology [place] [facility] Phone: [phone] Pager: [phone] Email: [email or web address]  02/09/2022 6:58 PM   Literature Support:  NCCN Guidelines: Consolidation with rituximab should be offered if initially treated with single agent rituximab. (MS-63)  UptoDate: Different schedules have been used. The following administration schedules were used in the randomized trials and are equally acceptable approaches:  ?Rituximab 375 mg/m2 intravenously per week for a total of four doses  ?Rituximab  375 mg/m2 intravenously per week for four weeks, followed by four additional doses administered every two months

## 2022-02-09 ENCOUNTER — Encounter: Payer: Self-pay | Admitting: Hematology and Oncology

## 2022-02-11 ENCOUNTER — Encounter: Payer: Self-pay | Admitting: Physician Assistant

## 2022-02-11 ENCOUNTER — Ambulatory Visit: Payer: Medicare HMO | Admitting: Physician Assistant

## 2022-02-11 VITALS — BP 173/78 | HR 62 | Resp 18 | Wt 127.0 lb

## 2022-02-11 DIAGNOSIS — G3109 Other frontotemporal dementia: Secondary | ICD-10-CM

## 2022-02-11 DIAGNOSIS — F0280 Dementia in other diseases classified elsewhere without behavioral disturbance: Secondary | ICD-10-CM

## 2022-02-11 NOTE — Patient Instructions (Addendum)
It was a pleasure to see you today at our office.   Recommendations:  Follow up in  6 months Repeat neurocognitive testing  Continue donepezil 10 mg daily. Side effects were discussed  Continue the mood medications as per primary doctor   Whom to call:  Memory  decline, memory medications: Call out office [phone]   For psychiatric meds, mood meds: Please have your primary care physician manage these medications.   Counseling regarding caregiver distress, including caregiver depression, anxiety and issues regarding community resources, adult day care programs, adult living facilities, or memory care questions:   Feel free to contact Misty Taylor Palladino, Social Worker at [phone]   For assessment of decision of mental capacity and competency:  Call Dr. Michelle Haber, geriatric psychiatrist at [phone]  For guidance in geriatric dementia issues please call [facility] [phone]  For guidance regarding [facility] and if placement were needed at the facility, contact Nicole Reynolds, Social Worker tel: [phone]  If you have any severe symptoms of a stroke, or other severe issues such as confusion,severe chills or fever, etc call 911 or go to the ER as you may need to be evaluate further  Consider [facility]  [address] [place], [place] [address] [phone]  Hours of Operation Mondays to Thursdays: 8 am to 8 pm,Fridays: 9 am to 8 pm, Saturdays: 9 am to 1 pm Sundays: Closed  [email or web address]    Feel free to visit Facebook page " [email or web address]" for tips of how to care for people with memory problems.         RECOMMENDATIONS FOR ALL PATIENTS WITH MEMORY PROBLEMS: 1. Continue to exercise (Recommend 30 minutes of walking everyday, or 3 hours every week) 2. Increase social interactions - continue going to Church and enjoy social gatherings  with friends and family 3. Eat healthy, avoid fried foods and eat more fruits and vegetables 4. Maintain adequate blood pressure, blood sugar, and blood cholesterol level. Reducing the risk of stroke and cardiovascular disease also helps promoting better memory. 5. Avoid stressful situations. Live a simple life and avoid aggravations. Organize your time and prepare for the next day in anticipation. 6. Sleep well, avoid any interruptions of sleep and avoid any distractions in the bedroom that may interfere with adequate sleep quality 7. Avoid sugar, avoid sweets as there is a strong link between excessive sugar intake, diabetes, and cognitive impairment We discussed the Mediterranean diet, which has been shown to help patients reduce the risk of progressive memory disorders and reduces cardiovascular risk. This includes eating fish, eat fruits and green leafy vegetables, nuts like almonds and hazelnuts, walnuts, and also use olive oil. Avoid fast foods and fried foods as much as possible. Avoid sweets and sugar as sugar use has been linked to worsening of memory function.  There is always a concern of gradual progression of memory problems. If this is the case, then we may need to adjust level of care according to patient needs. Support, both to the patient and caregiver, should then be put into place.    The [employer] is here all day, every day for people facing Alzheimer's disease through our free 24/7 Helpline: [phone]. The Helpline provides reliable information and support to all those who need assistance, such as individuals living with memory loss, Alzheimer's or other dementia, caregivers, health care professionals and the public.  Our highly trained and knowledgeable staff can help you with: Understanding memory loss, dementia and Alzheimer's    Medications and other treatment options  General information about aging and brain health  Skills to provide quality care and to find  the best care from professionals  Legal, financial and living-arrangement decisions Our Helpline also features: Confidential care consultation provided by master's level clinicians who can help with decision-making support, crisis assistance and education on issues families face every day  Help in a caller's preferred language using our translation service that features more than 200 languages and dialects  Referrals to local community programs, services and ongoing support     FALL PRECAUTIONS: Be cautious when walking. Scan the area for obstacles that may increase the risk of trips and falls. When getting up in the mornings, sit up at the edge of the bed for a few minutes before getting out of bed. Consider elevating the bed at the head end to avoid drop of blood pressure when getting up. Walk always in a well-lit room (use night lights in the walls). Avoid area rugs or power cords from appliances in the middle of the walkways. Use a walker or a cane if necessary and consider physical therapy for balance exercise. Get your eyesight checked regularly.  FINANCIAL OVERSIGHT: Supervision, especially oversight when making financial decisions or transactions is also recommended.  HOME SAFETY: Consider the safety of the kitchen when operating appliances like stoves, microwave oven, and blender. Consider having supervision and share cooking responsibilities until no longer able to participate in those. Accidents with firearms and other hazards in the house should be identified and addressed as well.   ABILITY TO BE LEFT ALONE: If patient is unable to contact 911 operator, consider using LifeLine, or when the need is there, arrange for someone to stay with patients. Smoking is a fire hazard, consider supervision or cessation. Risk of wandering should be assessed by caregiver and if detected at any point, supervision and safe proof recommendations should be instituted.  MEDICATION SUPERVISION: Inability  to self-administer medication needs to be constantly addressed. Implement a mechanism to ensure safe administration of the medications.   DRIVING: Regarding driving, in patients with progressive memory problems, driving will be impaired. We advise to have someone else do the driving if trouble finding directions or if minor accidents are reported. Independent driving assessment is available to determine safety of driving.   If you are interested in the driving assessment, you can contact the following:  [employer] in [place] [phone]  [facility] [phone]  [facility] [phone]  [facility] [phone] or [phone]      Mediterranean Diet A Mediterranean diet refers to food and lifestyle choices that are based on the traditions of countries located on the [place]. This way of eating has been shown to help prevent certain conditions and improve outcomes for people who have chronic diseases, like kidney disease and heart disease. What are tips for following this plan? Lifestyle  Cook and eat meals together with your family, when possible. Drink enough fluid to keep your urine clear or pale yellow. Be physically active every day. This includes: Aerobic exercise like running or swimming. Leisure activities like gardening, walking, or housework. Get 7-8 hours of sleep each night. If recommended by your health care provider, drink red wine in moderation. This means 1 glass a day for nonpregnant women and 2 glasses a day for men. A glass of wine equals 5 oz (150 mL). Reading food labels  Check the serving size of packaged foods. For foods such as rice and   pasta, the serving size refers to the amount of cooked product, not dry. Check the total fat in packaged foods. Avoid foods that have saturated fat or trans fats. Check the ingredients list for added sugars, such as corn syrup. Shopping  At the grocery store, buy  most of your food from the areas near the walls of the store. This includes: Fresh fruits and vegetables (produce). Grains, beans, nuts, and seeds. Some of these may be available in unpackaged forms or large amounts (in bulk). Fresh seafood. Poultry and eggs. Low-fat dairy products. Buy whole ingredients instead of prepackaged foods. Buy fresh fruits and vegetables in-season from local farmers markets. Buy frozen fruits and vegetables in resealable bags. If you do not have access to quality fresh seafood, buy precooked frozen shrimp or canned fish, such as tuna, salmon, or sardines. Buy small amounts of raw or cooked vegetables, salads, or olives from the deli or salad bar at your store. Stock your pantry so you always have certain foods on hand, such as olive oil, canned tuna, canned tomatoes, rice, pasta, and beans. Cooking  Cook foods with extra-virgin olive oil instead of using butter or other vegetable oils. Have meat as a side dish, and have vegetables or grains as your main dish. This means having meat in small portions or adding small amounts of meat to foods like pasta or stew. Use beans or vegetables instead of meat in common dishes like chili or lasagna. Experiment with different cooking methods. Try roasting or broiling vegetables instead of steaming or sauteing them. Add frozen vegetables to soups, stews, pasta, or rice. Add nuts or seeds for added healthy fat at each meal. You can add these to yogurt, salads, or vegetable dishes. Marinate fish or vegetables using olive oil, lemon juice, garlic, and fresh herbs. Meal planning  Plan to eat 1 vegetarian meal one day each week. Try to work up to 2 vegetarian meals, if possible. Eat seafood 2 or more times a week. Have healthy snacks readily available, such as: Vegetable sticks with hummus. Greek yogurt. Fruit and nut trail mix. Eat balanced meals throughout the week. This includes: Fruit: 2-3 servings a day Vegetables: 4-5  servings a day Low-fat dairy: 2 servings a day Fish, poultry, or lean meat: 1 serving a day Beans and legumes: 2 or more servings a week Nuts and seeds: 1-2 servings a day Whole grains: 6-8 servings a day Extra-virgin olive oil: 3-4 servings a day Limit red meat and sweets to only a few servings a month What are my food choices? Mediterranean diet Recommended Grains: Whole-grain pasta. Brown rice. Bulgar wheat. Polenta. Couscous. Whole-wheat bread. Oatmeal. Quinoa. Vegetables: Artichokes. Beets. Broccoli. Cabbage. Carrots. Eggplant. Green beans. Chard. Kale. Spinach. Onions. Leeks. Peas. Squash. Tomatoes. Peppers. Radishes. Fruits: Apples. Apricots. Avocado. Berries. Bananas. Cherries. Dates. Figs. Grapes. Lemons. Melon. Oranges. Peaches. Plums. Pomegranate. Meats and other protein foods: Beans. Almonds. Sunflower seeds. Pine nuts. Peanuts. Cod. Salmon. Scallops. Shrimp. Tuna. Tilapia. Clams. Oysters. Eggs. Dairy: Low-fat milk. Cheese. Greek yogurt. Beverages: Water. Red wine. Herbal tea. Fats and oils: Extra virgin olive oil. Avocado oil. Grape seed oil. Sweets and desserts: Greek yogurt with honey. Baked apples. Poached pears. Trail mix. Seasoning and other foods: Basil. Cilantro. Coriander. Cumin. Mint. Parsley. Sage. Rosemary. Tarragon. Garlic. Oregano. Thyme. Pepper. Balsalmic vinegar. Tahini. Hummus. Tomato sauce. Olives. Mushrooms. Limit these Grains: Prepackaged pasta or rice dishes. Prepackaged cereal with added sugar. Vegetables: Deep fried potatoes (french fries). Fruits: Fruit canned in syrup. Meats and other protein   foods: Beef. Pork. Lamb. Poultry with skin. Hot dogs. Bacon. Dairy: Ice cream. Sour cream. Whole milk. Beverages: Juice. Sugar-sweetened soft drinks. Beer. Liquor and spirits. Fats and oils: Butter. Canola oil. Vegetable oil. Beef fat (tallow). Lard. Sweets and desserts: Cookies. Cakes. Pies. Candy. Seasoning and other foods: Mayonnaise. Premade sauces and  marinades. The items listed may not be a complete list. Talk with your dietitian about what dietary choices are right for you. Summary The Mediterranean diet includes both food and lifestyle choices. Eat a variety of fresh fruits and vegetables, beans, nuts, seeds, and whole grains. Limit the amount of red meat and sweets that you eat. Talk with your health care provider about whether it is safe for you to drink red wine in moderation. This means 1 glass a day for nonpregnant women and 2 glasses a day for men. A glass of wine equals 5 oz (150 mL). This information is not intended to replace advice given to you by your health care provider. Make sure you discuss any questions you have with your health care provider. Document Released: 04/24/2016 Document Revised: 05/27/2016 Document Reviewed: 04/24/2016 Elsevier Interactive Patient Education  2017 [employer]    e 

## 2022-02-11 NOTE — Progress Notes (Signed)
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 She was first seen on 01/17/21 at which time she had a MoCA of 14/30, suspicious for mild dementia. MRI without contrast showed asymmetric atrophy of the anterior right temporal lobe. Neurocognitive testing on 03/27/2021 indicated mild dementia likely due to Alzheimer's disease, right temporal variant FTD.  She was started on donepezil 10 mg daily.  She has not been very compliant with the medication.  Instructions are given to her daughter to "take over the administration of the medications ".  Today's MMSE is 24/30 with delayed recall 1/3    Recommendations:    Continue  donepezil 10 mg.  Daughter to be more involved in the administration of the medicine.  Side effects were discussed Follow up in  6 months. Repeat neurocognitive testing for clarity of diagnosis and disease trajectory    Case discussed with Dr. Delice Lesch who agrees with the plan     Subjective:    Elizabeth Mcintyre is a very pleasant 81 y.o. RH female  seen today in follow up for memory loss. This patient is accompanied in the office by her daughter who supplements the history.  Previous records as well as any outside records available were reviewed prior to todays visit.  Patient was last seen at our office on  at which time her 08/14/2021.  Last MMSE in July 2022 was 25/30. Patient is currently on   Any changes in memory since last visit?Reports that she may be experiencing a slight worsening of her short-term memory.  She also has more frequent dj vu's.  Her long-term memory is normal.   Patient lives with: Daughter.  She has moved from [place] to [place] to the house with her husband has been ill.  Her  husband died 21 years ago, and at times these affects her mood and memory "becomes very nostalgic ".  Repeats oneself?  Occasionally Disoriented when walking into a room?  Patient denies   Leaving objects in unusual places?  Patient denies   Ambulates  with difficulty?   Patient denies   Recent falls?  Patient denies   Any head injuries?  Patient denies   History of seizures?   Patient denies   Wandering behavior?  Patient denies   Patient drives?   Patient no longer drives  Any mood changes such irritability agitation? Occasionally she gets "blue "after moving to [place] to her home built by her husband when they first got married.   Any history of depression?:  Patient denies   Hallucinations?  Patient denies   Paranoia?  Patient denies   Patient reports that he sleeps well without vivid dreams, REM behavior or sleepwalking   History of sleep apnea?  Patient denies   Any hygiene concerns?  Patient denies, she has always taken birdbaths Independent of bathing and dressing?  Endorsed  Does the patient needs help with medications? Patient is charge , she chooses not to take them when she begins to feel better Who is in charge of the finances?  Daughter is monitoring her Any changes in appetite?  Patient denies   Patient have trouble swallowing? Patient denies   Does the patient cook?  Patient denies   Any kitchen accidents such as leaving the stove  on? Patient denies   Any headaches?  Patient denies   The double vision? Patient denies   Any focal numbness or tingling?  Patient denies   Chronic back pain Patient denies   Unilateral weakness?  Patient denies   Any tremors?  Patient denies   Any history of anosmia?  Patient denies   Any incontinence of urine?  I am intermittently incontinent, wears pads.   Any bowel dysfunction?   Patient bouts of diarrhea, but not recently       Neuropsychological testing on 03/27/2021:  demonstrating "cognitive impairment amounting to a dementia  level problem. While she does have memory impairment and naming problems, her memory is relatively better preserved than typical for AD at this point in the illness. She has asymmetric atrophy of the anterior right temporal lobe on structural MRI, a radiologic presentation associated with the so-called "right temporal variant FTD."  Considering base rates and the patients demographics, Alzheimer's is certainly high on the differential and she does meet criteria for AD, although I am also worried about other proteinopathies (e.g., TDP-43 and Tau) more often associated with this clinico-radiologic entity. I think it is clear that this is neurodegenerative, and thus that there will be progression across time. If diagnostic clarity is a goal of care, LP for CSF biomarkers could be considered, although this wouldn't necessarily change her prognosis. A trial of antidementia medication would also be appropriate. I have no major concerns about the patient's driving at this point in time but would recommend that make use of GPS or other safeguards and/or limit her driving to local trips given her difficulties with navigation, which are particularly common in rtvFTD. 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 She started noticing changes a year ago, stating "I don't have a memory." Her daughter Elizabeth Mcintyre started noticing changes around 2 years ago when she would get lost driving to [facility] house. She was repeating herself more. She had been diagnosed with lymphoma and they initially attributed this to "chemo fog." She is scheduled for 2 more sessions of immunotherapy then she will be done for now. Symptoms have been the same over the past year. She drives on occasion still and  would get lost but find her way back or call her daughter. She would not recall how to get to her house of 30 years. She was missing bills/taxes 2 years ago, Elizabeth Mcintyre started helping her last year. When she tried to organize her bills and write them down, she would get confused and rearrange her system, then forget the system she made. Elizabeth Mcintyre has been helping with her pillbox but would come and see pills there a week later. She denies leaving the stove on. She misplaces things constantly. She has been living with a housemate for the past 12 years who stays on the other side of the house, she is usually by herself. She is independent with dressing and bathing.    She reports a sensation on the left frontal region going down her face for the past month. She says if feels funny, her neck feels funny, "not pain," but like something running down. Sensation occurs on a daily basis. Her vision is more blurred on the left eye. She notices the left eyelid  stays closed especially when she wakes up, she has to pull up her lid. There is some nausea, no vomiting. She describes the sensation is "like a fog," with headache 7/10 going down to 1/10. She has been taking Tylenol every night for the past month, it does not help much. She states that she was previously sleeping well, but for the past month, "I don't sleep." She did not sleep last night, she does not take naps. She denies any dizziness, dysarthria, back pain, focal numbness/tingling/weakness, bowel/bladder dysfunction, anosmia, tremors. She has shoulder pain. No family history of dementia, no history of significant head injuries or alcohol use. She gets depressed once in a while, her brain is trying to figure out what she wants to do next. She has 2 homes and constantly moves things around. Elizabeth Mcintyre denies any paranoia or hallucinations, she knows the patient is overwhelmed because there are things to put in order.    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    PREVIOUS MEDICATIONS:   CURRENT MEDICATIONS:  Outpatient Encounter Medications as of 02/11/2022  Medication Sig   acetaminophen (TYLENOL) 325 MG tablet Take 325 mg by mouth every 6 (six) hours as needed for moderate pain.    amLODipine (NORVASC) 10 MG tablet Take 1 tablet (10 mg total) by mouth daily.   Ascorbic Acid (VITAMIN C) 100 MG tablet Take 100 mg by mouth daily.   cetirizine (ZYRTEC) 10 MG tablet Take 10 mg by mouth at bedtime. As needed   Cholecalciferol (VITAMIN D3) 25 MCG (1000 UT) CAPS Take 1,000 Units by mouth daily.    donepezil (ARICEPT) 10 MG tablet Take 1 tablet daily   levothyroxine (SYNTHROID) 75 MCG tablet Take 1 tablet (75 mcg total) by mouth daily.   lidocaine-prilocaine (EMLA) cream Apply 1 application topically as needed. (Patient taking differently: Apply 1 application. topically as needed (port access).)   mirtazapine (REMERON) 7.5 MG tablet Take 1 tablet (7.5 mg total) by mouth at bedtime.   potassium chloride SA (KLOR-CON) 20 MEQ tablet Take 1 tablet (20 mEq total) by mouth daily.   rOPINIRole (REQUIP) 0.25 MG tablet Take 1 tablet (0.25 mg total) by mouth at bedtime.   rosuvastatin (CRESTOR) 20 MG tablet Take 1 tablet (20 mg total) by mouth daily.   sucralfate (CARAFATE) 1 g tablet Take 1 g by mouth 4 (four) times daily -  with meals and at bedtime. As needed   vitamin B-12 (CYANOCOBALAMIN) 1000 MCG tablet Take 1,000 mcg by mouth daily.    albuterol (VENTOLIN HFA) 108 (90 Base) MCG/ACT inhaler Inhale 2 puffs into the lungs every 6 (six) hours as needed for wheezing.   pantoprazole (PROTONIX) 40 MG tablet Take 1 tablet (40 mg total) by mouth daily.   No facility-administered encounter medications on file as of 02/11/2022.       02/11/2022    3:00 PM 03/27/2021    2:00 PM 09/29/2018   10:40 AM   MMSE - Mini Mental State Exam  Orientation to time '4 5 5  '$ Orientation to Place '5 5 5  '$ Registration '3 3 3  '$ Attention/ Calculation '5 3 3  '$ Recall '1 2 2  '$ Language- name 2 objects '2 2 2  '$ Language- repeat '1 1 1  '$ Language- follow 3 step command '2 1 1  '$ Language- read & follow direction 1 1 1  Write a sentence 0 1 1  Copy design 0 1 1  Total score '24 25 25      '$ 01/17/2021    9:00 AM  Montreal Cognitive Assessment   Visuospatial/ Executive (0/5) 1  Naming (0/3) 0  Attention: Read list of digits (0/2) 2  Attention: Read list of letters (0/1) 0  Attention: Serial 7 subtraction starting at 100 (0/3) 2  Language: Repeat phrase (0/2) 1  Language : Fluency (0/1) 0  Abstraction (0/2) 0  Delayed Recall (0/5) 1  Orientation (0/6) 6  Total 13  Adjusted Score (based on education) 14    Objective:     PHYSICAL EXAMINATION:    VITALS:   Vitals:   02/11/22 1459  BP: (!) 173/78  Pulse: 62  Resp: 18  SpO2: 95%  Weight: 127 lb (57.6 kg)    GEN:  The patient appears stated age and is in NAD. HEENT:  Normocephalic, atraumatic.   Neurological examination:  General: NAD, well-groomed, appears stated age. Orientation: The patient is alert. Oriented to person, place and date Cranial nerves: There is good facial symmetry.The speech is fluent and clear. No aphasia or dysarthria. Fund of knowledge is appropriate. Recent memory is impaired, remote memory is normal.   Attention and concentration are reduced.  Able to name objects and repeat phrases.  Hearing is intact to conversational tone.   Delayed recall 1/3 Sensation: Sensation is intact to light touch throughout Motor: Strength is at least antigravity x4. Tremors: none  DTR's 2/4 in UE/LE     Movement examination: Tone: There is normal tone in the UE/LE Abnormal movements:  no tremor.  No myoclonus.  No asterixis.   Coordination:  There is no decremation with RAM's. Normal finger to nose  Gait and Station: The patient has no  difficulty arising out of a deep-seated chair without the use of the hands. The patient's stride length is good.  Gait is cautious and narrow.    Thank you for allowing [place] the opportunity to participate in the care of this nice patient. Please do not hesitate to contact us for any questions or concerns.   Total time spent on today's visit was 30 minutes dedicated to this patient today, preparing to see patient, examining the patient, ordering tests and/or medications and counseling the patient, documenting clinical information in the EHR or other health record, independently interpreting results and communicating results to the patient/family, discussing treatment and goals, answering patient's questions and coordinating care.  Cc:  Lindell Spar, MD  Sharene Butters 02/11/2022 3:43 PM

## 2022-04-07 ENCOUNTER — Other Ambulatory Visit: Payer: Self-pay

## 2022-04-11 ENCOUNTER — Other Ambulatory Visit: Payer: Self-pay | Admitting: Internal Medicine

## 2022-04-11 ENCOUNTER — Ambulatory Visit (INDEPENDENT_AMBULATORY_CARE_PROVIDER_SITE_OTHER): Payer: Medicare HMO

## 2022-04-11 ENCOUNTER — Ambulatory Visit
Admission: EM | Admit: 2022-04-11 | Discharge: 2022-04-11 | Disposition: A | Discharge status: Home / Self Care | Payer: Medicare HMO | Attending: Nurse Practitioner | Admitting: Nurse Practitioner

## 2022-04-11 ENCOUNTER — Other Ambulatory Visit: Payer: Self-pay | Admitting: Neurology

## 2022-04-11 ENCOUNTER — Encounter: Payer: Self-pay | Admitting: Emergency Medicine

## 2022-04-11 DIAGNOSIS — I10 Essential (primary) hypertension: Secondary | ICD-10-CM

## 2022-04-11 DIAGNOSIS — M25511 Pain in right shoulder: Secondary | ICD-10-CM

## 2022-04-11 DIAGNOSIS — E039 Hypothyroidism, unspecified: Secondary | ICD-10-CM

## 2022-04-11 DIAGNOSIS — M19011 Primary osteoarthritis, right shoulder: Secondary | ICD-10-CM | POA: Diagnosis not present

## 2022-04-11 MED ORDER — TIZANIDINE HCL 4 MG PO TABS: 4.0000 mg | ORAL_TABLET | Freq: Every evening | ORAL | 0 refills | Status: DC | PRN

## 2022-04-11 MED ORDER — PREDNISONE 20 MG PO TABS: 20.0000 mg | ORAL_TABLET | Freq: Every day | ORAL | 0 refills | Status: AC

## 2022-04-11 MED ORDER — METHYLPREDNISOLONE SODIUM SUCC 125 MG IJ SOLR
60.0000 mg | Freq: Once | INTRAMUSCULAR | Status: AC
  Administered 2022-04-11: 60 mg via INTRAMUSCULAR

## 2022-04-11 NOTE — Discharge Instructions (Addendum)
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  I do not think this explains your pain, I suspect there may be an inflamed nerve that is causing your pain -We have given you a shot of steroid to help with inflammation.  Please start the oral steroid tomorrow morning and take daily for 5 days -You can use tizanidine which is a muscle relaxant at nighttime as needed for pain.  Continue Tylenol. -Follow-up with orthopedic provider if symptoms persist worsen despite treatment -Please make sure you are taking your blood pressure medication as prescribed.  If you develop vision changes, severe headache, dizziness/lightness, shortness of breath, or chest pain, go to the emergency room.

## 2022-04-11 NOTE — ED Provider Notes (Signed)
[facility]    CSN: [id number] Arrival date & time: 04/11/22  1344      History   Chief Complaint No chief complaint on file.   HPI Elizabeth Mcintyre is a 81 y.o. female.   Patient presents with daughter for 2 days of right shoulder pain that is shooting to fingertips.  Patient denies any recent accident, fall, trauma, or injury to the shoulder.  Denies any known injury.  Reports the pain is sudden and intermittent, shooting, severe.  Happens with certain movements, any movement of the shoulder.  Reports the pain radiates to her fingertips.  She still having some sharp pain in her scapula and the right side of her chest that radiates from the shoulder.  Has tried rest, Tylenol which does help minimally.  She denies any weakness, numbness, decreased grip strength, redness, swelling, bruising, or fevers, nausea/vomiting.  Medical history significant for essential hypertension, history of lymphoma, and stage IIIa chronic kidney disease.  Daughter reports also recent diagnosis of dementia.  She has been having a hard time remembering to take all of her medicine and has been missing her blood pressure medicine.  The patient denies any vision changes, chest pain, shortness of breath, lightheadedness/dizziness, headache today.  She has a follow-up appointment with her primary care provider in a couple of weeks.     Past Medical History:  Diagnosis Date   Asthma    Bloating 04/26/2019   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   Cancer ([place]) 2021   Lymphoma   Chronic SI joint pain    was on tramadol   Depression with anxiety 04/03/2011   DIABETES MELLITUS, TYPE II 11/09/2007   diet control   Diverticulosis 03/2011   Fecal incontinence 07/07/2019   GERD (gastroesophageal reflux disease)    none recently   GI bleed    Gout 06/09/2014   R great toe, ball of foot Stopped HCTZ several mos ago,  On losartan        Hematemesis 03/30/2020   History of rheumatoid arthritis    during 30's, was  treated.   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   Lower GI bleeding 04/30/2020   Lymphadenopathy 01/23/2020   Osteopenia 2017   Last  bone density 05/04/2017: -2.4   PONV (postoperative nausea and vomiting)    Schatzki's ring    Stress incontinence    Stroke [facility])    mini stroke - found on a CT scan    Patient Active Problem List   Diagnosis Date Noted   Fall 07/22/2021   Restless legs syndrome 01/02/2021   Prediabetes 08/14/2020   Mixed hyperlipidemia 08/14/2020   Active chemotherapy 04/30/2020   Dysphagia 04/18/2020   Port-A-Cath in place 03/28/2020   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   Hepatic steatosis 01/04/2020   Splenomegaly 01/04/2020   Thrombocytopenia ([place]) 01/04/2020   Diarrhea 09/10/2018   Carotid artery stenosis 06/13/2016   Stage 3a chronic kidney disease ([place]) 02/14/2016   Vitamin D deficiency 05/25/2015   Gastroesophageal reflux disease with esophagitis 03/06/2015   Acquired hypothyroidism 05/30/2014   Osteopenia 12/02/2013   Major depression, recurrent, chronic ([place]) 04/03/2011   Mild intermittent asthma 11/09/2007   Essential hypertension, benign 11/09/2007    Past Surgical History:  Procedure Laterality Date   ABDOMINAL HYSTERECTOMY     BALLOON DILATION N/A 08/21/2020   Procedure: BALLOON DILATION;  Surgeon: Eloise Harman, DO;  [facility];  Service: Endoscopy;  Laterality: N/A;   BIOPSY  03/30/2020   Procedure: BIOPSY;  Surgeon: Ronald Lobo, MD;  [facility];  Service: Endoscopy;;   BIOPSY  05/02/2020   Procedure: BIOPSY;  Surgeon: Montez Morita, Quillian Quince, MD;  Location: [facility];  Service: Gastroenterology;;   CARDIAC CATHETERIZATION     X 2, last one in [place] 04/13/2017   Procedure: LAPAROSCOPIC CHOLECYSTECTOMY;  Surgeon: Aviva Signs, MD;  Location: [facility];  Service: General;  Laterality: N/A;    COLONOSCOPY     COLONOSCOPY  May 2012   Dr. Olevia Perches: mild diverticulosis, otherwise normal.    COLONOSCOPY WITH PROPOFOL N/A 05/02/2020   Dr. Jenetta Downer: 8 mm polyp removed from the ascending colon, 2 mm polyp removed from the ascending colon.  Tubular adenomas.  Diverticulosis.  Mucosal ulceration in the sigmoid colon noted, pathology consistent with ischemic colitis.   ESOPHAGOGASTRODUODENOSCOPY N/A 01/28/2015   Dr. Gala Romney: reflux esophagitis, Schatzki's ring not manipulated due to recent bleeding   ESOPHAGOGASTRODUODENOSCOPY N/A 03/30/2015   Dr. Gala Romney: Schatzki's ring s/p Venia Minks dilation, previously noted esophageal ulcer completely healed   ESOPHAGOGASTRODUODENOSCOPY N/A 03/30/2020   Buccini: Moderately severe erosive, circumferential, confluent esophagitis with no bleeding found 25 to 40 cm from incisors.  Nonobstructing and mild Schatzki ring, there were also multiple distal esophageal rings noted, minimal hiatal hernia.   ESOPHAGOGASTRODUODENOSCOPY (EGD) WITH PROPOFOL N/A 08/21/2020   Procedure: ESOPHAGOGASTRODUODENOSCOPY (EGD) WITH PROPOFOL;  Surgeon: Eloise Harman, DO;  [facility];  Service: Endoscopy;  Laterality: N/A;  2:15pm   IR IMAGING GUIDED PORT INSERTION  03/13/2020   IR REMOVAL TUN ACCESS W/ PORT W/O FL MOD SED  10/01/2021   LYMPH NODE BIOPSY Left 03/20/2020   Procedure: LEFT POSTERIOR CERVICAL LYMPH NODE BIOPSY;  Surgeon: Georganna Skeans, MD;  Location: [place];  Service: General;  Laterality: Left;   MALONEY DILATION N/A 03/30/2015   Procedure: Keturah Shavers;  Surgeon: Daneil Dolin, MD;  [facility];  Service: Endoscopy;  Laterality: N/A;   POLYPECTOMY  05/02/2020   Procedure: POLYPECTOMY;  Surgeon: Harvel Quale, MD;  [facility];  Service: Gastroenterology;;    OB History   No obstetric history on file.      Home Medications    Prior to Admission medications   Medication Sig Start Date End Date Taking? Authorizing  Provider  predniSONE (DELTASONE) 20 MG tablet Take 1 tablet (20 mg total) by mouth daily with breakfast for 5 days. 04/11/22 04/16/22 Yes Eulogio Bear, NP  tiZANidine (ZANAFLEX) 4 MG tablet Take 1 tablet (4 mg total) by mouth at bedtime as needed for muscle spasms. Do not take with alcohol or while driving or operating heavy machinery 04/11/22  Yes Eulogio Bear, NP  acetaminophen (TYLENOL) 325 MG tablet Take 325 mg by mouth every 6 (six) hours as needed for moderate pain.     [provider]  albuterol (VENTOLIN HFA) 108 (90 Base) MCG/ACT inhaler Inhale 2 puffs into the lungs every 6 (six) hours as needed for wheezing. 08/14/20 08/14/21  Kuneff, Renee A, DO  amLODipine (NORVASC) 10 MG tablet Take 1 tablet (10 mg total) by mouth daily. 11/13/20   Kuneff, Renee A, DO  Ascorbic Acid (VITAMIN C) 100 MG tablet Take 100 mg by mouth daily.    [provider]  cetirizine (ZYRTEC) 10 MG tablet Take 10 mg by mouth at bedtime. As needed  [provider]  Cholecalciferol (VITAMIN D3) 25 MCG (1000 UT) CAPS Take 1,000 Units by mouth daily.     [provider]  donepezil (ARICEPT) 10 MG tablet Take 1 tablet daily 03/22/21   Cameron Sprang, MD  levothyroxine (SYNTHROID) 75 MCG tablet Take 1 tablet (75 mcg total) by mouth daily. 02/13/21   Lindell Spar, MD  lidocaine-prilocaine (EMLA) cream Apply 1 application topically as needed. Patient taking differently: Apply 1 application. topically as needed (port access). 04/12/20   Orson Slick, MD  mirtazapine (REMERON) 7.5 MG tablet Take 1 tablet (7.5 mg total) by mouth at bedtime. 07/22/21   Lindell Spar, MD  pantoprazole (PROTONIX) 40 MG tablet Take 1 tablet (40 mg total) by mouth daily. 11/15/20 11/15/21  Eloise Harman, DO  potassium chloride SA (KLOR-CON) 20 MEQ tablet Take 1 tablet (20 mEq total) by mouth daily. 07/13/20   Orson Slick, MD  rOPINIRole (REQUIP) 0.25 MG tablet Take 1 tablet (0.25 mg total) by  mouth at bedtime. 11/13/20   Kuneff, Renee A, DO  rosuvastatin (CRESTOR) 20 MG tablet Take 1 tablet (20 mg total) by mouth daily. 11/14/20   Kuneff, Renee A, DO  sucralfate (CARAFATE) 1 g tablet Take 1 g by mouth 4 (four) times daily -  with meals and at bedtime. As needed    [provider]  vitamin B-12 (CYANOCOBALAMIN) 1000 MCG tablet Take 1,000 mcg by mouth daily.     [provider]    Family History Family History  Problem Relation Age of Onset   Heart disease Mother    Osteoarthritis Mother    Sudden death Father    Single kidney Father    Other Father        h/o severe MVA injuries   Hyperlipidemia Sister    Other Daughter        Myalgias   Fibromyalgia Daughter    Allergies Daughter    Heart disease Maternal Grandfather    Sudden death Paternal Grandmother    Diabetes Paternal Grandfather    Heart disease Daughter    Other Daughter        palpitations   Pulmonary fibrosis Maternal Aunt    Cancer Paternal Uncle    Pulmonary fibrosis Maternal Aunt    Colon cancer Neg Hx     Social History Social History   Tobacco Use   Smoking status: Never   Smokeless tobacco: Never  Vaping Use   Vaping Use: Never used  Substance Use Topics   Alcohol use: No   Drug use: No     Allergies   Codeine phosphate   Review of Systems Review of Systems Per HPI  Physical Exam Triage Vital Signs ED Triage Vitals  Enc Vitals Group     BP 04/11/22 1352 (!) 176/124     Pulse Rate 04/11/22 1352 (!) 111     Resp 04/11/22 1352 18     Temp 04/11/22 1352 97.8 F (36.6 C)     Temp Source 04/11/22 1352 Oral     SpO2 04/11/22 1352 98 %     Weight --      Height --      Head Circumference --      Peak Flow --      Pain Score 04/11/22 1353 10     Pain Loc --      Pain Edu? --      Excl. in [place]? --  No data found.  Updated Vital Signs BP (!) 176/124 (BP Location: Left Arm) Comment: states she has not taken her BP medication today  Pulse (!) 111   Temp 97.8  F (36.6 C) (Oral)   Resp 18   SpO2 98%   Visual Acuity Right Eye Distance:   Left Eye Distance:   Bilateral Distance:    Right Eye Near:   Left Eye Near:    Bilateral Near:     Physical Exam Vitals and nursing note reviewed.  Constitutional:      General: She is not in acute distress.    Appearance: Normal appearance. She is not toxic-appearing.  HENT:     Head: Normocephalic and atraumatic.     Nose: Nose normal. No congestion.     Mouth/Throat:     Mouth: Mucous membranes are moist.     Pharynx: Oropharynx is clear.  Pulmonary:     Effort: Pulmonary effort is normal. No respiratory distress.  Musculoskeletal:     Right shoulder: Tenderness and bony tenderness present. No swelling, deformity or crepitus. Normal range of motion. Normal strength. Normal pulse.     Left shoulder: Normal.     Right elbow: No swelling or deformity. Normal range of motion. Tenderness present.     Left elbow: Normal.     Right forearm: Tenderness present. No swelling or bony tenderness.     Left forearm: Normal.     Right wrist: Tenderness present. Normal range of motion. Normal pulse.     Left wrist: Normal.  Skin:    General: Skin is warm and dry.     Capillary Refill: Capillary refill takes less than 2 seconds.     Coloration: Skin is not jaundiced or pale.     Findings: No erythema.  Neurological:     Mental Status: She is alert and oriented to person, place, and time.  Psychiatric:        Behavior: Behavior is cooperative.      UC Treatments / Results  Labs (all labs ordered are listed, but only abnormal results are displayed) Labs Reviewed - No data to display  EKG   Radiology DG Shoulder Right  Result Date: 04/11/2022 CLINICAL DATA:  Shoulder pain shooting to fingertips. No known injury. EXAM: RIGHT SHOULDER - 2+ VIEW COMPARISON:  None Available. FINDINGS: Mild acromioclavicular peripheral degenerative osteophytes. Mild inferior glenoid degenerative osteophytosis. No  acute fracture or dislocation. Moderate calcification within the aortic arch. Moderate multilevel degenerative disc changes of the visualized thoracic spine. IMPRESSION: Very mild acromioclavicular and glenohumeral osteoarthritis. Electronically Signed   By: Yvonne Kendall M.D.   On: 04/11/2022 14:33    Procedures Procedures (including critical care time)  Medications Ordered in UC Medications  methylPREDNISolone sodium succinate (SOLU-MEDROL) 125 mg/2 mL injection 60 mg (60 mg Intramuscular Given 04/11/22 1455)    Initial Impression / Assessment and Plan / UC Course  I have reviewed the triage vital signs and the nursing notes.  Pertinent labs & imaging results that were available during my care of the patient were reviewed by me and considered in my medical decision making (see chart for details).    Patient is a pleasant, well-appearing 81 year old female presenting for right shoulder pain today.  X-ray imaging shows mild clavicular and glenohumeral osteoarthritis.  Discussed findings with patient and daughter.  Given medical history, unable to use NSAIDs, uncomfortable using gabapentin, or narcotic.  Solu-Medrol 60 mg IM given to help with inflammation/pain.  Start oral prednisone tomorrow morning.  Also start tizanidine muscle accident nighttime as needed for pain.  Continue Tylenol as needed for pain.  Follow-up with orthopedic provider with no improvement.  If pain worsens, go to ER.  Also discussed elevated blood pressure today and recommended ER evaluation if any symptoms develop like headache, vision changes, dizziness/lightheadedness, chest pain, shortness of breath.  Encouraged compliance with blood pressure medication, follow-up with PCP if blood pressure remains elevated above 140/90 at home. Final Clinical Impressions(s) / UC Diagnoses   Final diagnoses:  Acute pain of right shoulder  Essential hypertension, benign     Discharge Instructions      - X-ray today shows mild  osteoarthritis of your right shoulder.  I do not think this explains your pain, I suspect there may be an inflamed nerve that is causing your pain -We have given you a shot of steroid to help with inflammation.  Please start the oral steroid tomorrow morning and take daily for 5 days -You can use tizanidine which is a muscle relaxant at nighttime as needed for pain.  Continue Tylenol. -Follow-up with orthopedic provider if symptoms persist worsen despite treatment -Please make sure you are taking your blood pressure medication as prescribed.  If you develop vision changes, severe headache, dizziness/lightness, shortness of breath, or chest pain, go to the emergency room.     ED Prescriptions     Medication Sig Dispense Auth. Provider   predniSONE (DELTASONE) 20 MG tablet Take 1 tablet (20 mg total) by mouth daily with breakfast for 5 days. 5 tablet Noemi Chapel A, NP   tiZANidine (ZANAFLEX) 4 MG tablet Take 1 tablet (4 mg total) by mouth at bedtime as needed for muscle spasms. Do not take with alcohol or while driving or operating heavy machinery 30 tablet Eulogio Bear, NP      PDMP not reviewed this encounter.   Eulogio Bear, NP 04/11/22 [phone]

## 2022-04-11 NOTE — ED Triage Notes (Signed)
Pain in right shoulder that shoots down to fingers that started yesterday. No known injury.  States pain is getting worse.  States she cannot raise her arm.

## 2022-04-15 ENCOUNTER — Other Ambulatory Visit: Payer: Self-pay

## 2022-04-15 ENCOUNTER — Telehealth: Payer: Self-pay

## 2022-04-15 NOTE — Telephone Encounter (Signed)
Patient called needs refill on meds completely out bp 176/129.    amLODipine (NORVASC) 10 MG tablet  Pharmacy: [facility] [address]

## 2022-04-15 NOTE — Telephone Encounter (Signed)
Called patient has appt 08.02.2023

## 2022-04-15 NOTE — Telephone Encounter (Signed)
Before we send this medication please see if patient can come in for a visit with provider  Attempted to call patient NA

## 2022-04-16 ENCOUNTER — Encounter: Payer: Self-pay | Admitting: Internal Medicine

## 2022-04-16 ENCOUNTER — Ambulatory Visit: Payer: Medicare HMO | Admitting: Internal Medicine

## 2022-04-16 VITALS — BP 142/92 | HR 61 | Resp 16 | Ht 62.0 in | Wt 130.4 lb

## 2022-04-16 DIAGNOSIS — M25511 Pain in right shoulder: Secondary | ICD-10-CM

## 2022-04-16 DIAGNOSIS — I10 Essential (primary) hypertension: Secondary | ICD-10-CM | POA: Diagnosis not present

## 2022-04-16 DIAGNOSIS — F0280 Dementia in other diseases classified elsewhere without behavioral disturbance: Secondary | ICD-10-CM

## 2022-04-16 DIAGNOSIS — G3109 Other frontotemporal dementia: Secondary | ICD-10-CM | POA: Insufficient documentation

## 2022-04-16 DIAGNOSIS — N1831 Chronic kidney disease, stage 3a: Secondary | ICD-10-CM

## 2022-04-16 HISTORY — DX: Pain in right shoulder: M25.511

## 2022-04-16 HISTORY — DX: Dementia in other diseases classified elsewhere, unspecified severity, without behavioral disturbance, psychotic disturbance, mood disturbance, and anxiety: F02.80

## 2022-04-16 MED ORDER — TRAMADOL HCL 50 MG PO TABS: 50.0000 mg | ORAL_TABLET | Freq: Three times a day (TID) | ORAL | 0 refills | Status: AC | PRN

## 2022-04-16 MED ORDER — AMLODIPINE BESYLATE 10 MG PO TABS: 10.0000 mg | ORAL_TABLET | Freq: Every day | ORAL | 3 refills | Status: DC

## 2022-04-16 NOTE — Assessment & Plan Note (Signed)
Followed by Neurology On Aricept now

## 2022-04-16 NOTE — Patient Instructions (Signed)
Please take Tramadol as needed for severe pain. Take Tylenol for mild to moderate pain.  Please continue taking other medications as prescribed.

## 2022-04-16 NOTE — Assessment & Plan Note (Signed)
Has right arm tenderness Could be biceps tendinitis and/or rotator cuff injury in addition to OA of right shoulder Unable to take oral NSAIDs due to CKD Tramadol as needed for severe pain Tylenol as needed for mild to moderate pain Tizanidine as needed for muscle spasms

## 2022-04-16 NOTE — Progress Notes (Signed)
Established Patient Office Visit  Subjective:  Patient ID: Elizabeth Mcintyre, female    DOB: [birth year]  Age: 81 y.o. MRN: [id number]  CC:  Chief Complaint  Patient presents with   Shoulder Pain    Patient having right shoulder pain went down back and to hand went to urgent care 7-27 was given prednisone this started 7-26    HPI Elizabeth Mcintyre is a 80 y.o. 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  She c/o right shoulder pain for the last 1 week.  Pain is constant, sharp, worse with movement, radiating to lower scapular border and mildly improved with Tylenol.  She denies any recent injury or fall.  She went to urgent care, and was given steroid injection followed by oral steroid and tizanidine.  Her pain did not improve much.  She is unable to take oral NSAIDs due to CKD.  Her BP was elevated in the office today.  Of note, she has run out of her amlodipine.  She has had mild, generalized headache and dizziness.  She denies any chest pain or palpitations.  She was recently diagnosed with frontotemporal dementia and has been placed on Aricept.  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   Lower GI bleeding 04/30/2020   Lymphadenopathy 01/23/2020   Osteopenia 2017   Last  bone density 05/04/2017: -2.4   PONV (postoperative nausea and vomiting)    Schatzki's ring    Stress incontinence    Stroke [facility])    mini stroke - found on a CT scan    Past Surgical History:  Procedure Laterality Date   ABDOMINAL HYSTERECTOMY     BALLOON DILATION N/A 08/21/2020   Procedure: BALLOON DILATION;  Surgeon: Eloise Harman, DO;  [facility];  Service: Endoscopy;  Laterality: N/A;   BIOPSY  03/30/2020   Procedure: BIOPSY;  Surgeon: Ronald Lobo, MD;  Location: [facility];  Service: Endoscopy;;   BIOPSY  05/02/2020   Procedure: BIOPSY;  Surgeon: Montez Morita, Quillian Quince, MD;  Location: [facility];  Service: Gastroenterology;;   CARDIAC CATHETERIZATION     X 2, last one in [place] 04/13/2017   Procedure: LAPAROSCOPIC CHOLECYSTECTOMY;  Surgeon: Aviva Signs, MD;  Location: [facility];  Service: General;  Laterality: N/A;   COLONOSCOPY     COLONOSCOPY  May 2012   Dr. Olevia Perches: mild diverticulosis, otherwise normal.    COLONOSCOPY WITH PROPOFOL N/A 05/02/2020   Dr. Jenetta Downer: 8 mm polyp removed from the  ascending colon, 2 mm polyp removed from the ascending colon.  Tubular adenomas.  Diverticulosis.  Mucosal ulceration in the sigmoid colon noted, pathology consistent with ischemic colitis.   ESOPHAGOGASTRODUODENOSCOPY N/A 01/28/2015   Dr. Gala Romney: reflux esophagitis, Schatzki's ring not manipulated due to recent bleeding   ESOPHAGOGASTRODUODENOSCOPY N/A 03/30/2015   Dr. Gala Romney: Schatzki's ring s/p Venia Minks dilation, previously noted esophageal ulcer completely healed   ESOPHAGOGASTRODUODENOSCOPY N/A 03/30/2020   Buccini: Moderately severe erosive, circumferential, confluent esophagitis with no bleeding found 25 to 40 cm from incisors.  Nonobstructing and mild Schatzki ring, there were also multiple  distal esophageal rings noted, minimal hiatal hernia.   ESOPHAGOGASTRODUODENOSCOPY (EGD) WITH PROPOFOL N/A 08/21/2020   Procedure: ESOPHAGOGASTRODUODENOSCOPY (EGD) WITH PROPOFOL;  Surgeon: Eloise Harman, DO;  [facility];  Service: Endoscopy;  Laterality: N/A;  2:15pm   IR IMAGING GUIDED PORT INSERTION  03/13/2020   IR REMOVAL TUN ACCESS W/ PORT W/O FL MOD SED  10/01/2021   LYMPH NODE BIOPSY Left 03/20/2020   Procedure: LEFT POSTERIOR CERVICAL LYMPH NODE BIOPSY;  Surgeon: Georganna Skeans, MD;  Location: [facility];  Service: General;  Laterality: Left;   MALONEY DILATION N/A 03/30/2015   Procedure: Keturah Shavers;  Surgeon: Daneil Dolin, MD;  [facility];  Service: Endoscopy;  Laterality: N/A;   POLYPECTOMY  05/02/2020   Procedure: POLYPECTOMY;  Surgeon: Montez Morita, Quillian Quince, MD;  [facility];  Service: Gastroenterology;;    Family History  Problem Relation Age of Onset   Heart disease Mother    Osteoarthritis Mother    Sudden death Father    Single kidney Father    Other Father        h/o severe MVA injuries   Hyperlipidemia Sister    Other Daughter        Myalgias   Fibromyalgia Daughter    Allergies Daughter    Heart disease Maternal Grandfather    Sudden death Paternal Grandmother    Diabetes Paternal Grandfather    Heart disease Daughter    Other Daughter        palpitations   Pulmonary fibrosis Maternal Aunt    Cancer Paternal Uncle    Pulmonary fibrosis Maternal Aunt    Colon cancer Neg Hx     Social History   Socioeconomic History   Marital status: Widowed    Spouse name: Not on file   Number of children: 2   Years of education: Not on file   Highest education level: Not on file  Occupational History   Occupation: retired  Tobacco Use   Smoking status: Never   Smokeless tobacco: Never  Vaping Use   Vaping Use: Never used  Substance and Sexual Activity   Alcohol use: No   Drug use: No   Sexual activity: Never  Other  Topics Concern   Not on file  Social History Narrative   [name] is widowed. Her young grandson lives with her, for whom she shares custody with her daughter, the son's aunt.     Right handed    Social Determinants of Health   Financial Resource Strain: Low Risk  (06/04/2021)   Overall Financial Resource Strain (CARDIA)    Difficulty of Paying Living Expenses: Not hard at all  Food Insecurity: No Food Insecurity (06/04/2021)   Hunger Vital Sign    Worried About Running Out of Food in the Last Year: Never true    Ran Out of Food in the Last Year: Never true  Transportation Needs: No Transportation Needs (06/04/2021)   PRAPARE - Hydrologist (Medical): No    Lack of Transportation (Non-Medical): No  Physical Activity: Insufficiently Active (06/04/2021)   Exercise Vital Sign    Days of Exercise per Week: 5 days    Minutes of Exercise per Session: 10 min  Stress: No Stress Concern Present (06/04/2021)   [employer]    Feeling of Stress : Only a little  Social Connections: Socially Isolated (06/04/2021)   Social Connection and Isolation Panel [NHANES]    Frequency of Communication with Friends and Family: More than three times a week    Frequency of Social Gatherings with Friends and Family: More than three times a week    Attends Religious Services: Never    Marine scientist or Organizations: No    Attends Archivist Meetings: Never    Marital Status: Widowed  Intimate Partner Violence: Not At Risk (06/04/2021)   Humiliation, Afraid, Rape, and Kick questionnaire    Fear of Current or Ex-Partner: No    Emotionally Abused: No    Physically Abused: No    Sexually Abused: No    Outpatient Medications Prior to Visit  Medication Sig Dispense Refill   acetaminophen (TYLENOL) 325 MG tablet Take 325 mg by mouth every 6 (six) hours as needed for moderate pain.      albuterol  (VENTOLIN HFA) 108 (90 Base) MCG/ACT inhaler Inhale 2 puffs into the lungs every 6 (six) hours as needed for wheezing. 8.5 g 2   Ascorbic Acid (VITAMIN C) 100 MG tablet Take 100 mg by mouth daily.     cetirizine (ZYRTEC) 10 MG tablet Take 10 mg by mouth at bedtime. As needed     Cholecalciferol (VITAMIN D3) 25 MCG (1000 UT) CAPS Take 1,000 Units by mouth daily.      donepezil (ARICEPT) 10 MG tablet TAKE 1 TABLET BY MOUTH DAILY 30 tablet 3   levothyroxine (SYNTHROID) 75 MCG tablet TAKE 1 TABLET(75 MCG) BY MOUTH DAILY 30 tablet 3   lidocaine-prilocaine (EMLA) cream Apply 1 application topically as needed. (Patient taking differently: Apply 1 application. topically as needed (port access).) 30 g 1   mirtazapine (REMERON) 7.5 MG tablet Take 1 tablet (7.5 mg total) by mouth at bedtime. 30 tablet 3   pantoprazole (PROTONIX) 40 MG tablet Take 1 tablet (40 mg total) by mouth daily. 90 tablet 3   potassium chloride SA (KLOR-CON) 20 MEQ tablet Take 1 tablet (20 mEq total) by mouth daily. 21 tablet 0   predniSONE (DELTASONE) 20 MG tablet Take 1 tablet (20 mg total) by mouth daily with breakfast for 5 days. 5 tablet 0   rOPINIRole (REQUIP) 0.25 MG tablet Take 1 tablet (0.25 mg total) by mouth at bedtime. 90 tablet 1   rosuvastatin (CRESTOR) 20 MG tablet Take 1 tablet (20 mg total) by mouth daily. 90 tablet 3   sucralfate (CARAFATE) 1 g tablet Take 1 g by mouth 4 (four) times daily -  with meals and at bedtime. As needed     tiZANidine (ZANAFLEX) 4 MG tablet Take 1 tablet (4 mg total) by mouth at bedtime as needed for muscle spasms. Do not take with alcohol or while driving or operating heavy machinery 30 tablet 0   vitamin B-12 (CYANOCOBALAMIN) 1000 MCG tablet Take 1,000 mcg by mouth daily.      amLODipine (NORVASC) 10 MG tablet Take 1 tablet (10  mg total) by mouth daily. (Patient not taking: Reported on 04/16/2022) 90 tablet 1   No facility-administered medications prior to visit.    Allergies  Allergen  Reactions   Codeine Phosphate Nausea And Vomiting and Rash    ROS Review of Systems  Constitutional:  Positive for appetite change and fatigue. Negative for chills and fever.  HENT:  Negative for congestion, sinus pressure, sinus pain and sore throat.   Eyes:  Positive for visual disturbance. Negative for pain and discharge.  Respiratory:  Negative for cough and shortness of breath.   Cardiovascular:  Negative for chest pain and palpitations.  Gastrointestinal:  Negative for abdominal pain, constipation, nausea and vomiting.  Endocrine: Negative for polydipsia and polyuria.  Genitourinary:  Negative for dysuria and hematuria.  Musculoskeletal:  Positive for arthralgias (Right shoulder). Negative for neck pain and neck stiffness.  Skin:  Negative for rash.  Neurological:  Positive for dizziness and headaches. Negative for weakness.  Psychiatric/Behavioral:  Positive for dysphoric mood and sleep disturbance. Negative for agitation and behavioral problems.       Objective:    Physical Exam Vitals reviewed.  Constitutional:      General: She is not in acute distress.    Appearance: She is not diaphoretic.  HENT:     Head: Normocephalic and atraumatic.     Nose: Nose normal.     Mouth/Throat:     Mouth: Mucous membranes are moist.  Eyes:     General: No scleral icterus.    Extraocular Movements: Extraocular movements intact.  Cardiovascular:     Rate and Rhythm: Normal rate and regular rhythm.     Pulses: Normal pulses.     Heart sounds: Normal heart sounds. No murmur heard. Pulmonary:     Breath sounds: Normal breath sounds. No wheezing or rales.  Musculoskeletal:        General: Tenderness (Right shoulder and upper arm) present.     Cervical back: Neck supple. No tenderness.     Right lower leg: No edema.     Left lower leg: No edema.     Comments: ROM at right shoulder limited due to pain  Skin:    General: Skin is warm.     Findings: No rash.  Neurological:      General: No focal deficit present.     Mental Status: She is alert and oriented to person, place, and time.     Sensory: No sensory deficit.     Motor: Weakness (B/l LE) present.  Psychiatric:        Mood and Affect: Mood normal.        Behavior: Behavior normal.     BP (!) 142/92 (BP Location: Left Arm, Cuff Size: Normal)   Pulse 61   Resp 16   Ht _0  (1.575 m)   Wt 130 lb 6.4 oz (59.1 kg)   SpO2 98%   BMI 23.85 kg/m  Wt Readings from Last 3 Encounters:  04/16/22 130 lb 6.4 oz (59.1 kg)  02/11/22 127 lb (57.6 kg)  01/31/22 126 lb 4.8 oz (57.3 kg)    Lab Results  Component Value Date   TSH 0.622 07/22/2021   Lab Results  Component Value Date   WBC 9.5 01/31/2022   HGB 14.3 01/31/2022   HCT 40.8 01/31/2022   MCV 86.4 01/31/2022   PLT 211 01/31/2022   Lab Results  Component Value Date   NA 141 01/31/2022   K 4.4 01/31/2022   CO2 28  01/31/2022   GLUCOSE 102 (H) 01/31/2022   BUN 30 (H) 01/31/2022   CREATININE 1.46 (H) 01/31/2022   BILITOT 0.9 01/31/2022   ALKPHOS 80 01/31/2022   AST 22 01/31/2022   ALT 15 01/31/2022   PROT 7.4 01/31/2022   ALBUMIN 4.5 01/31/2022   CALCIUM 9.6 01/31/2022   ANIONGAP 8 01/31/2022   EGFR 59 (L) 07/22/2021   GFR 33.87 (L) 12/29/2019   Lab Results  Component Value Date   CHOL 176 06/08/2019   Lab Results  Component Value Date   HDL 36 (L) 06/08/2019   Lab Results  Component Value Date   LDLCALC 94 06/08/2019   Lab Results  Component Value Date   TRIG 347 (H) 06/08/2019   Lab Results  Component Value Date   CHOLHDL 4.9 06/08/2019   Lab Results  Component Value Date   HGBA1C 4.9 11/13/2020      Assessment & Plan:   Problem List Items Addressed This Visit       Cardiovascular and Mediastinum   Essential hypertension, benign    BP Readings from Last 1 Encounters:  04/16/22 (!) 142/92  Has not taken Amlodipine today Overall well-controlled with Amlodipine 10 mg QD in the past Counseled for compliance with  the medications Advised DASH diet      Relevant Medications   amLODipine (NORVASC) 10 MG tablet     Nervous and Auditory   Frontotemporal dementia ([place])    Followed by Neurology On Aricept now        Genitourinary   Stage 3a chronic kidney disease ([place])    Check CMP - needs to improve hydration Avoid nephrotoxic agents        Other   Acute pain of right shoulder - Primary    Has right arm tenderness Could be biceps tendinitis and/or rotator cuff injury in addition to OA of right shoulder Unable to take oral NSAIDs due to CKD Tramadol as needed for severe pain Tylenol as needed for mild to moderate pain Tizanidine as needed for muscle spasms      Relevant Medications   traMADol (ULTRAM) 50 MG tablet   Other Relevant Orders   Ambulatory referral to Orthopedic Surgery    Meds ordered this encounter  Medications   amLODipine (NORVASC) 10 MG tablet    Sig: Take 1 tablet (10 mg total) by mouth daily.    Dispense:  90 tablet    Refill:  3   traMADol (ULTRAM) 50 MG tablet    Sig: Take 1 tablet (50 mg total) by mouth every 8 (eight) hours as needed for up to 5 days.    Dispense:  15 tablet    Refill:  0    Follow-up: Return in about 3 months (around 07/17/2022).    Lindell Spar, MD

## 2022-04-16 NOTE — Assessment & Plan Note (Signed)
Check CMP - needs to improve hydration Avoid nephrotoxic agents 

## 2022-04-16 NOTE — Assessment & Plan Note (Signed)
BP Readings from Last 1 Encounters:  04/16/22 (!) 142/92   Has not taken Amlodipine today Overall well-controlled with Amlodipine 10 mg QD in the past Counseled for compliance with the medications Advised DASH diet

## 2022-04-22 ENCOUNTER — Encounter: Payer: Self-pay | Admitting: Orthopaedic Surgery

## 2022-04-22 ENCOUNTER — Ambulatory Visit (INDEPENDENT_AMBULATORY_CARE_PROVIDER_SITE_OTHER): Payer: Medicare HMO | Admitting: Orthopaedic Surgery

## 2022-04-22 DIAGNOSIS — G3109 Other frontotemporal dementia: Secondary | ICD-10-CM

## 2022-04-22 DIAGNOSIS — M25511 Pain in right shoulder: Secondary | ICD-10-CM | POA: Diagnosis not present

## 2022-04-22 DIAGNOSIS — G8929 Other chronic pain: Secondary | ICD-10-CM

## 2022-04-22 DIAGNOSIS — F0280 Dementia in other diseases classified elsewhere without behavioral disturbance: Secondary | ICD-10-CM | POA: Diagnosis not present

## 2022-04-22 NOTE — Progress Notes (Signed)
Subjective:    Patient ID: Elizabeth Mcintyre, female    DOB: Aug 31, 1941, 81 y.o.   MRN: [id number]  HPI She has pain of the right shoulder that has been present since 04-09-22, maybe longer. She went to Urgent Care on 04-11-22 and was evaluated. X-rays were done.  She has history of recent early dementia.  She is accompanied by daughter who gave most of the history.  I have reviewed the Urgent Care records and X-rays.  I have independently reviewed and interpreted x-rays of this patient done at another site by another physician or qualified health professional.  She was then seen at [facility] on 04-16-22.  I have reviewed those notes a well.  Her shoulder is a little better.  She has no swelling, no redness, no paresthesias.  It hurts to lift overhead and sleep on it.  She has no trauma.  She is better with Tylenol and ice.    Review of Systems  Constitutional:  Positive for activity change.  Respiratory:  Positive for shortness of breath.   Musculoskeletal:  Positive for arthralgias.  All other systems reviewed and are negative. For Review of Systems, all other systems reviewed and are negative.  The following is a summary of the past history medically, past history surgically, known current medicines, social history and family history.  This information is gathered electronically by the computer from prior information and documentation.  I review this each visit and have found including this information at this point in the chart is beneficial and informative.   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   Lower GI bleeding 04/30/2020   Lymphadenopathy 01/23/2020   Osteopenia 2017   Last  bone density 05/04/2017: -2.4   PONV (postoperative nausea and vomiting)    Schatzki's ring    Stress incontinence    Stroke [facility])    mini stroke - found on a CT scan    Past Surgical History:  Procedure Laterality Date   ABDOMINAL HYSTERECTOMY     BALLOON DILATION N/A 08/21/2020   Procedure: BALLOON DILATION;  Surgeon: Eloise Harman, DO;  Location: [facility];  Service: Endoscopy;  Laterality: N/A;   BIOPSY  03/30/2020   Procedure: BIOPSY;  Surgeon: Ronald Lobo, MD;  Location: [facility];  Service: Endoscopy;;   BIOPSY  05/02/2020   Procedure: BIOPSY;  Surgeon: Montez Morita, Quillian Quince, MD;  Location: [facility];  Service: Gastroenterology;;   CARDIAC CATHETERIZATION     X 2, last one in [place] 04/13/2017   Procedure: LAPAROSCOPIC CHOLECYSTECTOMY;  Surgeon: Aviva Signs, MD;  Location: [facility];  Service: General;  Laterality: N/A;  COLONOSCOPY     COLONOSCOPY  May 2012   Dr. Olevia Perches: mild diverticulosis, otherwise normal.    COLONOSCOPY WITH PROPOFOL N/A 05/02/2020   Dr. Jenetta Downer: 8 mm polyp removed from the ascending colon, 2 mm polyp removed from the ascending colon.  Tubular adenomas.  Diverticulosis.  Mucosal ulceration in the sigmoid colon noted, pathology consistent with ischemic colitis.   ESOPHAGOGASTRODUODENOSCOPY N/A 01/28/2015   Dr. Gala Romney: reflux esophagitis, Schatzki's ring not manipulated due to recent bleeding   ESOPHAGOGASTRODUODENOSCOPY N/A 03/30/2015   Dr. Gala Romney: Schatzki's ring s/p Venia Minks dilation, previously noted esophageal ulcer completely healed    ESOPHAGOGASTRODUODENOSCOPY N/A 03/30/2020   Buccini: Moderately severe erosive, circumferential, confluent esophagitis with no bleeding found 25 to 40 cm from incisors.  Nonobstructing and mild Schatzki ring, there were also multiple distal esophageal rings noted, minimal hiatal hernia.   ESOPHAGOGASTRODUODENOSCOPY (EGD) WITH PROPOFOL N/A 08/21/2020   Procedure: ESOPHAGOGASTRODUODENOSCOPY (EGD) WITH PROPOFOL;  Surgeon: Eloise Harman, DO;  [facility];  Service: Endoscopy;  Laterality: N/A;  2:15pm   IR IMAGING GUIDED PORT INSERTION  03/13/2020   IR REMOVAL TUN ACCESS W/ PORT W/O FL MOD SED  10/01/2021   LYMPH NODE BIOPSY Left 03/20/2020   Procedure: LEFT POSTERIOR CERVICAL LYMPH NODE BIOPSY;  Surgeon: Georganna Skeans, MD;  Location: [place];  Service: General;  Laterality: Left;   MALONEY DILATION N/A 03/30/2015   Procedure: Keturah Shavers;  Surgeon: Daneil Dolin, MD;  [facility];  Service: Endoscopy;  Laterality: N/A;   POLYPECTOMY  05/02/2020   Procedure: POLYPECTOMY;  Surgeon: Montez Morita, Quillian Quince, MD;  Location[facility];  Service: Gastroenterology;;    Current Outpatient Medications on File Prior to Visit  Medication Sig Dispense Refill   acetaminophen (TYLENOL) 325 MG tablet Take 325 mg by mouth every 6 (six) hours as needed for moderate pain.      amLODipine (NORVASC) 10 MG tablet Take 1 tablet (10 mg total) by mouth daily. 90 tablet 3   Ascorbic Acid (VITAMIN C) 100 MG tablet Take 100 mg by mouth daily.     cetirizine (ZYRTEC) 10 MG tablet Take 10 mg by mouth at bedtime. As needed     Cholecalciferol (VITAMIN D3) 25 MCG (1000 UT) CAPS Take 1,000 Units by mouth daily.      donepezil (ARICEPT) 10 MG tablet TAKE 1 TABLET BY MOUTH DAILY 30 tablet 3   levothyroxine (SYNTHROID) 75 MCG tablet TAKE 1 TABLET(75 MCG) BY MOUTH DAILY 30 tablet 3   lidocaine-prilocaine (EMLA) cream Apply 1 application topically as needed. (Patient taking differently: Apply 1  application  topically as needed (port access).) 30 g 1   mirtazapine (REMERON) 7.5 MG tablet Take 1 tablet (7.5 mg total) by mouth at bedtime. 30 tablet 3   potassium chloride SA (KLOR-CON) 20 MEQ tablet Take 1 tablet (20 mEq total) by mouth daily. 21 tablet 0   rOPINIRole (REQUIP) 0.25 MG tablet Take 1 tablet (0.25 mg total) by mouth at bedtime. 90 tablet 1   rosuvastatin (CRESTOR) 20 MG tablet Take 1 tablet (20 mg total) by mouth daily. 90 tablet 3   sucralfate (CARAFATE) 1 g tablet Take 1 g by mouth 4 (four) times daily -  with meals and at bedtime. As needed     tiZANidine (ZANAFLEX) 4 MG tablet Take 1 tablet (4 mg total) by mouth at bedtime as needed for muscle spasms. Do not take with alcohol or while driving or operating heavy machinery 30 tablet 0  vitamin B-12 (CYANOCOBALAMIN) 1000 MCG tablet Take 1,000 mcg by mouth daily.      albuterol (VENTOLIN HFA) 108 (90 Base) MCG/ACT inhaler Inhale 2 puffs into the lungs every 6 (six) hours as needed for wheezing. 8.5 g 2   pantoprazole (PROTONIX) 40 MG tablet Take 1 tablet (40 mg total) by mouth daily. 90 tablet 3   No current facility-administered medications on file prior to visit.    Social History   Socioeconomic History   Marital status: Widowed    Spouse name: Not on file   Number of children: 2   Years of education: Not on file   Highest education level: Not on file  Occupational History   Occupation: retired  Tobacco Use   Smoking status: Never   Smokeless tobacco: Never  Vaping Use   Vaping Use: Never used  Substance and Sexual Activity   Alcohol use: No   Drug use: No   Sexual activity: Never  Other Topics Concern   Not on file  Social History Narrative   Ms. Hantz is widowed. Her young grandson lives with her, for whom she shares custody with her daughter, the son's aunt.     Right handed    Social Determinants of Health   Financial Resource Strain: Low Risk  (06/04/2021)   Overall Financial Resource Strain  (CARDIA)    Difficulty of Paying Living Expenses: Not hard at all  Food Insecurity: No Food Insecurity (06/04/2021)   Hunger Vital Sign    Worried About Running Out of Food in the Last Year: Never true    Ran Out of Food in the Last Year: Never true  Transportation Needs: No Transportation Needs (06/04/2021)   PRAPARE - Hydrologist (Medical): No    Lack of Transportation (Non-Medical): No  Physical Activity: Insufficiently Active (06/04/2021)   Exercise Vital Sign    Days of Exercise per Week: 5 days    Minutes of Exercise per Session: 10 min  Stress: No Stress Concern Present (06/04/2021)   [place]    Feeling of Stress : Only a little  Social Connections: Socially Isolated (06/04/2021)   Social Connection and Isolation Panel [NHANES]    Frequency of Communication with Friends and Family: More than three times a week    Frequency of Social Gatherings with Friends and Family: More than three times a week    Attends Religious Services: Never    Marine scientist or Organizations: No    Attends Archivist Meetings: Never    Marital Status: Widowed  Intimate Partner Violence: Not At Risk (06/04/2021)   Humiliation, Afraid, Rape, and Kick questionnaire    Fear of Current or Ex-Partner: No    Emotionally Abused: No    Physically Abused: No    Sexually Abused: No    Family History  Problem Relation Age of Onset   Heart disease Mother    Osteoarthritis Mother    Sudden death Father    Single kidney Father    Other Father        h/o severe MVA injuries   Hyperlipidemia Sister    Other Daughter        Myalgias   Fibromyalgia Daughter    Allergies Daughter    Heart disease Maternal Grandfather    Sudden death Paternal Grandmother    Diabetes Paternal Grandfather    Heart disease Daughter    Other Daughter  palpitations   Pulmonary fibrosis Maternal Aunt    Cancer  Paternal Uncle    Pulmonary fibrosis Maternal Aunt    Colon cancer Neg Hx     There were no vitals taken for this visit.  There is no height or weight on file to calculate BMI.      Objective:   Physical Exam Vitals and nursing note reviewed. Exam conducted with a chaperone present.  Constitutional:      Appearance: She is well-developed.  HENT:     Head: Normocephalic and atraumatic.  Eyes:     Conjunctiva/sclera: Conjunctivae normal.     Pupils: Pupils are equal, round, and reactive to light.  Cardiovascular:     Rate and Rhythm: Normal rate and regular rhythm.  Pulmonary:     Effort: Pulmonary effort is normal.  Abdominal:     Palpations: Abdomen is soft.  Musculoskeletal:       Arms:     Cervical back: Normal range of motion and neck supple.  Skin:    General: Skin is warm and dry.  Neurological:     Mental Status: She is alert and oriented to person, place, and time.     Cranial Nerves: No cranial nerve deficit.     Motor: No abnormal muscle tone.     Coordination: Coordination normal.     Deep Tendon Reflexes: Reflexes are normal and symmetric. Reflexes normal.  Psychiatric:        Behavior: Behavior normal.        Thought Content: Thought content normal.        Judgment: Judgment normal.           Assessment & Plan:   Encounter Diagnoses  Name Primary?   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 Some pain may come from neck and if this does not help, consider X-rays of neck.  She does not take her medicine regularly and another reason for injection.  PROCEDURE NOTE:  The patient request injection, verbal consent was obtained.  The right shoulder was prepped appropriately after time out was performed.   Sterile technique was observed and injection of 1 cc of DepoMedrol '40mg'$  with several cc's of plain xylocaine. Anesthesia was provided by ethyl chloride and a 20-gauge  needle was used to inject the shoulder area. A posterior approach was used.  The injection was tolerated well.  A band aid dressing was applied.  The patient was advised to apply ice later today and tomorrow to the injection sight as needed.  Return in three weeks.  Call if any problem.  Precautions discussed.  Electronically Signed Sanjuana Kava, MD 8/8/20233:42 PM

## 2022-05-05 ENCOUNTER — Ambulatory Visit (INDEPENDENT_AMBULATORY_CARE_PROVIDER_SITE_OTHER): Payer: Medicare HMO | Admitting: Internal Medicine

## 2022-05-05 ENCOUNTER — Encounter: Payer: Self-pay | Admitting: Internal Medicine

## 2022-05-05 VITALS — BP 152/86 | HR 95 | Ht 61.0 in | Wt 131.2 lb

## 2022-05-05 DIAGNOSIS — I10 Essential (primary) hypertension: Secondary | ICD-10-CM

## 2022-05-05 DIAGNOSIS — Z0001 Encounter for general adult medical examination with abnormal findings: Secondary | ICD-10-CM | POA: Diagnosis not present

## 2022-05-05 DIAGNOSIS — G2581 Restless legs syndrome: Secondary | ICD-10-CM

## 2022-05-05 DIAGNOSIS — N1831 Chronic kidney disease, stage 3a: Secondary | ICD-10-CM

## 2022-05-05 DIAGNOSIS — C8580 Other specified types of non-Hodgkin lymphoma, unspecified site: Secondary | ICD-10-CM

## 2022-05-05 DIAGNOSIS — E782 Mixed hyperlipidemia: Secondary | ICD-10-CM | POA: Diagnosis not present

## 2022-05-05 DIAGNOSIS — R7303 Prediabetes: Secondary | ICD-10-CM

## 2022-05-05 DIAGNOSIS — R300 Dysuria: Secondary | ICD-10-CM

## 2022-05-05 DIAGNOSIS — E559 Vitamin D deficiency, unspecified: Secondary | ICD-10-CM | POA: Diagnosis not present

## 2022-05-05 DIAGNOSIS — E039 Hypothyroidism, unspecified: Secondary | ICD-10-CM

## 2022-05-05 DIAGNOSIS — F339 Major depressive disorder, recurrent, unspecified: Secondary | ICD-10-CM

## 2022-05-05 DIAGNOSIS — N3000 Acute cystitis without hematuria: Secondary | ICD-10-CM

## 2022-05-05 MED ORDER — MIRTAZAPINE 7.5 MG PO TABS: 7.5000 mg | ORAL_TABLET | Freq: Every day | ORAL | 5 refills | Status: DC

## 2022-05-05 MED ORDER — ROPINIROLE HCL 0.25 MG PO TABS: 0.2500 mg | ORAL_TABLET | Freq: Every day | ORAL | 1 refills | Status: DC

## 2022-05-05 MED ORDER — ROSUVASTATIN CALCIUM 20 MG PO TABS: 20.0000 mg | ORAL_TABLET | Freq: Every day | ORAL | 3 refills | Status: DC

## 2022-05-05 NOTE — Patient Instructions (Signed)
Please take medications as prescribed.  Please consider getting Shingrix and Tdap vaccines at local pharmacy.  Please bring home medications in the next visit.

## 2022-05-05 NOTE — Assessment & Plan Note (Signed)
Check CMP - needs to improve hydration Avoid nephrotoxic agents 

## 2022-05-05 NOTE — Progress Notes (Signed)
Established Patient Office Visit  Subjective:  Patient ID: Elizabeth Mcintyre, female    DOB: [birth year]  Age: 81 y.o. MRN: [id number]  CC:  Chief Complaint  Patient presents with   Annual Exam    HPI Elizabeth Mcintyre is a 81 y.o. 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  HTN: Her BP was mildly elevated today, but she has not had her amlodipine today.Patient denies headache, dizziness, chest pain, dyspnea or palpitations.   Hypothyroidism: Takes Levothyroxine, but not consistently. Denies any tremors or palpitations.  Due for TSH and free T4 testing.  She also reports insomnia and depressed mood at times.  Denies anxiety, SI or HI currently.  She has not been taking Remeron for insomnia as well.  She complains of dysuria, foul-smelling urine and dark urine for the last few weeks. She denies any fever, chills, nausea or vomiting currently.  Denies any hematuria or urinary hesitancy or resistance.  Her daughter states that she is going to get bubble wrap packaging for her medications for better compliance.  She wants to wait for CCM referral for now.  Past Medical History:  Diagnosis Date   Asthma    Bloating 04/26/2019   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   Cancer ([place]) 2021   Lymphoma   Chronic SI joint pain    was on tramadol   Depression with anxiety 04/03/2011   DIABETES MELLITUS, TYPE II 11/09/2007   diet control   Diverticulosis 03/2011   Fecal incontinence 07/07/2019   GERD (gastroesophageal reflux disease)    none recently   GI bleed    Gout 06/09/2014   R great toe, ball of foot Stopped HCTZ several mos ago,  On losartan        Hematemesis 03/30/2020   History of rheumatoid arthritis    during 30's, was treated.   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   Lower GI bleeding 04/30/2020   Lymphadenopathy 01/23/2020   Osteopenia 2017   Last  bone density 05/04/2017: -2.4   PONV (postoperative nausea and vomiting)    Schatzki's ring    Stress incontinence    Stroke [facility])    mini stroke - found on a CT scan    Past Surgical History:  Procedure Laterality Date   ABDOMINAL HYSTERECTOMY     BALLOON DILATION N/A 08/21/2020   Procedure: BALLOON DILATION;  Surgeon: Eloise Harman, DO;  [facility];  Service: Endoscopy;  Laterality: N/A;   BIOPSY  03/30/2020   Procedure: BIOPSY;  Surgeon: Ronald Lobo, MD;  [facility]: [facility];  Service: Endoscopy;;   BIOPSY  05/02/2020   Procedure: BIOPSY;  Surgeon: Montez Morita, Quillian Quince, MD;  Location: [facility];  Service: Gastroenterology;;   CARDIAC CATHETERIZATION     X 2, last one in [place] 04/13/2017   Procedure: LAPAROSCOPIC CHOLECYSTECTOMY;  Surgeon: Aviva Signs, MD;  Location: [facility];  Service: General;  Laterality: N/A;   COLONOSCOPY     COLONOSCOPY  May 2012   Dr. Olevia Perches: mild diverticulosis, otherwise normal.    COLONOSCOPY WITH PROPOFOL N/A 05/02/2020   Dr. Jenetta Downer: 8 mm polyp removed from the ascending colon, 2 mm polyp removed from the ascending colon.  Tubular adenomas.  Diverticulosis.  Mucosal ulceration in the sigmoid colon noted, pathology consistent with ischemic colitis.   ESOPHAGOGASTRODUODENOSCOPY N/A 01/28/2015   Dr. Gala Romney: reflux esophagitis, Schatzki's ring not manipulated due to recent bleeding   ESOPHAGOGASTRODUODENOSCOPY N/A 03/30/2015   Dr. Gala Romney: Schatzki's ring s/p Venia Minks dilation, previously noted esophageal ulcer completely healed   ESOPHAGOGASTRODUODENOSCOPY N/A 03/30/2020   Buccini: Moderately severe erosive, circumferential, confluent esophagitis with no bleeding found 25 to 40 cm from incisors.  Nonobstructing and mild Schatzki ring, there were also multiple distal esophageal  rings noted, minimal hiatal hernia.   ESOPHAGOGASTRODUODENOSCOPY (EGD) WITH PROPOFOL N/A 08/21/2020   Procedure: ESOPHAGOGASTRODUODENOSCOPY (EGD) WITH PROPOFOL;  Surgeon: Eloise Harman, DO;  [facility];  Service: Endoscopy;  Laterality: N/A;  2:15pm   IR IMAGING GUIDED PORT INSERTION  03/13/2020   IR REMOVAL TUN ACCESS W/ PORT W/O FL MOD SED  10/01/2021   LYMPH NODE BIOPSY Left 03/20/2020   Procedure: LEFT POSTERIOR CERVICAL LYMPH NODE BIOPSY;  Surgeon: Georganna Skeans, MD;  Location: [place];  Service: General;  Laterality: Left;   MALONEY DILATION N/A 03/30/2015   Procedure: Keturah Shavers;  Surgeon: Daneil Dolin, MD;  [facility];  Service: Endoscopy;  Laterality: N/A;   POLYPECTOMY  05/02/2020   Procedure: POLYPECTOMY;  Surgeon: Montez Morita, Quillian Quince, MD;  [facility];  Service: Gastroenterology;;    Family History  Problem Relation Age of Onset   Heart disease Mother    Osteoarthritis Mother    Sudden death Father    Single kidney Father    Other Father        h/o severe MVA injuries   Hyperlipidemia Sister    Other Daughter        Myalgias   Fibromyalgia Daughter    Allergies Daughter    Heart disease Maternal Grandfather    Sudden death Paternal Grandmother    Diabetes Paternal Grandfather    Heart disease Daughter    Other Daughter        palpitations   Pulmonary fibrosis Maternal Aunt    Cancer Paternal Uncle    Pulmonary fibrosis Maternal Aunt    Colon cancer Neg Hx     Social History   Socioeconomic History   Marital status: Widowed    Spouse name: Not on file   Number of children: 2   Years of education: Not on file   Highest education level: Not on file  Occupational History   Occupation: retired  Tobacco Use   Smoking status: Never   Smokeless tobacco: Never  Vaping Use   Vaping Use: Never used  Substance and Sexual Activity   Alcohol use: No   Drug use: No   Sexual activity: Never  Other Topics Concern    Not on file  Social History Narrative   Ms. Murcia is widowed. Her young grandson lives with her, for whom she shares custody with her daughter, the son's aunt.     Right handed    Social Determinants of Health   Financial Resource Strain: Low Risk  (06/04/2021)   Overall Financial Resource Strain (CARDIA)    Difficulty of Paying Living Expenses: Not hard at all  Food Insecurity: No Food Insecurity (06/04/2021)   Hunger Vital Sign    Worried About Running Out of Food in the Last Year: Never true    Ran Out of Food in the Last Year: Never true  Transportation Needs: No Transportation Needs (06/04/2021)   PRAPARE - Transportation    Lack  of Transportation (Medical): No    Lack of Transportation (Non-Medical): No  Physical Activity: Insufficiently Active (06/04/2021)   Exercise Vital Sign    Days of Exercise per Week: 5 days    Minutes of Exercise per Session: 10 min  Stress: No Stress Concern Present (06/04/2021)   [place]    Feeling of Stress : Only a little  Social Connections: Socially Isolated (06/04/2021)   Social Connection and Isolation Panel [NHANES]    Frequency of Communication with Friends and Family: More than three times a week    Frequency of Social Gatherings with Friends and Family: More than three times a week    Attends Religious Services: Never    Marine scientist or Organizations: No    Attends Archivist Meetings: Never    Marital Status: Widowed  Intimate Partner Violence: Not At Risk (06/04/2021)   Humiliation, Afraid, Rape, and Kick questionnaire    Fear of Current or Ex-Partner: No    Emotionally Abused: No    Physically Abused: No    Sexually Abused: No    Outpatient Medications Prior to Visit  Medication Sig Dispense Refill   acetaminophen (TYLENOL) 325 MG tablet Take 325 mg by mouth every 6 (six) hours as needed for moderate pain.      amLODipine (NORVASC) 10 MG tablet  Take 1 tablet (10 mg total) by mouth daily. 90 tablet 3   Ascorbic Acid (VITAMIN C) 100 MG tablet Take 100 mg by mouth daily.     cetirizine (ZYRTEC) 10 MG tablet Take 10 mg by mouth at bedtime. As needed     Cholecalciferol (VITAMIN D3) 25 MCG (1000 UT) CAPS Take 1,000 Units by mouth daily.      donepezil (ARICEPT) 10 MG tablet TAKE 1 TABLET BY MOUTH DAILY 30 tablet 3   levothyroxine (SYNTHROID) 75 MCG tablet TAKE 1 TABLET(75 MCG) BY MOUTH DAILY 30 tablet 3   lidocaine-prilocaine (EMLA) cream Apply 1 application topically as needed. (Patient taking differently: Apply 1 application  topically as needed (port access).) 30 g 1   pantoprazole (PROTONIX) 40 MG tablet TAKE 1 TABLET(40 MG) BY MOUTH DAILY 90 tablet 3   potassium chloride SA (KLOR-CON) 20 MEQ tablet Take 1 tablet (20 mEq total) by mouth daily. 21 tablet 0   sucralfate (CARAFATE) 1 g tablet Take 1 g by mouth 4 (four) times daily -  with meals and at bedtime. As needed     tiZANidine (ZANAFLEX) 4 MG tablet Take 1 tablet (4 mg total) by mouth at bedtime as needed for muscle spasms. Do not take with alcohol or while driving or operating heavy machinery 30 tablet 0   vitamin B-12 (CYANOCOBALAMIN) 1000 MCG tablet Take 1,000 mcg by mouth daily.      mirtazapine (REMERON) 7.5 MG tablet Take 1 tablet (7.5 mg total) by mouth at bedtime. 30 tablet 3   rOPINIRole (REQUIP) 0.25 MG tablet Take 1 tablet (0.25 mg total) by mouth at bedtime. 90 tablet 1   rosuvastatin (CRESTOR) 20 MG tablet Take 1 tablet (20 mg total) by mouth daily. 90 tablet 3   albuterol (VENTOLIN HFA) 108 (90 Base) MCG/ACT inhaler Inhale 2 puffs into the lungs every 6 (six) hours as needed for wheezing. 8.5 g 2   No facility-administered medications prior to visit.    Allergies  Allergen Reactions   Codeine Phosphate Nausea And Vomiting and Rash    ROS Review of Systems  Constitutional:  Positive for fatigue. Negative for chills and fever.  HENT:  Negative for congestion,  sinus pressure, sinus pain and sore throat.   Eyes:  Positive for visual disturbance. Negative for pain and discharge.  Respiratory:  Negative for cough and shortness of breath.   Cardiovascular:  Negative for chest pain and palpitations.  Gastrointestinal:  Negative for abdominal pain, constipation, nausea and vomiting.  Endocrine: Negative for polydipsia and polyuria.  Genitourinary:  Positive for dysuria. Negative for hematuria.  Musculoskeletal:  Positive for arthralgias (Right shoulder). Negative for neck pain and neck stiffness.  Skin:  Negative for rash.  Neurological:  Positive for dizziness. Negative for weakness.  Psychiatric/Behavioral:  Positive for dysphoric mood and sleep disturbance. Negative for agitation and behavioral problems.       Objective:    Physical Exam Vitals reviewed.  Constitutional:      General: She is not in acute distress.    Appearance: She is not diaphoretic.  HENT:     Head: Normocephalic and atraumatic.     Nose: Nose normal.     Mouth/Throat:     Mouth: Mucous membranes are moist.  Eyes:     General: No scleral icterus.    Extraocular Movements: Extraocular movements intact.  Cardiovascular:     Rate and Rhythm: Normal rate and regular rhythm.     Pulses: Normal pulses.     Heart sounds: Normal heart sounds. No murmur heard. Pulmonary:     Breath sounds: Normal breath sounds. No wheezing or rales.  Abdominal:     Palpations: Abdomen is soft.     Tenderness: There is no abdominal tenderness.  Musculoskeletal:        General: Tenderness (Right shoulder and upper arm) present.     Cervical back: Neck supple. No tenderness.     Right lower leg: No edema.     Left lower leg: No edema.     Comments: ROM at right shoulder limited due to pain  Skin:    General: Skin is warm.     Findings: No rash.  Neurological:     General: No focal deficit present.     Mental Status: She is alert and oriented to person, place, and time.     Sensory: No  sensory deficit.     Motor: Weakness (B/l LE) present.  Psychiatric:        Mood and Affect: Mood normal.        Behavior: Behavior normal.     BP (!) 152/86 (BP Location: Right Arm)   Pulse 95   Ht 5' 1"  (1.549 m)   Wt 131 lb 3.2 oz (59.5 kg)   SpO2 96%   BMI 24.79 kg/m  Wt Readings from Last 3 Encounters:  05/05/22 131 lb 3.2 oz (59.5 kg)  04/16/22 130 lb 6.4 oz (59.1 kg)  02/11/22 127 lb (57.6 kg)    Lab Results  Component Value Date   TSH 0.622 07/22/2021   Lab Results  Component Value Date   WBC 9.5 01/31/2022   HGB 14.3 01/31/2022   HCT 40.8 01/31/2022   MCV 86.4 01/31/2022   PLT 211 01/31/2022   Lab Results  Component Value Date   NA 141 01/31/2022   K 4.4 01/31/2022   CO2 28 01/31/2022   GLUCOSE 102 (H) 01/31/2022   BUN 30 (H) 01/31/2022   CREATININE 1.46 (H) 01/31/2022   BILITOT 0.9 01/31/2022   ALKPHOS 80 01/31/2022   AST 22 01/31/2022   ALT  15 01/31/2022   PROT 7.4 01/31/2022   ALBUMIN 4.5 01/31/2022   CALCIUM 9.6 01/31/2022   ANIONGAP 8 01/31/2022   EGFR 59 (L) 07/22/2021   GFR 33.87 (L) 12/29/2019   Lab Results  Component Value Date   CHOL 176 06/08/2019   Lab Results  Component Value Date   HDL 36 (L) 06/08/2019   Lab Results  Component Value Date   LDLCALC 94 06/08/2019   Lab Results  Component Value Date   TRIG 347 (H) 06/08/2019   Lab Results  Component Value Date   CHOLHDL 4.9 06/08/2019   Lab Results  Component Value Date   HGBA1C 4.9 11/13/2020      Assessment & Plan:   Problem List Items Addressed This Visit       Cardiovascular and Mediastinum   Essential hypertension, benign    BP Readings from Last 1 Encounters:  05/05/22 (!) 152/86  Has not taken Amlodipine today Overall well-controlled with Amlodipine 10 mg QD in the past Counseled for compliance with the medications Advised DASH diet      Relevant Medications   rosuvastatin (CRESTOR) 20 MG tablet   Other Relevant Orders   CMP14+EGFR   CBC with  Differential/Platelet     Endocrine   Acquired hypothyroidism    Lab Results  Component Value Date   TSH 0.622 07/22/2021  On Levothyroxine 75 mcg QD, but needs to be compliant Will check TSH and free T4      Relevant Orders   TSH + free T4     Genitourinary   Stage 3a chronic kidney disease ([place])    Check CMP - needs to improve hydration Avoid nephrotoxic agents      Relevant Orders   CMP14+EGFR     Other   Major depression, recurrent, chronic ([place])    [place] Office Visit from 04/16/2022 in [facility]  PHQ-9 Total Score 0  Also reports insomnia and decreased appetite, had started Remeron 7.5 mg QD - needs to be compliant      Relevant Medications   mirtazapine (REMERON) 7.5 MG tablet   Other Relevant Orders   CMP14+EGFR   CBC with Differential/Platelet   Vitamin D deficiency   Relevant Orders   VITAMIN D 25 Hydroxy (Vit-D Deficiency, Fractures)   Marginal zone lymphoma ([place])    Follows up with Oncology      Prediabetes    Lab Results  Component Value Date   HGBA1C 4.9 11/13/2020  Continue to follow low carb diet      Relevant Orders   Hemoglobin A1c   Mixed hyperlipidemia   Relevant Medications   rosuvastatin (CRESTOR) 20 MG tablet   Other Relevant Orders   Lipid panel   Restless legs syndrome    Continue Ropinirole, refilled      Relevant Medications   rOPINIRole (REQUIP) 0.25 MG tablet   Other Relevant Orders   CBC with Differential/Platelet   Encounter for general adult medical examination with abnormal findings - Primary    Physical exam as documented. Fasting blood tests today. Advised to take Shingrix and Tdap vaccines at local pharmacy.      Relevant Orders   CMP14+EGFR   CBC with Differential/Platelet   Other Visit Diagnoses     Dysuria       Relevant Orders   UA/M w/rflx Culture, Routine       Meds ordered this encounter  Medications   mirtazapine (REMERON) 7.5 MG tablet    Sig: Take 1  tablet (7.5 mg  total) by mouth at bedtime.    Dispense:  30 tablet    Refill:  5   rOPINIRole (REQUIP) 0.25 MG tablet    Sig: Take 1 tablet (0.25 mg total) by mouth at bedtime.    Dispense:  90 tablet    Refill:  1   rosuvastatin (CRESTOR) 20 MG tablet    Sig: Take 1 tablet (20 mg total) by mouth daily.    Dispense:  90 tablet    Refill:  3    Follow-up: Return in about 2 months (around 07/05/2022) for HTN.    Lindell Spar, MD

## 2022-05-05 NOTE — Assessment & Plan Note (Signed)
BP Readings from Last 1 Encounters:  05/05/22 (!) 152/86   Has not taken Amlodipine today Overall well-controlled with Amlodipine 10 mg QD in the past Counseled for compliance with the medications Advised DASH diet

## 2022-05-05 NOTE — Assessment & Plan Note (Signed)
Lab Results  Component Value Date   HGBA1C 4.9 11/13/2020   Continue to follow low carb diet

## 2022-05-05 NOTE — Assessment & Plan Note (Signed)
Physical exam as documented. Fasting blood tests today. Advised to take Shingrix and Tdap vaccines at local pharmacy.

## 2022-05-05 NOTE — Assessment & Plan Note (Signed)
[place] Office Visit from 04/16/2022 in [facility]  PHQ-9 Total Score 0     Also reports insomnia and decreased appetite, had started Remeron 7.5 mg QD - needs to be compliant

## 2022-05-05 NOTE — Assessment & Plan Note (Signed)
Lab Results  Component Value Date   TSH 0.622 07/22/2021   On Levothyroxine 75 mcg QD, but needs to be compliant Will check TSH and free T4

## 2022-05-05 NOTE — Assessment & Plan Note (Signed)
Continue Ropinirole, refilled 

## 2022-05-05 NOTE — Assessment & Plan Note (Signed)
Follows up with Oncology 

## 2022-05-06 ENCOUNTER — Telehealth: Payer: Self-pay

## 2022-05-06 ENCOUNTER — Encounter: Payer: Self-pay | Admitting: Hematology and Oncology

## 2022-05-06 LAB — CBC WITH DIFFERENTIAL/PLATELET
Basophils Absolute: 0.1 10*3/uL (ref 0.0–0.2)
Basos: 1 %
EOS (ABSOLUTE): 0.5 10*3/uL — ABNORMAL HIGH (ref 0.0–0.4)
Eos: 6 %
Hematocrit: 39.7 % (ref 34.0–46.6)
Hemoglobin: 13.3 g/dL (ref 11.1–15.9)
Immature Grans (Abs): 0 10*3/uL (ref 0.0–0.1)
Immature Granulocytes: 0 %
Lymphocytes Absolute: 2.3 10*3/uL (ref 0.7–3.1)
Lymphs: 31 %
MCH: 30.4 pg (ref 26.6–33.0)
MCHC: 33.5 g/dL (ref 31.5–35.7)
MCV: 91 fL (ref 79–97)
Monocytes Absolute: 0.6 10*3/uL (ref 0.1–0.9)
Monocytes: 8 %
Neutrophils Absolute: 4 10*3/uL (ref 1.4–7.0)
Neutrophils: 54 %
Platelets: 204 10*3/uL (ref 150–450)
RBC: 4.38 x10E6/uL (ref 3.77–5.28)
RDW: 14.3 % (ref 11.7–15.4)
WBC: 7.4 10*3/uL (ref 3.4–10.8)

## 2022-05-06 LAB — TSH+FREE T4
Free T4: 0.99 ng/dL (ref 0.82–1.77)
TSH: 6.25 u[IU]/mL — ABNORMAL HIGH (ref 0.450–4.500)

## 2022-05-06 LAB — CMP14+EGFR
ALT: 19 IU/L (ref 0–32)
AST: 17 IU/L (ref 0–40)
Albumin/Globulin Ratio: 2.3 — ABNORMAL HIGH (ref 1.2–2.2)
Albumin: 4.6 g/dL (ref 3.8–4.8)
Alkaline Phosphatase: 83 IU/L (ref 44–121)
BUN/Creatinine Ratio: 11 — ABNORMAL LOW (ref 12–28)
BUN: 14 mg/dL (ref 8–27)
Bilirubin Total: 0.4 mg/dL (ref 0.0–1.2)
CO2: 22 mmol/L (ref 20–29)
Calcium: 9.4 mg/dL (ref 8.7–10.3)
Chloride: 103 mmol/L (ref 96–106)
Creatinine, Ser: 1.27 mg/dL — ABNORMAL HIGH (ref 0.57–1.00)
Globulin, Total: 2 g/dL (ref 1.5–4.5)
Glucose: 108 mg/dL — ABNORMAL HIGH (ref 70–99)
Potassium: 4.5 mmol/L (ref 3.5–5.2)
Sodium: 142 mmol/L (ref 134–144)
Total Protein: 6.6 g/dL (ref 6.0–8.5)
eGFR: 43 mL/min/{1.73_m2} — ABNORMAL LOW (ref >59)

## 2022-05-06 LAB — LIPID PANEL
Chol/HDL Ratio: 5.1 ratio — ABNORMAL HIGH (ref 0.0–4.4)
Cholesterol, Total: 303 mg/dL — ABNORMAL HIGH (ref 100–199)
HDL: 60 mg/dL (ref >39)
LDL Chol Calc (NIH): 182 mg/dL — ABNORMAL HIGH (ref 0–99)
Triglycerides: 315 mg/dL — ABNORMAL HIGH (ref 0–149)
VLDL Cholesterol Cal: 61 mg/dL — ABNORMAL HIGH (ref 5–40)

## 2022-05-06 LAB — HEMOGLOBIN A1C
Est. average glucose Bld gHb Est-mCnc: 123 mg/dL
Hgb A1c MFr Bld: 5.9 % — ABNORMAL HIGH (ref 4.8–5.6)

## 2022-05-06 LAB — VITAMIN D 25 HYDROXY (VIT D DEFICIENCY, FRACTURES): Vit D, 25-Hydroxy: 22.4 ng/mL — ABNORMAL LOW (ref 30.0–100.0)

## 2022-05-06 NOTE — Telephone Encounter (Signed)
[name] moss returning call on patient lab results.

## 2022-05-07 NOTE — Telephone Encounter (Signed)
Aware of lab results  

## 2022-05-07 NOTE — Progress Notes (Signed)
Pt verbalized understanding.

## 2022-05-09 ENCOUNTER — Telehealth: Payer: Self-pay

## 2022-05-09 LAB — URINE CULTURE, REFLEX

## 2022-05-09 LAB — UA/M W/RFLX CULTURE, ROUTINE
Bilirubin, UA: NEGATIVE
Glucose, UA: NEGATIVE
Ketones, UA: NEGATIVE
Nitrite, UA: NEGATIVE
RBC, UA: NEGATIVE
Specific Gravity, UA: 1.016 (ref 1.005–1.030)
Urobilinogen, Ur: 0.2 mg/dL (ref 0.2–1.0)
pH, UA: 7 (ref 5.0–7.5)

## 2022-05-09 LAB — MICROSCOPIC EXAMINATION
Bacteria, UA: NONE SEEN
Casts: NONE SEEN /lpf
RBC, Urine: NONE SEEN /hpf (ref 0–2)

## 2022-05-09 MED ORDER — SULFAMETHOXAZOLE-TRIMETHOPRIM 800-160 MG PO TABS
1.0000 | ORAL_TABLET | Freq: Two times a day (BID) | ORAL | 0 refills | Status: DC
Start: 2022-05-09 — End: 2022-07-09

## 2022-05-09 NOTE — Telephone Encounter (Signed)
Patient returning lab results call. [phone] Elizabeth Mcintyre

## 2022-05-09 NOTE — Addendum Note (Signed)
Addended byIhor Dow on: 05/09/2022 07:54 AM   Modules accepted: Orders

## 2022-05-09 NOTE — Telephone Encounter (Signed)
Patient daughter Elizabeth Mcintyre advised with verbal understanding

## 2022-05-13 ENCOUNTER — Ambulatory Visit: Payer: Medicare HMO | Admitting: Orthopaedic Surgery

## 2022-05-13 ENCOUNTER — Encounter: Payer: Self-pay | Admitting: Orthopaedic Surgery

## 2022-05-13 VITALS — BP 156/80 | HR 80 | Ht 61.0 in | Wt 130.0 lb

## 2022-05-13 DIAGNOSIS — G8929 Other chronic pain: Secondary | ICD-10-CM | POA: Diagnosis not present

## 2022-05-13 DIAGNOSIS — M25511 Pain in right shoulder: Secondary | ICD-10-CM | POA: Diagnosis not present

## 2022-05-13 NOTE — Progress Notes (Signed)
PROCEDURE NOTE:  The patient request injection, verbal consent was obtained.  The right shoulder was prepped appropriately after time out was performed.   Sterile technique was observed and injection of 1 cc of DepoMedrol '40mg'$  with several cc's of plain xylocaine. Anesthesia was provided by ethyl chloride and a 20-gauge needle was used to inject the shoulder area. A posterior approach was used.  The injection was tolerated well.  A band aid dressing was applied.  The patient was advised to apply ice later today and tomorrow to the injection sight as needed.  Encounter Diagnosis  Name Primary?   Chronic right shoulder pain Yes   Return in one month.  Call if any problem.  Precautions discussed.  Electronically Signed Sanjuana Kava, MD 8/29/20233:02 PM

## 2022-05-23 ENCOUNTER — Other Ambulatory Visit: Payer: Self-pay | Admitting: Internal Medicine

## 2022-05-23 DIAGNOSIS — Z1231 Encounter for screening mammogram for malignant neoplasm of breast: Secondary | ICD-10-CM

## 2022-06-20 ENCOUNTER — Ambulatory Visit: Payer: Medicare HMO

## 2022-06-27 ENCOUNTER — Ambulatory Visit
Admission: RE | Admit: 2022-06-27 | Discharge: 2022-06-27 | Disposition: A | Discharge status: Home / Self Care | Payer: Medicare HMO | Source: Ambulatory Visit | Attending: Internal Medicine | Admitting: Internal Medicine

## 2022-06-27 DIAGNOSIS — Z1231 Encounter for screening mammogram for malignant neoplasm of breast: Secondary | ICD-10-CM | POA: Diagnosis not present

## 2022-07-09 ENCOUNTER — Encounter: Payer: Self-pay | Admitting: Internal Medicine

## 2022-07-09 ENCOUNTER — Ambulatory Visit (INDEPENDENT_AMBULATORY_CARE_PROVIDER_SITE_OTHER): Payer: Medicare HMO | Admitting: Internal Medicine

## 2022-07-09 VITALS — BP 118/64 | HR 92 | Resp 16 | Ht 62.0 in | Wt 132.0 lb

## 2022-07-09 DIAGNOSIS — R197 Diarrhea, unspecified: Secondary | ICD-10-CM | POA: Diagnosis not present

## 2022-07-09 DIAGNOSIS — F0280 Dementia in other diseases classified elsewhere without behavioral disturbance: Secondary | ICD-10-CM

## 2022-07-09 DIAGNOSIS — G3109 Other frontotemporal dementia: Secondary | ICD-10-CM | POA: Diagnosis not present

## 2022-07-09 DIAGNOSIS — Z23 Encounter for immunization: Secondary | ICD-10-CM

## 2022-07-09 DIAGNOSIS — N1831 Chronic kidney disease, stage 3a: Secondary | ICD-10-CM | POA: Diagnosis not present

## 2022-07-09 DIAGNOSIS — E039 Hypothyroidism, unspecified: Secondary | ICD-10-CM | POA: Diagnosis not present

## 2022-07-09 DIAGNOSIS — I10 Essential (primary) hypertension: Secondary | ICD-10-CM | POA: Diagnosis not present

## 2022-07-09 NOTE — Progress Notes (Signed)
Established Patient Office Visit  Subjective:  Patient ID: Elizabeth Mcintyre, female    DOB: 1941-01-06  Age: 81 y.o. MRN: [id number]  CC:  Chief Complaint  Patient presents with   Follow-up    2 month follow up HTN pt has been having diarrhea     HPI Elizabeth Mcintyre is a 81 y.o. 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  She reports having watery to loose BM for the last 3 weeks.  Of note, she had Bactrim for UTI about 2 months ago.  She denies any fever or chills currently.  Denies any nausea or vomiting.  She reports mild, generalized abdominal pain.  She has tried taking Imodium with some relief.  HTN: BP is well-controlled. Takes medications regularly. Patient denies headache, dizziness, chest pain, dyspnea or palpitations.  Hypothyroidism: Her TSH was elevated in the last visit.  Of note, she had stopped taking levothyroxine.  Her daughter is helping her to take medicines regularly now.  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   Lower GI bleeding 04/30/2020   Lymphadenopathy 01/23/2020   Osteopenia 2017   Last   bone density 05/04/2017: -2.4   PONV (postoperative nausea and vomiting)    Schatzki's ring    Stress incontinence    Stroke [facility])    mini stroke - found on a CT scan    Past Surgical History:  Procedure Laterality Date   ABDOMINAL HYSTERECTOMY     BALLOON DILATION N/A 08/21/2020   Procedure: BALLOON DILATION;  Surgeon: Eloise Harman, DO;  Location: [facility];  Service: Endoscopy;  Laterality: N/A;   BIOPSY  03/30/2020   Procedure: BIOPSY;  Surgeon: Ronald Lobo, MD;  Location: [facility];  Service: Endoscopy;;   BIOPSY  05/02/2020   Procedure: BIOPSY;  Surgeon: Montez Morita, Quillian Quince, MD;  Location: [facility];  Service: Gastroenterology;;   CARDIAC CATHETERIZATION     X 2, last one in [place] 04/13/2017   Procedure: LAPAROSCOPIC CHOLECYSTECTOMY;  Surgeon: Aviva Signs, MD;  Location: [facility] ORS;  Service: General;  Laterality: N/A;   COLONOSCOPY     COLONOSCOPY  May 2012   Dr. Olevia Perches: mild diverticulosis, otherwise normal.    COLONOSCOPY WITH PROPOFOL N/A 05/02/2020   Dr. Jenetta Downer: 8 mm polyp removed from the ascending colon, 2 mm polyp removed from the ascending colon.  Tubular adenomas.  Diverticulosis.  Mucosal ulceration in the sigmoid colon noted, pathology consistent with ischemic colitis.   ESOPHAGOGASTRODUODENOSCOPY N/A 01/28/2015  Dr. Jena Gauss: reflux esophagitis, Schatzki's ring not manipulated due to recent bleeding   ESOPHAGOGASTRODUODENOSCOPY N/A 03/30/2015   Dr. Jena Gauss: Schatzki's ring s/p Elease Hashimoto dilation, previously noted esophageal ulcer completely healed   ESOPHAGOGASTRODUODENOSCOPY N/A 03/30/2020   Buccini: Moderately severe erosive, circumferential, confluent esophagitis with no bleeding found 25 to 40 cm from incisors.  Nonobstructing and mild Schatzki ring, there were also multiple distal esophageal rings noted, minimal hiatal hernia.   ESOPHAGOGASTRODUODENOSCOPY (EGD) WITH PROPOFOL N/A 08/21/2020   Procedure: ESOPHAGOGASTRODUODENOSCOPY  (EGD) WITH PROPOFOL;  Surgeon: Lanelle Bal, DO;  [facility];  Service: Endoscopy;  Laterality: N/A;  2:15pm   IR IMAGING GUIDED PORT INSERTION  03/13/2020   IR REMOVAL TUN ACCESS W/ PORT W/O FL MOD SED  10/01/2021   LYMPH NODE BIOPSY Left 03/20/2020   Procedure: LEFT POSTERIOR CERVICAL LYMPH NODE BIOPSY;  Surgeon: Violeta Gelinas, MD;  Location: [facility] OR;  Service: General;  Laterality: Left;   MALONEY DILATION N/A 03/30/2015   Procedure: Alvy Beal;  Surgeon: Corbin Ade, MD;  [facility];  Service: Endoscopy;  Laterality: N/A;   POLYPECTOMY  05/02/2020   Procedure: POLYPECTOMY;  Surgeon: Marguerita Merles, Reuel Boom, MD;  Location[facility];  Service: Gastroenterology;;    Family History  Problem Relation Age of Onset   Heart disease Mother    Osteoarthritis Mother    Sudden death Father    Single kidney Father    Other Father        h/o severe MVA injuries   Hyperlipidemia Sister    Other Daughter        Myalgias   Fibromyalgia Daughter    Allergies Daughter    Heart disease Maternal Grandfather    Sudden death Paternal Grandmother    Diabetes Paternal Grandfather    Heart disease Daughter    Other Daughter        palpitations   Pulmonary fibrosis Maternal Aunt    Cancer Paternal Uncle    Pulmonary fibrosis Maternal Aunt    Colon cancer Neg Hx     Social History   Socioeconomic History   Marital status: Widowed    Spouse name: Not on file   Number of children: 2   Years of education: Not on file   Highest education level: Not on file  Occupational History   Occupation: retired  Tobacco Use   Smoking status: Never   Smokeless tobacco: Never  Vaping Use   Vaping Use: Never used  Substance and Sexual Activity   Alcohol use: No   Drug use: No   Sexual activity: Never  Other Topics Concern   Not on file  Social History Narrative   Ms. Wolden is widowed. Her young grandson lives with her, for whom she shares custody with her  daughter, the son's aunt.     Right handed    Social Determinants of Health   Financial Resource Strain: Low Risk  (06/04/2021)   Overall Financial Resource Strain (CARDIA)    Difficulty of Paying Living Expenses: Not hard at all  Food Insecurity: No Food Insecurity (06/04/2021)   Hunger Vital Sign    Worried About Running Out of Food in the Last Year: Never true    Ran Out of Food in the Last Year: Never true  Transportation Needs: No Transportation Needs (06/04/2021)   PRAPARE - Administrator, Civil Service (Medical): No    Lack of Transportation (Non-Medical): No  Physical Activity: Insufficiently Active (06/04/2021)  Exercise Vital Sign    Days of Exercise per Week: 5 days    Minutes of Exercise per Session: 10 min  Stress: No Stress Concern Present (06/04/2021)   [employer] of Occupational Health - Occupational Stress Questionnaire    Feeling of Stress : Only a little  Social Connections: Socially Isolated (06/04/2021)   Social Connection and Isolation Panel [NHANES]    Frequency of Communication with Friends and Family: More than three times a week    Frequency of Social Gatherings with Friends and Family: More than three times a week    Attends Religious Services: Never    Database administrator or Organizations: No    Attends Banker Meetings: Never    Marital Status: Widowed  Intimate Partner Violence: Not At Risk (06/04/2021)   Humiliation, Afraid, Rape, and Kick questionnaire    Fear of Current or Ex-Partner: No    Emotionally Abused: No    Physically Abused: No    Sexually Abused: No    Outpatient Medications Prior to Visit  Medication Sig Dispense Refill   acetaminophen (TYLENOL) 325 MG tablet Take 325 mg by mouth every 6 (six) hours as needed for moderate pain.      amLODipine (NORVASC) 10 MG tablet Take 1 tablet (10 mg total) by mouth daily. 90 tablet 3   Ascorbic Acid (VITAMIN C) 100 MG tablet Take 100 mg by mouth daily.      cetirizine (ZYRTEC) 10 MG tablet Take 10 mg by mouth at bedtime. As needed     Cholecalciferol (VITAMIN D3) 25 MCG (1000 UT) CAPS Take 1,000 Units by mouth daily.      donepezil (ARICEPT) 10 MG tablet TAKE 1 TABLET BY MOUTH DAILY 30 tablet 3   levothyroxine (SYNTHROID) 75 MCG tablet TAKE 1 TABLET(75 MCG) BY MOUTH DAILY 30 tablet 3   lidocaine-prilocaine (EMLA) cream Apply 1 application topically as needed. (Patient taking differently: Apply 1 application  topically as needed (port access).) 30 g 1   mirtazapine (REMERON) 7.5 MG tablet Take 1 tablet (7.5 mg total) by mouth at bedtime. 30 tablet 5   pantoprazole (PROTONIX) 40 MG tablet TAKE 1 TABLET(40 MG) BY MOUTH DAILY 90 tablet 3   rOPINIRole (REQUIP) 0.25 MG tablet Take 1 tablet (0.25 mg total) by mouth at bedtime. 90 tablet 1   rosuvastatin (CRESTOR) 20 MG tablet Take 1 tablet (20 mg total) by mouth daily. 90 tablet 3   sucralfate (CARAFATE) 1 g tablet Take 1 g by mouth 4 (four) times daily -  with meals and at bedtime. As needed     tiZANidine (ZANAFLEX) 4 MG tablet Take 1 tablet (4 mg total) by mouth at bedtime as needed for muscle spasms. Do not take with alcohol or while driving or operating heavy machinery 30 tablet 0   vitamin B-12 (CYANOCOBALAMIN) 1000 MCG tablet Take 1,000 mcg by mouth daily.      potassium chloride SA (KLOR-CON) 20 MEQ tablet Take 1 tablet (20 mEq total) by mouth daily. 21 tablet 0   sulfamethoxazole-trimethoprim (BACTRIM DS) 800-160 MG tablet Take 1 tablet by mouth 2 (two) times daily. 10 tablet 0   albuterol (VENTOLIN HFA) 108 (90 Base) MCG/ACT inhaler Inhale 2 puffs into the lungs every 6 (six) hours as needed for wheezing. 8.5 g 2   No facility-administered medications prior to visit.    Allergies  Allergen Reactions   Codeine Phosphate Nausea And Vomiting and Rash    ROS Review of Systems  Constitutional:  Positive for fatigue. Negative for chills and fever.  HENT:  Negative for congestion, sinus pressure,  sinus pain and sore throat.   Eyes:  Positive for visual disturbance. Negative for pain and discharge.  Respiratory:  Negative for cough and shortness of breath.   Cardiovascular:  Negative for chest pain and palpitations.  Gastrointestinal:  Positive for diarrhea. Negative for abdominal pain, nausea and vomiting.  Endocrine: Negative for polydipsia and polyuria.  Genitourinary:  Negative for dysuria and hematuria.  Musculoskeletal:  Positive for arthralgias (Right shoulder). Negative for neck pain and neck stiffness.  Skin:  Negative for rash.  Neurological:  Positive for dizziness. Negative for weakness.  Psychiatric/Behavioral:  Positive for dysphoric mood and sleep disturbance. Negative for agitation and behavioral problems.       Objective:    Physical Exam Vitals reviewed.  Constitutional:      General: She is not in acute distress.    Appearance: She is not diaphoretic.  HENT:     Head: Normocephalic and atraumatic.     Nose: Nose normal.     Mouth/Throat:     Mouth: Mucous membranes are moist.  Eyes:     General: No scleral icterus.    Extraocular Movements: Extraocular movements intact.  Cardiovascular:     Rate and Rhythm: Normal rate and regular rhythm.     Pulses: Normal pulses.     Heart sounds: Normal heart sounds. No murmur heard. Pulmonary:     Breath sounds: Normal breath sounds. No wheezing or rales.  Abdominal:     General: Bowel sounds are normal.     Palpations: Abdomen is soft.     Tenderness: There is abdominal tenderness (Mild, epigastric and RUQ).  Musculoskeletal:     Cervical back: Neck supple. No tenderness.     Right lower leg: No edema.     Left lower leg: No edema.     Comments: ROM at right shoulder limited due to pain  Skin:    General: Skin is warm.     Findings: No rash.  Neurological:     General: No focal deficit present.     Mental Status: She is alert and oriented to person, place, and time.     Sensory: No sensory deficit.      Motor: Weakness (B/l LE) present.  Psychiatric:        Mood and Affect: Mood normal.        Behavior: Behavior normal.     BP 118/64 (BP Location: Left Arm, Patient Position: Sitting, Cuff Size: Normal)   Pulse 92   Resp 16   Ht $R'5\' 2"'pM$  (1.575 m)   Wt 132 lb (59.9 kg)   SpO2 94%   BMI 24.14 kg/m  Wt Readings from Last 3 Encounters:  07/09/22 132 lb (59.9 kg)  05/13/22 130 lb (59 kg)  05/05/22 131 lb 3.2 oz (59.5 kg)    Lab Results  Component Value Date   TSH 6.250 (H) 05/05/2022   Lab Results  Component Value Date   WBC 7.4 05/05/2022   HGB 13.3 05/05/2022   HCT 39.7 05/05/2022   MCV 91 05/05/2022   PLT 204 05/05/2022   Lab Results  Component Value Date   NA 142 05/05/2022   K 4.5 05/05/2022   CO2 22 05/05/2022   GLUCOSE 108 (H) 05/05/2022   BUN 14 05/05/2022   CREATININE 1.27 (H) 05/05/2022   BILITOT 0.4 05/05/2022   ALKPHOS 83 05/05/2022   AST 17 05/05/2022  ALT 19 05/05/2022   PROT 6.6 05/05/2022   ALBUMIN 4.6 05/05/2022   CALCIUM 9.4 05/05/2022   ANIONGAP 8 01/31/2022   EGFR 43 (L) 05/05/2022   GFR 33.87 (L) 12/29/2019   Lab Results  Component Value Date   CHOL 303 (H) 05/05/2022   Lab Results  Component Value Date   HDL 60 05/05/2022   Lab Results  Component Value Date   LDLCALC 182 (H) 05/05/2022   Lab Results  Component Value Date   TRIG 315 (H) 05/05/2022   Lab Results  Component Value Date   CHOLHDL 5.1 (H) 05/05/2022   Lab Results  Component Value Date   HGBA1C 5.9 (H) 05/05/2022      Assessment & Plan:   Problem List Items Addressed This Visit       Cardiovascular and Mediastinum   Essential hypertension, benign - Primary    BP Readings from Last 1 Encounters:  07/09/22 118/64  Overall well-controlled with Amlodipine 10 mg QD Counseled for compliance with the medications Advised DASH diet      Relevant Orders   Basic Metabolic Panel (BMET)     Endocrine   Acquired hypothyroidism    Lab Results  Component Value  Date   TSH 6.250 (H) 05/05/2022  On Levothyroxine 75 mcg QD, but needs to be compliant Will check TSH and free T4      Relevant Orders   TSH + free T4     Nervous and Auditory   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 ratio     Other   Diarrhea    Reports watery diarrhea for last few days Unclear etiology at the time of office visit - check GI profile      Relevant Orders   GI Profile, Stool, PCR   Other Visit Diagnoses     Need for immunization against influenza       Relevant Orders   Flu Vaccine QUAD High Dose(Fluad) (Completed)       No orders of the defined types were placed in this encounter.   Follow-up: Return in about 3 months (around 10/09/2022) for HTN and hypothyroidism.    Lindell Spar, MD

## 2022-07-09 NOTE — Assessment & Plan Note (Signed)
Reports watery diarrhea for last few days Unclear etiology at the time of office visit - check GI profile

## 2022-07-09 NOTE — Assessment & Plan Note (Signed)
Followed by Neurology On Aricept now

## 2022-07-09 NOTE — Patient Instructions (Signed)
Please continue taking medications as prescribed.  Please continue to follow low salt diet and perform moderate exercise/walking at least 150 mins/week.  Please maintain at least 64 ounces of fluid in a day.

## 2022-07-09 NOTE — Assessment & Plan Note (Signed)
BP Readings from Last 1 Encounters:  07/09/22 118/64   Overall well-controlled with Amlodipine 10 mg QD Counseled for compliance with the medications Advised DASH diet

## 2022-07-09 NOTE — Assessment & Plan Note (Signed)
Check CMP - needs to improve hydration Avoid nephrotoxic agents 

## 2022-07-09 NOTE — Assessment & Plan Note (Signed)
Lab Results  Component Value Date   TSH 6.250 (H) 05/05/2022   On Levothyroxine 75 mcg QD, but needs to be compliant Will check TSH and free T4

## 2022-07-10 LAB — BASIC METABOLIC PANEL
BUN/Creatinine Ratio: 13 (ref 12–28)
BUN: 18 mg/dL (ref 8–27)
CO2: 23 mmol/L (ref 20–29)
Calcium: 10.4 mg/dL — ABNORMAL HIGH (ref 8.7–10.3)
Chloride: 102 mmol/L (ref 96–106)
Creatinine, Ser: 1.39 mg/dL — ABNORMAL HIGH (ref 0.57–1.00)
Glucose: 98 mg/dL (ref 70–99)
Potassium: 4.6 mmol/L (ref 3.5–5.2)
Sodium: 143 mmol/L (ref 134–144)
eGFR: 38 mL/min/{1.73_m2} — ABNORMAL LOW (ref >59)

## 2022-07-10 LAB — TSH+FREE T4
Free T4: 1.44 ng/dL (ref 0.82–1.77)
TSH: 0.736 u[IU]/mL (ref 0.450–4.500)

## 2022-07-11 ENCOUNTER — Other Ambulatory Visit: Payer: Self-pay

## 2022-07-11 ENCOUNTER — Encounter (HOSPITAL_COMMUNITY): Payer: Self-pay | Admitting: Emergency Medicine

## 2022-07-11 ENCOUNTER — Emergency Department (HOSPITAL_COMMUNITY)
Admission: EM | Admit: 2022-07-11 | Discharge: 2022-07-11 | Disposition: A | Discharge status: Home / Self Care | Payer: Medicare HMO | Attending: Emergency Medicine | Admitting: Emergency Medicine

## 2022-07-11 DIAGNOSIS — I10 Essential (primary) hypertension: Secondary | ICD-10-CM | POA: Insufficient documentation

## 2022-07-11 DIAGNOSIS — L03012 Cellulitis of left finger: Secondary | ICD-10-CM

## 2022-07-11 DIAGNOSIS — W5501XA Bitten by cat, initial encounter: Secondary | ICD-10-CM | POA: Insufficient documentation

## 2022-07-11 DIAGNOSIS — J45909 Unspecified asthma, uncomplicated: Secondary | ICD-10-CM | POA: Diagnosis not present

## 2022-07-11 DIAGNOSIS — E119 Type 2 diabetes mellitus without complications: Secondary | ICD-10-CM | POA: Insufficient documentation

## 2022-07-11 DIAGNOSIS — M069 Rheumatoid arthritis, unspecified: Secondary | ICD-10-CM | POA: Diagnosis not present

## 2022-07-11 DIAGNOSIS — Z79899 Other long term (current) drug therapy: Secondary | ICD-10-CM | POA: Insufficient documentation

## 2022-07-11 DIAGNOSIS — L03114 Cellulitis of left upper limb: Secondary | ICD-10-CM | POA: Diagnosis not present

## 2022-07-11 DIAGNOSIS — Z23 Encounter for immunization: Secondary | ICD-10-CM | POA: Insufficient documentation

## 2022-07-11 DIAGNOSIS — S61452A Open bite of left hand, initial encounter: Secondary | ICD-10-CM

## 2022-07-11 MED ORDER — TETANUS-DIPHTH-ACELL PERTUSSIS 5-2.5-18.5 LF-MCG/0.5 IM SUSY
0.5000 mL | PREFILLED_SYRINGE | Freq: Once | INTRAMUSCULAR | Status: AC
Start: 2022-07-11 — End: 2022-07-11
  Administered 2022-07-11: 0.5 mL via INTRAMUSCULAR
  Filled 2022-07-11: qty 0.5

## 2022-07-11 MED ORDER — AMOXICILLIN-POT CLAVULANATE 875-125 MG PO TABS
1.0000 | ORAL_TABLET | Freq: Once | ORAL | Status: AC
  Administered 2022-07-11: 1 via ORAL
  Filled 2022-07-11: qty 1

## 2022-07-11 MED ORDER — AMOXICILLIN-POT CLAVULANATE 875-125 MG PO TABS
1.0000 | ORAL_TABLET | Freq: Two times a day (BID) | ORAL | 0 refills | Status: DC
Start: 2022-07-11 — End: 2022-08-01

## 2022-07-11 NOTE — Discharge Instructions (Addendum)
You were seen in the emergency department today for an animal bite.  We have given you the first dose of antibiotics, as well as updated your tetanus vaccination.  I have sent the rest of the antibiotic to your pharmacy.  If you notice that the redness is worsening after you finish the antibiotics, I recommend follow up in the ER.   As we discussed, I do not feel it is necessary to start the rabies vaccination series at this time.  That being said we will contact animal control, and they should follow-up with you.  If you at any point have any concern that your animals starting to exhibit any symptoms, I recommend returning to the ER to begin the vaccination series.  Continue to monitor how you're doing and return to the ER for new or worsening symptoms.

## 2022-07-11 NOTE — ED Notes (Addendum)
[place] notified  via fax

## 2022-07-11 NOTE — ED Provider Notes (Signed)
[facility] EMERGENCY DEPARTMENT Provider Note   CSN: [id number] Arrival date & time: 07/11/22  1419     History  Chief Complaint  Patient presents with   Animal Bite    Elizabeth Mcintyre is a 81 y.o. female with history of HLD, HTN, asthma, depression, type 2 diabetes, rheumatoid arthritis who presents to the ER for a cat bite.  Patient states that 2 days ago she was helping her cat deliver kittens, when the cat bit her left hand.  She states that the cat has been acting normally since.  Reports cat is not up-to-date on vaccinations.  She presents today because the bite on her hand appeared more swollen and red.  No other complaints at this time.  Unknown last tetanus.   Animal Bite Contact animal:  Cat Location:  Hand      Home Medications Prior to Admission medications   Medication Sig Start Date End Date Taking? Authorizing Provider  amoxicillin-clavulanate (AUGMENTIN) 875-125 MG tablet Take 1 tablet by mouth every 12 (twelve) hours. 07/11/22  Yes Hilarie Sinha T, PA-C  acetaminophen (TYLENOL) 325 MG tablet Take 325 mg by mouth every 6 (six) hours as needed for moderate pain.     [provider]  albuterol (VENTOLIN HFA) 108 (90 Base) MCG/ACT inhaler Inhale 2 puffs into the lungs every 6 (six) hours as needed for wheezing. 08/14/20 08/14/21  Kuneff, Renee A, DO  amLODipine (NORVASC) 10 MG tablet Take 1 tablet (10 mg total) by mouth daily. 04/16/22   Lindell Spar, MD  Ascorbic Acid (VITAMIN C) 100 MG tablet Take 100 mg by mouth daily.    [provider]  cetirizine (ZYRTEC) 10 MG tablet Take 10 mg by mouth at bedtime. As needed    [provider]  Cholecalciferol (VITAMIN D3) 25 MCG (1000 UT) CAPS Take 1,000 Units by mouth daily.     [provider]  donepezil (ARICEPT) 10 MG tablet TAKE 1 TABLET BY MOUTH DAILY 04/11/22   Rondel Jumbo, PA-C  levothyroxine (SYNTHROID) 75 MCG tablet TAKE 1 TABLET(75 MCG) BY MOUTH DAILY 04/14/22   Lindell Spar, MD  lidocaine-prilocaine (EMLA) cream Apply 1 application topically as needed. Patient taking differently: Apply 1 application  topically as needed (port access). 04/12/20   Orson Slick, MD  mirtazapine (REMERON) 7.5 MG tablet Take 1 tablet (7.5 mg total) by mouth at bedtime. 05/05/22   Lindell Spar, MD  pantoprazole (PROTONIX) 40 MG tablet TAKE 1 TABLET(40 MG) BY MOUTH DAILY 04/28/22   Eloise Harman, DO  rOPINIRole (REQUIP) 0.25 MG tablet Take 1 tablet (0.25 mg total) by mouth at bedtime. 05/05/22   Lindell Spar, MD  rosuvastatin (CRESTOR) 20 MG tablet Take 1 tablet (20 mg total) by mouth daily. 05/05/22   Lindell Spar, MD  sucralfate (CARAFATE) 1 g tablet Take 1 g by mouth 4 (four) times daily -  with meals and at bedtime. As needed    [provider]  tiZANidine (ZANAFLEX) 4 MG tablet Take 1 tablet (4 mg total) by mouth at bedtime as needed for muscle spasms. Do not take with alcohol or while driving or operating heavy machinery 04/11/22   Eulogio Bear, NP  vitamin B-12 (CYANOCOBALAMIN) 1000 MCG tablet Take 1,000 mcg by mouth daily.     [provider]      Allergies    Codeine phosphate    Review of Systems   Review of Systems  Skin:  Positive for color change and wound.  All other systems reviewed and are negative.   Physical Exam Updated Vital Signs BP (!) 141/58 (BP Location: Right Arm)   Pulse 94   Temp (!) 97.5 F (36.4 C) (Oral)   Resp 18   SpO2 100%  Physical Exam Vitals and nursing note reviewed.  Constitutional:      Appearance: Normal appearance.  HENT:     Head: Normocephalic and atraumatic.  Eyes:     Conjunctiva/sclera: Conjunctivae normal.  Pulmonary:     Effort: Pulmonary effort is normal. No respiratory distress.  Musculoskeletal:     Comments: Normal range of motion of all digits of the left hand.  Skin:    General: Skin is warm and dry.     Comments: 2 partially healed puncture wounds noted over the  dorsum of the left hand with associated erythema and edema.  No fluctuance.  Neurological:     Mental Status: She is alert.  Psychiatric:        Mood and Affect: Mood normal.        Behavior: Behavior normal.     ED Results / Procedures / Treatments   Labs (all labs ordered are listed, but only abnormal results are displayed) Labs Reviewed - No data to display  EKG None  Radiology No results found.  Procedures Procedures    Medications Ordered in ED Medications  amoxicillin-clavulanate (AUGMENTIN) 875-125 MG per tablet 1 tablet (1 tablet Oral Given 07/11/22 1708)  Tdap (BOOSTRIX) injection 0.5 mL (0.5 mLs Intramuscular Given 07/11/22 1709)    ED Course/ Medical Decision Making/ A&P                           Medical Decision Making This patient is a 81 y.o. female who presents to the ED for concern of cat bite. Cat is owned by the patient, not up to date on vaccinations.  Past Medical History / Social History / Additional history: Chart reviewed. Pertinent results include: HLD, HTN, asthma, depression, type 2 diabetes, rheumatoid arthritis  Physical Exam: Physical exam performed. The pertinent findings include: Two puncture wounds to left hand with erythema and edema  Medications / Treatment: Updated Tdap, given initial dose of augmentin.  Discussed potentially starting the rabies vaccination series with the patient.  Since the cat is well-known to her, and is able to be isolated and evaluated, I have very low concern for rabies transmission today.  That being said we will contact animal control per protocol, and recommend that patient follow-up if the cat displays any symptoms.   Disposition: After consideration of the diagnostic results and the patients response to treatment, I feel that emergency department workup does not suggest an emergent condition requiring admission or immediate intervention beyond what has been performed at this time. The plan is: Discharge to  home with prescription for Augmentin and follow-up with animal control.  Patient understands she is to follow-up in the ER if cellulitis worsens after antibiotics.  The patient is safe for discharge and has been instructed to return immediately for worsening symptoms, change in symptoms or any other concerns.  Final Clinical Impression(s) / ED Diagnoses Final diagnoses:  Cat bite of left hand, initial encounter  Cellulitis of finger of left hand    Rx / DC Orders ED Discharge Orders          Ordered    amoxicillin-clavulanate (AUGMENTIN) 875-125 MG tablet  Every 12 hours  07/11/22 1659           Portions of this report may have been transcribed using voice recognition software. Every effort was made to ensure accuracy; however, inadvertent computerized transcription errors may be present.    Estill Cotta 07/11/22 Ann Lions, MD 07/11/22 1810

## 2022-07-11 NOTE — ED Triage Notes (Signed)
Pt presents with cat bite to left hand.  States she was attempting to help the cat deliver kittens and the cat bit her.  Cat is not up to date on vaccinations.  Swelling and redness to left hand.

## 2022-07-16 DIAGNOSIS — H524 Presbyopia: Secondary | ICD-10-CM | POA: Diagnosis not present

## 2022-07-16 DIAGNOSIS — H5203 Hypermetropia, bilateral: Secondary | ICD-10-CM | POA: Diagnosis not present

## 2022-07-16 DIAGNOSIS — H52223 Regular astigmatism, bilateral: Secondary | ICD-10-CM | POA: Diagnosis not present

## 2022-07-16 LAB — HM DIABETES EYE EXAM

## 2022-08-01 ENCOUNTER — Inpatient Hospital Stay: Payer: Medicare HMO | Attending: Hematology and Oncology

## 2022-08-01 ENCOUNTER — Other Ambulatory Visit: Payer: Self-pay | Admitting: Hematology and Oncology

## 2022-08-01 ENCOUNTER — Inpatient Hospital Stay: Payer: Medicare HMO | Admitting: Hematology and Oncology

## 2022-08-01 ENCOUNTER — Other Ambulatory Visit: Payer: Self-pay

## 2022-08-01 VITALS — BP 147/89 | HR 92 | Temp 97.5°F | Resp 16 | Wt 133.9 lb

## 2022-08-01 DIAGNOSIS — Z95828 Presence of other vascular implants and grafts: Secondary | ICD-10-CM

## 2022-08-01 DIAGNOSIS — Z79899 Other long term (current) drug therapy: Secondary | ICD-10-CM | POA: Diagnosis not present

## 2022-08-01 DIAGNOSIS — C8314 Mantle cell lymphoma, lymph nodes of axilla and upper limb: Secondary | ICD-10-CM | POA: Insufficient documentation

## 2022-08-01 DIAGNOSIS — C8580 Other specified types of non-Hodgkin lymphoma, unspecified site: Secondary | ICD-10-CM

## 2022-08-01 LAB — CMP (CANCER CENTER ONLY)
ALT: 16 U/L (ref 0–44)
AST: 18 U/L (ref 15–41)
Albumin: 4.5 g/dL (ref 3.5–5.0)
Alkaline Phosphatase: 75 U/L (ref 38–126)
Anion gap: 7 (ref 5–15)
BUN: 24 mg/dL — ABNORMAL HIGH (ref 8–23)
CO2: 25 mmol/L (ref 22–32)
Calcium: 9.8 mg/dL (ref 8.9–10.3)
Chloride: 108 mmol/L (ref 98–111)
Creatinine: 1.27 mg/dL — ABNORMAL HIGH (ref 0.44–1.00)
GFR, Estimated: 43 mL/min — ABNORMAL LOW (ref >60)
Glucose, Bld: 119 mg/dL — ABNORMAL HIGH (ref 70–99)
Potassium: 4 mmol/L (ref 3.5–5.1)
Sodium: 140 mmol/L (ref 135–145)
Total Bilirubin: 0.3 mg/dL (ref 0.3–1.2)
Total Protein: 7.1 g/dL (ref 6.5–8.1)

## 2022-08-01 LAB — CBC WITH DIFFERENTIAL (CANCER CENTER ONLY)
Abs Immature Granulocytes: 0.08 10*3/uL — ABNORMAL HIGH (ref 0.00–0.07)
Basophils Absolute: 0.1 10*3/uL (ref 0.0–0.1)
Basophils Relative: 1 %
Eosinophils Absolute: 0.5 10*3/uL (ref 0.0–0.5)
Eosinophils Relative: 6 %
HCT: 35.8 % — ABNORMAL LOW (ref 36.0–46.0)
Hemoglobin: 12.3 g/dL (ref 12.0–15.0)
Immature Granulocytes: 1 %
Lymphocytes Relative: 37 %
Lymphs Abs: 3.2 10*3/uL (ref 0.7–4.0)
MCH: 30.1 pg (ref 26.0–34.0)
MCHC: 34.4 g/dL (ref 30.0–36.0)
MCV: 87.5 fL (ref 80.0–100.0)
Monocytes Absolute: 0.7 10*3/uL (ref 0.1–1.0)
Monocytes Relative: 8 %
Neutro Abs: 4.1 10*3/uL (ref 1.7–7.7)
Neutrophils Relative %: 47 %
Platelet Count: 232 10*3/uL (ref 150–400)
RBC: 4.09 MIL/uL (ref 3.87–5.11)
RDW: 12.5 % (ref 11.5–15.5)
WBC Count: 8.6 10*3/uL (ref 4.0–10.5)
nRBC: 0 % (ref 0.0–0.2)

## 2022-08-01 LAB — LACTATE DEHYDROGENASE: LDH: 161 U/L (ref 98–192)

## 2022-08-01 NOTE — Progress Notes (Signed)
[place] [phone]) [phone]   Fax:([phone]) [phone]  PROGRESS NOTE  Patient Care Team: Lindell Spar, MD as PCP - General (Internal Medicine) Danie Binder, MD (Inactive) as Consulting Physician (Gastroenterology) Minus Breeding, MD as Consulting Physician (Cardiology) Annitta Needs, NP (Gastroenterology) Carlis Stable, NP as Nurse Practitioner (Gastroenterology) Montez Morita, Quillian Quince, MD as Consulting Physician (Gastroenterology) Eloise Harman, DO as Consulting Physician (Internal Medicine) Cameron Sprang, MD as Consulting Physician (Neurology)  Hematological/Oncological History  # Low Grade Marginal Zone Lymphoma, Stage III 1) 06/06/2019: patient underwent a diagnostic mammogram of the left breast. Calcifications noted in the left breast, recommended 6 month f/u imaging. 2) 02/02/2020: bilateral diagnostic mammogram showed abnormal enlarged lymph nodes with cortical thickening. The largest of these measures 2.2 x 1.7 x 2.0 cm.  3) 02/02/2020: US guided biopsy of left axillary lymph nodes performed. 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 The overall features are atypical and highly suspicious for non-Hodgkin B-cell lymphoma, particularly marginal zone lymphoma. 4) 02/16/2020: establish care with Dr. Lorenso Courier  5) 02/29/2020: PET CT scan performed, showed hypermetabolic lymph nodes identified in the posterior left neck, both axillary/subpectoral regions, mediastinum, hila, right external iliac chain, and right groin. Massive splenomegaly of 17.8 cm noted as well.  6) 03/20/2020: excisional biopsy of left posterior cervical lymph node. Biopsy confirmed most likely low grade marginal zone lymphoma.   7) 7/14-7/15/2021: Cycle 1 Day 1 of Rituximab/Bendamycin 8) 03/30/2020-04/01/2020: admitted inpatient for hematemesis. Noted to have marked esophagitis on EGD.  9) 04/25/2020: IV feraheme '510mg'$   administered 10) 04/26/2020: Cycle 2 Day 1 of Rituximab/Bendamycin 11) 05/10/2020: HOLD R-Benda in setting of colitis, GI bleed, and intolerance to therapy. Plan to start monotherapy Rituximab once patient has rebounded.  12) 07/06/2020: Cycle 1 Week 1 of monotherapy rituximab 13) 07/30/2020: Cycle 1 Week 3 of monotherapy rituximab 14) 08/06/2020: Completed Cycle 1 of monotherapy rituximab 15)  09/12/2020: PET CT scan shows completed resolution of tumor and FDG avid lesions. Deauville Score 1. Complete response.  16) 11/16/2020: Cycle 1 Day 1 of Maintenance Rituximab  17) 01/10/2021:  Cycle 2 Day 1 of Maintenance Rituximab  18) 03/07/2021: Cycle 3 Day 1 of Maintenance Rituximab  19) 05/09/2021: Cycle 4 Day 1 of Maintenance Rituximab  20) 08/12/2021: PET CT scan showed no evidence of residual recurrent disease.  Interval History:  Elizabeth Mcintyre 81 y.o. 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   On exam today, Elizabeth Mcintyre reports she has been great over the last 6 months.  Her energy is quite strong and her appetite has been "too good".  She notes her weight is increasing and she is up to 133 pounds from 130 pounds in August.  She reports that she is not having any trouble with nausea, vomiting, or constipation, however she does have some occasional bouts of diarrhea.  She notes that she did have a cat bite on her left pointer finger and developed some redness.  She did get shots, tetanus, and antibiotics help with this.  Her erythema and tenderness have improved.  Her finger is almost now back to normal.  Overall she does not has any signs or symptoms concerning for recurrence of disease.  She does not have any lymphadenopathy, fevers, chills, sweats, nausea, vomiting or diarrhea. The rest of the 10 point ROS is negative.    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   Lower GI bleeding 04/30/2020   Lymphadenopathy 01/23/2020   Osteopenia 2017   Last  bone density 05/04/2017: -2.4   PONV (postoperative nausea and vomiting)    Schatzki's ring    Stress incontinence    Stroke ([place])    mini stroke - found on a CT scan    SURGICAL HISTORY: Past Surgical History:  Procedure Laterality Date   ABDOMINAL HYSTERECTOMY     BALLOON DILATION N/A 08/21/2020   Procedure: BALLOON DILATION;  Surgeon: Eloise Harman, DO;  [facility];  Service: Endoscopy;  Laterality: N/A;   BIOPSY  03/30/2020   Procedure: BIOPSY;  Surgeon: Ronald Lobo, MD;  Location: [facility];  Service: Endoscopy;;   BIOPSY  05/02/2020   Procedure: BIOPSY;  Surgeon: Montez Morita, Quillian Quince, MD;  Location: [facility];  Service: Gastroenterology;;   CARDIAC CATHETERIZATION     X 2, last one in [place] 04/13/2017   Procedure: LAPAROSCOPIC CHOLECYSTECTOMY;  Surgeon: Aviva Signs, MD;  Location: [facility];  Service: General;  Laterality: N/A;   COLONOSCOPY     COLONOSCOPY  May 2012   Dr. Olevia Perches: mild diverticulosis, otherwise normal.    COLONOSCOPY WITH PROPOFOL N/A 05/02/2020   Dr. Jenetta Downer: 8 mm polyp removed from the ascending colon, 2 mm polyp removed from the ascending colon.  Tubular adenomas.  Diverticulosis.  Mucosal ulceration in  the sigmoid colon noted, pathology consistent with ischemic colitis.   ESOPHAGOGASTRODUODENOSCOPY N/A 01/28/2015   Dr. Gala Romney: reflux esophagitis, Schatzki's ring not manipulated due to recent bleeding   ESOPHAGOGASTRODUODENOSCOPY N/A 03/30/2015   Dr. Gala Romney: Schatzki's ring s/p Venia Minks dilation, previously noted esophageal ulcer completely healed   ESOPHAGOGASTRODUODENOSCOPY N/A 03/30/2020   Buccini: Moderately severe erosive, circumferential, confluent esophagitis with no bleeding found 25 to 40 cm from incisors.  Nonobstructing and mild Schatzki ring, there were also multiple distal esophageal rings noted, minimal hiatal hernia.   ESOPHAGOGASTRODUODENOSCOPY (EGD) WITH PROPOFOL N/A 08/21/2020   Procedure: ESOPHAGOGASTRODUODENOSCOPY (EGD) WITH PROPOFOL;  Surgeon: Eloise Harman, DO;  [facility];  Service: Endoscopy;  Laterality: N/A;  2:15pm   IR IMAGING GUIDED PORT INSERTION  03/13/2020   IR REMOVAL TUN ACCESS W/ PORT W/O FL MOD SED  10/01/2021   LYMPH NODE BIOPSY Left 03/20/2020   Procedure: LEFT POSTERIOR CERVICAL LYMPH NODE BIOPSY;  Surgeon: Georganna Skeans, MD;  Location: [place];  Service: General;  Laterality: Left;   MALONEY DILATION N/A 03/30/2015   Procedure: Keturah Shavers;  Surgeon: Daneil Dolin, MD;  [facility];  Service: Endoscopy;  Laterality: N/A;   POLYPECTOMY  05/02/2020  Procedure: POLYPECTOMY;  Surgeon: Harvel Quale, MD;  [facility];  Service: Gastroenterology;;    SOCIAL HISTORY: Social History   Socioeconomic History   Marital status: Widowed    Spouse name: Not on file   Number of children: 2   Years of education: Not on file   Highest education level: Not on file  Occupational History   Occupation: retired  Tobacco Use   Smoking status: Never   Smokeless tobacco: Never  Vaping Use   Vaping Use: Never used  Substance and Sexual Activity   Alcohol use: No   Drug use: No   Sexual activity: Never  Other Topics  Concern   Not on file  Social History Narrative   Ms. Burek is widowed. Her young grandson lives with her, for whom she shares custody with her daughter, the son's aunt.     Right handed    Social Determinants of Health   Financial Resource Strain: Low Risk  (06/04/2021)   Overall Financial Resource Strain (CARDIA)    Difficulty of Paying Living Expenses: Not hard at all  Food Insecurity: No Food Insecurity (06/04/2021)   Hunger Vital Sign    Worried About Running Out of Food in the Last Year: Never true    Ran Out of Food in the Last Year: Never true  Transportation Needs: No Transportation Needs (06/04/2021)   PRAPARE - Hydrologist (Medical): No    Lack of Transportation (Non-Medical): No  Physical Activity: Insufficiently Active (06/04/2021)   Exercise Vital Sign    Days of Exercise per Week: 5 days    Minutes of Exercise per Session: 10 min  Stress: No Stress Concern Present (06/04/2021)   [place]    Feeling of Stress : Only a little  Social Connections: Socially Isolated (06/04/2021)   Social Connection and Isolation Panel [NHANES]    Frequency of Communication with Friends and Family: More than three times a week    Frequency of Social Gatherings with Friends and Family: More than three times a week    Attends Religious Services: Never    Marine scientist or Organizations: No    Attends Archivist Meetings: Never    Marital Status: Widowed  Intimate Partner Violence: Not At Risk (06/04/2021)   Humiliation, Afraid, Rape, and Kick questionnaire    Fear of Current or Ex-Partner: No    Emotionally Abused: No    Physically Abused: No    Sexually Abused: No    FAMILY HISTORY: Family History  Problem Relation Age of Onset   Heart disease Mother    Osteoarthritis Mother    Sudden death Father    Single kidney Father    Other Father        h/o severe MVA injuries    Hyperlipidemia Sister    Other Daughter        Myalgias   Fibromyalgia Daughter    Allergies Daughter    Heart disease Maternal Grandfather    Sudden death Paternal Grandmother    Diabetes Paternal Grandfather    Heart disease Daughter    Other Daughter        palpitations   Pulmonary fibrosis Maternal Aunt    Cancer Paternal Uncle    Pulmonary fibrosis Maternal Aunt    Colon cancer Neg Hx     ALLERGIES:  is allergic to codeine phosphate.  MEDICATIONS:  Current Outpatient Medications  Medication  Sig Dispense Refill   amLODipine (NORVASC) 10 MG tablet Take 10 mg by mouth daily.     acetaminophen (TYLENOL) 325 MG tablet Take 325 mg by mouth every 6 (six) hours as needed for moderate pain.      albuterol (VENTOLIN HFA) 108 (90 Base) MCG/ACT inhaler Inhale 2 puffs into the lungs every 6 (six) hours as needed for wheezing. 8.5 g 2   Ascorbic Acid (VITAMIN C) 100 MG tablet Take 100 mg by mouth daily.     cetirizine (ZYRTEC) 10 MG tablet Take 10 mg by mouth at bedtime. As needed     Cholecalciferol (VITAMIN D3) 25 MCG (1000 UT) CAPS Take 1,000 Units by mouth daily.      donepezil (ARICEPT) 10 MG tablet TAKE 1 TABLET BY MOUTH DAILY 30 tablet 3   levothyroxine (SYNTHROID) 75 MCG tablet TAKE 1 TABLET(75 MCG) BY MOUTH DAILY 30 tablet 3   lidocaine-prilocaine (EMLA) cream Apply 1 application topically as needed. (Patient taking differently: Apply 1 application  topically as needed (port access).) 30 g 1   mirtazapine (REMERON) 7.5 MG tablet Take 1 tablet (7.5 mg total) by mouth at bedtime. 30 tablet 5   pantoprazole (PROTONIX) 40 MG tablet TAKE 1 TABLET(40 MG) BY MOUTH DAILY 90 tablet 3   rOPINIRole (REQUIP) 0.25 MG tablet Take 1 tablet (0.25 mg total) by mouth at bedtime. 90 tablet 1   rosuvastatin (CRESTOR) 20 MG tablet Take 1 tablet (20 mg total) by mouth daily. 90 tablet 3   sucralfate (CARAFATE) 1 g tablet Take 1 g by mouth 4 (four) times daily -  with meals and at bedtime. As needed      tiZANidine (ZANAFLEX) 4 MG tablet Take 1 tablet (4 mg total) by mouth at bedtime as needed for muscle spasms. Do not take with alcohol or while driving or operating heavy machinery 30 tablet 0   vitamin B-12 (CYANOCOBALAMIN) 1000 MCG tablet Take 1,000 mcg by mouth daily.      No current facility-administered medications for this visit.    REVIEW OF SYSTEMS:   Constitutional: ( - ) fevers, ( - )  chills , ( - ) night sweats Eyes: ( - ) blurriness of vision, ( - ) double vision, ( - ) watery eyes Ears, nose, mouth, throat, and face: ( - ) mucositis, ( - ) sore throat Respiratory: ( - ) cough, ( - ) dyspnea, ( - ) wheezes Cardiovascular: ( - ) palpitation, ( - ) chest discomfort, ( - ) lower extremity swelling Gastrointestinal:  ( - ) nausea, ( - ) heartburn, ( - ) change in bowel habits Skin: ( - ) abnormal skin rashes Lymphatics: ( - ) new lymphadenopathy, ( - ) easy bruising Neurological: ( - ) numbness, ( - ) tingling, ( - ) new weaknesses Behavioral/Psych: ( - ) mood change, ( - ) new changes  All other systems were reviewed with the patient and are negative.  PHYSICAL EXAMINATION: ECOG PERFORMANCE STATUS: 1 - Symptomatic but completely ambulatory  Vitals:   08/01/22 1507  BP: (!) 147/89  Pulse: 92  Resp: 16  Temp: (!) 97.5 F (36.4 C)  SpO2: 99%    Filed Weights   08/01/22 1507  Weight: 133 lb 14.4 oz (60.7 kg)   GENERAL: well appearing elderly Caucasian female in NAD  SKIN: skin color, texture, turgor are normal. EYES: conjunctiva are pink and non-injected, sclera clear NECK: supple, non-tender.  LYMPH: no cervical, supraclavicular, or axillary lymph nodes palpated.  LUNGS: clear to auscultation and percussion with normal breathing effort HEART: regular rate & rhythm and no murmurs and no lower extremity edema Musculoskeletal: no cyanosis of digits and no clubbing  PSYCH: alert & oriented x 3, fluent speech NEURO: no focal motor/sensory deficits  LABORATORY DATA:   I have reviewed the data as listed    Latest Ref Rng & Units 08/01/2022    2:39 PM 05/05/2022    2:13 PM 01/31/2022    1:49 PM  CBC  WBC 4.0 - 10.5 K/uL 8.6  7.4  9.5   Hemoglobin 12.0 - 15.0 g/dL 12.3  13.3  14.3   Hematocrit 36.0 - 46.0 % 35.8  39.7  40.8   Platelets 150 - 400 K/uL 232  204  211        Latest Ref Rng & Units 07/09/2022    2:26 PM 05/05/2022    2:13 PM 01/31/2022    1:49 PM  CMP  Glucose 70 - 99 mg/dL 98  108  102   BUN 8 - 27 mg/dL '18  14  30   '$ Creatinine 0.57 - 1.00 mg/dL 1.39  1.27  1.46   Sodium 134 - 144 mmol/L 143  142  141   Potassium 3.5 - 5.2 mmol/L 4.6  4.5  4.4   Chloride 96 - 106 mmol/L 102  103  105   CO2 20 - 29 mmol/L '23  22  28   '$ Calcium 8.7 - 10.3 mg/dL 10.4  9.4  9.6   Total Protein 6.0 - 8.5 g/dL  6.6  7.4   Total Bilirubin 0.0 - 1.2 mg/dL  0.4  0.9   Alkaline Phos 44 - 121 IU/L  83  80   AST 0 - 40 IU/L  17  22   ALT 0 - 32 IU/L  19  15     RADIOGRAPHIC STUDIES: No results found.  ASSESSMENT & PLAN Elizabeth Mcintyre 81 y.o. 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  Previously I recommend a rituximab monotherapy given her intolerance of BR x 2 cycles. This regimen consisted of rituximab 375 mg/m2 IV q weekly x 4 weeks. During her scans on 8/16 - 05/03/2020 she was shown to have marked response to therapy with normalization of spleen size and resolution of lymphadenopathy. Repeat PET CT scan on 09/12/20 after completed of rituximab monotherapy showed a completed response. We will proceed with q 2 months maintenance/consolidation rituximab x 4 cycles.   GELF Criteria: 1 (splenomegaly). Indication for treatment  # Low Grade Marginal Zone Lymphoma, Stage III -- discontinued bendamustine + rituximab as patient is unable to tolerate this treatment. Will pursue rituximab monotherapy instead. Started therapy with Cycle 1 Day 1 on 03/28/2020 BR, decreased to Ritux  alone q weekly x 4 starting 07/06/2020. --patient completed excisional lymph node biopsy to confirm the diagnosis. Pathological exam of the lymph node confirms marginal zone lymphoma.  --PET CT scan confirms stage III disease. Patient meets GELF criteria for treatment (splenomegaly). She did not have any B symptoms and her counts were stable at diagnosis, with some mild thrombocytopenia.  --Patient completed monotherapy rituximab x 4 weeks on 08/06/2020.  --post treatment PET CT scan from 09/12/20 showed Deauville Score 1, complete response to therapy. Recommend to proceed with q 8 week rituximab '375mg'$ /m2 maintenance therapy. This was continued for 4 doses --Patient started C1D1 of maintenance Rituximab on 11/16/2020.  --Patient completed 4  cycles of maintenance Rituximab.  Plan: -- Labs today show white blood cell count 8.6, hemoglobin 12.3, MCV 87.5, and platelets of 232 -- PET scan performed on 08/13/2021 showed no evidence of residual disease.  CT scan in May 2022 showed no evidence of recurrent disease/relapse.  Because that scan shows no evidence of residual recurrent disease no further scans are recommended. --Return to the clinic in 6 months for continued monitoring.  No orders of the defined types were placed in this encounter.  All questions were answered. The patient knows to call the clinic with any problems, questions or concerns.  A total of more than 30 minutes were spent on this encounter and over half of that time was spent on counseling and coordination of care as outlined above.   Ledell Peoples, MD Department of Hematology/Oncology [place] at [facility] Phone: [phone] Pager: [phone] Email: [email or web address]  08/01/2022 3:18 PM   Literature Support:  NCCN Guidelines: Consolidation with rituximab should be offered if initially treated with single agent rituximab. (MS-63)  UptoDate: Different schedules have been used. The  following administration schedules were used in the randomized trials and are equally acceptable approaches:  ?Rituximab 375 mg/m2 intravenously per week for a total of four doses  ?Rituximab 375 mg/m2 intravenously per week for four weeks, followed by four additional doses administered every two months

## 2022-08-04 ENCOUNTER — Telehealth: Payer: Self-pay | Admitting: Hematology and Oncology

## 2022-08-04 NOTE — Telephone Encounter (Signed)
Per 11/17 los called and spoke to pt about appointments

## 2022-08-06 ENCOUNTER — Other Ambulatory Visit: Payer: Self-pay | Admitting: Internal Medicine

## 2022-08-06 DIAGNOSIS — F339 Major depressive disorder, recurrent, unspecified: Secondary | ICD-10-CM

## 2022-08-14 ENCOUNTER — Encounter: Payer: Self-pay | Admitting: Physician Assistant

## 2022-08-14 ENCOUNTER — Ambulatory Visit: Payer: Medicare HMO | Admitting: Physician Assistant

## 2022-08-14 VITALS — BP 116/66 | HR 106 | Resp 20 | Ht 62.0 in | Wt 133.0 lb

## 2022-08-14 DIAGNOSIS — F0280 Dementia in other diseases classified elsewhere without behavioral disturbance: Secondary | ICD-10-CM | POA: Diagnosis not present

## 2022-08-14 DIAGNOSIS — G301 Alzheimer's disease with late onset: Secondary | ICD-10-CM

## 2022-08-14 DIAGNOSIS — G3109 Other frontotemporal dementia: Secondary | ICD-10-CM

## 2022-08-14 DIAGNOSIS — F02A0 Dementia in other diseases classified elsewhere, mild, without behavioral disturbance, psychotic disturbance, mood disturbance, and anxiety: Secondary | ICD-10-CM

## 2022-08-14 NOTE — Patient Instructions (Signed)
It was a pleasure to see you today at our office.   Recommendations:  Follow up in  6 months Repeat neurocognitive testing  Continue donepezil 10 mg daily. Side effects were discussed  Continue the mood medications as per primary doctor   Whom to call:  Memory  decline, memory medications: Call out office [phone]   For psychiatric meds, mood meds: Please have your primary care physician manage these medications.   Counseling regarding caregiver distress, including caregiver depression, anxiety and issues regarding community resources, adult day care programs, adult living facilities, or memory care questions:   Feel free to contact [place], Social Worker at [phone]   For assessment of decision of mental capacity and competency:  Call Dr. Anthoney Harada, geriatric psychiatrist at [phone]  For guidance in geriatric dementia issues please call [facility] [phone]  For guidance regarding [facility] and if placement were needed at the facility, contact Arnell Asal, Social Worker tel: [phone]  If you have any severe symptoms of a stroke, or other severe issues such as confusion,severe chills or fever, etc call 911 or go to the ER as you may need to be evaluate further  Consider [facility]  [place], [place] [address] [phone]  Hours of Operation Mondays to Thursdays: 8 am to 8 pm,Fridays: 9 am to 8 pm, Saturdays: 9 am to 1 pm Sundays: Closed  [email or web address]-[email or web address]-[email or web address]    Feel free to visit Facebook page " [email or web address]" for tips of how to care for people with memory problems.         RECOMMENDATIONS FOR ALL PATIENTS WITH MEMORY PROBLEMS: 1. Continue to exercise (Recommend 30 minutes of walking everyday, or 3 hours every week) 2. Increase social interactions - continue going to [place] and enjoy social gatherings  with friends and family 3. Eat healthy, avoid fried foods and eat more fruits and vegetables 4. Maintain adequate blood pressure, blood sugar, and blood cholesterol level. Reducing the risk of stroke and cardiovascular disease also helps promoting better memory. 5. Avoid stressful situations. Live a simple life and avoid aggravations. Organize your time and prepare for the next day in anticipation. 6. Sleep well, avoid any interruptions of sleep and avoid any distractions in the bedroom that may interfere with adequate sleep quality 7. Avoid sugar, avoid sweets as there is a strong link between excessive sugar intake, diabetes, and cognitive impairment We discussed the Mediterranean diet, which has been shown to help patients reduce the risk of progressive memory disorders and reduces cardiovascular risk. This includes eating fish, eat fruits and green leafy vegetables, nuts like almonds and hazelnuts, walnuts, and also use olive oil. Avoid fast foods and fried foods as much as possible. Avoid sweets and sugar as sugar use has been linked to worsening of memory function.  There is always a concern of gradual progression of memory problems. If this is the case, then we may need to adjust level of care according to patient needs. Support, both to the patient and caregiver, should then be put into place.    The Alzheimer[employer] Association is here all day, every day for people facing Alzheimer's disease through our free 24/7 Helpline: (223)665-1989. The Helpline provides reliable information and support to all those who need assistance, such as individuals living with memory loss, Alzheimer's or other dementia, caregivers, health care professionals and the public.  Our highly trained and knowledgeable staff can help you with: Understanding memory loss, dementia and Alzheimer's  Medications and other treatment options  General information about aging and brain health  Skills to provide quality care and to find  the best care from professionals  Legal, financial and living-arrangement decisions Our Helpline also features: Confidential care consultation provided by master's level clinicians who can help with decision-making support, crisis assistance and education on issues families face every day  Help in a caller's preferred language using our translation service that features more than 200 languages and dialects  Referrals to local community programs, services and ongoing support     FALL PRECAUTIONS: Be cautious when walking. Scan the area for obstacles that may increase the risk of trips and falls. When getting up in the mornings, sit up at the edge of the bed for a few minutes before getting out of bed. Consider elevating the bed at the head end to avoid drop of blood pressure when getting up. Walk always in a well-lit room (use night lights in the walls). Avoid area rugs or power cords from appliances in the middle of the walkways. Use a walker or a cane if necessary and consider physical therapy for balance exercise. Get your eyesight checked regularly.  FINANCIAL OVERSIGHT: Supervision, especially oversight when making financial decisions or transactions is also recommended.  HOME SAFETY: Consider the safety of the kitchen when operating appliances like stoves, microwave oven, and blender. Consider having supervision and share cooking responsibilities until no longer able to participate in those. Accidents with firearms and other hazards in the house should be identified and addressed as well.   ABILITY TO BE LEFT ALONE: If patient is unable to contact 911 operator, consider using LifeLine, or when the need is there, arrange for someone to stay with patients. Smoking is a fire hazard, consider supervision or cessation. Risk of wandering should be assessed by caregiver and if detected at any point, supervision and safe proof recommendations should be instituted.  MEDICATION SUPERVISION: Inability  to self-administer medication needs to be constantly addressed. Implement a mechanism to ensure safe administration of the medications.   DRIVING: Regarding driving, in patients with progressive memory problems, driving will be impaired. We advise to have someone else do the driving if trouble finding directions or if minor accidents are reported. Independent driving assessment is available to determine safety of driving.   If you are interested in the driving assessment, you can contact the following:  [employer] in [place]  [place] [phone] or [phone]      [place] refers to food and lifestyle choices that are based on the traditions of countries located on the [employer]. This way of eating has been shown to help prevent certain conditions and improve outcomes for people who have chronic diseases, like kidney disease and heart disease. What are tips for following this plan? Lifestyle  Cook and eat meals together with your family, when possible. Drink enough fluid to keep your urine clear or pale yellow. Be physically active every day. This includes: Aerobic exercise like running or swimming. Leisure activities like gardening, walking, or housework. Get 7-8 hours of sleep each night. If recommended by your health care provider, drink red wine in moderation. This means 1 glass a day for nonpregnant women and 2 glasses a day for men. A glass of wine equals 5 oz (150 mL). Reading food labels  Check the serving size of packaged foods. For foods such as rice and  pasta, the serving size refers to the amount of cooked product, not dry. Check the total fat in packaged foods. Avoid foods that have saturated fat or trans fats. Check the ingredients list for added sugars, such as corn syrup. Shopping  At the grocery store, buy  most of your food from the areas near the walls of the store. This includes: Fresh fruits and vegetables (produce). Grains, beans, nuts, and seeds. Some of these may be available in unpackaged forms or large amounts (in bulk). Fresh seafood. Poultry and eggs. Low-fat dairy products. Buy whole ingredients instead of prepackaged foods. Buy fresh fruits and vegetables in-season from local farmers markets. Buy frozen fruits and vegetables in resealable bags. If you do not have access to quality fresh seafood, buy precooked frozen shrimp or canned fish, such as tuna, salmon, or sardines. Buy small amounts of raw or cooked vegetables, salads, or olives from the deli or salad bar at your store. Stock your pantry so you always have certain foods on hand, such as olive oil, canned tuna, canned tomatoes, rice, pasta, and beans. Cooking  Cook foods with extra-virgin olive oil instead of using butter or other vegetable oils. Have meat as a side dish, and have vegetables or grains as your main dish. This means having meat in small portions or adding small amounts of meat to foods like pasta or stew. Use beans or vegetables instead of meat in common dishes like chili or lasagna. Experiment with different cooking methods. Try roasting or broiling vegetables instead of steaming or sauteing them. Add frozen vegetables to soups, stews, pasta, or rice. Add nuts or seeds for added healthy fat at each meal. You can add these to yogurt, salads, or vegetable dishes. Marinate fish or vegetables using olive oil, lemon juice, garlic, and fresh herbs. Meal planning  Plan to eat 1 vegetarian meal one day each week. Try to work up to 2 vegetarian meals, if possible. Eat seafood 2 or more times a week. Have healthy snacks readily available, such as: Vegetable sticks with hummus. Greek yogurt. Fruit and nut trail mix. Eat balanced meals throughout the week. This includes: Fruit: 2-3 servings a day Vegetables: 4-5  servings a day Low-fat dairy: 2 servings a day Fish, poultry, or lean meat: 1 serving a day Beans and legumes: 2 or more servings a week Nuts and seeds: 1-2 servings a day Whole grains: 6-8 servings a day Extra-virgin olive oil: 3-4 servings a day Limit red meat and sweets to only a few servings a month What are my food choices? Mediterranean diet Recommended Grains: Whole-grain pasta. Brown rice. Bulgar wheat. Polenta. Couscous. Whole-wheat bread. [place]. Vegetables: Artichokes. Beets. Broccoli. Cabbage. Carrots. Eggplant. Green beans. Chard. Kale. Spinach. Onions. Leeks. Peas. Squash. Tomatoes. Peppers. Radishes. Fruits: Apples. Apricots. Avocado. Berries. Bananas. Cherries. Dates. Figs. Grapes. Lemons. Melon. Oranges. Peaches. Plums. Pomegranate. Meats and other protein foods: Beans. Almonds. Sunflower seeds. Pine nuts. Peanuts. [place]. Salmon. Scallops. Shrimp. [place]. Tilapia. Clams. Oysters. Eggs. Dairy: Low-fat milk. Cheese. Greek yogurt. Beverages: Water. Red wine. Herbal tea. Fats and oils: Extra virgin olive oil. Avocado oil. Grape seed oil. Sweets and desserts: [place] yogurt with honey. Baked apples. Poached pears. Trail mix. Seasoning and other foods: Basil. Cilantro. Coriander. Cumin. Mint. Parsley. Sage. Rosemary. Tarragon. Garlic. Oregano. Thyme. Pepper. Balsalmic vinegar. Tahini. Hummus. Tomato sauce. Olives. Mushrooms. Limit these Grains: Prepackaged pasta or rice dishes. Prepackaged cereal with added sugar. Vegetables: Deep fried potatoes (french fries). Fruits: Fruit canned in syrup. Meats and other protein  foods: Beef. Pork. Lamb. Poultry with skin. Hot dogs. [place] Salines. Dairy: Ice cream. Sour cream. Whole milk. Beverages: Juice. Sugar-sweetened soft drinks. Beer. Liquor and spirits. Fats and oils: Butter. Canola oil. Vegetable oil. Beef fat (tallow). Lard. Sweets and desserts: Cookies. Cakes. Pies. Candy. Seasoning and other foods: Mayonnaise. Premade sauces and  marinades. The items listed may not be a complete list. Talk with your dietitian about what dietary choices are right for you. Summary The Mediterranean diet includes both food and lifestyle choices. Eat a variety of fresh fruits and vegetables, beans, nuts, seeds, and whole grains. Limit the amount of red meat and sweets that you eat. Talk with your health care provider about whether it is safe for you to drink red wine in moderation. This means 1 glass a day for nonpregnant women and 2 glasses a day for men. A glass of wine equals 5 oz (150 mL). This information is not intended to replace advice given to you by your health care provider. Make sure you discuss any questions you have with your health care provider. Document Released: 04/24/2016 Document Revised: 05/27/2016 Document Reviewed: 04/24/2016 Elsevier Interactive Patient Education  2017 [employer].    e

## 2022-08-14 NOTE — Progress Notes (Signed)
Assessment/Plan:   Dementia likely due to Alzheimer's Disease  Elizabeth Mcintyre is a very pleasant 81 y.o. 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 Patient is currently on . MRI brain personally reviewed was remarkable for asymmetric atrophy of the anterior right temporal lobe. Neurocognitive testing on 03/27/2021 indicated mild dementia likely due to Alzheimer's disease, right temporal variant FTD.  She is on donepezil 10 mg daily.. Memory is stable, but she complains of more Deja Vous than prior.and she is learning to cope with them.        Follow up in 6  months. Continue donepezil 10 mg daily. Side effects were discussed  Repeat Neuropsych testing for clarity of diagnosis and disease trajectory, scheduled for 11/12/22   Patient declines antidepressant therapy at this time, she will discuss with PCP Consider discontinuing driving given the  deja vous     Subjective:    This patient is accompanied in the office by her daughter who supplements the history.  Previous records as well as any outside records available were reviewed prior to todays visit. She was last seen on 02/11/22 at which time her MMSE was 24/30 .   Any changes in memory since last visit? "Ehh" . e is able to recall conversations and names of people, but she is "bothered by the Elizabeth Mcintyre". LTM is normal  repeats oneself?  Endorsed. After 3 pm, then repeats herself  Disoriented when walking into a room?  Patient denies   Leaving objects in unusual places?  She is constantly rearranging the room, rearranging stuff   Ambulates  with difficulty?   Patient denies   Recent falls?  Patient denies   Any head injuries?  Patient denies   History of seizures?   Patient denies   Wandering behavior?  Patient denies   Patient drives?  Short distances. Sometimes she gets mixed up with locations, and she  calls her daughter. Daughter is afraid of her driving in view of deja vous   Any mood changes since last visit?  Patient denies   Any worsening depression?:  Patient denies   Hallucinations?  Patient denies   Paranoia?  Patient denies   Patient reports that sleeps well with vivid dreams, REM behavior or sleepwalking   History of sleep apnea?  Patient denies   Any hygiene concerns?  Patient denies   Independent of bathing and dressing?  Endorsed  Does the patient needs help with medications? Daughter  in charge  Who is in charge of the finances?  Patient is in charge but is "questionable" Daughter entertaining taking over Any changes in appetite?  Patient denies   Patient have trouble swallowing? Patient denies   Does the patient cook?  Patient denies   Any kitchen accidents such as leaving the stove on? Patient denies   Any headaches?  Patient denies   Double vision? Patient denies   Any focal numbness or tingling?  Patient denies   Chronic back pain Patient denies   Unilateral weakness?  Patient denies   Any tremors?  Patient denies   Any history of anosmia?  Patient denies   Any incontinence of urine? Endorsed, wears pads recent UTI 2 months ago  Any bowel dysfunction?   Patient denies      Patient lives with: daughter  Neuropsychological testing on 03/27/2021:  demonstrating "cognitive impairment amounting to a dementia level problem.  While she does have memory impairment and naming problems, her memory is relatively better preserved than typical for AD at this point in the illness. She has asymmetric atrophy of the anterior right temporal lobe on structural MRI, a radiologic presentation associated with the so-called "right temporal variant FTD."  Considering base rates and the patients demographics, Alzheimer's is certainly high on the differential and she does meet criteria for AD, although I am also worried about other proteinopathies (e.g., TDP-43 and Tau) more often associated with  this clinic-radiologic entity. I think it is clear that this is neurodegenerative, and thus that there will be progression across time. If diagnostic clarity is a goal of care, LP for CSF biomarkers could be considered, although this wouldn't necessarily change her prognosis. A trial of antidementia medication would also be appropriate. I have no major concerns about the patient's driving at this point in time but would recommend that make use of GPS or other safeguards and/or limit her driving to local trips given her difficulties with navigation, which are particularly common in rtvFTD. 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 She started noticing changes a year ago, stating "I don't have a memory." Her daughter Elizabeth Mcintyre started noticing changes around 2 years ago when she would get lost driving to [facility] house. She was repeating herself more. She had been diagnosed with lymphoma and they initially attributed this to "chemo fog." She is scheduled for 2 more sessions of immunotherapy then she will be done for now. Symptoms have been the same over the past year. She drives on occasion still and would get lost but find her way back or call her daughter. She would not recall how to get to her house of 30 years. She was missing bills/taxes 2 years ago, Elizabeth Mcintyre started helping her last year. When she tried to organize her bills and write them down, she would get confused and rearrange her system, then forget the system she made. Elizabeth Mcintyre has been helping with her pillbox but would come and see pills there a week later. She denies leaving the stove on. She misplaces things constantly. She has been living with a housemate for the past 12 years who stays on the other side of  the house, she is usually by herself. She is independent with dressing and bathing.    She reports a sensation on the left frontal region going down her face for the past month. She says if feels funny, her neck feels funny, "not pain," but like something running down. Sensation occurs on a daily basis. Her vision is more blurred on the left eye. She notices the left eyelid stays closed especially when she wakes up, she has to pull up her lid. There is some nausea, no vomiting. She describes the sensation is "like a fog," with headache 7/10 going down to 1/10. She has been taking Tylenol every night for the past month, it does not help much. She states that she was previously sleeping well, but for the past month, "I don't sleep." She did not sleep last night, she does not take naps. She denies any dizziness, dysarthria, back pain, focal numbness/tingling/weakness, bowel/bladder dysfunction, anosmia, tremors. She has shoulder pain. No family history of dementia, no history of significant  head injuries or alcohol use. She gets depressed once in a while, her brain is trying to figure out what she wants to do next. She has 2 homes and constantly moves things around. Elizabeth Mcintyre denies any paranoia or hallucinations, she knows the patient is overwhelmed because there are things to put in order.    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     PREVIOUS MEDICATIONS:   CURRENT MEDICATIONS:  Outpatient Encounter Medications as of 08/14/2022  Medication Sig   acetaminophen (TYLENOL) 325 MG tablet Take 325 mg by mouth every 6 (six) hours as needed for moderate pain.    amLODipine (NORVASC) 10 MG tablet Take 10 mg by mouth daily.   Ascorbic Acid (VITAMIN C) 100 MG tablet Take 100 mg by mouth daily.   cetirizine (ZYRTEC) 10 MG tablet Take 10  mg by mouth at bedtime. As needed   Cholecalciferol (VITAMIN D3) 25 MCG (1000 UT) CAPS Take 1,000 Units by mouth daily.    donepezil (ARICEPT) 10 MG tablet TAKE 1 TABLET BY MOUTH DAILY   levothyroxine (SYNTHROID) 75 MCG tablet TAKE 1 TABLET(75 MCG) BY MOUTH DAILY   lidocaine-prilocaine (EMLA) cream Apply 1 application topically as needed. (Patient taking differently: Apply 1 application  topically as needed (port access).)   mirtazapine (REMERON) 7.5 MG tablet TAKE 1 TABLET(7.5 MG) BY MOUTH AT BEDTIME   pantoprazole (PROTONIX) 40 MG tablet TAKE 1 TABLET(40 MG) BY MOUTH DAILY   rOPINIRole (REQUIP) 0.25 MG tablet Take 1 tablet (0.25 mg total) by mouth at bedtime.   rosuvastatin (CRESTOR) 20 MG tablet Take 1 tablet (20 mg total) by mouth daily.   sucralfate (CARAFATE) 1 g tablet Take 1 g by mouth 4 (four) times daily -  with meals and at bedtime. As needed   tiZANidine (ZANAFLEX) 4 MG tablet Take 1 tablet (4 mg total) by mouth at bedtime as needed for muscle spasms. Do not take with alcohol or while driving or operating heavy machinery   vitamin B-12 (CYANOCOBALAMIN) 1000 MCG tablet Take 1,000 mcg by mouth daily.    albuterol (VENTOLIN HFA) 108 (90 Base) MCG/ACT inhaler Inhale 2 puffs into the lungs every 6 (six) hours as needed for wheezing.   No facility-administered encounter medications on file as of 08/14/2022.       02/11/2022    3:00 PM 03/27/2021    2:00 PM 09/29/2018   10:40 AM  MMSE - Mini Mental State Exam  Orientation to time '4 5 5  '$ Orientation to Place '5 5 5  '$ Registration '3 3 3  '$ Attention/ Calculation '5 3 3  '$ Recall '1 2 2  '$ Language- name 2 objects '2 2 2  '$ Language- repeat '1 1 1  '$ Language- follow 3 step command '2 1 1  '$ Language- read & follow direction '1 1 1  '$ Write a sentence 0 1 1  Copy design 0 1 1  Total score '24 25 25      '$ 01/17/2021    9:00 AM  Montreal Cognitive Assessment   Visuospatial/ Executive (0/5) 1  Naming (0/3) 0  Attention: Read list of digits (0/2) 2   Attention: Read list of letters (0/1) 0  Attention: Serial 7 subtraction starting at 100 (0/3) 2  Language: Repeat phrase (0/2) 1  Language : Fluency (0/1) 0  Abstraction (0/2) 0  Delayed Recall (0/5) 1  Orientation (  0/6) 6  Total 13  Adjusted Score (based on education) 14    Objective:     PHYSICAL EXAMINATION:    VITALS:   Vitals:   08/14/22 1432  BP: 116/66  Pulse: (!) 106  Resp: 20  SpO2: 99%  Weight: 133 lb (60.3 kg)  Height: '5\' 2"'$  (1.575 m)    GEN:  The patient appears stated age and is in NAD. HEENT:  Normocephalic, atraumatic.   Neurological examination:  General: NAD, well-groomed, appears stated age. Orientation: The patient is alert. Oriented to person, place and date Cranial nerves: There is good facial symmetry.The speech is fluent and clear. No aphasia or dysarthria. Fund of knowledge is appropriate. Recent and remote memory are impaired. Attention and concentration are reduced.  Able to name objects and repeat phrases.  Hearing is intact to conversational tone.    Sensation: Sensation is intact to light touch throughout Motor: Strength is at least antigravity x4. DTR's 2/4 in UE/LE     Movement examination: Tone: There is normal tone in the UE/LE Abnormal movements:  no tremor.  No myoclonus.  No asterixis.   Coordination:  There is no decremation with RAM's. Normal finger to nose  Gait and Station: The patient has no difficulty arising out of a deep-seated chair without the use of the hands. The patient's stride length is good.  Gait is cautious and narrow.    Thank you for allowing [place] the opportunity to participate in the care of this nice patient. Please do not hesitate to contact us for any questions or concerns.   Total time spent on today's visit was 41 minutes dedicated to this patient today, preparing to see patient, examining the patient, ordering tests and/or medications and counseling the patient, documenting clinical information in the EHR  or other health record, independently interpreting results and communicating results to the patient/family, discussing treatment and goals, answering patient's questions and coordinating care.  Cc:  Lindell Spar, MD  Sharene Butters 08/15/2022 7:30 PM

## 2022-08-24 ENCOUNTER — Other Ambulatory Visit: Payer: Self-pay | Admitting: Internal Medicine

## 2022-08-24 DIAGNOSIS — E039 Hypothyroidism, unspecified: Secondary | ICD-10-CM

## 2022-10-09 ENCOUNTER — Encounter: Payer: Self-pay | Admitting: Internal Medicine

## 2022-10-09 ENCOUNTER — Ambulatory Visit (INDEPENDENT_AMBULATORY_CARE_PROVIDER_SITE_OTHER): Payer: Medicare HMO | Admitting: Internal Medicine

## 2022-10-09 VITALS — BP 134/69 | HR 76 | Ht 62.0 in | Wt 137.8 lb

## 2022-10-09 DIAGNOSIS — M8580 Other specified disorders of bone density and structure, unspecified site: Secondary | ICD-10-CM | POA: Diagnosis not present

## 2022-10-09 DIAGNOSIS — G2581 Restless legs syndrome: Secondary | ICD-10-CM | POA: Diagnosis not present

## 2022-10-09 DIAGNOSIS — C8580 Other specified types of non-Hodgkin lymphoma, unspecified site: Secondary | ICD-10-CM | POA: Diagnosis not present

## 2022-10-09 DIAGNOSIS — K2100 Gastro-esophageal reflux disease with esophagitis, without bleeding: Secondary | ICD-10-CM

## 2022-10-09 DIAGNOSIS — F02B18 Dementia in other diseases classified elsewhere, moderate, with other behavioral disturbance: Secondary | ICD-10-CM

## 2022-10-09 DIAGNOSIS — I10 Essential (primary) hypertension: Secondary | ICD-10-CM

## 2022-10-09 DIAGNOSIS — N3000 Acute cystitis without hematuria: Secondary | ICD-10-CM | POA: Diagnosis not present

## 2022-10-09 DIAGNOSIS — G309 Alzheimer's disease, unspecified: Secondary | ICD-10-CM

## 2022-10-09 DIAGNOSIS — N1831 Chronic kidney disease, stage 3a: Secondary | ICD-10-CM | POA: Diagnosis not present

## 2022-10-09 DIAGNOSIS — E039 Hypothyroidism, unspecified: Secondary | ICD-10-CM | POA: Diagnosis not present

## 2022-10-09 DIAGNOSIS — F339 Major depressive disorder, recurrent, unspecified: Secondary | ICD-10-CM

## 2022-10-09 HISTORY — DX: Acute cystitis without hematuria: N30.00

## 2022-10-09 NOTE — Assessment & Plan Note (Addendum)
Needs to take Pantoprazole regularly Needs to take at least 2 meals in a day

## 2022-10-09 NOTE — Progress Notes (Signed)
Established Patient Office Visit  Subjective:  Patient ID: Elizabeth Mcintyre, female    DOB: 1941-04-02  Age: 82 y.o. MRN: [id number]  CC:  Chief Complaint  Patient presents with   Hypertension    Three month follow up for hypertension and hypothyroidism     HPI Elizabeth Mcintyre is a 82 y.o. 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  HTN: BP is well-controlled. Takes medications regularly. Patient denies headache, dizziness, chest pain, dyspnea or palpitations.  Hypothyroidism: Her TSH was wnl in the last visit.  Of note, she has started taking levothyroxine regularly now.  Her daughter is helping her to take medicines regularly now.  Alzheimer's dementia: She has had episodes of confusion and insomnia.  She also has been feeling down, especially on cold and cloudy days.  She was prescribed Remeron, but has not not been taking it lately.  She is on Aricept for dementia, followed by neurology.  She also reports mild epigastric pain, but is not sure if she is taking pantoprazole.  She has history of GERD with esophagitis.  Denies any melena or hematochezia currently.  Of note, she admits that she has only 1 meal in a day.  She also complains of dysuria and urinary urgency for the last 1 week.  Denies any fever or chills.  Denies hematuria.  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   Lower GI bleeding 04/30/2020   Lymphadenopathy 01/23/2020   Osteopenia 2017   Last  bone density 05/04/2017: -2.4   PONV (postoperative nausea and vomiting)    Schatzki's ring    Stress incontinence    Stroke [facility])    mini stroke - found on a CT scan    Past Surgical History:  Procedure Laterality Date   ABDOMINAL HYSTERECTOMY     BALLOON DILATION N/A 08/21/2020   Procedure: BALLOON DILATION;  Surgeon: Eloise Harman, DO;  [facility];  Service: Endoscopy;  Laterality: N/A;   BIOPSY  03/30/2020   Procedure: BIOPSY;  Surgeon: Ronald Lobo, MD;  [facility];  Service: Endoscopy;;   BIOPSY  05/02/2020   Procedure: BIOPSY;  Surgeon: Harvel Quale, MD;  Location: [facility];  Service: Gastroenterology;;   CARDIAC CATHETERIZATION     X 2, last one in [place] 04/13/2017   Procedure: LAPAROSCOPIC CHOLECYSTECTOMY;  Surgeon: Aviva Signs, MD;  Location: [facility];  Service: General;  Laterality: N/A;   COLONOSCOPY     COLONOSCOPY  May  2012   Dr. Olevia Perches: mild diverticulosis, otherwise normal.    COLONOSCOPY WITH PROPOFOL N/A 05/02/2020   Dr. Jenetta Downer: 8 mm polyp removed from the ascending colon, 2 mm polyp removed from the ascending colon.  Tubular adenomas.  Diverticulosis.  Mucosal ulceration in the sigmoid colon noted, pathology consistent with ischemic colitis.   ESOPHAGOGASTRODUODENOSCOPY N/A 01/28/2015   Dr. Gala Romney: reflux esophagitis, Schatzki's ring not manipulated due to recent bleeding   ESOPHAGOGASTRODUODENOSCOPY N/A 03/30/2015   Dr. Gala Romney: Schatzki's ring s/p Venia Minks dilation, previously noted esophageal ulcer completely healed   ESOPHAGOGASTRODUODENOSCOPY N/A  03/30/2020   Buccini: Moderately severe erosive, circumferential, confluent esophagitis with no bleeding found 25 to 40 cm from incisors.  Nonobstructing and mild Schatzki ring, there were also multiple distal esophageal rings noted, minimal hiatal hernia.   ESOPHAGOGASTRODUODENOSCOPY (EGD) WITH PROPOFOL N/A 08/21/2020   Procedure: ESOPHAGOGASTRODUODENOSCOPY (EGD) WITH PROPOFOL;  Surgeon: Eloise Harman, DO;  [facility];  Service: Endoscopy;  Laterality: N/A;  2:15pm   IR IMAGING GUIDED PORT INSERTION  03/13/2020   IR REMOVAL TUN ACCESS W/ PORT W/O FL MOD SED  10/01/2021   LYMPH NODE BIOPSY Left 03/20/2020   Procedure: LEFT POSTERIOR CERVICAL LYMPH NODE BIOPSY;  Surgeon: Georganna Skeans, MD;  Location: [place];  Service: General;  Laterality: Left;   MALONEY DILATION N/A 03/30/2015   Procedure: Keturah Shavers;  Surgeon: Daneil Dolin, MD;  [facility];  Service: Endoscopy;  Laterality: N/A;   POLYPECTOMY  05/02/2020   Procedure: POLYPECTOMY;  Surgeon: Montez Morita, Quillian Quince, MD;  [facility];  Service: Gastroenterology;;    Family History  Problem Relation Age of Onset   Heart disease Mother    Osteoarthritis Mother    Sudden death Father    Single kidney Father    Other Father        h/o severe MVA injuries   Hyperlipidemia Sister    Other Daughter        Myalgias   Fibromyalgia Daughter    Allergies Daughter    Heart disease Maternal Grandfather    Sudden death Paternal Grandmother    Diabetes Paternal Grandfather    Heart disease Daughter    Other Daughter        palpitations   Pulmonary fibrosis Maternal Aunt    Cancer Paternal Uncle    Pulmonary fibrosis Maternal Aunt    Colon cancer Neg Hx     Social History   Socioeconomic History   Marital status: Widowed    Spouse name: Not on file   Number of children: 2   Years of education: Not on file   Highest education level: Not on file  Occupational History   Occupation: retired   Tobacco Use   Smoking status: Never   Smokeless tobacco: Never  Vaping Use   Vaping Use: Never used  Substance and Sexual Activity   Alcohol use: No   Drug use: No   Sexual activity: Never  Other Topics Concern   Not on file  Social History Narrative   Ms. Galli is widowed. Her young grandson lives with her, for whom she shares custody with her daughter, the son's aunt.     Right handed    Social Determinants of Health   Financial Resource Strain: Low Risk  (06/04/2021)   Overall Financial Resource Strain (CARDIA)    Difficulty of Paying Living Expenses: Not hard at all  Food Insecurity: No Food Insecurity (06/04/2021)   Hunger Vital Sign  Worried About Charity fundraiser in the Last Year: Never true    [place] in the Last Year: Never true  Transportation Needs: No Transportation Needs (06/04/2021)   PRAPARE - Hydrologist (Medical): No    Lack of Transportation (Non-Medical): No  Physical Activity: Insufficiently Active (06/04/2021)   Exercise Vital Sign    Days of Exercise per Week: 5 days    Minutes of Exercise per Session: 10 min  Stress: No Stress Concern Present (06/04/2021)   [place]    Feeling of Stress : Only a little  Social Connections: Socially Isolated (06/04/2021)   Social Connection and Isolation Panel [NHANES]    Frequency of Communication with Friends and Family: More than three times a week    Frequency of Social Gatherings with Friends and Family: More than three times a week    Attends Religious Services: Never    Marine scientist or Organizations: No    Attends Archivist Meetings: Never    Marital Status: Widowed  Intimate Partner Violence: Not At Risk (06/04/2021)   Humiliation, Afraid, Rape, and Kick questionnaire    Fear of Current or Ex-Partner: No    Emotionally Abused: No    Physically Abused: No    Sexually Abused: No     Outpatient Medications Prior to Visit  Medication Sig Dispense Refill   acetaminophen (TYLENOL) 325 MG tablet Take 325 mg by mouth every 6 (six) hours as needed for moderate pain.      albuterol (VENTOLIN HFA) 108 (90 Base) MCG/ACT inhaler Inhale 2 puffs into the lungs every 6 (six) hours as needed for wheezing. 8.5 g 2   amLODipine (NORVASC) 10 MG tablet Take 10 mg by mouth daily.     Ascorbic Acid (VITAMIN C) 100 MG tablet Take 100 mg by mouth daily.     cetirizine (ZYRTEC) 10 MG tablet Take 10 mg by mouth at bedtime. As needed     Cholecalciferol (VITAMIN D3) 25 MCG (1000 UT) CAPS Take 1,000 Units by mouth daily.      donepezil (ARICEPT) 10 MG tablet TAKE 1 TABLET BY MOUTH DAILY 30 tablet 3   levothyroxine (SYNTHROID) 75 MCG tablet TAKE 1 TABLET(75 MCG) BY MOUTH DAILY 30 tablet 3   lidocaine-prilocaine (EMLA) cream Apply 1 application topically as needed. (Patient taking differently: Apply 1 application  topically as needed (port access).) 30 g 1   mirtazapine (REMERON) 7.5 MG tablet TAKE 1 TABLET(7.5 MG) BY MOUTH AT BEDTIME 30 tablet 5   pantoprazole (PROTONIX) 40 MG tablet TAKE 1 TABLET(40 MG) BY MOUTH DAILY 90 tablet 3   rOPINIRole (REQUIP) 0.25 MG tablet Take 1 tablet (0.25 mg total) by mouth at bedtime. 90 tablet 1   rosuvastatin (CRESTOR) 20 MG tablet Take 1 tablet (20 mg total) by mouth daily. 90 tablet 3   sucralfate (CARAFATE) 1 g tablet Take 1 g by mouth 4 (four) times daily -  with meals and at bedtime. As needed     tiZANidine (ZANAFLEX) 4 MG tablet Take 1 tablet (4 mg total) by mouth at bedtime as needed for muscle spasms. Do not take with alcohol or while driving or operating heavy machinery 30 tablet 0   vitamin B-12 (CYANOCOBALAMIN) 1000 MCG tablet Take 1,000 mcg by mouth daily.      No facility-administered medications prior to visit.    Allergies  Allergen Reactions   Codeine  Phosphate Nausea And Vomiting and Rash    ROS Review of Systems  Constitutional:   Positive for fatigue. Negative for chills and fever.  HENT:  Negative for congestion, sinus pressure, sinus pain and sore throat.   Eyes:  Positive for visual disturbance. Negative for pain and discharge.  Respiratory:  Negative for cough and shortness of breath.   Cardiovascular:  Negative for chest pain and palpitations.  Gastrointestinal:  Positive for diarrhea. Negative for abdominal pain, nausea and vomiting.  Endocrine: Negative for polydipsia and polyuria.  Genitourinary:  Positive for dysuria and urgency. Negative for hematuria.  Musculoskeletal:  Positive for arthralgias (Right shoulder). Negative for neck pain and neck stiffness.  Skin:  Negative for rash.  Neurological:  Positive for dizziness. Negative for weakness.  Psychiatric/Behavioral:  Positive for dysphoric mood and sleep disturbance. Negative for agitation and behavioral problems.       Objective:    Physical Exam Vitals reviewed.  Constitutional:      General: She is not in acute distress.    Appearance: She is not diaphoretic.  HENT:     Head: Normocephalic and atraumatic.     Nose: Nose normal.     Mouth/Throat:     Mouth: Mucous membranes are moist.  Eyes:     General: No scleral icterus.    Extraocular Movements: Extraocular movements intact.  Cardiovascular:     Rate and Rhythm: Normal rate and regular rhythm.     Pulses: Normal pulses.     Heart sounds: Normal heart sounds. No murmur heard. Pulmonary:     Breath sounds: Normal breath sounds. No wheezing or rales.  Abdominal:     General: Bowel sounds are normal.     Palpations: Abdomen is soft.     Tenderness: There is abdominal tenderness (Mild, epigastric and RUQ).  Musculoskeletal:     Cervical back: Neck supple. No tenderness.     Right lower leg: No edema.     Left lower leg: No edema.     Comments: ROM at right shoulder limited due to pain  Skin:    General: Skin is warm.     Findings: No rash.  Neurological:     General: No focal  deficit present.     Mental Status: She is alert and oriented to person, place, and time.     Sensory: No sensory deficit.     Motor: Weakness (B/l LE) present.  Psychiatric:        Mood and Affect: Mood normal.        Behavior: Behavior normal.     BP 134/69 (BP Location: Left Arm, Patient Position: Sitting, Cuff Size: Normal)   Pulse 76   Ht '5\' 2"'$  (1.575 m)   Wt 137 lb 12.8 oz (62.5 kg)   SpO2 98%   BMI 25.20 kg/m  Wt Readings from Last 3 Encounters:  10/09/22 137 lb 12.8 oz (62.5 kg)  08/14/22 133 lb (60.3 kg)  08/01/22 133 lb 14.4 oz (60.7 kg)    Lab Results  Component Value Date   TSH 0.736 07/09/2022   Lab Results  Component Value Date   WBC 8.6 08/01/2022   HGB 12.3 08/01/2022   HCT 35.8 (L) 08/01/2022   MCV 87.5 08/01/2022   PLT 232 08/01/2022   Lab Results  Component Value Date   NA 140 08/01/2022   K 4.0 08/01/2022   CO2 25 08/01/2022   GLUCOSE 119 (H) 08/01/2022   BUN 24 (H) 08/01/2022   CREATININE  1.27 (H) 08/01/2022   BILITOT 0.3 08/01/2022   ALKPHOS 75 08/01/2022   AST 18 08/01/2022   ALT 16 08/01/2022   PROT 7.1 08/01/2022   ALBUMIN 4.5 08/01/2022   CALCIUM 9.8 08/01/2022   ANIONGAP 7 08/01/2022   EGFR 38 (L) 07/09/2022   GFR 33.87 (L) 12/29/2019   Lab Results  Component Value Date   CHOL 303 (H) 05/05/2022   Lab Results  Component Value Date   HDL 60 05/05/2022   Lab Results  Component Value Date   LDLCALC 182 (H) 05/05/2022   Lab Results  Component Value Date   TRIG 315 (H) 05/05/2022   Lab Results  Component Value Date   CHOLHDL 5.1 (H) 05/05/2022   Lab Results  Component Value Date   HGBA1C 5.9 (H) 05/05/2022      Assessment & Plan:   Problem List Items Addressed This Visit       Cardiovascular and Mediastinum   Essential hypertension, benign - Primary    BP Readings from Last 1 Encounters:  10/09/22 134/69  Overall well-controlled with Amlodipine 10 mg QD Counseled for compliance with the  medications Advised DASH diet        Digestive   Gastroesophageal reflux disease with esophagitis    Needs to take Pantoprazole regularly Needs to take at least 2 meals in a day        Endocrine   Acquired hypothyroidism    Lab Results  Component Value Date   TSH 0.736 07/09/2022  On Levothyroxine 75 mcg QD, but needs to be compliant Will check TSH and free T4      Relevant Orders   TSH + free T4     Nervous and Auditory   Alzheimer's dementia ([place])    Followed by Neurology On Aricept now Needs to remain compliant to Remeron for MDD and insomnia        Musculoskeletal and Integument   Osteopenia   Relevant Orders   DG Bone Density     Genitourinary   Stage 3a chronic kidney disease ([place])    Check CMP - needs to improve hydration Avoid nephrotoxic agents      Relevant Orders   Basic Metabolic Panel (BMET)   Acute cystitis without hematuria    Has dysuria and urinary urgency Check UA with reflex culture Will start abx depending upon UA      Relevant Orders   UA/M w/rflx Culture, Routine     Other   Major depression, recurrent, chronic ([place])    [place] Office Visit from 04/16/2022 in [facility]  PHQ-9 Total Score 0     Also reports insomnia and decreased appetite, had started Remeron 7.5 mg QD - needs to be compliant      Marginal zone lymphoma ([place])    Follows up with Oncology      Restless legs syndrome    Continue Ropinirole, refilled      Relevant Orders   Basic Metabolic Panel (BMET)   No orders of the defined types were placed in this encounter.   Follow-up: Return in about 4 months (around 02/07/2023).    Lindell Spar, MD

## 2022-10-09 NOTE — Assessment & Plan Note (Signed)
Has dysuria and urinary urgency Check UA with reflex culture Will start abx depending upon UA

## 2022-10-09 NOTE — Assessment & Plan Note (Signed)
Continue Ropinirole, refilled

## 2022-10-09 NOTE — Assessment & Plan Note (Signed)
BP Readings from Last 1 Encounters:  10/09/22 134/69   Overall well-controlled with Amlodipine 10 mg QD Counseled for compliance with the medications Advised DASH diet

## 2022-10-09 NOTE — Patient Instructions (Signed)
Please start taking Pantoprazole for acid reflux/heartburn.  Please start taking Mirtazepine as prescribed.  Please eat at regular intervals and maintain around 64 ounces of fluid intake.

## 2022-10-09 NOTE — Assessment & Plan Note (Signed)
Follows up with Oncology

## 2022-10-09 NOTE — Assessment & Plan Note (Signed)
Lab Results  Component Value Date   TSH 0.736 07/09/2022   On Levothyroxine 75 mcg QD, but needs to be compliant Will check TSH and free T4

## 2022-10-09 NOTE — Assessment & Plan Note (Addendum)
Followed by Neurology On Aricept now Needs to remain compliant to Remeron for MDD and insomnia

## 2022-10-09 NOTE — Assessment & Plan Note (Signed)
Check CMP - needs to improve hydration Avoid nephrotoxic agents

## 2022-10-09 NOTE — Assessment & Plan Note (Signed)
[place] Office Visit from 04/16/2022 in [facility]  PHQ-9 Total Score 0      Also reports insomnia and decreased appetite, had started Remeron 7.5 mg QD - needs to be compliant

## 2022-10-10 LAB — UA/M W/RFLX CULTURE, ROUTINE
Bilirubin, UA: NEGATIVE
Glucose, UA: NEGATIVE
Ketones, UA: NEGATIVE
Leukocytes,UA: NEGATIVE
Nitrite, UA: NEGATIVE
Protein,UA: NEGATIVE
RBC, UA: NEGATIVE
Specific Gravity, UA: 1.021 (ref 1.005–1.030)
Urobilinogen, Ur: 0.2 mg/dL (ref 0.2–1.0)
pH, UA: 6 (ref 5.0–7.5)

## 2022-10-10 LAB — BASIC METABOLIC PANEL
BUN/Creatinine Ratio: 17 (ref 12–28)
BUN: 23 mg/dL (ref 8–27)
CO2: 23 mmol/L (ref 20–29)
Calcium: 10 mg/dL (ref 8.7–10.3)
Chloride: 101 mmol/L (ref 96–106)
Creatinine, Ser: 1.32 mg/dL — ABNORMAL HIGH (ref 0.57–1.00)
Glucose: 123 mg/dL — ABNORMAL HIGH (ref 70–99)
Potassium: 4.9 mmol/L (ref 3.5–5.2)
Sodium: 143 mmol/L (ref 134–144)
eGFR: 41 mL/min/{1.73_m2} — ABNORMAL LOW (ref >59)

## 2022-10-10 LAB — MICROSCOPIC EXAMINATION
Bacteria, UA: NONE SEEN
Casts: NONE SEEN /lpf
WBC, UA: NONE SEEN /hpf (ref 0–5)

## 2022-10-10 LAB — TSH+FREE T4
Free T4: 1.04 ng/dL (ref 0.82–1.77)
TSH: 4.31 u[IU]/mL (ref 0.450–4.500)

## 2022-10-27 ENCOUNTER — Other Ambulatory Visit: Payer: Self-pay | Admitting: Internal Medicine

## 2022-10-27 DIAGNOSIS — G2581 Restless legs syndrome: Secondary | ICD-10-CM

## 2022-11-12 ENCOUNTER — Ambulatory Visit: Payer: Medicare HMO | Admitting: Psychology

## 2022-11-12 ENCOUNTER — Encounter: Payer: Self-pay | Admitting: Psychology

## 2022-11-12 DIAGNOSIS — F03A0 Unspecified dementia, mild, without behavioral disturbance, psychotic disturbance, mood disturbance, and anxiety: Secondary | ICD-10-CM | POA: Insufficient documentation

## 2022-11-12 DIAGNOSIS — R4189 Other symptoms and signs involving cognitive functions and awareness: Secondary | ICD-10-CM

## 2022-11-12 DIAGNOSIS — I6381 Other cerebral infarction due to occlusion or stenosis of small artery: Secondary | ICD-10-CM | POA: Insufficient documentation

## 2022-11-12 HISTORY — DX: Unspecified dementia, mild, without behavioral disturbance, psychotic disturbance, mood disturbance, and anxiety: F03.A0

## 2022-11-12 NOTE — Progress Notes (Unsigned)
NEUROPSYCHOLOGICAL EVALUATION Elizabeth Mcintyre. Elizabeth [facility] Department of Neurology  Date of Evaluation: November 12, 2022  Reason for Referral:   Elizabeth Mcintyre is a 82 y.o. right-handed Caucasian female referred by Elizabeth Butters, PA-C, to characterize her current cognitive functioning and assist with diagnostic clarity and treatment planning in the context of subjective cognitive decline and previous concerns surrounding underlying Alzheimer's disease.   Assessment and Plan:   Clinical Impression(s): Elizabeth Mcintyre's pattern of performance is suggestive of primary impairments surrounding cognitive flexibility and the majority of both receptive and expressive language abilities. She also exhibited impairments surrounding delayed retrieval aspects of memory with concerns for rapid forgetting. Performances were appropriate relative to age-matched peers across processing speed, attention/concentration, visuospatial abilities, encoding (i.e., learning) aspects of memory, and recognition/consolidation aspects of memory. Relative to her previous evaluation in July 2022, the majority of performances were stable despite the utilization of different instrumentation. Story-based memory actually saw some improvement over time, whereas receptive language represented her greatest area of decline.   Functionally, Elizabeth Mcintyre's family has taken over medication management. She has limited driving behaviors and her daughter described greater difficulties with navigation and instances where she has gotten lost. Elizabeth Mcintyre, Elizabeth Mcintyre denied all concerns. Her daughter noted that Elizabeth Mcintyre appears unable to appreciate the value of finances and has been spending or losing large amounts of money. This has created fears that Elizabeth Mcintyre and her family will not have sufficient funds to care for her in the future. Of note, at the conclusion of the current evaluation, Elizabeth Mcintyre was observed attempting to  hand a staff member a $50 bill. When this staff member refused to accept this gift, Elizabeth Mcintyre reportedly stated "well nobody has to know" and indicated that she has "a lot of money." This interaction mirrored her daughter's previous report of Elizabeth Mcintyre mismanaging finances very closely. I would also be fearful of her vulnerability to fall for a future financial scam. 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  The etiology for her dementia presentation remains unclear. There is the potential for atypical brain lateralization given her being right-handed, the presence of right temporal anatomical changes, and noted language dysfunction. In right-handed individuals, being right hemisphere dominant for language is believed to represent only about 10% of the general population. Specific to anatomical changes, her most recent neuroimaging suggested volume loss involving the temporal horn of the right lateral ventricle and right hippocampus of moderate severity. As Dr. 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 Admittedly, testing patterns do favor dysfunction involving the frontal (i.e., cognitive flexibility) and temporal (i.e., receptive/expressive language) lobes. This is especially true of some evidence for diminished comprehension and semantic knowledge. Case studies have suggested that potential rtvFTD cases can devolve into semantic dementia presentations, which would align with comprehension and semantic knowledge impairments. However, it is necessary to highlight that rtvFTD remains a fairly ambiguous term that does not have firmly defined clinical or cognitive characteristics. While this could remain plausible, this as a formal diagnosis would appear premature.    Underlying Alzheimer's disease cannot be ruled out. Despite learning information reasonably well, she was fully amnestic (i.e., 0% retention) across list and figure tasks. However, retention across story-based information was appropriate and she generally performed adequately across yes/no recognition tasks. While there are  some concerns for rapid forgetting, she does not display a prominent storage impairment and her overall patterns do not fully align with a traditionally presenting Alzheimer's disease presentation currently. However, an atypical presentation (given neuroimaging) or this disease process simply being in fairly early stages also remain plausible explanations. An illness along the primary progressive aphasia (PPA) spectrum, namely the semantic or logopenic subtypes, also cannot be ruled out given prominent language dysfunction. Her age would be advanced for when this type of illness normally presents; however, this also remains plausible.  Overall, while I have strong suspicions of an evolving neurodegenerative illness, I cannot provide further diagnostic clarity with regard to the conditions described above. Neuroimaging did not suggest advanced microvascular ischemic disease and remote lacunar infarctions in the cerebellum would not account for exhibited impairment in isolation, making a vascular cause unlikely. She also does not display behavioral or cognitive characteristics of Lewy body disease or another more rare parkinsonian condition. Continued medical monitoring will be important moving forward.   Recommendations: Dr. Nicole Mcintyre previously recommended a lumbar puncture to help aid with diagnostic clarity. This does not appear to have been performed. I agree with this recommendation if Elizabeth Mcintyre and her family wish to obtain greater diagnostic clarity. In all likelihood, this will not dramatically alter clinical treatment and medication intervention. It could allow for more nuanced  future planning and expectations of disease progression, so the pros and cons of this procedure should be discussed with Elizabeth Mcintyre.   If Elizabeth Mcintyre and her family do not wish to have a lumbar puncture performed, they could also discuss A/T/N blood plasma biomarker labs as a less invasive procedure which can help provide further diagnostic clarity surrounding underlying pathological changes.   A repeat neuropsychological evaluation in 12-24 months (or sooner if functional decline is noted) is recommended to assess the trajectory of future cognitive decline should it occur. This will also aid in future efforts towards improved diagnostic clarity.  Ms. Salaiz has already been prescribed a medication aimed to address memory loss and concerns surrounding progressive cognitive decline (i.e., donepezil/Aricept). She is encouraged to continue taking this medication as prescribed. It is important to highlight that this medication has been shown to slow functional decline in some individuals. There is no current treatment which can stop or reverse cognitive decline when caused by a neurodegenerative illness.   Performance across neurocognitive testing is not a strong predictor of an individual's safety operating a motor vehicle. Should her family wish to pursue a formalized driving evaluation, they could reach out to the following agencies: [employer] in [place]: [phone] Driver Rehabilitative Services: [facility]: [place]: [phone] or [phone]  Should there be progression of current deficits over time, Ms. Pickles is unlikely to regain any independent living skills lost. Therefore, it is recommended that she remain as involved as possible in all aspects of household chores, finances, and medication management, with supervision to ensure adequate performance. she will likely benefit from the establishment and maintenance of a routine  in order to maximize her functional abilities over time.  It will be important for Ms. Lipuma to have another person with her when in situations where she may need to process information, weigh the pros and cons of different options, and make decisions, in order to ensure that she fully understands and recalls all information to be considered.  If not already done, Ms. Arensdorf and her family may want to discuss her wishes regarding durable power of  attorney and medical decision making, so that she can have input into these choices. If they require legal assistance with this, long-term care resource access, or other aspects of estate planning, they could reach out to [employer] at [phone] for a free consultation. This may be especially prudent given current financial concerns held by her daughter. Additionally, they may wish to discuss future plans for caretaking and seek out community options for in home/residential care should they become necessary.  Ms. Hower is encouraged to attend to lifestyle factors for brain health (e.g., regular physical exercise, good nutrition habits and consideration of the MIND-DASH diet, regular participation in cognitively-stimulating activities, and general stress management techniques), which are likely to have benefits for both emotional adjustment and cognition. Optimal control of vascular risk factors (including safe cardiovascular exercise and adherence to dietary recommendations) is encouraged. Continued participation in activities which provide mental stimulation and social interaction is also recommended.   Important information should be provided to Ms. Metts in written format in all instances. This information should be placed in a highly frequented and easily visible location within her home to promote recall. External strategies such as written notes in a consistently used memory journal, visual and nonverbal auditory cues such as a calendar on the  refrigerator or appointments with alarm, such as on a cell phone, can also help maximize recall.  Because she shows better recall for structured information, she will likely understand and retain new information better if it is presented to her in a meaningful or well-organized manner at the outset, such as grouping items into meaningful categories or presenting information in an outlined, bulleted, or story format.  To address problems with fluctuating attention and/or executive dysfunction, she may wish to consider:   -Avoiding external distractions when needing to concentrate   -Limiting exposure to fast paced environments with multiple sensory demands   -Writing down complicated information and using checklists   -Attempting and completing one task at a time (i.e., no multi-tasking)   -Verbalizing aloud each step of a task to maintain focus   -Taking frequent breaks during the completion of steps/tasks to avoid fatigue   -Reducing the amount of information considered at one time   -Scheduling more difficult activities for a time of day where she is usually most alert  Review of Records:   Ms. Putnam was seen by [facility] Marland KitchenEllouise Newer, M.D.) on 01/17/2021 for an evaluation of memory loss. At that time, memory dysfunction was said to be present for the past 2 or so years. Her daughter noted that Ms. Kondracki would get lost driving to her home, as well as her mother becoming very repetitive in conversation. She was also missing bills/taxes at that time and family had begun to become involved in medication management. Sleep dysfunction was noted, as well as some headache experiences. Performance on a brief cognitive screening instrument (MOCA) was 14/30. Ultimately, she was diagnosed with a mild dementia presentation and referred for neuropsychological testing.   She completed a comprehensive neuropsychological evaluation Alphonzo Severance, Psy.D.) on 03/27/2021. Results suggested severe  impairment surrounding confrontation naming, diminished semantic fluency, and ongoing memory storage dysfunction. Clinical symptoms and testing patterns were said to be most consistent with Alzheimer's disease. Some radiological signs also could also suggest right temporal variant frontotemporal dementia (FTD) given neuroimaging suggested asymmetric atrophy involving the anterior right temporal lobe. A repeat evaluation in 12-24 months was recommended.   Ms. Antin was most recently seen by [facility] Elizabeth Butters,  PA-C) on 08/14/2022 for continued follow-up. She was started on donepezil in the interim. She described cognition as stable over time but did describe more frequent deja vu experiences. Family has become more involved instrumental ADLs over time. Performance on a brief cognitive screening instrument (MMSE) on 02/11/2022 was 24/30. Ultimately, Ms. Kwasnik was referred for a repeat neuropsychological evaluation to characterize her cognitive abilities and to assist with diagnostic clarity and treatment planning.   Brain MRI on 02/08/2020 revealed mild parenchymal volume loss, as well as small remote infarcts in the bilateral cerebellar hemispheres. Brain MRI on 02/12/2021 was stable in this regard. However, it did reveal moderate prominence volume loss involving the temporal horn of the right lateral ventricle, suggesting asymmetric volume loss of the right hippocampus.   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    Stress incontinence    Thrombocytopenia 01/04/2020   Type II diabetes mellitus 11/09/2007   diet control   Vitamin D deficiency 05/25/2015    Past Surgical History:  Procedure Laterality Date   ABDOMINAL HYSTERECTOMY     BALLOON DILATION N/A 08/21/2020   Procedure: BALLOON DILATION;  Surgeon: Eloise Harman, DO;  [facility];  Service: Endoscopy;  Laterality: N/A;   BIOPSY  03/30/2020   Procedure: BIOPSY;  Surgeon: Ronald Lobo, MD;  Location: [facility];  Service: Endoscopy;;   BIOPSY  05/02/2020   Procedure: BIOPSY;  Surgeon: Harvel Quale, MD;  Location: [facility];  Service: Gastroenterology;;   CARDIAC CATHETERIZATION     X 2, last one in [place] 04/13/2017   Procedure: LAPAROSCOPIC CHOLECYSTECTOMY;  Surgeon: Aviva Signs, MD;  Location: [facility];  Service: General;  Laterality: N/A;    COLONOSCOPY     COLONOSCOPY  May 2012   Dr. Olevia Perches: mild diverticulosis, otherwise normal.  COLONOSCOPY WITH PROPOFOL N/A 05/02/2020   Dr. Jenetta Downer: 8 mm polyp removed from the ascending colon, 2 mm polyp removed from the ascending colon.  Tubular adenomas.  Diverticulosis.  Mucosal ulceration in the sigmoid colon noted, pathology consistent with ischemic colitis.   ESOPHAGOGASTRODUODENOSCOPY N/A 01/28/2015   Dr. Gala Romney: reflux esophagitis, Schatzki's ring not manipulated due to recent bleeding   ESOPHAGOGASTRODUODENOSCOPY N/A 03/30/2015   Dr. Gala Romney: Schatzki's ring s/p Venia Minks dilation, previously noted esophageal ulcer completely healed   ESOPHAGOGASTRODUODENOSCOPY N/A 03/30/2020   Buccini: Moderately severe erosive, circumferential, confluent esophagitis with no bleeding found 25 to 40 cm from incisors.  Nonobstructing and mild Schatzki ring, there were also multiple distal esophageal rings noted, minimal hiatal hernia.   ESOPHAGOGASTRODUODENOSCOPY (EGD) WITH PROPOFOL N/A 08/21/2020   Procedure: ESOPHAGOGASTRODUODENOSCOPY (EGD) WITH PROPOFOL;  Surgeon: Eloise Harman, DO;  [facility];  Service: Endoscopy;  Laterality: N/A;  2:15pm   IR IMAGING GUIDED PORT INSERTION  03/13/2020   IR REMOVAL TUN ACCESS W/ PORT W/O FL MOD SED  10/01/2021   LYMPH NODE BIOPSY Left 03/20/2020   Procedure: LEFT POSTERIOR CERVICAL LYMPH NODE BIOPSY;  Surgeon: Georganna Skeans, MD;  Location: [facility];  Service: General;  Laterality: Left;   MALONEY DILATION N/A 03/30/2015   Procedure: Keturah Shavers;  Surgeon: Daneil Dolin, MD;  [facility];  Service: Endoscopy;  Laterality: N/A;   POLYPECTOMY  05/02/2020   Procedure: POLYPECTOMY;  Surgeon: Montez Morita, Quillian Quince, MD;  [facility];  Service: Gastroenterology;;    Current Outpatient Medications:    acetaminophen (TYLENOL) 325 MG tablet, Take 325 mg by mouth every 6 (six) hours as needed for moderate pain. , Disp: , Rfl:     albuterol (VENTOLIN HFA) 108 (90 Base) MCG/ACT inhaler, Inhale 2 puffs into the lungs every 6 (six) hours as needed for wheezing., Disp: 8.5 g, Rfl: 2   amLODipine (NORVASC) 10 MG tablet, Take 10 mg by mouth daily., Disp: , Rfl:    Ascorbic Acid (VITAMIN C) 100 MG tablet, Take 100 mg by mouth daily., Disp: , Rfl:    cetirizine (ZYRTEC) 10 MG tablet, Take 10 mg by mouth at bedtime. As needed, Disp: , Rfl:    Cholecalciferol (VITAMIN D3) 25 MCG (1000 UT) CAPS, Take 1,000 Units by mouth daily. , Disp: , Rfl:    donepezil (ARICEPT) 10 MG tablet, TAKE 1 TABLET BY MOUTH DAILY, Disp: 30 tablet, Rfl: 3   levothyroxine (SYNTHROID) 75 MCG tablet, TAKE 1 TABLET(75 MCG) BY MOUTH DAILY, Disp: 30 tablet, Rfl: 3   lidocaine-prilocaine (EMLA) cream, Apply 1 application topically as needed. (Patient taking differently: Apply 1 application  topically as needed (port access).), Disp: 30 g, Rfl: 1   mirtazapine (REMERON) 7.5 MG tablet, TAKE 1 TABLET(7.5 MG) BY MOUTH AT BEDTIME, Disp: 30 tablet, Rfl: 5   pantoprazole (PROTONIX) 40 MG tablet, TAKE 1 TABLET(40 MG) BY MOUTH DAILY, Disp: 90 tablet, Rfl: 3   rOPINIRole (REQUIP) 0.25 MG tablet, TAKE 1 TABLET(0.25 MG) BY MOUTH AT BEDTIME, Disp: 90 tablet, Rfl: 1   rosuvastatin (CRESTOR) 20 MG tablet, Take 1 tablet (20 mg total) by mouth daily., Disp: 90 tablet, Rfl: 3   sucralfate (CARAFATE) 1 g tablet, Take 1 g by mouth 4 (four) times daily -  with meals and at bedtime. As needed, Disp: , Rfl:    tiZANidine (ZANAFLEX) 4 MG tablet, Take 1 tablet (4 mg total) by mouth at bedtime as needed for muscle spasms. Do not  take with alcohol or while driving or operating heavy machinery, Disp: 30 tablet, Rfl: 0   vitamin B-12 (CYANOCOBALAMIN) 1000 MCG tablet, Take 1,000 mcg by mouth daily. , Disp: , Rfl:   Clinical Interview:   The following information was obtained during a clinical interview with Ms. Kuhner and her daughter prior to cognitive testing.  Cognitive  Symptoms: Decreased short-term memory: Endorsed. Ms. Cryan primarily provided examples surrounding not recalling when she went to a particular location, as well as more frequently misplacing or losing things. Her daughter described greater generalized concerns, as well as Ms. Stander being increasingly repetitive during conversation and being unable to recall who said what to her. She also noted that Ms. Barczak can become argumentative at times surrounding memory lapses. Difficulties were said to be present since 2018-2019 and there does seem to be some observation of progressive decline per her daughter.  Decreased long-term memory: Denied. Decreased attention/concentration: Denied. Reduced processing speed: Denied. Difficulties with executive functions: Endorsed. Difficulties were generally isolated to organizational abilities. They did not outright describe trouble with multi-tasking or decision making in terms of indecisiveness. They also denied trouble with impulsivity or any significant personality changes.  Difficulties with emotion regulation: Denied. Difficulties with receptive language: Denied. Difficulties with word finding: Denied. Decreased visuoperceptual ability: Denied.  Difficulties completing ADLs: Endorsed. Ms. Diane denied ADL dysfunction. However, her daughter highlighted that she and other family members are fully responsible for medication management. Her daughter also highlighted that Ms. Huyck appears unable to appreciate the value of finances and has been spending or losing large amounts of money. Her daughter noted a recent home sale and that this money has rapidly dwindled, creating fears that Ms. Stradley and her family will not have sufficient funds to care for her in the future. It appears that Ms. Lennox continues to drive locally. However, her daughter described instances where her mother has gotten lost and otherwise exhibited diminished navigational abilities.    Additional Medical History: History of traumatic brain injury/concussion: Denied. History of stroke: Denied. History of seizure activity: Denied. History of known exposure to toxins: Denied. Symptoms of chronic pain: Endorsed. She reported ongoing knee pain and more generalized "bone pain." Experience of frequent headaches/migraines: Endorsed. She reported frequent experiences of a generally dull ache involving her left hemisphere.  Frequent instances of dizziness/vertigo: Denied.  Sensory changes: She wears glasses with benefit. Other sensory changes/difficulties (e.g., hearing, taste, smell) were denied.  Balance/coordination difficulties: Somewhat. She reported several falls in the somewhat recent past, with the most recent being 2-3 weeks prior. The latter instance, she described feeling weak and falling in a sudden manner without much warning. Regarding ambulation, she denied consistent difficulties or one side feeling weaker or less stable relative to the other.  Other motor difficulties: Denied.  Other medical conditions: She was diagnosed with lymphoma in 2021 and underwent treatment including surgery and two rounds of chemotherapy. She was also placed on immunotherapy medications afterwards.   Sleep History: Estimated hours obtained each night: Unclear. Ms. Bown generally described her sleep as poor overall. She noted some nights where she does not need to sleep at all, as well as others where she is able to sleep without significant difficulty.  Difficulties falling asleep: Endorsed. Difficulties staying asleep: Endorsed. Feels rested and refreshed upon awakening: Variably so depending on the quantity and quality of sleep previously obtained.   History of snoring: Denied. History of waking up gasping for air: Denied. Witnessed breath cessation while asleep: Denied.  History  of vivid dreaming: Endorsed. Excessive movement while asleep: Denied. Instances of acting out her  dreams: Denied.  Psychiatric/Behavioral Health History: Depression: She described her current mood as "good." She generally denied to her knowledge any formal mental health concerns or diagnoses over the years. However, after stating this, she did suggest that she had briefly been placed in an anti-depressant medication in the past around 2015-2016. Current or remote suicidal ideation, intent, or plan was denied.  Anxiety: Denied. Mania: Denied. Trauma History: Denied. Visual/auditory hallucinations: Denied. Delusional thoughts: Denied.  Tobacco: Denied. Alcohol: She denied current alcohol consumption as well as a history of problematic alcohol abuse or dependence.  Recreational drugs: Denied.  Family History: Problem Relation Age of Onset   Heart disease Mother    Osteoarthritis Mother    Sudden death Father    Single kidney Father    Other Father        h/o severe MVA injuries   Hyperlipidemia Sister    Other Daughter        Myalgias   Fibromyalgia Daughter    Allergies Daughter    Heart disease Maternal Grandfather    Sudden death Paternal Grandmother    Diabetes Paternal Grandfather    Heart disease Daughter    Other Daughter        palpitations   Pulmonary fibrosis Maternal Aunt    Cancer Paternal Uncle    Pulmonary fibrosis Maternal Aunt    Colon cancer Neg Hx    This information was confirmed by Ms. Beazley.  Academic/Vocational History: Highest level of educational attainment: 12 years. She graduated from high school and described herself as a good (A/B) student in academic settings. No relative weaknesses were identified.  History of developmental delay: Denied. History of grade repetition: Denied. Enrollment in special education courses: Denied. History of LD/ADHD: Denied.  Employment: Retired. She previously worked as an Glass blower/designer for many years.   Evaluation Results:   Behavioral Observations: Ms. Macfadyen was accompanied by her daughter, arrived to  her appointment on time, and was appropriately dressed and groomed. She appeared alert. Observed gait and station were within normal limits. Gross motor functioning appeared intact upon informal observation and no abnormal movements (e.g., tremors) were noted. Her affect was generally relaxed and positive, but did range appropriately given the subject being discussed during the clinical interview. Spontaneous speech was fluent and word finding difficulties were not observed during the clinical interview. Thought processes were coherent, organized, and normal in content. Insight into her cognitive difficulties appeared somewhat limited and Ms. Lundquist likely does not fully appreciate the extent of ongoing cognitive impairment. She also does not appear to appreciate the extent that she is receiving functional assistance from family members in her day-to-day life.   During testing, sustained attention was appropriate. Task engagement was adequate and she persisted when challenged. She did fatigue as the evaluation progressed. It was mildly abbreviated in response. Overall, Ms. Schul was cooperative with the clinical interview and subsequent testing procedures.   Adequacy of Effort: The validity of neuropsychological testing is limited by the extent to which the individual being tested may be assumed to have exerted adequate effort during testing. Ms. Wolfrey expressed her intention to perform to the best of her abilities and exhibited adequate task engagement and persistence. Scores across stand-alone and embedded performance validity measures were within expectation. As such, the results of the current evaluation are believed to be a valid representation of Ms. Spano's current cognitive functioning.  Test Results: Ms.  Drucker was largely oriented at the time of the current evaluation. Points were lost for her being unable to name the current clinic.  Intellectual abilities based upon educational and  vocational attainment were estimated to be in the average range. Premorbid abilities were estimated to be within the well below average range based upon a single-word reading test.   Processing speed was average to above average. Basic attention was average to above average. More complex attention (e.g., working memory) was also average to above average. Cognitive flexibility was exceptionally low. Additional aspects of executive functioning were unable to be assessed due to increasing fatigue.  Assessed receptive language abilities were well below average. Difficulties were exhibited across several aspects of this task including sequencing commands, following multi-step commands, and understanding more complex sentence structure. She performed very poorly across a basic task assessing semantic knowledge. Assessed expressive language was somewhat variable but largely below expectation. Sentence repetition was well below average, phonemic fluency was above average, semantic fluency was below average to average, and confrontation naming was exceptionally low.    Assessed visuospatial/visuoconstructional abilities were mildly variable but overall appropriate, ranging from the below average to above average normative ranges.    Learning (i.e., encoding) of novel verbal information was below average to average. Spontaneous delayed recall (i.e., retrieval) of previously learned information was average across a story-based task but exceptionally low across list and figure tasks. Retention rates were 88% across a story learning task, 0% across a list learning task, and 0% across a figure drawing task. Performance across recognition tasks was generally adequate, suggesting evidence for information consolidation.   Results of emotional screening instruments suggested that recent symptoms of generalized anxiety were in the minimal range, while symptoms of depression were within normal limits. A screening instrument  assessing recent sleep quality suggested the presence of minimal sleep dysfunction.  Tables of Scores:   Note: This summary of test scores accompanies the interpretive report and should not be considered in isolation without reference to the appropriate sections in the text. Descriptors are based on appropriate normative data and may be adjusted based on clinical judgment. Terms such as "Within Normal Limits" and "Outside Normal Limits" are used when a more specific description of the test score cannot be determined.       Percentile - Normative Descriptor > 98 - Exceptionally High 91-97 - Well Above Average 75-90 - Above Average 25-74 - Average 9-24 - Below Average 2-8 - Well Below Average < 2 - Exceptionally Low       Orientation:      Raw Score Percentile   NAB Orientation, Form 1 27/29 --- ---       Cognitive Screening:      Raw Score Percentile   SLUMS: 14/30 --- ---       RBANS, Form A: Standard Score/ Scaled Score Percentile   Total Score 84 14 Below Average  Immediate Memory 85 16 Below Average    List Learning 6 9 Below Average    Story Memory 9 37 Average  Visuospatial/Constructional 96 39 Average    Figure Copy 12 75 Above Average    Line Orientation 13/20 10-16 Below Average  Language 87 19 Below Average    Picture Naming 7/10 <2 Exceptionally Low    Semantic Fluency 10 50 Average  Attention 112 79 Above Average    Digit Span 12 75 Above Average    Coding 12 75 Above Average  Delayed Memory 64 1 Exceptionally Low  List Recall 0/10 <2 Exceptionally Low    List Recognition 17/20 10-16 Below Average    Story Recall 9 37 Average    Story Recognition 9/12 29-52 Average    Figure Recall 1 <1 Exceptionally Low    Figure Recognition 3/8 6-20 Below Average        Intellectual Functioning:      Standard Score Percentile   Test of Premorbid Functioning: 78 7 Well Below Average       Attention/Executive Function:     Trail Making Test (TMT): Raw Score (Scaled  Score) Percentile     Part A 53 secs.,  1 error (8) 25 Average    Part B Discontinued --- Impaired  *Based on [employer] Older Normative Studies (MOANS)           Scaled Score Percentile   WAIS-IV Digit Span: 11 63 Average    Forward 11 63 Average    Backward 10 50 Average    Sequencing 12 75 Above Average       Language:      Raw Score Percentile   PPVT Screening Instrument: 11/16 --- Outside Normal Limits  Sentence Repetition: 12/22 3 Well Below Average       Verbal Fluency Test: Raw Score (Scaled Score) Percentile     Phonemic Fluency (CFL) 47 (13) 84 Above Average    Category Fluency 27 (7) 16 Below Average  *Based on [employer] Older Normative Studies (MOANS)          NAB Language Module, Form 1: T Score Percentile     Auditory Comprehension 36 8 Well Below Average    Reading Comprehension Raw = 13/13 --- Within Normal Limits    Naming 14/31 (20) <1 Exceptionally Low       Visuospatial/Visuoconstruction:      Raw Score Percentile   Clock Drawing: 8/10 --- Within Normal Limits        Scaled Score Percentile   WAIS-IV Block Design: 11 63 Average       Mood and Personality:      Raw Score Percentile   Geriatric Depression Scale: 5 --- Within Normal Limits  Geriatric Anxiety Scale: 3 --- Minimal    Somatic 2 --- Minimal    Cognitive 1 --- Minimal    Affective 0 --- Minimal       Additional Questionnaires:      Raw Score Percentile   PROMIS Sleep Disturbance Questionnaire: 8 --- None to Slight   Informed Consent and Coding/Compliance:   The current evaluation represents a clinical evaluation for the purposes previously outlined by the referral source and is in no way reflective of a forensic evaluation.   Ms. Delira was provided with a verbal description of the nature and purpose of the present neuropsychological evaluation. Also reviewed were the foreseeable risks and/or discomforts and benefits of the procedure, limits of confidentiality, and mandatory reporting  requirements of this provider. The patient was given the opportunity to ask questions and receive answers about the evaluation. Oral consent to participate was provided by the patient.   This evaluation was conducted by Christia Reading, Ph.D., ABPP-CN, board certified clinical neuropsychologist. Ms. Erling completed a clinical interview with Dr. Melvyn Novas, billed as one unit [phone], and 125 minutes of cognitive testing and scoring, billed as one unit [phone] and three additional units 96139. Psychometrist Milana Kidney, B.S., assisted Dr. Melvyn Novas with test administration and scoring procedures. As a separate and discrete service, Dr. Melvyn Novas spent a total of 160 minutes in  interpretation and report writing billed as one unit [phone] and two units [id number].

## 2022-11-12 NOTE — Progress Notes (Signed)
   Psychometrician Note   Cognitive testing was administered to Elizabeth Mcintyre by Elizabeth Mcintyre, B.S. (psychometrist) under the supervision of Dr. Christia Reading, Ph.D., licensed psychologist on 11/12/2022. Elizabeth Mcintyre did not appear overtly distressed by the testing session per behavioral observation or responses across self-report questionnaires. Rest breaks were offered.    The battery of tests administered was selected by Dr. Christia Reading, Ph.D. with consideration to Elizabeth Mcintyre's current level of functioning, the nature of her symptoms, emotional and behavioral responses during interview, level of literacy, observed level of motivation/effort, and the nature of the referral question. This battery was communicated to the psychometrist. Communication between Dr. Christia Reading, Ph.D. and the psychometrist was ongoing throughout the evaluation and Dr. Christia Reading, Ph.D. was immediately accessible at all times. Dr. Christia Reading, Ph.D. provided supervision to the psychometrist on the date of this service to the extent necessary to assure the quality of all services provided.    Elizabeth Mcintyre will return within approximately 1-2 weeks for an interactive feedback session with Dr. Melvyn Novas at which time her test performances, clinical impressions, and treatment recommendations will be reviewed in detail. Elizabeth Mcintyre understands she can contact our office should she require our assistance before this time.  A total of 125 minutes of billable time were spent face-to-face with Elizabeth Mcintyre by the psychometrist. This includes both test administration and scoring time. Billing for these services is reflected in the clinical report generated by Dr. Christia Reading, Ph.D.  This note reflects time spent with the psychometrician and does not include test scores or any clinical interpretations made by Dr. Melvyn Novas. The full report will follow in a separate note.

## 2022-11-13 ENCOUNTER — Encounter: Payer: Self-pay | Admitting: Radiology

## 2022-11-13 ENCOUNTER — Encounter: Payer: Self-pay | Admitting: Psychology

## 2022-11-19 ENCOUNTER — Ambulatory Visit: Payer: Medicare HMO | Admitting: Psychology

## 2022-11-19 DIAGNOSIS — F03A0 Unspecified dementia, mild, without behavioral disturbance, psychotic disturbance, mood disturbance, and anxiety: Secondary | ICD-10-CM | POA: Diagnosis not present

## 2022-11-19 NOTE — Progress Notes (Signed)
   Neuropsychology Feedback Session Elizabeth Mcintyre. [facility]  Reason for Referral:   Elizabeth Mcintyre is a 82 y.o. right-handed Caucasian female referred by Sharene Butters, PA-C, to characterize her current cognitive functioning and assist with diagnostic clarity and treatment planning in the context of subjective cognitive decline and previous concerns surrounding underlying Alzheimer's disease.   Feedback:   Ms. Gossman completed a comprehensive neuropsychological evaluation on 11/12/2022. Please refer to that encounter for the full report and recommendations. Briefly, results suggested primary impairments surrounding cognitive flexibility and the majority of both receptive and expressive language abilities. She also exhibited impairments surrounding delayed retrieval aspects of memory with concerns for rapid forgetting. Performances were appropriate relative to age-matched peers across processing speed, attention/concentration, visuospatial abilities, encoding (i.e., learning) aspects of memory, and recognition/consolidation aspects of memory. Relative to her previous evaluation in July 2022, the majority of performances were stable despite the utilization of different instrumentation. Story-based memory actually saw some improvement over time, whereas receptive language represented her greatest area of decline. Functionally, Ms. Strayer's family has taken over medication management. She has limited driving behaviors and her daughter described greater difficulties with navigation and instances where she has gotten lost. [name], Ms. Oboyle denied all concerns. Her daughter noted that Ms. Lamp appears unable to appreciate the value of finances and has been spending or losing large amounts of money. This has created fears that Ms. Holsworth and her family will not have sufficient funds to care for her in the future. Of note, at the conclusion of the current  evaluation, Ms. Salter was observed attempting to hand a staff member a $50 bill. When this staff member refused to accept this gift, Ms. Sun reportedly stated "well nobody has to know" and indicated that she has "a lot of money." This interaction mirrored her daughter's previous report of Ms. Batterton mismanaging finances very closely. I would also be fearful of her vulnerability to fall for a future financial scam. 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  Ms. Groshans was accompanied by her daughter during the current feedback session. Content of the current session focused on the results of her neuropsychological evaluation. Ms. Overmiller was given the opportunity to ask questions and her questions were answered. She was encouraged to reach out should additional questions arise. A copy of her report was provided at the conclusion of the visit.      Greater than 31 minutes were spent preparing for, conducting, and documenting the current feedback session with Ms. Hayter, billed as one unit [phone].

## 2022-12-02 ENCOUNTER — Encounter: Payer: Self-pay | Admitting: Internal Medicine

## 2022-12-02 ENCOUNTER — Ambulatory Visit (INDEPENDENT_AMBULATORY_CARE_PROVIDER_SITE_OTHER): Payer: Medicare HMO | Admitting: Internal Medicine

## 2022-12-02 VITALS — BP 132/70 | HR 94 | Ht 62.0 in | Wt 144.2 lb

## 2022-12-02 DIAGNOSIS — B029 Zoster without complications: Secondary | ICD-10-CM | POA: Insufficient documentation

## 2022-12-02 DIAGNOSIS — E1122 Type 2 diabetes mellitus with diabetic chronic kidney disease: Secondary | ICD-10-CM | POA: Diagnosis not present

## 2022-12-02 DIAGNOSIS — K2100 Gastro-esophageal reflux disease with esophagitis, without bleeding: Secondary | ICD-10-CM | POA: Diagnosis not present

## 2022-12-02 HISTORY — DX: Zoster without complications: B02.9

## 2022-12-02 MED ORDER — METHYLPREDNISOLONE 4 MG PO TBPK: ORAL_TABLET | ORAL | 0 refills | Status: DC

## 2022-12-02 MED ORDER — VALACYCLOVIR HCL 1 G PO TABS: 1000.0000 mg | ORAL_TABLET | Freq: Three times a day (TID) | ORAL | 0 refills | Status: AC

## 2022-12-02 NOTE — Progress Notes (Signed)
Acute Office Visit  Subjective:    Patient ID: Elizabeth Mcintyre, female    DOB: 03/29/1941, 82 y.o.   MRN: [id number]  Chief Complaint  Patient presents with   Rash    Patient has rash on her right thigh, swollen and painful     HPI Patient is in today for complaint of rash on her right thigh, which started about 3-4 days ago.  She had swelling and burning in the thigh area initially.  Denies any fever or chills.  She currently complains of burning in the thigh area, for which she has tried Neosporin without much relief.  She has not had shingles vaccine yet.  She also complains of epigastric pain, which is chronic.  Denies any nausea or vomiting.  She takes pantoprazole 40 mg daily.  Denies any hematemesis, melena or hematochezia.  She has chronic diarrhea, for which she takes Imodium as needed.  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    Stress incontinence    Thrombocytopenia 01/04/2020   Type II diabetes mellitus 11/09/2007   diet control   Vitamin D deficiency 05/25/2015    Past Surgical History:  Procedure Laterality Date   ABDOMINAL HYSTERECTOMY     BALLOON DILATION N/A 08/21/2020   Procedure: BALLOON DILATION;  Surgeon: Eloise Harman, DO;  Location: [facility];  Service: Endoscopy;  Laterality: N/A;   BIOPSY  03/30/2020   Procedure: BIOPSY;  Surgeon: Ronald Lobo, MD;  Location: [facility];  Service: Endoscopy;;   BIOPSY  05/02/2020   Procedure: BIOPSY;  Surgeon: Montez Morita, Quillian Quince, MD;  Location: [facility];  Service: Gastroenterology;;   CARDIAC CATHETERIZATION     X 2, last one in [place] 04/13/2017   Procedure: LAPAROSCOPIC CHOLECYSTECTOMY;  Surgeon: Aviva Signs, MD;  Location: [facility];  Service: General;  Laterality: N/A;   COLONOSCOPY     COLONOSCOPY  May 2012   Dr. Olevia Perches: mild diverticulosis, otherwise normal.    COLONOSCOPY WITH PROPOFOL N/A 05/02/2020   Dr. Jenetta Downer: 8 mm polyp removed from the ascending colon, 2 mm polyp removed from the ascending colon.  Tubular adenomas.  Diverticulosis.  Mucosal ulceration in the sigmoid colon noted, pathology consistent with ischemic colitis.   ESOPHAGOGASTRODUODENOSCOPY N/A 01/28/2015   Dr. Gala Romney: reflux esophagitis, Schatzki's ring not manipulated due to recent bleeding   ESOPHAGOGASTRODUODENOSCOPY N/A 03/30/2015   Dr. Gala Romney: Schatzki's ring s/p  Venia Minks dilation, previously noted esophageal ulcer completely healed   ESOPHAGOGASTRODUODENOSCOPY N/A 03/30/2020   Buccini: Moderately severe erosive, circumferential, confluent esophagitis with no bleeding found 25 to 40 cm from incisors.  Nonobstructing and mild Schatzki ring, there were also multiple distal esophageal rings noted, minimal hiatal hernia.   ESOPHAGOGASTRODUODENOSCOPY (EGD) WITH PROPOFOL N/A 08/21/2020   Procedure: ESOPHAGOGASTRODUODENOSCOPY (EGD) WITH PROPOFOL;  Surgeon: Eloise Harman, DO;  Location: [facility];  Service: Endoscopy;  Laterality: N/A;  2:15pm   IR IMAGING GUIDED PORT INSERTION  03/13/2020   IR REMOVAL TUN ACCESS W/ PORT W/O FL MOD SED  10/01/2021   LYMPH NODE BIOPSY Left 03/20/2020   Procedure: LEFT POSTERIOR CERVICAL LYMPH NODE BIOPSY;  Surgeon: Georganna Skeans, MD;  Location: [place];  Service: General;  Laterality: Left;   MALONEY DILATION N/A 03/30/2015   Procedure: Keturah Shavers;  Surgeon: Daneil Dolin, MD;  [facility]: [facility];  Service: Endoscopy;  Laterality: N/A;   POLYPECTOMY  05/02/2020   Procedure: POLYPECTOMY;  Surgeon: Montez Morita, Quillian Quince, MD;  Location: [facility];  Service: Gastroenterology;;    Family History  Problem Relation Age of Onset   Heart disease Mother    Osteoarthritis Mother    Sudden death Father    Single kidney Father    Other Father        h/o severe MVA injuries   Hyperlipidemia Sister    Other Daughter        Myalgias   Fibromyalgia Daughter    Allergies Daughter    Heart disease Maternal Grandfather    Sudden death Paternal Grandmother    Diabetes Paternal Grandfather    Heart disease Daughter    Other Daughter        palpitations   Pulmonary fibrosis Maternal Aunt    Cancer Paternal Uncle    Pulmonary fibrosis Maternal Aunt    Colon cancer Neg Hx     Social History   Socioeconomic History   Marital status: Widowed    Spouse name: Not on file   Number of children: 2   Years of  education: 12   Highest education level: High school graduate  Occupational History   Occupation: Retired    Comment: Glass blower/designer  Tobacco Use   Smoking status: Never   Smokeless tobacco: Never  Scientific laboratory technician Use: Never used  Substance and Sexual Activity   Alcohol use: No   Drug use: No   Sexual activity: Never  Other Topics Concern   Not on file  Social History Narrative   Elizabeth Mcintyre is widowed. Her young grandson lives with her, for whom she shares custody with her daughter, the son's aunt.     Right handed    Social Determinants of Health   Financial Resource Strain: Low Risk  (06/04/2021)   Overall Financial Resource Strain (CARDIA)    Difficulty of Paying  Living Expenses: Not hard at all  Food Insecurity: No Food Insecurity (06/04/2021)   Hunger Vital Sign    Worried About Running Out of Food in the Last Year: Never true    Ran Out of Food in the Last Year: Never true  Transportation Needs: No Transportation Needs (06/04/2021)   PRAPARE - Hydrologist (Medical): No    Lack of Transportation (Non-Medical): No  Physical Activity: Insufficiently Active (06/04/2021)   Exercise Vital Sign    Days of Exercise per Week: 5 days    Minutes of Exercise per Session: 10 min  Stress: No Stress Concern Present (06/04/2021)   Scissors    Feeling of Stress : Only a little  Social Connections: Socially Isolated (06/04/2021)   Social Connection and Isolation Panel [NHANES]    Frequency of Communication with Friends and Family: More than three times a week    Frequency of Social Gatherings with Friends and Family: More than three times a week    Attends Religious Services: Never    Marine scientist or Organizations: No    Attends Archivist Meetings: Never    Marital Status: Widowed  Intimate Partner Violence: Not At Risk (06/04/2021)   Humiliation, Afraid, Rape, and  Kick questionnaire    Fear of Current or Ex-Partner: No    Emotionally Abused: No    Physically Abused: No    Sexually Abused: No    Outpatient Medications Prior to Visit  Medication Sig Dispense Refill   acetaminophen (TYLENOL) 325 MG tablet Take 325 mg by mouth every 6 (six) hours as needed for moderate pain.      albuterol (VENTOLIN HFA) 108 (90 Base) MCG/ACT inhaler Inhale 2 puffs into the lungs every 6 (six) hours as needed for wheezing. 8.5 g 2   amLODipine (NORVASC) 10 MG tablet Take 10 mg by mouth daily.     Ascorbic Acid (VITAMIN C) 100 MG tablet Take 100 mg by mouth daily.     cetirizine (ZYRTEC) 10 MG tablet Take 10 mg by mouth at bedtime. As needed     Cholecalciferol (VITAMIN D3) 25 MCG (1000 UT) CAPS Take 1,000 Units by mouth daily.      donepezil (ARICEPT) 10 MG tablet TAKE 1 TABLET BY MOUTH DAILY 30 tablet 3   levothyroxine (SYNTHROID) 75 MCG tablet TAKE 1 TABLET(75 MCG) BY MOUTH DAILY 30 tablet 3   lidocaine-prilocaine (EMLA) cream Apply 1 application topically as needed. (Patient taking differently: Apply 1 application  topically as needed (port access).) 30 g 1   mirtazapine (REMERON) 7.5 MG tablet TAKE 1 TABLET(7.5 MG) BY MOUTH AT BEDTIME 30 tablet 5   pantoprazole (PROTONIX) 40 MG tablet TAKE 1 TABLET(40 MG) BY MOUTH DAILY 90 tablet 3   rOPINIRole (REQUIP) 0.25 MG tablet TAKE 1 TABLET(0.25 MG) BY MOUTH AT BEDTIME 90 tablet 1   rosuvastatin (CRESTOR) 20 MG tablet Take 1 tablet (20 mg total) by mouth daily. 90 tablet 3   sucralfate (CARAFATE) 1 g tablet Take 1 g by mouth 4 (four) times daily -  with meals and at bedtime. As needed     tiZANidine (ZANAFLEX) 4 MG tablet Take 1 tablet (4 mg total) by mouth at bedtime as needed for muscle spasms. Do not take with alcohol or while driving or operating heavy machinery 30 tablet 0   vitamin B-12 (CYANOCOBALAMIN) 1000 MCG tablet Take 1,000 mcg by mouth daily.  No facility-administered medications prior to visit.     Allergies  Allergen Reactions   Codeine Phosphate Nausea And Vomiting and Rash    Review of Systems  Constitutional:  Positive for fatigue. Negative for chills and fever.  HENT:  Negative for congestion, sinus pressure, sinus pain and sore throat.   Eyes:  Positive for visual disturbance. Negative for pain and discharge.  Respiratory:  Negative for cough and shortness of breath.   Cardiovascular:  Negative for chest pain and palpitations.  Gastrointestinal:  Positive for diarrhea. Negative for abdominal pain, nausea and vomiting.  Endocrine: Negative for polydipsia and polyuria.  Genitourinary:  Positive for urgency. Negative for dysuria and hematuria.  Musculoskeletal:  Positive for arthralgias (Right shoulder). Negative for neck pain and neck stiffness.  Skin:  Positive for rash.  Neurological:  Positive for dizziness. Negative for weakness.  Psychiatric/Behavioral:  Positive for dysphoric mood and sleep disturbance. Negative for agitation and behavioral problems.        Objective:    Physical Exam Vitals reviewed.  Constitutional:      General: She is not in acute distress.    Appearance: She is not diaphoretic.  HENT:     Head: Normocephalic and atraumatic.     Nose: Nose normal.     Mouth/Throat:     Mouth: Mucous membranes are moist.  Eyes:     General: No scleral icterus.    Extraocular Movements: Extraocular movements intact.  Cardiovascular:     Rate and Rhythm: Normal rate and regular rhythm.     Pulses: Normal pulses.     Heart sounds: Normal heart sounds. No murmur heard. Pulmonary:     Breath sounds: Normal breath sounds. No wheezing or rales.  Abdominal:     General: Bowel sounds are normal.     Palpations: Abdomen is soft.     Tenderness: There is abdominal tenderness (Mild, epigastric and RUQ).  Musculoskeletal:     Cervical back: Neck supple. No tenderness.     Right lower leg: No edema.     Left lower leg: No edema.     Comments: ROM at right  shoulder limited due to pain  Skin:    General: Skin is warm.     Findings: Rash (Vesicular rash on erythematous base on right thigh) present.  Neurological:     General: No focal deficit present.     Mental Status: She is alert and oriented to person, place, and time.     Sensory: No sensory deficit.     Motor: Weakness (B/l LE) present.  Psychiatric:        Mood and Affect: Mood normal.        Behavior: Behavior normal.     BP 132/70 (BP Location: Right Arm)   Pulse 94   Ht 5\' 2"  (1.575 m)   Wt 144 lb 3.2 oz (65.4 kg)   SpO2 97%   BMI 26.37 kg/m  Wt Readings from Last 3 Encounters:  12/02/22 144 lb 3.2 oz (65.4 kg)  10/09/22 137 lb 12.8 oz (62.5 kg)  08/14/22 133 lb (60.3 kg)        Assessment & Plan:   Problem List Items Addressed This Visit       Digestive   Gastroesophageal reflux disease with esophagitis    Needs to take Pantoprazole regularly, advised to take twice daily for 1 week Needs to take at least 2 meals in a day        Other  Herpes zoster without complication - Primary    Started valacyclovir 1000 mg TID Medrol Dosepak for burning pain      Relevant Medications   valACYclovir (VALTREX) 1000 MG tablet   methylPREDNISolone (MEDROL DOSEPAK) 4 MG TBPK tablet     Meds ordered this encounter  Medications   valACYclovir (VALTREX) 1000 MG tablet    Sig: Take 1 tablet (1,000 mg total) by mouth 3 (three) times daily for 7 days.    Dispense:  21 tablet    Refill:  0   methylPREDNISolone (MEDROL DOSEPAK) 4 MG TBPK tablet    Sig: Take as package instructions.    Dispense:  1 each    Refill:  0     Emilian Stawicki Keith Rake, MD

## 2022-12-02 NOTE — Assessment & Plan Note (Signed)
Started valacyclovir 1000 mg TID Medrol Dosepak for burning pain

## 2022-12-02 NOTE — Assessment & Plan Note (Signed)
Needs to take Pantoprazole regularly, advised to take twice daily for 1 week Needs to take at least 2 meals in a day

## 2022-12-02 NOTE — Patient Instructions (Signed)
Please start taking Valacyclovir and Prednisone as prescribed.  Please take Pantoprazole twice daily for 1 week.

## 2022-12-12 ENCOUNTER — Other Ambulatory Visit: Payer: Self-pay | Admitting: Internal Medicine

## 2022-12-12 ENCOUNTER — Telehealth: Payer: Self-pay

## 2022-12-12 DIAGNOSIS — B029 Zoster without complications: Secondary | ICD-10-CM

## 2022-12-12 MED ORDER — GABAPENTIN 100 MG PO CAPS: 100.0000 mg | ORAL_CAPSULE | Freq: Every day | ORAL | 2 refills | Status: DC

## 2022-12-12 MED ORDER — PREDNISONE 20 MG PO TABS: 40.0000 mg | ORAL_TABLET | Freq: Every day | ORAL | 0 refills | Status: DC

## 2022-12-12 NOTE — Telephone Encounter (Signed)
Left message on daughter voicemail

## 2022-12-27 ENCOUNTER — Other Ambulatory Visit: Payer: Self-pay | Admitting: Physician Assistant

## 2022-12-27 ENCOUNTER — Other Ambulatory Visit: Payer: Self-pay | Admitting: Internal Medicine

## 2022-12-27 DIAGNOSIS — E039 Hypothyroidism, unspecified: Secondary | ICD-10-CM

## 2023-01-22 ENCOUNTER — Ambulatory Visit (INDEPENDENT_AMBULATORY_CARE_PROVIDER_SITE_OTHER): Payer: Medicare HMO | Admitting: Internal Medicine

## 2023-01-22 ENCOUNTER — Encounter: Payer: Self-pay | Admitting: Internal Medicine

## 2023-01-22 VITALS — BP 136/70 | HR 104 | Ht 62.0 in | Wt 146.0 lb

## 2023-01-22 DIAGNOSIS — K2100 Gastro-esophageal reflux disease with esophagitis, without bleeding: Secondary | ICD-10-CM

## 2023-01-22 DIAGNOSIS — C8580 Other specified types of non-Hodgkin lymphoma, unspecified site: Secondary | ICD-10-CM

## 2023-01-22 DIAGNOSIS — R42 Dizziness and giddiness: Secondary | ICD-10-CM

## 2023-01-22 DIAGNOSIS — N1831 Chronic kidney disease, stage 3a: Secondary | ICD-10-CM

## 2023-01-22 DIAGNOSIS — B0229 Other postherpetic nervous system involvement: Secondary | ICD-10-CM | POA: Diagnosis not present

## 2023-01-22 DIAGNOSIS — E1122 Type 2 diabetes mellitus with diabetic chronic kidney disease: Secondary | ICD-10-CM | POA: Diagnosis not present

## 2023-01-22 NOTE — Assessment & Plan Note (Signed)
Needs to take Pantoprazole regularly Needs to take at least 2 meals in a day 

## 2023-01-22 NOTE — Assessment & Plan Note (Signed)
Check CMP - needs to improve hydration Avoid nephrotoxic agents 

## 2023-01-22 NOTE — Assessment & Plan Note (Addendum)
Has persistent burning pain in right thigh where she had shingles Continue gabapentin 100 mg qHS, may also help with insomnia

## 2023-01-22 NOTE — Patient Instructions (Signed)
Please start taking Gabapentin as prescribed.  Please maintain at least 50 ounces of fluid in a day.  Please avoid sudden positional changes.

## 2023-01-22 NOTE — Progress Notes (Signed)
Acute Office Visit  Subjective:    Patient ID: Elizabeth Mcintyre, female    DOB: 05/19/1941, 82 y.o.   MRN: [id number]  Chief Complaint  Patient presents with   Hernia    Patient thinks she has a hernia. Patient is gaining weight but concerned that it might be retaining fluid    HPI Patient is in today for complaint of generalized abdominal pain, mostly in RLQ, worse with bending down.  She has had severe colicky pain upon bending at times.  Denies any nausea or vomiting.  She is concerned about hernia, but there is no palpable bulge on physical exam today.  She also reports chronic, intermittent dizziness, worse with standing up.  Her orthostatic vitals were negative today.  She has history of poor p.o. hydration.  She also used to skip meals, but has been more consistent lately.  She also has been gaining weight lately and was worried about volume overload.  She does not have any leg swelling, orthopnea or PND currently.  She is on Remeron for insomnia, which can cause weight gain.  She has noticed improvement in her appetite with Remeron.  She reports persistent burning in the right thigh area, which has improved compared to prior.  She has stopped taking gabapentin as she thought it was only for acute shingles infection.  Past Medical History:  Diagnosis Date   Acquired hypothyroidism 05/30/2014   Acute cystitis without hematuria 10/09/2022   Acute pain of right shoulder 04/16/2022   Asthma    Bloating 04/26/2019   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   Cancer 2021   Lymphoma   Carotid artery stenosis 06/13/2016   Chronic SI joint pain    was on tramadol   Diverticulosis 03/2011   Dysphagia 04/18/2020   Essential hypertension, benign 11/09/2007   Current med losartan 100 mg qd, amlodopine 10 mg qd   Fall 07/22/2021   Fecal incontinence 07/07/2019   Gastroesophageal reflux disease with esophagitis 03/06/2015   Gout 06/09/2014   R great toe, ball of foot Stopped HCTZ several mos ago,   On losartan        Hematemesis 03/30/2020   Hepatic steatosis 01/04/2020   Herpes zoster without complication 12/02/2022   History of rheumatoid arthritis    during 30's, was treated.   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    Stress incontinence    Thrombocytopenia 01/04/2020   Type II diabetes mellitus 11/09/2007   diet control   Vitamin D deficiency 05/25/2015    Past Surgical History:  Procedure Laterality Date   ABDOMINAL HYSTERECTOMY     BALLOON DILATION N/A 08/21/2020   Procedure: BALLOON DILATION;  Surgeon: Lanelle Bal, DO;  Location: [facility];  Service: Endoscopy;  Laterality: N/A;   BIOPSY  03/30/2020   Procedure: BIOPSY;  Surgeon: Bernette Redbird, MD;  Location: [facility];  Service: Endoscopy;;   BIOPSY  05/02/2020   Procedure: BIOPSY;  Surgeon: Marguerita Merles, Reuel Boom, MD;  Location: [facility];  Service: Gastroenterology;;   CARDIAC CATHETERIZATION     X 2, last one in 1998   CHOLECYSTECTOMY N/A 04/13/2017   Procedure: LAPAROSCOPIC CHOLECYSTECTOMY;  Surgeon: Franky Macho, MD;  Location: [facility];  Service: General;  Laterality: N/A;   COLONOSCOPY     COLONOSCOPY  May 2012   Dr. Juanda Chance: mild diverticulosis, otherwise normal.     COLONOSCOPY WITH PROPOFOL N/A 05/02/2020   Dr. Levon Hedger: 8 mm polyp removed from the ascending colon, 2 mm polyp removed from the ascending colon.  Tubular adenomas.  Diverticulosis.  Mucosal ulceration in the sigmoid colon noted, pathology consistent with ischemic colitis.   ESOPHAGOGASTRODUODENOSCOPY N/A 01/28/2015   Dr. Jena Gauss: reflux esophagitis, Schatzki's ring not manipulated due to recent bleeding   ESOPHAGOGASTRODUODENOSCOPY N/A 03/30/2015   Dr. Jena Gauss: Schatzki's ring s/p Elease Hashimoto dilation, previously noted esophageal ulcer completely healed   ESOPHAGOGASTRODUODENOSCOPY N/A 03/30/2020   Buccini: Moderately severe erosive, circumferential, confluent esophagitis with no bleeding found 25 to 40 cm from incisors.  Nonobstructing and mild Schatzki ring, there were also multiple distal esophageal rings noted, minimal hiatal hernia.   ESOPHAGOGASTRODUODENOSCOPY (EGD) WITH PROPOFOL N/A 08/21/2020   Procedure: ESOPHAGOGASTRODUODENOSCOPY (EGD) WITH PROPOFOL;  Surgeon: Lanelle Bal, DO;  Location: [facility];  Service: Endoscopy;  Laterality: N/A;  2:15pm   IR IMAGING GUIDED PORT INSERTION  03/13/2020   IR REMOVAL TUN ACCESS W/ PORT W/O FL MOD SED  10/01/2021   LYMPH NODE BIOPSY Left 03/20/2020   Procedure: LEFT POSTERIOR CERVICAL LYMPH NODE BIOPSY;  Surgeon: Violeta Gelinas, MD;  Location[facility];  Service: General;  Laterality: Left;   MALONEY DILATION N/A 03/30/2015   Procedure: Alvy Beal;  Surgeon: Corbin Ade, MD;  Location: [facility];  Service: Endoscopy;  Laterality: N/A;   POLYPECTOMY  05/02/2020   Procedure: POLYPECTOMY;  Surgeon: Marguerita Merles, Reuel Boom, MD;  Location: [facility];  Service: Gastroenterology;;    Family History  Problem Relation Age of Onset   Heart disease Mother    Osteoarthritis Mother    Sudden death Father    Single kidney Father    Other Father        h/o severe MVA injuries   Hyperlipidemia Sister    Other Daughter        Myalgias    Fibromyalgia Daughter    Allergies Daughter    Heart disease Maternal Grandfather    Sudden death Paternal Grandmother    Diabetes Paternal Grandfather    Heart disease Daughter    Other Daughter        palpitations   Pulmonary fibrosis Maternal Aunt    Cancer Paternal Uncle    Pulmonary fibrosis Maternal Aunt    Colon cancer Neg Hx     Social History   Socioeconomic History   Marital status: Widowed    Spouse name: Not on file   Number of children: 2   Years of education: 12   Highest education level: High school graduate  Occupational History   Occupation: Retired    Comment: Print production planner  Tobacco Use   Smoking status: Never   Smokeless tobacco: Never  Building services engineer Use: Never used  Substance and Sexual Activity   Alcohol use: No  Drug use: No   Sexual activity: Never  Other Topics Concern   Not on file  Social History Narrative   Ms. Garrell is widowed. Her young grandson lives with her, for whom she shares custody with her daughter, the son's aunt.     Right handed    Social Determinants of Health   Financial Resource Strain: Low Risk  (06/04/2021)   Overall Financial Resource Strain (CARDIA)    Difficulty of Paying Living Expenses: Not hard at all  Food Insecurity: No Food Insecurity (06/04/2021)   Hunger Vital Sign    Worried About Running Out of Food in the Last Year: Never true    Ran Out of Food in the Last Year: Never true  Transportation Needs: No Transportation Needs (06/04/2021)   PRAPARE - Administrator, Civil Service (Medical): No    Lack of Transportation (Non-Medical): No  Physical Activity: Insufficiently Active (06/04/2021)   Exercise Vital Sign    Days of Exercise per Week: 5 days    Minutes of Exercise per Session: 10 min  Stress: No Stress Concern Present (06/04/2021)   [employer] of Occupational Health - Occupational Stress Questionnaire    Feeling of Stress : Only a little  Social Connections: Socially Isolated  (06/04/2021)   Social Connection and Isolation Panel [NHANES]    Frequency of Communication with Friends and Family: More than three times a week    Frequency of Social Gatherings with Friends and Family: More than three times a week    Attends Religious Services: Never    Database administrator or Organizations: No    Attends Banker Meetings: Never    Marital Status: Widowed  Intimate Partner Violence: Not At Risk (06/04/2021)   Humiliation, Afraid, Rape, and Kick questionnaire    Fear of Current or Ex-Partner: No    Emotionally Abused: No    Physically Abused: No    Sexually Abused: No    Outpatient Medications Prior to Visit  Medication Sig Dispense Refill   gabapentin (NEURONTIN) 100 MG capsule Take 1 capsule (100 mg total) by mouth at bedtime. 30 capsule 2   acetaminophen (TYLENOL) 325 MG tablet Take 325 mg by mouth every 6 (six) hours as needed for moderate pain.      albuterol (VENTOLIN HFA) 108 (90 Base) MCG/ACT inhaler Inhale 2 puffs into the lungs every 6 (six) hours as needed for wheezing. 8.5 g 2   amLODipine (NORVASC) 10 MG tablet Take 10 mg by mouth daily.     Ascorbic Acid (VITAMIN C) 100 MG tablet Take 100 mg by mouth daily.     cetirizine (ZYRTEC) 10 MG tablet Take 10 mg by mouth at bedtime. As needed     Cholecalciferol (VITAMIN D3) 25 MCG (1000 UT) CAPS Take 1,000 Units by mouth daily.      donepezil (ARICEPT) 10 MG tablet TAKE 1 TABLET BY MOUTH DAILY 30 tablet 3   levothyroxine (SYNTHROID) 75 MCG tablet TAKE 1 TABLET(75 MCG) BY MOUTH DAILY 30 tablet 3   lidocaine-prilocaine (EMLA) cream Apply 1 application topically as needed. (Patient taking differently: Apply 1 application  topically as needed (port access).) 30 g 1   mirtazapine (REMERON) 7.5 MG tablet TAKE 1 TABLET(7.5 MG) BY MOUTH AT BEDTIME 30 tablet 5   pantoprazole (PROTONIX) 40 MG tablet TAKE 1 TABLET(40 MG) BY MOUTH DAILY 90 tablet 3   rOPINIRole (REQUIP) 0.25 MG tablet TAKE 1 TABLET(0.25 MG) BY  MOUTH AT BEDTIME 90 tablet  1   rosuvastatin (CRESTOR) 20 MG tablet Take 1 tablet (20 mg total) by mouth daily. 90 tablet 3   sucralfate (CARAFATE) 1 g tablet Take 1 g by mouth 4 (four) times daily -  with meals and at bedtime. As needed     tiZANidine (ZANAFLEX) 4 MG tablet Take 1 tablet (4 mg total) by mouth at bedtime as needed for muscle spasms. Do not take with alcohol or while driving or operating heavy machinery 30 tablet 0   vitamin B-12 (CYANOCOBALAMIN) 1000 MCG tablet Take 1,000 mcg by mouth daily.      predniSONE (DELTASONE) 20 MG tablet Take 2 tablets (40 mg total) by mouth daily with breakfast. 10 tablet 0   No facility-administered medications prior to visit.    Allergies  Allergen Reactions   Codeine Phosphate Nausea And Vomiting and Rash    Review of Systems  Constitutional:  Positive for fatigue. Negative for chills and fever.  HENT:  Negative for congestion, sinus pressure, sinus pain and sore throat.   Eyes:  Positive for visual disturbance. Negative for pain and discharge.  Respiratory:  Negative for cough and shortness of breath.   Cardiovascular:  Negative for chest pain and palpitations.  Gastrointestinal:  Positive for abdominal pain and diarrhea. Negative for nausea and vomiting.  Endocrine: Negative for polydipsia and polyuria.  Genitourinary:  Positive for urgency. Negative for dysuria and hematuria.  Musculoskeletal:  Positive for arthralgias (Right shoulder). Negative for neck pain and neck stiffness.  Skin:  Negative for rash.  Neurological:  Positive for dizziness. Negative for weakness.  Psychiatric/Behavioral:  Positive for dysphoric mood and sleep disturbance. Negative for agitation and behavioral problems.        Objective:    Physical Exam Vitals reviewed.  Constitutional:      General: She is not in acute distress.    Appearance: She is not diaphoretic.  HENT:     Head: Normocephalic and atraumatic.     Nose: Nose normal.     Mouth/Throat:      Mouth: Mucous membranes are moist.  Eyes:     General: No scleral icterus.    Extraocular Movements: Extraocular movements intact.  Cardiovascular:     Rate and Rhythm: Normal rate and regular rhythm.     Pulses: Normal pulses.     Heart sounds: Normal heart sounds. No murmur heard. Pulmonary:     Breath sounds: Normal breath sounds. No wheezing or rales.  Abdominal:     General: Bowel sounds are normal.     Palpations: Abdomen is soft.     Tenderness: There is abdominal tenderness (Mild, epigastric, RUQ and RLQ).  Musculoskeletal:     Cervical back: Neck supple. No tenderness.     Right lower leg: No edema.     Left lower leg: No edema.     Comments: ROM at right shoulder limited due to pain  Skin:    General: Skin is warm.     Findings: No rash.  Neurological:     General: No focal deficit present.     Mental Status: She is alert and oriented to person, place, and time.     Sensory: No sensory deficit.     Motor: Weakness (B/l LE) present.  Psychiatric:        Mood and Affect: Mood normal.        Behavior: Behavior normal.     BP 136/70 (BP Location: Left Arm)   Pulse (!) 104   Ht 5'  2" (1.575 m)   Wt 146 lb (66.2 kg)   SpO2 98%   BMI 26.70 kg/m  Wt Readings from Last 3 Encounters:  01/22/23 146 lb (66.2 kg)  12/02/22 144 lb 3.2 oz (65.4 kg)  10/09/22 137 lb 12.8 oz (62.5 kg)        Assessment & Plan:   Problem List Items Addressed This Visit       Digestive   Gastroesophageal reflux disease with esophagitis - Primary    Needs to take Pantoprazole regularly Needs to take at least 2 meals in a day        Nervous and Auditory   Postherpetic neuralgia    Has persistent burning pain in right thigh where she had shingles Continue gabapentin 100 mg qHS, may also help with insomnia        Genitourinary   Stage 3a chronic kidney disease    Check CMP - needs to improve hydration Avoid nephrotoxic agents      Relevant Orders   Basic Metabolic  Panel (BMET)   Magnesium   CBC     Other   Postural dizziness    Could be due to poor p.o. intake of fluids related dehydration Although her orthostatic vitals are negative today, she could have orthostatic hypotension at home Needs to maintain adequate hydration and avoid skipping meals Avoid sudden positional changes      Marginal zone lymphoma    Follows up with Oncology Has generalized abdominal pain, check CT abdomen pelvis with contrast      Relevant Orders   CT Abdomen Pelvis W Contrast     No orders of the defined types were placed in this encounter.    Anabel Halon, MD

## 2023-01-22 NOTE — Assessment & Plan Note (Signed)
Follows up with Oncology Has generalized abdominal pain, check CT abdomen pelvis with contrast

## 2023-01-22 NOTE — Assessment & Plan Note (Signed)
Could be due to poor p.o. intake of fluids related dehydration Although her orthostatic vitals are negative today, she could have orthostatic hypotension at home Needs to maintain adequate hydration and avoid skipping meals Avoid sudden positional changes

## 2023-01-23 LAB — CBC
Hematocrit: 39.5 % (ref 34.0–46.6)
Hemoglobin: 13 g/dL (ref 11.1–15.9)
MCH: 29.2 pg (ref 26.6–33.0)
MCHC: 32.9 g/dL (ref 31.5–35.7)
MCV: 89 fL (ref 79–97)
Platelets: 222 10*3/uL (ref 150–450)
RBC: 4.45 x10E6/uL (ref 3.77–5.28)
RDW: 14.3 % (ref 11.7–15.4)
WBC: 9.8 10*3/uL (ref 3.4–10.8)

## 2023-01-23 LAB — BASIC METABOLIC PANEL
BUN/Creatinine Ratio: 11 — ABNORMAL LOW (ref 12–28)
BUN: 16 mg/dL (ref 8–27)
CO2: 18 mmol/L — ABNORMAL LOW (ref 20–29)
Calcium: 9.7 mg/dL (ref 8.7–10.3)
Chloride: 102 mmol/L (ref 96–106)
Creatinine, Ser: 1.4 mg/dL — ABNORMAL HIGH (ref 0.57–1.00)
Glucose: 196 mg/dL — ABNORMAL HIGH (ref 70–99)
Potassium: 3.7 mmol/L (ref 3.5–5.2)
Sodium: 142 mmol/L (ref 134–144)
eGFR: 38 mL/min/{1.73_m2} — ABNORMAL LOW (ref >59)

## 2023-01-23 LAB — MAGNESIUM: Magnesium: 2 mg/dL (ref 1.6–2.3)

## 2023-02-06 ENCOUNTER — Other Ambulatory Visit: Payer: Self-pay | Admitting: Hematology and Oncology

## 2023-02-06 ENCOUNTER — Inpatient Hospital Stay (HOSPITAL_BASED_OUTPATIENT_CLINIC_OR_DEPARTMENT_OTHER): Payer: Medicare HMO | Admitting: Hematology and Oncology

## 2023-02-06 ENCOUNTER — Inpatient Hospital Stay: Payer: Medicare HMO | Attending: Hematology and Oncology

## 2023-02-06 VITALS — BP 156/70 | HR 94 | Temp 97.5°F | Resp 17 | Wt 145.8 lb

## 2023-02-06 DIAGNOSIS — C8580 Other specified types of non-Hodgkin lymphoma, unspecified site: Secondary | ICD-10-CM

## 2023-02-06 DIAGNOSIS — Z79899 Other long term (current) drug therapy: Secondary | ICD-10-CM | POA: Diagnosis not present

## 2023-02-06 DIAGNOSIS — C8514 Unspecified B-cell lymphoma, lymph nodes of axilla and upper limb: Secondary | ICD-10-CM | POA: Insufficient documentation

## 2023-02-06 LAB — CMP (CANCER CENTER ONLY)
ALT: 25 U/L (ref 0–44)
AST: 24 U/L (ref 15–41)
Albumin: 4.5 g/dL (ref 3.5–5.0)
Alkaline Phosphatase: 68 U/L (ref 38–126)
Anion gap: 10 (ref 5–15)
BUN: 18 mg/dL (ref 8–23)
CO2: 27 mmol/L (ref 22–32)
Calcium: 9.8 mg/dL (ref 8.9–10.3)
Chloride: 104 mmol/L (ref 98–111)
Creatinine: 1.28 mg/dL — ABNORMAL HIGH (ref 0.44–1.00)
GFR, Estimated: 42 mL/min — ABNORMAL LOW (ref >60)
Glucose, Bld: 181 mg/dL — ABNORMAL HIGH (ref 70–99)
Potassium: 3.9 mmol/L (ref 3.5–5.1)
Sodium: 141 mmol/L (ref 135–145)
Total Bilirubin: 0.4 mg/dL (ref 0.3–1.2)
Total Protein: 6.8 g/dL (ref 6.5–8.1)

## 2023-02-06 LAB — CBC WITH DIFFERENTIAL (CANCER CENTER ONLY)
Abs Immature Granulocytes: 0.04 10*3/uL (ref 0.00–0.07)
Basophils Absolute: 0.1 10*3/uL (ref 0.0–0.1)
Basophils Relative: 1 %
Eosinophils Absolute: 0.9 10*3/uL — ABNORMAL HIGH (ref 0.0–0.5)
Eosinophils Relative: 9 %
HCT: 39.1 % (ref 36.0–46.0)
Hemoglobin: 13.5 g/dL (ref 12.0–15.0)
Immature Granulocytes: 0 %
Lymphocytes Relative: 44 %
Lymphs Abs: 4.9 10*3/uL — ABNORMAL HIGH (ref 0.7–4.0)
MCH: 29.9 pg (ref 26.0–34.0)
MCHC: 34.5 g/dL (ref 30.0–36.0)
MCV: 86.5 fL (ref 80.0–100.0)
Monocytes Absolute: 1 10*3/uL (ref 0.1–1.0)
Monocytes Relative: 9 %
Neutro Abs: 4 10*3/uL (ref 1.7–7.7)
Neutrophils Relative %: 37 %
Platelet Count: 245 10*3/uL (ref 150–400)
RBC: 4.52 MIL/uL (ref 3.87–5.11)
RDW: 13.5 % (ref 11.5–15.5)
WBC Count: 10.9 10*3/uL — ABNORMAL HIGH (ref 4.0–10.5)
nRBC: 0 % (ref 0.0–0.2)

## 2023-02-06 LAB — LACTATE DEHYDROGENASE: LDH: 181 U/L (ref 98–192)

## 2023-02-06 NOTE — Progress Notes (Unsigned)
[facility] [phone]   [phone]  PROGRESS NOTE  Patient Care Team: Anabel Halon, MD as PCP - General (Internal Medicine) Rollene Rotunda, MD as Consulting Physician (Cardiology) Gelene Mink, NP (Gastroenterology) Marguerita Merles, Reuel Boom, MD as Consulting Physician (Gastroenterology) Lanelle Bal, DO as Consulting Physician (Internal Medicine) Van Clines, MD as Consulting Physician (Neurology)  Hematological/Oncological History  # Low Grade Marginal Zone Lymphoma, Stage III 1) 06/06/2019: patient underwent a diagnostic mammogram of the left breast. Calcifications noted in the left breast, recommended 6 month f/u imaging. 2) 02/02/2020: bilateral diagnostic mammogram showed abnormal enlarged lymph nodes with cortical thickening. The largest of these measures 2.2 x 1.7 x 2.0 cm.  3) 02/02/2020: US guided biopsy of left axillary lymph nodes performed. 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 The overall features are atypical and highly suspicious for non-Hodgkin B-cell lymphoma, particularly marginal zone lymphoma. 4) 02/16/2020: establish care with Dr. Leonides Schanz  5) 02/29/2020: PET CT scan performed, showed hypermetabolic lymph nodes identified in the posterior left neck, both axillary/subpectoral regions, mediastinum, hila, right external iliac chain, and right groin. Massive splenomegaly of 17.8 cm noted as well.  6) 03/20/2020: excisional biopsy of left posterior cervical lymph node. Biopsy confirmed most likely low grade marginal zone lymphoma.   7) 7/14-7/15/2021: Cycle 1 Day 1 of Rituximab/Bendamycin 8) 03/30/2020-04/01/2020: admitted inpatient for hematemesis. Noted to have marked esophagitis on EGD.  9) 04/25/2020: IV feraheme 510mg  administered 10) 04/26/2020: Cycle 2 Day 1 of Rituximab/Bendamycin 11) 05/10/2020: HOLD R-Benda in setting of colitis, GI bleed, and  intolerance to therapy. Plan to start monotherapy Rituximab once patient has rebounded.  12) 07/06/2020: Cycle 1 Week 1 of monotherapy rituximab 13) 07/30/2020: Cycle 1 Week 3 of monotherapy rituximab 14) 08/06/2020: Completed Cycle 1 of monotherapy rituximab 15)  09/12/2020: PET CT scan shows completed resolution of tumor and FDG avid lesions. Deauville Score 1. Complete response.  16) 11/16/2020: Cycle 1 Day 1 of Maintenance Rituximab  17) 01/10/2021:  Cycle 2 Day 1 of Maintenance Rituximab  18) 03/07/2021: Cycle 3 Day 1 of Maintenance Rituximab  19) 05/09/2021: Cycle 4 Day 1 of Maintenance Rituximab  20) 08/12/2021: PET CT scan showed no evidence of residual recurrent disease.  Interval History:  Elizabeth Mcintyre 82 y.o. 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   On exam today, Elizabeth Mcintyre reports she has been well overall and interim since her last visit.  She has been having some lower abdominal pain and is currently scheduled for a CT scan on 02/12/2023 in order to evaluate this further.  This has been accompanied with a decrease in her appetite, though her weight has fortunately remained stable.  She is at 145 pounds today.  She has not noticed any lymphadenopathy, bumps, or lumps, though she does not regularly check.  She notes that she is not having any issues with fevers, chills, sweats.  She reports her energy is about 8 out of 10 though most the time she is "lazy".  Overall she is at her baseline level of health.  She does not have any lymphadenopathy, fevers, chills, sweats, nausea, vomiting or diarrhea. The rest of the 10 point ROS is negative.    MEDICAL HISTORY:  Past Medical History:  Diagnosis Date   Acquired hypothyroidism 05/30/2014   Acute cystitis without hematuria 10/09/2022   Acute pain of  right shoulder 04/16/2022   Asthma    Bloating 04/26/2019   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   Cancer 2021    Lymphoma   Carotid artery stenosis 06/13/2016   Chronic SI joint pain    was on tramadol   Diverticulosis 03/2011   Dysphagia 04/18/2020   Essential hypertension, benign 11/09/2007   Current med losartan 100 mg qd, amlodopine 10 mg qd   Fall 07/22/2021   Fecal incontinence 07/07/2019   Gastroesophageal reflux disease with esophagitis 03/06/2015   Gout 06/09/2014   R great toe, ball of foot Stopped HCTZ several mos ago,  On losartan        Hematemesis 03/30/2020   Hepatic steatosis 01/04/2020   Herpes zoster without complication 12/02/2022   History of rheumatoid arthritis    during 30's, was treated.   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    Stress incontinence    Thrombocytopenia 01/04/2020   Type II diabetes mellitus 11/09/2007   diet control   Vitamin D deficiency 05/25/2015    SURGICAL HISTORY: Past Surgical History:  Procedure Laterality Date   ABDOMINAL HYSTERECTOMY     BALLOON DILATION N/A 08/21/2020   Procedure: BALLOON DILATION;  Surgeon: Lanelle Bal, DO;  Location: [facility];  Service: Endoscopy;  Laterality: N/A;   BIOPSY  03/30/2020   Procedure: BIOPSY;  Surgeon: Bernette Redbird, MD;  Location: [facility];  Service: Endoscopy;;    BIOPSY  05/02/2020   Procedure: BIOPSY;  Surgeon: Marguerita Merles, Reuel Boom, MD;  Location: [facility];  Service: Gastroenterology;;   CARDIAC CATHETERIZATION     X 2, last one in 1998   CHOLECYSTECTOMY N/A 04/13/2017   Procedure: LAPAROSCOPIC CHOLECYSTECTOMY;  Surgeon: Franky Macho, MD;  Location: [facility];  Service: General;  Laterality: N/A;   COLONOSCOPY     COLONOSCOPY  May 2012   Dr. Juanda Chance: mild diverticulosis, otherwise normal.    COLONOSCOPY WITH PROPOFOL N/A 05/02/2020   Dr. Levon Hedger: 8 mm polyp removed from the ascending colon, 2 mm polyp removed from the ascending colon.  Tubular adenomas.  Diverticulosis.  Mucosal ulceration in the sigmoid colon noted, pathology consistent with ischemic colitis.   ESOPHAGOGASTRODUODENOSCOPY N/A 01/28/2015   Dr. Jena Gauss: reflux esophagitis, Schatzki's ring not manipulated due to recent bleeding   ESOPHAGOGASTRODUODENOSCOPY N/A 03/30/2015   Dr. Jena Gauss: Schatzki's ring s/p Elease Hashimoto dilation, previously noted esophageal ulcer completely healed   ESOPHAGOGASTRODUODENOSCOPY N/A 03/30/2020   Buccini: Moderately severe erosive, circumferential, confluent esophagitis with no bleeding found 25 to 40 cm from incisors.  Nonobstructing and mild Schatzki ring, there were also multiple distal esophageal rings noted, minimal hiatal hernia.   ESOPHAGOGASTRODUODENOSCOPY (EGD) WITH PROPOFOL N/A 08/21/2020   Procedure: ESOPHAGOGASTRODUODENOSCOPY (EGD) WITH PROPOFOL;  Surgeon: Lanelle Bal, DO;  Location: [facility];  Service: Endoscopy;  Laterality: N/A;  2:15pm   IR IMAGING GUIDED PORT INSERTION  03/13/2020   IR REMOVAL TUN ACCESS W/ PORT W/O FL MOD SED  10/01/2021   LYMPH NODE BIOPSY Left 03/20/2020   Procedure: LEFT POSTERIOR CERVICAL LYMPH NODE BIOPSY;  Surgeon: Violeta Gelinas, MD;  Location: [facility];  Service: General;  Laterality: Left;   MALONEY DILATION N/A 03/30/2015   Procedure: Alvy Beal;  Surgeon: Corbin Ade, MD;  Location: [facility];  Service:  Endoscopy;  Laterality: N/A;   POLYPECTOMY  05/02/2020   Procedure: POLYPECTOMY;  Surgeon: Dolores Frame, MD;  Location: [facility];  Service: Gastroenterology;;    SOCIAL HISTORY: Social History   Socioeconomic History   Marital status: Widowed    Spouse name: Not on file   Number of children: 2   Years of education: 12   Highest education level: High school graduate  Occupational History   Occupation: Retired    Comment: Print production planner  Tobacco Use   Smoking status: Never   Smokeless tobacco: Never  Building services engineer Use: Never used  Substance and Sexual Activity   Alcohol use: No   Drug use: No   Sexual activity: Never  Other Topics Concern   Not on file  Social History Narrative   Ms. Fausnaugh is widowed. Her young grandson lives with her, for whom she shares custody with her daughter, the son's aunt.     Right handed    Social Determinants of Health   Financial Resource Strain: Low Risk  (06/04/2021)   Overall Financial Resource Strain (CARDIA)    Difficulty of Paying Living Expenses: Not hard at all  Food Insecurity: No Food Insecurity (06/04/2021)   Hunger Vital Sign    Worried About Running Out of Food in the Last Year: Never true    Ran Out of Food in the Last Year: Never true  Transportation Needs: No Transportation Needs (06/04/2021)   PRAPARE - Administrator, Civil Service (Medical): No    Lack of Transportation (Non-Medical): No  Physical Activity: Insufficiently Active (06/04/2021)   Exercise Vital Sign    Days of Exercise per Week: 5 days    Minutes of Exercise per Session: 10 min  Stress: No Stress Concern Present (06/04/2021)   [employer] of Occupational Health - Occupational Stress Questionnaire    Feeling of Stress : Only a little  Social Connections: Socially Isolated (06/04/2021)   Social Connection and Isolation Panel [NHANES]    Frequency of Communication with Friends and Family: More than three times a week     Frequency of Social Gatherings with Friends and Family: More than three times a week    Attends Religious Services: Never    Database administrator or Organizations: No    Attends Banker Meetings: Never    Marital Status: Widowed  Intimate Partner Violence: Not At Risk (06/04/2021)   Humiliation, Afraid, Rape, and Kick questionnaire    Fear of Current or Ex-Partner: No    Emotionally Abused: No    Physically Abused: No    Sexually Abused: No    FAMILY HISTORY: Family History  Problem Relation Age of Onset   Heart disease Mother    Osteoarthritis Mother    Sudden death Father    Single kidney Father    Other Father        h/o severe MVA injuries   Hyperlipidemia Sister  Other Daughter        Myalgias   Fibromyalgia Daughter    Allergies Daughter    Heart disease Maternal Grandfather    Sudden death Paternal Grandmother    Diabetes Paternal Grandfather    Heart disease Daughter    Other Daughter        palpitations   Pulmonary fibrosis Maternal Aunt    Cancer Paternal Uncle    Pulmonary fibrosis Maternal Aunt    Colon cancer Neg Hx     ALLERGIES:  is allergic to codeine phosphate.  MEDICATIONS:  Current Outpatient Medications  Medication Sig Dispense Refill   gabapentin (NEURONTIN) 100 MG capsule Take 1 capsule (100 mg total) by mouth at bedtime. 30 capsule 2   acetaminophen (TYLENOL) 325 MG tablet Take 325 mg by mouth every 6 (six) hours as needed for moderate pain.      albuterol (VENTOLIN HFA) 108 (90 Base) MCG/ACT inhaler Inhale 2 puffs into the lungs every 6 (six) hours as needed for wheezing. 8.5 g 2   amLODipine (NORVASC) 10 MG tablet Take 10 mg by mouth daily.     Ascorbic Acid (VITAMIN C) 100 MG tablet Take 100 mg by mouth daily.     cetirizine (ZYRTEC) 10 MG tablet Take 10 mg by mouth at bedtime. As needed     Cholecalciferol (VITAMIN D3) 25 MCG (1000 UT) CAPS Take 1,000 Units by mouth daily.      donepezil (ARICEPT) 10 MG tablet TAKE 1 TABLET  BY MOUTH DAILY 30 tablet 3   levothyroxine (SYNTHROID) 75 MCG tablet TAKE 1 TABLET(75 MCG) BY MOUTH DAILY 30 tablet 3   lidocaine-prilocaine (EMLA) cream Apply 1 application topically as needed. (Patient taking differently: Apply 1 application  topically as needed (port access).) 30 g 1   mirtazapine (REMERON) 7.5 MG tablet TAKE 1 TABLET(7.5 MG) BY MOUTH AT BEDTIME 30 tablet 5   pantoprazole (PROTONIX) 40 MG tablet TAKE 1 TABLET(40 MG) BY MOUTH DAILY 90 tablet 3   rOPINIRole (REQUIP) 0.25 MG tablet TAKE 1 TABLET(0.25 MG) BY MOUTH AT BEDTIME 90 tablet 1   rosuvastatin (CRESTOR) 20 MG tablet Take 1 tablet (20 mg total) by mouth daily. 90 tablet 3   sucralfate (CARAFATE) 1 g tablet Take 1 g by mouth 4 (four) times daily -  with meals and at bedtime. As needed     tiZANidine (ZANAFLEX) 4 MG tablet Take 1 tablet (4 mg total) by mouth at bedtime as needed for muscle spasms. Do not take with alcohol or while driving or operating heavy machinery 30 tablet 0   vitamin B-12 (CYANOCOBALAMIN) 1000 MCG tablet Take 1,000 mcg by mouth daily.      No current facility-administered medications for this visit.    REVIEW OF SYSTEMS:   Constitutional: ( - ) fevers, ( - )  chills , ( - ) night sweats Eyes: ( - ) blurriness of vision, ( - ) double vision, ( - ) watery eyes Ears, nose, mouth, throat, and face: ( - ) mucositis, ( - ) sore throat Respiratory: ( - ) cough, ( - ) dyspnea, ( - ) wheezes Cardiovascular: ( - ) palpitation, ( - ) chest discomfort, ( - ) lower extremity swelling Gastrointestinal:  ( - ) nausea, ( - ) heartburn, ( - ) change in bowel habits Skin: ( - ) abnormal skin rashes Lymphatics: ( - ) new lymphadenopathy, ( - ) easy bruising Neurological: ( - ) numbness, ( - ) tingling, ( - )  new weaknesses Behavioral/Psych: ( - ) mood change, ( - ) new changes  All other systems were reviewed with the patient and are negative.  PHYSICAL EXAMINATION: ECOG PERFORMANCE STATUS: 1 - Symptomatic but  completely ambulatory  Vitals:   02/06/23 1147  BP: (!) 156/70  Pulse: 94  Resp: 17  Temp: (!) 97.5 F (36.4 C)  SpO2: 98%     Filed Weights   02/06/23 1147  Weight: 145 lb 12.8 oz (66.1 kg)    GENERAL: well appearing elderly Caucasian female in NAD  SKIN: skin color, texture, turgor are normal. EYES: conjunctiva are pink and non-injected, sclera clear NECK: supple, non-tender.  LYMPH: no cervical, supraclavicular, or axillary lymph nodes palpated.  LUNGS: clear to auscultation and percussion with normal breathing effort HEART: regular rate & rhythm and no murmurs and no lower extremity edema Musculoskeletal: no cyanosis of digits and no clubbing  PSYCH: alert & oriented x 3, fluent speech NEURO: no focal motor/sensory deficits  LABORATORY DATA:  I have reviewed the data as listed    Latest Ref Rng & Units 02/06/2023   10:47 AM 01/22/2023    3:20 PM 08/01/2022    2:39 PM  CBC  WBC 4.0 - 10.5 K/uL 10.9  9.8  8.6   Hemoglobin 12.0 - 15.0 g/dL 16.1  09.6  04.5   Hematocrit 36.0 - 46.0 % 39.1  39.5  35.8   Platelets 150 - 400 K/uL 245  222  232        Latest Ref Rng & Units 02/06/2023   10:47 AM 01/22/2023    3:20 PM 10/09/2022    1:41 PM  CMP  Glucose 70 - 99 mg/dL 409  811  914   BUN 8 - 23 mg/dL 18  16  23    Creatinine 0.44 - 1.00 mg/dL 7.82  9.56  2.13   Sodium 135 - 145 mmol/L 141  142  143   Potassium 3.5 - 5.1 mmol/L 3.9  3.7  4.9   Chloride 98 - 111 mmol/L 104  102  101   CO2 22 - 32 mmol/L 27  18  23    Calcium 8.9 - 10.3 mg/dL 9.8  9.7  08.6   Total Protein 6.5 - 8.1 g/dL 6.8     Total Bilirubin 0.3 - 1.2 mg/dL 0.4     Alkaline Phos 38 - 126 U/L 68     AST 15 - 41 U/L 24     ALT 0 - 44 U/L 25       RADIOGRAPHIC STUDIES: No results found.  ASSESSMENT & PLAN Elizabeth Mcintyre 82 y.o. 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  Previously I recommend a rituximab monotherapy  given her intolerance of BR x 2 cycles. This regimen consisted of rituximab 375 mg/m2 IV q weekly x 4 weeks. During her scans on 8/16 - 05/03/2020 she was shown to have marked response to therapy with normalization of spleen size and resolution of lymphadenopathy. Repeat PET CT scan on 09/12/20 after completed of rituximab monotherapy showed a completed response. We will proceed with q 2 months maintenance/consolidation rituximab x 4 cycles.   GELF Criteria: 1 (splenomegaly). Indication for treatment  # Low Grade Marginal Zone Lymphoma, Stage III -- discontinued bendamustine + rituximab as patient is unable to tolerate this treatment. Will pursue rituximab monotherapy instead. Started therapy with Cycle 1 Day 1 on 03/28/2020 BR, decreased to Ritux  alone q weekly x 4 starting 07/06/2020. --patient completed excisional lymph node biopsy to confirm the diagnosis. Pathological exam of the lymph node confirms marginal zone lymphoma.  --PET CT scan confirms stage III disease. Patient meets GELF criteria for treatment (splenomegaly). She did not have any B symptoms and her counts were stable at diagnosis, with some mild thrombocytopenia.  --Patient completed monotherapy rituximab x 4 weeks on 08/06/2020.  --post treatment PET CT scan from 09/12/20 showed Deauville Score 1, complete response to therapy. Recommend to proceed with q 8 week rituximab 375mg /m2 maintenance therapy. This was continued for 4 doses --Patient started C1D1 of maintenance Rituximab on 11/16/2020.  --Patient completed 4 cycles of maintenance Rituximab.  Plan: -- Labs today show white blood cell count white blood cell 10.9, hemoglobin 13.5, MCV 86.5, and platelets of 245 -- PET scan performed on 08/13/2021 showed no evidence of residual disease.  CT scan in May 2022 showed no evidence of recurrent disease/relapse.  Because that scan shows no evidence of residual recurrent disease no further scans are recommended. --Return to the clinic in 6  months for continued monitoring.  No orders of the defined types were placed in this encounter.  All questions were answered. The patient knows to call the clinic with any problems, questions or concerns.  A total of more than 30 minutes were spent on this encounter and over half of that time was spent on counseling and coordination of care as outlined above.   Ulysees Barns, MD Department of Hematology/Oncology [facility] at [facility] Phone: [phone] Pager: [phone] Email: [email or web address]  02/07/2023 6:19 PM   Literature Support:  NCCN Guidelines: Consolidation with rituximab should be offered if initially treated with single agent rituximab. (MS-63)  UptoDate: Different schedules have been used. The following administration schedules were used in the randomized trials and are equally acceptable approaches:  ?Rituximab 375 mg/m2 intravenously per week for a total of four doses  ?Rituximab 375 mg/m2 intravenously per week for four weeks, followed by four additional doses administered every two months

## 2023-02-11 ENCOUNTER — Ambulatory Visit (HOSPITAL_COMMUNITY)
Admission: RE | Admit: 2023-02-11 | Discharge: 2023-02-11 | Disposition: A | Discharge status: Home / Self Care | Payer: Medicare HMO | Source: Ambulatory Visit | Attending: Internal Medicine | Admitting: Internal Medicine

## 2023-02-11 DIAGNOSIS — C8580 Other specified types of non-Hodgkin lymphoma, unspecified site: Secondary | ICD-10-CM | POA: Insufficient documentation

## 2023-02-11 DIAGNOSIS — R109 Unspecified abdominal pain: Secondary | ICD-10-CM | POA: Diagnosis not present

## 2023-02-11 DIAGNOSIS — I700 Atherosclerosis of aorta: Secondary | ICD-10-CM | POA: Diagnosis not present

## 2023-02-11 MED ORDER — IOHEXOL 300 MG/ML  SOLN
80.0000 mL | Freq: Once | INTRAMUSCULAR | Status: AC | PRN
  Administered 2023-02-11: 80 mL via INTRAVENOUS

## 2023-02-13 ENCOUNTER — Ambulatory Visit: Payer: Medicare HMO | Admitting: Physician Assistant

## 2023-02-19 ENCOUNTER — Encounter: Payer: Self-pay | Admitting: Internal Medicine

## 2023-02-19 ENCOUNTER — Ambulatory Visit (INDEPENDENT_AMBULATORY_CARE_PROVIDER_SITE_OTHER): Payer: Medicare HMO | Admitting: Internal Medicine

## 2023-02-19 VITALS — BP 128/76 | HR 100 | Ht 62.0 in | Wt 146.4 lb

## 2023-02-19 DIAGNOSIS — R197 Diarrhea, unspecified: Secondary | ICD-10-CM

## 2023-02-19 DIAGNOSIS — N393 Stress incontinence (female) (male): Secondary | ICD-10-CM

## 2023-02-19 DIAGNOSIS — R7303 Prediabetes: Secondary | ICD-10-CM | POA: Diagnosis not present

## 2023-02-19 DIAGNOSIS — K2100 Gastro-esophageal reflux disease with esophagitis, without bleeding: Secondary | ICD-10-CM

## 2023-02-19 DIAGNOSIS — B0229 Other postherpetic nervous system involvement: Secondary | ICD-10-CM

## 2023-02-19 DIAGNOSIS — E119 Type 2 diabetes mellitus without complications: Secondary | ICD-10-CM

## 2023-02-19 LAB — POCT GLYCOSYLATED HEMOGLOBIN (HGB A1C): HbA1c, POC (controlled diabetic range): 6.3 % (ref 0.0–7.0)

## 2023-02-19 MED ORDER — GABAPENTIN 100 MG PO CAPS
100.0000 mg | ORAL_CAPSULE | Freq: Every day | ORAL | 2 refills | Status: DC
Start: 2023-02-19 — End: 2023-05-14

## 2023-02-19 MED ORDER — SUCRALFATE 1 G PO TABS: 1.0000 g | ORAL_TABLET | Freq: Three times a day (TID) | ORAL | 0 refills | Status: DC

## 2023-02-19 NOTE — Patient Instructions (Signed)
Please get stool test done in this week.  Please maintain adequate hydration by taking at least 64 ounces of fluid in a day.  Please start taking Sucralfate as prescribed.  Please avoid hot, spicy food.

## 2023-02-20 ENCOUNTER — Encounter: Payer: Self-pay | Admitting: Internal Medicine

## 2023-02-20 DIAGNOSIS — R7303 Prediabetes: Secondary | ICD-10-CM | POA: Insufficient documentation

## 2023-02-20 NOTE — Assessment & Plan Note (Signed)
Has persistent burning pain in right thigh where she had shingles Continue gabapentin 100 mg qHS, may also help with insomnia 

## 2023-02-20 NOTE — Progress Notes (Signed)
Acute Office Visit  Subjective:    Patient ID: Elizabeth Mcintyre, female    DOB: [birth year], 82 y.o.   MRN: [id number]  Chief Complaint  Patient presents with   Abdominal Pain    Patient is still having abdominal pain with diarrhea     HPI Patient is in today for c/o loose BM to watery diarrhea for the last 2 weeks.  She had CT abdomen done on 05/29, which was negative for any acute etiology.  She denies any fever or chills.  She reports epigastric and right lower quadrant abdominal pain, which is intermittent, sharp and nonradiating.  Denies any dysuria or hematuria currently.  Denies any melena or hematochezia currently.  She is taking pantoprazole for GERD.  She still has numbness and tingling in the right thigh area, where she had shingles infection.  She has completed valacyclovir and was given gabapentin, but she has lot been taking it currently.  Denies any vesicles in the thigh area currently.  She also reports urinary incontinence, especially with bending, laughing and sneezing.  Denies any urgency currently.  Past Medical History:  Diagnosis Date   Acquired hypothyroidism 05/30/2014   Acute cystitis without hematuria 10/09/2022   Acute pain of right shoulder 04/16/2022   Asthma    Bloating 04/26/2019   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   Cancer 2021   Lymphoma   Carotid artery stenosis 06/13/2016   Chronic SI joint pain    was on tramadol   Diverticulosis 03/2011   Dysphagia 04/18/2020   Essential hypertension, benign 11/09/2007   Current med losartan 100 mg qd, amlodopine 10 mg qd   Fall 07/22/2021   Fecal incontinence 07/07/2019   Gastroesophageal reflux disease with esophagitis 03/06/2015   Gout 06/09/2014   R great toe, ball of foot Stopped HCTZ several mos ago,  On losartan        Hematemesis 03/30/2020   Hepatic steatosis 01/04/2020   Herpes zoster without complication 12/02/2022   History of rheumatoid arthritis    during 30's, was treated.   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    Stress incontinence    Thrombocytopenia 01/04/2020   Type II diabetes mellitus 11/09/2007   diet control   Vitamin D deficiency 05/25/2015    Past Surgical History:  Procedure Laterality Date   ABDOMINAL HYSTERECTOMY     BALLOON DILATION N/A 08/21/2020   Procedure: BALLOON DILATION;  Surgeon: Lanelle Bal, DO;  Location: [facility];  Service: Endoscopy;  Laterality: N/A;   BIOPSY  03/30/2020   Procedure: BIOPSY;  Surgeon: Bernette Redbird, MD;  Location: [facility];  Service: Endoscopy;;   BIOPSY  05/02/2020   Procedure: BIOPSY;  Surgeon: Dolores Frame, MD;  Location: [facility];  Service: Gastroenterology;;   CARDIAC CATHETERIZATION     X 2, last one in 1998   CHOLECYSTECTOMY N/A 04/13/2017   Procedure: LAPAROSCOPIC CHOLECYSTECTOMY;  Surgeon: Franky Macho, MD;  Location: [facility];  Service: General;  Laterality: N/A;   COLONOSCOPY     COLONOSCOPY  May 2012   Dr. Juanda Chance: mild diverticulosis, otherwise normal.    COLONOSCOPY WITH PROPOFOL N/A 05/02/2020   Dr. Levon Hedger: 8 mm polyp removed from the ascending colon, 2 mm polyp removed from the ascending colon.  Tubular adenomas.  Diverticulosis.  Mucosal ulceration in the  sigmoid colon noted, pathology consistent with ischemic colitis.   ESOPHAGOGASTRODUODENOSCOPY N/A 01/28/2015   Dr. Jena Gauss: reflux esophagitis, Schatzki's ring not manipulated due to recent bleeding   ESOPHAGOGASTRODUODENOSCOPY N/A 03/30/2015   Dr. Jena Gauss: Schatzki's ring s/p Elease Hashimoto dilation, previously noted esophageal ulcer completely healed   ESOPHAGOGASTRODUODENOSCOPY N/A 03/30/2020   Buccini: Moderately severe erosive, circumferential, confluent esophagitis with no bleeding found 25 to 40 cm from incisors.  Nonobstructing and mild Schatzki ring, there were also multiple distal esophageal rings noted, minimal hiatal hernia.   ESOPHAGOGASTRODUODENOSCOPY (EGD) WITH PROPOFOL N/A 08/21/2020   Procedure: ESOPHAGOGASTRODUODENOSCOPY (EGD) WITH PROPOFOL;  Surgeon: Lanelle Bal, DO;  Location: [facility];  Service: Endoscopy;  Laterality: N/A;  2:15pm   IR IMAGING GUIDED PORT INSERTION  03/13/2020   IR REMOVAL TUN ACCESS W/ PORT W/O FL MOD SED  10/01/2021   LYMPH NODE BIOPSY Left 03/20/2020   Procedure: LEFT POSTERIOR CERVICAL LYMPH NODE BIOPSY;  Surgeon: Violeta Gelinas, MD;  Location[facility];  Service: General;  Laterality: Left;   MALONEY DILATION N/A 03/30/2015   Procedure: Alvy Beal;  Surgeon: Corbin Ade, MD;  Location: [facility];  Service: Endoscopy;  Laterality: N/A;   POLYPECTOMY  05/02/2020   Procedure: POLYPECTOMY;  Surgeon: Marguerita Merles, Reuel Boom, MD;  Location: [facility];  Service: Gastroenterology;;    Family History  Problem Relation Age of Onset   Heart disease Mother    Osteoarthritis Mother    Sudden death Father    Single kidney Father    Other Father        h/o severe MVA injuries   Hyperlipidemia Sister    Other Daughter        Myalgias   Fibromyalgia Daughter    Allergies Daughter    Heart disease Maternal Grandfather    Sudden death Paternal Grandmother    Diabetes Paternal Grandfather    Heart disease Daughter    Other Daughter         palpitations   Pulmonary fibrosis Maternal Aunt    Cancer Paternal Uncle    Pulmonary fibrosis Maternal Aunt    Colon cancer Neg Hx     Social History   Socioeconomic History   Marital status: Widowed    Spouse name: Not on file   Number of children: 2   Years of education: 12   Highest education level: High school graduate  Occupational History   Occupation: Retired    Comment: Print production planner  Tobacco Use   Smoking status: Never   Smokeless tobacco: Never  Building services engineer Use: Never used  Substance and Sexual Activity   Alcohol use: No   Drug use: No   Sexual activity: Never  Other Topics Concern   Not on file  Social History Narrative   Ms. Muchnick is widowed. Her young grandson lives with her, for whom she shares custody with her daughter,  the son's aunt.     Right handed    Social Determinants of Health   Financial Resource Strain: Low Risk  (06/04/2021)   Overall Financial Resource Strain (CARDIA)    Difficulty of Paying Living Expenses: Not hard at all  Food Insecurity: No Food Insecurity (06/04/2021)   Hunger Vital Sign    Worried About Running Out of Food in the Last Year: Never true    Ran Out of Food in the Last Year: Never true  Transportation Needs: No Transportation Needs (06/04/2021)   PRAPARE - Administrator, Civil Service (Medical): No    Lack of Transportation (Non-Medical): No  Physical Activity: Insufficiently Active (06/04/2021)   Exercise Vital Sign    Days of Exercise per Week: 5 days    Minutes of Exercise per Session: 10 min  Stress: No Stress Concern Present (06/04/2021)   [employer] of Occupational Health - Occupational Stress Questionnaire    Feeling of Stress : Only a little  Social Connections: Socially Isolated (06/04/2021)   Social Connection and Isolation Panel [NHANES]    Frequency of Communication with Friends and Family: More than three times a week    Frequency of Social Gatherings with Friends and Family:  More than three times a week    Attends Religious Services: Never    Database administrator or Organizations: No    Attends Banker Meetings: Never    Marital Status: Widowed  Intimate Partner Violence: Not At Risk (06/04/2021)   Humiliation, Afraid, Rape, and Kick questionnaire    Fear of Current or Ex-Partner: No    Emotionally Abused: No    Physically Abused: No    Sexually Abused: No    Outpatient Medications Prior to Visit  Medication Sig Dispense Refill   acetaminophen (TYLENOL) 325 MG tablet Take 325 mg by mouth every 6 (six) hours as needed for moderate pain.      albuterol (VENTOLIN HFA) 108 (90 Base) MCG/ACT inhaler Inhale 2 puffs into the lungs every 6 (six) hours as needed for wheezing. 8.5 g 2   amLODipine (NORVASC) 10 MG tablet Take 10 mg by mouth daily.     Ascorbic Acid (VITAMIN C) 100 MG tablet Take 100 mg by mouth daily.     cetirizine (ZYRTEC) 10 MG tablet Take 10 mg by mouth at bedtime. As needed     Cholecalciferol (VITAMIN D3) 25 MCG (1000 UT) CAPS Take 1,000 Units by mouth daily.      donepezil (ARICEPT) 10 MG tablet TAKE 1 TABLET BY MOUTH DAILY 30 tablet 3   levothyroxine (SYNTHROID) 75 MCG tablet TAKE 1 TABLET(75 MCG) BY MOUTH DAILY 30 tablet 3   lidocaine-prilocaine (EMLA) cream Apply 1 application topically as needed. (Patient taking differently: Apply 1 application  topically as needed (port access).) 30 g 1   mirtazapine (REMERON) 7.5 MG tablet TAKE 1 TABLET(7.5 MG) BY MOUTH AT BEDTIME 30 tablet 5   pantoprazole (PROTONIX) 40 MG tablet TAKE 1 TABLET(40 MG) BY MOUTH DAILY 90 tablet 3   rOPINIRole (REQUIP) 0.25 MG tablet TAKE 1 TABLET(0.25 MG) BY MOUTH AT BEDTIME 90 tablet 1   rosuvastatin (CRESTOR) 20 MG tablet Take 1 tablet (20 mg total) by mouth daily. 90 tablet 3   tiZANidine (ZANAFLEX) 4 MG tablet Take 1 tablet (4 mg total) by mouth at bedtime as needed for muscle spasms. Do not take with alcohol or while driving or operating heavy machinery 30  tablet 0   vitamin B-12 (  CYANOCOBALAMIN) 1000 MCG tablet Take 1,000 mcg by mouth daily.      gabapentin (NEURONTIN) 100 MG capsule Take 1 capsule (100 mg total) by mouth at bedtime. 30 capsule 2   sucralfate (CARAFATE) 1 g tablet Take 1 g by mouth 4 (four) times daily -  with meals and at bedtime. As needed     No facility-administered medications prior to visit.    Allergies  Allergen Reactions   Codeine Phosphate Nausea And Vomiting and Rash    Review of Systems  Constitutional:  Positive for fatigue. Negative for chills and fever.  HENT:  Negative for congestion, sinus pressure, sinus pain and sore throat.   Eyes:  Positive for visual disturbance. Negative for pain and discharge.  Respiratory:  Negative for cough and shortness of breath.   Cardiovascular:  Negative for chest pain and palpitations.  Gastrointestinal:  Positive for abdominal pain and diarrhea. Negative for nausea and vomiting.  Endocrine: Negative for polydipsia and polyuria.  Genitourinary:  Negative for dysuria and hematuria.  Musculoskeletal:  Positive for arthralgias (Right shoulder). Negative for neck pain and neck stiffness.  Skin:  Negative for rash.  Neurological:  Positive for dizziness. Negative for weakness.  Psychiatric/Behavioral:  Positive for dysphoric mood and sleep disturbance. Negative for agitation and behavioral problems.        Objective:    Physical Exam Vitals reviewed.  Constitutional:      General: She is not in acute distress.    Appearance: She is not diaphoretic.  HENT:     Head: Normocephalic and atraumatic.     Nose: Nose normal.     Mouth/Throat:     Mouth: Mucous membranes are moist.  Eyes:     General: No scleral icterus.    Extraocular Movements: Extraocular movements intact.  Cardiovascular:     Rate and Rhythm: Normal rate and regular rhythm.     Pulses: Normal pulses.     Heart sounds: Normal heart sounds. No murmur heard. Pulmonary:     Breath sounds: Normal  breath sounds. No wheezing or rales.  Abdominal:     General: Bowel sounds are normal.     Palpations: Abdomen is soft.     Tenderness: There is abdominal tenderness (Mild, epigastric, RUQ and RLQ).  Musculoskeletal:     Cervical back: Neck supple. No tenderness.     Right lower leg: No edema.     Left lower leg: No edema.     Comments: ROM at right shoulder limited due to pain  Skin:    General: Skin is warm.     Findings: No rash.  Neurological:     General: No focal deficit present.     Mental Status: She is alert and oriented to person, place, and time.     Sensory: No sensory deficit.     Motor: Weakness (B/l LE) present.  Psychiatric:        Mood and Affect: Mood normal.        Behavior: Behavior normal.     BP 128/76 (BP Location: Right Arm, Patient Position: Sitting, Cuff Size: Normal)   Pulse 100   Ht 5\' 2"  (1.575 m)   Wt 146 lb 6.4 oz (66.4 kg)   SpO2 97%   BMI 26.78 kg/m  Wt Readings from Last 3 Encounters:  02/19/23 146 lb 6.4 oz (66.4 kg)  02/06/23 145 lb 12.8 oz (66.1 kg)  01/22/23 146 lb (66.2 kg)        Assessment & Plan:  Problem List Items Addressed This Visit       Digestive   Gastroesophageal reflux disease with esophagitis    Needs to take Pantoprazole regularly Needs to take at least 2 meals in a day Added sucralfate for persistent acid reflux      Relevant Medications   sucralfate (CARAFATE) 1 g tablet   Diarrhea of presumed infectious origin - Primary    Reports watery diarrhea for last few days Unclear etiology at the time of office visit - check GI profile Recent CT abdomen reviewed, negative for any acute etiology      Relevant Orders   GI Profile, Stool, PCR     Nervous and Auditory   Postherpetic neuralgia    Has persistent burning pain in right thigh where she had shingles Continue gabapentin 100 mg qHS, may also help with insomnia      Relevant Medications   gabapentin (NEURONTIN) 100 MG capsule     Other   Stress  incontinence of urine    Her urinary incontinence is likely due to stress incontinence Kegel exercises material provided Scheduled voiding      Prediabetes    Lab Results  Component Value Date   HGBA1C 6.3 02/19/2023  Advised to follow low-carb diet      Relevant Orders   POCT glycosylated hemoglobin (Hb A1C) (Completed)     Meds ordered this encounter  Medications   gabapentin (NEURONTIN) 100 MG capsule    Sig: Take 1 capsule (100 mg total) by mouth at bedtime.    Dispense:  30 capsule    Refill:  2   sucralfate (CARAFATE) 1 g tablet    Sig: Take 1 tablet (1 g total) by mouth 4 (four) times daily -  with meals and at bedtime. As needed    Dispense:  120 tablet    Refill:  0     Tracker Mance Concha Se, MD

## 2023-02-20 NOTE — Assessment & Plan Note (Signed)
Needs to take Pantoprazole regularly Needs to take at least 2 meals in a day Added sucralfate for persistent acid reflux

## 2023-02-20 NOTE — Assessment & Plan Note (Signed)
Reports watery diarrhea for last few days Unclear etiology at the time of office visit - check GI profile Recent CT abdomen reviewed, negative for any acute etiology

## 2023-02-20 NOTE — Assessment & Plan Note (Signed)
Lab Results  Component Value Date   HGBA1C 6.3 02/19/2023   Advised to follow low-carb diet

## 2023-02-20 NOTE — Assessment & Plan Note (Addendum)
Her urinary incontinence is likely due to stress incontinence Kegel exercises material provided Scheduled voiding

## 2023-02-22 LAB — GI PROFILE, STOOL, PCR

## 2023-02-25 ENCOUNTER — Other Ambulatory Visit: Payer: Self-pay | Admitting: Internal Medicine

## 2023-02-25 DIAGNOSIS — F339 Major depressive disorder, recurrent, unspecified: Secondary | ICD-10-CM

## 2023-03-27 ENCOUNTER — Other Ambulatory Visit: Payer: Self-pay | Admitting: Physician Assistant

## 2023-03-27 ENCOUNTER — Other Ambulatory Visit: Payer: Self-pay | Admitting: Internal Medicine

## 2023-03-27 ENCOUNTER — Ambulatory Visit: Payer: Medicare HMO | Admitting: Physician Assistant

## 2023-03-27 DIAGNOSIS — K2100 Gastro-esophageal reflux disease with esophagitis, without bleeding: Secondary | ICD-10-CM

## 2023-03-27 DIAGNOSIS — E039 Hypothyroidism, unspecified: Secondary | ICD-10-CM

## 2023-04-03 ENCOUNTER — Ambulatory Visit: Payer: Medicare HMO | Admitting: Physician Assistant

## 2023-04-17 ENCOUNTER — Ambulatory Visit: Payer: Medicare HMO | Admitting: Physician Assistant

## 2023-04-17 ENCOUNTER — Encounter: Payer: Self-pay | Admitting: Physician Assistant

## 2023-04-17 VITALS — BP 112/72 | HR 103 | Resp 18 | Ht 62.0 in | Wt 141.0 lb

## 2023-04-17 DIAGNOSIS — F03A0 Unspecified dementia, mild, without behavioral disturbance, psychotic disturbance, mood disturbance, and anxiety: Secondary | ICD-10-CM

## 2023-04-17 NOTE — Progress Notes (Signed)
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 Patient is currently on donepezil 10 mg daily. Memory is stable, MMSE today is 28/30. She has had issues with depression, and is going to resume Lexapro as per PCP. She is able to drive and perform ADLS without difficulty.      Follow up in  6 months. Continue donepezil 10 mg daily, side effects discussed Recommend good control of her cardiovascular risk factors Continue to control mood as per PCP, resume Lexapro  Continue B12 and vitamin D supplements. Recommend increasing social activities    Subjective:    This patient is accompanied in the office by her daughter who supplements the history.  Previous records as well as any outside records available were reviewed prior to todays visit. Patient was last seen on 08/14/22    Any changes in memory since last visit? " Some good and bad days".  She has some difficulty with names of people, and sometimes remembering recent conversation, she continues to have dj vu's.  LTM normal. Takes care of her 8 cats.   repeats oneself?  Endorsed, especially in the afternoon. Disoriented when walking into a room?  Patient denies    Leaving objects in unusual places?  She constantly rearranges the room, rearrange his stuff   Wandering behavior?  denies   Any personality changes since last visit?  denies   Any worsening depression?:  Denies.   Hallucinations or paranoia?  Denies.   Seizures? denies    Any sleep changes?  She stays more in the night an sleeps longer in the day, has reported vivid dreams, denies REM behavior or  sleepwalking   Sleep apnea?   Denies.   Any hygiene concerns? Sink bath, she does not shower that often.  Independent of bathing and dressing?  Endorsed  Does the patient needs help with medications?  Daughter is in charge   Who is in charge of the finances?  Daughter is in charge     Any changes in appetite?  denies     Patient have trouble swallowing? Denies.   Does the patient cook? No Any headaches? Chronic occasional L sided  headaches, takes tylenol.  Does not drink water and takes sodas  Chronic back pain  denies   Ambulates with difficulty? Denies.    Recent falls or head injuries? denies     Unilateral weakness, numbness or tingling? denies   Any tremors?  Denies   Any anosmia?  Denies   Any incontinence of urine?  Some urge incontinence  Endorsed, wears pads any bowel dysfunction?   Denies      Patient lives with her son  Does the patient drive? Short distances    Neuropsych evaluation 11/19/2022  Briefly, results suggested primary impairments surrounding cognitive flexibility and the majority of both receptive and expressive language abilities. She also exhibited impairments surrounding delayed retrieval aspects of memory with concerns for rapid forgetting. Performances were appropriate relative to age-matched peers across processing speed, attention/concentration, visuospatial abilities, encoding (i.e., learning) aspects of memory, and recognition/consolidation aspects of memory. Relative to her previous evaluation in July 2022, the majority of performances were stable  despite the utilization of different instrumentation. Story-based memory actually saw some improvement over time, whereas receptive language represented her greatest area of decline. Functionally, Elizabeth Mcintyre's family has taken over medication management. She has limited driving behaviors and her daughter described greater difficulties with navigation and instances where she has gotten lost. Elizabeth Mcintyre, Elizabeth Mcintyre  denied all concerns. Her daughter noted that Elizabeth Mcintyre appears unable to appreciate the value of finances and has been spending or losing large amounts of money. This has created fears that Elizabeth Mcintyre and her family will not have sufficient funds to care for her in the future. Of note, at the conclusion of the current evaluation, Elizabeth Mcintyre was observed attempting to hand a staff member a $50 bill. When this staff member refused to accept this gift, Elizabeth Mcintyre reportedly stated "well nobody has to know" and indicated that she has "a lot of money." This interaction mirrored her daughter's previous report of Elizabeth Mcintyre mismanaging finances very closely. I would also be fearful of her vulnerability to fall for a future financial scam. 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  PREVIOUS MEDICATIONS:   CURRENT MEDICATIONS:  Outpatient Encounter Medications as of 04/17/2023  Medication Sig   acetaminophen (TYLENOL) 325 MG tablet Take 325 mg by mouth every 6 (six) hours as needed for moderate pain.    amLODipine (NORVASC) 10 MG tablet Take 10 mg by mouth daily.   Ascorbic Acid (VITAMIN C) 100 MG tablet Take 100 mg by mouth daily.   cetirizine (ZYRTEC) 10 MG tablet Take 10 mg by mouth at bedtime. As needed   Cholecalciferol (VITAMIN D3) 25 MCG (1000 UT) CAPS Take 1,000 Units by mouth daily.    donepezil (ARICEPT) 10 MG tablet TAKE 1 TABLET BY MOUTH DAILY   gabapentin (NEURONTIN) 100 MG capsule Take 1 capsule (100 mg total) by mouth at bedtime.   levothyroxine (SYNTHROID) 75 MCG tablet TAKE 1 TABLET(75 MCG) BY MOUTH DAILY   lidocaine-prilocaine (EMLA) cream Apply 1 application topically as needed. (Patient taking differently: Apply 1 application  topically as needed (port access).)   mirtazapine (REMERON) 7.5 MG tablet TAKE 1 TABLET(7.5 MG) BY MOUTH AT BEDTIME   pantoprazole  (PROTONIX) 40 MG tablet TAKE 1 TABLET(40 MG) BY MOUTH DAILY   rOPINIRole (REQUIP) 0.25 MG tablet TAKE 1 TABLET(0.25 MG) BY MOUTH AT BEDTIME   rosuvastatin (CRESTOR) 20 MG tablet Take 1 tablet (20 mg total) by mouth daily.   sucralfate (CARAFATE) 1 g tablet TAKE 1 TABLET(1 GRAM) BY MOUTH FOUR TIMES DAILY- AT BEDTIME WITH MEALS AS NEEDED   tiZANidine (ZANAFLEX) 4 MG tablet Take 1 tablet (4 mg total) by mouth at bedtime as needed for muscle spasms. Do not take with alcohol or while driving or operating heavy machinery   vitamin B-12 (CYANOCOBALAMIN) 1000 MCG tablet Take 1,000 mcg by mouth daily.    albuterol (VENTOLIN HFA) 108 (90 Base) MCG/ACT inhaler Inhale 2 puffs into the lungs every 6 (six) hours as needed for wheezing.   No facility-administered encounter medications on file as of 04/17/2023.       04/17/2023    3:00 PM 02/11/2022    3:00 PM 03/27/2021    2:00 PM  MMSE - Mini Mental State Exam  Orientation to time 5 4 5   Orientation to Place 5 5 5   Registration 3 3 3   Attention/ Calculation 5 5 3   Recall 1  1 2  Language- name 2 objects 2 2 2   Language- repeat 1 1 1   Language- follow 3 step command 3 2 1   Language- read & follow direction 1 1 1   Write a sentence 1 0 1  Copy design 1 0 1  Total score 28 24 25       01/17/2021    9:00 AM  Montreal Cognitive Assessment   Visuospatial/ Executive (0/5) 1  Naming (0/3) 0  Attention: Read list of digits (0/2) 2  Attention: Read list of letters (0/1) 0  Attention: Serial 7 subtraction starting at 100 (0/3) 2  Language: Repeat phrase (0/2) 1  Language : Fluency (0/1) 0  Abstraction (0/2) 0  Delayed Recall (0/5) 1  Orientation (0/6) 6  Total 13  Adjusted Score (based on education) 14    Objective:     PHYSICAL EXAMINATION:    VITALS:   Vitals:   04/17/23 1447  BP: 112/72  Pulse: (!) 103  Resp: 18  SpO2: 99%  Weight: 141 lb (64 kg)  Height: 5\' 2"  (1.575 m)    GEN:  The patient appears stated age and is in NAD. HEENT:   Normocephalic, atraumatic.   Neurological examination:  General: NAD, well-groomed, appears stated age. Orientation: The patient is alert. Oriented to person, place and date Cranial nerves: There is good facial symmetry.The speech is fluent and clear. No aphasia or dysarthria. Fund of knowledge is appropriate. Recent memory impaired, remote memory is normal. Attention and concentration are normal.  Able to name objects and repeat phrases.  Hearing is intact to conversational tone.   Sensation: Sensation is intact to light touch throughout Motor: Strength is at least antigravity x4. DTR's 2/4 in UE/LE     Movement examination: Tone: There is normal tone in the UE/LE Abnormal movements:  no tremor.  No myoclonus.  No asterixis.   Coordination:  There is no decremation with RAM's. Normal finger to nose  Gait and Station: The patient has no difficulty arising out of a deep-seated chair without the use of the hands. The patient's stride length is good.  Gait is cautious and narrow.    Thank you for allowing [place] the opportunity to participate in the care of this nice patient. Please do not hesitate to contact us for any questions or concerns.   Total time spent on today's visit was 31 minutes dedicated to this patient today, preparing to see patient, examining the patient, ordering tests and/or medications and counseling the patient, documenting clinical information in the EHR or other health record, independently interpreting results and communicating results to the patient/family, discussing treatment and goals, answering patient's questions and coordinating care.  Cc:  Anabel Halon, MD  Marlowe Kays 04/17/2023 3:28 PM

## 2023-04-17 NOTE — Patient Instructions (Signed)
It was a pleasure to see you today at our office.   Recommendations:  Follow up in  6 months Continue donepezil 10 mg daily. Side effects were discussed  Resume the mood medications as per primary doctor  Continue the vitamins   Whom to call:  Memory  decline, memory medications: Call out office [phone]   For psychiatric meds, mood meds: Please have your primary care physician manage these medications.   Counseling regarding caregiver distress, including caregiver depression, anxiety and issues regarding community resources, adult day care programs, adult living facilities, or memory care questions:   Feel free to contact Misty Lisabeth Register, Social Worker at [phone]   For assessment of decision of mental capacity and competency:  Call Dr. Erick Blinks, geriatric psychiatrist at [phone]  For guidance in geriatric dementia issues please call [facility] [phone]  For guidance regarding [facility] and if placement were needed at the facility, contact Sidney Ace, Social Worker tel: [phone]  If you have any severe symptoms of a stroke, or other severe issues such as confusion,severe chills or fever, etc call 911 or go to the ER as you may need to be evaluate further  Consider [facility]  [address], [place] [address] [phone]  Hours of Operation Mondays to Thursdays: 8 am to 8 pm,Fridays: 9 am to 8 pm, Saturdays: 9 am to 1 pm Sundays: Closed  [email or web address]    Feel free to visit Facebook page " [employer]" for tips of how to care for people with memory problems.         RECOMMENDATIONS FOR ALL PATIENTS WITH MEMORY PROBLEMS: 1. Continue to exercise (Recommend 30 minutes of walking everyday, or 3 hours every week) 2. Increase social interactions - continue going to [place] and enjoy social gatherings with  friends and family 3. Eat healthy, avoid fried foods and eat more fruits and vegetables 4. Maintain adequate blood pressure, blood sugar, and blood cholesterol level. Reducing the risk of stroke and cardiovascular disease also helps promoting better memory. 5. Avoid stressful situations. Live a simple life and avoid aggravations. Organize your time and prepare for the next day in anticipation. 6. Sleep well, avoid any interruptions of sleep and avoid any distractions in the bedroom that may interfere with adequate sleep quality 7. Avoid sugar, avoid sweets as there is a strong link between excessive sugar intake, diabetes, and cognitive impairment We discussed the Mediterranean diet, which has been shown to help patients reduce the risk of progressive memory disorders and reduces cardiovascular risk. This includes eating fish, eat fruits and green leafy vegetables, nuts like almonds and hazelnuts, walnuts, and also use olive oil. Avoid fast foods and fried foods as much as possible. Avoid sweets and sugar as sugar use has been linked to worsening of memory function.  There is always a concern of gradual progression of memory problems. If this is the case, then we may need to adjust level of care according to patient needs. Support, both to the patient and caregiver, should then be put into place.    The Alzheimer's Association is here all day, every day for people facing Alzheimer's disease through our free 24/7 Helpline: (669)761-4553. The Helpline provides reliable information and support to all those who need assistance, such as individuals living with memory loss, Alzheimer's or other dementia, caregivers, health care professionals and the public.  Our highly trained and knowledgeable staff can help you with: Understanding memory loss, dementia and Alzheimer's  Medications and other treatment options  General information about aging and brain health  Skills to provide quality care and to find the  best care from professionals  Legal, financial and living-arrangement decisions Our Helpline also features: Confidential care consultation provided by master's level clinicians who can help with decision-making support, crisis assistance and education on issues families face every day  Help in a caller's preferred language using our translation service that features more than 200 languages and dialects  Referrals to local community programs, services and ongoing support     FALL PRECAUTIONS: Be cautious when walking. Scan the area for obstacles that may increase the risk of trips and falls. When getting up in the mornings, sit up at the edge of the bed for a few minutes before getting out of bed. Consider elevating the bed at the head end to avoid drop of blood pressure when getting up. Walk always in a well-lit room (use night lights in the walls). Avoid area rugs or power cords from appliances in the middle of the walkways. Use a walker or a cane if necessary and consider physical therapy for balance exercise. Get your eyesight checked regularly.  FINANCIAL OVERSIGHT: Supervision, especially oversight when making financial decisions or transactions is also recommended.  HOME SAFETY: Consider the safety of the kitchen when operating appliances like stoves, microwave oven, and blender. Consider having supervision and share cooking responsibilities until no longer able to participate in those. Accidents with firearms and other hazards in the house should be identified and addressed as well.   ABILITY TO BE LEFT ALONE: If patient is unable to contact 911 operator, consider using LifeLine, or when the need is there, arrange for someone to stay with patients. Smoking is a fire hazard, consider supervision or cessation. Risk of wandering should be assessed by caregiver and if detected at any point, supervision and safe proof recommendations should be instituted.  MEDICATION SUPERVISION: Inability to  self-administer medication needs to be constantly addressed. Implement a mechanism to ensure safe administration of the medications.   DRIVING: Regarding driving, in patients with progressive memory problems, driving will be impaired. We advise to have someone else do the driving if trouble finding directions or if minor accidents are reported. Independent driving assessment is available to determine safety of driving.   If you are interested in the driving assessment, you can contact the following:  [employer] in [place] [phone]  [facility] [phone]  [facility] [phone] or [phone]      Mediterranean Diet A Mediterranean diet refers to food and lifestyle choices that are based on the traditions of countries located on the [employer]. This way of eating has been shown to help prevent certain conditions and improve outcomes for people who have chronic diseases, like kidney disease and heart disease. What are tips for following this plan? Lifestyle  Cook and eat meals together with your family, when possible. Drink enough fluid to keep your urine clear or pale yellow. Be physically active every day. This includes: Aerobic exercise like running or swimming. Leisure activities like gardening, walking, or housework. Get 7-8 hours of sleep each night. If recommended by your health care provider, drink red wine in moderation. This means 1 glass a day for nonpregnant women and 2 glasses a day for men. A glass of wine equals 5 oz (150 mL). Reading food labels  Check the serving size of packaged foods. For foods such as rice and  pasta, the serving size refers to the amount of cooked product, not dry. Check the total fat in packaged foods. Avoid foods that have saturated fat or trans fats. Check the ingredients list for added sugars, such as corn syrup. Shopping  At the grocery store, buy  most of your food from the areas near the walls of the store. This includes: Fresh fruits and vegetables (produce). Grains, beans, nuts, and seeds. Some of these may be available in unpackaged forms or large amounts (in bulk). Fresh seafood. Poultry and eggs. Low-fat dairy products. Buy whole ingredients instead of prepackaged foods. Buy fresh fruits and vegetables in-season from local farmers markets. Buy frozen fruits and vegetables in resealable bags. If you do not have access to quality fresh seafood, buy precooked frozen shrimp or canned fish, such as tuna, salmon, or sardines. Buy small amounts of raw or cooked vegetables, salads, or olives from the deli or salad bar at your store. Stock your pantry so you always have certain foods on hand, such as olive oil, canned tuna, canned tomatoes, rice, pasta, and beans. Cooking  Cook foods with extra-virgin olive oil instead of using butter or other vegetable oils. Have meat as a side dish, and have vegetables or grains as your main dish. This means having meat in small portions or adding small amounts of meat to foods like pasta or stew. Use beans or vegetables instead of meat in common dishes like chili or lasagna. Experiment with different cooking methods. Try roasting or broiling vegetables instead of steaming or sauteing them. Add frozen vegetables to soups, stews, pasta, or rice. Add nuts or seeds for added healthy fat at each meal. You can add these to yogurt, salads, or vegetable dishes. Marinate fish or vegetables using olive oil, lemon juice, garlic, and fresh herbs. Meal planning  Plan to eat 1 vegetarian meal one day each week. Try to work up to 2 vegetarian meals, if possible. Eat seafood 2 or more times a week. Have healthy snacks readily available, such as: Vegetable sticks with hummus. Greek yogurt. Fruit and nut trail mix. Eat balanced meals throughout the week. This includes: Fruit: 2-3 servings a day Vegetables: 4-5  servings a day Low-fat dairy: 2 servings a day Fish, poultry, or lean meat: 1 serving a day Beans and legumes: 2 or more servings a week Nuts and seeds: 1-2 servings a day Whole grains: 6-8 servings a day Extra-virgin olive oil: 3-4 servings a day Limit red meat and sweets to only a few servings a month What are my food choices? Mediterranean diet Recommended Grains: Whole-grain pasta. Brown rice. Bulgar wheat. Polenta. Couscous. Whole-wheat bread. Orpah Cobb. Vegetables: Artichokes. Beets. Broccoli. Cabbage. Carrots. Eggplant. Green beans. Chard. Kale. Spinach. Onions. Leeks. Peas. Squash. Tomatoes. Peppers. Radishes. Fruits: Apples. Apricots. Avocado. Berries. Bananas. Cherries. Dates. Figs. Grapes. Lemons. Melon. Oranges. Peaches. Plums. Pomegranate. Meats and other protein foods: Beans. Almonds. Sunflower seeds. Pine nuts. Peanuts. Cod. Salmon. Scallops. Shrimp. Tuna. Tilapia. Clams. Oysters. Eggs. Dairy: Low-fat milk. Cheese. Greek yogurt. Beverages: Water. Red wine. Herbal tea. Fats and oils: Extra virgin olive oil. Avocado oil. Grape seed oil. Sweets and desserts: [place] yogurt with honey. Baked apples. Poached pears. Trail mix. Seasoning and other foods: Basil. Cilantro. Coriander. Cumin. Mint. Parsley. Sage. Rosemary. Tarragon. Garlic. Oregano. Thyme. Pepper. Balsalmic vinegar. Tahini. Hummus. Tomato sauce. Olives. Mushrooms. Limit these Grains: Prepackaged pasta or rice dishes. Prepackaged cereal with added sugar. Vegetables: Deep fried potatoes (french fries). Fruits: Fruit canned in syrup. Meats and other protein  foods: Beef. Pork. Lamb. Poultry with skin. Hot dogs. Tomasa Blase. Dairy: Ice cream. Sour cream. Whole milk. Beverages: Juice. Sugar-sweetened soft drinks. Beer. Liquor and spirits. Fats and oils: Butter. Canola oil. Vegetable oil. Beef fat (tallow). Lard. Sweets and desserts: Cookies. Cakes. Pies. Candy. Seasoning and other foods: Mayonnaise. Premade sauces and  marinades. The items listed may not be a complete list. Talk with your dietitian about what dietary choices are right for you. Summary The Mediterranean diet includes both food and lifestyle choices. Eat a variety of fresh fruits and vegetables, beans, nuts, seeds, and whole grains. Limit the amount of red meat and sweets that you eat. Talk with your health care provider about whether it is safe for you to drink red wine in moderation. This means 1 glass a day for nonpregnant women and 2 glasses a day for men. A glass of wine equals 5 oz (150 mL). This information is not intended to replace advice given to you by your health care provider. Make sure you discuss any questions you have with your health care provider. Document Released: 04/24/2016 Document Revised: 05/27/2016 Document Reviewed: 04/24/2016 Elsevier Interactive Patient Education  2017 [employer].    e

## 2023-04-26 ENCOUNTER — Other Ambulatory Visit: Payer: Self-pay | Admitting: Internal Medicine

## 2023-04-26 DIAGNOSIS — G2581 Restless legs syndrome: Secondary | ICD-10-CM

## 2023-04-26 DIAGNOSIS — E782 Mixed hyperlipidemia: Secondary | ICD-10-CM

## 2023-05-08 ENCOUNTER — Encounter: Payer: Self-pay | Admitting: Internal Medicine

## 2023-05-08 ENCOUNTER — Ambulatory Visit (INDEPENDENT_AMBULATORY_CARE_PROVIDER_SITE_OTHER): Payer: Medicare HMO | Admitting: Internal Medicine

## 2023-05-08 VITALS — BP 115/68 | HR 74 | Ht 62.0 in | Wt 135.0 lb

## 2023-05-08 DIAGNOSIS — J4520 Mild intermittent asthma, uncomplicated: Secondary | ICD-10-CM

## 2023-05-08 DIAGNOSIS — E039 Hypothyroidism, unspecified: Secondary | ICD-10-CM

## 2023-05-08 DIAGNOSIS — I10 Essential (primary) hypertension: Secondary | ICD-10-CM

## 2023-05-08 DIAGNOSIS — E782 Mixed hyperlipidemia: Secondary | ICD-10-CM

## 2023-05-08 DIAGNOSIS — Z20822 Contact with and (suspected) exposure to covid-19: Secondary | ICD-10-CM

## 2023-05-08 DIAGNOSIS — R7303 Prediabetes: Secondary | ICD-10-CM | POA: Diagnosis not present

## 2023-05-08 DIAGNOSIS — N1831 Chronic kidney disease, stage 3a: Secondary | ICD-10-CM

## 2023-05-08 DIAGNOSIS — E559 Vitamin D deficiency, unspecified: Secondary | ICD-10-CM | POA: Diagnosis not present

## 2023-05-08 DIAGNOSIS — Z0001 Encounter for general adult medical examination with abnormal findings: Secondary | ICD-10-CM | POA: Diagnosis not present

## 2023-05-08 DIAGNOSIS — G2581 Restless legs syndrome: Secondary | ICD-10-CM

## 2023-05-08 DIAGNOSIS — F3341 Major depressive disorder, recurrent, in partial remission: Secondary | ICD-10-CM | POA: Diagnosis not present

## 2023-05-08 MED ORDER — ALBUTEROL SULFATE HFA 108 (90 BASE) MCG/ACT IN AERS
2.0000 | INHALATION_SPRAY | Freq: Four times a day (QID) | RESPIRATORY_TRACT | 2 refills | Status: DC | PRN
Start: 2023-05-08 — End: 2023-05-14

## 2023-05-08 NOTE — Assessment & Plan Note (Signed)
BP Readings from Last 1 Encounters:  04/18/2023 115/68   Overall well-controlled with Amlodipine 10 mg QD Counseled for compliance with the medications Advised DASH diet

## 2023-05-08 NOTE — Assessment & Plan Note (Signed)
Physical exam as documented. Fasting blood tests today. Advised to take Shingrix and Tdap vaccines at local pharmacy.

## 2023-05-08 NOTE — Assessment & Plan Note (Signed)
Check CMP - needs to improve hydration Avoid nephrotoxic agents 

## 2023-05-08 NOTE — Assessment & Plan Note (Addendum)
Flowsheet Row Office Visit from 04/17/2023 in [facility]  PHQ-9 Total Score 3      Also reports insomnia and decreased appetite - now better, on Remeron 7.5 mg QD - needs to be compliant Check TSH and free T4

## 2023-05-08 NOTE — Assessment & Plan Note (Signed)
Lab Results  Component Value Date   HGBA1C 6.3 02/19/2023   Advised to follow low-carb diet

## 2023-05-08 NOTE — Assessment & Plan Note (Signed)
Well-controlled with PRN Albuterol 

## 2023-05-08 NOTE — Assessment & Plan Note (Signed)
Has fatigue and other URTI symptoms Check COVID RT-PCR Mucinex or Robitussin as needed for cough Albuterol as needed for dyspnea or chest tightness or wheezing

## 2023-05-08 NOTE — Assessment & Plan Note (Signed)
Lab Results  Component Value Date   TSH 4.310 10/09/2022   On Levothyroxine 75 mcg QD, but needs to be compliant Will check TSH and free T4

## 2023-05-08 NOTE — Assessment & Plan Note (Signed)
Continue Ropinirole, refilled

## 2023-05-08 NOTE — Progress Notes (Signed)
Established Patient Office Visit  Subjective:  Patient ID: Elizabeth Mcintyre, female    DOB: 08-16-1941  Age: 82 y.o. MRN: [id number]  CC:  Chief Complaint  Patient presents with   Annual Exam    HPI [name] is a 82 y.o. 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  HTN: BP is well-controlled. Takes medications regularly. Patient denies headache, dizziness, chest pain, dyspnea or palpitations.   Hypothyroidism: Her TSH was wnl in the last visit.  Of note, she has started taking levothyroxine regularly now.  Her daughter is helping her to take medicines regularly now.   Alzheimer's dementia: She has had episodes of confusion and insomnia.  She also has been feeling down, especially on cold and cloudy days.  She was prescribed Remeron, and reports taking it regularly now.  She is on Aricept for dementia, followed by neurology.  URTI: She reports nasal congestion, cough, chest tightness and fatigue since this morning.  She recently went to vacation with her family and reports that everyone except her tested positive for COVID.  She has not tested herself.    Past Medical History:  Diagnosis Date   Acquired hypothyroidism 05/30/2014   Acute cystitis without hematuria 10/09/2022   Acute pain of right shoulder 04/16/2022   Asthma    Bloating 04/26/2019   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   Cancer 2021   Lymphoma   Carotid artery stenosis 06/13/2016   Chronic SI joint pain    was on tramadol   Diverticulosis 03/2011   Dysphagia 04/18/2020   Essential hypertension, benign 11/09/2007   Current med losartan 100 mg qd, amlodopine 10 mg qd   Fall 07/22/2021   Fecal incontinence 07/07/2019   Gastroesophageal reflux disease with esophagitis 03/06/2015   Gout 06/09/2014   R great toe, ball of foot  Stopped HCTZ several mos ago,  On losartan        Hematemesis 03/30/2020   Hepatic steatosis 01/04/2020   Herpes zoster without complication 12/02/2022   History of rheumatoid arthritis    during 30's, was treated.   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    Stress incontinence    Thrombocytopenia 01/04/2020   Type II diabetes mellitus 11/09/2007   diet control   Vitamin D deficiency 05/25/2015    Past Surgical History:  Procedure Laterality Date   ABDOMINAL HYSTERECTOMY     BALLOON DILATION N/A 08/21/2020   Procedure: BALLOON DILATION;  Surgeon: Lanelle Bal, DO;  Location: [facility];  Service: Endoscopy;  Laterality: N/A;   BIOPSY  03/30/2020   Procedure: BIOPSY;  Surgeon: Matthias Hughs,  Molly Maduro, MD;  Location: [facility];  Service: Endoscopy;;   BIOPSY  05/02/2020   Procedure: BIOPSY;  Surgeon: Dolores Frame, MD;  Location: [facility];  Service: Gastroenterology;;   CARDIAC CATHETERIZATION     X 2, last one in 1998   CHOLECYSTECTOMY N/A 04/13/2017   Procedure: LAPAROSCOPIC CHOLECYSTECTOMY;  Surgeon: Franky Macho, MD;  Location: [facility];  Service: General;  Laterality: N/A;   COLONOSCOPY     COLONOSCOPY  May 2012   Dr. Juanda Chance: mild  diverticulosis, otherwise normal.    COLONOSCOPY WITH PROPOFOL N/A 05/02/2020   Dr. Levon Hedger: 8 mm polyp removed from the ascending colon, 2 mm polyp removed from the ascending colon.  Tubular adenomas.  Diverticulosis.  Mucosal ulceration in the sigmoid colon noted, pathology consistent with ischemic colitis.   ESOPHAGOGASTRODUODENOSCOPY N/A 01/28/2015   Dr. Jena Gauss: reflux esophagitis, Schatzki's ring not manipulated due to recent bleeding   ESOPHAGOGASTRODUODENOSCOPY N/A 03/30/2015   Dr. Jena Gauss: Schatzki's ring s/p Elease Hashimoto dilation, previously noted esophageal ulcer completely healed   ESOPHAGOGASTRODUODENOSCOPY N/A 03/30/2020   Buccini: Moderately severe erosive, circumferential, confluent esophagitis with no bleeding found 25 to 40 cm from incisors.  Nonobstructing and mild Schatzki ring, there were also multiple distal esophageal rings noted, minimal hiatal hernia.   ESOPHAGOGASTRODUODENOSCOPY (EGD) WITH PROPOFOL N/A 08/21/2020   Procedure: ESOPHAGOGASTRODUODENOSCOPY (EGD) WITH PROPOFOL;  Surgeon: Lanelle Bal, DO;  Location: [facility];  Service: Endoscopy;  Laterality: N/A;  2:15pm   IR IMAGING GUIDED PORT INSERTION  03/13/2020   IR REMOVAL TUN ACCESS W/ PORT W/O FL MOD SED  10/01/2021   LYMPH NODE BIOPSY Left 03/20/2020   Procedure: LEFT POSTERIOR CERVICAL LYMPH NODE BIOPSY;  Surgeon: Violeta Gelinas, MD;  Location: [facility];  Service: General;  Laterality: Left;   MALONEY DILATION N/A 03/30/2015   Procedure: Alvy Beal;  Surgeon: Corbin Ade, MD;  Location: [facility];  Service: Endoscopy;  Laterality: N/A;   POLYPECTOMY  05/02/2020   Procedure: POLYPECTOMY;  Surgeon: Marguerita Merles, Reuel Boom, MD;  Location: [facility];  Service: Gastroenterology;;    Family History  Problem Relation Age of Onset   Heart disease Mother    Osteoarthritis Mother    Sudden death Father    Single kidney Father    Other Father        h/o severe MVA injuries   Hyperlipidemia Sister    Other  Daughter        Myalgias   Fibromyalgia Daughter    Allergies Daughter    Heart disease Maternal Grandfather    Sudden death Paternal Grandmother    Diabetes Paternal Grandfather    Heart disease Daughter    Other Daughter        palpitations   Pulmonary fibrosis Maternal Aunt    Cancer Paternal Uncle    Pulmonary fibrosis Maternal Aunt    Colon cancer Neg Hx     Social History   Socioeconomic History   Marital status: Widowed    Spouse name: Not on file   Number of children: 2   Years of education: 12   Highest education level: High school graduate  Occupational History   Occupation: Retired    Comment: Print production planner  Tobacco Use   Smoking status: Never   Smokeless tobacco: Never  Vaping Use   Vaping status: Never Used  Substance and Sexual Activity   Alcohol use: No   Drug use: No   Sexual activity: Never  Other Topics Concern  Not on file  Social History Narrative   Ms. Goodnow is widowed. Her young grandson lives with her, for whom she shares custody with her daughter, the son's aunt.     Right handed    Social Determinants of Health   Financial Resource Strain: Low Risk  (06/04/2021)   Overall Financial Resource Strain (CARDIA)    Difficulty of Paying Living Expenses: Not hard at all  Food Insecurity: No Food Insecurity (06/04/2021)   Hunger Vital Sign    Worried About Running Out of Food in the Last Year: Never true    Ran Out of Food in the Last Year: Never true  Transportation Needs: No Transportation Needs (06/04/2021)   PRAPARE - Administrator, Civil Service (Medical): No    Lack of Transportation (Non-Medical): No  Physical Activity: Insufficiently Active (06/04/2021)   Exercise Vital Sign    Days of Exercise per Week: 5 days    Minutes of Exercise per Session: 10 min  Stress: No Stress Concern Present (06/04/2021)   [employer] of Occupational Health - Occupational Stress Questionnaire    Feeling of Stress : Only a little   Social Connections: Socially Isolated (06/04/2021)   Social Connection and Isolation Panel [NHANES]    Frequency of Communication with Friends and Family: More than three times a week    Frequency of Social Gatherings with Friends and Family: More than three times a week    Attends Religious Services: Never    Database administrator or Organizations: No    Attends Banker Meetings: Never    Marital Status: Widowed  Intimate Partner Violence: Not At Risk (06/04/2021)   Humiliation, Afraid, Rape, and Kick questionnaire    Fear of Current or Ex-Partner: No    Emotionally Abused: No    Physically Abused: No    Sexually Abused: No    Outpatient Medications Prior to Visit  Medication Sig Dispense Refill   acetaminophen (TYLENOL) 325 MG tablet Take 325 mg by mouth every 6 (six) hours as needed for moderate pain.      amLODipine (NORVASC) 10 MG tablet TAKE 1 TABLET(10 MG) BY MOUTH DAILY 90 tablet 0   Ascorbic Acid (VITAMIN C) 100 MG tablet Take 100 mg by mouth daily.     cetirizine (ZYRTEC) 10 MG tablet Take 10 mg by mouth at bedtime. As needed     Cholecalciferol (VITAMIN D3) 25 MCG (1000 UT) CAPS Take 1,000 Units by mouth daily.      donepezil (ARICEPT) 10 MG tablet TAKE 1 TABLET BY MOUTH DAILY 30 tablet 0   gabapentin (NEURONTIN) 100 MG capsule Take 1 capsule (100 mg total) by mouth at bedtime. 30 capsule 2   levothyroxine (SYNTHROID) 75 MCG tablet TAKE 1 TABLET(75 MCG) BY MOUTH DAILY 30 tablet 3   lidocaine-prilocaine (EMLA) cream Apply 1 application topically as needed. (Patient taking differently: Apply 1 application  topically as needed (port access).) 30 g 1   mirtazapine (REMERON) 7.5 MG tablet TAKE 1 TABLET(7.5 MG) BY MOUTH AT BEDTIME 30 tablet 5   pantoprazole (PROTONIX) 40 MG tablet TAKE 1 TABLET(40 MG) BY MOUTH DAILY 90 tablet 3   rOPINIRole (REQUIP) 0.25 MG tablet TAKE 1 TABLET(0.25 MG) BY MOUTH AT BEDTIME 90 tablet 1   rosuvastatin (CRESTOR) 20 MG tablet TAKE 1  TABLET(20 MG) BY MOUTH DAILY 90 tablet 3   sucralfate (CARAFATE) 1 g tablet TAKE 1 TABLET(1 GRAM) BY MOUTH FOUR TIMES DAILY- AT BEDTIME WITH MEALS AS  NEEDED 120 tablet 0   tiZANidine (ZANAFLEX) 4 MG tablet Take 1 tablet (4 mg total) by mouth at bedtime as needed for muscle spasms. Do not take with alcohol or while driving or operating heavy machinery 30 tablet 0   vitamin B-12 (CYANOCOBALAMIN) 1000 MCG tablet Take 1,000 mcg by mouth daily.      albuterol (VENTOLIN HFA) 108 (90 Base) MCG/ACT inhaler Inhale 2 puffs into the lungs every 6 (six) hours as needed for wheezing. 8.5 g 2   No facility-administered medications prior to visit.    Allergies  Allergen Reactions   Codeine Phosphate Nausea And Vomiting and Rash    ROS Review of Systems  Constitutional:  Positive for fatigue. Negative for chills and fever.  HENT:  Positive for congestion and sinus pressure. Negative for sore throat.   Eyes:  Positive for visual disturbance. Negative for pain and discharge.  Respiratory:  Positive for cough. Negative for shortness of breath.   Cardiovascular:  Negative for chest pain and palpitations.  Gastrointestinal:  Positive for abdominal pain. Negative for nausea and vomiting.  Endocrine: Negative for polydipsia and polyuria.  Genitourinary:  Negative for dysuria and hematuria.  Musculoskeletal:  Positive for arthralgias (Right shoulder). Negative for neck pain and neck stiffness.  Skin:  Negative for rash.  Neurological:  Positive for headaches. Negative for weakness.  Psychiatric/Behavioral:  Positive for dysphoric mood and sleep disturbance. Negative for agitation and behavioral problems.       Objective:    Physical Exam Vitals reviewed.  Constitutional:      General: She is not in acute distress.    Appearance: She is not diaphoretic.  HENT:     Head: Normocephalic and atraumatic.     Nose: Congestion present.     Mouth/Throat:     Mouth: Mucous membranes are moist.  Eyes:      General: No scleral icterus.    Extraocular Movements: Extraocular movements intact.  Cardiovascular:     Rate and Rhythm: Normal rate and regular rhythm.     Pulses: Normal pulses.     Heart sounds: Normal heart sounds. No murmur heard. Pulmonary:     Breath sounds: Normal breath sounds. No wheezing or rales.  Abdominal:     General: Bowel sounds are normal.     Palpations: Abdomen is soft.     Tenderness: There is abdominal tenderness (Mild, epigastric).  Musculoskeletal:     Cervical back: Neck supple. No tenderness.     Right lower leg: No edema.     Left lower leg: No edema.     Comments: ROM at right shoulder limited due to pain  Skin:    General: Skin is warm.     Findings: No rash.  Neurological:     General: No focal deficit present.     Mental Status: She is alert and oriented to person, place, and time.     Sensory: No sensory deficit.     Motor: Weakness (B/l LE) present.  Psychiatric:        Mood and Affect: Mood normal.        Behavior: Behavior normal.     BP 115/68   Pulse 74   Ht 5\' 2"  (1.575 m)   Wt 135 lb (61.2 kg)   SpO2 93%   BMI 24.69 kg/m  Wt Readings from Last 3 Encounters:  04/27/2023 135 lb (61.2 kg)  04/17/23 141 lb (64 kg)  02/19/23 146 lb 6.4 oz (66.4 kg)  Lab Results  Component Value Date   TSH 4.310 10/09/2022   Lab Results  Component Value Date   WBC 10.9 (H) 02/06/2023   HGB 13.5 02/06/2023   HCT 39.1 02/06/2023   MCV 86.5 02/06/2023   PLT 245 02/06/2023   Lab Results  Component Value Date   NA 141 02/06/2023   K 3.9 02/06/2023   CO2 27 02/06/2023   GLUCOSE 181 (H) 02/06/2023   BUN 18 02/06/2023   CREATININE 1.28 (H) 02/06/2023   BILITOT 0.4 02/06/2023   ALKPHOS 68 02/06/2023   AST 24 02/06/2023   ALT 25 02/06/2023   PROT 6.8 02/06/2023   ALBUMIN 4.5 02/06/2023   CALCIUM 9.8 02/06/2023   ANIONGAP 10 02/06/2023   EGFR 38 (L) 01/22/2023   GFR 33.87 (L) 12/29/2019   Lab Results  Component Value Date   CHOL 303  (H) 05/05/2022   Lab Results  Component Value Date   HDL 60 05/05/2022   Lab Results  Component Value Date   LDLCALC 182 (H) 05/05/2022   Lab Results  Component Value Date   TRIG 315 (H) 05/05/2022   Lab Results  Component Value Date   CHOLHDL 5.1 (H) 05/05/2022   Lab Results  Component Value Date   HGBA1C 6.3 02/19/2023      Assessment & Plan:   Problem List Items Addressed This Visit       Cardiovascular and Mediastinum   Essential hypertension, benign    BP Readings from Last 1 Encounters:  04/25/2023 115/68   Overall well-controlled with Amlodipine 10 mg QD Counseled for compliance with the medications Advised DASH diet        Respiratory   Mild intermittent asthma    Well-controlled with PRN Albuterol      Relevant Medications   albuterol (VENTOLIN HFA) 108 (90 Base) MCG/ACT inhaler     Endocrine   Acquired hypothyroidism    Lab Results  Component Value Date   TSH 4.310 10/09/2022   On Levothyroxine 75 mcg QD, but needs to be compliant Will check TSH and free T4      Relevant Orders   TSH + free T4     Genitourinary   Stage 3a chronic kidney disease    Check CMP - needs to improve hydration Avoid nephrotoxic agents      Relevant Orders   CMP14+EGFR   CBC with Differential/Platelet   Urine Microalbumin w/creat. ratio     Other   Major depressive disorder    Flowsheet Row Office Visit from 04/23/2023 in [facility]  PHQ-9 Total Score 3      Also reports insomnia and decreased appetite - now better, on Remeron 7.5 mg QD - needs to be compliant Check TSH and free T4      Vitamin D deficiency   Relevant Orders   VITAMIN D 25 Hydroxy (Vit-D Deficiency, Fractures)   Mixed hyperlipidemia    On Crestor      Relevant Orders   Lipid panel   Restless legs syndrome    Continue Ropinirole, refilled      Encounter for general adult medical examination with abnormal findings - Primary    Physical exam as  documented. Fasting blood tests today. Advised to take Shingrix and Tdap vaccines at local pharmacy.      Prediabetes    Lab Results  Component Value Date   HGBA1C 6.3 02/19/2023   Advised to follow low-carb diet      Relevant Orders  Hemoglobin A1c   Suspected COVID-19 virus infection    Has fatigue and other URTI symptoms Check COVID RT-PCR Mucinex or Robitussin as needed for cough Albuterol as needed for dyspnea or chest tightness or wheezing       Relevant Orders   Novel Coronavirus, NAA ([facility])    Meds ordered this encounter  Medications   albuterol (VENTOLIN HFA) 108 (90 Base) MCG/ACT inhaler    Sig: Inhale 2 puffs into the lungs every 6 (six) hours as needed for wheezing.    Dispense:  18 g    Refill:  2    Follow-up: Return in about 4 months (around 09/07/2023) for HTN and MDD.    Anabel Halon, MD

## 2023-05-08 NOTE — Patient Instructions (Signed)
Please take Levothyroxine in the morning on empty stomach and do not take any other medicine for 1 hour.  Please continue to take medications as prescribed.  Please continue to follow low carb diet and perform moderate exercise/walking at least 150 mins/week.  Please get fasting blood tests done after 2 weeks.  Please perform home COVID test and call us with results.

## 2023-05-08 NOTE — Assessment & Plan Note (Signed)
On Crestor 

## 2023-05-10 LAB — NOVEL CORONAVIRUS, NAA: SARS-CoV-2, NAA: DETECTED — AB

## 2023-05-11 ENCOUNTER — Emergency Department (HOSPITAL_COMMUNITY): Payer: Medicare HMO

## 2023-05-11 ENCOUNTER — Other Ambulatory Visit: Payer: Self-pay | Admitting: Internal Medicine

## 2023-05-11 ENCOUNTER — Encounter (HOSPITAL_COMMUNITY): Payer: Self-pay | Admitting: Emergency Medicine

## 2023-05-11 ENCOUNTER — Other Ambulatory Visit: Payer: Self-pay

## 2023-05-11 ENCOUNTER — Emergency Department (HOSPITAL_COMMUNITY)
Admission: EM | Admit: 2023-05-11 | Discharge: 2023-05-11 | Disposition: A | Discharge status: Home / Self Care | Payer: Medicare HMO | Attending: Emergency Medicine | Admitting: Emergency Medicine

## 2023-05-11 DIAGNOSIS — I129 Hypertensive chronic kidney disease with stage 1 through stage 4 chronic kidney disease, or unspecified chronic kidney disease: Secondary | ICD-10-CM | POA: Insufficient documentation

## 2023-05-11 DIAGNOSIS — N189 Chronic kidney disease, unspecified: Secondary | ICD-10-CM | POA: Diagnosis not present

## 2023-05-11 DIAGNOSIS — R109 Unspecified abdominal pain: Secondary | ICD-10-CM | POA: Diagnosis not present

## 2023-05-11 DIAGNOSIS — R531 Weakness: Secondary | ICD-10-CM | POA: Diagnosis not present

## 2023-05-11 DIAGNOSIS — E1122 Type 2 diabetes mellitus with diabetic chronic kidney disease: Secondary | ICD-10-CM | POA: Insufficient documentation

## 2023-05-11 DIAGNOSIS — Z7951 Long term (current) use of inhaled steroids: Secondary | ICD-10-CM | POA: Insufficient documentation

## 2023-05-11 DIAGNOSIS — J45909 Unspecified asthma, uncomplicated: Secondary | ICD-10-CM | POA: Diagnosis not present

## 2023-05-11 DIAGNOSIS — E860 Dehydration: Secondary | ICD-10-CM

## 2023-05-11 DIAGNOSIS — U071 COVID-19: Secondary | ICD-10-CM

## 2023-05-11 DIAGNOSIS — R1084 Generalized abdominal pain: Secondary | ICD-10-CM | POA: Diagnosis not present

## 2023-05-11 DIAGNOSIS — N281 Cyst of kidney, acquired: Secondary | ICD-10-CM | POA: Diagnosis not present

## 2023-05-11 DIAGNOSIS — Z79899 Other long term (current) drug therapy: Secondary | ICD-10-CM | POA: Diagnosis not present

## 2023-05-11 DIAGNOSIS — I700 Atherosclerosis of aorta: Secondary | ICD-10-CM | POA: Diagnosis not present

## 2023-05-11 DIAGNOSIS — K429 Umbilical hernia without obstruction or gangrene: Secondary | ICD-10-CM | POA: Diagnosis not present

## 2023-05-11 DIAGNOSIS — R059 Cough, unspecified: Secondary | ICD-10-CM | POA: Diagnosis not present

## 2023-05-11 DIAGNOSIS — K5730 Diverticulosis of large intestine without perforation or abscess without bleeding: Secondary | ICD-10-CM | POA: Diagnosis not present

## 2023-05-11 DIAGNOSIS — R110 Nausea: Secondary | ICD-10-CM | POA: Insufficient documentation

## 2023-05-11 DIAGNOSIS — R519 Headache, unspecified: Secondary | ICD-10-CM | POA: Diagnosis present

## 2023-05-11 LAB — CBC WITH DIFFERENTIAL/PLATELET
Abs Immature Granulocytes: 0.05 10*3/uL (ref 0.00–0.07)
Basophils Absolute: 0 10*3/uL (ref 0.0–0.1)
Basophils Relative: 0 %
Eosinophils Absolute: 0.2 10*3/uL (ref 0.0–0.5)
Eosinophils Relative: 2 %
HCT: 41.5 % (ref 36.0–46.0)
Hemoglobin: 14.2 g/dL (ref 12.0–15.0)
Immature Granulocytes: 1 %
Lymphocytes Relative: 16 %
Lymphs Abs: 1.5 10*3/uL (ref 0.7–4.0)
MCH: 28.5 pg (ref 26.0–34.0)
MCHC: 34.2 g/dL (ref 30.0–36.0)
MCV: 83.2 fL (ref 80.0–100.0)
Monocytes Absolute: 0.8 10*3/uL (ref 0.1–1.0)
Monocytes Relative: 9 %
Neutro Abs: 6.4 10*3/uL (ref 1.7–7.7)
Neutrophils Relative %: 72 %
Platelets: 218 10*3/uL (ref 150–400)
RBC: 4.99 MIL/uL (ref 3.87–5.11)
RDW: 13 % (ref 11.5–15.5)
WBC: 8.9 10*3/uL (ref 4.0–10.5)
nRBC: 0 % (ref 0.0–0.2)

## 2023-05-11 LAB — COMPREHENSIVE METABOLIC PANEL
ALT: 21 U/L (ref 0–44)
AST: 23 U/L (ref 15–41)
Albumin: 4 g/dL (ref 3.5–5.0)
Alkaline Phosphatase: 74 U/L (ref 38–126)
Anion gap: 13 (ref 5–15)
BUN: 15 mg/dL (ref 8–23)
CO2: 23 mmol/L (ref 22–32)
Calcium: 9.6 mg/dL (ref 8.9–10.3)
Chloride: 104 mmol/L (ref 98–111)
Creatinine, Ser: 1.5 mg/dL — ABNORMAL HIGH (ref 0.44–1.00)
GFR, Estimated: 35 mL/min — ABNORMAL LOW (ref >60)
Glucose, Bld: 151 mg/dL — ABNORMAL HIGH (ref 70–99)
Potassium: 3.5 mmol/L (ref 3.5–5.1)
Sodium: 140 mmol/L (ref 135–145)
Total Bilirubin: 0.8 mg/dL (ref 0.3–1.2)
Total Protein: 6.4 g/dL — ABNORMAL LOW (ref 6.5–8.1)

## 2023-05-11 LAB — MAGNESIUM: Magnesium: 1.8 mg/dL (ref 1.7–2.4)

## 2023-05-11 LAB — LIPASE, BLOOD: Lipase: 37 U/L (ref 11–51)

## 2023-05-11 LAB — LACTIC ACID, PLASMA
Lactic Acid, Venous: 2.6 mmol/L (ref 0.5–1.9)
Lactic Acid, Venous: 1.8 mmol/L (ref 0.5–1.9)

## 2023-05-11 MED ORDER — LACTATED RINGERS IV BOLUS
1000.0000 mL | Freq: Once | INTRAVENOUS | Status: AC
  Administered 2023-05-11: 1000 mL via INTRAVENOUS

## 2023-05-11 MED ORDER — FENTANYL CITRATE PF 50 MCG/ML IJ SOSY
50.0000 ug | PREFILLED_SYRINGE | Freq: Once | INTRAMUSCULAR | Status: AC
  Administered 2023-05-11: 50 ug via INTRAVENOUS
  Filled 2023-05-11: qty 1

## 2023-05-11 MED ORDER — ONDANSETRON HCL 4 MG/2ML IJ SOLN
4.0000 mg | Freq: Once | INTRAMUSCULAR | Status: AC
  Administered 2023-05-11: 4 mg via INTRAVENOUS
  Filled 2023-05-11: qty 2

## 2023-05-11 MED ORDER — NIRMATRELVIR/RITONAVIR (PAXLOVID) TABLET (RENAL DOSING)
2.0000 | ORAL_TABLET | Freq: Two times a day (BID) | ORAL | 0 refills | Status: DC
Start: 2023-05-11 — End: 2023-05-14

## 2023-05-11 MED ORDER — IOHEXOL 350 MG/ML SOLN
60.0000 mL | Freq: Once | INTRAVENOUS | Status: AC | PRN
  Administered 2023-05-11: 60 mL via INTRAVENOUS

## 2023-05-11 NOTE — Discharge Instructions (Signed)
Chest x-ray and CT imaging was negative.  You have symptoms likely of COVID-19.  Continue to orally rehydrate outpatient, take Tylenol and ibuprofen for pain control, follow-up with your PCP

## 2023-05-11 NOTE — ED Triage Notes (Signed)
Pt came to the emergency room after having chills, headache, diarrhea, nausea and chest pressure for 2 days. Patient stated she has been getting weaker and been having dizziness.Today she was having problem walking at home, because of the weakness.She report no diarrhea, but continue to have the other symptoms. Patient is A & O x 4.

## 2023-05-11 NOTE — ED Notes (Signed)
Patient ambulated well in hallway. Pulse Ox was 95 when ambulating. No complaints of lightheadedness.

## 2023-05-11 NOTE — ED Provider Notes (Signed)
[place] EMERGENCY DEPARTMENT AT [facility] Provider Note   CSN: [id number] Arrival date & time: 05/11/23  4098     History  No chief complaint on file.   Elizabeth Mcintyre is a 82 y.o. female.  HPI   82 year old female with medical history significant for asthma, diverticulosis, DM2, IBS, lower GI bleeding, gout, CAD, HTN, CKD presenting to the emergency department with generalized bodyaches and chills.  The patient has been having chills, headache, diarrhea, nausea with associated chest pressure for the last 2 days.  She endorses a dry cough.  She tested positive for COVID-19 3 days ago.  She was having difficulty ambulating today because of generalized weakness.  She endorses generalized abdominal discomfort.  No vomiting.  She is passing gas.  Home Medications Prior to Admission medications   Medication Sig Start Date End Date Taking? Authorizing Provider  acetaminophen (TYLENOL) 325 MG tablet Take 325 mg by mouth every 6 (six) hours as needed for moderate pain.     [provider]  albuterol (VENTOLIN HFA) 108 (90 Base) MCG/ACT inhaler Inhale 2 puffs into the lungs every 6 (six) hours as needed for wheezing. 04/20/2023   Anabel Halon, MD  amLODipine (NORVASC) 10 MG tablet TAKE 1 TABLET(10 MG) BY MOUTH DAILY 04/27/23   Anabel Halon, MD  Ascorbic Acid (VITAMIN C) 100 MG tablet Take 100 mg by mouth daily.    [provider]  cetirizine (ZYRTEC) 10 MG tablet Take 10 mg by mouth at bedtime. As needed    [provider]  Cholecalciferol (VITAMIN D3) 25 MCG (1000 UT) CAPS Take 1,000 Units by mouth daily.     [provider]  donepezil (ARICEPT) 10 MG tablet TAKE 1 TABLET BY MOUTH DAILY 03/27/23   Marcos Eke, PA-C  gabapentin (NEURONTIN) 100 MG capsule Take 1 capsule (100 mg total) by mouth at bedtime. 02/19/23   Anabel Halon, MD  levothyroxine (SYNTHROID) 75 MCG tablet TAKE 1 TABLET(75 MCG) BY MOUTH DAILY 03/27/23   Anabel Halon, MD   lidocaine-prilocaine (EMLA) cream Apply 1 application topically as needed. Patient taking differently: Apply 1 application  topically as needed (port access). 04/12/20   Jaci Standard, MD  mirtazapine (REMERON) 7.5 MG tablet TAKE 1 TABLET(7.5 MG) BY MOUTH AT BEDTIME 02/25/23   Anabel Halon, MD  nirmatrelvir/ritonavir, renal dosing, (PAXLOVID) 10 x 150 MG & 10 x 100MG  TABS Take 2 tablets by mouth 2 (two) times daily for 5 days. (Take nirmatrelvir 150 mg one tablet twice daily for 5 days and ritonavir 100 mg one tablet twice daily for 5 days) Patient GFR is 42. 05/11/23 05/16/23  Anabel Halon, MD  pantoprazole (PROTONIX) 40 MG tablet TAKE 1 TABLET(40 MG) BY MOUTH DAILY 04/28/22   Lanelle Bal, DO  rOPINIRole (REQUIP) 0.25 MG tablet TAKE 1 TABLET(0.25 MG) BY MOUTH AT BEDTIME 04/27/23   Anabel Halon, MD  rosuvastatin (CRESTOR) 20 MG tablet TAKE 1 TABLET(20 MG) BY MOUTH DAILY 04/27/23   Anabel Halon, MD  sucralfate (CARAFATE) 1 g tablet TAKE 1 TABLET(1 GRAM) BY MOUTH FOUR TIMES DAILY- AT BEDTIME WITH MEALS AS NEEDED 03/27/23   Anabel Halon, MD  tiZANidine (ZANAFLEX) 4 MG tablet Take 1 tablet (4 mg total) by mouth at bedtime as needed for muscle spasms. Do not take with alcohol or while driving or operating heavy machinery 04/11/22   Valentino Nose, NP  vitamin B-12 (CYANOCOBALAMIN) 1000 MCG tablet  Take 1,000 mcg by mouth daily.     [provider]      Allergies    Codeine phosphate    Review of Systems   Review of Systems  All other systems reviewed and are negative.   Physical Exam Updated Vital Signs There were no vitals taken for this visit. Physical Exam Vitals and nursing note reviewed.  Constitutional:      General: She is not in acute distress.    Appearance: She is well-developed.  HENT:     Head: Normocephalic and atraumatic.  Eyes:     Conjunctiva/sclera: Conjunctivae normal.  Cardiovascular:     Rate and Rhythm: Normal rate and regular rhythm.   Pulmonary:     Effort: Pulmonary effort is normal. No respiratory distress.     Breath sounds: Normal breath sounds.  Abdominal:     Palpations: Abdomen is soft.     Tenderness: There is generalized abdominal tenderness.  Musculoskeletal:        General: No swelling.     Cervical back: Neck supple.  Skin:    General: Skin is warm and dry.     Capillary Refill: Capillary refill takes less than 2 seconds.  Neurological:     General: No focal deficit present.     Mental Status: She is alert and oriented to person, place, and time. Mental status is at baseline.     Cranial Nerves: No cranial nerve deficit.     Sensory: No sensory deficit.     Motor: No weakness.  Psychiatric:        Mood and Affect: Mood normal.     ED Results / Procedures / Treatments   Labs (all labs ordered are listed, but only abnormal results are displayed) Labs Reviewed - No data to display  EKG None  Radiology No results found.  Procedures Procedures    Medications Ordered in ED Medications - No data to display  ED Course/ Medical Decision Making/ A&P Clinical Course as of 05/11/23 1417  Mon May 11, 2023  1417 Lactic Acid, Venous(!!): 2.6 [JL]    Clinical Course User Index [JL] Ernie Avena, MD                                 Medical Decision Making Amount and/or Complexity of Data Reviewed Labs: ordered. Radiology: ordered.  Risk Prescription drug management.     82 year old female with medical history significant for asthma, diverticulosis, DM2, IBS, lower GI bleeding, gout, CAD, HTN, CKD presenting to the emergency department with generalized bodyaches and chills.  The patient has been having chills, headache, diarrhea, nausea with associated chest pressure for the last 2 days.  She endorses a dry cough.  She tested positive for COVID-19 3 days ago.  She was having difficulty ambulating today because of generalized weakness.  She endorses generalized abdominal discomfort.  No  vomiting.  She is passing gas.    On arrival, the patient was afebrile, not tachycardic or tachypneic, hemodynamically stable, saturating under percent on room air.  Sinus rhythm noted on cardiac telemetry.  Physical exam significant for clear lungs bilaterally, generalized abdominal tenderness to palpation.  Patient did test positive for COVID-19 3 days ago and may be experiencing worsening symptoms of COVID-19 however given her generalized abdominal tenderness, considered bowel obstruction, other acute intra-abdominal emergency.  Consider developing superimposed bacterial pneumonia.  Consider dehydration causing some of the patient's symptoms.  Laboratory  evaluation revealed lactic acid of 2.6, lipase normal, magnesium normal, CBC without a leukocytosis or anemia, CMP with hyperglycemia to 151, serum creatinine of 1.5, most of the patient's baseline of 1.3.  Repeat lactic acid ***.  CXR: IMPRESSION:  1. No active disease. Aortic atherosclerosis.    CT Abdomen Pelvis: ***  {Document critical care time when appropriate:1} {Document review of labs and clinical decision tools ie heart score, Chads2Vasc2 etc:1}  {Document your independent review of radiology images, and any outside records:1} {Document your discussion with family members, caretakers, and with consultants:1} {Document social determinants of health affecting pt's care:1} {Document your decision making why or why not admission, treatments were needed:1} Final Clinical Impression(s) / ED Diagnoses Final diagnoses:  None    Rx / DC Orders ED Discharge Orders     None

## 2023-05-17 ENCOUNTER — Emergency Department (HOSPITAL_COMMUNITY): Payer: Medicare HMO

## 2023-05-17 ENCOUNTER — Inpatient Hospital Stay (HOSPITAL_COMMUNITY)
Admission: EM | Admit: 2023-05-17 | Discharge: 2023-05-20 | DRG: 683 | Disposition: A | Discharge status: Home Health | Payer: Medicare HMO | Attending: Family Medicine | Admitting: Family Medicine

## 2023-05-17 ENCOUNTER — Other Ambulatory Visit: Payer: Self-pay

## 2023-05-17 DIAGNOSIS — R079 Chest pain, unspecified: Secondary | ICD-10-CM

## 2023-05-17 DIAGNOSIS — R1114 Bilious vomiting: Principal | ICD-10-CM

## 2023-05-17 DIAGNOSIS — E860 Dehydration: Secondary | ICD-10-CM

## 2023-05-17 DIAGNOSIS — N390 Urinary tract infection, site not specified: Secondary | ICD-10-CM

## 2023-05-17 DIAGNOSIS — N179 Acute kidney failure, unspecified: Secondary | ICD-10-CM

## 2023-05-17 DIAGNOSIS — R55 Syncope and collapse: Secondary | ICD-10-CM

## 2023-05-17 DIAGNOSIS — R112 Nausea with vomiting, unspecified: Secondary | ICD-10-CM

## 2023-05-17 DIAGNOSIS — R531 Weakness: Secondary | ICD-10-CM

## 2023-05-17 DIAGNOSIS — R9431 Abnormal electrocardiogram [ECG] [EKG]: Secondary | ICD-10-CM

## 2023-05-17 DIAGNOSIS — R1084 Generalized abdominal pain: Secondary | ICD-10-CM

## 2023-05-17 DIAGNOSIS — R197 Diarrhea, unspecified: Secondary | ICD-10-CM

## 2023-05-17 DIAGNOSIS — E876 Hypokalemia: Secondary | ICD-10-CM

## 2023-05-17 DIAGNOSIS — E039 Hypothyroidism, unspecified: Secondary | ICD-10-CM

## 2023-05-17 DIAGNOSIS — K219 Gastro-esophageal reflux disease without esophagitis: Secondary | ICD-10-CM | POA: Diagnosis present

## 2023-05-17 DIAGNOSIS — F0283 Dementia in other diseases classified elsewhere, unspecified severity, with mood disturbance: Secondary | ICD-10-CM | POA: Diagnosis present

## 2023-05-17 DIAGNOSIS — R6889 Other general symptoms and signs: Secondary | ICD-10-CM | POA: Diagnosis not present

## 2023-05-17 DIAGNOSIS — E872 Acidosis, unspecified: Secondary | ICD-10-CM | POA: Diagnosis not present

## 2023-05-17 DIAGNOSIS — E1122 Type 2 diabetes mellitus with diabetic chronic kidney disease: Secondary | ICD-10-CM | POA: Diagnosis not present

## 2023-05-17 DIAGNOSIS — G2581 Restless legs syndrome: Secondary | ICD-10-CM | POA: Diagnosis present

## 2023-05-17 DIAGNOSIS — G309 Alzheimer's disease, unspecified: Secondary | ICD-10-CM | POA: Diagnosis present

## 2023-05-17 DIAGNOSIS — G8929 Other chronic pain: Secondary | ICD-10-CM | POA: Diagnosis present

## 2023-05-17 DIAGNOSIS — K760 Fatty (change of) liver, not elsewhere classified: Secondary | ICD-10-CM | POA: Diagnosis present

## 2023-05-17 DIAGNOSIS — C8580 Other specified types of non-Hodgkin lymphoma, unspecified site: Secondary | ICD-10-CM | POA: Diagnosis present

## 2023-05-17 DIAGNOSIS — R109 Unspecified abdominal pain: Secondary | ICD-10-CM | POA: Diagnosis not present

## 2023-05-17 DIAGNOSIS — E785 Hyperlipidemia, unspecified: Secondary | ICD-10-CM | POA: Diagnosis present

## 2023-05-17 DIAGNOSIS — E1140 Type 2 diabetes mellitus with diabetic neuropathy, unspecified: Secondary | ICD-10-CM | POA: Diagnosis present

## 2023-05-17 DIAGNOSIS — K5730 Diverticulosis of large intestine without perforation or abscess without bleeding: Secondary | ICD-10-CM | POA: Diagnosis not present

## 2023-05-17 DIAGNOSIS — N1831 Chronic kidney disease, stage 3a: Secondary | ICD-10-CM | POA: Diagnosis present

## 2023-05-17 DIAGNOSIS — U071 COVID-19: Secondary | ICD-10-CM | POA: Diagnosis not present

## 2023-05-17 DIAGNOSIS — R3915 Urgency of urination: Secondary | ICD-10-CM | POA: Diagnosis present

## 2023-05-17 DIAGNOSIS — Z8616 Personal history of COVID-19: Secondary | ICD-10-CM | POA: Diagnosis not present

## 2023-05-17 DIAGNOSIS — M109 Gout, unspecified: Secondary | ICD-10-CM | POA: Diagnosis present

## 2023-05-17 DIAGNOSIS — Z79890 Hormone replacement therapy (postmenopausal): Secondary | ICD-10-CM | POA: Diagnosis not present

## 2023-05-17 DIAGNOSIS — I129 Hypertensive chronic kidney disease with stage 1 through stage 4 chronic kidney disease, or unspecified chronic kidney disease: Secondary | ICD-10-CM | POA: Diagnosis present

## 2023-05-17 DIAGNOSIS — J45909 Unspecified asthma, uncomplicated: Secondary | ICD-10-CM | POA: Diagnosis present

## 2023-05-17 DIAGNOSIS — K580 Irritable bowel syndrome with diarrhea: Secondary | ICD-10-CM | POA: Diagnosis present

## 2023-05-17 DIAGNOSIS — Z885 Allergy status to narcotic agent status: Secondary | ICD-10-CM

## 2023-05-17 DIAGNOSIS — R630 Anorexia: Secondary | ICD-10-CM | POA: Diagnosis present

## 2023-05-17 DIAGNOSIS — T375X5A Adverse effect of antiviral drugs, initial encounter: Secondary | ICD-10-CM | POA: Diagnosis present

## 2023-05-17 DIAGNOSIS — Z6825 Body mass index (BMI) 25.0-25.9, adult: Secondary | ICD-10-CM

## 2023-05-17 DIAGNOSIS — I2510 Atherosclerotic heart disease of native coronary artery without angina pectoris: Secondary | ICD-10-CM | POA: Diagnosis present

## 2023-05-17 DIAGNOSIS — R0602 Shortness of breath: Secondary | ICD-10-CM | POA: Diagnosis not present

## 2023-05-17 DIAGNOSIS — Z79899 Other long term (current) drug therapy: Secondary | ICD-10-CM

## 2023-05-17 LAB — URINALYSIS, ROUTINE W REFLEX MICROSCOPIC
Bilirubin Urine: NEGATIVE
Glucose, UA: NEGATIVE mg/dL
Hgb urine dipstick: NEGATIVE
Ketones, ur: 20 mg/dL — AB
Nitrite: NEGATIVE
Protein, ur: NEGATIVE mg/dL
Specific Gravity, Urine: 1.005 (ref 1.005–1.030)
pH: 7 (ref 5.0–8.0)

## 2023-05-17 LAB — CBC WITH DIFFERENTIAL/PLATELET
Abs Immature Granulocytes: 0.04 K/uL (ref 0.00–0.07)
Basophils Absolute: 0.1 K/uL (ref 0.0–0.1)
Basophils Relative: 1 %
Eosinophils Absolute: 0.1 K/uL (ref 0.0–0.5)
Eosinophils Relative: 1 %
HCT: 42.8 % (ref 36.0–46.0)
Hemoglobin: 14.5 g/dL (ref 12.0–15.0)
Immature Granulocytes: 0 %
Lymphocytes Relative: 8 %
Lymphs Abs: 0.9 K/uL (ref 0.7–4.0)
MCH: 28.5 pg (ref 26.0–34.0)
MCHC: 33.9 g/dL (ref 30.0–36.0)
MCV: 84.1 fL (ref 80.0–100.0)
Monocytes Absolute: 0.7 K/uL (ref 0.1–1.0)
Monocytes Relative: 6 %
Neutro Abs: 9.2 K/uL — ABNORMAL HIGH (ref 1.7–7.7)
Neutrophils Relative %: 84 %
Platelets: 189 K/uL (ref 150–400)
RBC: 5.09 MIL/uL (ref 3.87–5.11)
RDW: 12.8 % (ref 11.5–15.5)
WBC: 11.1 K/uL — ABNORMAL HIGH (ref 4.0–10.5)
nRBC: 0 % (ref 0.0–0.2)

## 2023-05-17 LAB — I-STAT CG4 LACTIC ACID, ED
Lactic Acid, Venous: 2.3 mmol/L (ref 0.5–1.9)
Lactic Acid, Venous: 2.5 mmol/L (ref 0.5–1.9)

## 2023-05-17 LAB — CBG MONITORING, ED: Glucose-Capillary: 152 mg/dL — ABNORMAL HIGH (ref 70–99)

## 2023-05-17 LAB — COMPREHENSIVE METABOLIC PANEL
ALT: 40 U/L (ref 0–44)
AST: 50 U/L — ABNORMAL HIGH (ref 15–41)
Albumin: 4 g/dL (ref 3.5–5.0)
Alkaline Phosphatase: 99 U/L (ref 38–126)
Anion gap: 16 — ABNORMAL HIGH (ref 5–15)
BUN: 18 mg/dL (ref 8–23)
CO2: 20 mmol/L — ABNORMAL LOW (ref 22–32)
Calcium: 9.1 mg/dL (ref 8.9–10.3)
Chloride: 102 mmol/L (ref 98–111)
Creatinine, Ser: 1.72 mg/dL — ABNORMAL HIGH (ref 0.44–1.00)
GFR, Estimated: 30 mL/min — ABNORMAL LOW (ref >60)
Glucose, Bld: 158 mg/dL — ABNORMAL HIGH (ref 70–99)
Potassium: 3 mmol/L — ABNORMAL LOW (ref 3.5–5.1)
Sodium: 138 mmol/L (ref 135–145)
Total Bilirubin: 1 mg/dL (ref 0.3–1.2)
Total Protein: 6.3 g/dL — ABNORMAL LOW (ref 6.5–8.1)

## 2023-05-17 LAB — LIPASE, BLOOD: Lipase: 75 U/L — ABNORMAL HIGH (ref 11–51)

## 2023-05-17 LAB — TROPONIN I (HIGH SENSITIVITY)
Troponin I (High Sensitivity): 7 ng/L (ref <18)
Troponin I (High Sensitivity): 7 ng/L (ref <18)

## 2023-05-17 MED ORDER — IOHEXOL 350 MG/ML SOLN
75.0000 mL | Freq: Once | INTRAVENOUS | Status: AC | PRN
  Administered 2023-05-17: 60 mL via INTRAVENOUS

## 2023-05-17 MED ORDER — LACTATED RINGERS IV BOLUS
1000.0000 mL | Freq: Once | INTRAVENOUS | Status: AC
  Administered 2023-05-17: 1000 mL via INTRAVENOUS

## 2023-05-17 MED ORDER — ONDANSETRON HCL 4 MG/2ML IJ SOLN
4.0000 mg | Freq: Once | INTRAMUSCULAR | Status: AC
  Administered 2023-05-17: 4 mg via INTRAVENOUS
  Filled 2023-05-17: qty 2

## 2023-05-17 MED ORDER — SODIUM CHLORIDE 0.9 % IV SOLN
1.0000 g | Freq: Once | INTRAVENOUS | Status: AC
  Administered 2023-05-17: 1 g via INTRAVENOUS
  Filled 2023-05-17: qty 10

## 2023-05-17 NOTE — ED Provider Notes (Signed)
[place] EMERGENCY DEPARTMENT AT [facility] Provider Note   CSN: [id number] Arrival date & time: 05/17/23  1901     History  Chief Complaint  Patient presents with   covid/weakness    Elizabeth Mcintyre is a 82 y.o. female with past medical history significant for asthma, diverticulosis, DM2, IBS, lower GI bleeding, gout, CAD, HTN, CKD presents to the ED via EMS complaining of shortness of breath, chest pain, nausea, vomiting, diarrhea, abdominal pain.  Patient was diagnosed with COVID 1 week ago.  She has been unwell for the past 3 weeks per her daughter.  Patient has had severely reduced appetite.  She has not been able to keep anything down today.  Patient was seen in the ED for similar symptoms last week, but is not improving.  Patient reports chills and generalized body aches without known fever.        Home Medications Prior to Admission medications   Not on File      Allergies    Patient has no known allergies.    Review of Systems   Review of Systems  Constitutional:  Positive for appetite change (decreased), chills and fatigue. Negative for fever.  Respiratory:  Positive for shortness of breath.   Cardiovascular:  Positive for chest pain.  Gastrointestinal:  Positive for abdominal pain, diarrhea, nausea and vomiting.  Musculoskeletal:  Positive for myalgias.  Neurological:  Positive for weakness.    Physical Exam Updated Vital Signs BP (!) 127/114 (BP Location: Right Arm)   Temp 98 F (36.7 C) (Oral)   Resp 19   Ht 5\' 2"  (1.575 m)   Wt 64 kg   SpO2 100%   BMI 25.79 kg/m  Physical Exam Vitals and nursing note reviewed.  Constitutional:      General: She is not in acute distress.    Appearance: Normal appearance. She is ill-appearing. She is not diaphoretic.     Comments: Patient visibly shaking.  Cardiovascular:     Rate and Rhythm: Normal rate and regular rhythm.     Heart sounds: Normal heart sounds.  Pulmonary:     Effort: Pulmonary effort  is normal. No tachypnea or respiratory distress.     Breath sounds: Normal breath sounds and air entry.  Abdominal:     General: Abdomen is flat. Bowel sounds are normal.     Palpations: Abdomen is soft.     Tenderness: There is generalized abdominal tenderness.  Skin:    General: Skin is warm and dry.     Capillary Refill: Capillary refill takes less than 2 seconds.     Coloration: Skin is pale.  Neurological:     Mental Status: She is alert. Mental status is at baseline.  Psychiatric:        Mood and Affect: Mood normal.        Behavior: Behavior normal.     ED Results / Procedures / Treatments   Labs (all labs ordered are listed, but only abnormal results are displayed) Labs Reviewed  CBC WITH DIFFERENTIAL/PLATELET - Abnormal; Notable for the following components:      Result Value   WBC 11.1 (*)    Neutro Abs 9.2 (*)    All other components within normal limits  COMPREHENSIVE METABOLIC PANEL - Abnormal; Notable for the following components:   Potassium 3.0 (*)    CO2 20 (*)    Glucose, Bld 158 (*)    Creatinine, Ser 1.72 (*)    Total Protein 6.3 (*)  AST 50 (*)    GFR, Estimated 30 (*)    Anion gap 16 (*)    All other components within normal limits  LIPASE, BLOOD - Abnormal; Notable for the following components:   Lipase 75 (*)    All other components within normal limits  CBG MONITORING, ED - Abnormal; Notable for the following components:   Glucose-Capillary 152 (*)    All other components within normal limits  I-STAT CG4 LACTIC ACID, ED - Abnormal; Notable for the following components:   Lactic Acid, Venous 2.3 (*)    All other components within normal limits  URINALYSIS, ROUTINE W REFLEX MICROSCOPIC  I-STAT CG4 LACTIC ACID, ED  TROPONIN I (HIGH SENSITIVITY)  TROPONIN I (HIGH SENSITIVITY)    EKG EKG Interpretation Date/Time:  Sunday May 17 2023 19:50:07 EDT Ventricular Rate:  66 PR Interval:  203 QRS Duration:  118 QT Interval:  538 QTC  Calculation: 564 R Axis:   14  Text Interpretation: Sinus rhythm Incomplete right bundle branch block Low voltage, precordial leads No significant change since last tracing Confirmed by Gwyneth Sprout ([id number]) on 05/17/2023 9:21:21 PM  Radiology DG Chest Portable 1 View  Result Date: 05/17/2023 CLINICAL DATA:  Weakness, COVID positive. EXAM: PORTABLE CHEST 1 VIEW COMPARISON:  Chest radiograph dated 05/11/2023. FINDINGS: The heart size and mediastinal contours are within normal limits. Vascular calcifications are seen in the aortic arch. Both lungs are clear. Degenerative changes are seen in the spine. IMPRESSION: No active cardiopulmonary disease. Electronically Signed   By: Romona Curls M.D.   On: 05/17/2023 19:38    Procedures Procedures    Medications Ordered in ED Medications  cefTRIAXone (ROCEPHIN) 1 g in sodium chloride 0.9 % 100 mL IVPB (has no administration in time range)  lactated ringers bolus 1,000 mL (1,000 mLs Intravenous New Bag/Given 05/17/23 2023)  ondansetron (ZOFRAN) injection 4 mg (4 mg Intravenous Given 05/17/23 2024)    ED Course/ Medical Decision Making/ A&P                                 Medical Decision Making Amount and/or Complexity of Data Reviewed Labs: ordered. Radiology: ordered.  Risk Prescription drug management.   This patient presents to the ED with chief complaint(s) of chest pain, shortness of breath, weakness, N/V/D, abdominal pain with pertinent past medical history of recent COVID diagnosis, hypertension, GERD.  The complaint involves an extensive differential diagnosis and also carries with it a high risk of complications and morbidity.    The differential diagnosis includes metabolic derangement, sepsis, electrolyte disturbance, COVID, pneumonia   The initial plan is to obtain labs, chest x-ray, ECG  Additional history obtained: Additional history obtained from family, patient's daughter at bedside with her Records reviewed  previous ED  visit for similar symptoms, PCP visit 05/08/23  Initial Assessment:   Exam significant for ill-appearing patient who is not in acute distress.  She is visibly shaking and complaining of feeling cold.  Lungs are clear to auscultation bilaterally with adequate tidal volume.  Heart rate is normal with regular rhythm.  Skin is pale, but warm and dry.  Abdomen is soft with generalized tenderness.  No abdominal distension.  Patient does have episode of vomiting during exam.   Independent ECG/labs interpretation:  The following labs were independently interpreted:  Elevated lactic at 2.3.  CBC with mild leukocytosis.  Lipase mildly elevated at 75.  Metabolic panel with hypokalemia, mildly  elevated AST, anion gap.  Cr above patient's baseline, worse than previous from last week.  Initial troponin is 7.   Independent visualization and interpretation of imaging: I independently visualized the following imaging with scope of interpretation limited to determining acute life threatening conditions related to emergency care: chest x-ray, which revealed no acute cardiopulmonary process.   Treatment and Reassessment: Will give patient IV fluid bolus and Zofran.  Suspect patient likely dehydrated from vomiting, diarrhea, and decreased PO intake.    Disposition:   9:22 PM Care transferred to Dr. Anitra Lauth at the end of my shift as the patient will require reassessment once labs/imaging have resulted. Patient presentation, ED course, and plan of care discussed with review of all pertinent labs and imaging. Please see his/her note for further details regarding further ED course and disposition. Plan at time of handoff is await full workup results.  Plan to admit patient following completion of workup. This may be altered or completely changed at the discretion of the oncoming team pending results of further workup.           Final Clinical Impression(s) / ED Diagnoses Final diagnoses:  Bilious vomiting with  nausea  Generalized weakness  Generalized abdominal pain    Rx / DC Orders ED Discharge Orders     None         Lenard Simmer, PA-C 05/17/23 2123    Anders Simmonds T, DO 05/18/23 2122

## 2023-05-17 NOTE — ED Triage Notes (Addendum)
Pt BIB [facility] from home, Pt diagnosed with Covid x 1 week ago, c/o weakness, diarrhea,  chest wall pain x 2-3 days, denies fever, v/s en route 112/46, HR 64, 99% RA, RR 17, GCS 15, CBG 160

## 2023-05-18 ENCOUNTER — Other Ambulatory Visit: Payer: Self-pay

## 2023-05-18 ENCOUNTER — Encounter (HOSPITAL_COMMUNITY): Payer: Self-pay | Admitting: Internal Medicine

## 2023-05-18 DIAGNOSIS — N390 Urinary tract infection, site not specified: Secondary | ICD-10-CM

## 2023-05-18 DIAGNOSIS — I10 Essential (primary) hypertension: Secondary | ICD-10-CM | POA: Insufficient documentation

## 2023-05-18 DIAGNOSIS — R55 Syncope and collapse: Secondary | ICD-10-CM

## 2023-05-18 DIAGNOSIS — E876 Hypokalemia: Secondary | ICD-10-CM

## 2023-05-18 DIAGNOSIS — R112 Nausea with vomiting, unspecified: Secondary | ICD-10-CM

## 2023-05-18 DIAGNOSIS — R9431 Abnormal electrocardiogram [ECG] [EKG]: Secondary | ICD-10-CM

## 2023-05-18 DIAGNOSIS — N179 Acute kidney failure, unspecified: Secondary | ICD-10-CM | POA: Diagnosis present

## 2023-05-18 DIAGNOSIS — E039 Hypothyroidism, unspecified: Secondary | ICD-10-CM

## 2023-05-18 DIAGNOSIS — R079 Chest pain, unspecified: Secondary | ICD-10-CM

## 2023-05-18 DIAGNOSIS — R197 Diarrhea, unspecified: Secondary | ICD-10-CM

## 2023-05-18 DIAGNOSIS — I129 Hypertensive chronic kidney disease with stage 1 through stage 4 chronic kidney disease, or unspecified chronic kidney disease: Secondary | ICD-10-CM | POA: Diagnosis present

## 2023-05-18 DIAGNOSIS — J45909 Unspecified asthma, uncomplicated: Secondary | ICD-10-CM | POA: Diagnosis present

## 2023-05-18 DIAGNOSIS — K760 Fatty (change of) liver, not elsewhere classified: Secondary | ICD-10-CM | POA: Diagnosis present

## 2023-05-18 DIAGNOSIS — G309 Alzheimer's disease, unspecified: Secondary | ICD-10-CM | POA: Diagnosis present

## 2023-05-18 DIAGNOSIS — K219 Gastro-esophageal reflux disease without esophagitis: Secondary | ICD-10-CM | POA: Diagnosis present

## 2023-05-18 DIAGNOSIS — E1122 Type 2 diabetes mellitus with diabetic chronic kidney disease: Secondary | ICD-10-CM | POA: Diagnosis present

## 2023-05-18 DIAGNOSIS — R1084 Generalized abdominal pain: Secondary | ICD-10-CM | POA: Diagnosis present

## 2023-05-18 DIAGNOSIS — E872 Acidosis, unspecified: Secondary | ICD-10-CM | POA: Diagnosis present

## 2023-05-18 DIAGNOSIS — E785 Hyperlipidemia, unspecified: Secondary | ICD-10-CM | POA: Diagnosis present

## 2023-05-18 DIAGNOSIS — G8929 Other chronic pain: Secondary | ICD-10-CM | POA: Diagnosis present

## 2023-05-18 DIAGNOSIS — N1831 Chronic kidney disease, stage 3a: Secondary | ICD-10-CM | POA: Diagnosis present

## 2023-05-18 DIAGNOSIS — G2581 Restless legs syndrome: Secondary | ICD-10-CM | POA: Diagnosis present

## 2023-05-18 DIAGNOSIS — C8580 Other specified types of non-Hodgkin lymphoma, unspecified site: Secondary | ICD-10-CM | POA: Diagnosis present

## 2023-05-18 DIAGNOSIS — R531 Weakness: Secondary | ICD-10-CM | POA: Diagnosis present

## 2023-05-18 DIAGNOSIS — F0283 Dementia in other diseases classified elsewhere, unspecified severity, with mood disturbance: Secondary | ICD-10-CM | POA: Diagnosis present

## 2023-05-18 DIAGNOSIS — Z8616 Personal history of COVID-19: Secondary | ICD-10-CM | POA: Diagnosis not present

## 2023-05-18 DIAGNOSIS — Z79890 Hormone replacement therapy (postmenopausal): Secondary | ICD-10-CM | POA: Diagnosis not present

## 2023-05-18 DIAGNOSIS — E1140 Type 2 diabetes mellitus with diabetic neuropathy, unspecified: Secondary | ICD-10-CM | POA: Diagnosis present

## 2023-05-18 DIAGNOSIS — E860 Dehydration: Secondary | ICD-10-CM | POA: Diagnosis present

## 2023-05-18 LAB — COMPREHENSIVE METABOLIC PANEL
ALT: 32 U/L (ref 0–44)
AST: 33 U/L (ref 15–41)
Albumin: 3.3 g/dL — ABNORMAL LOW (ref 3.5–5.0)
Alkaline Phosphatase: 81 U/L (ref 38–126)
Anion gap: 13 (ref 5–15)
BUN: 14 mg/dL (ref 8–23)
CO2: 24 mmol/L (ref 22–32)
Calcium: 8.8 mg/dL — ABNORMAL LOW (ref 8.9–10.3)
Chloride: 105 mmol/L (ref 98–111)
Creatinine, Ser: 1.4 mg/dL — ABNORMAL HIGH (ref 0.44–1.00)
GFR, Estimated: 38 mL/min — ABNORMAL LOW (ref >60)
Glucose, Bld: 101 mg/dL — ABNORMAL HIGH (ref 70–99)
Potassium: 3 mmol/L — ABNORMAL LOW (ref 3.5–5.1)
Sodium: 142 mmol/L (ref 135–145)
Total Bilirubin: 0.8 mg/dL (ref 0.3–1.2)
Total Protein: 5.4 g/dL — ABNORMAL LOW (ref 6.5–8.1)

## 2023-05-18 LAB — LIPASE, BLOOD: Lipase: 26 U/L (ref 11–51)

## 2023-05-18 LAB — I-STAT CG4 LACTIC ACID, ED: Lactic Acid, Venous: 0.9 mmol/L (ref 0.5–1.9)

## 2023-05-18 LAB — MAGNESIUM: Magnesium: 1.5 mg/dL — ABNORMAL LOW (ref 1.7–2.4)

## 2023-05-18 LAB — TSH: TSH: 1.189 u[IU]/mL (ref 0.350–4.500)

## 2023-05-18 MED ORDER — POTASSIUM CHLORIDE 10 MEQ/100ML IV SOLN
10.0000 meq | INTRAVENOUS | Status: AC
  Administered 2023-05-18 (×6): 10 meq via INTRAVENOUS
  Filled 2023-05-18 (×6): qty 100

## 2023-05-18 MED ORDER — LEVOTHYROXINE SODIUM 75 MCG PO TABS
75.0000 ug | ORAL_TABLET | Freq: Every day | ORAL | Status: DC
  Administered 2023-05-18 – 2023-05-20 (×3): 75 ug via ORAL
  Filled 2023-05-18 (×3): qty 1

## 2023-05-18 MED ORDER — SODIUM CHLORIDE 0.9 % IV SOLN: INTRAVENOUS | Status: DC

## 2023-05-18 MED ORDER — MIRTAZAPINE 15 MG PO TABS
7.5000 mg | ORAL_TABLET | Freq: Every day | ORAL | Status: DC
  Administered 2023-05-18 – 2023-05-19 (×2): 7.5 mg via ORAL
  Filled 2023-05-18 (×2): qty 1

## 2023-05-18 MED ORDER — ROPINIROLE HCL 0.25 MG PO TABS
0.2500 mg | ORAL_TABLET | Freq: Every day | ORAL | Status: DC
  Administered 2023-05-18 – 2023-05-19 (×2): 0.25 mg via ORAL
  Filled 2023-05-18 (×3): qty 1

## 2023-05-18 MED ORDER — PANTOPRAZOLE SODIUM 40 MG PO TBEC: 40.0000 mg | DELAYED_RELEASE_TABLET | Freq: Every day | ORAL | Status: DC

## 2023-05-18 MED ORDER — ALBUTEROL SULFATE (2.5 MG/3ML) 0.083% IN NEBU: 2.5000 mg | INHALATION_SOLUTION | Freq: Four times a day (QID) | RESPIRATORY_TRACT | Status: DC | PRN

## 2023-05-18 MED ORDER — ACETAMINOPHEN 325 MG PO TABS
650.0000 mg | ORAL_TABLET | Freq: Four times a day (QID) | ORAL | Status: DC | PRN
  Administered 2023-05-18: 650 mg via ORAL
  Filled 2023-05-18: qty 2

## 2023-05-18 MED ORDER — ACETAMINOPHEN 325 MG PO TABS
650.0000 mg | ORAL_TABLET | Freq: Four times a day (QID) | ORAL | Status: DC | PRN
  Administered 2023-05-19 – 2023-05-20 (×2): 650 mg via ORAL
  Filled 2023-05-18 (×2): qty 2

## 2023-05-18 MED ORDER — ROSUVASTATIN CALCIUM 20 MG PO TABS: 20.0000 mg | ORAL_TABLET | Freq: Every day | ORAL | Status: DC

## 2023-05-18 MED ORDER — TRIMETHOBENZAMIDE HCL 100 MG/ML IM SOLN
200.0000 mg | Freq: Three times a day (TID) | INTRAMUSCULAR | Status: DC | PRN
  Administered 2023-05-18 (×2): 200 mg via INTRAMUSCULAR
  Filled 2023-05-18 (×3): qty 2

## 2023-05-18 MED ORDER — DONEPEZIL HCL 10 MG PO TABS
10.0000 mg | ORAL_TABLET | Freq: Every day | ORAL | Status: DC
  Administered 2023-05-18 – 2023-05-20 (×3): 10 mg via ORAL
  Filled 2023-05-18 (×3): qty 1

## 2023-05-18 MED ORDER — PANTOPRAZOLE SODIUM 40 MG PO TBEC
40.0000 mg | DELAYED_RELEASE_TABLET | Freq: Every day | ORAL | Status: DC
  Administered 2023-05-19 – 2023-05-20 (×2): 40 mg via ORAL
  Filled 2023-05-18 (×2): qty 1

## 2023-05-18 MED ORDER — AMLODIPINE BESYLATE 5 MG PO TABS: 10.0000 mg | ORAL_TABLET | Freq: Every day | ORAL | Status: DC

## 2023-05-18 MED ORDER — SODIUM CHLORIDE 0.9 % IV SOLN
2.0000 g | INTRAVENOUS | Status: DC
  Administered 2023-05-18 – 2023-05-19 (×2): 2 g via INTRAVENOUS
  Filled 2023-05-18 (×2): qty 20

## 2023-05-18 MED ORDER — MAGNESIUM SULFATE 4 GM/100ML IV SOLN
4.0000 g | Freq: Once | INTRAVENOUS | Status: AC
  Administered 2023-05-18: 4 g via INTRAVENOUS
  Filled 2023-05-18: qty 100

## 2023-05-18 MED ORDER — VITAMIN B-12 1000 MCG PO TABS
1000.0000 ug | ORAL_TABLET | Freq: Every day | ORAL | Status: DC
  Administered 2023-05-18 – 2023-05-20 (×3): 1000 ug via ORAL
  Filled 2023-05-18 (×3): qty 1

## 2023-05-18 MED ORDER — ACETAMINOPHEN 650 MG RE SUPP: 650.0000 mg | Freq: Four times a day (QID) | RECTAL | Status: DC | PRN

## 2023-05-18 MED ORDER — KCL IN DEXTROSE-NACL 20-5-0.9 MEQ/L-%-% IV SOLN
INTRAVENOUS | Status: DC
  Filled 2023-05-18 (×3): qty 1000

## 2023-05-18 MED ORDER — GABAPENTIN 100 MG PO CAPS: 100.0000 mg | ORAL_CAPSULE | Freq: Every day | ORAL | Status: DC

## 2023-05-18 MED ORDER — HEPARIN SODIUM (PORCINE) 5000 UNIT/ML IJ SOLN
5000.0000 [IU] | Freq: Three times a day (TID) | INTRAMUSCULAR | Status: DC
  Administered 2023-05-18 – 2023-05-20 (×7): 5000 [IU] via SUBCUTANEOUS
  Filled 2023-05-18 (×7): qty 1

## 2023-05-18 MED ORDER — PANTOPRAZOLE SODIUM 40 MG IV SOLR
40.0000 mg | Freq: Once | INTRAVENOUS | Status: AC
  Administered 2023-05-18: 40 mg via INTRAVENOUS
  Filled 2023-05-18: qty 10

## 2023-05-18 NOTE — Evaluation (Signed)
Occupational Therapy Evaluation Patient Details Name: Elizabeth Mcintyre MRN: [id number] DOB: 24-Jan-1941 Today's Date: 05/18/2023   History of Present Illness 82 y.o. female presented to the ED 05/17/23 complaining of generalized weakness, chest pain, shortness of breath, abdominal pain, nausea, vomiting, diarrhea, and syncope. Recent COVIC (8/23). +AKI, troponin negative, UTI,  PMH significant of hypertension, hyperlipidemia, asthma, GERD, hypothyroidism, CKD stage IIIa, marginal zone lymphoma, prediabetes, RLS, Alzheimer's dementia, hepatic steatosis   Clinical Impression   PTA, pt lived with son and reports being independent with ADL; son assisting with IADL. Pt did report that she drives; unsure of accuracy. Upon eval, pt presents with mildly decreased strength, activity tolerance, and balance. Has cognitive decline at baseline. Pt endorsing dizziness during session, RN reporting orthostatics have already been taken. CGA for OOB mobility at this time. Will follow acutely but do not suspect need for follow up OT after discharge.       If plan is discharge home, recommend the following: A little help with walking and/or transfers;A little help with bathing/dressing/bathroom;Assistance with cooking/housework;Direct supervision/assist for financial management;Direct supervision/assist for medications management;Assist for transportation    Functional Status Assessment  Patient has had a recent decline in their functional status and demonstrates the ability to make significant improvements in function in a reasonable and predictable amount of time.  Equipment Recommendations  None recommended by OT    Recommendations for Other Services       Precautions / Restrictions Precautions Precautions: Fall;Other (comment) Precaution Comments: orthostasis with recent syncope      Mobility Bed Mobility               General bed mobility comments: sitting EOB on arrival    Transfers Overall  transfer level: Needs assistance Equipment used: None Transfers: Sit to/from Stand Sit to Stand: Contact guard assist           General transfer comment: CGA due to dizziness; nursing reported 21 mmHg drop with orthostatic BPs      Balance Overall balance assessment: Independent                                         ADL either performed or assessed with clinical judgement   ADL Overall ADL's : Needs assistance/impaired Eating/Feeding: Modified independent;Sitting   Grooming: Contact guard assist;Standing Grooming Details (indicate cue type and reason): mild dizziness at sink Upper Body Bathing: Set up;Sitting   Lower Body Bathing: Contact guard assist;Sit to/from stand   Upper Body Dressing : Set up;Sitting   Lower Body Dressing: Contact guard assist;Sit to/from stand   Toilet Transfer: Contact guard assist;Ambulation   Toileting- Clothing Manipulation and Hygiene: Sit to/from stand;Sitting/lateral lean;Contact guard assist       Functional mobility during ADLs: Contact guard assist General ADL Comments: dementia at baseline     Vision Ability to See in Adequate Light: 0 Adequate Patient Visual Report: No change from baseline Vision Assessment?: No apparent visual deficits Additional Comments: WFL for tasks assessed     Perception         Praxis         Pertinent Vitals/Pain Pain Assessment Pain Assessment: Faces Faces Pain Scale: Hurts a little bit Pain Location: RLE Pain Descriptors / Indicators: Burning Pain Intervention(s): Monitored during session, Limited activity within patient's tolerance     Extremity/Trunk Assessment Upper Extremity Assessment Upper Extremity Assessment: Generalized weakness   Lower Extremity Assessment  Lower Extremity Assessment: RLE deficits/detail RLE Deficits / Details: pt reports recent shingles with continued pain; knee extension 3+ limited by pain   Cervical / Trunk Assessment Cervical /  Trunk Assessment: Normal   Communication Communication Communication: No apparent difficulties Cueing Techniques: Verbal cues   Cognition Arousal: Alert Behavior During Therapy: WFL for tasks assessed/performed Overall Cognitive Status: History of cognitive impairments - at baseline                                 General Comments: able to answer all home setup questions with daughters confirming accuracy; follows all commands. Pt reporting family provides set-up A  with pillbox for medication management. Pt also reporting thst she drives and occasionally grocery shops; unsure accuracy     General Comments  Daughters present    Exercises     Shoulder Instructions      Home Living Family/patient expects to be discharged to:: Private residence Living Arrangements: Children (son) Available Help at Discharge: Family;Available 24 hours/day Type of Home: House             Bathroom Shower/Tub: Tub/shower unit         Home Equipment: Grab bars - tub/shower          Prior Functioning/Environment Prior Level of Function : Needs assist             Mobility Comments: independent without a device ADLs Comments: son does all IADLs; pt normally ind with BADLs (since sick has needed some assist)        OT Problem List: Decreased strength;Decreased activity tolerance;Impaired balance (sitting and/or standing);Decreased cognition;Decreased knowledge of use of DME or AE      OT Treatment/Interventions: Self-care/ADL training;Therapeutic exercise;DME and/or AE instruction;Balance training;Patient/family education;Therapeutic activities;Cognitive remediation/compensation    OT Goals(Current goals can be found in the care plan section) Acute Rehab OT Goals Patient Stated Goal: go home OT Goal Formulation: With patient Time For Goal Achievement: 06/01/23 Potential to Achieve Goals: Good  OT Frequency: Min 1X/week    Co-evaluation              AM-PAC  OT "6 Clicks" Daily Activity     Outcome Measure Help from another person eating meals?: None Help from another person taking care of personal grooming?: A Little Help from another person toileting, which includes using toliet, bedpan, or urinal?: A Little Help from another person bathing (including washing, rinsing, drying)?: A Little Help from another person to put on and taking off regular upper body clothing?: A Little Help from another person to put on and taking off regular lower body clothing?: A Little 6 Click Score: 19   End of Session Equipment Utilized During Treatment: Gait belt Nurse Communication: Mobility status  Activity Tolerance: Patient tolerated treatment well Patient left: in bed;with call bell/phone within reach;with bed alarm set  OT Visit Diagnosis: Unsteadiness on feet (R26.81);Muscle weakness (generalized) (M62.81);Other symptoms and signs involving cognitive function                Time: 4098-1191 OT Time Calculation (min): 15 min Charges:  OT General Charges $OT Visit: 1 Visit OT Evaluation $OT Eval Low Complexity: 1 Low  Tyler Deis, OTR/L [facility] Acute Rehabilitation Office: [phone]   Myrla Halsted 05/18/2023, 11:38 AM

## 2023-05-18 NOTE — H&P (Signed)
History and Physical    Elizabeth Mcintyre YQM:[id number] DOB: 17-Feb-1941 DOA: 05/17/2023  PCP: Anabel Halon, MD  Patient coming from: Home  Chief Complaint: Generalized weakness  HPI: Elizabeth Mcintyre is a 82 y.o. female with medical history significant of hypertension, hyperlipidemia, asthma, GERD, hypothyroidism, CKD stage IIIa, marginal zone lymphoma, prediabetes, RLS, Alzheimer's dementia, hepatic steatosis presented to the ED complaining of generalized weakness, chest pain, shortness of breath, abdominal pain, nausea, vomiting, and diarrhea.  She recently tested positive for COVID on 8/23 and was treated with Paxlovid.  In the ED, patient was afebrile and not tachycardic.  Not hypotensive or hypoxic.  Labs notable for WBC 11.1, potassium 3.0, bicarb 20, glucose 158, creatinine 1.7, AST 50 and remainder of LFTs normal, lipase 75, troponin negative x 2, lactic acid 2.3> 2.5> 0.9.  UA with large amount of leukocytes and microscopy showing 11-20 WBCs and few bacteria.  CT chest/abdomen/pelvis negative for acute findings. Patient was given Zofran, ceftriaxone, and 1 L LR.  [facility] called to admit.  Patient states she tested positive for COVID on 8/23 and finished treatment with Paxlovid.  She continues to have nausea, vomiting, and diarrhea although diarrhea is a chronic problem for which she has had her stool tested by her PCP multiple times in the past.  Having generalized abdominal pain.  She has also had some mild substernal chest pressure.  She feels weak and dehydrated.  Yesterday as she was going from sitting to standing position, she felt dizzy and then lost consciousness.  Her son was there to support her and she did not fall to the ground or sustain any injuries.  She is also endorsing urinary frequency and urgency.  Review of Systems:  Review of Systems  All other systems reviewed and are negative.   No past medical history on file.     has no history on file for tobacco use, alcohol use,  and drug use.  Allergies  Allergen Reactions   Codeine Nausea And Vomiting and Rash    No family history on file.  Prior to Admission medications   Medication Sig Start Date End Date Taking? Authorizing Provider  albuterol (VENTOLIN HFA) 108 (90 Base) MCG/ACT inhaler Inhale 2 puffs into the lungs every 6 (six) hours as needed. 05/08/23  Yes [provider]  amLODipine (NORVASC) 10 MG tablet Take 10 mg by mouth daily. 01/30/23  Yes [provider]  ASCORBIC ACID PO Take 1 tablet by mouth daily.   Yes [provider]  Cholecalciferol (VITAMIN D3 PO) Take 1 tablet by mouth daily.   Yes [provider]  Cyanocobalamin (VITAMIN B12 PO) Take 1 tablet by mouth daily.   Yes [provider]  donepezil (ARICEPT) 10 MG tablet Take 10 mg by mouth daily. 12/29/22  Yes [provider]  gabapentin (NEURONTIN) 100 MG capsule Take 100 mg by mouth daily. 02/19/23  Yes [provider]  levothyroxine (SYNTHROID) 75 MCG tablet Take 75 mcg by mouth daily. 03/27/23  Yes [provider]  mirtazapine (REMERON) 7.5 MG tablet Take 7.5 mg by mouth at bedtime. 03/31/23  Yes [provider]  pantoprazole (PROTONIX) 40 MG tablet Take 40 mg by mouth daily. 03/31/23  Yes [provider]  rOPINIRole (REQUIP) 0.25 MG tablet Take 0.25 mg by mouth at bedtime. 01/30/23  Yes [provider]  rosuvastatin (CRESTOR) 20 MG tablet Take 20 mg by mouth daily. 01/30/23  Yes [provider]  sucralfate (CARAFATE) 1 g tablet Take 1  g by mouth 4 (four) times daily. 03/27/23  Yes [provider]    Physical Exam: Vitals:   05/18/23 0200 05/18/23 0230 05/18/23 0300 05/18/23 0330  BP: (!) 129/47 (!) 122/49 (!) 118/53 (!) 118/47  Pulse: 76 85 73 79  Resp: 16 15 15 17   Temp:    98.3 F (36.8 C)  TempSrc:      SpO2: 97% 97% 96% 98%  Weight:      Height:        Physical Exam Vitals reviewed.  Constitutional:      General:  She is not in acute distress. HENT:     Head: Normocephalic and atraumatic.  Eyes:     Extraocular Movements: Extraocular movements intact.  Cardiovascular:     Rate and Rhythm: Normal rate and regular rhythm.     Pulses: Normal pulses.  Pulmonary:     Effort: Pulmonary effort is normal. No respiratory distress.     Breath sounds: Normal breath sounds. No wheezing or rales.  Abdominal:     General: Bowel sounds are normal. There is no distension.     Palpations: Abdomen is soft.     Tenderness: There is no abdominal tenderness.  Musculoskeletal:     Cervical back: Normal range of motion.     Right lower leg: No edema.     Left lower leg: No edema.  Skin:    General: Skin is warm and dry.  Neurological:     General: No focal deficit present.     Mental Status: She is alert and oriented to person, place, and time.     Labs on Admission: I have personally reviewed following labs and imaging studies  CBC: Recent Labs  Lab 05/17/23 2016  WBC 11.1*  NEUTROABS 9.2*  HGB 14.5  HCT 42.8  MCV 84.1  PLT 189   Basic Metabolic Panel: Recent Labs  Lab 05/17/23 2016  NA 138  K 3.0*  CL 102  CO2 20*  GLUCOSE 158*  BUN 18  CREATININE 1.72*  CALCIUM 9.1   GFR: Estimated Creatinine Clearance: 22.6 mL/min (A) (by C-G formula based on SCr of 1.72 mg/dL (H)). Liver Function Tests: Recent Labs  Lab 05/17/23 2016  AST 50*  ALT 40  ALKPHOS 99  BILITOT 1.0  PROT 6.3*  ALBUMIN 4.0   Recent Labs  Lab 05/17/23 2016  LIPASE 75*   No results for input(s): "AMMONIA" in the last 168 hours. Coagulation Profile: No results for input(s): "INR", "PROTIME" in the last 168 hours. Cardiac Enzymes: No results for input(s): "CKTOTAL", "CKMB", "CKMBINDEX", "TROPONINI" in the last 168 hours. BNP (last 3 results) No results for input(s): "PROBNP" in the last 8760 hours. HbA1C: No results for input(s): "HGBA1C" in the last 72 hours. CBG: Recent Labs  Lab 05/17/23 1952  GLUCAP  152*   Lipid Profile: No results for input(s): "CHOL", "HDL", "LDLCALC", "TRIG", "CHOLHDL", "LDLDIRECT" in the last 72 hours. Thyroid Function Tests: No results for input(s): "TSH", "T4TOTAL", "FREET4", "T3FREE", "THYROIDAB" in the last 72 hours. Anemia Panel: No results for input(s): "VITAMINB12", "FOLATE", "FERRITIN", "TIBC", "IRON", "RETICCTPCT" in the last 72 hours. Urine analysis:    Component Value Date/Time   COLORURINE YELLOW 05/17/2023 2225   APPEARANCEUR HAZY (A) 05/17/2023 2225   LABSPEC 1.005 05/17/2023 2225   PHURINE 7.0 05/17/2023 2225   GLUCOSEU NEGATIVE 05/17/2023 2225   HGBUR NEGATIVE 05/17/2023 2225   BILIRUBINUR NEGATIVE 05/17/2023 2225   KETONESUR 20 (A) 05/17/2023 2225   PROTEINUR NEGATIVE  05/17/2023 2225   NITRITE NEGATIVE 05/17/2023 2225   LEUKOCYTESUR LARGE (A) 05/17/2023 2225    Radiological Exams on Admission: CT CHEST ABDOMEN PELVIS W CONTRAST  Result Date: 05/17/2023 CLINICAL DATA:  Shortness of breath, abdominal pain, vomiting, diarrhea. Weakness. COVID. EXAM: CT CHEST, ABDOMEN, AND PELVIS WITH CONTRAST TECHNIQUE: Multidetector CT imaging of the chest, abdomen and pelvis was performed following the standard protocol during bolus administration of intravenous contrast. RADIATION DOSE REDUCTION: This exam was performed according to the departmental dose-optimization program which includes automated exposure control, adjustment of the mA and/or kV according to patient size and/or use of iterative reconstruction technique. CONTRAST:  60mL OMNIPAQUE IOHEXOL 350 MG/ML SOLN COMPARISON:  05/11/2023 CT abdomen and pelvis.  Chest CT 01/14/2022. FINDINGS: CT CHEST FINDINGS Cardiovascular: Heart is normal size. Aorta is normal caliber. Moderate coronary artery and aortic calcifications. Mediastinum/Nodes: No mediastinal, hilar, or axillary adenopathy. Trachea and esophagus are unremarkable. Thyroid unremarkable. Lungs/Pleura: Lungs are clear. No focal airspace opacities or  suspicious nodules. No effusions. Musculoskeletal: Chest wall soft tissues are unremarkable. No acute bony abnormality. CT ABDOMEN PELVIS FINDINGS Hepatobiliary: No focal liver abnormality is seen. Status post cholecystectomy. No biliary dilatation. Pancreas: No focal abnormality or ductal dilatation. Spleen: No focal abnormality.  Normal size. Adrenals/Urinary Tract: No adrenal abnormality. No focal renal abnormality. No stones or hydronephrosis. Bladder decompressed. Stomach/Bowel: Diffuse colonic diverticulosis. No active diverticulitis. Stomach and small bowel decompressed, unremarkable. Vascular/Lymphatic: Aortoiliac atherosclerosis. No evidence of aneurysm or adenopathy. Reproductive: Prior hysterectomy.  No adnexal masses. Other: No free fluid or free air. Musculoskeletal: No acute bony abnormality. IMPRESSION: No acute findings in the chest, abdomen or pelvis. Coronary artery disease, aortic atherosclerosis. Diffuse colonic diverticulosis. Electronically Signed   By: Charlett Nose M.D.   On: 05/17/2023 22:30   DG Chest Portable 1 View  Result Date: 05/17/2023 CLINICAL DATA:  Weakness, COVID positive. EXAM: PORTABLE CHEST 1 VIEW COMPARISON:  Chest radiograph dated 05/11/2023. FINDINGS: The heart size and mediastinal contours are within normal limits. Vascular calcifications are seen in the aortic arch. Both lungs are clear. Degenerative changes are seen in the spine. IMPRESSION: No active cardiopulmonary disease. Electronically Signed   By: Romona Curls M.D.   On: 05/17/2023 19:38    EKG: Independently reviewed.  Sinus rhythm with first-degree AV block, diffuse borderline T wave abnormalities, QTc 564.  Assessment and Plan  AKI on CKD stage IIIa Likely prerenal from dehydration.  Creatinine currently 1.7, baseline 1.2-1.3.  CT without evidence of obstructive uropathy.  Continue IV fluid hydration and monitor renal function.  Avoid nephrotoxic agents.  Generalized abdominal pain, nausea,  vomiting In the setting of recent COVID infection.  AST 50 and remainder of LFTs normal, lipase 75.  CT abdomen pelvis negative for acute finding.  Continue symptomatic management.  IM Tigan PRN nausea/vomiting, avoid QT prolonging antiemetics.  Trend lipase and LFTs.  Diarrhea Appears to be a chronic issue and started again in setting of recent COVID infection.  CT without evidence of colitis.  C. difficile PCR and GI pathogen panel, enteric precautions.  Chest pain Troponin negative x 2 and not consistent with ACS.  PE less likely given no tachycardia, tachypnea, or hypoxia.  CT chest with contrast negative for acute findings in the chest.  Suspected UTI Patient is endorsing urinary frequency and urgency.  UA with large amount of leukocytes and microscopy showing 11-20 WBCs and few bacteria.  No fever or significant leukocytosis.  Mild lactic acidosis resolved with IV fluids, suspect this is  related to dehydration rather than sepsis.  Continue ceftriaxone and add on urine culture.  Syncope Likely orthostatic in nature in the setting of vomiting, diarrhea, and dehydration.  Hold antihypertensives at this time and check orthostatics.  Continue IV fluid hydration.  Recent COVID infection Tested positive for COVID on 8/23 and was treated with Paxlovid.  Not tachypneic or hypoxic.  CT without evidence of pneumonia.  Continue airborne and contact precautions at this time.  Generalized weakness PT/OT eval, fall precautions.  Mild hypokalemia QT prolongation Monitor potassium and magnesium levels, continue to replace as needed.  Avoid QT prolonging drugs.  Hypothyroidism Continue Synthroid and check TSH.  Asthma Stable, no signs of acute exacerbation.  Continue albuterol as needed.  GERD Continue Protonix.  Marginal zone lymphoma She has finished treatment and currently on observation.  Outpatient oncology follow-up.  Alzheimer's dementia Mood disorder Continue Aricept and  mirtazapine.  Delirium precautions  RLS Continue Requip.  Chronic pain/neuropathy Continue gabapentin.  DVT prophylaxis: SQ Heparin Code Status: Full Code (discussed with the patient) Family Communication: Patient's 2 daughters at bedside. Level of care: Telemetry bed Admission status: It is my clinical opinion that referral for OBSERVATION is reasonable and necessary in this patient based on the above information provided. The aforementioned taken together are felt to place the patient at high risk for further clinical deterioration. However, it is anticipated that the patient may be medically stable for discharge from the hospital within 24 to 48 hours.  John Giovanni MD [facility]  If 7PM-7AM, please contact night-coverage [email or web address]  05/18/2023, 3:41 AM

## 2023-05-18 NOTE — Evaluation (Signed)
Physical Therapy Evaluation Patient Details Name: Elizabeth Mcintyre MRN: [id number] DOB: 26-Aug-1941 Today's Date: 05/18/2023  History of Present Illness  82 y.o. female presented to the ED 05/17/23 complaining of generalized weakness, chest pain, shortness of breath, abdominal pain, nausea, vomiting, diarrhea, and syncope. Recent COVIC (8/23). +AKI, troponin negative, UTI,  PMH significant of hypertension, hyperlipidemia, asthma, GERD, hypothyroidism, CKD stage IIIa, marginal zone lymphoma, prediabetes, RLS, Alzheimer's dementia, hepatic steatosis  Clinical Impression   Pt admitted secondary to problem above with deficits below. PTA patient was independent with mobility and BADLs. Her son completes all IADLs. She experienced syncope with fall (son caught her) PTA. RN reported she had just completed orthostatic BP measures on PT arrival and pt with 21 mmHg drop from supine to standing with +dizziness. Pt currently requires contact guard assist for ambulation due to continued dizziness, but with no signs of imbalance. Anticipate pt will progress to independence as her symptoms of orthostasis improve. Anticipate patient will benefit from PT to address problems listed below.Will continue to follow acutely to maximize functional mobility independence and safety.           If plan is discharge home, recommend the following: A little help with walking and/or transfers;Assistance with cooking/housework;Direct supervision/assist for medications management;Direct supervision/assist for financial management;Assist for transportation;Help with stairs or ramp for entrance;Supervision due to cognitive status   Can travel by private vehicle        Equipment Recommendations None recommended by PT  Recommendations for Other Services       Functional Status Assessment Patient has had a recent decline in their functional status and demonstrates the ability to make significant improvements in function in a reasonable  and predictable amount of time.     Precautions / Restrictions Precautions Precautions: Fall;Other (comment) Precaution Comments: orthostasis with recent syncope      Mobility  Bed Mobility               General bed mobility comments: sitting EOB on arrival    Transfers Overall transfer level: Needs assistance Equipment used: None Transfers: Sit to/from Stand Sit to Stand: Contact guard assist           General transfer comment: CGA due to dizziness; nursing reported 21 mmHg drop with orthostatic BPs    Ambulation/Gait Ambulation/Gait assistance: Contact guard assist Gait Distance (Feet): 15 Feet Assistive device: None Gait Pattern/deviations: WFL(Within Functional Limits)   Gait velocity interpretation: 1.31 - 2.62 ft/sec, indicative of limited community ambulator   General Gait Details: steady, but reports increasing dizziness  Stairs            Wheelchair Mobility     Tilt Bed    Modified Rankin (Stroke Patients Only)       Balance Overall balance assessment: Independent                               Standardized Balance Assessment Standardized Balance Assessment : Berg Balance Test Berg Balance Test Sit to Stand: Able to stand without using hands and stabilize independently Standing Unsupported: Able to stand safely 2 minutes Sitting with Back Unsupported but Feet Supported on Floor or Stool: Able to sit safely and securely 2 minutes Stand to Sit: Sits safely with minimal use of hands Transfers: Able to transfer safely, minor use of hands Standing Unsupported with Eyes Closed: Able to stand 10 seconds safely Standing Ubsupported with Feet Together: Able to place feet together independently and  stand 1 minute safely From Standing, Reach Forward with Outstretched Arm: Can reach confidently >25 cm (10") From Standing Position, Pick up Object from Floor: Able to pick up shoe, needs supervision From Standing Position, Turn to  Look Behind Over each Shoulder: Looks behind from both sides and weight shifts well Turn 360 Degrees: Able to turn 360 degrees safely in 4 seconds or less Standing Unsupported, Alternately Place Feet on Step/Stool: Able to complete 4 steps without aid or supervision Standing Unsupported, One Foot in Front: Able to plae foot ahead of the other independently and hold 30 seconds Standing on One Leg: Tries to lift leg/unable to hold 3 seconds but remains standing independently Total Score: 49         Pertinent Vitals/Pain Pain Assessment Pain Assessment: Faces Faces Pain Scale: Hurts a little bit Pain Location: RLE Pain Descriptors / Indicators: Burning Pain Intervention(s): Limited activity within patient's tolerance, Monitored during session    Home Living Family/patient expects to be discharged to:: Private residence Living Arrangements: Children (son) Available Help at Discharge: Family;Available 24 hours/day Type of Home: House           Home Equipment: Grab bars - tub/shower      Prior Function Prior Level of Function : Needs assist             Mobility Comments: independent without a device ADLs Comments: son does all IADLs; pt normally ind with BADLs (since sick has needed some assist)     Extremity/Trunk Assessment   Upper Extremity Assessment Upper Extremity Assessment: Defer to OT evaluation    Lower Extremity Assessment Lower Extremity Assessment: RLE deficits/detail RLE Deficits / Details: pt reports recent shingles with continued pain; knee extension 3+ limited by pain    Cervical / Trunk Assessment Cervical / Trunk Assessment: Normal  Communication   Communication Communication: No apparent difficulties Cueing Techniques: Verbal cues  Cognition Arousal: Alert Behavior During Therapy: WFL for tasks assessed/performed Overall Cognitive Status: History of cognitive impairments - at baseline                                 General  Comments: able to answer all home setup questions with daughters confirming accuracy; follows all commands        General Comments General comments (skin integrity, edema, etc.): Daughters present    Exercises     Assessment/Plan    PT Assessment Patient needs continued PT services  PT Problem List Decreased activity tolerance;Decreased balance;Decreased mobility;Decreased knowledge of use of DME;Decreased knowledge of precautions;Cardiopulmonary status limiting activity       PT Treatment Interventions DME instruction;Gait training;Functional mobility training;Therapeutic activities;Therapeutic exercise;Balance training;Patient/family education    PT Goals (Current goals can be found in the Care Plan section)  Acute Rehab PT Goals Patient Stated Goal: stomach pain to go away PT Goal Formulation: With patient Time For Goal Achievement: 06/01/23 Potential to Achieve Goals: Good    Frequency Min 1X/week     Co-evaluation               AM-PAC PT "6 Clicks" Mobility  Outcome Measure Help needed turning from your back to your side while in a flat bed without using bedrails?: None Help needed moving from lying on your back to sitting on the side of a flat bed without using bedrails?: A Little Help needed moving to and from a bed to a chair (including a wheelchair)?: A Little  Help needed standing up from a chair using your arms (e.g., wheelchair or bedside chair)?: A Little Help needed to walk in hospital room?: A Little Help needed climbing 3-5 steps with a railing? : A Little 6 Click Score: 19    End of Session Equipment Utilized During Treatment: Gait belt Activity Tolerance: Treatment limited secondary to medical complications (Comment) (dizziness) Patient left: in chair;with call bell/phone within reach;with chair alarm set;with family/visitor present Nurse Communication: Mobility status PT Visit Diagnosis: Unsteadiness on feet (R26.81);Dizziness and giddiness  (R42)    Time: 1610-9604 PT Time Calculation (min) (ACUTE ONLY): 21 min   Charges:   PT Evaluation $PT Eval Low Complexity: 1 Low   PT General Charges $$ ACUTE PT VISIT: 1 Visit          Jerolyn Center, PT Acute Rehabilitation Services  Office [phone]   Zena Amos 05/18/2023, 9:06 AM

## 2023-05-18 NOTE — Progress Notes (Signed)
Orthostatic blood pressures    05/18/23 0800  Vitals  ECG Heart Rate 68  Resp 14  MEWS COLOR  MEWS Score Color Green  Orthostatic Lying   BP- Lying 123/41  Pulse- Lying 75  Orthostatic Sitting  BP- Sitting 118/51  Pulse- Sitting 80  Orthostatic Standing at 0 minutes  BP- Standing at 0 minutes 101/46  Pulse- Standing at 0 minutes 84  MEWS Score  MEWS Temp 0  MEWS Systolic 0  MEWS Pulse 0  MEWS RR 0  MEWS LOC 0  MEWS Score 0

## 2023-05-18 NOTE — ED Notes (Addendum)
ED TO INPATIENT HANDOFF REPORT  ED Nurse Name and Phone #: Mckinnon Glick/ [phone]  S Name/Age/Gender Elizabeth Mcintyre 82 y.o. female Room/Bed: 004C/004C  Code Status   Code Status: Not on Mcintyre  Home/SNF/Other Home Patient oriented to: self, place, time, and situation Is this baseline? Yes   Triage Complete: Triage complete  Chief Complaint AKI (acute kidney injury) (HCC) [N17.9]  Triage Note Pt BIB [facility] from home, Pt diagnosed with Covid x 1 week ago, c/o weakness, diarrhea,  chest wall pain x 2-3 days, denies fever, v/s en route 112/46, HR 64, 99% RA, RR 17, GCS 15, CBG 160   Allergies Allergies  Allergen Reactions   Codeine Nausea And Vomiting and Rash    Level of Care/Admitting Diagnosis ED Disposition     ED Disposition  Admit   Condition  --   Comment  Hospital Area: [facility] [[id number]]  Level of Care: Telemetry Medical [104]  May place patient in observation at [facility] or [facility] if equivalent level of care is available:: Yes  Covid Evaluation: Recent COVID positive no isolation required infection day 21-90  Diagnosis: AKI (acute kidney injury) [facility]) [[id number]]  Admitting Physician: John Giovanni [[id number]]  Attending Physician: John Giovanni [[id number]]          B Medical/Surgery History  IV Location/Drains/Wounds Patient Lines/Drains/Airways Status     Active Line/Drains/Airways     Name Placement date Placement time Site Days   Peripheral IV 05/17/23 20 G 1" Right Antecubital 05/17/23  2021  Antecubital  1            Intake/Output Last 24 hours  Intake/Output Summary (Last 24 hours) at 05/18/2023 6644 Last data filed at 05/18/2023 0000 Gross per 24 hour  Intake 1097.58 ml  Output --  Net 1097.58 ml    Labs/Imaging Results for orders placed or performed during the hospital encounter of 05/17/23 (from the past 48 hour(s))  CBG monitoring, ED     Status: Abnormal   Collection Time: 05/17/23  7:52 PM  Result  Value Ref Range   Glucose-Capillary 152 (H) 70 - 99 mg/dL    Comment: Glucose reference range applies only to samples taken after fasting for at least 8 hours.  CBC with Differential     Status: Abnormal   Collection Time: 05/17/23  8:16 PM  Result Value Ref Range   WBC 11.1 (H) 4.0 - 10.5 K/uL   RBC 5.09 3.87 - 5.11 MIL/uL   Hemoglobin 14.5 12.0 - 15.0 g/dL   HCT 03.4 74.2 - 59.5 %   MCV 84.1 80.0 - 100.0 fL   MCH 28.5 26.0 - 34.0 pg   MCHC 33.9 30.0 - 36.0 g/dL   RDW 63.8 75.6 - 43.3 %   Platelets 189 150 - 400 K/uL   nRBC 0.0 0.0 - 0.2 %   Neutrophils Relative % 84 %   Neutro Abs 9.2 (H) 1.7 - 7.7 K/uL   Lymphocytes Relative 8 %   Lymphs Abs 0.9 0.7 - 4.0 K/uL   Monocytes Relative 6 %   Monocytes Absolute 0.7 0.1 - 1.0 K/uL   Eosinophils Relative 1 %   Eosinophils Absolute 0.1 0.0 - 0.5 K/uL   Basophils Relative 1 %   Basophils Absolute 0.1 0.0 - 0.1 K/uL   Immature Granulocytes 0 %   Abs Immature Granulocytes 0.04 0.00 - 0.07 K/uL    Comment: Performed at [facility], [address], [place], [place] [address]  Troponin I (  High Sensitivity)     Status: None   Collection Time: 05/17/23  8:16 PM  Result Value Ref Range   Troponin I (High Sensitivity) 7 <18 ng/L    Comment: (NOTE) Elevated high sensitivity troponin I (hsTnI) values and significant  changes across serial measurements may suggest ACS but many other  chronic and acute conditions are known to elevate hsTnI results.  Refer to the "Links" section for chest pain algorithms and additional  guidance. Performed at [facility], [address], [place], [place] [address]   Comprehensive metabolic panel     Status: Abnormal   Collection Time: 05/17/23  8:16 PM  Result Value Ref Range   Sodium 138 135 - 145 mmol/L   Potassium 3.0 (L) 3.5 - 5.1 mmol/L   Chloride 102 98 - 111 mmol/L   CO2 20 (L) 22 - 32 mmol/L   Glucose, Bld 158 (H) 70 - 99 mg/dL    Comment: Glucose reference range applies only to  samples taken after fasting for at least 8 hours.   BUN 18 8 - 23 mg/dL   Creatinine, Ser 9.14 (H) 0.44 - 1.00 mg/dL   Calcium 9.1 8.9 - 78.2 mg/dL   Total Protein 6.3 (L) 6.5 - 8.1 g/dL   Albumin 4.0 3.5 - 5.0 g/dL   AST 50 (H) 15 - 41 U/L   ALT 40 0 - 44 U/L   Alkaline Phosphatase 99 38 - 126 U/L   Total Bilirubin 1.0 0.3 - 1.2 mg/dL   GFR, Estimated 30 (L) >60 mL/min    Comment: (NOTE) Calculated using the CKD-EPI Creatinine Equation (2021)    Anion gap 16 (H) 5 - 15    Comment: Performed at [facility], [address], [place], [place] [address]  Lipase, blood     Status: Abnormal   Collection Time: 05/17/23  8:16 PM  Result Value Ref Range   Lipase 75 (H) 11 - 51 U/L    Comment: Performed at [facility], [address], [place], [place] [address]  I-Stat CG4 Lactic Acid     Status: Abnormal   Collection Time: 05/17/23  8:35 PM  Result Value Ref Range   Lactic Acid, Venous 2.3 (HH) 0.5 - 1.9 mmol/L   Comment NOTIFIED PHYSICIAN   Urinalysis, Routine w reflex microscopic -Urine, Clean Catch     Status: Abnormal   Collection Time: 05/17/23 10:25 PM  Result Value Ref Range   Color, Urine YELLOW YELLOW   APPearance HAZY (A) CLEAR   Specific Gravity, Urine 1.005 1.005 - 1.030   pH 7.0 5.0 - 8.0   Glucose, UA NEGATIVE NEGATIVE mg/dL   Hgb urine dipstick NEGATIVE NEGATIVE   Bilirubin Urine NEGATIVE NEGATIVE   Ketones, ur 20 (A) NEGATIVE mg/dL   Protein, ur NEGATIVE NEGATIVE mg/dL   Nitrite NEGATIVE NEGATIVE   Leukocytes,Ua LARGE (A) NEGATIVE   RBC / HPF 0-5 0 - 5 RBC/hpf   WBC, UA 11-20 0 - 5 WBC/hpf   Bacteria, UA FEW (A) NONE SEEN   Squamous Epithelial / HPF 0-5 0 - 5 /HPF    Comment: Performed at [facility], [address], [place], [place] [address]  Troponin I (High Sensitivity)     Status: None   Collection Time: 05/17/23 10:25 PM  Result Value Ref Range   Troponin I (High Sensitivity) 7 <18 ng/L    Comment: (NOTE) Elevated high sensitivity  troponin I (hsTnI) values and significant  changes across serial measurements may suggest ACS but many other  chronic and acute conditions are known to elevate hsTnI results.  Refer to the "Links" section for chest pain algorithms and additional  guidance. Performed at [facility], [address] [place], [place] [address]   I-Stat CG4 Lactic Acid     Status: Abnormal   Collection Time: 05/17/23 10:35 PM  Result Value Ref Range   Lactic Acid, Venous 2.5 (HH) 0.5 - 1.9 mmol/L   Comment NOTIFIED PHYSICIAN   I-Stat CG4 Lactic Acid     Status: None   Collection Time: 05/18/23  2:58 AM  Result Value Ref Range   Lactic Acid, Venous 0.9 0.5 - 1.9 mmol/L   CT CHEST ABDOMEN PELVIS W CONTRAST  Result Date: 05/17/2023 CLINICAL DATA:  Shortness of breath, abdominal pain, vomiting, diarrhea. Weakness. COVID. EXAM: CT CHEST, ABDOMEN, AND PELVIS WITH CONTRAST TECHNIQUE: Multidetector CT imaging of the chest, abdomen and pelvis was performed following the standard protocol during bolus administration of intravenous contrast. RADIATION DOSE REDUCTION: This exam was performed according to the departmental dose-optimization program which includes automated exposure control, adjustment of the mA and/or kV according to patient size and/or use of iterative reconstruction technique. CONTRAST:  60mL OMNIPAQUE IOHEXOL 350 MG/ML SOLN COMPARISON:  05/11/2023 CT abdomen and pelvis.  Chest CT 01/14/2022. FINDINGS: CT CHEST FINDINGS Cardiovascular: Heart is normal size. Aorta is normal caliber. Moderate coronary artery and aortic calcifications. Mediastinum/Nodes: No mediastinal, hilar, or axillary adenopathy. Trachea and esophagus are unremarkable. Thyroid unremarkable. Lungs/Pleura: Lungs are clear. No focal airspace opacities or suspicious nodules. No effusions. Musculoskeletal: Chest wall soft tissues are unremarkable. No acute bony abnormality. CT ABDOMEN PELVIS FINDINGS Hepatobiliary: No focal liver abnormality  is seen. Status post cholecystectomy. No biliary dilatation. Pancreas: No focal abnormality or ductal dilatation. Spleen: No focal abnormality.  Normal size. Adrenals/Urinary Tract: No adrenal abnormality. No focal renal abnormality. No stones or hydronephrosis. Bladder decompressed. Stomach/Bowel: Diffuse colonic diverticulosis. No active diverticulitis. Stomach and small bowel decompressed, unremarkable. Vascular/Lymphatic: Aortoiliac atherosclerosis. No evidence of aneurysm or adenopathy. Reproductive: Prior hysterectomy.  No adnexal masses. Other: No free fluid or free air. Musculoskeletal: No acute bony abnormality. IMPRESSION: No acute findings in the chest, abdomen or pelvis. Coronary artery disease, aortic atherosclerosis. Diffuse colonic diverticulosis. Electronically Signed   By: Charlett Nose M.D.   On: 05/17/2023 22:30   DG Chest Portable 1 View  Result Date: 05/17/2023 CLINICAL DATA:  Weakness, COVID positive. EXAM: PORTABLE CHEST 1 VIEW COMPARISON:  Chest radiograph dated 05/11/2023. FINDINGS: The heart size and mediastinal contours are within normal limits. Vascular calcifications are seen in the aortic arch. Both lungs are clear. Degenerative changes are seen in the spine. IMPRESSION: No active cardiopulmonary disease. Electronically Signed   By: Romona Curls M.D.   On: 05/17/2023 19:38    Pending Labs Unresulted Labs (From admission, onward)    None       Vitals/Pain Today's Vitals   05/18/23 0130 05/18/23 0200 05/18/23 0230 05/18/23 0300  BP: (!) 129/53 (!) 129/47 (!) 122/49 (!) 118/53  Pulse: 82 76 85 73  Resp: (!) 24 16 15 15   Temp:      TempSrc:      SpO2: 99% 97% 97% 96%  Weight:      Height:      PainSc:        Isolation Precautions No active isolations  Medications Medications  lactated ringers bolus 1,000 mL (0 mLs Intravenous Stopped 05/18/23 0000)  ondansetron [facility]) injection 4 mg (4 mg Intravenous Given 05/17/23 2024)  cefTRIAXone (ROCEPHIN) 1 g in sodium  chloride 0.9 % 100 mL IVPB (0 g Intravenous Stopped 05/17/23 2310)  iohexol (OMNIPAQUE) 350 MG/ML injection 75 mL (60 mLs Intravenous Contrast Given 05/17/23 2225)    Mobility walks with person assist     Focused Assessments     R Recommendations: See Admitting Provider Note  Report given to:   Additional Notes: Pt came in with weakness and found out she had covid like a week ago. She has also had diarrhea for like 2 days. She has a 20 G in R AC. Her first 2 lactics were elevated at 2.3 and 2.5. The lastest one is 0.9. She got 1L of LR and rocephin. Family at bedside.

## 2023-05-18 NOTE — Progress Notes (Signed)
Elizabeth Mcintyre  ZOX:[id number] DOB: 1940-11-24 DOA: 05/17/2023 PCP: Anabel Halon, MD    Brief Narrative:  82 year old with a history of HTN, HLD, asthma, GERD, chronic diarrhea, hypothyroidism, CKD stage IIIa, marginal zone lymphoma, prediabetes, Alzheimer's dementia, RLS, and hepatic steatosis who presented to the ER 9/1 with generalized abdominal pain nausea vomiting and diarrhea.  She suffered an episode of orthostatic syncope on the day of her admission.  She did not fall to the ground as her son supported her and she rapidly regained consciousness.  She was diagnosed with COVID 8/23 and treated with Paxlovid.  In the ER UA was consistent with UTI.  CT abdomen/pelvis/chest was without acute findings.  Goals of Care:   Code Status: Full Code   DVT prophylaxis: heparin injection 5,000 Units Start: 05/18/23 0600  Interim Hx: Afebrile.  Vital signs stable since admission.  Oxygen saturation 100% on room air.  Resting comfortably at the time my visit.  No new complaints.  Assessment & Plan:  UTI Likely the source of her presenting symptoms -continue empiric therapy and follow culture  Generalized abdominal pain nausea and vomiting Likely a consequence of her UTI -CT abdomen and pelvis without acute findings  Acute kidney injury on CKD stage III yea Most consistent with prerenal azotemia -baseline creatinine approximately 1.2 with creatinine 1.7 at presentation -no evidence of obstructive uropathy on CT abdomen at presentation -creatinine improving with hydration already  Chronic intermittent diarrhea with acute exacerbation Patient began to experience a flareup during her recent COVID infection -no evidence of colitis/complicating factors on CT abdomen and pelvis  Orthostatic syncope Hydrate gently and then evaluate with PT/OT  Recent COVID infection - resolved Positive test was 05/08/2023 -status post treatment with Paxlovid -likely contributed to her dehydration -CT chest without  evidence of infiltrate  Hypokalemia Due to increased GI loss -supplement and follow  Hypomagnesemia Due to GI loss -supplement and follow  QT prolongation Likely simply due to electrolyte abnormalities -reassess  Hypothyroidism Continue usual home Synthroid dose -TSH at goal at 1.189  GERD Continue home Protonix  Marginal zone lymphoma Has completed treatment and is currently on observation -follow-up with oncology in outpatient setting as previously established  Alzheimer's dementia Continue her usual home medications  Restless leg syndrome Continue usual Requip dose  Family Communication: No family present at time of exam Disposition: Anticipate eventual discharge home   Objective: Blood pressure (!) 124/46, pulse 83, temperature 98.3 F (36.8 C), temperature source Oral, resp. rate 20, height 5\' 2"  (1.575 m), weight 60.1 kg, SpO2 100%.  Intake/Output Summary (Last 24 hours) at 05/18/2023 0802 Last data filed at 05/18/2023 0646 Gross per 24 hour  Intake 1097.58 ml  Output 0 ml  Net 1097.58 ml   Filed Weights   05/17/23 1908 05/18/23 0421  Weight: 64 kg 60.1 kg    Examination: General: No acute respiratory distress Lungs: Clear to auscultation bilaterally without wheezes or crackles Cardiovascular: Regular rate and rhythm without murmur gallop or rub normal S1 and S2 Abdomen: Nontender, nondistended, soft, bowel sounds positive, no rebound, no ascites, no appreciable mass Extremities: No significant cyanosis, clubbing, or edema bilateral lower extremities  CBC: Recent Labs  Lab 05/17/23 2016  WBC 11.1*  NEUTROABS 9.2*  HGB 14.5  HCT 42.8  MCV 84.1  PLT 189   Basic Metabolic Panel: Recent Labs  Lab 05/17/23 2016 05/18/23 0549  NA 138 142  K 3.0* 3.0*  CL 102 105  CO2 20* 24  GLUCOSE 158* 101*  BUN 18 14  CREATININE 1.72* 1.40*  CALCIUM 9.1 8.8*  MG  --  1.5*   GFR: Estimated Creatinine Clearance: 24.9 mL/min (A) (by C-G formula based on SCr  of 1.4 mg/dL (H)).   Scheduled Meds:  donepezil  10 mg Oral Daily   gabapentin  100 mg Oral Daily   heparin  5,000 Units Subcutaneous Q8H   levothyroxine  75 mcg Oral Q0600   mirtazapine  7.5 mg Oral QHS   pantoprazole  40 mg Oral Daily   rOPINIRole  0.25 mg Oral QHS   Continuous Infusions:  sodium chloride 125 mL/hr at 05/18/23 0641   cefTRIAXone (ROCEPHIN)  IV     potassium chloride 10 mEq (05/18/23 0642)     LOS: 0 days   Lonia Blood, MD [facility] Office  [phone] Pager - Text Page per Loretha Stapler  If 7PM-7AM, please contact night-coverage per Amion 05/18/2023, 8:02 AM

## 2023-05-18 NOTE — Progress Notes (Signed)
New Admission Note:   Arrival Method: Via Stretcher From ED Mental Orientation:  A & Ox4 - Short Term Memory Telemetry: Box 5M22 - NSR Assessment: Completed Skin:  WNL IV:  RT AC Pain: C/O Headache 5/10 Tubes:  None Safety Measures: Safety Fall Prevention Plan has been given, discussed and signed Admission: Completed 5 MW Orientation: Patient has been orientated to the room, unit and staff.  Family:  2 Daughters at Bedside  No personal belongings are at the bedside.  Patient is from home with her son.  She did fall and pass out at home on the day of admission.  She left her reading glasses at home.  Patient and family understand that the patient is considered to be a high fall risk.  They signed the Fall Contract and are in agreement to the Fall Prevention Protocol.  All questions answered.  Orders have been reviewed and implemented. Will continue to monitor the patient. Call light has been placed within reach and bed alarm has been activated.   Bernie Covey RN Phone number: [phone]

## 2023-05-18 NOTE — ED Provider Notes (Signed)
  Provider Note MRN:  [id number]  Arrival date & time: 05/18/23    ED Course and Medical Decision Making  Assumed care from Dr. Anitra Lauth at shift change.  Admitted to hospitalist service for acute kidney injury in the setting of dehydration, nausea vomiting diarrhea.  Procedures  Final Clinical Impressions(s) / ED Diagnoses     ICD-10-CM   1. Bilious vomiting with nausea  R11.14     2. Generalized weakness  R53.1     3. Generalized abdominal pain  R10.84     4. Dehydration  E86.0     5. AKI (acute kidney injury) (HCC)  N17.9       ED Discharge Orders     None       Discharge Instructions   None     Elmer Sow. Pilar Plate, MD [facility] [email or web address]    Sabas Sous, MD 05/18/23 [phone]

## 2023-05-19 DIAGNOSIS — N179 Acute kidney failure, unspecified: Secondary | ICD-10-CM | POA: Diagnosis not present

## 2023-05-19 LAB — CBC
HCT: 41 % (ref 36.0–46.0)
Hemoglobin: 13.7 g/dL (ref 12.0–15.0)
MCH: 28.8 pg (ref 26.0–34.0)
MCHC: 33.4 g/dL (ref 30.0–36.0)
MCV: 86.3 fL (ref 80.0–100.0)
Platelets: 151 10*3/uL (ref 150–400)
RBC: 4.75 MIL/uL (ref 3.87–5.11)
RDW: 13.2 % (ref 11.5–15.5)
WBC: 6.5 10*3/uL (ref 4.0–10.5)
nRBC: 0 % (ref 0.0–0.2)

## 2023-05-19 LAB — COMPREHENSIVE METABOLIC PANEL
ALT: 27 U/L (ref 0–44)
AST: 25 U/L (ref 15–41)
Albumin: 3.4 g/dL — ABNORMAL LOW (ref 3.5–5.0)
Alkaline Phosphatase: 81 U/L (ref 38–126)
Anion gap: 12 (ref 5–15)
BUN: 6 mg/dL — ABNORMAL LOW (ref 8–23)
CO2: 19 mmol/L — ABNORMAL LOW (ref 22–32)
Calcium: 8.4 mg/dL — ABNORMAL LOW (ref 8.9–10.3)
Chloride: 110 mmol/L (ref 98–111)
Creatinine, Ser: 1.22 mg/dL — ABNORMAL HIGH (ref 0.44–1.00)
GFR, Estimated: 45 mL/min — ABNORMAL LOW (ref >60)
Glucose, Bld: 127 mg/dL — ABNORMAL HIGH (ref 70–99)
Potassium: 3.5 mmol/L (ref 3.5–5.1)
Sodium: 141 mmol/L (ref 135–145)
Total Bilirubin: 0.5 mg/dL (ref 0.3–1.2)
Total Protein: 5.8 g/dL — ABNORMAL LOW (ref 6.5–8.1)

## 2023-05-19 LAB — URINE CULTURE: Culture: NO GROWTH

## 2023-05-19 LAB — GLUCOSE, CAPILLARY: Glucose-Capillary: 109 mg/dL — ABNORMAL HIGH (ref 70–99)

## 2023-05-19 LAB — PHOSPHORUS: Phosphorus: 2.2 mg/dL — ABNORMAL LOW (ref 2.5–4.6)

## 2023-05-19 LAB — MAGNESIUM: Magnesium: 2.1 mg/dL (ref 1.7–2.4)

## 2023-05-19 MED ORDER — POTASSIUM CHLORIDE CRYS ER 20 MEQ PO TBCR
40.0000 meq | EXTENDED_RELEASE_TABLET | Freq: Once | ORAL | Status: AC
  Administered 2023-05-19: 40 meq via ORAL
  Filled 2023-05-19: qty 2

## 2023-05-19 NOTE — Plan of Care (Signed)
  Problem: Health Behavior/Discharge Planning: Goal: Ability to manage health-related needs will improve Outcome: Progressing   

## 2023-05-19 NOTE — TOC CM/SW Note (Signed)
Transition of Care [facility]) - Inpatient Brief Assessment   Patient Details  Name: Elizabeth Mcintyre MRN: [id number] Date of Birth: 12/26/1940  [facility]) CM/SW Contact:    Tom-Johnson, Hershal Coria, RN Phone Number: 05/19/2023, 2:38 PM   Clinical Narrative:  Patient presented to the ED with N/V, SOB,  Chest Pain, Diarrhea and Abdominal Pain. Admitted with AKI. Recent Covid+.  On IV abx.   From home with son, has three supportive children. Retired, does not drive, son transports to and from appointments. Has Handrails in bathroom.  PCP is Anabel Halon, MD and uses [facility] on [address] in [place].   No PT/OT f/u noted.  CM will continue to follow as patient progresses with care towards discharge.          Transition of Care Asessment: Insurance and Status: Insurance coverage has been reviewed Patient has primary care physician: Yes Home environment has been reviewed: Yes Prior level of function:: Independent Prior/Current Home Services: No current home services Social Determinants of Health Reivew: SDOH reviewed no interventions necessary Readmission risk has been reviewed: Yes Transition of care needs: no transition of care needs at this time

## 2023-05-19 NOTE — Progress Notes (Signed)
Elizabeth Mcintyre  GNF:[id number] DOB: Apr 07, 1941 DOA: 05/17/2023 PCP: Anabel Halon, MD    Brief Narrative:  82 year old with a history of HTN, HLD, asthma, GERD, chronic diarrhea, hypothyroidism, CKD stage IIIa, marginal zone lymphoma, prediabetes, Alzheimer's dementia, RLS, and hepatic steatosis who presented to the ER 9/1 with generalized abdominal pain nausea vomiting and diarrhea.  She suffered an episode of orthostatic syncope on the day of her admission.  She did not fall to the ground as her son supported her and she rapidly regained consciousness.  She was diagnosed with COVID 05/17/23 and treated with Paxlovid.  In the ER UA was consistent with UTI.  CT abdomen/pelvis/chest was without acute findings.  Goals of Care:   Code Status: Full Code   DVT prophylaxis: heparin injection 5,000 Units Start: 05/18/23 0600  Interim Hx: No acute events recorded overnight.  Patient did well with both PT and OT and it is felt that she will likely be stable for return home.  Afebrile.  Vital signs stable.  No new complaints at the time of visit.  Feels that she is improving overall.  Appetite improving as his intake.  No more vomiting with nausea much improved.  Assessment & Plan:  UTI Likely the source of her presenting symptoms -continue empiric therapy -culture unrevealing thus far -WBC has normalized  Generalized abdominal pain with nausea and vomiting Likely a consequence of her UTI -CT abdomen and pelvis without acute findings  Acute kidney injury on CKD stage III yea Most consistent with prerenal azotemia -baseline creatinine approximately 1.2 with creatinine 1.7 at presentation -no evidence of obstructive uropathy on CT abdomen at presentation -creatinine has returned to her baseline with hydration  Chronic intermittent diarrhea with acute exacerbation Patient began to experience a flareup during her recent COVID infection -no evidence of colitis/complicating factors on CT abdomen and  pelvis  Orthostatic syncope Improving with volume expansion  Recent COVID infection - resolved Positive test was 2023/05/17 -status post treatment with Paxlovid -likely contributed to her dehydration -CT chest without evidence of infiltrate  Hypokalemia Due to increased GI loss - improved with supplementation  Hypomagnesemia Due to GI loss - corrected with supplementation  QT prolongation Likely simply due to electrolyte abnormalities -QT has normalized with replacement of potassium and magnesium  Hypothyroidism Continue usual home Synthroid dose -TSH at goal at 1.189  GERD Continue home Protonix  Marginal zone lymphoma Has completed treatment and is currently on observation -follow-up with oncology in outpatient setting as previously established  Alzheimer's dementia Continue her usual home medications  Restless leg syndrome Continue usual Requip dose  Family Communication: Spoke with family at bedside Disposition: Anticipate eventual discharge home -hopefully within 24-48 hours if tolerates advancement of activity   Objective: Blood pressure 118/66, pulse 78, temperature 98 F (36.7 C), temperature source Oral, resp. rate 20, height 5\' 2"  (1.575 m), weight 60.1 kg, SpO2 95%.  Intake/Output Summary (Last 24 hours) at 05/19/2023 1017 Last data filed at 05/19/2023 0900 Gross per 24 hour  Intake 1849.44 ml  Output 0 ml  Net 1849.44 ml   Filed Weights   05/17/23 1908 05/18/23 0421  Weight: 64 kg 60.1 kg    Examination: General: No acute respiratory distress Lungs: Clear to auscultation bilaterally -no wheezing Cardiovascular: Regular rate and rhythm without murmur gallop or rub normal S1 and S2 Abdomen: Mildly tender diffusely with no focal tenderness and no mass, bowel sounds positive, no rebound Extremities: No significant cyanosis, clubbing, or edema bilateral lower extremities  CBC: Recent Labs  Lab 05/17/23 2016 05/19/23 0559  WBC 11.1* 6.5  NEUTROABS  9.2*  --   HGB 14.5 13.7  HCT 42.8 41.0  MCV 84.1 86.3  PLT 189 151   Basic Metabolic Panel: Recent Labs  Lab 05/17/23 2016 05/18/23 0549 05/19/23 0559  NA 138 142 141  K 3.0* 3.0* 3.5  CL 102 105 110  CO2 20* 24 19*  GLUCOSE 158* 101* 127*  BUN 18 14 6*  CREATININE 1.72* 1.40* 1.22*  CALCIUM 9.1 8.8* 8.4*  MG  --  1.5* 2.1  PHOS  --   --  2.2*   GFR: Estimated Creatinine Clearance: 28.6 mL/min (A) (by C-G formula based on SCr of 1.22 mg/dL (H)).   Scheduled Meds:  cyanocobalamin  1,000 mcg Oral Daily   donepezil  10 mg Oral Daily   heparin  5,000 Units Subcutaneous Q8H   levothyroxine  75 mcg Oral Q0600   mirtazapine  7.5 mg Oral QHS   pantoprazole  40 mg Oral Daily   rOPINIRole  0.25 mg Oral QHS   Continuous Infusions:  cefTRIAXone (ROCEPHIN)  IV 2 g (05/18/23 2130)   dextrose 5 % and 0.9 % NaCl with KCl 20 mEq/L 75 mL/hr at 05/19/23 0043     LOS: 1 day   Lonia Blood, MD [facility] Office  [phone] Pager - Text Page per Loretha Stapler  If 7PM-7AM, please contact night-coverage per Amion 05/19/2023, 10:17 AM

## 2023-05-20 ENCOUNTER — Encounter (HOSPITAL_COMMUNITY): Payer: Self-pay | Admitting: Emergency Medicine

## 2023-05-20 DIAGNOSIS — N179 Acute kidney failure, unspecified: Secondary | ICD-10-CM | POA: Diagnosis not present

## 2023-05-20 LAB — BASIC METABOLIC PANEL
Anion gap: 9 (ref 5–15)
BUN: <5 mg/dL — ABNORMAL LOW (ref 8–23)
CO2: 21 mmol/L — ABNORMAL LOW (ref 22–32)
Calcium: 8.6 mg/dL — ABNORMAL LOW (ref 8.9–10.3)
Chloride: 112 mmol/L — ABNORMAL HIGH (ref 98–111)
Creatinine, Ser: 1.14 mg/dL — ABNORMAL HIGH (ref 0.44–1.00)
GFR, Estimated: 48 mL/min — ABNORMAL LOW (ref >60)
Glucose, Bld: 87 mg/dL (ref 70–99)
Potassium: 3.8 mmol/L (ref 3.5–5.1)
Sodium: 142 mmol/L (ref 135–145)

## 2023-05-20 MED ORDER — GABAPENTIN 100 MG PO CAPS: 100.0000 mg | ORAL_CAPSULE | Freq: Every day | ORAL | 0 refills | Status: DC

## 2023-05-20 MED ORDER — MIRTAZAPINE 7.5 MG PO TABS: 15.0000 mg | ORAL_TABLET | Freq: Every day | ORAL | 0 refills | Status: DC

## 2023-05-20 MED ORDER — LOPERAMIDE HCL 2 MG PO CAPS
2.0000 mg | ORAL_CAPSULE | ORAL | Status: DC | PRN
  Administered 2023-05-20: 2 mg via ORAL
  Filled 2023-05-20: qty 1

## 2023-05-20 NOTE — Progress Notes (Signed)
Physical Therapy Treatment Patient Details Name: Elizabeth Mcintyre MRN: [id number] DOB: 1941/04/15 Today's Date: 05/20/2023   History of Present Illness 82 y.o. female presented to the ED 05/17/23 complaining of generalized weakness, chest pain, shortness of breath, abdominal pain, nausea, vomiting, diarrhea, and syncope. Recent COVID May 15, 2023). +AKI, troponin negative, UTI. Pt negative for orthostatic hypotension in PT session 9/4. PMH significant of hypertension, hyperlipidemia, asthma, GERD, hypothyroidism, CKD stage IIIa, marginal zone lymphoma, prediabetes, RLS, Alzheimer's dementia, hepatic steatosis    PT Comments  Pt received in supine, agreeable to therapy session, daughter present and encouraging, pt and daughter receptive to instruction and agreeable to gait belt to take home given pt c/o persistent mild dizziness. BP (MAP) stable, see below, reviewed supine/seated UE/LE exercises and pre-gait tasks for improved hemodynamic stability prior to gait trial which appeared to slightly decrease symptom severity. Pt and daughter agreeable to obtain compression socks after DC to see if this helps decrease symptoms. Pt continues to benefit from PT services to progress toward functional mobility goals, pt has DME at home and recommend frequent to constant initial supervision/assist for pt safety given c/o mild dizziness still when sitting/standing. Pt scored 19/24 on Dynamic Gait Index (using RW for stability) which indicates no increased fall risk for older community dwelling adults, anticipate score would be decreased without AD, pt agreeable to use rollator at home. Could consider OP vestibular PT follow-up if symptoms do not improve in next few days, pt agreeable to follow up with MD.   Vital Signs  Patient Position (if appropriate) Orthostatic Vitals  Orthostatic Lying   BP- Lying 142/59 (82)  Pulse- Lying 75  Orthostatic Sitting  BP- Sitting 126/73 (84)  Pulse- Sitting 89  Orthostatic Standing at 0  minutes  BP- Standing at 0 minutes 121/60 (79)  Pulse- Standing at 0 minutes 99  Orthostatic Standing at 3 minutes  BP- Standing at 3 minutes 122/64 (79)  Pulse- Standing at 3 minutes 99     If plan is discharge home, recommend the following: A little help with walking and/or transfers;Assistance with cooking/housework;Direct supervision/assist for medications management;Direct supervision/assist for financial management;Assist for transportation;Help with stairs or ramp for entrance;Supervision due to cognitive status   Can travel by private vehicle        Equipment Recommendations  None recommended by PT;Other (comment) (pt has rollator at home she can use per daughter)    Recommendations for Other Services       Precautions / Restrictions Precautions Precautions: Fall;Other (comment) Precaution Comments: recent syncopal episode Restrictions Weight Bearing Restrictions: No     Mobility  Bed Mobility Overal bed mobility: Needs Assistance Bed Mobility: Supine to Sit     Supine to sit: Modified independent (Device/Increase time)     General bed mobility comments: from flat bed, no rails, increased time    Transfers Overall transfer level: Needs assistance Equipment used: None, Rolling walker (2 wheels) Transfers: Sit to/from Stand Sit to Stand: Contact guard assist           General transfer comment: CGA for safety due to mild dizziness, no overt LOB/buckling with single UE support upon standing.    Ambulation/Gait Ambulation/Gait assistance: Supervision Gait Distance (Feet): 300 Feet (357ft with RW in hall, ~77ft in room no AD and stand-by for safety) Assistive device: Rolling walker (2 wheels) Gait Pattern/deviations: WFL(Within Functional Limits) Gait velocity: variable, grossly 0.4-0.6 m/s     General Gait Details: Mild c/o dizziness throughout, BP stable prior to ambulation, pt reports more confidence  to progress gait distance using RW and daughter  states they have rollator at home. PTA reviewed importance of using rollator brakes pre/post standing for safety, no rollator in room at time of session so used RW for gait trial.   Stairs Stairs: Yes Stairs assistance: Supervision Stair Management: One rail Right, Alternating pattern, Step to pattern, Forwards Number of Stairs: 3 General stair comments: pt states she has bil rails to get onto porch but used one rail with reciprocal pattern ascending and step-to vs reciprocal pattern descending.  Encouraged step-to pattern descending at home for safety given recent dizziness   Wheelchair Mobility     Tilt Bed    Modified Rankin (Stroke Patients Only)       Balance Overall balance assessment: Mild deficits observed, not formally tested, Modified Independent (mild instability when not using AD; modI with RW)                               Standardized Balance Assessment Standardized Balance Assessment : Dynamic Gait Index   Dynamic Gait Index Level Surface: Mild Impairment Change in Gait Speed: Mild Impairment Gait with Horizontal Head Turns: Normal Gait with Vertical Head Turns: Normal Gait and Pivot Turn: Normal Step Over Obstacle: Mild Impairment Step Around Obstacles: Mild Impairment Steps: Mild Impairment Total Score: 19      Cognition Arousal: Alert Behavior During Therapy: WFL for tasks assessed/performed Overall Cognitive Status: History of cognitive impairments - at baseline                                 General Comments: Pt A&O, following 1 and some 2-step commands well today. Pt's daughter present and agrees she plans to have her DC to her home initially, they have rollator she can use, pt reports more confidence with AD use today.        Exercises Other Exercises Other Exercises: standing hip flexion x10 reps ea (RW support) Other Exercises: standing BUE AROM (shoulder/elbow flex/ext) x20 reps ea unsupported prior to second BP  reading for improved hemodynamics    General Comments General comments (skin integrity, edema, etc.): see orthostatic BP in comments above; SpO2 WFL on RA, no acute s/sx distress other than continued c/o dizziness although BP MAP stable. Pt/daughter notified she could try compression socks at home if symptoms persist to assist with hemodynamic stability.      Pertinent Vitals/Pain Pain Assessment Pain Assessment: Faces Faces Pain Scale: Hurts little more Pain Location: abdomen when sitting up Pain Descriptors / Indicators: Grimacing, Guarding Pain Intervention(s): Monitored during session, Repositioned     PT Goals (current goals can now be found in the care plan section) Acute Rehab PT Goals Patient Stated Goal: stomach pain to go away, less dizzy PT Goal Formulation: With patient Time For Goal Achievement: 06/01/23 Progress towards PT goals: Progressing toward goals    Frequency    Min 1X/week      PT Plan         AM-PAC PT "6 Clicks" Mobility   Outcome Measure  Help needed turning from your back to your side while in a flat bed without using bedrails?: None Help needed moving from lying on your back to sitting on the side of a flat bed without using bedrails?: None Help needed moving to and from a bed to a chair (including a wheelchair)?: A Little Help needed standing  up from a chair using your arms (e.g., wheelchair or bedside chair)?: A Little Help needed to walk in hospital room?: A Little Help needed climbing 3-5 steps with a railing? : A Little 6 Click Score: 20    End of Session Equipment Utilized During Treatment: Gait belt Activity Tolerance: Patient tolerated treatment well;Other (comment) (c/o dizziness but able to progress standing fairly well with AD) Patient left: in chair;with call bell/phone within reach;with nursing/sitter in room;with family/visitor present (daughter and RN in room) Nurse Communication: Mobility status;Other (comment) (BP stable,  pt still dizzy mildly) PT Visit Diagnosis: Unsteadiness on feet (R26.81);Dizziness and giddiness (R42)     Time: 1610-9604 PT Time Calculation (min) (ACUTE ONLY): 18 min  Charges:    $Gait Training: 8-22 mins PT General Charges $$ ACUTE PT VISIT: 1 Visit                     Ralph Brouwer P., PTA Acute Rehabilitation Services Secure Chat Preferred 9a-5:30pm Office: [phone]    Dorathy Kinsman [facility] 05/20/2023, 3:05 PM

## 2023-05-20 NOTE — TOC Transition Note (Signed)
Transition of Care [facility]) - CM/SW Discharge Note   Patient Details  Name: Elizabeth Mcintyre MRN: [id number] Date of Birth: [birth year]  Transition of Care [facility]) CM/SW Contact:  Tom-Johnson, Hershal Coria, RN Phone Number: 05/20/2023, 2:01 PM   Clinical Narrative:     Patient is scheduled for discharge today.  Readmission Risk Assessment done. Home health referral sent to [facility] and patient has no preference, Kandee Keen noted acceptance, info on AVS. Hospital f/u and discharge instructions on AVS. Daughter, [name] to transport at discharge.  No further TOC needs noted.      Final next level of care: Home w Home Health Services Barriers to Discharge: Barriers Resolved   Patient Goals and CMS Choice CMS Medicare.gov Compare Post Acute Care list provided to:: Patient Choice offered to / list presented to : Patient, Adult Children (Daughter, [name])  Discharge Placement                  Patient to be transferred to facility by: Daughter Name of family member notified: [name]    Discharge Plan and Services Additional resources added to the After Visit Summary for                  DME Arranged: N/A DME Agency: NA       HH Arranged: PT, Nurse's Aide HH Agency: [facility] Date [facility] Agency Contacted: 05/20/23 Time HH Agency Contacted: [id number] Representative spoke with at [facility] Agency: Kandee Keen  Social Determinants of Health (SDOH) Interventions SDOH Screenings   Food Insecurity: No Food Insecurity (05/18/2023)  Housing: Low Risk  (05/18/2023)  Transportation Needs: No Transportation Needs (05/18/2023)  Utilities: Not At Risk (05/18/2023)  Tobacco Use: Low Risk  (05/18/2023)     Readmission Risk Interventions    05/19/2023    2:38 PM  Readmission Risk Prevention Plan  Post Dischage Appt Complete  Medication Screening Complete  Transportation Screening Complete

## 2023-05-20 NOTE — Discharge Summary (Signed)
Physician Discharge Summary  Elizabeth Mcintyre ZOX:[id number] DOB: July 08, 1941 DOA: 05/17/2023  PCP: Elizabeth Halon, MD  Admit date: 05/17/2023 Discharge date: 05/20/2023  Time spent: 40 minutes  Recommendations for Outpatient Follow-up:  Follow outpatient CBC/CMP  Poor PO intake, needs follow up with PCP, trial of remeron GI follow up for chronic diarrhea  Amlodipine on hold with orthostatic hypotension at presentation, follow with PCP outpatient    Discharge Diagnoses:  Principal Problem:   AKI (acute kidney injury) (HCC) Active Problems:   Nausea and vomiting   Diarrhea   Chest pain   UTI (urinary tract infection)   Hypokalemia   QT prolongation   Hypothyroidism   Syncope   Discharge Condition: stable  Diet recommendation: heart healthy  Filed Weights   05/17/23 1908 05/18/23 0421  Weight: 64 kg 60.1 kg    History of present illness:   82 year old with Elizabeth Mcintyre history of HTN, HLD, asthma, GERD, chronic diarrhea, hypothyroidism, CKD stage IIIa, marginal zone lymphoma, prediabetes, Alzheimer's dementia, RLS, and hepatic steatosis who presented to the ER 9/1 with generalized abdominal pain nausea vomiting and diarrhea. She suffered an episode of orthostatic syncope on the day of her admission. She did not fall to the ground as her son supported her and she rapidly regained consciousness. She was diagnosed with COVID May 23, 2023 and treated with Paxlovid. In the ER UA was concerning for Elizabeth Mcintyre UTI, initially treated for UTI, but urine culture returned negative. CT abdomen/pelvis/chest was without acute findings.   She's now improved.  Plan for outpatient follow up of appetite and chronic diarrhea.   Hospital Course:  Assessment and Plan:  Generalized abdominal pain with nausea and vomiting  Poor Appetite Related to paxlovid vs viral illness or other causes - seems improved, stable for discharge Remeron, needs outpatient follow up  Pyuria  Urine culture negative, doubt this contributed to  above  Acute kidney injury on CKD stage III yea Creatinine appears to be at baseline   Chronic intermittent diarrhea with acute exacerbation Patient began to experience Elizabeth Mcintyre flareup during her recent COVID infection -no evidence of colitis/complicating factors on CT abdomen and pelvis This seems chronic over years, follow with outpatient GI   Orthostatic syncope Improved - hold amlodipine until outpatient follow up   Recent COVID infection - resolved Positive test was 05-23-2023 -status post treatment with Paxlovid -likely contributed to her dehydration -CT chest without evidence of infiltrate   Hypokalemia Due to increased GI loss - improved with supplementation   Hypomagnesemia Due to GI loss - corrected with supplementation   QT prolongation Likely due to electrolyte abnormalities -QT has normalized with replacement of potassium and magnesium   Hypothyroidism Continue usual home Synthroid dose -TSH at goal at 1.189   GERD Continue home Protonix   Marginal zone lymphoma Has completed treatment and is currently on observation -follow-up with oncology in outpatient setting as previously established   Alzheimer's dementia Continue her usual home medications   Restless leg syndrome Continue usual Requip dose      Procedures: none   Consultations: none  Discharge Exam: Vitals:   05/20/23 0511 05/20/23 0855  BP: (!) 146/51 124/61  Pulse: 68 63  Resp:  19  Temp: 97.7 F (36.5 C) 97.9 F (36.6 C)  SpO2: 96% 99%   Chronic diarrhea Poor appetite (at least the past month) Wants to go home Daughter at bedside  General: No acute distress. Cardiovascular: RRR Lungs: unlabored Abdomen: Soft, nontender, nondistended  Neurological: Alert and oriented 3. Moves all  extremities 4 with equal strength. Cranial nerves II through XII grossly intact. Extremities: No clubbing or cyanosis. No edema.   Discharge Instructions   Discharge Instructions     Call MD for:   difficulty breathing, headache or visual disturbances   Complete by: As directed    Call MD for:  extreme fatigue   Complete by: As directed    Call MD for:  hives   Complete by: As directed    Call MD for:  persistant dizziness or light-headedness   Complete by: As directed    Call MD for:  persistant nausea and vomiting   Complete by: As directed    Call MD for:  redness, tenderness, or signs of infection (pain, swelling, redness, odor or green/yellow discharge around incision site)   Complete by: As directed    Call MD for:  severe uncontrolled pain   Complete by: As directed    Call MD for:  temperature >100.4   Complete by: As directed    Diet - low sodium heart healthy   Complete by: As directed    Discharge instructions   Complete by: As directed    You were seen for an episode of syncope (fainting) in the setting of nausea, vomiting, and abdominal pain.  We thinking the fainting episode was due to dehydration from your symptoms.  It's not immediately clear what led to your gastrointestinal symptoms.  You were treated initially for Elizabeth Mcintyre UTI, but your culture was not suggestive of this.  We've stopped your antibiotics.  It's possible your symptoms could have been related to paxlovid or another infection or stomach bug.  You should follow up with your PCP regarding your poor appetite.  We've increased your remeron to 15 mg nightly, but you lack of appetite should ben investigated further with your PCP.   Your chronic diarrhea should be followed with your primary gastroenterologist as an outpatient.  Continue to treat as needed with imodium.  I think the plan to return home with your daughter Elizabeth Mcintyre is Elizabeth Mcintyre good one.  You'll benefit from assistance with bathing and grooming and with meals.    Return for new, recurrent, or worsening symptoms.  Please ask your PCP to request records from this hospitalization so they know what was done and what the next steps will be.   Increase activity  slowly   Complete by: As directed       Allergies as of 05/20/2023       Reactions   Codeine Nausea And Vomiting, Rash        Medication List     STOP taking these medications    amLODipine 10 MG tablet Commonly known as: NORVASC       TAKE these medications    albuterol 108 (90 Base) MCG/ACT inhaler Commonly known as: VENTOLIN HFA Inhale 2 puffs into the lungs every 6 (six) hours as needed.   ASCORBIC ACID PO Take 1 tablet by mouth daily.   donepezil 10 MG tablet Commonly known as: ARICEPT Take 10 mg by mouth daily.   gabapentin 100 MG capsule Commonly known as: NEURONTIN Take 100 mg by mouth daily.   levothyroxine 75 MCG tablet Commonly known as: SYNTHROID Take 75 mcg by mouth daily.   mirtazapine 7.5 MG tablet Commonly known as: REMERON Take 2 tablets (15 mg total) by mouth at bedtime. Follow with your outpatient provider for further refills and adjustments What changed:  how much to take additional instructions   pantoprazole 40 MG  tablet Commonly known as: PROTONIX Take 40 mg by mouth daily.   rOPINIRole 0.25 MG tablet Commonly known as: REQUIP Take 0.25 mg by mouth at bedtime.   rosuvastatin 20 MG tablet Commonly known as: CRESTOR Take 20 mg by mouth daily.   sucralfate 1 g tablet Commonly known as: CARAFATE Take 1 g by mouth 4 (four) times daily.   VITAMIN B12 PO Take 1 tablet by mouth daily.   VITAMIN D3 PO Take 1 tablet by mouth daily.       Allergies  Allergen Reactions   Codeine Nausea And Vomiting and Rash      The results of significant diagnostics from this hospitalization (including imaging, microbiology, ancillary and laboratory) are listed below for reference.    Significant Diagnostic Studies: CT CHEST ABDOMEN PELVIS W CONTRAST  Result Date: 05/17/2023 CLINICAL DATA:  Shortness of breath, abdominal pain, vomiting, diarrhea. Weakness. COVID. EXAM: CT CHEST, ABDOMEN, AND PELVIS WITH CONTRAST TECHNIQUE: Multidetector  CT imaging of the chest, abdomen and pelvis was performed following the standard protocol during bolus administration of intravenous contrast. RADIATION DOSE REDUCTION: This exam was performed according to the departmental dose-optimization program which includes automated exposure control, adjustment of the mA and/or kV according to patient size and/or use of iterative reconstruction technique. CONTRAST:  60mL OMNIPAQUE IOHEXOL 350 MG/ML SOLN COMPARISON:  05/11/2023 CT abdomen and pelvis.  Chest CT 01/14/2022. FINDINGS: CT CHEST FINDINGS Cardiovascular: Heart is normal size. Aorta is normal caliber. Moderate coronary artery and aortic calcifications. Mediastinum/Nodes: No mediastinal, hilar, or axillary adenopathy. Trachea and esophagus are unremarkable. Thyroid unremarkable. Lungs/Pleura: Lungs are clear. No focal airspace opacities or suspicious nodules. No effusions. Musculoskeletal: Chest wall soft tissues are unremarkable. No acute bony abnormality. CT ABDOMEN PELVIS FINDINGS Hepatobiliary: No focal liver abnormality is seen. Status post cholecystectomy. No biliary dilatation. Pancreas: No focal abnormality or ductal dilatation. Spleen: No focal abnormality.  Normal size. Adrenals/Urinary Tract: No adrenal abnormality. No focal renal abnormality. No stones or hydronephrosis. Bladder decompressed. Stomach/Bowel: Diffuse colonic diverticulosis. No active diverticulitis. Stomach and small bowel decompressed, unremarkable. Vascular/Lymphatic: Aortoiliac atherosclerosis. No evidence of aneurysm or adenopathy. Reproductive: Prior hysterectomy.  No adnexal masses. Other: No free fluid or free air. Musculoskeletal: No acute bony abnormality. IMPRESSION: No acute findings in the chest, abdomen or pelvis. Coronary artery disease, aortic atherosclerosis. Diffuse colonic diverticulosis. Electronically Signed   By: Charlett Nose M.D.   On: 05/17/2023 22:30   DG Chest Portable 1 View  Result Date: 05/17/2023 CLINICAL  DATA:  Weakness, COVID positive. EXAM: PORTABLE CHEST 1 VIEW COMPARISON:  Chest radiograph dated 05/11/2023. FINDINGS: The heart size and mediastinal contours are within normal limits. Vascular calcifications are seen in the aortic arch. Both lungs are clear. Degenerative changes are seen in the spine. IMPRESSION: No active cardiopulmonary disease. Electronically Signed   By: Romona Curls M.D.   On: 05/17/2023 19:38    Microbiology: Recent Results (from the past 240 hour(s))  Urine Culture (for pregnant, neutropenic or urologic patients or patients with an indwelling urinary catheter)     Status: None   Collection Time: 05/18/23  4:58 AM   Specimen: Urine, Clean Catch  Result Value Ref Range Status   Specimen Description URINE, CLEAN CATCH  Final   Special Requests NONE  Final   Culture   Final    NO GROWTH Performed at [facility], [address], [place], [place] [address]    Report Status 05/19/2023 FINAL  Final     Labs:  Basic Metabolic Panel: Recent Labs  Lab 05/17/23 2016 05/18/23 0549 05/19/23 0559 05/20/23 0607  NA 138 142 141 142  K 3.0* 3.0* 3.5 3.8  CL 102 105 110 112*  CO2 20* 24 19* 21*  GLUCOSE 158* 101* 127* 87  BUN 18 14 6* <5*  CREATININE 1.72* 1.40* 1.22* 1.14*  CALCIUM 9.1 8.8* 8.4* 8.6*  MG  --  1.5* 2.1  --   PHOS  --   --  2.2*  --    Liver Function Tests: Recent Labs  Lab 05/17/23 2016 05/18/23 0549 05/19/23 0559  AST 50* 33 25  ALT 40 32 27  ALKPHOS 99 81 81  BILITOT 1.0 0.8 0.5  PROT 6.3* 5.4* 5.8*  ALBUMIN 4.0 3.3* 3.4*   Recent Labs  Lab 05/17/23 2016 05/18/23 0549  LIPASE 75* 26   No results for input(s): "AMMONIA" in the last 168 hours. CBC: Recent Labs  Lab 05/17/23 2016 05/19/23 0559  WBC 11.1* 6.5  NEUTROABS 9.2*  --   HGB 14.5 13.7  HCT 42.8 41.0  MCV 84.1 86.3  PLT 189 151   Cardiac Enzymes: No results for input(s): "CKTOTAL", "CKMB", "CKMBINDEX", "TROPONINI" in the last 168 hours. BNP: BNP (last 3  results) No results for input(s): "BNP" in the last 8760 hours.  ProBNP (last 3 results) No results for input(s): "PROBNP" in the last 8760 hours.  CBG: Recent Labs  Lab 05/17/23 1952 05/18/23 0429  GLUCAP 152* 109*       Signed:  Lacretia Nicks MD.  [facility] 05/20/2023, 1:42 PM

## 2023-05-22 ENCOUNTER — Telehealth: Payer: Self-pay

## 2023-05-22 ENCOUNTER — Telehealth: Payer: Self-pay | Admitting: Internal Medicine

## 2023-05-22 NOTE — Telephone Encounter (Signed)
Marylene Land from [place] [facility] calling to inform that they have accepted referral for pt- patient is requesting a care delay wants it to start on 9/9. Any questions call [phone] Thank you

## 2023-05-22 NOTE — Telephone Encounter (Signed)
Pt daughter calling to make f/u appt for pt since being released from hospital- not seeing any available within 2 wk. Please advise Thank you

## 2023-05-22 NOTE — Transitions of Care (Post Inpatient/ED Visit) (Signed)
05/22/2023  Name: Elizabeth Mcintyre MRN: [id number] DOB: 1941/07/07  Today's TOC FU Call Status: Today's TOC FU Call Status:: Successful TOC FU Call Completed TOC FU Call Complete Date: 05/22/23 Patient's Name and Date of Birth confirmed.  Transition Care Management Follow-up Telephone Call Date of Discharge: 05/20/23 Discharge Facility: [facility]) Type of Discharge: Inpatient Admission Primary Inpatient Discharge Diagnosis:: Acute Kidney Injury How have you been since you were released from the hospital?: Better (Per patients daughter) Any questions or concerns?: No  Items Reviewed: Did you receive and understand the discharge instructions provided?: Yes Medications obtained,verified, and reconciled?: Yes (Medications Reviewed) Any new allergies since your discharge?: No Dietary orders reviewed?: Yes Type of Diet Ordered:: Low sodium Heart Healthy Do you have support at home?: Yes (Patient living with daughter currently) People in Home: child(ren), adult Name of Support/Comfort Primary Source: [name]  Medications Reviewed Today: Medications Reviewed Today     Reviewed by Jodelle Gross, RN (Case Manager) on 05/22/23 at 1233  Med List Status: <None>   Medication Order Taking? Sig Documenting Provider Last Dose Status Informant  acetaminophen (TYLENOL) 325 MG tablet [id number] Yes Take 325 mg by mouth every 6 (six) hours as needed for moderate pain.  [provider] Taking Active Self  albuterol (VENTOLIN HFA) 108 (90 Base) MCG/ACT inhaler [id number] Yes Inhale 2 puffs into the lungs every 6 (six) hours as needed. [provider] Taking Active Self, Pharmacy Records  Ascorbic Acid (VITAMIN C) 100 MG tablet [id number] Yes Take 100 mg by mouth daily. [provider] Taking Active Self  ASCORBIC ACID PO [id number]  Take 1 tablet by mouth daily. [provider]  Active Self, Pharmacy Records  cetirizine (ZYRTEC) 10 MG tablet [id number] Yes Take 10 mg  by mouth at bedtime. As needed [provider] Taking Active   Cholecalciferol (VITAMIN D3 PO) [id number]  Take 1 tablet by mouth daily. [provider]  Active Self, Pharmacy Records  Cholecalciferol (VITAMIN D3) 25 MCG (1000 UT) CAPS [id number] Yes Take 1,000 Units by mouth daily.  [provider] Taking Active Self  Cyanocobalamin (VITAMIN B12 PO) [id number]  Take 1 tablet by mouth daily. [provider]  Active Self, Pharmacy Records  donepezil (ARICEPT) 10 MG tablet [id number] Yes Take 10 mg by mouth daily. [provider] Taking Active Self, Pharmacy Records  gabapentin (NEURONTIN) 100 MG capsule [id number] Yes Take 1 capsule (100 mg total) by mouth daily. Zigmund Daniel., MD Taking Active   levothyroxine (SYNTHROID) 75 MCG tablet [id number] Yes Take 75 mcg by mouth daily. [provider] Taking Active Self, Pharmacy Records  mirtazapine (REMERON) 7.5 MG tablet [id number] Yes Take 2 tablets (15 mg total) by mouth at bedtime. Follow with your outpatient provider for further refills and adjustments Zigmund Daniel., MD Taking Active   pantoprazole (PROTONIX) 40 MG tablet [id number] Yes Take 40 mg by mouth daily. [provider] Taking Active Self, Pharmacy Records  rOPINIRole (REQUIP) 0.25 MG tablet [id number] Yes Take 0.25 mg by mouth at bedtime. [provider] Taking Active Self, Pharmacy Records  rosuvastatin (CRESTOR) 20 MG tablet [id number] Yes Take 20 mg by mouth daily. [provider] Taking Active Self, Pharmacy Records  sucralfate (CARAFATE) 1 g tablet [id number] Yes Take 1 g by mouth 4 (four) times daily. [provider] Taking Active Self, Pharmacy Records           Med Note [facility], [place] May 17, 2023 11:53  PM) Doesn't remember to take with each meal  vitamin B-12 (CYANOCOBALAMIN) 1000 MCG tablet [id number] Yes Take 1,000 mcg by mouth daily.  [provider] Taking Active Self             Home Care and Equipment/Supplies: Were Home Health Services Ordered?: Yes Name of Home Health Agency:: [facility] Has Agency set up a time to come to your home?: No EMR reviewed for Home Health Orders: Orders present/patient has not received call (refer to CM for follow-up) Any new equipment or medical supplies ordered?: No  Functional Questionnaire: Do you need assistance with bathing/showering or dressing?: Yes Do you need assistance with meal preparation?: Yes Do you need assistance with eating?: No Do you have difficulty maintaining continence: No Do you need assistance with getting out of bed/getting out of a chair/moving?: No Do you have difficulty managing or taking your medications?: Yes  Follow up appointments reviewed: PCP Follow-up appointment confirmed?: No MD Provider Line Number:[phone] Given: No (Patients daughter prefers to call and make appointment) Specialist Hospital Follow-up appointment confirmed?: NA Do you need transportation to your follow-up appointment?: No Do you understand care options if your condition(s) worsen?: Yes-patient verbalized understanding  SDOH Interventions Today    Flowsheet Row Most Recent Value  SDOH Interventions   Food Insecurity Interventions Intervention Not Indicated  Housing Interventions Intervention Not Indicated  Transportation Interventions Intervention Not Indicated     Jodelle Gross RN, BSN, CCM [place]  Population Health RN Care Coordinator/ Transitions of Care Direct Dial: [phone]  Fax: [phone]

## 2023-05-25 DIAGNOSIS — G9331 Postviral fatigue syndrome: Secondary | ICD-10-CM | POA: Diagnosis not present

## 2023-05-25 DIAGNOSIS — N1831 Chronic kidney disease, stage 3a: Secondary | ICD-10-CM | POA: Diagnosis not present

## 2023-05-25 DIAGNOSIS — G309 Alzheimer's disease, unspecified: Secondary | ICD-10-CM | POA: Diagnosis not present

## 2023-05-25 DIAGNOSIS — N179 Acute kidney failure, unspecified: Secondary | ICD-10-CM | POA: Diagnosis not present

## 2023-05-25 DIAGNOSIS — E876 Hypokalemia: Secondary | ICD-10-CM | POA: Diagnosis not present

## 2023-05-25 DIAGNOSIS — F0280 Dementia in other diseases classified elsewhere without behavioral disturbance: Secondary | ICD-10-CM | POA: Diagnosis not present

## 2023-05-25 DIAGNOSIS — I2510 Atherosclerotic heart disease of native coronary artery without angina pectoris: Secondary | ICD-10-CM | POA: Diagnosis not present

## 2023-05-25 DIAGNOSIS — U099 Post covid-19 condition, unspecified: Secondary | ICD-10-CM | POA: Diagnosis not present

## 2023-05-25 DIAGNOSIS — R7303 Prediabetes: Secondary | ICD-10-CM | POA: Diagnosis not present

## 2023-05-26 ENCOUNTER — Telehealth: Payer: Self-pay | Admitting: *Deleted

## 2023-05-26 ENCOUNTER — Telehealth: Payer: Self-pay | Admitting: Internal Medicine

## 2023-05-26 NOTE — Progress Notes (Signed)
  Care Coordination   Note   05/26/2023 Name: Elizabeth Mcintyre MRN: [id number] DOB: 12-07-1940  [name] is a 82 y.o. year old female who sees Anabel Halon, MD for primary care. I reached out to [name] by phone today to offer care coordination services.  Ms. Knepper was given information about Care Coordination services today including:   The Care Coordination services include support from the care team which includes your Nurse Coordinator, Clinical Social Worker, or Pharmacist.  The Care Coordination team is here to help remove barriers to the health concerns and goals most important to you. Care Coordination services are voluntary, and the patient may decline or stop services at any time by request to their care team member.   Care Coordination Consent Status: Patient agreed to services and verbal consent obtained.   Follow up plan:  Telephone appointment with care coordination team member scheduled for:  05/27/23  Encounter Outcome:  Patient Scheduled  [facility] Coordination Care Guide  Direct Dial: [phone]

## 2023-05-26 NOTE — Telephone Encounter (Signed)
Left voicemail for verbal orders

## 2023-05-26 NOTE — Telephone Encounter (Signed)
Elizabeth Mcintyre Physical therapist with [facility] called in on patient behalf   Verbal orders  PT  1 week 4 Endurance and strengthening  Call back info [phone] Secure voicemail can leave messages

## 2023-05-27 ENCOUNTER — Ambulatory Visit: Payer: Self-pay | Admitting: Licensed Clinical Social Worker

## 2023-05-27 NOTE — Telephone Encounter (Signed)
Scheduled 09.18 will this be okay?

## 2023-05-27 NOTE — Patient Outreach (Signed)
Care Coordination   Initial Visit Note   05/27/2023 Name: Elizabeth Mcintyre MRN: [id number] DOB: 01-Mar-1941  Elizabeth Mcintyre is a 82 y.o. year old female who sees Anabel Halon, MD for primary care. I spoke with  Elizabeth Mcintyre / Elizabeth Mcintyre, daughter of client, via  phone today.  What matters to the patients health and wellness today?  Patient needs help with ADLs and daily  care activities     Goals Addressed             This Visit's Progress    Patient needs help with ADLs and daily care activities       Interventions:  Spoke with Elizabeth Mcintyre, daughter, about client needs Elizabeth Mcintyre said client has support from oncologist and neurologist.   Discussed medication procurement of client Discussed pain issues of client. Client has occasional knee pain Discussed client care with PCP Dr. Trena Platt.  She has appointment with Dr. Allena Katz next week Discussed appetite. [name] feels that client is eating well at present. Discussed sleeping of client.  [name] said she thinks client sleeps well Discussed breathing issues of client.  [name] said client is breathing well.  Client has inhaler to use as needed Discussed dementia diagnosis. Elizabeth Mcintyre, client, sees neurologist as scheduled for support.   Discussed fall history of client Discussed client memory recall and challenges in memory recall Discussed mood of client. Elizabeth Mcintyre feels that client mood is stable at present. Client has strong support from daughter, Elizabeth Mcintyre said client has headache occasionally. Client has diarrhea occasionally .  [name] said she has talked with PCP about these client symptoms Discussed Physical therapy support. Client is scheduled to receive physical therapy session 1 time per week for the coming month Provided counseling support to Elizabeth Mcintyre related to addressing the care needs of client Josefina Do for phone call with LCSW today Encouraged client or Elizabeth Mcintyre to call LCSW as needed for SW  support for client at [phone]         SDOH assessments and interventions completed:  Yes  SDOH Interventions Today    Flowsheet Row Most Recent Value  SDOH Interventions   Physical Activity Interventions Other (Comments)  [has a cane and a walker to use as needed]  Stress Interventions Other (Comment)  [client has stress in managing daily needs]        Care Coordination Interventions:  Yes, provided   Interventions Today    Flowsheet Row Most Recent Value  Chronic Disease   Chronic disease during today's visit Other  [spoke with Elizabeth Mcintyre, daughter of client, about client needs]  General Interventions   General Interventions Discussed/Reviewed General Interventions Discussed, Community Resources  Exercise Interventions   Exercise Discussed/Reviewed Physical Activity  [client has a cane and a walker to use as needed]  Physical Activity Discussed/Reviewed Physical Activity Discussed  Education Interventions   Education Provided Provided Education  Provided Verbal Education On [employer]  [discussed program support with RN, LCSW, Pharmacist]  Mental Health Interventions   Mental Health Discussed/Reviewed Coping Strategies  [no mood issues noted. Client has support of Elizabeth Mcintyre, daughter]  Nutrition Interventions   Nutrition Discussed/Reviewed Nutrition Discussed  Pharmacy Interventions   Pharmacy Dicussed/Reviewed Pharmacy Topics Discussed  Safety Interventions   Safety Discussed/Reviewed Fall Risk        Follow up plan: Follow up call scheduled for 07/06/23 at 9:30 AM    Encounter Outcome:  Patient Visit Completed   Casimiro Needle  S.Ming Kunka MSW, LCSW Licensed Clinical Social Worker [facility] Care Management [phone]

## 2023-05-27 NOTE — Patient Instructions (Signed)
Visit Information  Thank you for taking time to visit with me today. Please don't hesitate to contact me if I can be of assistance to you.   Following are the goals we discussed today:   Goals Addressed             This Visit's Progress    Patient needs help with ADLs and daily care activities       Interventions:  Spoke with [name], daughter, about client needs [name] said client has support from oncologist and neurologist.   Discussed medication procurement of client Discussed pain issues of client. Client has occasional knee pain Discussed client care with PCP Dr. Trena Platt.  She has appointment with Dr. Allena Katz next week Discussed appetite. [name] feels that client is eating well at present. Discussed sleeping of client.  [name] said she thinks client sleeps well Discussed breathing issues of client.  [name] said client is breathing well.  Client has inhaler to use as needed Discussed dementia diagnosis. Rucha, client, sees neurologist as scheduled for support.   Discussed fall history of client Discussed client memory recall and challenges in memory recall Discussed mood of client. [name] feels that client mood is stable at present. Client has strong support from daughter, [name] said client has headache occasionally. Client has diarrhea occasionally .  [name] said she has talked with PCP about these client symptoms Discussed Physical therapy support. Client is scheduled to receive physical therapy session 1 time per week for the coming month Provided counseling support to [name] related to addressing the care needs of client Josefina Do for phone call with LCSW today Encouraged client or [name] to call LCSW as needed for SW support for client at [phone]         Our next appointment is by telephone on 07/06/23 at 9:30 AM   Please call the care guide team at [phone] if you need to cancel or reschedule your appointment.   If  you are experiencing a Mental Health or Behavioral Health Crisis or need someone to talk to, please go to [facility] [address], [place] [phone])   The patient / [name], daughter of patient, verbalized understanding of instructions, educational materials, and care plan provided today and DECLINED offer to receive copy of patient instructions, educational materials, and care plan.   The patient / [name], daughter of patient, has been provided with contact information for the care management team and has been advised to call with any health related questions or concerns.   Kelton Pillar.Nadiyah Zeis MSW, LCSW Licensed Visual merchandiser [facility] Care Management [phone]

## 2023-05-29 NOTE — Telephone Encounter (Signed)
Called patient left voicemail to contact our office with any concerns.

## 2023-05-31 ENCOUNTER — Other Ambulatory Visit: Payer: Self-pay | Admitting: Internal Medicine

## 2023-06-02 DIAGNOSIS — N1831 Chronic kidney disease, stage 3a: Secondary | ICD-10-CM | POA: Diagnosis not present

## 2023-06-02 DIAGNOSIS — U099 Post covid-19 condition, unspecified: Secondary | ICD-10-CM | POA: Diagnosis not present

## 2023-06-02 DIAGNOSIS — I2510 Atherosclerotic heart disease of native coronary artery without angina pectoris: Secondary | ICD-10-CM | POA: Diagnosis not present

## 2023-06-02 DIAGNOSIS — N179 Acute kidney failure, unspecified: Secondary | ICD-10-CM | POA: Diagnosis not present

## 2023-06-02 DIAGNOSIS — G309 Alzheimer's disease, unspecified: Secondary | ICD-10-CM | POA: Diagnosis not present

## 2023-06-02 DIAGNOSIS — G9331 Postviral fatigue syndrome: Secondary | ICD-10-CM | POA: Diagnosis not present

## 2023-06-02 DIAGNOSIS — E876 Hypokalemia: Secondary | ICD-10-CM | POA: Diagnosis not present

## 2023-06-02 DIAGNOSIS — R7303 Prediabetes: Secondary | ICD-10-CM | POA: Diagnosis not present

## 2023-06-02 DIAGNOSIS — F0280 Dementia in other diseases classified elsewhere without behavioral disturbance: Secondary | ICD-10-CM | POA: Diagnosis not present

## 2023-06-03 ENCOUNTER — Ambulatory Visit (INDEPENDENT_AMBULATORY_CARE_PROVIDER_SITE_OTHER): Payer: Medicare HMO | Admitting: Internal Medicine

## 2023-06-03 ENCOUNTER — Encounter: Payer: Self-pay | Admitting: Internal Medicine

## 2023-06-03 VITALS — BP 164/84 | HR 73 | Ht 62.0 in | Wt 138.8 lb

## 2023-06-03 DIAGNOSIS — C8580 Other specified types of non-Hodgkin lymphoma, unspecified site: Secondary | ICD-10-CM | POA: Diagnosis not present

## 2023-06-03 DIAGNOSIS — N179 Acute kidney failure, unspecified: Secondary | ICD-10-CM

## 2023-06-03 DIAGNOSIS — K529 Noninfective gastroenteritis and colitis, unspecified: Secondary | ICD-10-CM

## 2023-06-03 DIAGNOSIS — F339 Major depressive disorder, recurrent, unspecified: Secondary | ICD-10-CM

## 2023-06-03 DIAGNOSIS — K2100 Gastro-esophageal reflux disease with esophagitis, without bleeding: Secondary | ICD-10-CM | POA: Diagnosis not present

## 2023-06-03 DIAGNOSIS — R1114 Bilious vomiting: Secondary | ICD-10-CM | POA: Diagnosis not present

## 2023-06-03 DIAGNOSIS — R7303 Prediabetes: Secondary | ICD-10-CM

## 2023-06-03 DIAGNOSIS — E782 Mixed hyperlipidemia: Secondary | ICD-10-CM

## 2023-06-03 DIAGNOSIS — I10 Essential (primary) hypertension: Secondary | ICD-10-CM

## 2023-06-03 MED ORDER — MIRTAZAPINE 15 MG PO TABS
15.0000 mg | ORAL_TABLET | Freq: Every day | ORAL | 5 refills | Status: DC
Start: 2023-06-03 — End: 2023-09-01

## 2023-06-03 MED ORDER — AMLODIPINE BESYLATE 10 MG PO TABS: 10.0000 mg | ORAL_TABLET | Freq: Every day | ORAL | 1 refills | Status: DC

## 2023-06-03 NOTE — Assessment & Plan Note (Signed)
On Crestor

## 2023-06-03 NOTE — Patient Instructions (Addendum)
Please continue taking Pantoprazole once daily. Please take Pepcid as needed for acid reflux in the evening.  Please maintain at least 64 ounces of fluid intake in a day. Please eat at regular intervals, at least 3 meals in a day.  Please start taking Amlodipine 10 mg once daily.  Please continue to take other medications as prescribed.  Please continue to follow low carb diet and ambulate as tolerated.  Please get fasting blood tests done before the next visit.

## 2023-06-03 NOTE — Assessment & Plan Note (Addendum)
BP Readings from Last 1 Encounters:  06/03/23 (!) 164/84   Elevated today as her amlodipine was discontinued recently from hospitalization Overall was well-controlled with Amlodipine 10 mg QD, restart Amlodipine 10 mg QD Counseled for compliance with the medications Advised DASH diet

## 2023-06-03 NOTE — Assessment & Plan Note (Signed)
Likely due to dehydration from vomiting and diarrhea-now resolved Has underlying CKD stage IIIa Recheck CMP Advised to maintain adequate hydration

## 2023-06-03 NOTE — Assessment & Plan Note (Signed)
Needs to take Pantoprazole regularly Needs to take at least 2-3 meals in a day Has sucralfate for persistent acid reflux, can take Pepcid PRN as well

## 2023-06-03 NOTE — Assessment & Plan Note (Signed)
Had COVID infection 3 weeks ago, likely worsened her underlying nausea She also had an episode of syncope likely due to dehydration from vomiting and diarrhea Nausea and vomiting resolved now - no episode of dizziness currently Needs to maintain adequate hydration and eat at regular intervals Continue pantoprazole for GERD

## 2023-06-03 NOTE — Assessment & Plan Note (Signed)
Lab Results  Component Value Date   HGBA1C 6.3 02/19/2023   Advised to follow low-carb diet

## 2023-06-03 NOTE — Assessment & Plan Note (Signed)
Follows up with Oncology Has generalized abdominal pain, checked CT abdomen pelvis with contrast (05/17/23) - unremarkable  Abdominal mass likely reactive lymph node, was not seen in recent CT abdomen Advised to contact if it is persistent or increasing in size

## 2023-06-03 NOTE — Assessment & Plan Note (Signed)
Flowsheet Row Office Visit from 06/03/2023 in [facility]  PHQ-9 Total Score 3      Also had insomnia and decreased appetite - now better, on Remeron 15 mg QD - needs to be compliant

## 2023-06-03 NOTE — Assessment & Plan Note (Signed)
Had watery diarrhea initially, now has loose BM at times Checked GI profile -unremarkable Advised to take Imodium as needed Her daughter reports that she has diarrhea due to her certain food such as dark soda Recent CT abdomen reviewed, negative for any acute etiology

## 2023-06-03 NOTE — Progress Notes (Signed)
Established Patient Office Visit  Subjective:  Patient ID: Elizabeth Mcintyre, female    DOB: March 21, 1941  Age: 82 y.o. MRN: [id number]  CC:  Chief Complaint  Patient presents with   Hospitalization Follow-up    Hospital Follow up     HPI Elizabeth Mcintyre is a 82 y.o. 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 Her daughter is present during the visit today.  She presented to the ER 9/1 with generalized abdominal pain, nausea, vomiting and diarrhea. She suffered an episode of orthostatic syncope on the day of her admission. She did not fall to the ground as her son supported her and she rapidly regained consciousness. She was diagnosed with COVID 8/23 and treated with Paxlovid. In the ER, UA was concerning for a UTI, initially treated for UTI, but urine culture returned negative. CT abdomen/pelvis/chest was without acute findings. She was discharged on 05/20/23.  She was told advised to stop taking amlodipine due to hypotension during hospitalization.  Her dose of mirtazapine has also been increased to 15 mg to improve her appetite.  GERD: She reports constant acid reflux despite taking pantoprazole 40 mg QD.  She also takes sucralfate at times for it.  Denies any melena or hematochezia currently.  Her nausea and vomiting has resolved now.  Her diarrhea has improved now, still has loose BM intermittently.  Of note, her PO intake had been poor, but has improved since being discharged from the hospital.  She reports a small mass in the LLQ of abdominal area, and attributes it to Lovenox injections in the hospital. No local bruising or swelling noted.  Past Medical History:  Diagnosis Date   Acquired hypothyroidism 05/30/2014   Acute cystitis without hematuria 10/09/2022   Acute pain  of right shoulder 04/16/2022   Asthma    Bloating 04/26/2019   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   Cancer 2021   Lymphoma   Carotid artery stenosis 06/13/2016   Chronic SI joint pain    was on tramadol   Diverticulosis 03/2011   Dysphagia 04/18/2020   Essential hypertension, benign 11/09/2007   Current med losartan 100 mg qd, amlodopine 10 mg qd   Fall 07/22/2021   Fecal incontinence 07/07/2019   Gastroesophageal reflux disease with esophagitis 03/06/2015   Gout 06/09/2014   R great toe, ball of foot Stopped HCTZ several mos ago,  On losartan        Hematemesis 03/30/2020   Hepatic steatosis 01/04/2020   Herpes zoster without complication 12/02/2022   History of rheumatoid arthritis    during 30's, was treated.   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   Mild dementia, unclear etiology 11/12/2022   Mild intermittent asthma 11/09/2007   Mixed hyperlipidemia 08/14/2020   Osteopenia 2017   Last  bone density 05/04/2017: -2.4   PONV (postoperative nausea and vomiting)    Port-A-Cath in place 03/28/2020   Restless legs syndrome 01/02/2021   Schatzki's ring    Splenomegaly 01/04/2020   Stage 3a chronic kidney disease 02/14/2016  GFR 36--> monitor 3 mos.    Stress incontinence    Thrombocytopenia 01/04/2020   Type II diabetes mellitus 11/09/2007   diet control   Vitamin D deficiency 05/25/2015    Past Surgical History:  Procedure Laterality Date   ABDOMINAL HYSTERECTOMY     BALLOON DILATION N/A 08/21/2020   Procedure: BALLOON DILATION;  Surgeon: Lanelle Bal, DO;  Location: [facility];  Service: Endoscopy;  Laterality: N/A;   BIOPSY  03/30/2020   Procedure:  BIOPSY;  Surgeon: Bernette Redbird, MD;  Location: [facility];  Service: Endoscopy;;   BIOPSY  05/02/2020   Procedure: BIOPSY;  Surgeon: Marguerita Merles, Reuel Boom, MD;  Location: [facility];  Service: Gastroenterology;;   CARDIAC CATHETERIZATION     X 2, last one in 1998   CHOLECYSTECTOMY N/A 04/13/2017   Procedure: LAPAROSCOPIC CHOLECYSTECTOMY;  Surgeon: Franky Macho, MD;  Location: [facility];  Service: General;  Laterality: N/A;   COLONOSCOPY     COLONOSCOPY  May 2012   Dr. Juanda Chance: mild diverticulosis, otherwise normal.    COLONOSCOPY WITH PROPOFOL N/A 05/02/2020   Dr. Levon Hedger: 8 mm polyp removed from the ascending colon, 2 mm polyp removed from the ascending colon.  Tubular adenomas.  Diverticulosis.  Mucosal ulceration in the sigmoid colon noted, pathology consistent with ischemic colitis.   ESOPHAGOGASTRODUODENOSCOPY N/A 01/28/2015   Dr. Jena Gauss: reflux esophagitis, Schatzki's ring not manipulated due to recent bleeding   ESOPHAGOGASTRODUODENOSCOPY N/A 03/30/2015   Dr. Jena Gauss: Schatzki's ring s/p Elease Hashimoto dilation, previously noted esophageal ulcer completely healed   ESOPHAGOGASTRODUODENOSCOPY N/A 03/30/2020   Buccini: Moderately severe erosive, circumferential, confluent esophagitis with no bleeding found 25 to 40 cm from incisors.  Nonobstructing and mild Schatzki ring, there were also multiple distal esophageal rings noted, minimal hiatal hernia.   ESOPHAGOGASTRODUODENOSCOPY (EGD) WITH PROPOFOL N/A 08/21/2020   Procedure: ESOPHAGOGASTRODUODENOSCOPY (EGD) WITH PROPOFOL;  Surgeon: Lanelle Bal, DO;  Location: [facility];  Service: Endoscopy;  Laterality: N/A;  2:15pm   IR IMAGING GUIDED PORT INSERTION  03/13/2020   IR REMOVAL TUN ACCESS W/ PORT W/O FL MOD SED  10/01/2021   LYMPH NODE BIOPSY Left 03/20/2020   Procedure: LEFT POSTERIOR CERVICAL LYMPH NODE BIOPSY;  Surgeon: Violeta Gelinas, MD;  Location: [facility];  Service: General;  Laterality: Left;   MALONEY DILATION N/A 03/30/2015    Procedure: Alvy Beal;  Surgeon: Corbin Ade, MD;  [facility]: [facility];  Service: Endoscopy;  Laterality: N/A;   POLYPECTOMY  05/02/2020   Procedure: POLYPECTOMY;  Surgeon: Marguerita Merles, Reuel Boom, MD;  Location: [facility];  Service: Gastroenterology;;    Family History  Problem Relation Age of Onset   Heart disease Mother    Osteoarthritis Mother    Sudden death Father    Single kidney Father    Other Father        h/o severe MVA injuries   Hyperlipidemia Sister    Other Daughter        Myalgias   Fibromyalgia Daughter    Allergies Daughter    Heart disease Maternal Grandfather    Sudden death Paternal Grandmother    Diabetes Paternal Grandfather    Heart disease Daughter    Other Daughter        palpitations   Pulmonary fibrosis Maternal Aunt    Cancer Paternal Uncle    Pulmonary fibrosis Maternal Aunt    Colon cancer Neg Hx     Social History   Socioeconomic History   Marital status: Widowed  Spouse name: Not on file   Number of children: 2   Years of education: 12   Highest education level: High school graduate  Occupational History   Occupation: Retired    Comment: Print production planner  Tobacco Use   Smoking status: Never   Smokeless tobacco: Never  Vaping Use   Vaping status: Never Used  Substance and Sexual Activity   Alcohol use: Never   Drug use: Never   Sexual activity: Never  Other Topics Concern   Not on file  Social History Narrative   ** Merged History Encounter **       Ms. Farver is widowed. Her young grandson lives with her, for whom she shares custody with her daughter, the son's aunt.   Right handed    Social Determinants of Health   Financial Resource Strain: Low Risk  (06/04/2021)   Overall Financial Resource Strain (CARDIA)    Difficulty of Paying Living Expenses: Not hard at all  Food Insecurity: No Food Insecurity (05/22/2023)   Hunger Vital Sign    Worried About Running Out of Food in the Last Year: Never true     Ran Out of Food in the Last Year: Never true  Transportation Needs: No Transportation Needs (05/22/2023)   PRAPARE - Administrator, Civil Service (Medical): No    Lack of Transportation (Non-Medical): No  Physical Activity: Insufficiently Active (05/27/2023)   Exercise Vital Sign    Days of Exercise per Week: 1 day    Minutes of Exercise per Session: 10 min  Stress: Stress Concern Present (05/27/2023)   [employer] of Occupational Health - Occupational Stress Questionnaire    Feeling of Stress : To some extent  Social Connections: Socially Isolated (06/04/2021)   Social Connection and Isolation Panel [NHANES]    Frequency of Communication with Friends and Family: More than three times a week    Frequency of Social Gatherings with Friends and Family: More than three times a week    Attends Religious Services: Never    Database administrator or Organizations: No    Attends Banker Meetings: Never    Marital Status: Widowed  Intimate Partner Violence: Not At Risk (05/18/2023)   Humiliation, Afraid, Rape, and Kick questionnaire    Fear of Current or Ex-Partner: No    Emotionally Abused: No    Physically Abused: No    Sexually Abused: No    Outpatient Medications Prior to Visit  Medication Sig Dispense Refill   acetaminophen (TYLENOL) 325 MG tablet Take 325 mg by mouth every 6 (six) hours as needed for moderate pain.      albuterol (VENTOLIN HFA) 108 (90 Base) MCG/ACT inhaler Inhale 2 puffs into the lungs every 6 (six) hours as needed.     Ascorbic Acid (VITAMIN C) 100 MG tablet Take 100 mg by mouth daily.     cetirizine (ZYRTEC) 10 MG tablet Take 10 mg by mouth at bedtime. As needed     Cholecalciferol (VITAMIN D3) 25 MCG (1000 UT) CAPS Take 1,000 Units by mouth daily.      donepezil (ARICEPT) 10 MG tablet Take 10 mg by mouth daily.     gabapentin (NEURONTIN) 100 MG capsule Take 1 capsule (100 mg total) by mouth daily. 30 capsule 0   levothyroxine (SYNTHROID)  75 MCG tablet Take 75 mcg by mouth daily.     pantoprazole (PROTONIX) 40 MG tablet Take 40 mg by mouth daily.     rOPINIRole (REQUIP) 0.25  MG tablet Take 0.25 mg by mouth at bedtime.     rosuvastatin (CRESTOR) 20 MG tablet Take 20 mg by mouth daily.     sucralfate (CARAFATE) 1 g tablet TAKE 1 TABLET(1 GRAM) BY MOUTH FOUR TIMES DAILY- AT BEDTIME WITH MEALS AS NEEDED 120 tablet 0   vitamin B-12 (CYANOCOBALAMIN) 1000 MCG tablet Take 1,000 mcg by mouth daily.      ASCORBIC ACID PO Take 1 tablet by mouth daily.     Cholecalciferol (VITAMIN D3 PO) Take 1 tablet by mouth daily.     Cyanocobalamin (VITAMIN B12 PO) Take 1 tablet by mouth daily.     mirtazapine (REMERON) 7.5 MG tablet Take 2 tablets (15 mg total) by mouth at bedtime. Follow with your outpatient provider for further refills and adjustments 60 tablet 0   No facility-administered medications prior to visit.    Allergies  Allergen Reactions   Codeine Nausea And Vomiting and Rash   Codeine Phosphate Nausea And Vomiting and Rash    ROS Review of Systems  Constitutional:  Positive for fatigue. Negative for chills and fever.  HENT:  Negative for congestion, sinus pressure, sinus pain and sore throat.   Eyes:  Positive for visual disturbance. Negative for pain and discharge.  Respiratory:  Negative for cough and shortness of breath.   Cardiovascular:  Negative for chest pain and palpitations.  Gastrointestinal:  Positive for abdominal pain and diarrhea. Negative for nausea and vomiting.  Endocrine: Negative for polydipsia and polyuria.  Genitourinary:  Negative for dysuria and hematuria.  Musculoskeletal:  Positive for arthralgias (Right shoulder). Negative for neck pain and neck stiffness.  Skin:  Negative for rash.  Neurological:  Positive for headaches. Negative for weakness.  Psychiatric/Behavioral:  Positive for dysphoric mood and sleep disturbance. Negative for agitation and behavioral problems.       Objective:    Physical  Exam Vitals reviewed.  Constitutional:      General: She is not in acute distress.    Appearance: She is not diaphoretic.  HENT:     Head: Normocephalic and atraumatic.     Nose: No congestion.     Mouth/Throat:     Mouth: Mucous membranes are moist.  Eyes:     General: No scleral icterus.    Extraocular Movements: Extraocular movements intact.  Cardiovascular:     Rate and Rhythm: Normal rate and regular rhythm.     Pulses: Normal pulses.     Heart sounds: Normal heart sounds. No murmur heard. Pulmonary:     Breath sounds: Normal breath sounds. No wheezing or rales.  Abdominal:     General: Bowel sounds are normal.     Palpations: Abdomen is soft.     Tenderness: There is abdominal tenderness (Mild, epigastric).     Comments: About 1 cm in diameter nontender mass in LLQ area, no erythema or discharge noted  Musculoskeletal:     Cervical back: Neck supple. No tenderness.     Right lower leg: No edema.     Left lower leg: No edema.     Comments: ROM at right shoulder limited due to pain  Skin:    General: Skin is warm.     Findings: No rash.  Neurological:     General: No focal deficit present.     Mental Status: She is alert and oriented to person, place, and time.     Sensory: No sensory deficit.     Motor: Weakness (B/l LE) present.  Psychiatric:  Mood and Affect: Mood normal.        Behavior: Behavior normal.     BP (!) 164/84 (BP Location: Left Arm)   Pulse 73   Ht 5\' 2"  (1.575 m)   Wt 138 lb 12.8 oz (63 kg)   SpO2 96%   BMI 25.39 kg/m  Wt Readings from Last 3 Encounters:  06/03/23 138 lb 12.8 oz (63 kg)  05/18/23 132 lb 7.9 oz (60.1 kg)  05/08/23 135 lb (61.2 kg)    Lab Results  Component Value Date   TSH 1.189 05/18/2023   Lab Results  Component Value Date   WBC 6.5 05/19/2023   HGB 13.7 05/19/2023   HCT 41.0 05/19/2023   MCV 86.3 05/19/2023   PLT 151 05/19/2023   Lab Results  Component Value Date   NA 142 05/20/2023   K 3.8  05/20/2023   CO2 21 (L) 05/20/2023   GLUCOSE 87 05/20/2023   BUN <5 (L) 05/20/2023   CREATININE 1.14 (H) 05/20/2023   BILITOT 0.5 05/19/2023   ALKPHOS 81 05/19/2023   AST 25 05/19/2023   ALT 27 05/19/2023   PROT 5.8 (L) 05/19/2023   ALBUMIN 3.4 (L) 05/19/2023   CALCIUM 8.6 (L) 05/20/2023   ANIONGAP 9 05/20/2023   EGFR 38 (L) 01/22/2023   GFR 33.87 (L) 12/29/2019   Lab Results  Component Value Date   CHOL 303 (H) 05/05/2022   Lab Results  Component Value Date   HDL 60 05/05/2022   Lab Results  Component Value Date   LDLCALC 182 (H) 05/05/2022   Lab Results  Component Value Date   TRIG 315 (H) 05/05/2022   Lab Results  Component Value Date   CHOLHDL 5.1 (H) 05/05/2022   Lab Results  Component Value Date   HGBA1C 6.3 02/19/2023      Assessment & Plan:   Problem List Items Addressed This Visit       Cardiovascular and Mediastinum   Essential hypertension    BP Readings from Last 1 Encounters:  06/03/23 (!) 164/84   Elevated today as her amlodipine was discontinued recently from hospitalization Overall was well-controlled with Amlodipine 10 mg QD, restart Amlodipine 10 mg QD Counseled for compliance with the medications Advised DASH diet      Relevant Medications   amLODipine (NORVASC) 10 MG tablet     Digestive   Gastroesophageal reflux disease with esophagitis    Needs to take Pantoprazole regularly Needs to take at least 2-3 meals in a day Has sucralfate for persistent acid reflux, can take Pepcid PRN as well      Chronic diarrhea    Had watery diarrhea initially, now has loose BM at times Checked GI profile -unremarkable Advised to take Imodium as needed Her daughter reports that she has diarrhea due to her certain food such as dark soda Recent CT abdomen reviewed, negative for any acute etiology      Nausea and vomiting    Had COVID infection 3 weeks ago, likely worsened her underlying nausea She also had an episode of syncope likely due  to dehydration from vomiting and diarrhea Nausea and vomiting resolved now - no episode of dizziness currently Needs to maintain adequate hydration and eat at regular intervals Continue pantoprazole for GERD        Genitourinary   AKI (acute kidney injury) (HCC) - Primary    Likely due to dehydration from vomiting and diarrhea-now resolved Has underlying CKD stage IIIa Recheck CMP Advised to maintain adequate  hydration        Other   Major depression, recurrent, chronic (HCC)    Flowsheet Row Office Visit from 06/03/2023 in [facility]  PHQ-9 Total Score 3      Also had insomnia and decreased appetite - now better, on Remeron 15 mg QD - needs to be compliant      Relevant Medications   mirtazapine (REMERON) 15 MG tablet   Marginal zone lymphoma    Follows up with Oncology Has generalized abdominal pain, checked CT abdomen pelvis with contrast (05/17/23) - unremarkable  Abdominal mass likely reactive lymph node, was not seen in recent CT abdomen Advised to contact if it is persistent or increasing in size      Mixed hyperlipidemia    On Crestor      Relevant Medications   amLODipine (NORVASC) 10 MG tablet   Other Relevant Orders   Lipid Profile   Prediabetes    Lab Results  Component Value Date   HGBA1C 6.3 02/19/2023   Advised to follow low-carb diet      Relevant Orders   CBC with Differential/Platelet   CMP14+EGFR   Urine Microalbumin w/creat. ratio   Hemoglobin A1c    Meds ordered this encounter  Medications   amLODipine (NORVASC) 10 MG tablet    Sig: Take 1 tablet (10 mg total) by mouth daily.    Dispense:  90 tablet    Refill:  1   mirtazapine (REMERON) 15 MG tablet    Sig: Take 1 tablet (15 mg total) by mouth at bedtime.    Dispense:  30 tablet    Refill:  5    Follow-up: Return in about 2 months (around 08/03/2023) for HTN and weight check.    Anabel Halon, MD

## 2023-06-09 DIAGNOSIS — G309 Alzheimer's disease, unspecified: Secondary | ICD-10-CM | POA: Diagnosis not present

## 2023-06-09 DIAGNOSIS — I2510 Atherosclerotic heart disease of native coronary artery without angina pectoris: Secondary | ICD-10-CM | POA: Diagnosis not present

## 2023-06-09 DIAGNOSIS — R7303 Prediabetes: Secondary | ICD-10-CM | POA: Diagnosis not present

## 2023-06-09 DIAGNOSIS — E876 Hypokalemia: Secondary | ICD-10-CM | POA: Diagnosis not present

## 2023-06-09 DIAGNOSIS — G9331 Postviral fatigue syndrome: Secondary | ICD-10-CM | POA: Diagnosis not present

## 2023-06-09 DIAGNOSIS — U099 Post covid-19 condition, unspecified: Secondary | ICD-10-CM | POA: Diagnosis not present

## 2023-06-09 DIAGNOSIS — F0280 Dementia in other diseases classified elsewhere without behavioral disturbance: Secondary | ICD-10-CM | POA: Diagnosis not present

## 2023-06-09 DIAGNOSIS — N179 Acute kidney failure, unspecified: Secondary | ICD-10-CM | POA: Diagnosis not present

## 2023-06-09 DIAGNOSIS — N1831 Chronic kidney disease, stage 3a: Secondary | ICD-10-CM | POA: Diagnosis not present

## 2023-06-16 DIAGNOSIS — I2510 Atherosclerotic heart disease of native coronary artery without angina pectoris: Secondary | ICD-10-CM | POA: Diagnosis not present

## 2023-06-16 DIAGNOSIS — N1831 Chronic kidney disease, stage 3a: Secondary | ICD-10-CM | POA: Diagnosis not present

## 2023-06-16 DIAGNOSIS — G9331 Postviral fatigue syndrome: Secondary | ICD-10-CM | POA: Diagnosis not present

## 2023-06-16 DIAGNOSIS — E876 Hypokalemia: Secondary | ICD-10-CM | POA: Diagnosis not present

## 2023-06-16 DIAGNOSIS — G309 Alzheimer's disease, unspecified: Secondary | ICD-10-CM | POA: Diagnosis not present

## 2023-06-16 DIAGNOSIS — F0280 Dementia in other diseases classified elsewhere without behavioral disturbance: Secondary | ICD-10-CM | POA: Diagnosis not present

## 2023-06-16 DIAGNOSIS — N179 Acute kidney failure, unspecified: Secondary | ICD-10-CM | POA: Diagnosis not present

## 2023-06-16 DIAGNOSIS — U099 Post covid-19 condition, unspecified: Secondary | ICD-10-CM | POA: Diagnosis not present

## 2023-06-16 DIAGNOSIS — R7303 Prediabetes: Secondary | ICD-10-CM | POA: Diagnosis not present

## 2023-06-17 ENCOUNTER — Ambulatory Visit: Payer: Medicare HMO | Admitting: Family Medicine

## 2023-06-17 ENCOUNTER — Encounter: Payer: Self-pay | Admitting: Family Medicine

## 2023-06-17 ENCOUNTER — Other Ambulatory Visit: Payer: Self-pay | Admitting: Nurse Practitioner

## 2023-06-17 VITALS — BP 138/82 | HR 101 | Ht 62.0 in | Wt 138.0 lb

## 2023-06-17 DIAGNOSIS — R399 Unspecified symptoms and signs involving the genitourinary system: Secondary | ICD-10-CM

## 2023-06-17 DIAGNOSIS — R300 Dysuria: Secondary | ICD-10-CM

## 2023-06-17 DIAGNOSIS — N390 Urinary tract infection, site not specified: Secondary | ICD-10-CM | POA: Diagnosis not present

## 2023-06-17 MED ORDER — AMOXICILLIN-POT CLAVULANATE 875-125 MG PO TABS: 1.0000 | ORAL_TABLET | Freq: Two times a day (BID) | ORAL | 0 refills | Status: AC

## 2023-06-17 NOTE — Assessment & Plan Note (Signed)
Augmentin 875-125 mg twice daily x 5 days Urinalysis, and urine culture ordered- Awaiting results will follow up. May take OTC AZO for urinary pain relief. Explained to increase oral fluid intake. Drink 6-8 glasses of water daily.  Follow up for worsening or persistent symptoms. Patient verbalizes understanding regarding plan of care and all questions answered.

## 2023-06-17 NOTE — Patient Instructions (Signed)

## 2023-06-17 NOTE — Progress Notes (Signed)
Patient Office Visit   Subjective   Patient ID: Elizabeth Mcintyre, female    DOB: 02/10/1941  Age: 82 y.o. MRN: [id number]  CC:  Chief Complaint  Patient presents with   Dysuria    Patient complains of burning with urination starting three days ago.     HPI Elizabeth Mcintyre 82 year old female, presents to the clinic for dysuria started 3 days ago. 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), Cancer (2021), Carotid artery stenosis (06/13/2016), Chronic SI joint pain, Diverticulosis (03/2011), Dysphagia (04/18/2020), Essential hypertension, benign (11/09/2007), Fall (07/22/2021), Fecal incontinence (07/07/2019), Gastroesophageal reflux disease with esophagitis (03/06/2015), Gout (06/09/2014), Hematemesis (03/30/2020), Hepatic steatosis (01/04/2020), Herpes zoster without complication (12/02/2022), History of rheumatoid arthritis, IBS (irritable bowel syndrome), Lacunar infarction, 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), Lower GI bleeding (04/30/2020), Lymphadenopathy (01/23/2020), Major depressive disorder (04/03/2011), 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  Urinary Tract Infection  This is a new problem. The problem occurs every urination. The problem has been gradually worsening. The quality of the pain is described as burning and aching. The  pain is at a severity of 5/10. There has been no fever. She is Not sexually active. There is No history of pyelonephritis. Associated symptoms include flank pain, frequency, hesitancy and urgency. Pertinent negatives include no chills or hematuria. She has tried acetaminophen and increased fluids for the symptoms. The treatment provided moderate relief. There is no history of kidney stones.      Outpatient Encounter Medications as of 06/17/2023  Medication Sig   acetaminophen (TYLENOL) 325 MG tablet Take 325 mg by mouth every 6 (six) hours as needed for moderate pain.    albuterol (VENTOLIN HFA) 108 (90 Base) MCG/ACT inhaler Inhale 2 puffs into the lungs every 6 (six) hours as needed.   amLODipine (NORVASC) 10 MG tablet Take 1 tablet (10 mg total) by mouth daily.   amoxicillin-clavulanate (AUGMENTIN) 875-125 MG tablet Take 1 tablet by mouth 2 (two) times daily for 5 days.   Ascorbic Acid (VITAMIN C) 100 MG tablet Take 100 mg by mouth daily.   cetirizine (ZYRTEC) 10 MG tablet Take 10 mg by mouth at bedtime. As needed   Cholecalciferol (VITAMIN D3) 25 MCG (1000 UT) CAPS Take 1,000 Units by mouth daily.    donepezil (ARICEPT) 10 MG tablet Take 10 mg by mouth daily.   gabapentin (NEURONTIN) 100 MG capsule Take 1 capsule (100 mg total) by mouth daily.   levothyroxine (SYNTHROID) 75 MCG tablet Take 75 mcg by mouth daily.   mirtazapine (REMERON) 15 MG tablet Take 1 tablet (15 mg total) by mouth at bedtime.   pantoprazole (PROTONIX) 40 MG tablet Take 40 mg by mouth daily.   rOPINIRole (REQUIP) 0.25 MG tablet Take 0.25 mg by mouth at bedtime.   rosuvastatin (CRESTOR) 20 MG tablet Take 20 mg by mouth daily.   sucralfate (CARAFATE) 1 g tablet TAKE 1 TABLET(1 GRAM) BY MOUTH FOUR TIMES DAILY- AT BEDTIME WITH MEALS AS NEEDED   vitamin B-12 (CYANOCOBALAMIN) 1000 MCG tablet Take 1,000 mcg by mouth daily.    No  facility-administered encounter medications on file as of 06/17/2023.    Past Surgical History:   Procedure Laterality Date   ABDOMINAL HYSTERECTOMY     BALLOON DILATION N/A 08/21/2020   Procedure: BALLOON DILATION;  Surgeon: Lanelle Bal, DO;  Location: [facility];  Service: Endoscopy;  Laterality: N/A;   BIOPSY  03/30/2020   Procedure: BIOPSY;  Surgeon: Bernette Redbird, MD;  Location: [facility];  Service: Endoscopy;;   BIOPSY  05/02/2020   Procedure: BIOPSY;  Surgeon: Marguerita Merles, Reuel Boom, MD;  Location: [facility];  Service: Gastroenterology;;   CARDIAC CATHETERIZATION     X 2, last one in 1998   CHOLECYSTECTOMY N/A 04/13/2017   Procedure: LAPAROSCOPIC CHOLECYSTECTOMY;  Surgeon: Franky Macho, MD;  Location: [facility];  Service: General;  Laterality: N/A;   COLONOSCOPY     COLONOSCOPY  May 2012   Dr. Juanda Chance: mild diverticulosis, otherwise normal.    COLONOSCOPY WITH PROPOFOL N/A 05/02/2020   Dr. Levon Hedger: 8 mm polyp removed from the ascending colon, 2 mm polyp removed from the ascending colon.  Tubular adenomas.  Diverticulosis.  Mucosal ulceration in the sigmoid colon noted, pathology consistent with ischemic colitis.   ESOPHAGOGASTRODUODENOSCOPY N/A 01/28/2015   Dr. Jena Gauss: reflux esophagitis, Schatzki's ring not manipulated due to recent bleeding   ESOPHAGOGASTRODUODENOSCOPY N/A 03/30/2015   Dr. Jena Gauss: Schatzki's ring s/p Elease Hashimoto dilation, previously noted esophageal ulcer completely healed   ESOPHAGOGASTRODUODENOSCOPY N/A 03/30/2020   Buccini: Moderately severe erosive, circumferential, confluent esophagitis with no bleeding found 25 to 40 cm from incisors.  Nonobstructing and mild Schatzki ring, there were also multiple distal esophageal rings noted, minimal hiatal hernia.   ESOPHAGOGASTRODUODENOSCOPY (EGD) WITH PROPOFOL N/A 08/21/2020   Procedure: ESOPHAGOGASTRODUODENOSCOPY (EGD) WITH PROPOFOL;  Surgeon: Lanelle Bal, DO;  Location: [facility];  Service: Endoscopy;  Laterality: N/A;  2:15pm   IR IMAGING GUIDED PORT INSERTION  03/13/2020   IR REMOVAL TUN ACCESS  W/ PORT W/O FL MOD SED  10/01/2021   LYMPH NODE BIOPSY Left 03/20/2020   Procedure: LEFT POSTERIOR CERVICAL LYMPH NODE BIOPSY;  Surgeon: Violeta Gelinas, MD;  Location: [facility];  Service: General;  Laterality: Left;   MALONEY DILATION N/A 03/30/2015   Procedure: Alvy Beal;  Surgeon: Corbin Ade, MD;  Location: [facility];  Service: Endoscopy;  Laterality: N/A;   POLYPECTOMY  05/02/2020   Procedure: POLYPECTOMY;  Surgeon: Dolores Frame, MD;  Location: [facility];  Service: Gastroenterology;;    Review of Systems  Constitutional:  Negative for chills and fever.  Eyes:  Negative for blurred vision.  Respiratory:  Negative for shortness of breath.   Cardiovascular:  Negative for chest pain.  Gastrointestinal:  Negative for abdominal pain.  Genitourinary:  Positive for dysuria, flank pain, frequency, hesitancy and urgency. Negative for hematuria.      Objective    BP 138/82   Pulse (!) 101   Ht 5\' 2"  (1.575 m)   Wt 138 lb (62.6 kg)   SpO2 95%   BMI 25.24 kg/m   Physical Exam Vitals reviewed.  Constitutional:      General: She is not in acute distress.    Appearance: Normal appearance. She is not ill-appearing, toxic-appearing or diaphoretic.  HENT:     Head: Normocephalic.  Eyes:     General:        Right eye: No discharge.        Left eye: No discharge.     Conjunctiva/sclera: Conjunctivae normal.  Cardiovascular:  Rate and Rhythm: Normal rate.     Pulses: Normal pulses.     Heart sounds: Normal heart sounds.  Pulmonary:     Effort: Pulmonary effort is normal. No respiratory distress.     Breath sounds: Normal breath sounds.  Abdominal:     General: Bowel sounds are normal.     Palpations: Abdomen is soft.     Tenderness: There is no abdominal tenderness. There is guarding. There is no right CVA tenderness or left CVA tenderness.  Musculoskeletal:     Cervical back: Normal range of motion.  Skin:    General: Skin is warm and dry.      Capillary Refill: Capillary refill takes less than 2 seconds.  Neurological:     Mental Status: She is alert.  Psychiatric:        Mood and Affect: Mood normal.        Behavior: Behavior normal.       Assessment & Plan:  Dysuria -     Urinalysis -     Urinalysis -     Urine Culture  UTI symptoms  Urinary tract infection without hematuria, site unspecified Assessment & Plan: Augmentin 875-125 mg twice daily x 5 days Urinalysis, and urine culture ordered- Awaiting results will follow up. May take OTC AZO for urinary pain relief. Explained to increase oral fluid intake. Drink 6-8 glasses of water daily.  Follow up for worsening or persistent symptoms. Patient verbalizes understanding regarding plan of care and all questions answered.    Other orders -     Amoxicillin-Pot Clavulanate; Take 1 tablet by mouth 2 (two) times daily for 5 days.  Dispense: 10 tablet; Refill: 0    Return if symptoms worsen or fail to improve.   Cruzita Lederer Newman Nip, FNP

## 2023-06-18 ENCOUNTER — Other Ambulatory Visit: Payer: Self-pay | Admitting: Internal Medicine

## 2023-06-18 ENCOUNTER — Other Ambulatory Visit: Payer: Self-pay | Admitting: Physician Assistant

## 2023-06-18 LAB — URINALYSIS
Glucose, UA: NEGATIVE
Ketones, UA: NEGATIVE
Leukocytes,UA: NEGATIVE
Nitrite, UA: NEGATIVE
Specific Gravity, UA: 1.025 (ref 1.005–1.030)
Urobilinogen, Ur: 0.2 mg/dL (ref 0.2–1.0)
pH, UA: 6 (ref 5.0–7.5)

## 2023-06-18 NOTE — Telephone Encounter (Signed)
Unable to refill per protocol, Rx expired. Discontinued. Provider not at this practice  Requested Prescriptions  Pending Prescriptions Disp Refills   tiZANidine (ZANAFLEX) 4 MG tablet [Pharmacy Med Name: TIZANIDINE 4MG  TABLETS] 30 tablet 0    Sig: TAKE 1 TABLET(4 MG) BY MOUTH AT BEDTIME AS NEEDED FOR MUSCLE SPASMS. 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

## 2023-06-20 LAB — URINE CULTURE

## 2023-06-30 ENCOUNTER — Other Ambulatory Visit: Payer: Self-pay | Admitting: Internal Medicine

## 2023-07-06 ENCOUNTER — Ambulatory Visit: Payer: Self-pay | Admitting: Licensed Clinical Social Worker

## 2023-07-06 NOTE — Patient Outreach (Signed)
Care Coordination   07/06/2023 Name: Elizabeth Mcintyre MRN: [id number] DOB: 04-25-1941   Care Coordination Outreach Attempts:  An unsuccessful telephone outreach was attempted today to offer the patient information about available care coordination services.  Follow Up Plan:  Additional outreach attempts will be made to offer the patient care coordination information and services.   Encounter Outcome:  No Answer   Care Coordination Interventions:  No, not indicated    Kelton Pillar.Beaulah Romanek MSW, LCSW Licensed Visual merchandiser [facility] [phone]

## 2023-07-09 ENCOUNTER — Ambulatory Visit: Payer: Medicare HMO | Admitting: Internal Medicine

## 2023-07-09 ENCOUNTER — Encounter: Payer: Self-pay | Admitting: Internal Medicine

## 2023-07-30 ENCOUNTER — Other Ambulatory Visit: Payer: Self-pay | Admitting: Internal Medicine

## 2023-08-03 ENCOUNTER — Ambulatory Visit: Payer: Medicare HMO | Admitting: Internal Medicine

## 2023-08-03 ENCOUNTER — Encounter: Payer: Self-pay | Admitting: Internal Medicine

## 2023-08-03 VITALS — BP 158/86 | HR 109 | Ht 62.0 in | Wt 143.8 lb

## 2023-08-03 DIAGNOSIS — B0229 Other postherpetic nervous system involvement: Secondary | ICD-10-CM

## 2023-08-03 DIAGNOSIS — Z23 Encounter for immunization: Secondary | ICD-10-CM | POA: Diagnosis not present

## 2023-08-03 DIAGNOSIS — I10 Essential (primary) hypertension: Secondary | ICD-10-CM | POA: Diagnosis not present

## 2023-08-03 DIAGNOSIS — N1831 Chronic kidney disease, stage 3a: Secondary | ICD-10-CM

## 2023-08-03 DIAGNOSIS — F339 Major depressive disorder, recurrent, unspecified: Secondary | ICD-10-CM | POA: Diagnosis not present

## 2023-08-03 DIAGNOSIS — E782 Mixed hyperlipidemia: Secondary | ICD-10-CM | POA: Diagnosis not present

## 2023-08-03 DIAGNOSIS — R7303 Prediabetes: Secondary | ICD-10-CM | POA: Diagnosis not present

## 2023-08-03 DIAGNOSIS — E039 Hypothyroidism, unspecified: Secondary | ICD-10-CM

## 2023-08-03 NOTE — Progress Notes (Signed)
Established Patient Office Visit  Subjective:  Patient ID: Elizabeth Mcintyre, female    DOB: 08-Dec-1940  Age: 82 y.o. MRN: [id number]  CC:  Chief Complaint  Patient presents with   Hypertension    Two month follow up    Weight Check    Two month follow up     HPI Elizabeth Mcintyre is a 82 y.o. 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  HTN: BP is uncontrolled. Takes amlodipine 10 mg QD regularly. Patient denies headache, dizziness, chest pain, dyspnea or palpitations.  Hypothyroidism: Her TSH was wnl in the last visit.  Of note, she has started taking levothyroxine regularly now.  Her daughter is helping her to take medicines regularly now.   Alzheimer's dementia: She has had episodes of confusion and insomnia.  She also has been feeling down, especially on cold and cloudy days.  She was prescribed Remeron, and reports taking it regularly now.  She is on Aricept for dementia, followed by neurology.  She has gained about 5 lbs since the visit in 09/24.  Her appetite has improved with Remeron as well.  Denies any nausea, vomiting or diarrhea currently.  Past Medical History:  Diagnosis Date   Acquired hypothyroidism 05/30/2014   Acute cystitis without hematuria 10/09/2022   Acute pain of right shoulder 04/16/2022   Asthma    Bloating 04/26/2019   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   Cancer 2021   Lymphoma   Carotid artery stenosis 06/13/2016   Chronic SI joint pain    was on tramadol   Diverticulosis 03/2011   Dysphagia 04/18/2020   Essential hypertension, benign 11/09/2007   Current med losartan 100 mg qd, amlodopine 10 mg qd   Fall 07/22/2021   Fecal incontinence 07/07/2019   Gastroesophageal reflux disease with esophagitis 03/06/2015   Gout 06/09/2014   R great toe,  ball of foot Stopped HCTZ several mos ago,  On losartan        Hematemesis 03/30/2020   Hepatic steatosis 01/04/2020   Herpes zoster without complication 12/02/2022   History of rheumatoid arthritis    during 30's, was treated.   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    Stress incontinence    Thrombocytopenia 01/04/2020   Type II diabetes mellitus 11/09/2007   diet control   Vitamin D deficiency 05/25/2015    Past Surgical History:  Procedure Laterality Date   ABDOMINAL HYSTERECTOMY     BALLOON DILATION N/A 08/21/2020   Procedure: BALLOON DILATION;  Surgeon: Lanelle Bal, DO;  [facility];  Service: Endoscopy;  Laterality:  N/A;   BIOPSY  03/30/2020   Procedure: BIOPSY;  Surgeon: Bernette Redbird, MD;  Location: [facility];  Service: Endoscopy;;   BIOPSY  05/02/2020   Procedure: BIOPSY;  Surgeon: Marguerita Merles, Reuel Boom, MD;  Location: [facility];  Service: Gastroenterology;;   CARDIAC CATHETERIZATION     X 2, last one in 1998   CHOLECYSTECTOMY N/A 04/13/2017   Procedure: LAPAROSCOPIC CHOLECYSTECTOMY;  Surgeon: Franky Macho, MD;  Location: [facility];  Service: General;  Laterality: N/A;   COLONOSCOPY     COLONOSCOPY  May 2012   Dr. Juanda Chance:  mild diverticulosis, otherwise normal.    COLONOSCOPY WITH PROPOFOL N/A 05/02/2020   Dr. Levon Hedger: 8 mm polyp removed from the ascending colon, 2 mm polyp removed from the ascending colon.  Tubular adenomas.  Diverticulosis.  Mucosal ulceration in the sigmoid colon noted, pathology consistent with ischemic colitis.   ESOPHAGOGASTRODUODENOSCOPY N/A 01/28/2015   Dr. Jena Gauss: reflux esophagitis, Schatzki's ring not manipulated due to recent bleeding   ESOPHAGOGASTRODUODENOSCOPY N/A 03/30/2015   Dr. Jena Gauss: Schatzki's ring s/p Elease Hashimoto dilation, previously noted esophageal ulcer completely healed   ESOPHAGOGASTRODUODENOSCOPY N/A 03/30/2020   Buccini: Moderately severe erosive, circumferential, confluent esophagitis with no bleeding found 25 to 40 cm from incisors.  Nonobstructing and mild Schatzki ring, there were also multiple distal esophageal rings noted, minimal hiatal hernia.   ESOPHAGOGASTRODUODENOSCOPY (EGD) WITH PROPOFOL N/A 08/21/2020   Procedure: ESOPHAGOGASTRODUODENOSCOPY (EGD) WITH PROPOFOL;  Surgeon: Lanelle Bal, DO;  [facility];  Service: Endoscopy;  Laterality: N/A;  2:15pm   IR IMAGING GUIDED PORT INSERTION  03/13/2020   IR REMOVAL TUN ACCESS W/ PORT W/O FL MOD SED  10/01/2021   LYMPH NODE BIOPSY Left 03/20/2020   Procedure: LEFT POSTERIOR CERVICAL LYMPH NODE BIOPSY;  Surgeon: Violeta Gelinas, MD;  Location: [facility];  Service: General;  Laterality: Left;   MALONEY DILATION N/A 03/30/2015   Procedure: Alvy Beal;  Surgeon: Corbin Ade, MD;  [facility];  Service: Endoscopy;  Laterality: N/A;   POLYPECTOMY  05/02/2020   Procedure: POLYPECTOMY;  Surgeon: Marguerita Merles, Reuel Boom, MD;  [facility];  Service: Gastroenterology;;    Family History  Problem Relation Age of Onset   Heart disease Mother    Osteoarthritis Mother    Sudden death Father    Single kidney Father    Other Father        h/o severe MVA injuries   Hyperlipidemia Sister     Other Daughter        Myalgias   Fibromyalgia Daughter    Allergies Daughter    Heart disease Maternal Grandfather    Sudden death Paternal Grandmother    Diabetes Paternal Grandfather    Heart disease Daughter    Other Daughter        palpitations   Pulmonary fibrosis Maternal Aunt    Cancer Paternal Uncle    Pulmonary fibrosis Maternal Aunt    Colon cancer Neg Hx     Social History   Socioeconomic History   Marital status: Widowed    Spouse name: Not on file   Number of children: 2   Years of education: 12   Highest education level: High school graduate  Occupational History   Occupation: Retired    Comment: Print production planner  Tobacco Use   Smoking status: Never   Smokeless tobacco: Never  Vaping Use   Vaping status: Never Used  Substance and Sexual Activity   Alcohol use: Never  Drug use: Never   Sexual activity: Never  Other Topics Concern   Not on file  Social History Narrative   ** Merged History Encounter **       Ms. Danese is widowed. Her young grandson lives with her, for whom she shares custody with her daughter, the son's aunt.   Right handed    Social Determinants of Health   Financial Resource Strain: Low Risk  (06/04/2021)   Overall Financial Resource Strain (CARDIA)    Difficulty of Paying Living Expenses: Not hard at all  Food Insecurity: No Food Insecurity (05/22/2023)   Hunger Vital Sign    Worried About Running Out of Food in the Last Year: Never true    Ran Out of Food in the Last Year: Never true  Transportation Needs: No Transportation Needs (05/22/2023)   PRAPARE - Administrator, Civil Service (Medical): No    Lack of Transportation (Non-Medical): No  Physical Activity: Insufficiently Active (05/27/2023)   Exercise Vital Sign    Days of Exercise per Week: 1 day    Minutes of Exercise per Session: 10 min  Stress: Stress Concern Present (05/27/2023)   [employer] of Occupational Health - Occupational Stress Questionnaire     Feeling of Stress : To some extent  Social Connections: Socially Isolated (06/04/2021)   Social Connection and Isolation Panel [NHANES]    Frequency of Communication with Friends and Family: More than three times a week    Frequency of Social Gatherings with Friends and Family: More than three times a week    Attends Religious Services: Never    Database administrator or Organizations: No    Attends Banker Meetings: Never    Marital Status: Widowed  Intimate Partner Violence: Not At Risk (05/18/2023)   Humiliation, Afraid, Rape, and Kick questionnaire    Fear of Current or Ex-Partner: No    Emotionally Abused: No    Physically Abused: No    Sexually Abused: No    Outpatient Medications Prior to Visit  Medication Sig Dispense Refill   acetaminophen (TYLENOL) 325 MG tablet Take 325 mg by mouth every 6 (six) hours as needed for moderate pain.      albuterol (VENTOLIN HFA) 108 (90 Base) MCG/ACT inhaler Inhale 2 puffs into the lungs every 6 (six) hours as needed.     amLODipine (NORVASC) 10 MG tablet Take 1 tablet (10 mg total) by mouth daily. 90 tablet 1   Ascorbic Acid (VITAMIN C) 100 MG tablet Take 100 mg by mouth daily.     cetirizine (ZYRTEC) 10 MG tablet Take 10 mg by mouth at bedtime. As needed     Cholecalciferol (VITAMIN D3) 25 MCG (1000 UT) CAPS Take 1,000 Units by mouth daily.      donepezil (ARICEPT) 10 MG tablet TAKE 1 TABLET BY MOUTH DAILY 30 tablet 3   gabapentin (NEURONTIN) 100 MG capsule TAKE 1 CAPSULE(100 MG) BY MOUTH DAILY 30 capsule 0   levothyroxine (SYNTHROID) 75 MCG tablet Take 75 mcg by mouth daily.     mirtazapine (REMERON) 15 MG tablet Take 1 tablet (15 mg total) by mouth at bedtime. 30 tablet 5   pantoprazole (PROTONIX) 40 MG tablet Take 40 mg by mouth daily.     rOPINIRole (REQUIP) 0.25 MG tablet Take 0.25 mg by mouth at bedtime.     rosuvastatin (CRESTOR) 20 MG tablet Take 20 mg by mouth daily.     sucralfate (CARAFATE) 1 g tablet TAKE 1  TABLET(1  GRAM) BY MOUTH FOUR TIMES DAILY- AT BEDTIME WITH MEALS AS NEEDED 120 tablet 0   vitamin B-12 (CYANOCOBALAMIN) 1000 MCG tablet Take 1,000 mcg by mouth daily.      No facility-administered medications prior to visit.    Allergies  Allergen Reactions   Codeine Nausea And Vomiting and Rash   Codeine Phosphate Nausea And Vomiting and Rash    ROS Review of Systems  Constitutional:  Positive for fatigue. Negative for chills and fever.  HENT:  Negative for congestion, sinus pressure, sinus pain and sore throat.   Eyes:  Positive for visual disturbance. Negative for pain and discharge.  Respiratory:  Negative for cough and shortness of breath.   Cardiovascular:  Negative for chest pain and palpitations.  Gastrointestinal:  Negative for nausea and vomiting.  Endocrine: Negative for polydipsia and polyuria.  Genitourinary:  Negative for dysuria and hematuria.  Musculoskeletal:  Positive for arthralgias (Right shoulder). Negative for neck pain and neck stiffness.  Skin:  Negative for rash.  Neurological:  Negative for weakness.  Psychiatric/Behavioral:  Positive for dysphoric mood and sleep disturbance. Negative for agitation and behavioral problems.       Objective:    Physical Exam Vitals reviewed.  Constitutional:      General: She is not in acute distress.    Appearance: She is not diaphoretic.  HENT:     Head: Normocephalic and atraumatic.     Nose: No congestion.     Mouth/Throat:     Mouth: Mucous membranes are moist.  Eyes:     General: No scleral icterus.    Extraocular Movements: Extraocular movements intact.  Cardiovascular:     Rate and Rhythm: Normal rate and regular rhythm.     Heart sounds: Normal heart sounds. No murmur heard. Pulmonary:     Breath sounds: Normal breath sounds. No wheezing or rales.  Abdominal:     General: Bowel sounds are normal.     Palpations: Abdomen is soft.  Musculoskeletal:     Cervical back: Neck supple. No tenderness.     Right  lower leg: No edema.     Left lower leg: No edema.     Comments: ROM at right shoulder limited due to pain  Skin:    General: Skin is warm.     Findings: No rash.  Neurological:     General: No focal deficit present.     Mental Status: She is alert and oriented to person, place, and time.     Sensory: No sensory deficit.     Motor: Weakness (B/l LE) present.  Psychiatric:        Mood and Affect: Mood normal.        Behavior: Behavior normal.     BP (!) 158/86 (BP Location: Left Arm)   Pulse (!) 109   Ht 5\' 2"  (1.575 m)   Wt 143 lb 12.8 oz (65.2 kg)   SpO2 98%   BMI 26.30 kg/m  Wt Readings from Last 3 Encounters:  08/03/23 143 lb 12.8 oz (65.2 kg)  06/17/23 138 lb (62.6 kg)  06/03/23 138 lb 12.8 oz (63 kg)    Lab Results  Component Value Date   TSH 1.189 05/18/2023   Lab Results  Component Value Date   WBC 13.6 (H) 08/03/2023   HGB 13.9 08/03/2023   HCT 43.0 08/03/2023   MCV 90 08/03/2023   PLT 228 08/03/2023   Lab Results  Component Value Date   NA 140 08/03/2023  K 4.2 08/03/2023   CO2 20 08/03/2023   GLUCOSE 143 (H) 08/03/2023   BUN 13 08/03/2023   CREATININE 1.19 (H) 08/03/2023   BILITOT 0.3 08/03/2023   ALKPHOS 94 08/03/2023   AST 29 08/03/2023   ALT 30 08/03/2023   PROT 6.9 08/03/2023   ALBUMIN 4.7 08/03/2023   CALCIUM 9.7 08/03/2023   ANIONGAP 9 05/20/2023   EGFR 46 (L) 08/03/2023   GFR 33.87 (L) 12/29/2019   Lab Results  Component Value Date   CHOL 277 (H) 08/03/2023   Lab Results  Component Value Date   HDL 59 08/03/2023   Lab Results  Component Value Date   LDLCALC 99 08/03/2023   Lab Results  Component Value Date   TRIG 702 (HH) 08/03/2023   Lab Results  Component Value Date   CHOLHDL 4.7 (H) 08/03/2023   Lab Results  Component Value Date   HGBA1C 6.2 (H) 08/03/2023      Assessment & Plan:   Problem List Items Addressed This Visit       Cardiovascular and Mediastinum   Essential hypertension - Primary    BP  Readings from Last 1 Encounters:  08/03/23 (!) 158/86   Elevated today  Uncontrolled with Amlodipine 10 mg once daily Added Coreg 3.125 mg BID, considering tachycardia Counseled for compliance with the medications Advised DASH diet      Relevant Medications   carvedilol (COREG) 3.125 MG tablet     Endocrine   Acquired hypothyroidism    Lab Results  Component Value Date   TSH 1.189 05/18/2023   On Levothyroxine 75 mcg QD, but needs to be compliant      Relevant Medications   carvedilol (COREG) 3.125 MG tablet     Nervous and Auditory   Postherpetic neuralgia    Had persistent burning pain in right thigh where she had shingles, now better with Gabapentin Continue gabapentin 100 mg qHS, may also help with insomnia        Genitourinary   Stage 3a chronic kidney disease    Checked CMP, GFR stays around 45 - needs to improve hydration Avoid nephrotoxic agents        Other   Major depression, recurrent, chronic (HCC)    Flowsheet Row Office Visit from 08/03/2023 in [facility]  PHQ-9 Total Score 4      Also had insomnia and decreased appetite - now better, on Remeron 15 mg QD - needs to be compliant      Other Visit Diagnoses     Encounter for immunization       Relevant Orders   Flu Vaccine Trivalent High Dose (Fluad) (Completed)       Meds ordered this encounter  Medications   carvedilol (COREG) 3.125 MG tablet    Sig: Take 1 tablet (3.125 mg total) by mouth 2 (two) times daily with a meal.    Dispense:  60 tablet    Refill:  3    Follow-up: Return in about 2 months (around 10/03/2023) for HTN and weight check.    Anabel Halon, MD

## 2023-08-03 NOTE — Patient Instructions (Signed)
Please start taking Carvedilol 3.125 mg twice daily.  Please continue to take medications as prescribed.  Please continue to eat at regular intervals diet and ambulate as tolerated.

## 2023-08-04 MED ORDER — CARVEDILOL 3.125 MG PO TABS: 3.1250 mg | ORAL_TABLET | Freq: Two times a day (BID) | ORAL | 3 refills | Status: DC

## 2023-08-05 LAB — CBC WITH DIFFERENTIAL/PLATELET
Basophils Absolute: 0.1 10*3/uL (ref 0.0–0.2)
Basos: 1 %
EOS (ABSOLUTE): 0.7 10*3/uL — ABNORMAL HIGH (ref 0.0–0.4)
Eos: 5 %
Hematocrit: 43 % (ref 34.0–46.6)
Hemoglobin: 13.9 g/dL (ref 11.1–15.9)
Immature Grans (Abs): 0.1 10*3/uL (ref 0.0–0.1)
Immature Granulocytes: 1 %
Lymphocytes Absolute: 5.3 10*3/uL — ABNORMAL HIGH (ref 0.7–3.1)
Lymphs: 39 %
MCH: 29 pg (ref 26.6–33.0)
MCHC: 32.3 g/dL (ref 31.5–35.7)
MCV: 90 fL (ref 79–97)
Monocytes Absolute: 1 10*3/uL — ABNORMAL HIGH (ref 0.1–0.9)
Monocytes: 8 %
Neutrophils Absolute: 6.4 10*3/uL (ref 1.4–7.0)
Neutrophils: 46 %
Platelets: 228 10*3/uL (ref 150–450)
RBC: 4.8 x10E6/uL (ref 3.77–5.28)
RDW: 14 % (ref 11.7–15.4)
WBC: 13.6 10*3/uL — ABNORMAL HIGH (ref 3.4–10.8)

## 2023-08-05 LAB — CMP14+EGFR
ALT: 30 [IU]/L (ref 0–32)
AST: 29 [IU]/L (ref 0–40)
Albumin: 4.7 g/dL (ref 3.7–4.7)
Alkaline Phosphatase: 94 IU/L (ref 44–121)
BUN/Creatinine Ratio: 11 — ABNORMAL LOW (ref 12–28)
BUN: 13 mg/dL (ref 8–27)
Bilirubin Total: 0.3 mg/dL (ref 0.0–1.2)
CO2: 20 mmol/L (ref 20–29)
Calcium: 9.7 mg/dL (ref 8.7–10.3)
Chloride: 100 mmol/L (ref 96–106)
Creatinine, Ser: 1.19 mg/dL — ABNORMAL HIGH (ref 0.57–1.00)
Globulin, Total: 2.2 g/dL (ref 1.5–4.5)
Glucose: 143 mg/dL — ABNORMAL HIGH (ref 70–99)
Potassium: 4.2 mmol/L (ref 3.5–5.2)
Sodium: 140 mmol/L (ref 134–144)
Total Protein: 6.9 g/dL (ref 6.0–8.5)
eGFR: 46 mL/min/{1.73_m2} — ABNORMAL LOW (ref >59)

## 2023-08-05 LAB — LIPID PANEL
Chol/HDL Ratio: 4.7 ratio — ABNORMAL HIGH (ref 0.0–4.4)
Cholesterol, Total: 277 mg/dL — ABNORMAL HIGH (ref 100–199)
HDL: 59 mg/dL (ref >39)
LDL Chol Calc (NIH): 99 mg/dL (ref 0–99)
Triglycerides: 702 mg/dL (ref 0–149)
VLDL Cholesterol Cal: 119 mg/dL — ABNORMAL HIGH (ref 5–40)

## 2023-08-05 LAB — HEMOGLOBIN A1C
Est. average glucose Bld gHb Est-mCnc: 131 mg/dL
Hgb A1c MFr Bld: 6.2 % — ABNORMAL HIGH (ref 4.8–5.6)

## 2023-08-05 LAB — MICROALBUMIN / CREATININE URINE RATIO
Creatinine, Urine: 59.4 mg/dL
Microalb/Creat Ratio: 17 mg/g{creat} (ref 0–29)
Microalbumin, Urine: 10.2 ug/mL

## 2023-08-07 NOTE — Assessment & Plan Note (Signed)
Checked CMP, GFR stays around 45 - needs to improve hydration Avoid nephrotoxic agents

## 2023-08-07 NOTE — Assessment & Plan Note (Signed)
Flowsheet Row Office Visit from 08/03/2023 in [facility]  PHQ-9 Total Score 4      Also had insomnia and decreased appetite - now better, on Remeron 15 mg QD - needs to be compliant

## 2023-08-07 NOTE — Assessment & Plan Note (Signed)
Lab Results  Component Value Date   TSH 1.189 05/18/2023   On Levothyroxine 75 mcg QD, but needs to be compliant

## 2023-08-07 NOTE — Assessment & Plan Note (Addendum)
Had persistent burning pain in right thigh where she had shingles, now better with Gabapentin Continue gabapentin 100 mg qHS, may also help with insomnia

## 2023-08-07 NOTE — Assessment & Plan Note (Signed)
BP Readings from Last 1 Encounters:  08/03/23 (!) 158/86   Elevated today  Uncontrolled with Amlodipine 10 mg once daily Added Coreg 3.125 mg BID, considering tachycardia Counseled for compliance with the medications Advised DASH diet

## 2023-09-01 ENCOUNTER — Telehealth: Payer: Self-pay | Admitting: Internal Medicine

## 2023-09-01 ENCOUNTER — Other Ambulatory Visit: Payer: Self-pay

## 2023-09-01 DIAGNOSIS — F339 Major depressive disorder, recurrent, unspecified: Secondary | ICD-10-CM

## 2023-09-01 DIAGNOSIS — I10 Essential (primary) hypertension: Secondary | ICD-10-CM

## 2023-09-01 MED ORDER — MIRTAZAPINE 15 MG PO TABS
15.0000 mg | ORAL_TABLET | Freq: Every day | ORAL | 5 refills | Status: DC
Start: 2023-09-01 — End: 2023-09-28

## 2023-09-01 MED ORDER — CARVEDILOL 3.125 MG PO TABS
3.1250 mg | ORAL_TABLET | Freq: Two times a day (BID) | ORAL | 3 refills | Status: DC
Start: 2023-09-01 — End: 2024-05-10

## 2023-09-01 MED ORDER — AMLODIPINE BESYLATE 10 MG PO TABS
10.0000 mg | ORAL_TABLET | Freq: Every day | ORAL | 1 refills | Status: DC
Start: 2023-09-01 — End: 2024-05-10

## 2023-09-01 MED ORDER — GABAPENTIN 100 MG PO CAPS: 100.0000 mg | ORAL_CAPSULE | Freq: Every day | ORAL | 0 refills | Status: DC

## 2023-09-01 MED ORDER — SUCRALFATE 1 G PO TABS: 1.0000 g | ORAL_TABLET | Freq: Four times a day (QID) | ORAL | 0 refills | Status: DC

## 2023-09-01 NOTE — Telephone Encounter (Signed)
Refills sent to pharmacy. 

## 2023-09-01 NOTE — Telephone Encounter (Signed)
Prescription Request  09/01/2023  LOV: 08/03/2023  What is the name of the medication or equipment? gabapentin (NEURONTIN) 100 MG capsule   mirtazapine (REMERON) 15 MG tablet [[id number]]   sucralfate (CARAFATE) 1 g tablet [[id number]]   carvedilol (COREG) 3.125  amLODipine (NORVASC) 10 MG tablet [[id number]]    Have you contacted your pharmacy to request a refill? Yes   Which pharmacy would you like this sent to?  Pharmacy  [facility] [phone] [place], [place] - [address] [place] [address] Phone: [phone]  Fax: [phone] DEA #: [id number]    Patient notified that their request is being sent to the clinical staff for review and that they should receive a response within 2 business days.   Please advise at  walked into office

## 2023-09-14 ENCOUNTER — Ambulatory Visit: Payer: Medicare HMO | Admitting: Internal Medicine

## 2023-09-24 ENCOUNTER — Other Ambulatory Visit: Payer: Self-pay | Admitting: Internal Medicine

## 2023-09-24 DIAGNOSIS — F339 Major depressive disorder, recurrent, unspecified: Secondary | ICD-10-CM

## 2023-10-07 ENCOUNTER — Ambulatory Visit: Payer: Medicare HMO | Admitting: Internal Medicine

## 2023-10-22 ENCOUNTER — Ambulatory Visit: Payer: Self-pay | Admitting: Physician Assistant

## 2023-10-23 ENCOUNTER — Ambulatory Visit: Payer: Self-pay | Admitting: Physician Assistant

## 2024-05-01 ENCOUNTER — Emergency Department (HOSPITAL_COMMUNITY)

## 2024-05-01 ENCOUNTER — Encounter (HOSPITAL_COMMUNITY): Payer: Self-pay | Admitting: Emergency Medicine

## 2024-05-01 ENCOUNTER — Observation Stay (HOSPITAL_COMMUNITY)
Admission: EM | Admit: 2024-05-01 | Discharge: 2024-05-02 | Disposition: A | Discharge status: Home / Self Care | Attending: Family Medicine | Admitting: Family Medicine

## 2024-05-01 ENCOUNTER — Other Ambulatory Visit: Payer: Self-pay

## 2024-05-01 DIAGNOSIS — E785 Hyperlipidemia, unspecified: Secondary | ICD-10-CM | POA: Diagnosis not present

## 2024-05-01 DIAGNOSIS — Z8572 Personal history of non-Hodgkin lymphomas: Secondary | ICD-10-CM | POA: Diagnosis not present

## 2024-05-01 DIAGNOSIS — Z79899 Other long term (current) drug therapy: Secondary | ICD-10-CM | POA: Insufficient documentation

## 2024-05-01 DIAGNOSIS — R0789 Other chest pain: Secondary | ICD-10-CM | POA: Diagnosis not present

## 2024-05-01 DIAGNOSIS — G2581 Restless legs syndrome: Secondary | ICD-10-CM | POA: Diagnosis present

## 2024-05-01 DIAGNOSIS — R079 Chest pain, unspecified: Principal | ICD-10-CM | POA: Diagnosis present

## 2024-05-01 DIAGNOSIS — G629 Polyneuropathy, unspecified: Secondary | ICD-10-CM

## 2024-05-01 DIAGNOSIS — G609 Hereditary and idiopathic neuropathy, unspecified: Secondary | ICD-10-CM | POA: Insufficient documentation

## 2024-05-01 DIAGNOSIS — I10 Essential (primary) hypertension: Secondary | ICD-10-CM | POA: Diagnosis not present

## 2024-05-01 DIAGNOSIS — E039 Hypothyroidism, unspecified: Secondary | ICD-10-CM | POA: Diagnosis not present

## 2024-05-01 DIAGNOSIS — E876 Hypokalemia: Secondary | ICD-10-CM | POA: Diagnosis not present

## 2024-05-01 DIAGNOSIS — K219 Gastro-esophageal reflux disease without esophagitis: Secondary | ICD-10-CM | POA: Diagnosis not present

## 2024-05-01 DIAGNOSIS — J45909 Unspecified asthma, uncomplicated: Secondary | ICD-10-CM | POA: Insufficient documentation

## 2024-05-01 DIAGNOSIS — N179 Acute kidney failure, unspecified: Secondary | ICD-10-CM | POA: Diagnosis not present

## 2024-05-01 DIAGNOSIS — N189 Chronic kidney disease, unspecified: Secondary | ICD-10-CM

## 2024-05-01 DIAGNOSIS — I129 Hypertensive chronic kidney disease with stage 1 through stage 4 chronic kidney disease, or unspecified chronic kidney disease: Secondary | ICD-10-CM | POA: Diagnosis not present

## 2024-05-01 DIAGNOSIS — E871 Hypo-osmolality and hyponatremia: Secondary | ICD-10-CM

## 2024-05-01 DIAGNOSIS — E1159 Type 2 diabetes mellitus with other circulatory complications: Secondary | ICD-10-CM | POA: Insufficient documentation

## 2024-05-01 LAB — CBC
HCT: 40.7 % (ref 36.0–46.0)
Hemoglobin: 14.5 g/dL (ref 12.0–15.0)
MCH: 30.1 pg (ref 26.0–34.0)
MCHC: 35.6 g/dL (ref 30.0–36.0)
MCV: 84.6 fL (ref 80.0–100.0)
Platelets: 209 K/uL (ref 150–400)
RBC: 4.81 MIL/uL (ref 3.87–5.11)
RDW: 13 % (ref 11.5–15.5)
WBC: 7.6 K/uL (ref 4.0–10.5)
nRBC: 0 % (ref 0.0–0.2)

## 2024-05-01 LAB — HEPATIC FUNCTION PANEL
ALT: 50 U/L — ABNORMAL HIGH (ref 0–44)
AST: 65 U/L — ABNORMAL HIGH (ref 15–41)
Albumin: 4 g/dL (ref 3.5–5.0)
Alkaline Phosphatase: 65 U/L (ref 38–126)
Bilirubin, Direct: 0.5 mg/dL — ABNORMAL HIGH (ref 0.0–0.2)
Indirect Bilirubin: 1.3 mg/dL — ABNORMAL HIGH (ref 0.3–0.9)
Total Bilirubin: 1.8 mg/dL — ABNORMAL HIGH (ref 0.0–1.2)
Total Protein: 7 g/dL (ref 6.5–8.1)

## 2024-05-01 LAB — BASIC METABOLIC PANEL WITH GFR
Anion gap: 14 (ref 5–15)
BUN: 10 mg/dL (ref 8–23)
CO2: 19 mmol/L — ABNORMAL LOW (ref 22–32)
Calcium: 8.9 mg/dL (ref 8.9–10.3)
Chloride: 96 mmol/L — ABNORMAL LOW (ref 98–111)
Creatinine, Ser: 1.87 mg/dL — ABNORMAL HIGH (ref 0.44–1.00)
GFR, Estimated: 27 mL/min — ABNORMAL LOW (ref >60)
Glucose, Bld: 182 mg/dL — ABNORMAL HIGH (ref 70–99)
Potassium: 3.3 mmol/L — ABNORMAL LOW (ref 3.5–5.1)
Sodium: 129 mmol/L — ABNORMAL LOW (ref 135–145)

## 2024-05-01 LAB — TROPONIN I (HIGH SENSITIVITY)
Troponin I (High Sensitivity): 6 ng/L (ref <18)
Troponin I (High Sensitivity): 7 ng/L (ref <18)

## 2024-05-01 LAB — D-DIMER, QUANTITATIVE: D-Dimer, Quant: 0.49 ug{FEU}/mL (ref 0.00–0.50)

## 2024-05-01 LAB — MAGNESIUM: Magnesium: 1.6 mg/dL — ABNORMAL LOW (ref 1.7–2.4)

## 2024-05-01 MED ORDER — VITAMIN D3 25 MCG (1000 UT) PO CAPS: 1000.0000 [IU] | ORAL_CAPSULE | Freq: Every day | ORAL | Status: DC

## 2024-05-01 MED ORDER — MIRTAZAPINE 15 MG PO TABS: 15.0000 mg | ORAL_TABLET | Freq: Every day | ORAL | Status: DC

## 2024-05-01 MED ORDER — MORPHINE SULFATE (PF) 2 MG/ML IV SOLN
2.0000 mg | Freq: Once | INTRAVENOUS | Status: AC
  Administered 2024-05-01: 2 mg via INTRAVENOUS
  Filled 2024-05-01: qty 1

## 2024-05-01 MED ORDER — LEVOTHYROXINE SODIUM 50 MCG PO TABS: 75.0000 ug | ORAL_TABLET | Freq: Every day | ORAL | Status: DC

## 2024-05-01 MED ORDER — GABAPENTIN 100 MG PO CAPS: 100.0000 mg | ORAL_CAPSULE | Freq: Every day | ORAL | Status: DC

## 2024-05-01 MED ORDER — PANTOPRAZOLE SODIUM 40 MG IV SOLR
40.0000 mg | Freq: Once | INTRAVENOUS | Status: AC
  Administered 2024-05-01: 40 mg via INTRAVENOUS
  Filled 2024-05-01: qty 10

## 2024-05-01 MED ORDER — VITAMIN B-12 1000 MCG PO TABS: 1000.0000 ug | ORAL_TABLET | Freq: Every day | ORAL | Status: DC

## 2024-05-01 MED ORDER — ALBUTEROL SULFATE (2.5 MG/3ML) 0.083% IN NEBU: 3.0000 mL | INHALATION_SOLUTION | Freq: Four times a day (QID) | RESPIRATORY_TRACT | Status: DC | PRN

## 2024-05-01 MED ORDER — ROPINIROLE HCL 0.25 MG PO TABS: 0.2500 mg | ORAL_TABLET | Freq: Every day | ORAL | Status: DC

## 2024-05-01 MED ORDER — POTASSIUM CHLORIDE 10 MEQ/100ML IV SOLN
10.0000 meq | Freq: Once | INTRAVENOUS | Status: AC
  Administered 2024-05-01: 10 meq via INTRAVENOUS
  Filled 2024-05-01: qty 100

## 2024-05-01 MED ORDER — ACETAMINOPHEN 325 MG PO TABS: 650.0000 mg | ORAL_TABLET | ORAL | Status: DC | PRN

## 2024-05-01 MED ORDER — CARVEDILOL 3.125 MG PO TABS: 3.1250 mg | ORAL_TABLET | Freq: Two times a day (BID) | ORAL | Status: DC

## 2024-05-01 MED ORDER — SODIUM CHLORIDE 0.9 % IV SOLN: INTRAVENOUS | Status: DC

## 2024-05-01 MED ORDER — ENOXAPARIN SODIUM 30 MG/0.3ML IJ SOSY
30.0000 mg | PREFILLED_SYRINGE | INTRAMUSCULAR | Status: DC
  Administered 2024-05-02: 30 mg via SUBCUTANEOUS
  Filled 2024-05-01: qty 0.3

## 2024-05-01 MED ORDER — POTASSIUM CHLORIDE 20 MEQ PO PACK
40.0000 meq | PACK | Freq: Once | ORAL | Status: AC
  Administered 2024-05-02: 40 meq via ORAL
  Filled 2024-05-01: qty 2

## 2024-05-01 MED ORDER — ALPRAZOLAM 0.5 MG PO TABS
0.2500 mg | ORAL_TABLET | Freq: Two times a day (BID) | ORAL | Status: DC | PRN
Start: 2024-05-01 — End: 2024-05-02

## 2024-05-01 MED ORDER — AMLODIPINE BESYLATE 5 MG PO TABS: 10.0000 mg | ORAL_TABLET | Freq: Every day | ORAL | Status: DC

## 2024-05-01 MED ORDER — SUCRALFATE 1 G PO TABS: 1.0000 g | ORAL_TABLET | Freq: Four times a day (QID) | ORAL | Status: DC

## 2024-05-01 MED ORDER — METOCLOPRAMIDE HCL 5 MG/ML IJ SOLN: 5.0000 mg | Freq: Four times a day (QID) | INTRAMUSCULAR | Status: DC | PRN

## 2024-05-01 MED ORDER — LORATADINE 10 MG PO TABS: 10.0000 mg | ORAL_TABLET | Freq: Every day | ORAL | Status: DC

## 2024-05-01 MED ORDER — ASPIRIN 81 MG PO TBEC
81.0000 mg | DELAYED_RELEASE_TABLET | Freq: Every day | ORAL | Status: DC
  Administered 2024-05-02: 81 mg via ORAL
  Filled 2024-05-01: qty 1

## 2024-05-01 MED ORDER — SODIUM CHLORIDE 0.9 % IV BOLUS
250.0000 mL | Freq: Once | INTRAVENOUS | Status: AC
  Administered 2024-05-01: 250 mL via INTRAVENOUS

## 2024-05-01 MED ORDER — PANTOPRAZOLE SODIUM 40 MG PO TBEC: 40.0000 mg | DELAYED_RELEASE_TABLET | Freq: Every day | ORAL | Status: DC

## 2024-05-01 MED ORDER — ROSUVASTATIN CALCIUM 20 MG PO TABS: 20.0000 mg | ORAL_TABLET | Freq: Every day | ORAL | Status: DC

## 2024-05-01 MED ORDER — DONEPEZIL HCL 5 MG PO TABS: 10.0000 mg | ORAL_TABLET | Freq: Every day | ORAL | Status: DC

## 2024-05-01 MED ORDER — MAGNESIUM HYDROXIDE 400 MG/5ML PO SUSP: 30.0000 mL | Freq: Every day | ORAL | Status: DC | PRN

## 2024-05-01 MED ORDER — VITAMIN C 100 MG PO TABS: 100.0000 mg | ORAL_TABLET | Freq: Every day | ORAL | Status: DC

## 2024-05-01 NOTE — Assessment & Plan Note (Signed)
 Will continue Requip

## 2024-05-01 NOTE — Assessment & Plan Note (Signed)
-   Will continue PPI therapy and Carafate .

## 2024-05-01 NOTE — ED Notes (Signed)
 Pt verbalizes consent to treatment and the understanding leaving prior to evaluation is at her own risk.

## 2024-05-01 NOTE — Assessment & Plan Note (Signed)
-   The patient will be hydrated with IV normal saline as mentioned above with added potassium chloride . - Will follow sodium level. - This is likely hypovolemic.

## 2024-05-01 NOTE — Assessment & Plan Note (Signed)
 Will continue statin therapy

## 2024-05-01 NOTE — Assessment & Plan Note (Signed)
 -  We will continue Neurontin. ?

## 2024-05-01 NOTE — Assessment & Plan Note (Signed)
-   The patient will be admitted to an observation cardiac telemetry bed. - Will follow serial troponins and EKGs. - The patient will be placed on aspirin  as well as p.r.n. sublingual nitroglycerin  and morphine  sulfate for pain. - We will obtain a cardiology consult in a.m. for further cardiac risk stratification. - I notified Dr. about the patient

## 2024-05-01 NOTE — H&P (Signed)
 [place]   PATIENT NAME: Elizabeth Mcintyre    MR#:  [id number]  DATE OF BIRTH:  02/04/1941  DATE OF ADMISSION:  05/01/2024  PRIMARY CARE PHYSICIAN: [name], MD   Patient is coming from: Home  REQUESTING/REFERRING PHYSICIAN: Suzette Pac, MD  CHIEF COMPLAINT:   Chief Complaint  Patient presents with   Chest Pain    HISTORY OF PRESENT ILLNESS:  Elizabeth Mcintyre is a 83 y.o. Caucasian female with medical history significant for asthma, essential hypertension, hypothyroidism, dementia, vitamin B12 deficiency, carotid artery stenosis, diverticulosis, GERD, gout, IBS and dyslipidemia, who presented to the emergency room with acute onset of midsternal chest pain felt chest tightness and graded 8/10 in severity with associated dyspnea which has been intermittent over the last week.  She has been having diaphoresis.  No nausea or vomiting or abdominal pain.  No cough or wheezing or hemoptysis.  No leg pain or edema or recent travels or surgeries.  No fever or chills.  No dysuria, oliguria or hematuria or flank pain.  No bleeding diathesis.  ED Course: When she came to the ER, BP was 162/87 with otherwise normal vital signs.  Labs revealed hyponatremia 129 hypokalemia 96 with hypokalemia of 3.3 and CO2 of 19 and a blood glucose of 182 with creatinine 1.87 above 1.19 on 08/03/2023 with a BUN of 10. EKG as reviewed by me : EKG showed sinus rhythm with rate of 91 with Q waves anteroseptally and inferiorly. Imaging: 2 view chest x-ray showed no acute cardiopulmonary disease.  The patient was given 250 mL IV normal saline, 10 mg an IV potassium chloride , 40 mg of IV Protonix , and 2 mg of IV morphine  sulfate.  She will be admitted to an observation telemetry bed for further evaluation and management. PAST MEDICAL HISTORY:   Past Medical History:  Diagnosis Date   Acquired hypothyroidism 05/30/2014   Acute cystitis without hematuria 10/09/2022   Acute pain of right shoulder  04/16/2022   Asthma    Bloating 04/26/2019   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   Cancer 2021   Lymphoma   Carotid artery stenosis 06/13/2016   Chronic SI joint pain    was on tramadol    Diverticulosis 03/2011   Dysphagia 04/18/2020   Essential hypertension, benign 11/09/2007   Current med losartan  100 mg qd, amlodopine 10 mg qd   Fall 07/22/2021   Fecal incontinence 07/07/2019   Gastroesophageal reflux disease with esophagitis 03/06/2015   Gout 06/09/2014   R great toe, ball of foot Stopped HCTZ several mos ago,  On losartan         Hematemesis 03/30/2020   Hepatic steatosis 01/04/2020   Herpes zoster without complication 12/02/2022   History of rheumatoid arthritis    during 30's, was treated.   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    Stress incontinence    Thrombocytopenia 01/04/2020   Type II diabetes mellitus 11/09/2007   diet control   Vitamin D  deficiency 05/25/2015    PAST SURGICAL HISTORY:   Past Surgical History:  Procedure Laterality Date   ABDOMINAL HYSTERECTOMY     BALLOON DILATION N/A 08/21/2020   Procedure: BALLOON DILATION;  Surgeon: Cindie Carlin POUR, DO;  [facility];  Service: Endoscopy;  Laterality: N/A;   BIOPSY  03/30/2020    Procedure: BIOPSY;  Surgeon: Donnald Charleston, MD;  Location: [facility];  Service: Endoscopy;;   BIOPSY  05/02/2020   Procedure: BIOPSY;  Surgeon: Eartha Flavors, Toribio, MD;  Location: [facility];  Service: Gastroenterology;;   CARDIAC CATHETERIZATION     X 2, last one in 1998   CHOLECYSTECTOMY N/A 04/13/2017   Procedure: LAPAROSCOPIC CHOLECYSTECTOMY;  Surgeon: Mavis Anes, MD;  Location: [facility];  Service: General;  Laterality: N/A;   COLONOSCOPY     COLONOSCOPY  May 2012   Dr. Obie: mild diverticulosis, otherwise normal.    COLONOSCOPY WITH PROPOFOL  N/A 05/02/2020   Dr. Eartha: 8 mm polyp removed from the ascending colon, 2 mm polyp removed from the ascending colon.  Tubular adenomas.  Diverticulosis.  Mucosal ulceration in the sigmoid colon noted, pathology consistent with ischemic colitis.   ESOPHAGOGASTRODUODENOSCOPY N/A 01/28/2015   Dr. Shaaron: reflux esophagitis, Schatzki's ring not manipulated due to recent bleeding   ESOPHAGOGASTRODUODENOSCOPY N/A 03/30/2015   Dr. Shaaron: Schatzki's ring s/p Agapito dilation, previously noted esophageal ulcer completely healed   ESOPHAGOGASTRODUODENOSCOPY N/A 03/30/2020   Buccini: Moderately severe erosive, circumferential, confluent esophagitis with no bleeding found 25 to 40 cm from incisors.  Nonobstructing and mild Schatzki ring, there were also multiple distal esophageal rings noted, minimal hiatal hernia.   ESOPHAGOGASTRODUODENOSCOPY (EGD) WITH PROPOFOL  N/A 08/21/2020   Procedure: ESOPHAGOGASTRODUODENOSCOPY (EGD) WITH PROPOFOL ;  Surgeon: Cindie Carlin POUR, DO;  [facility];  Service: Endoscopy;  Laterality: N/A;  2:15pm   IR IMAGING GUIDED PORT INSERTION  03/13/2020   IR REMOVAL TUN ACCESS W/ PORT W/O FL MOD SED  10/01/2021   LYMPH NODE BIOPSY Left 03/20/2020   Procedure: LEFT POSTERIOR CERVICAL LYMPH NODE BIOPSY;  Surgeon: Sebastian Moles, MD;  Location: [facility];  Service: General;  Laterality: Left;   MALONEY DILATION N/A  03/30/2015   Procedure: AGAPITO HODGKIN;  Surgeon: Charleston CHRISTELLA Shaaron, MD;  [facility];  Service: Endoscopy;  Laterality: N/A;   POLYPECTOMY  05/02/2020   Procedure: POLYPECTOMY;  Surgeon: Eartha Flavors, Toribio, MD;  [facility];  Service: Gastroenterology;;    SOCIAL HISTORY:   Social History   Tobacco Use   Smoking status: Never   Smokeless tobacco: Never  Substance Use Topics   Alcohol use: Never    FAMILY HISTORY:   Family History  Problem Relation Age of Onset   Heart disease Mother    Osteoarthritis Mother    Sudden death Father    Single kidney Father    Other Father        h/o severe MVA injuries   Hyperlipidemia Sister    Other Daughter        Myalgias   Fibromyalgia Daughter    Allergies Daughter    Heart disease Maternal Grandfather    Sudden death Paternal Grandmother    Diabetes Paternal Grandfather    Heart disease Daughter    Other Daughter  palpitations   Pulmonary fibrosis Maternal Aunt    Cancer Paternal Uncle    Pulmonary fibrosis Maternal Aunt    Colon cancer Neg Hx     DRUG ALLERGIES:   Allergies  Allergen Reactions   Codeine Nausea And Vomiting and Rash   Codeine Phosphate Nausea And Vomiting and Rash    REVIEW OF SYSTEMS:   ROS As per history of present illness. All pertinent systems were reviewed above. Constitutional, HEENT, cardiovascular, respiratory, GI, GU, musculoskeletal, neuro, psychiatric, endocrine, integumentary and hematologic systems were reviewed and are otherwise negative/unremarkable except for positive findings mentioned above in the HPI.   MEDICATIONS AT HOME:   Prior to Admission medications   Medication Sig Start Date End Date Taking? Authorizing Provider  acetaminophen  (TYLENOL ) 325 MG tablet Take 325 mg by mouth every 6 (six) hours as needed for moderate pain.     [provider]  albuterol  (VENTOLIN  HFA) 108 (90 Base) MCG/ACT inhaler Inhale 2 puffs into the lungs every  6 (six) hours as needed. 05/08/23   [provider]  amLODipine  (NORVASC ) 10 MG tablet Take 1 tablet (10 mg total) by mouth daily. 09/01/23   [name], MD  Ascorbic Acid (VITAMIN C ) 100 MG tablet Take 100 mg by mouth daily.    [provider]  carvedilol  (COREG ) 3.125 MG tablet Take 1 tablet (3.125 mg total) by mouth 2 (two) times daily with a meal. 09/01/23   Tobie, Suzzane POUR, MD  cetirizine (ZYRTEC) 10 MG tablet Take 10 mg by mouth at bedtime. As needed    [provider]  Cholecalciferol  (VITAMIN D3) 25 MCG (1000 UT) CAPS Take 1,000 Units by mouth daily.     [provider]  donepezil  (ARICEPT ) 10 MG tablet TAKE 1 TABLET BY MOUTH DAILY 06/22/23   Wertman, [name], PA-C  gabapentin  (NEURONTIN ) 100 MG capsule Take 1 capsule (100 mg total) by mouth daily. 09/01/23   [name], MD  levothyroxine  (SYNTHROID ) 75 MCG tablet Take 75 mcg by mouth daily. 03/27/23   [provider]  mirtazapine  (REMERON ) 15 MG tablet TAKE 1 TABLET BY MOUTH EVERYDAY AT BEDTIME 09/28/23   [name], MD  pantoprazole  (PROTONIX ) 40 MG tablet Take 40 mg by mouth daily. 03/31/23   [provider]  rOPINIRole  (REQUIP ) 0.25 MG tablet Take 0.25 mg by mouth at bedtime. 01/30/23   [provider]  rosuvastatin  (CRESTOR ) 20 MG tablet Take 20 mg by mouth daily. 01/30/23   [provider]  sucralfate  (CARAFATE ) 1 g tablet TAKE 1 TABLET BY MOUTH 4 TIMES DAILY. 09/28/23   [name], MD  vitamin B-12 (CYANOCOBALAMIN ) 1000 MCG tablet Take 1,000 mcg by mouth daily.     [provider]      VITAL SIGNS:  Blood pressure (!) 162/98, pulse 83, resp. rate (!) 22, height 5' 4 (1.626 m), weight 63.5 kg, SpO2 97%.  PHYSICAL EXAMINATION:  Physical Exam  GENERAL:  83 y.o.-year-old Caucasian female patient lying in the bed with no acute distress.  EYES: Pupils equal, round, reactive to light and accommodation. No scleral icterus. Extraocular muscles  intact.  HEENT: Head atraumatic, normocephalic. Oropharynx and nasopharynx clear.  NECK:  Supple, no jugular venous distention. No thyroid  enlargement, no tenderness.  LUNGS: Normal breath sounds bilaterally, no wheezing, rales,rhonchi or crepitation. No use of accessory muscles of respiration.  CARDIOVASCULAR: Regular rate and rhythm, S1, S2 normal. No murmurs, rubs, or gallops.  ABDOMEN: Soft, nondistended, nontender. Bowel sounds  present. No organomegaly or mass.  EXTREMITIES: No pedal edema, cyanosis, or clubbing.  NEUROLOGIC: Cranial nerves II through XII are intact. Muscle strength 5/5 in all extremities. Sensation intact. Gait not checked.  PSYCHIATRIC: The patient is alert and oriented x 3.  Normal affect and good eye contact. SKIN: No obvious rash, lesion, or ulcer.   LABORATORY PANEL:   CBC Recent Labs  Lab 05/01/24 1747  WBC 7.6  HGB 14.5  HCT 40.7  PLT 209   ------------------------------------------------------------------------------------------------------------------  Chemistries  Recent Labs  Lab 05/01/24 1630 05/01/24 1700  NA 129*  --   K 3.3*  --   CL 96*  --   CO2 19*  --   GLUCOSE 182*  --   BUN 10  --   CREATININE 1.87*  --   CALCIUM  8.9  --   AST  --  65*  ALT  --  50*  ALKPHOS  --  65  BILITOT  --  1.8*   ------------------------------------------------------------------------------------------------------------------  Cardiac Enzymes No results for input(s): TROPONINI in the last 168 hours. ------------------------------------------------------------------------------------------------------------------  RADIOLOGY:  DG Chest 2 View Result Date: 05/01/2024 CLINICAL DATA:  Central chest tightness for 2 days, back and neck pain EXAM: CHEST - 2 VIEW COMPARISON:  05/17/2023 FINDINGS: Frontal and lateral views of the chest demonstrate a stable cardiac silhouette. Atherosclerosis of the thoracic aorta again noted. No airspace disease, effusion,  or pneumothorax. No acute bony abnormalities. IMPRESSION: 1. Stable chest, no acute process. Electronically Signed   By: Ozell Daring M.D.   On: 05/01/2024 17:14      IMPRESSION AND PLAN:  Assessment and Plan: * Chest pain - The patient will be admitted to an observation cardiac telemetry bed. - Will follow serial troponins and EKGs. - The patient will be placed on aspirin  as well as p.r.n. sublingual nitroglycerin  and morphine  sulfate for pain. - We will obtain a cardiology consult in a.m. for further cardiac risk stratification. - I notified Dr. about the patient   Acute kidney injury superimposed on chronic kidney disease (HCC) - This is AKI superimposed on stage IIIa chronic kidney disease. - The patient will be hydrated with IV normal saline with added potassium chloride . - Will follow BMP. - Will avoid nephrotoxins.  Hypokalemia - We will replace potassium and check magnesium  level.  Hyponatremia - The patient will be hydrated with IV normal saline as mentioned above with added potassium chloride . - Will follow sodium level. - This is likely hypovolemic.  GERD without esophagitis - Will continue PPI therapy and Carafate .  Hypothyroidism - Will continue Synthroid .  Essential hypertension - Will continue antihypertensive therapy.  Dyslipidemia - Will continue statin therapy.  Peripheral neuropathy - We will continue Neurontin .  Restless legs syndrome - Will continue Requip .   DVT prophylaxis: Lovenox .  Advanced Care Planning:  Code Status: full code.  Family Communication:  The plan of care was discussed in details with the patient (and family). I answered all questions. The patient agreed to proceed with the above mentioned plan. Further management will depend upon hospital course. Disposition Plan: Back to previous home environment Consults called: none.  All the records are reviewed and case discussed with ED provider.  Status is: Observation  I  certify that at the time of admission, it is my clinical judgment that the patient will require  hospital care extending less than 2 midnights.  Dispo: The patient is from: Home              Anticipated d/c is to: Home              Patient currently is not medically stable to d/c.              Difficult to place patient: No  Madison DELENA Peaches M.D on 05/01/2024 at 9:49 PM  [facility]   From 7 PM-7 AM, contact night-coverage [email or web address]  CC: Primary care physician; [name], MD

## 2024-05-01 NOTE — ED Provider Notes (Signed)
 [place] EMERGENCY DEPARTMENT AT [facility] Provider Note   CSN: [id number] Arrival date & time: 05/01/24  1603     Patient presents with: Chest Pain   Elizabeth Mcintyre is a 83 y.o. female.  {Add pertinent medical, surgical, social history, OB history to YEP:67052} Patient states that she has been having chest discomfort and shortness of breath periodically for the last week.  Today she had worsening symptoms that occurred lasting about 30 minutes with sweating.  Patient has a history of hypertension   Chest Pain      Prior to Admission medications   Medication Sig Start Date End Date Taking? Authorizing Provider  acetaminophen  (TYLENOL ) 325 MG tablet Take 325 mg by mouth every 6 (six) hours as needed for moderate pain.     [provider]  albuterol  (VENTOLIN  HFA) 108 (90 Base) MCG/ACT inhaler Inhale 2 puffs into the lungs every 6 (six) hours as needed. 05/08/23   [provider]  amLODipine  (NORVASC ) 10 MG tablet Take 1 tablet (10 mg total) by mouth daily. 09/01/23   [name], MD  Ascorbic Acid (VITAMIN C ) 100 MG tablet Take 100 mg by mouth daily.    [provider]  carvedilol  (COREG ) 3.125 MG tablet Take 1 tablet (3.125 mg total) by mouth 2 (two) times daily with a meal. 09/01/23   Tobie, Suzzane POUR, MD  cetirizine (ZYRTEC) 10 MG tablet Take 10 mg by mouth at bedtime. As needed    [provider]  Cholecalciferol  (VITAMIN D3) 25 MCG (1000 UT) CAPS Take 1,000 Units by mouth daily.     [provider]  donepezil  (ARICEPT ) 10 MG tablet TAKE 1 TABLET BY MOUTH DAILY 06/22/23   [name], [name], PA-C  gabapentin  (NEURONTIN ) 100 MG capsule Take 1 capsule (100 mg total) by mouth daily. 09/01/23   [name], MD  levothyroxine  (SYNTHROID ) 75 MCG tablet Take 75 mcg by mouth daily. 03/27/23   [provider]  mirtazapine  (REMERON ) 15 MG tablet TAKE 1 TABLET BY MOUTH EVERYDAY AT BEDTIME 09/28/23   [name], MD   pantoprazole  (PROTONIX ) 40 MG tablet Take 40 mg by mouth daily. 03/31/23   [provider]  rOPINIRole  (REQUIP ) 0.25 MG tablet Take 0.25 mg by mouth at bedtime. 01/30/23   [provider]  rosuvastatin  (CRESTOR ) 20 MG tablet Take 20 mg by mouth daily. 01/30/23   [provider]  sucralfate  (CARAFATE ) 1 g tablet TAKE 1 TABLET BY MOUTH 4 TIMES DAILY. 09/28/23   [name], MD  vitamin B-12 (CYANOCOBALAMIN ) 1000 MCG tablet Take 1,000 mcg by mouth daily.     [provider]    Allergies: Codeine and Codeine phosphate    Review of Systems  Cardiovascular:  Positive for chest pain.    Updated Vital Signs BP (!) 162/87   Pulse 91   Resp 13   Ht 5' 4 (1.626 m)   Wt 63.5 kg   SpO2 92%   BMI 24.03 kg/m   Physical Exam  (all labs ordered are listed, but only abnormal results are displayed) Labs Reviewed  BASIC METABOLIC PANEL WITH GFR - Abnormal; Notable for the following components:      Result Value   Sodium 129 (*)    Potassium 3.3 (*)    Chloride 96 (*)    CO2 19 (*)    Glucose, Bld 182 (*)    Creatinine, Ser 1.87 (*)    GFR, Estimated 27 (*)  All other components within normal limits  HEPATIC FUNCTION PANEL - Abnormal; Notable for the following components:   AST 65 (*)    ALT 50 (*)    Total Bilirubin 1.8 (*)    Bilirubin, Direct 0.5 (*)    Indirect Bilirubin 1.3 (*)    All other components within normal limits  CBC  D-DIMER, QUANTITATIVE (NOT AT [facility])  TROPONIN I (HIGH SENSITIVITY)  TROPONIN I (HIGH SENSITIVITY)    EKG: EKG Interpretation Date/Time:  Sunday May 01 2024 16:15:37 EDT Ventricular Rate:  91 PR Interval:  162 QRS Duration:  96 QT Interval:  400 QTC Calculation: 493 R Axis:   17  Text Interpretation: Sinus rhythm Probable anterior infarct, age indeterminate Baseline wander in lead(s) II III aVL aVF V1 V2 V3 V4 V6 Confirmed by Suzette Pac [phone]) on 05/01/2024 4:52:32 PM  Radiology: [facility] Chest 2  View Result Date: 05/01/2024 CLINICAL DATA:  Central chest tightness for 2 days, back and neck pain EXAM: CHEST - 2 VIEW COMPARISON:  05/17/2023 FINDINGS: Frontal and lateral views of the chest demonstrate a stable cardiac silhouette. Atherosclerosis of the thoracic aorta again noted. No airspace disease, effusion, or pneumothorax. No acute bony abnormalities. IMPRESSION: 1. Stable chest, no acute process. Electronically Signed   By: Ozell Daring M.D.   On: 05/01/2024 17:14    {Document cardiac monitor, telemetry assessment procedure when appropriate:32947} Procedures   Medications Ordered in the ED  pantoprazole  (PROTONIX ) injection 40 mg (40 mg Intravenous Given 05/01/24 1740)  morphine  (PF) 2 MG/ML injection 2 mg (2 mg Intravenous Given 05/01/24 1739)  potassium chloride  10 mEq in 100 mL IVPB (0 mEq Intravenous Stopped 05/01/24 1851)  sodium chloride  0.9 % bolus 250 mL (0 mLs Intravenous Stopped 05/01/24 1851)      {Click here for ABCD2, HEART and other calculators REFRESH Note before signing:1}                              Medical Decision Making Amount and/or Complexity of Data Reviewed Labs: ordered.  Risk Prescription drug management.   Patient with chest pain.  She will be admitted to medicine and possibly have cardiology consult  {Document critical care time when appropriate  Document review of labs and clinical decision tools ie CHADS2VASC2, etc  Document your independent review of radiology images and any outside records  Document your discussion with family members, caretakers and with consultants  Document social determinants of health affecting pt's care  Document your decision making why or why not admission, treatments were needed:32947:::1}   Final diagnoses:  None    ED Discharge Orders     None

## 2024-05-01 NOTE — ED Triage Notes (Signed)
 Pt to the ED with chest pain described and center chest tightness that began 2 days ago.  Pt states she is also having back, neck and head pain.

## 2024-05-01 NOTE — Assessment & Plan Note (Signed)
-   This is AKI superimposed on stage IIIa chronic kidney disease. - The patient will be hydrated with IV normal saline with added potassium chloride . - Will follow BMP. - Will avoid nephrotoxins.

## 2024-05-01 NOTE — ED Notes (Signed)
 Pt ambulatory to Bathroom with nurse standing by

## 2024-05-01 NOTE — Assessment & Plan Note (Addendum)
-   We will replace potassium and check magnesium level.

## 2024-05-01 NOTE — Assessment & Plan Note (Signed)
 Will continue Synthroid .

## 2024-05-01 NOTE — Assessment & Plan Note (Signed)
-   Will continue antihypertensive therapy.

## 2024-05-02 ENCOUNTER — Observation Stay (HOSPITAL_BASED_OUTPATIENT_CLINIC_OR_DEPARTMENT_OTHER)

## 2024-05-02 ENCOUNTER — Encounter (HOSPITAL_COMMUNITY): Payer: Self-pay | Admitting: Family Medicine

## 2024-05-02 DIAGNOSIS — N1831 Chronic kidney disease, stage 3a: Secondary | ICD-10-CM | POA: Diagnosis not present

## 2024-05-02 DIAGNOSIS — E785 Hyperlipidemia, unspecified: Secondary | ICD-10-CM | POA: Diagnosis not present

## 2024-05-02 DIAGNOSIS — K219 Gastro-esophageal reflux disease without esophagitis: Secondary | ICD-10-CM

## 2024-05-02 DIAGNOSIS — E876 Hypokalemia: Secondary | ICD-10-CM | POA: Diagnosis not present

## 2024-05-02 DIAGNOSIS — E039 Hypothyroidism, unspecified: Secondary | ICD-10-CM

## 2024-05-02 DIAGNOSIS — R079 Chest pain, unspecified: Secondary | ICD-10-CM

## 2024-05-02 DIAGNOSIS — N189 Chronic kidney disease, unspecified: Secondary | ICD-10-CM | POA: Diagnosis not present

## 2024-05-02 DIAGNOSIS — I10 Essential (primary) hypertension: Secondary | ICD-10-CM | POA: Diagnosis not present

## 2024-05-02 DIAGNOSIS — E871 Hypo-osmolality and hyponatremia: Secondary | ICD-10-CM | POA: Diagnosis not present

## 2024-05-02 DIAGNOSIS — N179 Acute kidney failure, unspecified: Secondary | ICD-10-CM | POA: Diagnosis not present

## 2024-05-02 LAB — BASIC METABOLIC PANEL WITH GFR
Anion gap: 9 (ref 5–15)
BUN: 10 mg/dL (ref 8–23)
CO2: 24 mmol/L (ref 22–32)
Calcium: 8.8 mg/dL — ABNORMAL LOW (ref 8.9–10.3)
Chloride: 106 mmol/L (ref 98–111)
Creatinine, Ser: 1.5 mg/dL — ABNORMAL HIGH (ref 0.44–1.00)
GFR, Estimated: 35 mL/min — ABNORMAL LOW (ref >60)
Glucose, Bld: 128 mg/dL — ABNORMAL HIGH (ref 70–99)
Potassium: 4.2 mmol/L (ref 3.5–5.1)
Sodium: 139 mmol/L (ref 135–145)

## 2024-05-02 LAB — NM MYOCAR MULTI W/SPECT W/WALL MOTION / EF
LV dias vol: 27 mL (ref 46–106)
LV sys vol: 1 mL (ref 3.8–5.2)
MPHR: 138 {beats}/min
Nuc Stress EF: 97 %
Peak HR: 116 {beats}/min
Percent HR: 84 %
RATE: 0.4
Rest HR: 67 {beats}/min
Rest Nuclear Isotope Dose: 11 mCi
SDS: 1
SRS: 0
SSS: 1
ST Depression (mm): 0 mm
Stress Nuclear Isotope Dose: 33 mCi
TID: 0.91

## 2024-05-02 LAB — HEPATIC FUNCTION PANEL
ALT: 41 U/L (ref 0–44)
AST: 46 U/L — ABNORMAL HIGH (ref 15–41)
Albumin: 3.4 g/dL — ABNORMAL LOW (ref 3.5–5.0)
Alkaline Phosphatase: 61 U/L (ref 38–126)
Bilirubin, Direct: 0.2 mg/dL (ref 0.0–0.2)
Indirect Bilirubin: 0 mg/dL — ABNORMAL LOW (ref 0.3–0.9)
Total Bilirubin: 0.2 mg/dL (ref 0.0–1.2)
Total Protein: 6 g/dL — ABNORMAL LOW (ref 6.5–8.1)

## 2024-05-02 MED ORDER — TECHNETIUM TC 99M TETROFOSMIN IV KIT
30.0000 | PACK | Freq: Once | INTRAVENOUS | Status: AC | PRN
  Administered 2024-05-02: 33 via INTRAVENOUS

## 2024-05-02 MED ORDER — LACTATED RINGERS IV SOLN: INTRAVENOUS | Status: DC

## 2024-05-02 MED ORDER — MAGNESIUM SULFATE 4 GM/100ML IV SOLN
4.0000 g | Freq: Once | INTRAVENOUS | Status: AC
  Administered 2024-05-02: 4 g via INTRAVENOUS
  Filled 2024-05-02: qty 100

## 2024-05-02 MED ORDER — TECHNETIUM TC 99M TETROFOSMIN IV KIT
10.0000 | PACK | Freq: Once | INTRAVENOUS | Status: AC | PRN
  Administered 2024-05-02: 11 via INTRAVENOUS

## 2024-05-02 MED ORDER — REGADENOSON 0.4 MG/5ML IV SOLN
INTRAVENOUS | Status: AC
  Administered 2024-05-02: 0.4 mg via INTRAVENOUS
  Filled 2024-05-02: qty 5

## 2024-05-02 MED ORDER — SODIUM CHLORIDE FLUSH 0.9 % IV SOLN
INTRAVENOUS | Status: AC
  Filled 2024-05-02: qty 10

## 2024-05-02 NOTE — Progress Notes (Signed)
 Patient has no c/o chest pain or discomfort noted at this time.Nuclear med tech at the bedside, explaining the procedure of the Nuclear med stress test to patient and family, verbalized understanding.Plan of care on going.

## 2024-05-02 NOTE — TOC CM/SW Note (Signed)
 Transition of Care [facility]) - Inpatient Brief Assessment   Patient Details  Name: Elizabeth Mcintyre MRN: [id number] Date of Birth: 1941/06/11  Transition of Care [facility] CM/SW Contact:    Lucie Lunger, LCSWA Phone Number: 05/02/2024, 9:26 AM   Clinical Narrative: Transition of Care Department [facility]) has reviewed patient and no TOC needs have been identified at this time. We will continue to monitor patient advancement through interdiciplinary progression rounds. If new patient transition needs arise, please place a TOC consult.  Transition of Care Asessment: Insurance and Status: Insurance coverage has been reviewed Patient has primary care physician: Yes Home environment has been reviewed: From home Prior level of function:: Independent Prior/Current Home Services: No current home services Social Drivers of Health Review: SDOH reviewed no interventions necessary Readmission risk has been reviewed: Yes Transition of care needs: no transition of care needs at this time

## 2024-05-02 NOTE — Progress Notes (Signed)
     Winton CHRISTELLA Pesa presented for a Lexiscan  nuclear stress test today.  I Lorette CINDERELLA Kapur, PA-C, provided direct supervision and was present during the stress portion of the study today, which was completed without significant symptoms, immediate complications, or acute ST/T changes on ECG.  Stress imaging is pending at this time.  Preliminary ECG findings may be listed in the chart, but the stress test result will not be finalized until perfusion imaging is complete.  Lorette CINDERELLA Kapur, PA-C  05/02/2024, 12:47 PM

## 2024-05-02 NOTE — Discharge Summary (Addendum)
 Physician Discharge Summary  Elizabeth Mcintyre FMW:[id number] DOB: Apr 30, 1941 DOA: 05/01/2024  PCP: [name], MD  Admit date: 05/01/2024 Discharge date: 05/02/2024  Admitted From:  home  Disposition: home   Recommendations for Outpatient Follow-up:  Follow up with PCP in 1 weeks Please obtain CMP, Mg in 1-2 weeks  Discharge Condition: STABLE   CODE STATUS: FULL DIET: heart healthy    Brief Hospitalization Summary: Please see all hospital notes, images, labs for full details of the hospitalization. Admission provider HPI:  83 y.o. Caucasian female with medical history significant for asthma, essential hypertension, hypothyroidism, dementia, vitamin B12 deficiency, carotid artery stenosis, diverticulosis, GERD, gout, IBS and dyslipidemia, who presented to the emergency room with acute onset of midsternal chest pain felt chest tightness and graded 8/10 in severity with associated dyspnea which has been intermittent over the last week.  She has been having diaphoresis.  No nausea or vomiting or abdominal pain.  No cough or wheezing or hemoptysis.  No leg pain or edema or recent travels or surgeries.  No fever or chills.  No dysuria, oliguria or hematuria or flank pain.  No bleeding diathesis.   ED Course: When she came to the ER, BP was 162/87 with otherwise normal vital signs.  Labs revealed hyponatremia 129 hypokalemia 96 with hypokalemia of 3.3 and CO2 of 19 and a blood glucose of 182 with creatinine 1.87 above 1.19 on 08/03/2023 with a BUN of 10. EKG as reviewed by me : EKG showed sinus rhythm with rate of 91 with Q waves anteroseptally and inferiorly. Imaging: 2 view chest x-ray showed no acute cardiopulmonary disease.   The patient was given 250 mL IV normal saline, 10 mg an IV potassium chloride , 40 mg of IV Protonix , and 2 mg of IV morphine  sulfate.  She will be admitted to an observation telemetry bed for further evaluation and management.  Hospital Course by listed problems    Atypical Chest pain - ACS Ruled Out  - The patient was admitted to an observation cardiac telemetry bed. - serial troponins and EKGs reassuring - ACS ruled out  - cardiology consulted and planning for nuclear stress test/echocardiogram-- this was a normal nuclear study with no findings of ischemia or infarction, low risk study, LV perfusion is normal. There is no evidence of ischemia. There is no evidence of infarction.   Left ventricular function is normal. Nuclear stress EF: 97%.   Hyponatremia - resolved  - The patient will be hydrated with IV normal saline as mentioned above with added potassium chloride . - This was hypovolemic hyponatremia   Acute kidney injury superimposed on chronic kidney disease - This is AKI superimposed on stage IIIa chronic kidney disease. - The patient was hydrated with IV normal saline with added potassium chloride . - creatinine improved to 1.50 - avoid nephrotoxins. -- repeat CMP in 1-2 weeks with PCP recommended   Peripheral neuropathy - continue Neurontin .   Hypothyroidism - continue Synthroid .   GERD without esophagitis - continue PPI therapy and Carafate .   Hypokalemia - repleted - repleted    Restless legs syndrome - Will continue Requip .   Essential hypertension - Will continue antihypertensive therapy.   Dyslipidemia - Will continue statin therapy.   Discharge Diagnoses:  Principal Problem:   Chest pain Active Problems:   Dyslipidemia   Essential hypertension   Restless legs syndrome   Hypokalemia   GERD without esophagitis   Hypothyroidism   Peripheral neuropathy   Acute kidney injury superimposed on chronic kidney disease (  HCC)   Hyponatremia   Discharge Instructions:  Allergies as of 05/02/2024       Reactions   Codeine Nausea And Vomiting, Rash   Codeine Phosphate Nausea And Vomiting, Rash        Medication List     TAKE these medications    acetaminophen  325 MG tablet Commonly known as: TYLENOL  Take  325 mg by mouth every 6 (six) hours as needed for moderate pain.   albuterol  108 (90 Base) MCG/ACT inhaler Commonly known as: VENTOLIN  HFA Inhale 2 puffs into the lungs every 6 (six) hours as needed.   amLODipine  10 MG tablet Commonly known as: NORVASC  Take 1 tablet (10 mg total) by mouth daily.   carvedilol  3.125 MG tablet Commonly known as: COREG  Take 1 tablet (3.125 mg total) by mouth 2 (two) times daily with a meal.   cetirizine 10 MG tablet Commonly known as: ZYRTEC Take 10 mg by mouth at bedtime. As needed   cyanocobalamin  1000 MCG tablet Commonly known as: VITAMIN B12 Take 1,000 mcg by mouth daily.   donepezil  10 MG tablet Commonly known as: ARICEPT  TAKE 1 TABLET BY MOUTH DAILY   gabapentin  100 MG capsule Commonly known as: NEURONTIN  Take 1 capsule (100 mg total) by mouth daily.   levothyroxine  75 MCG tablet Commonly known as: SYNTHROID  Take 75 mcg by mouth daily.   mirtazapine  15 MG tablet Commonly known as: REMERON  TAKE 1 TABLET BY MOUTH EVERYDAY AT BEDTIME   pantoprazole  40 MG tablet Commonly known as: PROTONIX  Take 40 mg by mouth daily.   rOPINIRole  0.25 MG tablet Commonly known as: REQUIP  Take 0.25 mg by mouth at bedtime.   rosuvastatin  20 MG tablet Commonly known as: CRESTOR  Take 20 mg by mouth daily.   sucralfate  1 g tablet Commonly known as: CARAFATE  TAKE 1 TABLET BY MOUTH 4 TIMES DAILY.   vitamin C  100 MG tablet Take 100 mg by mouth daily.   Vitamin D3 25 MCG (1000 UT) Caps Take 1,000 Units by mouth daily.        Follow-up Information     [name], MD. Schedule an appointment as soon as possible for a visit in 1 week(s).   Specialty: Internal Medicine Why: Hospital Follow Up Contact information: [address] [place] [address] [phone]                Allergies  Allergen Reactions   Codeine Nausea And Vomiting and Rash   Codeine Phosphate Nausea And Vomiting and Rash   Allergies as of 05/02/2024        Reactions   Codeine Nausea And Vomiting, Rash   Codeine Phosphate Nausea And Vomiting, Rash        Medication List     TAKE these medications    acetaminophen  325 MG tablet Commonly known as: TYLENOL  Take 325 mg by mouth every 6 (six) hours as needed for moderate pain.   albuterol  108 (90 Base) MCG/ACT inhaler Commonly known as: VENTOLIN  HFA Inhale 2 puffs into the lungs every 6 (six) hours as needed.   amLODipine  10 MG tablet Commonly known as: NORVASC  Take 1 tablet (10 mg total) by mouth daily.   carvedilol  3.125 MG tablet Commonly known as: COREG  Take 1 tablet (3.125 mg total) by mouth 2 (two) times daily with a meal.   cetirizine 10 MG tablet Commonly known as: ZYRTEC Take 10 mg by mouth at bedtime. As needed   cyanocobalamin  1000 MCG tablet Commonly known as: VITAMIN B12  Take 1,000 mcg by mouth daily.   donepezil  10 MG tablet Commonly known as: ARICEPT  TAKE 1 TABLET BY MOUTH DAILY   gabapentin  100 MG capsule Commonly known as: NEURONTIN  Take 1 capsule (100 mg total) by mouth daily.   levothyroxine  75 MCG tablet Commonly known as: SYNTHROID  Take 75 mcg by mouth daily.   mirtazapine  15 MG tablet Commonly known as: REMERON  TAKE 1 TABLET BY MOUTH EVERYDAY AT BEDTIME   pantoprazole  40 MG tablet Commonly known as: PROTONIX  Take 40 mg by mouth daily.   rOPINIRole  0.25 MG tablet Commonly known as: REQUIP  Take 0.25 mg by mouth at bedtime.   rosuvastatin  20 MG tablet Commonly known as: CRESTOR  Take 20 mg by mouth daily.   sucralfate  1 g tablet Commonly known as: CARAFATE  TAKE 1 TABLET BY MOUTH 4 TIMES DAILY.   vitamin C  100 MG tablet Take 100 mg by mouth daily.   Vitamin D3 25 MCG (1000 UT) Caps Take 1,000 Units by mouth daily.        Procedures/Studies: NM Myocar Multi W/Spect W/Wall Motion / EF Result Date: 05/02/2024   No ST deviation was noted. Pharmacological protocol is used.   LV perfusion is normal. There is no evidence of  ischemia. There is no evidence of infarction.   Left ventricular function is normal. Nuclear stress EF: 97%.   Findings are consistent with no ischemia and no infarction. The study is low risk.   DG Chest 2 View Result Date: 05/01/2024 CLINICAL DATA:  Central chest tightness for 2 days, back and neck pain EXAM: CHEST - 2 VIEW COMPARISON:  05/17/2023 FINDINGS: Frontal and lateral views of the chest demonstrate a stable cardiac silhouette. Atherosclerosis of the thoracic aorta again noted. No airspace disease, effusion, or pneumothorax. No acute bony abnormalities. IMPRESSION: 1. Stable chest, no acute process. Electronically Signed   By: Ozell Daring M.D.   On: 05/01/2024 17:14     Subjective: Pt reports no more chest pain and she is eager to go home today  Discharge Exam: Vitals:   05/02/24 0945 05/02/24 1357  BP: (!) 148/85 128/63  Pulse: (!) 58 77  Resp:  18  Temp: 97.6 F (36.4 C) 97.8 F (36.6 C)  SpO2: 98% 99%   Vitals:   05/02/24 0600 05/02/24 0800 05/02/24 0945 05/02/24 1357  BP: (!) 151/52 (!) 143/71 (!) 148/85 128/63  Pulse: 61 63 (!) 58 77  Resp: 12 12  18   Temp:   97.6 F (36.4 C) 97.8 F (36.6 C)  TempSrc:   Oral   SpO2: 97% 100% 98% 99%  Weight:   62.9 kg   Height:   5' 1 (1.549 m)    General: Pt is alert, awake, not in acute distress Cardiovascular: normal S1/S2 +, no rubs, no gallops Respiratory: CTA bilaterally, no wheezing, no rhonchi Abdominal: Soft, NT, ND, bowel sounds + Extremities: trace pretibial edema, no cyanosis   The results of significant diagnostics from this hospitalization (including imaging, microbiology, ancillary and laboratory) are listed below for reference.     Microbiology: No results found for this or any previous visit (from the past 240 hours).   Labs: BNP (last 3 results) No results for input(s): BNP in the last 8760 hours. Basic Metabolic Panel: Recent Labs  Lab 05/01/24 1630 05/01/24 1906 05/02/24 0823  NA 129*  --   139  K 3.3*  --  4.2  CL 96*  --  106  CO2 19*  --  24  GLUCOSE 182*  --  128*  BUN 10  --  10  CREATININE 1.87*  --  1.50*  CALCIUM  8.9  --  8.8*  MG  --  1.6*  --    Liver Function Tests: Recent Labs  Lab 05/01/24 1700 05/02/24 0823  AST 65* 46*  ALT 50* 41  ALKPHOS 65 61  BILITOT 1.8* 0.2  PROT 7.0 6.0*  ALBUMIN 4.0 3.4*   No results for input(s): LIPASE, AMYLASE in the last 168 hours. No results for input(s): AMMONIA in the last 168 hours. CBC: Recent Labs  Lab 05/01/24 1747  WBC 7.6  HGB 14.5  HCT 40.7  MCV 84.6  PLT 209   Cardiac Enzymes: No results for input(s): CKTOTAL, CKMB, CKMBINDEX, TROPONINI in the last 168 hours. BNP: Invalid input(s): POCBNP CBG: No results for input(s): GLUCAP in the last 168 hours. D-Dimer Recent Labs    05/01/24 1747  DDIMER 0.49   Hgb A1c No results for input(s): HGBA1C in the last 72 hours. Lipid Profile No results for input(s): CHOL, HDL, LDLCALC, TRIG, CHOLHDL, LDLDIRECT in the last 72 hours. Thyroid  function studies No results for input(s): TSH, T4TOTAL, T3FREE, THYROIDAB in the last 72 hours.  Invalid input(s): FREET3 Anemia work up No results for input(s): VITAMINB12, FOLATE, FERRITIN, TIBC, IRON, RETICCTPCT in the last 72 hours. Urinalysis    Component Value Date/Time   COLORURINE YELLOW 05/17/2023 2225   APPEARANCEUR Hazy (A) 06/17/2023 1542   LABSPEC 1.005 05/17/2023 2225   PHURINE 7.0 05/17/2023 2225   GLUCOSEU Negative 06/17/2023 1542   GLUCOSEU NEGATIVE 10/15/2015 1309   HGBUR NEGATIVE 05/17/2023 2225   HGBUR trace-intact 11/26/2010 1021   BILIRUBINUR 1+ (A) 06/17/2023 1542   KETONESUR 20 (A) 05/17/2023 2225   PROTEINUR 1+ (A) 06/17/2023 1542   PROTEINUR NEGATIVE 05/17/2023 2225   UROBILINOGEN 0.2 06/06/2016 0932   UROBILINOGEN 0.2 10/15/2015 1309   NITRITE Negative 06/17/2023 1542   NITRITE NEGATIVE 05/17/2023 2225   LEUKOCYTESUR Negative  06/17/2023 1542   LEUKOCYTESUR LARGE (A) 05/17/2023 2225   Sepsis Labs Recent Labs  Lab 05/01/24 1747  WBC 7.6   Microbiology No results found for this or any previous visit (from the past 240 hours).  Time coordinating discharge: 25 mins  SIGNED:  Afton Louder, MD  [facility] 05/02/2024, 3:34 PM How to contact the [facility] Attending or Consulting provider 7A - 7P or covering provider during after hours 7P -7A, for this patient?  Check the care team in [facility] and look for a) attending/consulting [facility] provider listed and b) the [facility] team listed Log into [email or web address] and use [place]'s universal password to access. If you do not have the password, please contact the hospital operator. Locate the [facility] provider you are looking for under [facility] and page to a number that you can be directly reached. If you still have difficulty reaching the provider, please page the [facility] (Director on Call) for the Hospitalists listed on amion for assistance.

## 2024-05-02 NOTE — Hospital Course (Addendum)
 83 y.o. Caucasian female with medical history significant for asthma, essential hypertension, hypothyroidism, dementia, vitamin B12 deficiency, carotid artery stenosis, diverticulosis, GERD, gout, IBS and dyslipidemia, who presented to the emergency room with acute onset of midsternal chest pain felt chest tightness and graded 8/10 in severity with associated dyspnea which has been intermittent over the last week.  She has been having diaphoresis.  No nausea or vomiting or abdominal pain.  No cough or wheezing or hemoptysis.  No leg pain or edema or recent travels or surgeries.  No fever or chills.  No dysuria, oliguria or hematuria or flank pain.  No bleeding diathesis.   ED Course: When she came to the ER, BP was 162/87 with otherwise normal vital signs.  Labs revealed hyponatremia 129 hypokalemia 96 with hypokalemia of 3.3 and CO2 of 19 and a blood glucose of 182 with creatinine 1.87 above 1.19 on 08/03/2023 with a BUN of 10. EKG as reviewed by me : EKG showed sinus rhythm with rate of 91 with Q waves anteroseptally and inferiorly. Imaging: 2 view chest x-ray showed no acute cardiopulmonary disease.   The patient was given 250 mL IV normal saline, 10 mg an IV potassium chloride , 40 mg of IV Protonix , and 2 mg of IV morphine  sulfate.  She will be admitted to an observation telemetry bed for further evaluation and management.

## 2024-05-02 NOTE — Plan of Care (Signed)
°  Problem: Activity: °Goal: Ability to tolerate increased activity will improve °Outcome: Progressing °  °Problem: Activity: °Goal: Ability to tolerate increased activity will improve °Outcome: Progressing °  °

## 2024-05-02 NOTE — Progress Notes (Signed)
Patient discharged home with instructions given on medications and follow up visits,patient and family verbalized understanding.IV discontinued,catheter intact. Accompanied by staff to an awaiting vehicle.

## 2024-05-02 NOTE — ED Notes (Signed)
 Per offgoing RN the pt was unable to state what medications she takes and when she last took any of her medications. This RN spoke with pharmacy re: medications ordered at 8/17 21:30. Pharmacy to review refill history and will reschedule the pts daily medications as needed.

## 2024-05-02 NOTE — Progress Notes (Signed)
 Nurse at bedside,patient alert and oriented time four.Patient c/o pain to right Antecubital at IV site, redness noted to the area. IV discontinue,catheter intact.New IV started 22 gauge to left posterior forearm times one attempt,patient tolerated procedure.

## 2024-05-02 NOTE — Consult Note (Addendum)
 Cardiology Consultation   Patient ID: Elizabeth Mcintyre MRN: [id number]; DOB: August 11, 1941  Admit date: 05/01/2024 Date of Consult: 05/02/2024  PCP:  Elizabeth Suzzane POUR, MD   [facility] Providers Cardiologist:  None  - NEW    Patient Profile: Elizabeth Mcintyre is a 83 y.o. 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 Vicci.  History of Present Illness: Elizabeth Mcintyre was last seen in 2016 by Dr. Lavona for evaluation of dyspnea and an abnormal echo.  Reviewed recent echo 05/2015 with normal EF 60 to 65%, G1 DD, mild basilar septal hypertrophy, and no significant valvular abnormalities.  She reported improvement in breathing but still noted being winded with moderate activity just not as severe as previously.  EKG showed NSR with old anterior septal infarct.  No medication changes.  Continued on amlodipine  10 mg daily, losartan  100 mg daily, and Crestor  40 mg daily.  Ordered ETT 2016, which showed negative stress test and no evidence of obstructive CAD but hypertensive BP response.  Recommended monitoring blood pressure and continue current medication regimen.  She has been lost to follow-up since that time.  No other cardiology visits per Care Everywhere review.   Presents to [facility] ED on 8/17 for chest pain.  K3.3, Mg 1.6, CR 1.87 (07/2023: 1.19), AST 65, ALT 50, CBC WNL TN 6>7, D-dimer WNL EKG: NSR, HR 61 with Q wave (V1-3, III) and wandering baseline.  CXR with no acute findings. ED treated with 250 mL IV normal saline, ASA 81 mg, 10 mg an IV potassium chloride , 40 mg of IV Protonix , and 2 mg of IV morphine   sulfate.   On interview, patient reports intermittent chest pain X 1 m/o associated with rest and exertion described as numbness/pressure/tightness, located in midsternum,  lasting for hours, does not radiate, severity 5/10, exacerbated by cough. She came into ED because SOB worsened and she could not breath. Not associated with position changes.  Tried Tylenol  but no relief.  Associated symptoms include SOB, diaphoresis. Denied n/v, palpitations, dizziness, syncope, orthopnea, PND. No recent trauma, fever, or heavy lifting. Currently, CP is still present but not as severe. On exam, CP was reproducible with palpations.  Reports doing daily walking around the home and yard but not very active be on the home.  She lives at home with her son. She denies daily medication compliance and is not sure what she takes since daughter manages medications.  Denies tobacco use, EtOH or drugs. No known family history of CVD.   Past Medical History:  Diagnosis Date   Acquired hypothyroidism 05/30/2014   Acute cystitis without hematuria 10/09/2022   Acute pain of right shoulder 04/16/2022   Asthma    Bloating 04/26/2019   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   Cancer 2021   Lymphoma   Carotid artery stenosis 06/13/2016   Chronic SI joint pain    was on tramadol    Diverticulosis 03/2011   Dysphagia 04/18/2020   Essential hypertension, benign 11/09/2007   Current med losartan  100 mg qd, amlodopine 10 mg qd   Fall 07/22/2021   Fecal incontinence 07/07/2019   Gastroesophageal reflux disease with esophagitis 03/06/2015   Gout 06/09/2014  R great toe, ball of foot Stopped HCTZ several mos ago,  On losartan         Hematemesis 03/30/2020   Hepatic steatosis 01/04/2020   Herpes zoster without complication 12/02/2022   History of rheumatoid arthritis    during 30's, was treated.   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    Stress incontinence    Thrombocytopenia 01/04/2020   Type II diabetes mellitus 11/09/2007   diet control   Vitamin D  deficiency 05/25/2015    Past Surgical History:  Procedure Laterality Date   ABDOMINAL HYSTERECTOMY     BALLOON DILATION N/A 08/21/2020   Procedure: BALLOON DILATION;  Surgeon: Elizabeth Carlin POUR, DO;  Location: [facility];  Service: Endoscopy;  Laterality: N/A;   BIOPSY  03/30/2020   Procedure: BIOPSY;  Surgeon: Elizabeth Charleston, MD;  Location: [facility];  Service: Endoscopy;;   BIOPSY  05/02/2020   Procedure: BIOPSY;  Surgeon: Elizabeth Mcintyre, Toribio, MD;  Location: [facility];  Service: Gastroenterology;;   CARDIAC CATHETERIZATION     X 2, last one in 1998   CHOLECYSTECTOMY N/A 04/13/2017   Procedure: LAPAROSCOPIC CHOLECYSTECTOMY;  Surgeon: Elizabeth Anes, MD;  Location: [facility];  Service: General;  Laterality: N/A;   COLONOSCOPY     COLONOSCOPY  May 2012   Dr. Obie: mild diverticulosis, otherwise normal.    COLONOSCOPY WITH PROPOFOL  N/A 05/02/2020   Elizabeth Mcintyre: 8 mm polyp removed from the ascending colon, 2 mm polyp removed from the ascending colon.  Tubular adenomas.  Diverticulosis.  Mucosal ulceration in the sigmoid colon noted, pathology consistent with ischemic colitis.   ESOPHAGOGASTRODUODENOSCOPY N/A 01/28/2015   Dr. Shaaron:  reflux esophagitis, Schatzki's ring not manipulated due to recent bleeding   ESOPHAGOGASTRODUODENOSCOPY N/A 03/30/2015   Dr. Shaaron: Schatzki's ring s/p Elizabeth Mcintyre dilation, previously noted esophageal ulcer completely healed   ESOPHAGOGASTRODUODENOSCOPY N/A 03/30/2020   Buccini: Moderately severe erosive, circumferential, confluent esophagitis with no bleeding found 25 to 40 cm from incisors.  Nonobstructing and mild Schatzki ring, there were also multiple distal esophageal rings noted, minimal hiatal hernia.   ESOPHAGOGASTRODUODENOSCOPY (EGD) WITH PROPOFOL  N/A 08/21/2020   Procedure: ESOPHAGOGASTRODUODENOSCOPY (EGD) WITH PROPOFOL ;  Surgeon: Elizabeth Carlin POUR, DO;  [facility];  Service: Endoscopy;  Laterality: N/A;  2:15pm   IR IMAGING GUIDED PORT INSERTION  03/13/2020   IR REMOVAL TUN ACCESS W/ PORT W/O FL MOD SED  10/01/2021   LYMPH NODE BIOPSY Left 03/20/2020   Procedure: LEFT POSTERIOR CERVICAL LYMPH NODE BIOPSY;  Surgeon: Sebastian Moles, MD;  Location: [facility] OR;  Service: General;  Laterality: Left;   MALONEY DILATION N/A 03/30/2015   Procedure: Elizabeth Mcintyre HODGKIN;  Surgeon: Mcintyre CHRISTELLA Shaaron, MD;  Location[facility];  Service: Endoscopy;  Laterality: N/A;   POLYPECTOMY  05/02/2020   Procedure: POLYPECTOMY;  Surgeon: Elizabeth Angelia Sieving, MD;  Location: [facility];  Service: Gastroenterology;;     Home Medications:  Prior to Admission medications   Medication Sig Start Date End Date Taking? Authorizing Provider  acetaminophen  (TYLENOL ) 325 MG tablet Take 325 mg by mouth every 6 (six) hours as needed for moderate pain.     [provider]  albuterol  (VENTOLIN  HFA) 108 (90 Base) MCG/ACT inhaler Inhale 2 puffs into the lungs every 6 (six) hours as needed. 05/08/23   [provider]  amLODipine  (NORVASC ) 10 MG tablet Take 1 tablet (10 mg total) by mouth daily. 09/01/23   Elizabeth Suzzane POUR, MD  Ascorbic Acid (VITAMIN C ) 100 MG tablet Take 100 mg by mouth daily.     [provider]  carvedilol  (COREG ) 3.125 MG tablet Take 1 tablet (3.125 mg total) by mouth 2 (two) times daily with a meal. 09/01/23   Elizabeth, Suzzane POUR, MD  cetirizine (ZYRTEC) 10 MG tablet Take 10 mg by mouth at bedtime. As needed    [provider]  Cholecalciferol  (VITAMIN D3) 25 MCG (1000 UT) CAPS Take 1,000 Units by mouth daily.     [provider]  donepezil  (ARICEPT ) 10 MG tablet TAKE 1 TABLET BY MOUTH DAILY 06/22/23   Wertman, [name], PA-C  gabapentin  (NEURONTIN ) 100 MG capsule Take 1 capsule (100 mg total) by mouth daily. 09/01/23   Elizabeth Suzzane POUR, MD  levothyroxine  (SYNTHROID ) 75 MCG tablet Take 75 mcg by mouth daily. 03/27/23   [provider]  mirtazapine  (REMERON ) 15 MG tablet TAKE 1 TABLET BY MOUTH EVERYDAY AT BEDTIME 09/28/23   Elizabeth Suzzane POUR, MD  pantoprazole  (PROTONIX ) 40 MG tablet Take 40 mg by mouth daily. 03/31/23   [provider]  rOPINIRole  (REQUIP ) 0.25 MG tablet Take 0.25 mg by mouth at bedtime. 01/30/23   [provider]  rosuvastatin  (CRESTOR ) 20 MG tablet Take 20 mg by mouth daily. 01/30/23   [provider]  sucralfate  (CARAFATE ) 1 g tablet TAKE 1 TABLET BY MOUTH 4 TIMES DAILY. 09/28/23   Elizabeth Suzzane POUR, MD  vitamin B-12 (CYANOCOBALAMIN ) 1000 MCG tablet Take 1,000 mcg by mouth daily.     [provider]    Scheduled Meds:  amLODipine   10 mg Oral Daily   aspirin  EC  81 mg Oral Daily   carvedilol   3.125 mg Oral BID WC   cyanocobalamin   1,000 mcg Oral Daily   donepezil   10 mg Oral Daily   enoxaparin  (LOVENOX ) injection  30 mg Subcutaneous Q24H   gabapentin   100 mg Oral Daily   levothyroxine   75 mcg Oral Daily   loratadine   10 mg Oral Daily   mirtazapine   15 mg Oral QHS   pantoprazole   40 mg Oral Daily   rOPINIRole   0.25 mg Oral QHS   rosuvastatin   20 mg Oral Daily   sucralfate   1 g Oral QID   vitamin C   100 mg Oral Daily   Vitamin D3  1,000 Units Oral Daily   Continuous Infusions:  sodium  chloride 100 mL/hr at 05/02/24 0024   PRN Meds: acetaminophen , albuterol , ALPRAZolam , magnesium  hydroxide, metoCLOPramide  (REGLAN ) injection  Allergies:    Allergies  Allergen Reactions   Codeine Nausea And Vomiting and Rash   Codeine Phosphate Nausea And Vomiting and Rash    Social History:   Social History   Socioeconomic History   Marital status: Widowed  Spouse name: Not on file   Number of children: 2   Years of education: 12   Highest education level: High school graduate  Occupational History   Occupation: Retired    Comment: Print production planner  Tobacco Use   Smoking status: Never   Smokeless tobacco: Never  Vaping Use   Vaping status: Never Used  Substance and Sexual Activity   Alcohol use: Never   Drug use: Never   Sexual activity: Never  Other Topics Concern   Not on file  Social History Narrative   ** Merged History Encounter **       Ms. Schul is widowed. Her young grandson lives with her, for whom she shares custody with her daughter, the son's aunt.   Right handed      Family History:   Family History  Problem Relation Age of Onset   Heart disease Mother    Osteoarthritis Mother    Sudden death Father    Single kidney Father    Other Father        h/o severe MVA injuries   Hyperlipidemia Sister    Other Daughter        Myalgias   Fibromyalgia Daughter    Allergies Daughter    Heart disease Maternal Grandfather    Sudden death Paternal Grandmother    Diabetes Paternal Grandfather    Heart disease Daughter    Other Daughter        palpitations   Pulmonary fibrosis Maternal Aunt    Cancer Paternal Uncle    Pulmonary fibrosis Maternal Aunt    Colon cancer Neg Hx      ROS:  Please see the history of present illness.  All other ROS reviewed and negative.     Physical Exam/Data: Vitals:   05/02/24 0400 05/02/24 0500 05/02/24 0545 05/02/24 0600  BP: (!) 140/74 (!) 144/65 (!) 152/66 (!) 151/52  Pulse: 61 62 70 61  Resp: 13 14 14 12    Temp:   97.7 F (36.5 C)   TempSrc:   Oral   SpO2: 96% 95% 97% 97%  Weight:      Height:        Intake/Output Summary (Last 24 hours) at 05/02/2024 0706 Last data filed at 05/01/2024 1851 Gross per 24 hour  Intake 350 ml  Output --  Net 350 ml      05/01/2024    4:11 PM 08/03/2023   11:19 AM 06/17/2023    3:11 PM  Last 3 Weights  Weight (lbs) 140 lb 143 lb 12.8 oz 138 lb  Weight (kg) 63.504 kg 65.227 kg 62.596 kg     Body mass index is 24.03 kg/m.  General:  Well nourished, well developed, in no acute distress HEENT: normal Neck: no JVD Vascular: No carotid bruits; Distal pulses 2+ bilaterally Cardiac:  normal S1, S2; RRR; no murmur; reproducible CP Lungs:  clear to auscultation bilaterally, no wheezing, rhonchi or rales  Abd: soft, nontender, no hepatomegaly  Ext: no edema Musculoskeletal:  No deformities, BUE and BLE strength normal and equal Skin: warm and dry  Neuro:  CNs 2-12 intact, no focal abnormalities noted Psych:  Normal affect   EKG:  The EKG was personally reviewed and demonstrates:  NSR, HR 61 with Q wave (V1-3, III) and wandering baseline  Telemetry:  Telemetry was personally reviewed and demonstrates:  NSR HR 60-80's  Relevant CV Studies: ECHO 2016 Study Conclusions   - Left ventricle: The cavity size was normal. There was focal  basal    hypertrophy. Systolic function was normal. The estimated ejection    fraction was in the range of 60% to 65%. Doppler parameters are    consistent with abnormal left ventricular relaxation (grade 1    diastolic dysfunction).   ETT 2016 Blood pressure demonstrated a hypertensive response to exercise. Undulating artifact during stress. No obvious ST segment deviation. Duke treadmill score portends a low risk for major adverse cardiac events. The patient exercised following the Bruce protocol.  The patient reported dizziness and shortness of breath during the stress test. The patient experienced no angina during the  stress test.   The test was stopped because the patient complained of fatigue and shortness of breath.   Heart rate demonstrated a normal response to exercise. Blood pressure demonstrated a hypertensive response to exercise. Overall, the patient's exercise capacity was normal.   85% of maximum heart rate was achieved after 1.3 minutes. Recovery time: 7 minutes.  Duke Treadmill Score: low risk The patient's response to exercise was adequate for diagnosis   Laboratory Data: High Sensitivity Troponin:   Recent Labs  Lab 05/01/24 1630 05/01/24 1829  TROPONINIHS 6 7     Chemistry Recent Labs  Lab 05/01/24 1630 05/01/24 1906  NA 129*  --   K 3.3*  --   CL 96*  --   CO2 19*  --   GLUCOSE 182*  --   BUN 10  --   CREATININE 1.87*  --   CALCIUM  8.9  --   MG  --  1.6*  GFRNONAA 27*  --   ANIONGAP 14  --     Recent Labs  Lab 05/01/24 1700  PROT 7.0  ALBUMIN 4.0  AST 65*  ALT 50*  ALKPHOS 65  BILITOT 1.8*   Lipids No results for input(s): CHOL, TRIG, HDL, LABVLDL, LDLCALC, CHOLHDL in the last 168 hours.  Hematology Recent Labs  Lab 05/01/24 1747  WBC 7.6  RBC 4.81  HGB 14.5  HCT 40.7  MCV 84.6  MCH 30.1  MCHC 35.6  RDW 13.0  PLT 209   Thyroid  No results for input(s): TSH, FREET4 in the last 168 hours.  BNPNo results for input(s): BNP, PROBNP in the last 168 hours.  DDimer  Recent Labs  Lab 05/01/24 1747  DDIMER 0.49    Radiology/Studies:  DG Chest 2 View Result Date: 05/01/2024 CLINICAL DATA:  Central chest tightness for 2 days, back and neck pain EXAM: CHEST - 2 VIEW COMPARISON:  05/17/2023 FINDINGS: Frontal and lateral views of the chest demonstrate a stable cardiac silhouette. Atherosclerosis of the thoracic aorta again noted. No airspace disease, effusion, or pneumothorax. No acute bony abnormalities. IMPRESSION: 1. Stable chest, no acute process. Electronically Signed   By: Ozell Daring M.D.   On: 05/01/2024 17:14     Assessment  and Plan: Atypical Chest pain ECHO 2016: normal EF 60 to 65%, G1 DD, mild basilar septal hypertrophy, and no significant valvular abnormalities. Order ECHO.  ETT 2016, which showed negative stress test and no evidence of obstructive CAD but hypertensive BP response. Reports intermittent atypical CP X 1 month lasting for hours, 5/10,  numbness/pressure/tightness as above associated with SOB, diaphoresis and cough.  Currently CP is still present but not as severe.  On exam CP was reproducible palpitations. TN 6>7 EKG: NSR, HR 61 with Q wave (V1-3, III) and wandering baseline. Order EKG.  Treated with ASA 81 mg and IV morphine . Would not consider for cardiac cath at this  time based on presentation, negative TN and no acute EKG changes.  Can reconsider based on pending results. Discussed stress test.  Patient agrees with Lexiscan . Ordered n.p.o. order in case stress test added today.  Will discuss with MD.   SOB  Echo 2016 as above.  Order echo.  D-dimer negative Reports SOB associated with CP as above.  Denies edema, orthopnea, PND.  Currently no SOB. Does not appear volume overloaded on exam.    AKI on CKD IIIa CR 1.87 (07/2023: 1.19) Continue to monitor  Hypokalemia/hypomagnesemia K3.3, Mg 1.6 Treated with potassium supplement. Ordered to start magnesium  supplement Continue to monitor  HTN BP elevated 150-160's/50-70's with most recent 159/74. Scheduled to start amlodipine  10 mg and Coreg  3.125 mg twice daily Hold on Coreg  with HR in 60's. Will d/c for now. Can consider losartan  if additional BP needed and HR still borderline normal.  Can reassess after medications started.   HLD 07/2023 LDL 119 Continue on Crestor  20 mg daily.  Informed Consent   Shared Decision Making/Informed Consent The risks [chest pain, shortness of breath, cardiac arrhythmias, dizziness, blood pressure fluctuations, myocardial infarction, stroke/transient ischemic attack, nausea, vomiting, allergic  reaction, radiation exposure, metallic taste sensation and life-threatening complications (estimated to be 1 in 10,000)], benefits (risk stratification, diagnosing coronary artery disease, treatment guidance) and alternatives of a nuclear stress test were discussed in detail with Ms. Glassberg and she agrees to proceed.       For questions or updates, please contact [facility] Please consult [email or web address] for contact info under    Signed, Lorette CINDERELLA Kapur, PA-C  05/02/2024 7:06 AM

## 2024-05-02 NOTE — Discharge Instructions (Signed)
IMPORTANT INFORMATION: PAY CLOSE ATTENTION   PHYSICIAN DISCHARGE INSTRUCTIONS  Follow with Primary care provider  Patel, Rutwik K, MD  and other consultants as instructed by your Hospitalist Physician  SEEK MEDICAL CARE OR RETURN TO EMERGENCY ROOM IF SYMPTOMS COME BACK, WORSEN OR NEW PROBLEM DEVELOPS   Please note: You were cared for by a hospitalist during your hospital stay. Every effort will be made to forward records to your primary care provider.  You can request that your primary care provider send for your hospital records if they have not received them.  Once you are discharged, your primary care physician will handle any further medical issues. Please note that NO REFILLS for any discharge medications will be authorized once you are discharged, as it is imperative that you return to your primary care physician (or establish a relationship with a primary care physician if you do not have one) for your post hospital discharge needs so that they can reassess your need for medications and monitor your lab values.  Please get a complete blood count and chemistry panel checked by your Primary MD at your next visit, and again as instructed by your Primary MD.  Get Medicines reviewed and adjusted: Please take all your medications with you for your next visit with your Primary MD  Laboratory/radiological data: Please request your Primary MD to go over all hospital tests and procedure/radiological results at the follow up, please ask your primary care provider to get all Hospital records sent to his/her office.  In some cases, they will be blood work, cultures and biopsy results pending at the time of your discharge. Please request that your primary care provider follow up on these results.  If you are diabetic, please bring your blood sugar readings with you to your follow up appointment with primary care.    Please call and make your follow up appointments as soon as possible.    Also Note  the following: If you experience worsening of your admission symptoms, develop shortness of breath, life threatening emergency, suicidal or homicidal thoughts you must seek medical attention immediately by calling 911 or calling your MD immediately  if symptoms less severe.  You must read complete instructions/literature along with all the possible adverse reactions/side effects for all the Medicines you take and that have been prescribed to you. Take any new Medicines after you have completely understood and accpet all the possible adverse reactions/side effects.   Do not drive when taking Pain medications or sleeping medications (Benzodiazepines)  Do not take more than prescribed Pain, Sleep and Anxiety Medications. It is not advisable to combine anxiety,sleep and pain medications without talking with your primary care practitioner  Special Instructions: If you have smoked or chewed Tobacco  in the last 2 yrs please stop smoking, stop any regular Alcohol  and or any Recreational drug use.  Wear Seat belts while driving.  Do not drive if taking any narcotic, mind altering or controlled substances or recreational drugs or alcohol.       

## 2024-05-02 NOTE — ED Notes (Signed)
 Pt was found sitting on the edge of the bed. Sts she forgot where she was at. Pt was reoriented. Informed she was at [facility] here for CP. Pt replied with that right. Was repositioned into bed. Placed on posey sitter alarm. Call bell within reach.

## 2024-05-02 NOTE — Progress Notes (Signed)
 PROGRESS NOTE   Elizabeth Mcintyre  FMW:[id number] DOB: 06-Oct-1940 DOA: 05/01/2024 PCP: Elizabeth Mcintyre   Chief Complaint  Patient presents with   Chest Pain   Level of care: Telemetry  Brief Admission History:  83 y.o. Caucasian female with medical history significant for asthma, essential hypertension, hypothyroidism, dementia, vitamin B12 deficiency, carotid artery stenosis, diverticulosis, GERD, gout, IBS and dyslipidemia, who presented to the emergency room with acute onset of midsternal chest pain felt chest tightness and graded 8/10 in severity with associated dyspnea which has been intermittent over the last week.  She has been having diaphoresis.  No nausea or vomiting or abdominal pain.  No cough or wheezing or hemoptysis.  No leg pain or edema or recent travels or surgeries.  No fever or chills.  No dysuria, oliguria or hematuria or flank pain.  No bleeding diathesis.   ED Course: When she came to the ER, BP was 162/87 with otherwise normal vital signs.  Labs revealed hyponatremia 129 hypokalemia 96 with hypokalemia of 3.3 and CO2 of 19 and a blood glucose of 182 with creatinine 1.87 above 1.19 on 08/03/2023 with a BUN of 10. EKG as reviewed by me : EKG showed sinus rhythm with rate of 91 with Q waves anteroseptally and inferiorly. Imaging: 2 view chest x-ray showed no acute cardiopulmonary disease.   The patient was given 250 mL IV normal saline, 10 mg an IV potassium chloride , 40 mg of IV Protonix , and 2 mg of IV morphine  sulfate.  She will be admitted to an observation telemetry bed for further evaluation and management.   Assessment and Plan:  Chest pain - The patient was admitted to an observation cardiac telemetry bed. - Will follow serial troponins and EKGs. - The patient will be placed on aspirin  as well as p.r.n. sublingual nitroglycerin  and morphine  sulfate for pain. - cardiology consulted and planning for nuclear stress test/echocardiogram today  Hyponatremia -  resolved  - The patient will be hydrated with IV normal saline as mentioned above with added potassium chloride . - This was hypovolemic hyponatremia  Acute kidney injury superimposed on chronic kidney disease - This is AKI superimposed on stage IIIa chronic kidney disease. - The patient will be hydrated with IV normal saline with added potassium chloride . - Will follow BMP. - Will avoid nephrotoxins.  Peripheral neuropathy - We will continue Neurontin .  Hypothyroidism - Will continue Synthroid .  GERD without esophagitis - Will continue PPI therapy and Carafate .  Hypokalemia - repleted - repleted   Restless legs syndrome - Will continue Requip .  Essential hypertension - Will continue antihypertensive therapy.  Dyslipidemia - Will continue statin therapy.  DVT prophylaxis: enoxaparin  Code Status: Full  Family Communication:  Disposition: home    Consultants:   Procedures:   Antimicrobials:    Subjective: Pt says her chest pain is starting to feel better.    Objective: Vitals:   05/02/24 0600 05/02/24 0800 05/02/24 0945 05/02/24 1357  BP: (!) 151/52 (!) 143/71 (!) 148/85 128/63  Pulse: 61 63 (!) 58 77  Resp: 12 12  18   Temp:   97.6 F (36.4 C) 97.8 F (36.6 C)  TempSrc:   Oral   SpO2: 97% 100% 98% 99%  Weight:   62.9 kg   Height:   5' 1 (1.549 m)     Intake/Output Summary (Last 24 hours) at 05/02/2024 1424 Last data filed at 05/02/2024 1300 Gross per 24 hour  Intake 350 ml  Output --  Net 350 ml  Filed Weights   05/01/24 1611 05/02/24 0945  Weight: 63.5 kg 62.9 kg   Examination:  General exam: Appears calm and comfortable  Respiratory system: Clear to auscultation. Respiratory effort normal. Cardiovascular system: normal S1 & S2 heard. No JVD, murmurs, rubs, gallops or clicks. No pedal edema. Gastrointestinal system: Abdomen is nondistended, soft and nontender. No organomegaly or masses felt. Normal bowel sounds heard. Central nervous system:  Alert and oriented. No focal neurological deficits. Extremities: Symmetric 5 x 5 power. Skin: No rashes, lesions or ulcers. Psychiatry: Judgement and insight appear normal. Mood & affect appropriate.   Data Reviewed: I have personally reviewed following labs and imaging studies  CBC: Recent Labs  Lab 05/01/24 1747  WBC 7.6  HGB 14.5  HCT 40.7  MCV 84.6  PLT 209    Basic Metabolic Panel: Recent Labs  Lab 05/01/24 1630 05/01/24 1906 05/02/24 0823  NA 129*  --  139  K 3.3*  --  4.2  CL 96*  --  106  CO2 19*  --  24  GLUCOSE 182*  --  128*  BUN 10  --  10  CREATININE 1.87*  --  1.50*  CALCIUM  8.9  --  8.8*  MG  --  1.6*  --     CBG: No results for input(s): GLUCAP in the last 168 hours.  No results found for this or any previous visit (from the past 240 hours).   Radiology Studies: DG Chest 2 View Result Date: 05/01/2024 CLINICAL DATA:  Central chest tightness for 2 days, back and neck pain EXAM: CHEST - 2 VIEW COMPARISON:  05/17/2023 FINDINGS: Frontal and lateral views of the chest demonstrate a stable cardiac silhouette. Atherosclerosis of the thoracic aorta again noted. No airspace disease, effusion, or pneumothorax. No acute bony abnormalities. IMPRESSION: 1. Stable chest, no acute process. Electronically Signed   By: Ozell Daring M.D.   On: 05/01/2024 17:14    Scheduled Meds:  amLODipine   10 mg Oral Daily   aspirin  EC  81 mg Oral Daily   cyanocobalamin   1,000 mcg Oral Daily   donepezil   10 mg Oral Daily   enoxaparin  (LOVENOX ) injection  30 mg Subcutaneous Q24H   gabapentin   100 mg Oral Daily   levothyroxine   75 mcg Oral Daily   loratadine   10 mg Oral Daily   mirtazapine   15 mg Oral QHS   pantoprazole   40 mg Oral Daily   rOPINIRole   0.25 mg Oral QHS   rosuvastatin   20 mg Oral Daily   sucralfate   1 g Oral QID   vitamin C   100 mg Oral Daily   Vitamin D3  1,000 Units Oral Daily   Continuous Infusions:  lactated ringers        LOS: 0 days   Time  spent: 46 mins  Elizabeth Acord Vicci, Mcintyre How to contact the [facility] Attending or Consulting provider 7A - 7P or covering provider during after hours 7P -7A, for this patient?  Check the care team in [facility] and look for a) attending/consulting [facility] provider listed and b) the [facility] team listed Log into [email or web address] to find provider on call.  Locate the [facility] provider you are looking for under [facility] and page to a number that you can be directly reached. If you still have difficulty reaching the provider, please page the [facility] (Director on Call) for the Hospitalists listed on amion for assistance.  05/02/2024, 2:24 PM

## 2024-05-03 ENCOUNTER — Telehealth: Payer: Self-pay

## 2024-05-03 NOTE — Transitions of Care (Post Inpatient/ED Visit) (Signed)
 05/03/2024  Name: Elizabeth Mcintyre MRN: [id number] DOB: 1941-03-11  Today's TOC FU Call Status: Today's TOC FU Call Status:: Successful TOC FU Call Completed TOC FU Call Complete Date: 05/03/24 Patient's Name and Date of Birth confirmed.  Transition Care Management Follow-up Telephone Call Date of Discharge: 05/02/24 [facility] Type of Discharge: Inpatient Admission Primary Inpatient Discharge Diagnosis:: chest pain How have you been since you were released from the hospital?: Better Any questions or concerns?: No  Items Reviewed: Did you receive and understand the discharge instructions provided?: Yes Medications obtained,verified, and reconciled?: Yes (Medications Reviewed) Any new allergies since your discharge?: No Dietary orders reviewed?: Yes Do you have support at home?: Yes People in Home [RPT]: child(ren), adult  Medications Reviewed Today: Medications Reviewed Today     Reviewed by Emmitt Pan, LPN (Licensed Practical Nurse) on 05/03/24 at 1501  Med List Status: <None>   Medication Order Taking? Sig Documenting Provider Last Dose Status Informant  acetaminophen  (TYLENOL ) 325 MG tablet [id number] Yes Take 325 mg by mouth every 6 (six) hours as needed for moderate pain.  [provider]  Active Self  albuterol  (VENTOLIN  HFA) 108 (90 Base) MCG/ACT inhaler [id number] Yes Inhale 2 puffs into the lungs every 6 (six) hours as needed. [provider]  Active Self, Pharmacy Records  amLODipine  (NORVASC ) 10 MG tablet [id number] Yes Take 1 tablet (10 mg total) by mouth daily. [name], MD  Active   Ascorbic Acid (VITAMIN C ) 100 MG tablet [id number] Yes Take 100 mg by mouth daily. [provider]  Active Self  carvedilol  (COREG ) 3.125 MG tablet [id number] Yes Take 1 tablet (3.125 mg total) by mouth 2 (two) times daily with a meal. [name], MD  Active   cetirizine (ZYRTEC) 10 MG tablet [id number] Yes Take 10 mg by  mouth at bedtime. As needed [provider]  Active   Cholecalciferol  (VITAMIN D3) 25 MCG (1000 UT) CAPS [id number] Yes Take 1,000 Units by mouth daily.  [provider]  Active Self  donepezil  (ARICEPT ) 10 MG tablet [id number] Yes TAKE 1 TABLET BY MOUTH DAILY Wertman, [name], PA-C  Active   gabapentin  (NEURONTIN ) 100 MG capsule [id number] Yes Take 1 capsule (100 mg total) by mouth daily. [name], MD  Active   levothyroxine  (SYNTHROID ) 75 MCG tablet [id number] Yes Take 75 mcg by mouth daily. [provider]  Active Self, Pharmacy Records  mirtazapine  (REMERON ) 15 MG tablet [id number] Yes TAKE 1 TABLET BY MOUTH EVERYDAY AT BEDTIME Patel, Rutwik K, MD  Active   pantoprazole  (PROTONIX ) 40 MG tablet [id number] Yes Take 40 mg by mouth daily. [provider]  Active Self, Pharmacy Records  rOPINIRole  (REQUIP ) 0.25 MG tablet [id number] Yes Take 0.25 mg by mouth at bedtime. [provider]  Active Self, Pharmacy Records  rosuvastatin  (CRESTOR ) 20 MG tablet [id number] Yes Take 20 mg by mouth daily. [provider]  Active Self, Pharmacy Records  sucralfate  (CARAFATE ) 1 g tablet [id number] Yes TAKE 1 TABLET BY MOUTH 4 TIMES DAILY. [name], MD  Active   vitamin B-12 (CYANOCOBALAMIN ) 1000 MCG tablet [id number] Yes Take 1,000 mcg by mouth daily.  [provider]  Active Self            Home Care and Equipment/Supplies: Were Home Health Services Ordered?: Yes Name of Home Health Agency:: unknown Has Agency set up a time to come to your home?: Yes First Home Health Visit Date: 05/04/24 Any  new equipment or medical supplies ordered?: NA  Functional Questionnaire: Do you need assistance with bathing/showering or dressing?: No Do you need assistance with meal preparation?: No Do you need assistance with eating?: No Do you have difficulty maintaining continence: No Do you need assistance with getting out of bed/getting out of a  chair/moving?: No Do you have difficulty managing or taking your medications?: No  Follow up appointments reviewed: PCP Follow-up appointment confirmed?: Yes Date of PCP follow-up appointment?: 05/10/24 Follow-up Provider: [facility] Follow-up appointment confirmed?: NA Do you need transportation to your follow-up appointment?: No Do you understand care options if your condition(s) worsen?: Yes-patient verbalized understanding    SIGNATURE Julian Lemmings, LPN [facility] Nurse Health Advisor Direct Dial [phone]

## 2024-05-04 ENCOUNTER — Telehealth: Payer: Self-pay | Admitting: *Deleted

## 2024-05-04 DIAGNOSIS — R0602 Shortness of breath: Secondary | ICD-10-CM

## 2024-05-04 NOTE — Telephone Encounter (Signed)
 Patient informed and agrees to having the echocardiogram. Verbalized understanding of plan.

## 2024-05-04 NOTE — Telephone Encounter (Signed)
-----   Message from Lorette CINDERELLA Kapur sent at 05/04/2024  9:42 AM EDT ----- Regarding: ECHO This patient was recently in hospital with pending ECHO but was discharged before ECHO was completed. Per Dr. Mallipeddi, please order and schedule outpatient ECHO (Dx: SOB) if patient agrees.   Thank you.  Signed,  Lorette CINDERELLA Kapur, PA-C 05/04/2024, 9:44 AM

## 2024-05-06 ENCOUNTER — Telehealth: Payer: Self-pay | Admitting: Internal Medicine

## 2024-05-06 ENCOUNTER — Encounter: Payer: Self-pay | Admitting: Radiology

## 2024-05-06 NOTE — Telephone Encounter (Signed)
 Checking percert on the following patient for testing scheduled at [facility].   ECHO - 05-25-24

## 2024-05-10 ENCOUNTER — Encounter: Payer: Self-pay | Admitting: Internal Medicine

## 2024-05-10 ENCOUNTER — Ambulatory Visit: Admitting: Internal Medicine

## 2024-05-10 ENCOUNTER — Inpatient Hospital Stay: Admitting: Nurse Practitioner

## 2024-05-10 VITALS — BP 144/76 | HR 89 | Ht 62.0 in | Wt 141.0 lb

## 2024-05-10 DIAGNOSIS — K2100 Gastro-esophageal reflux disease with esophagitis, without bleeding: Secondary | ICD-10-CM | POA: Diagnosis not present

## 2024-05-10 DIAGNOSIS — I10 Essential (primary) hypertension: Secondary | ICD-10-CM | POA: Diagnosis not present

## 2024-05-10 DIAGNOSIS — Z09 Encounter for follow-up examination after completed treatment for conditions other than malignant neoplasm: Secondary | ICD-10-CM | POA: Diagnosis not present

## 2024-05-10 DIAGNOSIS — F339 Major depressive disorder, recurrent, unspecified: Secondary | ICD-10-CM

## 2024-05-10 DIAGNOSIS — R079 Chest pain, unspecified: Secondary | ICD-10-CM

## 2024-05-10 DIAGNOSIS — G309 Alzheimer's disease, unspecified: Secondary | ICD-10-CM

## 2024-05-10 DIAGNOSIS — R0789 Other chest pain: Secondary | ICD-10-CM

## 2024-05-10 DIAGNOSIS — C8580 Other specified types of non-Hodgkin lymphoma, unspecified site: Secondary | ICD-10-CM

## 2024-05-10 DIAGNOSIS — E039 Hypothyroidism, unspecified: Secondary | ICD-10-CM

## 2024-05-10 DIAGNOSIS — E782 Mixed hyperlipidemia: Secondary | ICD-10-CM

## 2024-05-10 DIAGNOSIS — N1831 Chronic kidney disease, stage 3a: Secondary | ICD-10-CM | POA: Diagnosis not present

## 2024-05-10 DIAGNOSIS — F02B18 Dementia in other diseases classified elsewhere, moderate, with other behavioral disturbance: Secondary | ICD-10-CM | POA: Diagnosis not present

## 2024-05-10 MED ORDER — MIRTAZAPINE 15 MG PO TABS: 15.0000 mg | ORAL_TABLET | Freq: Every day | ORAL | 2 refills | Status: AC

## 2024-05-10 MED ORDER — DONEPEZIL HCL 10 MG PO TABS: 10.0000 mg | ORAL_TABLET | Freq: Every day | ORAL | 1 refills | Status: AC

## 2024-05-10 MED ORDER — ROSUVASTATIN CALCIUM 10 MG PO TABS: 10.0000 mg | ORAL_TABLET | Freq: Every day | ORAL | 1 refills | Status: AC

## 2024-05-10 MED ORDER — PANTOPRAZOLE SODIUM 40 MG PO TBEC: 40.0000 mg | DELAYED_RELEASE_TABLET | Freq: Every day | ORAL | 1 refills | Status: DC

## 2024-05-10 MED ORDER — CARVEDILOL 3.125 MG PO TABS: 3.1250 mg | ORAL_TABLET | Freq: Two times a day (BID) | ORAL | 3 refills | Status: AC

## 2024-05-10 MED ORDER — LEVOTHYROXINE SODIUM 75 MCG PO TABS: 75.0000 ug | ORAL_TABLET | Freq: Every day | ORAL | 1 refills | Status: DC

## 2024-05-10 MED ORDER — AMLODIPINE BESYLATE 10 MG PO TABS: 10.0000 mg | ORAL_TABLET | Freq: Every day | ORAL | 1 refills | Status: AC

## 2024-05-10 NOTE — Assessment & Plan Note (Signed)
 Hospital chart reviewed, including discharge summary Medications reconciled and reviewed with the patient in detail

## 2024-05-10 NOTE — Assessment & Plan Note (Signed)
 Used to take donepezil  10 mg QD Has been noncompliant Her son reports worsening of her memory deficits, but he is going to help her arrange her medications Restart Remeron  15 mg nightly, can help with insomnia and improving appetite

## 2024-05-10 NOTE — Assessment & Plan Note (Signed)
 Checked CMP, GFR stays around 45 - needs to improve hydration Avoid nephrotoxic agents

## 2024-05-10 NOTE — Assessment & Plan Note (Deleted)
 Her atypical chest pain is likely due to GERD Needs to continue pantoprazole  40 mg QD regularly Avoid hot and spicy food

## 2024-05-10 NOTE — Assessment & Plan Note (Signed)
 BP Readings from Last 1 Encounters:  05/10/24 (!) 144/76    Uncontrolled due to noncompliance Restart Amlodipine  10 mg QD and Coreg  3.125 mg BID (considering tachycardia) Counseled for compliance with the medications Advised DASH diet

## 2024-05-10 NOTE — Progress Notes (Signed)
 Established Patient Office Visit  Subjective:  Patient ID: Elizabeth Mcintyre, female    DOB: 1941/07/09  Age: 83 y.o. MRN: [id number]  CC:  Chief Complaint  Patient presents with   Follow-up    Hospital f/u     HPI Elizabeth Mcintyre is a 83 y.o. 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  She presented to the emergency room with acute onset of midsternal chest pain felt chest tightness and graded 8/10 in severity with associated dyspnea which has been intermittent over the last week. She has been having diaphoresis. The patient was admitted to an observation cardiac telemetry bed. Serial troponins and EKGs reassuring - ACS ruled out.  Cardiology consulted and had nuclear stress test/echocardiogram-- this was a normal nuclear study with no findings of ischemia or infarction, low risk study, LV perfusion is normal. There is no evidence of ischemia. There is no evidence of infarction. Left ventricular function is normal. Nuclear stress EF: 97%.  She had AKI on CKD, which improved with IV fluids.  Of note, she admits that she has not been taking her medications regularly.  She has not refilled her medicines since 12/24 and states that she still has medications at home.  After lengthy discussion with her and her son, she agrees to restart taking medications as prescribed regularly.  Past Medical History:  Diagnosis Date   Acquired hypothyroidism 05/30/2014   Acute cystitis without hematuria 10/09/2022   Acute pain of right shoulder 04/16/2022   Asthma    Bloating 04/26/2019   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   Cancer 2021   Lymphoma   Carotid artery stenosis 06/13/2016   Chronic SI joint pain    was on tramadol    Diverticulosis 03/2011   Dysphagia 04/18/2020    Essential hypertension, benign 11/09/2007   Current med losartan  100 mg qd, amlodopine 10 mg qd   Fall 07/22/2021   Fecal incontinence 07/07/2019   Gastroesophageal reflux disease with esophagitis 03/06/2015   Gout 06/09/2014   R great toe, ball of foot Stopped HCTZ several mos ago,  On losartan         Hematemesis 03/30/2020   Hepatic steatosis 01/04/2020   Herpes zoster without complication 12/02/2022   History of rheumatoid arthritis    during 30's, was treated.   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    Stress incontinence    Thrombocytopenia 01/04/2020   Type II diabetes mellitus 11/09/2007   diet control   Vitamin D  deficiency 05/25/2015    Past Surgical History:  Procedure Laterality Date   ABDOMINAL HYSTERECTOMY  BALLOON DILATION N/A 08/21/2020   Procedure: BALLOON DILATION;  Surgeon: Cindie Carlin POUR, DO;  Location: [facility];  Service: Endoscopy;  Laterality: N/A;   BIOPSY  03/30/2020   Procedure: BIOPSY;  Surgeon: Donnald Charleston, MD;  Location: [facility];  Service: Endoscopy;;   BIOPSY  05/02/2020   Procedure: BIOPSY;  Surgeon: Eartha Flavors, Toribio, MD;  Location: [facility];  Service: Gastroenterology;;   CARDIAC CATHETERIZATION      X 2, last one in 1998   CHOLECYSTECTOMY N/A 04/13/2017   Procedure: LAPAROSCOPIC CHOLECYSTECTOMY;  Surgeon: Mavis Anes, MD;  Location: [facility];  Service: General;  Laterality: N/A;   COLONOSCOPY     COLONOSCOPY  May 2012   Dr. Obie: mild diverticulosis, otherwise normal.    COLONOSCOPY WITH PROPOFOL  N/A 05/02/2020   Dr. Eartha: 8 mm polyp removed from the ascending colon, 2 mm polyp removed from the ascending colon.  Tubular adenomas.  Diverticulosis.  Mucosal ulceration in the sigmoid colon noted, pathology consistent with ischemic colitis.   ESOPHAGOGASTRODUODENOSCOPY N/A 01/28/2015   Dr. Shaaron: reflux esophagitis, Schatzki's ring not manipulated due to recent bleeding   ESOPHAGOGASTRODUODENOSCOPY N/A 03/30/2015   Dr. Shaaron: Schatzki's ring s/p Agapito dilation, previously noted esophageal ulcer completely healed   ESOPHAGOGASTRODUODENOSCOPY N/A 03/30/2020   Buccini: Moderately severe erosive, circumferential, confluent esophagitis with no bleeding found 25 to 40 cm from incisors.  Nonobstructing and mild Schatzki ring, there were also multiple distal esophageal rings noted, minimal hiatal hernia.   ESOPHAGOGASTRODUODENOSCOPY (EGD) WITH PROPOFOL  N/A 08/21/2020   Procedure: ESOPHAGOGASTRODUODENOSCOPY (EGD) WITH PROPOFOL ;  Surgeon: Cindie Carlin POUR, DO;  [facility];  Service: Endoscopy;  Laterality: N/A;  2:15pm   IR IMAGING GUIDED PORT INSERTION  03/13/2020   IR REMOVAL TUN ACCESS W/ PORT W/O FL MOD SED  10/01/2021   LYMPH NODE BIOPSY Left 03/20/2020   Procedure: LEFT POSTERIOR CERVICAL LYMPH NODE BIOPSY;  Surgeon: Sebastian Moles, MD;  [facility];  Service: General;  Laterality: Left;   MALONEY DILATION N/A 03/30/2015   Procedure: AGAPITO HODGKIN;  Surgeon: Charleston CHRISTELLA Shaaron, MD;  [facility];  Service: Endoscopy;  Laterality: N/A;   POLYPECTOMY  05/02/2020   Procedure: POLYPECTOMY;  Surgeon: Eartha Flavors, Toribio, MD;  [facility];  Service:  Gastroenterology;;    Family History  Problem Relation Age of Onset   Heart disease Mother    Osteoarthritis Mother    Sudden death Father    Single kidney Father    Other Father        h/o severe MVA injuries   Hyperlipidemia Sister    Other Daughter        Myalgias   Fibromyalgia Daughter    Allergies Daughter    Heart disease Maternal Grandfather    Sudden death Paternal Grandmother    Diabetes Paternal Grandfather    Heart disease Daughter    Other Daughter        palpitations   Pulmonary fibrosis Maternal Aunt    Cancer Paternal Uncle    Pulmonary fibrosis Maternal Aunt    Colon cancer Neg Hx     Social History   Socioeconomic History   Marital status: Widowed    Spouse name: Not on file   Number of children: 2   Years of education: 12   Highest education level: High school graduate  Occupational History   Occupation: Retired    Comment: Print production planner  Tobacco Use   Smoking status: Never  Smokeless tobacco: Never  Vaping Use   Vaping status: Never Used  Substance and Sexual Activity   Alcohol use: Never   Drug use: Never   Sexual activity: Never  Other Topics Concern   Not on file  Social History Narrative   ** Merged History Encounter **       Elizabeth Mcintyre is widowed. Her young grandson lives with her, for whom she shares custody with her daughter, the son's aunt.   Right handed    Social Drivers of Health   Financial Resource Strain: Low Risk  (06/04/2021)   Overall Financial Resource Strain (CARDIA)    Difficulty of Paying Living Expenses: Not hard at all  Food Insecurity: Patient Unable To Answer (05/02/2024)   Hunger Vital Sign    Worried About Running Out of Food in the Last Year: Patient unable to answer    Ran Out of Food in the Last Year: Patient unable to answer  Transportation Needs: Patient Unable To Answer (05/02/2024)   PRAPARE - Transportation    Lack of Transportation (Medical): Patient unable to answer    Lack of Transportation  (Non-Medical): Patient unable to answer  Physical Activity: Insufficiently Active (05/27/2023)   Exercise Vital Sign    Days of Exercise per Week: 1 day    Minutes of Exercise per Session: 10 min  Stress: Stress Concern Present (05/27/2023)   [employer] of Occupational Health - Occupational Stress Questionnaire    Feeling of Stress : To some extent  Social Connections: Unknown (05/02/2024)   Social Connection and Isolation Panel    Frequency of Communication with Friends and Family: Patient unable to answer    Frequency of Social Gatherings with Friends and Family: Patient unable to answer    Attends Religious Services: Patient unable to answer    Active Member of Clubs or Organizations: Not on file    Attends Banker Meetings: Patient unable to answer    Marital Status: Widowed  Intimate Partner Violence: Unknown (05/02/2024)   Humiliation, Afraid, Rape, and Kick questionnaire    Fear of Current or Ex-Partner: Patient unable to answer    Emotionally Abused: Patient unable to answer    Physically Abused: Not on file    Sexually Abused: Patient unable to answer    Outpatient Medications Prior to Visit  Medication Sig Dispense Refill   acetaminophen  (TYLENOL ) 325 MG tablet Take 325 mg by mouth every 6 (six) hours as needed for moderate pain.      albuterol  (VENTOLIN  HFA) 108 (90 Base) MCG/ACT inhaler Inhale 2 puffs into the lungs every 6 (six) hours as needed.     Ascorbic Acid (VITAMIN C ) 100 MG tablet Take 100 mg by mouth daily.     cetirizine (ZYRTEC) 10 MG tablet Take 10 mg by mouth at bedtime. As needed     Cholecalciferol  (VITAMIN D3) 25 MCG (1000 UT) CAPS Take 1,000 Units by mouth daily.      rOPINIRole  (REQUIP ) 0.25 MG tablet Take 0.25 mg by mouth at bedtime.     sucralfate  (CARAFATE ) 1 g tablet TAKE 1 TABLET BY MOUTH 4 TIMES DAILY. 360 tablet 1   vitamin B-12 (CYANOCOBALAMIN ) 1000 MCG tablet Take 1,000 mcg by mouth daily.      amLODipine  (NORVASC ) 10 MG tablet  Take 1 tablet (10 mg total) by mouth daily. 90 tablet 1   carvedilol  (COREG ) 3.125 MG tablet Take 1 tablet (3.125 mg total) by mouth 2 (two) times daily with a meal. 60 tablet  3   donepezil  (ARICEPT ) 10 MG tablet TAKE 1 TABLET BY MOUTH DAILY 30 tablet 3   gabapentin  (NEURONTIN ) 100 MG capsule Take 1 capsule (100 mg total) by mouth daily. 90 capsule 0   levothyroxine  (SYNTHROID ) 75 MCG tablet Take 75 mcg by mouth daily.     mirtazapine  (REMERON ) 15 MG tablet TAKE 1 TABLET BY MOUTH EVERYDAY AT BEDTIME 90 tablet 2   pantoprazole  (PROTONIX ) 40 MG tablet Take 40 mg by mouth daily.     rosuvastatin  (CRESTOR ) 20 MG tablet Take 20 mg by mouth daily.     No facility-administered medications prior to visit.    Allergies  Allergen Reactions   Codeine Nausea And Vomiting and Rash   Codeine Phosphate Nausea And Vomiting and Rash    ROS Review of Systems  Constitutional:  Positive for fatigue. Negative for chills and fever.  HENT:  Negative for congestion, sinus pressure, sinus pain and sore throat.   Eyes:  Positive for visual disturbance. Negative for pain and discharge.  Respiratory:  Negative for cough and shortness of breath.   Cardiovascular:  Negative for chest pain and palpitations.  Gastrointestinal:  Negative for nausea and vomiting.  Endocrine: Negative for polydipsia and polyuria.  Genitourinary:  Negative for dysuria and hematuria.  Musculoskeletal:  Positive for arthralgias (Right shoulder). Negative for neck pain and neck stiffness.  Skin:  Negative for rash.  Neurological:  Negative for weakness.  Psychiatric/Behavioral:  Positive for dysphoric mood and sleep disturbance. Negative for agitation and behavioral problems.       Objective:    Physical Exam Vitals reviewed.  Constitutional:      General: She is not in acute distress.    Appearance: She is not diaphoretic.  HENT:     Head: Normocephalic and atraumatic.     Nose: No congestion.     Mouth/Throat:     Mouth:  Mucous membranes are moist.  Eyes:     General: No scleral icterus.    Extraocular Movements: Extraocular movements intact.  Cardiovascular:     Rate and Rhythm: Normal rate and regular rhythm.     Heart sounds: Normal heart sounds. No murmur heard. Pulmonary:     Breath sounds: Normal breath sounds. No wheezing or rales.  Abdominal:     General: Bowel sounds are normal.     Palpations: Abdomen is soft.  Musculoskeletal:     Cervical back: Neck supple. No tenderness.     Right lower leg: No edema.     Left lower leg: No edema.     Comments: ROM at right shoulder limited due to pain  Skin:    General: Skin is warm.     Findings: No rash.  Neurological:     General: No focal deficit present.     Mental Status: She is alert and oriented to person, place, and time.     Sensory: No sensory deficit.     Motor: Weakness (B/l LE) present.  Psychiatric:        Mood and Affect: Mood normal.        Behavior: Behavior normal.     BP (!) 144/76 (BP Location: Left Arm)   Pulse 89   Ht 5' 2 (1.575 m)   Wt 141 lb (64 kg)   SpO2 97%   BMI 25.79 kg/m  Wt Readings from Last 3 Encounters:  05/10/24 141 lb (64 kg)  05/02/24 138 lb 10.7 oz (62.9 kg)  08/03/23 143 lb 12.8 oz (65.2 kg)  Lab Results  Component Value Date   TSH 1.189 05/18/2023   Lab Results  Component Value Date   WBC 7.6 05/01/2024   HGB 14.5 05/01/2024   HCT 40.7 05/01/2024   MCV 84.6 05/01/2024   PLT 209 05/01/2024   Lab Results  Component Value Date   NA 139 05/02/2024   K 4.2 05/02/2024   CO2 24 05/02/2024   GLUCOSE 128 (H) 05/02/2024   BUN 10 05/02/2024   CREATININE 1.50 (H) 05/02/2024   BILITOT 0.2 05/02/2024   ALKPHOS 61 05/02/2024   AST 46 (H) 05/02/2024   ALT 41 05/02/2024   PROT 6.0 (L) 05/02/2024   ALBUMIN 3.4 (L) 05/02/2024   CALCIUM  8.8 (L) 05/02/2024   ANIONGAP 9 05/02/2024   EGFR 46 (L) 08/03/2023   GFR 33.87 (L) 12/29/2019   Lab Results  Component Value Date   CHOL 277 (H)  08/03/2023   Lab Results  Component Value Date   HDL 59 08/03/2023   Lab Results  Component Value Date   LDLCALC 99 08/03/2023   Lab Results  Component Value Date   TRIG 702 (HH) 08/03/2023   Lab Results  Component Value Date   CHOLHDL 4.7 (H) 08/03/2023   Lab Results  Component Value Date   HGBA1C 6.2 (H) 08/03/2023      Assessment & Plan:   Problem List Items Addressed This Visit       Cardiovascular and Mediastinum   Essential hypertension   BP Readings from Last 1 Encounters:  05/10/24 (!) 144/76    Uncontrolled due to noncompliance Restart Amlodipine  10 mg QD and Coreg  3.125 mg BID (considering tachycardia) Counseled for compliance with the medications Advised DASH diet      Relevant Medications   amLODipine  (NORVASC ) 10 MG tablet   carvedilol  (COREG ) 3.125 MG tablet   rosuvastatin  (CRESTOR ) 10 MG tablet   Other Relevant Orders   Basic Metabolic Panel (BMET)     Digestive   Gastroesophageal reflux disease with esophagitis   Her atypical chest pain is likely due to GERD Needs to continue pantoprazole  40 mg QD regularly Avoid hot and spicy food Needs to take at least 2-3 meals in a day Has sucralfate  for persistent acid reflux, can take Pepcid  PRN as well      Relevant Medications   pantoprazole  (PROTONIX ) 40 MG tablet     Endocrine   Acquired hypothyroidism   Lab Results  Component Value Date   TSH 1.189 05/18/2023   On Levothyroxine  75 mcg QD, but needs to be compliant      Relevant Medications   carvedilol  (COREG ) 3.125 MG tablet   levothyroxine  (SYNTHROID ) 75 MCG tablet   Other Relevant Orders   TSH + free T4     Nervous and Auditory   Moderate Alzheimer's dementia (HCC)   Used to take donepezil  10 mg QD Has been noncompliant Her son reports worsening of her memory deficits, but he is going to help her arrange her medications Restart Remeron  15 mg nightly, can help with insomnia and improving appetite      Relevant Medications    donepezil  (ARICEPT ) 10 MG tablet   mirtazapine  (REMERON ) 15 MG tablet     Genitourinary   Stage 3a chronic kidney disease   Checked CMP, GFR stays around 45 - needs to improve hydration Avoid nephrotoxic agents      Relevant Orders   Basic Metabolic Panel (BMET)     Other   Major depression, recurrent, chronic (HCC)  Flowsheet Row Office Visit from 08/03/2023 in [facility]  PHQ-9 Total Score 4   Also had insomnia and decreased appetite - was better with Remeron  15 mg QD - restart Remeron , needs to be compliant      Relevant Medications   mirtazapine  (REMERON ) 15 MG tablet   Atypical chest pain - Primary   Had cardiac workup including nuclear stress test and cardiology evaluation, which was unremarkable Her chest pain/epigastric burning is likely related to GERD Needs to continue pantoprazole  40 mg QD regularly Has sucralfate  1 g 4 times daily as needed for persistent symptoms      Marginal zone lymphoma   Follows up with Oncology Has generalized abdominal pain, checked CT abdomen pelvis with contrast (05/17/23) - unremarkable  Abdominal mass likely reactive lymph node, was not seen in last CT abdomen Advised to contact if it is persistent or increasing in size      Mixed hyperlipidemia   Was on Crestor , but noncompliant Restart Crestor  at a lower dose of 10 mg QD (concern for leg cramps related noncompliance)      Relevant Medications   amLODipine  (NORVASC ) 10 MG tablet   carvedilol  (COREG ) 3.125 MG tablet   rosuvastatin  (CRESTOR ) 10 MG tablet   Hospital discharge follow-up   Hospital chart reviewed, including discharge summary Medications reconciled and reviewed with the patient in detail        Meds ordered this encounter  Medications   amLODipine  (NORVASC ) 10 MG tablet    Sig: Take 1 tablet (10 mg total) by mouth daily.    Dispense:  90 tablet    Refill:  1   carvedilol  (COREG ) 3.125 MG tablet    Sig: Take 1 tablet (3.125 mg  total) by mouth 2 (two) times daily with a meal.    Dispense:  180 tablet    Refill:  3   donepezil  (ARICEPT ) 10 MG tablet    Sig: Take 1 tablet (10 mg total) by mouth daily.    Dispense:  90 tablet    Refill:  1   levothyroxine  (SYNTHROID ) 75 MCG tablet    Sig: Take 1 tablet (75 mcg total) by mouth daily.    Dispense:  90 tablet    Refill:  1   mirtazapine  (REMERON ) 15 MG tablet    Sig: Take 1 tablet (15 mg total) by mouth at bedtime.    Dispense:  90 tablet    Refill:  2   pantoprazole  (PROTONIX ) 40 MG tablet    Sig: Take 1 tablet (40 mg total) by mouth daily.    Dispense:  90 tablet    Refill:  1   rosuvastatin  (CRESTOR ) 10 MG tablet    Sig: Take 1 tablet (10 mg total) by mouth daily.    Dispense:  90 tablet    Refill:  1    Follow-up: No follow-ups on file.    Suzzane MARLA Blanch, MD

## 2024-05-10 NOTE — Assessment & Plan Note (Signed)
 Was on Crestor , but noncompliant Restart Crestor  at a lower dose of 10 mg QD (concern for leg cramps related noncompliance)

## 2024-05-10 NOTE — Assessment & Plan Note (Signed)
 Flowsheet Row Office Visit from 08/03/2023 in [facility]  PHQ-9 Total Score 4   Also had insomnia and decreased appetite - was better with Remeron  15 mg QD - restart Remeron , needs to be compliant

## 2024-05-10 NOTE — Patient Instructions (Signed)
 Please start taking medications as prescribed.  Please follow low salt diet and ambulate as tolerated.

## 2024-05-10 NOTE — Assessment & Plan Note (Signed)
 Had cardiac workup including nuclear stress test and cardiology evaluation, which was unremarkable Her chest pain/epigastric burning is likely related to GERD Needs to continue pantoprazole  40 mg QD regularly Has sucralfate  1 g 4 times daily as needed for persistent symptoms

## 2024-05-10 NOTE — Assessment & Plan Note (Signed)
 Follows up with Oncology Has generalized abdominal pain, checked CT abdomen pelvis with contrast (05/17/23) - unremarkable  Abdominal mass likely reactive lymph node, was not seen in last CT abdomen Advised to contact if it is persistent or increasing in size

## 2024-05-10 NOTE — Assessment & Plan Note (Signed)
 Lab Results  Component Value Date   TSH 1.189 05/18/2023   On Levothyroxine 75 mcg QD, but needs to be compliant

## 2024-05-10 NOTE — Assessment & Plan Note (Signed)
 Her atypical chest pain is likely due to GERD Needs to continue pantoprazole  40 mg QD regularly Avoid hot and spicy food Needs to take at least 2-3 meals in a day Has sucralfate  for persistent acid reflux, can take Pepcid  PRN as well

## 2024-05-11 ENCOUNTER — Ambulatory Visit: Payer: Self-pay | Admitting: Internal Medicine

## 2024-05-11 LAB — BASIC METABOLIC PANEL WITH GFR
BUN/Creatinine Ratio: 13 (ref 12–28)
BUN: 19 mg/dL (ref 8–27)
CO2: 24 mmol/L (ref 20–29)
Calcium: 9.9 mg/dL (ref 8.7–10.3)
Chloride: 96 mmol/L (ref 96–106)
Creatinine, Ser: 1.43 mg/dL — ABNORMAL HIGH (ref 0.57–1.00)
Glucose: 142 mg/dL — ABNORMAL HIGH (ref 70–99)
Potassium: 4 mmol/L (ref 3.5–5.2)
Sodium: 138 mmol/L (ref 134–144)
eGFR: 37 mL/min/1.73 — ABNORMAL LOW (ref >59)

## 2024-05-11 LAB — TSH+FREE T4
Free T4: 0.96 ng/dL (ref 0.82–1.77)
TSH: 8.89 u[IU]/mL — ABNORMAL HIGH (ref 0.450–4.500)

## 2024-05-13 NOTE — Progress Notes (Signed)
 Mailed results

## 2024-05-21 ENCOUNTER — Other Ambulatory Visit: Payer: Self-pay

## 2024-05-21 ENCOUNTER — Emergency Department (HOSPITAL_COMMUNITY)

## 2024-05-21 ENCOUNTER — Inpatient Hospital Stay (HOSPITAL_COMMUNITY)

## 2024-05-21 ENCOUNTER — Inpatient Hospital Stay (HOSPITAL_COMMUNITY)
Admission: EM | Admit: 2024-05-21 | Discharge: 2024-05-28 | DRG: 872 | Disposition: A | Discharge status: Home Health | Attending: Internal Medicine | Admitting: Internal Medicine

## 2024-05-21 DIAGNOSIS — Z8249 Family history of ischemic heart disease and other diseases of the circulatory system: Secondary | ICD-10-CM

## 2024-05-21 DIAGNOSIS — R6520 Severe sepsis without septic shock: Secondary | ICD-10-CM | POA: Diagnosis not present

## 2024-05-21 DIAGNOSIS — A0472 Enterocolitis due to Clostridium difficile, not specified as recurrent: Secondary | ICD-10-CM | POA: Diagnosis not present

## 2024-05-21 DIAGNOSIS — A419 Sepsis, unspecified organism: Principal | ICD-10-CM | POA: Diagnosis present

## 2024-05-21 DIAGNOSIS — J4520 Mild intermittent asthma, uncomplicated: Secondary | ICD-10-CM | POA: Diagnosis present

## 2024-05-21 DIAGNOSIS — N390 Urinary tract infection, site not specified: Secondary | ICD-10-CM | POA: Diagnosis not present

## 2024-05-21 DIAGNOSIS — I129 Hypertensive chronic kidney disease with stage 1 through stage 4 chronic kidney disease, or unspecified chronic kidney disease: Secondary | ICD-10-CM | POA: Diagnosis not present

## 2024-05-21 DIAGNOSIS — W1830XA Fall on same level, unspecified, initial encounter: Secondary | ICD-10-CM | POA: Diagnosis present

## 2024-05-21 DIAGNOSIS — E872 Acidosis, unspecified: Secondary | ICD-10-CM | POA: Diagnosis present

## 2024-05-21 DIAGNOSIS — M069 Rheumatoid arthritis, unspecified: Secondary | ICD-10-CM | POA: Diagnosis present

## 2024-05-21 DIAGNOSIS — I10 Essential (primary) hypertension: Secondary | ICD-10-CM | POA: Diagnosis present

## 2024-05-21 DIAGNOSIS — E860 Dehydration: Secondary | ICD-10-CM | POA: Diagnosis present

## 2024-05-21 DIAGNOSIS — G2581 Restless legs syndrome: Secondary | ICD-10-CM | POA: Diagnosis present

## 2024-05-21 DIAGNOSIS — Z885 Allergy status to narcotic agent status: Secondary | ICD-10-CM

## 2024-05-21 DIAGNOSIS — M25512 Pain in left shoulder: Secondary | ICD-10-CM | POA: Diagnosis present

## 2024-05-21 DIAGNOSIS — R680 Hypothermia, not associated with low environmental temperature: Secondary | ICD-10-CM | POA: Diagnosis not present

## 2024-05-21 DIAGNOSIS — Z860100 Personal history of colon polyps, unspecified: Secondary | ICD-10-CM

## 2024-05-21 DIAGNOSIS — I6523 Occlusion and stenosis of bilateral carotid arteries: Secondary | ICD-10-CM | POA: Diagnosis not present

## 2024-05-21 DIAGNOSIS — Z83438 Family history of other disorder of lipoprotein metabolism and other lipidemia: Secondary | ICD-10-CM

## 2024-05-21 DIAGNOSIS — R404 Transient alteration of awareness: Secondary | ICD-10-CM | POA: Diagnosis not present

## 2024-05-21 DIAGNOSIS — R0989 Other specified symptoms and signs involving the circulatory and respiratory systems: Secondary | ICD-10-CM | POA: Diagnosis not present

## 2024-05-21 DIAGNOSIS — K760 Fatty (change of) liver, not elsewhere classified: Secondary | ICD-10-CM | POA: Diagnosis not present

## 2024-05-21 DIAGNOSIS — I700 Atherosclerosis of aorta: Secondary | ICD-10-CM | POA: Diagnosis not present

## 2024-05-21 DIAGNOSIS — E876 Hypokalemia: Secondary | ICD-10-CM | POA: Diagnosis present

## 2024-05-21 DIAGNOSIS — I517 Cardiomegaly: Secondary | ICD-10-CM | POA: Diagnosis not present

## 2024-05-21 DIAGNOSIS — Z751 Person awaiting admission to adequate facility elsewhere: Secondary | ICD-10-CM

## 2024-05-21 DIAGNOSIS — E1122 Type 2 diabetes mellitus with diabetic chronic kidney disease: Secondary | ICD-10-CM | POA: Diagnosis not present

## 2024-05-21 DIAGNOSIS — Z841 Family history of disorders of kidney and ureter: Secondary | ICD-10-CM

## 2024-05-21 DIAGNOSIS — Z833 Family history of diabetes mellitus: Secondary | ICD-10-CM

## 2024-05-21 DIAGNOSIS — E785 Hyperlipidemia, unspecified: Secondary | ICD-10-CM | POA: Diagnosis not present

## 2024-05-21 DIAGNOSIS — K219 Gastro-esophageal reflux disease without esophagitis: Secondary | ICD-10-CM | POA: Diagnosis not present

## 2024-05-21 DIAGNOSIS — R197 Diarrhea, unspecified: Secondary | ICD-10-CM | POA: Diagnosis present

## 2024-05-21 DIAGNOSIS — K5730 Diverticulosis of large intestine without perforation or abscess without bleeding: Secondary | ICD-10-CM | POA: Diagnosis not present

## 2024-05-21 DIAGNOSIS — E782 Mixed hyperlipidemia: Secondary | ICD-10-CM | POA: Diagnosis not present

## 2024-05-21 DIAGNOSIS — F03A0 Unspecified dementia, mild, without behavioral disturbance, psychotic disturbance, mood disturbance, and anxiety: Secondary | ICD-10-CM | POA: Diagnosis present

## 2024-05-21 DIAGNOSIS — Z9181 History of falling: Secondary | ICD-10-CM

## 2024-05-21 DIAGNOSIS — E861 Hypovolemia: Secondary | ICD-10-CM | POA: Diagnosis not present

## 2024-05-21 DIAGNOSIS — Z1152 Encounter for screening for COVID-19: Secondary | ICD-10-CM | POA: Diagnosis not present

## 2024-05-21 DIAGNOSIS — G9389 Other specified disorders of brain: Secondary | ICD-10-CM | POA: Diagnosis not present

## 2024-05-21 DIAGNOSIS — N1831 Chronic kidney disease, stage 3a: Secondary | ICD-10-CM | POA: Diagnosis present

## 2024-05-21 DIAGNOSIS — R55 Syncope and collapse: Secondary | ICD-10-CM | POA: Diagnosis not present

## 2024-05-21 DIAGNOSIS — Z8673 Personal history of transient ischemic attack (TIA), and cerebral infarction without residual deficits: Secondary | ICD-10-CM

## 2024-05-21 DIAGNOSIS — M4802 Spinal stenosis, cervical region: Secondary | ICD-10-CM | POA: Diagnosis not present

## 2024-05-21 DIAGNOSIS — M542 Cervicalgia: Secondary | ICD-10-CM | POA: Diagnosis not present

## 2024-05-21 DIAGNOSIS — A0811 Acute gastroenteropathy due to Norwalk agent: Secondary | ICD-10-CM | POA: Diagnosis present

## 2024-05-21 DIAGNOSIS — G629 Polyneuropathy, unspecified: Secondary | ICD-10-CM | POA: Diagnosis present

## 2024-05-21 DIAGNOSIS — M50322 Other cervical disc degeneration at C5-C6 level: Secondary | ICD-10-CM | POA: Diagnosis not present

## 2024-05-21 DIAGNOSIS — Z825 Family history of asthma and other chronic lower respiratory diseases: Secondary | ICD-10-CM

## 2024-05-21 DIAGNOSIS — Z8572 Personal history of non-Hodgkin lymphomas: Secondary | ICD-10-CM

## 2024-05-21 DIAGNOSIS — Z809 Family history of malignant neoplasm, unspecified: Secondary | ICD-10-CM

## 2024-05-21 DIAGNOSIS — G309 Alzheimer's disease, unspecified: Secondary | ICD-10-CM | POA: Diagnosis not present

## 2024-05-21 DIAGNOSIS — Z91199 Patient's noncompliance with other medical treatment and regimen due to unspecified reason: Secondary | ICD-10-CM

## 2024-05-21 DIAGNOSIS — E039 Hypothyroidism, unspecified: Secondary | ICD-10-CM | POA: Diagnosis not present

## 2024-05-21 DIAGNOSIS — Z8261 Family history of arthritis: Secondary | ICD-10-CM

## 2024-05-21 DIAGNOSIS — Z79899 Other long term (current) drug therapy: Secondary | ICD-10-CM

## 2024-05-21 DIAGNOSIS — Z8711 Personal history of peptic ulcer disease: Secondary | ICD-10-CM

## 2024-05-21 DIAGNOSIS — Z79890 Hormone replacement therapy (postmenopausal): Secondary | ICD-10-CM

## 2024-05-21 DIAGNOSIS — I499 Cardiac arrhythmia, unspecified: Secondary | ICD-10-CM | POA: Diagnosis not present

## 2024-05-21 LAB — COMPREHENSIVE METABOLIC PANEL WITH GFR
ALT: 56 U/L — ABNORMAL HIGH (ref 0–44)
AST: 43 U/L — ABNORMAL HIGH (ref 15–41)
Albumin: 4.6 g/dL (ref 3.5–5.0)
Alkaline Phosphatase: 73 U/L (ref 38–126)
Anion gap: 20 — ABNORMAL HIGH (ref 5–15)
BUN: 22 mg/dL (ref 8–23)
CO2: 20 mmol/L — ABNORMAL LOW (ref 22–32)
Calcium: 10.4 mg/dL — ABNORMAL HIGH (ref 8.9–10.3)
Chloride: 99 mmol/L (ref 98–111)
Creatinine, Ser: 1.35 mg/dL — ABNORMAL HIGH (ref 0.44–1.00)
GFR, Estimated: 39 mL/min — ABNORMAL LOW (ref >60)
Glucose, Bld: 219 mg/dL — ABNORMAL HIGH (ref 70–99)
Potassium: 3.7 mmol/L (ref 3.5–5.1)
Sodium: 138 mmol/L (ref 135–145)
Total Bilirubin: 0.7 mg/dL (ref 0.0–1.2)
Total Protein: 7.2 g/dL (ref 6.5–8.1)

## 2024-05-21 LAB — CBC WITH DIFFERENTIAL/PLATELET
Abs Immature Granulocytes: 0.06 K/uL (ref 0.00–0.07)
Basophils Absolute: 0.1 K/uL (ref 0.0–0.1)
Basophils Relative: 1 %
Eosinophils Absolute: 0.2 K/uL (ref 0.0–0.5)
Eosinophils Relative: 2 %
HCT: 42.2 % (ref 36.0–46.0)
Hemoglobin: 14.8 g/dL (ref 12.0–15.0)
Immature Granulocytes: 1 %
Lymphocytes Relative: 21 %
Lymphs Abs: 2.3 K/uL (ref 0.7–4.0)
MCH: 30.1 pg (ref 26.0–34.0)
MCHC: 35.1 g/dL (ref 30.0–36.0)
MCV: 85.9 fL (ref 80.0–100.0)
Monocytes Absolute: 0.6 K/uL (ref 0.1–1.0)
Monocytes Relative: 6 %
Neutro Abs: 7.6 K/uL (ref 1.7–7.7)
Neutrophils Relative %: 69 %
Platelets: 264 K/uL (ref 150–400)
RBC: 4.91 MIL/uL (ref 3.87–5.11)
RDW: 13.2 % (ref 11.5–15.5)
WBC: 10.9 K/uL — ABNORMAL HIGH (ref 4.0–10.5)
nRBC: 0 % (ref 0.0–0.2)

## 2024-05-21 LAB — MAGNESIUM: Magnesium: 1.9 mg/dL (ref 1.7–2.4)

## 2024-05-21 LAB — URINALYSIS, W/ REFLEX TO CULTURE (INFECTION SUSPECTED)
Bilirubin Urine: NEGATIVE
Glucose, UA: 50 mg/dL — AB
Ketones, ur: NEGATIVE mg/dL
Nitrite: NEGATIVE
Protein, ur: NEGATIVE mg/dL
Specific Gravity, Urine: 1.005 (ref 1.005–1.030)
pH: 8 (ref 5.0–8.0)

## 2024-05-21 LAB — I-STAT CG4 LACTIC ACID, ED
Lactic Acid, Venous: 4 mmol/L (ref 0.5–1.9)
Lactic Acid, Venous: 4.8 mmol/L (ref 0.5–1.9)
Lactic Acid, Venous: 4 mmol/L (ref 0.5–1.9)

## 2024-05-21 LAB — RESP PANEL BY RT-PCR (RSV, FLU A&B, COVID)  RVPGX2
Influenza A by PCR: NEGATIVE
Influenza B by PCR: NEGATIVE
Resp Syncytial Virus by PCR: NEGATIVE
SARS Coronavirus 2 by RT PCR: NEGATIVE

## 2024-05-21 LAB — PROTIME-INR
INR: 0.9 (ref 0.8–1.2)
Prothrombin Time: 13.2 s (ref 11.4–15.2)

## 2024-05-21 LAB — FERRITIN: Ferritin: 390 ng/mL — ABNORMAL HIGH (ref 11–307)

## 2024-05-21 LAB — TROPONIN T, HIGH SENSITIVITY
Troponin T High Sensitivity: <15 ng/L (ref 0–19)
Troponin T High Sensitivity: <15 ng/L (ref 0–19)

## 2024-05-21 LAB — LIPASE, BLOOD: Lipase: 99 U/L — ABNORMAL HIGH (ref 11–51)

## 2024-05-21 LAB — LACTIC ACID, PLASMA: Lactic Acid, Venous: 2.9 mmol/L (ref 0.5–1.9)

## 2024-05-21 LAB — PROCALCITONIN: Procalcitonin: <0.1 ng/mL

## 2024-05-21 LAB — VITAMIN B12: Vitamin B-12: 777 pg/mL (ref 180–914)

## 2024-05-21 MED ORDER — DONEPEZIL HCL 5 MG PO TABS
10.0000 mg | ORAL_TABLET | Freq: Every day | ORAL | Status: DC
Start: 2024-05-22 — End: 2024-05-28
  Administered 2024-05-22 – 2024-05-28 (×7): 10 mg via ORAL
  Filled 2024-05-21 (×7): qty 2

## 2024-05-21 MED ORDER — PANTOPRAZOLE SODIUM 40 MG PO TBEC
40.0000 mg | DELAYED_RELEASE_TABLET | Freq: Every day | ORAL | Status: DC
  Administered 2024-05-22 – 2024-05-23 (×2): 40 mg via ORAL
  Filled 2024-05-21 (×2): qty 1

## 2024-05-21 MED ORDER — ONDANSETRON HCL 4 MG PO TABS
4.0000 mg | ORAL_TABLET | Freq: Four times a day (QID) | ORAL | Status: DC | PRN
Start: 2024-05-21 — End: 2024-05-22

## 2024-05-21 MED ORDER — IOHEXOL 300 MG/ML  SOLN
80.0000 mL | Freq: Once | INTRAMUSCULAR | Status: AC | PRN
  Administered 2024-05-21: 80 mL via INTRAVENOUS

## 2024-05-21 MED ORDER — ROSUVASTATIN CALCIUM 20 MG PO TABS
10.0000 mg | ORAL_TABLET | Freq: Every day | ORAL | Status: DC
  Administered 2024-05-22 – 2024-05-28 (×7): 10 mg via ORAL
  Filled 2024-05-21 (×7): qty 1

## 2024-05-21 MED ORDER — LIDOCAINE 5 % EX PTCH
1.0000 | MEDICATED_PATCH | CUTANEOUS | Status: DC
  Administered 2024-05-21 – 2024-05-26 (×3): 1 via TRANSDERMAL
  Filled 2024-05-21 (×4): qty 1

## 2024-05-21 MED ORDER — VANCOMYCIN HCL IN DEXTROSE 1-5 GM/200ML-% IV SOLN
1000.0000 mg | Freq: Once | INTRAVENOUS | Status: AC
  Administered 2024-05-21: 1000 mg via INTRAVENOUS
  Filled 2024-05-21: qty 200

## 2024-05-21 MED ORDER — LACTATED RINGERS IV BOLUS (SEPSIS)
1000.0000 mL | Freq: Once | INTRAVENOUS | Status: AC
  Administered 2024-05-21: 1000 mL via INTRAVENOUS

## 2024-05-21 MED ORDER — IOHEXOL 300 MG/ML  SOLN: 75.0000 mL | Freq: Once | INTRAMUSCULAR | Status: DC | PRN

## 2024-05-21 MED ORDER — SODIUM CHLORIDE 0.9 % IV SOLN
2.0000 g | Freq: Once | INTRAVENOUS | Status: AC
  Administered 2024-05-21: 2 g via INTRAVENOUS
  Filled 2024-05-21: qty 12.5

## 2024-05-21 MED ORDER — AMLODIPINE BESYLATE 5 MG PO TABS
10.0000 mg | ORAL_TABLET | Freq: Every day | ORAL | Status: DC
  Administered 2024-05-21 – 2024-05-28 (×8): 10 mg via ORAL
  Filled 2024-05-21 (×8): qty 2

## 2024-05-21 MED ORDER — ROPINIROLE HCL 0.25 MG PO TABS
0.2500 mg | ORAL_TABLET | Freq: Every day | ORAL | Status: DC
  Administered 2024-05-21 – 2024-05-27 (×7): 0.25 mg via ORAL
  Filled 2024-05-21 (×8): qty 1

## 2024-05-21 MED ORDER — METOPROLOL TARTRATE 5 MG/5ML IV SOLN: 5.0000 mg | Freq: Three times a day (TID) | INTRAVENOUS | Status: DC | PRN

## 2024-05-21 MED ORDER — METRONIDAZOLE 500 MG/100ML IV SOLN
500.0000 mg | Freq: Once | INTRAVENOUS | Status: AC
  Administered 2024-05-21: 500 mg via INTRAVENOUS
  Filled 2024-05-21: qty 100

## 2024-05-21 MED ORDER — MIRTAZAPINE 15 MG PO TABS
15.0000 mg | ORAL_TABLET | Freq: Every day | ORAL | Status: DC
  Administered 2024-05-22 – 2024-05-27 (×6): 15 mg via ORAL
  Filled 2024-05-21 (×3): qty 2
  Filled 2024-05-21 (×2): qty 1
  Filled 2024-05-21 (×2): qty 2

## 2024-05-21 MED ORDER — HYDROCODONE-ACETAMINOPHEN 5-325 MG PO TABS
1.0000 | ORAL_TABLET | Freq: Four times a day (QID) | ORAL | Status: DC | PRN
  Administered 2024-05-22 – 2024-05-25 (×5): 1 via ORAL
  Filled 2024-05-21 (×5): qty 1

## 2024-05-21 MED ORDER — FENTANYL CITRATE PF 50 MCG/ML IJ SOSY: 50.0000 ug | PREFILLED_SYRINGE | Freq: Once | INTRAMUSCULAR | Status: DC

## 2024-05-21 MED ORDER — ACETAMINOPHEN 325 MG PO TABS
650.0000 mg | ORAL_TABLET | Freq: Four times a day (QID) | ORAL | Status: DC | PRN
  Administered 2024-05-22 – 2024-05-25 (×5): 650 mg via ORAL
  Filled 2024-05-21 (×5): qty 2

## 2024-05-21 MED ORDER — ACETAMINOPHEN 650 MG RE SUPP: 650.0000 mg | Freq: Four times a day (QID) | RECTAL | Status: DC | PRN

## 2024-05-21 MED ORDER — ENOXAPARIN SODIUM 30 MG/0.3ML IJ SOSY
30.0000 mg | PREFILLED_SYRINGE | INTRAMUSCULAR | Status: DC
  Administered 2024-05-21 – 2024-05-23 (×3): 30 mg via SUBCUTANEOUS
  Filled 2024-05-21 (×3): qty 0.3

## 2024-05-21 MED ORDER — SODIUM CHLORIDE 0.9 % IV SOLN
1.0000 g | INTRAVENOUS | Status: DC
  Administered 2024-05-21 – 2024-05-23 (×3): 1 g via INTRAVENOUS
  Filled 2024-05-21 (×3): qty 10

## 2024-05-21 MED ORDER — LACTATED RINGERS IV SOLN: INTRAVENOUS | Status: DC

## 2024-05-21 MED ORDER — ONDANSETRON HCL 4 MG/2ML IJ SOLN
4.0000 mg | Freq: Four times a day (QID) | INTRAMUSCULAR | Status: DC | PRN
  Filled 2024-05-21: qty 2

## 2024-05-21 MED ORDER — LEVOTHYROXINE SODIUM 50 MCG PO TABS
75.0000 ug | ORAL_TABLET | Freq: Every day | ORAL | Status: DC
  Administered 2024-05-22 – 2024-05-28 (×7): 75 ug via ORAL
  Filled 2024-05-21 (×6): qty 1

## 2024-05-21 MED ORDER — TRIMETHOBENZAMIDE HCL 100 MG/ML IM SOLN
200.0000 mg | Freq: Once | INTRAMUSCULAR | Status: AC
  Administered 2024-05-21: 200 mg via INTRAMUSCULAR
  Filled 2024-05-21: qty 2

## 2024-05-21 NOTE — Assessment & Plan Note (Signed)
 Continue requip  Check ferritin

## 2024-05-21 NOTE — Assessment & Plan Note (Signed)
 Stable, SABA PRN

## 2024-05-21 NOTE — ED Triage Notes (Signed)
 BIBA from home for witnessed syncopal episode, assisted to ground, no head strike. Family thinks it may be related to hypokalemia. Pt has had  vomiting, diarrhea for 2 weeks, recently admitted to [facility]. Hx of dementia, A&O x 2. 500 ml LR given PTA. 22 l hand. 200/100 bp 58 hr 97% r/a 161 cbg

## 2024-05-21 NOTE — Assessment & Plan Note (Signed)
 Delirium precautions Continue aricept  and remeron 

## 2024-05-21 NOTE — Assessment & Plan Note (Signed)
 Complains of diarrhea x 2 weeks, but has history of chronic diarrhea in her chart Also had episodes of emesis in ED, likely why elevated lipase CT abdomen/pelvis with no acute findings Will check stool studies/C.Diff with hx of recent hospitalization TSH just checked Likely poor gut health with poor diet Trend lipase

## 2024-05-21 NOTE — Assessment & Plan Note (Signed)
 Baseline creatinine around 1.2-1.5 Stable, continue to monitor

## 2024-05-21 NOTE — Assessment & Plan Note (Signed)
 Continue protonix

## 2024-05-21 NOTE — Assessment & Plan Note (Signed)
 Unsure if from the fall Xray negative for acute finding  CT cervical spine with no acute findings, but moderate to marked severity of DDD at level of C5-C6 She has no radicular symptoms Lidocaine  patch

## 2024-05-21 NOTE — Assessment & Plan Note (Signed)
 Continue crestor '10mg'$  daily

## 2024-05-21 NOTE — H&P (Addendum)
 History and Physical    Patient: Elizabeth Mcintyre FMW:[id number] DOB: 07/20/1941 DOA: 05/21/2024 DOS: the patient was seen and examined on 05/21/2024 PCP: [name], MD  Patient coming from: Home - lives with her son. Ambulates independently    Chief Complaint: syncope with fall and diarrhea x 2 weeks   HPI: Elizabeth Mcintyre is a 83 y.o. female with medical history significant of asthma, HTN, hypothyroidism, dementia, B12 deficiency, RLS, peripheral neuropathy, GERD, gout, HLD, carotid artery stenosis who presented to ED with complaints of syncope and history of vomiting and diarrhea x 2 weeks. She tells me she was at home and apparently passed out and fell. She can't tell me if she hit her head. Family and patient unsure how long she was out for. Her granddaughter called 911. Her sister reports she has not been feeling well the past few weeks. She has had stomach upset, diarrhea and a few episodes of vomiting this morning. She denies any fever/chills. She denies any dysuria, urgency or frequency. She has lower abdominal pain she rates as a 5-6 and described as achy. Pain is constant. Nothing seems to make it better or worse. She has had poor PO intake. Sister states it's been quite awhile that she has eaten well and she tells me she doesn't really like food anymore.   Discharged from [facility] on 05/02/24 for atypical chest pain, ACS ruled out, hyponatremia and acute on chronic kidney disease.    Denies any fever/chills, vision changes/headaches, chest pain or palpitations, shortness of breath or cough, dysuria or leg swelling.    She does not smoke or drink.   ER Course:  temp: 96, bp: 144/85, HR:: 63, RR: 28, oxygen: 100%RA Pertinent labs: lipase: 99, WBC: 10.9, creatinine: 1.35, calcium : 10.4, AST: 43, ALT: 56, lactic acid: 4.0, UA: many bacterial >10WBC, large LE  CXR: no acute findings CT chest/abdomen/pelvis: no acute abnormality. Diverticulosis. Aortic atherosclerosis.  In eD: code sepsis.   given 2L IVF, flagyl , cefepime  and vanc. BC/urine culture obtained. [facility] asked to admit.     Review of Systems: As mentioned in the history of present illness. All other systems reviewed and are negative. Past Medical History:  Diagnosis Date   Acquired hypothyroidism 05/30/2014   Acute cystitis without hematuria 10/09/2022   Acute pain of right shoulder 04/16/2022   Asthma    Bloating 04/26/2019   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   Cancer 2021   Lymphoma   Carotid artery stenosis 06/13/2016   Chronic SI joint pain    was on tramadol    Diverticulosis 03/2011   Dysphagia 04/18/2020   Essential hypertension, benign 11/09/2007   Current med losartan  100 mg qd, amlodopine 10 mg qd   Fall 07/22/2021   Fecal incontinence 07/07/2019   Gastroesophageal reflux disease with esophagitis 03/06/2015   Gout 06/09/2014   R great toe, ball of foot Stopped HCTZ several mos ago,  On losartan         Hematemesis 03/30/2020   Hepatic steatosis 01/04/2020   Herpes zoster without complication 12/02/2022   History of rheumatoid arthritis    during 30's, was treated.   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    Stress incontinence    Thrombocytopenia 01/04/2020   Type II diabetes  mellitus 11/09/2007   diet control   Vitamin D  deficiency 05/25/2015   Past Surgical History:  Procedure Laterality Date   ABDOMINAL HYSTERECTOMY     BALLOON DILATION N/A 08/21/2020   Procedure: BALLOON DILATION;  Surgeon: Cindie Carlin POUR, DO;  Location: [facility];  Service: Endoscopy;  Laterality: N/A;   BIOPSY  03/30/2020   Procedure: BIOPSY;  Surgeon: Donnald Charleston, MD;  Location: [facility];  Service: Endoscopy;;   BIOPSY  05/02/2020   Procedure: BIOPSY;  Surgeon: Eartha Flavors, Toribio, MD;  Location: [facility];  Service: Gastroenterology;;   CARDIAC CATHETERIZATION     X 2, last one in 1998   CHOLECYSTECTOMY N/A 04/13/2017   Procedure: LAPAROSCOPIC CHOLECYSTECTOMY;  Surgeon: Mavis Anes, MD;  Location: [facility];  Service: General;  Laterality: N/A;   COLONOSCOPY     COLONOSCOPY  May 2012   Dr. Obie: mild diverticulosis, otherwise normal.    COLONOSCOPY WITH PROPOFOL  N/A 05/02/2020   Dr. Eartha: 8 mm polyp removed from the ascending colon, 2 mm polyp removed from the ascending colon.  Tubular adenomas.  Diverticulosis.  Mucosal ulceration in the sigmoid colon noted, pathology consistent with ischemic colitis.   ESOPHAGOGASTRODUODENOSCOPY N/A 01/28/2015   Dr. Shaaron: reflux esophagitis, Schatzki's ring not manipulated due to recent bleeding   ESOPHAGOGASTRODUODENOSCOPY N/A 03/30/2015   Dr. Shaaron: Schatzki's ring s/p Agapito dilation, previously noted esophageal ulcer completely healed   ESOPHAGOGASTRODUODENOSCOPY N/A 03/30/2020   Buccini: Moderately severe erosive, circumferential, confluent esophagitis with no bleeding found 25 to 40 cm from incisors.  Nonobstructing and mild Schatzki ring, there were also multiple distal esophageal rings noted, minimal hiatal hernia.   ESOPHAGOGASTRODUODENOSCOPY (EGD) WITH PROPOFOL  N/A 08/21/2020   Procedure: ESOPHAGOGASTRODUODENOSCOPY (EGD) WITH PROPOFOL ;  Surgeon: Cindie Carlin POUR, DO;  Location: [facility];  Service: Endoscopy;   Laterality: N/A;  2:15pm   IR IMAGING GUIDED PORT INSERTION  03/13/2020   IR REMOVAL TUN ACCESS W/ PORT W/O FL MOD SED  10/01/2021   LYMPH NODE BIOPSY Left 03/20/2020   Procedure: LEFT POSTERIOR CERVICAL LYMPH NODE BIOPSY;  Surgeon: Sebastian Moles, MD;  [facility];  Service: General;  Laterality: Left;   MALONEY DILATION N/A 03/30/2015   Procedure: AGAPITO HODGKIN;  Surgeon: Charleston CHRISTELLA Shaaron, MD;  Location: [facility];  Service: Endoscopy;  Laterality: N/A;   POLYPECTOMY  05/02/2020   Procedure: POLYPECTOMY;  Surgeon: Eartha Flavors Toribio, MD;  Location: [facility];  Service: Gastroenterology;;   Social History:  reports that she has never smoked. She has never used smokeless tobacco. She reports that she does not drink alcohol and does not use drugs.  Allergies  Allergen Reactions   Codeine Nausea And Vomiting and Rash    Family History  Problem Relation Age of Onset   Heart disease Mother    Osteoarthritis Mother    Sudden death Father    Single kidney Father    Other Father        h/o  severe MVA injuries   Hyperlipidemia Sister    Other Daughter        Myalgias   Fibromyalgia Daughter    Allergies Daughter    Heart disease Maternal Grandfather    Sudden death Paternal Grandmother    Diabetes Paternal Grandfather    Heart disease Daughter    Other Daughter        palpitations   Pulmonary fibrosis Maternal Aunt    Cancer Paternal Uncle    Pulmonary fibrosis Maternal Aunt    Colon cancer Neg Hx     Prior to Admission medications   Medication Sig Start Date End Date Taking? Authorizing Provider  acetaminophen  (TYLENOL ) 325 MG tablet Take 325 mg by mouth every 6 (six) hours as needed for moderate pain.     [provider]  albuterol  (VENTOLIN  HFA) 108 (90 Base) MCG/ACT inhaler Inhale 2 puffs into the lungs every 6 (six) hours as needed. 05/08/23   [provider]  amLODipine  (NORVASC ) 10 MG tablet Take 1 tablet (10 mg total) by mouth daily.  05/10/24   [name], MD  Ascorbic Acid (VITAMIN C ) 100 MG tablet Take 100 mg by mouth daily.    [provider]  carvedilol  (COREG ) 3.125 MG tablet Take 1 tablet (3.125 mg total) by mouth 2 (two) times daily with a meal. 05/10/24   [name], MD  cetirizine (ZYRTEC) 10 MG tablet Take 10 mg by mouth at bedtime. As needed    [provider]  Cholecalciferol  (VITAMIN D3) 25 MCG (1000 UT) CAPS Take 1,000 Units by mouth daily.     [provider]  donepezil  (ARICEPT ) 10 MG tablet Take 1 tablet (10 mg total) by mouth daily. 05/10/24   [name], MD  levothyroxine  (SYNTHROID ) 75 MCG tablet Take 1 tablet (75 mcg total) by mouth daily. 05/10/24   [name], MD  mirtazapine  (REMERON ) 15 MG tablet Take 1 tablet (15 mg total) by mouth at bedtime. 05/10/24   [name], MD  pantoprazole  (PROTONIX ) 40 MG tablet Take 1 tablet (40 mg total) by mouth daily. 05/10/24   [name], MD  rOPINIRole  (REQUIP ) 0.25 MG tablet Take 0.25 mg by mouth at bedtime. 01/30/23   [provider]  rosuvastatin  (CRESTOR ) 10 MG tablet Take 1 tablet (10 mg total) by mouth daily. 05/10/24   [name], MD  sucralfate  (CARAFATE ) 1 g tablet TAKE 1 TABLET BY MOUTH 4 TIMES DAILY. 09/28/23   [name], MD  vitamin B-12 (CYANOCOBALAMIN ) 1000 MCG tablet Take 1,000 mcg by mouth daily.     [provider]    Physical Exam: Vitals:   05/21/24 1910 05/21/24 2013 05/21/24 2030 05/21/24 2100  BP:   (!) 141/75 (!) 153/90  Pulse:   77 76  Resp:   18 15  Temp:  97.6 F (36.4 C)    TempSrc:  Oral    SpO2: 97%  100% 98%   General:  Appears calm and comfortable and is in NAD Eyes:  PERRL, EOMI, normal lids, iris ENT:  grossly normal hearing, lips & tongue, mmm; appropriate dentition Neck:  no LAD, masses or thyromegaly; no carotid bruits Cardiovascular:  RRR, no m/r/g. No LE edema.  Respiratory:   CTA bilaterally with no wheezes/rales/rhonchi.  Normal respiratory  effort. Abdomen:  soft, NT, ND, NABS Back:   normal alignment, no CVAT Skin:  no rash or induration seen on limited exam Musculoskeletal:  grossly normal tone BUE/BLE,  good ROM, no bony abnormality. TTP Over left humeral head  Lower extremity:  No LE edema.  Limited foot exam with no ulcerations.  2+ distal pulses. Psychiatric:  grossly normal mood and affect, speech fluent and appropriate, Aox2 (baseline)  Neurologic:  CN 2-12 grossly intact, moves all extremities in coordinated fashion, sensation intact   Radiological Exams on Admission: Independently reviewed - see discussion in A/P where applicable  CT CERVICAL SPINE WO CONTRAST Result Date: 05/21/2024 CLINICAL DATA:  Syncope. EXAM: CT CERVICAL SPINE WITHOUT CONTRAST TECHNIQUE: Multidetector CT imaging of the cervical spine was performed without intravenous contrast. Multiplanar CT image reconstructions were also generated. RADIATION DOSE REDUCTION: This exam was performed according to the departmental dose-optimization program which includes automated exposure control, adjustment of the mA and/or kV according to patient size and/or use of iterative reconstruction technique. COMPARISON:  None Available. FINDINGS: Alignment: Normal. Skull base and vertebrae: No acute fracture. No primary bone lesion or focal pathologic process. Soft tissues and spinal canal: No prevertebral fluid or swelling. No visible canal hematoma. Disc levels: Moderate severity endplate sclerosis is seen at the level of C5-C6. Very mild endplate sclerosis is present throughout the remainder of the cervical spine. Moderate to marked severity intervertebral disc space narrowing is also noted at the level of C5-C6 with mild multilevel intervertebral disc space narrowing throughout the remainder of the cervical spine. Mild, bilateral multilevel facet joint hypertrophy is noted. Upper chest: Negative. Other: None. IMPRESSION: 1. No acute fracture or subluxation in the cervical spine.  2. 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 Electronically Signed   By: Suzen Dials M.D.   On: 05/21/2024 18:30   CT HEAD WO CONTRAST ( ) Result Date: 05/21/2024 CLINICAL DATA:  Syncopal episode. EXAM: CT HEAD WITHOUT CONTRAST TECHNIQUE: Contiguous axial images were obtained from the base of the skull through the vertex without intravenous contrast. RADIATION DOSE REDUCTION: This exam was performed according to the departmental dose-optimization program which includes automated exposure control, adjustment of the mA and/or kV according to patient size and/or use of iterative reconstruction technique. COMPARISON:  Jan 30, 2021 FINDINGS: Brain: There is generalized cerebral atrophy with widening of the extra-axial spaces and ventricular dilatation. There are areas of decreased attenuation within the white matter tracts of the supratentorial brain, consistent with microvascular disease changes. Vascular: Marked severity bilateral cavernous carotid artery calcification is noted. Skull: Normal. Negative for fracture or focal lesion. Sinuses/Orbits: No acute finding. Other: None. IMPRESSION: 1. Generalized cerebral atrophy and microvascular disease changes of the supratentorial brain. 2. No acute intracranial abnormality. Electronically Signed   By: Suzen Dials M.D.   On: 05/21/2024 18:27   DG Cervical Spine 2 or 3 views Result Date: 05/21/2024 CLINICAL DATA:  Neck pain after fall. EXAM: CERVICAL SPINE - 2-3 VIEW COMPARISON:  None Available. FINDINGS: Only the first 5 cervical vertebra are adequately visualized. No fracture or spondylolisthesis is seen involving the visualized vertebra. Pathology involving C6 and C7 vertebra cannot be excluded on the basis of this exam. IMPRESSION: Only the first 5 cervical vertebra are adequately visualized. No fracture or spondylolisthesis is seen involving the visualized vertebra. Pathology involving C6 and C7 vertebra cannot be  excluded on the basis of this exam. CT scan is recommended for further evaluation. Electronically Signed   By: Lynwood Landy Raddle M.D.   On: 05/21/2024 17:17   DG Shoulder Left Result Date: 05/21/2024 CLINICAL DATA:  Left shoulder pain after fall. EXAM: LEFT SHOULDER - 2+  VIEW COMPARISON:  None Available. FINDINGS: There is no evidence of fracture or dislocation. There is no evidence of arthropathy or other focal bone abnormality. Soft tissues are unremarkable. IMPRESSION: Negative. Electronically Signed   By: Lynwood Landy Raddle M.D.   On: 05/21/2024 17:15   CT CHEST ABDOMEN PELVIS W CONTRAST Result Date: 05/21/2024 CLINICAL DATA:  Witnessed syncopal episode. Two-week of vomiting and diarrhea EXAM: CT CHEST, ABDOMEN, AND PELVIS WITH CONTRAST TECHNIQUE: Multidetector CT imaging of the chest, abdomen and pelvis was performed following the standard protocol during bolus administration of intravenous contrast. RADIATION DOSE REDUCTION: This exam was performed according to the departmental dose-optimization program which includes automated exposure control, adjustment of the mA and/or kV according to patient size and/or use of iterative reconstruction technique. CONTRAST:  80mL OMNIPAQUE  IOHEXOL  300 MG/ML  SOLN COMPARISON:  CT chest, abdomen, and pelvis dated 05/17/2023 FINDINGS: CT CHEST FINDINGS Cardiovascular: Normal heart size. No significant pericardial fluid/thickening. Great vessels are normal in course and caliber. No central pulmonary emboli. Coronary artery calcifications. Mediastinum/Nodes: Imaged thyroid  gland without nodules meeting criteria for imaging follow-up by size. Normal esophagus. No pathologically enlarged axillary, supraclavicular, mediastinal, or hilar lymph nodes. Lungs/Pleura: The central airways are patent. No focal consolidation. No pneumothorax. No pleural effusion. Musculoskeletal: No acute or abnormal lytic or blastic osseous lesions. Multilevel degenerative changes of the thoracic spine. CT  ABDOMEN PELVIS FINDINGS Hepatobiliary: No focal hepatic lesions. No intra or extrahepatic biliary ductal dilation. Cholecystectomy. Pancreas: No focal lesions or main ductal dilation. Spleen: Normal in size without focal abnormality. Adrenals/Urinary Tract: No adrenal nodules. No suspicious renal mass, calculi, or hydronephrosis. Unchanged exophytic right interpolar 1.5 cm simple cyst (2:61). No specific follow-up imaging recommended. No focal bladder wall thickening. Stomach/Bowel: Normal appearance of the stomach. No evidence of bowel wall thickening, distention, or inflammatory changes. Colonic diverticulosis without acute diverticulitis. Normal appendix. Vascular/Lymphatic: Aortic atherosclerosis. No enlarged abdominal or pelvic lymph nodes. Reproductive: No adnexal masses. Other: No free fluid, fluid collection, or free air. Musculoskeletal: No acute or abnormal lytic or blastic osseous findings. Multilevel degenerative changes of the lumbar spine. IMPRESSION: 1. No acute abnormality in the chest, abdomen, or pelvis. 2. Colonic diverticulosis without acute diverticulitis. 3. Aortic Atherosclerosis (ICD10-I70.0). Coronary artery calcifications. Assessment for potential risk factor modification, dietary therapy or pharmacologic therapy may be warranted, if clinically indicated. Electronically Signed   By: Limin  Xu M.D.   On: 05/21/2024 14:34   DG Chest Port 1 View Result Date: 05/21/2024 CLINICAL DATA:  Syncope. EXAM: PORTABLE CHEST 1 VIEW COMPARISON:  05/01/2024. FINDINGS: Trachea is midline. Heart is mildly enlarged, stable. Thoracic aorta is calcified. Lungs are low in volume with minimal bibasilar atelectasis. No pleural fluid. IMPRESSION: Low lung volumes.  No acute findings. Electronically Signed   By: Newell Eke M.D.   On: 05/21/2024 14:03    EKG: Independently reviewed.  NSR with rate 69; nonspecific ST changes with no evidence of acute ischemia, RBBB   Labs on Admission: I have personally  reviewed the available labs and imaging studies at the time of the admission.  Pertinent labs:   lipase: 99,  WBC: 10.9,  creatinine: 1.35,  calcium : 10.4,  AST: 43,  ALT: 56,  lactic acid: 4.0,  UA: many bacterial >10WBC, large LE   Assessment and Plan: Principal Problem:   Severe sepsis from UTI Active Problems:   Syncope   Acute pain of left shoulder   Diarrhea   Essential hypertension   Mild intermittent asthma   Mild  dementia, unclear etiology   Hypothyroidism   Gastroesophageal reflux disease   Dyslipidemia   Stage 3a chronic kidney disease   Restless legs syndrome   UTI (urinary tract infection)    Assessment and Plan: * Severe sepsis from UTI 83 year old presenting with syncope and fall found to meet severe sepsis criteria with temp of 96, RR of 28 and lactic acid of 4.8 with urine suspicious for infection  -admit to progressive -given appropriate IVF in ED, continue overnight  -trend lactic acid -CXR negative, pan cultured -give broad spectrum abx in ED, narrow to rocephin   -trend PCT -follow cultures  -temp improved with blankets, continue to monitor temperature   Syncope Witnessed syncope with fall with family. Per one family member, son caught her She does not recall this at all Hx of poor PO intake with diarrhea, ? Orthostatic etiology  CT head with no acute finding, no neuro deficit on exam  Troponin wnl  On tele Check echo  Check orthostatics   Acute pain of left shoulder Unsure if from the fall Xray negative for acute finding  CT cervical spine with no acute findings, but moderate to marked severity of DDD at level of C5-C6 She has no radicular symptoms Lidocaine  patch   Diarrhea Complains of diarrhea x 2 weeks, but has history of chronic diarrhea in her chart Also had episodes of emesis in ED, likely why elevated lipase CT abdomen/pelvis with no acute findings Will check stool studies/C.Diff with hx of recent hospitalization TSH just  checked Likely poor gut health with poor diet Trend lipase   Essential hypertension Has not been taking medication per PCP note in August.  Now with son who has taken over medication. He states when he gave both pills it made her sick and she couldn't breath and felt like her bp bottomed out. Will start back norvasc  10mg  daily and see how she does.  PRN loperssor IV with parameters   Mild intermittent asthma Stable, SABA PRN   Mild dementia, unclear etiology Delirium precautions Continue aricept  and remeron    Hypothyroidism TSH abnormal with PCP with note of noncompliance  Continue synthroid    Gastroesophageal reflux disease Continue protonix    Dyslipidemia Continue crestor  10mg  daily  Stage 3a chronic kidney disease Baseline creatinine around 1.2-1.5 Stable, continue to monitor    Restless legs syndrome Continue requip  Check ferritin     Advance Care Planning:   Code Status: Full Code   Consults: PT/OT   DVT Prophylaxis: lovenox    Family Communication: sister at bedside   Severity of Illness: The appropriate patient status for this patient is INPATIENT. Inpatient status is judged to be reasonable and necessary in order to provide the required intensity of service to ensure the patient's safety. The patient's presenting symptoms, physical exam findings, and initial radiographic and laboratory data in the context of their chronic comorbidities is felt to place them at high risk for further clinical deterioration. Furthermore, it is not anticipated that the patient will be medically stable for discharge from the hospital within 2 midnights of admission.   * I certify that at the point of admission it is my clinical judgment that the patient will require inpatient hospital care spanning beyond 2 midnights from the point of admission due to high intensity of service, high risk for further deterioration and high frequency of surveillance required.*  Author: Isaiah Geralds, MD 05/21/2024 10:01 PM  For on call review [email or web address].

## 2024-05-21 NOTE — ED Notes (Signed)
 ED TO INPATIENT HANDOFF REPORT  ED Nurse Name and Phone #: Randolm CROME EMTP  S Name/Age/Gender Elizabeth Mcintyre 83 y.o. female Room/Bed: WA23/WA23  Code Status   Code Status: Full Code  Home/SNF/Other Home Patient oriented to: self, place, time, situation Is this baseline? Yes  Triage Complete: Triage complete  Chief Complaint Severe sepsis (HCC) [A41.9, R65.20]  Triage Note BIBA from home for witnessed syncopal episode, assisted to ground, no head strike. Family thinks it may be related to hypokalemia. Pt has had  vomiting, diarrhea for 2 weeks, recently admitted to [facility]. Hx of dementia, A&O x 2. 500 ml LR given PTA. 22 l hand. 200/100 bp 58 hr 97% r/a 161 cbg   Allergies Allergies  Allergen Reactions   Codeine Nausea And Vomiting and Rash    Level of Care/Admitting Diagnosis ED Disposition     ED Disposition  Admit   Condition  --   Comment  Hospital Area: [facility] [[id number]]  Level of Care: Progressive [102]  Admit to Progressive based on following criteria: MULTISYSTEM THREATS such as stable sepsis, metabolic/electrolyte imbalance with or without encephalopathy that is responding to early treatment.  May admit patient to [facility] or [facility] if equivalent level of care is available:: Yes  Covid Evaluation: Confirmed COVID Negative  Diagnosis: Severe sepsis [facility]) [[id number]]  Admitting Physician: WADDELL RAKE [[id number]]  Attending Physician: WOLFE, ALLISON [phone]  Certification:: I certify this patient will need inpatient services for at least 2 midnights  Expected Medical Readiness: 05/23/2024          B Medical/Surgery History Past Medical History:  Diagnosis Date   Acquired hypothyroidism 05/30/2014   Acute cystitis without hematuria 10/09/2022   Acute pain of right shoulder 04/16/2022   Asthma    Bloating 04/26/2019   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   Cancer 2021   Lymphoma   Carotid artery stenosis 06/13/2016    Chronic SI joint pain    was on tramadol    Diverticulosis 03/2011   Dysphagia 04/18/2020   Essential hypertension, benign 11/09/2007   Current med losartan  100 mg qd, amlodopine 10 mg qd   Fall 07/22/2021   Fecal incontinence 07/07/2019   Gastroesophageal reflux disease with esophagitis 03/06/2015   Gout 06/09/2014   R great toe, ball of foot Stopped HCTZ several mos ago,  On losartan         Hematemesis 03/30/2020   Hepatic steatosis 01/04/2020   Herpes zoster without complication 12/02/2022   History of rheumatoid arthritis    during 30's, was treated.   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    Stress incontinence    Thrombocytopenia 01/04/2020   Type II diabetes mellitus 11/09/2007   diet control   Vitamin  D deficiency 05/25/2015   Past Surgical History:  Procedure Laterality Date   ABDOMINAL HYSTERECTOMY     BALLOON DILATION N/A 08/21/2020   Procedure: BALLOON DILATION;  Surgeon: Cindie Carlin POUR, DO;  [facility];  Service: Endoscopy;  Laterality: N/A;   BIOPSY  03/30/2020   Procedure: BIOPSY;  Surgeon: Donnald Charleston, MD;  Location: [facility];  Service: Endoscopy;;   BIOPSY  05/02/2020   Procedure: BIOPSY;  Surgeon: Eartha Flavors,  Toribio, MD;  Location: [facility];  Service: Gastroenterology;;   CARDIAC CATHETERIZATION     X 2, last one in 1998   CHOLECYSTECTOMY N/A 04/13/2017   Procedure: LAPAROSCOPIC CHOLECYSTECTOMY;  Surgeon: Mavis Anes, MD;  Location: [facility];  Service: General;  Laterality: N/A;   COLONOSCOPY     COLONOSCOPY  May 2012   Dr. Obie: mild diverticulosis, otherwise normal.    COLONOSCOPY WITH PROPOFOL  N/A 05/02/2020   Dr. Eartha: 8 mm polyp removed from the ascending colon, 2 mm polyp removed from the ascending colon.  Tubular adenomas.  Diverticulosis.  Mucosal ulceration in the sigmoid colon noted, pathology consistent with ischemic colitis.   ESOPHAGOGASTRODUODENOSCOPY N/A 01/28/2015   Dr. Shaaron: reflux esophagitis, Schatzki's ring not manipulated due to recent bleeding   ESOPHAGOGASTRODUODENOSCOPY N/A 03/30/2015   Dr. Shaaron: Schatzki's ring s/p Agapito dilation, previously noted esophageal ulcer completely healed   ESOPHAGOGASTRODUODENOSCOPY N/A 03/30/2020   Buccini: Moderately severe erosive, circumferential, confluent esophagitis with no bleeding found 25 to 40 cm from incisors.  Nonobstructing and mild Schatzki ring, there were also multiple distal esophageal rings noted, minimal hiatal hernia.   ESOPHAGOGASTRODUODENOSCOPY (EGD) WITH PROPOFOL  N/A 08/21/2020   Procedure: ESOPHAGOGASTRODUODENOSCOPY (EGD) WITH PROPOFOL ;  Surgeon: Cindie Carlin POUR, DO;  [facility];  Service: Endoscopy;  Laterality: N/A;  2:15pm   IR IMAGING GUIDED PORT INSERTION  03/13/2020   IR REMOVAL TUN ACCESS W/ PORT W/O FL MOD SED  10/01/2021   LYMPH NODE BIOPSY Left 03/20/2020   Procedure: LEFT POSTERIOR CERVICAL LYMPH NODE BIOPSY;  Surgeon: Sebastian Moles, MD;  Location[facility] OR;  Service: General;  Laterality: Left;   MALONEY DILATION N/A 03/30/2015   Procedure: AGAPITO HODGKIN;  Surgeon: Charleston CHRISTELLA Shaaron, MD;  [facility];  Service: Endoscopy;  Laterality: N/A;   POLYPECTOMY  05/02/2020   Procedure:  POLYPECTOMY;  Surgeon: Eartha Flavors Toribio, MD;  [facility];  Service: Gastroenterology;;     A IV Location/Drains/Wounds Patient Lines/Drains/Airways Status     Active Line/Drains/Airways     Name Placement date Placement time Site Days   Peripheral IV 05/21/24 22 G Left;Posterior Hand 05/21/24  1202  Hand  less than 1   Peripheral IV 05/21/24 20 G 1 Anterior;Proximal;Right Forearm 05/21/24  1249  Forearm  less than 1   Incision - 4 Ports Umbilicus Left;Lateral;Mid Upper;Lateral;Left Medial;Upper 04/13/17  9052  -- 2595            Intake/Output Last 24 hours  Intake/Output Summary (Last 24 hours) at 05/21/2024 2200 Last data filed at 05/21/2024 2051 Gross per 24 hour  Intake 2100 ml  Output 1000 ml  Net 1100 ml    Labs/Imaging Results for orders placed or performed during the hospital encounter of 05/21/24 (from the past 48 hours)  Urinalysis, w/ Reflex to Culture (Infection Suspected) -Urine, Clean Catch     Status: Abnormal   Collection Time: 05/21/24 12:41 PM  Result Value Ref Range   Specimen Source URINE, CLEAN CATCH  Color, Urine YELLOW YELLOW   APPearance CLOUDY (A) CLEAR   Specific Gravity, Urine 1.005 1.005 - 1.030   pH 8.0 5.0 - 8.0   Glucose, UA 50 (A) NEGATIVE mg/dL   Hgb urine dipstick SMALL (A) NEGATIVE   Bilirubin Urine NEGATIVE NEGATIVE   Ketones, ur NEGATIVE NEGATIVE mg/dL   Protein, ur NEGATIVE NEGATIVE mg/dL   Nitrite NEGATIVE NEGATIVE   Leukocytes,Ua LARGE (A) NEGATIVE   RBC / HPF 11-20 0 - 5 RBC/hpf   WBC, UA 11-20 0 - 5 WBC/hpf    Comment:        Reflex urine culture not performed if WBC <=10, OR if Squamous epithelial cells >5. If Squamous epithelial cells >5 suggest recollection.    Bacteria, UA MANY (A) NONE SEEN   Squamous Epithelial / HPF 0-5 0 - 5 /HPF   Non Squamous Epithelial 0-5 (A) NONE SEEN    Comment: Performed at [facility], [address] [place], [place] [address]  Comprehensive  metabolic panel     Status: Abnormal   Collection Time: 05/21/24 12:49 PM  Result Value Ref Range   Sodium 138 135 - 145 mmol/L   Potassium 3.7 3.5 - 5.1 mmol/L   Chloride 99 98 - 111 mmol/L   CO2 20 (L) 22 - 32 mmol/L   Glucose, Bld 219 (H) 70 - 99 mg/dL    Comment: Glucose reference range applies only to samples taken after fasting for at least 8 hours.   BUN 22 8 - 23 mg/dL   Creatinine, Ser 8.64 (H) 0.44 - 1.00 mg/dL   Calcium  10.4 (H) 8.9 - 10.3 mg/dL   Total Protein 7.2 6.5 - 8.1 g/dL   Albumin 4.6 3.5 - 5.0 g/dL   AST 43 (H) 15 - 41 U/L   ALT 56 (H) 0 - 44 U/L   Alkaline Phosphatase 73 38 - 126 U/L   Total Bilirubin 0.7 0.0 - 1.2 mg/dL   GFR, Estimated 39 (L) >60 mL/min    Comment: (NOTE) Calculated using the CKD-EPI Creatinine Equation (2021)    Anion gap 20 (H) 5 - 15    Comment: Performed at [facility], [address] [place], [place] [address]  Lipase, blood     Status: Abnormal   Collection Time: 05/21/24 12:49 PM  Result Value Ref Range   Lipase 99 (H) 11 - 51 U/L    Comment: Performed at [facility], [address], [place], [place] [address]  CBC with Differential     Status: Abnormal   Collection Time: 05/21/24 12:49 PM  Result Value Ref Range   WBC 10.9 (H) 4.0 - 10.5 K/uL   RBC 4.91 3.87 - 5.11 MIL/uL   Hemoglobin 14.8 12.0 - 15.0 g/dL   HCT 57.7 63.9 - 53.9 %   MCV 85.9 80.0 - 100.0 fL   MCH 30.1 26.0 - 34.0 pg   MCHC 35.1 30.0 - 36.0 g/dL   RDW 86.7 88.4 - 84.4 %   Platelets 264 150 - 400 K/uL   nRBC 0.0 0.0 - 0.2 %   Neutrophils Relative % 69 %   Neutro Abs 7.6 1.7 - 7.7 K/uL   Lymphocytes Relative 21 %   Lymphs Abs 2.3 0.7 - 4.0 K/uL   Monocytes Relative 6 %   Monocytes Absolute 0.6 0.1 - 1.0 K/uL   Eosinophils Relative 2 %   Eosinophils Absolute 0.2 0.0 - 0.5 K/uL   Basophils Relative 1 %  Basophils Absolute 0.1 0.0 - 0.1 K/uL   Immature Granulocytes 1 %   Abs Immature Granulocytes 0.06 0.00 - 0.07  K/uL    Comment: Performed at [facility], [address], [place], [place] [address]  Troponin T, High Sensitivity     Status: None   Collection Time: 05/21/24 12:49 PM  Result Value Ref Range   Troponin T High Sensitivity <15 0 - 19 ng/L    Comment: (NOTE) Biotin concentrations > 1000 ng/mL falsely decrease TnT results.  Serial cardiac troponin measurements are suggested.  Refer to the Links section for chest pain algorithms and additional  guidance. Performed at [facility], [address], [place], [place] [address]   Vitamin B12     Status: None   Collection Time: 05/21/24 12:50 PM  Result Value Ref Range   Vitamin B-12 777 180 - 914 pg/mL    Comment: Performed at [facility], [address], [place], [place] [address]  I-Stat CG4 Lactic Acid     Status: Abnormal   Collection Time: 05/21/24  1:03 PM  Result Value Ref Range   Lactic Acid, Venous 4.0 (HH) 0.5 - 1.9 mmol/L   Comment NOTIFIED PHYSICIAN   Protime-INR     Status: None   Collection Time: 05/21/24  1:06 PM  Result Value Ref Range   Prothrombin Time 13.2 11.4 - 15.2 seconds   INR 0.9 0.8 - 1.2    Comment: (NOTE) INR goal varies based on device and disease states. Performed at [facility], [address] [place], [place] [address]   Resp panel by RT-PCR (RSV, Flu A&B, Covid) Anterior Nasal Swab     Status: None   Collection Time: 05/21/24  1:34 PM   Specimen: Anterior Nasal Swab  Result Value Ref Range   SARS Coronavirus 2 by RT PCR NEGATIVE NEGATIVE    Comment: (NOTE) SARS-CoV-2 target nucleic acids are NOT DETECTED.  The SARS-CoV-2 RNA is generally detectable in upper respiratory specimens during the acute phase of infection. The lowest concentration of SARS-CoV-2 viral copies this assay can detect is 138 copies/mL. A negative result does not preclude SARS-Cov-2 infection and should not be used as the sole basis for treatment  or other patient management decisions. A negative result may occur with  improper specimen collection/handling, submission of specimen other than nasopharyngeal swab, presence of viral mutation(s) within the areas targeted by this assay, and inadequate number of viral copies(<138 copies/mL). A negative result must be combined with clinical observations, patient history, and epidemiological information. The expected result is Negative.  Fact Sheet for Patients:  BloggerCourse.com  Fact Sheet for Healthcare Providers:  SeriousBroker.it  This test is no t yet approved or cleared by the [place]  FDA and  has been authorized for detection and/or diagnosis of SARS-CoV-2 by FDA under an Emergency Use Authorization (EUA). This EUA will remain  in effect (meaning this test can be used) for the duration of the COVID-19 declaration under Section 564(b)(1) of the Act, 21 U.S.C.section 360bbb-3(b)(1), unless the authorization is terminated  or revoked sooner.       Influenza A by PCR NEGATIVE NEGATIVE   Influenza B by PCR NEGATIVE NEGATIVE    Comment: (NOTE) The Xpert Xpress SARS-CoV-2/FLU/RSV plus assay is intended as an aid in the diagnosis of influenza from Nasopharyngeal swab specimens and should not be used as a sole basis for treatment. Nasal washings and aspirates are unacceptable for Xpert Xpress SARS-CoV-2/FLU/RSV testing.  Fact Sheet for Patients: BloggerCourse.com  Fact Sheet for Healthcare Providers: SeriousBroker.it  This test is not yet approved or cleared by the [place]  FDA and has been authorized for detection and/or diagnosis of SARS-CoV-2 by FDA under an Emergency Use Authorization (EUA). This EUA will remain in effect (meaning this test can be used) for the duration of the COVID-19 declaration under Section 564(b)(1) of the Act, 21 U.S.C. section  360bbb-3(b)(1), unless the authorization is terminated or revoked.     Resp Syncytial Virus by PCR NEGATIVE NEGATIVE    Comment: (NOTE) Fact Sheet for Patients: BloggerCourse.com  Fact Sheet for Healthcare Providers: SeriousBroker.it  This test is not yet approved or cleared by the [place]  FDA and has been authorized for detection and/or diagnosis of SARS-CoV-2 by FDA under an Emergency Use Authorization (EUA). This EUA will remain in effect (meaning this test can be used) for the duration of the COVID-19 declaration under Section 564(b)(1) of the Act, 21 U.S.C. section 360bbb-3(b)(1), unless the authorization is terminated or revoked.  Performed at [facility], [address] [place], [place] [address]   Troponin T, High Sensitivity     Status: None   Collection Time: 05/21/24  3:39 PM  Result Value Ref Range   Troponin T High Sensitivity <15 0 - 19 ng/L    Comment: (NOTE) Biotin concentrations > 1000 ng/mL falsely decrease TnT results.  Serial cardiac troponin measurements are suggested.  Refer to the Links section for chest pain algorithms and additional  guidance. Performed at [facility], [address] [place], [place] [address]   Procalcitonin     Status: None   Collection Time: 05/21/24  3:39 PM  Result Value Ref Range   Procalcitonin <0.10 ng/mL    Comment: (NOTE)   Sepsis PCT Algorithm          Lower Respiratory Tract Infection                                         PCT Algorithm -----------------------------------------------------------------  <0.5 ng/mL                    <0.10 ng/mL  Associated with low           Antibiotic therapy strongly   risk for progression          discouraged. Indicates absence   to severe sepsis              of bacteria infection  and/or septic shock             --------------------------------------------------------------   0.5-2.0 ng/mL                 0.10-0.25 ng/mL  Recommended to retest         Antibiotic therapy discouraged.  PCT within 6-24 hours         Bacterial infection unlikely  ------------------------------------------------------------  >2 ng/mL                      0.26-0.50 ng/mL  Associated with high risk     Antibiotic therapy encouraged.  for progression to severe     Bacterial infection possible  sepsis/and or septic shock    ------------------------------                                 >  0.50 ng/mL                                Antibiotic therapy strongly                                 encouraged.                                Suggestive of presence of                                 bacterial infection.                                 -------------------------------------------------------------------  < or = 0.50 ng/mL OR          < or = 0.25 OR 80% decrease in PCT  80% decrease in PCT           Antibiotic therapy   Antibiotic therapy may        may be discontinued  be discontinued                                 Performed at [facility], [address], [place], [place] [address]   Magnesium      Status: None   Collection Time: 05/21/24  3:39 PM  Result Value Ref Range   Magnesium  1.9 1.7 - 2.4 mg/dL    Comment: Performed at [facility], [address] [place], [place] [address]  I-Stat Lactic Acid, ED     Status: Abnormal   Collection Time: 05/21/24  3:45 PM  Result Value Ref Range   Lactic Acid, Venous 4.8 (HH) 0.5 - 1.9 mmol/L   Comment NOTIFIED PHYSICIAN   I-Stat Lactic Acid, ED     Status: Abnormal   Collection Time: 05/21/24  5:32 PM  Result Value Ref Range   Lactic Acid, Venous 4.0 (HH) 0.5 - 1.9 mmol/L   Comment NOTIFIED PHYSICIAN   Lactic acid, plasma     Status: Abnormal   Collection Time: 05/21/24  7:51 PM  Result Value Ref Range   Lactic Acid, Venous 2.9 (HH) 0.5 - 1.9 mmol/L    Comment: Critical Value, Read Back  and verified with Ezzard HERO RN @ [phone] 05/21/24 CAL Performed at [facility], [address] [place], [place] [address]    CT CERVICAL SPINE WO CONTRAST Result Date: 05/21/2024 CLINICAL DATA:  Syncope. EXAM: CT CERVICAL SPINE WITHOUT CONTRAST TECHNIQUE: Multidetector CT imaging of the cervical spine was performed without intravenous contrast. Multiplanar CT image reconstructions were also generated. RADIATION DOSE REDUCTION: This exam was performed according to the departmental dose-optimization program which includes automated exposure control, adjustment of the mA and/or kV according to patient size and/or use of iterative reconstruction technique. COMPARISON:  None Available. FINDINGS: Alignment: Normal. Skull base and vertebrae: No acute fracture. No primary bone lesion or focal pathologic process. Soft tissues and spinal canal: No prevertebral fluid or swelling. No visible canal hematoma. Disc levels: Moderate severity endplate sclerosis is seen at the level of C5-C6. Very mild endplate sclerosis is  present throughout the remainder of the cervical spine. Moderate to marked severity intervertebral disc space narrowing is also noted at the level of C5-C6 with mild multilevel intervertebral disc space narrowing throughout the remainder of the cervical spine. Mild, bilateral multilevel facet joint hypertrophy is noted. Upper chest: Negative. Other: None. IMPRESSION: 1. No acute fracture or subluxation in the cervical spine. 2. 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 Electronically Signed   By: Suzen Dials M.D.   On: 05/21/2024 18:30   CT HEAD WO CONTRAST ( ) Result Date: 05/21/2024 CLINICAL DATA:  Syncopal episode. EXAM: CT HEAD WITHOUT CONTRAST TECHNIQUE: Contiguous axial images were obtained from the base of the skull through the vertex without intravenous contrast. RADIATION DOSE REDUCTION: This exam was performed according to the departmental  dose-optimization program which includes automated exposure control, adjustment of the mA and/or kV according to patient size and/or use of iterative reconstruction technique. COMPARISON:  Jan 30, 2021 FINDINGS: Brain: There is generalized cerebral atrophy with widening of the extra-axial spaces and ventricular dilatation. There are areas of decreased attenuation within the white matter tracts of the supratentorial brain, consistent with microvascular disease changes. Vascular: Marked severity bilateral cavernous carotid artery calcification is noted. Skull: Normal. Negative for fracture or focal lesion. Sinuses/Orbits: No acute finding. Other: None. IMPRESSION: 1. Generalized cerebral atrophy and microvascular disease changes of the supratentorial brain. 2. No acute intracranial abnormality. Electronically Signed   By: Suzen Dials M.D.   On: 05/21/2024 18:27   DG Cervical Spine 2 or 3 views Result Date: 05/21/2024 CLINICAL DATA:  Neck pain after fall. EXAM: CERVICAL SPINE - 2-3 VIEW COMPARISON:  None Available. FINDINGS: Only the first 5 cervical vertebra are adequately visualized. No fracture or spondylolisthesis is seen involving the visualized vertebra. Pathology involving C6 and C7 vertebra cannot be excluded on the basis of this exam. IMPRESSION: Only the first 5 cervical vertebra are adequately visualized. No fracture or spondylolisthesis is seen involving the visualized vertebra. Pathology involving C6 and C7 vertebra cannot be excluded on the basis of this exam. CT scan is recommended for further evaluation. Electronically Signed   By: Lynwood Landy Raddle M.D.   On: 05/21/2024 17:17   DG Shoulder Left Result Date: 05/21/2024 CLINICAL DATA:  Left shoulder pain after fall. EXAM: LEFT SHOULDER - 2+ VIEW COMPARISON:  None Available. FINDINGS: There is no evidence of fracture or dislocation. There is no evidence of arthropathy or other focal bone abnormality. Soft tissues are unremarkable. IMPRESSION:  Negative. Electronically Signed   By: Lynwood Landy Raddle M.D.   On: 05/21/2024 17:15   CT CHEST ABDOMEN PELVIS W CONTRAST Result Date: 05/21/2024 CLINICAL DATA:  Witnessed syncopal episode. Two-week of vomiting and diarrhea EXAM: CT CHEST, ABDOMEN, AND PELVIS WITH CONTRAST TECHNIQUE: Multidetector CT imaging of the chest, abdomen and pelvis was performed following the standard protocol during bolus administration of intravenous contrast. RADIATION DOSE REDUCTION: This exam was performed according to the departmental dose-optimization program which includes automated exposure control, adjustment of the mA and/or kV according to patient size and/or use of iterative reconstruction technique. CONTRAST:  80mL OMNIPAQUE  IOHEXOL  300 MG/ML  SOLN COMPARISON:  CT chest, abdomen, and pelvis dated 05/17/2023 FINDINGS: CT CHEST FINDINGS Cardiovascular: Normal heart size. No significant pericardial fluid/thickening. Great vessels are normal in course and caliber. No central pulmonary emboli. Coronary artery calcifications. Mediastinum/Nodes: Imaged thyroid  gland without nodules meeting criteria for imaging follow-up by size. Normal esophagus. No pathologically enlarged axillary, supraclavicular, mediastinal,  or hilar lymph nodes. Lungs/Pleura: The central airways are patent. No focal consolidation. No pneumothorax. No pleural effusion. Musculoskeletal: No acute or abnormal lytic or blastic osseous lesions. Multilevel degenerative changes of the thoracic spine. CT ABDOMEN PELVIS FINDINGS Hepatobiliary: No focal hepatic lesions. No intra or extrahepatic biliary ductal dilation. Cholecystectomy. Pancreas: No focal lesions or main ductal dilation. Spleen: Normal in size without focal abnormality. Adrenals/Urinary Tract: No adrenal nodules. No suspicious renal mass, calculi, or hydronephrosis. Unchanged exophytic right interpolar 1.5 cm simple cyst (2:61). No specific follow-up imaging recommended. No focal bladder wall thickening.  Stomach/Bowel: Normal appearance of the stomach. No evidence of bowel wall thickening, distention, or inflammatory changes. Colonic diverticulosis without acute diverticulitis. Normal appendix. Vascular/Lymphatic: Aortic atherosclerosis. No enlarged abdominal or pelvic lymph nodes. Reproductive: No adnexal masses. Other: No free fluid, fluid collection, or free air. Musculoskeletal: No acute or abnormal lytic or blastic osseous findings. Multilevel degenerative changes of the lumbar spine. IMPRESSION: 1. No acute abnormality in the chest, abdomen, or pelvis. 2. Colonic diverticulosis without acute diverticulitis. 3. Aortic Atherosclerosis (ICD10-I70.0). Coronary artery calcifications. Assessment for potential risk factor modification, dietary therapy or pharmacologic therapy may be warranted, if clinically indicated. Electronically Signed   By: Limin  Xu M.D.   On: 05/21/2024 14:34   DG Chest Port 1 View Result Date: 05/21/2024 CLINICAL DATA:  Syncope. EXAM: PORTABLE CHEST 1 VIEW COMPARISON:  05/01/2024. FINDINGS: Trachea is midline. Heart is mildly enlarged, stable. Thoracic aorta is calcified. Lungs are low in volume with minimal bibasilar atelectasis. No pleural fluid. IMPRESSION: Low lung volumes.  No acute findings. Electronically Signed   By: Newell Eke M.D.   On: 05/21/2024 14:03    Pending Labs Unresulted Labs (From admission, onward)     Start     Ordered   05/22/24 0500  Comprehensive metabolic panel  Tomorrow morning,   R        05/21/24 1629   05/22/24 0500  CBC  Tomorrow morning,   R        05/21/24 1629   05/22/24 0500  Lipase, blood  Tomorrow morning,   R        05/21/24 1633   05/21/24 1911  Ferritin  Once,   R        05/21/24 1910   05/21/24 1800  Gastrointestinal Panel by PCR , Stool  (Gastrointestinal Panel by PCR, Stool                                                                                                                                                     **Does  Not include CLOSTRIDIUM DIFFICILE testing. **If CDIFF testing is needed, place order from the C Difficile Testing order set.**)  Once,   R        05/21/24 1759   05/21/24 1800  C Difficile Quick  Screen w PCR reflex  (C Difficile quick screen w PCR reflex panel )  Once, for 24 hours,   TIMED       References:    CDiff Information Tool   05/21/24 1759   05/21/24 1620  Lactic acid, plasma  (Lactic Acid)  STAT Now then every 3 hours,   R      05/21/24 1619   05/21/24 1536  Procalcitonin  Daily,   R     References:    Procalcitonin Lower Respiratory Tract Infection AND Sepsis Procalcitonin Algorithm   05/21/24 1535   05/21/24 1306  Blood Culture (routine x 2)  (Septic presentation on arrival (screening labs, nursing and treatment orders for obvious sepsis))  BLOOD CULTURE X 2,   STAT      05/21/24 1309   05/21/24 1241  Urine Culture  Once,   R        05/21/24 1241            Vitals/Pain Today's Vitals   05/21/24 1910 05/21/24 2013 05/21/24 2030 05/21/24 2100  BP:   (!) 141/75 (!) 153/90  Pulse:   77 76  Resp:   18 15  Temp:  97.6 F (36.4 C)    TempSrc:  Oral    SpO2: 97%  100% 98%    Isolation Precautions Enteric precautions (UV disinfection)  Medications Medications  lactated ringers  infusion (0 mLs Intravenous Stopped 05/21/24 1845)  cefTRIAXone  (ROCEPHIN ) 1 g in sodium chloride  0.9 % 100 mL IVPB (has no administration in time range)  acetaminophen  (TYLENOL ) tablet 650 mg (has no administration in time range)    Or  acetaminophen  (TYLENOL ) suppository 650 mg (has no administration in time range)  ondansetron  (ZOFRAN ) tablet 4 mg (has no administration in time range)    Or  ondansetron  (ZOFRAN ) injection 4 mg (has no administration in time range)  lidocaine  (LIDODERM ) 5 % 1 patch (1 patch Transdermal Patch Applied 05/21/24 1935)  HYDROcodone -acetaminophen  (NORCO/VICODIN) 5-325 MG per tablet 1 tablet (has no administration in time range)  enoxaparin  (LOVENOX ) injection 30 mg  (has no administration in time range)  metoprolol  tartrate (LOPRESSOR ) injection 5 mg (has no administration in time range)  levothyroxine  (SYNTHROID ) tablet 75 mcg (has no administration in time range)  pantoprazole  (PROTONIX ) EC tablet 40 mg (has no administration in time range)  donepezil  (ARICEPT ) tablet 10 mg (has no administration in time range)  mirtazapine  (REMERON ) tablet 15 mg (has no administration in time range)  rosuvastatin  (CRESTOR ) tablet 10 mg (has no administration in time range)  amLODipine  (NORVASC ) tablet 10 mg (has no administration in time range)  rOPINIRole  (REQUIP ) tablet 0.25 mg (has no administration in time range)  trimethobenzamide  (TIGAN ) injection 200 mg (200 mg Intramuscular Given 05/21/24 1329)  lactated ringers  bolus 1,000 mL (0 mLs Intravenous Stopped 05/21/24 1353)    And  lactated ringers  bolus 1,000 mL (0 mLs Intravenous Stopped 05/21/24 1526)  ceFEPIme  (MAXIPIME ) 2 g in sodium chloride  0.9 % 100 mL IVPB (0 g Intravenous Stopped 05/21/24 1352)  metroNIDAZOLE  (FLAGYL ) IVPB 500 mg (0 mg Intravenous Stopped 05/21/24 1527)  vancomycin  (VANCOCIN ) IVPB 1000 mg/200 mL premix (0 mg Intravenous Stopped 05/21/24 1527)  iohexol  (OMNIPAQUE ) 300 MG/ML solution 80 mL (80 mLs Intravenous Contrast Given 05/21/24 1410)    Mobility walks     Focused Assessments See Chart   R Recommendations: See Admitting Provider Note  Report given to:   Additional Notes:  Patient was initially oriented to self, time, and place.  Patients mentation improved. Patient now GCS of 15 A&O x4

## 2024-05-21 NOTE — ED Provider Notes (Signed)
 [place] EMERGENCY DEPARTMENT AT [facility] Provider Note   CSN: [id number] Arrival date & time: 05/21/24  1151     Patient presents with: Loss of Consciousness   Elizabeth Mcintyre is a 83 y.o. female has medical history significant for hypertension, CKD, dementia, GI bleeding, diabetes, and asthma presents today for a syncopal episode where she was assisted to the ground.  Patient has been having vomiting and diarrhea for 2 weeks.  Patient also reporting shortness of breath.    Loss of Consciousness Associated symptoms: shortness of breath        Prior to Admission medications   Medication Sig Start Date End Date Taking? Authorizing Provider  acetaminophen  (TYLENOL ) 325 MG tablet Take 325 mg by mouth every 6 (six) hours as needed for moderate pain.     [provider]  albuterol  (VENTOLIN  HFA) 108 (90 Base) MCG/ACT inhaler Inhale 2 puffs into the lungs every 6 (six) hours as needed. 05/08/23   [provider]  amLODipine  (NORVASC ) 10 MG tablet Take 1 tablet (10 mg total) by mouth daily. 05/10/24   [name], MD  Ascorbic Acid (VITAMIN C ) 100 MG tablet Take 100 mg by mouth daily.    [provider]  carvedilol  (COREG ) 3.125 MG tablet Take 1 tablet (3.125 mg total) by mouth 2 (two) times daily with a meal. 05/10/24   [name], MD  cetirizine (ZYRTEC) 10 MG tablet Take 10 mg by mouth at bedtime. As needed    [provider]  Cholecalciferol  (VITAMIN D3) 25 MCG (1000 UT) CAPS Take 1,000 Units by mouth daily.     [provider]  donepezil  (ARICEPT ) 10 MG tablet Take 1 tablet (10 mg total) by mouth daily. 05/10/24   [name], MD  levothyroxine  (SYNTHROID ) 75 MCG tablet Take 1 tablet (75 mcg total) by mouth daily. 05/10/24   [name], MD  mirtazapine  (REMERON ) 15 MG tablet Take 1 tablet (15 mg total) by mouth at bedtime. 05/10/24   [name], MD  pantoprazole  (PROTONIX ) 40 MG tablet Take 1 tablet (40 mg  total) by mouth daily. 05/10/24   [name], MD  rOPINIRole  (REQUIP ) 0.25 MG tablet Take 0.25 mg by mouth at bedtime. 01/30/23   [provider]  rosuvastatin  (CRESTOR ) 10 MG tablet Take 1 tablet (10 mg total) by mouth daily. 05/10/24   [name], MD  sucralfate  (CARAFATE ) 1 g tablet TAKE 1 TABLET BY MOUTH 4 TIMES DAILY. 09/28/23   [name], MD  vitamin B-12 (CYANOCOBALAMIN ) 1000 MCG tablet Take 1,000 mcg by mouth daily.     [provider]    Allergies: Codeine and Codeine phosphate    Review of Systems  Respiratory:  Positive for shortness of breath.   Cardiovascular:  Positive for syncope.  Neurological:  Positive for syncope.    Updated Vital Signs BP (!) 158/71   Pulse 75   Temp (!) 97.4 F (36.3 C) (Oral)   Resp 16   SpO2 100%   Physical Exam Vitals and nursing note reviewed.  Constitutional:      General: She is not in acute distress.    Appearance: She is well-developed. She is ill-appearing.  HENT:     Head: Normocephalic and atraumatic.     Right Ear: External ear normal.     Left Ear: External ear normal.  Eyes:     Extraocular Movements: Extraocular movements intact.     Conjunctiva/sclera: Conjunctivae  normal.  Cardiovascular:     Rate and Rhythm: Normal rate and regular rhythm.     Pulses: Normal pulses.     Heart sounds: Normal heart sounds. No murmur heard. Pulmonary:     Effort: Pulmonary effort is normal. No respiratory distress.     Breath sounds: Normal breath sounds.  Abdominal:     General: There is no distension.     Palpations: Abdomen is soft.     Tenderness: There is no abdominal tenderness.  Musculoskeletal:        General: No swelling.     Cervical back: Neck supple.  Skin:    General: Skin is warm and dry.     Capillary Refill: Capillary refill takes less than 2 seconds.  Neurological:     Mental Status: She is alert and oriented to person, place, and time.  Psychiatric:        Mood and Affect: Mood  normal.     (all labs ordered are listed, but only abnormal results are displayed) Labs Reviewed  COMPREHENSIVE METABOLIC PANEL WITH GFR - Abnormal; Notable for the following components:      Result Value   CO2 20 (*)    Glucose, Bld 219 (*)    Creatinine, Ser 1.35 (*)    Calcium  10.4 (*)    AST 43 (*)    ALT 56 (*)    GFR, Estimated 39 (*)    Anion gap 20 (*)    All other components within normal limits  LIPASE, BLOOD - Abnormal; Notable for the following components:   Lipase 99 (*)    All other components within normal limits  CBC WITH DIFFERENTIAL/PLATELET - Abnormal; Notable for the following components:   WBC 10.9 (*)    All other components within normal limits  URINALYSIS, W/ REFLEX TO CULTURE (INFECTION SUSPECTED) - Abnormal; Notable for the following components:   APPearance CLOUDY (*)    Glucose, UA 50 (*)    Hgb urine dipstick SMALL (*)    Leukocytes,Ua LARGE (*)    Bacteria, UA MANY (*)    Non Squamous Epithelial 0-5 (*)    All other components within normal limits  I-STAT CG4 LACTIC ACID, ED - Abnormal; Notable for the following components:   Lactic Acid, Venous 4.0 (*)    All other components within normal limits  RESP PANEL BY RT-PCR (RSV, FLU A&B, COVID)  [id number]  CULTURE, BLOOD (ROUTINE X 2)  CULTURE, BLOOD (ROUTINE X 2)  URINE CULTURE  PROTIME-INR  I-STAT CG4 LACTIC ACID, ED  I-STAT CG4 LACTIC ACID, ED  TROPONIN T, HIGH SENSITIVITY  TROPONIN T, HIGH SENSITIVITY    EKG: None  Radiology: CT CHEST ABDOMEN PELVIS W CONTRAST Result Date: 05/21/2024 CLINICAL DATA:  Witnessed syncopal episode. Two-week of vomiting and diarrhea EXAM: CT CHEST, ABDOMEN, AND PELVIS WITH CONTRAST TECHNIQUE: Multidetector CT imaging of the chest, abdomen and pelvis was performed following the standard protocol during bolus administration of intravenous contrast. RADIATION DOSE REDUCTION: This exam was performed according to the departmental dose-optimization program which  includes automated exposure control, adjustment of the mA and/or kV according to patient size and/or use of iterative reconstruction technique. CONTRAST:  80mL OMNIPAQUE  IOHEXOL  300 MG/ML  SOLN COMPARISON:  CT chest, abdomen, and pelvis dated 05/17/2023 FINDINGS: CT CHEST FINDINGS Cardiovascular: Normal heart size. No significant pericardial fluid/thickening. Great vessels are normal in course and caliber. No central pulmonary emboli. Coronary artery calcifications. Mediastinum/Nodes: Imaged thyroid  gland without nodules meeting criteria for imaging follow-up  by size. Normal esophagus. No pathologically enlarged axillary, supraclavicular, mediastinal, or hilar lymph nodes. Lungs/Pleura: The central airways are patent. No focal consolidation. No pneumothorax. No pleural effusion. Musculoskeletal: No acute or abnormal lytic or blastic osseous lesions. Multilevel degenerative changes of the thoracic spine. CT ABDOMEN PELVIS FINDINGS Hepatobiliary: No focal hepatic lesions. No intra or extrahepatic biliary ductal dilation. Cholecystectomy. Pancreas: No focal lesions or main ductal dilation. Spleen: Normal in size without focal abnormality. Adrenals/Urinary Tract: No adrenal nodules. No suspicious renal mass, calculi, or hydronephrosis. Unchanged exophytic right interpolar 1.5 cm simple cyst (2:61). No specific follow-up imaging recommended. No focal bladder wall thickening. Stomach/Bowel: Normal appearance of the stomach. No evidence of bowel wall thickening, distention, or inflammatory changes. Colonic diverticulosis without acute diverticulitis. Normal appendix. Vascular/Lymphatic: Aortic atherosclerosis. No enlarged abdominal or pelvic lymph nodes. Reproductive: No adnexal masses. Other: No free fluid, fluid collection, or free air. Musculoskeletal: No acute or abnormal lytic or blastic osseous findings. Multilevel degenerative changes of the lumbar spine. IMPRESSION: 1. No acute abnormality in the chest, abdomen, or  pelvis. 2. Colonic diverticulosis without acute diverticulitis. 3. Aortic Atherosclerosis (ICD10-I70.0). Coronary artery calcifications. Assessment for potential risk factor modification, dietary therapy or pharmacologic therapy may be warranted, if clinically indicated. Electronically Signed   By: Limin  Xu M.D.   On: 05/21/2024 14:34   DG Chest Port 1 View Result Date: 05/21/2024 CLINICAL DATA:  Syncope. EXAM: PORTABLE CHEST 1 VIEW COMPARISON:  05/01/2024. FINDINGS: Trachea is midline. Heart is mildly enlarged, stable. Thoracic aorta is calcified. Lungs are low in volume with minimal bibasilar atelectasis. No pleural fluid. IMPRESSION: Low lung volumes.  No acute findings. Electronically Signed   By: Newell Eke M.D.   On: 05/21/2024 14:03     Procedures   Medications Ordered in the ED  lactated ringers  infusion ( Intravenous New Bag/Given 05/21/24 1324)  lactated ringers  bolus 1,000 mL (1,000 mLs Intravenous New Bag/Given 05/21/24 1323)    And  lactated ringers  bolus 1,000 mL (1,000 mLs Intravenous New Bag/Given 05/21/24 1456)  vancomycin  (VANCOCIN ) IVPB 1000 mg/200 mL premix (1,000 mg Intravenous New Bag/Given 05/21/24 1426)  fentaNYL  (SUBLIMAZE ) injection 50 mcg (has no administration in time range)  trimethobenzamide  (TIGAN ) injection 200 mg (200 mg Intramuscular Given 05/21/24 1329)  ceFEPIme  (MAXIPIME ) 2 g in sodium chloride  0.9 % 100 mL IVPB (0 g Intravenous Stopped 05/21/24 1352)  metroNIDAZOLE  (FLAGYL ) IVPB 500 mg (500 mg Intravenous New Bag/Given 05/21/24 1350)  iohexol  (OMNIPAQUE ) 300 MG/ML solution 80 mL (80 mLs Intravenous Contrast Given 05/21/24 1410)    Clinical Course as of 05/21/24 1503  Sat May 21, 2024  1502 UTI. Code Sepsis. Got broad spectrum abx. Mildly elevated lipase. Negative CT. Hx of dementia with waxing and waning a/o status. Family at bedside.  [CB]    Clinical Course User Index [CB] Beola Terrall RAMAN, PA-C                                 Medical Decision Making Amount  and/or Complexity of Data Reviewed Labs: ordered. Radiology: ordered.  Risk Prescription drug management.   This patient presents to the ED for concern of syncope, this involves an extensive number of treatment options, and is a complaint that carries with it a high risk of complications and morbidity.  The differential diagnosis includes electrolyte abnormality, sepsis, UTI, pneumonia, COVID, flu, RSV, dehydration   Additional history obtained:  Additional history obtained from EMR External  records from outside source obtained and reviewed including admission documents   Lab Tests:  I Ordered, and personally interpreted labs.  The pertinent results include: Lactic acid 4.0, leukocytosis at 10.9 without left shift, decreased CO2 at 20, elevated glucose at 219, elevated creatinine at 1.35 which is around patient's baseline, elevated anion gap at 20, lipase 99, AST 43, ALT 56, pro time and INR WNL, UA with small hemoglobin, 50 glucose, large leukocytes, many bacteria, respiratory panel negative, troponin less than 15   Imaging Studies ordered:  I ordered imaging studies including chest x-ray I independently visualized and interpreted imaging which showed low lung volumes, no acute findings I agree with the radiologist interpretation CT chest abdomen pelvis which showed no acute findings   Cardiac Monitoring: / EKG:  The patient was maintained on a cardiac monitor.  I personally viewed and interpreted the cardiac monitored which showed an underlying rhythm of: Sinus rhythm, RBBB   Problem List / ED Course / Critical interventions / Medication management I ordered medication including IVF and broad-spectrum antibiotics I have reviewed the patients home medicines and have made adjustments as needed  Patient signed out to Terrall First, PA-C pending hospitalist admission.  Please refer to their note for full ED course.     Final diagnoses:  Sepsis, due to unspecified organism,  unspecified whether acute organ dysfunction present [facility])  Urinary tract infection with hematuria, site unspecified    ED Discharge Orders     None          Francis Ileana SAILOR, PA-C 05/21/24 1503    Tegeler, Lonni PARAS, MD 05/21/24 [phone]

## 2024-05-21 NOTE — Assessment & Plan Note (Signed)
 83 year old presenting with syncope and fall found to meet severe sepsis criteria with temp of 96, RR of 28 and lactic acid of 4.8 with urine suspicious for infection  -admit to progressive -given appropriate IVF in ED, continue overnight  -trend lactic acid -CXR negative, pan cultured -give broad spectrum abx in ED, narrow to rocephin   -trend PCT -follow cultures  -temp improved with blankets, continue to monitor temperature

## 2024-05-21 NOTE — Sepsis Progress Note (Signed)
 Sepsis protocol is being followed by [facility].

## 2024-05-21 NOTE — ED Notes (Signed)
 I made Nurse aware of critical I stat Lactic Acid

## 2024-05-21 NOTE — ED Notes (Signed)
 I made paramedic aware of critical I Stat Lactic Acid

## 2024-05-21 NOTE — Assessment & Plan Note (Addendum)
 Witnessed syncope with fall with family. Per one family member, son caught her She does not recall this at all Hx of poor PO intake with diarrhea, ? Orthostatic etiology  CT head with no acute finding, no neuro deficit on exam  Troponin wnl  On tele Check echo  Check orthostatics

## 2024-05-21 NOTE — Assessment & Plan Note (Signed)
 TSH abnormal with PCP with note of noncompliance  Continue synthroid 

## 2024-05-21 NOTE — Assessment & Plan Note (Addendum)
 Has not been taking medication per PCP note in August.  Now with son who has taken over medication. He states when he gave both pills it made her sick and she couldn't breath and felt like her bp bottomed out. Will start back norvasc  10mg  daily and see how she does.  PRN [facility] IV with parameters

## 2024-05-21 NOTE — ED Notes (Signed)
 Pt transported to CT ?

## 2024-05-21 NOTE — ED Provider Notes (Signed)
  Physical Exam  BP (!) 158/71   Pulse 75   Temp (!) 97.4 F (36.3 C) (Oral)   Resp 16   SpO2 100%   Physical Exam Vitals and nursing note reviewed.  Constitutional:      General: She is not in acute distress.    Appearance: Normal appearance. She is ill-appearing.  HENT:     Head: Normocephalic.  Pulmonary:     Effort: Pulmonary effort is normal. No respiratory distress.  Skin:    General: Skin is warm.  Neurological:     Mental Status: She is alert. Mental status is at baseline.     Procedures  Procedures  ED Course / MDM   Clinical Course as of 05/21/24 1530  Sat May 21, 2024  1502 UTI. Code Sepsis. Got broad spectrum abx. Mildly elevated lipase. Negative CT. Hx of dementia with waxing and waning a/o status. Family at bedside. On bear hugger.  [CB]    Clinical Course User Index [CB] Beola Terrall RAMAN, PA-C   Medical Decision Making Amount and/or Complexity of Data Reviewed Labs: ordered. Radiology: ordered.  Risk Prescription drug management. Decision regarding hospitalization.   Patient care transferred over from [facility], PA-C.  At time of handoff, awaiting call from hospitalist for admission for urosepsis.  Noted to have coming to the ED for 2 history of nausea, vomiting having a syncopal episode.  Provided by treatment of biotics after being made a code sepsis as well as LR infusion.  Patient vital signs have stabilized however does have lactate of 4.  Patient care then transferred over to [facility] hospitalist.       Beola Terrall RAMAN, PA-C 05/21/24 1531    Garrick Charleston, MD 05/21/24 [phone]

## 2024-05-22 ENCOUNTER — Inpatient Hospital Stay (HOSPITAL_COMMUNITY)

## 2024-05-22 ENCOUNTER — Encounter (HOSPITAL_COMMUNITY): Payer: Self-pay | Admitting: Family Medicine

## 2024-05-22 DIAGNOSIS — R6520 Severe sepsis without septic shock: Secondary | ICD-10-CM | POA: Diagnosis not present

## 2024-05-22 DIAGNOSIS — R55 Syncope and collapse: Secondary | ICD-10-CM

## 2024-05-22 DIAGNOSIS — A419 Sepsis, unspecified organism: Secondary | ICD-10-CM | POA: Diagnosis not present

## 2024-05-22 LAB — ECHOCARDIOGRAM COMPLETE
AR max vel: 2 cm2
AV Area VTI: 1.98 cm2
AV Area mean vel: 1.83 cm2
AV Mean grad: 4 mmHg
AV Peak grad: 7.4 mmHg
Ao pk vel: 1.36 m/s
Area-P 1/2: 2.82 cm2
Calc EF: 71 %
Height: 64 in
MV VTI: 1.8 cm2
S' Lateral: 1.8 cm
Single Plane A2C EF: 77.5 %
Single Plane A4C EF: 65.1 %
Weight: 2201.07 [oz_av]

## 2024-05-22 LAB — COMPREHENSIVE METABOLIC PANEL WITH GFR
ALT: 46 U/L — ABNORMAL HIGH (ref 0–44)
AST: 40 U/L (ref 15–41)
Albumin: 4.2 g/dL (ref 3.5–5.0)
Alkaline Phosphatase: 65 U/L (ref 38–126)
Anion gap: 16 — ABNORMAL HIGH (ref 5–15)
BUN: 14 mg/dL (ref 8–23)
CO2: 24 mmol/L (ref 22–32)
Calcium: 9.5 mg/dL (ref 8.9–10.3)
Chloride: 100 mmol/L (ref 98–111)
Creatinine, Ser: 1.09 mg/dL — ABNORMAL HIGH (ref 0.44–1.00)
GFR, Estimated: 50 mL/min — ABNORMAL LOW (ref >60)
Glucose, Bld: 127 mg/dL — ABNORMAL HIGH (ref 70–99)
Potassium: 3.8 mmol/L (ref 3.5–5.1)
Sodium: 140 mmol/L (ref 135–145)
Total Bilirubin: 0.6 mg/dL (ref 0.0–1.2)
Total Protein: 6.4 g/dL — ABNORMAL LOW (ref 6.5–8.1)

## 2024-05-22 LAB — CBC
HCT: 38.8 % (ref 36.0–46.0)
Hemoglobin: 13.4 g/dL (ref 12.0–15.0)
MCH: 30 pg (ref 26.0–34.0)
MCHC: 34.5 g/dL (ref 30.0–36.0)
MCV: 86.8 fL (ref 80.0–100.0)
Platelets: 231 K/uL (ref 150–400)
RBC: 4.47 MIL/uL (ref 3.87–5.11)
RDW: 13.2 % (ref 11.5–15.5)
WBC: 13.6 K/uL — ABNORMAL HIGH (ref 4.0–10.5)
nRBC: 0 % (ref 0.0–0.2)

## 2024-05-22 LAB — CLOSTRIDIUM DIFFICILE BY PCR, REFLEXED
Hypervirulent Strain: NEGATIVE
Toxigenic C. Difficile by PCR: POSITIVE — AB

## 2024-05-22 LAB — C DIFFICILE QUICK SCREEN W PCR REFLEX
C Diff antigen: POSITIVE — AB
C Diff toxin: NEGATIVE

## 2024-05-22 LAB — LIPASE, BLOOD: Lipase: 37 U/L (ref 11–51)

## 2024-05-22 LAB — PROCALCITONIN: Procalcitonin: 0.2 ng/mL

## 2024-05-22 LAB — LACTIC ACID, PLASMA: Lactic Acid, Venous: 1.7 mmol/L (ref 0.5–1.9)

## 2024-05-22 MED ORDER — KETOROLAC TROMETHAMINE 15 MG/ML IJ SOLN
15.0000 mg | Freq: Once | INTRAMUSCULAR | Status: AC
  Administered 2024-05-22: 15 mg via INTRAVENOUS
  Filled 2024-05-22: qty 1

## 2024-05-22 MED ORDER — VANCOMYCIN HCL 125 MG PO CAPS
125.0000 mg | ORAL_CAPSULE | Freq: Four times a day (QID) | ORAL | Status: DC
  Administered 2024-05-22 – 2024-05-28 (×24): 125 mg via ORAL
  Filled 2024-05-22 (×26): qty 1

## 2024-05-22 MED ORDER — TRIMETHOBENZAMIDE HCL 100 MG/ML IM SOLN: 200.0000 mg | Freq: Four times a day (QID) | INTRAMUSCULAR | Status: DC | PRN

## 2024-05-22 MED ORDER — ALUM & MAG HYDROXIDE-SIMETH 200-200-20 MG/5ML PO SUSP
30.0000 mL | Freq: Once | ORAL | Status: AC
  Administered 2024-05-22: 30 mL via ORAL
  Filled 2024-05-22: qty 30

## 2024-05-22 MED ORDER — SODIUM CHLORIDE 0.9 % IV BOLUS
1000.0000 mL | Freq: Once | INTRAVENOUS | Status: AC
  Administered 2024-05-22: 1000 mL via INTRAVENOUS

## 2024-05-22 MED ORDER — DIPHENHYDRAMINE HCL 50 MG/ML IJ SOLN
12.5000 mg | Freq: Four times a day (QID) | INTRAMUSCULAR | Status: DC | PRN
  Filled 2024-05-22: qty 1

## 2024-05-22 MED ORDER — SUCRALFATE 1 GM/10ML PO SUSP
1.0000 g | Freq: Four times a day (QID) | ORAL | Status: AC
  Administered 2024-05-22 – 2024-05-24 (×8): 1 g via ORAL
  Filled 2024-05-22 (×8): qty 10

## 2024-05-22 MED ORDER — KETOROLAC TROMETHAMINE 15 MG/ML IJ SOLN
15.0000 mg | Freq: Four times a day (QID) | INTRAMUSCULAR | Status: DC | PRN
  Administered 2024-05-23: 15 mg via INTRAVENOUS
  Filled 2024-05-22 (×2): qty 1

## 2024-05-22 MED ORDER — DICYCLOMINE HCL 10 MG PO CAPS
10.0000 mg | ORAL_CAPSULE | Freq: Once | ORAL | Status: AC
  Administered 2024-05-22: 10 mg via ORAL
  Filled 2024-05-22: qty 1

## 2024-05-22 MED ORDER — HYOSCYAMINE SULFATE 0.125 MG SL SUBL
0.2500 mg | SUBLINGUAL_TABLET | Freq: Once | SUBLINGUAL | Status: AC
  Administered 2024-05-22: 0.25 mg via SUBLINGUAL
  Filled 2024-05-22: qty 2

## 2024-05-22 MED ORDER — METOCLOPRAMIDE HCL 5 MG/ML IJ SOLN
10.0000 mg | Freq: Four times a day (QID) | INTRAMUSCULAR | Status: DC | PRN
  Filled 2024-05-22: qty 2

## 2024-05-22 NOTE — Progress Notes (Signed)
 OT Cancellation Note  Patient Details Name: Elizabeth Mcintyre MRN: [id number] DOB: 1941-08-14   Cancelled Treatment:    Reason Eval/Treat Not Completed: Patient declined, reporting general malaise and chest and headache pain. She would like for therapy to check back tomorrow.    [name], OTR/L 05/22/2024, 3:44 PM

## 2024-05-22 NOTE — Evaluation (Signed)
 Physical Therapy Evaluation Patient Details Name: Elizabeth Mcintyre MRN: [id number] DOB: June 18, 1941 Today's Date: 05/22/2024  History of Present Illness  83 yo female admitted with UTI, severe sepsis, syncope, fall. Hx of dementia, CKD, lymphoma, RLS, GERD, hypothyroidism, neuropathy, gout.  Clinical Impression  On eval, pt required Mod A +2 for mobility. She stood at bedside just long enough to get orthostatic BP readings. Pt reports headache and chest pain (headache worse than chest). Pt presents with general weakness, decreased activity tolerance, and impaired gait/balance. Pt remains at high risk for falls when mobilizing. Daughter present during session. Discussed d/c plan-hopeful pt will be able to return home but open to possible need for ST rehab. Patient will benefit from continued inpatient follow up therapy, <3 hours/day .Will plan to follow pt during this hospital stay.       If plan is discharge home, recommend the following: A lot of help with walking and/or transfers;A lot of help with bathing/dressing/bathroom;Assistance with cooking/housework;Assist for transportation;Help with stairs or ramp for entrance   Can travel by private vehicle        Equipment Recommendations Rolling walker (2 wheels)  Recommendations for Other Services       Functional Status Assessment Patient has had a recent decline in their functional status and demonstrates the ability to make significant improvements in function in a reasonable and predictable amount of time.     Precautions / Restrictions Precautions Precautions: Fall Restrictions Weight Bearing Restrictions Per Provider Order: No      Mobility  Bed Mobility Overal bed mobility: Needs Assistance Bed Mobility: Supine to Sit, Sit to Supine     Supine to sit: Mod assist, HOB elevated, Used rails Sit to supine: Mod assist, HOB elevated, Used rails   General bed mobility comments: Increased time. Encouragent required. Assist for  trunk and bil LEs. Pt with flop back onto bed once returning to supine    Transfers Overall transfer level: Needs assistance Equipment used: 2 person hand held assist Transfers: Sit to/from Stand Sit to Stand: Mod assist, +2 physical assistance, +2 safety/equipment           General transfer comment: Assist to stand, stabilize in standing, and control descent. Pt barely able to stand long enough to get standing BP while check orthostatics. Fall risk.    Ambulation/Gait                  Stairs            Wheelchair Mobility     Tilt Bed    Modified Rankin (Stroke Patients Only)       Balance Overall balance assessment: Needs assistance         Standing balance support: Bilateral upper extremity supported Standing balance-Leahy Scale: Poor                               Pertinent Vitals/Pain Pain Assessment Pain Assessment: Faces Faces Pain Scale: Hurts even more Pain Location: headache worse than chest pain per pt Pain Descriptors / Indicators: Aching, Discomfort Pain Intervention(s): Limited activity within patient's tolerance, Monitored during session, Repositioned    Home Living Family/patient expects to be discharged to:: Private residence Living Arrangements: Children;Other relatives Available Help at Discharge: Family;Available 24 hours/day Type of Home: House Home Access: Stairs to enter       Home Layout: Able to live on main level with bedroom/bathroom Home Equipment: Grab bars - tub/shower  Prior Function Prior Level of Function : Independent/Modified Independent                     Extremity/Trunk Assessment   Upper Extremity Assessment Upper Extremity Assessment: Defer to OT evaluation    Lower Extremity Assessment Lower Extremity Assessment: Generalized weakness    Cervical / Trunk Assessment Cervical / Trunk Assessment: Normal  Communication   Communication Communication: No apparent  difficulties    Cognition Arousal: Alert Behavior During Therapy: Flat affect   PT - Cognitive impairments: History of cognitive impairments                         Following commands: Intact       Cueing Cueing Techniques: Verbal cues     General Comments      Exercises     Assessment/Plan    PT Assessment Patient needs continued PT services  PT Problem List Decreased strength;Decreased range of motion;Decreased activity tolerance;Decreased balance;Decreased mobility;Decreased knowledge of use of DME;Decreased cognition;Decreased safety awareness;Pain       PT Treatment Interventions Gait training;Functional mobility training;Therapeutic activities;Therapeutic exercise;Patient/family education    PT Goals (Current goals can be found in the Care Plan section)  Acute Rehab PT Goals Patient Stated Goal: to feel better PT Goal Formulation: With patient/family Time For Goal Achievement: 06/06/24 Potential to Achieve Goals: Good    Frequency Min 3X/week     Co-evaluation               AM-PAC PT 6 Clicks Mobility  Outcome Measure Help needed turning from your back to your side while in a flat bed without using bedrails?: A Little Help needed moving from lying on your back to sitting on the side of a flat bed without using bedrails?: A Lot Help needed moving to and from a bed to a chair (including a wheelchair)?: A Lot Help needed standing up from a chair using your arms (e.g., wheelchair or bedside chair)?: A Lot Help needed to walk in hospital room?: A Lot Help needed climbing 3-5 steps with a railing? : Total 6 Click Score: 12    End of Session Equipment Utilized During Treatment: Gait belt Activity Tolerance: Patient limited by fatigue Patient left: in bed;with call bell/phone within reach;with bed alarm set;with family/visitor present   PT Visit Diagnosis: Muscle weakness (generalized) (M62.81);Difficulty in walking, not elsewhere classified  (R26.2);Pain;History of falling (Z91.81)    Time: 8487-8469 PT Time Calculation (min) (ACUTE ONLY): 18 min   Charges:   PT Evaluation $PT Eval Low Complexity: 1 Low   PT General Charges $$ ACUTE PT VISIT: 1 Visit           Dannial SQUIBB, PT Acute Rehabilitation  Office: [phone]

## 2024-05-22 NOTE — Progress Notes (Signed)
 PT Cancellation Note  Patient Details Name: Elizabeth Mcintyre MRN: [id number] DOB: [birth year]   Cancelled Treatment:    Reason Eval/Treat Not Completed: Patient at procedure or test/unavailable--getting echo when PT checked. Will check back later as schedule allows.    Dannial SQUIBB, PT Acute Rehabilitation  Office: [phone]

## 2024-05-22 NOTE — Hospital Course (Signed)
 Elizabeth Mcintyre is an 83 y.o. female with medical history significant of asthma, HTN, hypothyroidism, dementia, B12 deficiency, RLS, peripheral neuropathy, GERD, gout, HLD, carotid artery stenosis who presented to ED with complaints of syncope and history of vomiting and diarrhea x 2 weeks. Family also was reporting that she has been having chest discomfort for 2 to 3 weeks as well.  She has a history of peptic ulcer disease and used to take Carafate .  She is also compliant on PPI at home. UA was concerning for infection and she was admitted for further workup.  A&P:  Severe sepsis - Hypothermia, tachypnea, lactic acid 4.8.  Urinary source suspected - Continue Rocephin  and follow-up urine culture - Temperature improved with warming blankets on admission  Syncope - Presumed prerenal from diarrhea and poor intake - Low/normal blood pressure this morning; unable to obtain orthostatics due to dizziness - Follow-up echo - Continue telemetry  Diarrhea - Check C. difficile and GI pathogen panel  Hx PUD GERD - continue PPI - add carafate  for now - complaining for epigastric and CP - EKG reassuring - trial of GI cocktail  Acute pain of left shoulder Unsure if from the fall Xray negative for acute finding  CT cervical spine with no acute findings, but moderate to marked severity of DDD at level of C5-C6 She has no radicular symptoms Lidocaine  patch   HTN - low/soft BP - amlodipine  only for now  Mild intermittent asthma Continue nebulizers as needed  Mild dementia -Continue Aricept  and mirtazapine   Hypothyroidism  - continue Synthroid   Dyslipidemia - Continue Crestor   CKD 3A - Baseline creatinine around 1.2 -1.5 -Stable and at baseline  RLS - Continue Requip 

## 2024-05-22 NOTE — Progress Notes (Signed)
 Progress Note    Elizabeth Mcintyre   FMW:[id number]  DOB: 08/01/1941  DOA: 05/21/2024     1 PCP: [name], MD  Initial CC: fall  Hospital Course: Elizabeth Mcintyre is an 83 y.o. female with medical history significant of asthma, HTN, hypothyroidism, dementia, B12 deficiency, RLS, peripheral neuropathy, GERD, gout, HLD, carotid artery stenosis who presented to ED with complaints of syncope and history of vomiting and diarrhea x 2 weeks. Family also was reporting that she has been having chest discomfort for 2 to 3 weeks as well.  She has a history of peptic ulcer disease and used to take Carafate .  She is also compliant on PPI at home. UA was concerning for infection and she was admitted for further workup.  A&P:  Severe sepsis - Hypothermia, tachypnea, lactic acid 4.8.  Urinary source suspected - Continue Rocephin  and follow-up urine culture - Temperature improved with warming blankets on admission  Syncope - Presumed prerenal from diarrhea and poor intake - Low/normal blood pressure this morning; unable to obtain orthostatics due to dizziness - Follow-up echo - Continue telemetry  Diarrhea - Check C. difficile and GI pathogen panel  Hx PUD GERD - continue PPI - add carafate  for now - complaining for epigastric and CP - EKG reassuring - trial of GI cocktail  Acute pain of left shoulder Unsure if from the fall Xray negative for acute finding  CT cervical spine with no acute findings, but moderate to marked severity of DDD at level of C5-C6 She has no radicular symptoms Lidocaine  patch   HTN - low/soft BP - amlodipine  only for now  Mild intermittent asthma Continue nebulizers as needed  Mild dementia -Continue Aricept  and mirtazapine   Hypothyroidism  - continue Synthroid   Dyslipidemia - Continue Crestor   CKD 3A - Baseline creatinine around 1.2 -1.5 -Stable and at baseline  RLS - Continue Requip   Interval History:  No events overnight.  Family  present bedside this morning.  Patient was okay when initially seen but later on in the morning was complaining of some chest pains and increasing belching.  Old records reviewed in assessment of this patient  Antimicrobials: Rocephin  05/21/2024 >> current  DVT prophylaxis:  enoxaparin  (LOVENOX ) injection 30 mg Start: 05/21/24 2200 SCDs Start: 05/21/24 1629 Place TED hose Start: 05/21/24 1627   Code Status:   Code Status: Full Code  Mobility Assessment (Last 72 Hours)     Mobility Assessment     Row Name 05/21/24 2310           Does the patient have exclusion criteria? No - Perform mobility assessment       What is the highest level of mobility based on the mobility assessment? Level 4 (Ambulates with assistance) - Balance while stepping forward/back - Complete          Barriers to discharge: none Disposition Plan:  Home  HH orders placed: n/a Status is: Inpt  Objective: Blood pressure (!) 148/50, pulse 70, temperature 97.8 F (36.6 C), temperature source Oral, resp. rate 17, height 5' 4 (1.626 m), weight 62.4 kg, SpO2 100%.  Examination:  Physical Exam Constitutional:      Appearance: Normal appearance.  HENT:     Head: Normocephalic and atraumatic.     Mouth/Throat:     Mouth: Mucous membranes are moist.  Eyes:     Extraocular Movements: Extraocular movements intact.  Cardiovascular:     Rate and Rhythm: Normal rate and regular rhythm.  Pulmonary:  Effort: Pulmonary effort is normal. No respiratory distress.     Breath sounds: Normal breath sounds. No wheezing.  Abdominal:     General: Bowel sounds are normal. There is no distension.     Palpations: Abdomen is soft.     Tenderness: There is no abdominal tenderness.  Musculoskeletal:        General: Normal range of motion.     Cervical back: Normal range of motion and neck supple.  Skin:    General: Skin is warm and dry.  Neurological:     General: No focal deficit present.     Mental Status: She is  alert.  Psychiatric:        Mood and Affect: Mood normal.      Consultants:    Procedures:    Data Reviewed: Results for orders placed or performed during the hospital encounter of 05/21/24 (from the past 24 hours)  Troponin T, High Sensitivity     Status: None   Collection Time: 05/21/24  3:39 PM  Result Value Ref Range   Troponin T High Sensitivity <15 0 - 19 ng/L  Procalcitonin     Status: None   Collection Time: 05/21/24  3:39 PM  Result Value Ref Range   Procalcitonin <0.10 ng/mL  Magnesium      Status: None   Collection Time: 05/21/24  3:39 PM  Result Value Ref Range   Magnesium  1.9 1.7 - 2.4 mg/dL  I-Stat Lactic Acid, ED     Status: Abnormal   Collection Time: 05/21/24  3:45 PM  Result Value Ref Range   Lactic Acid, Venous 4.8 (HH) 0.5 - 1.9 mmol/L   Comment NOTIFIED PHYSICIAN   I-Stat Lactic Acid, ED     Status: Abnormal   Collection Time: 05/21/24  5:32 PM  Result Value Ref Range   Lactic Acid, Venous 4.0 (HH) 0.5 - 1.9 mmol/L   Comment NOTIFIED PHYSICIAN   Ferritin     Status: Abnormal   Collection Time: 05/21/24  7:50 PM  Result Value Ref Range   Ferritin 390 (H) 11 - 307 ng/mL  Lactic acid, plasma     Status: Abnormal   Collection Time: 05/21/24  7:51 PM  Result Value Ref Range   Lactic Acid, Venous 2.9 (HH) 0.5 - 1.9 mmol/L  Procalcitonin     Status: None   Collection Time: 05/22/24  3:06 AM  Result Value Ref Range   Procalcitonin 0.20 ng/mL  Comprehensive metabolic panel     Status: Abnormal   Collection Time: 05/22/24  3:06 AM  Result Value Ref Range   Sodium 140 135 - 145 mmol/L   Potassium 3.8 3.5 - 5.1 mmol/L   Chloride 100 98 - 111 mmol/L   CO2 24 22 - 32 mmol/L   Glucose, Bld 127 (H) 70 - 99 mg/dL   BUN 14 8 - 23 mg/dL   Creatinine, Ser 8.90 (H) 0.44 - 1.00 mg/dL   Calcium  9.5 8.9 - 10.3 mg/dL   Total Protein 6.4 (L) 6.5 - 8.1 g/dL   Albumin 4.2 3.5 - 5.0 g/dL   AST 40 15 - 41 U/L   ALT 46 (H) 0 - 44 U/L   Alkaline Phosphatase 65 38 -  126 U/L   Total Bilirubin 0.6 0.0 - 1.2 mg/dL   GFR, Estimated 50 (L) >60 mL/min   Anion gap 16 (H) 5 - 15  CBC     Status: Abnormal   Collection Time: 05/22/24  3:06 AM  Result Value  Ref Range   WBC 13.6 (H) 4.0 - 10.5 K/uL   RBC 4.47 3.87 - 5.11 MIL/uL   Hemoglobin 13.4 12.0 - 15.0 g/dL   HCT 61.1 63.9 - 53.9 %   MCV 86.8 80.0 - 100.0 fL   MCH 30.0 26.0 - 34.0 pg   MCHC 34.5 30.0 - 36.0 g/dL   RDW 86.7 88.4 - 84.4 %   Platelets 231 150 - 400 K/uL   nRBC 0.0 0.0 - 0.2 %  Lipase, blood     Status: None   Collection Time: 05/22/24  3:06 AM  Result Value Ref Range   Lipase 37 11 - 51 U/L  Lactic acid, plasma     Status: None   Collection Time: 05/22/24  3:06 AM  Result Value Ref Range   Lactic Acid, Venous 1.7 0.5 - 1.9 mmol/L  C Difficile Quick Screen w PCR reflex     Status: Abnormal   Collection Time: 05/22/24  8:36 AM   Specimen: Stool  Result Value Ref Range   C Diff antigen POSITIVE (A) NEGATIVE   C Diff toxin NEGATIVE NEGATIVE   C Diff interpretation Results are indeterminate. See PCR results.     I have reviewed pertinent nursing notes, vitals, labs, and images as necessary. I have ordered labwork to follow up on as indicated.  I have reviewed the last notes from staff over past 24 hours. I have discussed patient's care plan and test results with nursing staff, CM/SW, and other staff as appropriate.  Time spent: Greater than 50% of the 55 minute visit was spent in counseling/coordination of care for the patient as laid out in the A&P.   LOS: 1 day   Alm Apo, MD [facility] 05/22/2024, 1:58 PM

## 2024-05-23 ENCOUNTER — Encounter (HOSPITAL_COMMUNITY): Payer: Self-pay | Admitting: Family Medicine

## 2024-05-23 DIAGNOSIS — A419 Sepsis, unspecified organism: Secondary | ICD-10-CM | POA: Diagnosis not present

## 2024-05-23 DIAGNOSIS — R6520 Severe sepsis without septic shock: Secondary | ICD-10-CM | POA: Diagnosis not present

## 2024-05-23 LAB — BASIC METABOLIC PANEL WITH GFR
Anion gap: 17 — ABNORMAL HIGH (ref 5–15)
BUN: 11 mg/dL (ref 8–23)
CO2: 21 mmol/L — ABNORMAL LOW (ref 22–32)
Calcium: 8.9 mg/dL (ref 8.9–10.3)
Chloride: 103 mmol/L (ref 98–111)
Creatinine, Ser: 1.25 mg/dL — ABNORMAL HIGH (ref 0.44–1.00)
GFR, Estimated: 43 mL/min — ABNORMAL LOW (ref >60)
Glucose, Bld: 180 mg/dL — ABNORMAL HIGH (ref 70–99)
Potassium: 2.7 mmol/L — CL (ref 3.5–5.1)
Sodium: 141 mmol/L (ref 135–145)

## 2024-05-23 LAB — CBC WITH DIFFERENTIAL/PLATELET
Abs Immature Granulocytes: 0.05 K/uL (ref 0.00–0.07)
Basophils Absolute: 0.1 K/uL (ref 0.0–0.1)
Basophils Relative: 1 %
Eosinophils Absolute: 0.4 K/uL (ref 0.0–0.5)
Eosinophils Relative: 5 %
HCT: 40.3 % (ref 36.0–46.0)
Hemoglobin: 14 g/dL (ref 12.0–15.0)
Immature Granulocytes: 1 %
Lymphocytes Relative: 44 %
Lymphs Abs: 3.4 K/uL (ref 0.7–4.0)
MCH: 29.6 pg (ref 26.0–34.0)
MCHC: 34.7 g/dL (ref 30.0–36.0)
MCV: 85.2 fL (ref 80.0–100.0)
Monocytes Absolute: 0.5 K/uL (ref 0.1–1.0)
Monocytes Relative: 7 %
Neutro Abs: 3.3 K/uL (ref 1.7–7.7)
Neutrophils Relative %: 42 %
Platelets: 149 K/uL — ABNORMAL LOW (ref 150–400)
RBC: 4.73 MIL/uL (ref 3.87–5.11)
RDW: 13.2 % (ref 11.5–15.5)
WBC: 7.7 K/uL (ref 4.0–10.5)
nRBC: 0 % (ref 0.0–0.2)

## 2024-05-23 LAB — URINE CULTURE: Culture: 70000 — AB

## 2024-05-23 LAB — MAGNESIUM: Magnesium: 1.9 mg/dL (ref 1.7–2.4)

## 2024-05-23 MED ORDER — PANTOPRAZOLE SODIUM 40 MG PO TBEC
40.0000 mg | DELAYED_RELEASE_TABLET | Freq: Every day | ORAL | Status: DC
Start: 2024-05-23 — End: 2024-05-25
  Administered 2024-05-24 – 2024-05-25 (×2): 40 mg via ORAL
  Filled 2024-05-23 (×2): qty 1

## 2024-05-23 MED ORDER — PANTOPRAZOLE SODIUM 40 MG PO TBEC
40.0000 mg | DELAYED_RELEASE_TABLET | Freq: Two times a day (BID) | ORAL | Status: DC
Start: 2024-05-23 — End: 2024-05-23

## 2024-05-23 MED ORDER — LACTATED RINGERS IV SOLN
INTRAVENOUS | Status: AC
Start: 2024-05-23 — End: 2024-05-23

## 2024-05-23 MED ORDER — SIMETHICONE 80 MG PO CHEW
80.0000 mg | CHEWABLE_TABLET | Freq: Four times a day (QID) | ORAL | Status: DC | PRN
  Administered 2024-05-24 – 2024-05-25 (×2): 80 mg via ORAL
  Filled 2024-05-23 (×2): qty 1

## 2024-05-23 MED ORDER — POTASSIUM CHLORIDE CRYS ER 20 MEQ PO TBCR
40.0000 meq | EXTENDED_RELEASE_TABLET | ORAL | Status: AC
  Administered 2024-05-23 (×2): 40 meq via ORAL
  Filled 2024-05-23 (×2): qty 2

## 2024-05-23 NOTE — NC FL2 (Signed)
 [place]  MEDICAID FL2 LEVEL OF CARE FORM     IDENTIFICATION  Patient Name: Elizabeth Mcintyre Birthdate: Jun 02, 1941 Sex: female Admission Date (Current Location): 05/21/2024  [facility] and [place] Number:  Producer, television/film/video and Address:  [facility],  [address] [place] [address]      Provider Number:    Attending Physician Name and Address:  Madelyne Owen LABOR, MD  Relative Name and Phone Number:  Moss,Wendy (Daughter)  [phone] (Mobile)    Current Level of Care: Hospital Recommended Level of Care: Skilled Nursing Facility Prior Approval Number:    Date Approved/Denied:   PASRR Number: [id number]  Discharge Plan: SNF    Current Diagnoses: Patient Active Problem List   Diagnosis Date Noted   Severe sepsis from UTI 05/21/2024   Acute pain of left shoulder 05/21/2024   Moderate Alzheimer's dementia (HCC) 05/10/2024   Hospital discharge follow-up 05/10/2024   Gastroesophageal reflux disease 05/01/2024   Hypothyroidism 05/01/2024   Peripheral neuropathy 05/01/2024   Acute kidney injury superimposed on chronic kidney disease (HCC) 05/01/2024   Hyponatremia 05/01/2024   UTI (urinary tract infection) 06/17/2023   Nausea and vomiting 05/18/2023   Chest pain 05/18/2023   Hypokalemia 05/18/2023   QT prolongation 05/18/2023   Syncope 05/18/2023   Suspected COVID-19 virus infection 05/08/2023   Prediabetes 02/20/2023   Stress incontinence of urine 02/19/2023   Postherpetic neuralgia 01/22/2023   Mild dementia, unclear etiology 11/12/2022   Lacunar infarction    Acute cystitis without hematuria 10/09/2022   Encounter for general adult medical examination with abnormal findings 05/05/2022   Acute pain of right shoulder 04/16/2022   Restless legs syndrome 01/02/2021   Mixed hyperlipidemia 08/14/2020   Dysphagia 04/18/2020   Port-A-Cath in place 03/28/2020   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   Hepatic steatosis 01/04/2020   Splenomegaly 01/04/2020    Thrombocytopenia 01/04/2020   Diarrhea 09/10/2018   Carotid artery stenosis 06/13/2016   Postural dizziness 06/06/2016   Stage 3a chronic kidney disease 02/14/2016   Vitamin D  deficiency 05/25/2015   Gastroesophageal reflux disease with esophagitis 03/06/2015   Osteopenia 12/02/2013   Atypical chest pain 07/22/2011   Major depression, recurrent, chronic (HCC) 04/03/2011   Dyslipidemia 11/09/2007   Mild intermittent asthma 11/09/2007   Essential hypertension 11/09/2007    Orientation RESPIRATION BLADDER Height & Weight     Self, Situation, Time, Place  Normal Incontinent Weight: 137 lb 9.1 oz (62.4 kg) Height:  5' 4 (162.6 cm)  BEHAVIORAL SYMPTOMS/MOOD NEUROLOGICAL BOWEL NUTRITION STATUS      Continent Diet (regular)  AMBULATORY STATUS COMMUNICATION OF NEEDS Skin   Limited Assist Verbally Normal                       Personal Care Assistance Level of Assistance  Bathing, Feeding, Dressing Bathing Assistance: Limited assistance Feeding assistance: Independent Dressing Assistance: Limited assistance     Functional Limitations Info  Sight, Hearing, Speech Sight Info: Adequate Hearing Info: Adequate Speech Info: Adequate    SPECIAL CARE FACTORS FREQUENCY  PT (By licensed PT), OT (By licensed OT)     PT Frequency: 5 x a week OT Frequency: 5 x a week            Contractures Contractures Info: Not present    Additional Factors Info  Code Status, Allergies Code Status Info: full Allergies Info: codeine           Current Medications (05/23/2024):  This is the current hospital  active medication list Current Facility-Administered Medications  Medication Dose Route Frequency Provider Last Rate Last Admin   acetaminophen  (TYLENOL ) tablet 650 mg  650 mg Oral Q6H PRN Waddell Rake, MD   650 mg at 05/23/24 1223   Or   acetaminophen  (TYLENOL ) suppository 650 mg  650 mg Rectal Q6H PRN Waddell Rake, MD       amLODipine  (NORVASC ) tablet 10 mg  10 mg Oral Daily  Waddell Rake, MD   10 mg at 05/23/24 9048   cefTRIAXone  (ROCEPHIN ) 1 g in sodium chloride  0.9 % 100 mL IVPB  1 g Intravenous Q24H Waddell Rake, MD 200 mL/hr at 05/22/24 2206 1 g at 05/22/24 2206   metoCLOPramide  (REGLAN ) injection 10 mg  10 mg Intravenous Q6H PRN Patsy Lenis, MD       And   diphenhydrAMINE  (BENADRYL ) injection 12.5 mg  12.5 mg Intravenous Q6H PRN Patsy Lenis, MD       donepezil  (ARICEPT ) tablet 10 mg  10 mg Oral Daily Waddell Rake, MD   10 mg at 05/23/24 9048   enoxaparin  (LOVENOX ) injection 30 mg  30 mg Subcutaneous Q24H Waddell Rake, MD   30 mg at 05/22/24 2207   HYDROcodone -acetaminophen  (NORCO/VICODIN) 5-325 MG per tablet 1 tablet  1 tablet Oral Q6H PRN Waddell Rake, MD   1 tablet at 05/22/24 2208   lactated ringers  infusion   Intravenous Continuous Regalado, Belkys A, MD 75 mL/hr at 05/23/24 1227 New Bag at 05/23/24 1227   levothyroxine  (SYNTHROID ) tablet 75 mcg  75 mcg Oral Q0600 Waddell Rake, MD   75 mcg at 05/23/24 0539   lidocaine  (LIDODERM ) 5 % 1 patch  1 patch Transdermal Q24H Waddell Rake, MD   1 patch at 05/22/24 1740   metoprolol  tartrate (LOPRESSOR ) injection 5 mg  5 mg Intravenous Q8H PRN Waddell Rake, MD       mirtazapine  (REMERON ) tablet 15 mg  15 mg Oral QHS Waddell Rake, MD   15 mg at 05/22/24 2207   pantoprazole  (PROTONIX ) EC tablet 40 mg  40 mg Oral Daily Regalado, Belkys A, MD       rOPINIRole  (REQUIP ) tablet 0.25 mg  0.25 mg Oral QHS Waddell Rake, MD   0.25 mg at 05/22/24 2207   rosuvastatin  (CRESTOR ) tablet 10 mg  10 mg Oral Daily Waddell Rake, MD   10 mg at 05/23/24 9048   simethicone  (MYLICON) chewable tablet 80 mg  80 mg Oral QID PRN Regalado, Belkys A, MD       sucralfate  (CARAFATE ) 1 GM/10ML suspension 1 g  1 g Oral QID Patsy Lenis, MD   1 g at 05/23/24 1222   trimethobenzamide  (TIGAN ) injection 200 mg  200 mg Intramuscular Q6H PRN Patsy Lenis, MD       vancomycin  (VANCOCIN ) capsule 125 mg  125 mg Oral QID Patsy Lenis, MD   125 mg at 05/23/24 1222     Discharge Medications: Please see discharge summary for a list of discharge medications.  Relevant Imaging Results:  Relevant Lab Results:   Additional Information [id number]  Tawni HERO Hafsa Lohn, LCSW

## 2024-05-23 NOTE — Evaluation (Signed)
 Occupational Therapy Evaluation Patient Details Name: Elizabeth Mcintyre MRN: [id number] DOB: Nov 10, 1940 Today's Date: 05/23/2024   History of Present Illness   83 yo female admitted with UTI, severe sepsis, syncope, fall. Hx of dementia, CKD, lymphoma, RLS, GERD, hypothyroidism, neuropathy, gout.     Clinical Impressions Pt presented in bed with daughter present throughout the session. She was able to complete bed mobility with CGA and sit to stand transfers with CGA to RW . She completed transfer to chair and then to [facility] as decline mobility to bathroom due to having headache and chest pain. (Pt did note was burping frequently in session and reported feeling less chest pressure following. ) Pt then completed peri care with CGA and agreeable to sit in chair with encouragement. Patient will benefit from continued inpatient follow up therapy, <3 hours/day.  BP: Supine: 1238/ 47 (70) HR 62 Sitting: 126/54 (74) HR 68 Standing: 126/58 (76) HR 68 Post peri care: 125/56 HR 74     If plan is discharge home, recommend the following:   A little help with walking and/or transfers;A little help with bathing/dressing/bathroom;Assistance with cooking/housework;Direct supervision/assist for medications management;Direct supervision/assist for financial management;Assist for transportation;Help with stairs or ramp for entrance;Supervision due to cognitive status     Functional Status Assessment   Patient has had a recent decline in their functional status and demonstrates the ability to make significant improvements in function in a reasonable and predictable amount of time.     Equipment Recommendations   BSC/3in1 (RW)     Recommendations for Other Services         Precautions/Restrictions   Precautions Precautions: Fall Recall of Precautions/Restrictions: Impaired Restrictions Weight Bearing Restrictions Per Provider Order: No     Mobility Bed Mobility Overal bed mobility:  Needs Assistance Bed Mobility: Supine to Sit     Supine to sit: Contact guard, HOB elevated, Used rails     General bed mobility comments: increase in time and cues on sequencing    Transfers Overall transfer level: Needs assistance Equipment used: Rolling walker (2 wheels) Transfers: Sit to/from Stand Sit to Stand: Contact guard assist, Min assist           General transfer comment: min from comode as fearful      Balance Overall balance assessment: Needs assistance Sitting-balance support: Feet supported Sitting balance-Leahy Scale: Good     Standing balance support: Bilateral upper extremity supported, No upper extremity supported, Single extremity supported Standing balance-Leahy Scale: Fair                             ADL either performed or assessed with clinical judgement   ADL Overall ADL's : Needs assistance/impaired Eating/Feeding: Set up;Sitting Eating/Feeding Details (indicate cue type and reason): family reporting they have to encourage her to eat Grooming: Wash/dry hands;Oral care;Set up;Sitting   Upper Body Bathing: Set up;Sitting   Lower Body Bathing: Contact guard assist;Sit to/from stand;Sitting/lateral leans   Upper Body Dressing : Set up;Sitting   Lower Body Dressing: Contact guard assist;Sit to/from stand   Toilet Transfer: Contact guard assist;Rolling walker (2 wheels)   Toileting- Clothing Manipulation and Hygiene: Contact guard assist;Sit to/from stand       Functional mobility during ADLs: Contact guard assist;Cueing for sequencing;Rolling walker (2 wheels);Cueing for safety       Vision Baseline Vision/History: 1 Wears glasses Ability to See in Adequate Light: 0 Adequate Patient Visual Report: No change from baseline Vision  Assessment?: Wears glasses for reading     Perception Perception: Within Functional Limits       Praxis Praxis: [facility]       Pertinent Vitals/Pain Pain Assessment Pain Assessment:  Faces Faces Pain Scale: Hurts even more Pain Location: headache/chest pain Pain Descriptors / Indicators: Aching, Discomfort Pain Intervention(s): Limited activity within patient's tolerance, Monitored during session, Repositioned     Extremity/Trunk Assessment Upper Extremity Assessment Upper Extremity Assessment: Generalized weakness (Pt's reported AROM about baseline with about 80 deg shoulder flex/abduction)   Lower Extremity Assessment Lower Extremity Assessment: Defer to PT evaluation   Cervical / Trunk Assessment Cervical / Trunk Assessment: Normal   Communication Communication Communication: No apparent difficulties   Cognition Arousal: Alert Behavior During Therapy: Flat affect Cognition: History of cognitive impairments                               Following commands: Impaired Following commands impaired: Only follows one step commands consistently, Follows multi-step commands inconsistently     Cueing  General Comments   Cueing Techniques: Verbal cues      Exercises     Shoulder Instructions      Home Living Family/patient expects to be discharged to:: Private residence Living Arrangements: Children;Other relatives Available Help at Discharge: Family;Available 24 hours/day Type of Home: House Home Access: Stairs to enter [employer] of Steps: 5   Home Layout: Able to live on main level with bedroom/bathroom     Bathroom Shower/Tub: Tub/shower unit         Home Equipment: Grab bars - tub/shower          Prior Functioning/Environment Prior Level of Function : Independent/Modified Independent                    OT Problem List: Decreased strength;Decreased activity tolerance;Impaired balance (sitting and/or standing);Decreased safety awareness;Decreased knowledge of use of DME or AE;Pain   OT Treatment/Interventions: Self-care/ADL training;Therapeutic exercise;Therapeutic activities;Balance  training;Patient/family education      OT Goals(Current goals can be found in the care plan section)   Acute Rehab OT Goals Patient Stated Goal: to go to rehab OT Goal Formulation: With patient Time For Goal Achievement: 06/06/24 Potential to Achieve Goals: Good   OT Frequency:  Min 2X/week    Co-evaluation              AM-PAC OT 6 Clicks Daily Activity     Outcome Measure Help from another person eating meals?: A Little Help from another person taking care of personal grooming?: A Little Help from another person toileting, which includes using toliet, bedpan, or urinal?: A Little Help from another person bathing (including washing, rinsing, drying)?: A Little Help from another person to put on and taking off regular upper body clothing?: None Help from another person to put on and taking off regular lower body clothing?: A Little 6 Click Score: 19   End of Session Equipment Utilized During Treatment: Gait belt;Rolling walker (2 wheels) Nurse Communication: Mobility status (pain)  Activity Tolerance: Patient limited by pain Patient left: in chair;with call bell/phone within reach;with chair alarm set;with family/visitor present  OT Visit Diagnosis: Unsteadiness on feet (R26.81);Other abnormalities of gait and mobility (R26.89);Muscle weakness (generalized) (M62.81);History of falling (Z91.81);Pain Pain - Right/Left:  (head/chest)                Time: 9176-9089 OT Time Calculation (min): 47 min Charges:  OT General Charges $  OT Visit: 1 Visit OT Evaluation $OT Eval Low Complexity: 1 Low OT Treatments $Self Care/Home Management : 23-37 mins  Warrick POUR OTR/L  Acute Rehab Services  [phone] office number   Warrick Berber 05/23/2024, 9:23 AM

## 2024-05-23 NOTE — Plan of Care (Signed)
   Problem: Education: Goal: Knowledge of General Education information will improve Description: Including pain rating scale, medication(s)/side effects and non-pharmacologic comfort measures Outcome: Progressing   Problem: Clinical Measurements: Goal: Ability to maintain clinical measurements within normal limits will improve Outcome: Progressing

## 2024-05-23 NOTE — Progress Notes (Signed)
 PROGRESS NOTE    Elizabeth Mcintyre  FMW:[id number] DOB: [birth year] DOA: 05/21/2024 PCP: [name], MD   Brief Narrative: 83 year old with past medical history significant for asthma, hypertension, hypothyroidism, dementia, B12 deficiency, RLS, peripheral neuropathy, GERD, gout, hyperlipidemia, carotid artery stenosis presents complaining of syncope and history of vomiting and diarrhea for 2 weeks.  Patient also has been having chest discomfort for the last 2 or 3 weeks.  She has a history of peptic ulcer disease and used to take Carafate .  She is compliant taking her PPI at home.  UA was concerning for infection and she was admitted for further workup.   Assessment & Plan:   Principal Problem:   Severe sepsis from UTI Active Problems:   Syncope   Acute pain of left shoulder   Diarrhea   Essential hypertension   Mild intermittent asthma   Mild dementia, unclear etiology   Hypothyroidism   Gastroesophageal reflux disease   Dyslipidemia   Stage 3a chronic kidney disease   Restless legs syndrome   UTI (urinary tract infection)   1-Severe Sepsis UTI Present with tachycardia lactic acidosis 4.8, hypothermia.  Source of infection UTI versus C. difficile colitis. -Plan to treat for UTI for 3 days due to active CAD - Urine culture growing 70,000 colony of E. coli and 3000 of Streptococcus agalactiae.  Syncope - Suspect related to hypovolemia in the setting of dehydration diarrhea and poor oral intake -Echo normal ejection fraction -Will give IV fluids today.  Diarrhea, C. difficile colitis: C diff antigen  positive and PCR>  Started on Oral Vancomycin .   History of PUD GERD;  Report belching and chest pain.  Chest pain likely related to Dyspepsia/ ECHO normal.  Started on Carafate  and continue PPI daily.  Of note, she is S/P cholecystectomy 2018.  CT abdomen pelvis no biliary duct dilation.   Acute pain in the left shoulder Continue with lidocaine .    Hypertension: Continue amlodipine   Mild intermittent asthma - Continue nebulizers as needed  Mild dementia; Continue Aricept  and mirtazapine   Hypothyroidism Continue Synthroid   Dyslipidemia - Continue Crestor   CKD 3 A: - Baseline creatinine around 1.2--- 1.5 Stable  RLS Continue Requip   Hypokalemia: Replete orally   Estimated body mass index is 23.61 kg/m as calculated from the following:   Height as of this encounter: 5' 4 (1.626 m).   Weight as of this encounter: 62.4 kg.   DVT prophylaxis: Lovenox  Code Status: Full code Family Communication: Care discussed with family at bedside Disposition Plan:  Status is: Inpatient Remains inpatient appropriate because: Management of diarrhea and UTI    Consultants:  none  Procedures:  Echo  Antimicrobials:    Subjective: She is alert, she developed dizziness and headache and chest pain when she was getting transition from the recliner to the bed. Continued to have watery stool last episode this morning. She denies significant abdominal pain. Per family she has not been eating well, and she has been having recurrent belching Family was concerned about gallbladder pathology.  Looking at records patient had a cholecystectomy 2018  Objective: Vitals:   05/22/24 1103 05/22/24 1230 05/22/24 2146 05/23/24 0521  BP: (!) 122/47 (!) 148/50 (!) 143/55 (!) 143/66  Pulse: 82 70 60   Resp: 20 17 18 18   Temp: (!) 97.5 F (36.4 C) 97.8 F (36.6 C) 98.2 F (36.8 C) 97.8 F (36.6 C)  TempSrc: Oral Oral Oral Oral  SpO2: 100% 100% 100% 97%  Weight:  Height:        Intake/Output Summary (Last 24 hours) at 05/23/2024 0823 Last data filed at 05/23/2024 0500 Gross per 24 hour  Intake 820 ml  Output 1550 ml  Net -730 ml   Filed Weights   05/21/24 2300  Weight: 62.4 kg    Examination:  General exam: Appears calm and comfortable  Respiratory system: Clear to auscultation. Respiratory effort  normal. Cardiovascular system: S1 & S2 heard, RRR.  Gastrointestinal system: Abdomen is nondistended, soft and nontender. No organomegaly or masses felt. Normal bowel sounds heard. Central nervous system: Alert and oriented.  Extremities: Symmetric 5 x 5 power.    Data Reviewed: I have personally reviewed following labs and imaging studies  CBC: Recent Labs  Lab 05/21/24 1249 05/22/24 0306 05/23/24 0407  WBC 10.9* 13.6* 7.7  NEUTROABS 7.6  --  3.3  HGB 14.8 13.4 14.0  HCT 42.2 38.8 40.3  MCV 85.9 86.8 85.2  PLT 264 231 149*   Basic Metabolic Panel: Recent Labs  Lab 05/21/24 1249 05/21/24 1539 05/22/24 0306 05/23/24 0654  NA 138  --  140 141  K 3.7  --  3.8 2.7*  CL 99  --  100 103  CO2 20*  --  24 21*  GLUCOSE 219*  --  127* 180*  BUN 22  --  14 11  CREATININE 1.35*  --  1.09* 1.25*  CALCIUM  10.4*  --  9.5 8.9  MG  --  1.9  --  1.9   GFR: Estimated Creatinine Clearance: 30 mL/min (A) (by C-G formula based on SCr of 1.25 mg/dL (H)). Liver Function Tests: Recent Labs  Lab 05/21/24 1249 05/22/24 0306  AST 43* 40  ALT 56* 46*  ALKPHOS 73 65  BILITOT 0.7 0.6  PROT 7.2 6.4*  ALBUMIN 4.6 4.2   Recent Labs  Lab 05/21/24 1249 05/22/24 0306  LIPASE 99* 37   No results for input(s): AMMONIA in the last 168 hours. Coagulation Profile: Recent Labs  Lab 05/21/24 1306  INR 0.9   Cardiac Enzymes: No results for input(s): CKTOTAL, CKMB, CKMBINDEX, TROPONINI in the last 168 hours. BNP (last 3 results) No results for input(s): PROBNP in the last 8760 hours. HbA1C: No results for input(s): HGBA1C in the last 72 hours. CBG: No results for input(s): GLUCAP in the last 168 hours. Lipid Profile: No results for input(s): CHOL, HDL, LDLCALC, TRIG, CHOLHDL, LDLDIRECT in the last 72 hours. Thyroid  Function Tests: No results for input(s): TSH, T4TOTAL, FREET4, T3FREE, THYROIDAB in the last 72 hours. Anemia Panel: Recent Labs     05/21/24 1250 05/21/24 1950  VITAMINB12 777  --   FERRITIN  --  390*   Sepsis Labs: Recent Labs  Lab 05/21/24 1539 05/21/24 1545 05/21/24 1732 05/21/24 1951 05/22/24 0306  PROCALCITON <0.10  --   --   --  0.20  LATICACIDVEN  --  4.8* 4.0* 2.9* 1.7    Recent Results (from the past 240 hours)  Urine Culture     Status: Abnormal   Collection Time: 05/21/24 12:41 PM   Specimen: Urine, Random  Result Value Ref Range Status   Specimen Description   Final    URINE, RANDOM Performed at [facility], [address], [place], [place] [address]    Special Requests   Final    NONE Reflexed from [phone] Performed at [facility], [address] [place], [place] [address]    Culture (A)  Final  70,000 COLONIES/mL ESCHERICHIA COLI 30,000 COLONIES/mL STREPTOCOCCUS AGALACTIAE TESTING AGAINST S. AGALACTIAE NOT ROUTINELY PERFORMED DUE TO PREDICTABILITY OF AMP/PEN/VAN SUSCEPTIBILITY. Performed at [facility], [address] [place], [place] [address]    Report Status 05/23/2024 FINAL  Final   Organism ID, Bacteria ESCHERICHIA COLI (A)  Final      Susceptibility   Escherichia coli - MIC*    AMPICILLIN <=2 SENSITIVE Sensitive     CEFAZOLIN  (URINE) Value in next row Sensitive      <=1 SENSITIVEThis is a modified FDA-approved test that has been validated and its performance characteristics determined by the reporting laboratory.  This laboratory is certified under the Clinical Laboratory Improvement Amendments CLIA as qualified to perform high complexity clinical laboratory testing.    CEFEPIME  Value in next row Sensitive      <=1 SENSITIVEThis is a modified FDA-approved test that has been validated and its performance characteristics determined by the reporting laboratory.  This laboratory is certified under the Clinical Laboratory Improvement Amendments CLIA as qualified to perform high complexity clinical laboratory testing.    ERTAPENEM Value  in next row Sensitive      <=1 SENSITIVEThis is a modified FDA-approved test that has been validated and its performance characteristics determined by the reporting laboratory.  This laboratory is certified under the Clinical Laboratory Improvement Amendments CLIA as qualified to perform high complexity clinical laboratory testing.    CEFTRIAXONE  Value in next row Sensitive      <=1 SENSITIVEThis is a modified FDA-approved test that has been validated and its performance characteristics determined by the reporting laboratory.  This laboratory is certified under the Clinical Laboratory Improvement Amendments CLIA as qualified to perform high complexity clinical laboratory testing.    CIPROFLOXACIN  Value in next row Sensitive      <=1 SENSITIVEThis is a modified FDA-approved test that has been validated and its performance characteristics determined by the reporting laboratory.  This laboratory is certified under the Clinical Laboratory Improvement Amendments CLIA as qualified to perform high complexity clinical laboratory testing.    GENTAMICIN Value in next row Sensitive      <=1 SENSITIVEThis is a modified FDA-approved test that has been validated and its performance characteristics determined by the reporting laboratory.  This laboratory is certified under the Clinical Laboratory Improvement Amendments CLIA as qualified to perform high complexity clinical laboratory testing.    NITROFURANTOIN Value in next row Sensitive      <=1 SENSITIVEThis is a modified FDA-approved test that has been validated and its performance characteristics determined by the reporting laboratory.  This laboratory is certified under the Clinical Laboratory Improvement Amendments CLIA as qualified to perform high complexity clinical laboratory testing.    TRIMETH /SULFA  Value in next row Sensitive      <=1 SENSITIVEThis is a modified FDA-approved test that has been validated and its performance characteristics determined by the  reporting laboratory.  This laboratory is certified under the Clinical Laboratory Improvement Amendments CLIA as qualified to perform high complexity clinical laboratory testing.    AMPICILLIN/SULBACTAM Value in next row Sensitive      <=1 SENSITIVEThis is a modified FDA-approved test that has been validated and its performance characteristics determined by the reporting laboratory.  This laboratory is certified under the Clinical Laboratory Improvement Amendments CLIA as qualified to perform high complexity clinical laboratory testing.    PIP/TAZO Value in next row Sensitive ug/mL     <=4 SENSITIVEThis is a modified FDA-approved test that has been validated and its performance characteristics  determined by the reporting laboratory.  This laboratory is certified under the Clinical Laboratory Improvement Amendments CLIA as qualified to perform high complexity clinical laboratory testing.    MEROPENEM Value in next row Sensitive      <=4 SENSITIVEThis is a modified FDA-approved test that has been validated and its performance characteristics determined by the reporting laboratory.  This laboratory is certified under the Clinical Laboratory Improvement Amendments CLIA as qualified to perform high complexity clinical laboratory testing.    * 70,000 COLONIES/mL ESCHERICHIA COLI  Blood Culture (routine x 2)     Status: None (Preliminary result)   Collection Time: 05/21/24 12:49 PM   Specimen: BLOOD  Result Value Ref Range Status   Specimen Description   Final    BLOOD RIGHT ANTECUBITAL Performed at [facility], [address] [place], [place] [address]    Special Requests   Final    BOTTLES DRAWN AEROBIC AND ANAEROBIC Blood Culture adequate volume Performed at [facility], [address] [place], [place] [address]    Culture   Final    NO GROWTH < 24 HOURS Performed at [facility], [address], [place], [place] [address]    Report Status PENDING   Incomplete  Blood Culture (routine x 2)     Status: None (Preliminary result)   Collection Time: 05/21/24  1:25 PM   Specimen: BLOOD  Result Value Ref Range Status   Specimen Description   Final    BLOOD LEFT ANTECUBITAL Performed at [facility], [address], [place], [place] [address]    Special Requests   Final    BOTTLES DRAWN AEROBIC ONLY Blood Culture results may not be optimal due to an inadequate volume of blood received in culture bottles Performed at [facility], [address], [place], [place] [address]    Culture   Final    NO GROWTH < 24 HOURS Performed at [facility], [address] [place], [place] [address]    Report Status PENDING  Incomplete  Resp panel by RT-PCR (RSV, Flu A&B, Covid) Anterior Nasal Swab     Status: None   Collection Time: 05/21/24  1:34 PM   Specimen: Anterior Nasal Swab  Result Value Ref Range Status   SARS Coronavirus 2 by RT PCR NEGATIVE NEGATIVE Final    Comment: (NOTE) SARS-CoV-2 target nucleic acids are NOT DETECTED.  The SARS-CoV-2 RNA is generally detectable in upper respiratory specimens during the acute phase of infection. The lowest concentration of SARS-CoV-2 viral copies this assay can detect is 138 copies/mL. A negative result does not preclude SARS-Cov-2 infection and should not be used as the sole basis for treatment or other patient management decisions. A negative result may occur with  improper specimen collection/handling, submission of specimen other than nasopharyngeal swab, presence of viral mutation(s) within the areas targeted by this assay, and inadequate number of viral copies(<138 copies/mL). A negative result must be combined with clinical observations, patient history, and epidemiological information. The expected result is Negative.  Fact Sheet for Patients:  BloggerCourse.com  Fact Sheet for Healthcare Providers:   SeriousBroker.it  This test is no t yet approved or cleared by the [place]  FDA and  has been authorized for detection and/or diagnosis of SARS-CoV-2 by FDA under an Emergency Use Authorization (EUA). This EUA will remain  in effect (meaning this test can be used) for the duration of the COVID-19 declaration under Section 564(b)(1) of  the Act, 21 U.S.C.section 360bbb-3(b)(1), unless the authorization is terminated  or revoked sooner.       Influenza A by PCR NEGATIVE NEGATIVE Final   Influenza B by PCR NEGATIVE NEGATIVE Final    Comment: (NOTE) The Xpert Xpress SARS-CoV-2/FLU/RSV plus assay is intended as an aid in the diagnosis of influenza from Nasopharyngeal swab specimens and should not be used as a sole basis for treatment. Nasal washings and aspirates are unacceptable for Xpert Xpress SARS-CoV-2/FLU/RSV testing.  Fact Sheet for Patients: BloggerCourse.com  Fact Sheet for Healthcare Providers: SeriousBroker.it  This test is not yet approved or cleared by the [place]  FDA and has been authorized for detection and/or diagnosis of SARS-CoV-2 by FDA under an Emergency Use Authorization (EUA). This EUA will remain in effect (meaning this test can be used) for the duration of the COVID-19 declaration under Section 564(b)(1) of the Act, 21 U.S.C. section 360bbb-3(b)(1), unless the authorization is terminated or revoked.     Resp Syncytial Virus by PCR NEGATIVE NEGATIVE Final    Comment: (NOTE) Fact Sheet for Patients: BloggerCourse.com  Fact Sheet for Healthcare Providers: SeriousBroker.it  This test is not yet approved or cleared by the [place]  FDA and has been authorized for detection and/or diagnosis of SARS-CoV-2 by FDA under an Emergency Use Authorization (EUA). This EUA will remain in effect (meaning this test can be used) for  the duration of the COVID-19 declaration under Section 564(b)(1) of the Act, 21 U.S.C. section 360bbb-3(b)(1), unless the authorization is terminated or revoked.  Performed at [facility], [address], [place], [place] [address]   C Difficile Quick Screen w PCR reflex     Status: Abnormal   Collection Time: 05/22/24  8:36 AM   Specimen: Stool  Result Value Ref Range Status   C Diff antigen POSITIVE (A) NEGATIVE Final   C Diff toxin NEGATIVE NEGATIVE Final   C Diff interpretation Results are indeterminate. See PCR results.  Final    Comment: Performed at [facility], [address], [place], [place] [address]  C. Diff by PCR, Reflexed     Status: Abnormal   Collection Time: 05/22/24  8:36 AM  Result Value Ref Range Status   Toxigenic C. Difficile by PCR POSITIVE (A) NEGATIVE Final    Comment: Positive for toxigenic C. difficile with little to no toxin production. Only treat if clinical presentation suggests symptomatic illness.   Hypervirulent Strain PRESUMPTIVE NEGATIVE PRESUMPTIVE NEGATIVE Final    Comment: Performed at [facility], [address] [place], [place] [address]         Radiology Studies: ECHOCARDIOGRAM COMPLETE Result Date: 05/22/2024    ECHOCARDIOGRAM REPORT   Patient Name:   BRIYA LOOKABAUGH Date of Exam: 05/22/2024 Medical Rec #:  [id number]       Height:       64.0 in Accession #:    [id number]      Weight:       137.6 lb Date of Birth:  16-Feb-1941       BSA:          1.669 m Patient Age:    83 years        BP:           157/90 mmHg Patient Gender: F               HR:           72 bpm. Exam Location:  Inpatient Procedure:  2D Echo, Cardiac Doppler and Color Doppler (Both Spectral and Color            Flow Doppler were utilized during procedure). Indications:    Syncope  History:        Patient has no prior history of Echocardiogram examinations.                 Signs/Symptoms:Syncope; Risk Factors:Dyslipidemia and                  Hypertension.  Sonographer:    BERNARDA ROCKS Referring Phys: [id number] ALLISON WOLFE IMPRESSIONS  1. Left ventricular ejection fraction, by estimation, is 70 to 75%. The left ventricle has hyperdynamic function. Small intracavitary gradient of 14 mm Hg at rest and 25 mm Hg at Valsalva. The left ventricle has no regional wall motion abnormalities. Left ventricular diastolic parameters are consistent with Grade I diastolic dysfunction (impaired relaxation). Elevated left ventricular end-diastolic pressure.  2. Right ventricular systolic function is normal. The right ventricular size is normal. Tricuspid regurgitation signal is inadequate for assessing PA pressure.  3. The mitral valve is normal in structure. No evidence of mitral valve regurgitation. No evidence of mitral stenosis.  4. The aortic valve has an indeterminant number of cusps. There is moderate calcification of the aortic valve. Aortic valve regurgitation is not visualized. No aortic stenosis is present. Aortic valve area, by VTI measures 1.98 cm. Aortic valve mean gradient measures 4.0 mmHg. Aortic valve Vmax measures 1.36 m/s.  5. The inferior vena cava is normal in size with greater than 50% respiratory variability, suggesting right atrial pressure of 3 mmHg. Comparison(s): No prior Echocardiogram. FINDINGS  Left Ventricle: Left ventricular ejection fraction, by estimation, is 70 to 75%. The left ventricle has hyperdynamic function. The left ventricle has no regional wall motion abnormalities. Strain was performed and the global longitudinal strain is indeterminate. The left ventricular internal cavity size was normal in size. There is no left ventricular hypertrophy. Left ventricular diastolic parameters are consistent with Grade I diastolic dysfunction (impaired relaxation). Elevated left ventricular end-diastolic pressure. Right Ventricle: The right ventricular size is normal. No increase in right ventricular wall thickness. Right ventricular  systolic function is normal. Tricuspid regurgitation signal is inadequate for assessing PA pressure. Left Atrium: Left atrial size was normal in size. Right Atrium: Right atrial size was normal in size. Pericardium: There is no evidence of pericardial effusion. Mitral Valve: The mitral valve is normal in structure. No evidence of mitral valve regurgitation. No evidence of mitral valve stenosis. MV peak gradient, 7.5 mmHg. The mean mitral valve gradient is 4.0 mmHg. Tricuspid Valve: The tricuspid valve is grossly normal. Tricuspid valve regurgitation is not demonstrated. No evidence of tricuspid stenosis. Aortic Valve: The aortic valve has an indeterminant number of cusps. There is moderate calcification of the aortic valve. Aortic valve regurgitation is not visualized. No aortic stenosis is present. Aortic valve mean gradient measures 4.0 mmHg. Aortic valve peak gradient measures 7.4 mmHg. Aortic valve area, by VTI measures 1.98 cm. Pulmonic Valve: The pulmonic valve was normal in structure. Pulmonic valve regurgitation is not visualized. No evidence of pulmonic stenosis. Aorta: The aortic root and ascending aorta are structurally normal, with no evidence of dilitation. Venous: The inferior vena cava is normal in size with greater than 50% respiratory variability, suggesting right atrial pressure of 3 mmHg. IAS/Shunts: No atrial level shunt detected by color flow Doppler. Additional Comments: 3D was performed not requiring image post processing on an independent workstation and was  indeterminate.  LEFT VENTRICLE PLAX 2D LVIDd:         3.00 cm     Diastology LVIDs:         1.80 cm     LV e' medial:    6.20 cm/s LV PW:         1.00 cm     LV E/e' medial:  16.0 LV IVS:        1.10 cm     LV e' lateral:   7.62 cm/s LVOT diam:     1.70 cm     LV E/e' lateral: 13.0 LV SV:         56 LV SV Index:   34 LVOT Area:     2.27 cm  LV Volumes (MOD) LV vol d, MOD A2C: 61.2 ml LV vol d, MOD A4C: 75.6 ml LV vol s, MOD A2C: 13.8 ml  LV vol s, MOD A4C: 26.4 ml LV SV MOD A2C:     47.4 ml LV SV MOD A4C:     75.6 ml LV SV MOD BP:      49.1 ml RIGHT VENTRICLE             IVC RV Basal diam:  2.80 cm     IVC diam: 1.90 cm RV S prime:     15.20 cm/s TAPSE (M-mode): 1.8 cm LEFT ATRIUM           Index        RIGHT ATRIUM          Index LA diam:      3.40 cm 2.04 cm/m   RA Area:     9.73 cm LA Vol (A2C): 34.7 ml 20.79 ml/m  RA Volume:   17.70 ml 10.61 ml/m LA Vol (A4C): 22.9 ml 13.72 ml/m  AORTIC VALVE                    PULMONIC VALVE AV Area (Vmax):    2.00 cm     PV Vmax:          1.14 m/s AV Area (Vmean):   1.83 cm     PV Peak grad:     5.2 mmHg AV Area (VTI):     1.98 cm     PR End Diast Vel: 0.90 msec AV Vmax:           136.00 cm/s AV Vmean:          92.300 cm/s AV VTI:            0.284 m AV Peak Grad:      7.4 mmHg AV Mean Grad:      4.0 mmHg LVOT Vmax:         120.00 cm/s LVOT Vmean:        74.600 cm/s LVOT VTI:          0.248 m LVOT/AV VTI ratio: 0.87  AORTA Ao Root diam: 2.50 cm Ao Asc diam:  2.90 cm MITRAL VALVE MV Area (PHT): 2.82 cm     SHUNTS MV Area VTI:   1.80 cm     Systemic VTI:  0.25 m MV Peak grad:  7.5 mmHg     Systemic Diam: 1.70 cm MV Mean grad:  4.0 mmHg MV Vmax:       1.37 m/s MV Vmean:      87.1 cm/s MV Decel Time: 269 msec MV E velocity: 99.10 cm/s MV A velocity: 145.00 cm/s MV E/A ratio:  0.68 Vishnu Priya Mallipeddi Electronically signed by Diannah Late Mallipeddi Signature Date/Time: 05/22/2024/3:02:39 PM    Final    CT CERVICAL SPINE WO CONTRAST Result Date: 05/21/2024 CLINICAL DATA:  Syncope. EXAM: CT CERVICAL SPINE WITHOUT CONTRAST TECHNIQUE: Multidetector CT imaging of the cervical spine was performed without intravenous contrast. Multiplanar CT image reconstructions were also generated. RADIATION DOSE REDUCTION: This exam was performed according to the departmental dose-optimization program which includes automated exposure control, adjustment of the mA and/or kV according to patient size and/or use of  iterative reconstruction technique. COMPARISON:  None Available. FINDINGS: Alignment: Normal. Skull base and vertebrae: No acute fracture. No primary bone lesion or focal pathologic process. Soft tissues and spinal canal: No prevertebral fluid or swelling. No visible canal hematoma. Disc levels: Moderate severity endplate sclerosis is seen at the level of C5-C6. Very mild endplate sclerosis is present throughout the remainder of the cervical spine. Moderate to marked severity intervertebral disc space narrowing is also noted at the level of C5-C6 with mild multilevel intervertebral disc space narrowing throughout the remainder of the cervical spine. Mild, bilateral multilevel facet joint hypertrophy is noted. Upper chest: Negative. Other: None. IMPRESSION: 1. No acute fracture or subluxation in the cervical spine. 2. 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 Electronically Signed   By: Suzen Dials M.D.   On: 05/21/2024 18:30   CT HEAD WO CONTRAST ( ) Result Date: 05/21/2024 CLINICAL DATA:  Syncopal episode. EXAM: CT HEAD WITHOUT CONTRAST TECHNIQUE: Contiguous axial images were obtained from the base of the skull through the vertex without intravenous contrast. RADIATION DOSE REDUCTION: This exam was performed according to the departmental dose-optimization program which includes automated exposure control, adjustment of the mA and/or kV according to patient size and/or use of iterative reconstruction technique. COMPARISON:  Jan 30, 2021 FINDINGS: Brain: There is generalized cerebral atrophy with widening of the extra-axial spaces and ventricular dilatation. There are areas of decreased attenuation within the white matter tracts of the supratentorial brain, consistent with microvascular disease changes. Vascular: Marked severity bilateral cavernous carotid artery calcification is noted. Skull: Normal. Negative for fracture or focal lesion. Sinuses/Orbits: No acute finding.  Other: None. IMPRESSION: 1. Generalized cerebral atrophy and microvascular disease changes of the supratentorial brain. 2. No acute intracranial abnormality. Electronically Signed   By: Suzen Dials M.D.   On: 05/21/2024 18:27   DG Cervical Spine 2 or 3 views Result Date: 05/21/2024 CLINICAL DATA:  Neck pain after fall. EXAM: CERVICAL SPINE - 2-3 VIEW COMPARISON:  None Available. FINDINGS: Only the first 5 cervical vertebra are adequately visualized. No fracture or spondylolisthesis is seen involving the visualized vertebra. Pathology involving C6 and C7 vertebra cannot be excluded on the basis of this exam. IMPRESSION: Only the first 5 cervical vertebra are adequately visualized. No fracture or spondylolisthesis is seen involving the visualized vertebra. Pathology involving C6 and C7 vertebra cannot be excluded on the basis of this exam. CT scan is recommended for further evaluation. Electronically Signed   By: Lynwood Landy Raddle M.D.   On: 05/21/2024 17:17   DG Shoulder Left Result Date: 05/21/2024 CLINICAL DATA:  Left shoulder pain after fall. EXAM: LEFT SHOULDER - 2+ VIEW COMPARISON:  None Available. FINDINGS: There is no evidence of fracture or dislocation. There is no evidence of arthropathy or other focal bone abnormality. Soft tissues are unremarkable. IMPRESSION: Negative. Electronically Signed   By: Lynwood Landy Raddle M.D.   On: 05/21/2024 17:15   CT CHEST ABDOMEN  PELVIS W CONTRAST Result Date: 05/21/2024 CLINICAL DATA:  Witnessed syncopal episode. Two-week of vomiting and diarrhea EXAM: CT CHEST, ABDOMEN, AND PELVIS WITH CONTRAST TECHNIQUE: Multidetector CT imaging of the chest, abdomen and pelvis was performed following the standard protocol during bolus administration of intravenous contrast. RADIATION DOSE REDUCTION: This exam was performed according to the departmental dose-optimization program which includes automated exposure control, adjustment of the mA and/or kV according to patient size  and/or use of iterative reconstruction technique. CONTRAST:  80mL OMNIPAQUE  IOHEXOL  300 MG/ML  SOLN COMPARISON:  CT chest, abdomen, and pelvis dated 05/17/2023 FINDINGS: CT CHEST FINDINGS Cardiovascular: Normal heart size. No significant pericardial fluid/thickening. Great vessels are normal in course and caliber. No central pulmonary emboli. Coronary artery calcifications. Mediastinum/Nodes: Imaged thyroid  gland without nodules meeting criteria for imaging follow-up by size. Normal esophagus. No pathologically enlarged axillary, supraclavicular, mediastinal, or hilar lymph nodes. Lungs/Pleura: The central airways are patent. No focal consolidation. No pneumothorax. No pleural effusion. Musculoskeletal: No acute or abnormal lytic or blastic osseous lesions. Multilevel degenerative changes of the thoracic spine. CT ABDOMEN PELVIS FINDINGS Hepatobiliary: No focal hepatic lesions. No intra or extrahepatic biliary ductal dilation. Cholecystectomy. Pancreas: No focal lesions or main ductal dilation. Spleen: Normal in size without focal abnormality. Adrenals/Urinary Tract: No adrenal nodules. No suspicious renal mass, calculi, or hydronephrosis. Unchanged exophytic right interpolar 1.5 cm simple cyst (2:61). No specific follow-up imaging recommended. No focal bladder wall thickening. Stomach/Bowel: Normal appearance of the stomach. No evidence of bowel wall thickening, distention, or inflammatory changes. Colonic diverticulosis without acute diverticulitis. Normal appendix. Vascular/Lymphatic: Aortic atherosclerosis. No enlarged abdominal or pelvic lymph nodes. Reproductive: No adnexal masses. Other: No free fluid, fluid collection, or free air. Musculoskeletal: No acute or abnormal lytic or blastic osseous findings. Multilevel degenerative changes of the lumbar spine. IMPRESSION: 1. No acute abnormality in the chest, abdomen, or pelvis. 2. Colonic diverticulosis without acute diverticulitis. 3. Aortic Atherosclerosis  (ICD10-I70.0). Coronary artery calcifications. Assessment for potential risk factor modification, dietary therapy or pharmacologic therapy may be warranted, if clinically indicated. Electronically Signed   By: Limin  Xu M.D.   On: 05/21/2024 14:34   DG Chest Port 1 View Result Date: 05/21/2024 CLINICAL DATA:  Syncope. EXAM: PORTABLE CHEST 1 VIEW COMPARISON:  05/01/2024. FINDINGS: Trachea is midline. Heart is mildly enlarged, stable. Thoracic aorta is calcified. Lungs are low in volume with minimal bibasilar atelectasis. No pleural fluid. IMPRESSION: Low lung volumes.  No acute findings. Electronically Signed   By: Newell Eke M.D.   On: 05/21/2024 14:03        Scheduled Meds:  amLODipine   10 mg Oral Daily   donepezil   10 mg Oral Daily   enoxaparin  (LOVENOX ) injection  30 mg Subcutaneous Q24H   levothyroxine   75 mcg Oral Q0600   lidocaine   1 patch Transdermal Q24H   mirtazapine   15 mg Oral QHS   pantoprazole   40 mg Oral Daily   potassium chloride   40 mEq Oral Q4H   rOPINIRole   0.25 mg Oral QHS   rosuvastatin   10 mg Oral Daily   sucralfate   1 g Oral QID   vancomycin   125 mg Oral QID   Continuous Infusions:  cefTRIAXone  (ROCEPHIN )  IV 1 g (05/22/24 2206)     LOS: 2 days    Time spent: 35 minutes    Danesha Kirchoff A Brenton Joines, MD [facility]   If 7PM-7AM, please contact night-coverage [email or web address]  05/23/2024, 8:23 AM

## 2024-05-23 NOTE — TOC Initial Note (Signed)
 Transition of Care [facility]) - Initial/Assessment Note    Patient Details  Name: Elizabeth Mcintyre MRN: [id number] Date of Birth: October 14, 1940  Transition of Care [facility]) CM/SW Contact:    [name], LCSW Phone Number: 05/23/2024, 12:59 PM  Clinical Narrative:                  CSW spoke with pt's daughter and discussed recommendation for short term rehab. Pt's daughter is agreeable and would like pt to be [facility] in [facility]. CSW explained the process and informed her that the pt will need insurance authorization. CSW to fax pt out for SNF placement. IP care management to follow.     Expected Discharge Plan: Skilled Nursing Facility Barriers to Discharge: Continued Medical Work up   Patient Goals and CMS Choice Patient states their goals for this hospitalization and ongoing recovery are:: SNF to get stonger CMS Medicare.gov Compare Post Acute Care list provided to:: Patient Represenative (must comment) Choice offered to / list presented to : Adult Children      Expected Discharge Plan and Services                                              Prior Living Arrangements/Services   Lives with:: Self                   Activities of Daily Living   ADL Screening (condition at time of admission) Independently performs ADLs?: Yes (appropriate for developmental age) Is the patient deaf or have difficulty hearing?: No Does the patient have difficulty seeing, even when wearing glasses/contacts?: No Does the patient have difficulty concentrating, remembering, or making decisions?: Yes  Permission Sought/Granted                  Emotional Assessment              Admission diagnosis:  Severe sepsis (HCC) [A41.9, R65.20] Urinary tract infection with hematuria, site unspecified [N39.0, R31.9] Sepsis, due to unspecified organism, unspecified whether acute organ dysfunction present [facility]) [A41.9] Patient Active Problem List   Diagnosis Date Noted    Severe sepsis from UTI 05/21/2024   Acute pain of left shoulder 05/21/2024   Moderate Alzheimer's dementia (HCC) 05/10/2024   Hospital discharge follow-up 05/10/2024   Gastroesophageal reflux disease 05/01/2024   Hypothyroidism 05/01/2024   Peripheral neuropathy 05/01/2024   Acute kidney injury superimposed on chronic kidney disease (HCC) 05/01/2024   Hyponatremia 05/01/2024   UTI (urinary tract infection) 06/17/2023   Nausea and vomiting 05/18/2023   Chest pain 05/18/2023   Hypokalemia 05/18/2023   QT prolongation 05/18/2023   Syncope 05/18/2023   Suspected COVID-19 virus infection 05/08/2023   Prediabetes 02/20/2023   Stress incontinence of urine 02/19/2023   Postherpetic neuralgia 01/22/2023   Mild dementia, unclear etiology 11/12/2022   Lacunar infarction    Acute cystitis without hematuria 10/09/2022   Encounter for general adult medical examination with abnormal findings 05/05/2022   Acute pain of right shoulder 04/16/2022   Restless legs syndrome 01/02/2021   Mixed hyperlipidemia 08/14/2020   Dysphagia 04/18/2020   Port-A-Cath in place 03/28/2020   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   Hepatic steatosis 01/04/2020   Splenomegaly 01/04/2020   Thrombocytopenia 01/04/2020   Diarrhea 09/10/2018   Carotid artery stenosis 06/13/2016   Postural dizziness 06/06/2016   Stage 3a chronic kidney disease 02/14/2016  Vitamin D  deficiency 05/25/2015   Gastroesophageal reflux disease with esophagitis 03/06/2015   Osteopenia 12/02/2013   Atypical chest pain 07/22/2011   Major depression, recurrent, chronic (HCC) 04/03/2011   Dyslipidemia 11/09/2007   Mild intermittent asthma 11/09/2007   Essential hypertension 11/09/2007   PCP:  [name], MD Pharmacy:   [id number] [place], [place] - [address] [place] [address] Phone: [phone] Fax: [phone]     Social Drivers of Health (SDOH) Social  History: SDOH Screenings   Food Insecurity: No Food Insecurity (05/21/2024)  Housing: Low Risk  (05/21/2024)  Transportation Needs: No Transportation Needs (05/21/2024)  Utilities: Not At Risk (05/21/2024)  Alcohol Screen: Low Risk  (06/04/2021)  Depression (PHQ2-9): Low Risk  (08/03/2023)  Financial Resource Strain: Low Risk  (06/04/2021)  Physical Activity: Insufficiently Active (05/27/2023)  Social Connections: Moderately Integrated (05/21/2024)  Stress: Stress Concern Present (05/27/2023)  Tobacco Use: Low Risk  (05/23/2024)   SDOH Interventions:     Readmission Risk Interventions    05/19/2023    2:38 PM  Readmission Risk Prevention Plan  Post Dischage Appt Complete  Medication Screening Complete  Transportation Screening Complete

## 2024-05-24 ENCOUNTER — Telehealth (HOSPITAL_COMMUNITY): Payer: Self-pay | Admitting: Pharmacy Technician

## 2024-05-24 ENCOUNTER — Other Ambulatory Visit (HOSPITAL_COMMUNITY): Payer: Self-pay

## 2024-05-24 DIAGNOSIS — R6520 Severe sepsis without septic shock: Secondary | ICD-10-CM | POA: Diagnosis not present

## 2024-05-24 DIAGNOSIS — A419 Sepsis, unspecified organism: Secondary | ICD-10-CM | POA: Diagnosis not present

## 2024-05-24 LAB — CBC WITH DIFFERENTIAL/PLATELET
Abs Immature Granulocytes: 0.03 K/uL (ref 0.00–0.07)
Basophils Absolute: 0.1 K/uL (ref 0.0–0.1)
Basophils Relative: 1 %
Eosinophils Absolute: 0.8 K/uL — ABNORMAL HIGH (ref 0.0–0.5)
Eosinophils Relative: 8 %
HCT: 39.9 % (ref 36.0–46.0)
Hemoglobin: 13.5 g/dL (ref 12.0–15.0)
Immature Granulocytes: 0 %
Lymphocytes Relative: 37 %
Lymphs Abs: 3.5 K/uL (ref 0.7–4.0)
MCH: 30.2 pg (ref 26.0–34.0)
MCHC: 33.8 g/dL (ref 30.0–36.0)
MCV: 89.3 fL (ref 80.0–100.0)
Monocytes Absolute: 0.8 K/uL (ref 0.1–1.0)
Monocytes Relative: 8 %
Neutro Abs: 4.3 K/uL (ref 1.7–7.7)
Neutrophils Relative %: 46 %
Platelets: 234 K/uL (ref 150–400)
RBC: 4.47 MIL/uL (ref 3.87–5.11)
RDW: 13.3 % (ref 11.5–15.5)
WBC: 9.5 K/uL (ref 4.0–10.5)
nRBC: 0 % (ref 0.0–0.2)

## 2024-05-24 LAB — BASIC METABOLIC PANEL WITH GFR
Anion gap: 16 — ABNORMAL HIGH (ref 5–15)
BUN: 9 mg/dL (ref 8–23)
CO2: 21 mmol/L — ABNORMAL LOW (ref 22–32)
Calcium: 9.2 mg/dL (ref 8.9–10.3)
Chloride: 106 mmol/L (ref 98–111)
Creatinine, Ser: 1.15 mg/dL — ABNORMAL HIGH (ref 0.44–1.00)
GFR, Estimated: 47 mL/min — ABNORMAL LOW (ref >60)
Glucose, Bld: 105 mg/dL — ABNORMAL HIGH (ref 70–99)
Potassium: 3.3 mmol/L — ABNORMAL LOW (ref 3.5–5.1)
Sodium: 143 mmol/L (ref 135–145)

## 2024-05-24 LAB — GASTROINTESTINAL PANEL BY PCR, STOOL (REPLACES STOOL CULTURE)

## 2024-05-24 LAB — MAGNESIUM: Magnesium: 2 mg/dL (ref 1.7–2.4)

## 2024-05-24 MED ORDER — ENOXAPARIN SODIUM 40 MG/0.4ML IJ SOSY
40.0000 mg | PREFILLED_SYRINGE | INTRAMUSCULAR | Status: DC
  Administered 2024-05-24 – 2024-05-27 (×4): 40 mg via SUBCUTANEOUS
  Filled 2024-05-24 (×4): qty 0.4

## 2024-05-24 MED ORDER — POTASSIUM CHLORIDE 10 MEQ/100ML IV SOLN
10.0000 meq | INTRAVENOUS | Status: DC
  Administered 2024-05-24: 10 meq via INTRAVENOUS
  Filled 2024-05-24: qty 100

## 2024-05-24 MED ORDER — POTASSIUM CHLORIDE CRYS ER 20 MEQ PO TBCR
40.0000 meq | EXTENDED_RELEASE_TABLET | Freq: Once | ORAL | Status: AC
  Administered 2024-05-24: 40 meq via ORAL
  Filled 2024-05-24: qty 2

## 2024-05-24 NOTE — Telephone Encounter (Signed)
 Patient Product/process development scientist completed.    The patient is insured through [employer]/ [employer]. Patient has Medicare and is not eligible for a copay card, but may be able to apply for patient assistance or Medicare RX Payment Plan (Patient Must reach out to their plan, if eligible for payment plan), if available.    Ran test claim for vancomycin 125 mg and the current 10 day co-pay is $36.99.   This test claim was processed through [facility]- copay amounts may vary at other pharmacies due to pharmacy/plan contracts, or as the patient moves through the different stages of their insurance plan.     Roland Earl, CPHT Pharmacy Technician III Certified Patient Advocate [facility] Patient Advocate Team Direct Number: [phone]  Fax: [phone]

## 2024-05-24 NOTE — TOC Transition Note (Addendum)
 Transition of Care [facility]) - Discharge Note   Patient Details  Name: Elizabeth Mcintyre MRN: [id number] Date of Birth: 30-Sep-1940  Transition [facility]) CM/SW Contact:  [name], LCSW Phone Number: 05/24/2024, 1:29 PM   Clinical Narrative:     CSW emailed pt's bed offers for SNF placement to pt's daughter Elizabeth Mcintyre for review. IPCM to follow.   Adden  4:00pm CSw spoke with pt's daughter to discuss facility choice. Pt's daughter has chosen [facility]. CSW attempted to contact [employer] to start insurance authorization, no answer left VM requesting a return call. IP care management to follow.    Barriers to Discharge: Continued Medical Work up   Patient Goals and CMS Choice Patient states their goals for this hospitalization and ongoing recovery are:: SNF to get stonger CMS Medicare.gov Compare Post Acute Care list provided to:: Patient Represenative (must comment) Choice offered to / list presented to : Adult Children      Discharge Placement                       Discharge Plan and Services Additional resources added to the After Visit Summary for                                       Social Drivers of Health (SDOH) Interventions SDOH Screenings   Food Insecurity: No Food Insecurity (05/21/2024)  Housing: Low Risk  (05/21/2024)  Transportation Needs: No Transportation Needs (05/21/2024)  Utilities: Not At Risk (05/21/2024)  Alcohol Screen: Low Risk  (06/04/2021)  Depression (PHQ2-9): Low Risk  (08/03/2023)  Financial Resource Strain: Low Risk  (06/04/2021)  Physical Activity: Insufficiently Active (05/27/2023)  Social Connections: Moderately Integrated (05/21/2024)  Stress: Stress Concern Present (05/27/2023)  Tobacco Use: Low Risk  (05/23/2024)     Readmission Risk Interventions    05/19/2023    2:38 PM  Readmission Risk Prevention Plan  Post Dischage Appt Complete  Medication Screening Complete  Transportation Screening Complete

## 2024-05-24 NOTE — Progress Notes (Signed)
 PROGRESS NOTE    Elizabeth Mcintyre  FMW:[id number] DOB: [birth year] DOA: 05/21/2024 PCP: [name], MD   Brief Narrative: 83 year old with past medical history significant for asthma, hypertension, hypothyroidism, dementia, B12 deficiency, RLS, peripheral neuropathy, GERD, gout, hyperlipidemia, carotid artery stenosis presents complaining of syncope and history of vomiting and diarrhea for 2 weeks.  Patient also has been having chest discomfort for the last 2 or 3 weeks.  She has a history of peptic ulcer disease and used to take Carafate .  She is compliant taking her PPI at home.  UA was concerning for infection and she was admitted for further workup.  Patient was found to have C. difficile colitis.  In regard to her chest pain, could be  GI in origin related to her reflux.  She was started on Carafate   and PPI. Diarrhea improving.   Assessment & Plan:   Principal Problem:   Severe sepsis from UTI Active Problems:   Syncope   Acute pain of left shoulder   Diarrhea   Essential hypertension   Mild intermittent asthma   Mild dementia, unclear etiology   Hypothyroidism   Gastroesophageal reflux disease   Dyslipidemia   Stage 3a chronic kidney disease   Restless legs syndrome   UTI (urinary tract infection)   1-Severe Sepsis UTI Present with tachycardia lactic acidosis 4.8, hypothermia.  Source of infection UTI versus C. difficile colitis. -Plan to treat for UTI for 3 days due to active CAD - Urine culture growing 70,000 colony of E. coli and 3000 of Streptococcus agalactiae. -Completed 3 days of IV ceftriaxone .   Syncope - Suspect related to hypovolemia in the setting of dehydration diarrhea and poor oral intake -Echo normal ejection fraction -Received IV fluids.  Monitor on telemetry   Diarrhea, C. difficile colitis: Also Norovirus positive.  C diff antigen  positive and PCR>  Started on Oral Vancomycin .  Diarrhea improving.   History of PUD GERD;  Report belching  and chest pain.  Chest pain likely related to Dyspepsia/ ECHO normal. Troponin negative Started on Carafate  and continue PPI daily.  Of note, she is S/P cholecystectomy 2018.  CT abdomen pelvis no biliary duct dilation.   Acute pain in the left shoulder Continue with lidocaine .   Hypertension: Continue amlodipine   Mild intermittent asthma - Continue nebulizers as needed  Mild dementia; Continue Aricept  and mirtazapine   Hypothyroidism Continue Synthroid   Dyslipidemia - Continue Crestor   CKD 3 A: - Baseline creatinine around 1.2--- 1.5 Stable  RLS Continue Requip   Hypokalemia: Replete orally   Estimated body mass index is 23.61 kg/m as calculated from the following:   Height as of this encounter: 5' 4 (1.626 m).   Weight as of this encounter: 62.4 kg.   DVT prophylaxis: Lovenox  Code Status: Full code Family Communication: Care discussed with family at bedside 9/08 Disposition Plan:  Status is: Inpatient Remains inpatient appropriate because: Management of diarrhea, rehab 9/10 if symptoms improved.     Consultants:  none  Procedures:  Echo  Antimicrobials:    Subjective: She is feeling better, per nurse report patient having less diarrhea.  Patient also report improvement of belching.   Objective: Vitals:   05/23/24 1402 05/23/24 1954 05/24/24 0457 05/24/24 1254  BP: 132/60 138/63 (!) 153/69 131/65  Pulse: 77 75 67 72  Resp: 16 16 18    Temp: 98 F (36.7 C) 98.1 F (36.7 C) 98.6 F (37 C) 97.6 F (36.4 C)  TempSrc: Oral Oral  Oral  SpO2: 100% 92%  98% 96%  Weight:      Height:        Intake/Output Summary (Last 24 hours) at 05/24/2024 1618 Last data filed at 05/24/2024 0300 Gross per 24 hour  Intake 900.52 ml  Output 650 ml  Net 250.52 ml   Filed Weights   05/21/24 2300  Weight: 62.4 kg    Examination:  General exam: NAD Respiratory system: CTA Cardiovascular system: S 1, S 2 RRR Gastrointestinal system: BS present, soft,  nt Central nervous system: Alert Extremities: Symmetric 5 x 5 power.    Data Reviewed: I have personally reviewed following labs and imaging studies  CBC: Recent Labs  Lab 05/21/24 1249 05/22/24 0306 05/23/24 0407 05/24/24 0412  WBC 10.9* 13.6* 7.7 9.5  NEUTROABS 7.6  --  3.3 4.3  HGB 14.8 13.4 14.0 13.5  HCT 42.2 38.8 40.3 39.9  MCV 85.9 86.8 85.2 89.3  PLT 264 231 149* 234   Basic Metabolic Panel: Recent Labs  Lab 05/21/24 1249 05/21/24 1539 05/22/24 0306 05/23/24 0654 05/24/24 0412  NA 138  --  140 141 143  K 3.7  --  3.8 2.7* 3.3*  CL 99  --  100 103 106  CO2 20*  --  24 21* 21*  GLUCOSE 219*  --  127* 180* 105*  BUN 22  --  14 11 9   CREATININE 1.35*  --  1.09* 1.25* 1.15*  CALCIUM  10.4*  --  9.5 8.9 9.2  MG  --  1.9  --  1.9 2.0   GFR: Estimated Creatinine Clearance: 32.6 mL/min (A) (by C-G formula based on SCr of 1.15 mg/dL (H)). Liver Function Tests: Recent Labs  Lab 05/21/24 1249 05/22/24 0306  AST 43* 40  ALT 56* 46*  ALKPHOS 73 65  BILITOT 0.7 0.6  PROT 7.2 6.4*  ALBUMIN 4.6 4.2   Recent Labs  Lab 05/21/24 1249 05/22/24 0306  LIPASE 99* 37   No results for input(s): AMMONIA in the last 168 hours. Coagulation Profile: Recent Labs  Lab 05/21/24 1306  INR 0.9   Cardiac Enzymes: No results for input(s): CKTOTAL, CKMB, CKMBINDEX, TROPONINI in the last 168 hours. BNP (last 3 results) No results for input(s): PROBNP in the last 8760 hours. HbA1C: No results for input(s): HGBA1C in the last 72 hours. CBG: No results for input(s): GLUCAP in the last 168 hours. Lipid Profile: No results for input(s): CHOL, HDL, LDLCALC, TRIG, CHOLHDL, LDLDIRECT in the last 72 hours. Thyroid  Function Tests: No results for input(s): TSH, T4TOTAL, FREET4, T3FREE, THYROIDAB in the last 72 hours. Anemia Panel: Recent Labs    05/21/24 1950  FERRITIN 390*   Sepsis Labs: Recent Labs  Lab 05/21/24 1539 05/21/24 1545  05/21/24 1732 05/21/24 1951 05/22/24 0306  PROCALCITON <0.10  --   --   --  0.20  LATICACIDVEN  --  4.8* 4.0* 2.9* 1.7    Recent Results (from the past 240 hours)  Urine Culture     Status: Abnormal   Collection Time: 05/21/24 12:41 PM   Specimen: Urine, Random  Result Value Ref Range Status   Specimen Description   Final    URINE, RANDOM Performed at [facility], [address], [place], [place] [address]    Special Requests   Final    NONE Reflexed from [id number] Performed at [facility], [address] [place], [place] [address]    Culture (A)  Final    70,000 COLONIES/mL ESCHERICHIA COLI 30,000 COLONIES/mL STREPTOCOCCUS  AGALACTIAE TESTING AGAINST S. AGALACTIAE NOT ROUTINELY PERFORMED DUE TO PREDICTABILITY OF AMP/PEN/VAN SUSCEPTIBILITY. Performed at [facility], [address] [place], [place] [address]    Report Status 05/23/2024 FINAL  Final   Organism ID, Bacteria ESCHERICHIA COLI (A)  Final      Susceptibility   Escherichia coli - MIC*    AMPICILLIN <=2 SENSITIVE Sensitive     CEFAZOLIN  (URINE) Value in next row Sensitive      <=1 SENSITIVEThis is a modified FDA-approved test that has been validated and its performance characteristics determined by the reporting laboratory.  This laboratory is certified under the Clinical Laboratory Improvement Amendments CLIA as qualified to perform high complexity clinical laboratory testing.    CEFEPIME  Value in next row Sensitive      <=1 SENSITIVEThis is a modified FDA-approved test that has been validated and its performance characteristics determined by the reporting laboratory.  This laboratory is certified under the Clinical Laboratory Improvement Amendments CLIA as qualified to perform high complexity clinical laboratory testing.    ERTAPENEM Value in next row Sensitive      <=1 SENSITIVEThis is a modified FDA-approved test that has been validated and its performance characteristics  determined by the reporting laboratory.  This laboratory is certified under the Clinical Laboratory Improvement Amendments CLIA as qualified to perform high complexity clinical laboratory testing.    CEFTRIAXONE  Value in next row Sensitive      <=1 SENSITIVEThis is a modified FDA-approved test that has been validated and its performance characteristics determined by the reporting laboratory.  This laboratory is certified under the Clinical Laboratory Improvement Amendments CLIA as qualified to perform high complexity clinical laboratory testing.    CIPROFLOXACIN  Value in next row Sensitive      <=1 SENSITIVEThis is a modified FDA-approved test that has been validated and its performance characteristics determined by the reporting laboratory.  This laboratory is certified under the Clinical Laboratory Improvement Amendments CLIA as qualified to perform high complexity clinical laboratory testing.    GENTAMICIN Value in next row Sensitive      <=1 SENSITIVEThis is a modified FDA-approved test that has been validated and its performance characteristics determined by the reporting laboratory.  This laboratory is certified under the Clinical Laboratory Improvement Amendments CLIA as qualified to perform high complexity clinical laboratory testing.    NITROFURANTOIN Value in next row Sensitive      <=1 SENSITIVEThis is a modified FDA-approved test that has been validated and its performance characteristics determined by the reporting laboratory.  This laboratory is certified under the Clinical Laboratory Improvement Amendments CLIA as qualified to perform high complexity clinical laboratory testing.    TRIMETH /SULFA  Value in next row Sensitive      <=1 SENSITIVEThis is a modified FDA-approved test that has been validated and its performance characteristics determined by the reporting laboratory.  This laboratory is certified under the Clinical Laboratory Improvement Amendments CLIA as qualified to perform high  complexity clinical laboratory testing.    AMPICILLIN/SULBACTAM Value in next row Sensitive      <=1 SENSITIVEThis is a modified FDA-approved test that has been validated and its performance characteristics determined by the reporting laboratory.  This laboratory is certified under the Clinical Laboratory Improvement Amendments CLIA as qualified to perform high complexity clinical laboratory testing.    PIP/TAZO Value in next row Sensitive ug/mL     <=4 SENSITIVEThis is a modified FDA-approved test that has been validated and its performance characteristics determined by the reporting laboratory.  This  laboratory is certified under the Clinical Laboratory Improvement Amendments CLIA as qualified to perform high complexity clinical laboratory testing.    MEROPENEM Value in next row Sensitive      <=4 SENSITIVEThis is a modified FDA-approved test that has been validated and its performance characteristics determined by the reporting laboratory.  This laboratory is certified under the Clinical Laboratory Improvement Amendments CLIA as qualified to perform high complexity clinical laboratory testing.    * 70,000 COLONIES/mL ESCHERICHIA COLI  Blood Culture (routine x 2)     Status: None (Preliminary result)   Collection Time: 05/21/24 12:49 PM   Specimen: BLOOD  Result Value Ref Range Status   Specimen Description   Final    BLOOD RIGHT ANTECUBITAL Performed at [facility], [address] [place], [place] [address]    Special Requests   Final    BOTTLES DRAWN AEROBIC AND ANAEROBIC Blood Culture adequate volume Performed at [facility], [address] [place], [place] [address]    Culture   Final    NO GROWTH 3 DAYS Performed at [facility], [address] [place], [place] [address]    Report Status PENDING  Incomplete  Blood Culture (routine x 2)     Status: None (Preliminary result)   Collection Time: 05/21/24  1:25 PM   Specimen: BLOOD   Result Value Ref Range Status   Specimen Description   Final    BLOOD LEFT ANTECUBITAL Performed at [facility], [address], [place], [place] [address]    Special Requests   Final    BOTTLES DRAWN AEROBIC ONLY Blood Culture results may not be optimal due to an inadequate volume of blood received in culture bottles Performed at [facility], [address] [place], [place] [address]    Culture   Final    NO GROWTH 3 DAYS Performed at [facility], [address], [place], [place] [address]    Report Status PENDING  Incomplete  Resp panel by RT-PCR (RSV, Flu A&B, Covid) Anterior Nasal Swab     Status: None   Collection Time: 05/21/24  1:34 PM   Specimen: Anterior Nasal Swab  Result Value Ref Range Status   SARS Coronavirus 2 by RT PCR NEGATIVE NEGATIVE Final    Comment: (NOTE) SARS-CoV-2 target nucleic acids are NOT DETECTED.  The SARS-CoV-2 RNA is generally detectable in upper respiratory specimens during the acute phase of infection. The lowest concentration of SARS-CoV-2 viral copies this assay can detect is 138 copies/mL. A negative result does not preclude SARS-Cov-2 infection and should not be used as the sole basis for treatment or other patient management decisions. A negative result may occur with  improper specimen collection/handling, submission of specimen other than nasopharyngeal swab, presence of viral mutation(s) within the areas targeted by this assay, and inadequate number of viral copies(<138 copies/mL). A negative result must be combined with clinical observations, patient history, and epidemiological information. The expected result is Negative.  Fact Sheet for Patients:  BloggerCourse.com  Fact Sheet for Healthcare Providers:  SeriousBroker.it  This test is no t yet approved or cleared by the [place]  FDA and  has been authorized for detection and/or  diagnosis of SARS-CoV-2 by FDA under an Emergency Use Authorization (EUA). This EUA will remain  in effect (meaning this test can be used) for the duration of the COVID-19 declaration under Section 564(b)(1) of the Act, 21 U.S.C.section 360bbb-3(b)(1), unless the authorization is  terminated  or revoked sooner.       Influenza A by PCR NEGATIVE NEGATIVE Final   Influenza B by PCR NEGATIVE NEGATIVE Final    Comment: (NOTE) The Xpert Xpress SARS-CoV-2/FLU/RSV plus assay is intended as an aid in the diagnosis of influenza from Nasopharyngeal swab specimens and should not be used as a sole basis for treatment. Nasal washings and aspirates are unacceptable for Xpert Xpress SARS-CoV-2/FLU/RSV testing.  Fact Sheet for Patients: BloggerCourse.com  Fact Sheet for Healthcare Providers: SeriousBroker.it  This test is not yet approved or cleared by the [place]  FDA and has been authorized for detection and/or diagnosis of SARS-CoV-2 by FDA under an Emergency Use Authorization (EUA). This EUA will remain in effect (meaning this test can be used) for the duration of the COVID-19 declaration under Section 564(b)(1) of the Act, 21 U.S.C. section 360bbb-3(b)(1), unless the authorization is terminated or revoked.     Resp Syncytial Virus by PCR NEGATIVE NEGATIVE Final    Comment: (NOTE) Fact Sheet for Patients: BloggerCourse.com  Fact Sheet for Healthcare Providers: SeriousBroker.it  This test is not yet approved or cleared by the [place]  FDA and has been authorized for detection and/or diagnosis of SARS-CoV-2 by FDA under an Emergency Use Authorization (EUA). This EUA will remain in effect (meaning this test can be used) for the duration of the COVID-19 declaration under Section 564(b)(1) of the Act, 21 U.S.C. section 360bbb-3(b)(1), unless the authorization is terminated  or revoked.  Performed at [facility], [address] [place], [place] [address]   Gastrointestinal Panel by PCR , Stool     Status: Abnormal   Collection Time: 05/22/24  8:36 AM   Specimen: Stool  Result Value Ref Range Status   Campylobacter species NOT DETECTED NOT DETECTED Final   Plesimonas shigelloides NOT DETECTED NOT DETECTED Final   Salmonella species NOT DETECTED NOT DETECTED Final   Yersinia enterocolitica NOT DETECTED NOT DETECTED Final   Vibrio species NOT DETECTED NOT DETECTED Final   Vibrio cholerae NOT DETECTED NOT DETECTED Final   Enteroaggregative E coli (EAEC) NOT DETECTED NOT DETECTED Final   Enteropathogenic E coli (EPEC) NOT DETECTED NOT DETECTED Final   Enterotoxigenic E coli (ETEC) NOT DETECTED NOT DETECTED Final   Shiga like toxin producing E coli (STEC) NOT DETECTED NOT DETECTED Final   Shigella/Enteroinvasive E coli (EIEC) NOT DETECTED NOT DETECTED Final   Cryptosporidium NOT DETECTED NOT DETECTED Final   Cyclospora cayetanensis NOT DETECTED NOT DETECTED Final   Entamoeba histolytica NOT DETECTED NOT DETECTED Final   Giardia lamblia NOT DETECTED NOT DETECTED Final   Adenovirus F40/41 NOT DETECTED NOT DETECTED Final   Astrovirus NOT DETECTED NOT DETECTED Final   Norovirus GI/GII DETECTED (A) NOT DETECTED Final    Comment: CRITICAL RESULT CALLED TO, READ BACK BY AND VERIFIED WITH: HEATHER BULLINS 05/24/24 1205 TDL    Rotavirus A NOT DETECTED NOT DETECTED Final   Sapovirus (I, II, IV, and V) NOT DETECTED NOT DETECTED Final    Comment: Performed at [facility], [address] [place], [place] [address]  C Difficile Quick Screen w PCR reflex     Status: Abnormal   Collection Time: 05/22/24  8:36 AM   Specimen: Stool  Result Value Ref Range Status   C Diff antigen POSITIVE (A) NEGATIVE Final   C Diff toxin NEGATIVE NEGATIVE Final   C Diff interpretation Results are indeterminate. See PCR results.  Final    Comment: Performed  at [facility], [address] [place], [place] [address]  C. Diff by PCR, Reflexed     Status: Abnormal   Collection Time: 05/22/24  8:36 AM  Result Value Ref Range Status   Toxigenic C. Difficile by PCR POSITIVE (A) NEGATIVE Final    Comment: Positive for toxigenic C. difficile with little to no toxin production. Only treat if clinical presentation suggests symptomatic illness.   Hypervirulent Strain PRESUMPTIVE NEGATIVE PRESUMPTIVE NEGATIVE Final    Comment: Performed at [facility], [address], [place], [place] [address]         Radiology Studies: No results found.       Scheduled Meds:  amLODipine   10 mg Oral Daily   donepezil   10 mg Oral Daily   enoxaparin  (LOVENOX ) injection  40 mg Subcutaneous Q24H   levothyroxine   75 mcg Oral Q0600   lidocaine   1 patch Transdermal Q24H   mirtazapine   15 mg Oral QHS   pantoprazole   40 mg Oral Daily   rOPINIRole   0.25 mg Oral QHS   rosuvastatin   10 mg Oral Daily   vancomycin   125 mg Oral QID   Continuous Infusions:     LOS: 3 days    Time spent: 35 minutes    Aianna Fahs A Persephonie Hegwood, MD [facility]   If 7PM-7AM, please contact night-coverage [email or web address]  05/24/2024, 4:18 PM

## 2024-05-24 NOTE — Progress Notes (Signed)
 Physical Therapy Treatment Patient Details Name: Elizabeth Mcintyre MRN: [id number] DOB: Jan 21, 1941 Today's Date: 05/24/2024   History of Present Illness 83 yo female admitted with UTI, severe sepsis, syncope, fall. Hx of dementia, CKD, lymphoma, RLS, GERD, hypothyroidism, neuropathy, gout.    PT Comments  PT - Cognition Comments: Hx Dementia but much improved cognition since admit.  Pt able to tell me she was at [facility] but unable to recall what happend to get her here.  Pt following all commands.  C/C MAX weakness.  feeling poorly. Assisted OOB required increased time and effort.  General bed mobility comments: increase in time and cues on sequencing.  Increased c/o HA and dizziness with position change.  Vitals taken.  Pt was NOT Orthostatic.  BP were slightly elevated at 160/75 and HR avg upper 90's. General transfer comment: assisted from bed to [facility] to void then to recliner.  Pt able to rise with Min Assist however required Mod Assist for safety completing 1/4 turn.  Once positioned in recliner, Pt request to have a BM.  So asissted from recliner back to [facility] (small soft stool) then back to recliner.  This completely exhausted Pt.  MAX c/o fatigue. General Gait Details: unable this session due to increased c/o dizzness and HA along with Max c/o weakness/fatigue. RN reported, she assisted Pt to bathroom earlier and was able to walk to and from with no c/o's.  Pt lives home with Son and was Indep.  LPT has rec Pt will need ST Rehab at SNF to address mobility and functional decline prior to safely returning home.    If plan is discharge home, recommend the following: A lot of help with walking and/or transfers;A lot of help with bathing/dressing/bathroom;Assistance with cooking/housework;Assist for transportation;Help with stairs or ramp for entrance   Can travel by private vehicle     No  Equipment Recommendations  Rolling walker (2 wheels)    Recommendations for Other Services        Precautions / Restrictions Precautions Precautions: Fall Restrictions Weight Bearing Restrictions Per Provider Order: No     Mobility  Bed Mobility Overal bed mobility: Needs Assistance Bed Mobility: Supine to Sit     Supine to sit: Supervision, Contact guard     General bed mobility comments: increase in time and cues on sequencing.  Increased c/o HA and dizziness with position change.  Vitals taken.  Pt was NOT Orthostatic.  BP were slightly elevated at 160/75 and HR avg upper 90's.    Transfers Overall transfer level: Needs assistance Equipment used: None, Rolling walker (2 wheels) Transfers: Bed to chair/wheelchair/BSC Sit to Stand: Min assist Stand pivot transfers: Min assist, Mod assist         General transfer comment: assisted from bed to [facility] to void then to recliner.  Pt able to rise with Min Assist however required Mod Assist for safety completing 1/4 turn.  Once positioned in recliner, Pt request to have a BM.  So asissted from recliner back to [facility] (small soft stool) then back to recliner.  This completely exhausted Pt.  MAX c/o fatigue.    Ambulation/Gait               General Gait Details: unable this session due to increased c/o dizzness and HA along with Max c/o weakness/fatigue.   Stairs             Wheelchair Mobility     Tilt Bed    Modified Rankin (Stroke Patients Only)  Balance                                            Communication    Cognition Arousal: Alert Behavior During Therapy: Flat affect   PT - Cognitive impairments: History of cognitive impairments                       PT - Cognition Comments: Hx Dementia but much improved cognition since admit.  Pt able to tell me she was at [facility] but unable to recall what happend to get her here.  Pt following all commands.  C/C MAX weakness.  feeling poorly. Following commands: Intact Following commands impaired: Follows one step  commands with increased time    Cueing Cueing Techniques: Verbal cues  Exercises      General Comments        Pertinent Vitals/Pain Pain Assessment Pain Assessment: Faces Pain Location: headache/chest pain, reported to RN Pain Descriptors / Indicators: Aching, Discomfort, Throbbing, Tightness Pain Intervention(s): Monitored during session    Home Living                          Prior Function            PT Goals (current goals can now be found in the care plan section) Progress towards PT goals: Progressing toward goals    Frequency    Min 3X/week      PT Plan      Co-evaluation              AM-PAC PT 6 Clicks Mobility   Outcome Measure  Help needed turning from your back to your side while in a flat bed without using bedrails?: A Lot Help needed moving from lying on your back to sitting on the side of a flat bed without using bedrails?: A Lot Help needed moving to and from a bed to a chair (including a wheelchair)?: A Lot Help needed standing up from a chair using your arms (e.g., wheelchair or bedside chair)?: A Lot Help needed to walk in hospital room?: A Lot Help needed climbing 3-5 steps with a railing? : Total 6 Click Score: 11    End of Session Equipment Utilized During Treatment: Gait belt Activity Tolerance: Patient limited by fatigue Patient left: in chair;with call bell/phone within reach;with chair alarm set Nurse Communication: Mobility status PT Visit Diagnosis: Muscle weakness (generalized) (M62.81);Difficulty in walking, not elsewhere classified (R26.2);Pain;History of falling (Z91.81)     Time: 8857-8783 PT Time Calculation (min) (ACUTE ONLY): 34 min  Charges:    $Therapeutic Activity: 23-37 mins PT General Charges $$ ACUTE PT VISIT: 1 Visit                     Katheryn Leap  PTA Acute  Rehabilitation Services Office M-F          [phone]

## 2024-05-24 NOTE — Plan of Care (Signed)
 Patient with 2 stools dayshift, type 6, not watery.  No c/o abd pain or nausea.  Tolerating regular diet.

## 2024-05-25 ENCOUNTER — Other Ambulatory Visit (HOSPITAL_COMMUNITY)

## 2024-05-25 DIAGNOSIS — N390 Urinary tract infection, site not specified: Secondary | ICD-10-CM

## 2024-05-25 DIAGNOSIS — R55 Syncope and collapse: Secondary | ICD-10-CM | POA: Diagnosis not present

## 2024-05-25 DIAGNOSIS — E785 Hyperlipidemia, unspecified: Secondary | ICD-10-CM | POA: Diagnosis not present

## 2024-05-25 DIAGNOSIS — I10 Essential (primary) hypertension: Secondary | ICD-10-CM

## 2024-05-25 DIAGNOSIS — J4520 Mild intermittent asthma, uncomplicated: Secondary | ICD-10-CM

## 2024-05-25 DIAGNOSIS — G2581 Restless legs syndrome: Secondary | ICD-10-CM

## 2024-05-25 DIAGNOSIS — E039 Hypothyroidism, unspecified: Secondary | ICD-10-CM

## 2024-05-25 DIAGNOSIS — M25512 Pain in left shoulder: Secondary | ICD-10-CM

## 2024-05-25 DIAGNOSIS — A419 Sepsis, unspecified organism: Secondary | ICD-10-CM | POA: Diagnosis not present

## 2024-05-25 DIAGNOSIS — K219 Gastro-esophageal reflux disease without esophagitis: Secondary | ICD-10-CM

## 2024-05-25 DIAGNOSIS — G309 Alzheimer's disease, unspecified: Secondary | ICD-10-CM

## 2024-05-25 DIAGNOSIS — F02A18 Dementia in other diseases classified elsewhere, mild, with other behavioral disturbance: Secondary | ICD-10-CM

## 2024-05-25 DIAGNOSIS — N1831 Chronic kidney disease, stage 3a: Secondary | ICD-10-CM

## 2024-05-25 LAB — CBC WITH DIFFERENTIAL/PLATELET
Abs Immature Granulocytes: 0.05 K/uL (ref 0.00–0.07)
Basophils Absolute: 0.1 K/uL (ref 0.0–0.1)
Basophils Relative: 1 %
Eosinophils Absolute: 0.6 K/uL — ABNORMAL HIGH (ref 0.0–0.5)
Eosinophils Relative: 7 %
HCT: 40.3 % (ref 36.0–46.0)
Hemoglobin: 13.5 g/dL (ref 12.0–15.0)
Immature Granulocytes: 1 %
Lymphocytes Relative: 35 %
Lymphs Abs: 3.1 K/uL (ref 0.7–4.0)
MCH: 29.3 pg (ref 26.0–34.0)
MCHC: 33.5 g/dL (ref 30.0–36.0)
MCV: 87.6 fL (ref 80.0–100.0)
Monocytes Absolute: 0.7 K/uL (ref 0.1–1.0)
Monocytes Relative: 8 %
Neutro Abs: 4.4 K/uL (ref 1.7–7.7)
Neutrophils Relative %: 48 %
Platelets: 229 K/uL (ref 150–400)
RBC: 4.6 MIL/uL (ref 3.87–5.11)
RDW: 13.2 % (ref 11.5–15.5)
WBC: 8.9 K/uL (ref 4.0–10.5)
nRBC: 0 % (ref 0.0–0.2)

## 2024-05-25 LAB — BASIC METABOLIC PANEL WITH GFR
Anion gap: 14 (ref 5–15)
BUN: 10 mg/dL (ref 8–23)
CO2: 22 mmol/L (ref 22–32)
Calcium: 9.3 mg/dL (ref 8.9–10.3)
Chloride: 104 mmol/L (ref 98–111)
Creatinine, Ser: 1.3 mg/dL — ABNORMAL HIGH (ref 0.44–1.00)
GFR, Estimated: 41 mL/min — ABNORMAL LOW (ref >60)
Glucose, Bld: 126 mg/dL — ABNORMAL HIGH (ref 70–99)
Potassium: 3.4 mmol/L — ABNORMAL LOW (ref 3.5–5.1)
Sodium: 140 mmol/L (ref 135–145)

## 2024-05-25 MED ORDER — SODIUM CHLORIDE 0.9 % IV BOLUS
500.0000 mL | Freq: Once | INTRAVENOUS | Status: AC
  Administered 2024-05-25: 500 mL via INTRAVENOUS

## 2024-05-25 MED ORDER — FAMOTIDINE 20 MG PO TABS
40.0000 mg | ORAL_TABLET | Freq: Two times a day (BID) | ORAL | Status: DC
  Administered 2024-05-25 – 2024-05-28 (×6): 40 mg via ORAL
  Filled 2024-05-25 (×7): qty 2

## 2024-05-25 MED ORDER — ALUM & MAG HYDROXIDE-SIMETH 200-200-20 MG/5ML PO SUSP
30.0000 mL | Freq: Once | ORAL | Status: AC
  Administered 2024-05-25: 30 mL via ORAL
  Filled 2024-05-25: qty 30

## 2024-05-25 MED ORDER — FLUTICASONE PROPIONATE 50 MCG/ACT NA SUSP
2.0000 | Freq: Every day | NASAL | Status: DC
  Administered 2024-05-25 – 2024-05-27 (×3): 2 via NASAL
  Filled 2024-05-25: qty 16

## 2024-05-25 MED ORDER — POTASSIUM CHLORIDE CRYS ER 20 MEQ PO TBCR
40.0000 meq | EXTENDED_RELEASE_TABLET | Freq: Once | ORAL | Status: AC
  Administered 2024-05-25: 40 meq via ORAL
  Filled 2024-05-25: qty 2

## 2024-05-25 MED ORDER — LIDOCAINE VISCOUS HCL 2 % MT SOLN
15.0000 mL | Freq: Once | OROMUCOSAL | Status: AC
  Administered 2024-05-25: 15 mL via ORAL
  Filled 2024-05-25: qty 15

## 2024-05-25 NOTE — Plan of Care (Signed)
   Problem: Education: Goal: Knowledge of General Education information will improve Description: Including pain rating scale, medication(s)/side effects and non-pharmacologic comfort measures Outcome: Progressing   Problem: Clinical Measurements: Goal: Ability to maintain clinical measurements within normal limits will improve Outcome: Progressing Goal: Diagnostic test results will improve Outcome: Progressing

## 2024-05-25 NOTE — TOC Progression Note (Signed)
 Transition of Care [facility]) - Progression Note    Patient Details  Name: Elizabeth Mcintyre MRN: [id number] Date of Birth: 1941-08-15  [facility] CM/SW Contact  [name], LCSW Phone Number: 05/25/2024, 11:51 AM  Clinical Narrative:     Pt's insurance authorization is pending. IP care management to follow.   Expected Discharge Plan: Skilled Nursing Facility Barriers to Discharge: Continued Medical Work up               Expected Discharge Plan and Services                                               Social Drivers of Health (SDOH) Interventions SDOH Screenings   Food Insecurity: No Food Insecurity (05/21/2024)  Housing: Low Risk  (05/21/2024)  Transportation Needs: No Transportation Needs (05/21/2024)  Utilities: Not At Risk (05/21/2024)  Alcohol Screen: Low Risk  (06/04/2021)  Depression (PHQ2-9): Low Risk  (08/03/2023)  Financial Resource Strain: Low Risk  (06/04/2021)  Physical Activity: Insufficiently Active (05/27/2023)  Social Connections: Moderately Integrated (05/21/2024)  Stress: Stress Concern Present (05/27/2023)  Tobacco Use: Low Risk  (05/23/2024)    Readmission Risk Interventions    05/19/2023    2:38 PM  Readmission Risk Prevention Plan  Post Dischage Appt Complete  Medication Screening Complete  Transportation Screening Complete

## 2024-05-25 NOTE — Progress Notes (Signed)
 PROGRESS NOTE    Elizabeth Mcintyre  FMW:[id number] DOB: [birth year] DOA: 05/21/2024 PCP: [name], MD   Chief Complaint  Patient presents with   Loss of Consciousness    Brief Narrative:  83 year old with past medical history significant for asthma, hypertension, hypothyroidism, dementia, B12 deficiency, RLS, peripheral neuropathy, GERD, gout, hyperlipidemia, carotid artery stenosis presents complaining of syncope and history of vomiting and diarrhea for 2 weeks.  Patient also has been having chest discomfort for the last 2 or 3 weeks.  She has a history of peptic ulcer disease and used to take Carafate .  She is compliant taking her PPI at home.  UA was concerning for infection and she was admitted for further workup.   Patient was found to have C. difficile colitis.  In regard to her chest pain, could be  GI in origin related to her reflux.  She was started on Carafate   and PPI. Diarrhea improving.    Assessment & Plan:   Principal Problem:   Severe sepsis from UTI Active Problems:   Syncope   Acute pain of left shoulder   Diarrhea   Essential hypertension   Mild intermittent asthma   Mild dementia, unclear etiology   Hypothyroidism   Gastroesophageal reflux disease   Dyslipidemia   Stage 3a chronic kidney disease   Restless legs syndrome   UTI (urinary tract infection)  #1 severe sepsis secondary to UTI and C. difficile colitis and norovirus - Patient noted to have met criteria on admission for severe sepsis with tachycardia, lactic acidosis of 4.8, hypothermia, source of infection noted to be UTI as well as C. difficile colitis. - Urine cultures grew 70,000 colonies of E. coli and 30,000 colonies of Streptococcus agalactiae. - Status post 3 days of IV Rocephin . - Continue oral vancomycin  for C. difficile colitis. - Supportive care.  2.  Syncope -Felt likely related to hypovolemia in the setting of dehydration secondary to diarrhea and poor oral intake. - 2D echo  done with normal EF of 70 to 75%,NWMA, grade 1 DD. - CT head done with generalized cerebral atrophy and microvascular disease changes of the supratentorial brain.  No acute intracranial abnormalities. - Patient received IV fluids. - 500 cc normal saline bolus. - Continue TED hose.  3.  C. difficile colitis/norovirus gastroenteritis -Patient noted to have a positive C. difficile antigen and PCR. - Consistency and frequency of stools improving. - Continue oral vancomycin . - Supportive care.  4.  Hypokalemia -Secondary to GI losses. - Replete.  5.  History of PUD/GERD -Patient noted to report belching and some chest tightness/chest pain. - Chest tightness felt likely related to dyspepsia. - Cardiac enzymes negative, 2D echo with no wall motion abnormalities. - Patient status post cholecystectomy 2018. - CT abdomen and pelvis done with no biliary duct dilatation. - Continue Carafate . - Due to C. difficile colitis will discontinue PPI and placed on Pepcid  twice daily. - GI cocktail x 1.  6.  Left shoulder pain -Lidocaine  patch.  7.  Hypertension - Continue Norvasc  10 mg daily.  8.  Mild intermittent asthma -Nebs as needed.  9.  Mild dementia -Continue home regimen Aricept  and mirtazapine .  10.  Hypothyroidism -Synthroid .  11.  Hyperlipidemia -Crestor .  12.  CKD stage IIIa -Baseline creatinine approximately 1.2-1.5. - Stable.  13.  RLS -Continue Requip .  Normocytic   DVT prophylaxis: Lovenox  Code Status: Full Family Communication: Updated patient and daughter at bedside Disposition: To SNF when bed available.  Status is: Inpatient Remains  inpatient appropriate because: Severity of illness   Consultants:  None  Procedures:  CT head CT C-spine 05/21/2024 CT chest abdomen and pelvis 05/21/2024 Plain films of the left shoulder 05/21/2024 Plain films of the C-spine 05/21/2024 Chest x-ray 05/21/2024 2D echo 05/22/2024   Antimicrobials:  Anti-infectives (From  admission, onward)    Start     Dose/Rate Route Frequency Ordered Stop   05/22/24 1715  vancomycin  (VANCOCIN ) capsule 125 mg        125 mg Oral 4 times daily 05/22/24 1623 06/01/24 1759   05/21/24 2200  cefTRIAXone  (ROCEPHIN ) 1 g in sodium chloride  0.9 % 100 mL IVPB  Status:  Discontinued        1 g 200 mL/hr over 30 Minutes Intravenous Every 24 hours 05/21/24 1627 05/24/24 0814   05/21/24 1315  ceFEPIme  (MAXIPIME ) 2 g in sodium chloride  0.9 % 100 mL IVPB        2 g 200 mL/hr over 30 Minutes Intravenous  Once 05/21/24 1309 05/21/24 1352   05/21/24 1315  metroNIDAZOLE  (FLAGYL ) IVPB 500 mg        500 mg 100 mL/hr over 60 Minutes Intravenous  Once 05/21/24 1309 05/21/24 1527   05/21/24 1315  vancomycin  (VANCOCIN ) IVPB 1000 mg/200 mL premix        1,000 mg 200 mL/hr over 60 Minutes Intravenous  Once 05/21/24 1309 05/21/24 1527         Subjective: Patient laying in bed.  Sleeping but easily arousable.  Some complaints of headache.  Some complaints of some chest tightness.  Tolerating current diet.  Improvement in loose stools.  Patient noted to have soft stools x 2 yesterday per RN.  Daughter at bedside.  Objective: Vitals:   05/25/24 0504 05/25/24 0952 05/25/24 0955 05/25/24 0957  BP: (!) 155/77 (!) 143/63 136/67 126/71  Pulse: 66 74 96 (!) 103  Resp: 16 16    Temp: 97.7 F (36.5 C) 97.8 F (36.6 C)    TempSrc: Oral Oral    SpO2: 97% 98%    Weight:      Height:        Intake/Output Summary (Last 24 hours) at 05/25/2024 1310 Last data filed at 05/25/2024 0539 Gross per 24 hour  Intake --  Output 450 ml  Net -450 ml   Filed Weights   05/21/24 2300  Weight: 62.4 kg    Examination:  General exam: Appears calm and comfortable  Respiratory system: Clear to auscultation. Respiratory effort normal. Cardiovascular system: S1 & S2 heard, RRR. No JVD, murmurs, rubs, gallops or clicks. No pedal edema. Gastrointestinal system: Abdomen is nondistended, soft and nontender. No  organomegaly or masses felt. Normal bowel sounds heard. Central nervous system: Alert and oriented. No focal neurological deficits. Extremities: Symmetric 5 x 5 power. Skin: No rashes, lesions or ulcers Psychiatry: Judgement and insight appear normal. Mood & affect appropriate.     Data Reviewed: I have personally reviewed following labs and imaging studies  CBC: Recent Labs  Lab 05/21/24 1249 05/22/24 0306 05/23/24 0407 05/24/24 0412 05/25/24 0413  WBC 10.9* 13.6* 7.7 9.5 8.9  NEUTROABS 7.6  --  3.3 4.3 4.4  HGB 14.8 13.4 14.0 13.5 13.5  HCT 42.2 38.8 40.3 39.9 40.3  MCV 85.9 86.8 85.2 89.3 87.6  PLT 264 231 149* 234 229    Basic Metabolic Panel: Recent Labs  Lab 05/21/24 1249 05/21/24 1539 05/22/24 0306 05/23/24 0654 05/24/24 0412 05/25/24 0413  NA 138  --  140 141 143  140  K 3.7  --  3.8 2.7* 3.3* 3.4*  CL 99  --  100 103 106 104  CO2 20*  --  24 21* 21* 22  GLUCOSE 219*  --  127* 180* 105* 126*  BUN 22  --  14 11 9 10   CREATININE 1.35*  --  1.09* 1.25* 1.15* 1.30*  CALCIUM  10.4*  --  9.5 8.9 9.2 9.3  MG  --  1.9  --  1.9 2.0  --     GFR: Estimated Creatinine Clearance: 28.8 mL/min (A) (by C-G formula based on SCr of 1.3 mg/dL (H)).  Liver Function Tests: Recent Labs  Lab 05/21/24 1249 05/22/24 0306  AST 43* 40  ALT 56* 46*  ALKPHOS 73 65  BILITOT 0.7 0.6  PROT 7.2 6.4*  ALBUMIN 4.6 4.2    CBG: No results for input(s): GLUCAP in the last 168 hours.   Recent Results (from the past 240 hours)  Urine Culture     Status: Abnormal   Collection Time: 05/21/24 12:41 PM   Specimen: Urine, Random  Result Value Ref Range Status   Specimen Description   Final    URINE, RANDOM Performed at [facility], [address] [place], [place] [address]    Special Requests   Final    NONE Reflexed from [id number] Performed at [facility], [address], [place], [place] [address]    Culture (A)  Final    70,000  COLONIES/mL ESCHERICHIA COLI 30,000 COLONIES/mL STREPTOCOCCUS AGALACTIAE TESTING AGAINST S. AGALACTIAE NOT ROUTINELY PERFORMED DUE TO PREDICTABILITY OF AMP/PEN/VAN SUSCEPTIBILITY. Performed at [facility], [address], [place], [place] [address]    Report Status 05/23/2024 FINAL  Final   Organism ID, Bacteria ESCHERICHIA COLI (A)  Final      Susceptibility   Escherichia coli - MIC*    AMPICILLIN <=2 SENSITIVE Sensitive     CEFAZOLIN  (URINE) Value in next row Sensitive      <=1 SENSITIVEThis is a modified FDA-approved test that has been validated and its performance characteristics determined by the reporting laboratory.  This laboratory is certified under the Clinical Laboratory Improvement Amendments CLIA as qualified to perform high complexity clinical laboratory testing.    CEFEPIME  Value in next row Sensitive      <=1 SENSITIVEThis is a modified FDA-approved test that has been validated and its performance characteristics determined by the reporting laboratory.  This laboratory is certified under the Clinical Laboratory Improvement Amendments CLIA as qualified to perform high complexity clinical laboratory testing.    ERTAPENEM Value in next row Sensitive      <=1 SENSITIVEThis is a modified FDA-approved test that has been validated and its performance characteristics determined by the reporting laboratory.  This laboratory is certified under the Clinical Laboratory Improvement Amendments CLIA as qualified to perform high complexity clinical laboratory testing.    CEFTRIAXONE  Value in next row Sensitive      <=1 SENSITIVEThis is a modified FDA-approved test that has been validated and its performance characteristics determined by the reporting laboratory.  This laboratory is certified under the Clinical Laboratory Improvement Amendments CLIA as qualified to perform high complexity clinical laboratory testing.    CIPROFLOXACIN  Value in next row Sensitive      <=1 SENSITIVEThis is a  modified FDA-approved test that has been validated and its performance characteristics determined by the reporting laboratory.  This laboratory is certified under the Clinical Laboratory Improvement Amendments CLIA as qualified to perform  high complexity clinical laboratory testing.    GENTAMICIN Value in next row Sensitive      <=1 SENSITIVEThis is a modified FDA-approved test that has been validated and its performance characteristics determined by the reporting laboratory.  This laboratory is certified under the Clinical Laboratory Improvement Amendments CLIA as qualified to perform high complexity clinical laboratory testing.    NITROFURANTOIN Value in next row Sensitive      <=1 SENSITIVEThis is a modified FDA-approved test that has been validated and its performance characteristics determined by the reporting laboratory.  This laboratory is certified under the Clinical Laboratory Improvement Amendments CLIA as qualified to perform high complexity clinical laboratory testing.    TRIMETH /SULFA  Value in next row Sensitive      <=1 SENSITIVEThis is a modified FDA-approved test that has been validated and its performance characteristics determined by the reporting laboratory.  This laboratory is certified under the Clinical Laboratory Improvement Amendments CLIA as qualified to perform high complexity clinical laboratory testing.    AMPICILLIN/SULBACTAM Value in next row Sensitive      <=1 SENSITIVEThis is a modified FDA-approved test that has been validated and its performance characteristics determined by the reporting laboratory.  This laboratory is certified under the Clinical Laboratory Improvement Amendments CLIA as qualified to perform high complexity clinical laboratory testing.    PIP/TAZO Value in next row Sensitive ug/mL     <=4 SENSITIVEThis is a modified FDA-approved test that has been validated and its performance characteristics determined by the reporting laboratory.  This laboratory is  certified under the Clinical Laboratory Improvement Amendments CLIA as qualified to perform high complexity clinical laboratory testing.    MEROPENEM Value in next row Sensitive      <=4 SENSITIVEThis is a modified FDA-approved test that has been validated and its performance characteristics determined by the reporting laboratory.  This laboratory is certified under the Clinical Laboratory Improvement Amendments CLIA as qualified to perform high complexity clinical laboratory testing.    * 70,000 COLONIES/mL ESCHERICHIA COLI  Blood Culture (routine x 2)     Status: None (Preliminary result)   Collection Time: 05/21/24 12:49 PM   Specimen: BLOOD  Result Value Ref Range Status   Specimen Description   Final    BLOOD RIGHT ANTECUBITAL Performed at [facility], [address] [place], [place] [address]    Special Requests   Final    BOTTLES DRAWN AEROBIC AND ANAEROBIC Blood Culture adequate volume Performed at [facility], [address] [place], [place] [address]    Culture   Final    NO GROWTH 4 DAYS Performed at [facility], [address], [place], [place] [address]    Report Status PENDING  Incomplete  Blood Culture (routine x 2)     Status: None (Preliminary result)   Collection Time: 05/21/24  1:25 PM   Specimen: BLOOD  Result Value Ref Range Status   Specimen Description   Final    BLOOD LEFT ANTECUBITAL Performed at [facility], [address], [place], [place] [address]    Special Requests   Final    BOTTLES DRAWN AEROBIC ONLY Blood Culture results may not be optimal due to an inadequate volume of blood received in culture bottles Performed at [facility], [address] [place], [place] [address]    Culture   Final    NO GROWTH 4 DAYS Performed at [facility], [address], [place], [place] [address]  Report Status PENDING  Incomplete  Resp panel by RT-PCR (RSV, Flu A&B,  Covid) Anterior Nasal Swab     Status: None   Collection Time: 05/21/24  1:34 PM   Specimen: Anterior Nasal Swab  Result Value Ref Range Status   SARS Coronavirus 2 by RT PCR NEGATIVE NEGATIVE Final    Comment: (NOTE) SARS-CoV-2 target nucleic acids are NOT DETECTED.  The SARS-CoV-2 RNA is generally detectable in upper respiratory specimens during the acute phase of infection. The lowest concentration of SARS-CoV-2 viral copies this assay can detect is 138 copies/mL. A negative result does not preclude SARS-Cov-2 infection and should not be used as the sole basis for treatment or other patient management decisions. A negative result may occur with  improper specimen collection/handling, submission of specimen other than nasopharyngeal swab, presence of viral mutation(s) within the areas targeted by this assay, and inadequate number of viral copies(<138 copies/mL). A negative result must be combined with clinical observations, patient history, and epidemiological information. The expected result is Negative.  Fact Sheet for Patients:  BloggerCourse.com  Fact Sheet for Healthcare Providers:  SeriousBroker.it  This test is no t yet approved or cleared by the [place]  FDA and  has been authorized for detection and/or diagnosis of SARS-CoV-2 by FDA under an Emergency Use Authorization (EUA). This EUA will remain  in effect (meaning this test can be used) for the duration of the COVID-19 declaration under Section 564(b)(1) of the Act, 21 U.S.C.section 360bbb-3(b)(1), unless the authorization is terminated  or revoked sooner.       Influenza A by PCR NEGATIVE NEGATIVE Final   Influenza B by PCR NEGATIVE NEGATIVE Final    Comment: (NOTE) The Xpert Xpress SARS-CoV-2/FLU/RSV plus assay is intended as an aid in the diagnosis of influenza from Nasopharyngeal swab specimens and should not be used as a sole basis for treatment. Nasal  washings and aspirates are unacceptable for Xpert Xpress SARS-CoV-2/FLU/RSV testing.  Fact Sheet for Patients: BloggerCourse.com  Fact Sheet for Healthcare Providers: SeriousBroker.it  This test is not yet approved or cleared by the [place]  FDA and has been authorized for detection and/or diagnosis of SARS-CoV-2 by FDA under an Emergency Use Authorization (EUA). This EUA will remain in effect (meaning this test can be used) for the duration of the COVID-19 declaration under Section 564(b)(1) of the Act, 21 U.S.C. section 360bbb-3(b)(1), unless the authorization is terminated or revoked.     Resp Syncytial Virus by PCR NEGATIVE NEGATIVE Final    Comment: (NOTE) Fact Sheet for Patients: BloggerCourse.com  Fact Sheet for Healthcare Providers: SeriousBroker.it  This test is not yet approved or cleared by the [place]  FDA and has been authorized for detection and/or diagnosis of SARS-CoV-2 by FDA under an Emergency Use Authorization (EUA). This EUA will remain in effect (meaning this test can be used) for the duration of the COVID-19 declaration under Section 564(b)(1) of the Act, 21 U.S.C. section 360bbb-3(b)(1), unless the authorization is terminated or revoked.  Performed at [facility], [address], [place], [place] [address]   Gastrointestinal Panel by PCR , Stool     Status: Abnormal   Collection Time: 05/22/24  8:36 AM   Specimen: Stool  Result Value Ref Range Status   Campylobacter species NOT DETECTED NOT DETECTED Final   Plesimonas shigelloides NOT DETECTED NOT DETECTED Final   Salmonella species NOT DETECTED NOT DETECTED Final   Yersinia enterocolitica NOT DETECTED NOT DETECTED Final   Vibrio species NOT DETECTED NOT  DETECTED Final   Vibrio cholerae NOT DETECTED NOT DETECTED Final   Enteroaggregative E coli (EAEC) NOT DETECTED NOT  DETECTED Final   Enteropathogenic E coli (EPEC) NOT DETECTED NOT DETECTED Final   Enterotoxigenic E coli (ETEC) NOT DETECTED NOT DETECTED Final   Shiga like toxin producing E coli (STEC) NOT DETECTED NOT DETECTED Final   Shigella/Enteroinvasive E coli (EIEC) NOT DETECTED NOT DETECTED Final   Cryptosporidium NOT DETECTED NOT DETECTED Final   Cyclospora cayetanensis NOT DETECTED NOT DETECTED Final   Entamoeba histolytica NOT DETECTED NOT DETECTED Final   Giardia lamblia NOT DETECTED NOT DETECTED Final   Adenovirus F40/41 NOT DETECTED NOT DETECTED Final   Astrovirus NOT DETECTED NOT DETECTED Final   Norovirus GI/GII DETECTED (A) NOT DETECTED Final    Comment: CRITICAL RESULT CALLED TO, READ BACK BY AND VERIFIED WITH: HEATHER BULLINS 05/24/24 1205 TDL    Rotavirus A NOT DETECTED NOT DETECTED Final   Sapovirus (I, II, IV, and V) NOT DETECTED NOT DETECTED Final    Comment: Performed at [facility], [address] [place], [place] [address]  C Difficile Quick Screen w PCR reflex     Status: Abnormal   Collection Time: 05/22/24  8:36 AM   Specimen: Stool  Result Value Ref Range Status   C Diff antigen POSITIVE (A) NEGATIVE Final   C Diff toxin NEGATIVE NEGATIVE Final   C Diff interpretation Results are indeterminate. See PCR results.  Final    Comment: Performed at [facility], [address], [place], [place] [address]  C. Diff by PCR, Reflexed     Status: Abnormal   Collection Time: 05/22/24  8:36 AM  Result Value Ref Range Status   Toxigenic C. Difficile by PCR POSITIVE (A) NEGATIVE Final    Comment: Positive for toxigenic C. difficile with little to no toxin production. Only treat if clinical presentation suggests symptomatic illness.   Hypervirulent Strain PRESUMPTIVE NEGATIVE PRESUMPTIVE NEGATIVE Final    Comment: Performed at [facility], [address], [place], [place] [address]         Radiology Studies: No results  found.      Scheduled Meds:  amLODipine   10 mg Oral Daily   donepezil   10 mg Oral Daily   enoxaparin  (LOVENOX ) injection  40 mg Subcutaneous Q24H   levothyroxine   75 mcg Oral Q0600   lidocaine   1 patch Transdermal Q24H   mirtazapine   15 mg Oral QHS   pantoprazole   40 mg Oral Daily   rOPINIRole   0.25 mg Oral QHS   rosuvastatin   10 mg Oral Daily   vancomycin   125 mg Oral QID   Continuous Infusions:   LOS: 4 days    Time spent: 40 minutes    Toribio Hummer, MD [facility]   To contact the attending provider between 7A-7P or the covering provider during after hours 7P-7A, please log into the web site [email or web address] and access using universal [facility] password for that web site. If you do not have the password, please call the hospital operator.  05/25/2024, 1:10 PM

## 2024-05-25 NOTE — Progress Notes (Signed)
 Occupational Therapy Treatment Patient Details Name: Elizabeth Mcintyre MRN: [id number] DOB: [birth year] Today's Date: 05/25/2024   History of present illness 83 yo female admitted with UTI, severe sepsis, syncope, fall. Hx of dementia, CKD, lymphoma, RLS, GERD, hypothyroidism, neuropathy, gout.   OT comments  Pt resting in bed comfortably, daughter present during session. Daughter and RN state Pt had difficulty getting to [facility] earlier today, a bit lethargic. During session Pt was alert and eager for OOB activities. Pt able to complete ADLs and ambulation around room unassisted with supervision. Pt stood and ambulated around room for 15 minutes, no overt balance deficits. Pt able to complete dressing sitting EOB at supervision level, did c/o of abdomen pain when bending over to don/doff socks. Pt would benefit from continued acute OT to maximize consistency and safety with OOB activities. Pt DC plan still appropriate as Pt at times needs increased assistance, would benefit from further therapy to maximize strength and activity tolerance.       If plan is discharge home, recommend the following:  A little help with walking and/or transfers;A little help with bathing/dressing/bathroom;Assistance with cooking/housework;Direct supervision/assist for medications management;Direct supervision/assist for financial management;Assist for transportation;Help with stairs or ramp for entrance;Supervision due to cognitive status   Equipment Recommendations  BSC/3in1;Other (comment) (RW)    Recommendations for Other Services      Precautions / Restrictions Precautions Precautions: Fall Recall of Precautions/Restrictions: Impaired Restrictions Weight Bearing Restrictions Per Provider Order: No       Mobility Bed Mobility Overal bed mobility: Modified Independent                  Transfers Overall transfer level: Needs assistance Equipment used: None Transfers: Sit to/from Stand, Bed to  chair/wheelchair/BSC Sit to Stand: Supervision           General transfer comment: supervision for safety     Balance Overall balance assessment: Mild deficits observed, not formally tested                                         ADL either performed or assessed with clinical judgement   ADL Overall ADL's : Needs assistance/impaired                                       General ADL Comments: Pt supervision for safety for ADLs and ambulation around room. Pt ambulated 20 laps around room without AD, no physical assistance, no significant balance deficits, able to static stand unsupported as needed.    Extremity/Trunk Assessment Upper Extremity Assessment Upper Extremity Assessment: Overall WFL for tasks assessed            Vision       Perception     Praxis     Communication Communication Communication: No apparent difficulties   Cognition Arousal: Alert Behavior During Therapy: WFL for tasks assessed/performed Cognition: History of cognitive impairments                               Following commands: Intact        Cueing   Cueing Techniques: Verbal cues  Exercises      Shoulder Instructions       General Comments      Pertinent Vitals/  Pain       Pain Assessment Pain Assessment: Faces Faces Pain Scale: Hurts little more Pain Location: headache/chest pain, reported to RN Pain Descriptors / Indicators: Aching, Discomfort, Throbbing, Tightness Pain Intervention(s): Monitored during session  Home Living                                          Prior Functioning/Environment              Frequency  Min 2X/week        Progress Toward Goals  OT Goals(current goals can now be found in the care plan section)  Progress towards OT goals: Progressing toward goals  Acute Rehab OT Goals Patient Stated Goal: to return home OT Goal Formulation: With patient Time For Goal  Achievement: 06/06/24 Potential to Achieve Goals: Good ADL Goals Pt Will Perform Upper Body Bathing: with modified independence;sitting Pt Will Perform Lower Body Bathing: with modified independence;sit to/from stand Pt Will Perform Lower Body Dressing: with modified independence;sit to/from stand Pt Will Transfer to Toilet: with modified independence;ambulating Pt Will Perform Toileting - Clothing Manipulation and hygiene: with modified independence;sit to/from stand  Plan      Co-evaluation                 AM-PAC OT 6 Clicks Daily Activity     Outcome Measure   Help from another person eating meals?: None Help from another person taking care of personal grooming?: A Little Help from another person toileting, which includes using toliet, bedpan, or urinal?: A Little Help from another person bathing (including washing, rinsing, drying)?: A Little Help from another person to put on and taking off regular upper body clothing?: None Help from another person to put on and taking off regular lower body clothing?: A Little 6 Click Score: 20    End of Session Equipment Utilized During Treatment: Gait belt  OT Visit Diagnosis: Unsteadiness on feet (R26.81);Other abnormalities of gait and mobility (R26.89);Muscle weakness (generalized) (M62.81);History of falling (Z91.81);Pain   Activity Tolerance Patient tolerated treatment well   Patient Left in bed;with call bell/phone within reach;with family/visitor present   Nurse Communication Mobility status        Time: 8441-8374 OT Time Calculation (min): 27 min  Charges: OT General Charges $OT Visit: 1 Visit OT Treatments $Self Care/Home Management : 8-22 mins $Therapeutic Activity: 8-22 mins  [name], OTR/L   [name] 05/25/2024, 4:40 PM

## 2024-05-26 DIAGNOSIS — R55 Syncope and collapse: Secondary | ICD-10-CM | POA: Diagnosis not present

## 2024-05-26 DIAGNOSIS — A419 Sepsis, unspecified organism: Secondary | ICD-10-CM | POA: Diagnosis not present

## 2024-05-26 DIAGNOSIS — E785 Hyperlipidemia, unspecified: Secondary | ICD-10-CM | POA: Diagnosis not present

## 2024-05-26 DIAGNOSIS — N390 Urinary tract infection, site not specified: Secondary | ICD-10-CM | POA: Diagnosis not present

## 2024-05-26 LAB — MAGNESIUM: Magnesium: 1.9 mg/dL (ref 1.7–2.4)

## 2024-05-26 LAB — CBC WITH DIFFERENTIAL/PLATELET
Abs Immature Granulocytes: 0.04 K/uL (ref 0.00–0.07)
Basophils Absolute: 0.1 K/uL (ref 0.0–0.1)
Basophils Relative: 1 %
Eosinophils Absolute: 0.6 K/uL — ABNORMAL HIGH (ref 0.0–0.5)
Eosinophils Relative: 6 %
HCT: 39.9 % (ref 36.0–46.0)
Hemoglobin: 13.3 g/dL (ref 12.0–15.0)
Immature Granulocytes: 0 %
Lymphocytes Relative: 40 %
Lymphs Abs: 3.9 K/uL (ref 0.7–4.0)
MCH: 29.1 pg (ref 26.0–34.0)
MCHC: 33.3 g/dL (ref 30.0–36.0)
MCV: 87.3 fL (ref 80.0–100.0)
Monocytes Absolute: 0.7 K/uL (ref 0.1–1.0)
Monocytes Relative: 7 %
Neutro Abs: 4.4 K/uL (ref 1.7–7.7)
Neutrophils Relative %: 46 %
Platelets: 229 K/uL (ref 150–400)
RBC: 4.57 MIL/uL (ref 3.87–5.11)
RDW: 13.1 % (ref 11.5–15.5)
WBC: 9.8 K/uL (ref 4.0–10.5)
nRBC: 0 % (ref 0.0–0.2)

## 2024-05-26 LAB — CULTURE, BLOOD (ROUTINE X 2)
Culture: NO GROWTH
Culture: NO GROWTH
Special Requests: ADEQUATE

## 2024-05-26 LAB — BASIC METABOLIC PANEL WITH GFR
Anion gap: 14 (ref 5–15)
BUN: 9 mg/dL (ref 8–23)
CO2: 21 mmol/L — ABNORMAL LOW (ref 22–32)
Calcium: 9.2 mg/dL (ref 8.9–10.3)
Chloride: 106 mmol/L (ref 98–111)
Creatinine, Ser: 1.21 mg/dL — ABNORMAL HIGH (ref 0.44–1.00)
GFR, Estimated: 45 mL/min — ABNORMAL LOW (ref >60)
Glucose, Bld: 142 mg/dL — ABNORMAL HIGH (ref 70–99)
Potassium: 3.5 mmol/L (ref 3.5–5.1)
Sodium: 141 mmol/L (ref 135–145)

## 2024-05-26 LAB — GLUCOSE, CAPILLARY: Glucose-Capillary: 129 mg/dL — ABNORMAL HIGH (ref 70–99)

## 2024-05-26 MED ORDER — LIDOCAINE VISCOUS HCL 2 % MT SOLN
15.0000 mL | OROMUCOSAL | Status: DC | PRN
  Administered 2024-05-27: 15 mL via ORAL
  Filled 2024-05-26: qty 15

## 2024-05-26 MED ORDER — ALUM & MAG HYDROXIDE-SIMETH 200-200-20 MG/5ML PO SUSP
30.0000 mL | ORAL | Status: DC | PRN
  Administered 2024-05-27 (×2): 30 mL via ORAL
  Filled 2024-05-26 (×2): qty 30

## 2024-05-26 MED ORDER — POTASSIUM CHLORIDE CRYS ER 20 MEQ PO TBCR
40.0000 meq | EXTENDED_RELEASE_TABLET | Freq: Once | ORAL | Status: AC
  Administered 2024-05-26: 40 meq via ORAL
  Filled 2024-05-26: qty 2

## 2024-05-26 NOTE — TOC Progression Note (Addendum)
 Transition of Care [facility]) - Progression Note    Patient Details  Name: Elizabeth Mcintyre MRN: [id number] Date of Birth: 1941/08/23  [facility] CM/SW Contact  [name], LCSW Phone Number: 05/26/2024, 11:06 AM  Clinical Narrative:    Authorization and PASSR pending. IPCM following    Adden 1:30pm Pt's insurance authorization was approved for sNF placement auth ID [id number] and EMS [id number]. Pt's PASRR is pending . IP care management to follow.   Expected Discharge Plan: Skilled Nursing Facility Barriers to Discharge: Continued Medical Work up               Expected Discharge Plan and Services                                               Social Drivers of Health (SDOH) Interventions SDOH Screenings   Food Insecurity: No Food Insecurity (05/21/2024)  Housing: Low Risk  (05/21/2024)  Transportation Needs: No Transportation Needs (05/21/2024)  Utilities: Not At Risk (05/21/2024)  Alcohol Screen: Low Risk  (06/04/2021)  Depression (PHQ2-9): Low Risk  (08/03/2023)  Financial Resource Strain: Low Risk  (06/04/2021)  Physical Activity: Insufficiently Active (05/27/2023)  Social Connections: Moderately Integrated (05/21/2024)  Stress: Stress Concern Present (05/27/2023)  Tobacco Use: Low Risk  (05/23/2024)    Readmission Risk Interventions    05/19/2023    2:38 PM  Readmission Risk Prevention Plan  Post Dischage Appt Complete  Medication Screening Complete  Transportation Screening Complete

## 2024-05-26 NOTE — Progress Notes (Signed)
 PROGRESS NOTE    Elizabeth Mcintyre  FMW:[id number] DOB: February 21, 1941 DOA: 05/21/2024 PCP: [name], MD   Chief Complaint  Patient presents with   Loss of Consciousness    Brief Narrative:  83 year old with past medical history significant for asthma, hypertension, hypothyroidism, dementia, B12 deficiency, RLS, peripheral neuropathy, GERD, gout, hyperlipidemia, carotid artery stenosis presents complaining of syncope and history of vomiting and diarrhea for 2 weeks.  Patient also has been having chest discomfort for the last 2 or 3 weeks.  She has a history of peptic ulcer disease and used to take Carafate .  She is compliant taking her PPI at home.  UA was concerning for infection and she was admitted for further workup.   Patient was found to have C. difficile colitis.  In regard to her chest pain, could be  GI in origin related to her reflux.  She was started on Carafate   and PPI. Diarrhea improving.    Assessment & Plan:   Principal Problem:   Severe sepsis from UTI Active Problems:   Syncope   Acute pain of left shoulder   Diarrhea   Essential hypertension   Mild intermittent asthma   Mild dementia, unclear etiology   Hypothyroidism   Gastroesophageal reflux disease   Dyslipidemia   Stage 3a chronic kidney disease   Restless legs syndrome   UTI (urinary tract infection)  #1 severe sepsis secondary to UTI and C. difficile colitis and norovirus - Patient noted to have met criteria on admission for severe sepsis with tachycardia, lactic acidosis of 4.8, hypothermia, source of infection noted to be UTI as well as C. difficile colitis. - Urine cultures grew 70,000 colonies of E. coli and 30,000 colonies of Streptococcus agalactiae. - Status post 3 days of IV Rocephin . - Continue oral vancomycin  for C. difficile colitis. - Supportive care.  2.  Syncope -Felt likely related to hypovolemia in the setting of dehydration secondary to diarrhea and poor oral intake. - 2D echo  done with normal EF of 70 to 75%,NWMA, grade 1 DD. - CT head done with generalized cerebral atrophy and microvascular disease changes of the supratentorial brain.  No acute intracranial abnormalities. - Patient received IV fluids. - 500 cc normal saline bolus x 1 on 05/25/2024. - Continue TED hose.  3.  C. difficile colitis/norovirus gastroenteritis -Patient noted to have a positive C. difficile antigen and PCR. - Consistency and frequency of stools improving. - Continue oral vancomycin . - Supportive care.  4.  Hypokalemia -Secondary to GI losses. - Replete.  5.  History of PUD/GERD -Patient noted to report belching and some chest tightness/chest pain. - Chest tightness felt likely related to dyspepsia. - Cardiac enzymes negative, 2D echo with no wall motion abnormalities. - Patient status post cholecystectomy 2018. - CT abdomen and pelvis done with no biliary duct dilatation. - Continue Carafate . - Due to C. difficile colitis, PPI discontinued and patient placed on Pepcid  twice daily.  - GI cocktail as needed heartburn and chest pain.  6.  Left shoulder pain -Lidocaine  patch.  7.  Hypertension - Norvasc  10 mg daily.   8.  Mild intermittent asthma -Nebs as needed.  9.  Mild dementia -Continue home regimen Aricept  and mirtazapine .  10.  Hypothyroidism - Continue Synthroid .  11.  Hyperlipidemia - Continue Crestor .  12.  CKD stage IIIa -Baseline creatinine approximately 1.2-1.5. -Creatinine stable at 1.21.  13.  RLS -Continue Requip .   DVT prophylaxis: Lovenox  Code Status: Full Family Communication: Updated patient.  No  family at bedside. Disposition: To SNF when bed available.  Status is: Inpatient Remains inpatient appropriate because: Severity of illness   Consultants:  None  Procedures:  CT head CT C-spine 05/21/2024 CT chest abdomen and pelvis 05/21/2024 Plain films of the left shoulder 05/21/2024 Plain films of the C-spine 05/21/2024 Chest x-ray  05/21/2024 2D echo 05/22/2024   Antimicrobials:  Anti-infectives (From admission, onward)    Start     Dose/Rate Route Frequency Ordered Stop   05/22/24 1715  vancomycin  (VANCOCIN ) capsule 125 mg        125 mg Oral 4 times daily 05/22/24 1623 06/01/24 1759   05/21/24 2200  cefTRIAXone  (ROCEPHIN ) 1 g in sodium chloride  0.9 % 100 mL IVPB  Status:  Discontinued        1 g 200 mL/hr over 30 Minutes Intravenous Every 24 hours 05/21/24 1627 05/24/24 0814   05/21/24 1315  ceFEPIme  (MAXIPIME ) 2 g in sodium chloride  0.9 % 100 mL IVPB        2 g 200 mL/hr over 30 Minutes Intravenous  Once 05/21/24 1309 05/21/24 1352   05/21/24 1315  metroNIDAZOLE  (FLAGYL ) IVPB 500 mg        500 mg 100 mL/hr over 60 Minutes Intravenous  Once 05/21/24 1309 05/21/24 1527   05/21/24 1315  vancomycin  (VANCOCIN ) IVPB 1000 mg/200 mL premix        1,000 mg 200 mL/hr over 60 Minutes Intravenous  Once 05/21/24 1309 05/21/24 1527         Subjective: Patient sitting on bedside commode.  States chest pain/chest tightness has improved.  Denies any significant shortness of breath.  No abdominal pain.  Stool frequency and consistency improving.  Patient noted to have 3 bowel movements this morning.  Objective: Vitals:   05/25/24 1323 05/25/24 2102 05/26/24 0633 05/26/24 1237  BP: (!) 146/56 (!) 154/73 (!) 126/56 (!) 174/86  Pulse: 76 93 64 75  Resp: 18 20 18 18   Temp: 98 F (36.7 C) 97.7 F (36.5 C) 98 F (36.7 C) 97.6 F (36.4 C)  TempSrc: Oral Oral  Oral  SpO2: 98% 98% 99% 99%  Weight:      Height:        Intake/Output Summary (Last 24 hours) at 05/26/2024 1250 Last data filed at 05/26/2024 0930 Gross per 24 hour  Intake 220 ml  Output --  Net 220 ml   Filed Weights   05/21/24 2300  Weight: 62.4 kg    Examination:  General exam: NAD Respiratory system: CTAB.  No wheezes, no crackles, no rhonchi.  Fair air movement.  Speaking in full sentences.   Cardiovascular system: RRR no murmurs rubs or gallops.   No JVD.  No pitting lower extremity edema.  Gastrointestinal system: Abdomen is soft, nontender, nondistended, positive bowel sounds.  No rebound.  No guarding.   Central nervous system: Alert and oriented. No focal neurological deficits. Extremities: Symmetric 5 x 5 power. Skin: No rashes, lesions or ulcers Psychiatry: Judgement and insight appear normal. Mood & affect appropriate.     Data Reviewed: I have personally reviewed following labs and imaging studies  CBC: Recent Labs  Lab 05/21/24 1249 05/22/24 0306 05/23/24 0407 05/24/24 0412 05/25/24 0413 05/26/24 0143  WBC 10.9* 13.6* 7.7 9.5 8.9 9.8  NEUTROABS 7.6  --  3.3 4.3 4.4 4.4  HGB 14.8 13.4 14.0 13.5 13.5 13.3  HCT 42.2 38.8 40.3 39.9 40.3 39.9  MCV 85.9 86.8 85.2 89.3 87.6 87.3  PLT 264 231 149* 234  229 229    Basic Metabolic Panel: Recent Labs  Lab 05/21/24 1539 05/22/24 0306 05/23/24 0654 05/24/24 0412 05/25/24 0413 05/26/24 0143  NA  --  140 141 143 140 141  K  --  3.8 2.7* 3.3* 3.4* 3.5  CL  --  100 103 106 104 106  CO2  --  24 21* 21* 22 21*  GLUCOSE  --  127* 180* 105* 126* 142*  BUN  --  14 11 9 10 9   CREATININE  --  1.09* 1.25* 1.15* 1.30* 1.21*  CALCIUM   --  9.5 8.9 9.2 9.3 9.2  MG 1.9  --  1.9 2.0  --  1.9    GFR: Estimated Creatinine Clearance: 31 mL/min (A) (by C-G formula based on SCr of 1.21 mg/dL (H)).  Liver Function Tests: Recent Labs  Lab 05/21/24 1249 05/22/24 0306  AST 43* 40  ALT 56* 46*  ALKPHOS 73 65  BILITOT 0.7 0.6  PROT 7.2 6.4*  ALBUMIN 4.6 4.2    CBG: No results for input(s): GLUCAP in the last 168 hours.   Recent Results (from the past 240 hours)  Urine Culture     Status: Abnormal   Collection Time: 05/21/24 12:41 PM   Specimen: Urine, Random  Result Value Ref Range Status   Specimen Description   Final    URINE, RANDOM Performed at [facility], [address], [place], [place] [address]    Special Requests   Final    NONE  Reflexed from [id number] Performed at [facility], [address] [place], [place] [address]    Culture (A)  Final    70,000 COLONIES/mL ESCHERICHIA COLI 30,000 COLONIES/mL STREPTOCOCCUS AGALACTIAE TESTING AGAINST S. AGALACTIAE NOT ROUTINELY PERFORMED DUE TO PREDICTABILITY OF AMP/PEN/VAN SUSCEPTIBILITY. Performed at [facility], [address] [place], [place] [address]    Report Status 05/23/2024 FINAL  Final   Organism ID, Bacteria ESCHERICHIA COLI (A)  Final      Susceptibility   Escherichia coli - MIC*    AMPICILLIN <=2 SENSITIVE Sensitive     CEFAZOLIN  (URINE) Value in next row Sensitive      <=1 SENSITIVEThis is a modified FDA-approved test that has been validated and its performance characteristics determined by the reporting laboratory.  This laboratory is certified under the Clinical Laboratory Improvement Amendments CLIA as qualified to perform high complexity clinical laboratory testing.    CEFEPIME  Value in next row Sensitive      <=1 SENSITIVEThis is a modified FDA-approved test that has been validated and its performance characteristics determined by the reporting laboratory.  This laboratory is certified under the Clinical Laboratory Improvement Amendments CLIA as qualified to perform high complexity clinical laboratory testing.    ERTAPENEM Value in next row Sensitive      <=1 SENSITIVEThis is a modified FDA-approved test that has been validated and its performance characteristics determined by the reporting laboratory.  This laboratory is certified under the Clinical Laboratory Improvement Amendments CLIA as qualified to perform high complexity clinical laboratory testing.    CEFTRIAXONE  Value in next row Sensitive      <=1 SENSITIVEThis is a modified FDA-approved test that has been validated and its performance characteristics determined by the reporting laboratory.  This laboratory is certified under the Clinical Laboratory Improvement Amendments CLIA  as qualified to perform high complexity clinical laboratory testing.    CIPROFLOXACIN  Value in next row Sensitive      <=1 SENSITIVEThis is a modified  FDA-approved test that has been validated and its performance characteristics determined by the reporting laboratory.  This laboratory is certified under the Clinical Laboratory Improvement Amendments CLIA as qualified to perform high complexity clinical laboratory testing.    GENTAMICIN Value in next row Sensitive      <=1 SENSITIVEThis is a modified FDA-approved test that has been validated and its performance characteristics determined by the reporting laboratory.  This laboratory is certified under the Clinical Laboratory Improvement Amendments CLIA as qualified to perform high complexity clinical laboratory testing.    NITROFURANTOIN Value in next row Sensitive      <=1 SENSITIVEThis is a modified FDA-approved test that has been validated and its performance characteristics determined by the reporting laboratory.  This laboratory is certified under the Clinical Laboratory Improvement Amendments CLIA as qualified to perform high complexity clinical laboratory testing.    TRIMETH /SULFA  Value in next row Sensitive      <=1 SENSITIVEThis is a modified FDA-approved test that has been validated and its performance characteristics determined by the reporting laboratory.  This laboratory is certified under the Clinical Laboratory Improvement Amendments CLIA as qualified to perform high complexity clinical laboratory testing.    AMPICILLIN/SULBACTAM Value in next row Sensitive      <=1 SENSITIVEThis is a modified FDA-approved test that has been validated and its performance characteristics determined by the reporting laboratory.  This laboratory is certified under the Clinical Laboratory Improvement Amendments CLIA as qualified to perform high complexity clinical laboratory testing.    PIP/TAZO Value in next row Sensitive ug/mL     <=4 SENSITIVEThis is a  modified FDA-approved test that has been validated and its performance characteristics determined by the reporting laboratory.  This laboratory is certified under the Clinical Laboratory Improvement Amendments CLIA as qualified to perform high complexity clinical laboratory testing.    MEROPENEM Value in next row Sensitive      <=4 SENSITIVEThis is a modified FDA-approved test that has been validated and its performance characteristics determined by the reporting laboratory.  This laboratory is certified under the Clinical Laboratory Improvement Amendments CLIA as qualified to perform high complexity clinical laboratory testing.    * 70,000 COLONIES/mL ESCHERICHIA COLI  Blood Culture (routine x 2)     Status: None   Collection Time: 05/21/24 12:49 PM   Specimen: BLOOD  Result Value Ref Range Status   Specimen Description   Final    BLOOD RIGHT ANTECUBITAL Performed at [facility], [address] [place], [place] [address]    Special Requests   Final    BOTTLES DRAWN AEROBIC AND ANAEROBIC Blood Culture adequate volume Performed at [facility], [address], [place], [place] [address]    Culture   Final    NO GROWTH 5 DAYS Performed at [facility], [address] [place], [place] [address]    Report Status 05/26/2024 FINAL  Final  Blood Culture (routine x 2)     Status: None   Collection Time: 05/21/24  1:25 PM   Specimen: BLOOD  Result Value Ref Range Status   Specimen Description   Final    BLOOD LEFT ANTECUBITAL Performed at [facility], [address], [place], [place] [address]    Special Requests   Final    BOTTLES DRAWN AEROBIC ONLY Blood Culture results may not be optimal due to an inadequate volume of blood received in culture bottles Performed at [facility], [address] [place], [place]  [address]    Culture   Final    NO GROWTH 5 DAYS Performed at [facility], [address], [place], [place] [address]    Report Status 05/26/2024 FINAL  Final  Resp panel by RT-PCR (RSV, Flu A&B, Covid) Anterior Nasal Swab     Status: None   Collection Time: 05/21/24  1:34 PM   Specimen: Anterior Nasal Swab  Result Value Ref Range Status   SARS Coronavirus 2 by RT PCR NEGATIVE NEGATIVE Final    Comment: (NOTE) SARS-CoV-2 target nucleic acids are NOT DETECTED.  The SARS-CoV-2 RNA is generally detectable in upper respiratory specimens during the acute phase of infection. The lowest concentration of SARS-CoV-2 viral copies this assay can detect is 138 copies/mL. A negative result does not preclude SARS-Cov-2 infection and should not be used as the sole basis for treatment or other patient management decisions. A negative result may occur with  improper specimen collection/handling, submission of specimen other than nasopharyngeal swab, presence of viral mutation(s) within the areas targeted by this assay, and inadequate number of viral copies(<138 copies/mL). A negative result must be combined with clinical observations, patient history, and epidemiological information. The expected result is Negative.  Fact Sheet for Patients:  BloggerCourse.com  Fact Sheet for Healthcare Providers:  SeriousBroker.it  This test is no t yet approved or cleared by the [place]  FDA and  has been authorized for detection and/or diagnosis of SARS-CoV-2 by FDA under an Emergency Use Authorization (EUA). This EUA will remain  in effect (meaning this test can be used) for the duration of the COVID-19 declaration under Section 564(b)(1) of the Act, 21 U.S.C.section 360bbb-3(b)(1), unless the authorization is terminated  or revoked sooner.       Influenza A by PCR NEGATIVE NEGATIVE Final   Influenza B by PCR NEGATIVE NEGATIVE Final    Comment: (NOTE) The Xpert Xpress SARS-CoV-2/FLU/RSV plus assay is intended as an aid in the diagnosis  of influenza from Nasopharyngeal swab specimens and should not be used as a sole basis for treatment. Nasal washings and aspirates are unacceptable for Xpert Xpress SARS-CoV-2/FLU/RSV testing.  Fact Sheet for Patients: BloggerCourse.com  Fact Sheet for Healthcare Providers: SeriousBroker.it  This test is not yet approved or cleared by the [place]  FDA and has been authorized for detection and/or diagnosis of SARS-CoV-2 by FDA under an Emergency Use Authorization (EUA). This EUA will remain in effect (meaning this test can be used) for the duration of the COVID-19 declaration under Section 564(b)(1) of the Act, 21 U.S.C. section 360bbb-3(b)(1), unless the authorization is terminated or revoked.     Resp Syncytial Virus by PCR NEGATIVE NEGATIVE Final    Comment: (NOTE) Fact Sheet for Patients: BloggerCourse.com  Fact Sheet for Healthcare Providers: SeriousBroker.it  This test is not yet approved or cleared by the [place]  FDA and has been authorized for detection and/or diagnosis of SARS-CoV-2 by FDA under an Emergency Use Authorization (EUA). This EUA will remain in effect (meaning this test can be used) for the duration of the COVID-19 declaration under Section 564(b)(1) of the Act, 21 U.S.C. section 360bbb-3(b)(1), unless the authorization is terminated or revoked.  Performed at [facility], [address], [place], [place] [address]   Gastrointestinal Panel by PCR , Stool     Status: Abnormal   Collection Time: 05/22/24  8:36 AM   Specimen: Stool  Result Value Ref Range Status   Campylobacter species NOT DETECTED NOT DETECTED Final  Plesimonas shigelloides NOT DETECTED NOT DETECTED Final   Salmonella species NOT DETECTED NOT DETECTED Final   Yersinia enterocolitica NOT DETECTED NOT DETECTED Final   Vibrio species NOT DETECTED NOT DETECTED  Final   Vibrio cholerae NOT DETECTED NOT DETECTED Final   Enteroaggregative E coli (EAEC) NOT DETECTED NOT DETECTED Final   Enteropathogenic E coli (EPEC) NOT DETECTED NOT DETECTED Final   Enterotoxigenic E coli (ETEC) NOT DETECTED NOT DETECTED Final   Shiga like toxin producing E coli (STEC) NOT DETECTED NOT DETECTED Final   Shigella/Enteroinvasive E coli (EIEC) NOT DETECTED NOT DETECTED Final   Cryptosporidium NOT DETECTED NOT DETECTED Final   Cyclospora cayetanensis NOT DETECTED NOT DETECTED Final   Entamoeba histolytica NOT DETECTED NOT DETECTED Final   Giardia lamblia NOT DETECTED NOT DETECTED Final   Adenovirus F40/41 NOT DETECTED NOT DETECTED Final   Astrovirus NOT DETECTED NOT DETECTED Final   Norovirus GI/GII DETECTED (A) NOT DETECTED Final    Comment: CRITICAL RESULT CALLED TO, READ BACK BY AND VERIFIED WITH: HEATHER BULLINS 05/24/24 1205 TDL    Rotavirus A NOT DETECTED NOT DETECTED Final   Sapovirus (I, II, IV, and V) NOT DETECTED NOT DETECTED Final    Comment: Performed at [facility], [address] [place], [place] [address]  C Difficile Quick Screen w PCR reflex     Status: Abnormal   Collection Time: 05/22/24  8:36 AM   Specimen: Stool  Result Value Ref Range Status   C Diff antigen POSITIVE (A) NEGATIVE Final   C Diff toxin NEGATIVE NEGATIVE Final   C Diff interpretation Results are indeterminate. See PCR results.  Final    Comment: Performed at [facility], [address], [place], [place] [address]  C. Diff by PCR, Reflexed     Status: Abnormal   Collection Time: 05/22/24  8:36 AM  Result Value Ref Range Status   Toxigenic C. Difficile by PCR POSITIVE (A) NEGATIVE Final    Comment: Positive for toxigenic C. difficile with little to no toxin production. Only treat if clinical presentation suggests symptomatic illness.   Hypervirulent Strain PRESUMPTIVE NEGATIVE PRESUMPTIVE NEGATIVE Final    Comment: Performed at [facility], [address], [place], [place] [address]         Radiology Studies: No results found.      Scheduled Meds:  amLODipine   10 mg Oral Daily   donepezil   10 mg Oral Daily   enoxaparin  (LOVENOX ) injection  40 mg Subcutaneous Q24H   famotidine   40 mg Oral BID   fluticasone   2 spray Each Nare Daily   levothyroxine   75 mcg Oral Q0600   lidocaine   1 patch Transdermal Q24H   mirtazapine   15 mg Oral QHS   rOPINIRole   0.25 mg Oral QHS   rosuvastatin   10 mg Oral Daily   vancomycin   125 mg Oral QID   Continuous Infusions:   LOS: 5 days    Time spent: 35 minutes    Toribio Hummer, MD [facility]   To contact the attending provider between 7A-7P or the covering provider during after hours 7P-7A, please log into the web site [email or web address] and access using universal [place] password for that web site. If you do not have the password, please call the hospital operator.  05/26/2024, 12:50 PM

## 2024-05-26 NOTE — Progress Notes (Signed)
 Pt's family member requesting to transport her by personal vehicle to SNF once medically stable for d/c. Does not want to use EMS for transport.

## 2024-05-26 NOTE — Progress Notes (Signed)
 30 Day PASRR Note   Patient Details  Name: Elizabeth Mcintyre Date of Birth: [birth year]   Transition of Care [facility]) CM/SW Contact:    [name], LCSW Phone Number: 05/26/2024, 11:12 AM  To Whom It May Concern:  Please be advised that this patient will require a short-term nursing home stay - anticipated 30 days or less for rehabilitation and strengthening.   The plan is for return home.

## 2024-05-27 DIAGNOSIS — N390 Urinary tract infection, site not specified: Secondary | ICD-10-CM | POA: Diagnosis not present

## 2024-05-27 DIAGNOSIS — A419 Sepsis, unspecified organism: Secondary | ICD-10-CM | POA: Diagnosis not present

## 2024-05-27 DIAGNOSIS — R55 Syncope and collapse: Secondary | ICD-10-CM | POA: Diagnosis not present

## 2024-05-27 DIAGNOSIS — E785 Hyperlipidemia, unspecified: Secondary | ICD-10-CM | POA: Diagnosis not present

## 2024-05-27 LAB — CBC WITH DIFFERENTIAL/PLATELET
Abs Immature Granulocytes: 0.06 K/uL (ref 0.00–0.07)
Basophils Absolute: 0.1 K/uL (ref 0.0–0.1)
Basophils Relative: 1 %
Eosinophils Absolute: 0.6 K/uL — ABNORMAL HIGH (ref 0.0–0.5)
Eosinophils Relative: 5 %
HCT: 40.6 % (ref 36.0–46.0)
Hemoglobin: 14.1 g/dL (ref 12.0–15.0)
Immature Granulocytes: 1 %
Lymphocytes Relative: 32 %
Lymphs Abs: 3.4 K/uL (ref 0.7–4.0)
MCH: 30.3 pg (ref 26.0–34.0)
MCHC: 34.7 g/dL (ref 30.0–36.0)
MCV: 87.1 fL (ref 80.0–100.0)
Monocytes Absolute: 0.7 K/uL (ref 0.1–1.0)
Monocytes Relative: 7 %
Neutro Abs: 5.9 K/uL (ref 1.7–7.7)
Neutrophils Relative %: 54 %
Platelets: 223 K/uL (ref 150–400)
RBC: 4.66 MIL/uL (ref 3.87–5.11)
RDW: 13.2 % (ref 11.5–15.5)
WBC: 10.7 K/uL — ABNORMAL HIGH (ref 4.0–10.5)
nRBC: 0 % (ref 0.0–0.2)

## 2024-05-27 LAB — BASIC METABOLIC PANEL WITH GFR
Anion gap: 14 (ref 5–15)
BUN: 9 mg/dL (ref 8–23)
CO2: 21 mmol/L — ABNORMAL LOW (ref 22–32)
Calcium: 9.5 mg/dL (ref 8.9–10.3)
Chloride: 106 mmol/L (ref 98–111)
Creatinine, Ser: 1.18 mg/dL — ABNORMAL HIGH (ref 0.44–1.00)
GFR, Estimated: 46 mL/min — ABNORMAL LOW (ref >60)
Glucose, Bld: 127 mg/dL — ABNORMAL HIGH (ref 70–99)
Potassium: 3.5 mmol/L (ref 3.5–5.1)
Sodium: 141 mmol/L (ref 135–145)

## 2024-05-27 LAB — MAGNESIUM: Magnesium: 1.9 mg/dL (ref 1.7–2.4)

## 2024-05-27 MED ORDER — POTASSIUM CHLORIDE CRYS ER 20 MEQ PO TBCR
40.0000 meq | EXTENDED_RELEASE_TABLET | Freq: Once | ORAL | Status: AC
  Administered 2024-05-27: 40 meq via ORAL
  Filled 2024-05-27: qty 2

## 2024-05-27 NOTE — TOC Progression Note (Signed)
 Transition of Care [facility]) - Progression Note    Patient Details  Name: Elizabeth Mcintyre MRN: [id number] Date of Birth: 1940-11-28  [facility]) CM/SW Contact  [name], LCSW Phone Number: 05/27/2024, 9:13 AM  Clinical Narrative:     PASRR number still pending. IPCM to follow.   Expected Discharge Plan: Skilled Nursing Facility Barriers to Discharge: Continued Medical Work up               Expected Discharge Plan and Services                                               Social Drivers of Health (SDOH) Interventions SDOH Screenings   Food Insecurity: No Food Insecurity (05/21/2024)  Housing: Low Risk  (05/21/2024)  Transportation Needs: No Transportation Needs (05/21/2024)  Utilities: Not At Risk (05/21/2024)  Alcohol Screen: Low Risk  (06/04/2021)  Depression (PHQ2-9): Low Risk  (08/03/2023)  Financial Resource Strain: Low Risk  (06/04/2021)  Physical Activity: Insufficiently Active (05/27/2023)  Social Connections: Moderately Integrated (05/21/2024)  Stress: Stress Concern Present (05/27/2023)  Tobacco Use: Low Risk  (05/23/2024)    Readmission Risk Interventions    05/19/2023    2:38 PM  Readmission Risk Prevention Plan  Post Dischage Appt Complete  Medication Screening Complete  Transportation Screening Complete

## 2024-05-27 NOTE — Progress Notes (Signed)
 PROGRESS NOTE    Elizabeth Mcintyre  FMW:[id number] DOB: [birth year] DOA: 05/21/2024 PCP: [name], MD   Chief Complaint  Patient presents with   Loss of Consciousness    Brief Narrative:  83 year old with past medical history significant for asthma, hypertension, hypothyroidism, dementia, B12 deficiency, RLS, peripheral neuropathy, GERD, gout, hyperlipidemia, carotid artery stenosis presents complaining of syncope and history of vomiting and diarrhea for 2 weeks.  Patient also has been having chest discomfort for the last 2 or 3 weeks.  She has a history of peptic ulcer disease and used to take Carafate .  She is compliant taking her PPI at home.  UA was concerning for infection and she was admitted for further workup.   Patient was found to have C. difficile colitis.  In regard to her chest pain, could be  GI in origin related to her reflux.  She was started on Carafate   and PPI. Diarrhea improving.    Assessment & Plan:   Principal Problem:   Severe sepsis from UTI Active Problems:   Syncope   Acute pain of left shoulder   Diarrhea   Essential hypertension   Mild intermittent asthma   Mild dementia, unclear etiology   Hypothyroidism   Gastroesophageal reflux disease   Dyslipidemia   Stage 3a chronic kidney disease   Restless legs syndrome   UTI (urinary tract infection)  #1 severe sepsis secondary to UTI and C. difficile colitis and norovirus - Patient noted to have met criteria on admission for severe sepsis with tachycardia, lactic acidosis of 4.8, hypothermia, source of infection noted to be UTI as well as C. difficile colitis. - Urine cultures grew 70,000 colonies of E. coli and 30,000 colonies of Streptococcus agalactiae. - Status post 3 days of IV Rocephin . - Continue oral vancomycin  for C. difficile colitis. - Supportive care.  2.  Syncope -Felt likely related to hypovolemia in the setting of dehydration secondary to diarrhea and poor oral intake. - 2D echo  done with normal EF of 70 to 75%,NWMA, grade 1 DD. - CT head done with generalized cerebral atrophy and microvascular disease changes of the supratentorial brain.  No acute intracranial abnormalities. - Patient received IV fluids. - 500 cc normal saline bolus x 1 on 05/25/2024. - Continue TED hose.  3.  C. difficile colitis/norovirus gastroenteritis -Patient noted to have a positive C. difficile antigen and PCR. - Consistency and frequency of stools improving. - Continue oral vancomycin . - Supportive care.  4.  Hypokalemia -Secondary to GI losses. - Repleted.  5.  History of PUD/GERD -Patient noted to report belching and some chest tightness/chest pain. - Chest tightness felt likely related to dyspepsia. - Cardiac enzymes negative, 2D echo with no wall motion abnormalities. - Patient status post cholecystectomy 2018. - CT abdomen and pelvis done with no biliary duct dilatation. - Continue Carafate . - Due to C. difficile colitis, PPI discontinued and patient placed on Pepcid  twice daily.  - Continue GI cocktail as needed heartburn and chest pain.  6.  Left shoulder pain - Continue lidocaine  patch.  7.  Hypertension - Controlled on current regimen of Norvasc  10 mg daily.  8.  Mild intermittent asthma -Nebs as needed.  9.  Mild dementia -Continue home regimen Aricept  and mirtazapine .  10.  Hypothyroidism - Synthroid .  11.  Hyperlipidemia - Crestor .   12.  CKD stage IIIa -Baseline creatinine approximately 1.2-1.5. -Creatinine stable at 1.18.  13.  RLS - Requip .     DVT prophylaxis: Lovenox  Code  Status: Full Family Communication: Updated patient and daughter at bedside.  Disposition: Medically stable.  Awaiting PASRR number in order to discharge to SNF.  Status is: Inpatient Remains inpatient appropriate because: Severity of illness   Consultants:  None  Procedures:  CT head CT C-spine 05/21/2024 CT chest abdomen and pelvis 05/21/2024 Plain films of the left  shoulder 05/21/2024 Plain films of the C-spine 05/21/2024 Chest x-ray 05/21/2024 2D echo 05/22/2024   Antimicrobials:  Anti-infectives (From admission, onward)    Start     Dose/Rate Route Frequency Ordered Stop   05/22/24 1715  vancomycin  (VANCOCIN ) capsule 125 mg        125 mg Oral 4 times daily 05/22/24 1623 06/01/24 1759   05/21/24 2200  cefTRIAXone  (ROCEPHIN ) 1 g in sodium chloride  0.9 % 100 mL IVPB  Status:  Discontinued        1 g 200 mL/hr over 30 Minutes Intravenous Every 24 hours 05/21/24 1627 05/24/24 0814   05/21/24 1315  ceFEPIme  (MAXIPIME ) 2 g in sodium chloride  0.9 % 100 mL IVPB        2 g 200 mL/hr over 30 Minutes Intravenous  Once 05/21/24 1309 05/21/24 1352   05/21/24 1315  metroNIDAZOLE  (FLAGYL ) IVPB 500 mg        500 mg 100 mL/hr over 60 Minutes Intravenous  Once 05/21/24 1309 05/21/24 1527   05/21/24 1315  vancomycin  (VANCOCIN ) IVPB 1000 mg/200 mL premix        1,000 mg 200 mL/hr over 60 Minutes Intravenous  Once 05/21/24 1309 05/21/24 1527         Subjective: Sitting up in bed eating lunch.  Still with some chest pain/tightness which seems to have improved with a GI cocktail yesterday.  Denies any significant shortness of breath.  No significant abdominal pain.  States had 1 liquid bowel movement this morning.  Daughter at bedside.    Objective: Vitals:   05/26/24 0633 05/26/24 1237 05/26/24 1958 05/27/24 0659  BP: (!) 126/56 (!) 174/86 132/66 134/68  Pulse: 64 75 82 71  Resp: 18 18 18 18   Temp: 98 F (36.7 C) 97.6 F (36.4 C) 98.2 F (36.8 C) 98.8 F (37.1 C)  TempSrc:  Oral Oral Oral  SpO2: 99% 99% 99% 99%  Weight:      Height:        Intake/Output Summary (Last 24 hours) at 05/27/2024 1241 Last data filed at 05/27/2024 0600 Gross per 24 hour  Intake 100 ml  Output --  Net 100 ml   Filed Weights   05/21/24 2300  Weight: 62.4 kg    Examination:  General exam: NAD Respiratory system: Lungs clear to auscultation bilaterally.  No wheezes, no  crackles, no rhonchi.  Fair air movement.  Speaking in full sentences.   Cardiovascular system: Regular rate and rhythm no murmurs rubs or gallops.  No JVD.  No pitting lower extremity edema.  Gastrointestinal system: Abdomen soft, nontender, nondistended, positive bowel sounds.  No rebound.  No guarding. Central nervous system: Alert and oriented. No focal neurological deficits. Extremities: Symmetric 5 x 5 power. Skin: No rashes, lesions or ulcers Psychiatry: Judgement and insight appear normal. Mood & affect appropriate.     Data Reviewed: I have personally reviewed following labs and imaging studies  CBC: Recent Labs  Lab 05/23/24 0407 05/24/24 0412 05/25/24 0413 05/26/24 0143 05/27/24 0352  WBC 7.7 9.5 8.9 9.8 10.7*  NEUTROABS 3.3 4.3 4.4 4.4 5.9  HGB 14.0 13.5 13.5 13.3 14.1  HCT  40.3 39.9 40.3 39.9 40.6  MCV 85.2 89.3 87.6 87.3 87.1  PLT 149* 234 229 229 223    Basic Metabolic Panel: Recent Labs  Lab 05/21/24 1539 05/22/24 0306 05/23/24 0654 05/24/24 0412 05/25/24 0413 05/26/24 0143 05/27/24 0352  NA  --    < > 141 143 140 141 141  K  --    < > 2.7* 3.3* 3.4* 3.5 3.5  CL  --    < > 103 106 104 106 106  CO2  --    < > 21* 21* 22 21* 21*  GLUCOSE  --    < > 180* 105* 126* 142* 127*  BUN  --    < > 11 9 10 9 9   CREATININE  --    < > 1.25* 1.15* 1.30* 1.21* 1.18*  CALCIUM   --    < > 8.9 9.2 9.3 9.2 9.5  MG 1.9  --  1.9 2.0  --  1.9 1.9   < > = values in this interval not displayed.    GFR: Estimated Creatinine Clearance: 31.7 mL/min (A) (by C-G formula based on SCr of 1.18 mg/dL (H)).  Liver Function Tests: Recent Labs  Lab 05/21/24 1249 05/22/24 0306  AST 43* 40  ALT 56* 46*  ALKPHOS 73 65  BILITOT 0.7 0.6  PROT 7.2 6.4*  ALBUMIN 4.6 4.2    CBG: Recent Labs  Lab 05/26/24 1959  GLUCAP 129*     Recent Results (from the past 240 hours)  Urine Culture     Status: Abnormal   Collection Time: 05/21/24 12:41 PM   Specimen: Urine, Random   Result Value Ref Range Status   Specimen Description   Final    URINE, RANDOM Performed at [facility], [address] [place], [place] [address]    Special Requests   Final    NONE Reflexed from [id number] Performed at [facility], [address] [place], [place] [address]    Culture (A)  Final    70,000 COLONIES/mL ESCHERICHIA COLI 30,000 COLONIES/mL STREPTOCOCCUS AGALACTIAE TESTING AGAINST S. AGALACTIAE NOT ROUTINELY PERFORMED DUE TO PREDICTABILITY OF AMP/PEN/VAN SUSCEPTIBILITY. Performed at [facility], [address] [place], [place] [address]    Report Status 05/23/2024 FINAL  Final   Organism ID, Bacteria ESCHERICHIA COLI (A)  Final      Susceptibility   Escherichia coli - MIC*    AMPICILLIN <=2 SENSITIVE Sensitive     CEFAZOLIN  (URINE) Value in next row Sensitive      <=1 SENSITIVEThis is a modified FDA-approved test that has been validated and its performance characteristics determined by the reporting laboratory.  This laboratory is certified under the Clinical Laboratory Improvement Amendments CLIA as qualified to perform high complexity clinical laboratory testing.    CEFEPIME  Value in next row Sensitive      <=1 SENSITIVEThis is a modified FDA-approved test that has been validated and its performance characteristics determined by the reporting laboratory.  This laboratory is certified under the Clinical Laboratory Improvement Amendments CLIA as qualified to perform high complexity clinical laboratory testing.    ERTAPENEM Value in next row Sensitive      <=1 SENSITIVEThis is a modified FDA-approved test that has been validated and its performance characteristics determined by the reporting laboratory.  This laboratory is certified under the Clinical Laboratory Improvement Amendments CLIA as qualified to perform high complexity clinical laboratory testing.    CEFTRIAXONE  Value in next row Sensitive      <=  1 SENSITIVEThis is a  modified FDA-approved test that has been validated and its performance characteristics determined by the reporting laboratory.  This laboratory is certified under the Clinical Laboratory Improvement Amendments CLIA as qualified to perform high complexity clinical laboratory testing.    CIPROFLOXACIN  Value in next row Sensitive      <=1 SENSITIVEThis is a modified FDA-approved test that has been validated and its performance characteristics determined by the reporting laboratory.  This laboratory is certified under the Clinical Laboratory Improvement Amendments CLIA as qualified to perform high complexity clinical laboratory testing.    GENTAMICIN Value in next row Sensitive      <=1 SENSITIVEThis is a modified FDA-approved test that has been validated and its performance characteristics determined by the reporting laboratory.  This laboratory is certified under the Clinical Laboratory Improvement Amendments CLIA as qualified to perform high complexity clinical laboratory testing.    NITROFURANTOIN Value in next row Sensitive      <=1 SENSITIVEThis is a modified FDA-approved test that has been validated and its performance characteristics determined by the reporting laboratory.  This laboratory is certified under the Clinical Laboratory Improvement Amendments CLIA as qualified to perform high complexity clinical laboratory testing.    TRIMETH /SULFA  Value in next row Sensitive      <=1 SENSITIVEThis is a modified FDA-approved test that has been validated and its performance characteristics determined by the reporting laboratory.  This laboratory is certified under the Clinical Laboratory Improvement Amendments CLIA as qualified to perform high complexity clinical laboratory testing.    AMPICILLIN/SULBACTAM Value in next row Sensitive      <=1 SENSITIVEThis is a modified FDA-approved test that has been validated and its performance characteristics determined by the reporting laboratory.  This laboratory is  certified under the Clinical Laboratory Improvement Amendments CLIA as qualified to perform high complexity clinical laboratory testing.    PIP/TAZO Value in next row Sensitive ug/mL     <=4 SENSITIVEThis is a modified FDA-approved test that has been validated and its performance characteristics determined by the reporting laboratory.  This laboratory is certified under the Clinical Laboratory Improvement Amendments CLIA as qualified to perform high complexity clinical laboratory testing.    MEROPENEM Value in next row Sensitive      <=4 SENSITIVEThis is a modified FDA-approved test that has been validated and its performance characteristics determined by the reporting laboratory.  This laboratory is certified under the Clinical Laboratory Improvement Amendments CLIA as qualified to perform high complexity clinical laboratory testing.    * 70,000 COLONIES/mL ESCHERICHIA COLI  Blood Culture (routine x 2)     Status: None   Collection Time: 05/21/24 12:49 PM   Specimen: BLOOD  Result Value Ref Range Status   Specimen Description   Final    BLOOD RIGHT ANTECUBITAL Performed at [facility], [address], [place], [place] [address]    Special Requests   Final    BOTTLES DRAWN AEROBIC AND ANAEROBIC Blood Culture adequate volume Performed at [facility], [address], [place], [place] [address]    Culture   Final    NO GROWTH 5 DAYS Performed at [facility], [address], [place], [place] [address]    Report Status 05/26/2024 FINAL  Final  Blood Culture (routine x 2)     Status: None   Collection Time: 05/21/24  1:25 PM   Specimen: BLOOD  Result Value Ref Range Status   Specimen Description   Final  BLOOD LEFT ANTECUBITAL Performed at [facility], [address], [place], [place] [address]    Special Requests   Final    BOTTLES DRAWN AEROBIC ONLY Blood Culture results may not be optimal due to an inadequate volume of  blood received in culture bottles Performed at [facility], [address], [place], [place] [address]    Culture   Final    NO GROWTH 5 DAYS Performed at [facility] [place] [facility], [address] [place], [place] [address]    Report Status 05/26/2024 FINAL  Final  Resp panel by RT-PCR (RSV, Flu A&B, Covid) Anterior Nasal Swab     Status: None   Collection Time: 05/21/24  1:34 PM   Specimen: Anterior Nasal Swab  Result Value Ref Range Status   SARS Coronavirus 2 by RT PCR NEGATIVE NEGATIVE Final    Comment: (NOTE) SARS-CoV-2 target nucleic acids are NOT DETECTED.  The SARS-CoV-2 RNA is generally detectable in upper respiratory specimens during the acute phase of infection. The lowest concentration of SARS-CoV-2 viral copies this assay can detect is 138 copies/mL. A negative result does not preclude SARS-Cov-2 infection and should not be used as the sole basis for treatment or other patient management decisions. A negative result may occur with  improper specimen collection/handling, submission of specimen other than nasopharyngeal swab, presence of viral mutation(s) within the areas targeted by this assay, and inadequate number of viral copies(<138 copies/mL). A negative result must be combined with clinical observations, patient history, and epidemiological information. The expected result is Negative.  Fact Sheet for Patients:  BloggerCourse.com  Fact Sheet for Healthcare Providers:  SeriousBroker.it  This test is no t yet approved or cleared by the [place]  FDA and  has been authorized for detection and/or diagnosis of SARS-CoV-2 by FDA under an Emergency Use Authorization (EUA). This EUA will remain  in effect (meaning this test can be used) for the duration of the COVID-19 declaration under Section 564(b)(1) of the Act, 21 U.S.C.section 360bbb-3(b)(1), unless the authorization is terminated  or revoked  sooner.       Influenza A by PCR NEGATIVE NEGATIVE Final   Influenza B by PCR NEGATIVE NEGATIVE Final    Comment: (NOTE) The Xpert Xpress SARS-CoV-2/FLU/RSV plus assay is intended as an aid in the diagnosis of influenza from Nasopharyngeal swab specimens and should not be used as a sole basis for treatment. Nasal washings and aspirates are unacceptable for Xpert Xpress SARS-CoV-2/FLU/RSV testing.  Fact Sheet for Patients: BloggerCourse.com  Fact Sheet for Healthcare Providers: SeriousBroker.it  This test is not yet approved or cleared by the [place]  FDA and has been authorized for detection and/or diagnosis of SARS-CoV-2 by FDA under an Emergency Use Authorization (EUA). This EUA will remain in effect (meaning this test can be used) for the duration of the COVID-19 declaration under Section 564(b)(1) of the Act, 21 U.S.C. section 360bbb-3(b)(1), unless the authorization is terminated or revoked.     Resp Syncytial Virus by PCR NEGATIVE NEGATIVE Final    Comment: (NOTE) Fact Sheet for Patients: BloggerCourse.com  Fact Sheet for Healthcare Providers: SeriousBroker.it  This test is not yet approved or cleared by the [place]  FDA and has been authorized for detection and/or diagnosis of SARS-CoV-2 by FDA under an Emergency Use Authorization (EUA). This EUA will remain in effect (meaning this test can be used) for the duration of the COVID-19 declaration under Section 564(b)(1) of the Act, 21 U.S.C. section 360bbb-3(b)(1), unless the authorization  is terminated or revoked.  Performed at [facility], [address], [place], [place] [address]   Gastrointestinal Panel by PCR , Stool     Status: Abnormal   Collection Time: 05/22/24  8:36 AM   Specimen: Stool  Result Value Ref Range Status   Campylobacter species NOT DETECTED NOT DETECTED Final    Plesimonas shigelloides NOT DETECTED NOT DETECTED Final   Salmonella species NOT DETECTED NOT DETECTED Final   Yersinia enterocolitica NOT DETECTED NOT DETECTED Final   Vibrio species NOT DETECTED NOT DETECTED Final   Vibrio cholerae NOT DETECTED NOT DETECTED Final   Enteroaggregative E coli (EAEC) NOT DETECTED NOT DETECTED Final   Enteropathogenic E coli (EPEC) NOT DETECTED NOT DETECTED Final   Enterotoxigenic E coli (ETEC) NOT DETECTED NOT DETECTED Final   Shiga like toxin producing E coli (STEC) NOT DETECTED NOT DETECTED Final   Shigella/Enteroinvasive E coli (EIEC) NOT DETECTED NOT DETECTED Final   Cryptosporidium NOT DETECTED NOT DETECTED Final   Cyclospora cayetanensis NOT DETECTED NOT DETECTED Final   Entamoeba histolytica NOT DETECTED NOT DETECTED Final   Giardia lamblia NOT DETECTED NOT DETECTED Final   Adenovirus F40/41 NOT DETECTED NOT DETECTED Final   Astrovirus NOT DETECTED NOT DETECTED Final   Norovirus GI/GII DETECTED (A) NOT DETECTED Final    Comment: CRITICAL RESULT CALLED TO, READ BACK BY AND VERIFIED WITH: HEATHER BULLINS 05/24/24 1205 TDL    Rotavirus A NOT DETECTED NOT DETECTED Final   Sapovirus (I, II, IV, and V) NOT DETECTED NOT DETECTED Final    Comment: Performed at [facility], [address] [place], [place] [address]  C Difficile Quick Screen w PCR reflex     Status: Abnormal   Collection Time: 05/22/24  8:36 AM   Specimen: Stool  Result Value Ref Range Status   C Diff antigen POSITIVE (A) NEGATIVE Final   C Diff toxin NEGATIVE NEGATIVE Final   C Diff interpretation Results are indeterminate. See PCR results.  Final    Comment: Performed at [facility], [address] [place], [place] [address]  C. Diff by PCR, Reflexed     Status: Abnormal   Collection Time: 05/22/24  8:36 AM  Result Value Ref Range Status   Toxigenic C. Difficile by PCR POSITIVE (A) NEGATIVE Final    Comment: Positive for toxigenic C. difficile with  little to no toxin production. Only treat if clinical presentation suggests symptomatic illness.   Hypervirulent Strain PRESUMPTIVE NEGATIVE PRESUMPTIVE NEGATIVE Final    Comment: Performed at [facility], [address] [place], [place] [address]         Radiology Studies: No results found.      Scheduled Meds:  amLODipine   10 mg Oral Daily   donepezil   10 mg Oral Daily   enoxaparin  (LOVENOX ) injection  40 mg Subcutaneous Q24H   famotidine   40 mg Oral BID   fluticasone   2 spray Each Nare Daily   levothyroxine   75 mcg Oral Q0600   lidocaine   1 patch Transdermal Q24H   mirtazapine   15 mg Oral QHS   rOPINIRole   0.25 mg Oral QHS   rosuvastatin   10 mg Oral Daily   vancomycin   125 mg Oral QID   Continuous Infusions:   LOS: 6 days    Time spent: 35 minutes    Toribio Hummer, MD [facility]   To contact the attending provider between 7A-7P or the covering provider during after hours 7P-7A, please  log into the web site [email or web address] and access using universal [place] password for that web site. If you do not have the password, please call the hospital operator.  05/27/2024, 12:41 PM

## 2024-05-28 ENCOUNTER — Other Ambulatory Visit (HOSPITAL_COMMUNITY): Payer: Self-pay

## 2024-05-28 DIAGNOSIS — E039 Hypothyroidism, unspecified: Secondary | ICD-10-CM | POA: Diagnosis not present

## 2024-05-28 DIAGNOSIS — R55 Syncope and collapse: Secondary | ICD-10-CM | POA: Diagnosis not present

## 2024-05-28 DIAGNOSIS — A0472 Enterocolitis due to Clostridium difficile, not specified as recurrent: Secondary | ICD-10-CM

## 2024-05-28 DIAGNOSIS — A419 Sepsis, unspecified organism: Secondary | ICD-10-CM | POA: Diagnosis not present

## 2024-05-28 DIAGNOSIS — G309 Alzheimer's disease, unspecified: Secondary | ICD-10-CM | POA: Diagnosis not present

## 2024-05-28 LAB — BASIC METABOLIC PANEL WITH GFR
Anion gap: 12 (ref 5–15)
BUN: 13 mg/dL (ref 8–23)
CO2: 21 mmol/L — ABNORMAL LOW (ref 22–32)
Calcium: 9.9 mg/dL (ref 8.9–10.3)
Chloride: 107 mmol/L (ref 98–111)
Creatinine, Ser: 1.3 mg/dL — ABNORMAL HIGH (ref 0.44–1.00)
GFR, Estimated: 41 mL/min — ABNORMAL LOW (ref >60)
Glucose, Bld: 123 mg/dL — ABNORMAL HIGH (ref 70–99)
Potassium: 4.4 mmol/L (ref 3.5–5.1)
Sodium: 140 mmol/L (ref 135–145)

## 2024-05-28 LAB — MAGNESIUM: Magnesium: 2.2 mg/dL (ref 1.7–2.4)

## 2024-05-28 MED ORDER — VANCOMYCIN HCL 125 MG PO CAPS: 125.0000 mg | ORAL_CAPSULE | Freq: Four times a day (QID) | ORAL | 0 refills | Status: AC

## 2024-05-28 MED ORDER — LIDOCAINE 5 % EX PTCH
1.0000 | MEDICATED_PATCH | CUTANEOUS | 0 refills | Status: DC
  Filled 2024-05-28: qty 30, 30d supply, fill #0

## 2024-05-28 MED ORDER — FAMOTIDINE 40 MG PO TABS: ORAL_TABLET | ORAL | 0 refills | Status: DC

## 2024-05-28 MED ORDER — VANCOMYCIN HCL 125 MG PO CAPS
125.0000 mg | ORAL_CAPSULE | Freq: Four times a day (QID) | ORAL | 0 refills | Status: DC
  Filled 2024-05-28: qty 20, 5d supply, fill #0

## 2024-05-28 MED ORDER — FAMOTIDINE 40 MG PO TABS
ORAL_TABLET | ORAL | 0 refills | Status: DC
  Filled 2024-05-28: qty 90, 60d supply, fill #0

## 2024-05-28 MED ORDER — LIDOCAINE 5 % EX PTCH: 1.0000 | MEDICATED_PATCH | CUTANEOUS | 0 refills | Status: AC

## 2024-05-28 NOTE — Progress Notes (Signed)
 Physical Therapy Treatment Patient Details Name: Elizabeth Mcintyre MRN: [id number] DOB: [birth year] Today's Date: 05/28/2024   History of Present Illness 83 yo female admitted with UTI, severe sepsis, syncope, fall.  Work up findings: severe sepsis secondary to UTI and C. difficile colitis and norovirus. Hx of dementia, CKD, lymphoma, RLS, GERD, hypothyroidism, neuropathy, gout.    PT Comments  Pt used bathroom and washed hands without physical assist.  Pt then able to ambulate entire unit without assistive device.  Pt did not require any physical assist.  Pt's mobility has improved and cognition appears at baseline (hx dementia).  Daughter present and reports pt is doing better, and she would feel comfortable with pt going home with her.  Updated d/c recommendations from SNF to HHPT and RN notified (plans to contact MD as pt can likely d/c today since not awaiting insurance/SNF).    If plan is discharge home, recommend the following: Assistance with cooking/housework;Supervision due to cognitive status;Help with stairs or ramp for entrance;Direct supervision/assist for medications management;Direct supervision/assist for financial management;Assist for transportation;A little help with bathing/dressing/bathroom;A little help with walking and/or transfers   Can travel by private vehicle        Equipment Recommendations  None recommended by PT    Recommendations for Other Services       Precautions / Restrictions Precautions Precautions: Fall Recall of Precautions/Restrictions: Impaired Precaution/Restrictions Comments: hx dementia     Mobility  Bed Mobility Overal bed mobility: Needs Assistance Bed Mobility: Supine to Sit, Sit to Supine     Supine to sit: Supervision Sit to supine: Supervision        Transfers Overall transfer level: Needs assistance Equipment used: None Transfers: Sit to/from Stand Sit to Stand: Supervision           General transfer comment:  supervision for safety    Ambulation/Gait Ambulation/Gait assistance: Contact guard assist, Supervision Gait Distance (Feet): 400 Feet Assistive device: Rolling walker (2 wheels) Gait Pattern/deviations: Step-through pattern, Decreased stride length       General Gait Details: steady without UE support, no significant complaints during ambulation however reports chest pain with return to bed (daughter reports nothing new, likely from indigestion)   Stairs             Wheelchair Mobility     Tilt Bed    Modified Rankin (Stroke Patients Only)       Balance           Standing balance support: No upper extremity supported Standing balance-Leahy Scale: Good Standing balance comment: able to use bathroom and wash hands without any assist                            Communication Communication Communication: No apparent difficulties  Cognition Arousal: Alert Behavior During Therapy: WFL for tasks assessed/performed   PT - Cognitive impairments: History of cognitive impairments                         Following commands: Intact Following commands impaired: Follows one step commands with increased time    Cueing Cueing Techniques: Verbal cues  Exercises      General Comments        Pertinent Vitals/Pain Pain Assessment Pain Assessment: Faces Faces Pain Scale: Hurts little more Pain Location: headache/chest pain Pain Descriptors / Indicators: Aching, Discomfort, Throbbing, Tightness Pain Intervention(s): Repositioned, Monitored during session (daughter reports nothing new)  Home Living                          Prior Function            PT Goals (current goals can now be found in the care plan section) Progress towards PT goals: Progressing toward goals    Frequency    Min 3X/week      PT Plan      Co-evaluation              AM-PAC PT 6 Clicks Mobility   Outcome Measure  Help needed turning  from your back to your side while in a flat bed without using bedrails?: None Help needed moving from lying on your back to sitting on the side of a flat bed without using bedrails?: A Little Help needed moving to and from a bed to a chair (including a wheelchair)?: A Little Help needed standing up from a chair using your arms (e.g., wheelchair or bedside chair)?: A Little Help needed to walk in hospital room?: A Little Help needed climbing 3-5 steps with a railing? : A Little 6 Click Score: 19    End of Session Equipment Utilized During Treatment: Gait belt Activity Tolerance: Patient tolerated treatment well Patient left: in bed;with call bell/phone within reach;with family/visitor present Nurse Communication: Mobility status PT Visit Diagnosis: Difficulty in walking, not elsewhere classified (R26.2)     Time: 1454-1510 PT Time Calculation (min) (ACUTE ONLY): 16 min  Charges:    $Gait Training: 8-22 mins PT General Charges $$ ACUTE PT VISIT: 1 Visit                    Tari KLEIN, DPT Physical Therapist Acute Rehabilitation Services Office: [phone]    Tari CROME [place] 05/28/2024, 4:20 PM

## 2024-05-28 NOTE — Discharge Summary (Addendum)
 Physician Discharge Summary  Elizabeth Mcintyre FMW:[id number] DOB: 12/26/1940 DOA: 05/21/2024  PCP: Elizabeth Suzzane POUR, MD  Admit date: 05/21/2024 Discharge date: 05/28/2024  Time spent: 60 minutes  Recommendations for Outpatient Follow-up:  Follow-up with Elizabeth Suzzane POUR, MD in 2 weeks.  On follow-up will need a basic metabolic profile and magnesium  level done to follow-up on electrolytes and renal function.  Will also need CBC done.   Discharge Diagnoses:  Principal Problem:   Severe sepsis from UTI Active Problems:   Syncope   Acute pain of left shoulder   Diarrhea   Essential hypertension   Mild intermittent asthma   Mild dementia, unclear etiology   Hypothyroidism   Gastroesophageal reflux disease   Dyslipidemia   Stage 3a chronic kidney disease   Restless legs syndrome   UTI (urinary tract infection)   C. difficile colitis   Discharge Condition: Stable and improved.  Diet recommendation: Regular  Filed Weights   05/21/24 2300  Weight: 62.4 kg    History of present illness:  HPI per Dr. Waddell Elizabeth Mcintyre is a 83 y.o. female with medical history significant of asthma, HTN, hypothyroidism, dementia, B12 deficiency, RLS, peripheral neuropathy, GERD, gout, HLD, carotid artery stenosis who presented to ED with complaints of syncope and history of vomiting and diarrhea x 2 weeks. She tells me she was at home and apparently passed out and fell. She can't tell me if she hit her head. Family and patient unsure how long she was out for. Her granddaughter called 911. Her sister reports she has not been feeling well the past few weeks. She has had stomach upset, diarrhea and a few episodes of vomiting this morning. She denies any fever/chills. She denies any dysuria, urgency or frequency. She has lower abdominal pain she rates as a 5-6 and described as achy. Pain is constant. Nothing seems to make it better or worse. She has had poor PO intake. Sister states it's been quite awhile that  she has eaten well and she tells me she doesn't really like food anymore.    Discharged from [facility] on 05/02/24 for atypical chest pain, ACS ruled out, hyponatremia and acute on chronic kidney disease.      Denies any fever/chills, vision changes/headaches, chest pain or palpitations, shortness of breath or cough, dysuria or leg swelling.      She does not smoke or drink.    ER Course:  temp: 96, bp: 144/85, HR:: 63, RR: 28, oxygen: 100%RA Pertinent labs: lipase: 99, WBC: 10.9, creatinine: 1.35, calcium : 10.4, AST: 43, ALT: 56, lactic acid: 4.0, UA: many bacterial >10WBC, large LE  CXR: no acute findings CT chest/abdomen/pelvis: no acute abnormality. Diverticulosis. Aortic atherosclerosis.  In eD: code sepsis.  given 2L IVF, flagyl , cefepime  and vanc. BC/urine culture obtained. [facility] asked to admit.      Hospital Course:  #1 severe sepsis secondary to UTI and C. difficile colitis and norovirus - Patient noted to have met criteria on admission for severe sepsis with tachycardia, lactic acidosis of 4.8, hypothermia, source of infection noted to be UTI as well as C. difficile colitis. - Urine cultures grew 70,000 colonies of E. coli and 30,000 colonies of Streptococcus agalactiae. - Status post 3 days of IV Rocephin . - Patient maintained on oral vancomycin  for C. difficile colitis during the hospitalization with clinical improvement and will be discharged on 5 more days of oral vancomycin  to complete a course of treatment.  -Outpatient follow-up with PCP.    2.  Syncope -Felt likely related to hypovolemia in the setting of dehydration secondary to diarrhea and poor oral intake. - 2D echo done with normal EF of 70 to 75%,NWMA, grade 1 DD. - CT head done with generalized cerebral atrophy and microvascular disease changes of the supratentorial brain.  No acute intracranial abnormalities. - Patient received IV fluids. - 500 cc normal saline bolus x 1 on 05/25/2024. - Patient maintained on TED hose.    - Outpatient follow-up with PCP.    3.  C. difficile colitis/norovirus gastroenteritis -Patient noted to have a positive C. difficile antigen and PCR. - Consistency and frequency of stools improved during the hospitalization.   - Patient was placed on oral vancomycin  and be discharged on 5 more days of oral vancomycin  to complete a course of treatment.   - Outpatient follow-up with PCP.    4.  Hypokalemia -Secondary to GI losses. - Repleted.   5.  History of PUD/GERD -Patient noted to report belching and some chest tightness/chest pain. - Chest tightness felt likely related to dyspepsia. - Cardiac enzymes negative, 2D echo with no wall motion abnormalities. - Patient status post cholecystectomy 2018. - CT abdomen and pelvis done with no biliary duct dilatation. - Continue Carafate . - Due to C. difficile colitis, PPI discontinued and patient placed on Pepcid  twice daily.  - Patient also placed on GI cocktail as needed for heartburn and chest pain during the hospitalization.   - Patient will be discharged on Pepcid  with outpatient follow-up with PCP.    6.  Left shoulder pain - Patient placed on a lidocaine  patch.   7.  Hypertension - Controlled on current regimen of Norvasc  10 mg daily. -Patient's home regimen Coreg  was held during the hospitalization and will be resumed on discharge.   8.  Mild intermittent asthma -Nebs as needed.   9.  Mild dementia - Patient maintained on home regimen Aricept  and mirtazapine .   10.  Hypothyroidism - Patient maintained on home regimen Synthroid .   11.  Hyperlipidemia - Patient maintained on home regimen Crestor .    12.  CKD stage IIIa -Baseline creatinine approximately 1.2-1.5. -Creatinine remained stable during the hospitalization and was at baseline by day of discharge.     13.  RLS - Patient maintained on home regimen Requip .      Procedures: CT head CT C-spine 05/21/2024 CT chest abdomen and pelvis 05/21/2024 Plain films of the  left shoulder 05/21/2024 Plain films of the C-spine 05/21/2024 Chest x-ray 05/21/2024 2D echo 05/22/2024  Consultations: None  Discharge Exam: Vitals:   05/28/24 0535 05/28/24 1300  BP: 137/65 132/77  Pulse: 76 77  Resp: 12   Temp: 98 F (36.7 C) 98.4 F (36.9 C)  SpO2: 96% 97%    General: NAD Cardiovascular: RRR no murmurs rubs or gallops.  No JVD.  No lower extremity edema Respiratory: Clear to auscultation bilaterally.  No wheezes, no crackles, no rhonchi.  Fair air movement.  Speaking in full sentences.  Discharge Instructions   Discharge Instructions     Diet general   Complete by: As directed    Increase activity slowly   Complete by: As directed       Allergies as of 05/28/2024       Reactions   Codeine Nausea And Vomiting, Rash        Medication List     STOP taking these medications    pantoprazole  40 MG tablet Commonly known as: PROTONIX        TAKE  these medications    acetaminophen  325 MG tablet Commonly known as: TYLENOL  Take 325 mg by mouth every 6 (six) hours as needed for moderate pain.   albuterol  108 (90 Base) MCG/ACT inhaler Commonly known as: VENTOLIN  HFA Inhale 2 puffs into the lungs every 6 (six) hours as needed for wheezing or shortness of breath.   amLODipine  10 MG tablet Commonly known as: NORVASC  Take 1 tablet (10 mg total) by mouth daily.   carvedilol  3.125 MG tablet Commonly known as: COREG  Take 1 tablet (3.125 mg total) by mouth 2 (two) times daily with a meal.   cetirizine 10 MG tablet Commonly known as: ZYRTEC Take 10 mg by mouth at bedtime. As needed   cyanocobalamin  1000 MCG tablet Commonly known as: VITAMIN B12 Take 1,000 mcg by mouth daily.   donepezil  10 MG tablet Commonly known as: ARICEPT  Take 1 tablet (10 mg total) by mouth daily.   famotidine  40 MG tablet Commonly known as: PEPCID  Take 1 tablet (40 mg total) by mouth 2 (two) times daily for 30 days, THEN 1 tablet (40 mg total) daily. Start taking on:  May 28, 2024   levothyroxine  75 MCG tablet Commonly known as: SYNTHROID  Take 1 tablet (75 mcg total) by mouth daily.   lidocaine  5 % Commonly known as: LIDODERM  Place 1 patch onto the skin daily. Remove & Discard patch within 12 hours or as directed by MD   mirtazapine  15 MG tablet Commonly known as: REMERON  Take 1 tablet (15 mg total) by mouth at bedtime.   rOPINIRole  0.25 MG tablet Commonly known as: REQUIP  Take 0.25 mg by mouth at bedtime.   rosuvastatin  10 MG tablet Commonly known as: CRESTOR  Take 1 tablet (10 mg total) by mouth daily.   sucralfate  1 g tablet Commonly known as: CARAFATE  TAKE 1 TABLET BY MOUTH 4 TIMES DAILY.   vancomycin  125 MG capsule Commonly known as: VANCOCIN  Take 1 capsule (125 mg total) by mouth 4 (four) times daily for 5 days.   vitamin C  100 MG tablet Take 100 mg by mouth daily.   Vitamin D3 25 MCG (1000 UT) Caps Take 1,000 Units by mouth daily.       Allergies  Allergen Reactions   Codeine Nausea And Vomiting and Rash    Follow-up Information     Elizabeth Suzzane POUR, MD. Schedule an appointment as soon as possible for a visit in 2 week(s).   Specialty: Internal Medicine Contact information: [address] [place] [address] [phone]         Care, [facility] Follow up.   Specialty: Home Health Services Contact information: [address] [place] [address] [phone]                  The results of significant diagnostics from this hospitalization (including imaging, microbiology, ancillary and laboratory) are listed below for reference.    Significant Diagnostic Studies: ECHOCARDIOGRAM COMPLETE Result Date: 05/22/2024    ECHOCARDIOGRAM REPORT   Patient Name:   MALIYA MARICH Date of Exam: 05/22/2024 Medical Rec #:  [id number]       Height:       64.0 in Accession #:    [id number]      Weight:       137.6 lb Date of Birth:  06-02-1941       BSA:          1.669 m Patient Age:    25  years  BP:           157/90 mmHg Patient Gender: F               HR:           72 bpm. Exam Location:  Inpatient Procedure: 2D Echo, Cardiac Doppler and Color Doppler (Both Spectral and Color            Flow Doppler were utilized during procedure). Indications:    Syncope  History:        Patient has no prior history of Echocardiogram examinations.                 Signs/Symptoms:Syncope; Risk Factors:Dyslipidemia and                 Hypertension.  Sonographer:    BERNARDA ROCKS Referring Phys: [id number] ALLISON WOLFE IMPRESSIONS  1. Left ventricular ejection fraction, by estimation, is 70 to 75%. The left ventricle has hyperdynamic function. Small intracavitary gradient of 14 mm Hg at rest and 25 mm Hg at Valsalva. The left ventricle has no regional wall motion abnormalities. Left ventricular diastolic parameters are consistent with Grade I diastolic dysfunction (impaired relaxation). Elevated left ventricular end-diastolic pressure.  2. Right ventricular systolic function is normal. The right ventricular size is normal. Tricuspid regurgitation signal is inadequate for assessing PA pressure.  3. The mitral valve is normal in structure. No evidence of mitral valve regurgitation. No evidence of mitral stenosis.  4. The aortic valve has an indeterminant number of cusps. There is moderate calcification of the aortic valve. Aortic valve regurgitation is not visualized. No aortic stenosis is present. Aortic valve area, by VTI measures 1.98 cm. Aortic valve mean gradient measures 4.0 mmHg. Aortic valve Vmax measures 1.36 m/s.  5. The inferior vena cava is normal in size with greater than 50% respiratory variability, suggesting right atrial pressure of 3 mmHg. Comparison(s): No prior Echocardiogram. FINDINGS  Left Ventricle: Left ventricular ejection fraction, by estimation, is 70 to 75%. The left ventricle has hyperdynamic function. The left ventricle has no regional wall motion abnormalities. Strain was performed and  the global longitudinal strain is indeterminate. The left ventricular internal cavity size was normal in size. There is no left ventricular hypertrophy. Left ventricular diastolic parameters are consistent with Grade I diastolic dysfunction (impaired relaxation). Elevated left ventricular end-diastolic pressure. Right Ventricle: The right ventricular size is normal. No increase in right ventricular wall thickness. Right ventricular systolic function is normal. Tricuspid regurgitation signal is inadequate for assessing PA pressure. Left Atrium: Left atrial size was normal in size. Right Atrium: Right atrial size was normal in size. Pericardium: There is no evidence of pericardial effusion. Mitral Valve: The mitral valve is normal in structure. No evidence of mitral valve regurgitation. No evidence of mitral valve stenosis. MV peak gradient, 7.5 mmHg. The mean mitral valve gradient is 4.0 mmHg. Tricuspid Valve: The tricuspid valve is grossly normal. Tricuspid valve regurgitation is not demonstrated. No evidence of tricuspid stenosis. Aortic Valve: The aortic valve has an indeterminant number of cusps. There is moderate calcification of the aortic valve. Aortic valve regurgitation is not visualized. No aortic stenosis is present. Aortic valve mean gradient measures 4.0 mmHg. Aortic valve peak gradient measures 7.4 mmHg. Aortic valve area, by VTI measures 1.98 cm. Pulmonic Valve: The pulmonic valve was normal in structure. Pulmonic valve regurgitation is not visualized. No evidence of pulmonic stenosis. Aorta: The aortic root and ascending aorta are structurally normal, with no evidence of dilitation. Venous:  The inferior vena cava is normal in size with greater than 50% respiratory variability, suggesting right atrial pressure of 3 mmHg. IAS/Shunts: No atrial level shunt detected by color flow Doppler. Additional Comments: 3D was performed not requiring image post processing on an independent workstation and was  indeterminate.  LEFT VENTRICLE PLAX 2D LVIDd:         3.00 cm     Diastology LVIDs:         1.80 cm     LV e' medial:    6.20 cm/s LV PW:         1.00 cm     LV E/e' medial:  16.0 LV IVS:        1.10 cm     LV e' lateral:   7.62 cm/s LVOT diam:     1.70 cm     LV E/e' lateral: 13.0 LV SV:         56 LV SV Index:   34 LVOT Area:     2.27 cm  LV Volumes (MOD) LV vol d, MOD A2C: 61.2 ml LV vol d, MOD A4C: 75.6 ml LV vol s, MOD A2C: 13.8 ml LV vol s, MOD A4C: 26.4 ml LV SV MOD A2C:     47.4 ml LV SV MOD A4C:     75.6 ml LV SV MOD BP:      49.1 ml RIGHT VENTRICLE             IVC RV Basal diam:  2.80 cm     IVC diam: 1.90 cm RV S prime:     15.20 cm/s TAPSE (M-mode): 1.8 cm LEFT ATRIUM           Index        RIGHT ATRIUM          Index LA diam:      3.40 cm 2.04 cm/m   RA Area:     9.73 cm LA Vol (A2C): 34.7 ml 20.79 ml/m  RA Volume:   17.70 ml 10.61 ml/m LA Vol (A4C): 22.9 ml 13.72 ml/m  AORTIC VALVE                    PULMONIC VALVE AV Area (Vmax):    2.00 cm     PV Vmax:          1.14 m/s AV Area (Vmean):   1.83 cm     PV Peak grad:     5.2 mmHg AV Area (VTI):     1.98 cm     PR End Diast Vel: 0.90 msec AV Vmax:           136.00 cm/s AV Vmean:          92.300 cm/s AV VTI:            0.284 m AV Peak Grad:      7.4 mmHg AV Mean Grad:      4.0 mmHg LVOT Vmax:         120.00 cm/s LVOT Vmean:        74.600 cm/s LVOT VTI:          0.248 m LVOT/AV VTI ratio: 0.87  AORTA Ao Root diam: 2.50 cm Ao Asc diam:  2.90 cm MITRAL VALVE MV Area (PHT): 2.82 cm     SHUNTS MV Area VTI:   1.80 cm     Systemic VTI:  0.25 m MV Peak grad:  7.5 mmHg     Systemic  Diam: 1.70 cm MV Mean grad:  4.0 mmHg MV Vmax:       1.37 m/s MV Vmean:      87.1 cm/s MV Decel Time: 269 msec MV E velocity: 99.10 cm/s MV A velocity: 145.00 cm/s MV E/A ratio:  0.68 Vishnu Priya Mallipeddi Electronically signed by Diannah Late Mallipeddi Signature Date/Time: 05/22/2024/3:02:39 PM    Final    CT CERVICAL SPINE WO CONTRAST Result Date: 05/21/2024 CLINICAL  DATA:  Syncope. EXAM: CT CERVICAL SPINE WITHOUT CONTRAST TECHNIQUE: Multidetector CT imaging of the cervical spine was performed without intravenous contrast. Multiplanar CT image reconstructions were also generated. RADIATION DOSE REDUCTION: This exam was performed according to the departmental dose-optimization program which includes automated exposure control, adjustment of the mA and/or kV according to patient size and/or use of iterative reconstruction technique. COMPARISON:  None Available. FINDINGS: Alignment: Normal. Skull base and vertebrae: No acute fracture. No primary bone lesion or focal pathologic process. Soft tissues and spinal canal: No prevertebral fluid or swelling. No visible canal hematoma. Disc levels: Moderate severity endplate sclerosis is seen at the level of C5-C6. Very mild endplate sclerosis is present throughout the remainder of the cervical spine. Moderate to marked severity intervertebral disc space narrowing is also noted at the level of C5-C6 with mild multilevel intervertebral disc space narrowing throughout the remainder of the cervical spine. Mild, bilateral multilevel facet joint hypertrophy is noted. Upper chest: Negative. Other: None. IMPRESSION: 1. No acute fracture or subluxation in the cervical spine. 2. 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 Electronically Signed   By: Suzen Dials M.D.   On: 05/21/2024 18:30   CT HEAD WO CONTRAST ( ) Result Date: 05/21/2024 CLINICAL DATA:  Syncopal episode. EXAM: CT HEAD WITHOUT CONTRAST TECHNIQUE: Contiguous axial images were obtained from the base of the skull through the vertex without intravenous contrast. RADIATION DOSE REDUCTION: This exam was performed according to the departmental dose-optimization program which includes automated exposure control, adjustment of the mA and/or kV according to patient size and/or use of iterative reconstruction technique. COMPARISON:  Jan 30, 2021  FINDINGS: Brain: There is generalized cerebral atrophy with widening of the extra-axial spaces and ventricular dilatation. There are areas of decreased attenuation within the white matter tracts of the supratentorial brain, consistent with microvascular disease changes. Vascular: Marked severity bilateral cavernous carotid artery calcification is noted. Skull: Normal. Negative for fracture or focal lesion. Sinuses/Orbits: No acute finding. Other: None. IMPRESSION: 1. Generalized cerebral atrophy and microvascular disease changes of the supratentorial brain. 2. No acute intracranial abnormality. Electronically Signed   By: Suzen Dials M.D.   On: 05/21/2024 18:27   DG Cervical Spine 2 or 3 views Result Date: 05/21/2024 CLINICAL DATA:  Neck pain after fall. EXAM: CERVICAL SPINE - 2-3 VIEW COMPARISON:  None Available. FINDINGS: Only the first 5 cervical vertebra are adequately visualized. No fracture or spondylolisthesis is seen involving the visualized vertebra. Pathology involving C6 and C7 vertebra cannot be excluded on the basis of this exam. IMPRESSION: Only the first 5 cervical vertebra are adequately visualized. No fracture or spondylolisthesis is seen involving the visualized vertebra. Pathology involving C6 and C7 vertebra cannot be excluded on the basis of this exam. CT scan is recommended for further evaluation. Electronically Signed   By: Lynwood Landy Raddle M.D.   On: 05/21/2024 17:17   DG Shoulder Left Result Date: 05/21/2024 CLINICAL DATA:  Left shoulder pain after fall. EXAM: LEFT SHOULDER - 2+ VIEW COMPARISON:  None  Available. FINDINGS: There is no evidence of fracture or dislocation. There is no evidence of arthropathy or other focal bone abnormality. Soft tissues are unremarkable. IMPRESSION: Negative. Electronically Signed   By: Lynwood Landy Raddle M.D.   On: 05/21/2024 17:15   CT CHEST ABDOMEN PELVIS W CONTRAST Result Date: 05/21/2024 CLINICAL DATA:  Witnessed syncopal episode. Two-week of  vomiting and diarrhea EXAM: CT CHEST, ABDOMEN, AND PELVIS WITH CONTRAST TECHNIQUE: Multidetector CT imaging of the chest, abdomen and pelvis was performed following the standard protocol during bolus administration of intravenous contrast. RADIATION DOSE REDUCTION: This exam was performed according to the departmental dose-optimization program which includes automated exposure control, adjustment of the mA and/or kV according to patient size and/or use of iterative reconstruction technique. CONTRAST:  80mL OMNIPAQUE  IOHEXOL  300 MG/ML  SOLN COMPARISON:  CT chest, abdomen, and pelvis dated 05/17/2023 FINDINGS: CT CHEST FINDINGS Cardiovascular: Normal heart size. No significant pericardial fluid/thickening. Great vessels are normal in course and caliber. No central pulmonary emboli. Coronary artery calcifications. Mediastinum/Nodes: Imaged thyroid  gland without nodules meeting criteria for imaging follow-up by size. Normal esophagus. No pathologically enlarged axillary, supraclavicular, mediastinal, or hilar lymph nodes. Lungs/Pleura: The central airways are patent. No focal consolidation. No pneumothorax. No pleural effusion. Musculoskeletal: No acute or abnormal lytic or blastic osseous lesions. Multilevel degenerative changes of the thoracic spine. CT ABDOMEN PELVIS FINDINGS Hepatobiliary: No focal hepatic lesions. No intra or extrahepatic biliary ductal dilation. Cholecystectomy. Pancreas: No focal lesions or main ductal dilation. Spleen: Normal in size without focal abnormality. Adrenals/Urinary Tract: No adrenal nodules. No suspicious renal mass, calculi, or hydronephrosis. Unchanged exophytic right interpolar 1.5 cm simple cyst (2:61). No specific follow-up imaging recommended. No focal bladder wall thickening. Stomach/Bowel: Normal appearance of the stomach. No evidence of bowel wall thickening, distention, or inflammatory changes. Colonic diverticulosis without acute diverticulitis. Normal appendix.  Vascular/Lymphatic: Aortic atherosclerosis. No enlarged abdominal or pelvic lymph nodes. Reproductive: No adnexal masses. Other: No free fluid, fluid collection, or free air. Musculoskeletal: No acute or abnormal lytic or blastic osseous findings. Multilevel degenerative changes of the lumbar spine. IMPRESSION: 1. No acute abnormality in the chest, abdomen, or pelvis. 2. Colonic diverticulosis without acute diverticulitis. 3. Aortic Atherosclerosis (ICD10-I70.0). Coronary artery calcifications. Assessment for potential risk factor modification, dietary therapy or pharmacologic therapy may be warranted, if clinically indicated. Electronically Signed   By: Limin  Xu M.D.   On: 05/21/2024 14:34   DG Chest Port 1 View Result Date: 05/21/2024 CLINICAL DATA:  Syncope. EXAM: PORTABLE CHEST 1 VIEW COMPARISON:  05/01/2024. FINDINGS: Trachea is midline. Heart is mildly enlarged, stable. Thoracic aorta is calcified. Lungs are low in volume with minimal bibasilar atelectasis. No pleural fluid. IMPRESSION: Low lung volumes.  No acute findings. Electronically Signed   By: Newell Eke M.D.   On: 05/21/2024 14:03   NM Myocar Multi W/Spect W/Wall Motion / EF Result Date: 05/02/2024   No ST deviation was noted. Pharmacological protocol is used.   LV perfusion is normal. There is no evidence of ischemia. There is no evidence of infarction.   Left ventricular function is normal. Nuclear stress EF: 97%.   Findings are consistent with no ischemia and no infarction. The study is low risk.   DG Chest 2 View Result Date: 05/01/2024 CLINICAL DATA:  Central chest tightness for 2 days, back and neck pain EXAM: CHEST - 2 VIEW COMPARISON:  05/17/2023 FINDINGS: Frontal and lateral views of the chest demonstrate a stable cardiac silhouette. Atherosclerosis of the thoracic aorta again noted.  No airspace disease, effusion, or pneumothorax. No acute bony abnormalities. IMPRESSION: 1. Stable chest, no acute process. Electronically Signed    By: Ozell Daring M.D.   On: 05/01/2024 17:14    Microbiology: Recent Results (from the past 240 hours)  Urine Culture     Status: Abnormal   Collection Time: 05/21/24 12:41 PM   Specimen: Urine, Random  Result Value Ref Range Status   Specimen Description   Final    URINE, RANDOM Performed at [facility], [address] [place], [place] [address]    Special Requests   Final    NONE Reflexed from [id number] Performed at [facility], [address], [place], [place] [address]    Culture (A)  Final    70,000 COLONIES/mL ESCHERICHIA COLI 30,000 COLONIES/mL STREPTOCOCCUS AGALACTIAE TESTING AGAINST S. AGALACTIAE NOT ROUTINELY PERFORMED DUE TO PREDICTABILITY OF AMP/PEN/VAN SUSCEPTIBILITY. Performed at [facility] [facility], [address] [place], [place] [address]    Report Status 05/23/2024 FINAL  Final   Organism ID, Bacteria ESCHERICHIA COLI (A)  Final      Susceptibility   Escherichia coli - MIC*    AMPICILLIN <=2 SENSITIVE Sensitive     CEFAZOLIN  (URINE) Value in next row Sensitive      <=1 SENSITIVEThis is a modified FDA-approved test that has been validated and its performance characteristics determined by the reporting laboratory.  This laboratory is certified under the Clinical Laboratory Improvement Amendments CLIA as qualified to perform high complexity clinical laboratory testing.    CEFEPIME  Value in next row Sensitive      <=1 SENSITIVEThis is a modified FDA-approved test that has been validated and its performance characteristics determined by the reporting laboratory.  This laboratory is certified under the Clinical Laboratory Improvement Amendments CLIA as qualified to perform high complexity clinical laboratory testing.    ERTAPENEM Value in next row Sensitive      <=1 SENSITIVEThis is a modified FDA-approved test that has been validated and its performance characteristics determined by the reporting laboratory.  This laboratory is  certified under the Clinical Laboratory Improvement Amendments CLIA as qualified to perform high complexity clinical laboratory testing.    CEFTRIAXONE  Value in next row Sensitive      <=1 SENSITIVEThis is a modified FDA-approved test that has been validated and its performance characteristics determined by the reporting laboratory.  This laboratory is certified under the Clinical Laboratory Improvement Amendments CLIA as qualified to perform high complexity clinical laboratory testing.    CIPROFLOXACIN  Value in next row Sensitive      <=1 SENSITIVEThis is a modified FDA-approved test that has been validated and its performance characteristics determined by the reporting laboratory.  This laboratory is certified under the Clinical Laboratory Improvement Amendments CLIA as qualified to perform high complexity clinical laboratory testing.    GENTAMICIN Value in next row Sensitive      <=1 SENSITIVEThis is a modified FDA-approved test that has been validated and its performance characteristics determined by the reporting laboratory.  This laboratory is certified under the Clinical Laboratory Improvement Amendments CLIA as qualified to perform high complexity clinical laboratory testing.    NITROFURANTOIN Value in next row Sensitive      <=1 SENSITIVEThis is a modified FDA-approved test that has been validated and its performance characteristics determined by the reporting laboratory.  This laboratory is certified under the Clinical Laboratory Improvement Amendments CLIA as qualified to perform high complexity clinical laboratory testing.    TRIMETH /SULFA  Value in next  row Sensitive      <=1 SENSITIVEThis is a modified FDA-approved test that has been validated and its performance characteristics determined by the reporting laboratory.  This laboratory is certified under the Clinical Laboratory Improvement Amendments CLIA as qualified to perform high complexity clinical laboratory testing.     AMPICILLIN/SULBACTAM Value in next row Sensitive      <=1 SENSITIVEThis is a modified FDA-approved test that has been validated and its performance characteristics determined by the reporting laboratory.  This laboratory is certified under the Clinical Laboratory Improvement Amendments CLIA as qualified to perform high complexity clinical laboratory testing.    PIP/TAZO Value in next row Sensitive ug/mL     <=4 SENSITIVEThis is a modified FDA-approved test that has been validated and its performance characteristics determined by the reporting laboratory.  This laboratory is certified under the Clinical Laboratory Improvement Amendments CLIA as qualified to perform high complexity clinical laboratory testing.    MEROPENEM Value in next row Sensitive      <=4 SENSITIVEThis is a modified FDA-approved test that has been validated and its performance characteristics determined by the reporting laboratory.  This laboratory is certified under the Clinical Laboratory Improvement Amendments CLIA as qualified to perform high complexity clinical laboratory testing.    * 70,000 COLONIES/mL ESCHERICHIA COLI  Blood Culture (routine x 2)     Status: None   Collection Time: 05/21/24 12:49 PM   Specimen: BLOOD  Result Value Ref Range Status   Specimen Description   Final    BLOOD RIGHT ANTECUBITAL Performed at [facility], [address], [place], [place] [address]    Special Requests   Final    BOTTLES DRAWN AEROBIC AND ANAEROBIC Blood Culture adequate volume Performed at [facility], [address] [place], [place] [address]    Culture   Final    NO GROWTH 5 DAYS Performed at [facility], [address] [place], [place] [address]    Report Status 05/26/2024 FINAL  Final  Blood Culture (routine x 2)     Status: None   Collection Time: 05/21/24  1:25 PM   Specimen: BLOOD  Result Value Ref Range Status   Specimen Description   Final    BLOOD LEFT  ANTECUBITAL Performed at [facility], [address], [place], [place] [address]    Special Requests   Final    BOTTLES DRAWN AEROBIC ONLY Blood Culture results may not be optimal due to an inadequate volume of blood received in culture bottles Performed at [facility], [address], [place], [place] [address]    Culture   Final    NO GROWTH 5 DAYS Performed at [facility], [address] [place], [place] [address]    Report Status 05/26/2024 FINAL  Final  Resp panel by RT-PCR (RSV, Flu A&B, Covid) Anterior Nasal Swab     Status: None   Collection Time: 05/21/24  1:34 PM   Specimen: Anterior Nasal Swab  Result Value Ref Range Status   SARS Coronavirus 2 by RT PCR NEGATIVE NEGATIVE Final    Comment: (NOTE) SARS-CoV-2 target nucleic acids are NOT DETECTED.  The SARS-CoV-2 RNA is generally detectable in upper respiratory specimens during the acute phase of infection. The lowest concentration of SARS-CoV-2 viral copies this assay can detect is 138 copies/mL. A negative result does not preclude SARS-Cov-2 infection and should not be used as the sole basis for treatment or other patient management  decisions. A negative result may occur with  improper specimen collection/handling, submission of specimen other than nasopharyngeal swab, presence of viral mutation(s) within the areas targeted by this assay, and inadequate number of viral copies(<138 copies/mL). A negative result must be combined with clinical observations, patient history, and epidemiological information. The expected result is Negative.  Fact Sheet for Patients:  BloggerCourse.com  Fact Sheet for Healthcare Providers:  SeriousBroker.it  This test is no t yet approved or cleared by the [place]  FDA and  has been authorized for detection and/or diagnosis of SARS-CoV-2 by FDA under an Emergency Use Authorization (EUA).  This EUA will remain  in effect (meaning this test can be used) for the duration of the COVID-19 declaration under Section 564(b)(1) of the Act, 21 U.S.C.section 360bbb-3(b)(1), unless the authorization is terminated  or revoked sooner.       Influenza A by PCR NEGATIVE NEGATIVE Final   Influenza B by PCR NEGATIVE NEGATIVE Final    Comment: (NOTE) The Xpert Xpress SARS-CoV-2/FLU/RSV plus assay is intended as an aid in the diagnosis of influenza from Nasopharyngeal swab specimens and should not be used as a sole basis for treatment. Nasal washings and aspirates are unacceptable for Xpert Xpress SARS-CoV-2/FLU/RSV testing.  Fact Sheet for Patients: BloggerCourse.com  Fact Sheet for Healthcare Providers: SeriousBroker.it  This test is not yet approved or cleared by the [place]  FDA and has been authorized for detection and/or diagnosis of SARS-CoV-2 by FDA under an Emergency Use Authorization (EUA). This EUA will remain in effect (meaning this test can be used) for the duration of the COVID-19 declaration under Section 564(b)(1) of the Act, 21 U.S.C. section 360bbb-3(b)(1), unless the authorization is terminated or revoked.     Resp Syncytial Virus by PCR NEGATIVE NEGATIVE Final    Comment: (NOTE) Fact Sheet for Patients: BloggerCourse.com  Fact Sheet for Healthcare Providers: SeriousBroker.it  This test is not yet approved or cleared by the [place]  FDA and has been authorized for detection and/or diagnosis of SARS-CoV-2 by FDA under an Emergency Use Authorization (EUA). This EUA will remain in effect (meaning this test can be used) for the duration of the COVID-19 declaration under Section 564(b)(1) of the Act, 21 U.S.C. section 360bbb-3(b)(1), unless the authorization is terminated or revoked.  Performed at [facility], [address] [place], [place] [address]   Gastrointestinal Panel by PCR , Stool     Status: Abnormal   Collection Time: 05/22/24  8:36 AM   Specimen: Stool  Result Value Ref Range Status   Campylobacter species NOT DETECTED NOT DETECTED Final   Plesimonas shigelloides NOT DETECTED NOT DETECTED Final   Salmonella species NOT DETECTED NOT DETECTED Final   Yersinia enterocolitica NOT DETECTED NOT DETECTED Final   Vibrio species NOT DETECTED NOT DETECTED Final   Vibrio cholerae NOT DETECTED NOT DETECTED Final   Enteroaggregative E coli (EAEC) NOT DETECTED NOT DETECTED Final   Enteropathogenic E coli (EPEC) NOT DETECTED NOT DETECTED Final   Enterotoxigenic E coli (ETEC) NOT DETECTED NOT DETECTED Final   Shiga like toxin producing E coli (STEC) NOT DETECTED NOT DETECTED Final   Shigella/Enteroinvasive E coli (EIEC) NOT DETECTED NOT DETECTED Final   Cryptosporidium NOT DETECTED NOT DETECTED Final   Cyclospora cayetanensis NOT DETECTED NOT DETECTED Final   Entamoeba histolytica NOT DETECTED NOT DETECTED Final   Giardia lamblia NOT DETECTED NOT DETECTED Final   Adenovirus F40/41 NOT DETECTED NOT DETECTED Final   Astrovirus NOT DETECTED NOT DETECTED  Final   Norovirus GI/GII DETECTED (A) NOT DETECTED Final    Comment: CRITICAL RESULT CALLED TO, READ BACK BY AND VERIFIED WITH: HEATHER BULLINS 05/24/24 1205 TDL    Rotavirus A NOT DETECTED NOT DETECTED Final   Sapovirus (I, II, IV, and V) NOT DETECTED NOT DETECTED Final    Comment: Performed at [facility], [address], [place], [place] [address]  C Difficile Quick Screen w PCR reflex     Status: Abnormal   Collection Time: 05/22/24  8:36 AM   Specimen: Stool  Result Value Ref Range Status   C Diff antigen POSITIVE (A) NEGATIVE Final   C Diff toxin NEGATIVE NEGATIVE Final   C Diff interpretation Results are indeterminate. See PCR results.  Final    Comment: Performed at [facility], [address], [place], [place]  [address]  C. Diff by PCR, Reflexed     Status: Abnormal   Collection Time: 05/22/24  8:36 AM  Result Value Ref Range Status   Toxigenic C. Difficile by PCR POSITIVE (A) NEGATIVE Final    Comment: Positive for toxigenic C. difficile with little to no toxin production. Only treat if clinical presentation suggests symptomatic illness.   Hypervirulent Strain PRESUMPTIVE NEGATIVE PRESUMPTIVE NEGATIVE Final    Comment: Performed at [facility], [address] [place], [place] [address]     Labs: Basic Metabolic Panel: Recent Labs  Lab 05/23/24 0654 05/24/24 0412 05/25/24 0413 05/26/24 0143 05/27/24 0352 05/28/24 0459  NA 141 143 140 141 141 140  K 2.7* 3.3* 3.4* 3.5 3.5 4.4  CL 103 106 104 106 106 107  CO2 21* 21* 22 21* 21* 21*  GLUCOSE 180* 105* 126* 142* 127* 123*  BUN 11 9 10 9 9 13   CREATININE 1.25* 1.15* 1.30* 1.21* 1.18* 1.30*  CALCIUM  8.9 9.2 9.3 9.2 9.5 9.9  MG 1.9 2.0  --  1.9 1.9 2.2   Liver Function Tests: Recent Labs  Lab 05/22/24 0306  AST 40  ALT 46*  ALKPHOS 65  BILITOT 0.6  PROT 6.4*  ALBUMIN 4.2   Recent Labs  Lab 05/22/24 0306  LIPASE 37   No results for input(s): AMMONIA in the last 168 hours. CBC: Recent Labs  Lab 05/23/24 0407 05/24/24 0412 05/25/24 0413 05/26/24 0143 05/27/24 0352  WBC 7.7 9.5 8.9 9.8 10.7*  NEUTROABS 3.3 4.3 4.4 4.4 5.9  HGB 14.0 13.5 13.5 13.3 14.1  HCT 40.3 39.9 40.3 39.9 40.6  MCV 85.2 89.3 87.6 87.3 87.1  PLT 149* 234 229 229 223   Cardiac Enzymes: No results for input(s): CKTOTAL, CKMB, CKMBINDEX, TROPONINI in the last 168 hours. BNP: BNP (last 3 results) No results for input(s): BNP in the last 8760 hours.  ProBNP (last 3 results) No results for input(s): PROBNP in the last 8760 hours.  CBG: Recent Labs  Lab 05/26/24 1959  GLUCAP 129*       Signed:  Toribio Hummer MD.  [facility] 05/28/2024, 4:19 PM

## 2024-05-28 NOTE — Plan of Care (Signed)
 Problem: Education: Goal: Knowledge of General Education information will improve Description: Including pain rating scale, medication(s)/side effects and non-pharmacologic comfort measures 05/28/2024 1651 by Ivery Chiquita CROME, RN Outcome: Completed/Met 05/28/2024 1607 by Ivery Chiquita CROME, RN Outcome: Progressing   Problem: Health Behavior/Discharge Planning: Goal: Ability to manage health-related needs will improve 05/28/2024 1651 by Ivery Chiquita CROME, RN Outcome: Completed/Met 05/28/2024 1607 by Ivery Chiquita CROME, RN Outcome: Progressing   Problem: Clinical Measurements: Goal: Ability to maintain clinical measurements within normal limits will improve 05/28/2024 1651 by Ivery Chiquita CROME, RN Outcome: Completed/Met 05/28/2024 1607 by Ivery Chiquita CROME, RN Outcome: Progressing Goal: Will remain free from infection 05/28/2024 1651 by Ivery Chiquita CROME, RN Outcome: Completed/Met 05/28/2024 1607 by Ivery Chiquita CROME, RN Outcome: Progressing Goal: Diagnostic test results will improve 05/28/2024 1651 by Ivery Chiquita CROME, RN Outcome: Completed/Met 05/28/2024 1607 by Ivery Chiquita CROME, RN Outcome: Progressing   Problem: Activity: Goal: Risk for activity intolerance will decrease 05/28/2024 1651 by Ivery Chiquita CROME, RN Outcome: Completed/Met 05/28/2024 1607 by Ivery Chiquita CROME, RN Outcome: Progressing   Problem: Nutrition: Goal: Adequate nutrition will be maintained 05/28/2024 1651 by Ivery Chiquita CROME, RN Outcome: Completed/Met 05/28/2024 1607 by Ivery Chiquita CROME, RN Outcome: Progressing   Problem: Coping: Goal: Level of anxiety will decrease 05/28/2024 1651 by Ivery Chiquita CROME, RN Outcome: Completed/Met 05/28/2024 1607 by Ivery Chiquita CROME, RN Outcome: Progressing   Problem: Elimination: Goal: Will not experience complications related to bowel motility 05/28/2024 1651 by Ivery Chiquita CROME, RN Outcome: Completed/Met 05/28/2024 1607 by Ivery Chiquita CROME, RN Outcome: Progressing Goal: Will not experience  complications related to urinary retention 05/28/2024 1651 by Ivery Chiquita CROME, RN Outcome: Completed/Met 05/28/2024 1607 by Ivery Chiquita CROME, RN Outcome: Progressing   Problem: Pain Managment: Goal: General experience of comfort will improve and/or be controlled 05/28/2024 1651 by Ivery Chiquita CROME, RN Outcome: Completed/Met 05/28/2024 1607 by Ivery Chiquita CROME, RN Outcome: Progressing   Problem: Safety: Goal: Ability to remain free from injury will improve 05/28/2024 1651 by Ivery Chiquita CROME, RN Outcome: Completed/Met 05/28/2024 1607 by Ivery Chiquita CROME, RN Outcome: Progressing   Problem: Skin Integrity: Goal: Risk for impaired skin integrity will decrease 05/28/2024 1651 by Ivery Chiquita CROME, RN Outcome: Completed/Met 05/28/2024 1607 by Ivery Chiquita CROME, RN Outcome: Progressing   Problem: Fluid Volume: Goal: Hemodynamic stability will improve 05/28/2024 1651 by Ivery Chiquita CROME, RN Outcome: Completed/Met 05/28/2024 1607 by Ivery Chiquita CROME, RN Outcome: Progressing   Problem: Clinical Measurements: Goal: Diagnostic test results will improve 05/28/2024 1651 by Ivery Chiquita CROME, RN Outcome: Completed/Met 05/28/2024 1607 by Ivery Chiquita CROME, RN Outcome: Progressing Goal: Signs and symptoms of infection will decrease 05/28/2024 1651 by Ivery Chiquita CROME, RN Outcome: Completed/Met 05/28/2024 1607 by Ivery Chiquita CROME, RN Outcome: Progressing   Problem: Respiratory: Goal: Ability to maintain adequate ventilation will improve 05/28/2024 1651 by Ivery Chiquita CROME, RN Outcome: Completed/Met 05/28/2024 1607 by Ivery Chiquita CROME, RN Outcome: Progressing   Problem: Education: Goal: Knowledge of condition and prescribed therapy will improve 05/28/2024 1651 by Ivery Chiquita CROME, RN Outcome: Completed/Met 05/28/2024 1607 by Ivery Chiquita CROME, RN Outcome: Progressing   Problem: Cardiac: Goal: Will achieve and/or maintain adequate cardiac output 05/28/2024 1651 by Ivery Chiquita CROME, RN Outcome:  Completed/Met 05/28/2024 1607 by Ivery Chiquita CROME, RN Outcome: Progressing   Problem: Physical Regulation: Goal: Complications related to the disease process, condition or treatment will be avoided or minimized 05/28/2024 1651 by Ivery Chiquita CROME, RN Outcome: Completed/Met 05/28/2024 1607 by Ivery,  Chiquita CROME, RN Outcome: Progressing

## 2024-05-28 NOTE — TOC Progression Note (Signed)
 Transition of Care [facility]) - Progression Note    Patient Details  Name: Elizabeth Mcintyre MRN: [id number] Date of Birth: 02/26/1941  Transition [facility]) CM/SW Contact  Sonda Manuella Quill, RN Phone Number: 05/28/2024, 3:46 PM  Clinical Narrative:    Orders received for HHPT/OT; spoke w/ pt's dtr Sari Masters [phone]); she agreed to recc services; her agency of choice is [facility]; she also gave d/c address: [address], [place] [address]; referral given to [facility] at agency; he said services can be provided; pt's dtr notified; agency contact info placed in follow up provider section of d/c instructions.  Expected Discharge Plan: Skilled Nursing Facility Barriers to Discharge: Continued Medical Work up               Expected Discharge Plan and Services                                               Social Drivers of Health (SDOH) Interventions SDOH Screenings   Food Insecurity: No Food Insecurity (05/21/2024)  Housing: Low Risk  (05/21/2024)  Transportation Needs: No Transportation Needs (05/21/2024)  Utilities: Not At Risk (05/21/2024)  Alcohol Screen: Low Risk  (06/04/2021)  Depression (PHQ2-9): Low Risk  (08/03/2023)  Financial Resource Strain: Low Risk  (06/04/2021)  Physical Activity: Insufficiently Active (05/27/2023)  Social Connections: Moderately Integrated (05/21/2024)  Stress: Stress Concern Present (05/27/2023)  Tobacco Use: Low Risk  (05/23/2024)    Readmission Risk Interventions    05/19/2023    2:38 PM  Readmission Risk Prevention Plan  Post Dischage Appt Complete  Medication Screening Complete  Transportation Screening Complete

## 2024-05-30 ENCOUNTER — Telehealth: Payer: Self-pay

## 2024-05-30 ENCOUNTER — Other Ambulatory Visit (HOSPITAL_COMMUNITY): Payer: Self-pay

## 2024-05-30 ENCOUNTER — Telehealth (HOSPITAL_COMMUNITY): Payer: Self-pay | Admitting: Pharmacy Technician

## 2024-05-30 DIAGNOSIS — B9620 Unspecified Escherichia coli [E. coli] as the cause of diseases classified elsewhere: Secondary | ICD-10-CM | POA: Diagnosis not present

## 2024-05-30 DIAGNOSIS — F329 Major depressive disorder, single episode, unspecified: Secondary | ICD-10-CM | POA: Diagnosis not present

## 2024-05-30 DIAGNOSIS — A419 Sepsis, unspecified organism: Secondary | ICD-10-CM | POA: Diagnosis not present

## 2024-05-30 DIAGNOSIS — K760 Fatty (change of) liver, not elsewhere classified: Secondary | ICD-10-CM | POA: Diagnosis not present

## 2024-05-30 DIAGNOSIS — E1142 Type 2 diabetes mellitus with diabetic polyneuropathy: Secondary | ICD-10-CM | POA: Diagnosis not present

## 2024-05-30 DIAGNOSIS — K222 Esophageal obstruction: Secondary | ICD-10-CM | POA: Diagnosis not present

## 2024-05-30 DIAGNOSIS — E1122 Type 2 diabetes mellitus with diabetic chronic kidney disease: Secondary | ICD-10-CM | POA: Diagnosis not present

## 2024-05-30 DIAGNOSIS — R6520 Severe sepsis without septic shock: Secondary | ICD-10-CM | POA: Diagnosis not present

## 2024-05-30 DIAGNOSIS — B951 Streptococcus, group B, as the cause of diseases classified elsewhere: Secondary | ICD-10-CM | POA: Diagnosis not present

## 2024-05-30 DIAGNOSIS — I129 Hypertensive chronic kidney disease with stage 1 through stage 4 chronic kidney disease, or unspecified chronic kidney disease: Secondary | ICD-10-CM | POA: Diagnosis not present

## 2024-05-30 DIAGNOSIS — A0472 Enterocolitis due to Clostridium difficile, not specified as recurrent: Secondary | ICD-10-CM | POA: Diagnosis not present

## 2024-05-30 DIAGNOSIS — G319 Degenerative disease of nervous system, unspecified: Secondary | ICD-10-CM | POA: Diagnosis not present

## 2024-05-30 DIAGNOSIS — I700 Atherosclerosis of aorta: Secondary | ICD-10-CM | POA: Diagnosis not present

## 2024-05-30 DIAGNOSIS — F02A3 Dementia in other diseases classified elsewhere, mild, with mood disturbance: Secondary | ICD-10-CM | POA: Diagnosis not present

## 2024-05-30 DIAGNOSIS — E782 Mixed hyperlipidemia: Secondary | ICD-10-CM | POA: Diagnosis not present

## 2024-05-30 DIAGNOSIS — K2100 Gastro-esophageal reflux disease with esophagitis, without bleeding: Secondary | ICD-10-CM | POA: Diagnosis not present

## 2024-05-30 DIAGNOSIS — N390 Urinary tract infection, site not specified: Secondary | ICD-10-CM | POA: Diagnosis not present

## 2024-05-30 DIAGNOSIS — A0811 Acute gastroenteropathy due to Norwalk agent: Secondary | ICD-10-CM | POA: Diagnosis not present

## 2024-05-30 DIAGNOSIS — N1831 Chronic kidney disease, stage 3a: Secondary | ICD-10-CM | POA: Diagnosis not present

## 2024-05-30 DIAGNOSIS — E876 Hypokalemia: Secondary | ICD-10-CM | POA: Diagnosis not present

## 2024-05-30 DIAGNOSIS — I2510 Atherosclerotic heart disease of native coronary artery without angina pectoris: Secondary | ICD-10-CM | POA: Diagnosis not present

## 2024-05-30 DIAGNOSIS — K5730 Diverticulosis of large intestine without perforation or abscess without bleeding: Secondary | ICD-10-CM | POA: Diagnosis not present

## 2024-05-30 DIAGNOSIS — E559 Vitamin D deficiency, unspecified: Secondary | ICD-10-CM | POA: Diagnosis not present

## 2024-05-30 DIAGNOSIS — G2581 Restless legs syndrome: Secondary | ICD-10-CM | POA: Diagnosis not present

## 2024-05-30 NOTE — Transitions of Care (Post Inpatient/ED Visit) (Signed)
   05/30/2024  Name: Elizabeth Mcintyre MRN: [id number] DOB: 27-Jul-1941  Today's TOC FU Call Status: Today's TOC FU Call Status:: Unsuccessful Call (1st Attempt) Unsuccessful Call (1st Attempt) Date: 05/30/24  Attempted to reach the patient regarding the most recent Inpatient/ED visit.  Follow Up Plan: Additional outreach attempts will be made to reach the patient to complete the Transitions of Care (Post Inpatient/ED visit) call.   Mehran Guderian J. Cecylia Brazill RN, MSN [facility], [facility] RN Care Manager Direct Dial: [phone]  Fax: [phone] Website: [email or web address]

## 2024-05-30 NOTE — Telephone Encounter (Signed)
 Pharmacy Patient Advocate Encounter   Received notification from Fax that prior authorization for Lidocaine  5% patches  is required/requested.   Insurance verification completed.   The patient is insured through [facility] ADVANT[employer]/RX ADVANCE .   Per test claim: PA required; PA submitted to above mentioned insurance via Latent Key/confirmation #/EOC [id number] Status is pending

## 2024-05-30 NOTE — Telephone Encounter (Signed)
 Copied from CRM #[id number]. Topic: Clinical - Home Health Verbal Orders >> May 30, 2024 11:10 AM [place] MATSU wrote: Caller/Agency: [facility]  Callback Number: [phone] Service Requested: Skilled Nursing Frequency: 1 a week for 5 weeks  Any new concerns about the patient? No

## 2024-05-30 NOTE — Telephone Encounter (Signed)
 Spoke to [place] to approve orders.

## 2024-05-31 ENCOUNTER — Ambulatory Visit: Payer: Self-pay

## 2024-05-31 ENCOUNTER — Telehealth: Payer: Self-pay

## 2024-05-31 NOTE — Transitions of Care (Post Inpatient/ED Visit) (Addendum)
 Today's TOC FU Call Status: Today's TOC FU Call Status:: Successful TOC FU Call Completed Unsuccessful Call (1st Attempt) Date: 05/30/24 [facility] FU Call Complete Date: 05/31/24 Patient's Name and Date of Birth confirmed.  Transition Care Management Follow-up Telephone Call Date of Discharge: 05/28/24 Discharge Facility: [facility]) Type of Discharge: Inpatient Admission Primary Inpatient Discharge Diagnosis:: C.Diff, Severe sepsis from UTI How have you been since you were released from the hospital?: Better Any questions or concerns?: Yes Patient Questions/Concerns:: Daughter as informant has multiple concerns and RN CM will document in TOC note. Patient Questions/Concerns Addressed: Other:  Items Reviewed: Did you receive and understand the discharge instructions provided?: Yes Medications obtained,verified, and reconciled?: Yes (Medications Reviewed) (Daughter did not have medications list/AVS paperwork with her. She noted compliance concerns surrounding patient living with a brother that is supposed to be managing medications.) Any new allergies since your discharge?: No  Medications Reviewed Today: Unable to review, daughter as informant for this call and not with patient, did not have AVS or med list with her. Compliance concerns expressed.  Medications Reviewed Today   Medications were not reviewed in this encounter     Home Care and Equipment/Supplies: Were Home Health Services Ordered?: Yes Name of Home Health Agency:: [facility] Has Agency set up a time to come to your home?: Yes First Home Health Visit Date: 05/30/24 Any new equipment or medical supplies ordered?: No  Functional Questionnaire: Do you need assistance with bathing/showering or dressing?: Yes (Lives in her house with brother. Takes a sink bath, getting her hair washed is an issue. Remind her to watch her take a shower. Handicap handles on the wall. Brother assist wtih medication management but daughter has some  concerns.) Do you need assistance with meal preparation?: Yes (Brother to be assisting with medications/meals/ transportation.) Do you need assistance with eating?: No Do you have difficulty maintaining continence: No Do you need assistance with getting out of bed/getting out of a chair/moving?: No Do you have difficulty managing or taking your medications?: Yes (Brother assist along with daughter.)  Follow up appointments reviewed: PCP Follow-up appointment confirmed?:  (Daugher will make Hospital discharge follow up appointment today.) MD Provider Line Number:[phone] Given: No Specialist Hospital Follow-up appointment confirmed?: NA Do you need transportation to your follow-up appointment?: No Do you understand care options if your condition(s) worsen?: Yes-patient verbalized understanding (Per daugher as informant on this call.)  05/31/24: TOC RN CM spoke with daughter as contact due to dementia and patient currently living in her long term home with a brother.   Only partial information was able to be obtained as daughter was not present with patient and issues below were raised and discussed.   Sari Masters, daughter and emergency contact, states she has HCPOA and another daughter has POA and there is financial concerns as well as health concerns, and living conditions concerns. Daughter states patient's brother took patient away from living with this daughter last year and patient refuses to live with either daughter wanting to remain in long term home husband built. Brother is to be managing care of patient including obtaining and organizing/giving medications. Daughters feel brother is living there for the $$, is not managing things well or best for patient and she said brother will say he is giving her meds if RN CM were to try to call him for a review. She reports a check with pharmacy shows no meds filled since last year per daughter, and a recent pharmacy change that did not  have proper  insurance information or patient information attached.   When daughter tries to intervene or obtain some control over some of these issues or needs, the mother/patient is very defensive of her brother and non-compliant for various reasons including dementia/memory issues per daughter reports.  Daughter also reports to RN CM that patient living conditions are bad: patient has recent C.Diff, multiple cats breeding under her bed, piles of used depends not properly discarded, poor hygienic practices, concerns brother maybe mishandling finances and is non compliant in health management of patient after taking control/moving her back into her home last October and stating he would manage all of this.   After some discussion RN CM discussed option of daughter to reach out to [facility] to discuss all options as she states she as given up trying to get between her mother and brother but has real concerns. Instructions on how to contact [facility] were given and to make a HFU ASAP with PCP for HFU and to follow up on compliance/medications issues, and to be aware of situation.    RN CM will check back this week to see if she was able to get a DSS SW on the case to assist potential assessments/course of action for patient's best interests and well being.  RN CM will reassess need to contact [facility] if daughter did not follow through or is afraid to so for fear of repercussions from patient or brother even though it would be anonymous reporting. Daughter agreed with this plan and was appreciative of discussion and a plan made.   This note was routed via EPIC to PCP provider.   Bing Edison MSN, RN RN Case Sales executive Health  [facility] Office Hours M-F [phone] Direct Dial: [phone] Main Phone [phone]  Fax: [phone] [email or web address]

## 2024-05-31 NOTE — Telephone Encounter (Signed)
 Pharmacy Patient Advocate Encounter  Received notification from [employer] that Prior Authorization for  Lidocaine  5% patches  has been DENIED.  Full denial letter will be uploaded to the media tab. See denial reason below.   PA #/Case ID/Reference #: [id number]

## 2024-05-31 NOTE — Telephone Encounter (Signed)
 FYI Only or Action Required?: Action required by provider: request for appointment.  Patient was last seen in primary care on 05/10/2024 by [name], MD.  Called Nurse Triage reporting Diarrhea.  Symptoms began several weeks ago.  Interventions attempted: Prescription medications: Vancomycin .  Symptoms are: stable.  Triage Disposition: No disposition on file.  Patient/caregiver understands and will follow disposition?:  Reason for Disposition  C. diff, questions about  Answer Assessment - Initial Assessment Questions Patients daughter Sari calling on behalf of patient. Released from hospital, had Cdiff in the hospital. Looking to schedule hospital f/u. PCP is booked out past 2 weeks, please call Sari back at [phone] to schedule hosp f/u within 2 weeks.  1. ANTIBIOTIC: What antibiotic are you taking? How many times per day?     Vancomycin  125 MG 4x daily  2. ANTIBIOTIC ONSET: When was the antibiotic started?     9/13-9/18  3. DIARRHEA SEVERITY: How bad is the diarrhea? How many more stools have you had in the past 24 hours than normal?      Daughter unsure, stated if patient is still having diarrhea she does not consider that better. Advised daughter cdiff can take some time to clear up, daughter could not answer if pt is still having occasional bouts of diarrhea or if she is going constantly.  4. ONSET: When did the diarrhea begin?      2 weeks  5. VOMITING: Are you also vomiting? If Yes, ask: How many times in the past 24 hours?      No  6. ABDOMEN PAIN: Are you having any abdomen pain? If Yes, ask: What does it feel like? (e.g., crampy, dull, intermittent, constant)      Yes, hard to separate that from cdiff  7. OTHER SYMPTOMS: Do you have any other symptoms? (e.g., fever, blood in stool)       No  Protocols used: Diarrhea on Antibiotics-A-AH  Copied from CRM #[id number]. Topic: Clinical - Red Word Triage >> May 31, 2024  1:35 PM Winona R  wrote: Pt need hospital follow up but is still experiencing Diarrhea- Daughter Sari is on the line

## 2024-06-01 NOTE — Telephone Encounter (Signed)
 scheduled

## 2024-06-03 ENCOUNTER — Telehealth: Payer: Self-pay

## 2024-06-03 ENCOUNTER — Other Ambulatory Visit: Payer: Self-pay | Admitting: Internal Medicine

## 2024-06-03 DIAGNOSIS — N3000 Acute cystitis without hematuria: Secondary | ICD-10-CM

## 2024-06-03 MED ORDER — AMOXICILLIN-POT CLAVULANATE 875-125 MG PO TABS: 1.0000 | ORAL_TABLET | Freq: Two times a day (BID) | ORAL | 0 refills | Status: DC

## 2024-06-03 NOTE — Telephone Encounter (Signed)
 Copied from CRM #[id number]. Topic: Clinical - Medical Advice >> Jun 02, 2024  4:29 PM Anairis L wrote: Reason for CRM: Eulalio from [facility] is calling in because she is recommending a medication be added to patient list, due to divertculitis/UTI and stomach tenderness    [phone]

## 2024-06-07 ENCOUNTER — Ambulatory Visit (INDEPENDENT_AMBULATORY_CARE_PROVIDER_SITE_OTHER): Admitting: Internal Medicine

## 2024-06-07 ENCOUNTER — Encounter: Payer: Self-pay | Admitting: Internal Medicine

## 2024-06-07 VITALS — BP 133/79 | HR 92 | Ht 62.0 in | Wt 135.4 lb

## 2024-06-07 DIAGNOSIS — R6520 Severe sepsis without septic shock: Secondary | ICD-10-CM

## 2024-06-07 DIAGNOSIS — E039 Hypothyroidism, unspecified: Secondary | ICD-10-CM

## 2024-06-07 DIAGNOSIS — A0472 Enterocolitis due to Clostridium difficile, not specified as recurrent: Secondary | ICD-10-CM

## 2024-06-07 DIAGNOSIS — A419 Sepsis, unspecified organism: Secondary | ICD-10-CM

## 2024-06-07 DIAGNOSIS — R55 Syncope and collapse: Secondary | ICD-10-CM

## 2024-06-07 DIAGNOSIS — Z09 Encounter for follow-up examination after completed treatment for conditions other than malignant neoplasm: Secondary | ICD-10-CM

## 2024-06-07 NOTE — Assessment & Plan Note (Signed)
 Hospital chart reviewed, including discharge summary Medications reconciled and reviewed with the patient in detail

## 2024-06-07 NOTE — Assessment & Plan Note (Signed)
 Likely due to severe dehydration from diarrhea due to C. difficile colitis Advised to maintain adequate hydration and eat at regular intervals Echocardiogram from recent hospitalization reviewed

## 2024-06-07 NOTE — Patient Instructions (Addendum)
 Please maintain at least 64 ounces of fluid intake in a day and eat at regular intervals.  Please start taking Culturelle probiotic once daily.  Please continue to take medications as prescribed.  Please continue to follow low carb diet and ambulate as tolerated.

## 2024-06-07 NOTE — Assessment & Plan Note (Addendum)
 Lab Results  Component Value Date   TSH 8.890 (H) 05/10/2024   On levothyroxine  75 mcg QD, but had been noncompliant till recently Advised to remain compliant to medications Needs to take levothyroxine  on empty stomach in AM

## 2024-06-07 NOTE — Progress Notes (Signed)
 Established Patient Office Visit  Subjective:  Patient ID: Elizabeth Mcintyre, female    DOB: 1941-08-24  Age: 83 y.o. MRN: [id number]  CC:  Chief Complaint  Patient presents with   Follow-up    Hospital f/u , reports feeling okay.     HPI Elizabeth Mcintyre is a 83 y.o. 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  She presented to ED with complaints of syncope and history of vomiting and diarrhea x 2 weeks. She was at home and apparently passed out and fell on 05/21/24. She can't tell me if she hit her head. Family and patient unsure how long she was out for. Her granddaughter called 911. She denied any fever/chills. She denied any dysuria, urgency or frequency. She had had poor PO intake. Sister states it's been quite awhile that she has eaten well and she tells me she doesn't really like food anymore.  Her stool test was positive for C. difficile.  She was placed on oral vancomycin .  Her urine culture showed E. coli and Streptococcus agalactiae.  She also completed 3 days of IV Rocephin .  Today, she reports improvement in diarrhea and fatigue.  She still has loose BM, but denies watery diarrhea now.  Denies any episode of fever, chills, nausea or vomiting since being discharged to home.  Her appetite is slowly improving.    Past Medical History:  Diagnosis Date   Acquired hypothyroidism 05/30/2014   Acute cystitis without hematuria 10/09/2022   Acute pain of right shoulder 04/16/2022   Asthma    Bloating 04/26/2019   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   Cancer 2021   Lymphoma   Carotid artery stenosis 06/13/2016   Chronic SI joint pain    was on tramadol    Diverticulosis 03/2011   Dysphagia 04/18/2020   Essential hypertension, benign 11/09/2007   Current  med losartan  100 mg qd, amlodopine 10 mg qd   Fall 07/22/2021   Fecal incontinence 07/07/2019   Gastroesophageal reflux disease with esophagitis 03/06/2015   Gout 06/09/2014   R great toe, ball of foot Stopped HCTZ several mos ago,  On losartan         Hematemesis 03/30/2020   Hepatic steatosis 01/04/2020   Herpes zoster without complication 12/02/2022   History of rheumatoid arthritis    during 30's, was treated.   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    Stress incontinence    Thrombocytopenia 01/04/2020   Type II diabetes mellitus 11/09/2007   diet control   Vitamin D  deficiency 05/25/2015    Past Surgical History:  Procedure Laterality Date  ABDOMINAL HYSTERECTOMY     BALLOON DILATION N/A 08/21/2020   Procedure: BALLOON DILATION;  Surgeon: Cindie Carlin POUR, DO;  Location: [facility];  Service: Endoscopy;  Laterality: N/A;   BIOPSY  03/30/2020   Procedure: BIOPSY;  Surgeon: Donnald Charleston, MD;  Location: [facility];  Service: Endoscopy;;   BIOPSY  05/02/2020   Procedure: BIOPSY;  Surgeon: Eartha Flavors, Toribio, MD;  Location: [facility];  Service: Gastroenterology;;   CARDIAC CATHETERIZATION     X 2, last one in 1998   CHOLECYSTECTOMY N/A  04/13/2017   Procedure: LAPAROSCOPIC CHOLECYSTECTOMY;  Surgeon: Mavis Anes, MD;  Location: [facility];  Service: General;  Laterality: N/A;   COLONOSCOPY     COLONOSCOPY  May 2012   Dr. Obie: mild diverticulosis, otherwise normal.    COLONOSCOPY WITH PROPOFOL  N/A 05/02/2020   Dr. Eartha: 8 mm polyp removed from the ascending colon, 2 mm polyp removed from the ascending colon.  Tubular adenomas.  Diverticulosis.  Mucosal ulceration in the sigmoid colon noted, pathology consistent with ischemic colitis.   ESOPHAGOGASTRODUODENOSCOPY N/A 01/28/2015   Dr. Shaaron: reflux esophagitis, Schatzki's ring not manipulated due to recent bleeding   ESOPHAGOGASTRODUODENOSCOPY N/A 03/30/2015   Dr. Shaaron: Schatzki's ring s/p Agapito dilation, previously noted esophageal ulcer completely healed   ESOPHAGOGASTRODUODENOSCOPY N/A 03/30/2020   Buccini: Moderately severe erosive, circumferential, confluent esophagitis with no bleeding found 25 to 40 cm from incisors.  Nonobstructing and mild Schatzki ring, there were also multiple distal esophageal rings noted, minimal hiatal hernia.   ESOPHAGOGASTRODUODENOSCOPY (EGD) WITH PROPOFOL  N/A 08/21/2020   Procedure: ESOPHAGOGASTRODUODENOSCOPY (EGD) WITH PROPOFOL ;  Surgeon: Cindie Carlin POUR, DO;  Location: [facility];  Service: Endoscopy;  Laterality: N/A;  2:15pm   IR IMAGING GUIDED PORT INSERTION  03/13/2020   IR REMOVAL TUN ACCESS W/ PORT W/O FL MOD SED  10/01/2021   LYMPH NODE BIOPSY Left 03/20/2020   Procedure: LEFT POSTERIOR CERVICAL LYMPH NODE BIOPSY;  Surgeon: Sebastian Moles, MD;  Location: [facility];  Service: General;  Laterality: Left;   MALONEY DILATION N/A 03/30/2015   Procedure: AGAPITO HODGKIN;  Surgeon: Charleston CHRISTELLA Shaaron, MD;  Location: [facility];  Service: Endoscopy;  Laterality: N/A;   POLYPECTOMY  05/02/2020   Procedure: POLYPECTOMY;  Surgeon: Eartha Flavors, Toribio, MD;  Location: [facility];  Service: Gastroenterology;;    Family History  Problem  Relation Age of Onset   Heart disease Mother    Osteoarthritis Mother    Sudden death Father    Single kidney Father    Other Father        h/o severe MVA injuries   Hyperlipidemia Sister    Other Daughter        Myalgias   Fibromyalgia Daughter    Allergies Daughter    Heart disease Maternal Grandfather    Sudden death Paternal Grandmother    Diabetes Paternal Grandfather    Heart disease Daughter    Other Daughter        palpitations   Pulmonary fibrosis Maternal Aunt    Cancer Paternal Uncle    Pulmonary fibrosis Maternal Aunt    Colon cancer Neg Hx     Social History   Socioeconomic History   Marital status: Widowed    Spouse name: Not on file   Number of children: 2   Years of education: 12   Highest education level: High school graduate  Occupational History   Occupation: Retired    Comment: Print production planner  Tobacco Use  Smoking status: Never   Smokeless tobacco: Never  Vaping Use   Vaping status: Never Used  Substance and Sexual Activity   Alcohol use: Never   Drug use: Never   Sexual activity: Never  Other Topics Concern   Not on file  Social History Narrative   ** Merged History Encounter **       Ms. Hauter is widowed. Her young grandson lives with her, for whom she shares custody with her daughter, the son's aunt.   Right handed    Social Drivers of Health   Financial Resource Strain: Low Risk  (06/04/2021)   Overall Financial Resource Strain (CARDIA)    Difficulty of Paying Living Expenses: Not hard at all  Food Insecurity: No Food Insecurity (05/21/2024)   Hunger Vital Sign    Worried About Running Out of Food in the Last Year: Never true    Ran Out of Food in the Last Year: Never true  Transportation Needs: No Transportation Needs (05/21/2024)   PRAPARE - Administrator, Civil Service (Medical): No    Lack of Transportation (Non-Medical): No  Physical Activity: Insufficiently Active (05/27/2023)   Exercise Vital Sign    Days of  Exercise per Week: 1 day    Minutes of Exercise per Session: 10 min  Stress: Stress Concern Present (05/27/2023)   [employer] of Occupational Health - Occupational Stress Questionnaire    Feeling of Stress : To some extent  Social Connections: Moderately Integrated (05/21/2024)   Social Connection and Isolation Panel    Frequency of Communication with Friends and Family: Three times a week    Frequency of Social Gatherings with Friends and Family: Three times a week    Attends Religious Services: 1 to 4 times per year    Active Member of Clubs or Organizations: Yes    Attends Banker Meetings: 1 to 4 times per year    Marital Status: Widowed  Intimate Partner Violence: Not At Risk (05/21/2024)   Humiliation, Afraid, Rape, and Kick questionnaire    Fear of Current or Ex-Partner: No    Emotionally Abused: No    Physically Abused: No    Sexually Abused: No    Outpatient Medications Prior to Visit  Medication Sig Dispense Refill   acetaminophen  (TYLENOL ) 325 MG tablet Take 325 mg by mouth every 6 (six) hours as needed for moderate pain.      albuterol  (VENTOLIN  HFA) 108 (90 Base) MCG/ACT inhaler Inhale 2 puffs into the lungs every 6 (six) hours as needed for wheezing or shortness of breath.     amLODipine  (NORVASC ) 10 MG tablet Take 1 tablet (10 mg total) by mouth daily. 90 tablet 1   Ascorbic Acid (VITAMIN C ) 100 MG tablet Take 100 mg by mouth daily.     carvedilol  (COREG ) 3.125 MG tablet Take 1 tablet (3.125 mg total) by mouth 2 (two) times daily with a meal. 180 tablet 3   cetirizine (ZYRTEC) 10 MG tablet Take 10 mg by mouth at bedtime. As needed     Cholecalciferol  (VITAMIN D3) 25 MCG (1000 UT) CAPS Take 1,000 Units by mouth daily.      donepezil  (ARICEPT ) 10 MG tablet Take 1 tablet (10 mg total) by mouth daily. 90 tablet 1   famotidine  (PEPCID ) 40 MG tablet Take 1 tablet (40 mg total) by mouth 2 (two) times daily for 30 days, THEN 1 tablet (40 mg total) daily. 90 tablet  0   levothyroxine  (SYNTHROID ) 75 MCG  tablet Take 1 tablet (75 mcg total) by mouth daily. 90 tablet 1   lidocaine  (LIDODERM ) 5 % Place 1 patch onto the skin daily. Remove & Discard patch within 12 hours or as directed by MD 30 patch 0   mirtazapine  (REMERON ) 15 MG tablet Take 1 tablet (15 mg total) by mouth at bedtime. 90 tablet 2   rOPINIRole  (REQUIP ) 0.25 MG tablet Take 0.25 mg by mouth at bedtime.     rosuvastatin  (CRESTOR ) 10 MG tablet Take 1 tablet (10 mg total) by mouth daily. 90 tablet 1   sucralfate  (CARAFATE ) 1 g tablet TAKE 1 TABLET BY MOUTH 4 TIMES DAILY. 360 tablet 1   vitamin B-12 (CYANOCOBALAMIN ) 1000 MCG tablet Take 1,000 mcg by mouth daily.      amoxicillin -clavulanate (AUGMENTIN ) 875-125 MG tablet Take 1 tablet by mouth 2 (two) times daily. 14 tablet 0   No facility-administered medications prior to visit.    Allergies  Allergen Reactions   Codeine Nausea And Vomiting and Rash    ROS Review of Systems  Constitutional:  Positive for fatigue. Negative for chills and fever.  HENT:  Negative for congestion, sinus pressure, sinus pain and sore throat.   Eyes:  Positive for visual disturbance. Negative for pain and discharge.  Respiratory:  Negative for cough and shortness of breath.   Cardiovascular:  Negative for chest pain and palpitations.  Gastrointestinal:  Negative for nausea and vomiting.       Loose bowel movements  Endocrine: Negative for polydipsia and polyuria.  Genitourinary:  Negative for dysuria and hematuria.  Musculoskeletal:  Positive for arthralgias (Right shoulder). Negative for neck pain and neck stiffness.  Skin:  Negative for rash.  Neurological:  Negative for weakness.  Psychiatric/Behavioral:  Positive for dysphoric mood and sleep disturbance. Negative for agitation and behavioral problems.       Objective:    Physical Exam Vitals reviewed.  Constitutional:      General: She is not in acute distress.    Appearance: She is not diaphoretic.   HENT:     Head: Normocephalic and atraumatic.     Nose: No congestion.     Mouth/Throat:     Mouth: Mucous membranes are moist.  Eyes:     General: No scleral icterus.    Extraocular Movements: Extraocular movements intact.  Cardiovascular:     Rate and Rhythm: Normal rate and regular rhythm.     Heart sounds: Normal heart sounds. No murmur heard. Pulmonary:     Breath sounds: Normal breath sounds. No wheezing or rales.  Abdominal:     General: Bowel sounds are normal.     Palpations: Abdomen is soft.  Musculoskeletal:     Cervical back: Neck supple. No tenderness.     Right lower leg: No edema.     Left lower leg: No edema.     Comments: ROM at right shoulder limited due to pain  Skin:    General: Skin is warm.     Findings: No rash.  Neurological:     General: No focal deficit present.     Mental Status: She is alert and oriented to person, place, and time.     Sensory: No sensory deficit.     Motor: Weakness (B/l LE) present.  Psychiatric:        Mood and Affect: Mood normal.        Behavior: Behavior normal.     BP 133/79   Pulse 92   Ht 5' 2 (1.575  m)   Wt 135 lb 6.4 oz (61.4 kg)   SpO2 99%   BMI 24.76 kg/m  Wt Readings from Last 3 Encounters:  06/07/24 135 lb 6.4 oz (61.4 kg)  05/21/24 137 lb 9.1 oz (62.4 kg)  05/10/24 141 lb (64 kg)    Lab Results  Component Value Date   TSH 8.890 (H) 05/10/2024   Lab Results  Component Value Date   WBC 10.7 (H) 05/27/2024   HGB 14.1 05/27/2024   HCT 40.6 05/27/2024   MCV 87.1 05/27/2024   PLT 223 05/27/2024   Lab Results  Component Value Date   NA 140 05/28/2024   K 4.4 05/28/2024   CO2 21 (L) 05/28/2024   GLUCOSE 123 (H) 05/28/2024   BUN 13 05/28/2024   CREATININE 1.30 (H) 05/28/2024   BILITOT 0.6 05/22/2024   ALKPHOS 65 05/22/2024   AST 40 05/22/2024   ALT 46 (H) 05/22/2024   PROT 6.4 (L) 05/22/2024   ALBUMIN 4.2 05/22/2024   CALCIUM  9.9 05/28/2024   ANIONGAP 12 05/28/2024   EGFR 37 (L)  05/10/2024   GFR 33.87 (L) 12/29/2019   Lab Results  Component Value Date   CHOL 277 (H) 08/03/2023   Lab Results  Component Value Date   HDL 59 08/03/2023   Lab Results  Component Value Date   LDLCALC 99 08/03/2023   Lab Results  Component Value Date   TRIG 702 (HH) 08/03/2023   Lab Results  Component Value Date   CHOLHDL 4.7 (H) 08/03/2023   Lab Results  Component Value Date   HGBA1C 6.2 (H) 08/03/2023      Assessment & Plan:   Problem List Items Addressed This Visit       Digestive   C. difficile colitis   Recent episodes of diarrhea likely due to C. difficile colitis Completed oral vancomycin  X 10 days Still has loose BM, but watery diarrhea is resolved now If persistent or worsening symptoms, will repeat stool for C. difficile Advised to start taking probiotics for now        Endocrine   Hypothyroidism   Lab Results  Component Value Date   TSH 8.890 (H) 05/10/2024   On levothyroxine  75 mcg QD, but had been noncompliant till recently Advised to remain compliant to medications Needs to take levothyroxine  on empty stomach in AM        Other   Syncope   Likely due to severe dehydration from diarrhea due to C. difficile colitis Advised to maintain adequate hydration and eat at regular intervals Echocardiogram from recent hospitalization reviewed      Relevant Orders   CMP14+EGFR   Magnesium    Hospital discharge follow-up   Hospital chart reviewed, including discharge summary Medications reconciled and reviewed with the patient in detail      Severe sepsis from UTI - Primary   Severe sepsis likely due to C. difficile colitis and UTI Sepsis resolved now Advised to maintain adequate hydration      Relevant Orders   CMP14+EGFR   Magnesium    Other Visit Diagnoses       Hypomagnesemia       Relevant Orders   Magnesium          No orders of the defined types were placed in this encounter.   Follow-up: Return if symptoms worsen or  fail to improve.    Suzzane MARLA Blanch, MD

## 2024-06-07 NOTE — Assessment & Plan Note (Signed)
 Severe sepsis likely due to C. difficile colitis and UTI Sepsis resolved now Advised to maintain adequate hydration

## 2024-06-07 NOTE — Assessment & Plan Note (Addendum)
 Recent episodes of diarrhea likely due to C. difficile colitis Completed oral vancomycin  X 10 days Still has loose BM, but watery diarrhea is resolved now If persistent or worsening symptoms, will repeat stool for C. difficile Advised to start taking probiotics for now

## 2024-06-08 ENCOUNTER — Ambulatory Visit: Payer: Self-pay | Admitting: Internal Medicine

## 2024-06-08 LAB — CMP14+EGFR
ALT: 42 IU/L — ABNORMAL HIGH (ref 0–32)
AST: 47 IU/L — ABNORMAL HIGH (ref 0–40)
Albumin: 4.7 g/dL (ref 3.7–4.7)
Alkaline Phosphatase: 84 IU/L (ref 48–129)
BUN/Creatinine Ratio: 10 — ABNORMAL LOW (ref 12–28)
BUN: 16 mg/dL (ref 8–27)
Bilirubin Total: 0.5 mg/dL (ref 0.0–1.2)
CO2: 21 mmol/L (ref 20–29)
Calcium: 9.9 mg/dL (ref 8.7–10.3)
Chloride: 100 mmol/L (ref 96–106)
Creatinine, Ser: 1.55 mg/dL — ABNORMAL HIGH (ref 0.57–1.00)
Globulin, Total: 2.3 g/dL (ref 1.5–4.5)
Glucose: 124 mg/dL — ABNORMAL HIGH (ref 70–99)
Potassium: 4.2 mmol/L (ref 3.5–5.2)
Sodium: 140 mmol/L (ref 134–144)
Total Protein: 7 g/dL (ref 6.0–8.5)
eGFR: 33 mL/min/1.73 — ABNORMAL LOW (ref >59)

## 2024-06-08 LAB — MAGNESIUM: Magnesium: 2 mg/dL (ref 1.6–2.3)

## 2024-06-23 ENCOUNTER — Ambulatory Visit: Admitting: Internal Medicine

## 2024-06-30 ENCOUNTER — Telehealth: Payer: Self-pay

## 2024-06-30 NOTE — Telephone Encounter (Signed)
 Copied from CRM [id number]. Topic: General - Other >> Jun 30, 2024 10:39 AM Emylou G wrote: Reason for CRM: [name] notify of discharge - patient is stable status.Elizabeth Mcintyre lives w/son and goals were met.. she is now independent [phone] in case you need to talk to her

## 2024-07-18 ENCOUNTER — Encounter: Payer: Self-pay | Admitting: Radiology

## 2024-07-28 ENCOUNTER — Ambulatory Visit: Admitting: Internal Medicine

## 2024-07-28 ENCOUNTER — Encounter: Payer: Self-pay | Admitting: Internal Medicine

## 2024-07-28 VITALS — BP 121/60 | HR 90 | Ht 62.0 in | Wt 138.5 lb

## 2024-07-28 DIAGNOSIS — N1831 Chronic kidney disease, stage 3a: Secondary | ICD-10-CM

## 2024-07-28 DIAGNOSIS — E782 Mixed hyperlipidemia: Secondary | ICD-10-CM | POA: Diagnosis not present

## 2024-07-28 DIAGNOSIS — I10 Essential (primary) hypertension: Secondary | ICD-10-CM

## 2024-07-28 DIAGNOSIS — Z0001 Encounter for general adult medical examination with abnormal findings: Secondary | ICD-10-CM

## 2024-07-28 DIAGNOSIS — R7303 Prediabetes: Secondary | ICD-10-CM | POA: Diagnosis not present

## 2024-07-28 DIAGNOSIS — F02B18 Dementia in other diseases classified elsewhere, moderate, with other behavioral disturbance: Secondary | ICD-10-CM

## 2024-07-28 DIAGNOSIS — K2100 Gastro-esophageal reflux disease with esophagitis, without bleeding: Secondary | ICD-10-CM | POA: Diagnosis not present

## 2024-07-28 DIAGNOSIS — G309 Alzheimer's disease, unspecified: Secondary | ICD-10-CM | POA: Diagnosis not present

## 2024-07-28 DIAGNOSIS — Z780 Asymptomatic menopausal state: Secondary | ICD-10-CM | POA: Diagnosis not present

## 2024-07-28 DIAGNOSIS — E039 Hypothyroidism, unspecified: Secondary | ICD-10-CM | POA: Diagnosis not present

## 2024-07-28 MED ORDER — FAMOTIDINE 40 MG PO TABS: 40.0000 mg | ORAL_TABLET | Freq: Every day | ORAL | 3 refills | Status: AC

## 2024-07-28 NOTE — Assessment & Plan Note (Signed)
 Lab Results  Component Value Date   TSH 8.890 (H) 05/10/2024   On levothyroxine  75 mcg QD, but had been noncompliant till recently Advised to remain compliant to medications Needs to take levothyroxine  on empty stomach in AM

## 2024-07-28 NOTE — Assessment & Plan Note (Signed)
 BP Readings from Last 1 Encounters:  07/28/24 121/60    Well-controlled with Amlodipine  10 mg QD and Coreg  3.125 mg BID (considering tachycardia) Counseled for compliance with the medications Advised DASH diet

## 2024-07-28 NOTE — Patient Instructions (Addendum)
 Schedule your Medicare Annual Wellness Visit at checkout.  Please continue to take medications as prescribed.  Please continue to follow low carb diet and perform moderate exercise/walking as tolerated.  Please maintain at least 64 ounces of fluid intake in a day - avoid soft drinks and increase portion of water .

## 2024-07-28 NOTE — Assessment & Plan Note (Addendum)
 Her atypical chest pain is likely due to GERD Continue Pepcid  40 mg QD Has sucralfate  for persistent acid reflux Avoid hot and spicy food Needs to take at least 2-3 meals in a day

## 2024-07-28 NOTE — Assessment & Plan Note (Signed)
 Continue Crestor  10 mg QD Check lipid profile

## 2024-07-28 NOTE — Assessment & Plan Note (Signed)
 Checked last CMP, GFR was 33, GFR stays around 45 - needs to improve hydration Avoid nephrotoxic agents

## 2024-07-28 NOTE — Progress Notes (Signed)
 Established Patient Office Visit  Subjective:  Patient ID: Elizabeth Mcintyre, female    DOB: 1941-07-22  Age: 83 y.o. MRN: [id number]  CC:  Chief Complaint  Patient presents with   Follow-up    6 week f/u     HPI Elizabeth Mcintyre is a 83 y.o. 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  HTN: BP is well-controlled. Takes amlodipine  10 mg QD and Coreg  3.125 mg BID regularly now. Patient denies headache, dizziness, chest pain, dyspnea or palpitations.  Hypothyroidism: Her TSH was elevated in the last visit.  Of note, she has started taking levothyroxine  regularly now.  Her son is helping her to take medicines regularly now.  Alzheimer's dementia: She has had episodes of confusion and insomnia, but reports recent improvement.  She also has been feeling down, especially on cold and cloudy days.  She was prescribed Remeron , and reports taking it regularly now.  She is on Aricept  for dementia, followed by neurology.  She has gained about 3 lbs since the visit in 08/25.  Her appetite has improved with Remeron  as well.  Denies any nausea, vomiting or diarrhea currently.  Past Medical History:  Diagnosis Date   Acquired hypothyroidism 05/30/2014   Acute cystitis without hematuria 10/09/2022   Acute pain of right shoulder 04/16/2022   Asthma    Bloating 04/26/2019   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   Cancer 2021   Lymphoma   Carotid artery stenosis 06/13/2016   Chronic SI joint pain    was on tramadol    Diverticulosis 03/2011   Dysphagia 04/18/2020   Essential hypertension, benign 11/09/2007   Current med losartan  100 mg qd, amlodopine 10 mg qd   Fall 07/22/2021   Fecal incontinence 07/07/2019   Gastroesophageal reflux disease with esophagitis 03/06/2015   Gout 06/09/2014   R  great toe, ball of foot Stopped HCTZ several mos ago,  On losartan         Hematemesis 03/30/2020   Hepatic steatosis 01/04/2020   Herpes zoster without complication 12/02/2022   History of rheumatoid arthritis    during 30's, was treated.   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    Stress incontinence    Thrombocytopenia 01/04/2020   Type II diabetes mellitus 11/09/2007   diet control   Vitamin D  deficiency 05/25/2015    Past Surgical History:  Procedure Laterality Date   ABDOMINAL HYSTERECTOMY     BALLOON DILATION N/A 08/21/2020   Procedure: BALLOON DILATION;  Surgeon: Cindie Carlin POUR, DO;  Location: [facility];  Service: Endoscopy;  Laterality: N/A;   BIOPSY  03/30/2020   Procedure: BIOPSY;  Surgeon: Donnald Charleston, MD;  Location: [facility];  Service: Endoscopy;;   BIOPSY  05/02/2020   Procedure: BIOPSY;  Surgeon: Eartha Flavors, Toribio, MD;  Location: [facility];  Service: Gastroenterology;;   CARDIAC CATHETERIZATION     X 2, last one in 1998   CHOLECYSTECTOMY N/A 04/13/2017   Procedure: LAPAROSCOPIC CHOLECYSTECTOMY;  Surgeon: Mavis Anes, MD;  Location: [facility];  Service: General;  Laterality: N/A;   COLONOSCOPY     COLONOSCOPY  May 2012   Dr.  Obie: mild diverticulosis, otherwise normal.    COLONOSCOPY WITH PROPOFOL  N/A 05/02/2020   Dr. Eartha: 8 mm polyp removed from the ascending colon, 2 mm polyp removed from the ascending colon.  Tubular adenomas.  Diverticulosis.  Mucosal ulceration in the sigmoid colon noted, pathology consistent with ischemic colitis.   ESOPHAGOGASTRODUODENOSCOPY N/A 01/28/2015   Dr. Shaaron: reflux esophagitis, Schatzki's ring not manipulated due to recent bleeding   ESOPHAGOGASTRODUODENOSCOPY N/A 03/30/2015   Dr. Shaaron: Schatzki's ring s/p Agapito dilation, previously noted esophageal ulcer completely healed   ESOPHAGOGASTRODUODENOSCOPY N/A 03/30/2020   Buccini: Moderately severe erosive, circumferential, confluent esophagitis with no bleeding found 25 to 40 cm from incisors.  Nonobstructing and mild Schatzki ring, there were also multiple distal esophageal rings noted, minimal hiatal hernia.   ESOPHAGOGASTRODUODENOSCOPY (EGD) WITH PROPOFOL  N/A 08/21/2020   Procedure: ESOPHAGOGASTRODUODENOSCOPY (EGD) WITH PROPOFOL ;  Surgeon: Cindie Carlin POUR, DO;  Location: [facility];  Service: Endoscopy;  Laterality: N/A;  2:15pm   IR IMAGING GUIDED PORT INSERTION  03/13/2020   IR REMOVAL TUN ACCESS W/ PORT W/O FL MOD SED  10/01/2021   LYMPH NODE BIOPSY Left 03/20/2020   Procedure: LEFT POSTERIOR CERVICAL LYMPH NODE BIOPSY;  Surgeon: Sebastian Moles, MD;  [facility];  Service: General;  Laterality: Left;   MALONEY DILATION N/A 03/30/2015   Procedure: AGAPITO HODGKIN;  Surgeon: Charleston CHRISTELLA Shaaron, MD;  Location: [facility];  Service: Endoscopy;  Laterality: N/A;   POLYPECTOMY  05/02/2020   Procedure: POLYPECTOMY;  Surgeon: Eartha Flavors, Toribio, MD;  Location: [facility];  Service: Gastroenterology;;    Family History  Problem Relation Age of Onset   Heart disease Mother    Osteoarthritis Mother    Sudden death Father    Single kidney Father    Other Father        h/o severe MVA injuries   Hyperlipidemia  Sister    Other Daughter        Myalgias   Fibromyalgia Daughter    Allergies Daughter    Heart disease Maternal Grandfather    Sudden death Paternal Grandmother    Diabetes Paternal Grandfather    Heart disease Daughter    Other Daughter        palpitations   Pulmonary fibrosis Maternal Aunt    Cancer Paternal Uncle    Pulmonary fibrosis Maternal Aunt    Colon cancer Neg Hx     Social History   Socioeconomic History   Marital status: Widowed    Spouse name: Not on file   Number of children: 2   Years of education: 12   Highest education level: High school graduate  Occupational History   Occupation: Retired    Comment: Print production planner  Tobacco Use   Smoking status: Never   Smokeless tobacco: Never  Vaping Use   Vaping status: Never Used  Substance and Sexual Activity   Alcohol use: Never   Drug use: Never  Sexual activity: Never  Other Topics Concern   Not on file  Social History Narrative   ** Merged History Encounter **       Ms. Kearse is widowed. Her young grandson lives with her, for whom she shares custody with her daughter, the son's aunt.   Right handed    Social Drivers of Health   Financial Resource Strain: Low Risk  (06/04/2021)   Overall Financial Resource Strain (CARDIA)    Difficulty of Paying Living Expenses: Not hard at all  Food Insecurity: No Food Insecurity (05/21/2024)   Hunger Vital Sign    Worried About Running Out of Food in the Last Year: Never true    Ran Out of Food in the Last Year: Never true  Transportation Needs: No Transportation Needs (05/21/2024)   PRAPARE - Administrator, Civil Service (Medical): No    Lack of Transportation (Non-Medical): No  Physical Activity: Insufficiently Active (05/27/2023)   Exercise Vital Sign    Days of Exercise per Week: 1 day    Minutes of Exercise per Session: 10 min  Stress: Stress Concern Present (05/27/2023)   [employer] of Occupational Health - Occupational Stress  Questionnaire    Feeling of Stress : To some extent  Social Connections: Moderately Integrated (05/21/2024)   Social Connection and Isolation Panel    Frequency of Communication with Friends and Family: Three times a week    Frequency of Social Gatherings with Friends and Family: Three times a week    Attends Religious Services: 1 to 4 times per year    Active Member of Clubs or Organizations: Yes    Attends Banker Meetings: 1 to 4 times per year    Marital Status: Widowed  Intimate Partner Violence: Not At Risk (05/21/2024)   Humiliation, Afraid, Rape, and Kick questionnaire    Fear of Current or Ex-Partner: No    Emotionally Abused: No    Physically Abused: No    Sexually Abused: No    Outpatient Medications Prior to Visit  Medication Sig Dispense Refill   acetaminophen  (TYLENOL ) 325 MG tablet Take 325 mg by mouth every 6 (six) hours as needed for moderate pain.      albuterol  (VENTOLIN  HFA) 108 (90 Base) MCG/ACT inhaler Inhale 2 puffs into the lungs every 6 (six) hours as needed for wheezing or shortness of breath.     amLODipine  (NORVASC ) 10 MG tablet Take 1 tablet (10 mg total) by mouth daily. 90 tablet 1   Ascorbic Acid (VITAMIN C ) 100 MG tablet Take 100 mg by mouth daily.     carvedilol  (COREG ) 3.125 MG tablet Take 1 tablet (3.125 mg total) by mouth 2 (two) times daily with a meal. 180 tablet 3   cetirizine (ZYRTEC) 10 MG tablet Take 10 mg by mouth at bedtime. As needed     Cholecalciferol  (VITAMIN D3) 25 MCG (1000 UT) CAPS Take 1,000 Units by mouth daily.      donepezil  (ARICEPT ) 10 MG tablet Take 1 tablet (10 mg total) by mouth daily. 90 tablet 1   levothyroxine  (SYNTHROID ) 75 MCG tablet Take 1 tablet (75 mcg total) by mouth daily. 90 tablet 1   lidocaine  (LIDODERM ) 5 % Place 1 patch onto the skin daily. Remove & Discard patch within 12 hours or as directed by MD 30 patch 0   mirtazapine  (REMERON ) 15 MG tablet Take 1 tablet (15 mg total) by mouth at bedtime. 90 tablet  2   rOPINIRole  (REQUIP ) 0.25  MG tablet Take 0.25 mg by mouth at bedtime.     rosuvastatin  (CRESTOR ) 10 MG tablet Take 1 tablet (10 mg total) by mouth daily. 90 tablet 1   sucralfate  (CARAFATE ) 1 g tablet TAKE 1 TABLET BY MOUTH 4 TIMES DAILY. 360 tablet 1   vitamin B-12 (CYANOCOBALAMIN ) 1000 MCG tablet Take 1,000 mcg by mouth daily.      famotidine  (PEPCID ) 40 MG tablet Take 1 tablet (40 mg total) by mouth 2 (two) times daily for 30 days, THEN 1 tablet (40 mg total) daily. 90 tablet 0   No facility-administered medications prior to visit.    Allergies  Allergen Reactions   Codeine Nausea And Vomiting and Rash    ROS Review of Systems  Constitutional:  Positive for fatigue. Negative for chills and fever.  HENT:  Negative for congestion, sinus pressure, sinus pain and sore throat.   Eyes:  Positive for visual disturbance. Negative for pain and discharge.  Respiratory:  Negative for cough and shortness of breath.   Cardiovascular:  Negative for chest pain and palpitations.  Gastrointestinal:  Negative for nausea and vomiting.  Endocrine: Negative for polydipsia and polyuria.  Genitourinary:  Negative for dysuria and hematuria.  Musculoskeletal:  Positive for arthralgias (Right shoulder). Negative for neck pain and neck stiffness.  Skin:  Negative for rash.  Neurological:  Negative for weakness.  Psychiatric/Behavioral:  Positive for dysphoric mood and sleep disturbance. Negative for agitation and behavioral problems.       Objective:    Physical Exam Vitals reviewed.  Constitutional:      General: She is not in acute distress.    Appearance: She is not diaphoretic.  HENT:     Head: Normocephalic and atraumatic.     Nose: No congestion.     Mouth/Throat:     Mouth: Mucous membranes are moist.  Eyes:     General: No scleral icterus.    Extraocular Movements: Extraocular movements intact.  Cardiovascular:     Rate and Rhythm: Normal rate and regular rhythm.     Heart sounds:  Normal heart sounds. No murmur heard. Pulmonary:     Breath sounds: Normal breath sounds. No wheezing or rales.  Abdominal:     General: Bowel sounds are normal.     Palpations: Abdomen is soft.  Musculoskeletal:     Cervical back: Neck supple. No tenderness.     Right lower leg: No edema.     Left lower leg: No edema.     Comments: ROM at right shoulder limited due to pain  Skin:    General: Skin is warm.     Findings: No rash.  Neurological:     General: No focal deficit present.     Mental Status: She is alert and oriented to person, place, and time.     Cranial Nerves: No cranial nerve deficit.     Sensory: No sensory deficit.     Motor: Weakness (B/l LE) present.  Psychiatric:        Mood and Affect: Mood normal.        Behavior: Behavior normal.     BP 121/60   Pulse 90   Ht 5' 2 (1.575 m)   Wt 138 lb 8 oz (62.8 kg)   SpO2 99%   BMI 25.33 kg/m  Wt Readings from Last 3 Encounters:  07/28/24 138 lb 8 oz (62.8 kg)  06/07/24 135 lb 6.4 oz (61.4 kg)  05/21/24 137 lb 9.1 oz (62.4 kg)  Lab Results  Component Value Date   TSH 8.890 (H) 05/10/2024   Lab Results  Component Value Date   WBC 10.7 (H) 05/27/2024   HGB 14.1 05/27/2024   HCT 40.6 05/27/2024   MCV 87.1 05/27/2024   PLT 223 05/27/2024   Lab Results  Component Value Date   NA 140 06/07/2024   K 4.2 06/07/2024   CO2 21 06/07/2024   GLUCOSE 124 (H) 06/07/2024   BUN 16 06/07/2024   CREATININE 1.55 (H) 06/07/2024   BILITOT 0.5 06/07/2024   ALKPHOS 84 06/07/2024   AST 47 (H) 06/07/2024   ALT 42 (H) 06/07/2024   PROT 7.0 06/07/2024   ALBUMIN 4.7 06/07/2024   CALCIUM  9.9 06/07/2024   ANIONGAP 12 05/28/2024   EGFR 33 (L) 06/07/2024   GFR 33.87 (L) 12/29/2019   Lab Results  Component Value Date   CHOL 277 (H) 08/03/2023   Lab Results  Component Value Date   HDL 59 08/03/2023   Lab Results  Component Value Date   LDLCALC 99 08/03/2023   Lab Results  Component Value Date   TRIG 702 (HH)  08/03/2023   Lab Results  Component Value Date   CHOLHDL 4.7 (H) 08/03/2023   Lab Results  Component Value Date   HGBA1C 6.2 (H) 08/03/2023      Assessment & Plan:   Problem List Items Addressed This Visit       Cardiovascular and Mediastinum   Essential hypertension - Primary   BP Readings from Last 1 Encounters:  07/28/24 121/60    Well-controlled with Amlodipine  10 mg QD and Coreg  3.125 mg BID (considering tachycardia) Counseled for compliance with the medications Advised DASH diet      Relevant Orders   CMP14+EGFR   CBC with Differential/Platelet     Digestive   Gastroesophageal reflux disease with esophagitis   Her atypical chest pain is likely due to GERD Continue Pepcid  40 mg QD Has sucralfate  for persistent acid reflux Avoid hot and spicy food Needs to take at least 2-3 meals in a day      Relevant Medications   famotidine  (PEPCID ) 40 MG tablet     Endocrine   Hypothyroidism   Lab Results  Component Value Date   TSH 8.890 (H) 05/10/2024   On levothyroxine  75 mcg QD, but had been noncompliant till recently Advised to remain compliant to medications Needs to take levothyroxine  on empty stomach in AM      Relevant Orders   TSH + free T4     Nervous and Auditory   Moderate Alzheimer's dementia (HCC)   On donepezil  10 mg QD, had been noncompliant in the past Her son reports worsening of her memory deficits, but he is helping her arrange her medications now Restart Remeron  15 mg nightly, can help with insomnia and improving appetite      Relevant Orders   CMP14+EGFR   CBC with Differential/Platelet     Genitourinary   Stage 3a chronic kidney disease   Checked last CMP, GFR was 33, GFR stays around 45 - needs to improve hydration Avoid nephrotoxic agents      Relevant Orders   VITAMIN D  25 Hydroxy (Vit-D Deficiency, Fractures)   CMP14+EGFR   CBC with Differential/Platelet   Microalbumin / creatinine urine ratio     Other   Mixed  hyperlipidemia   Continue Crestor  10 mg QD Check lipid profile      Relevant Orders   Lipid panel   Encounter for general  adult medical examination with abnormal findings   Physical exam as documented. Fasting blood tests today. Advised to take Shingrix and Tdap vaccines at local pharmacy.      Prediabetes   Lab Results  Component Value Date   HGBA1C 6.2 (H) 08/03/2023   Advised to follow low-carb diet      Relevant Orders   Hemoglobin A1c   CMP14+EGFR   Other Visit Diagnoses       Postmenopausal       Relevant Orders   DG Bone Density        Meds ordered this encounter  Medications   famotidine  (PEPCID ) 40 MG tablet    Sig: Take 1 tablet (40 mg total) by mouth daily.    Dispense:  90 tablet    Refill:  3    Follow-up: Return in about 4 months (around 11/25/2024) for HTN and hypothyroidism.    Suzzane MARLA Blanch, MD

## 2024-07-28 NOTE — Assessment & Plan Note (Signed)
 On donepezil  10 mg QD, had been noncompliant in the past Her son reports worsening of her memory deficits, but he is helping her arrange her medications now Restart Remeron  15 mg nightly, can help with insomnia and improving appetite

## 2024-07-28 NOTE — Assessment & Plan Note (Signed)
Physical exam as documented. Fasting blood tests today. Advised to take Shingrix and Tdap vaccines at local pharmacy.

## 2024-07-28 NOTE — Assessment & Plan Note (Signed)
 Lab Results  Component Value Date   HGBA1C 6.2 (H) 08/03/2023   Advised to follow low-carb diet

## 2024-07-29 ENCOUNTER — Ambulatory Visit: Payer: Self-pay | Admitting: Internal Medicine

## 2024-07-29 ENCOUNTER — Other Ambulatory Visit: Payer: Self-pay | Admitting: Internal Medicine

## 2024-07-29 DIAGNOSIS — E039 Hypothyroidism, unspecified: Secondary | ICD-10-CM

## 2024-07-29 LAB — HEMOGLOBIN A1C
Est. average glucose Bld gHb Est-mCnc: 123 mg/dL
Hgb A1c MFr Bld: 5.9 % — ABNORMAL HIGH (ref 4.8–5.6)

## 2024-07-29 LAB — CBC WITH DIFFERENTIAL/PLATELET
Basophils Absolute: 0.1 x10E3/uL (ref 0.0–0.2)
Basos: 1 %
EOS (ABSOLUTE): 0.7 x10E3/uL — ABNORMAL HIGH (ref 0.0–0.4)
Eos: 5 %
Hematocrit: 42.3 % (ref 34.0–46.6)
Hemoglobin: 14.3 g/dL (ref 11.1–15.9)
Immature Grans (Abs): 0.1 x10E3/uL (ref 0.0–0.1)
Immature Granulocytes: 1 %
Lymphocytes Absolute: 4.8 x10E3/uL — ABNORMAL HIGH (ref 0.7–3.1)
Lymphs: 34 %
MCH: 31 pg (ref 26.6–33.0)
MCHC: 33.8 g/dL (ref 31.5–35.7)
MCV: 92 fL (ref 79–97)
Monocytes Absolute: 1.1 x10E3/uL — ABNORMAL HIGH (ref 0.1–0.9)
Monocytes: 7 %
Neutrophils Absolute: 7.6 x10E3/uL — ABNORMAL HIGH (ref 1.4–7.0)
Neutrophils: 52 %
Platelets: 250 x10E3/uL (ref 150–450)
RBC: 4.62 x10E6/uL (ref 3.77–5.28)
RDW: 13 % (ref 11.7–15.4)
WBC: 14.4 x10E3/uL — ABNORMAL HIGH (ref 3.4–10.8)

## 2024-07-29 LAB — LIPID PANEL
Chol/HDL Ratio: 4.8 ratio — ABNORMAL HIGH (ref 0.0–4.4)
Cholesterol, Total: 229 mg/dL — ABNORMAL HIGH (ref 100–199)
HDL: 48 mg/dL (ref >39)
LDL Chol Calc (NIH): 80 mg/dL (ref 0–99)
Triglycerides: 631 mg/dL (ref 0–149)
VLDL Cholesterol Cal: 101 mg/dL — ABNORMAL HIGH (ref 5–40)

## 2024-07-29 LAB — CMP14+EGFR
ALT: 29 IU/L (ref 0–32)
AST: 34 IU/L (ref 0–40)
Albumin: 4.6 g/dL (ref 3.7–4.7)
Alkaline Phosphatase: 98 IU/L (ref 48–129)
BUN/Creatinine Ratio: 13 (ref 12–28)
BUN: 19 mg/dL (ref 8–27)
Bilirubin Total: 0.4 mg/dL (ref 0.0–1.2)
CO2: 22 mmol/L (ref 20–29)
Calcium: 9.8 mg/dL (ref 8.7–10.3)
Chloride: 99 mmol/L (ref 96–106)
Creatinine, Ser: 1.47 mg/dL — ABNORMAL HIGH (ref 0.57–1.00)
Globulin, Total: 2.4 g/dL (ref 1.5–4.5)
Glucose: 133 mg/dL — ABNORMAL HIGH (ref 70–99)
Potassium: 4.6 mmol/L (ref 3.5–5.2)
Sodium: 139 mmol/L (ref 134–144)
Total Protein: 7 g/dL (ref 6.0–8.5)
eGFR: 35 mL/min/1.73 — ABNORMAL LOW (ref >59)

## 2024-07-29 LAB — TSH+FREE T4
Free T4: 0.78 ng/dL — ABNORMAL LOW (ref 0.82–1.77)
TSH: 17.9 u[IU]/mL — ABNORMAL HIGH (ref 0.450–4.500)

## 2024-07-29 LAB — VITAMIN D 25 HYDROXY (VIT D DEFICIENCY, FRACTURES): Vit D, 25-Hydroxy: 22 ng/mL — ABNORMAL LOW (ref 30.0–100.0)

## 2024-07-29 MED ORDER — LEVOTHYROXINE SODIUM 88 MCG PO TABS: 88.0000 ug | ORAL_TABLET | Freq: Every day | ORAL | 1 refills | Status: AC

## 2024-07-30 LAB — MICROALBUMIN / CREATININE URINE RATIO
Creatinine, Urine: 182 mg/dL
Microalb/Creat Ratio: 12 mg/g{creat} (ref 0–29)
Microalbumin, Urine: 21.2 ug/mL

## 2024-09-19 ENCOUNTER — Encounter: Payer: Self-pay | Admitting: Family Medicine

## 2024-09-19 ENCOUNTER — Ambulatory Visit: Admitting: Family Medicine

## 2024-09-19 ENCOUNTER — Ambulatory Visit: Payer: Self-pay

## 2024-09-19 ENCOUNTER — Ambulatory Visit: Payer: Self-pay | Admitting: Family Medicine

## 2024-09-19 VITALS — BP 132/86 | HR 107 | Temp 97.6°F | Ht 62.0 in | Wt 140.0 lb

## 2024-09-19 DIAGNOSIS — G44201 Tension-type headache, unspecified, intractable: Secondary | ICD-10-CM

## 2024-09-19 DIAGNOSIS — N1832 Chronic kidney disease, stage 3b: Secondary | ICD-10-CM | POA: Diagnosis not present

## 2024-09-19 DIAGNOSIS — R300 Dysuria: Secondary | ICD-10-CM | POA: Diagnosis not present

## 2024-09-19 DIAGNOSIS — R0602 Shortness of breath: Secondary | ICD-10-CM | POA: Diagnosis not present

## 2024-09-19 LAB — CBC WITH DIFFERENTIAL/PLATELET
Absolute Lymphocytes: 3765 {cells}/uL (ref 850–3900)
Absolute Monocytes: 686 {cells}/uL (ref 200–950)
Basophils Absolute: 42 {cells}/uL (ref 0–200)
Basophils Relative: 0.4 %
Eosinophils Absolute: 270 {cells}/uL (ref 15–500)
Eosinophils Relative: 2.6 %
HCT: 42.8 % (ref 35.9–46.0)
Hemoglobin: 14.1 g/dL (ref 11.7–15.5)
MCH: 29.3 pg (ref 27.0–33.0)
MCHC: 32.9 g/dL (ref 31.6–35.4)
MCV: 89 fL (ref 81.4–101.7)
MPV: 10.4 fL (ref 7.5–12.5)
Monocytes Relative: 6.6 %
Neutro Abs: 5637 {cells}/uL (ref 1500–7800)
Neutrophils Relative %: 54.2 %
Platelets: 238 Thousand/uL (ref 140–400)
RBC: 4.81 Million/uL (ref 3.80–5.10)
RDW: 13.1 % (ref 11.0–15.0)
Total Lymphocyte: 36.2 %
WBC: 10.4 Thousand/uL (ref 3.8–10.8)

## 2024-09-19 LAB — URINALYSIS, ROUTINE W REFLEX MICROSCOPIC
Bilirubin Urine: NEGATIVE
Glucose, UA: NEGATIVE
Hgb urine dipstick: NEGATIVE
Hyaline Cast: NONE SEEN /LPF
Ketones, ur: NEGATIVE
Nitrite: NEGATIVE
RBC / HPF: NONE SEEN /HPF (ref 0–2)
Specific Gravity, Urine: 1.02 (ref 1.001–1.035)
pH: 6.5 (ref 5.0–8.0)

## 2024-09-19 LAB — COMPREHENSIVE METABOLIC PANEL WITH GFR
AG Ratio: 1.8 (calc) (ref 1.0–2.5)
ALT: 15 U/L (ref 6–29)
AST: 16 U/L (ref 10–35)
Albumin: 4.5 g/dL (ref 3.6–5.1)
Alkaline phosphatase (APISO): 78 U/L (ref 37–153)
BUN/Creatinine Ratio: 20 (calc) (ref 6–22)
BUN: 28 mg/dL — ABNORMAL HIGH (ref 7–25)
CO2: 27 mmol/L (ref 20–32)
Calcium: 10 mg/dL (ref 8.6–10.4)
Chloride: 103 mmol/L (ref 98–110)
Creat: 1.41 mg/dL — ABNORMAL HIGH (ref 0.60–0.95)
Globulin: 2.5 g/dL (ref 1.9–3.7)
Glucose, Bld: 113 mg/dL — ABNORMAL HIGH (ref 65–99)
Potassium: 4.9 mmol/L (ref 3.5–5.3)
Sodium: 138 mmol/L (ref 135–146)
Total Bilirubin: 0.4 mg/dL (ref 0.2–1.2)
Total Protein: 7 g/dL (ref 6.1–8.1)
eGFR: 37 mL/min/1.73m2 — ABNORMAL LOW

## 2024-09-19 LAB — MICROSCOPIC MESSAGE

## 2024-09-19 MED ORDER — CEPHALEXIN 500 MG PO CAPS: 500.0000 mg | ORAL_CAPSULE | Freq: Four times a day (QID) | ORAL | 0 refills | Status: AC

## 2024-09-19 NOTE — Telephone Encounter (Signed)
Noted patient scheduled

## 2024-09-19 NOTE — Progress Notes (Addendum)
 "  Patient Office Visit  Assessment & Plan:  Dysuria -     Urinalysis, Routine w reflex microscopic -     Urine Culture; Future -     Cephalexin ; Take 1 capsule (500 mg total) by mouth 4 (four) times daily.  Dispense: 40 capsule; Refill: 0 -     Urinalysis, Routine w reflex microscopic -     Microscopic Message  Shortness of breath -     CBC with Differential/Platelet -     Comprehensive metabolic panel with GFR -     SARS-CoV-2 RNA, Influenza A/B, and RSV RNA, Qualitative NAAT  Acute intractable tension-type headache -     SARS-CoV-2 RNA, Influenza A/B, and RSV RNA, Qualitative NAAT  Chronic kidney disease, stage 3b (HCC) -     Comprehensive metabolic panel with GFR   Assessment and Plan    Urinary tract infection vs Sepsis Urinalysis confirmed UTI with WBCs and bacteria. Risk of sepsis due to age and history. - Sent urine for culture. - Prescribed antibiotics. - Monitor for signs of sepsis.  Acute intractable headache Severe headache, possibly sinus-related or due to dehydration. - Administer Excedrin for headache relief. - Monitor headache severity and response to treatment.  Shortness of breath Shortness of breath for a week, possibly due to respiratory infection or asthma. No wheezing noted. Flu more likely than COVID. - Monitor respiratory status. - Consider respiratory infection testing.  possible Dehydration Dehydration likely exacerbating headache and shortness of breath. Recent hospitalization for dehydration. - Administer oral fluids such as Gatorade or Pali. - Monitor hydration status.  Chronic kidney disease Abnormal kidney function with risk of worsening due to illness and dehydration. - Monitor kidney function. - Ensure adequate hydration.     Told patient and daughter that I recommend ER evaluation but pt/daughter declined and want to try outpatient therapy first. Daughter plans on taking patient to her home and pushing lots of fluids. Daughter is  aware that the respiratory panel will take 3-4 days to come back.  If worsening symptoms today patient will go to ER for further evaluation.  Return if symptoms worsen or fail to improve.   Subjective:    Patient ID: Elizabeth Mcintyre, female    DOB: Feb 08, 1941  Age: 84 y.o. MRN: [id number]  Chief Complaint  Patient presents with   Dysuria    Pt started with burning/painful urination on Saturday.   Shortness of Breath    No coughing. Pt states she feels SOB. Pt reports the SOB started over a week ago.    Dysuria   Shortness of Breath   Discussed the use of AI scribe software for clinical note transcription with the patient, who gave verbal consent to proceed.      History of Present Illness Elizabeth Mcintyre is an 84 year old female with a history of urinary tract infection, possible sepsis who presents with lower abdominal pain and difficulty urinating, She also mentions she has SOB and a headache. Per daughter it is difficult to assess the timing of her symptoms due to patient having dementia  She experiences severe abdominal pain, described as 'pretty horrible' and rated as 10 out of 10, primarily located in the suprapubic area started this past Saturday. The pain is associated with a burning sensation during urination. She has a history of urinary tract infections and was previously hospitalized for a UTI that led to sepsis this past September.  Daughter concerned and does not want this to happened again. She  did have a bowel movement today.   She has been experiencing headaches for several days, which are severe and affect her ability to sleep. She has taken Tylenol  for the headache, but it persists. She has not checked for COVID at home. Patient did not receive flu shot this season  She reports shortness of breath that has been present for about a week. She has a history of asthma but has not used her inhaler recently. Daughter was not aware of patient having SOB until today.   Her  appetite has decreased, and she reports not eating much, although she has been drinking fluids, including grapefruit juice and water , with the help of her son. She mentions feeling weak and has a history of dehydration, which previously led to a hospital admission last September  No fever, chills, nausea, vomiting, or recent exposure to sick individuals. She has a history of chronic kidney disease and abnormal kidney function. She also has a history of C. diff infection and respiratory infections.  Physical Exam ABDOMEN: Tenderness in the mid-abdomen, non-tender in the upper abdomen.  Results Labs Kidney function (07/2024): Abnormal  Assessment and Plan Urinary tract infection vs Sepsis Urinalysis confirmed UTI with WBCs and bacteria. Risk of sepsis due to age and history. - Sent urine for culture. - Prescribed antibiotics. - Monitor for signs of sepsis and if worsening daughter knows to take her to ER  Acute intractable headache Severe headache, possibly sinus-related or due to dehydration. - Administer Excedrin for headache relief. - Monitor headache severity and response to treatment.  Shortness of breath ? etiology Shortness of breath for a week, possibly due to respiratory infection or asthma. No wheezing noted. Flu more likely than COVID. - Monitor respiratory status. - Consider respiratory infection testing. This was ordered.   possible Dehydration Dehydration likely exacerbating headache and shortness of breath. Recent hospitalization for dehydration. - Administer oral fluids such as Gatorade or Pali. - Monitor hydration status.  Chronic kidney disease Abnormal kidney function with risk of worsening due to illness and dehydration. - Monitor kidney function. - Ensure adequate hydration.     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   Cancer 2021   Lymphoma   Carotid artery stenosis 06/13/2016   Chronic SI joint pain    was on tramadol    Diverticulosis 03/2011   Dysphagia 04/18/2020   Essential hypertension, benign 11/09/2007   Current med losartan  100 mg qd, amlodopine 10 mg qd   Fall 07/22/2021   Fecal incontinence 07/07/2019   Gastroesophageal reflux disease with esophagitis 03/06/2015   Gout 06/09/2014   R great toe, ball of foot Stopped HCTZ several mos ago,  On losartan         Hematemesis 03/30/2020   Hepatic steatosis 01/04/2020   Herpes zoster without complication 12/02/2022   History of rheumatoid arthritis    during 30's, was treated.   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    Stress incontinence    Thrombocytopenia 01/04/2020   Type II diabetes mellitus 11/09/2007   diet control   Vitamin D  deficiency 05/25/2015   Past Surgical History:  Procedure Laterality Date   ABDOMINAL HYSTERECTOMY     BALLOON DILATION N/A 08/21/2020    Procedure: BALLOON DILATION;  Surgeon: Cindie Carlin POUR, DO;  Location: [facility];  Service: Endoscopy;  Laterality: N/A;   BIOPSY  03/30/2020   Procedure: BIOPSY;  Surgeon: Donnald Charleston, MD;  Location: [facility];  Service: Endoscopy;;   BIOPSY  05/02/2020   Procedure: BIOPSY;  Surgeon: Eartha Flavors, Toribio, MD;  Location: [facility];  Service: Gastroenterology;;   CARDIAC CATHETERIZATION     X 2, last one in 1998   CHOLECYSTECTOMY N/A 04/13/2017   Procedure: LAPAROSCOPIC CHOLECYSTECTOMY;  Surgeon: Mavis Anes, MD;  Location: [facility];  Service: General;  Laterality: N/A;   COLONOSCOPY     COLONOSCOPY  May 2012   Dr. Obie: mild diverticulosis, otherwise normal.    COLONOSCOPY WITH PROPOFOL  N/A 05/02/2020   Dr. Eartha: 8 mm polyp removed from the ascending colon, 2 mm polyp removed from the ascending colon.  Tubular adenomas.  Diverticulosis.  Mucosal ulceration in the sigmoid colon noted, pathology consistent with ischemic colitis.   ESOPHAGOGASTRODUODENOSCOPY N/A 01/28/2015   Dr. Shaaron: reflux esophagitis, Schatzki's ring not manipulated due to recent bleeding   ESOPHAGOGASTRODUODENOSCOPY N/A 03/30/2015   Dr. Shaaron: Schatzki's ring s/p Agapito dilation, previously noted esophageal ulcer completely healed   ESOPHAGOGASTRODUODENOSCOPY N/A 03/30/2020   Buccini: Moderately severe erosive, circumferential, confluent esophagitis with no bleeding found 25 to 40 cm from incisors.  Nonobstructing and mild Schatzki ring, there were also multiple distal esophageal rings noted, minimal hiatal hernia.   ESOPHAGOGASTRODUODENOSCOPY (EGD) WITH PROPOFOL  N/A 08/21/2020   Procedure: ESOPHAGOGASTRODUODENOSCOPY (EGD) WITH PROPOFOL ;  Surgeon: Cindie Carlin POUR, DO;  [facility]: [facility];  Service: Endoscopy;  Laterality: N/A;  2:15pm   IR IMAGING GUIDED PORT INSERTION  03/13/2020   IR REMOVAL TUN ACCESS W/ PORT W/O FL MOD SED  10/01/2021   LYMPH NODE BIOPSY Left 03/20/2020   Procedure: LEFT  POSTERIOR CERVICAL LYMPH NODE BIOPSY;  Surgeon: Sebastian Moles, MD;  [facility];  Service: General;  Laterality: Left;   MALONEY DILATION N/A 03/30/2015   Procedure: AGAPITO HODGKIN;  Surgeon: Charleston CHRISTELLA Shaaron, MD;  [facility];  Service: Endoscopy;  Laterality: N/A;   POLYPECTOMY  05/02/2020   Procedure: POLYPECTOMY;  Surgeon: Eartha Flavors Toribio, MD;  [facility];  Service: Gastroenterology;;   Social History[1] Family History  Problem Relation Age of Onset   Heart disease Mother    Osteoarthritis Mother    Sudden death Father    Single kidney Father    Other Father        h/o severe MVA injuries   Hyperlipidemia Sister    Other Daughter  Myalgias   Fibromyalgia Daughter    Allergies Daughter    Heart disease Maternal Grandfather    Sudden death Paternal Grandmother    Diabetes Paternal Grandfather    Heart disease Daughter    Other Daughter        palpitations   Pulmonary fibrosis Maternal Aunt    Cancer Paternal Uncle    Pulmonary fibrosis Maternal Aunt    Colon cancer Neg Hx    Allergies[2]  Review of Systems  Respiratory:  Positive for shortness of breath.   Genitourinary:  Positive for dysuria.      Objective:    BP 132/86   Pulse (!) 107   Temp 97.6 F (36.4 C)   Ht 5' 2 (1.575 m)   Wt 140 lb (63.5 kg)   SpO2 97%   BMI 25.61 kg/m  BP Readings from Last 3 Encounters:  09/19/24 132/86  07/28/24 121/60  06/07/24 133/79   Wt Readings from Last 3 Encounters:  09/19/24 140 lb (63.5 kg)  07/28/24 138 lb 8 oz (62.8 kg)  06/07/24 135 lb 6.4 oz (61.4 kg)    Physical Exam Vitals and nursing note reviewed.  Constitutional:      Appearance: She is ill-appearing.     Comments: Comes in with her daughter. Patient is pointing to her lower abdomen and forehead  HENT:     Head: Normocephalic.     Right Ear: Tympanic membrane, ear canal and external ear normal.     Left Ear: Tympanic membrane, ear canal and external ear  normal.  Eyes:     Extraocular Movements: Extraocular movements intact.     Pupils: Pupils are equal, round, and reactive to light.  Cardiovascular:     Rate and Rhythm: Regular rhythm. Tachycardia present.     Heart sounds: Normal heart sounds.  Pulmonary:     Effort: Pulmonary effort is normal.     Breath sounds: Normal breath sounds. No wheezing or rhonchi.  Abdominal:     General: Bowel sounds are increased.     Palpations: Abdomen is soft.     Tenderness: There is abdominal tenderness in the suprapubic area. There is no left CVA tenderness or rebound.  Musculoskeletal:     Right lower leg: No edema.     Left lower leg: No edema.  Neurological:     General: No focal deficit present.     Mental Status: She is alert and oriented to person, place, and time.  Psychiatric:        Mood and Affect: Mood normal.        Behavior: Behavior normal.      Results for orders placed or performed in visit on 09/19/24  CBC with Differential/Platelet  Result Value Ref Range   WBC 10.4 3.8 - 10.8 Thousand/uL   RBC 4.81 3.80 - 5.10 Million/uL   Hemoglobin 14.1 11.7 - 15.5 g/dL   HCT 57.1 64.0 - 53.9 %   MCV 89.0 81.4 - 101.7 fL   MCH 29.3 27.0 - 33.0 pg   MCHC 32.9 31.6 - 35.4 g/dL   RDW 86.8 88.9 - 84.9 %   Platelets 238 140 - 400 Thousand/uL   MPV 10.4 7.5 - 12.5 fL   Neutro Abs 5,637 1,500 - 7,800 cells/uL   Absolute Lymphocytes 3,765 850 - 3,900 cells/uL   Absolute Monocytes 686 200 - 950 cells/uL   Eosinophils Absolute 270 15 - 500 cells/uL   Basophils Absolute 42 0 - 200 cells/uL   Neutrophils  Relative % 54.2 %   Total Lymphocyte 36.2 %   Monocytes Relative 6.6 %   Eosinophils Relative 2.6 %   Basophils Relative 0.4 %  Comprehensive metabolic panel with GFR  Result Value Ref Range   Glucose, Bld 113 (H) 65 - 99 mg/dL   BUN 28 (H) 7 - 25 mg/dL   Creat 8.58 (H) 9.39 - 0.95 mg/dL   eGFR 37 (L) > OR = 60 mL/min/1.72m2   BUN/Creatinine Ratio 20 6 - 22 (calc)   Sodium 138 135  - 146 mmol/L   Potassium 4.9 3.5 - 5.3 mmol/L   Chloride 103 98 - 110 mmol/L   CO2 27 20 - 32 mmol/L   Calcium  10.0 8.6 - 10.4 mg/dL   Total Protein 7.0 6.1 - 8.1 g/dL   Albumin 4.5 3.6 - 5.1 g/dL   Globulin 2.5 1.9 - 3.7 g/dL (calc)   AG Ratio 1.8 1.0 - 2.5 (calc)   Total Bilirubin 0.4 0.2 - 1.2 mg/dL   Alkaline phosphatase (APISO) 78 37 - 153 U/L   AST 16 10 - 35 U/L   ALT 15 6 - 29 U/L  Urinalysis, Routine w reflex microscopic  Result Value Ref Range   Color, Urine YELLOW YELLOW   APPearance CLEAR CLEAR   Specific Gravity, Urine 1.020 1.001 - 1.035   pH 6.5 5.0 - 8.0   Glucose, UA NEGATIVE NEGATIVE   Bilirubin Urine NEGATIVE NEGATIVE   Ketones, ur NEGATIVE NEGATIVE   Hgb urine dipstick NEGATIVE NEGATIVE   Protein, ur TRACE (A) NEGATIVE   Nitrite NEGATIVE NEGATIVE   Leukocytes,Ua 1+ (A) NEGATIVE   WBC, UA 10-20 (A) 0 - 5 /HPF   RBC / HPF NONE SEEN 0 - 2 /HPF   Squamous Epithelial / HPF 0-5 < OR = 5 /HPF   Bacteria, UA FEW (A) NONE SEEN /HPF   Hyaline Cast NONE SEEN NONE SEEN /LPF  Microscopic Message  Result Value Ref Range   Note              [1]  Social History Tobacco Use   Smoking status: Never   Smokeless tobacco: Never  Vaping Use   Vaping status: Never Used  Substance Use Topics   Alcohol use: Never   Drug use: Never  [2]  Allergies Allergen Reactions   Codeine Nausea And Vomiting and Rash   "

## 2024-09-19 NOTE — Telephone Encounter (Signed)
 FYI Only or Action Required?: FYI only for provider: appointment scheduled on 09/19/24 at alternate office within region.  Patient was last seen in primary care on 07/28/2024 by [name], MD.  Called Nurse Triage reporting Dysuria and Fatigue.  Symptoms began several days ago.  Interventions attempted: Nothing.  Symptoms are: unchanged.  Triage Disposition: See HCP Within 4 Hours (Or PCP Triage)  Patient/caregiver understands and will follow disposition?: Yes                                  1. SEVERITY: How bad is the pain?  (e.g., Scale 1-10; mild, moderate, or severe)     Daughter estimates moderate, but reports patient tends to alter her pain level 4. ONSET: When did the painful urination start?      Saturday 5. FEVER: Do you have a fever? If Yes, ask: What is your temperature, how was it measured, and when did it start?     Denies fever, denies chills when asked by daughter 72. PAST UTI: Have you had a urine infection before? If Yes, ask: When was the last time? and What happened that time?      Yes, daughter reports she ended up in the hospital  7. CAUSE: What do you think is causing the painful urination?  (e.g., UTI, scratch, Herpes sore)     Wearing depends too long 8. OTHER SYMPTOMS: Do you have any other symptoms? (e.g., blood in urine, flank pain, genital sores, urgency, vaginal discharge)     Weakness, urinary frequency Denies vomiting Per daughter, brother has not reported passing out or losing consciousness     This RN spoke to patient's daughter, Sari (on [place]). Per daughter, patient has advanced dementia and would not respond well to a phone call from this RN. Patient lives with her son, the caller's brother. This RN advised in-person evaluation today. No availability with PCP office. This RN scheduled patient for same day appointment at alternate office within region. This RN advised daughter to take patient to  ED immediately if she appears feverish or severely weak, upon arrival. Daughter verbalized understanding.   Reason for Disposition  Diabetes mellitus or weak immune system (e.g., HIV positive, cancer chemo, splenectomy, organ transplant, chronic steroids)  Protocols used: Urination Pain - [facility]  Copied from CRM [id number]. Topic: Clinical - Red Word Triage >> Sep 19, 2024 11:00 AM Delon HERO wrote: Red Word that prompted transfer to Nurse Triage: Patient's daughter is calling to report that the patient is having UTI symptoms with burning with urination. Patient reporting that she passed out. However daughter reporting mother said this was a month ago. Daughter reporting patient has dementia and no sure exactly that the patient passed out. Please advise

## 2024-09-20 LAB — URINE CULTURE
MICRO NUMBER:: 17425820
SPECIMEN QUALITY:: ADEQUATE

## 2024-09-20 NOTE — Progress Notes (Signed)
 Tried to call pt daughter, Sari, to check on pt. No answer. LVM.

## 2024-09-21 LAB — SARS-COV-2 RNA, INFLUENZA A/B, AND RSV RNA, QUALITATIVE NAAT
INFLUENZA A RNA: NOT DETECTED
INFLUENZA B RNA: NOT DETECTED
RSV RNA: NOT DETECTED
SARS COV2 RNA: NOT DETECTED

## 2024-10-18 ENCOUNTER — Ambulatory Visit

## 2024-10-19 ENCOUNTER — Ambulatory Visit

## 2024-10-19 VITALS — BP 120/80 | HR 79 | Resp 16 | Ht 62.0 in | Wt 143.1 lb

## 2024-10-19 DIAGNOSIS — Z780 Asymptomatic menopausal state: Secondary | ICD-10-CM

## 2024-10-19 DIAGNOSIS — Z1231 Encounter for screening mammogram for malignant neoplasm of breast: Secondary | ICD-10-CM

## 2024-10-19 DIAGNOSIS — Z0000 Encounter for general adult medical examination without abnormal findings: Secondary | ICD-10-CM | POA: Diagnosis not present

## 2024-10-19 NOTE — Patient Instructions (Signed)
 Ms. Beed,  Thank you for taking the time for your Medicare Wellness Visit. I appreciate your continued commitment to your health goals. Please review the care plan we discussed, and feel free to reach out if I can assist you further.  Please note that Annual Wellness Visits do not include a physical exam. Some assessments may be limited, especially if the visit was conducted virtually. If needed, we may recommend an in-person follow-up with your provider.  Ongoing Care Seeing your primary care provider every 3 to 6 months helps us  monitor your health and provide consistent, personalized care.   Referrals If a referral was made during today's visit and you haven't received any updates within two weeks, please contact the referred provider directly to check on the status.  Mammogram/Bone Density Screening: Call [facility] @ Phone: [phone]   Recommended Screenings:  Health Maintenance  Topic Date Due   Zoster (Shingles) Vaccine (1 of 2) Never done   Osteoporosis screening with Bone Density Scan  05/08/2021   Medicare Annual Wellness Visit  06/04/2022   Breast Cancer Screening  06/28/2023   Eye exam for diabetics  07/17/2023   COVID-19 Vaccine (4 - 2025-26 season) 05/16/2024   Flu Shot  12/13/2024*   Kidney health urinalysis for diabetes  01/25/2025   Hemoglobin A1C  01/25/2025   Lipid (cholesterol) test  07/28/2025   Yearly kidney function blood test for diabetes  09/19/2025   DTaP/Tdap/Td vaccine (3 - Td or Tdap) 07/11/2032   Pneumococcal Vaccine for age over 56  Completed   Meningitis B Vaccine  Aged Out   Complete foot exam   Discontinued   Hepatitis C Screening  Discontinued  *Topic was postponed. The date shown is not the original due date.       10/19/2024    2:22 PM  Advanced Directives  Does Patient Have a Medical Advance Directive? No  Would patient like information on creating a medical advance directive? Yes (MAU/Ambulatory/Procedural Areas -  Information given)    Vision: Annual vision screenings are recommended for early detection of glaucoma, cataracts, and diabetic retinopathy. These exams can also reveal signs of chronic conditions such as diabetes and high blood pressure.  Dental: Annual dental screenings help detect early signs of oral cancer, gum disease, and other conditions linked to overall health, including heart disease and diabetes.  Please see the attached documents for additional preventive care recommendations.

## 2024-10-19 NOTE — Progress Notes (Signed)
 "  Chief Complaint  Patient presents with   Medicare Wellness     Subjective:   Elizabeth Mcintyre is a 84 y.o. female who presents for a Medicare Annual Wellness Visit.  Visit info / Clinical Intake: Medicare Wellness Visit Type:: Subsequent Annual Wellness Visit Persons participating in visit and providing information:: patient & caregiver Medicare Wellness Visit Mode:: In-person (required for WTM) Interpreter Needed?: No Pre-visit prep was completed: yes AWV questionnaire completed by patient prior to visit?: no Living arrangements:: with family/others Patient's Overall Health Status Rating: good Typical amount of pain: none Does pain affect daily life?: no Are you currently prescribed opioids?: no  Dietary Habits and Nutritional Risks How many meals a day?: 3 Eats fruit and vegetables daily?: yes Most meals are obtained by: preparing own meals In the last 2 weeks, have you had any of the following?: none Diabetic:: no  Functional Status Activities of Daily Living (to include ambulation/medication): Independent Ambulation: Independent Medication Administration: Dependent Is this a change from baseline?: Pre-admission baseline Home Management (perform basic housework or laundry): Needs assistance (comment) Manage your own finances?: (!) no Primary transportation is: family / friends Concerns about vision?: no *vision screening is required for WTM* Concerns about hearing?: no  Fall Screening Falls in the past year?: 0 Number of falls in past year: 0 Was there an injury with Fall?: 0 Fall Risk Category Calculator: 0 Patient Fall Risk Level: Low Fall Risk  Fall Risk Patient at Risk for Falls Due to: No Fall Risks Fall risk Follow up: Falls evaluation completed; Education provided; Falls prevention discussed  Home and Transportation Safety: All rugs have non-skid backing?: yes All stairs or steps have railings?: yes Grab bars in the bathtub or shower?: yes Have  non-skid surface in bathtub or shower?: yes Good home lighting?: yes Regular seat belt use?: yes Hospital stays in the last year:: (!) yes How many hospital stays:: 2 Reason: atypical chest pain, sepsis  Cognitive Assessment Difficulty concentrating, remembering, or making decisions? : yes Will 6CIT or Mini Cog be Completed: yes What year is it?: 0 points What month is it?: 0 points Give patient an address phrase to remember (5 components): [address] [facility] [place] About what time is it?: 0 points Count backwards from 20 to 1: 0 points Say the months of the year in reverse: 0 points  Advance Directives (For Healthcare) Does Patient Have a Medical Advance Directive?: No Would patient like information on creating a medical advance directive?: Yes (MAU/Ambulatory/Procedural Areas - Information given)  Reviewed/Updated  Reviewed/Updated: Reviewed All (Medical, Surgical, Family, Medications, Allergies, Care Teams, Patient Goals)    Allergies (verified) Codeine   Current Medications (verified) Outpatient Encounter Medications as of 10/19/2024  Medication Sig   acetaminophen  (TYLENOL ) 325 MG tablet Take 325 mg by mouth every 6 (six) hours as needed for moderate pain.    albuterol  (VENTOLIN  HFA) 108 (90 Base) MCG/ACT inhaler Inhale 2 puffs into the lungs every 6 (six) hours as needed for wheezing or shortness of breath.   amLODipine  (NORVASC ) 10 MG tablet Take 1 tablet (10 mg total) by mouth daily.   Ascorbic Acid (VITAMIN C ) 100 MG tablet Take 100 mg by mouth daily.   carvedilol  (COREG ) 3.125 MG tablet Take 1 tablet (3.125 mg total) by mouth 2 (two) times daily with a meal.   cephALEXin  (KEFLEX ) 500 MG capsule Take 1 capsule (500 mg total) by mouth 4 (four) times daily.   cetirizine (ZYRTEC) 10 MG tablet Take 10 mg  by mouth at bedtime. As needed   Cholecalciferol  (VITAMIN D3) 25 MCG (1000 UT) CAPS Take 1,000 Units by mouth daily.    donepezil  (ARICEPT ) 10 MG tablet Take 1 tablet (10 mg  total) by mouth daily.   famotidine  (PEPCID ) 40 MG tablet Take 1 tablet (40 mg total) by mouth daily.   levothyroxine  (SYNTHROID ) 88 MCG tablet Take 1 tablet (88 mcg total) by mouth daily before breakfast.   lidocaine  (LIDODERM ) 5 % Place 1 patch onto the skin daily. Remove & Discard patch within 12 hours or as directed by MD   mirtazapine  (REMERON ) 15 MG tablet Take 1 tablet (15 mg total) by mouth at bedtime.   rOPINIRole  (REQUIP ) 0.25 MG tablet Take 0.25 mg by mouth at bedtime.   rosuvastatin  (CRESTOR ) 10 MG tablet Take 1 tablet (10 mg total) by mouth daily.   sucralfate  (CARAFATE ) 1 g tablet TAKE 1 TABLET BY MOUTH 4 TIMES DAILY.   vitamin B-12 (CYANOCOBALAMIN ) 1000 MCG tablet Take 1,000 mcg by mouth daily.    No facility-administered encounter medications on file as of 10/19/2024.    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   Cancer 2021   Lymphoma   Carotid artery stenosis 06/13/2016   Chronic SI joint pain    was on tramadol    Diverticulosis 03/2011   Dysphagia 04/18/2020   Essential hypertension, benign 11/09/2007   Current med losartan  100 mg qd, amlodopine 10 mg qd   Fall 07/22/2021   Fecal incontinence 07/07/2019   Gastroesophageal reflux disease with esophagitis 03/06/2015   Gout 06/09/2014   R great toe, ball of foot Stopped HCTZ several mos ago,  On losartan         Hematemesis 03/30/2020   Hepatic steatosis 01/04/2020   Herpes zoster without complication 12/02/2022   History of rheumatoid arthritis    during 30's, was treated.   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    Stress incontinence    Thrombocytopenia 01/04/2020   Type II diabetes mellitus 11/09/2007   diet control   Vitamin D  deficiency 05/25/2015   Past Surgical History:  Procedure Laterality Date   ABDOMINAL HYSTERECTOMY     BALLOON DILATION N/A 08/21/2020   Procedure: BALLOON DILATION;  Surgeon: Cindie Carlin POUR, DO;  Location: [facility];  Service: Endoscopy;  Laterality: N/A;   BIOPSY  03/30/2020   Procedure: BIOPSY;  Surgeon: Donnald Charleston, MD;  Location: [facility];  Service: Endoscopy;;   BIOPSY  05/02/2020   Procedure: BIOPSY;  Surgeon: Eartha Angelia Sieving, MD;  Location: [facility];  Service: Gastroenterology;;   CARDIAC CATHETERIZATION     X 2, last one in 1998   CHOLECYSTECTOMY N/A 04/13/2017   Procedure: LAPAROSCOPIC CHOLECYSTECTOMY;  Surgeon: Mavis Anes, MD;  Location: [facility];  Service: General;  Laterality: N/A;   COLONOSCOPY     COLONOSCOPY  May 2012   Dr. Obie: mild diverticulosis, otherwise normal.    COLONOSCOPY WITH PROPOFOL  N/A 05/02/2020   Dr. Eartha: 8 mm polyp removed from the ascending colon, 2 mm polyp removed from the ascending colon.  Tubular adenomas.  Diverticulosis.  Mucosal ulceration in the sigmoid colon noted, pathology consistent with ischemic colitis.   ESOPHAGOGASTRODUODENOSCOPY N/A 01/28/2015   Dr. Shaaron: reflux esophagitis, Schatzki's ring not manipulated due to recent bleeding   ESOPHAGOGASTRODUODENOSCOPY N/A  03/30/2015   Dr. Shaaron: Schatzki's ring s/p Agapito dilation, previously noted esophageal ulcer completely healed   ESOPHAGOGASTRODUODENOSCOPY N/A 03/30/2020   Buccini: Moderately severe erosive, circumferential, confluent esophagitis with no bleeding found 25 to 40 cm from incisors.  Nonobstructing and mild Schatzki ring, there were also multiple distal esophageal rings noted, minimal hiatal hernia.   ESOPHAGOGASTRODUODENOSCOPY (EGD) WITH PROPOFOL  N/A 08/21/2020   Procedure: ESOPHAGOGASTRODUODENOSCOPY (EGD) WITH PROPOFOL ;  Surgeon: Cindie Carlin POUR, DO;  [facility];  Service: Endoscopy;  Laterality: N/A;  2:15pm   IR IMAGING GUIDED PORT INSERTION  03/13/2020   IR REMOVAL TUN ACCESS W/ PORT W/O FL MOD SED  10/01/2021   LYMPH NODE BIOPSY Left 03/20/2020   Procedure: LEFT POSTERIOR CERVICAL LYMPH NODE BIOPSY;  Surgeon: Sebastian Moles, MD;  Location: [facility];  Service: General;  Laterality: Left;   MALONEY DILATION N/A 03/30/2015   Procedure: AGAPITO HODGKIN;  Surgeon: Lamar CHRISTELLA Shaaron, MD;  [facility];  Service: Endoscopy;  Laterality: N/A;   POLYPECTOMY  05/02/2020   Procedure: POLYPECTOMY;  Surgeon: Eartha Flavors, Toribio, MD;  [facility];  Service: Gastroenterology;;   Family History  Problem Relation Age of Onset   Heart disease Mother    Osteoarthritis Mother    Sudden death Father    Single kidney Father    Other Father        h/o severe MVA injuries   Hyperlipidemia Sister    Other Daughter        Myalgias   Fibromyalgia Daughter    Allergies Daughter    Heart disease Maternal Grandfather    Sudden death Paternal Grandmother    Diabetes Paternal Grandfather    Heart disease Daughter    Other Daughter        palpitations   Pulmonary fibrosis Maternal Aunt    Cancer Paternal Uncle    Pulmonary fibrosis Maternal Aunt    Colon cancer Neg Hx    Social History   Occupational History   Occupation: Retired    Comment: Print production planner  Tobacco  Use   Smoking status: Never   Smokeless tobacco: Never  Vaping Use   Vaping status: Never Used  Substance and Sexual Activity   Alcohol use: Never   Drug use: Never   Sexual activity: Never   Tobacco Counseling Counseling given: Yes  SDOH Screenings   Food Insecurity: No Food Insecurity (10/19/2024)  Housing: Low Risk (10/19/2024)  Transportation Needs: No Transportation Needs (10/19/2024)  Utilities: Not At Risk (10/19/2024)  Depression (PHQ2-9): Medium Risk (10/19/2024)  Physical Activity: Sufficiently Active (10/19/2024)  Social Connections: Moderately Isolated (10/19/2024)  Stress: No Stress Concern Present (10/19/2024)  Tobacco Use: Low Risk (10/19/2024)  Health Literacy:  Adequate Health Literacy (10/19/2024)   See flowsheets for full screening details  Depression Screen Depression Screening Exception Documentation Depression Screening Exception:: Other- indicate reason in comment box Depression Screening Exception Comment:: Unable to assess, daughter if informant and not with patient during this call.  PHQ 2 & 9 Depression Scale- Over the past 2 weeks, how often have you been bothered by any of the following problems? Little interest or pleasure in doing things: 1 Feeling down, depressed, or hopeless (PHQ Adolescent also includes...irritable): 1 PHQ-2 Total Score: 2 Trouble falling or staying asleep, or sleeping too much: 0 Feeling tired or having little energy: 1 Poor appetite or overeating (PHQ Adolescent also includes...weight loss): 3 Feeling bad about yourself - or that you are a failure or have let yourself or your family down: 0 Trouble concentrating on things, such as reading the newspaper or watching television (PHQ Adolescent also includes...like school work): 0 Moving or speaking so slowly that other people could have noticed. Or the opposite - being so fidgety or restless that you have been moving around a lot more than usual: 0 Thoughts that you would be better off dead, or  of hurting yourself in some way: 0 PHQ-9 Total Score: 6 If you checked off any problems, how difficult have these problems made it for you to do your work, take care of things at home, or get along with other people?: Not difficult at all  Depression Treatment Depression Interventions/Treatment : Patient refuses Treatment     Goals Addressed               This Visit's Progress     remain as active and healthy and independent as possible (pt-stated)               Objective:    Today's Vitals   10/19/24 1411  BP: 120/80  Pulse: 79  Resp: 16  SpO2: 98%  Weight: 143 lb 1.9 oz (64.9 kg)  Height: 5' 2 (1.575 m)   Body mass index is 26.18 kg/m.  Hearing/Vision screen Hearing Screening - Comments:: Patient denies any hearing difficulties. Vision Screening - Comments:: Patient is not up to date on yearly eye exams.  She will call a provider close to her home to establish  Immunizations and Health Maintenance Health Maintenance  Topic Date Due   Zoster Vaccines- Shingrix (1 of 2) Never done   Bone Density Scan  05/08/2021   Mammogram  06/28/2023   OPHTHALMOLOGY EXAM  07/17/2023   COVID-19 Vaccine (4 - 2025-26 season) 05/16/2024   Influenza Vaccine  12/13/2024 (Originally 04/15/2024)   Diabetic kidney evaluation - Urine ACR  01/25/2025   HEMOGLOBIN A1C  01/25/2025   LIPID PANEL  07/28/2025   Diabetic kidney evaluation - eGFR measurement  09/19/2025   Medicare Annual Wellness (AWV)  10/19/2025   DTaP/Tdap/Td (3 - Td or Tdap) 07/11/2032   Pneumococcal Vaccine: 50+ Years  Completed   Meningococcal B Vaccine  Aged Out   FOOT EXAM  Discontinued   Hepatitis C Screening  Discontinued        Assessment/Plan:  This is a routine wellness examination for Shatori.  Patient Care Team: [name], MD as PCP - General (Internal Medicine) Shirlean Therisa ORN, NP (Gastroenterology) Cindie Carlin POUR, DO as Consulting Physician (Internal Medicine) Georjean Darice HERO, MD as  Consulting Physician (Neurology) [name], MD (Internal Medicine) Lavona Agent, MD as Consulting Physician (Cardiology)  I have personally reviewed and noted the following in the patients  chart:   Medical and social history Use of alcohol, tobacco or illicit drugs  Current medications and supplements including opioid prescriptions. Functional ability and status Nutritional status Physical activity Advanced directives List of other physicians Hospitalizations, surgeries, and ER visits in previous 12 months Vitals Screenings to include cognitive, depression, and falls Referrals and appointments  Orders Placed This Encounter  Procedures   DG Bone Density    Standing Status:   Future    Expiration Date:   10/19/2025    Reason for Exam (SYMPTOM  OR DIAGNOSIS REQUIRED):   post menopausal estrogen deficient    Preferred imaging location?:   [facility]   MM 3D SCREENING MAMMOGRAM BILATERAL BREAST    Standing Status:   Future    Expiration Date:   10/19/2025    Reason for Exam (SYMPTOM  OR DIAGNOSIS REQUIRED):   breast cancer screening    Preferred imaging location?:   [facility]   In addition, I have reviewed and discussed with patient certain preventive protocols, quality metrics, and best practice recommendations. A written personalized care plan for preventive services as well as general preventive health recommendations were provided to patient.   Elfreda Blanchet, CMA   10/19/2024   Return October 20, 2025 at 1:10 pm, for In office Medicare Well Visit w  Wellness Nurse.  After Visit Summary: (In Person-Declined) Patient declined AVS at this time.   "

## 2024-10-28 ENCOUNTER — Ambulatory Visit (HOSPITAL_COMMUNITY)

## 2024-10-28 ENCOUNTER — Other Ambulatory Visit (HOSPITAL_COMMUNITY)

## 2024-11-28 ENCOUNTER — Ambulatory Visit: Admitting: Internal Medicine

## 2025-10-20 ENCOUNTER — Ambulatory Visit: Payer: Self-pay
